# Patient Record
Sex: Female | Born: 1940
Health system: Southern US, Community
[De-identification: ages and names within clinical notes are randomized; demographics above are authoritative.]

## PROBLEM LIST (undated history)

## (undated) DIAGNOSIS — H401133 Primary open-angle glaucoma, bilateral, severe stage: Secondary | ICD-10-CM

## (undated) DIAGNOSIS — G5603 Carpal tunnel syndrome, bilateral upper limbs: Secondary | ICD-10-CM

## (undated) DIAGNOSIS — Q600 Renal agenesis, unilateral: Secondary | ICD-10-CM

## (undated) DIAGNOSIS — M353 Polymyalgia rheumatica: Secondary | ICD-10-CM

## (undated) DIAGNOSIS — K219 Gastro-esophageal reflux disease without esophagitis: Secondary | ICD-10-CM

## (undated) DIAGNOSIS — Z923 Personal history of irradiation: Secondary | ICD-10-CM

## (undated) DIAGNOSIS — N189 Chronic kidney disease, unspecified: Secondary | ICD-10-CM

## (undated) DIAGNOSIS — M1990 Unspecified osteoarthritis, unspecified site: Secondary | ICD-10-CM

## (undated) DIAGNOSIS — C801 Malignant (primary) neoplasm, unspecified: Secondary | ICD-10-CM

## (undated) DIAGNOSIS — L409 Psoriasis, unspecified: Secondary | ICD-10-CM

## (undated) DIAGNOSIS — R519 Headache, unspecified: Secondary | ICD-10-CM

## (undated) DIAGNOSIS — G4730 Sleep apnea, unspecified: Secondary | ICD-10-CM

## (undated) DIAGNOSIS — I739 Peripheral vascular disease, unspecified: Secondary | ICD-10-CM

## (undated) DIAGNOSIS — R112 Nausea with vomiting, unspecified: Secondary | ICD-10-CM

## (undated) DIAGNOSIS — E7800 Pure hypercholesterolemia, unspecified: Secondary | ICD-10-CM

## (undated) DIAGNOSIS — I10 Essential (primary) hypertension: Secondary | ICD-10-CM

## (undated) DIAGNOSIS — F419 Anxiety disorder, unspecified: Secondary | ICD-10-CM

## (undated) DIAGNOSIS — R011 Cardiac murmur, unspecified: Secondary | ICD-10-CM

## (undated) DIAGNOSIS — S81832A Puncture wound without foreign body, left lower leg, initial encounter: Secondary | ICD-10-CM

## (undated) DIAGNOSIS — R45850 Homicidal ideations: Secondary | ICD-10-CM

## (undated) DIAGNOSIS — F32A Depression, unspecified: Secondary | ICD-10-CM

## (undated) DIAGNOSIS — F329 Major depressive disorder, single episode, unspecified: Secondary | ICD-10-CM

## (undated) DIAGNOSIS — R51 Headache: Secondary | ICD-10-CM

## (undated) HISTORY — DX: Polymyalgia rheumatica: M35.3

## (undated) HISTORY — PX: APPENDECTOMY: SHX54

## (undated) HISTORY — PX: OTHER SURGICAL HISTORY: SHX169

## (undated) HISTORY — PX: ILIAC ARTERY STENT: SHX1786

## (undated) HISTORY — DX: Puncture wound without foreign body, left lower leg, initial encounter: S81.832A

## (undated) HISTORY — PX: TONSILLECTOMY: SUR1361

## (undated) HISTORY — PX: EYE SURGERY: SHX253

## (undated) HISTORY — DX: Homicidal ideations: R45.850

---

## 1961-07-10 HISTORY — PX: ABDOMINAL HYSTERECTOMY: SHX81

## 1988-07-10 HISTORY — PX: CARPAL TUNNEL RELEASE: SHX101

## 1999-05-16 ENCOUNTER — Encounter: Payer: Self-pay | Admitting: Emergency Medicine

## 1999-05-16 ENCOUNTER — Emergency Department (HOSPITAL_COMMUNITY): Admission: EM | Admit: 1999-05-16 | Discharge: 1999-05-16 | Payer: Self-pay | Admitting: Emergency Medicine

## 1999-10-04 ENCOUNTER — Emergency Department (HOSPITAL_COMMUNITY): Admission: EM | Admit: 1999-10-04 | Discharge: 1999-10-04 | Payer: Self-pay | Admitting: Emergency Medicine

## 1999-10-05 ENCOUNTER — Inpatient Hospital Stay (HOSPITAL_COMMUNITY): Admission: EM | Admit: 1999-10-05 | Discharge: 1999-10-08 | Payer: Self-pay | Admitting: Orthopedic Surgery

## 1999-10-05 ENCOUNTER — Encounter: Payer: Self-pay | Admitting: Orthopedic Surgery

## 1999-10-06 ENCOUNTER — Encounter: Payer: Self-pay | Admitting: Orthopedic Surgery

## 2001-05-30 ENCOUNTER — Encounter: Payer: Self-pay | Admitting: Emergency Medicine

## 2001-05-30 ENCOUNTER — Inpatient Hospital Stay (HOSPITAL_COMMUNITY): Admission: EM | Admit: 2001-05-30 | Discharge: 2001-06-02 | Payer: Self-pay | Admitting: Emergency Medicine

## 2004-02-09 ENCOUNTER — Emergency Department (HOSPITAL_COMMUNITY): Admission: EM | Admit: 2004-02-09 | Discharge: 2004-02-09 | Payer: Self-pay | Admitting: Emergency Medicine

## 2004-08-19 ENCOUNTER — Encounter: Admission: RE | Admit: 2004-08-19 | Discharge: 2004-08-19 | Payer: Self-pay | Admitting: Internal Medicine

## 2004-12-27 ENCOUNTER — Encounter: Admission: RE | Admit: 2004-12-27 | Discharge: 2004-12-27 | Payer: Self-pay | Admitting: Internal Medicine

## 2011-09-21 LAB — PULMONARY FUNCTION TEST

## 2012-04-09 ENCOUNTER — Other Ambulatory Visit: Payer: Self-pay | Admitting: Neurosurgery

## 2012-04-11 ENCOUNTER — Encounter (HOSPITAL_COMMUNITY)
Admission: RE | Admit: 2012-04-11 | Discharge: 2012-04-11 | Disposition: A | Discharge status: Home / Self Care | Payer: Medicare Other | Source: Ambulatory Visit | Attending: Neurosurgery | Admitting: Neurosurgery

## 2012-04-11 ENCOUNTER — Encounter (HOSPITAL_COMMUNITY): Payer: Self-pay | Admitting: Pharmacy Technician

## 2012-04-11 ENCOUNTER — Encounter (HOSPITAL_COMMUNITY): Payer: Self-pay

## 2012-04-11 HISTORY — DX: Anxiety disorder, unspecified: F41.9

## 2012-04-11 HISTORY — DX: Carpal tunnel syndrome, bilateral upper limbs: G56.03

## 2012-04-11 HISTORY — DX: Major depressive disorder, single episode, unspecified: F32.9

## 2012-04-11 HISTORY — DX: Essential (primary) hypertension: I10

## 2012-04-11 HISTORY — DX: Cardiac murmur, unspecified: R01.1

## 2012-04-11 HISTORY — DX: Depression, unspecified: F32.A

## 2012-04-11 HISTORY — DX: Chronic kidney disease, unspecified: N18.9

## 2012-04-11 HISTORY — DX: Pure hypercholesterolemia, unspecified: E78.00

## 2012-04-11 HISTORY — DX: Psoriasis, unspecified: L40.9

## 2012-04-11 HISTORY — DX: Peripheral vascular disease, unspecified: I73.9

## 2012-04-11 HISTORY — DX: Renal agenesis, unilateral: Q60.0

## 2012-04-11 HISTORY — DX: Unspecified osteoarthritis, unspecified site: M19.90

## 2012-04-11 LAB — BASIC METABOLIC PANEL
BUN: 12 mg/dL (ref 6–23)
CO2: 26 mEq/L (ref 19–32)
Calcium: 9.6 mg/dL (ref 8.4–10.5)
Chloride: 106 mEq/L (ref 96–112)
Creatinine, Ser: 1.12 mg/dL — ABNORMAL HIGH (ref 0.50–1.10)
GFR calc Af Amer: 55 mL/min — ABNORMAL LOW (ref >90)
GFR calc non Af Amer: 48 mL/min — ABNORMAL LOW (ref >90)
Glucose, Bld: 81 mg/dL (ref 70–99)
Potassium: 3.7 mEq/L (ref 3.5–5.1)
Sodium: 140 mEq/L (ref 135–145)

## 2012-04-11 LAB — CBC
HCT: 34.8 % — ABNORMAL LOW (ref 36.0–46.0)
Hemoglobin: 12.3 g/dL (ref 12.0–15.0)
MCH: 34.6 pg — ABNORMAL HIGH (ref 26.0–34.0)
MCHC: 35.3 g/dL (ref 30.0–36.0)
MCV: 97.8 fL (ref 78.0–100.0)
Platelets: 192 10*3/uL (ref 150–400)
RBC: 3.56 MIL/uL — ABNORMAL LOW (ref 3.87–5.11)
RDW: 12.5 % (ref 11.5–15.5)
WBC: 8.2 10*3/uL (ref 4.0–10.5)

## 2012-04-11 LAB — SURGICAL PCR SCREEN
MRSA, PCR: NEGATIVE
Staphylococcus aureus: NEGATIVE

## 2012-04-11 NOTE — Progress Notes (Signed)
Nurse accompanied patient to EKG room while in private and questioned patient about recent behavior health visit. Patient informed Nurse that she recently went to behavioral health for 5 days and patient denied any current sucidial ideation or abuse in the home.

## 2012-04-11 NOTE — Progress Notes (Signed)
Patient arrived however no medications are listed. Nurse called Pharmacy Tech for assist.

## 2012-04-11 NOTE — Progress Notes (Signed)
Patient called Nurse and stated she takes Metoprolol ER 25 mg at bedtime and Amlodipine 5 mg in the morning. Nurse called pharmacy tech and notified her of this and she stated she would update list.

## 2012-04-11 NOTE — Progress Notes (Signed)
Patient informed Nurse that she had a stress test a few years ago at [facility] in [facility] with Dr. Ivin Booty. Will request records. Patient denied having a cardiac cath or sleep study. PCP is Dr. Renold Don and his Physician Assistance is Stephannie Peters in [place]. LOV was last week, will request records. Pharmacy Tech updated medication list however patient informed Nurse that she takes two blood pressure medications that were not listed. Nurse called Dr Marcha Solders office however they were closed for lunch. Nurse provided patient with preadmission number and instructed her to call after arriving home so she could be instructed on what medications to take the morning of surgery. Patient verbalized understanding. Home aid was also at bedside.

## 2012-04-11 NOTE — Pre-Procedure Instructions (Signed)
[address] [name] [name]  04/11/2012   Your procedure is scheduled on:  Friday April 19, 2012.  Report to [facility] at 1000 AM.  Call this number if you have problems the morning of surgery: [phone]   Remember:   Do not eat food or drink:After Midnight.    Take these medicines the morning of surgery with A SIP OF WATER: Tylenol if needed for pain, Albuterol inhaler if needed for wheezing, Alprazolam (Xanax) if needed for anxiety, Ariprprazole (Abilify) Clonazepam (Klonopin) if needed for anxiety, Hydrocodone (Vicodin) if needed for pain, Sertraline (Zoloft), Topriamate (Topamax), and Tramadol (Ultram) if needed for pain  Discontinue aspirin, Plavix, and herbal medications 7 days prior to surgery 04/12/12   Do not wear jewelry, make-up or nail polish.  Do not wear lotions, powders, or perfumes.   Do not shave 48 hours prior to surgery.  Do not bring valuables to the hospital.  Contacts, dentures or bridgework may not be worn into surgery.  Leave suitcase in the car. After surgery it may be brought to your room.  For patients admitted to the hospital, checkout time is 11:00 AM the day of discharge.   Patients discharged the day of surgery will not be allowed to drive home.  Name and phone number of your driver:   Special Instructions: Shower using CHG 2 nights before surgery and the night before surgery.  If you shower the day of surgery use CHG.  Use special wash - you have one bottle of CHG for all showers.  You should use approximately 1/3 of the bottle for each shower.   Please read over the following fact sheets that you were given: Pain Booklet, Coughing and Deep Breathing, MRSA Information and Surgical Site Infection Prevention

## 2012-04-18 MED ORDER — CEFAZOLIN SODIUM-DEXTROSE 2-3 GM-% IV SOLR
2.0000 g | INTRAVENOUS | Status: AC
  Administered 2012-04-19: 2 g via INTRAVENOUS
  Filled 2012-04-18: qty 50

## 2012-04-19 ENCOUNTER — Encounter (HOSPITAL_COMMUNITY)
Admission: RE | Disposition: A | Discharge status: Home / Self Care | Payer: Self-pay | Source: Ambulatory Visit | Attending: Neurosurgery

## 2012-04-19 ENCOUNTER — Encounter (HOSPITAL_COMMUNITY): Payer: Self-pay | Admitting: Vascular Surgery

## 2012-04-19 ENCOUNTER — Ambulatory Visit (HOSPITAL_COMMUNITY): Payer: Medicare Other | Admitting: *Deleted

## 2012-04-19 ENCOUNTER — Ambulatory Visit (HOSPITAL_COMMUNITY): Payer: Medicare Other

## 2012-04-19 ENCOUNTER — Encounter (HOSPITAL_COMMUNITY): Payer: Self-pay | Admitting: *Deleted

## 2012-04-19 ENCOUNTER — Inpatient Hospital Stay (HOSPITAL_COMMUNITY)
Admission: RE | Admit: 2012-04-19 | Discharge: 2012-04-20 | DRG: 472 | Disposition: A | Discharge status: Home / Self Care | Payer: Medicare Other | Source: Ambulatory Visit | Attending: Neurosurgery | Admitting: Neurosurgery

## 2012-04-19 DIAGNOSIS — F329 Major depressive disorder, single episode, unspecified: Secondary | ICD-10-CM | POA: Diagnosis present

## 2012-04-19 DIAGNOSIS — Z87891 Personal history of nicotine dependence: Secondary | ICD-10-CM

## 2012-04-19 DIAGNOSIS — F3289 Other specified depressive episodes: Secondary | ICD-10-CM | POA: Diagnosis present

## 2012-04-19 DIAGNOSIS — K219 Gastro-esophageal reflux disease without esophagitis: Secondary | ICD-10-CM | POA: Diagnosis present

## 2012-04-19 DIAGNOSIS — M4712 Other spondylosis with myelopathy, cervical region: Secondary | ICD-10-CM | POA: Diagnosis present

## 2012-04-19 DIAGNOSIS — Z01812 Encounter for preprocedural laboratory examination: Secondary | ICD-10-CM

## 2012-04-19 DIAGNOSIS — I10 Essential (primary) hypertension: Secondary | ICD-10-CM | POA: Diagnosis present

## 2012-04-19 DIAGNOSIS — M4722 Other spondylosis with radiculopathy, cervical region: Secondary | ICD-10-CM | POA: Diagnosis present

## 2012-04-19 DIAGNOSIS — F411 Generalized anxiety disorder: Secondary | ICD-10-CM | POA: Diagnosis present

## 2012-04-19 DIAGNOSIS — M5000 Cervical disc disorder with myelopathy, unspecified cervical region: Principal | ICD-10-CM | POA: Diagnosis present

## 2012-04-19 HISTORY — PX: CERVICAL FUSION: SHX112

## 2012-04-19 HISTORY — PX: ANTERIOR CERVICAL DECOMP/DISCECTOMY FUSION: SHX1161

## 2012-04-19 SURGERY — ANTERIOR CERVICAL DECOMPRESSION/DISCECTOMY FUSION 2 LEVELS
Anesthesia: General | Site: Spine Cervical | Wound class: Clean

## 2012-04-19 MED ORDER — SERTRALINE HCL 100 MG PO TABS
100.0000 mg | ORAL_TABLET | Freq: Every day | ORAL | Status: DC
  Filled 2012-04-19 (×2): qty 1

## 2012-04-19 MED ORDER — GLYCOPYRROLATE 0.2 MG/ML IJ SOLN
INTRAMUSCULAR | Status: DC | PRN
  Administered 2012-04-19: .8 mg via INTRAVENOUS

## 2012-04-19 MED ORDER — ACETAMINOPHEN 650 MG RE SUPP: 650.0000 mg | RECTAL | Status: DC | PRN

## 2012-04-19 MED ORDER — HYDROMORPHONE HCL PF 1 MG/ML IJ SOLN
0.2500 mg | INTRAMUSCULAR | Status: DC | PRN
  Administered 2012-04-19 (×2): 0.5 mg via INTRAVENOUS

## 2012-04-19 MED ORDER — MEPERIDINE HCL 25 MG/ML IJ SOLN: 6.2500 mg | INTRAMUSCULAR | Status: DC | PRN

## 2012-04-19 MED ORDER — AMLODIPINE BESYLATE 5 MG PO TABS
5.0000 mg | ORAL_TABLET | Freq: Two times a day (BID) | ORAL | Status: DC
  Filled 2012-04-19 (×3): qty 1

## 2012-04-19 MED ORDER — POLYVINYL ALCOHOL 1.4 % OP SOLN
1.0000 [drp] | Freq: Three times a day (TID) | OPHTHALMIC | Status: DC | PRN
  Administered 2012-04-19: 1 [drp] via OPHTHALMIC
  Filled 2012-04-19: qty 15

## 2012-04-19 MED ORDER — ARIPIPRAZOLE 2 MG PO TABS
2.0000 mg | ORAL_TABLET | Freq: Every day | ORAL | Status: DC
  Administered 2012-04-19: 2 mg via ORAL
  Filled 2012-04-19 (×3): qty 1

## 2012-04-19 MED ORDER — THROMBIN 5000 UNITS EX SOLR: CUTANEOUS | Status: DC | PRN

## 2012-04-19 MED ORDER — SODIUM CHLORIDE 0.9 % IJ SOLN: 3.0000 mL | Freq: Two times a day (BID) | INTRAMUSCULAR | Status: DC

## 2012-04-19 MED ORDER — PHENOL 1.4 % MT LIQD: 1.0000 | OROMUCOSAL | Status: DC | PRN

## 2012-04-19 MED ORDER — MENTHOL 3 MG MT LOZG: 1.0000 | LOZENGE | OROMUCOSAL | Status: DC | PRN

## 2012-04-19 MED ORDER — MIDAZOLAM HCL 2 MG/2ML IJ SOLN: 0.5000 mg | Freq: Once | INTRAMUSCULAR | Status: DC | PRN

## 2012-04-19 MED ORDER — LACTATED RINGERS IV SOLN
INTRAVENOUS | Status: DC | PRN
  Administered 2012-04-19: 12:00:00 via INTRAVENOUS

## 2012-04-19 MED ORDER — ACETAMINOPHEN 325 MG PO TABS: 650.0000 mg | ORAL_TABLET | ORAL | Status: DC | PRN

## 2012-04-19 MED ORDER — MORPHINE SULFATE 2 MG/ML IJ SOLN: 1.0000 mg | INTRAMUSCULAR | Status: DC | PRN

## 2012-04-19 MED ORDER — ALPRAZOLAM 0.25 MG PO TABS
0.2500 mg | ORAL_TABLET | Freq: Three times a day (TID) | ORAL | Status: DC | PRN
  Administered 2012-04-19: 0.25 mg via ORAL
  Filled 2012-04-19: qty 1

## 2012-04-19 MED ORDER — HYPROMELLOSE (GONIOSCOPIC) 2.5 % OP SOLN
1.0000 [drp] | Freq: Three times a day (TID) | OPHTHALMIC | Status: DC | PRN
  Filled 2012-04-19: qty 15

## 2012-04-19 MED ORDER — SODIUM CHLORIDE 0.9 % IJ SOLN: 3.0000 mL | INTRAMUSCULAR | Status: DC | PRN

## 2012-04-19 MED ORDER — NEOSTIGMINE METHYLSULFATE 1 MG/ML IJ SOLN
INTRAMUSCULAR | Status: DC | PRN
  Administered 2012-04-19: 4 mg via INTRAVENOUS

## 2012-04-19 MED ORDER — MIDAZOLAM HCL 5 MG/5ML IJ SOLN
INTRAMUSCULAR | Status: DC | PRN
  Administered 2012-04-19 (×2): 1 mg via INTRAVENOUS

## 2012-04-19 MED ORDER — THROMBIN 5000 UNITS EX KIT
PACK | CUTANEOUS | Status: DC | PRN
  Administered 2012-04-19 (×2): 5000 [IU] via TOPICAL

## 2012-04-19 MED ORDER — METOPROLOL SUCCINATE ER 25 MG PO TB24
25.0000 mg | ORAL_TABLET | Freq: Every day | ORAL | Status: DC
  Filled 2012-04-19 (×2): qty 1

## 2012-04-19 MED ORDER — FENTANYL CITRATE 0.05 MG/ML IJ SOLN
INTRAMUSCULAR | Status: DC | PRN
  Administered 2012-04-19: 150 ug via INTRAVENOUS
  Administered 2012-04-19 (×3): 50 ug via INTRAVENOUS

## 2012-04-19 MED ORDER — OXYCODONE HCL 5 MG/5ML PO SOLN: 5.0000 mg | Freq: Once | ORAL | Status: AC | PRN

## 2012-04-19 MED ORDER — ADULT MULTIVITAMIN W/MINERALS CH
1.0000 | ORAL_TABLET | Freq: Every day | ORAL | Status: DC
  Filled 2012-04-19 (×2): qty 1

## 2012-04-19 MED ORDER — ZOLPIDEM TARTRATE 5 MG PO TABS: 5.0000 mg | ORAL_TABLET | Freq: Every evening | ORAL | Status: DC | PRN

## 2012-04-19 MED ORDER — POLYETHYLENE GLYCOL 3350 17 G PO PACK
17.0000 g | PACK | Freq: Every day | ORAL | Status: DC
  Administered 2012-04-19: 17 g via ORAL
  Filled 2012-04-19 (×2): qty 1

## 2012-04-19 MED ORDER — CLONAZEPAM 0.5 MG PO TABS: 0.5000 mg | ORAL_TABLET | Freq: Two times a day (BID) | ORAL | Status: DC | PRN

## 2012-04-19 MED ORDER — HEMOSTATIC AGENTS (NO CHARGE) OPTIME
TOPICAL | Status: DC | PRN
  Administered 2012-04-19: 1 via TOPICAL

## 2012-04-19 MED ORDER — PROPOFOL 10 MG/ML IV BOLUS
INTRAVENOUS | Status: DC | PRN
  Administered 2012-04-19: 10 mg via INTRAVENOUS
  Administered 2012-04-19: 90 mg via INTRAVENOUS

## 2012-04-19 MED ORDER — ACETAMINOPHEN 500 MG PO TABS
1000.0000 mg | ORAL_TABLET | Freq: Once | ORAL | Status: AC
  Administered 2012-04-19: 1000 mg via ORAL
  Filled 2012-04-19: qty 2

## 2012-04-19 MED ORDER — 0.9 % SODIUM CHLORIDE (POUR BTL) OPTIME
TOPICAL | Status: DC | PRN
  Administered 2012-04-19: 1000 mL

## 2012-04-19 MED ORDER — LIDOCAINE-EPINEPHRINE 0.5 %-1:200000 IJ SOLN
INTRAMUSCULAR | Status: DC | PRN
  Administered 2012-04-19: 2 mL

## 2012-04-19 MED ORDER — HYDROMORPHONE HCL PF 1 MG/ML IJ SOLN
INTRAMUSCULAR | Status: AC
  Filled 2012-04-19: qty 1

## 2012-04-19 MED ORDER — ROCURONIUM BROMIDE 100 MG/10ML IV SOLN
INTRAVENOUS | Status: DC | PRN
  Administered 2012-04-19: 50 mg via INTRAVENOUS

## 2012-04-19 MED ORDER — ACETAMINOPHEN 10 MG/ML IV SOLN
1000.0000 mg | Freq: Four times a day (QID) | INTRAVENOUS | Status: DC
  Administered 2012-04-19 – 2012-04-20 (×3): 1000 mg via INTRAVENOUS
  Filled 2012-04-19 (×4): qty 100

## 2012-04-19 MED ORDER — TRAMADOL HCL 50 MG PO TABS
50.0000 mg | ORAL_TABLET | Freq: Four times a day (QID) | ORAL | Status: DC | PRN
  Administered 2012-04-19: 50 mg via ORAL
  Filled 2012-04-19: qty 1

## 2012-04-19 MED ORDER — PROMETHAZINE HCL 25 MG/ML IJ SOLN: 6.2500 mg | INTRAMUSCULAR | Status: DC | PRN

## 2012-04-19 MED ORDER — POTASSIUM CHLORIDE IN NACL 20-0.9 MEQ/L-% IV SOLN
INTRAVENOUS | Status: DC
  Filled 2012-04-19 (×3): qty 1000

## 2012-04-19 MED ORDER — LIDOCAINE HCL (CARDIAC) 20 MG/ML IV SOLN
INTRAVENOUS | Status: DC | PRN
  Administered 2012-04-19: 30 mg via INTRAVENOUS

## 2012-04-19 MED ORDER — ONDANSETRON HCL 4 MG/2ML IJ SOLN
INTRAMUSCULAR | Status: DC | PRN
  Administered 2012-04-19: 4 mg via INTRAVENOUS

## 2012-04-19 MED ORDER — ONDANSETRON HCL 4 MG/2ML IJ SOLN: 4.0000 mg | INTRAMUSCULAR | Status: DC | PRN

## 2012-04-19 MED ORDER — DEPLIN 15 MG PO TABS: 15.0000 mg | ORAL_TABLET | Freq: Every day | ORAL | Status: DC

## 2012-04-19 MED ORDER — ALBUTEROL SULFATE HFA 108 (90 BASE) MCG/ACT IN AERS
2.0000 | INHALATION_SPRAY | RESPIRATORY_TRACT | Status: DC | PRN
  Filled 2012-04-19: qty 6.7

## 2012-04-19 MED ORDER — OXYCODONE HCL 5 MG PO TABS
ORAL_TABLET | ORAL | Status: AC
  Filled 2012-04-19: qty 1

## 2012-04-19 MED ORDER — HYDROCODONE-ACETAMINOPHEN 5-325 MG PO TABS: 1.0000 | ORAL_TABLET | ORAL | Status: DC | PRN

## 2012-04-19 MED ORDER — OXYCODONE HCL 5 MG PO TABS
5.0000 mg | ORAL_TABLET | Freq: Once | ORAL | Status: AC | PRN
  Administered 2012-04-19: 5 mg via ORAL

## 2012-04-19 MED ORDER — TOPIRAMATE 25 MG PO TABS
50.0000 mg | ORAL_TABLET | Freq: Two times a day (BID) | ORAL | Status: DC
  Administered 2012-04-19: 50 mg via ORAL
  Filled 2012-04-19 (×3): qty 2

## 2012-04-19 MED ORDER — CYCLOBENZAPRINE HCL 10 MG PO TABS: 10.0000 mg | ORAL_TABLET | Freq: Three times a day (TID) | ORAL | Status: DC | PRN

## 2012-04-19 SURGICAL SUPPLY — 78 items
BANDAGE GAUZE ELAST BULKY 4 IN (GAUZE/BANDAGES/DRESSINGS) IMPLANT
BIT DRILL NEURO 2X3.1 SFT TUCH (MISCELLANEOUS) ×1 IMPLANT
BUR DRUM 4.0 (BURR) ×2 IMPLANT
CANISTER SUCTION 2500CC (MISCELLANEOUS) ×2 IMPLANT
CLOTH BEACON ORANGE TIMEOUT ST (SAFETY) ×2 IMPLANT
CONT SPEC 4OZ CLIKSEAL STRL BL (MISCELLANEOUS) ×2 IMPLANT
DECANTER SPIKE VIAL GLASS SM (MISCELLANEOUS) ×2 IMPLANT
DERMABOND ADHESIVE PROPEN (GAUZE/BANDAGES/DRESSINGS) ×1
DERMABOND ADVANCED (GAUZE/BANDAGES/DRESSINGS)
DERMABOND ADVANCED .7 DNX12 (GAUZE/BANDAGES/DRESSINGS) IMPLANT
DERMABOND ADVANCED .7 DNX6 (GAUZE/BANDAGES/DRESSINGS) ×1 IMPLANT
DRAPE LAPAROTOMY 100X72 PEDS (DRAPES) ×2 IMPLANT
DRAPE MICROSCOPE LEICA (MISCELLANEOUS) ×2 IMPLANT
DRAPE POUCH INSTRU U-SHP 10X18 (DRAPES) ×2 IMPLANT
DRAPE PROXIMA HALF (DRAPES) IMPLANT
DRILL NEURO 2X3.1 SOFT TOUCH (MISCELLANEOUS) ×2
DURAPREP 6ML APPLICATOR 50/CS (WOUND CARE) ×2 IMPLANT
ELECT COATED BLADE 2.86 ST (ELECTRODE) ×2 IMPLANT
ELECT REM PT RETURN 9FT ADLT (ELECTROSURGICAL) ×2
ELECTRODE REM PT RTRN 9FT ADLT (ELECTROSURGICAL) ×1 IMPLANT
GAUZE SPONGE 4X4 16PLY XRAY LF (GAUZE/BANDAGES/DRESSINGS) IMPLANT
GLOVE BIO SURGEON STRL SZ 6.5 (GLOVE) ×4 IMPLANT
GLOVE BIO SURGEON STRL SZ7 (GLOVE) IMPLANT
GLOVE BIO SURGEON STRL SZ7.5 (GLOVE) IMPLANT
GLOVE BIO SURGEON STRL SZ8 (GLOVE) ×2 IMPLANT
GLOVE BIO SURGEON STRL SZ8.5 (GLOVE) IMPLANT
GLOVE BIOGEL M 8.0 STRL (GLOVE) IMPLANT
GLOVE BIOGEL PI IND STRL 6.5 (GLOVE) ×1 IMPLANT
GLOVE BIOGEL PI IND STRL 7.5 (GLOVE) ×1 IMPLANT
GLOVE BIOGEL PI IND STRL 8.5 (GLOVE) ×1 IMPLANT
GLOVE BIOGEL PI INDICATOR 6.5 (GLOVE) ×1
GLOVE BIOGEL PI INDICATOR 7.5 (GLOVE) ×1
GLOVE BIOGEL PI INDICATOR 8.5 (GLOVE) ×1
GLOVE ECLIPSE 6.5 STRL STRAW (GLOVE) ×4 IMPLANT
GLOVE ECLIPSE 7.0 STRL STRAW (GLOVE) IMPLANT
GLOVE ECLIPSE 7.5 STRL STRAW (GLOVE) IMPLANT
GLOVE ECLIPSE 8.0 STRL XLNG CF (GLOVE) IMPLANT
GLOVE ECLIPSE 8.5 STRL (GLOVE) IMPLANT
GLOVE EXAM NITRILE LRG STRL (GLOVE) IMPLANT
GLOVE EXAM NITRILE MD LF STRL (GLOVE) IMPLANT
GLOVE EXAM NITRILE XL STR (GLOVE) IMPLANT
GLOVE EXAM NITRILE XS STR PU (GLOVE) IMPLANT
GLOVE INDICATOR 6.5 STRL GRN (GLOVE) IMPLANT
GLOVE INDICATOR 7.0 STRL GRN (GLOVE) IMPLANT
GLOVE INDICATOR 7.5 STRL GRN (GLOVE) IMPLANT
GLOVE INDICATOR 8.0 STRL GRN (GLOVE) IMPLANT
GLOVE INDICATOR 8.5 STRL (GLOVE) IMPLANT
GLOVE OPTIFIT SS 8.0 STRL (GLOVE) IMPLANT
GLOVE SURG SS PI 6.5 STRL IVOR (GLOVE) ×4 IMPLANT
GOWN BRE IMP SLV AUR LG STRL (GOWN DISPOSABLE) ×6 IMPLANT
GOWN BRE IMP SLV AUR XL STRL (GOWN DISPOSABLE) ×2 IMPLANT
GOWN STRL REIN 2XL LVL4 (GOWN DISPOSABLE) IMPLANT
KIT BASIN OR (CUSTOM PROCEDURE TRAY) ×2 IMPLANT
KIT ROOM TURNOVER OR (KITS) ×2 IMPLANT
NEEDLE HYPO 25X1 1.5 SAFETY (NEEDLE) ×2 IMPLANT
NEEDLE SPNL 22GX3.5 QUINCKE BK (NEEDLE) ×4 IMPLANT
NS IRRIG 1000ML POUR BTL (IV SOLUTION) ×2 IMPLANT
PACK LAMINECTOMY NEURO (CUSTOM PROCEDURE TRAY) ×2 IMPLANT
PAD ARMBOARD 7.5X6 YLW CONV (MISCELLANEOUS) ×6 IMPLANT
PIN DISTRACTION 14MM (PIN) ×4 IMPLANT
PLATE 2 35XLCK NS SPNE CVD (Plate) ×1 IMPLANT
PLATE 2 ATLANTIS TRANS (Plate) ×1 IMPLANT
RUBBERBAND STERILE (MISCELLANEOUS) ×4 IMPLANT
SCREW ST 13X4.5XST VAR NS (Screw) ×1 IMPLANT
SCREW ST 13X4XST VA NS SPNE (Screw) ×3 IMPLANT
SCREW ST VAR 4 ATL (Screw) ×3 IMPLANT
SCREW ST VAR 4.5 ATL (Screw) ×1 IMPLANT
SPACER ACF PARALLEL 7MM (Bone Implant) ×2 IMPLANT
SPACER PARALLEL 6MM CC ACF (Bone Implant) ×2 IMPLANT
SPONGE INTESTINAL PEANUT (DISPOSABLE) ×2 IMPLANT
SPONGE SURGIFOAM ABS GEL SZ50 (HEMOSTASIS) ×2 IMPLANT
SUT VIC AB 0 CT1 27 (SUTURE)
SUT VIC AB 0 CT1 27XBRD ANTBC (SUTURE) IMPLANT
SUT VIC AB 3-0 SH 8-18 (SUTURE) ×4 IMPLANT
SYR 20ML ECCENTRIC (SYRINGE) ×2 IMPLANT
TOWEL OR 17X24 6PK STRL BLUE (TOWEL DISPOSABLE) ×2 IMPLANT
TOWEL OR 17X26 10 PK STRL BLUE (TOWEL DISPOSABLE) ×2 IMPLANT
WATER STERILE IRR 1000ML POUR (IV SOLUTION) ×2 IMPLANT

## 2012-04-19 NOTE — Op Note (Signed)
04/19/2012  6:30 PM  PATIENT:  [name] [name]  71 y.o. female  PRE-OPERATIVE DIAGNOSIS:  cervical spondylosis neck pain  POST-OPERATIVE DIAGNOSIS:  cervical spondylosis neck pain  PROCEDURE:  Procedure(s): ANTERIOR CERVICAL DECOMPRESSION/DISCECTOMY FUSION 2 LEVELS Arthrodesis C4-5, C5-6 Structural allograft 6 mm at C4-5, structural allograft C6-7 7 mm Anterior instrumentation Atlantis translational plate  SURGEON:  Surgeon(s): Carmela Hurt, MD Maeola Harman, MD  ASSISTANTS:Stern, joseph  ANESTHESIA:   general  EBL:  Total I/O In: 1600 [I.V.:1600] Out: 150 [Blood:150]  BLOOD ADMINISTERED:none  CELL SAVER GIVEN:none   COUNT:per nursing  DRAINS: none   SPECIMEN:  No Specimen  DICTATION: [name] [name] was taken to the operating room, intubated and placed under a general anesthetic without difficulty. She was positioned on a horseshoe headrest with her head in slight extension. 2 cc half percent lidocaine into the cervical region. I started from the midline extended Her neck was prepped and she was draped in a sterile fashion. I infiltrated 2 cc 1/2% lidocaine into the cervical region. I started from the midline and takes 10 blade the incision to the medial border of the left sternocleidomastoid muscle. I opened the subcutaneous tissue with Metzenbaum scissors. I dissected and the plane superior to the platysma muscle rostrally and caudally with Metzenbaum scissors.  I opened the platysma in a horizontal fashion using the scissors. I then dissected inferior to the platysma both rostrally and caudally with the Metzenbaums. I created an avascular corridor to the cervical spine retracting the strap muscles medially and the carotid artery laterally. I identified the cervical spine and used a peanut to remove soft tissue. I placed a spinal needle into the disc space that I had exposed. X-ray showed that I was at the C5-6 space. I reflected the longus coli muscles bilaterally. I placed a  self-retaining retractor.  I opened the disc spaces using a 15 blade and started my discectomy.  I decompressed the spinal canal at C5-6 by using curettes pituitary rongeurs Kerrison punches and a high-speed drill to remove the disc endplates and osteophytes. I did this until I reached the posterior longitudinal ligament. I remove the posterior longitudinal ligament with Kerrison punches. I then decompressed the spinal canal fossil and caudally with the punches. I was able to fully decompressed both C6 nerve roots. I want him to remove most of the uncovertebral joints bilaterally. Satisfied with the decompression. I then sized the space. I use a drill to even the surfaces of both C5 and C6. I felt a 7 mm structural allograft would work best. I placed that allograft without difficulty. I then turned my attention to the C4-5 level.  I decompressed the spinal canal at C4-5 in the same fashion as of 5/6 level. I used curettes pituitary rongeurs Kerrison punches and drill to remove osteophyte disc material she will joints bilaterally. I was very aggressive bilaterally over the C5 roots. I decompressed both of those and the spinal canal quite thoroughly.  I sized the space and felt a 6 mm graft would work best. I placed a 6 mm structural allograft into the disc space. I used the drill to prepare the surfaces of C5 and C4 to accept the graft I removed osteophytes and levelled  both surfaces. The graft fit quite well. With Dr. stones assistants I placed the anterior instrumentation. I used a [employer] Atlantis plate. I placed 2 screws into C4 and 2 screws in to see 6. There was very little at the C5 I did not  want to listen placing the screw into the amount of bone that was remaining.  I irrigated the wound. X-ray showed the plate plugging screws to be in good position. I reapproximated the platysma with Vicryl sutures. I approximated the subcuticular layer with Vicryl sutures. I used Dermabond for a sterile  dressing. She was subsequently extubated.  PLAN OF CARE: Admit to inpatient   PATIENT DISPOSITION:  PACU - hemodynamically stable.   Delay start of Pharmacological VTE agent (>24hrs) due to surgical blood loss or risk of bleeding:  yes

## 2012-04-19 NOTE — Preoperative (Signed)
Beta Blockers   Reason not to administer Beta Blockers:Pt took Toprol XL this am 800

## 2012-04-19 NOTE — Anesthesia Postprocedure Evaluation (Signed)
  Anesthesia Post-op Note  Patient: [name] [name]  Procedure(s) Performed: Procedure(s) (LRB) with comments: ANTERIOR CERVICAL DECOMPRESSION/DISCECTOMY FUSION 2 LEVELS (N/A) - Cervical four-five,Cervical five-six anterior cervical decompression with fusion plating and bonegraft possible posterior cervical decompression  Patient Location: PACU  Anesthesia Type: General  Level of Consciousness: awake  Airway and Oxygen Therapy: Patient Spontanous Breathing  Post-op Pain: mild  Post-op Assessment: Post-op Vital signs reviewed, Patient's Cardiovascular Status Stable, Respiratory Function Stable, Patent Airway, No signs of Nausea or vomiting and Pain level controlled  Post-op Vital Signs: stable  Complications: No apparent anesthesia complications

## 2012-04-19 NOTE — Anesthesia Preprocedure Evaluation (Signed)
Anesthesia Evaluation   Patient awake    Reviewed: Allergy & Precautions, H&P , NPO status , Patient's Chart, lab work & pertinent test results  History of Anesthesia Complications Negative for: history of anesthetic complications  Airway Mallampati: I TM Distance: >3 FB Neck ROM: Full    Dental  (+) Edentulous Upper and Edentulous Lower   Pulmonary neg pneumonia -, former smoker (quit 1 year ago),  breath sounds clear to auscultation  Pulmonary exam normal       Cardiovascular hypertension, Pt. on medications and Pt. on home beta blockers + Peripheral Vascular Disease (iliac stent, off plavix for a week) - Valvular Problems/MurmursRhythm:Regular Rate:Normal     Neuro/Psych  Headaches (neck pain, headaches), PSYCHIATRIC DISORDERS Anxiety Depression    GI/Hepatic negative GI ROS, Neg liver ROS, GERD-  Controlled,  Endo/Other  negative endocrine ROS  Renal/GU Renal disease     Musculoskeletal   Abdominal   Peds  Hematology   Anesthesia Other Findings   Reproductive/Obstetrics                           Anesthesia Physical Anesthesia Plan  ASA: III  Anesthesia Plan: General   Post-op Pain Management:    Induction: Intravenous  Airway Management Planned: Oral ETT  Additional Equipment:   Intra-op Plan:   Post-operative Plan: Extubation in OR  Informed Consent: I have reviewed the patients History and Physical, chart, labs and discussed the procedure including the risks, benefits and alternatives for the proposed anesthesia with the patient or authorized representative who has indicated his/her understanding and acceptance.     Plan Discussed with: CRNA and Surgeon  Anesthesia Plan Comments: (Plan routine monitors, GETA)        Anesthesia Quick Evaluation

## 2012-04-19 NOTE — Transfer of Care (Signed)
Immediate Anesthesia Transfer of Care Note  Patient: [name] [name]  Procedure(s) Performed: Procedure(s) (LRB) with comments: ANTERIOR CERVICAL DECOMPRESSION/DISCECTOMY FUSION 2 LEVELS (N/A) - Cervical four-five,Cervical five-six anterior cervical decompression with fusion plating and bonegraft possible posterior cervical decompression  Patient Location: PACU  Anesthesia Type: General  Level of Consciousness: awake, alert , oriented and patient cooperative  Airway & Oxygen Therapy: Patient Spontanous Breathing and Patient connected to nasal cannula oxygen  Post-op Assessment: Report given to PACU RN, Post -op Vital signs reviewed and stable and Patient moving all extremities X 4  Post vital signs: Reviewed and stable  Complications: No apparent anesthesia complications

## 2012-04-19 NOTE — Progress Notes (Addendum)
Re requested Stress test, OV note and EKG from PCP Dr Synetta Fail [phone]   Note by J. Kayah Hecker rn .[facility] Derrell [facility] called to say they have the information on Ms. Broussard however they cannot fax it to [facility] ... It will need to be requested from the [facility].      Called [facility] to request records... Pt has not been to the office for follow up since 2012

## 2012-04-19 NOTE — H&P (Signed)
BP 127/63  Pulse 70  Temp 98.1 F (36.7 C) (Oral)  Resp 18  SpO2 100%  that she has in her neck and she describes it as severe.  She has had this pain now for approximately 2 years.  She has found it very hard to sleep.  She says there is some question of whether or not she has nerve damage in the left upper extremity.  She reports that turning her head to the left is very painful.  She has been treated with anti-inflammatories, a Diclofenac patch without any real relief.  She has had Lyrica in the past, but had a reaction, which was described as allergic to it in the past medical records.  She has an MRI which by report shows significant spondylitic change.  Unfortunately, that MRI is not available today and I will thus not be able to provide her with final recommendations.    She has had no bowel or bladder dysfunction as a result of this.  She is 71 years of age and right-handed.  She says that she has actually had pain in the neck for 5 to 6 years, again, severe for the last 2.  Weakness in her arm, numbness and tingling in the left upper extremity.    PAST MEDICAL HISTORY:  Significant for hypertension.    FAMILY HISTORY:    Mother died, I believe, at age 6, fair health, had heart disease, heart failure.  Father died at age 70.  He had a myocardial infarction.    PAST SURGICAL HISTORY:  She has undergone a vascular procedure and, I believe, a cataract removal.    DRUG ALLERGIES:   She reports having reactions to Gabapentin, Hydrocodone, Lyrica, Niaspan, NSAIDs, Statins, WelChol, Zetia.  She says the NSAIDs she is not supposed to take because of renal insufficiency, so that clearly is not an allergy.  She didn't list these on her sheet.  This came with, again, the old medical information.    SOCIAL HISTORY:    She doesn't smoke, doesn't use alcohol, doesn't use illicit drugs.  She reports being 5' tall and weighing 120 lbs.    REVIEW OF SYSTEMS:   Positive for night sweats, hearing loss,  ear pain, tinnitus, hypertension, heart murmur, hypercholesterolemia, swelling in the feet, leg pain with walking, broken bones, arm weakness, leg weakness, back pain, arm pain, joint pain, arthritis,    neck pain, anxiety and depression.  Denies allergic, hematologic, endocrine, neurological problems.  She does have a skin disease, but I cannot read her writing.  She denies genitourinary, gastrointestinal, respiratory or eye problems besides the cataracts I would assume.  She has osteoporosis and arthritis in all of her joints.    MEDICATIONS:    Niaspan, Clonazepam, Alprazolam, Amlodipine, Calcium, Clopidogrel, Fenofibrate, Magnesium, Metoprolol, Pantoprazole, ProAir, Sertraline, Voltaren Cream, Tramadol.     EXAMINATION:    She is alert, oriented x 4 and answering all questions appropriately.  She has decreased pinprick in the left V2 and V3 distributions.  Normal in the left V1.  Normal on the right side.  Pupils are equal, round and reactive to light.  Full extraocular movements.  Full visual fields.  Hearing intact to finger rub bilaterally.  Uvula elevates in the midline.  Shoulder shrug is normal.  Tongue protrudes in the midline.  5/5 strength upper and lower extremities.  Normal muscle tone, bulk and coordination.  No clonus.  No Hoffmann's sign.  Toes are downgoing to plantar stimulation.  Strength -  weak in the shoulder.  Lower extremities - she essentially normal strength in the lower extremities.  Proprioception is intact in the upper and lower extremities.    Looking at the medical records, there are some things I left out, though she did not place these things on the sheet.  She has had carpal tunnel syndrome, chronic kidney disease and absence of the left kidney, diastolic dysfunction, diverticulosis, esophageal reflux, benign essential tremor, hyperlipidemia, macrocytosis, migraine, osteoarthritis, osteoporosis, psoriasis, peripheral vascular disease.    Ms. Gaster returns today with the  MRI and herself.  She has degenerative changes at 3-4, 4-5, 5-6 and 6-7.  Because she has so much arm pain, in one sense I would like to do this anteriorly as to know that I can decompress the nerve roots.  She talks about pain mainly in her shoulders and some in the arms.  4-5 and 5-6 are certainly worse than 3-4 and 6-7, but she does have spondylitic and degenerative changes in the cervical spine at all four of those levels.  It may also be that a posterior approach, which has a much better chance of healing if I were to do four levels, might be the way to go in this situation.  I am going to consider this option.  I think at this point I am leaning more towards the anterior approach, but as I told Ms. Fanara, I will certainly let her know before the operation.  We spent more than 20 minutes face-to-face going over this operation and reviewing her studies.  She understanding the risks of fusion failure, hardware failure, no pain relief, damage to the spinal cord, damage to the nerve roots, weakness, bowel or bladder dysfunction were discussed and she wishes to proceed.

## 2012-04-19 NOTE — Plan of Care (Signed)
Problem: Consults Goal: Diagnosis - Spinal Surgery Outcome: Completed/Met Date Met:  04/19/12 Cervical Spine Fusion     

## 2012-04-20 MED ORDER — HYDROCODONE-ACETAMINOPHEN 5-325 MG PO TABS: 1.0000 | ORAL_TABLET | ORAL | Status: DC | PRN

## 2012-04-20 NOTE — Progress Notes (Signed)
Utilization review completed.  

## 2012-04-20 NOTE — Progress Notes (Signed)
Physician Discharge Summary  Patient ID: [name] [name] MRN: [id number] DOB/AGE: [birth year] 71 y.o.  Admit date: 04/19/2012 Discharge date: 04/20/2012  Admission Diagnoses:Herniated cervical disc with myelopathy   Discharge Diagnoses: Same Principal Problem:  *Cervical spondylosis with radiculopathy   Discharged Condition: good  Hospital Course: Uncomplicated ACDF C45 and C56  Consults: None  Significant Diagnostic Studies: None  Treatments: surgery: Uncomplicated ACDF C45 and C56  Discharge Exam: Blood pressure 143/75, pulse 81, temperature 98.1 F (36.7 C), temperature source Oral, resp. rate 18, SpO2 85.00%. Neurologic: Alert and oriented X 3, normal strength and tone. Normal symmetric reflexes. Normal coordination and gait Wound:CDI  Disposition: Home     Medication List     As of 04/20/2012  8:56 AM    ASK your doctor about these medications         acetaminophen 325 MG tablet   Commonly known as: TYLENOL   Take 650 mg by mouth every 6 (six) hours as needed. For pain      albuterol 108 (90 BASE) MCG/ACT inhaler   Commonly known as: PROVENTIL HFA;VENTOLIN HFA   Inhale 2 puffs into the lungs every 4 (four) hours as needed. For wheezing      ALPRAZolam 0.25 MG tablet   Commonly known as: XANAX   Take 0.25 mg by mouth 3 (three) times daily as needed. For anxiety      amLODipine 5 MG tablet   Commonly known as: NORVASC   Take 5 mg by mouth 2 (two) times daily.      ARIPiprazole 2 MG tablet   Commonly known as: ABILIFY   Take 2 mg by mouth daily.      clonazePAM 0.5 MG tablet   Commonly known as: KLONOPIN   Take 0.5 mg by mouth 2 (two) times daily as needed. For nerves      clopidogrel 75 MG tablet   Commonly known as: PLAVIX   Take 75 mg by mouth daily.      DEPLIN 15 MG Tabs   Take 15 mg by mouth daily.      HYDROcodone-acetaminophen 5-325 MG per tablet   Commonly known as: NORCO/VICODIN   Take 1 tablet by mouth every 6 (six) hours as needed. For  pain      hydroxypropyl methylcellulose 2.5 % ophthalmic solution   Commonly known as: ISOPTO TEARS   Place 1 drop into both eyes 3 (three) times daily as needed. Dry eyes      metoprolol succinate 25 MG 24 hr tablet   Commonly known as: TOPROL-XL   Take 25 mg by mouth at bedtime.      multivitamin with minerals Tabs   Take 1 tablet by mouth daily.      polyethylene glycol packet   Commonly known as: MIRALAX / GLYCOLAX   Take 17 g by mouth daily.      sertraline 100 MG tablet   Commonly known as: ZOLOFT   Take 100 mg by mouth daily.      topiramate 50 MG tablet   Commonly known as: TOPAMAX   Take 50 mg by mouth 2 (two) times daily.      traMADol 50 MG tablet   Commonly known as: ULTRAM   Take 50 mg by mouth every 6 (six) hours as needed. For pain         Signed: Dorian Heckle, MD 04/20/2012, 8:56 AM

## 2012-04-20 NOTE — Progress Notes (Signed)
Subjective: Patient reports doing great!  Hip also hurts less  Objective: Vital signs in last 24 hours: Temp:  [97.3 F (36.3 C)-99.3 F (37.4 C)] 98.1 F (36.7 C) (10/12 0803) Pulse Rate:  [68-94] 81  (10/12 0424) Resp:  [12-34] 18  (10/12 0803) BP: (95-146)/(58-97) 143/75 mmHg (10/12 0803) SpO2:  [85 %-100 %] 85 % (10/12 0803)  Intake/Output from previous day: 10/11 0701 - 10/12 0700 In: 1840 [P.O.:240; I.V.:1600] Out: 150 [Blood:150] Intake/Output this shift: Total I/O In: 360 [P.O.:360] Out: -   Physical Exam: Full strength bilateral upper extremities D/B/T/HI walking well.  Dressing CDI  Lab Results: No results found for this basename: WBC:2,HGB:2,HCT:2,PLT:2 in the last 72 hours BMET No results found for this basename: NA:2,K:2,CL:2,CO2:2,GLUCOSE:2,BUN:2,CREATININE:2,CALCIUM:2 in the last 72 hours  Studies/Results: Dg Cervical Spine 2-3 Views  04/19/2012  *RADIOLOGY REPORT*  Clinical Data: Cervical spine fusion.  CERVICAL SPINE - 2-3 VIEW  Comparison: No priors.  Findings: Two intraoperative cross-table lateral views of the cervical spine are submitted for evaluation.  Film #1 demonstrates an intubated patient with a surgical probe in place anteriorly at the C5-C6 interspace.  Image #2 demonstrates an anterior soft tissue retractor and interval placement of a plate and screw fixation device which extends from C4-C6 with interbody grafts at the C4-C5 and C5-C6 interspace.  IMPRESSION: 1.  Intraoperative localization and documentation of ACDF from C4- C6, as above.   Original Report Authenticated By: Florencia Reasons, M.D.     Assessment/Plan: Doing well.  D/C home.  F/u 3 weeks with Dr. Franky Macho.    LOS: 1 day    Dorian Heckle, MD 04/20/2012, 8:59 AM

## 2012-04-22 ENCOUNTER — Encounter (HOSPITAL_COMMUNITY): Payer: Self-pay | Admitting: Neurosurgery

## 2012-04-27 ENCOUNTER — Observation Stay (HOSPITAL_COMMUNITY)
Admission: EM | Admit: 2012-04-27 | Discharge: 2012-04-29 | Disposition: A | Discharge status: Home / Self Care | Payer: Medicare Other | Attending: Infectious Diseases | Admitting: Infectious Diseases

## 2012-04-27 ENCOUNTER — Emergency Department (HOSPITAL_COMMUNITY): Payer: Medicare Other

## 2012-04-27 ENCOUNTER — Encounter (HOSPITAL_COMMUNITY): Payer: Self-pay | Admitting: *Deleted

## 2012-04-27 ENCOUNTER — Encounter: Payer: Self-pay | Admitting: Internal Medicine

## 2012-04-27 DIAGNOSIS — R0609 Other forms of dyspnea: Secondary | ICD-10-CM | POA: Insufficient documentation

## 2012-04-27 DIAGNOSIS — I739 Peripheral vascular disease, unspecified: Secondary | ICD-10-CM | POA: Insufficient documentation

## 2012-04-27 DIAGNOSIS — R0989 Other specified symptoms and signs involving the circulatory and respiratory systems: Secondary | ICD-10-CM | POA: Insufficient documentation

## 2012-04-27 DIAGNOSIS — R0600 Dyspnea, unspecified: Secondary | ICD-10-CM

## 2012-04-27 DIAGNOSIS — F411 Generalized anxiety disorder: Secondary | ICD-10-CM | POA: Insufficient documentation

## 2012-04-27 DIAGNOSIS — F419 Anxiety disorder, unspecified: Secondary | ICD-10-CM | POA: Diagnosis present

## 2012-04-27 DIAGNOSIS — E785 Hyperlipidemia, unspecified: Secondary | ICD-10-CM | POA: Insufficient documentation

## 2012-04-27 DIAGNOSIS — Q602 Renal agenesis, unspecified: Secondary | ICD-10-CM | POA: Insufficient documentation

## 2012-04-27 DIAGNOSIS — I10 Essential (primary) hypertension: Secondary | ICD-10-CM | POA: Insufficient documentation

## 2012-04-27 DIAGNOSIS — Z981 Arthrodesis status: Secondary | ICD-10-CM | POA: Insufficient documentation

## 2012-04-27 DIAGNOSIS — G5600 Carpal tunnel syndrome, unspecified upper limb: Secondary | ICD-10-CM | POA: Insufficient documentation

## 2012-04-27 DIAGNOSIS — Z79899 Other long term (current) drug therapy: Secondary | ICD-10-CM | POA: Insufficient documentation

## 2012-04-27 DIAGNOSIS — R911 Solitary pulmonary nodule: Secondary | ICD-10-CM | POA: Insufficient documentation

## 2012-04-27 DIAGNOSIS — E7800 Pure hypercholesterolemia, unspecified: Secondary | ICD-10-CM | POA: Insufficient documentation

## 2012-04-27 DIAGNOSIS — R609 Edema, unspecified: Secondary | ICD-10-CM

## 2012-04-27 DIAGNOSIS — R079 Chest pain, unspecified: Secondary | ICD-10-CM | POA: Diagnosis present

## 2012-04-27 DIAGNOSIS — L408 Other psoriasis: Secondary | ICD-10-CM | POA: Insufficient documentation

## 2012-04-27 DIAGNOSIS — F3289 Other specified depressive episodes: Secondary | ICD-10-CM | POA: Insufficient documentation

## 2012-04-27 DIAGNOSIS — F329 Major depressive disorder, single episode, unspecified: Secondary | ICD-10-CM | POA: Insufficient documentation

## 2012-04-27 DIAGNOSIS — I129 Hypertensive chronic kidney disease with stage 1 through stage 4 chronic kidney disease, or unspecified chronic kidney disease: Secondary | ICD-10-CM | POA: Insufficient documentation

## 2012-04-27 DIAGNOSIS — J189 Pneumonia, unspecified organism: Secondary | ICD-10-CM | POA: Insufficient documentation

## 2012-04-27 DIAGNOSIS — R0789 Other chest pain: Principal | ICD-10-CM | POA: Insufficient documentation

## 2012-04-27 DIAGNOSIS — M129 Arthropathy, unspecified: Secondary | ICD-10-CM | POA: Insufficient documentation

## 2012-04-27 DIAGNOSIS — R0602 Shortness of breath: Secondary | ICD-10-CM | POA: Insufficient documentation

## 2012-04-27 DIAGNOSIS — E782 Mixed hyperlipidemia: Secondary | ICD-10-CM | POA: Diagnosis present

## 2012-04-27 DIAGNOSIS — N183 Chronic kidney disease, stage 3 unspecified: Secondary | ICD-10-CM | POA: Insufficient documentation

## 2012-04-27 DIAGNOSIS — F17200 Nicotine dependence, unspecified, uncomplicated: Secondary | ICD-10-CM | POA: Insufficient documentation

## 2012-04-27 LAB — URINALYSIS, ROUTINE W REFLEX MICROSCOPIC
Bilirubin Urine: NEGATIVE
Glucose, UA: NEGATIVE mg/dL
Hgb urine dipstick: NEGATIVE
Ketones, ur: NEGATIVE mg/dL
Leukocytes, UA: NEGATIVE
Nitrite: NEGATIVE
Protein, ur: NEGATIVE mg/dL
Specific Gravity, Urine: 1.008 (ref 1.005–1.030)
Urobilinogen, UA: 0.2 mg/dL (ref 0.0–1.0)
pH: 7 (ref 5.0–8.0)

## 2012-04-27 LAB — CBC
HCT: 31.2 % — ABNORMAL LOW (ref 36.0–46.0)
Hemoglobin: 10.8 g/dL — ABNORMAL LOW (ref 12.0–15.0)
MCH: 33.6 pg (ref 26.0–34.0)
MCHC: 34.6 g/dL (ref 30.0–36.0)
MCV: 97.2 fL (ref 78.0–100.0)
Platelets: 203 10*3/uL (ref 150–400)
RBC: 3.21 MIL/uL — ABNORMAL LOW (ref 3.87–5.11)
RDW: 12.5 % (ref 11.5–15.5)
WBC: 5.8 10*3/uL (ref 4.0–10.5)

## 2012-04-27 LAB — COMPREHENSIVE METABOLIC PANEL
ALT: 9 U/L (ref 0–35)
AST: 19 U/L (ref 0–37)
Albumin: 3.8 g/dL (ref 3.5–5.2)
Alkaline Phosphatase: 74 U/L (ref 39–117)
BUN: 20 mg/dL (ref 6–23)
CO2: 24 mEq/L (ref 19–32)
Calcium: 9.8 mg/dL (ref 8.4–10.5)
Chloride: 104 mEq/L (ref 96–112)
Creatinine, Ser: 1.02 mg/dL (ref 0.50–1.10)
GFR calc Af Amer: 62 mL/min — ABNORMAL LOW (ref >90)
GFR calc non Af Amer: 54 mL/min — ABNORMAL LOW (ref >90)
Glucose, Bld: 87 mg/dL (ref 70–99)
Potassium: 4.2 mEq/L (ref 3.5–5.1)
Sodium: 138 mEq/L (ref 135–145)
Total Bilirubin: 0.3 mg/dL (ref 0.3–1.2)
Total Protein: 7.1 g/dL (ref 6.0–8.3)

## 2012-04-27 LAB — LIPID PANEL
Cholesterol: 181 mg/dL (ref 0–200)
HDL: 59 mg/dL (ref >39)
LDL Cholesterol: 77 mg/dL (ref 0–99)
Total CHOL/HDL Ratio: 3.1 RATIO
Triglycerides: 223 mg/dL — ABNORMAL HIGH (ref <150)
VLDL: 45 mg/dL — ABNORMAL HIGH (ref 0–40)

## 2012-04-27 LAB — TROPONIN I
Troponin I: <0.3 ng/mL (ref <0.30)
Troponin I: <0.3 ng/mL (ref <0.30)

## 2012-04-27 MED ORDER — TOPIRAMATE 25 MG PO TABS
50.0000 mg | ORAL_TABLET | Freq: Two times a day (BID) | ORAL | Status: DC
  Administered 2012-04-28 – 2012-04-29 (×3): 50 mg via ORAL
  Filled 2012-04-27 (×5): qty 2

## 2012-04-27 MED ORDER — HYPROMELLOSE (GONIOSCOPIC) 2.5 % OP SOLN
1.0000 [drp] | Freq: Three times a day (TID) | OPHTHALMIC | Status: DC | PRN
  Filled 2012-04-27: qty 15

## 2012-04-27 MED ORDER — FENOFIBRATE 160 MG PO TABS
160.0000 mg | ORAL_TABLET | Freq: Every day | ORAL | Status: DC
  Administered 2012-04-28 – 2012-04-29 (×2): 160 mg via ORAL
  Filled 2012-04-27 (×3): qty 1

## 2012-04-27 MED ORDER — DEPLIN 15 MG PO TABS: 15.0000 mg | ORAL_TABLET | Freq: Every day | ORAL | Status: DC

## 2012-04-27 MED ORDER — ALPRAZOLAM 0.25 MG PO TABS
0.2500 mg | ORAL_TABLET | Freq: Three times a day (TID) | ORAL | Status: DC | PRN
  Administered 2012-04-27 – 2012-04-28 (×2): 0.25 mg via ORAL
  Filled 2012-04-27 (×2): qty 1

## 2012-04-27 MED ORDER — IOHEXOL 350 MG/ML SOLN
70.0000 mL | Freq: Once | INTRAVENOUS | Status: AC | PRN
  Administered 2012-04-27: 70 mL via INTRAVENOUS

## 2012-04-27 MED ORDER — METOPROLOL SUCCINATE ER 25 MG PO TB24
25.0000 mg | ORAL_TABLET | Freq: Every day | ORAL | Status: DC
  Administered 2012-04-28: 25 mg via ORAL
  Filled 2012-04-27 (×3): qty 1

## 2012-04-27 MED ORDER — POLYVINYL ALCOHOL 1.4 % OP SOLN
1.0000 [drp] | Freq: Three times a day (TID) | OPHTHALMIC | Status: DC | PRN
  Filled 2012-04-27: qty 15

## 2012-04-27 MED ORDER — ASPIRIN 81 MG PO CHEW
324.0000 mg | CHEWABLE_TABLET | Freq: Once | ORAL | Status: AC
  Administered 2012-04-27: 324 mg via ORAL
  Filled 2012-04-27: qty 4

## 2012-04-27 MED ORDER — NITROGLYCERIN 0.4 MG SL SUBL: 0.4000 mg | SUBLINGUAL_TABLET | SUBLINGUAL | Status: DC | PRN

## 2012-04-27 MED ORDER — ACETAMINOPHEN 325 MG PO TABS
650.0000 mg | ORAL_TABLET | Freq: Four times a day (QID) | ORAL | Status: DC | PRN
  Administered 2012-04-29 (×2): 650 mg via ORAL
  Filled 2012-04-27: qty 2

## 2012-04-27 MED ORDER — HYDROCODONE-ACETAMINOPHEN 5-325 MG PO TABS
1.0000 | ORAL_TABLET | Freq: Four times a day (QID) | ORAL | Status: DC | PRN
  Administered 2012-04-27 – 2012-04-29 (×6): 1 via ORAL
  Filled 2012-04-27 (×6): qty 1

## 2012-04-27 MED ORDER — ARIPIPRAZOLE 2 MG PO TABS
2.0000 mg | ORAL_TABLET | Freq: Every day | ORAL | Status: DC
  Administered 2012-04-28 – 2012-04-29 (×2): 2 mg via ORAL
  Filled 2012-04-27 (×3): qty 1

## 2012-04-27 MED ORDER — PANTOPRAZOLE SODIUM 40 MG PO TBEC
40.0000 mg | DELAYED_RELEASE_TABLET | Freq: Every day | ORAL | Status: DC
Start: 2012-04-27 — End: 2012-04-29
  Administered 2012-04-28 – 2012-04-29 (×2): 40 mg via ORAL
  Filled 2012-04-27 (×2): qty 1

## 2012-04-27 MED ORDER — SERTRALINE HCL 100 MG PO TABS
100.0000 mg | ORAL_TABLET | Freq: Every day | ORAL | Status: DC
  Administered 2012-04-28 – 2012-04-29 (×2): 100 mg via ORAL
  Filled 2012-04-27 (×3): qty 1

## 2012-04-27 MED ORDER — ADULT MULTIVITAMIN W/MINERALS CH
1.0000 | ORAL_TABLET | Freq: Every day | ORAL | Status: DC
  Administered 2012-04-28 – 2012-04-29 (×2): 1 via ORAL
  Filled 2012-04-27 (×3): qty 1

## 2012-04-27 MED ORDER — ENOXAPARIN SODIUM 40 MG/0.4ML ~~LOC~~ SOLN
40.0000 mg | SUBCUTANEOUS | Status: DC
  Administered 2012-04-28: 40 mg via SUBCUTANEOUS
  Filled 2012-04-27 (×3): qty 0.4

## 2012-04-27 MED ORDER — AMLODIPINE BESYLATE 5 MG PO TABS
5.0000 mg | ORAL_TABLET | Freq: Two times a day (BID) | ORAL | Status: DC
  Administered 2012-04-28 – 2012-04-29 (×3): 5 mg via ORAL
  Filled 2012-04-27 (×5): qty 1

## 2012-04-27 MED ORDER — CLOPIDOGREL BISULFATE 75 MG PO TABS
75.0000 mg | ORAL_TABLET | Freq: Every day | ORAL | Status: DC
  Administered 2012-04-28 – 2012-04-29 (×2): 75 mg via ORAL
  Filled 2012-04-27 (×3): qty 1

## 2012-04-27 MED ORDER — SODIUM CHLORIDE 0.9 % IV SOLN
INTRAVENOUS | Status: DC
  Administered 2012-04-27: 20 mL/h via INTRAVENOUS

## 2012-04-27 MED ORDER — TRAMADOL HCL 50 MG PO TABS
50.0000 mg | ORAL_TABLET | Freq: Four times a day (QID) | ORAL | Status: DC | PRN
  Administered 2012-04-27 – 2012-04-28 (×2): 50 mg via ORAL
  Filled 2012-04-27 (×2): qty 1

## 2012-04-27 MED ORDER — ONDANSETRON HCL 4 MG/2ML IJ SOLN: 4.0000 mg | Freq: Four times a day (QID) | INTRAMUSCULAR | Status: DC | PRN

## 2012-04-27 MED ORDER — POLYETHYLENE GLYCOL 3350 17 G PO PACK
17.0000 g | PACK | Freq: Every day | ORAL | Status: DC
  Administered 2012-04-28: 17 g via ORAL
  Filled 2012-04-27 (×3): qty 1

## 2012-04-27 MED ORDER — ASPIRIN 81 MG PO CHEW
81.0000 mg | CHEWABLE_TABLET | Freq: Every day | ORAL | Status: DC
  Administered 2012-04-28 – 2012-04-29 (×2): 81 mg via ORAL
  Filled 2012-04-27 (×2): qty 1

## 2012-04-27 MED ORDER — ALBUTEROL SULFATE HFA 108 (90 BASE) MCG/ACT IN AERS
2.0000 | INHALATION_SPRAY | RESPIRATORY_TRACT | Status: DC | PRN
Start: 2012-04-27 — End: 2012-04-29
  Filled 2012-04-27: qty 6.7

## 2012-04-27 MED ORDER — ONDANSETRON HCL 4 MG PO TABS: 4.0000 mg | ORAL_TABLET | Freq: Four times a day (QID) | ORAL | Status: DC | PRN

## 2012-04-27 NOTE — Progress Notes (Signed)
VASCULAR LAB PRELIMINARY  PRELIMINARY  PRELIMINARY  PRELIMINARY  Bilateral lower extremity venous Dopplers completed.    Preliminary report:  There is no DVT or SVT noted in the bilateral lower extremities.  [name] [name], 04/27/2012, 5:59 PM

## 2012-04-27 NOTE — ED Provider Notes (Signed)
History     CSN: [id number]  Arrival date & time 04/27/12  1240   First MD Initiated Contact with Patient 04/27/12 1241      Chief Complaint  Patient presents with  . Shortness of Breath    (Consider location/radiation/quality/duration/timing/severity/associated sxs/prior treatment) Patient is a 71 y.o. female presenting with shortness of breath. The history is provided by the patient and the EMS personnel.  Shortness of Breath  Associated symptoms include chest pain and shortness of breath. Pertinent negatives include no fever.  pt with 2 episodes cp/tightness today. First early morning at rest, lasted 5-10 minutes, felt heaviness in chest w sob. No diaphoresis. No nv. Episode recurred this afternoon at rest. Had cervical disc surgery 8 days ago, states went well, no complications. No neck swelling or trouble swallowing. Has had occasional non productive cough since surgery, no change today. No fever or chills. Denies hx cad, +hx peripheral vascular disease. No leg pain or swelling. No constant or pleuritic cp, but rather episodic chest heaviness and sob. No orthopnea or pnd. Denies change in meds, states is compliant w meds.   Past Medical History  Diagnosis Date  . Hypertension   . Heart murmur   . Pneumonia   . Forgetfulness   . Anxiety   . Mental disorder   . Depression   . Psoriasis   . Arthritis   . Hypercholesteremia   . Congenital absence of one kidney     Pt has right kidney only  . Neuromuscular disorder   . Carpal tunnel syndrome, bilateral   . Chronic kidney disease     CKD stage III, absence of left kidney  . Peripheral vascular disease     Dr. Carollee Massed; right external iliac stent '06 with angioplasty '13    Past Surgical History  Procedure Date  . Abdominal hysterectomy     Partial   . Appendectomy   . Tonsillectomy   . Eye surgery   . Iliac artery stent   . Anterior cervical decomp/discectomy fusion 04/19/2012    Procedure: ANTERIOR CERVICAL  DECOMPRESSION/DISCECTOMY FUSION 2 LEVELS;  Surgeon: Carmela Hurt, MD;  Location: [facility];  Service: Neurosurgery;  Laterality: N/A;  Cervical four-five,Cervical five-six anterior cervical decompression with fusion plating and bonegraft possible posterior cervical decompression    No family history on file.  History  Substance Use Topics  . Smoking status: Former Games developer  . Smokeless tobacco: Not on file  . Alcohol Use: No    OB History    Grav Para Term Preterm Abortions TAB SAB Ect Mult Living                  Review of Systems  Constitutional: Negative for fever and chills.  HENT: Negative for neck pain.   Eyes: Negative for redness.  Respiratory: Positive for shortness of breath.   Cardiovascular: Positive for chest pain. Negative for palpitations and leg swelling.  Gastrointestinal: Negative for abdominal pain.  Genitourinary: Negative for dysuria and flank pain.  Musculoskeletal: Negative for back pain.  Skin: Negative for rash.  Neurological: Negative for headaches.  Hematological: Does not bruise/bleed easily.  Psychiatric/Behavioral: Negative for confusion.    Allergies  Gabapentin and Other  Home Medications   Current Outpatient Rx  Name Route Sig Dispense Refill  . ACETAMINOPHEN 325 MG PO TABS Oral Take 650 mg by mouth every 6 (six) hours as needed. For pain    . ALBUTEROL SULFATE HFA 108 (90 BASE) MCG/ACT IN AERS Inhalation Inhale  2 puffs into the lungs every 4 (four) hours as needed. For wheezing    . ALPRAZOLAM 0.25 MG PO TABS Oral Take 0.25 mg by mouth 3 (three) times daily as needed. For anxiety    . AMLODIPINE BESYLATE 5 MG PO TABS Oral Take 5 mg by mouth 2 (two) times daily.    . ARIPIPRAZOLE 2 MG PO TABS Oral Take 2 mg by mouth daily.    [name] CLONAZEPAM 0.5 MG PO TABS Oral Take 0.5 mg by mouth 2 (two) times daily as needed. For nerves    . CLOPIDOGREL BISULFATE 75 MG PO TABS Oral Take 75 mg by mouth daily.    [name] HYDROCODONE-ACETAMINOPHEN 5-325 MG PO  TABS Oral Take 1 tablet by mouth every 6 (six) hours as needed. For pain    . HYDROCODONE-ACETAMINOPHEN 5-325 MG PO TABS Oral Take 1-2 tablets by mouth every 4 (four) hours as needed for pain. 60 tablet 0  . HYPROMELLOSE 2.5 % OP SOLN Both Eyes Place 1 drop into both eyes 3 (three) times daily as needed. Dry eyes    . DEPLIN 15 MG PO TABS Oral Take 15 mg by mouth daily.    [name] METOPROLOL SUCCINATE ER 25 MG PO TB24 Oral Take 25 mg by mouth at bedtime.    . ADULT MULTIVITAMIN W/MINERALS CH Oral Take 1 tablet by mouth daily.    [name] POLYETHYLENE GLYCOL 3350 PO PACK Oral Take 17 g by mouth daily.    . SERTRALINE HCL 100 MG PO TABS Oral Take 100 mg by mouth daily.    . TOPIRAMATE 50 MG PO TABS Oral Take 50 mg by mouth 2 (two) times daily.    . TRAMADOL HCL 50 MG PO TABS Oral Take 50 mg by mouth every 6 (six) hours as needed. For pain      BP 149/73  Pulse 72  Temp 98 F (36.7 C) (Oral)  Resp 18  SpO2 100%  Physical Exam  Nursing note and vitals reviewed. Constitutional: She appears well-developed and well-nourished. No distress.  HENT:  Nose: Nose normal.  Mouth/Throat: Oropharynx is clear and moist.  Eyes: Conjunctivae normal are normal. No scleral icterus.  Neck: Neck supple. No JVD present. No tracheal deviation present.       Healing surgical wound lower neck, left anterior, no sign of infection, no apparent hematoma or sts.   Cardiovascular: Normal rate, regular rhythm, normal heart sounds and intact distal pulses.  Exam reveals no gallop and no friction rub.   No murmur heard. Pulmonary/Chest: Effort normal and breath sounds normal. No respiratory distress. She exhibits no tenderness.  Abdominal: Soft. Normal appearance and bowel sounds are normal. She exhibits no distension. There is no tenderness.  Musculoskeletal: She exhibits no edema and no tenderness.  Neurological: She is alert.  Skin: Skin is warm and dry. No rash noted.  Psychiatric: She has a normal mood and affect.    ED  Course  Procedures (including critical care time)   Labs Reviewed  CBC  COMPREHENSIVE METABOLIC PANEL  TROPONIN I   Results for orders placed during the hospital encounter of 04/27/12  CBC      Component Value Range   WBC 5.8  4.0 - 10.5 K/uL   RBC 3.21 (*) 3.87 - 5.11 MIL/uL   Hemoglobin 10.8 (*) 12.0 - 15.0 g/dL   HCT 08.6 (*) 57.8 - 46.9 %   MCV 97.2  78.0 - 100.0 fL   MCH 33.6  26.0 - 34.0 pg  MCHC 34.6  30.0 - 36.0 g/dL   RDW 16.1  09.6 - 04.5 %   Platelets 203  150 - 400 K/uL  COMPREHENSIVE METABOLIC PANEL      Component Value Range   Sodium 138  135 - 145 mEq/L   Potassium 4.2  3.5 - 5.1 mEq/L   Chloride 104  96 - 112 mEq/L   CO2 24  19 - 32 mEq/L   Glucose, Bld 87  70 - 99 mg/dL   BUN 20  6 - 23 mg/dL   Creatinine, Ser 4.09  0.50 - 1.10 mg/dL   Calcium 9.8  8.4 - 81.1 mg/dL   Total Protein 7.1  6.0 - 8.3 g/dL   Albumin 3.8  3.5 - 5.2 g/dL   AST 19  0 - 37 U/L   ALT 9  0 - 35 U/L   Alkaline Phosphatase 74  39 - 117 U/L   Total Bilirubin 0.3  0.3 - 1.2 mg/dL   GFR calc non Af Amer 54 (*) >90 mL/min   GFR calc Af Amer 62 (*) >90 mL/min  TROPONIN I      Component Value Range   Troponin I <0.30  <0.30 ng/mL   Dg Chest 2 View  04/27/2012  *RADIOLOGY REPORT*  Clinical Data: Shortness of breath  CHEST - 2 VIEW  Comparison: 04/11/2012  Findings: The heart size is normal.  No pleural effusion or edema. No airspace consolidation identified.  Coarsened interstitial markings are noted bilaterally.  There is a scoliosis deformity with spondylosis involving the thoracic spine.  IMPRESSION:  1.  Chronic interstitial coarsening. 2.  No pneumonia.   Original Report Authenticated By: Rosealee Albee, M.D.    Dg Chest 2 View  04/11/2012  *RADIOLOGY REPORT*  Clinical Data: 71 year old female preoperative study for spine surgery.  Hypertension and heart murmur.  CHEST - 2 VIEW  Comparison: 02/09/2004.  Findings: Mild scoliosis re-identified.  Lung volumes at the upper limits of  normal to mildly hyperinflated.  Cardiac size and mediastinal contours are within normal limits.  Mild increased interstitial markings are stable.  No pneumothorax, pulmonary edema, pleural effusion or confluent pulmonary opacity.  IMPRESSION: No acute cardiopulmonary abnormality.   Original Report Authenticated By: Harley Hallmark, M.D.    Dg Cervical Spine 2-3 Views  04/19/2012  *RADIOLOGY REPORT*  Clinical Data: Cervical spine fusion.  CERVICAL SPINE - 2-3 VIEW  Comparison: No priors.  Findings: Two intraoperative cross-table lateral views of the cervical spine are submitted for evaluation.  Film #1 demonstrates an intubated patient with a surgical probe in place anteriorly at the C5-C6 interspace.  Image #2 demonstrates an anterior soft tissue retractor and interval placement of a plate and screw fixation device which extends from C4-C6 with interbody grafts at the C4-C5 and C5-C6 interspace.  IMPRESSION: 1.  Intraoperative localization and documentation of ACDF from C4- C6, as above.   Original Report Authenticated By: Florencia Reasons, M.D.       MDM  Iv ns. Labs.  Cxr.  Reviewed nursing notes and prior charts for additional history.     Date: 04/27/2012  Rate: 67  Rhythm: normal sinus rhythm  QRS Axis: normal  Intervals: normal  ST/T Wave abnormalities: normal  Conduction Disutrbances:none  Narrative Interpretation:   Old EKG Reviewed: unchanged   Given episodes cp/discomfort, sob, will admit for chest pain r/o workup.   Unassigned med service called to see/admit, tsb will admit.   Recheck pt, no cp.  Suzi Roots, MD 04/27/12 [phone]

## 2012-04-27 NOTE — H&P (Signed)
Hospital Admission Note Date: 04/27/2012  Patient name: [name] [name] Medical record number: [id number] Date of birth: 10/31/1940 Age: 71 y.o. Gender: female PCP: Malka So., MD  Medical Service: Internal Medicine Teaching Service  Attending physician:  Dr. Ninetta Lights    1st Contact: Dr. Sherrine Maples   Pager: [phone] 2nd Contact: Dr. Anselm Jungling    Pager: [phone] After 5 pm or weekends: 1st Contact:      Pager: [phone] 2nd Contact:      Pager: [phone]  Chief Complaint: Chest pain/SOB  History of Present Illness: [name] is a 71 yo woman w/ PMH cervical spondylosis, s/p cervical decompression/discketcotmy with fusion 04/19/12, depression/anxiety, HTN, bilateral femoral stents in 2006 by Dr. Sondra Come in [facility] who presents with sudden onset of chest pain and SOB that started at 4AM on day of admission that woke her up from sleep. She describes pain as squeezing starting from epigastric region and then radiates down her right arm lasting for about 5-10 minutes, intermittent.  She states that her chest pain felt like heart burn.  SOB is exacerbated by movement, she feels like she can't get air in.  Her daughter reports that when she was having her chest pain, her face was cold.  She denies any nausea or vomiting or PND. She sleeps on 4 pillows at night after neck surgery but normally she sleeps on about 2-3 pillows at night.   + Premature Family History of CAD in father at age of 21 and 59. She smokes about 3 cigarettes since the age of 33.  She denies any recent long car/airplane ride, immobility, personal history of cancer, or family history of blood clots.    Meds: Current Outpatient Rx  Name Route Sig Dispense Refill  . ACETAMINOPHEN 325 MG PO TABS Oral Take 650 mg by mouth every 6 (six) hours as needed. For pain    . ALBUTEROL SULFATE HFA 108 (90 BASE) MCG/ACT IN AERS Inhalation Inhale 2 puffs into the lungs every 4 (four) hours as needed. For wheezing    . ALPRAZOLAM 0.25 MG PO TABS Oral Take  0.25 mg by mouth 3 (three) times daily as needed. For anxiety    . AMLODIPINE BESYLATE 5 MG PO TABS Oral Take 5 mg by mouth 2 (two) times daily.    . ARIPIPRAZOLE 2 MG PO TABS Oral Take 2 mg by mouth daily.    [name] CLONAZEPAM 0.5 MG PO TABS Oral Take 0.5 mg by mouth 2 (two) times daily as needed. For nerves    . CLOPIDOGREL BISULFATE 75 MG PO TABS Oral Take 75 mg by mouth daily.    . FENOFIBRATE 160 MG PO TABS Oral Take 1 tablet by mouth daily.    [name] HYDROCODONE-ACETAMINOPHEN 5-325 MG PO TABS Oral Take 1 tablet by mouth every 6 (six) hours as needed. For pain    . HYPROMELLOSE 2.5 % OP SOLN Both Eyes Place 1 drop into both eyes 3 (three) times daily as needed. Dry eyes    . DEPLIN 15 MG PO TABS Oral Take 15 mg by mouth daily.    [name] METOPROLOL SUCCINATE ER 25 MG PO TB24 Oral Take 25 mg by mouth at bedtime.    . ADULT MULTIVITAMIN W/MINERALS CH Oral Take 1 tablet by mouth daily.    [name] PANTOPRAZOLE SODIUM 40 MG PO TBEC Oral Take 40 mg by mouth daily.    [name] POLYETHYLENE GLYCOL 3350 PO PACK Oral Take 17 g by mouth daily.    . SERTRALINE  HCL 100 MG PO TABS Oral Take 100 mg by mouth daily.    . TOPIRAMATE 50 MG PO TABS Oral Take 50 mg by mouth 2 (two) times daily.    . TRAMADOL HCL 50 MG PO TABS Oral Take 50 mg by mouth every 6 (six) hours as needed. For pain      Allergies: Allergies as of 04/27/2012 - Review Complete 04/27/2012  Allergen Reaction Noted  . Gabapentin Itching 04/11/2012  . Other Other (See Comments) 04/11/2012   Past Medical History  Diagnosis Date  . Hypertension   . Heart murmur   . Pneumonia   . Forgetfulness   . Anxiety   . Mental disorder   . Depression   . Psoriasis   . Arthritis   . Hypercholesteremia   . Congenital absence of one kidney     Pt has right kidney only  . Neuromuscular disorder   . Carpal tunnel syndrome, bilateral   . Chronic kidney disease     CKD stage III, absence of left kidney  . Peripheral vascular disease     Dr. Carollee Massed; right external iliac  stent '06 with angioplasty '13   Past Surgical History  Procedure Date  . Abdominal hysterectomy     Partial   . Appendectomy   . Tonsillectomy   . Eye surgery   . Iliac artery stent   . Anterior cervical decomp/discectomy fusion 04/19/2012    Procedure: ANTERIOR CERVICAL DECOMPRESSION/DISCECTOMY FUSION 2 LEVELS;  Surgeon: Carmela Hurt, MD;  Location: [facility];  Service: Neurosurgery;  Laterality: N/A;  Cervical four-five,Cervical five-six anterior cervical decompression with fusion plating and bonegraft possible posterior cervical decompression   No family history on file. History   Social History  . Marital Status: Widowed    Spouse Name: N/A    Number of Children: N/A  . Years of Education: N/A   Occupational History  . Not on file.   Social History Main Topics  . Smoking status: Former Games developer  . Smokeless tobacco: Not on file  . Alcohol Use: No  . Drug Use: No  . Sexually Active:    Other Topics Concern  . Not on file   Social History Narrative  . No narrative on file    Review of Systems: Constitutional: Denies fever, chills, diaphoresis, appetite change and fatigue.  HEENT: Denies photophobia, eye pain, redness, hearing loss, ear pain, congestion, sore throat, rhinorrhea, sneezing, mouth sores, trouble swallowing, +neck pain, +neck stiffness from neck surgery Respiratory: Endorses sudden SOB that woke her from her sleep. Denies DOE, cough, chest tightness, and wheezing.  Cardiovascular: + squeezing cramping right sided chest/breast pain, and BLE edema.  Gastrointestinal: + mid epigastric squeezing, cramping pain. Denies nausea, vomiting, diarrhea, constipation, blood in stool and abdominal distention.  Genitourinary: Denies dysuria, urgency, frequency, hematuria, flank pain and difficulty urinating.  Musculoskeletal: Denies myalgias, back pain, joint swelling, +arthralgias and gait problem.  Skin: + ecchymoses. Denies pallor, no rashes Neurological: Denies  dizziness, seizures, syncope, weakness, light-headedness, numbness and headaches.  Hematological: Denies adenopathy. +Easy bruising, personal or family bleeding history  Psychiatric/Behavioral: Denies suicidal ideation, mood changes, confusion, +nervousness, sleep disturbance and agitation  Physical Exam: Blood pressure 149/73, pulse 72, temperature 98 F (36.7 C), temperature source Oral, resp. rate 18, SpO2 100.00%. General: Well-developed, and cooperative on examination, NAD  Head: Normocephalic and atraumatic.  Eyes:PERRL, EOMI, and anicteric.  Mouth: Pharynx pink and moist, no erythema, and no exudates, dentures in place.  Neck: Anterior incisional wound  c/d/i. No thyromegaly, no JVD. R carotid bruit.   Lungs: CTAB, normal respiratory effort, normal breath sounds, no crackles, and no wheezes. Heart: RRR, no murmur, rub, or gallop.  Abdomen: Soft, mild TTP at bladder, normal bowel sounds, no distention, no guarding, no rebound tenderness Msk: No joint swelling, no joint warmth, and no redness over joints. TTP to deep palpation in bilateral popliteal fossas. Pulses: 2+ radial and DP pulses bilaterally Extremities: No cyanosis, clubbing, +2 nonpitting edema in BLE Neurologic: Alert & oriented X3, cranial nerves II-XII intact, strength normal in all extremities, sensation intact to light touch, and gait normal.  Skin: turgor normal and multiple ecchymosis on upper and lower extremities with some scaling of the skin. Psych: Memory intact for recent and remote, normally interactive, good eye contact. Somewhat anxious.  Lab results: Basic Metabolic Panel:  Basename 04/27/12 1257  NA 138  K 4.2  CL 104  CO2 24  GLUCOSE 87  BUN 20  CREATININE 1.02  CALCIUM 9.8  MG --  PHOS --   Liver Function Tests:  Basename 04/27/12 1257  AST 19  ALT 9  ALKPHOS 74  BILITOT 0.3  PROT 7.1  ALBUMIN 3.8   CBC:  Basename 04/27/12 1257  WBC 5.8  NEUTROABS --  HGB 10.8*  HCT 31.2*  MCV  97.2  PLT 203   Cardiac Enzymes:  Basename 04/27/12 1310  CKTOTAL --  CKMB --  CKMBINDEX --  TROPONINI <0.30    Imaging results:  Dg Chest 2 View  04/27/2012  *RADIOLOGY REPORT*  Clinical Data: Shortness of breath  CHEST - 2 VIEW  Comparison: 04/11/2012  Findings: The heart size is normal.  No pleural effusion or edema. No airspace consolidation identified.  Coarsened interstitial markings are noted bilaterally.  There is a scoliosis deformity with spondylosis involving the thoracic spine.  IMPRESSION:  1.  Chronic interstitial coarsening. 2.  No pneumonia.   Original Report Authenticated By: Rosealee Albee, M.D.     Other results: EKG: no ST or T waves changes compared to EKG on 10/13  Assessment & Plan by Problem: 71 yo F w/ PMH cervical spondylosis, s/p cervical decompression/discketcotmy with fusion 04/19/12, depression/anxiety,  who presents with sudden onset of chest pain and SOB that woke her up at 4am and occurred again around 12pm and resolved after 5-10 min each time.  1. Chest pain/SOB: Pt describes the pain as a squeezing/cramping sensation, starting in the mid epigastrium and radiating into her right breast and into her right forearm. She was initially awoken with SOB and dyspnea and then the pain began. This occurred again around 12pm, and each time the episodes lasted for 5-69min, and were not exacerbated or relieved by anything. She has never experienced symptoms like this in the past. She says the epigastric pain felt like reflux, and endorsed a sore muscle-type pain in her right breast and forearm after the pain subsided. She denies having a history of reflux but she is on Protonix at home. Her sx could be contributed to anxiety. She does endorse anxiety and depression for which she takes Zoloft, Abilify, and Xanax and Klonopin PRN.  Given the sudden onset of SOB and chest pain and recent neck surgery, PE is on our differential.  Her Wells score was 7.5 which gives her a  high prob with 40% of having a PE.  Her revised Geneva score is 7 (age>65, surgery within 1 month, and pain on deep palpation of lower limb) which is intermediate risk.  She also has pain in her popliteal region bilaterally which could be DVT.  In addition, she also has risk factors for ACS including HTN, HLP, and remote stents of her iliac vasculature (2006, by Dr. Sondra Come in [facility]); therefore, we need to rule out ACS.  Troponins x1 have been negative and EKG did not show any acute ST or T wave changes.   - Admit to telemetry - Cycle cardiac enzymes q8hrs  - Repeat EKG in AM - Aspirin 81mg  qd - Ffenofibrate because she is allergic to statins (joint swelling and muscle ache) - Sublingual NTG for PRN chest pain - Stat CT Angio to rule out PE - LE Doppler to rule out DVT - Protonix for GERD  2. HLP: Pt endorses HLD, but we do not have baseline lipid profile.  She is allergic to statins as mention above. She takes Fenofibrate at home. - Will check lipid panel - Continue Fenofibrate - Continue Plavix for bilateral iliac stents  3. HTN: Well controlled. Will continue home medications of Norvasc 5mg  bid, Metoprolol XL 25 mg qd  4. Anxiety/Depression: She does appear anxious during assessment.  She is on Xanax 0.25mg  TID PRN, Klonopin 0.5mg  BID PRN, Abilify 2mg  daily, and Zoloft 100mg  daily. -Will continue Abilify, Zoloft -Will continue Xanax 0.25mg  PRN  5. DVT ppx: Lovenox 40 mg sq    Signed: Genelle Gather 04/27/2012, 4:56 PM   Carrolyn Meiers, PGY-3 04/27/12 @ 5:40PM

## 2012-04-27 NOTE — ED Notes (Addendum)
Sob, and rt. Sided chest wall this morning, which has resolved; last Friday anterior, cervical procedure.

## 2012-04-28 ENCOUNTER — Encounter (HOSPITAL_COMMUNITY): Payer: Self-pay | Admitting: *Deleted

## 2012-04-28 DIAGNOSIS — F329 Major depressive disorder, single episode, unspecified: Secondary | ICD-10-CM

## 2012-04-28 DIAGNOSIS — F3289 Other specified depressive episodes: Secondary | ICD-10-CM

## 2012-04-28 DIAGNOSIS — R079 Chest pain, unspecified: Secondary | ICD-10-CM

## 2012-04-28 DIAGNOSIS — R0602 Shortness of breath: Secondary | ICD-10-CM

## 2012-04-28 DIAGNOSIS — I10 Essential (primary) hypertension: Secondary | ICD-10-CM

## 2012-04-28 DIAGNOSIS — F411 Generalized anxiety disorder: Secondary | ICD-10-CM

## 2012-04-28 LAB — TROPONIN I: Troponin I: <0.3 ng/mL (ref <0.30)

## 2012-04-28 MED ORDER — DEPLIN 15 15-90.314 MG PO CAPS
15.0000 mg | ORAL_CAPSULE | Freq: Every day | ORAL | Status: DC
  Administered 2012-04-28 – 2012-04-29 (×2): 15 mg via ORAL
  Filled 2012-04-28: qty 1

## 2012-04-28 MED ORDER — REGADENOSON 0.4 MG/5ML IV SOLN
0.4000 mg | Freq: Once | INTRAVENOUS | Status: AC
  Administered 2012-04-29: 0.4 mg via INTRAVENOUS
  Filled 2012-04-28: qty 5

## 2012-04-28 NOTE — H&P (Signed)
Internal Medicine Attending Admission Note Date: 04/28/2012  Patient name: [name] [name] Medical record number: [id number] Date of birth: [birth year] Age: 71 y.o. Gender: female  I saw and evaluated the patient. I reviewed the resident's note and I agree with the resident's findings and plan as documented in the resident's note.  Chief Complaint(s): Chest pain and shortness of breath  History - key components related to admission: Patient is a 71 year old female with past medical history most significant for hypertension, bilateral femoral stents for peripheral vascular disease, hypercholesterolemia, chronic kidney disease stage III and chronic smoker who comes in with complaints of chest pain which started on the day of admission. Pain was described as 10 out of 10, squeezing in character, present in the epigastric region, radiating towards the right arm, intermittent, lasted for 5-10 minutes at a time, no exacerbating factors, no relieving factors, associated with shortness of breath.   Patient was fairly active and used to walk every day up until her surgery for cervical decompression when she was told to rest before getting back to her usual routine. Patient denies any chest pain or shortness of breath while these evening walks. Patient did have a stress test about 8 years ago which was negative for reversible ischemia. Her family history is notable for premature coronary artery disease in her father.  12 point review of system was negative otherwise.     Physical Exam - key components related to admission:  Filed Vitals:   04/27/12 1914 04/27/12 2158 04/27/12 2230 04/28/12 0427  BP: 127/86  152/85 119/72  Pulse:   58 66  Temp:   97.9 F (36.6 C) 98.1 F (36.7 C)  TempSrc:   Oral Oral  Resp: 12  18 20   Height:   5' (1.524 m)   Weight:   122 lb 12.8 oz (55.702 kg)   SpO2: 100% 100% 98% 96%   Patient was alert oriented and very pleasant throughout our conversation and examination No  JVD noted S1-S2 normal, 2/6 diastolic murmur best heard at left upper sternal border, difficult to characterize, no rubs or gallops Clear to auscultation bilaterally without wheezing, rhonchi or crackles Abdomen was soft nontender and nondistended Patient had 2+ distal pulses in bilateral lower extremities  Lab results:   Basic Metabolic Panel:  Basename 04/27/12 1257  NA 138  K 4.2  CL 104  CO2 24  GLUCOSE 87  BUN 20  CREATININE 1.02  CALCIUM 9.8  MG --  PHOS --   Liver Function Tests:  Basename 04/27/12 1257  AST 19  ALT 9  ALKPHOS 74  BILITOT 0.3  PROT 7.1  ALBUMIN 3.8   CBC:  Basename 04/27/12 1257  WBC 5.8  NEUTROABS --  HGB 10.8*  HCT 31.2*  MCV 97.2  PLT 203   Cardiac Enzymes:  Basename 04/28/12 0550 04/27/12 2155 04/27/12 1310  CKTOTAL -- -- --  CKMB -- -- --  CKMBINDEX -- -- --  TROPONINI <0.30 <0.30 <0.30   Fasting Lipid Panel:  Basename 04/27/12 2155  CHOL 181  HDL 59  LDLCALC 77  TRIG 223*  CHOLHDL 3.1  LDLDIRECT --    Imaging results:  Dg Chest 2 View  04/27/2012  IMPRESSION:  1.  Chronic interstitial coarsening. 2.  No pneumonia.   Original Report Authenticated By: Rosealee Albee, M.D.    Ct Angio Chest Pe W/cm &/or Wo Cm  04/27/2012 IMPRESSION: No evidence of pulmonary embolism.  Very mild patchy/ground-glass opacities in the right lower lobe,  possibly infectious.  Scattered bilateral pulmonary nodules measuring up to 5 x 3 mm. Given high risk for primary bronchogenic carcinoma, a single follow- up CT is suggested in 12 months per Fleischner Society guidelines.  Mild paraseptal emphysematous changes.  This recommendation follows the consensus statement: Guidelines for Management of Small Pulmonary Nodules Detected on CT Scans: A Statement from the Fleischner Society as published in Radiology 2005; 237:395-400.   Original Report Authenticated By: Charline Bills, M.D.     Other results: EKG: Regular rate and rhythm, normal PR  and QRS intervals, no ST and T-wave changes noted as compared to EKG on October 13th.  Assessment & Plan by Problem:  Principal Problem:  *Chest pain Active Problems:  Hypertension  Anxiety and depression  Hyperlipemia  Patient's chest pain is atypical in character and started when she was resting. CT angiogram did not reveal pulmonary embolism at this time which was a concern given that patient was recently hospitalized/immobilized. Patient's cardiac enzymes and repeat EKG have been unrevealing. CT scan suggestive of very mild patchy groundglass opacity in the right lower lobe possibly infectious as per the cardiologist does not fit with the clinical presentation(absence of cough, fever, chills), lab findings(absence of white count). Given that patient is a vasculopath with previous history of peripheral vascular disease, current smoker, hypertension, family history of premature coronary artery disease, I believe that patient should have stress test inpatient to rule out the high likelihood of this pain being secondary to coronary artery disease. We will continue aspirin Plavix and fenofibrate along with home doses of beta blockers at this time.   Patient also was noted to have pulmonary nodules on CT scan which are currently small in size(less than 5 mm) and warrants further evaluation by a repeat CT scan in 12 months.   Patient was counseled extensively on the benefits of smoking cessation at this time.  Rest of the medical management as per resident's note.  I discussed the assessment and plan with the patient who is in agreement. She also requested me to set her up with [facility] if possible as she wants to switch her primary care physician.  Lars Mage MD Pager [phone]

## 2012-04-28 NOTE — Progress Notes (Signed)
Subjective: Pt denies any further episode of chest pain. She is experiencing intermittent pain in her right arm and forearm that is relieved when she elevates her shoulder- she says it is in her muscles, which are tender.  Objective: Vital signs in last 24 hours: Filed Vitals:   04/27/12 1914 04/27/12 2158 04/27/12 2230 04/28/12 0427  BP: 127/86  152/85 119/72  Pulse:   58 66  Temp:   97.9 F (36.6 C) 98.1 F (36.7 C)  TempSrc:   Oral Oral  Resp: 12  18 20   Height:   5' (1.524 m)   Weight:   122 lb 12.8 oz (55.702 kg)   SpO2: 100% 100% 98% 96%   Weight change:  No intake or output data in the 24 hours ending 04/28/12 0918 Vitals reviewed. General:Sittingup in bed, NAD HEENT: PERRL, EOMI, no scleral icterus Cardiac: RRR Pulm: Clear to auscultation bilaterally, no wheezes, rales, or rhonchi. TTP of right chest wall into axilla, no palpable nodes appreciated. Abd: Soft, nontender, nondistended Ext: RUE TTP in forearm. 2+ non pitting edema in BLE. 2+ pulses. Warm and well perfused. Neuro: Alert and oriented X3, cranial nerves II-XII grossly intact, strength and sensation to light touch equal in bilateral upper and lower extremities.  Lab Results: Basic Metabolic Panel:  Lab 04/27/12 1191  NA 138  K 4.2  CL 104  CO2 24  GLUCOSE 87  BUN 20  CREATININE 1.02  CALCIUM 9.8  MG --  PHOS --   Liver Function Tests:  Lab 04/27/12 1257  AST 19  ALT 9  ALKPHOS 74  BILITOT 0.3  PROT 7.1  ALBUMIN 3.8   CBC:  Lab 04/27/12 1257  WBC 5.8  NEUTROABS --  HGB 10.8*  HCT 31.2*  MCV 97.2  PLT 203   Cardiac Enzymes:  Lab 04/28/12 0550 04/27/12 2155 04/27/12 1310  CKTOTAL -- -- --  CKMB -- -- --  CKMBINDEX -- -- --  TROPONINI <0.30 <0.30 <0.30   Fasting Lipid Panel:  Lab 04/27/12 2155  CHOL 181  HDL 59  LDLCALC 77  TRIG 223*  CHOLHDL 3.1  LDLDIRECT --   Urinalysis:  Lab 04/27/12 1930  COLORURINE YELLOW  LABSPEC 1.008  PHURINE 7.0  GLUCOSEU NEGATIVE  HGBUR  NEGATIVE  BILIRUBINUR NEGATIVE  KETONESUR NEGATIVE  PROTEINUR NEGATIVE  UROBILINOGEN 0.2  NITRITE NEGATIVE  LEUKOCYTESUR NEGATIVE    Micro Results: No results found for this or any previous visit (from the past 240 hour(s)). Studies/Results: Dg Chest 2 View  04/27/2012  *RADIOLOGY REPORT*  Clinical Data: Shortness of breath  CHEST - 2 VIEW  Comparison: 04/11/2012  Findings: The heart size is normal.  No pleural effusion or edema. No airspace consolidation identified.  Coarsened interstitial markings are noted bilaterally.  There is a scoliosis deformity with spondylosis involving the thoracic spine.  IMPRESSION:  1.  Chronic interstitial coarsening. 2.  No pneumonia.   Original Report Authenticated By: Rosealee Albee, M.D.    Ct Angio Chest Pe W/cm &/or Wo Cm  04/27/2012  *RADIOLOGY REPORT*  Clinical Data: Sudden onset shortness of breath/chest pain, recent neck surgery  CT ANGIOGRAPHY CHEST  Technique:  Multidetector CT imaging of the chest using the standard protocol during bolus administration of intravenous contrast. Multiplanar reconstructed images including MIPs were obtained and reviewed to evaluate the vascular anatomy.  Contrast: 70mL OMNIPAQUE IOHEXOL 350 MG/ML SOLN  Comparison: Chest radiographs dated 04/27/2012  Findings: No evidence of pulmonary embolism.  Multiple small bilateral pulmonary nodules  throughout all lobes, many of which are subpleural, measuring up to 5 x 3 mm in the posterior right upper lobe (series 5/image 26).  Very mild patchy/ground-glass opacities in the right lower lobe (series 5/image 58), possibly infectious.  Mild subpleural reticulation/dependent atelectasis in the bilateral lower lobes.  Mild paraseptal emphysematous changes. No pleural effusion or pneumothorax.  The visualized thyroid is unremarkable.  The heart is normal in size.  No pericardial effusion.  Coronary atherosclerosis.  Atherosclerotic calcifications of the aortic arch.  Visualized upper  abdomen is notable for nodular soft tissue in the left upper abdomen (series 4/image 75), possibly reflecting known congenital renal abnormality.  Degenerative changes of the visualized thoracolumbar spine.  S- shaped thoracic scoliosis.  IMPRESSION: No evidence of pulmonary embolism.  Very mild patchy/ground-glass opacities in the right lower lobe, possibly infectious.  Scattered bilateral pulmonary nodules measuring up to 5 x 3 mm. Given high risk for primary bronchogenic carcinoma, a single follow- up CT is suggested in 12 months per Fleischner Society guidelines.  Mild paraseptal emphysematous changes.  This recommendation follows the consensus statement: Guidelines for Management of Small Pulmonary Nodules Detected on CT Scans: A Statement from the Fleischner Society as published in Radiology 2005; 237:395-400.   Original Report Authenticated By: Charline Bills, M.D.    Medications: I have reviewed the patient's current medications. Scheduled Meds:   . amLODipine  5 mg Oral BID  . ARIPiprazole  2 mg Oral Daily  . aspirin  324 mg Oral Once  . aspirin  81 mg Oral Daily  . clopidogrel  75 mg Oral Daily  . DEPLIN  15 mg Oral Daily  . enoxaparin (LOVENOX) injection  40 mg Subcutaneous Q24H  . fenofibrate  160 mg Oral Daily  . metoprolol succinate  25 mg Oral QHS  . multivitamin with minerals  1 tablet Oral Daily  . pantoprazole  40 mg Oral Daily  . polyethylene glycol  17 g Oral Daily  . sertraline  100 mg Oral Daily  . topiramate  50 mg Oral BID   Continuous Infusions:   . sodium chloride 20 mL/hr (04/27/12 1517)   PRN Meds:.acetaminophen, albuterol, ALPRAZolam, HYDROcodone-acetaminophen, iohexol, nitroGLYCERIN, ondansetron (ZOFRAN) IV, ondansetron, polyvinyl alcohol, traMADol, DISCONTD: hydroxypropyl methylcellulose Assessment/Plan: 71 yo F w/ PMH cervical spondylosis, s/p cervical decompression/discketcotmy with fusion 04/19/12, depression/anxiety, who presents with sudden onset of  chest pain and SOB that woke her up at 4am and occurred again around 12pm and resolved after 5-10 min each time.   1. Chest pain/SOB: Atypical chest pain has not returned. EKG without changes, sinus rhythm and troponins all <0.30. CT Angio and BLE duplex negative for PE and DVT. However, she is having episodes of pain in her right arm and forearm, which she describes as musculoskeletal pain. Given her recent sx, and significant risk for CAD, with significant PVD and family h/o of premature CAD in her father, consulted Cardiology to evaluate the pt and perform a stress test to help r/o ACS. The pt states that her last stress test was about 8 ys ago.  - Cardiology consult - Myoview Monday - NPO @ [facility] for myoview - Continue Aspirin 81mg  qd  - Continue fenofibrate (she is allergic to statins- joint swelling and muscle ache)  - Sublingual NTG for PRN chest pain  - Continue Protonix for GERD   2. Pulmonary nodules: 5x3 mm pulmonary nodules seen in the posterior RUL on CT angio. Given smoking hx, at high risk for bronchogenic carcinoma.  -  Recommend reimaging in 12 months.  3. RLL opacity: Seen on CT Angio. Mild opacities seen. Pt afebrile w/o leukocytosis. Will hold off on abx for now.  4. HLP: Pt endorses HLD. She is allergic to statins as mention above and takes Fenofibrate at home.  Lipid panel with Tot cholesterol of 181, LDL 77, and HDL 59. TG elevated at 223. - Continue Fenofibrate  - Continue Plavix for bilateral iliac stents   5. HTN: Well controlled. Continuing home medications of Norvasc 5mg  bid, Metoprolol XL 25 mg qd   6. Anxiety/Depression: She is on Xanax 0.25mg  TID PRN, Klonopin 0.5mg  BID PRN, Abilify 2mg  daily, and Zoloft 100mg  daily at home. - Continue Abilify, Zoloft  - Continue Xanax 0.25mg  PRN   7. DVT ppx: Lovenox 40 mg sq      LOS: 1 day   Genelle Gather 04/28/2012, 9:18 AM

## 2012-04-29 ENCOUNTER — Inpatient Hospital Stay (HOSPITAL_COMMUNITY): Payer: Medicare Other

## 2012-04-29 DIAGNOSIS — R079 Chest pain, unspecified: Secondary | ICD-10-CM

## 2012-04-29 MED ORDER — ASPIRIN 81 MG PO CHEW: 81.0000 mg | CHEWABLE_TABLET | Freq: Every day | ORAL | Status: DC

## 2012-04-29 MED ORDER — TECHNETIUM TC 99M SESTAMIBI GENERIC - CARDIOLITE
30.0000 | Freq: Once | INTRAVENOUS | Status: AC | PRN
  Administered 2012-04-29: 30 via INTRAVENOUS

## 2012-04-29 MED ORDER — AZITHROMYCIN 500 MG PO TABS
500.0000 mg | ORAL_TABLET | Freq: Every day | ORAL | Status: AC
  Administered 2012-04-29: 500 mg via ORAL
  Filled 2012-04-29: qty 1

## 2012-04-29 MED ORDER — AZITHROMYCIN 250 MG PO TABS: 250.0000 mg | ORAL_TABLET | Freq: Every day | ORAL | Status: DC

## 2012-04-29 MED ORDER — TECHNETIUM TC 99M SESTAMIBI GENERIC - CARDIOLITE
10.0000 | Freq: Once | INTRAVENOUS | Status: AC | PRN
  Administered 2012-04-29: 10 via INTRAVENOUS

## 2012-04-29 MED ORDER — ACETAMINOPHEN 325 MG PO TABS
ORAL_TABLET | ORAL | Status: AC
  Filled 2012-04-29: qty 2

## 2012-04-29 MED ORDER — AZITHROMYCIN 250 MG PO TABS: ORAL_TABLET | ORAL | Status: DC

## 2012-04-29 NOTE — Progress Notes (Signed)
Internal Medicine Teaching Service Attending Note Date: 04/29/2012  Patient name: [name] [name]  Medical record number: [id number]  Date of birth: 27-Mar-1941   I have seen and evaluated [name] [name] and discussed their care with the Residency Team.   Denies further CP or SOB    . acetaminophen      . amLODipine  5 mg Oral BID  . ARIPiprazole  2 mg Oral Daily  . aspirin  81 mg Oral Daily  . azithromycin  500 mg Oral Daily   Followed by  . azithromycin  250 mg Oral Daily  . clopidogrel  75 mg Oral Daily  . DEPLIN 15  15 mg Oral Daily  . enoxaparin (LOVENOX) injection  40 mg Subcutaneous Q24H  . fenofibrate  160 mg Oral Daily  . metoprolol succinate  25 mg Oral QHS  . multivitamin with minerals  1 tablet Oral Daily  . pantoprazole  40 mg Oral Daily  . polyethylene glycol  17 g Oral Daily  . regadenoson  0.4 mg Intravenous Once  . sertraline  100 mg Oral Daily  . topiramate  50 mg Oral BID    Physical Exam: Blood pressure 117/63, pulse 80, temperature 98.3 F (36.8 C), temperature source Oral, resp. rate 18, height 5' (1.524 m), weight 53.524 kg (118 lb), SpO2 99.00%. General appearance: alert, cooperative and no distress Eyes: EOMI Throat: normal findings: oropharynx pink & moist without lesions or evidence of thrush Neck: no adenopathy and L neck wound is clean. no fluctuance or erythema. non-tender.  Resp: clear to auscultation bilaterally Cardio: regular rate and rhythm GI: normal findings: bowel sounds normal and soft, non-tender  Lab results: No results found for this or any previous visit (from the past 24 hour(s)).  Imaging results:  Ct Angio Chest Pe W/cm &/or Wo Cm  04/27/2012  *RADIOLOGY REPORT*  Clinical Data: Sudden onset shortness of breath/chest pain, recent neck surgery  CT ANGIOGRAPHY CHEST  Technique:  Multidetector CT imaging of the chest using the standard protocol during bolus administration of intravenous contrast. Multiplanar reconstructed images  including MIPs were obtained and reviewed to evaluate the vascular anatomy.  Contrast: 70mL OMNIPAQUE IOHEXOL 350 MG/ML SOLN  Comparison: Chest radiographs dated 04/27/2012  Findings: No evidence of pulmonary embolism.  Multiple small bilateral pulmonary nodules throughout all lobes, many of which are subpleural, measuring up to 5 x 3 mm in the posterior right upper lobe (series 5/image 26).  Very mild patchy/ground-glass opacities in the right lower lobe (series 5/image 58), possibly infectious.  Mild subpleural reticulation/dependent atelectasis in the bilateral lower lobes.  Mild paraseptal emphysematous changes. No pleural effusion or pneumothorax.  The visualized thyroid is unremarkable.  The heart is normal in size.  No pericardial effusion.  Coronary atherosclerosis.  Atherosclerotic calcifications of the aortic arch.  Visualized upper abdomen is notable for nodular soft tissue in the left upper abdomen (series 4/image 75), possibly reflecting known congenital renal abnormality.  Degenerative changes of the visualized thoracolumbar spine.  S- shaped thoracic scoliosis.  IMPRESSION: No evidence of pulmonary embolism.  Very mild patchy/ground-glass opacities in the right lower lobe, possibly infectious.  Scattered bilateral pulmonary nodules measuring up to 5 x 3 mm. Given high risk for primary bronchogenic carcinoma, a single follow- up CT is suggested in 12 months per Fleischner Society guidelines.  Mild paraseptal emphysematous changes.  This recommendation follows the consensus statement: Guidelines for Management of Small Pulmonary Nodules Detected on CT Scans: A Statement from the Fleischner Society  as published in Radiology 2005; [id number].   Original Report Authenticated By: Charline Bills, M.D.     Assessment and Plan: I agree with the formulated Assessment and Plan with the following changes:  Chest Pain  Enzymes (-).  Await read of stress test.  Lung nodules  Will f/u as outpt,  radiology reccomends to repeat scan in 12months  Hyperlipidemia  Continue fibrate  Recent cervical disc surgery (04-19-12) B femoral stents 2006  She would like to f/u in IM clinic

## 2012-04-29 NOTE — Progress Notes (Addendum)
Subjective: Denies any chest pain or SOB this AM, doing well. Objective: Vital signs in last 24 hours: Filed Vitals:   04/28/12 1312 04/28/12 2039 04/29/12 0501 04/29/12 0605  BP: 155/90 125/83 124/71   Pulse: 88 81 72   Temp: 97.7 F (36.5 C) 98.3 F (36.8 C) 98.4 F (36.9 C)   TempSrc: Oral Oral Oral   Resp: 20 18 18    Height:      Weight:    118 lb (53.524 kg)  SpO2: 99% 99% 99%    Weight change: -4 lb 12.8 oz (-2.177 kg)  Intake/Output Summary (Last 24 hours) at 04/29/12 0744 Last data filed at 04/29/12 0500  Gross per 24 hour  Intake    720 ml  Output   2000 ml  Net  -1280 ml  Physical Exam:  General: Well-developed, and cooperative on examination, NAD .   Neck: Anterior incisional wound c/d/i. No thyromegaly, no JVD. R carotid bruit.  Lungs: CTAB, normal respiratory effort, normal breath sounds, no crackles, and no wheezes. Heart: RRR, no murmur, rub, or gallop.  Abdomen: Soft, nontender, normal bowel sounds, no distention, no guarding, no rebound tenderness  Msk: No joint swelling, no joint warmth, and no redness over joints. Pulses: 2+ radial and DP pulses bilaterally Extremities: No cyanosis, clubbing, +1 nonpitting edema in BLE Neurologic:nonfocal Skin: turgor normal and multiple ecchymosis on upper and lower extremities with some scaling of the skin. Psych:+ anxious.  Cardiac Enzymes:  Lab 04/28/12 0550 04/27/12 2155 04/27/12 1310  CKTOTAL -- -- --  CKMB -- -- --  CKMBINDEX -- -- --  TROPONINI <0.30 <0.30 <0.30    Medications: reviewed Scheduled Meds:   . amLODipine  5 mg Oral BID  . ARIPiprazole  2 mg Oral Daily  . aspirin  81 mg Oral Daily  . clopidogrel  75 mg Oral Daily  . DEPLIN 15  15 mg Oral Daily  . enoxaparin (LOVENOX) injection  40 mg Subcutaneous Q24H  . fenofibrate  160 mg Oral Daily  . metoprolol succinate  25 mg Oral QHS  . multivitamin with minerals  1 tablet Oral Daily  . pantoprazole  40 mg Oral Daily  . polyethylene glycol  17 g  Oral Daily  . regadenoson  0.4 mg Intravenous Once  . sertraline  100 mg Oral Daily  . topiramate  50 mg Oral BID  . DISCONTD: DEPLIN  15 mg Oral Daily   Continuous Infusions:   . sodium chloride 20 mL/hr (04/27/12 1517)   PRN Meds:.acetaminophen, albuterol, ALPRAZolam, HYDROcodone-acetaminophen, nitroGLYCERIN, ondansetron (ZOFRAN) IV, ondansetron, polyvinyl alcohol, traMADol Assessment/Plan:  1. Chest pain/SOB: Atypical chest pain has not returned. EKG without changes, sinus rhythm and troponins all <0.30. CT Angio and BLE duplex negative for PE and DVT. However, she is having episodes of pain in her right arm and forearm, which she describes as musculoskeletal pain. Given her recent sx, and significant risk for CAD, with significant PVD and family h/o of premature CAD in her father, consulted Cardiology to evaluate the pt and perform a stress test to help r/o ACS. The pt states that her last stress test was about 8 ys ago.  - Myoview pending  - Continue Aspirin 81mg  qd  - Continue fenofibrate (she is allergic to statins- joint swelling and muscle ache)  - Sublingual NTG for PRN chest pain  - Continue Protonix for GERD   2. Pulmonary nodules: 5x3 mm pulmonary nodules seen in the posterior RUL on CT angio. Given smoking hx,  at high risk for bronchogenic carcinoma.  - Recommend reimaging in 12 months.   3. RLL opacity: Seen on CT Angio. Mild opacities seen. Pt afebrile w/o leukocytosis. Will send home with 7 day course of azithromycin.  4. HLP: Pt endorses HLD. She is allergic to statins as mention above and takes Fenofibrate at home.  Lipid panel with Tot cholesterol of 181, LDL 77, and HDL 59. TG elevated at 223.  - Continue Fenofibrate  - Continue Plavix for bilateral iliac stents   5. HTN: Well controlled. Continuing home medications of Norvasc 5mg  bid, Metoprolol XL 25 mg qd   6. Anxiety/Depression: She is on Xanax 0.25mg  TID PRN, Klonopin 0.5mg  BID PRN, Abilify 2mg  daily, and  Zoloft 100mg  daily at home.  - Continue Abilify, Zoloft  - Continue Xanax 0.25mg  PRN   7. DVT ppx: Lovenox 40 mg sq   Dispo: today if negative stress test   LOS: 2 days   [name] [name] 04/29/2012, 7:44 AM  Addendum: Myoview results:"Cannot exclude a small focus of reversibility involving the distal inferolateral wall. Otherwise normal examination without focal wall motion abnormality. The calculated left ventricular ejection fraction is 72%." I discussed the results with Dr. Johney Frame and he thinks it is a low risk study and recommends no further testing.  He will set up follow with [facility] cardiology as outpatient and we greatly appreciate Dr. Jenel Lucks guidance.

## 2012-04-29 NOTE — Discharge Summary (Signed)
Internal Medicine Teaching [facility] Discharge Note  Name: [name] [name] MRN: [id number] DOB: 1940/11/17 71 y.o.  Date of Admission: 04/27/2012 12:40 PM Date of Discharge: 04/29/2012 Attending Physician: Ginnie Smart, MD  Discharge Diagnosis: Atypical Chest pain Pulmonary Nodules RLL PNA Hypertension Anxiety and depression Hyperlipemia    Discharge Medications:   Medication List     As of 04/29/2012  5:20 PM    TAKE these medications         acetaminophen 325 MG tablet   Commonly known as: TYLENOL   Take 650 mg by mouth every 6 (six) hours as needed. For pain      albuterol 108 (90 BASE) MCG/ACT inhaler   Commonly known as: PROVENTIL HFA;VENTOLIN HFA   Inhale 2 puffs into the lungs every 4 (four) hours as needed. For wheezing      ALPRAZolam 0.25 MG tablet   Commonly known as: XANAX   Take 0.25 mg by mouth 3 (three) times daily as needed. For anxiety      amLODipine 5 MG tablet   Commonly known as: NORVASC   Take 5 mg by mouth 2 (two) times daily.      ARIPiprazole 2 MG tablet   Commonly known as: ABILIFY   Take 2 mg by mouth daily.      aspirin 81 MG chewable tablet   Chew 1 tablet (81 mg total) by mouth daily.      azithromycin 250 MG tablet   Commonly known as: ZITHROMAX   Take one tablet daily x 5 days total      clonazePAM 0.5 MG tablet   Commonly known as: KLONOPIN   Take 0.5 mg by mouth 2 (two) times daily as needed. For nerves      clopidogrel 75 MG tablet   Commonly known as: PLAVIX   Take 75 mg by mouth daily.      DEPLIN 15 MG Tabs   Take 15 mg by mouth daily.      fenofibrate 160 MG tablet   Take 1 tablet by mouth daily.      HYDROcodone-acetaminophen 5-325 MG per tablet   Commonly known as: NORCO/VICODIN   Take 1 tablet by mouth every 6 (six) hours as needed. For pain      hydroxypropyl methylcellulose 2.5 % ophthalmic solution   Commonly known as: ISOPTO TEARS   Place 1 drop into both eyes 3 (three) times daily as  needed. Dry eyes      metoprolol succinate 25 MG 24 hr tablet   Commonly known as: TOPROL-XL   Take 25 mg by mouth at bedtime.      multivitamin with minerals Tabs   Take 1 tablet by mouth daily.      pantoprazole 40 MG tablet   Commonly known as: PROTONIX   Take 40 mg by mouth daily.      polyethylene glycol packet   Commonly known as: MIRALAX / GLYCOLAX   Take 17 g by mouth daily.      sertraline 100 MG tablet   Commonly known as: ZOLOFT   Take 100 mg by mouth daily.      topiramate 50 MG tablet   Commonly known as: TOPAMAX   Take 50 mg by mouth 2 (two) times daily.      traMADol 50 MG tablet   Commonly known as: ULTRAM   Take 50 mg by mouth every 6 (six) hours as needed. For pain         Disposition  and follow-up:   [name] was discharged from [facility] in improved and stable condition.  At the hospital follow up visit please address  1. Chest pain 2. Follow up CT chest at 12 months for scattered bilateral pulmonary nodules given high risk of bronchogenic carcinoma in smoker 3. Follow up on RLL PNA- being treated with Azithromycin x 5 days   Follow-up Appointments:    Discharge Orders    Future Appointments: Provider: Department: Dept Phone: Center:   04/30/2012 11:00 AM Wendall Stade, MD [facility] [phone] [id number]     Future Orders Please Complete By Expires   Diet - low sodium heart healthy      Increase activity slowly         Consultations:  None  Procedures Performed:  Dg Chest 2 View  04/27/2012  *RADIOLOGY REPORT*  Clinical Data: Shortness of breath  CHEST - 2 VIEW  Comparison: 04/11/2012  Findings: The heart size is normal.  No pleural effusion or edema. No airspace consolidation identified.  Coarsened interstitial markings are noted bilaterally.  There is a scoliosis deformity with spondylosis involving the thoracic spine.  IMPRESSION:  1.  Chronic interstitial coarsening. 2.  No pneumonia.    Original Report Authenticated By: Rosealee Albee, M.D.    Dg Chest 2 View  04/11/2012  *RADIOLOGY REPORT*  Clinical Data: 71 year old female preoperative study for spine surgery.  Hypertension and heart murmur.  CHEST - 2 VIEW  Comparison: 02/09/2004.  Findings: Mild scoliosis re-identified.  Lung volumes at the upper limits of normal to mildly hyperinflated.  Cardiac size and mediastinal contours are within normal limits.  Mild increased interstitial markings are stable.  No pneumothorax, pulmonary edema, pleural effusion or confluent pulmonary opacity.  IMPRESSION: No acute cardiopulmonary abnormality.   Original Report Authenticated By: Harley Hallmark, M.D.    Dg Cervical Spine 2-3 Views  04/19/2012  *RADIOLOGY REPORT*  Clinical Data: Cervical spine fusion.  CERVICAL SPINE - 2-3 VIEW  Comparison: No priors.  Findings: Two intraoperative cross-table lateral views of the cervical spine are submitted for evaluation.  Film #1 demonstrates an intubated patient with a surgical probe in place anteriorly at the C5-C6 interspace.  Image #2 demonstrates an anterior soft tissue retractor and interval placement of a plate and screw fixation device which extends from C4-C6 with interbody grafts at the C4-C5 and C5-C6 interspace.  IMPRESSION: 1.  Intraoperative localization and documentation of ACDF from C4- C6, as above.   Original Report Authenticated By: Florencia Reasons, M.D.    Ct Angio Chest Pe W/cm &/or Wo Cm  04/27/2012  *RADIOLOGY REPORT*  Clinical Data: Sudden onset shortness of breath/chest pain, recent neck surgery  CT ANGIOGRAPHY CHEST  Technique:  Multidetector CT imaging of the chest using the standard protocol during bolus administration of intravenous contrast. Multiplanar reconstructed images including MIPs were obtained and reviewed to evaluate the vascular anatomy.  Contrast: 70mL OMNIPAQUE IOHEXOL 350 MG/ML SOLN  Comparison: Chest radiographs dated 04/27/2012  Findings: No evidence of  pulmonary embolism.  Multiple small bilateral pulmonary nodules throughout all lobes, many of which are subpleural, measuring up to 5 x 3 mm in the posterior right upper lobe (series 5/image 26).  Very mild patchy/ground-glass opacities in the right lower lobe (series 5/image 58), possibly infectious.  Mild subpleural reticulation/dependent atelectasis in the bilateral lower lobes.  Mild paraseptal emphysematous changes. No pleural effusion or pneumothorax.  The visualized thyroid is unremarkable.  The heart is normal  in size.  No pericardial effusion.  Coronary atherosclerosis.  Atherosclerotic calcifications of the aortic arch.  Visualized upper abdomen is notable for nodular soft tissue in the left upper abdomen (series 4/image 75), possibly reflecting known congenital renal abnormality.  Degenerative changes of the visualized thoracolumbar spine.  S- shaped thoracic scoliosis.  IMPRESSION: No evidence of pulmonary embolism.  Very mild patchy/ground-glass opacities in the right lower lobe, possibly infectious.  Scattered bilateral pulmonary nodules measuring up to 5 x 3 mm. Given high risk for primary bronchogenic carcinoma, a single follow- up CT is suggested in 12 months per Fleischner Society guidelines.  Mild paraseptal emphysematous changes.  This recommendation follows the consensus statement: Guidelines for Management of Small Pulmonary Nodules Detected on CT Scans: A Statement from the Fleischner Society as published in Radiology 2005; 237:395-400.   Original Report Authenticated By: Charline Bills, M.D.    Nm Myocar Multi W/spect W/wall Motion / Ef  04/29/2012  *RADIOLOGY REPORT*  Clinical Data:  History of chest pain and hypertension.  Former smoker.  MYOCARDIAL IMAGING WITH SPECT (REST AND PHARMACOLOGIC-STRESS) GATED LEFT VENTRICULAR WALL MOTION STUDY LEFT VENTRICULAR EJECTION FRACTION  Technique:  Resting myocardial SPECT imaging was initially performed after intravenous administration of  radiopharmaceutical. Myocardial SPECT was subsequently performed after additional radiopharmaceutical injection during pharmacologic-stress (Lexiscan)supervised by the Cardiology staff.  Quantitative gated imaging was also performed to evaluate left ventricular wall motion, and estimate left ventricular ejection fraction.  Radiopharmaceutical:  10 mCi Tc-39m Myoview at rest and 30 mCi during stress.  Comparison: Chest CTA 04/27/2012.  Findings:  SPECT images demonstrate a possible small focus of reversibility involving the distal inferolateral wall.  This has a summed difference score of only 2.  The left ventricular activity is otherwise normal without other suspicious fixed or reversible perfusion defects.  Gated cine images were reviewed on the workstation and demonstrate normal left ventricular wall motion and systolic thickening. The QGS ejection fraction measured at rest is 72% with an end diastolic volume of 41 ml and an end-systolic volume of 11 ml.  IMPRESSION: Cannot exclude a small focus of reversibility involving the distal inferolateral wall. Otherwise normal examination without focal wall motion abnormality.  The calculated left ventricular ejection fraction is 72%.   Original Report Authenticated By: Gerrianne Scale, M.D.     Admission HPI: [name] is a 71 yo woman w/ PMH cervical spondylosis, s/p cervical decompression/discketcotmy with fusion 04/19/12, depression/anxiety, HTN, bilateral femoral stents in 2006 by Dr. Sondra Come in [facility] who presents with sudden onset of chest pain and SOB that started at 4AM on day of admission that woke her up from sleep. She describes pain as squeezing starting from epigastric region and then radiates down her right arm lasting for about 5-10 minutes, intermittent. She states that her chest pain felt like heart burn. SOB is exacerbated by movement, she feels like she can't get air in. Her daughter reports that when she was having her chest pain, her face  was cold. She denies any nausea or vomiting or PND. She sleeps on 4 pillows at night after neck surgery but normally she sleeps on about 2-3 pillows at night.  + Premature Family History of CAD in father at age of 70 and 67. She smokes about 3 cigarettes since the age of 12. She denies any recent long car/airplane ride, immobility, personal history of cancer, or family history of blood clots.  Hospital Course by problem list:  1. Chest pain/SOB:  pain was described  as a squeezing/cramping sensation, starting in the mid epigastrium and radiating into her right breast and into her right forearm which is atypical presentation which completely resolved once she arrive to the hospital.  Although she denies having a history of reflux but she is on Protonix at home and her presentation is more consistent with GERD. Her sx could be contributed to anxiety because she has severe anxiety and depression for which she takes Zoloft, Abilify, and Xanax and Klonopin PRN.  Her EKG not show any acute ST or T wave changes and troponins x 3 were negative and ACS was ruled out.  Given her recent sx, and significant risk for CAD, with significant PVD (remote bilateral femoral stents and right carotid bruit) and family h/o of premature CAD in her father and that her last stress echo was 8 yrs ago, a myoview was performed and results showed "Cannot exclude a small focus of reversibility involving the distal inferolateral wall. Otherwise normal examination without focal wall motion abnormality. The calculated left ventricular ejection fraction is 72%."    I discussed the results with Dr. Johney Frame and he thinks it is a low risk study and recommends no further testing.  She was previously followed by Dr. Sondra Come in [facility] but would like to see another cardiologist in [facility]; therefore, we set up appt with Dr. Eden Emms with [facility] on 04/30/12.  We started her on ASA 81mg  qd and continued Fenofibrate as she is allergic to  statins (Lipitor, crestor, simvastatin, pravastatin).   She is on BB at home.  Given the sudden onset of SOB and chest pain and recent neck surgery, PE is also on our differential. Her Wells score was 7.5 which gives her a high prob with 40% of having a PE. Her revised Geneva score is 7 (age>65, surgery within 1 month, and pain on deep palpation of lower limb) which is intermediate risk. She also has pain in her popliteal region bilaterally which could be DVT. CT angiography did not show any acute pulmonary embolism and Doppler was negative for DVT/baker cysts.    2. Pulmonary nodules: scattered bilateral pulmonary nodules measuring up to 5x3 mm seen in the posterior RUL on CT angio. Given smoking hx, at high risk for bronchogenic carcinoma.  Recommend reimaging in 12 months per Fleischner Society guidelines.Very mild patchy/ground-glass opacities in the right lower lobe, possibly infectious. Will send this discharge summary to her PCP so he can follow up appropriately.  3. RLL opacity: CT Angio showed very mild patchy/ground-glass opacities in the right lower lobe,  possibly infectious. Will treat with 5 days course of Azithromycin as outpatient.  Will need a repeat CXR or CT (also for her pulm nodules) to ensure complete resolution.  4. HLP: LDL is 77 and triglyceride is 223. She is allergic to statins (joint swelling and muscle ache). We continued her home dose of Fenofibrate. Plavix was continued for her femoral stents.  5. HTN: mostly well-controlled except for a few elevated measurements in the 160's. We continued home medications of Norvasc 5mg  bid, Metoprolol XL 25 mg qd   6. Anxiety/Depression: She does appear anxious during assessment. At home, she is on Xanax 0.25mg  TID PRN, Klonopin 0.5mg  BID PRN, Abilify 2mg  daily, and Zoloft 100mg  daily.  We continued her home regimen except for holding Klonopin since she is already taking Xanax PRN.  7. DVT ppx: Lovenox 40 mg sq   Discharge Vitals:   BP 117/63  Pulse 80  Temp 98.3 F (36.8  C) (Oral)  Resp 18  Ht 5' (1.524 m)  Wt 118 lb (53.524 kg)  BMI 23.05 kg/m2  SpO2 99%  Discharge Labs: No results found for this or any previous visit (from the past 24 hour(s)).  Signed: Jaspreet Hollings 04/29/2012, 5:20 PM   Time Spent on Discharge: >30 mins

## 2012-04-29 NOTE — Progress Notes (Signed)
Pt discharge instructions and patient education complete. IV site d/c. Site WNL. No s/s of distress. D/C home with daughter. [name] [name]

## 2012-04-30 ENCOUNTER — Encounter: Payer: Self-pay | Admitting: Cardiovascular Disease

## 2012-04-30 ENCOUNTER — Ambulatory Visit (INDEPENDENT_AMBULATORY_CARE_PROVIDER_SITE_OTHER): Payer: Medicare Other | Admitting: Cardiovascular Disease

## 2012-04-30 VITALS — BP 100/78 | HR 124 | Ht 60.0 in | Wt 119.0 lb

## 2012-04-30 DIAGNOSIS — R918 Other nonspecific abnormal finding of lung field: Secondary | ICD-10-CM

## 2012-04-30 DIAGNOSIS — J189 Pneumonia, unspecified organism: Secondary | ICD-10-CM

## 2012-04-30 DIAGNOSIS — I739 Peripheral vascular disease, unspecified: Secondary | ICD-10-CM

## 2012-04-30 DIAGNOSIS — E785 Hyperlipidemia, unspecified: Secondary | ICD-10-CM

## 2012-04-30 DIAGNOSIS — I10 Essential (primary) hypertension: Secondary | ICD-10-CM

## 2012-04-30 DIAGNOSIS — R079 Chest pain, unspecified: Secondary | ICD-10-CM

## 2012-04-30 DIAGNOSIS — J984 Other disorders of lung: Secondary | ICD-10-CM

## 2012-04-30 DIAGNOSIS — R0600 Dyspnea, unspecified: Secondary | ICD-10-CM | POA: Insufficient documentation

## 2012-04-30 DIAGNOSIS — R0989 Other specified symptoms and signs involving the circulatory and respiratory systems: Secondary | ICD-10-CM

## 2012-04-30 DIAGNOSIS — R0609 Other forms of dyspnea: Secondary | ICD-10-CM

## 2012-04-30 NOTE — Progress Notes (Signed)
Patient ID: [name] [name], female   DOB: Aug 18, 1940, 71 y.o.   MRN: [id number] 71 yo just D/C from hospital for dyspnea.  Extensive w/u.  Reviewed records.  Had reticulonodular pattern on Xray.  CT negative for PE.  Currently on zythromax for ? Infectious process.  Has vascular disease.  [place]  Previous LE stent and no current claudication.  Has known carotid bruits and gets US neck every 6 months with no tight lesions.  Had stress myovue in hospital which was normal. Had atypical chest pain post cervical laminectomy and is still weak.  No current pain.  Dyspnea improved  Radiology indicates need for f/u CT for lung nodules.  Primary is [place] in [place]  ROS: Denies fever, malais, weight loss, blurry vision, decreased visual acuity, cough, sputum, SOB, hemoptysis, pleuritic pain, palpitaitons, heartburn, abdominal pain, melena, lower extremity edema, claudication, or rash.  All other systems reviewed and negative   General: Affect appropriate Healthy:  appears stated age HEENT: normal Neck supple with no adenopathy JVP normal right  bruits no thyromegaly Lungs clear with no wheezing and good diaphragmatic motion Heart:  S1/S2 no murmur,rub, gallop or click PMI normal Abdomen: benighn, BS positve, no tenderness, no AAA no bruit.  No HSM or HJR Distal pulses intact with  Bilateral femoral bruits No edema Neuro non-focal Skin warm and dry No muscular weakness  Medications Current Outpatient Prescriptions  Medication Sig Dispense Refill  . acetaminophen (TYLENOL) 325 MG tablet Take 650 mg by mouth every 6 (six) hours as needed. For pain      . albuterol (PROVENTIL HFA;VENTOLIN HFA) 108 (90 BASE) MCG/ACT inhaler Inhale 2 puffs into the lungs every 4 (four) hours as needed. For wheezing      . ALPRAZolam (XANAX) 0.25 MG tablet Take 0.25 mg by mouth 3 (three) times daily as needed. For anxiety      . amLODipine (NORVASC) 5 MG tablet Take 5 mg by mouth 2 (two) times daily.       . ARIPiprazole (ABILIFY) 2 MG tablet Take 2 mg by mouth daily.      [name] aspirin 81 MG chewable tablet Chew 1 tablet (81 mg total) by mouth daily.  30 tablet  0  . clonazePAM (KLONOPIN) 0.5 MG tablet Take 0.5 mg by mouth 2 (two) times daily as needed. For nerves      . clopidogrel (PLAVIX) 75 MG tablet Take 75 mg by mouth daily.      . fenofibrate 160 MG tablet Take 1 tablet by mouth daily.      [name] HYDROcodone-acetaminophen (NORCO/VICODIN) 5-325 MG per tablet Take 1 tablet by mouth every 6 (six) hours as needed. For pain      . hydroxypropyl methylcellulose (ISOPTO TEARS) 2.5 % ophthalmic solution Place 1 drop into both eyes 3 (three) times daily as needed. Dry eyes      . L-Methylfolate (DEPLIN) 15 MG TABS Take 15 mg by mouth daily.      . metoprolol succinate (TOPROL-XL) 25 MG 24 hr tablet Take 25 mg by mouth at bedtime.      . Multiple Vitamin (MULTIVITAMIN WITH MINERALS) TABS Take 1 tablet by mouth daily.      . pantoprazole (PROTONIX) 40 MG tablet Take 40 mg by mouth daily.      . polyethylene glycol (MIRALAX / GLYCOLAX) packet Take 17 g by mouth daily.      . promethazine (PHENERGAN) 12.5 MG tablet Take 12.5 mg by mouth  every 6 (six) hours as needed.      . sertraline (ZOLOFT) 100 MG tablet Take 100 mg by mouth daily.      [name] topiramate (TOPAMAX) 50 MG tablet Take 50 mg by mouth 2 (two) times daily.      . traMADol (ULTRAM) 50 MG tablet Take 50 mg by mouth every 6 (six) hours as needed. For pain       No current facility-administered medications for this visit.   Facility-Administered Medications Ordered in Other Visits  Medication Dose Route Frequency Provider Last Rate Last Dose  . azithromycin (ZITHROMAX) tablet 500 mg  500 mg Oral Daily Mathis Dad, MD   500 mg at 04/29/12 1351  . DISCONTD: 0.9 %  sodium chloride infusion   Intravenous Continuous Suzi Roots, MD 20 mL/hr at 04/27/12 1517 20 mL/hr at 04/27/12 1517  . DISCONTD: acetaminophen (TYLENOL) 325 MG tablet           .  DISCONTD: acetaminophen (TYLENOL) tablet 650 mg  650 mg Oral Q6H PRN Mathis Dad, MD   650 mg at 04/29/12 1715  . DISCONTD: albuterol (PROVENTIL HFA;VENTOLIN HFA) 108 (90 BASE) MCG/ACT inhaler 2 puff  2 puff Inhalation Q4H PRN Mathis Dad, MD      . DISCONTD: ALPRAZolam Prudy Feeler) tablet 0.25 mg  0.25 mg Oral TID PRN Mathis Dad, MD   0.25 mg at 04/28/12 2213  . DISCONTD: amLODipine (NORVASC) tablet 5 mg  5 mg Oral BID Mathis Dad, MD   5 mg at 04/29/12 1215  . DISCONTD: ARIPiprazole (ABILIFY) tablet 2 mg  2 mg Oral Daily Mathis Dad, MD   2 mg at 04/29/12 1214  . DISCONTD: aspirin chewable tablet 81 mg  81 mg Oral Daily Mathis Dad, MD   81 mg at 04/29/12 1223  . DISCONTD: azithromycin (ZITHROMAX) tablet 250 mg  250 mg Oral Daily Mathis Dad, MD      . DISCONTD: clopidogrel (PLAVIX) tablet 75 mg  75 mg Oral Daily Mathis Dad, MD   75 mg at 04/29/12 1214  . DISCONTD: DEPLIN 15 15-90.314 MG CAPS 15 mg  15 mg Oral Daily Ginnie Smart, MD   15 mg at 04/29/12 1215  . DISCONTD: enoxaparin (LOVENOX) injection 40 mg  40 mg Subcutaneous Q24H Mathis Dad, MD   40 mg at 04/28/12 2214  . DISCONTD: fenofibrate tablet 160 mg  160 mg Oral Daily Mathis Dad, MD   160 mg at 04/29/12 1214  . DISCONTD: HYDROcodone-acetaminophen (NORCO/VICODIN) 5-325 MG per tablet 1 tablet  1 tablet Oral Q6H PRN Mathis Dad, MD   1 tablet at 04/29/12 1216  . DISCONTD: metoprolol succinate (TOPROL-XL) 24 hr tablet 25 mg  25 mg Oral QHS Mathis Dad, MD   25 mg at 04/28/12 2213  . DISCONTD: multivitamin with minerals tablet 1 tablet  1 tablet Oral Daily Mathis Dad, MD   1 tablet at 04/29/12 1214  . DISCONTD: nitroGLYCERIN (NITROSTAT) SL tablet 0.4 mg  0.4 mg Sublingual Q5 Min x 3 PRN Mathis Dad, MD      . DISCONTD: ondansetron [facility]) injection 4 mg  4 mg Intravenous Q6H PRN Mathis Dad, MD      . DISCONTD: ondansetron (ZOFRAN) tablet 4 mg  4 mg Oral Q6H PRN Mathis Dad, MD      . DISCONTD: pantoprazole (PROTONIX) EC  tablet 40 mg  40 mg Oral Daily Elon Jester  Benita Gutter, MD   40 mg at 04/29/12 1215  . DISCONTD: polyethylene glycol (MIRALAX / GLYCOLAX) packet 17 g  17 g Oral Daily Mathis Dad, MD   17 g at 04/28/12 1143  . DISCONTD: polyvinyl alcohol (LIQUIFILM TEARS) 1.4 % ophthalmic solution 1 drop  1 drop Both Eyes TID PRN Ginnie Smart, MD      . DISCONTD: sertraline (ZOLOFT) tablet 100 mg  100 mg Oral Daily Mathis Dad, MD   100 mg at 04/29/12 1214  . DISCONTD: topiramate (TOPAMAX) tablet 50 mg  50 mg Oral BID Mathis Dad, MD   50 mg at 04/29/12 1214  . DISCONTD: traMADol (ULTRAM) tablet 50 mg  50 mg Oral Q6H PRN Belia Heman, MD   50 mg at 04/28/12 1955    Allergies Gabapentin and Other  Family History: No family history on file.  Social History: History   Social History  . Marital Status: Widowed    Spouse Name: N/A    Number of Children: N/A  . Years of Education: N/A   Occupational History  . Not on file.   Social History Main Topics  . Smoking status: Former Games developer  . Smokeless tobacco: Not on file  . Alcohol Use: No  . Drug Use: No  . Sexually Active:    Other Topics Concern  . Not on file   Social History Narrative  . No narrative on file    Electrocardiogram:  10/19  NSR normal ECG  Assessment and Plan

## 2012-04-30 NOTE — Assessment & Plan Note (Signed)
Continue antibiotics  F/U with pulmonary given abnormalities on CT

## 2012-04-30 NOTE — Assessment & Plan Note (Signed)
Well controlled.  Continue current medications and low sodium Dash type diet.    

## 2012-04-30 NOTE — Assessment & Plan Note (Signed)
Cholesterol is at goal.  Continue current dose of statin and diet Rx.  No myalgias or side effects.  F/U  LFT's in 6 months. Lab Results  Component Value Date   LDLCALC 77 04/27/2012

## 2012-04-30 NOTE — Assessment & Plan Note (Signed)
No claudication.  F/U at [facility].  Femoral bruits and previous stents.  On Plavix presumed for PVD

## 2012-04-30 NOTE — Patient Instructions (Addendum)
Your physician recommends that you schedule a follow-up appointment in: AS NEEDED  Your physician recommends that you continue on your current medications as directed. Please refer to the Current Medication list given to you today.   You have been referred to PULMONARY  DX LUNG INFECTION AND  PULMONARY NODULES

## 2012-04-30 NOTE — Assessment & Plan Note (Signed)
F/U vascular for duplex q 6 months ASA

## 2012-04-30 NOTE — Assessment & Plan Note (Signed)
Resoved normal myovue no need for further w/u

## 2012-05-24 ENCOUNTER — Ambulatory Visit (INDEPENDENT_AMBULATORY_CARE_PROVIDER_SITE_OTHER): Payer: Medicare Other | Admitting: Pulmonary Disease

## 2012-05-24 ENCOUNTER — Encounter: Payer: Self-pay | Admitting: Pulmonary Disease

## 2012-05-24 VITALS — BP 118/62 | HR 69 | Temp 98.1°F | Ht 60.0 in | Wt 120.6 lb

## 2012-05-24 DIAGNOSIS — R0609 Other forms of dyspnea: Secondary | ICD-10-CM

## 2012-05-24 DIAGNOSIS — R918 Other nonspecific abnormal finding of lung field: Secondary | ICD-10-CM

## 2012-05-24 DIAGNOSIS — R0600 Dyspnea, unspecified: Secondary | ICD-10-CM

## 2012-05-24 NOTE — Patient Instructions (Addendum)
The spots on your lung are very tiny, and more than likely are inflammatory.  You need to have a followup scan in one year. Will get the results of your breathing studies from [facility] to see if you have copd or not.  Will call you to discuss. Stay as active as possible.

## 2012-05-24 NOTE — Progress Notes (Signed)
  Subjective:    Patient ID: [name] [name], female    DOB: Apr 20, 1941, 71 y.o.   MRN: [id number]  HPI The patient is a 71 year old female who been asked to see for pulmonary nodules and also dyspnea on exertion.  She has had a recent CT of her chest that showed very tiny nodules scattered in both lung fields, and also what appears to be possible bronchiectasis in the right lower lobe.  The patient has an extensive smoking history, but quit approximately one year ago.  The patient also has had breathing test recently, and it is unknown if she had airflow obstruction or not.  We will need to get the studies for evaluation.  The patient states that she has chronic dyspnea on exertion, and will get winded bringing groceries in from the car or walking up one flight of stairs.  She describes a 1-2 block dyspnea on exertion at a moderate pace on flat ground.  She thinks albuterol does help her when she has difficulties.  She denies any significant cough, congestion, or mucus production.  The patient tells me that her health maintenance is completely up to date, and she has never lived in areas endemic for fungi.  She has no history of TB exposure, but tells me she has never had a PPD placed.   Review of Systems  Constitutional: Negative for fever and unexpected weight change.  HENT: Positive for congestion and rhinorrhea. Negative for ear pain, nosebleeds, sore throat, sneezing, trouble swallowing, dental problem, postnasal drip and sinus pressure.   Eyes: Negative for redness and itching.  Respiratory: Positive for shortness of breath and wheezing. Negative for cough and chest tightness.   Cardiovascular: Positive for palpitations and leg swelling.  Gastrointestinal: Negative for nausea and vomiting.  Genitourinary: Negative for dysuria.  Musculoskeletal: Negative for joint swelling.  Skin: Negative for rash.  Neurological: Negative for headaches.  Hematological: Bruises/bleeds easily.    Psychiatric/Behavioral: Positive for dysphoric mood. The patient is nervous/anxious.        Objective:   Physical Exam Constitutional:  Well developed, no acute distress  HENT:  Nares patent without discharge  Oropharynx without exudate, palate and uvula are normal  Eyes:  Perrla, eomi, no scleral icterus  Neck:  No JVD, no TMG  Cardiovascular:  Normal rate, regular rhythm, no rubs or gallops.  No murmurs        Intact distal pulses  Pulmonary :  Mildly decreased breath sounds, no stridor or respiratory distress   No rales, rhonchi, or wheezing  Abdominal:  Soft, nondistended, bowel sounds present.  No tenderness noted.   Musculoskeletal:  No lower extremity edema noted.  Lymph Nodes:  No cervical lymphadenopathy noted  Skin:  No cyanosis noted  Neurologic:  Alert, appropriate, moves all 4 extremities without obvious deficit.         Assessment & Plan:

## 2012-05-24 NOTE — Assessment & Plan Note (Signed)
The patient has chronic dyspnea with more moderate to heavy exertion.  She has a long history of tobacco abuse, but quit smoking one year ago.  She has had recent PFTs, but the results are not available to me.  We'll need to get the results so that we can evaluate further.

## 2012-05-24 NOTE — Assessment & Plan Note (Signed)
The patient has multiple very small bilateral nodules on chest CT, and these appear more inflammatory than anything else.  She is at risk for bronchogenic cancer with her history of smoking, and by Fleischner criteria needs a followup in one year.

## 2012-06-07 ENCOUNTER — Encounter: Payer: Self-pay | Admitting: Pulmonary Disease

## 2012-06-07 ENCOUNTER — Telehealth: Payer: Self-pay | Admitting: Pulmonary Disease

## 2012-06-07 NOTE — Telephone Encounter (Signed)
Pt notified of results per Dr. Shelle Iron and verbalized understanding.

## 2012-06-07 NOTE — Telephone Encounter (Signed)
Please let pt know that I have gotten her breathing studies, and they do not show any COPD.  Good news.  I do not see anything currently from a lung standpoint to definitely explain her shortness of breath.  Would work on conditioning.  Make sure you call in a year so we can get your followup ct chest to make sure your spots are stable.

## 2012-09-16 ENCOUNTER — Other Ambulatory Visit (HOSPITAL_COMMUNITY): Payer: Self-pay | Admitting: Neurosurgery

## 2012-09-16 DIAGNOSIS — R1310 Dysphagia, unspecified: Secondary | ICD-10-CM

## 2012-09-16 DIAGNOSIS — M47812 Spondylosis without myelopathy or radiculopathy, cervical region: Secondary | ICD-10-CM

## 2012-09-19 ENCOUNTER — Ambulatory Visit (HOSPITAL_COMMUNITY): Payer: Medicare Other | Attending: Neurosurgery

## 2012-09-19 ENCOUNTER — Other Ambulatory Visit (HOSPITAL_COMMUNITY): Payer: Medicare Other

## 2012-09-24 ENCOUNTER — Ambulatory Visit (HOSPITAL_COMMUNITY): Payer: Medicare Other

## 2012-09-24 ENCOUNTER — Other Ambulatory Visit (HOSPITAL_COMMUNITY): Payer: Medicare Other

## 2012-09-25 ENCOUNTER — Other Ambulatory Visit (HOSPITAL_COMMUNITY): Payer: Self-pay | Admitting: Neurosurgery

## 2012-09-25 DIAGNOSIS — M47812 Spondylosis without myelopathy or radiculopathy, cervical region: Secondary | ICD-10-CM

## 2012-09-27 ENCOUNTER — Ambulatory Visit (HOSPITAL_COMMUNITY)
Admission: RE | Admit: 2012-09-27 | Discharge: 2012-09-27 | Disposition: A | Discharge status: Home / Self Care | Payer: Medicare Other | Source: Ambulatory Visit | Attending: Neurosurgery | Admitting: Neurosurgery

## 2012-09-27 DIAGNOSIS — I739 Peripheral vascular disease, unspecified: Secondary | ICD-10-CM | POA: Insufficient documentation

## 2012-09-27 DIAGNOSIS — E7800 Pure hypercholesterolemia, unspecified: Secondary | ICD-10-CM | POA: Insufficient documentation

## 2012-09-27 DIAGNOSIS — N183 Chronic kidney disease, stage 3 unspecified: Secondary | ICD-10-CM | POA: Insufficient documentation

## 2012-09-27 DIAGNOSIS — F329 Major depressive disorder, single episode, unspecified: Secondary | ICD-10-CM | POA: Insufficient documentation

## 2012-09-27 DIAGNOSIS — I129 Hypertensive chronic kidney disease with stage 1 through stage 4 chronic kidney disease, or unspecified chronic kidney disease: Secondary | ICD-10-CM | POA: Insufficient documentation

## 2012-09-27 DIAGNOSIS — F3289 Other specified depressive episodes: Secondary | ICD-10-CM | POA: Insufficient documentation

## 2012-09-27 DIAGNOSIS — F411 Generalized anxiety disorder: Secondary | ICD-10-CM | POA: Insufficient documentation

## 2012-09-27 DIAGNOSIS — M129 Arthropathy, unspecified: Secondary | ICD-10-CM | POA: Insufficient documentation

## 2012-09-27 DIAGNOSIS — R1319 Other dysphagia: Secondary | ICD-10-CM | POA: Insufficient documentation

## 2012-09-27 DIAGNOSIS — M47812 Spondylosis without myelopathy or radiculopathy, cervical region: Secondary | ICD-10-CM

## 2012-09-27 NOTE — Procedures (Signed)
Objective Swallowing Evaluation: Modified Barium Swallowing Study  Patient Details  Name: [name] [name] MRN: [id number] Date of Birth: Apr 12, 1941  Today's Date: 09/27/2012 Time: 1110-1140 SLP Time Calculation (min): 30 min  Past Medical History:  Past Medical History  Diagnosis Date  . Hypertension   . Heart murmur   . Pneumonia   . Forgetfulness   . Anxiety   . Mental disorder   . Depression   . Psoriasis   . Arthritis   . Hypercholesteremia   . Congenital absence of one kidney     Pt has right kidney only  . Neuromuscular disorder   . Carpal tunnel syndrome, bilateral   . Chronic kidney disease     CKD stage III, absence of left kidney  . Peripheral vascular disease     Dr. Carollee Massed; right external iliac stent '06 with angioplasty '13   Past Surgical History:  Past Surgical History  Procedure Laterality Date  . Abdominal hysterectomy      Partial   . Appendectomy    . Tonsillectomy    . Eye surgery    . Iliac artery stent    . Anterior cervical decomp/discectomy fusion  04/19/2012    Procedure: ANTERIOR CERVICAL DECOMPRESSION/DISCECTOMY FUSION 2 LEVELS;  Surgeon: Carmela Hurt, MD;  Location: [facility];  Service: Neurosurgery;  Laterality: N/A;  Cervical four-five,Cervical five-six anterior cervical decompression with fusion plating and bonegraft possible posterior cervical decompression   HPI:  Pt is a 72 year old female arriving for an outpatient MBS. She had an ACDF in 10/13. She reports frequent coughing with liquids as well as a sensation of food/pills getting stuck. She reports that she often expectorates mucous with belching. She reports that the globus was present prior to her surgery but that it has gotten worse since then.      Assessment / Plan / Recommendation Clinical Impression  Dysphagia Diagnosis: Mild cervical esophageal phase dysphagia;Suspected primary esophageal dysphagia Clinical impression: Pt presents with a mild cervical esophageal phase  dysphagia due to presence of ACDF hardware impinging opening of UES. The passage of all boluses is slightly impeded by pressure from the hardware on the UES. As bolus hesistates above the UES, it threatens to penetrate the airway. Fortunately the pt compensates by taking very small sips and initiating laryngeal elevation and airway closure prior to transiting the bolus. There is slight penetration into the vestibule that is immeidately expelled. Pts sensation and strength are WNL. Unfortunately, appearance of a primary esophageal dysphagia is causing the pt significant discomfort and episodes of regurgitation to the pharynx. Recommend the pt f/u with MD to consider esophageal assessment or referral to GI. At this time recommend the pt continue current diet and follow basic esophageal precautions provided by SLP.     Treatment Recommendation       Diet Recommendation Regular;Thin liquid   Liquid Administration via: Cup;Straw Medication Administration: Whole meds with liquid Supervision: Patient able to self feed Compensations: Slow rate;Small sips/bites Postural Changes and/or Swallow Maneuvers: Seated upright 90 degrees;Upright 30-60 min after meal    Other  Recommendations Recommended Consults: Consider GI evaluation;Consider esophageal assessment   Follow Up Recommendations  None    Frequency and Duration        Pertinent Vitals/Pain NA    SLP Swallow Goals     General HPI: Pt is a 72 year old female arriving for an outpatient MBS. She had an ACDF in 10/13. She reports frequent coughing with liquids as well as  a sensation of food/pills getting stuck. She reprots that she often expectorates mucous with belching. She reports that the globus was present prior to her surgery but that it has gotten worse since then.  Type of Study: Modified Barium Swallowing Study Reason for Referral: Objectively evaluate swallowing function Diet Prior to this Study: Regular;Thin liquids Temperature  Spikes Noted: No Respiratory Status: Room air History of Recent Intubation: No Behavior/Cognition: Alert;Cooperative;Pleasant mood Oral Cavity - Dentition: Adequate natural dentition Oral Motor / Sensory Function: Within functional limits Self-Feeding Abilities: Able to feed self Patient Positioning: Upright in chair Baseline Vocal Quality: Clear Volitional Cough: Strong Volitional Swallow: Able to elicit Anatomy: Other (Comment) (cervical fusion)    Reason for Referral Objectively evaluate swallowing function   Oral Phase Oral Preparation/Oral Phase Oral Phase: WFL   Pharyngeal Phase Pharyngeal Phase Pharyngeal Phase: Impaired Pharyngeal - Thin Pharyngeal - Thin Cup: Penetration/Aspiration during swallow Penetration/Aspiration details (thin cup): Material enters airway, remains ABOVE vocal cords then ejected out Pharyngeal - Thin Straw: Penetration/Aspiration during swallow Penetration/Aspiration details (thin straw): Material enters airway, remains ABOVE vocal cords then ejected out Pharyngeal - Solids Pharyngeal - Puree: Within functional limits Pharyngeal - Regular: Within functional limits Pharyngeal - Pill: Within functional limits  Cervical Esophageal Phase    GO    Cervical Esophageal Phase Cervical Esophageal Phase: Impaired Cervical Esophageal Phase - Comment Cervical Esophageal Comment: see impression statement    Functional Assessment Tool Used: clinical judgement Functional Limitations: Swallowing Swallow Current Status (U9811): At least 1 percent but less than 20 percent impaired, limited or restricted Swallow Goal Status [id number]): At least 1 percent but less than 20 percent impaired, limited or restricted Swallow Discharge Status [id number]): At least 1 percent but less than 20 percent impaired, limited or restricted   [facility], [place] CCC-SLP [phone]  [name] [name] 09/27/2012, 1:52 PM

## 2012-10-18 ENCOUNTER — Other Ambulatory Visit: Payer: Self-pay | Admitting: *Deleted

## 2012-10-18 MED ORDER — HYDROCODONE-ACETAMINOPHEN 5-325 MG PO TABS: ORAL_TABLET | ORAL | Status: DC

## 2012-10-26 ENCOUNTER — Non-Acute Institutional Stay (SKILLED_NURSING_FACILITY): Payer: Medicare Other | Admitting: Internal Medicine

## 2012-10-26 DIAGNOSIS — M353 Polymyalgia rheumatica: Secondary | ICD-10-CM

## 2012-10-26 DIAGNOSIS — R55 Syncope and collapse: Secondary | ICD-10-CM

## 2012-10-26 DIAGNOSIS — R0789 Other chest pain: Secondary | ICD-10-CM

## 2012-10-26 NOTE — Progress Notes (Signed)
Patient ID: [name] [name], female   DOB: Oct 24, 1940, 72 y.o.   MRN: [id number] Chief complaint; admission to SNF he'll stay at [facility] 10/15/2012 through 10/18/2012.  History; this is a 72 year old woman who lives at home with her daughter. She came to the emergency room complaining of severe left-sided chest pain. The patient ruled out with serial cardiac enzymes. She underwent a stress test, which was negative for reversibility. During the hospitalization. She was noted to have a questionable syncopal spell in which she fell backwards during therapy. She underwent monitoring and had no arrhythmia. She underwent carotid ultrasound, which was negative. CT scan of the head was negative. EEG showed no eleptiform activity.   The patient was recently diagnosed with polymyalgia rheumatica. I believe by her rheumatologist Dr. Sharmon Revere. This was based on a 4 to six-week history of progressive pain in her neck, shoulders, hips, and proximal thighs , together with stiffness and "tightness ". In fact, she was given an injection of steroids. The day before this hospitalization and has since been on prednisone 20 mg. She feels a lot better. In terms of her musculoskeletal complaints.   Past medical history;  #1 atypical chest pain negative cardiac stress test. #2 syncopal or presyncopal spell while in hospital, see discussion above. #3 history of pulmonary nodules felt to be stable. Likely a post inflammatory response. Reviewed by Dr. Shelle Iron #4 history of cervical spondylosis, status post anterior cervical decompression in October 2013. Patient states she has had a good response. Her neck pain and mobility has improved. #5 anxiety and depression. #5 hypertension. #6 list history of COPD, however, apparently PFTs have not shown airway obstruction, holding a telephone note from Dr. Shelle Iron. #7 recent diagnosis of polymyalgia rheumatica with a good response to steroids.  Past surgical;  appendectomy, hysterectomy, cataract surgery, tonsillectomy, anterior, cervical decompression.  Medications; trazodone 150 mg each bedtime when necessary, tramadol, 100 mg every 6 when necessary, Norco 5/325 one by mouth every 6 hours when necessary, Septra DS one by mouth twice a day for 5 days, Actonel, 150, one tablet monthly, Protonix 40 mg by mouth, fetal rate 160 mg daily, metoprolol, ER 25 mg daily, Norvasc 5 mg daily, Lasix, 75 mg daily, prednisone, 20 mg daily, Zoloft, 200 mg by mouth daily, Topamax, 25 mg by mouth daily , and 50 at bedtime, Wellbutrin 150 mg by mouth daily, ASA 81 by mouth daily,  Socially, the patient is widowed. Lives with her daughter, who is ill with apparent chronic hepatitis C. Ex smoker, quitting a year ago. No alcohol use. Has a walker at home. States she has been independent with ADLs and IADL.  Family history heart disease, cancer, diabetes, osteoporosis, rheumatoid arthritis, benign tremor.  Review of systems; Respiratory some exertional shortness of breath or this does not appear to be different from baseline. Cardiac no chest pain currently, and no history of exertional chest GI no nausea or vomiting. Musculoskeletal states her pain and stiffness is much better since initiating the steroids  Physical exam;  Gen. patient looks well. No overt distress. No overt depression. Respiratory clear entry bilaterally no crackles or wheezes were reading is Cardiac regular rate and rhythm. No murmurs. Abdomen no liver no spleen no tenderness. Musculoskeletal no active arthritis. Neurologic-gait. She is able to get up from a chair, her gait is nondescript. Romberg's negative balance testing normal. She has a walker doesn't appear to need it. Mental status-I see no overt depression or delirium. Cognition seems intact.  Impression/plan #1 atypical chest pain. There is no evidence of this currently. Cardiac stress test was negative. #2 presyncopal spell. This was  worked up in the hospital . Clementeen Hoof history of present illness]. #3 recent diagnosis of polymyalgia rheumatica in fact, she was started I believe, with a steroid injection just before this hospitalization. She is now on prednisone 20 mg and reporting a great improvement in her symptoms. I am not going to alter her prednisone as I think she should be going home next week and follows with her own rheumatologist.  #4 history of cervical decompression. No long tract issues here. #5 obviously, a complex psychiatric history, I have no plans to alter these medications as I have no information and her stay will be short.  I suspect the patient can be discharged home this week.

## 2012-10-28 ENCOUNTER — Non-Acute Institutional Stay (SKILLED_NURSING_FACILITY): Payer: Medicare Other | Admitting: Adult Health

## 2012-10-28 ENCOUNTER — Encounter: Payer: Self-pay | Admitting: Adult Health

## 2012-10-28 DIAGNOSIS — R911 Solitary pulmonary nodule: Secondary | ICD-10-CM

## 2012-10-28 DIAGNOSIS — M353 Polymyalgia rheumatica: Secondary | ICD-10-CM

## 2012-10-28 DIAGNOSIS — R079 Chest pain, unspecified: Secondary | ICD-10-CM

## 2012-10-28 NOTE — Progress Notes (Signed)
Patient ID: [name] [name], female   DOB: [birth year], 72 y.o.   MRN: [id number]  Chief Complaint  Patient presents with  . Discharge Note   HPI:   She is being discharged to home. She has recently been diagnosed with polymyalgia rheumatica and is on long term prednisone. She had been hospitalized for chest pain and debility. She has done well with her short term therapy and will is ready for discharge  Home with home health for pt/ot/nursing. Will not need dme; will need prescriptions written.  No problem-specific assessment & plan notes found for this encounter.   Past Medical History  Diagnosis Date  . Hypertension   . Heart murmur   . Pneumonia   . Forgetfulness   . Anxiety   . Mental disorder   . Depression   . Psoriasis   . Arthritis   . Hypercholesteremia   . Congenital absence of one kidney     Pt has right kidney only  . Neuromuscular disorder   . Carpal tunnel syndrome, bilateral   . Chronic kidney disease     CKD stage III, absence of left kidney  . Peripheral vascular disease     Dr. Carollee Massed; right external iliac stent '06 with angioplasty '13    Past Surgical History  Procedure Laterality Date  . Abdominal hysterectomy      Partial   . Appendectomy    . Tonsillectomy    . Eye surgery    . Iliac artery stent    . Anterior cervical decomp/discectomy fusion  04/19/2012    Procedure: ANTERIOR CERVICAL DECOMPRESSION/DISCECTOMY FUSION 2 LEVELS;  Surgeon: Carmela Hurt, MD;  Location: [facility];  Service: Neurosurgery;  Laterality: N/A;  Cervical four-five,Cervical five-six anterior cervical decompression with fusion plating and bonegraft possible posterior cervical decompression    VITAL SIGNS BP 118/71  Pulse 88  Ht 5' (1.524 m)  Wt 125 lb 12.8 oz (57.063 kg)  BMI 24.57 kg/m2   Patient's Medications  New Prescriptions   No medications on file  Previous Medications   AMLODIPINE (NORVASC) 5 MG TABLET    Take 5 mg by mouth daily.    ASPIRIN 81 MG CHEWABLE  TABLET    Chew 1 tablet (81 mg total) by mouth daily.   BUPROPION (WELLBUTRIN XL) 150 MG 24 HR TABLET    Take 150 mg by mouth daily.   CLOPIDOGREL (PLAVIX) 75 MG TABLET    Take 75 mg by mouth daily.   FENOFIBRATE 160 MG TABLET    Take 1 tablet by mouth daily.   HYDROCODONE-ACETAMINOPHEN (NORCO/VICODIN) 5-325 MG PER TABLET    Take one  tablet every 6 hours as needed Take 2 tablets every 6 hours as needed   METOPROLOL SUCCINATE (TOPROL-XL) 25 MG 24 HR TABLET    Take 25 mg by mouth at bedtime.   MULTIPLE VITAMIN (MULTIVITAMIN WITH MINERALS) TABS    Take 1 tablet by mouth daily.   PANTOPRAZOLE (PROTONIX) 40 MG TABLET    Take 40 mg by mouth daily.   PREDNISONE (DELTASONE) 20 MG TABLET    Take 20 mg by mouth daily.   RISEDRONATE (ACTONEL) 150 MG TABLET    Take 150 mg by mouth every 30 (thirty) days. with water on empty stomach, nothing by mouth or lie down for next 30 minutes.   SERTRALINE (ZOLOFT) 100 MG TABLET    Take 200 mg by mouth daily.    TOPIRAMATE (TOPAMAX) 50 MG TABLET    Take 25-50  mg by mouth 2 (two) times daily. Take 25 mg in the am and 50 mg in the pm   TRAMADOL (ULTRAM) 50 MG TABLET    Take 100 mg by mouth every 6 (six) hours as needed. For pain   TRAZODONE (DESYREL) 50 MG TABLET    Take 50 mg by mouth at bedtime. As needed  Modified Medications   No medications on file  Discontinued Medications   ACETAMINOPHEN (TYLENOL) 325 MG TABLET    Take 650 mg by mouth every 6 (six) hours as needed. For pain   ALBUTEROL (PROVENTIL HFA;VENTOLIN HFA) 108 (90 BASE) MCG/ACT INHALER    Inhale 2 puffs into the lungs every 4 (four) hours as needed. For wheezing   ARIPIPRAZOLE (ABILIFY) 2 MG TABLET    Take 2 mg by mouth daily.   HYDROXYPROPYL METHYLCELLULOSE (ISOPTO TEARS) 2.5 % OPHTHALMIC SOLUTION    Place 1 drop into both eyes 3 (three) times daily as needed. Dry eyes   L-METHYLFOLATE (DEPLIN) 15 MG TABS    Take 15 mg by mouth daily.   POLYETHYLENE GLYCOL (MIRALAX / GLYCOLAX) PACKET    Take 17 g by  mouth daily.   PROMETHAZINE (PHENERGAN) 12.5 MG TABLET    Take 12.5 mg by mouth every 6 (six) hours as needed.    SIGNIFICANT DIAGNOSTIC EXAMS     Component Value Date/Time   ALBUMIN 3.8 04/27/2012 1257   AST 19 04/27/2012 1257   ALT 9 04/27/2012 1257   ALKPHOS 74 04/27/2012 1257   BILITOT 0.3 04/27/2012 1257       Component Value Date/Time   BUN 20 04/27/2012 1257   GLUCOSE 87 04/27/2012 1257   CREATININE 1.02 04/27/2012 1257   K 4.2 04/27/2012 1257   NA 138 04/27/2012 1257       Component Value Date/Time   WBC 5.8 04/27/2012 1257   RBC 3.21* 04/27/2012 1257   HGB 10.8* 04/27/2012 1257   HCT 31.2* 04/27/2012 1257   PLT 203 04/27/2012 1257   MCV 97.2 04/27/2012 1257     Review of Systems  Constitutional: Negative for fever and weight loss.  HENT: Negative for neck pain.   Respiratory: Negative for cough and shortness of breath.   Cardiovascular: Negative for chest pain and leg swelling.  Gastrointestinal: Negative for heartburn, abdominal pain and constipation.  Musculoskeletal: Negative for myalgias and joint pain.  Skin: Negative for rash.  Neurological: Negative for dizziness and headaches.  Psychiatric/Behavioral: Negative for depression. The patient does not have insomnia.      Physical Exam  Constitutional: She is oriented to person, place, and time. She appears well-nourished.  Neck: Neck supple.  Cardiovascular: Normal rate, regular rhythm and intact distal pulses.   Respiratory: Effort normal and breath sounds normal.  GI: Soft. Bowel sounds are normal.  Musculoskeletal: Normal range of motion.  Neurological: She is alert and oriented to person, place, and time.  Skin: Skin is warm.  Psychiatric: She has a normal mood and affect.       ASSESSMENT/ PLAN:    Will discharge her to home with home health for pt/ot/nursing; will not need dme; will need prescriptions written.   Time spent with patient 40 minutes

## 2012-11-04 DIAGNOSIS — J441 Chronic obstructive pulmonary disease with (acute) exacerbation: Secondary | ICD-10-CM

## 2012-11-04 DIAGNOSIS — M353 Polymyalgia rheumatica: Secondary | ICD-10-CM

## 2012-11-04 DIAGNOSIS — F329 Major depressive disorder, single episode, unspecified: Secondary | ICD-10-CM

## 2012-11-04 DIAGNOSIS — I10 Essential (primary) hypertension: Secondary | ICD-10-CM

## 2012-11-05 ENCOUNTER — Telehealth: Payer: Self-pay | Admitting: *Deleted

## 2012-11-05 NOTE — Telephone Encounter (Signed)
Gani called from [facility] and stated that  They will not be picking up patient for occupational services. Patient daughter is there to help with transfer assistant

## 2012-12-11 ENCOUNTER — Other Ambulatory Visit: Payer: Self-pay | Admitting: Adult Health

## 2013-05-14 DIAGNOSIS — F332 Major depressive disorder, recurrent severe without psychotic features: Secondary | ICD-10-CM | POA: Diagnosis present

## 2013-06-17 ENCOUNTER — Encounter: Payer: Self-pay | Admitting: Family Medicine

## 2013-06-17 ENCOUNTER — Ambulatory Visit (INDEPENDENT_AMBULATORY_CARE_PROVIDER_SITE_OTHER): Payer: Medicare Other | Admitting: Family Medicine

## 2013-06-17 VITALS — BP 157/99 | HR 86 | Temp 97.6°F | Ht 60.5 in | Wt 143.0 lb

## 2013-06-17 DIAGNOSIS — I10 Essential (primary) hypertension: Secondary | ICD-10-CM

## 2013-06-17 DIAGNOSIS — I739 Peripheral vascular disease, unspecified: Secondary | ICD-10-CM

## 2013-06-17 MED ORDER — HYDROCHLOROTHIAZIDE 12.5 MG PO TABS: 12.5000 mg | ORAL_TABLET | Freq: Every day | ORAL | Status: DC

## 2013-06-17 NOTE — Assessment & Plan Note (Addendum)
Uncontrolled currently, likely due to recently discontinuing beta blocker. Started HCTZ at 12-1/2 mg today, asked patient to followup for 6 weeks for recheck her blood pressure and to review her labs. Slight edema may be due to amlodipine, after reading control will consider changing medication due to the edema. Requested that she keep track of her blood pressures at home and returns to the clinic with them on followup. Requested fasting labs today: Lipid panel, CBC, CMP, TSH

## 2013-06-17 NOTE — Progress Notes (Signed)
Patient ID: [name] [name], female   DOB: [birth year], 72 y.o.   MRN: [id number]  Kevin Fenton, MD Phone: [phone]  Subjective:  Chief complaint-noted  72 year old medically complicated female here to establish care. She states that she is transitioning care from [facility].  She states that her recent struggle with a rash that is now improving.  We reviewed her past medical history, past surgical history, and social history in detail.  She states her blood pressures been elevated recently mostly about the 140s over 90s. She has had a rash she's been struggling with for over a year now. She states that it's improved since they stopped metoprolol, but now her blood pressure is higher and she is having intermittent palpitations. She describes the palpitations as a sensation of a few extra beats. She denies any dyspnea, chest pain, weakness, or dizziness during these times.   She has tried atorvastatin before and had a reaction involving rash and swelling her tongue.  She's been diagnosed with polymyalgia rheumatica which has improved since starting chronic prednisone. She takes approximately 2 Vicodin daily for low back pain. - ROS- No fever, chills, sweats No dyspnea No chest pain,  Positive swelling, positive palpitations No nausea, vomiting, diarrhea Positive for extremity edema No weakness, or numbness. Positive intermittent claudication, however this is improved from before her peripheral intervention.  Past Medical History Patient Active Problem List   Diagnosis Date Noted  . Pulmonary nodules 05/24/2012  . DOE (dyspnea on exertion) 05/24/2012  . PVD (peripheral vascular disease) 04/30/2012  . Carotid bruit 04/30/2012  . Dyspnea 04/30/2012  . Hypertension 04/27/2012  . Chest pain 04/27/2012  . Anxiety and depression 04/27/2012  . Hyperlipemia 04/27/2012  . Cervical spondylosis with radiculopathy 04/19/2012    Medications- reviewed and  updated Current Outpatient Prescriptions  Medication Sig Dispense Refill  . amLODipine (NORVASC) 5 MG tablet Take 5 mg by mouth daily.       [name] aspirin 81 MG chewable tablet Chew 1 tablet (81 mg total) by mouth daily.  30 tablet  0  . clopidogrel (PLAVIX) 75 MG tablet Take 75 mg by mouth daily.      . cyclobenzaprine (FLEXERIL) 10 MG tablet Take 10 mg by mouth 3 (three) times daily as needed for muscle spasms.      . fenofibrate 160 MG tablet Take 1 tablet by mouth daily.      [name] HYDROcodone-acetaminophen (NORCO/VICODIN) 5-325 MG per tablet Take one  tablet every 6 hours as needed Take 2 tablets every 6 hours as needed  240 tablet  5  . Multiple Vitamin (MULTIVITAMIN WITH MINERALS) TABS Take 1 tablet by mouth daily.      . pantoprazole (PROTONIX) 40 MG tablet Take 40 mg by mouth daily.      . predniSONE (DELTASONE) 20 MG tablet Take 20 mg by mouth daily.      . risedronate (ACTONEL) 150 MG tablet Take 150 mg by mouth every 30 (thirty) days. with water on empty stomach, nothing by mouth or lie down for next 30 minutes.      . sertraline (ZOLOFT) 100 MG tablet Take 200 mg by mouth daily.       . traMADol (ULTRAM) 50 MG tablet Take 100 mg by mouth every 6 (six) hours as needed. For pain      . traZODone (DESYREL) 50 MG tablet Take 50 mg by mouth at bedtime. As needed      . hydrochlorothiazide (HYDRODIURIL) 12.5 MG tablet Take 1  tablet (12.5 mg total) by mouth daily.  90 tablet  3   No current facility-administered medications for this visit.    Objective: BP 157/99  Pulse 86  Temp(Src) 97.6 F (36.4 C) (Oral)  Ht 5' 0.5" (1.537 m)  Wt 143 lb (64.864 kg)  BMI 27.46 kg/m2 Gen: NAD, alert, cooperative with exam HEENT: NCAT, MMM CV: RRR, good S1/S2, 1-2/6 systolic murmur, BL carotid bruit R >L Resp: CTABL, no wheezes, non-labored Abd: SNTND, BS present, no guarding or organomegaly Ext: 1 + BL LE edema Neuro: Alert and oriented, No gross deficits  Labs the patient brought in to the office  from 10/14/2012 show CRP of 1.7, sedimentation rate 47, BUN 15, creatinine 1.1, Labs from the same-day that are within normal limits CBC, hepatitis B surface antigen, hepatitis C IgG, uric acid, anti-CCP, ANA, and rheumatoid factor.  Assessment/Plan:  Hypertension Uncontrolled currently, likely due to recently discontinuing beta blocker. Started HCTZ at 12-1/2 mg today, asked patient to followup for 74 weeks for recheck her blood pressure and to review her labs. Slight edema may be due to amlodipine, after reading control will consider changing medication due to the edema. Requested that she keep track of her blood pressures at home and returns to the clinic with them on followup.    PVD (peripheral vascular disease) Some intermittent pain now but seems to be more consistent with her low back pain than claudication Continue plavix,  S/p stent placement in R leg Has vascular surgeon at [facility].      Orders Placed This Encounter  Procedures  . Comprehensive metabolic panel    Standing Status: Future     Number of Occurrences:      Standing Expiration Date: 06/17/2014  . Lipid Panel    Standing Status: Future     Number of Occurrences:      Standing Expiration Date: 06/17/2014  . CBC with Differential    Standing Status: Future     Number of Occurrences:      Standing Expiration Date: 06/17/2014    Meds ordered this encounter  Medications  . cyclobenzaprine (FLEXERIL) 10 MG tablet    Sig: Take 10 mg by mouth 3 (three) times daily as needed for muscle spasms.  . hydrochlorothiazide (HYDRODIURIL) 12.5 MG tablet    Sig: Take 1 tablet (12.5 mg total) by mouth daily.    Dispense:  90 tablet    Refill:  3

## 2013-06-17 NOTE — Addendum Note (Signed)
Addended by: Elenora Gamma on: 06/17/2013 05:49 PM   Modules accepted: Orders

## 2013-06-17 NOTE — Assessment & Plan Note (Signed)
Some intermittent pain now but seems to be more consistent with her low back pain than claudication Continue plavix,  S/p stent placement in R leg Has vascular surgeon at [facility].

## 2013-06-17 NOTE — Patient Instructions (Signed)
It was great to meet you  Please come back in 3-4 weeks.   Keep track of our blood pressure  Hypertension As your heart beats, it forces blood through your arteries. This force is your blood pressure. If the pressure is too high, it is called hypertension (HTN) or high blood pressure. HTN is dangerous because you may have it and not know it. High blood pressure may mean that your heart has to work harder to pump blood. Your arteries may be narrow or stiff. The extra work puts you at risk for heart disease, stroke, and other problems.  Blood pressure consists of two numbers, a higher number over a lower, 110/72, for example. It is stated as "110 over 72." The ideal is below 120 for the top number (systolic) and under 80 for the bottom (diastolic). Write down your blood pressure today. You should pay close attention to your blood pressure if you have certain conditions such as:  Heart failure.  Prior heart attack.  Diabetes  Chronic kidney disease.  Prior stroke.  Multiple risk factors for heart disease. To see if you have HTN, your blood pressure should be measured while you are seated with your arm held at the level of the heart. It should be measured at least twice. A one-time elevated blood pressure reading (especially in the Emergency Department) does not mean that you need treatment. There may be conditions in which the blood pressure is different between your right and left arms. It is important to see your caregiver soon for a recheck. Most people have essential hypertension which means that there is not a specific cause. This type of high blood pressure may be lowered by changing lifestyle factors such as:  Stress.  Smoking.  Lack of exercise.  Excessive weight.  Drug/tobacco/alcohol use.  Eating less salt. Most people do not have symptoms from high blood pressure until it has caused damage to the body. Effective treatment can often prevent, delay or reduce that  damage. TREATMENT  When a cause has been identified, treatment for high blood pressure is directed at the cause. There are a large number of medications to treat HTN. These fall into several categories, and your caregiver will help you select the medicines that are best for you. Medications may have side effects. You should review side effects with your caregiver. If your blood pressure stays high after you have made lifestyle changes or started on medicines,   Your medication(s) may need to be changed.  Other problems may need to be addressed.  Be certain you understand your prescriptions, and know how and when to take your medicine.  Be sure to follow up with your caregiver within the time frame advised (usually within two weeks) to have your blood pressure rechecked and to review your medications.  If you are taking more than one medicine to lower your blood pressure, make sure you know how and at what times they should be taken. Taking two medicines at the same time can result in blood pressure that is too low. SEEK IMMEDIATE MEDICAL CARE IF:  You develop a severe headache, blurred or changing vision, or confusion.  You have unusual weakness or numbness, or a faint feeling.  You have severe chest or abdominal pain, vomiting, or breathing problems. MAKE SURE YOU:   Understand these instructions.  Will watch your condition.  Will get help right away if you are not doing well or get worse. Document Released: 06/26/2005 Document Revised: 09/18/2011 Document Reviewed: 02/14/2008  [employer] Patient Information 2014 [employer].  

## 2013-06-27 ENCOUNTER — Other Ambulatory Visit (INDEPENDENT_AMBULATORY_CARE_PROVIDER_SITE_OTHER): Payer: Medicare HMO

## 2013-06-27 DIAGNOSIS — I739 Peripheral vascular disease, unspecified: Secondary | ICD-10-CM

## 2013-06-27 DIAGNOSIS — I10 Essential (primary) hypertension: Secondary | ICD-10-CM

## 2013-06-27 LAB — CBC WITH DIFFERENTIAL/PLATELET
Basophils Absolute: 0 10*3/uL (ref 0.0–0.1)
Basophils Relative: 0 % (ref 0–1)
Eosinophils Absolute: 0.1 10*3/uL (ref 0.0–0.7)
Eosinophils Relative: 2 % (ref 0–5)
HCT: 37.8 % (ref 36.0–46.0)
Hemoglobin: 13.3 g/dL (ref 12.0–15.0)
Lymphocytes Relative: 34 % (ref 12–46)
Lymphs Abs: 2.8 10*3/uL (ref 0.7–4.0)
MCH: 33.8 pg (ref 26.0–34.0)
MCHC: 35.2 g/dL (ref 30.0–36.0)
MCV: 96.2 fL (ref 78.0–100.0)
Monocytes Absolute: 0.8 10*3/uL (ref 0.1–1.0)
Monocytes Relative: 10 % (ref 3–12)
Neutro Abs: 4.5 10*3/uL (ref 1.7–7.7)
Neutrophils Relative %: 54 % (ref 43–77)
Platelets: 215 10*3/uL (ref 150–400)
RBC: 3.93 MIL/uL (ref 3.87–5.11)
RDW: 12.9 % (ref 11.5–15.5)
WBC: 8.2 10*3/uL (ref 4.0–10.5)

## 2013-06-27 LAB — COMPREHENSIVE METABOLIC PANEL
ALT: 12 U/L (ref 0–35)
AST: 19 U/L (ref 0–37)
Albumin: 4.2 g/dL (ref 3.5–5.2)
Alkaline Phosphatase: 52 U/L (ref 39–117)
BUN: 23 mg/dL (ref 6–23)
CO2: 25 mEq/L (ref 19–32)
Calcium: 9.6 mg/dL (ref 8.4–10.5)
Chloride: 104 mEq/L (ref 96–112)
Creat: 1.28 mg/dL — ABNORMAL HIGH (ref 0.50–1.10)
Glucose, Bld: 82 mg/dL (ref 70–99)
Potassium: 3.8 mEq/L (ref 3.5–5.3)
Sodium: 142 mEq/L (ref 135–145)
Total Bilirubin: 0.4 mg/dL (ref 0.3–1.2)
Total Protein: 6.3 g/dL (ref 6.0–8.3)

## 2013-06-27 LAB — LIPID PANEL
Cholesterol: 236 mg/dL — ABNORMAL HIGH (ref 0–200)
HDL: 76 mg/dL (ref >39)
LDL Cholesterol: 138 mg/dL — ABNORMAL HIGH (ref 0–99)
Total CHOL/HDL Ratio: 3.1 Ratio
Triglycerides: 112 mg/dL (ref <150)
VLDL: 22 mg/dL (ref 0–40)

## 2013-06-27 LAB — TSH: TSH: 1.418 u[IU]/mL (ref 0.350–4.500)

## 2013-06-27 NOTE — Progress Notes (Signed)
CMP,FLP,CBC WITH DIFF AND TSH DONE TODAY [name] [name]

## 2013-07-02 ENCOUNTER — Encounter: Payer: Self-pay | Admitting: Family Medicine

## 2013-07-28 ENCOUNTER — Encounter: Payer: Self-pay | Admitting: Family Medicine

## 2013-07-28 ENCOUNTER — Ambulatory Visit (INDEPENDENT_AMBULATORY_CARE_PROVIDER_SITE_OTHER): Payer: Medicare HMO | Admitting: Family Medicine

## 2013-07-28 ENCOUNTER — Other Ambulatory Visit: Payer: Self-pay

## 2013-07-28 VITALS — BP 132/83 | HR 94 | Temp 98.5°F | Ht 60.0 in | Wt 145.0 lb

## 2013-07-28 DIAGNOSIS — M4722 Other spondylosis with radiculopathy, cervical region: Secondary | ICD-10-CM

## 2013-07-28 DIAGNOSIS — Z1231 Encounter for screening mammogram for malignant neoplasm of breast: Secondary | ICD-10-CM

## 2013-07-28 DIAGNOSIS — R21 Rash and other nonspecific skin eruption: Secondary | ICD-10-CM

## 2013-07-28 DIAGNOSIS — I10 Essential (primary) hypertension: Secondary | ICD-10-CM

## 2013-07-28 DIAGNOSIS — R918 Other nonspecific abnormal finding of lung field: Secondary | ICD-10-CM

## 2013-07-28 DIAGNOSIS — M4712 Other spondylosis with myelopathy, cervical region: Secondary | ICD-10-CM

## 2013-07-28 MED ORDER — CHLORTHALIDONE 25 MG PO TABS: 25.0000 mg | ORAL_TABLET | Freq: Every day | ORAL | Status: DC

## 2013-07-28 MED ORDER — TRIAMCINOLONE ACETONIDE 0.1 % EX OINT: 1.0000 "application " | TOPICAL_OINTMENT | Freq: Two times a day (BID) | CUTANEOUS | Status: DC

## 2013-07-28 MED ORDER — FENOFIBRATE 160 MG PO TABS: 160.0000 mg | ORAL_TABLET | Freq: Every day | ORAL | Status: DC

## 2013-07-28 MED ORDER — TRAMADOL HCL 50 MG PO TABS: 50.0000 mg | ORAL_TABLET | Freq: Four times a day (QID) | ORAL | Status: DC | PRN

## 2013-07-28 NOTE — Patient Instructions (Signed)
Great to see you again!  I discontinued hydrochlorothiazide, started chlorthalidone  I started lyrica for pain

## 2013-07-28 NOTE — Assessment & Plan Note (Signed)
Control significantly improved with the addition of HCTZ Patient would like to stop HCTZ given her new rash Starting chlorthalidone 25 mg daily Continue amlodipine 10 mg  Her feel that her rash may very well be a drug reaction, however it's very difficult to tell which drug is causing her rash. It seems unlikely that it's due to gabapentin, HCTZ, and beta blockers.

## 2013-07-28 NOTE — Assessment & Plan Note (Signed)
Drug reaction versus asteatotic eczema per biopsy Given that symptoms have been associated with this rash so far we'll also treat it as eczema Advised Vaseline, and reduction of the number of showers/baths she's taking. Encouraged short cold showers Vaseline to areas of concern, 1% hydrocortisone for face, axilla, and Groin if necessary but advised using sparingly Triamcinolone 0.1% ointment for non-intertriginous areas of concern

## 2013-07-28 NOTE — Progress Notes (Signed)
Patient ID: [name] [name], female   DOB: 1941-06-09, 73 y.o.   MRN: [id number]  Kenn File, MD Phone: [phone]  Subjective:  Chief complaint-noted  # follow up rash and HTN  HTN Recorded blood pressures have much improved now ranging 105-133/70-84 Notes mild dyspnea  Denies chest pain and lower extremity edema He notes very good compliance with her medications  Rash States they came back shortly after starting hydrochlorothiazide. She now like to stop that medication. She previously felt that the rash was due to beta blockers. She states that the rash is very itchy and worse in her bilateral axilla, groin and bothersome on her face. It causes her to need a bath to manage her symptoms 4-5 times daily.  Chronic pain Patient states that she's had low back pain which is stable for several years. She's been out of Vicodin for one week and states that it's much worse now. She's previously seen pain management and had injections which he felt exacerbated her pain. She's tried gabapentin previously and stated that time her rash initially started and that was discontinued and felt at that time to be causing the rash.  ROS- No fever, chills,  Mild dyspnea No chest pain No abdominal pain  Past Medical History Patient Active Problem List   Diagnosis Date Noted  . Rash and nonspecific skin eruption 07/28/2013  . Pulmonary nodules 05/24/2012  . DOE (dyspnea on exertion) 05/24/2012  . PVD (peripheral vascular disease) 04/30/2012  . Carotid bruit 04/30/2012  . Dyspnea 04/30/2012  . Hypertension 04/27/2012  . Chest pain 04/27/2012  . Anxiety and depression 04/27/2012  . Hyperlipemia 04/27/2012  . Cervical spondylosis with radiculopathy 04/19/2012    Medications- reviewed and updated Current Outpatient Prescriptions  Medication Sig Dispense Refill  . amLODipine (NORVASC) 5 MG tablet Take 10 mg by mouth daily.       [name] aspirin 81 MG chewable tablet Chew 1 tablet (81 mg total) by  mouth daily.  30 tablet  0  . clopidogrel (PLAVIX) 75 MG tablet Take 75 mg by mouth daily.      . chlorthalidone (HYGROTON) 25 MG tablet Take 1 tablet (25 mg total) by mouth daily.  30 tablet  3  . cyclobenzaprine (FLEXERIL) 10 MG tablet Take 10 mg by mouth 3 (three) times daily as needed for muscle spasms.      . fenofibrate 160 MG tablet Take 1 tablet (160 mg total) by mouth daily.  90 tablet  3  . Multiple Vitamin (MULTIVITAMIN WITH MINERALS) TABS Take 1 tablet by mouth daily.      . pantoprazole (PROTONIX) 40 MG tablet Take 40 mg by mouth daily.      . predniSONE (DELTASONE) 20 MG tablet Take 20 mg by mouth daily.      . risedronate (ACTONEL) 150 MG tablet Take 150 mg by mouth every 30 (thirty) days. with water on empty stomach, nothing by mouth or lie down for next 30 minutes.      . sertraline (ZOLOFT) 100 MG tablet Take 200 mg by mouth daily.       . traMADol (ULTRAM) 50 MG tablet Take 1 tablet (50 mg total) by mouth every 6 (six) hours as needed.  120 tablet  3  . traZODone (DESYREL) 50 MG tablet Take 50 mg by mouth at bedtime. As needed      . triamcinolone ointment (KENALOG) 0.1 % Apply 1 application topically 2 (two) times daily.  30 g  0   No current  facility-administered medications for this visit.    Objective: BP 132/83  Pulse 94  Temp(Src) 98.5 F (36.9 C) (Oral)  Ht 5' (1.524 m)  Wt 145 lb (65.772 kg)  BMI 28.32 kg/m2 Gen: NAD, alert, cooperative with exam HEENT: NCAT, EOMI, PERRL CV: RRR, good S1/S2, no murmur Resp: CTABL, no wheezes, non-labored Abd: SNTND, BS present, no guarding or organomegaly Ext: No edema, warm Neuro: Alert and oriented, No gross deficits Skin: 5200 your tenderness small(2- to 6 mm) lesions some appear to be telling ectasias some appear ulcerated scattered across trunk and face.   Assessment/Plan:  Cervical spondylosis with radiculopathy Along with chronic back pain, Previously treated with chronic Vicodin I discussed the dangers of  chronic opioid use and agreed to try tramadol for now and she's not getting much relief with other medications. Also will start Lyrica as she previously had a presumed drug reaction to gabapentin Given tramadol 50 mg #120  Hypertension Control significantly improved with the addition of HCTZ Patient would like to stop HCTZ given her new rash Starting chlorthalidone 25 mg daily Continue amlodipine 10 mg  Her feel that her rash may very well be a drug reaction, however it's very difficult to tell which drug is causing her rash. It seems unlikely that it's due to gabapentin, HCTZ, and beta blockers.   Pulmonary nodules Multiple small bilateral nodules on chest CT previously She is now due for a repeat CT scan, which I ordered.  Rash and nonspecific skin eruption Drug reaction versus asteatotic eczema per biopsy Given that symptoms have been associated with this rash so far we'll also treat it as eczema Advised Vaseline, and reduction of the number of showers/baths she's taking. Encouraged short cold showers Vaseline to areas of concern, 1% hydrocortisone for face, axilla, and Groin if necessary but advised using sparingly Triamcinolone 0.1% ointment for non-intertriginous areas of concern    Orders Placed This Encounter  Procedures  . CT Chest W Contrast    Standing Status: Future     Number of Occurrences:      Standing Expiration Date: 09/26/2014    Order Specific Question:  Reason for Exam (SYMPTOM  OR DIAGNOSIS REQUIRED)    Answer:  Previous pulmonary nodules    Order Specific Question:  Preferred imaging location?    Answer:  [id number]    Meds ordered this encounter  Medications  . fenofibrate 160 MG tablet    Sig: Take 1 tablet (160 mg total) by mouth daily.    Dispense:  90 tablet    Refill:  3  . traMADol (ULTRAM) 50 MG tablet    Sig: Take 1 tablet (50 mg total) by mouth every 6 (six) hours as needed.    Dispense:  120 tablet    Refill:  3  .  chlorthalidone (HYGROTON) 25 MG tablet    Sig: Take 1 tablet (25 mg total) by mouth daily.    Dispense:  30 tablet    Refill:  3  . triamcinolone ointment (KENALOG) 0.1 %    Sig: Apply 1 application topically 2 (two) times daily.    Dispense:  30 g    Refill:  0

## 2013-07-28 NOTE — Assessment & Plan Note (Addendum)
Along with chronic back pain, Previously treated with chronic Vicodin I discussed the dangers of chronic opioid use and agreed to try tramadol for now and she's not getting much relief with other medications. Also will start Lyrica as she previously had a presumed drug reaction to gabapentin Given tramadol 50 mg #120

## 2013-07-28 NOTE — Assessment & Plan Note (Signed)
Multiple small bilateral nodules on chest CT previously She is now due for a repeat CT scan, which I ordered.

## 2013-08-01 ENCOUNTER — Ambulatory Visit (HOSPITAL_COMMUNITY)
Admission: RE | Admit: 2013-08-01 | Discharge: 2013-08-01 | Disposition: A | Discharge status: Home / Self Care | Payer: Medicare HMO | Source: Ambulatory Visit | Attending: Family Medicine | Admitting: Family Medicine

## 2013-08-01 ENCOUNTER — Encounter (HOSPITAL_COMMUNITY): Payer: Self-pay

## 2013-08-01 DIAGNOSIS — R918 Other nonspecific abnormal finding of lung field: Secondary | ICD-10-CM | POA: Insufficient documentation

## 2013-08-01 MED ORDER — IOHEXOL 300 MG/ML  SOLN
100.0000 mL | Freq: Once | INTRAMUSCULAR | Status: AC | PRN
  Administered 2013-08-01: 100 mL via INTRAVENOUS

## 2013-08-12 ENCOUNTER — Telehealth: Payer: Self-pay | Admitting: Family Medicine

## 2013-08-12 NOTE — Telephone Encounter (Signed)
Called patient to followup for her chest CT  Her pulmonary nodules have not changed in size, radiology recommends followup in October 2015.  Have been treating surgeon for cervical radiculopathy pain with tramadol and Lyrica. She states that the tramadol currently is doing nothing for her. I recommended trying 100 mg dose he'll followup with her in clinic as previously scheduled.  Laroy Apple, MD [place] Resident, PGY-2 08/12/2013, 5:31 PM

## 2013-09-01 ENCOUNTER — Ambulatory Visit
Admission: RE | Admit: 2013-09-01 | Discharge: 2013-09-01 | Disposition: A | Discharge status: Home / Self Care | Payer: Commercial Managed Care - HMO | Source: Ambulatory Visit

## 2013-09-01 DIAGNOSIS — Z1231 Encounter for screening mammogram for malignant neoplasm of breast: Secondary | ICD-10-CM

## 2013-09-04 ENCOUNTER — Ambulatory Visit: Payer: Medicare HMO | Admitting: Family Medicine

## 2013-09-11 ENCOUNTER — Encounter: Payer: Self-pay | Admitting: Family Medicine

## 2013-09-11 ENCOUNTER — Ambulatory Visit (INDEPENDENT_AMBULATORY_CARE_PROVIDER_SITE_OTHER): Payer: Commercial Managed Care - HMO | Admitting: Family Medicine

## 2013-09-11 ENCOUNTER — Ambulatory Visit
Admission: RE | Admit: 2013-09-11 | Discharge: 2013-09-11 | Disposition: A | Discharge status: Home / Self Care | Payer: Commercial Managed Care - HMO | Source: Ambulatory Visit | Attending: Family Medicine | Admitting: Family Medicine

## 2013-09-11 VITALS — BP 130/83 | HR 86 | Ht 60.0 in | Wt 146.0 lb

## 2013-09-11 DIAGNOSIS — I10 Essential (primary) hypertension: Secondary | ICD-10-CM

## 2013-09-11 DIAGNOSIS — E785 Hyperlipidemia, unspecified: Secondary | ICD-10-CM

## 2013-09-11 DIAGNOSIS — L98499 Non-pressure chronic ulcer of skin of other sites with unspecified severity: Secondary | ICD-10-CM

## 2013-09-11 DIAGNOSIS — M545 Low back pain, unspecified: Secondary | ICD-10-CM

## 2013-09-11 DIAGNOSIS — M5416 Radiculopathy, lumbar region: Secondary | ICD-10-CM | POA: Insufficient documentation

## 2013-09-11 DIAGNOSIS — M353 Polymyalgia rheumatica: Secondary | ICD-10-CM

## 2013-09-11 DIAGNOSIS — R300 Dysuria: Secondary | ICD-10-CM

## 2013-09-11 DIAGNOSIS — Z23 Encounter for immunization: Secondary | ICD-10-CM

## 2013-09-11 DIAGNOSIS — N9089 Other specified noninflammatory disorders of vulva and perineum: Secondary | ICD-10-CM

## 2013-09-11 DIAGNOSIS — R319 Hematuria, unspecified: Secondary | ICD-10-CM

## 2013-09-11 DIAGNOSIS — K219 Gastro-esophageal reflux disease without esophagitis: Secondary | ICD-10-CM | POA: Insufficient documentation

## 2013-09-11 LAB — POCT URINALYSIS DIPSTICK
Bilirubin, UA: NEGATIVE
Blood, UA: NEGATIVE
Glucose, UA: NEGATIVE
Ketones, UA: NEGATIVE
Leukocytes, UA: NEGATIVE
Nitrite, UA: NEGATIVE
Protein, UA: NEGATIVE
Spec Grav, UA: 1.01
Urobilinogen, UA: 0.2
pH, UA: 5.5

## 2013-09-11 MED ORDER — AMLODIPINE BESYLATE 5 MG PO TABS: 10.0000 mg | ORAL_TABLET | Freq: Every day | ORAL | Status: DC

## 2013-09-11 MED ORDER — PREDNISONE 5 MG PO TABS: 5.0000 mg | ORAL_TABLET | Freq: Every day | ORAL | Status: DC

## 2013-09-11 MED ORDER — CHLORTHALIDONE 25 MG PO TABS: 25.0000 mg | ORAL_TABLET | Freq: Every day | ORAL | Status: DC

## 2013-09-11 MED ORDER — PANTOPRAZOLE SODIUM 40 MG PO TBEC: 40.0000 mg | DELAYED_RELEASE_TABLET | Freq: Every day | ORAL | Status: DC

## 2013-09-11 MED ORDER — FLUCONAZOLE 150 MG PO TABS: 150.0000 mg | ORAL_TABLET | Freq: Once | ORAL | Status: DC

## 2013-09-11 NOTE — Assessment & Plan Note (Signed)
Chronic, not really helped by tramadol--> DC tramadol Discussed non use of opiates Some bony tenderness so will xray Pink flag- subjective weakness but walking normally, Not appreciated on exam and Nl Reflexes No red flags F/u 1 month, encourage heat/ice

## 2013-09-11 NOTE — Assessment & Plan Note (Signed)
No UTI, Some rash and vulvcar itching MAy be c/w her drug reaction/nummular eczema/steatotic dermatitis seen elsewhere Declines exam Will treat with diflucan X 2  As is possibly yeast

## 2013-09-11 NOTE — Assessment & Plan Note (Signed)
Pt feels its worsened by tramadol Stop tramadol Continue protonix

## 2013-09-11 NOTE — Patient Instructions (Signed)
Great to see you today!  Come back in 1 month for follow up about your back pain.   I have ordered an xray   Try to track down Dr. Jon Billings records for me.

## 2013-09-11 NOTE — Assessment & Plan Note (Signed)
Doesn't tolerate statin On fibrate

## 2013-09-11 NOTE — Assessment & Plan Note (Signed)
On her scalp with repeated bleeding for 1 year Advised seeing her dermatologist as this ma ybe squamous cell skin cancer She agrees

## 2013-09-11 NOTE — Assessment & Plan Note (Signed)
Refilled chronic steroid previously goven by [facility] rheumatologist Asked for records Patient states they help her muscle pains, Some confusion on her part that this may be fibromyalgia, if that's true then then steroids are not indicated clearly

## 2013-09-11 NOTE — Progress Notes (Signed)
Patient ID: [name] [name], female   DOB: 07/25/1940, 73 y.o.   MRN: [id number]  Kenn File, MD Phone: [phone]  Subjective:  Chief complaint-noted  # Patient here for followup of hypertension, rash, and low back  Low back pain Has been chronic previously treated with Vicodin, I prescribed tramadol which has not helped much. She also feels that the tramadol may have worsened her GERD symptoms. She notes that the back pain has gotten worse lately describes it as more sharp than usual and located in her mid low back radiating down to her coccyx area and then around her bilateral flanks. She denies bowel or bladder dysfunction, saddle anesthesia. She does have some lower extremity weakness she says was walking normally and denies any falls  Hypertension No chest pain, dyspnea, palpitations, or lower extremity edema To 3 headaches lasting one to 2 days each since last visit associated with nausea and light sensitivity, she has a history of migraines  Rash Not improved with triamcinolone, she notes burning with it, she started using Vaseline about one week ago and like to do this now.  Dysuria One week of dysuria, also notes some bloody perineal discharge but denies that it's from her vagina. She notes that she is status post hysterectomy. No fever, chills, sweats  Sore on head Notes that she has a birthmark on the top of her head that continually bleeds from being hit while she was brushing her hair or scratching at all and it. Notes recent bleeding and irritation. She would like followup with her dermatologist for   - ROS- Per HPI  Past Medical History Patient Active Problem List   Diagnosis Date Noted  . Low back pain 09/11/2013  . Polymyalgia rheumatica 09/11/2013  . GERD (gastroesophageal reflux disease) 09/11/2013  . Vulvar irritation 09/11/2013  . Non-healing ulcer 09/11/2013  . Rash and nonspecific skin eruption 07/28/2013  . Pulmonary nodules 05/24/2012  . DOE (dyspnea  on exertion) 05/24/2012  . PVD (peripheral vascular disease) 04/30/2012  . Carotid bruit 04/30/2012  . Dyspnea 04/30/2012  . Hypertension 04/27/2012  . Chest pain 04/27/2012  . Anxiety and depression 04/27/2012  . Hyperlipemia 04/27/2012  . Cervical spondylosis with radiculopathy 04/19/2012    Medications- reviewed and updated Current Outpatient Prescriptions  Medication Sig Dispense Refill  . amLODipine (NORVASC) 5 MG tablet Take 2 tablets (10 mg total) by mouth daily.  30 tablet  11  . aspirin 81 MG chewable tablet Chew 1 tablet (81 mg total) by mouth daily.  30 tablet  0  . chlorthalidone (HYGROTON) 25 MG tablet Take 1 tablet (25 mg total) by mouth daily.  30 tablet  3  . clopidogrel (PLAVIX) 75 MG tablet Take 75 mg by mouth daily.      . cyclobenzaprine (FLEXERIL) 10 MG tablet Take 10 mg by mouth 3 (three) times daily as needed for muscle spasms.      . fenofibrate 160 MG tablet Take 1 tablet (160 mg total) by mouth daily.  90 tablet  3  . fluconazole (DIFLUCAN) 150 MG tablet Take 1 tablet (150 mg total) by mouth once. Repeat dose 3 days after first dose.  2 tablet  0  . Multiple Vitamin (MULTIVITAMIN WITH MINERALS) TABS Take 1 tablet by mouth daily.      . pantoprazole (PROTONIX) 40 MG tablet Take 1 tablet (40 mg total) by mouth daily.  30 tablet  11  . predniSONE (DELTASONE) 5 MG tablet Take 1 tablet (5 mg total) by mouth  daily with breakfast.  30 tablet  2  . risedronate (ACTONEL) 150 MG tablet Take 150 mg by mouth every 30 (thirty) days. with water on empty stomach, nothing by mouth or lie down for next 30 minutes.      . sertraline (ZOLOFT) 100 MG tablet Take 200 mg by mouth daily.       . traZODone (DESYREL) 50 MG tablet Take 50 mg by mouth at bedtime. As needed      . triamcinolone ointment (KENALOG) 0.1 % Apply 1 application topically 2 (two) times daily.  30 g  0   No current facility-administered medications for this visit.    Objective: BP 130/83  Pulse 86  Ht 5'  (1.524 m)  Wt 146 lb (66.225 kg)  BMI 28.51 kg/m2 Gen: NAD, alert, cooperative with exam HEENT: NCAT, EOMI, PERRL CV: RRR, good S1/S2, no murmur Resp: CTABL, no wheezes, non-labored Abd: SNTND, BS present, no guarding or organomegaly Ext: No edema, warm Neuro: Alert and oriented, No gross deficits   Assessment/Plan:  GERD (gastroesophageal reflux disease) Pt feels its worsened by tramadol Stop tramadol Continue protonix  Hyperlipemia Doesn't tolerate statin On fibrate  Hypertension Well controlled Continue amlodipine and clorthalidone No red flags  Low back pain Chronic, not really helped by tramadol--> DC tramadol Discussed non use of opiates Some bony tenderness so will xray Pink flag- subjective weakness but walking normally, Not appreciated on exam and Nl Reflexes No red flags F/u 1 month, encourage heat/ice  Polymyalgia rheumatica Refilled chronic steroid previously goven by [facility]ologist Asked for records Patient states they help her muscle pains, Some confusion on her part that this may be fibromyalgia, if that's true then then steroids are not indicated clearly  Vulvar irritation No UTI, Some rash and vulvcar itching MAy be c/w her drug reaction/nummular eczema/steatotic dermatitis seen elsewhere Declines exam Will treat with diflucan X 2  As is possibly yeast  Non-healing ulcer On her scalp with repeated bleeding for 1 year Advised seeing her dermatologist as this ma ybe squamous cell skin cancer She agrees    Orders Placed This Encounter  Procedures  . DG Lumbar Spine Complete    Standing Status: Future     Number of Occurrences: 1     Standing Expiration Date: 11/12/2014    Order Specific Question:  Reason for Exam (SYMPTOM  OR DIAGNOSIS REQUIRED)    Answer:  Low back pain with bony tenderness    Order Specific Question:  Preferred imaging location?    Answer:  [facility]  . Pneumococcal polysaccharide vaccine 23-valent  greater than or equal to 2yo subcutaneous/IM  . POCT urinalysis dipstick    Meds ordered this encounter  Medications  . amLODipine (NORVASC) 5 MG tablet    Sig: Take 2 tablets (10 mg total) by mouth daily.    Dispense:  30 tablet    Refill:  11  . chlorthalidone (HYGROTON) 25 MG tablet    Sig: Take 1 tablet (25 mg total) by mouth daily.    Dispense:  30 tablet    Refill:  3  . predniSONE (DELTASONE) 5 MG tablet    Sig: Take 1 tablet (5 mg total) by mouth daily with breakfast.    Dispense:  30 tablet    Refill:  2  . pantoprazole (PROTONIX) 40 MG tablet    Sig: Take 1 tablet (40 mg total) by mouth daily.    Dispense:  30 tablet    Refill:  11  .  fluconazole (DIFLUCAN) 150 MG tablet    Sig: Take 1 tablet (150 mg total) by mouth once. Repeat dose 3 days after first dose.    Dispense:  2 tablet    Refill:  0

## 2013-09-11 NOTE — Assessment & Plan Note (Signed)
Well controlled Continue amlodipine and clorthalidone No red flags

## 2013-09-13 ENCOUNTER — Telehealth: Payer: Self-pay | Admitting: Family Medicine

## 2013-09-13 NOTE — Telephone Encounter (Signed)
Pt with only degenerative changes on recent lumbar back film. Will ask staff to call and encourage, ice/heat and tylenol.   Laroy Apple, MD [facility] Resident, PGY-2 09/13/2013, 1:43 AM

## 2013-09-15 NOTE — Telephone Encounter (Signed)
Called pt and informed. .[name] [name]  

## 2013-10-04 ENCOUNTER — Encounter (HOSPITAL_COMMUNITY): Payer: Self-pay | Admitting: Emergency Medicine

## 2013-10-04 ENCOUNTER — Emergency Department (HOSPITAL_COMMUNITY): Payer: Medicare HMO

## 2013-10-04 ENCOUNTER — Inpatient Hospital Stay (HOSPITAL_COMMUNITY)
Admission: EM | Admit: 2013-10-04 | Discharge: 2013-10-07 | DRG: 057 | Disposition: A | Discharge status: Home Health | Payer: Medicare HMO | Attending: Family Medicine | Admitting: Family Medicine

## 2013-10-04 DIAGNOSIS — Z7982 Long term (current) use of aspirin: Secondary | ICD-10-CM

## 2013-10-04 DIAGNOSIS — R079 Chest pain, unspecified: Secondary | ICD-10-CM

## 2013-10-04 DIAGNOSIS — I129 Hypertensive chronic kidney disease with stage 1 through stage 4 chronic kidney disease, or unspecified chronic kidney disease: Secondary | ICD-10-CM | POA: Diagnosis present

## 2013-10-04 DIAGNOSIS — R0600 Dyspnea, unspecified: Secondary | ICD-10-CM

## 2013-10-04 DIAGNOSIS — F329 Major depressive disorder, single episode, unspecified: Secondary | ICD-10-CM | POA: Diagnosis present

## 2013-10-04 DIAGNOSIS — G20 Parkinson's disease: Principal | ICD-10-CM | POA: Diagnosis present

## 2013-10-04 DIAGNOSIS — K219 Gastro-esophageal reflux disease without esophagitis: Secondary | ICD-10-CM | POA: Diagnosis present

## 2013-10-04 DIAGNOSIS — F411 Generalized anxiety disorder: Secondary | ICD-10-CM | POA: Diagnosis present

## 2013-10-04 DIAGNOSIS — I10 Essential (primary) hypertension: Secondary | ICD-10-CM

## 2013-10-04 DIAGNOSIS — Q605 Renal hypoplasia, unspecified: Secondary | ICD-10-CM

## 2013-10-04 DIAGNOSIS — F419 Anxiety disorder, unspecified: Secondary | ICD-10-CM

## 2013-10-04 DIAGNOSIS — R0989 Other specified symptoms and signs involving the circulatory and respiratory systems: Secondary | ICD-10-CM

## 2013-10-04 DIAGNOSIS — F3289 Other specified depressive episodes: Secondary | ICD-10-CM | POA: Diagnosis present

## 2013-10-04 DIAGNOSIS — M353 Polymyalgia rheumatica: Secondary | ICD-10-CM

## 2013-10-04 DIAGNOSIS — Q602 Renal agenesis, unspecified: Secondary | ICD-10-CM

## 2013-10-04 DIAGNOSIS — R279 Unspecified lack of coordination: Secondary | ICD-10-CM

## 2013-10-04 DIAGNOSIS — N183 Chronic kidney disease, stage 3 unspecified: Secondary | ICD-10-CM | POA: Diagnosis present

## 2013-10-04 DIAGNOSIS — R278 Other lack of coordination: Secondary | ICD-10-CM | POA: Diagnosis present

## 2013-10-04 DIAGNOSIS — E785 Hyperlipidemia, unspecified: Secondary | ICD-10-CM | POA: Diagnosis present

## 2013-10-04 DIAGNOSIS — Z981 Arthrodesis status: Secondary | ICD-10-CM

## 2013-10-04 DIAGNOSIS — H8309 Labyrinthitis, unspecified ear: Secondary | ICD-10-CM | POA: Diagnosis present

## 2013-10-04 DIAGNOSIS — I739 Peripheral vascular disease, unspecified: Secondary | ICD-10-CM | POA: Diagnosis present

## 2013-10-04 DIAGNOSIS — I639 Cerebral infarction, unspecified: Secondary | ICD-10-CM

## 2013-10-04 DIAGNOSIS — IMO0001 Reserved for inherently not codable concepts without codable children: Secondary | ICD-10-CM | POA: Diagnosis present

## 2013-10-04 DIAGNOSIS — Z7902 Long term (current) use of antithrombotics/antiplatelets: Secondary | ICD-10-CM

## 2013-10-04 DIAGNOSIS — R531 Weakness: Secondary | ICD-10-CM

## 2013-10-04 DIAGNOSIS — Z87891 Personal history of nicotine dependence: Secondary | ICD-10-CM

## 2013-10-04 DIAGNOSIS — R0609 Other forms of dyspnea: Secondary | ICD-10-CM

## 2013-10-04 DIAGNOSIS — R42 Dizziness and giddiness: Secondary | ICD-10-CM

## 2013-10-04 DIAGNOSIS — Z79899 Other long term (current) drug therapy: Secondary | ICD-10-CM

## 2013-10-04 DIAGNOSIS — F32A Depression, unspecified: Secondary | ICD-10-CM

## 2013-10-04 DIAGNOSIS — G20A1 Parkinson's disease without dyskinesia, without mention of fluctuations: Principal | ICD-10-CM | POA: Diagnosis present

## 2013-10-04 DIAGNOSIS — IMO0002 Reserved for concepts with insufficient information to code with codable children: Secondary | ICD-10-CM

## 2013-10-04 LAB — URINALYSIS, ROUTINE W REFLEX MICROSCOPIC
Bilirubin Urine: NEGATIVE
Glucose, UA: NEGATIVE mg/dL
Hgb urine dipstick: NEGATIVE
Ketones, ur: NEGATIVE mg/dL
Nitrite: NEGATIVE
Protein, ur: NEGATIVE mg/dL
Specific Gravity, Urine: 1.003 — ABNORMAL LOW (ref 1.005–1.030)
Urobilinogen, UA: 0.2 mg/dL (ref 0.0–1.0)
pH: 6 (ref 5.0–8.0)

## 2013-10-04 LAB — CBC
HCT: 36.9 % (ref 36.0–46.0)
Hemoglobin: 13.2 g/dL (ref 12.0–15.0)
MCH: 34.2 pg — ABNORMAL HIGH (ref 26.0–34.0)
MCHC: 35.8 g/dL (ref 30.0–36.0)
MCV: 95.6 fL (ref 78.0–100.0)
Platelets: 205 10*3/uL (ref 150–400)
RBC: 3.86 MIL/uL — ABNORMAL LOW (ref 3.87–5.11)
RDW: 13 % (ref 11.5–15.5)
WBC: 9 10*3/uL (ref 4.0–10.5)

## 2013-10-04 LAB — COMPREHENSIVE METABOLIC PANEL
ALT: 12 U/L (ref 0–35)
AST: 21 U/L (ref 0–37)
Albumin: 4 g/dL (ref 3.5–5.2)
Alkaline Phosphatase: 53 U/L (ref 39–117)
BUN: 24 mg/dL — ABNORMAL HIGH (ref 6–23)
CO2: 28 mEq/L (ref 19–32)
Calcium: 10 mg/dL (ref 8.4–10.5)
Chloride: 97 mEq/L (ref 96–112)
Creatinine, Ser: 1.57 mg/dL — ABNORMAL HIGH (ref 0.50–1.10)
GFR calc Af Amer: 36 mL/min — ABNORMAL LOW (ref >90)
GFR calc non Af Amer: 31 mL/min — ABNORMAL LOW (ref >90)
Glucose, Bld: 84 mg/dL (ref 70–99)
Potassium: 3.7 mEq/L (ref 3.7–5.3)
Sodium: 139 mEq/L (ref 137–147)
Total Bilirubin: <0.2 mg/dL — ABNORMAL LOW (ref 0.3–1.2)
Total Protein: 7 g/dL (ref 6.0–8.3)

## 2013-10-04 LAB — I-STAT TROPONIN, ED: Troponin i, poc: 0.01 ng/mL (ref 0.00–0.08)

## 2013-10-04 LAB — URINE MICROSCOPIC-ADD ON

## 2013-10-04 LAB — DIFFERENTIAL
Basophils Absolute: 0 10*3/uL (ref 0.0–0.1)
Basophils Relative: 0 % (ref 0–1)
Eosinophils Absolute: 0 10*3/uL (ref 0.0–0.7)
Eosinophils Relative: 0 % (ref 0–5)
Lymphocytes Relative: 21 % (ref 12–46)
Lymphs Abs: 1.9 10*3/uL (ref 0.7–4.0)
Monocytes Absolute: 0.6 10*3/uL (ref 0.1–1.0)
Monocytes Relative: 6 % (ref 3–12)
Neutro Abs: 6.5 10*3/uL (ref 1.7–7.7)
Neutrophils Relative %: 72 % (ref 43–77)

## 2013-10-04 LAB — PROTIME-INR
INR: 1 (ref 0.00–1.49)
Prothrombin Time: 13 seconds (ref 11.6–15.2)

## 2013-10-04 LAB — APTT: aPTT: 32 seconds (ref 24–37)

## 2013-10-04 NOTE — ED Notes (Signed)
Report called to [facility], [facility] requested for transport, pts family notified of transfer.  Per Dispatch truck should be here within the hour.

## 2013-10-04 NOTE — H&P (Signed)
[facility] Admission History and Physical Service Pager: [phone]  Patient name: [name] [name] record number: [id number] Date of birth: [birth year] Age: 73 y.o. Gender: female  Primary Care Provider: Kenn File, MD Consultants: none Code Status: Full  Chief Complaint: fatigue  Assessment and Plan: [name] [name] is a 73 y.o. female presenting with fatigue and dizziness x 1 week. PMH is significant for HTN, PVD (s/p right leg stent), HLD, PMR, GERD, anxiety/depression  # Concern for cerebellar stroke: patient with complaints for past week, would be outside tpa window. Patient currently taking ASA and plavix daily. Labs unremarkable, CT head with no acute abnormalities, chronic microvascular ischemic changes. EKG is NSR. Findings on exam with dysmetria, decreased right sensation of face, and rhomberg positive. Complains of dizziness on sitting up but no definite nystagmus noted. Exam somewhat concerning for stroke, but findings are also somewhat nonspecific and not very well localized. May also be related to severe physical deconditioning, early parkinsonism, medication induced, (pt w/ forgetfulness, neuromuscular disorder, and mental disorder on her problem list) or heart failure given weakness. - admit to tele, attending Dr. Andria Frames - ASA 53m, plavix 753m- allow permissive HTN, hold home BP meds - MRI/MRA head - carotid dopplers, 2d echo - Risk strat: A1c, lipid in 06/2013 with (Chol 236, TG 112, HDL 76, LDL 138) - PT/OT and vestibular evals - RN bedside swallow, if fails again SLP eval - Hold flexeril and trazodone for now  # AoCKD: PT w/ congenital abscence of L kidney, and w/ h/o CKD. Cr elevated to 1.57 on admission. Difficult to tell baseline but trending up over past 2 years. BP at goal. No h/o DM.  - Avoid renally toxic medications - IVF as below.   # Polymyalgia Rheumatica: suspect that fatigue may be from PMR flare. Takes 3m11mrednisone  daily. - check ESR with AM labs - continue 3mg2mednisone, consider increasing for potential flare  # GERD: not currently on PPI, symptomatic recently - will start protonix if passes swallow eval  # HLD: cannot tolerate statins, on fenofibrate - continue once passed swallow eval  # HTN: Normotensive to low BP overall. on amlodipine 10mg3mlorhtalidone 23mg 52mlding  # Anxiety/depression: - continue zoloft  FEN/GI: 1/2NS at 75cc/hr, NPO pending swallow eval Prophylaxis: heparin  Disposition: admit to tele  History of Present Illness: [name] LMARIADEL [id number] y.o34female presenting with worsening fatigue and dizziness x 1 week. She has felt weak/tired for several months but worsened in past week; no specific part of body weaker than other. Daughter has noticed that she has been more unsteady the past week, falling a few times but did not injure herself or hit her head. She has not had any difficulty with speech. Daughter and patient noticed her right eyelid lower than normal causing her to not see well out of the right eye (can't say when this was first noticed). She says that she gets dizzy, and when she sits up her vision looks "wavey" and a little more blurry. She denies CP, SOB, fevers/chills, has been nauseas with some vomiting (NBNB), diarrhea worsened over past week. Endorses heartburn, difficulty swallowing (sensation of food getting stuck), left hearing loss x 5 years though completely gone sometime over past year. She also describes left sided headache pain that radiates from behind eye to back of head; feels like a migraine that she has had history of, does not currently have this pain.  Review Of Systems:  Per HPI with the following additions: none Otherwise 12 point review of systems was performed and was unremarkable.  Patient Active Problem List   Diagnosis Date Noted  . Dysmetria 10/04/2013  . Low back pain 09/11/2013  . Polymyalgia rheumatica 09/11/2013  . GERD  (gastroesophageal reflux disease) 09/11/2013  . Vulvar irritation 09/11/2013  . Non-healing ulcer 09/11/2013  . Rash and nonspecific skin eruption 07/28/2013  . Pulmonary nodules 05/24/2012  . DOE (dyspnea on exertion) 05/24/2012  . PVD (peripheral vascular disease) 04/30/2012  . Carotid bruit 04/30/2012  . Dyspnea 04/30/2012  . Hypertension 04/27/2012  . Chest pain 04/27/2012  . Anxiety and depression 04/27/2012  . Hyperlipemia 04/27/2012  . Cervical spondylosis with radiculopathy 04/19/2012   Past Medical History: Past Medical History  Diagnosis Date  . Hypertension   . Heart murmur   . Pneumonia   . Forgetfulness   . Anxiety   . Mental disorder   . Depression   . Psoriasis   . Arthritis   . Hypercholesteremia   . Congenital absence of one kidney     Pt has right kidney only  . Neuromuscular disorder   . Carpal tunnel syndrome, bilateral   . Chronic kidney disease     CKD stage III, absence of left kidney  . Peripheral vascular disease     Dr. Benjamine Sprague; right external iliac stent '06 with angioplasty '13  . Polymyalgia rheumatica    Past Surgical History: Past Surgical History  Procedure Laterality Date  . Abdominal hysterectomy      Partial   . Appendectomy    . Tonsillectomy    . Eye surgery    . Iliac artery stent    . Anterior cervical decomp/discectomy fusion  04/19/2012    Procedure: ANTERIOR CERVICAL DECOMPRESSION/DISCECTOMY FUSION 2 LEVELS;  Surgeon: Winfield Cunas, MD;  Location: [facility];  Service: Neurosurgery;  Laterality: N/A;  Cervical four-five,Cervical five-six anterior cervical decompression with fusion plating and bonegraft possible posterior cervical decompression   Social History: History  Substance Use Topics  . Smoking status: Former Smoker -- 1.00 packs/day for 40 years    Types: Cigarettes    Quit date: 07/24/2010  . Smokeless tobacco: Never Used     Comment: STARTED BACK JULY 2013 AND RECENTLY QUIT 03-10-2012  . Alcohol Use: No      Comment: QUIT IN 1990    Additional social history: lives with daughter at home, does not cook anymore  Please also refer to relevant sections of EMR.  Family History: No family history on file. Allergies and Medications: Allergies  Allergen Reactions  . Gabapentin Itching  . Other Other (See Comments)    Cholesterol medications cause muscle pain  . Statins     Swelling involving tongue, rash   No current facility-administered medications on file prior to encounter.   Current Outpatient Prescriptions on File Prior to Encounter  Medication Sig Dispense Refill  . amLODipine (NORVASC) 5 MG tablet Take 2 tablets (10 mg total) by mouth daily.  30 tablet  11  . chlorthalidone (HYGROTON) 25 MG tablet Take 1 tablet (25 mg total) by mouth daily.  30 tablet  3  . clopidogrel (PLAVIX) 75 MG tablet Take 75 mg by mouth every morning.       . cyclobenzaprine (FLEXERIL) 10 MG tablet Take 10 mg by mouth 3 (three) times daily as needed for muscle spasms.      . fenofibrate 160 MG tablet Take 1 tablet (160 mg total) by mouth  daily.  90 tablet  3  . predniSONE (DELTASONE) 5 MG tablet Take 1 tablet (5 mg total) by mouth daily with breakfast.  30 tablet  2  . risedronate (ACTONEL) 150 MG tablet Take 150 mg by mouth every 30 (thirty) days. with water on empty stomach, nothing by mouth or lie down for next 30 minutes.      . sertraline (ZOLOFT) 100 MG tablet Take 150 mg by mouth every morning.       . traZODone (DESYREL) 50 MG tablet Take 50 mg by mouth at bedtime.         Objective: BP 136/68  Pulse 81  Temp(Src) 98.5 F (36.9 C) (Oral)  Resp 14  Ht 5' (1.524 m)  Wt 145 lb (65.772 kg)  BMI 28.32 kg/m2  SpO2 97% Exam: General: NAD HEENT: Pupils dilated but equal, reactive to light. EOMI, some dysconjugate movements noted. MMM. Cardiovascular: RRR, normal s1/s2, no murmur appreciated. 2/6 carotid bruit on right. Respiratory: CTAB, normal effort Abdomen: soft, mildly tender on left side, no  rebound or guarding. Bowel sounds present Extremities: no edema/cyanosis Skin: diffuse age-related atrophy, no erythematous rashes Neuro: alert and oriented. CN below. No pronator drift, babinski equivocal on left. Reflexes 1+ patella bilaterally, biceps 3+ on left, 2+ on right. Strength symmetric: 4/5 arm flexion/extension, 5/5 grip, 4+/5 ankle flexion. Dysmetria on finger to nose test bilaterally. Slow alternating hand movements bilaterally. Heel-to-shin appears normal. Rhomberg Positive and gait very unstable Cranial Nerves:  II: Pupils equal, round, reactive to light  III,IV, VI: mild right ptosis, extra-ocular motions intact bilaterally. Some dysconjugate movements noted  V,VII: smile symmetric, facial light touch sensation decreased on right forehead, almost absent over right cheek and jaw  VIII: hearing normal on right, absent on left (chronic) IX,X: symmetric palate elevation XI: bilateral shoulder shrug  XII: midline tongue extension without atrophy or fasciculations    Labs and Imaging: CBC BMET   Recent Labs Lab 10/04/13 1935  WBC 9.0  HGB 13.2  HCT 36.9  PLT 205    Recent Labs Lab 10/04/13 1935  NA 139  K 3.7  CL 97  CO2 28  BUN 24*  CREATININE 1.57*  GLUCOSE 84  CALCIUM 10.0     Dg Lumbar Spine Complete  09/11/2013   CLINICAL DATA:  Low back and bilateral leg pain, no injury  EXAM: LUMBAR SPINE - COMPLETE 4+ VIEW  COMPARISON:  None.  FINDINGS: There is a mild curvature of the lumbar spine. There are 6 non-rib-bearing lumbar vertebrae present. There is degenerative disc disease primarily at the L5-L6 level and at L2-3. No compression deformity is noted. There is degenerative change diffusely throughout the facet joints particularly of L2-3, L4-5, and L5-L6.   IMPRESSION: Degenerative change primarily at L2-3 and L5-L6. No compression deformity.   Electronically Signed   By: Ivar Drape M.D.   On: 09/11/2013 16:23   Ct Head Wo Contrast  10/04/2013   CLINICAL  DATA:  Dizziness and fatigue  EXAM: CT HEAD WITHOUT CONTRAST  TECHNIQUE: Contiguous axial images were obtained from the base of the skull through the vertex without intravenous contrast.  COMPARISON:  10/16/2012  FINDINGS: Ventricles are normal in size, for this patient's age, and normal in configuration. There are no parenchymal masses or mass effect. There is minor periventricular white matter hypoattenuation most consistent with chronic microvascular ischemic change there is no evidence of an infarct.  There are no extra-axial masses or abnormal fluid collections.  There is no  intracranial hemorrhage.  Visualized sinuses and mastoid air cells are clear. No skull lesion.   IMPRESSION: 1. No acute intracranial abnormalities. 2. Age related volume loss. Minor chronic microvascular ischemic change.   Electronically Signed   By: Lajean Manes M.D.   On: 10/04/2013 19:53    Tawanna Sat, MD 10/04/2013, 11:20 PM PGY-1, [facility] Intern pager: [phone], text pages welcome   ----------------------------------------------------------------------------------------------------  Upper Level Addendum  Pt seen and examined and agree w/ Dr. Marissa Calamity H&P w/ changes noted in [place], MD Family Medicine PGY-3 10/05/2013, 6:44 AM

## 2013-10-04 NOTE — ED Notes (Signed)
Pt presents with c/o dizziness and fatigue. Pt says that she is very unsteady on her feet and dizzy and those symptoms started today. Pt also said that she has been feeling very tired for the past few weeks and all she wants to do is sleep. Pt reports that she has not been eating very well.

## 2013-10-04 NOTE — ED Notes (Signed)
Report called to [facility], ETA 10 minute

## 2013-10-04 NOTE — ED Provider Notes (Signed)
CSN: [id number]     Arrival date & time 10/04/13  1809 History   First MD Initiated Contact with Patient 10/04/13 1844     Chief Complaint  Patient presents with  . Dizziness  . Fatigue    HPI  Pt has been having trouble with feeling fatigued for at least the last few weeks but it has been getting worse.  She has been seeing her doctor, Dr Wendi Snipes.   Today she has felt very unsteady.  When she tries to walk she feels like she has been spinning.  Today the symptoms are worse than she has had before. No falls today.  No trouble with speech.  She does not notice a difference in her strength between her left and right.  No vomiting.  Occsnl some loose stools.  No chest pain or abdominal pain.   Past Medical History  Diagnosis Date  . Hypertension   . Heart murmur   . Pneumonia   . Forgetfulness   . Anxiety   . Mental disorder   . Depression   . Psoriasis   . Arthritis   . Hypercholesteremia   . Congenital absence of one kidney     Pt has right kidney only  . Neuromuscular disorder   . Carpal tunnel syndrome, bilateral   . Chronic kidney disease     CKD stage III, absence of left kidney  . Peripheral vascular disease     Dr. Benjamine Sprague; right external iliac stent '06 with angioplasty '13  . Polymyalgia rheumatica    Past Surgical History  Procedure Laterality Date  . Abdominal hysterectomy      Partial   . Appendectomy    . Tonsillectomy    . Eye surgery    . Iliac artery stent    . Anterior cervical decomp/discectomy fusion  04/19/2012    Procedure: ANTERIOR CERVICAL DECOMPRESSION/DISCECTOMY FUSION 2 LEVELS;  Surgeon: Winfield Cunas, MD;  Location: [facility];  Service: Neurosurgery;  Laterality: N/A;  Cervical four-five,Cervical five-six anterior cervical decompression with fusion plating and bonegraft possible posterior cervical decompression   No family history on file. History  Substance Use Topics  . Smoking status: Former Smoker -- 1.00 packs/day for 40 years    Types:  Cigarettes    Quit date: 07/24/2010  . Smokeless tobacco: Never Used     Comment: STARTED BACK JULY 2013 AND RECENTLY QUIT 03-10-2012  . Alcohol Use: No     Comment: QUIT IN 1990    OB History   Grav Para Term Preterm Abortions TAB SAB Ect Mult Living                 Review of Systems  All other systems reviewed and are negative.      Allergies  Gabapentin; Other; and Statins  Home Medications   Current Outpatient Rx  Name  Route  Sig  Dispense  Refill  . amLODipine (NORVASC) 5 MG tablet   Oral   Take 2 tablets (10 mg total) by mouth daily.   30 tablet   11   . aspirin 81 MG chewable tablet   Oral   Chew 1 tablet (81 mg total) by mouth daily.   30 tablet   0   . chlorthalidone (HYGROTON) 25 MG tablet   Oral   Take 1 tablet (25 mg total) by mouth daily.   30 tablet   3   . clopidogrel (PLAVIX) 75 MG tablet   Oral   Take 75 mg  by mouth daily.         . cyclobenzaprine (FLEXERIL) 10 MG tablet   Oral   Take 10 mg by mouth 3 (three) times daily as needed for muscle spasms.         . fenofibrate 160 MG tablet   Oral   Take 1 tablet (160 mg total) by mouth daily.   90 tablet   3   . fluconazole (DIFLUCAN) 150 MG tablet   Oral   Take 1 tablet (150 mg total) by mouth once. Repeat dose 3 days after first dose.   2 tablet   0   . Multiple Vitamin (MULTIVITAMIN WITH MINERALS) TABS   Oral   Take 1 tablet by mouth daily.         . pantoprazole (PROTONIX) 40 MG tablet   Oral   Take 1 tablet (40 mg total) by mouth daily.   30 tablet   11   . predniSONE (DELTASONE) 5 MG tablet   Oral   Take 1 tablet (5 mg total) by mouth daily with breakfast.   30 tablet   2   . risedronate (ACTONEL) 150 MG tablet   Oral   Take 150 mg by mouth every 30 (thirty) days. with water on empty stomach, nothing by mouth or lie down for next 30 minutes.         . sertraline (ZOLOFT) 100 MG tablet   Oral   Take 200 mg by mouth daily.          . traZODone (DESYREL)  50 MG tablet   Oral   Take 50 mg by mouth at bedtime. As needed         . triamcinolone ointment (KENALOG) 0.1 %   Topical   Apply 1 application topically 2 (two) times daily.   30 g   0    BP 131/76  Pulse 103  Temp(Src) 98.3 F (36.8 C) (Oral)  Resp 16  Ht 5' (1.524 m)  Wt 145 lb (65.772 kg)  BMI 28.32 kg/m2  SpO2 97% Physical Exam  Nursing note and vitals reviewed. Constitutional: She is oriented to person, place, and time. No distress.  HENT:  Head: Normocephalic and atraumatic.  Right Ear: External ear normal.  Left Ear: External ear normal.  Mouth/Throat: Oropharynx is clear and moist.  Eyes: Conjunctivae are normal. Right eye exhibits no discharge. Left eye exhibits no discharge. No scleral icterus.  Neck: Neck supple. No tracheal deviation present.  Cardiovascular: Normal rate, regular rhythm and intact distal pulses.   Pulmonary/Chest: Effort normal and breath sounds normal. No stridor. No respiratory distress. She has no wheezes. She has no rales.  Abdominal: Soft. Bowel sounds are normal. She exhibits no distension. There is no tenderness. There is no rebound and no guarding.  Musculoskeletal: She exhibits no edema and no tenderness.  Neurological: She is alert and oriented to person, place, and time. She has normal strength. She is not disoriented. She displays tremor. No cranial nerve deficit (No facial droop, extraocular movements intact, tongue midline ) or sensory deficit. She exhibits normal muscle tone. She displays no seizure activity. Coordination abnormal.  No pronator drift bilateral upper extrem, equal grip strength, difficulty with lifting legs off the bed but patients states this is related to chronic back pain, equal plantar flexion strength,  sensation intact in all extremities, no visual field cuts, no left or right sided neglect, pass pointing with  finger-nose exam , intention tremor, no nystagmus noted  Skin: Skin is warm and dry. No rash noted.   Psychiatric: She has a normal mood and affect.    ED Course  Procedures (including critical care time) Labs Review Labs Reviewed  CBC - Abnormal; Notable for the following:    RBC 3.86 (*)    MCH 34.2 (*)    All other components within normal limits  COMPREHENSIVE METABOLIC PANEL - Abnormal; Notable for the following:    BUN 24 (*)    Creatinine, Ser 1.57 (*)    Total Bilirubin <0.2 (*)    GFR calc non Af Amer 31 (*)    GFR calc Af Amer 36 (*)    All other components within normal limits  URINALYSIS, ROUTINE W REFLEX MICROSCOPIC - Abnormal; Notable for the following:    Specific Gravity, Urine 1.003 (*)    Leukocytes, UA SMALL (*)    All other components within normal limits  URINE MICROSCOPIC-ADD ON - Abnormal; Notable for the following:    Bacteria, UA FEW (*)    All other components within normal limits  PROTIME-INR  APTT  DIFFERENTIAL  I-STAT TROPOININ, ED   Imaging Review Ct Head Wo Contrast  10/04/2013   CLINICAL DATA:  Dizziness and fatigue  EXAM: CT HEAD WITHOUT CONTRAST  TECHNIQUE: Contiguous axial images were obtained from the base of the skull through the vertex without intravenous contrast.  COMPARISON:  10/16/2012  FINDINGS: Ventricles are normal in size, for this patient's age, and normal in configuration. There are no parenchymal masses or mass effect. There is minor periventricular white matter hypoattenuation most consistent with chronic microvascular ischemic change there is no evidence of an infarct.  There are no extra-axial masses or abnormal fluid collections.  There is no intracranial hemorrhage.  Visualized sinuses and mastoid air cells are clear. No skull lesion.  IMPRESSION: 1. No acute intracranial abnormalities. 2. Age related volume loss. Minor chronic microvascular ischemic change.   Electronically Signed   By: Lajean Manes M.D.   On: 10/04/2013 19:53   EKG interpretation in [facility]  MDM   Final diagnoses:  Weakness  Dysmetria    Pt has  dysmetria on exam complaining of weakness and dizziness.  Features concerning for the possibility of cerebellar/posterior circulation ischemia.  Will consult with FP service for admission, further evaluation.    Kathalene Frames, MD 10/04/13 [phone]

## 2013-10-05 ENCOUNTER — Encounter (HOSPITAL_COMMUNITY): Payer: Self-pay | Admitting: Family Medicine

## 2013-10-05 DIAGNOSIS — F341 Dysthymic disorder: Secondary | ICD-10-CM

## 2013-10-05 DIAGNOSIS — I519 Heart disease, unspecified: Secondary | ICD-10-CM

## 2013-10-05 DIAGNOSIS — I10 Essential (primary) hypertension: Secondary | ICD-10-CM

## 2013-10-05 DIAGNOSIS — R42 Dizziness and giddiness: Secondary | ICD-10-CM | POA: Diagnosis present

## 2013-10-05 LAB — SEDIMENTATION RATE: Sed Rate: 9 mm/hr (ref 0–22)

## 2013-10-05 LAB — HEMOGLOBIN A1C
Hgb A1c MFr Bld: 5 % (ref <5.7)
Mean Plasma Glucose: 97 mg/dL (ref <117)

## 2013-10-05 LAB — TSH: TSH: 2.542 u[IU]/mL (ref 0.350–4.500)

## 2013-10-05 MED ORDER — SODIUM CHLORIDE 0.9 % IJ SOLN: 3.0000 mL | INTRAMUSCULAR | Status: DC | PRN

## 2013-10-05 MED ORDER — TRAMADOL HCL 50 MG PO TABS
50.0000 mg | ORAL_TABLET | Freq: Four times a day (QID) | ORAL | Status: DC | PRN
  Administered 2013-10-05 – 2013-10-06 (×3): 50 mg via ORAL
  Filled 2013-10-05 (×3): qty 1

## 2013-10-05 MED ORDER — SODIUM CHLORIDE 0.9 % IV SOLN: 250.0000 mL | INTRAVENOUS | Status: DC | PRN

## 2013-10-05 MED ORDER — ACETAMINOPHEN 650 MG RE SUPP: 650.0000 mg | RECTAL | Status: DC | PRN

## 2013-10-05 MED ORDER — HEPARIN SODIUM (PORCINE) 5000 UNIT/ML IJ SOLN
5000.0000 [IU] | Freq: Three times a day (TID) | INTRAMUSCULAR | Status: DC
  Administered 2013-10-05 – 2013-10-06 (×6): 5000 [IU] via SUBCUTANEOUS
  Filled 2013-10-05 (×10): qty 1

## 2013-10-05 MED ORDER — SODIUM CHLORIDE 0.45 % IV SOLN
INTRAVENOUS | Status: DC
  Administered 2013-10-05 (×2): 75 mL/h via INTRAVENOUS
  Administered 2013-10-05 – 2013-10-07 (×3): via INTRAVENOUS

## 2013-10-05 MED ORDER — SERTRALINE HCL 50 MG PO TABS
150.0000 mg | ORAL_TABLET | Freq: Every morning | ORAL | Status: DC
  Administered 2013-10-05 – 2013-10-07 (×3): 150 mg via ORAL
  Filled 2013-10-05 (×3): qty 1

## 2013-10-05 MED ORDER — PREDNISONE 5 MG PO TABS
5.0000 mg | ORAL_TABLET | Freq: Once | ORAL | Status: AC
  Administered 2013-10-05: 5 mg via ORAL
  Filled 2013-10-05: qty 1

## 2013-10-05 MED ORDER — CYCLOBENZAPRINE HCL 10 MG PO TABS
10.0000 mg | ORAL_TABLET | Freq: Three times a day (TID) | ORAL | Status: DC | PRN
Start: 2013-10-05 — End: 2013-10-05
  Filled 2013-10-05: qty 1

## 2013-10-05 MED ORDER — ASPIRIN EC 81 MG PO TBEC
81.0000 mg | DELAYED_RELEASE_TABLET | Freq: Every morning | ORAL | Status: DC
  Administered 2013-10-05 – 2013-10-07 (×3): 81 mg via ORAL
  Filled 2013-10-05 (×3): qty 1

## 2013-10-05 MED ORDER — ACETAMINOPHEN 325 MG PO TABS
650.0000 mg | ORAL_TABLET | ORAL | Status: DC | PRN
  Administered 2013-10-05: 650 mg via ORAL
  Filled 2013-10-05: qty 2

## 2013-10-05 MED ORDER — CLOPIDOGREL BISULFATE 75 MG PO TABS
75.0000 mg | ORAL_TABLET | Freq: Every day | ORAL | Status: DC
  Administered 2013-10-05 – 2013-10-07 (×3): 75 mg via ORAL
  Filled 2013-10-05 (×3): qty 1

## 2013-10-05 MED ORDER — FENOFIBRATE 160 MG PO TABS
160.0000 mg | ORAL_TABLET | Freq: Every day | ORAL | Status: DC
  Administered 2013-10-05 – 2013-10-07 (×3): 160 mg via ORAL
  Filled 2013-10-05 (×3): qty 1

## 2013-10-05 MED ORDER — PREDNISONE 10 MG PO TABS
10.0000 mg | ORAL_TABLET | Freq: Every day | ORAL | Status: DC
  Administered 2013-10-06 – 2013-10-07 (×2): 10 mg via ORAL
  Filled 2013-10-05 (×3): qty 1

## 2013-10-05 MED ORDER — TRAZODONE HCL 50 MG PO TABS: 50.0000 mg | ORAL_TABLET | Freq: Every day | ORAL | Status: DC

## 2013-10-05 MED ORDER — SENNOSIDES-DOCUSATE SODIUM 8.6-50 MG PO TABS
1.0000 | ORAL_TABLET | Freq: Every evening | ORAL | Status: DC | PRN
  Filled 2013-10-05: qty 1

## 2013-10-05 MED ORDER — SODIUM CHLORIDE 0.9 % IJ SOLN
3.0000 mL | Freq: Two times a day (BID) | INTRAMUSCULAR | Status: DC
  Administered 2013-10-06: 3 mL via INTRAVENOUS

## 2013-10-05 MED ORDER — PREDNISONE 5 MG PO TABS
5.0000 mg | ORAL_TABLET | Freq: Every day | ORAL | Status: DC
  Administered 2013-10-05: 5 mg via ORAL
  Filled 2013-10-05 (×2): qty 1

## 2013-10-05 NOTE — H&P (Signed)
Seen and examined.  Discussed with Dr. Marily Memos.  Agree with his documentation and management.  Briefly, 73 yo female with confusing story.  As best I can tell, she has a host of chronic medical problems.  She became mildly worse ~ 1 week ago with worsening unsteadiness which PT eval says is consistent with vertigo. Yesterday, she was MUCH worse, some unsteadiness and some weakness.  It seems as if she has at least subacute vertigo (BPPV, labyrinthitis, brainstem/cerebellar problem).  The question in my mind is does she have something else in addition that caused her acute decline yesterday.  We will work up with stroke protocol.  Will get neuro to see.  Thanks for great PT help - she is obviously unsafe and will need rehab CRIF recommended. Will follow.

## 2013-10-05 NOTE — Progress Notes (Signed)
Seen and examined.  Agree with Dr. Marily Memos.  See my note co sign of the H&PE for my note of today.

## 2013-10-05 NOTE — Progress Notes (Signed)
Echocardiogram 2D Echocardiogram has been performed.  Joelene Millin 10/05/2013, 11:26 AM

## 2013-10-05 NOTE — Consult Note (Addendum)
NEURO HOSPITALIST CONSULT NOTE    Reason for Consult: concern for cerebellar stroke versus early PD.  HPI:                                                                                                                                          [name] [name] is an 73 y.o. female with a past medical history significant for HTN, hyperlipidemia, PVD s/p right leg stent, anxiety, congenital abscence of L kidney, CKD, depression, PMR, and GERD, admitted to [facility] due to worsening fatigue and dizziness for the last week. She has been noted to have some abnormalities on neuro-exam and thus a neurological consultation was requested. [name] [name] relates that she has been feeling fatigue for few weeks and dizzy for quite some time, but is now concerned because for the last week " everything had gotten worse". She feels tired all the time, no much energy to do things and describes the dizziness as " lightheadedness, a sensation of flipping with any type of movements, mainly getting up or turning in bed". At times can get clammy and sweaty and the lightheadedness is daily, off and on. At the same time, she complains of decreased sense of smell and taste. Reports " agitated dreams, bad nightmares, I many times wake up with bruises in my body,  and my daughter had heard me yelling and screaming during my sleep". Complains of increasing forgetfulness. Said that her walking is limited by pain that makes her body feel rigid. No constipation. Depressed mood. Denies " spinning sensation", but some HA. No double vision, difficulty swallowing, focal weakness, slurred speech, language or vision impairment.   CT head showed no acute abnormality.  Serologies are not revealing.  Past Medical History  Diagnosis Date  . Hypertension   . Heart murmur   . Pneumonia   . Anxiety   . Depression   . Psoriasis   . Arthritis   . Hypercholesteremia   . Congenital absence of one kidney     Pt has right kidney  only  . Fibromyalgia   . Carpal tunnel syndrome, bilateral   . Chronic kidney disease     CKD stage III, absence of left kidney  . Peripheral vascular disease     Dr. Benjamine Sprague; right external iliac stent '06 with angioplasty '13  . Polymyalgia rheumatica     Past Surgical History  Procedure Laterality Date  . Abdominal hysterectomy      Partial   . Appendectomy    . Tonsillectomy    . Eye surgery    . Iliac artery stent    . Anterior cervical decomp/discectomy fusion  04/19/2012    Procedure: ANTERIOR CERVICAL DECOMPRESSION/DISCECTOMY FUSION 2 LEVELS;  Surgeon: Winfield Cunas, MD;  Location: [facility];  Service: Neurosurgery;  Laterality: N/A;  Cervical four-five,Cervical five-six anterior cervical decompression with fusion plating and bonegraft possible posterior cervical decompression    No family history on file.  Social History:  reports that she quit smoking about 3 years ago. Her smoking use included Cigarettes. She has a 40 pack-year smoking history. She has never used smokeless tobacco. She reports that she does not drink alcohol or use illicit drugs.  Allergies  Allergen Reactions  . Gabapentin Itching  . Other Other (See Comments)    Cholesterol medications cause muscle pain  . Statins     Swelling involving tongue, rash    MEDICATIONS:                                                                                                                     I have reviewed the patient's current medications.   ROS:                                                                                                                                       History obtained from the patient and chart review  General ROS: negative for - chills, fever, night sweats, weight gain or weight loss Psychological ROS: negative for - behavioral disorder, hallucinations, or suicidal ideation Ophthalmic ROS: negative for - blurry vision, double vision, eye pain or loss of vision ENT ROS:  negative for - epistaxis, nasal discharge, oral lesions, sore throat, tinnitus or vertigo Allergy and Immunology ROS: negative for - hives or itchy/watery eyes Hematological and Lymphatic ROS: negative for - bleeding problems, bruising or swollen lymph nodes Endocrine ROS: negative for - galactorrhea, hair pattern changes, polydipsia/polyuria or temperature intolerance Respiratory ROS: negative for - cough, hemoptysis, shortness of breath or wheezing Cardiovascular ROS: negative for - chest pain, dyspnea on exertion, edema or irregular heartbeat Gastrointestinal ROS: negative for - abdominal pain, diarrhea, hematemesis, nausea/vomiting or stool incontinence Genito-Urinary ROS: negative for - dysuria, hematuria, incontinence or urinary frequency/urgency Musculoskeletal ROS: negative for - joint swelling or muscular weakness Neurological ROS: as noted in HPI Dermatological ROS: negative for rash and skin lesion changes   Physical exam: pleasant female in no apparent distress.Blood pressure 117/68, pulse 81, temperature 97.4 F (36.3 C), temperature source Axillary, resp. rate 16, height 5' (1.524 m), weight 66.271 kg (146 lb 1.6 oz), SpO2 96.00%. Head: normocephalic. Neck: supple, no bruits, no JVD. Cardiac: no murmurs. Lungs: clear. Abdomen:  soft, no tender, no mass. Extremities: no edema. CV: pulses palpable throughout  Neurologic Examination:                                                                                                      Mental Status: Alert, oriented, thought content appropriate.  Speech fluent without evidence of aphasia.  Able to follow 3 step commands without difficulty. Cranial Nerves: II: Discs flat bilaterally; Visual fields grossly normal, pupils equal, round, reactive to light and accommodation III,IV, VI: ptosis not present, extra-ocular motions intact bilaterally V,VII: smile symmetric, facial light touch sensation normal bilaterally VIII: hearing normal  bilaterally IX,X: gag reflex present XI: bilateral shoulder shrug XII: midline tongue extension without atrophy or fasciculations Motor: Moves all limbs symmetrically Tone and bulk:normal tone throughout; no atrophy noted Sensory: Pinprick and light touch intact throughout, bilaterally Deep Tendon Reflexes:  1+ all over Plantars: Right: downgoing   Left: downgoing Cerebellar: Bilateral postural tremors with perhaps a cerebellar component. Gait:  Patient became very lightheaded when she tried to get up from bed and walk and thus I deferred gait testing.    Lab Results  Component Value Date/Time   CHOL 236* 06/27/2013  9:09 AM    Results for orders placed during the hospital encounter of 10/04/13 (from the past 48 hour(s))  PROTIME-INR     Status: None   Collection Time    10/04/13  7:35 PM      Result Value Ref Range   Prothrombin Time 13.0  11.6 - 15.2 seconds   INR 1.00  0.00 - 1.49  APTT     Status: None   Collection Time    10/04/13  7:35 PM      Result Value Ref Range   aPTT 32  24 - 37 seconds  CBC     Status: Abnormal   Collection Time    10/04/13  7:35 PM      Result Value Ref Range   WBC 9.0  4.0 - 10.5 K/uL   RBC 3.86 (*) 3.87 - 5.11 MIL/uL   Hemoglobin 13.2  12.0 - 15.0 g/dL   HCT 36.9  36.0 - 46.0 %   MCV 95.6  78.0 - 100.0 fL   MCH 34.2 (*) 26.0 - 34.0 pg   MCHC 35.8  30.0 - 36.0 g/dL   RDW 13.0  11.5 - 15.5 %   Platelets 205  150 - 400 K/uL  DIFFERENTIAL     Status: None   Collection Time    10/04/13  7:35 PM      Result Value Ref Range   Neutrophils Relative % 72  43 - 77 %   Neutro Abs 6.5  1.7 - 7.7 K/uL   Lymphocytes Relative 21  12 - 46 %   Lymphs Abs 1.9  0.7 - 4.0 K/uL   Monocytes Relative 6  3 - 12 %   Monocytes Absolute 0.6  0.1 - 1.0 K/uL   Eosinophils Relative 0  0 - 5 %   Eosinophils Absolute 0.0  0.0 - 0.7 K/uL  Basophils Relative 0  0 - 1 %   Basophils Absolute 0.0  0.0 - 0.1 K/uL  COMPREHENSIVE METABOLIC PANEL     Status:  Abnormal   Collection Time    10/04/13  7:35 PM      Result Value Ref Range   Sodium 139  137 - 147 mEq/L   Potassium 3.7  3.7 - 5.3 mEq/L   Chloride 97  96 - 112 mEq/L   CO2 28  19 - 32 mEq/L   Glucose, Bld 84  70 - 99 mg/dL   BUN 24 (*) 6 - 23 mg/dL   Creatinine, Ser 1.57 (*) 0.50 - 1.10 mg/dL   Calcium 10.0  8.4 - 10.5 mg/dL   Total Protein 7.0  6.0 - 8.3 g/dL   Albumin 4.0  3.5 - 5.2 g/dL   AST 21  0 - 37 U/L   ALT 12  0 - 35 U/L   Alkaline Phosphatase 53  39 - 117 U/L   Total Bilirubin <0.2 (*) 0.3 - 1.2 mg/dL   GFR calc non Af Amer 31 (*) >90 mL/min   GFR calc Af Amer 36 (*) >90 mL/min   Comment: (NOTE)     The eGFR has been calculated using the CKD EPI equation.     This calculation has not been validated in all clinical situations.     eGFR's persistently <90 mL/min signify possible Chronic Kidney     Disease.  Randolm Idol, ED     Status: None   Collection Time    10/04/13  7:49 PM      Result Value Ref Range   Troponin i, poc 0.01  0.00 - 0.08 ng/mL   Comment 3            Comment: Due to the release kinetics of cTnI,     a negative result within the first hours     of the onset of symptoms does not rule out     myocardial infarction with certainty.     If myocardial infarction is still suspected,     repeat the test at appropriate intervals.  URINALYSIS, ROUTINE W REFLEX MICROSCOPIC     Status: Abnormal   Collection Time    10/04/13  8:02 PM      Result Value Ref Range   Color, Urine YELLOW  YELLOW   APPearance CLEAR  CLEAR   Specific Gravity, Urine 1.003 (*) 1.005 - 1.030   pH 6.0  5.0 - 8.0   Glucose, UA NEGATIVE  NEGATIVE mg/dL   Hgb urine dipstick NEGATIVE  NEGATIVE   Bilirubin Urine NEGATIVE  NEGATIVE   Ketones, ur NEGATIVE  NEGATIVE mg/dL   Protein, ur NEGATIVE  NEGATIVE mg/dL   Urobilinogen, UA 0.2  0.0 - 1.0 mg/dL   Nitrite NEGATIVE  NEGATIVE   Leukocytes, UA SMALL (*) NEGATIVE  URINE MICROSCOPIC-ADD ON     Status: Abnormal   Collection Time     10/04/13  8:02 PM      Result Value Ref Range   Squamous Epithelial / LPF RARE  RARE   WBC, UA 3-6  <3 WBC/hpf   RBC / HPF 0-2  <3 RBC/hpf   Bacteria, UA FEW (*) RARE   Urine-Other RARE YEAST    HEMOGLOBIN A1C     Status: None   Collection Time    10/05/13  4:50 AM      Result Value Ref Range   Hemoglobin A1C 5.0  <5.7 %  Comment: (NOTE)                                                                               According to the ADA Clinical Practice Recommendations for 2011, when     HbA1c is used as a screening test:      >=6.5%   Diagnostic of Diabetes Mellitus               (if abnormal result is confirmed)     5.7-6.4%   Increased risk of developing Diabetes Mellitus     References:Diagnosis and Classification of Diabetes Mellitus,Diabetes     ZJQB,3419,37(TKWIO 1):S62-S69 and Standards of Medical Care in             Diabetes - 2011,Diabetes XBDZ,3299,24 (Suppl 1):S11-S61.   Mean Plasma Glucose 97  <117 mg/dL   Comment: Performed at [place]     Status: None   Collection Time    10/05/13  4:50 AM      Result Value Ref Range   Sed Rate 9  0 - 22 mm/hr    Ct Head Wo Contrast  10/04/2013   CLINICAL DATA:  Dizziness and fatigue  EXAM: CT HEAD WITHOUT CONTRAST  TECHNIQUE: Contiguous axial images were obtained from the base of the skull through the vertex without intravenous contrast.  COMPARISON:  10/16/2012  FINDINGS: Ventricles are normal in size, for this patient's age, and normal in configuration. There are no parenchymal masses or mass effect. There is minor periventricular white matter hypoattenuation most consistent with chronic microvascular ischemic change there is no evidence of an infarct.  There are no extra-axial masses or abnormal fluid collections.  There is no intracranial hemorrhage.  Visualized sinuses and mastoid air cells are clear. No skull lesion.  IMPRESSION: 1. No acute intracranial abnormalities. 2. Age related volume  loss. Minor chronic microvascular ischemic change.   Electronically Signed   By: Lajean Manes M.D.   On: 10/04/2013 19:53   Assessment/Plan: 73 y/o with worsening fatigue, dizziness, forgetfulness, impaired sense of smell, probable Rem sleep behavior disorder. Neuro-exam shows not frank evidence of a rigid-akinetic or a cerebellar syndrome (the bilateral postural tremor could be medication related). However, she has a constellation of symptoms that could be related to early non motor manifestations of PD or even a tautopathy. Deconditioning?  Agree with MRI brain. Will follow up.  Dorian Pod, MD 10/05/2013, 2:53 PM [facility]

## 2013-10-05 NOTE — Progress Notes (Signed)
Family Medicine Teaching Service Daily Progress Note Intern Pager: [phone]  Patient name: [name] [name] record number: [id number] Date of birth: 24-Feb-1941 Age: 73 y.o. Gender: female  Primary Care Provider: Kenn File, MD Consultants: Neurology Code Status: Full  Pt Overview and Major Events to Date:  10/04/13: Admission  Assessment and Plan: [name] [name] is a 73 y.o. female presenting with fatigue and dizziness x 1 week. PMH is significant for HTN, PVD (s/p right leg stent), HLD, PMR, GERD, anxiety/depression, and fibromyalgia   # Weakness and ?CVA?: Etiology is unclear. Pt w/ possible cerebellar stroke vs medication induced vs early parkinsonism vs deconditioning vs BPPV, vs PMR/adrenal insufficiency from steroid use vs CHF.  Patient currently taking ASA and plavix daily. Labs unremarkable, CT head with no acute abnormalities, chronic microvascular ischemic changes. EKG is NSR. Findings on exam with dysmetria (vs cogwheel), decreased right sensation of face, and rhomberg positive. Complains of dizziness on sitting up but no definite nystagmus noted. - Neuro consult today and appreciate their insight and recommendations - continue ASA 39m, plavix 762m - allow permissive HTN, hold home BP meds  - MRI/MRA head  - carotid dopplers, 2d echo  - Risk strat: A1c, lipid in 06/2013 with (Chol 236, TG 112, HDL 76, LDL 138)  - PT/OT and vestibular evals  - RN bedside swallow, if fails again SLP eval  - Hold flexeril and trazodone for now   # AoCKD: PT w/ congenital abscence of L kidney, and w/ h/o CKD. Cr elevated to 1.57 on admission. Difficult to tell baseline but trending up over past 2 years. BP at goal. No h/o DM.  - Avoid renally toxic medications  - IVF as below.   # Polymyalgia Rheumatica: suspect that fatigue may be from PMR flare. Takes 31m36mrednisone daily.  - check ESR with AM labs  - Increase from home 31mg13mednisone to 10mg52m GERD: not currently on PPI,  symptomatic recently  - will start protonix if passes swallow eval   # HLD: cannot tolerate statins, on fenofibrate  - continue once passed swallow eval   # HTN: Normotensive to low BP overall. on amlodipine 10mg,73morhtalidone 231mg  40mlding   # Anxiety/depression:  - continue zoloft   FEN/GI: 1/2NS at 75cc/hr, NPO pending swallow eval   Prophylaxis: heparin  Disposition: pending clinical improvement  Subjective:  No change in overall condition this am.   Objective: Temp:  [96.9 F (36.1 C)-98.6 F (37 C)] 98.6 F (37 C) (03/29 0600) Pulse Rate:  [73-103] 81 (03/29 0744) Resp:  [14-20] 17 (03/29 0744) BP: (103-136)/(52-76) 110/52 mmHg (03/29 0744) SpO2:  [92 %-98 %] 92 % (03/29 0744) Weight:  [145 lb (65.772 kg)-146 lb 1.6 oz (66.271 kg)] 146 lb 1.6 oz (66.271 kg) (03/28 2343) Physical Exam: General: NAD  HEENT: Pupils dilated but equal, reactive to light. EOMI,  MMM.  Cardiovascular: RRR, normal s1/s2, no murmur appreciated.  Respiratory: CTAB, normal effort  Abdomen: soft, mildly tender on left side, no rebound or guarding. Bowel sounds present  Extremities: no edema/cyanosis  Skin: diffuse age-related atrophy, no erythematous rashes  Neuro: alert and oriented. CN grossly intact. Rhomberg +, finger to nose w/ some difficulty and jerking movements bilat. Strength 4/5 arm flexion/extension, 4/5 grip and hip flexion/extension 4/5 bilat. Unstable small stepping gait  Laboratory:  Recent Labs Lab 10/04/13 1935  WBC 9.0  HGB 13.2  HCT 36.9  PLT 205    Recent Labs Lab 10/04/13  1935  NA 139  K 3.7  CL 97  CO2 28  BUN 24*  CREATININE 1.57*  CALCIUM 10.0  PROT 7.0  BILITOT <0.2*  ALKPHOS 53  ALT 12  AST 21  GLUCOSE 84   Ct Head Wo Contrast  10/04/2013   CLINICAL DATA:  Dizziness and fatigue  EXAM: CT HEAD WITHOUT CONTRAST  TECHNIQUE: Contiguous axial images were obtained from the base of the skull through the vertex without intravenous contrast.   COMPARISON:  10/16/2012  FINDINGS: Ventricles are normal in size, for this patient's age, and normal in configuration. There are no parenchymal masses or mass effect. There is minor periventricular white matter hypoattenuation most consistent with chronic microvascular ischemic change there is no evidence of an infarct.  There are no extra-axial masses or abnormal fluid collections.  There is no intracranial hemorrhage.  Visualized sinuses and mastoid air cells are clear. No skull lesion.  IMPRESSION: 1. No acute intracranial abnormalities. 2. Age related volume loss. Minor chronic microvascular ischemic change.   Electronically Signed   By: Lajean Manes M.D.   On: 10/04/2013 19:53       Waldemar Dickens, MD 10/05/2013, 8:29 AM PGY-3, [place] Intern pager: [phone], text pages welcome

## 2013-10-05 NOTE — Evaluation (Signed)
Physical Therapy Evaluation Patient Details Name: [name] [name] MRN: [id number] DOB: 1941/06/30 Today's Date: 10/05/2013   History of Present Illness   [name] [name] is a 73 y.o. female presenting with fatigue and dizziness x 1 week. PMH is significant for HTN, PVD (s/p right leg stent), HLD, PMR, GERD, anxiety/depression, and fibromyalgia    Clinical Impression  Pt presents with multiple medical issues limiting independence with functional mobility.  Assessed for vestibular dysfunction, history of hearing loss following sinus infection 3 years ago suggestive of labyrinthitis-type etiology.  (+) vertigo with modified side-lying test but no nystagmus, consistent with peripheral hypofunction.  Saccades and smooth pursuits normal.     Pt will benefit from PT postacute to address deficits, and recommending CIR consult to assess for potential admission give multiple co-morbidities.  Pt has assist at d/c however daughter has terminal illness and received medical care at [facility] (per patient).  If CIR not an option, recommend additional days in acute prior to d/c home and next venue depending on progress.    Follow Up Recommendations CIR (pending admissions review, may consider alternative plan)    Equipment Recommendations  None recommended by PT    Recommendations for Other Services Rehab consult     Precautions / Restrictions Precautions Precautions: Fall Precaution Comments: Assist when up, use RW.  multiple risk factors include dizziness, visual deficits, muscle weakness and pain.      Mobility  Bed Mobility Overal bed mobility: Needs Assistance Bed Mobility: Sidelying to Sit;Sit to Sidelying   Sidelying to sit: Min assist     Sit to sidelying: Min assist General bed mobility comments: assist legs to bed surface; limited due to pain and dizzness with position chages  Transfers Overall transfer level: Needs assistance Equipment used: Rolling walker (2 wheeled) Transfers: Sit to/from  [facility] Sit to Stand: Min assist Stand pivot transfers: Mod assist       General transfer comment: incr assist needed without device; human assist at all times due to instabilty and dizziness along with pain for fall risk/fall prevention  Ambulation/Gait Ambulation/Gait assistance: Min assist Ambulation Distance (Feet): 60 Feet Assistive device: Rolling walker (2 wheeled) Gait Pattern/deviations: Step-through pattern;Decreased stride length;Scissoring;Antalgic;Trunk flexed;Narrow base of support Gait velocity: 0.72 ft/sec top speed Gait velocity interpretation: <1.8 ft/sec, indicative of risk for recurrent falls General Gait Details: very slow gait initially with frequent instructional cues for finding visual target to compensate for gaze instabilty and constant mild-mod dizziness during ambulation; incr unsteady with 180 degree turn requiring hands on assist to help regain balance.  limted by pain form back as well  Stairs            Wheelchair Mobility    Modified Rankin (Stroke Patients Only) Modified Rankin (Stroke Patients Only) Pre-Morbid Rankin Score: Moderate disability Modified Rankin: Severe disability     Balance Overall balance assessment: Needs assistance Sitting-balance support: No upper extremity supported;Feet unsupported Sitting balance-Leahy Scale: Poor Sitting balance - Comments: especially after moving into sitting, pt requires hands on assist to maintain sitting balance; once vertigo clears pt is able to sit with supervision with at least 1 UE for safety Postural control: Posterior lean (intermittent and related to vestibular instability) Standing balance support: No upper extremity supported Standing balance-Leahy Scale: Fair Standing balance comment: with cues to find focal target, pt able to maintain quiet standing with feet naturally postioned.  See Rhomberg for other deficit incl incr  sway with incr challenge Single Leg Stance -  Right Leg:  0 Single Leg Stance - Left Leg: 0 Tandem Stance - Right Leg: 0 Tandem Stance - Left Leg: 0 Rhomberg - Eyes Opened: 20 Rhomberg - Eyes Closed: 10         Pertinent Vitals/Pain Chronic back pain, neck pain, pt asking for pain meds    Home Living Family/patient expects to be discharged to:: Private residence Living Arrangements: Children Available Help at Discharge: Family Type of Home: House Home Access: Stairs to enter   Technical brewer of Steps: 4 Home Layout: One level Home Equipment: Environmental consultant - 2 wheels Additional Comments: daughter helps with household care, but also has 'terminal illness' per patient    Prior Function Level of Independence: Independent with assistive device(s)         Comments: decr over past 2-3 months     Hand Dominance        Extremity/Trunk Assessment   Upper Extremity Assessment: Defer to OT evaluation;Overall WFL for tasks assessed   Finger to nose accurate bilaterally with symmetrical tremor/ataxia of movement         Lower Extremity Assessment: Generalized weakness         Communication   Communication: HOH (left ear)  Cognition Arousal/Alertness: Lethargic;Awake/alert (difficulty keeping eyes open) Behavior During Therapy: WFL for tasks assessed/performed;Flat affect Overall Cognitive Status: Within Functional Limits for tasks assessed                      General Comments General comments (skin integrity, edema, etc.): Pt reports robust history consistent with peripheral vestibular dysfunction including 3 yrs ago had a sinus infection, then experience tinnitus and hearing loss along with episode of vertigo.      Exercises  performed 2 sets of 1 repetition of Brandt-Daroff, provoking vertigo at >7/10 severity, lasting < 1 min; worse sit>side and subsequent duration reduced.        Assessment/Plan    PT Assessment Patient needs continued PT services  PT Diagnosis Difficulty walking (peripheral  vestibular hypofunction)   PT Problem List Pain;Decreased coordination;Decreased mobility;Decreased balance;Decreased activity tolerance;Decreased range of motion;Decreased strength  PT Treatment Interventions Manual techniques;Patient/family education;Balance training;Therapeutic exercise;Therapeutic activities;Functional mobility training;Stair training;Gait training;DME instruction;Other (comment) (habituation, gaze stability/adaptation)   PT Goals (Current goals can be found in the Care Plan section) Acute Rehab PT Goals Patient Stated Goal: no more dizziness, feel better, go to gym, drive PT Goal Formulation: With patient Time For Goal Achievement: 10/19/13 Potential to Achieve Goals: Good    Frequency Min 3X/week   Barriers to discharge Decreased caregiver support (daughter has medical problems)      End of Session   Activity Tolerance: Patient tolerated treatment well (modified to minimize vestibular PROVOCATION ) Patient left: in bed;with call bell/phone within reach         Time: 0940-1033 PT Time Calculation (min): 53 min   Charges:   PT Evaluation $Initial PT Evaluation Tier I: 1 Procedure PT Treatments $Gait Training: 8-22 mins $Therapeutic Exercise: 8-22 mins $Therapeutic Activity: 8-22 mins   PT G Codes:          Herbie Drape 10/05/2013, 10:41 AM

## 2013-10-06 ENCOUNTER — Inpatient Hospital Stay (HOSPITAL_COMMUNITY): Payer: Medicare HMO

## 2013-10-06 DIAGNOSIS — E785 Hyperlipidemia, unspecified: Secondary | ICD-10-CM

## 2013-10-06 DIAGNOSIS — R5383 Other fatigue: Secondary | ICD-10-CM

## 2013-10-06 DIAGNOSIS — R5381 Other malaise: Secondary | ICD-10-CM

## 2013-10-06 LAB — GLUCOSE, CAPILLARY
Glucose-Capillary: 119 mg/dL — ABNORMAL HIGH (ref 70–99)
Glucose-Capillary: 144 mg/dL — ABNORMAL HIGH (ref 70–99)
Glucose-Capillary: 119 mg/dL — ABNORMAL HIGH (ref 70–99)
Glucose-Capillary: 124 mg/dL — ABNORMAL HIGH (ref 70–99)

## 2013-10-06 MED ORDER — CYCLOBENZAPRINE HCL 10 MG PO TABS
10.0000 mg | ORAL_TABLET | Freq: Three times a day (TID) | ORAL | Status: DC | PRN
  Filled 2013-10-06: qty 1

## 2013-10-06 MED ORDER — PANTOPRAZOLE SODIUM 40 MG PO TBEC
40.0000 mg | DELAYED_RELEASE_TABLET | Freq: Every day | ORAL | Status: DC
  Administered 2013-10-06 – 2013-10-07 (×2): 40 mg via ORAL
  Filled 2013-10-06: qty 1

## 2013-10-06 MED ORDER — LORAZEPAM 1 MG PO TABS
1.0000 mg | ORAL_TABLET | ORAL | Status: AC
  Administered 2013-10-06: 1 mg via ORAL
  Filled 2013-10-06: qty 1

## 2013-10-06 NOTE — Progress Notes (Signed)
Occupational Therapy Evaluation Patient Details Name: [name] [name] MRN: [id number] DOB: [birth year] Today's Date: 10/06/2013    History of Present Illness 73 y.o. female presenting with fatigue and dizziness x 1 week. PMH is significant for HTN, PVD (s/p right leg stent), HLD, PMR, GERD, anxiety/depression   Clinical Impression   Pt admitted with above. She demonstrates the below listed deficits and will benefit from continued OT to maximize safety and independence with BADLs.  Pt requires min A for LB ADLs due to dizziness and impaired balance.  She demonstrates tremors bil. UEs.  Pt with complaint of Lt eye pain with sustained Rt gaze and describes the sensation of moving with saccades.   She would benefit from CIR stay to allow her to return home safely with reduced risk of falls.  At this time she is deconditioned.       Follow Up Recommendations  CIR;Supervision/Assistance - 24 hour    Equipment Recommendations  None recommended by OT    Recommendations for Other Services       Precautions / Restrictions Precautions Precautions: Fall Precaution Comments: Assist when up, use RW.  multiple risk factors include dizziness, visual deficits, muscle weakness and pain. Restrictions Weight Bearing Restrictions: No      Mobility Bed Mobility Overal bed mobility: Needs Assistance Bed Mobility: Supine to Sit;Sit to Supine     Supine to sit: Supervision Sit to supine: Supervision Sit to sidelying: Supervision General bed mobility comments: cues for focusing on object to reduce dizziness  Transfers Overall transfer level: Needs assistance Equipment used: Rolling walker (2 wheeled) Transfers: Sit to/from [facility] Sit to Stand: Min assist Stand pivot transfers: Min assist       General transfer comment: Pt with dizziness upon standing and LOB posteriorly.  Attempted to reach out and grab curtain in room.  Cautioned pt against that, and instructed her to keep  hands on walker at all times.     Balance Overall balance assessment: Needs assistance Sitting-balance support: Feet unsupported;No upper extremity supported Sitting balance-Leahy Scale: Good Sitting balance - Comments: donned/doffed socks with no LOB    Standing balance support: Bilateral upper extremity supported Standing balance-Leahy Scale: Fair                      ADL Eating/Feeding: Set up;Bed level Grooming: Brushing hair;Wash/dry face;Oral care;Minimal assistance;Standing   Upper Body Dressing : Set up;Supervision/safety;Sitting Lower Body Bathing: Minimal assistance;Sit to/from stand Lower Body Dressing: Minimal assistance;Sit to/from stand Toilet Transfer: Minimal assistance;Ambulation;Comfort height toilet Toileting- Clothing Manipulation and Hygiene: Minimal assistance;Sit to/from stand   Functional mobility during ADLs: Minimal assistance;Rolling walker General ADL Comments: Pt requires min A for balance.  She has periods when she states she feels she if moving or spinning.  She does incorporate gaze stabilization techniques.       Vision Eye Alignment: Within Functional Limits Alignment/Gaze Preference: Within Defined Limits Ocular Range of Motion: Within Functional Limits Tracking/Visual Pursuits: Other (comment) Saccades: Other (comment)       Additional Comments: Pt reports Lt eye pain with sustained Rt gaze and reports the sensation of moving with saccades    Perception     Praxis Praxis Praxis tested?: Within functional limits    Pertinent Vitals/Pain      Hand Dominance     Extremity/Trunk Assessment Upper Extremity Assessment Upper Extremity Assessment: RUE deficits/detail;LUE deficits/detail RUE Deficits / Details: tremor noted bil. UEs.  No obvious dysmetria noted.  LUE Deficits / Details:  tremor noted bil. UEs.  No obvious dysmetria noted.    Lower Extremity Assessment Lower Extremity Assessment: Generalized weakness        Communication Communication Communication: HOH (left ear)   Cognition Arousal/Alertness: Awake/alert Behavior During Therapy: WFL for tasks assessed/performed;Flat affect Overall Cognitive Status: Within Functional Limits for tasks assessed                     General Comments       Exercises      Home Living Family/patient expects to be discharged to:: Private residence Living Arrangements: Children Available Help at Discharge: Family Type of Home: House Home Access: Stairs to enter Technical brewer of Steps: 4   Home Layout: One level     Bathroom Shower/Tub: Tub/shower unit Shower/tub characteristics: Curtain Biochemist, clinical: Standard     Home Equipment: Environmental consultant - 2 wheels   Additional Comments: daughter helps with household care, but also has 'terminal illness' per patient      Prior Functioning/Environment Level of Independence: Independent with assistive device(s)        Comments: Pt does not drive.  She sponge bathes due to fear of getting into tub and denies h/o falls     OT Diagnosis:     OT Problem List: Decreased strength;Decreased activity tolerance;Impaired balance (sitting and/or standing);Impaired vision/perception;Decreased coordination;Decreased knowledge of use of DME or AE;Impaired UE functional use   OT Treatment/Interventions: Self-care/ADL training;Neuromuscular education;DME and/or AE instruction;Therapeutic activities;Visual/perceptual remediation/compensation;Patient/family education;Balance training    OT Goals(Current goals can be found in the care plan section) Acute Rehab OT Goals Patient Stated Goal: to feel better OT Goal Formulation: With patient/family Time For Goal Achievement: 10/20/13 Potential to Achieve Goals: Good ADL Goals Pt Will Perform Grooming: with supervision;standing Pt Will Perform Upper Body Bathing: with set-up;sitting Pt Will Perform Lower Body Bathing: with supervision;sit to/from stand Pt Will  Perform Upper Body Dressing: with set-up;sitting Pt Will Perform Lower Body Dressing: with supervision;sit to/from stand Pt Will Transfer to Toilet: with supervision;ambulating;regular height toilet;grab bars Pt Will Perform Toileting - Clothing Manipulation and hygiene: with supervision;sit to/from stand  OT Frequency: Min 2X/week   Barriers to D/C:            End of Session: Equipment Utilized During Treatment: Rolling walker  Activity Tolerance: Patient tolerated treatment well Patient left: in bed;with call bell/phone within reach;with bed alarm set;with family/visitor present   Time: 9562-1308 OT Time Calculation (min): 25 min Charges:  OT General Charges $OT Visit: 1 Procedure OT Evaluation $Initial OT Evaluation Tier I: 1 Procedure OT Treatments $Therapeutic Activity: 8-22 mins G-Codes:    Jhordyn Hoopingarner M 10-27-13, 12:57 PM

## 2013-10-06 NOTE — Progress Notes (Signed)
Attending Addendum  I examined the patient and discussed the discharge plan with Dr. Lamar Benes. I have reviewed the note and agree.  Patient is improving slowly. Slightly dizzy today. MRI/MRA head pending to evaluate for acute CVA. Dispo planning, she may qualify for CIR. CIR consult placed.      Boykin Nearing, [place]

## 2013-10-06 NOTE — Discharge Summary (Signed)
[facility] Discharge Summary  Patient name: [name] [name] record number: [id number] Date of birth: [birth year] Age: 73 y.o. Gender: female Date of Admission: 10/04/2013  Date of Discharge: 10/07/2013 Admitting Physician: Zigmund Gottron, MD  Primary Care Provider: Kenn File, MD Consultants: Neurology  Indication for Hospitalization: Weakness, fatigue, dysmetria  Discharge Diagnoses/Problem List:  Suspected early Parkinson's Disease vs Tautopathy Peripheral vascular disease s/p right leg stent Hypertension Congenital absence of left kidney Hyperlipidemia Polymyalgia Rheumatica GERD Anxiety/depression Fibromyalgia  Disposition: home, home health services  Discharge Condition: stable, improved  Brief Hospital Course:  [name] [name] is a 73 y.o. female was admitted for worsened fatigue and dysmetria, concerning for acute cerebellar stroke. PMH significant for PVD, HTN, HLD, PMR, GERD, fibromyalgia, anxiety/depression. Initial workup with normal labs except for mild creatinine bump and negative CT head. Patient was on ASA and plavix as outpatient. She was admitted for stroke workup which included normal ESR at 9, TSH 2.542, A1c 5.0, lipid panel not repeated (done in Dec 2014, on fenofibrate), carotid doppler declined by patient, echo with EF 55-60%, MRI and MRA normal. Neurology was consulted and believed she may have early manifestations of non-motor Parkinson's Disease vs tautopathy; recommended continued outpatient workup. Evaluated by PT and believed her dizziness has a component of labyrinthitis.   Issues for Follow Up:  1. Neurologic workup/PD 2. Vestibular rehab  Significant Procedures: none  Significant Labs and Imaging:   Recent Labs Lab 10/04/13 1935  WBC 9.0  HGB 13.2  HCT 36.9  PLT 205    Recent Labs Lab 10/04/13 1935  NA 139  K 3.7  CL 97  CO2 28  GLUCOSE 84  BUN 24*  CREATININE 1.57*  CALCIUM 10.0  ALKPHOS  53  AST 21  ALT 12  ALBUMIN 4.0   TSH 2.542 Hemoglobin A1c 5.0 ESR 9 UA: small leukocytes  Ct Head Wo Contrast  10/04/2013   CLINICAL DATA:  Dizziness and fatigue  EXAM: CT HEAD WITHOUT CONTRAST  TECHNIQUE: Contiguous axial images were obtained from the base of the skull through the vertex without intravenous contrast.  COMPARISON:  10/16/2012  FINDINGS: Ventricles are normal in size, for this patient's age, and normal in configuration. There are no parenchymal masses or mass effect. There is minor periventricular white matter hypoattenuation most consistent with chronic microvascular ischemic change there is no evidence of an infarct.  There are no extra-axial masses or abnormal fluid collections.  There is no intracranial hemorrhage.  Visualized sinuses and mastoid air cells are clear. No skull lesion.   IMPRESSION: 1. No acute intracranial abnormalities. 2. Age related volume loss. Minor chronic microvascular ischemic change.   Electronically Signed   By: Lajean Manes M.D.   On: 10/04/2013 19:53   Mr Brain Wo Contrast & Mr Jodene Nam Head/brain Wo Cm  10/07/2013   CLINICAL DATA:  Stroke, worsening fatigue and dizziness for 1 week.  EXAM: MRI HEAD WITHOUT CONTRAST  MRA HEAD WITHOUT CONTRAST  TECHNIQUE: Multiplanar, multiecho pulse sequences of the brain and surrounding structures were obtained without intravenous contrast. Angiographic images of the head were obtained using MRA technique without contrast.  COMPARISON:  CT HEAD W/O CM dated 10/04/2013; MRA Head w/o contrast dated 09/14/2010; MRI Brain w/wo contrast dated 09/14/2010  FINDINGS: MRI HEAD FINDINGS  No reduced diffusion to suggest acute ischemia. No susceptibility artifact to suggest hemorrhage.  The ventricles and sulci are normal for patient's age. A few sub cm scattered supratentorial white matter FLAIR  T2 hyperintensities are present, minimally increased from prior examination, less than expected for age. No midline shift or mass effect.  No  abnormal extra-axial fluid collections. Status post bilateral ocular lens implants. No paranasal sinus air-fluid levels. Mastoid air cells are well aerated. No abnormal sellar expansion. No cerebellar tonsillar ectopia. Included view of the cervical spine demonstrates C4-5 ACDF. Patient is edentulous.  MRA HEAD FINDINGS  Anterior circulation: Normal flow related enhancement of the included cervical, petrous, cavernous and supra clinoid internal carotid arteries. Patent anterior communicating artery. Normal flow related enhancement of the anterior and middle cerebral arteries, including more distal segments.  Posterior circulation: Codominant vertebral arteries. Basilar artery is patent, with normal flow related enhancement of the main branch vessels. Normal flow related enhancement of the posterior cerebral arteries.  No large vessel occlusion, hemodynamically significant stenosis, aneurysm within the anterior nor posterior circulation. There are mild luminal irregularity suggests intracranial atherosclerosis.   IMPRESSION: MRI of the head no acute intracranial process ; normal noncontrast MRI of the brain for age.  MRA head: No hemodynamically significant stenosis; very mild luminal irregularity likely represents atherosclerosis.   Electronically Signed   By: Elon Alas   On: 10/07/2013 03:16   Results/Tests Pending at Time of Discharge: none  Discharge Medications:    Medication List         amLODipine 5 MG tablet  Commonly known as:  NORVASC  Take 2 tablets (10 mg total) by mouth daily.     aspirin EC 81 MG tablet  Take 81 mg by mouth every morning.     chlorthalidone 25 MG tablet  Commonly known as:  HYGROTON  Take 1 tablet (25 mg total) by mouth daily.     clopidogrel 75 MG tablet  Commonly known as:  PLAVIX  Take 75 mg by mouth every morning.     cyclobenzaprine 10 MG tablet  Commonly known as:  FLEXERIL  Take 10 mg by mouth 3 (three) times daily as needed for muscle spasms.      fenofibrate 160 MG tablet  Take 1 tablet (160 mg total) by mouth daily.     pantoprazole 40 MG tablet  Commonly known as:  PROTONIX  Take 1 tablet (40 mg total) by mouth daily.     predniSONE 5 MG tablet  Commonly known as:  DELTASONE  Take 1 tablet (5 mg total) by mouth daily with breakfast.     risedronate 150 MG tablet  Commonly known as:  ACTONEL  Take 150 mg by mouth every 30 (thirty) days. with water on empty stomach, nothing by mouth or lie down for next 30 minutes.     sertraline 100 MG tablet  Commonly known as:  ZOLOFT  Take 150 mg by mouth every morning.     traMADol 50 MG tablet  Commonly known as:  ULTRAM  Take 1 tablet (50 mg total) by mouth every 6 (six) hours as needed for moderate pain.     traZODone 50 MG tablet  Commonly known as:  DESYREL  Take 50 mg by mouth at bedtime.        Discharge Instructions: Please refer to Patient Instructions section of EMR for full details.  Patient was counseled important signs and symptoms that should prompt return to medical care, changes in medications, dietary instructions, activity restrictions, and follow up appointments.   Follow-Up Appointments: Follow-up Information   Follow up with CAMILO, Donzetta Kohut, MD. Schedule an appointment as soon as possible for a visit in 2  weeks. (For hospital follow up)    Specialty:  Neurology   Contact information:   [place] [address] [phone]       Follow up with Kenn File, MD. Schedule an appointment as soon as possible for a visit in 1 week. (For hospital follow up)    Specialty:  Family Medicine   Contact information:   [place] [address] [phone]       Follow up with [facility]. (Outpatient Physical and Occupational for vestibular Rehab. They will call pt with appointment time. )    Specialty:  Neuro-Rehabilitation   Contact information:   [address]  [place] [address] [phone]      Tawanna Sat, MD 10/07/2013, 10:29 PM PGY-1, [place]

## 2013-10-06 NOTE — Progress Notes (Signed)
Rehab Admissions Coordinator Note:  Patient was screened by Retta Diones for appropriateness for an Inpatient Acute Rehab Consult.  Noted PT recommending CIR.  At this time, we are recommending Inpatient Rehab consult.  Retta Diones 10/06/2013, 8:47 AM  I can be reached at [phone].

## 2013-10-06 NOTE — Progress Notes (Signed)
VASCULAR LAB PRELIMINARY  PRELIMINARY  PRELIMINARY  PRELIMINARY  Patient states she had a normal carotid Doppler at [facility] within the last 2 weeks and would like to have records transferred.  She states she is having all her records transferred to [facility] with Dr. Wendi Snipes. Please advise if you would like study repeated.       Savio Albrecht, RVT 10/06/2013, 7:34 AM

## 2013-10-06 NOTE — Consult Note (Signed)
Physical Medicine and Rehabilitation Consult  Reason for Consult: Gait disorder Referring Physician: Dr. Andria Frames    HPI: [name] [name] is a 73 y.o. female with history of HTN, hyperlipidemia, PVD s/p right leg stent, anxiety, congenital abscence of L kidney, CKD, depression, PMR, and GERD who was admitted to [facility] 10/04/13 due to worsening fatigue and dizziness x one week. CT head without acute changes. Patient with fatigue, dizziness, forgetfulness, impaired sense of smell, probable REM sleep behavior disorder. Neurology consulted and questioned early non-motor manifestations of PD as well as MRI brain for workup. Patient with gaze instability, dizziness as well as tendency to lose balance backwards. MD and rehab team recommending CIR.   Review of Systems  Constitutional: Negative for fever and weight loss.  HENT: Negative for ear pain and hearing loss.   Eyes: Positive for blurred vision and double vision.  Gastrointestinal: Negative for heartburn and nausea.  Genitourinary: Negative for dysuria and urgency.  Neurological: Positive for dizziness, tingling, tremors, sensory change, speech change and focal weakness.    Past Medical History  Diagnosis Date  . Hypertension   . Heart murmur   . Pneumonia   . Anxiety   . Depression   . Psoriasis   . Arthritis   . Hypercholesteremia   . Congenital absence of one kidney     Pt has right kidney only  . Fibromyalgia   . Carpal tunnel syndrome, bilateral   . Chronic kidney disease     CKD stage III, absence of left kidney  . Peripheral vascular disease     Dr. Benjamine Sprague; right external iliac stent '06 with angioplasty '13  . Polymyalgia rheumatica    Past Surgical History  Procedure Laterality Date  . Abdominal hysterectomy      Partial   . Appendectomy    . Tonsillectomy    . Eye surgery    . Iliac artery stent    . Anterior cervical decomp/discectomy fusion  04/19/2012    Procedure: ANTERIOR CERVICAL DECOMPRESSION/DISCECTOMY  FUSION 2 LEVELS;  Surgeon: Winfield Cunas, MD;  Location: [facility];  Service: Neurosurgery;  Laterality: N/A;  Cervical four-five,Cervical five-six anterior cervical decompression with fusion plating and bonegraft possible posterior cervical decompression   No family history on file.  Social History:  Lives with daughter. Per  reports that she quit smoking about 3 years ago. Her smoking use included Cigarettes. She has a 40 pack-year smoking history. She has never used smokeless tobacco. She reports that she does not drink alcohol or use illicit drugs.   Allergies  Allergen Reactions  . Gabapentin Itching  . Other Other (See Comments)    Cholesterol medications cause muscle pain  . Statins     Swelling involving tongue, rash   Medications Prior to Admission  Medication Sig Dispense Refill  . amLODipine (NORVASC) 5 MG tablet Take 2 tablets (10 mg total) by mouth daily.  30 tablet  11  . aspirin EC 81 MG tablet Take 81 mg by mouth every morning.      . chlorthalidone (HYGROTON) 25 MG tablet Take 1 tablet (25 mg total) by mouth daily.  30 tablet  3  . chlorthalidone (HYGROTON) 25 MG tablet Take 25 mg by mouth at bedtime.      . clopidogrel (PLAVIX) 75 MG tablet Take 75 mg by mouth every morning.       . cyclobenzaprine (FLEXERIL) 10 MG tablet Take 10 mg by mouth 3 (three) times daily as  needed for muscle spasms.      . fenofibrate 160 MG tablet Take 1 tablet (160 mg total) by mouth daily.  90 tablet  3  . predniSONE (DELTASONE) 5 MG tablet Take 1 tablet (5 mg total) by mouth daily with breakfast.  30 tablet  2  . risedronate (ACTONEL) 150 MG tablet Take 150 mg by mouth every 30 (thirty) days. with water on empty stomach, nothing by mouth or lie down for next 30 minutes.      . sertraline (ZOLOFT) 100 MG tablet Take 150 mg by mouth every morning.       . traZODone (DESYREL) 50 MG tablet Take 50 mg by mouth at bedtime.         Home: Home Living Family/patient expects to be discharged  to:: Private residence Living Arrangements: Children Available Help at Discharge: Family Type of Home: House Home Access: Stairs to enter Technical brewer of Steps: [place]: One level Home Equipment: [name] - 2 wheels Additional Comments: daughter helps with household care, but also has 'terminal illness' per patient  Functional History: Prior Function Level of Independence: Independent with assistive device(s) Comments: Pt does not drive.  She sponge bathes due to fear of getting into tub and denies h/o falls  Functional Status:  Mobility: Bed Mobility Overal bed mobility: Needs Assistance Bed Mobility: Supine to Sit;Sit to Supine Sidelying to sit: Min assist Supine to sit: Supervision Sit to supine: Supervision Sit to sidelying: Supervision General bed mobility comments: cues for focusing on object to reduce dizziness Transfers Overall transfer level: Needs assistance Equipment used: Rolling walker (2 wheeled) Transfers: Sit to/from [facility] Sit to Stand: Min assist Stand pivot transfers: Min assist General transfer comment: Pt with dizziness upon standing and LOB posteriorly.  Attempted to reach out and grab curtain in room.  Cautioned pt against that, and instructed her to keep hands on walker at all times.  Ambulation/Gait Ambulation/Gait assistance: Min assist Ambulation Distance (Feet): 150 Feet Assistive device: Rolling walker (2 wheeled) Gait Pattern/deviations: Step-through pattern;Decreased stride length;Scissoring;Antalgic;Trunk flexed;Narrow base of support Gait velocity: 0.72 ft/sec top speed Gait velocity interpretation: Below normal speed for age/gender General Gait Details: pt requires 2 x standing rest break due to c/o dizziness after she "lost focus" on her target.  Pt able to turn head to L x 2 but then reports "I feel nauseaus".  min guard for balance due to continuous c/o dizziness    ADL: ADL Overall ADL's : Needs  assistance/impaired Eating/Feeding: Set up;Bed level Grooming: Brushing hair;Wash/dry face;Oral care;Minimal assistance;Standing Upper Body Bathing: Set up;Supervision/ safety;Sitting Lower Body Bathing: Minimal assistance;Sit to/from stand Upper Body Dressing : Set up;Supervision/safety;Sitting Lower Body Dressing: Minimal assistance;Sit to/from stand Toilet Transfer: Minimal assistance;Ambulation;Comfort height toilet Toileting- Clothing Manipulation and Hygiene: Minimal assistance;Sit to/from stand Functional mobility during ADLs: Minimal assistance;Rolling walker General ADL Comments: Pt requires min A for balance.  She has periods when she states she feels she if moving or spinning.  She does incorporate gaze stabilization techniques.    Cognition: Cognition Overall Cognitive Status: Within Functional Limits for tasks assessed Orientation Level: Oriented X4 Cognition Arousal/Alertness: Awake/alert Behavior During Therapy: WFL for tasks assessed/performed;Flat affect Overall Cognitive Status: Within Functional Limits for tasks assessed  Blood pressure 123/72, pulse 73, temperature 98.2 F (36.8 C), temperature source Oral, resp. rate 16, height 5' (1.524 m), weight 66.271 kg (146 lb 1.6 oz), SpO2 95.00%. Physical Exam  Constitutional: She appears well-developed and well-nourished.  HENT:  Head: Normocephalic and  atraumatic.  Eyes: Conjunctivae are normal. Pupils are equal, round, and reactive to light.  Neck: No JVD present. No tracheal deviation present. No thyromegaly present.  Cardiovascular: Normal rate.   Respiratory: Effort normal.  GI: She exhibits no distension.  Lymphadenopathy:    She has no cervical adenopathy.  Neurological:  Difficulty with visual tracking. Saw 4 beats of horizontal nystagmus with gaze to the right---the rest of her oculovestibular exam she guarded by closing eyes. Speech was generally clear. No gross sensory abnl. No hearing loss. Substantial low  frequency intentional tremors UE>LE. No focal sensory loss seen. Strength was essentially 4/5. No frank limb ataxia seen.  Psychiatric: She has a normal mood and affect. Her behavior is normal. Thought content normal.    Results for orders placed during the hospital encounter of 10/04/13 (from the past 24 hour(s))  GLUCOSE, CAPILLARY     Status: Abnormal   Collection Time    10/06/13  8:16 AM      Result Value Ref Range   Glucose-Capillary 119 (*) 70 - 99 mg/dL   Ct Head Wo Contrast  10/04/2013   CLINICAL DATA:  Dizziness and fatigue  EXAM: CT HEAD WITHOUT CONTRAST  TECHNIQUE: Contiguous axial images were obtained from the base of the skull through the vertex without intravenous contrast.  COMPARISON:  10/16/2012  FINDINGS: Ventricles are normal in size, for this patient's age, and normal in configuration. There are no parenchymal masses or mass effect. There is minor periventricular white matter hypoattenuation most consistent with chronic microvascular ischemic change there is no evidence of an infarct.  There are no extra-axial masses or abnormal fluid collections.  There is no intracranial hemorrhage.  Visualized sinuses and mastoid air cells are clear. No skull lesion.  IMPRESSION: 1. No acute intracranial abnormalities. 2. Age related volume loss. Minor chronic microvascular ischemic change.   Electronically Signed   By: Lajean Manes M.D.   On: 10/04/2013 19:53    Assessment/Plan: Diagnosis: gait disorder. ?cerebellar infarct 1. Does the need for close, 24 hr/day medical supervision in concert with the patient's rehab needs make it unreasonable for this patient to be served in a less intensive setting? Potentially 2. Co-Morbidities requiring supervision/potential complications: anxiety with depression, PVD, htn 3. Due to bladder management, bowel management, safety, skin/wound care, disease management, medication administration, pain management and patient education, does the patient  require 24 hr/day rehab nursing? Yes 4. Does the patient require coordinated care of a physician, rehab nurse, PT (1-2 hrs/day, 5 days/week) and OT (1-2 hrs/day, 5 days/week) to address physical and functional deficits in the context of the above medical diagnosis(es)? Yes and Potentially Addressing deficits in the following areas: balance, endurance, locomotion, strength, transferring, bowel/bladder control, bathing, dressing, feeding, grooming and psychosocial support 5. Can the patient actively participate in an intensive therapy program of at least 3 hrs of therapy per day at least 5 days per week? Potentially 6. The potential for patient to make measurable gains while on inpatient rehab is good and fair 7. Anticipated functional outcomes upon discharge from inpatient rehab are modified independent  with PT, modified independent with OT, n/a with SLP. 8. Estimated rehab length of stay to reach the above functional goals is: potentially 7+ days pending work up/diagnosis 9. Does the patient have adequate social supports to accommodate these discharge functional goals? Potentially 10. Anticipated D/C setting: Home 11. Anticipated post D/C treatments: [facility] 12. Overall Rehab/Functional Prognosis: good  RECOMMENDATIONS: This patient's condition is appropriate for continued rehabilitative  care in the following setting: to be determined Patient has agreed to participate in recommended program. Potentially Note that insurance prior authorization may be required for reimbursement for recommended care.  Comment: Awaiting MRI. Pt with nystagmus, particularly with right lateral gaze on exam, but some inconsistencies seen. Will follow along for now.   Meredith Staggers, MD, [facility]     10/06/2013

## 2013-10-06 NOTE — Progress Notes (Signed)
Physical Therapy Treatment Patient Details Name: [name] [name] MRN: [id number] DOB: 1941-05-14 Today's Date: 10-15-2013    History of Present Illness 73 y.o. female presenting with fatigue and dizziness x 1 week. PMH is significant for HTN, PVD (s/p right leg stent), HLD, PMR, GERD, anxiety/depression    PT Comments    Pt able to focus on target to reduce dizziness, requires min A for balance when she loses focus on target.  Will need 24 hour supervision at home if not CIR  Follow Up Recommendations  CIR;Supervision/Assistance - 24 hour     Equipment Recommendations  None recommended by PT    Recommendations for Other Services Rehab consult     Precautions / Restrictions Precautions Precautions: Fall Precaution Comments: Assist when up, use RW.  multiple risk factors include dizziness, visual deficits, muscle weakness and pain. Restrictions Weight Bearing Restrictions: No    Mobility  Bed Mobility   Bed Mobility: Supine to Sit;Sit to Supine       Sit to supine: Supervision Sit to sidelying: Supervision General bed mobility comments: cues for focusing on object to reduce dizziness  Transfers   Equipment used: Rolling walker (2 wheeled)   Sit to Stand: Min guard         General transfer comment: cues for gaze stabilization, steadying balance assist  Ambulation/Gait Ambulation/Gait assistance: Min assist Ambulation Distance (Feet): 150 Feet Assistive device: Rolling walker (2 wheeled)     Gait velocity interpretation: Below normal speed for age/gender General Gait Details: pt requires 2 x standing rest break due to c/o dizziness after she "lost focus" on her target.  Pt able to turn head to L x 2 but then reports "I feel nauseaus".  min guard for balance due to continuous c/o dizziness   Stairs            Wheelchair Mobility    Modified Rankin (Stroke Patients Only)       Balance                                    Cognition  Arousal/Alertness: Awake/alert Behavior During Therapy: WFL for tasks assessed/performed Overall Cognitive Status: Within Functional Limits for tasks assessed                      Exercises Other Exercises Other Exercises: gaze stabilization with head turns slowly all directions    General Comments        Pertinent Vitals/Pain No c/o pain, limited by dizziness    Home Living                      Prior Function            PT Goals (current goals can now be found in the care plan section) Progress towards PT goals: Progressing toward goals    Frequency  Min 3X/week    PT Plan Current plan remains appropriate    End of Session Equipment Utilized During Treatment: Gait belt Activity Tolerance: Patient tolerated treatment well (limited by dizziness) Patient left: in bed;with call bell/phone within reach     Time: 0915-0939 PT Time Calculation (min): 24 min  Charges:  $Gait Training: 8-22 mins $Therapeutic Activity: 8-22 mins                    G Codes:      DONAWERTH,KAREN Oct 15, 2013, 10:34 AM

## 2013-10-06 NOTE — Progress Notes (Signed)
Family Medicine Teaching Service Daily Progress Note Intern Pager: [phone]  Patient name: [name] [name] record number: [id number] Date of birth: [birth year] Age: 73 y.o. Gender: female  Primary Care Provider: Kenn File, MD Consultants: Neurology Code Status: Full  Pt Overview and Major Events to Date:  10/04/13: Admission  Assessment and Plan: [name] [name] is a 73 y.o. female presenting with fatigue and dizziness x 1 week. PMH is significant for HTN, PVD (s/p right leg stent), HLD, PMR, GERD, anxiety/depression, and fibromyalgia   # Weakness and ?CVA?: Etiology is unclear. Pt w/ possible cerebellar stroke vs medication induced vs early parkinsonism vs deconditioning vs BPPV, vs PMR/adrenal insufficiency from steroid use vs CHF.  Patient currently taking ASA and plavix daily. Labs unremarkable, CT head with no acute abnormalities, chronic microvascular ischemic changes. EKG is NSR. Findings on exam with dysmetria (vs cogwheel), decreased right sensation of face, and rhomberg positive. Complains of dizziness on sitting up but no definite nystagmus noted. - continue ASA 63m, plavix 733m - MRI/MRA head still pending - carotid dopplers (patient declined, had it done 2 weeks ago), 2d echo (EF 55-60%) - Risk strat: A1c 5.0, lipid in 06/2013 with (Chol 236, TG 112, HDL 76, LDL 138), TSH 2.542 - PT/OT and vestibular: eval suggesting labyrinthitis-type etiology - Hold trazodone for now, restarting flexeril for leg spasms - Neuro consult appreciate their insight and recommendations  # AoCKD: PT w/ congenital abscence of L kidney, and w/ h/o CKD. Cr elevated to 1.57 on admission. Difficult to tell baseline but trending up over past 2 years. BP at goal. No h/o DM.  - Avoid renally toxic medications  - IVF as below.   # Polymyalgia Rheumatica: Takes 36m20mrednisone daily.  - ESR wnl at 9 - Increase from home 36mg87mednisone to 10mg62m GERD: not currently on PPI, symptomatic recently  -  start protonix daily  # HLD: cannot tolerate statins, on fenofibrate  - continue fenofibrate  # HTN: Normotensive to low BP overall. on amlodipine 10mg,51morthalidone 236mg  47mlding   # Anxiety/depression:  - continue zoloft   FEN/GI: 1/2NS at 75cc/hr, NPO pending swallow eval   Prophylaxis: heparin  Disposition: pending clinical improvement  Subjective:  Still feels "miserable", same as when she was admitted. Has generalized fatigue/weakness. Also complains of muscle spasm/cramp in back of her left leg that is palpable.  Objective: Temp:  [97.4 F (36.3 C)-98.4 F (36.9 C)] 98.3 F (36.8 C) (03/30 0400) Pulse Rate:  [66-81] 66 (03/30 0400) Resp:  [16-17] 16 (03/30 0400) BP: (110-136)/(52-68) 136/66 mmHg (03/30 0400) SpO2:  [92 %-97 %] 96 % (03/30 0400) Physical Exam: General: NAD  HEENT: Pupils dilated but equal, reactive to light. EOMI,  MMM.  Cardiovascular: RRR, normal s1/s2, no murmur appreciated.  Respiratory: CTAB, normal effort  Abdomen: soft, mildly tender on left side, no rebound or guarding. Bowel sounds present  Extremities: no edema/cyanosis  Skin: diffuse age-related atrophy, no erythematous rashes  Neuro: alert and oriented. CN grossly intact. Finger to nose with intention tremor. No resting tremor noted. No pronator drift. Grip strength 5/5 bilaterally, ankle extension 4/5 bilaterally.  Laboratory:  Recent Labs Lab 10/04/13 1935  WBC 9.0  HGB 13.2  HCT 36.9  PLT 205    Recent Labs Lab 10/04/13 1935  NA 139  K 3.7  CL 97  CO2 28  BUN 24*  CREATININE 1.57*  CALCIUM 10.0  PROT 7.0  BILITOT <0.2*  ALKPHOS 53  ALT 12  AST 21  GLUCOSE 84   Ct Head Wo Contrast  10/04/2013   CLINICAL DATA:  Dizziness and fatigue  EXAM: CT HEAD WITHOUT CONTRAST  TECHNIQUE: Contiguous axial images were obtained from the base of the skull through the vertex without intravenous contrast.  COMPARISON:  10/16/2012  FINDINGS: Ventricles are normal in size, for  this patient's age, and normal in configuration. There are no parenchymal masses or mass effect. There is minor periventricular white matter hypoattenuation most consistent with chronic microvascular ischemic change there is no evidence of an infarct.  There are no extra-axial masses or abnormal fluid collections.  There is no intracranial hemorrhage.  Visualized sinuses and mastoid air cells are clear. No skull lesion.  IMPRESSION: 1. No acute intracranial abnormalities. 2. Age related volume loss. Minor chronic microvascular ischemic change.   Electronically Signed   By: Lajean Manes M.D.   On: 10/04/2013 19:53     Tawanna Sat, MD 10/06/2013, 7:17 AM PGY-1, [place] Intern pager: [phone], text pages welcome

## 2013-10-07 LAB — GLUCOSE, CAPILLARY
Glucose-Capillary: 82 mg/dL (ref 70–99)
Glucose-Capillary: 92 mg/dL (ref 70–99)

## 2013-10-07 MED ORDER — TRAMADOL HCL 50 MG PO TABS: 50.0000 mg | ORAL_TABLET | Freq: Four times a day (QID) | ORAL | Status: DC | PRN

## 2013-10-07 MED ORDER — PANTOPRAZOLE SODIUM 40 MG PO TBEC: 40.0000 mg | DELAYED_RELEASE_TABLET | Freq: Every day | ORAL | Status: DC

## 2013-10-07 NOTE — Progress Notes (Signed)
Occupational Therapy Treatment Patient Details Name: [name] [name] MRN: [id number] DOB: 07-16-1940 Today's Date: 10/07/2013    History of present illness 73 y.o. female presenting with fatigue and dizziness x 1 week. PMH is significant for HTN, PVD (s/p right leg stent), HLD, PMR, GERD, anxiety/depression   OT comments  Pt continues with dizziness which impacts her ability to perform BADLs safely.  She demonstrated Rt. Beating horizontal nystagmus with head turns today.  She is utilizing gaze fixation to compensate for dizziness.  Feel she would benefit from CIR to reduce risk of falls and allow her to return home at supervision - modified independent level.     Follow Up Recommendations  CIR;Supervision/Assistance - 24 hour    Equipment Recommendations  None recommended by OT    Recommendations for Other Services      Precautions / Restrictions Precautions Precautions: Fall Precaution Comments: Assist when up, use RW.  multiple risk factors include dizziness, visual deficits, muscle weakness and pain.       Mobility Bed Mobility Overal bed mobility: Needs Assistance Bed Mobility: Supine to Sit;Sit to Supine   Sidelying to sit: Supervision          Transfers Overall transfer level: Needs assistance Equipment used: Rolling walker (2 wheeled) Transfers: Sit to/from [facility] Sit to Stand: Min assist Stand pivot transfers: Min assist            Balance Overall balance assessment: Needs assistance Sitting-balance support: Feet supported Sitting balance-Leahy Scale: Good     Standing balance support: Single extremity supported Standing balance-Leahy Scale: Fair                     ADL   Grooming: Dance movement psychotherapist;Wash/dry hands;Brushing hair;Standing (min guard assist)         Toilet Transfer: Minimal assistance;Ambulation;Comfort height toilet Toileting- Clothing Manipulation and Hygiene: Minimal assistance;Sit to/from stand    Functional mobility during ADLs: Minimal assistance;Rolling walker General ADL Comments: Pt with rt beating horizontal nystamgmus on Rt with head turns today.   She continues to utilize gaze fixation strategies to compensate for dizziness.  Pt demonstrated significant LOB posteriorly while standing requiring assist to recover.       Vision                     Perception     Praxis      Cognition   Behavior During Therapy: [facility] for tasks assessed/performed;Flat affect Overall Cognitive Status: Within Functional Limits for tasks assessed                       Extremity/Trunk Assessment               Exercises       General Comments      Pertinent Vitals/ Pain         Home Living                                          Prior Functioning/Environment              Frequency Min 2X/week     Progress Toward Goals  OT Goals(current goals can now be found in the care plan section)  Progress towards OT goals: Progressing toward goals  Acute Rehab OT Goals Patient Stated Goal: to feel better OT Goal Formulation:  With patient/family Time For Goal Achievement: 10/20/13 Potential to Achieve Goals: Good ADL Goals Pt Will Perform Grooming: with supervision;standing Pt Will Perform Upper Body Bathing: with set-up;sitting Pt Will Perform Lower Body Bathing: with supervision;sit to/from stand Pt Will Perform Upper Body Dressing: with set-up;sitting Pt Will Perform Lower Body Dressing: with supervision;sit to/from stand Pt Will Transfer to Toilet: with supervision;ambulating;regular height toilet;grab bars Pt Will Perform Toileting - Clothing Manipulation and hygiene: with supervision;sit to/from stand  Plan Discharge plan remains appropriate    End of Session Equipment Utilized During Treatment: Rolling walker  Activity Tolerance Patient tolerated treatment well   Patient Left in chair;with call bell/phone within reach   Nurse  Communication Mobility status        Time: 4098-1191 OT Time Calculation (min): 24 min  Charges: OT General Charges $OT Visit: 1 Procedure OT Treatments $Self Care/Home Management : 8-22 mins $Therapeutic Activity: 8-22 mins  [name] [name] M 10/07/2013, 12:58 PM

## 2013-10-07 NOTE — Progress Notes (Signed)
Attending Addendum  I examined the patient and discussed the assessment and plan with Dr. Lamar Benes. I have reviewed the note and agree.  Patient still with mild dizziness. MRI/MRA negative for CVA. Agree with symptoms are suggestive of BPPV vs labyrinthitis. Patient working with PT.  Patient stable for d.c home today. With home health [place], MD [place]

## 2013-10-07 NOTE — Progress Notes (Signed)
Inpatient Rehabilitation  Note that pt. Has DC to home orders and that CM is setting him up for OP Neuro Rehab services.  Note that MRI/MRA with no acute abnormalities.  No need for IP rehab at this time.  Will sign off.  Please call if questions  [facility] Admissions Coordinator Cell [phone] Office [phone]

## 2013-10-07 NOTE — Discharge Instructions (Addendum)
You were admitted for fatigue and dizziness that was concerning for stroke. You had imaging of your head by CT and MRI, both of which showed you did not have any stroke. You were evaluated by a Physical therapist and they found that you may have an inner ear component causing some of your dizziness; we recommend that you be seen by a vestibular rehab PT either as Home Health or an Outpatient basis.  We did not change any of your medications. STROKE/TIA DISCHARGE INSTRUCTIONS SMOKING Cigarette smoking nearly doubles your risk of having a stroke & is the single most alterable risk factor  If you smoke or have smoked in the last 12 months, you are advised to quit smoking for your health.  Most of the excess cardiovascular risk related to smoking disappears within a year of stopping.  Ask you doctor about anti-smoking medications  [place] Quit Line: 1-800-QUIT NOW  Free Smoking Cessation Classes [phone]  CHOLESTEROL Know your levels; limit fat & cholesterol in your diet  Lipid Panel     Component Value Date/Time   CHOL 236* 06/27/2013 0909   TRIG 112 06/27/2013 0909   HDL 76 06/27/2013 0909   CHOLHDL 3.1 06/27/2013 0909   VLDL 22 06/27/2013 0909   LDLCALC 138* 06/27/2013 0909      Many patients benefit from treatment even if their cholesterol is at goal.  Goal: Total Cholesterol (CHOL) less than 160  Goal:  Triglycerides (TRIG) less than 150  Goal:  HDL greater than 40  Goal:  LDL (LDLCALC) less than 100   BLOOD PRESSURE American Stroke Association blood pressure target is less that 120/80 mm/Hg  Your discharge blood pressure is:  BP: 129/68 mmHg  Monitor your blood pressure  Limit your salt and alcohol intake  Many individuals will require more than one medication for high blood pressure  DIABETES (A1c is a blood sugar average for last 3 months) Goal HGBA1c is under 7% (HBGA1c is blood sugar average for last 3 months)  Diabetes: No known diagnosis of diabetes    Lab  Results  Component Value Date   HGBA1C 5.0 10/05/2013     Your HGBA1c can be lowered with medications, healthy diet, and exercise.  Check your blood sugar as directed by your physician  Call your physician if you experience unexplained or low blood sugars.  PHYSICAL ACTIVITY/REHABILITATION Goal is 30 minutes at least 4 days per week  Activity: Increase activity slowly, Therapies: Physical Therapy: Outpatient Return to work: N/A  Activity decreases your risk of heart attack and stroke and makes your heart stronger.  It helps control your weight and blood pressure; helps you relax and can improve your mood.  Participate in a regular exercise program.  Talk with your doctor about the best form of exercise for you (dancing, walking, swimming, cycling).  DIET/WEIGHT Goal is to maintain a healthy weight  Your discharge diet is: Cardiac thin liquids Your height is:  Height: 5' (152.4 cm) Your current weight is: Weight: 66.271 kg (146 lb 1.6 oz) Your Body Mass Index (BMI) is:  BMI (Calculated): 28.6  Following the type of diet specifically designed for you will help prevent another stroke.  Your goal weight range is:  90-110  Your goal Body Mass Index (BMI) is 19-24.  Healthy food habits can help reduce 3 risk factors for stroke:  High cholesterol, hypertension, and excess weight.  RESOURCES Stroke/Support Group:  Call [phone]   STROKE EDUCATION PROVIDED/REVIEWED AND GIVEN TO PATIENT Stroke warning  signs and symptoms How to activate emergency medical system (call 911). Medications prescribed at discharge. Need for follow-up after discharge. Personal risk factors for stroke. Pneumonia vaccine given: No Flu vaccine given: No  My questions have been answered, the writing is legible, and I understand these instructions.  I will adhere to these goals & educational materials that have been provided to me after my discharge from the hospital.

## 2013-10-07 NOTE — Progress Notes (Signed)
NEURO HOSPITALIST PROGRESS NOTE   SUBJECTIVE:                                                                                                                        Offers no new neurological complains. MRI brain showed no acute abnormality. Brain MRA unremarkable.  OBJECTIVE:                                                                                                                           Vital signs in last 24 hours: Temp:  [98 F (36.7 C)-98.4 F (36.9 C)] 98.3 F (36.8 C) (03/31 0815) Pulse Rate:  [72-78] 78 (03/31 0815) Resp:  [16-18] 18 (03/31 0815) BP: (123-151)/(60-73) 151/73 mmHg (03/31 0815) SpO2:  [95 %-97 %] 95 % (03/31 0815)  Intake/Output from previous day: 03/30 0701 - 03/31 0700 In: 600 [P.O.:600] Out: 1050 [Urine:1050] Intake/Output this shift:   Nutritional status: Cardiac  Past Medical History  Diagnosis Date  . Hypertension   . Heart murmur   . Pneumonia   . Anxiety   . Depression   . Psoriasis   . Arthritis   . Hypercholesteremia   . Congenital absence of one kidney     Pt has right kidney only  . Fibromyalgia   . Carpal tunnel syndrome, bilateral   . Chronic kidney disease     CKD stage III, absence of left kidney  . Peripheral vascular disease     Dr. Benjamine Sprague; right external iliac stent '06 with angioplasty '13  . Polymyalgia rheumatica     Neurologic Exam:  Mental Status:  Alert, oriented x 4, thought content appropriate. Speech fluent without evidence of aphasia. Able to follow 3 step commands without difficulty.  Cranial Nerves:  II: Discs flat bilaterally; Visual fields grossly normal, pupils equal, round, reactive to light and accommodation  III,IV, VI: ptosis not present, extra-ocular motions intact bilaterally  V,VII: smile symmetric, facial light touch sensation normal bilaterally  VIII: hearing normal bilaterally  IX,X: gag reflex present  XI: bilateral shoulder shrug  XII: midline  tongue extension without atrophy or fasciculations  Motor:  Moves all limbs symmetrically  Tone and bulk:normal tone throughout; no atrophy noted  Sensory: Pinprick and light touch intact throughout,  bilaterally  Deep Tendon Reflexes:  1+ all over  Plantars:  Right: downgoing Left: downgoing  Cerebellar:  Bilateral postural tremors with perhaps a cerebellar component.  Gait: No tested   Lab Results: Lab Results  Component Value Date/Time   CHOL 236* 06/27/2013  9:09 AM   Lipid Panel No results found for this basename: CHOL, TRIG, HDL, CHOLHDL, VLDL, LDLCALC,  in the last 72 hours  Studies/Results: Mr Brain Wo Contrast  10/07/2013   CLINICAL DATA:  Stroke, worsening fatigue and dizziness for 1 week.  EXAM: MRI HEAD WITHOUT CONTRAST  MRA HEAD WITHOUT CONTRAST  TECHNIQUE: Multiplanar, multiecho pulse sequences of the brain and surrounding structures were obtained without intravenous contrast. Angiographic images of the head were obtained using MRA technique without contrast.  COMPARISON:  CT HEAD W/O CM dated 10/04/2013; MRA Head w/o contrast dated 09/14/2010; MRI Brain w/wo contrast dated 09/14/2010  FINDINGS: MRI HEAD FINDINGS  No reduced diffusion to suggest acute ischemia. No susceptibility artifact to suggest hemorrhage.  The ventricles and sulci are normal for patient's age. A few sub cm scattered supratentorial white matter FLAIR T2 hyperintensities are present, minimally increased from prior examination, less than expected for age. No midline shift or mass effect.  No abnormal extra-axial fluid collections. Status post bilateral ocular lens implants. No paranasal sinus air-fluid levels. Mastoid air cells are well aerated. No abnormal sellar expansion. No cerebellar tonsillar ectopia. Included view of the cervical spine demonstrates C4-5 ACDF. Patient is edentulous.  MRA HEAD FINDINGS  Anterior circulation: Normal flow related enhancement of the included cervical, petrous, cavernous and supra  clinoid internal carotid arteries. Patent anterior communicating artery. Normal flow related enhancement of the anterior and middle cerebral arteries, including more distal segments.  Posterior circulation: Codominant vertebral arteries. Basilar artery is patent, with normal flow related enhancement of the main branch vessels. Normal flow related enhancement of the posterior cerebral arteries.  No large vessel occlusion, hemodynamically significant stenosis, aneurysm within the anterior nor posterior circulation. There are mild luminal irregularity suggests intracranial atherosclerosis.  IMPRESSION: MRI of the head no acute intracranial process ; normal noncontrast MRI of the brain for age.  MRA head: No hemodynamically significant stenosis; very mild luminal irregularity likely represents atherosclerosis.   Electronically Signed   By: Elon Alas   On: 10/07/2013 03:16   Mr Jodene Nam Head/brain Wo Cm  10/07/2013   CLINICAL DATA:  Stroke, worsening fatigue and dizziness for 1 week.  EXAM: MRI HEAD WITHOUT CONTRAST  MRA HEAD WITHOUT CONTRAST  TECHNIQUE: Multiplanar, multiecho pulse sequences of the brain and surrounding structures were obtained without intravenous contrast. Angiographic images of the head were obtained using MRA technique without contrast.  COMPARISON:  CT HEAD W/O CM dated 10/04/2013; MRA Head w/o contrast dated 09/14/2010; MRI Brain w/wo contrast dated 09/14/2010  FINDINGS: MRI HEAD FINDINGS  No reduced diffusion to suggest acute ischemia. No susceptibility artifact to suggest hemorrhage.  The ventricles and sulci are normal for patient's age. A few sub cm scattered supratentorial white matter FLAIR T2 hyperintensities are present, minimally increased from prior examination, less than expected for age. No midline shift or mass effect.  No abnormal extra-axial fluid collections. Status post bilateral ocular lens implants. No paranasal sinus air-fluid levels. Mastoid air cells are well aerated. No  abnormal sellar expansion. No cerebellar tonsillar ectopia. Included view of the cervical spine demonstrates C4-5 ACDF. Patient is edentulous.  MRA HEAD FINDINGS  Anterior circulation: Normal flow related enhancement of the included cervical, petrous, cavernous and  supra clinoid internal carotid arteries. Patent anterior communicating artery. Normal flow related enhancement of the anterior and middle cerebral arteries, including more distal segments.  Posterior circulation: Codominant vertebral arteries. Basilar artery is patent, with normal flow related enhancement of the main branch vessels. Normal flow related enhancement of the posterior cerebral arteries.  No large vessel occlusion, hemodynamically significant stenosis, aneurysm within the anterior nor posterior circulation. There are mild luminal irregularity suggests intracranial atherosclerosis.  IMPRESSION: MRI of the head no acute intracranial process ; normal noncontrast MRI of the brain for age.  MRA head: No hemodynamically significant stenosis; very mild luminal irregularity likely represents atherosclerosis.   Electronically Signed   By: Elon Alas   On: 10/07/2013 03:16    MEDICATIONS                                                                                                                       I have reviewed the patient's current medications.  ASSESSMENT/PLAN:                                                                                                            73 y/o with worsening fatigue, dizziness, forgetfulness, impaired sense of smell, probable Rem sleep behavior disorder.  Neuro-exam shows not frank evidence of a rigid-akinetic or a cerebellar syndrome (the bilateral postural tremor could be medication related). MRI/MRA brain unremarkable. Early non motor manifestations of PD or even a tautopathy? . Deconditioning?. Further neuro-work up can be done as outpatient. Will sign off.  Dorian Pod, MD [facility]  Neurohospitalist [phone]  10/07/2013, 9:56 AM

## 2013-10-07 NOTE — Progress Notes (Signed)
Family Medicine Teaching Service Daily Progress Note Intern Pager: [phone]  Patient name: [name] [name] record number: [id number] Date of birth: 1941/06/27 Age: 73 y.o. Gender: female  Primary Care Provider: Kenn File, MD Consultants: Neurology Code Status: Full  Pt Overview and Major Events to Date:  10/04/13: Admission  Assessment and Plan: [name] [name] is a 73 y.o. female presenting with fatigue and dizziness x 1 week. PMH is significant for HTN, PVD (s/p right leg stent), HLD, PMR, GERD, anxiety/depression, and fibromyalgia   # Weakness and ?CVA?: Etiology is unclear. Pt w/ possible cerebellar stroke vs medication induced vs early parkinsonism vs deconditioning vs BPPV, vs PMR/adrenal insufficiency from steroid use vs CHF.  Patient currently taking ASA and plavix daily. Labs unremarkable, CT head with no acute abnormalities, chronic microvascular ischemic changes. EKG is NSR. Findings on exam with dysmetria (vs cogwheel), decreased right sensation of face, and rhomberg positive. Complains of dizziness on sitting up but no definite nystagmus noted. - continue ASA 24m, plavix 771m - MRI/MRA head are negative for stroke, normal for age - carotid dopplers (patient declined, states she had it done 2 weeks ago), 2d echo (EF 55-60%) - Risk strat: A1c 5.0, lipid in 06/2013 with (Chol 236, TG 112, HDL 76, LDL 138), TSH 2.542 - PT/OT and vestibular: eval suggesting labyrinthitis-type etiology - Hold trazodone for now, restarting flexeril for leg spasms - Neuro consult appreciate their insight and recommendations - CIR consulted for possible inpt rehab: patient is agreeable, if she does not qualify she does not want to go to SNF  # AoCKD: PT w/ congenital abscence of L kidney, and w/ h/o CKD. Cr elevated to 1.57 on admission. Difficult to tell baseline but trending up over past 2 years. BP at goal. No h/o DM.  - Avoid renally toxic medications  - IVF as below.   # Polymyalgia  Rheumatica: Takes 75m19mrednisone daily.  - ESR wnl at 9 - Increase from home 75mg77mednisone to 10mg80m GERD: not currently on PPI, symptomatic recently  - start protonix daily  # HLD: cannot tolerate statins, on fenofibrate  - continue fenofibrate  # HTN: Normotensive to low BP overall. on amlodipine 10mg,40morthalidone 275mg  67mlding   # Anxiety/depression:  - continue zoloft   FEN/GI: 1/2NS at 75cc/hr Prophylaxis: heparin  Disposition: pending CIR evaluation  Subjective:  Says she feels the same today, chronic pain in low back and legs. She is agreeable to inpatient rehab, however if she doesn't qualify she does not want to go to SNF (previously stayed in one, and says she felt sad about how she was not able to help the other patients).   Objective: Temp:  [98 F (36.7 C)-98.4 F (36.9 C)] 98 F (36.7 C) (03/31 0352) Pulse Rate:  [72-78] 78 (03/31 0352) Resp:  [16-18] 16 (03/31 0352) BP: (123-138)/(60-72) 130/64 mmHg (03/31 0352) SpO2:  [95 %-97 %] 95 % (03/31 0352) Physical Exam: General: NAD  HEENT: Pupils dilated but equal, reactive to light. EOMI,  MMM.  Cardiovascular: RRR, normal s1/s2, no murmur appreciated.  Respiratory: CTAB, normal effort  Abdomen: soft, nontender, no rebound or guarding. Bowel sounds present  Extremities: no edema/cyanosis  Skin: diffuse age-related atrophy, no erythematous rashes  Neuro: alert and oriented. CN grossly intact. Finger to nose with intention tremor; also does appear to have some postural tremor. No resting tremor noted. No pronator drift. Grip strength 5/5 bilaterally.  Laboratory:  Recent Labs Lab 10/04/13  1935  WBC 9.0  HGB 13.2  HCT 36.9  PLT 205    Recent Labs Lab 10/04/13 1935  NA 139  K 3.7  CL 97  CO2 28  BUN 24*  CREATININE 1.57*  CALCIUM 10.0  PROT 7.0  BILITOT <0.2*  ALKPHOS 53  ALT 12  AST 21  GLUCOSE 84   Mr Brain Wo Contrast  10/07/2013   CLINICAL DATA:  Stroke, worsening fatigue and  dizziness for 1 week.  EXAM: MRI HEAD WITHOUT CONTRAST  MRA HEAD WITHOUT CONTRAST  TECHNIQUE: Multiplanar, multiecho pulse sequences of the brain and surrounding structures were obtained without intravenous contrast. Angiographic images of the head were obtained using MRA technique without contrast.  COMPARISON:  CT HEAD W/O CM dated 10/04/2013; MRA Head w/o contrast dated 09/14/2010; MRI Brain w/wo contrast dated 09/14/2010  FINDINGS: MRI HEAD FINDINGS  No reduced diffusion to suggest acute ischemia. No susceptibility artifact to suggest hemorrhage.  The ventricles and sulci are normal for patient's age. A few sub cm scattered supratentorial white matter FLAIR T2 hyperintensities are present, minimally increased from prior examination, less than expected for age. No midline shift or mass effect.  No abnormal extra-axial fluid collections. Status post bilateral ocular lens implants. No paranasal sinus air-fluid levels. Mastoid air cells are well aerated. No abnormal sellar expansion. No cerebellar tonsillar ectopia. Included view of the cervical spine demonstrates C4-5 ACDF. Patient is edentulous.  MRA HEAD FINDINGS  Anterior circulation: Normal flow related enhancement of the included cervical, petrous, cavernous and supra clinoid internal carotid arteries. Patent anterior communicating artery. Normal flow related enhancement of the anterior and middle cerebral arteries, including more distal segments.  Posterior circulation: Codominant vertebral arteries. Basilar artery is patent, with normal flow related enhancement of the main branch vessels. Normal flow related enhancement of the posterior cerebral arteries.  No large vessel occlusion, hemodynamically significant stenosis, aneurysm within the anterior nor posterior circulation. There are mild luminal irregularity suggests intracranial atherosclerosis.  IMPRESSION: MRI of the head no acute intracranial process ; normal noncontrast MRI of the brain for age.  MRA head:  No hemodynamically significant stenosis; very mild luminal irregularity likely represents atherosclerosis.   Electronically Signed   By: Elon Alas   On: 10/07/2013 03:16   Mr Jodene Nam Head/brain Wo Cm  10/07/2013   CLINICAL DATA:  Stroke, worsening fatigue and dizziness for 1 week.  EXAM: MRI HEAD WITHOUT CONTRAST  MRA HEAD WITHOUT CONTRAST  TECHNIQUE: Multiplanar, multiecho pulse sequences of the brain and surrounding structures were obtained without intravenous contrast. Angiographic images of the head were obtained using MRA technique without contrast.  COMPARISON:  CT HEAD W/O CM dated 10/04/2013; MRA Head w/o contrast dated 09/14/2010; MRI Brain w/wo contrast dated 09/14/2010  FINDINGS: MRI HEAD FINDINGS  No reduced diffusion to suggest acute ischemia. No susceptibility artifact to suggest hemorrhage.  The ventricles and sulci are normal for patient's age. A few sub cm scattered supratentorial white matter FLAIR T2 hyperintensities are present, minimally increased from prior examination, less than expected for age. No midline shift or mass effect.  No abnormal extra-axial fluid collections. Status post bilateral ocular lens implants. No paranasal sinus air-fluid levels. Mastoid air cells are well aerated. No abnormal sellar expansion. No cerebellar tonsillar ectopia. Included view of the cervical spine demonstrates C4-5 ACDF. Patient is edentulous.  MRA HEAD FINDINGS  Anterior circulation: Normal flow related enhancement of the included cervical, petrous, cavernous and supra clinoid internal carotid arteries. Patent anterior communicating artery. Normal flow  related enhancement of the anterior and middle cerebral arteries, including more distal segments.  Posterior circulation: Codominant vertebral arteries. Basilar artery is patent, with normal flow related enhancement of the main branch vessels. Normal flow related enhancement of the posterior cerebral arteries.  No large vessel occlusion, hemodynamically  significant stenosis, aneurysm within the anterior nor posterior circulation. There are mild luminal irregularity suggests intracranial atherosclerosis.  IMPRESSION: MRI of the head no acute intracranial process ; normal noncontrast MRI of the brain for age.  MRA head: No hemodynamically significant stenosis; very mild luminal irregularity likely represents atherosclerosis.   Electronically Signed   By: Elon Alas   On: 10/07/2013 03:16     Tawanna Sat, MD 10/07/2013, 7:48 AM PGY-1, [place] Intern pager: [phone], text pages welcome

## 2013-10-07 NOTE — Progress Notes (Signed)
Physical Therapy Treatment Patient Details Name: [name] [name] MRN: [id number] DOB: [birth year] Today's Date: 10/07/2013    History of Present Illness 73 y.o. female presenting with fatigue and dizziness x 1 week. PMH is significant for HTN, PVD (s/p right leg stent), HLD, PMR, GERD, anxiety/depression.  CT and MRI of head (-) for acute events.  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     PT Comments    Pt continues to have symptoms of L peripheral hypofunctioning vestibular system.  She was given x1 gaze stability exercises and she would benefit from OP PT follow up for vestibular PT and balance training/compensation strategy reinforcement.  PT will continue to follow acutely for the deficits listed below.   Follow Up Recommendations  Outpatient PT (for vestibular therapy)     Equipment Recommendations  None recommended by PT    Recommendations for Other Services   NA      Precautions / Restrictions Precautions Precautions: Fall Precaution Comments: Assist when up, use RW.  multiple risk factors include dizziness, visual deficits, muscle weakness and pain.    Mobility  Bed Mobility Overal bed mobility: Needs Assistance Bed Mobility: Rolling;Supine to Sit;Sit to Supine Rolling: Supervision Sidelying to sit: Supervision Supine to sit: Supervision Sit to supine: Supervision Sit to sidelying: Supervision General bed mobility comments: supervision for safety due to bed mobility often elicits spinning.    Transfers Overall transfer level: Needs assistance Equipment used: Rolling walker (2 wheeled) Transfers: Sit to/from Stand Sit to Stand: Min assist Stand pivot transfers: Min assist       General transfer comment: Min assist to steady pt for balance.  If she doesn't concentrate and use her targets/moves slowly she very easily loses her balance and tips over.                   Balance Overall balance  assessment: Needs assistance Sitting-balance support: Feet supported Sitting balance-Leahy Scale: Good     Standing balance support: Bilateral upper extremity supported Standing balance-Leahy Scale: Poor                      Cognition Arousal/Alertness: Awake/alert Behavior During Therapy: WFL for tasks assessed/performed;Flat affect Overall Cognitive Status: Impaired/Different from baseline Area of Impairment: Memory     Memory: Decreased short-term memory         General Comments: May have some STM deficits as she keeps reporting to staff that her daughter is terminally ill and her son reports that his sister is having her hip replaced and as far as he knows is not terminally ill.      Exercises Other Exercises Other Exercises: x1 vertical and horizontal exercises.  Pt can handle ~10 seconds of each in each direction before becoming nauseated.      General Comments General comments (skin integrity, edema, etc.): Pt re tested for vestibluar pathology and findings continue to be consistant with a L sided peripherial hypofunction consistant with a labarynthitis.  x1 exercises both vertical and horizontal initiated, handout given and reviewed.  Compensation strategies also reviewed. Reinforeced compensation strategies during mobility as well (target finding, sequential turning).        Pertinent Vitals/Pain See vitals flow sheet.            PT Goals (current goals can now be found in the care plan section) Acute Rehab PT Goals Patient Stated Goal: to feel better Progress towards PT goals: Progressing toward goals  Frequency  Min 3X/week    PT Plan Discharge plan needs to be updated    End of Session Equipment Utilized During Treatment: Gait belt Activity Tolerance: Other (comment) (limited by nausea, headache and vertigo) Patient left: in chair;with call bell/phone within reach     Time: 1155-1226 PT Time Calculation (min): 31 min  Charges:   $Therapeutic Exercise: 8-22 mins $Neuromuscular Re-education: 8-22 mins                      Ferron Ishmael B. Ronda, [place], DPT [phone]   10/07/2013, 2:54 PM

## 2013-10-07 NOTE — Care Management Note (Signed)
    Page 1 of 1   10/07/2013     2:02:49 PM   CARE MANAGEMENT NOTE 10/07/2013  Patient:  [name] [name], [name] [name]   Account Number:  [id number]  Date Initiated:  10/07/2013  Documentation initiated by:  GRAVES-BIGELOW,Wilena Tyndall  Subjective/Objective Assessment:   Pt admitted for  fatigue and dizziness x 1 week. MRI revealed no acute abnormality.     Action/Plan:   Plan is for d/c home today with Outpatient Neuro Rehab Services. CM will fax information to Outpatient Neuro and they will call pt for f/u appointment.   Anticipated DC Date:  10/07/2013   Anticipated DC Plan:  [place]  CM consult      Choice offered to / List presented to:             Status of service:  Completed, signed off Medicare Important Message given?   (If response is "NO", the following Medicare IM given date fields will be blank) Date Medicare IM given:   Date Additional Medicare IM given:    Discharge Disposition:  HOME/SELF CARE  Per UR Regulation:  Reviewed for med. necessity/level of care/duration of stay  If discussed at [place] of Stay Meetings, dates discussed:    Comments:

## 2013-10-08 NOTE — Discharge Summary (Signed)
Attending Addendum  I examined the patient and discussed the discharge plan with Dr. Wight. I have reviewed the note and agree.    Annelise Mccoy, MD FAMILY MEDICINE TEACHING SERVICE   

## 2013-10-13 ENCOUNTER — Ambulatory Visit (INDEPENDENT_AMBULATORY_CARE_PROVIDER_SITE_OTHER): Payer: Commercial Managed Care - HMO | Admitting: Family Medicine

## 2013-10-13 ENCOUNTER — Encounter: Payer: Self-pay | Admitting: Family Medicine

## 2013-10-13 VITALS — BP 117/66 | HR 100 | Ht 60.0 in | Wt 146.7 lb

## 2013-10-13 DIAGNOSIS — I10 Essential (primary) hypertension: Secondary | ICD-10-CM

## 2013-10-13 DIAGNOSIS — R259 Unspecified abnormal involuntary movements: Secondary | ICD-10-CM | POA: Diagnosis not present

## 2013-10-13 DIAGNOSIS — G252 Other specified forms of tremor: Secondary | ICD-10-CM

## 2013-10-13 DIAGNOSIS — H9190 Unspecified hearing loss, unspecified ear: Secondary | ICD-10-CM | POA: Diagnosis not present

## 2013-10-13 DIAGNOSIS — R42 Dizziness and giddiness: Secondary | ICD-10-CM | POA: Diagnosis not present

## 2013-10-13 MED ORDER — CHLORTHALIDONE 25 MG PO TABS: 12.5000 mg | ORAL_TABLET | Freq: Every day | ORAL | Status: DC

## 2013-10-13 NOTE — Patient Instructions (Addendum)
Blood Pressure - decrease Chlorthalidone to 12.5 mg daily  Dizziness/Vertigo - ?orthostatic hypotension vs BPPV, go to vestibular rehab as scheduled on 10/17/13  Ringing in ears/hearing loss - you have been referred to an audiologist (you will be contacted in the next few days)  Keep appointment with Dr. Wendi Snipes

## 2013-10-14 ENCOUNTER — Telehealth: Payer: Self-pay | Admitting: Family Medicine

## 2013-10-14 DIAGNOSIS — R259 Unspecified abnormal involuntary movements: Secondary | ICD-10-CM

## 2013-10-14 DIAGNOSIS — G252 Other specified forms of tremor: Secondary | ICD-10-CM | POA: Insufficient documentation

## 2013-10-14 NOTE — Telephone Encounter (Signed)
Will fwd to MD for advice since patient was seen recently. Our normal office policy is pt needs to schedule appt with PCP to discuss.   [name] [name], [name], [place]

## 2013-10-14 NOTE — Assessment & Plan Note (Signed)
Blood pressure well controlled. -decreased Chlorthalidone to 12.5 mg daily as patient has symptoms that may be consistent with orthostatic hypotension

## 2013-10-14 NOTE — Assessment & Plan Note (Signed)
Hospital Follow up. Patient has resting tremor/bradykinesia/and dysmetria. Concern for Parkinson's disease. MRI/MRA negative for cerebellar stroke.  -patient to scheduled outpatient follow up with Neurology

## 2013-10-14 NOTE — Assessment & Plan Note (Signed)
Hospital Follow up from 10/07/13. Workup negative for CVA/mass. Differential includes BPPV (Dix Hallpike nonconclusive as dizzy with maneuver without nystagmus) vs Orthostatic Hypotension (symptoms consistent with OH however orthostatic BP normal) vs Labyrinthitis.  -patient to follow up with vestibular rehab -decreased dose of Chlorthalidone as blood pressure low today

## 2013-10-14 NOTE — Assessment & Plan Note (Signed)
Hearing loss bilaterally with tinnitus.  -Patient referred to audiology for further workup.

## 2013-10-14 NOTE — Progress Notes (Signed)
   Subjective:    Patient ID: [name] [name], female    DOB: 15-Apr-1941, 73 y.o.   MRN: [id number]  HPI 73 y/o female presents for hospital follow up. She was discharged from [facility] on 10/07/13. She was admitted for weakness/fatigue/possible TIA vs CVA. She had an MRI/MRA which was negative for acute stroke. Risk stratification labs were completed and as are outlined in the objective section. Neurology was consulted and thought that her symptoms may be due to early Parkinson's vs Tautopathy. Her daughter plans to call later today to scheduled follow up with Neurology.  Dizziness - patient reports continued dizziness, worst when standing from a seated position and with ambulation, she is asymptomatic at rest, lasts for a few seconds than resolves, she also reports symptoms when turning her head to the left while laying in bed, she was seen by PT in the hospital and is scheduled to follow up with vestibular rehab as outpatient, she also reports ringing in bilateral ears with associated hearing loss, does not wear hearing aides  Tremor/?Parkinsons - patient continues to have resting tremor in bilateral hands, she has difficulty with ambulation and requires the use of a walker  Fatigue - she continues to have generalized fatigue that is mildly improved since her discharge  FH - mother had Parkinson's disease  Review of Systems  Constitutional: Positive for fatigue. Negative for fever and chills.  Respiratory: Negative for cough and shortness of breath.   Cardiovascular: Negative for chest pain and leg swelling.  Neurological: Positive for dizziness, tremors, weakness and light-headedness. Negative for seizures, speech difficulty and numbness.       Objective:   Physical Exam Vitals: reviewed, orthostatics negative Gen: pleasant caucasian female, accompanied by her daughter, NAD HEENT: normocephalic, Bilateral TM pearly grey, PERRL, EOMI, no nystagmus however she become vertiginous when  looking to the left, no scleral icterus, nasal septum midline, MMM, uvula midline, neck supple, no lymphadenopathy Cardiac: RRR, S1 and S2 present, no murmurs, no heaves/thrills Resp: CTAB, normal effort Abd: soft, no tenderness, normal bowel sounds MSK: uses walker, slow movements, shuffling gait Neuro: CN 2-12 intact, resting pill rolling tremor in bilateral hands, strength 5/5 in all extremities, sensation to light touch grossly intact in all extremities, 2+ biceps/brachialradialis/patellar reflexes, no clonus, pronator drift negative, she fees off balance with rhomberg, Marye Round - patient dizzy with maneuver bilaterally however no nystagmus appreciated, patient's movements are very slow/bradykinetic  TSH 2.542 A1C 5.0 ESR 9 Lipid Panel not repeated (completed 06/2013) MRI/MRA brain unremarkable     Assessment & Plan:  Please see problem specific assessment and plan.

## 2013-10-14 NOTE — Telephone Encounter (Signed)
Patient requesting referral to neurologist

## 2013-10-15 ENCOUNTER — Telehealth: Payer: Self-pay | Admitting: Family Medicine

## 2013-10-15 DIAGNOSIS — R21 Rash and other nonspecific skin eruption: Secondary | ICD-10-CM

## 2013-10-15 NOTE — Telephone Encounter (Signed)
It is not a dermatology referral!   [place]

## 2013-10-15 NOTE — Telephone Encounter (Signed)
Spoke with patient's daughter.  Pt has always had a "strawberry" like birthmark on her scalp.  Pt is breaking out around that area and Dr.Bradshaw recommended she see her dermatologist for this.  [facility] in [place], [place], Dr.Carolyn Hollar is pt's dermatologist.  Pt's insurance requires a new referral.. Will fwd to PCP to place referral.  Lazaro Arms, CMA

## 2013-10-15 NOTE — Telephone Encounter (Signed)
Pt called because she was told by Dr. Wendi Snipes to see her dermatologist, but they said she has to have a referral to be seen. Can we put in the referral. jw

## 2013-10-16 NOTE — Addendum Note (Signed)
Addended by: Timmothy Euler on: 10/16/2013 06:51 AM   Modules accepted: Orders

## 2013-10-16 NOTE — Telephone Encounter (Addendum)
She dos have an established dermatologist, so I agree with her request if she needs a new referal.   Written Referral, Will ask staff to call.   Laroy Apple, MD [place] Resident, PGY-2 10/16/2013, 6:49 AM

## 2013-10-17 ENCOUNTER — Ambulatory Visit: Payer: Medicare HMO | Attending: Family Medicine | Admitting: Physical Therapy

## 2013-10-17 DIAGNOSIS — IMO0001 Reserved for inherently not codable concepts without codable children: Secondary | ICD-10-CM | POA: Insufficient documentation

## 2013-10-17 DIAGNOSIS — R42 Dizziness and giddiness: Secondary | ICD-10-CM | POA: Insufficient documentation

## 2013-10-17 DIAGNOSIS — R269 Unspecified abnormalities of gait and mobility: Secondary | ICD-10-CM | POA: Diagnosis not present

## 2013-10-22 ENCOUNTER — Telehealth: Payer: Self-pay | Admitting: Family Medicine

## 2013-10-22 ENCOUNTER — Encounter: Payer: Self-pay | Admitting: Family Medicine

## 2013-10-22 ENCOUNTER — Ambulatory Visit (INDEPENDENT_AMBULATORY_CARE_PROVIDER_SITE_OTHER): Payer: Medicare HMO | Admitting: Family Medicine

## 2013-10-22 VITALS — BP 116/76 | HR 89 | Temp 97.9°F | Ht 60.0 in | Wt 145.0 lb

## 2013-10-22 DIAGNOSIS — H524 Presbyopia: Secondary | ICD-10-CM

## 2013-10-22 DIAGNOSIS — F32A Depression, unspecified: Secondary | ICD-10-CM | POA: Insufficient documentation

## 2013-10-22 DIAGNOSIS — I10 Essential (primary) hypertension: Secondary | ICD-10-CM

## 2013-10-22 DIAGNOSIS — R42 Dizziness and giddiness: Secondary | ICD-10-CM

## 2013-10-22 DIAGNOSIS — F329 Major depressive disorder, single episode, unspecified: Secondary | ICD-10-CM | POA: Insufficient documentation

## 2013-10-22 DIAGNOSIS — F3289 Other specified depressive episodes: Secondary | ICD-10-CM

## 2013-10-22 MED ORDER — CLOPIDOGREL BISULFATE 75 MG PO TABS: 75.0000 mg | ORAL_TABLET | Freq: Every morning | ORAL | Status: DC

## 2013-10-22 NOTE — Patient Instructions (Signed)
It was great to see you today!  I'm glad your dizziness is getting better, keep doing the exercises.   Try your compression stockings for swelling, we can stop norvasc if need to but Id like to see if we can control it other ways to keep your BP good.   Come back in 1-2 months.

## 2013-10-22 NOTE — Telephone Encounter (Signed)
Will forward to PCP 

## 2013-10-22 NOTE — Assessment & Plan Note (Signed)
Controlled on zoloft, patient requesting referral to psychiatry who she is est with Agree with psych referral, referred No SI, Would gladly refill zoloft if needed.

## 2013-10-22 NOTE — Telephone Encounter (Signed)
Pt called to give Dr. Wendi Snipes the name of the eye doctor. Dr. Orlinda Blalock and the fax is [phone]. [name] [name]

## 2013-10-22 NOTE — Assessment & Plan Note (Signed)
Still doing well despite decrease in chlorthalidone Edema likely due to norvasc, will cont for now as she will try compression stockings and she has had drug reactions to several drugs per her report

## 2013-10-22 NOTE — Assessment & Plan Note (Signed)
>>  ASSESSMENT AND PLAN FOR DEPRESSION WRITTEN ON 10/22/2013  5:30 PM BY Leoma Raja, MD  Controlled on zoloft , patient requesting referral to psychiatry who she is est with Agree with psych referral, referred No SI, Would gladly refill zoloft  if needed.

## 2013-10-22 NOTE — Addendum Note (Signed)
Addended by: Timmothy Euler on: 10/22/2013 05:51 PM   Modules accepted: Orders

## 2013-10-22 NOTE — Assessment & Plan Note (Signed)
Improving with decreased BP med and vestibular rehab Will continue to follow Neuro referral would be reasonable considering suspicion of parkinsonism or tauopathy while IP.

## 2013-10-22 NOTE — Progress Notes (Addendum)
Patient ID: [name] [name], female   DOB: 01-06-1941, 73 y.o.   MRN: [id number]  Kenn File, MD Phone: [phone]  Subjective:  Chief complaint-noted  # Pt here for follow up dizziness and   BP  110-140s/60-80, She is takibg meds regular,  No headaches, chest pain, no palpitations, + exertional dyspnea,  New swelling in [place] for several months  Needs referrals to derm, psych, and ophtho but doesn't know what eye doctor yet so will call back.   LE edema is painful, she just got compression stockings but hasnt yused them yet.   She has been seeing psychiatry for a while for depression and really likes Dr. Eulas Post, she is takking zoloft, Denies SI  Her dizziness is improving with vestibular rehab and decreasing her BP meds.  Sent ophtho referral as well. - ROS- Per hPI  Past Medical History Patient Active Problem List   Diagnosis Date Noted  . Depression 10/22/2013  . Resting tremor 10/14/2013  . Hearing loss 10/13/2013  . Dizziness 10/05/2013  . Dysmetria 10/04/2013  . Low back pain 09/11/2013  . Polymyalgia rheumatica 09/11/2013  . GERD (gastroesophageal reflux disease) 09/11/2013  . Vulvar irritation 09/11/2013  . Non-healing ulcer 09/11/2013  . Rash and nonspecific skin eruption 07/28/2013  . Pulmonary nodules 05/24/2012  . DOE (dyspnea on exertion) 05/24/2012  . PVD (peripheral vascular disease) 04/30/2012  . Carotid bruit 04/30/2012  . Dyspnea 04/30/2012  . Hypertension 04/27/2012  . Chest pain 04/27/2012  . Anxiety and depression 04/27/2012  . Hyperlipemia 04/27/2012  . Cervical spondylosis with radiculopathy 04/19/2012    Medications- reviewed and updated Current Outpatient Prescriptions  Medication Sig Dispense Refill  . amLODipine (NORVASC) 5 MG tablet Take 2 tablets (10 mg total) by mouth daily.  30 tablet  11  . aspirin EC 81 MG tablet Take 81 mg by mouth every morning.      . chlorthalidone (HYGROTON) 25 MG tablet Take 0.5 tablets (12.5  mg total) by mouth daily.  30 tablet  3  . clopidogrel (PLAVIX) 75 MG tablet Take 1 tablet (75 mg total) by mouth every morning.  30 tablet  11  . cyclobenzaprine (FLEXERIL) 10 MG tablet Take 10 mg by mouth 3 (three) times daily as needed for muscle spasms.      . fenofibrate 160 MG tablet Take 1 tablet (160 mg total) by mouth daily.  90 tablet  3  . pantoprazole (PROTONIX) 40 MG tablet Take 1 tablet (40 mg total) by mouth daily.  30 tablet  5  . predniSONE (DELTASONE) 5 MG tablet Take 1 tablet (5 mg total) by mouth daily with breakfast.  30 tablet  2  . risedronate (ACTONEL) 150 MG tablet Take 150 mg by mouth every 30 (thirty) days. with water on empty stomach, nothing by mouth or lie down for next 30 minutes.      . sertraline (ZOLOFT) 100 MG tablet Take 150 mg by mouth every morning.       . traMADol (ULTRAM) 50 MG tablet Take 1 tablet (50 mg total) by mouth every 6 (six) hours as needed for moderate pain.  30 tablet  1  . traZODone (DESYREL) 50 MG tablet Take 50 mg by mouth at bedtime.        No current facility-administered medications for this visit.    Objective: BP 116/76  Pulse 89  Temp(Src) 97.9 F (36.6 C) (Oral)  Ht 5' (1.524 m)  Wt 145 lb (65.772 kg)  BMI 28.32 kg/m2  Gen: NAD, alert, cooperative with exam HEENT: NCAT CV: RRR, good S1/S2, no murmur Resp: CTABL, no wheezes, non-labored Ext: non pitting edema - trace Neuro: Alert and oriented, No gross deficits Psych- sdenies SI, appropriate affect   Assessment/Plan:  Depression Controlled on zoloft, patient requesting referral to psychiatry who she is est with Agree with psych referral, referred No SI, Would gladly refill zoloft if needed.   Dizziness Improving with decreased BP med and vestibular rehab Will continue to follow Neuro referral would be reasonable considering suspicion of parkinsonism or tauopathy while IP.   Hypertension Still doing well despite decrease in chlorthalidone Edema likely due to  norvasc, will cont for now as she will try compression stockings and she has had drug reactions to several drugs per her report     Orders Placed This Encounter  Procedures  . Ambulatory referral to Psychiatry    Referral Priority:  Routine    Referral Type:  Psychiatric    Referral Reason:  Specialty Services Required    Requested Specialty:  Psychiatry    Number of Visits Requested:  1    Meds ordered this encounter  Medications  . clopidogrel (PLAVIX) 75 MG tablet    Sig: Take 1 tablet (75 mg total) by mouth every morning.    Dispense:  30 tablet    Refill:  11

## 2013-10-24 ENCOUNTER — Ambulatory Visit: Payer: Medicare HMO | Admitting: Physical Therapy

## 2013-10-24 ENCOUNTER — Telehealth: Payer: Self-pay | Admitting: Family Medicine

## 2013-10-24 DIAGNOSIS — IMO0001 Reserved for inherently not codable concepts without codable children: Secondary | ICD-10-CM | POA: Diagnosis not present

## 2013-10-24 NOTE — Telephone Encounter (Signed)
LVM to pt   Pt needs to call to [facility] [phone]  to set up an appt.   [employer] authorized 4 visit, authorization was faxed to [facility].  Pt also have a psychiatry referral we do not process psychiatry referrals nurse should provide resource list.   [place]

## 2013-10-29 ENCOUNTER — Ambulatory Visit: Payer: Medicare HMO | Admitting: Physical Therapy

## 2013-10-29 DIAGNOSIS — IMO0001 Reserved for inherently not codable concepts without codable children: Secondary | ICD-10-CM | POA: Diagnosis not present

## 2013-10-29 NOTE — Telephone Encounter (Signed)
LVM to pt   Pt needs to call to [facility] [phone]  to set up an appt.   [employer] authorized 4 visit, authorization was faxed to [facility].   [place]

## 2013-10-31 ENCOUNTER — Ambulatory Visit: Payer: Medicare HMO | Admitting: Physical Therapy

## 2013-10-31 DIAGNOSIS — IMO0001 Reserved for inherently not codable concepts without codable children: Secondary | ICD-10-CM | POA: Diagnosis not present

## 2013-11-04 ENCOUNTER — Ambulatory Visit: Payer: Medicare HMO | Admitting: Physical Therapy

## 2013-11-04 DIAGNOSIS — IMO0001 Reserved for inherently not codable concepts without codable children: Secondary | ICD-10-CM | POA: Diagnosis not present

## 2013-11-12 ENCOUNTER — Telehealth: Payer: Self-pay | Admitting: Family Medicine

## 2013-11-12 DIAGNOSIS — C4441 Basal cell carcinoma of skin of scalp and neck: Secondary | ICD-10-CM | POA: Insufficient documentation

## 2013-11-12 DIAGNOSIS — C4491 Basal cell carcinoma of skin, unspecified: Secondary | ICD-10-CM

## 2013-11-12 NOTE — Telephone Encounter (Signed)
Return to the patient's daughter's call. She's on the phone with her dermatologist right now who says that her mother has basal cell carcinoma and she'll need excision. She states that she doesn't have any questions at this time, I asked that she call me and let me know she does.  She is currently scheduling excision  Laroy Apple, MD [place] Resident, PGY-2 11/12/2013, 5:15 PM

## 2013-11-12 NOTE — Telephone Encounter (Signed)
Patient's daughter calls, patient told her that the MD from [facility] called her and told her that the biopsy she had done came back as cancer. She is wanting to verify these findings with Dr. Wendi Snipes, as patient sometimes exaggerates things.  Please call [name] at [phone]

## 2013-11-12 NOTE — Telephone Encounter (Signed)
Will fwd to PCP for review. Thanks. [name] [name]

## 2013-11-18 ENCOUNTER — Ambulatory Visit: Payer: Medicare HMO | Attending: Family Medicine | Admitting: Audiology

## 2013-11-18 DIAGNOSIS — IMO0001 Reserved for inherently not codable concepts without codable children: Secondary | ICD-10-CM | POA: Insufficient documentation

## 2013-11-18 DIAGNOSIS — R42 Dizziness and giddiness: Secondary | ICD-10-CM | POA: Insufficient documentation

## 2013-11-18 DIAGNOSIS — R269 Unspecified abnormalities of gait and mobility: Secondary | ICD-10-CM | POA: Insufficient documentation

## 2013-11-25 ENCOUNTER — Telehealth: Payer: Self-pay | Admitting: Family Medicine

## 2013-11-25 DIAGNOSIS — C4490 Unspecified malignant neoplasm of skin, unspecified: Secondary | ICD-10-CM

## 2013-11-25 NOTE — Telephone Encounter (Signed)
Needs a referral sent to [facility] on [address]. She is having surgery on June 11.

## 2013-11-25 NOTE — Telephone Encounter (Signed)
Referral made for skin surgery center as she has Bx proven skin cancer and needs it removed.   Laroy Apple, MD [place] Resident, PGY-2 11/25/2013, 5:06 PM

## 2013-11-26 ENCOUNTER — Encounter: Payer: Self-pay | Admitting: Family Medicine

## 2013-11-29 ENCOUNTER — Other Ambulatory Visit: Payer: Self-pay | Admitting: Family Medicine

## 2013-12-09 ENCOUNTER — Encounter: Payer: Self-pay | Admitting: Family Medicine

## 2013-12-16 ENCOUNTER — Telehealth: Payer: Self-pay | Admitting: *Deleted

## 2013-12-16 ENCOUNTER — Ambulatory Visit: Payer: Medicare HMO | Attending: Audiology | Admitting: Audiology

## 2013-12-16 DIAGNOSIS — R42 Dizziness and giddiness: Secondary | ICD-10-CM | POA: Insufficient documentation

## 2013-12-16 DIAGNOSIS — R269 Unspecified abnormalities of gait and mobility: Secondary | ICD-10-CM | POA: Insufficient documentation

## 2013-12-16 DIAGNOSIS — IMO0001 Reserved for inherently not codable concepts without codable children: Secondary | ICD-10-CM | POA: Diagnosis not present

## 2013-12-16 DIAGNOSIS — H93299 Other abnormal auditory perceptions, unspecified ear: Secondary | ICD-10-CM

## 2013-12-16 DIAGNOSIS — H9202 Otalgia, left ear: Secondary | ICD-10-CM

## 2013-12-16 DIAGNOSIS — H903 Sensorineural hearing loss, bilateral: Secondary | ICD-10-CM

## 2013-12-16 NOTE — Patient Instructions (Signed)
[name] [name] has a moderate to severe sensorineural hearing loss bilaterally that is worse on the left side.  Her word recognition is good on the right side and poor on the left side.  She has significant recruitment bilaterally.    Of concern is that today [name] [name] has swelling on the left side of her head that includes left mandibular area, she feels that her left eye has pressure and "is too big".  Otoscopic inspection shows a swelling on the floor to anterior portion of the mid ear canal.  Recommendations: 1) Follow-up with Dr. Wendi Snipes on Thursday 8:40am. 2) Further evaluation by an ENT for asymetrical hearing loss and word recognition. 3)  Use walking stability measures such as a cane - [name] [name] may be at high risk for fall since she states vertigo "comes and goes".  4)  Closely monitor hearing to determine whether left ear hearing is fluctuating or progressive.  [name] [name], Au.D., CCC-A Doctor of Audiology 12/16/2013  .

## 2013-12-16 NOTE — Procedures (Signed)
[facility] [address] [place], [place] [address] [place] EVALUATION  Name: [name] [name] DOB:  [birth year] MRN:  [id number]                                 Diagnosis: hearing loss, tinnitus Date: 12/16/2013    Referent: Kenn File, MD  HISTORY: [name] [name], age 73 y.o. years, was seen for an audiological evaluation.  She states that she "had her hearing testing in 2007" but that since then she has noticed "a gradual hearing loss on the left side". [name] [name] reports constant "ringing" and  states that about "twice a month" she has "fullness in her ear -especially the left one" with "left ear pain in the canal, on her left face and eye socket".  She reports having difficulty hearing people in background noise.   Significant is that "about two months ago" she was "admitted for vertigo and found to have BPPV".  She was treated and "has been better until recently when the left ear pain started".  She reports vertigo last evening but "can stop the sensation by closing her eyes".  EVALUATION: Pure tone air and bone conduction was completed using conventional audiometry with earphones -inserts were not used because of the left ear canal discomfort. Hearing thresholds were 35-40 dBHL from 250Hz  - 500Hz ; the poorer left ear high frequency hearing thresholds are from 45 dBHL at 1000Hz  to 70 dBHL at 4000Hz  - 8000Hz .  The right ear high frequency hearing thresholds are 30-35 dBHL at 1000Hz -2000Hz  sloping to 60-75 dBHL at 4000Hz  - 8000Hz .  The hearing loss is sensorineural bilaterally with recruitment. Speech detection is 35 dBHL in the right ear and 50 dBHL in the left ear using recorded spondee words.  The reliability is good. Word recognition is 86% at 75dBHL in the right and 42% at 90dBHL (70 dBHL contralateral masking) in the left using recorded NU-6 word lists in quiet.  Otoscopic inspection reveals clear ear canals with visible tympanic  membranes. However, there is a visible swelling on the anterior and inferior mid portion of the canal that is slightly pink. [name] [name] also has several swollen lymph nodes on the left mandibular area and left upper neck without redness or warmth to the touch.  When uncomfortable loudness levels were measures, she reported "shooting pain to the left side of the face - including the eye socket area".    Tympanometry showed in the right ear was within normal limits for pressure and volume (Type A). The left ear was not tested because of the swelling and discomfort.  CONCLUSION:  [name] [name] has a moderate to severe sensorineural hearing loss bilaterally that is worse on the left side.  Her word recognition is good on the right side and poor on the left side, in quiet.  She has significant recruitment bilaterally.  Further testing was not completed because it was uncomfortable for her even using earphones rather than ear inserts.  Of concern is that today [name] [name] has swelling in her left ear canal and on the left side of her face that includes left mandibular and facial area.  Dr. Alen Bleacher office was contacted because [name] [name] says that "they have been trying to catch the swelling when it happens for some time".  Recommendations: 1) Follow-up with Dr. Wendi Snipes on Thursday 8:40am. 2) Further evaluation by an ENT for asymetrical hearing loss and word  recognition and left sided ear/facial pain with a hearing aid evaluation once [name] [name] has medical clearance. 3)  Use walking stability measures such as a cane - [name] [name] may be at high risk for fall since she states vertigo "comes and goes" and she had it last evening.  4)  Closely monitor hearing to determine whether left ear hearing is fluctuating or progressive with a repeat audiological evaluation in 2-3 months. This evaluation may be completed here or at the ENT office. 5) Reevaluate for BPPV since [name] [name] has reported some symptoms  returning.  Deborah L. Heide Spark, Au.D., CCC-A Doctor of Audiology 12/16/2013

## 2013-12-16 NOTE — Telephone Encounter (Signed)
Received voice message from Dr. Heide Spark stating pt left ear and lymph nodes (throat) area is swollen.  Dr. Heide Spark was going to do a hearing test today.  Please give her a call at [phone].  Pt needs an appt with provider.  Left voice message for pt to call for an appt.  Derl Barrow, RN

## 2013-12-18 ENCOUNTER — Ambulatory Visit (INDEPENDENT_AMBULATORY_CARE_PROVIDER_SITE_OTHER): Payer: Commercial Managed Care - HMO | Admitting: Family Medicine

## 2013-12-18 ENCOUNTER — Telehealth: Payer: Self-pay | Admitting: Family Medicine

## 2013-12-18 ENCOUNTER — Encounter: Payer: Self-pay | Admitting: Family Medicine

## 2013-12-18 VITALS — BP 140/81 | HR 88 | Temp 97.7°F | Ht 60.0 in | Wt 143.0 lb

## 2013-12-18 DIAGNOSIS — G500 Trigeminal neuralgia: Secondary | ICD-10-CM

## 2013-12-18 LAB — POCT SEDIMENTATION RATE: POCT SED RATE: 39 mm/hr — AB (ref 0–22)

## 2013-12-18 MED ORDER — PREGABALIN 25 MG PO CAPS: 25.0000 mg | ORAL_CAPSULE | Freq: Two times a day (BID) | ORAL | Status: DC

## 2013-12-18 NOTE — Progress Notes (Signed)
Patient ID: [name] [name], female   DOB: 08-01-1940, 73 y.o.   MRN: [id number]    Subjective: HPI: Patient is a 73 y.o. female presenting to clinic today for left ear pain x4 days.  Ear Pain Patient with complex medical history complains of ear pain and possible ear infection. Symptoms include left ear pain, plugged sensation in the left ear and swollen glands. She is very tender on the entire left side of face. Onset of symptoms was 4 days ago, and have been unchanged since that time. Associated symptoms include: swelling of the face/ear and decreased hearing. Patient denies: chills and fever . She is drinking plenty of fluids. Went to audiology 2 days ago, hearing decreased to 40% in the left ear and reported swelling of canal. Patient has had similar symptoms in the past which spontaneously resolves. She states it feels like her jaw is trying to slide when she opens mouth; no popping or clicking. She has some intermittent dizziness as well.  History Reviewed: Non smoker  ROS: Please see HPI above.  Objective: Office vital signs reviewed. BP 140/81  Pulse 88  Temp(Src) 97.7 F (36.5 C) (Oral)  Ht 5' (1.524 m)  Wt 143 lb (64.864 kg)  BMI 27.93 kg/m2  Physical Examination:  General: Awake, alert. NAD HEENT: Atraumatic, normocephalic. Unable to appreciate swelling or redness. No trismus, no pops of jaw. No temporal artery tenderness or prominence.TM wnl bilaterally. ?swelling of floor of left canal although not much. Extremely tender to light touch of ear, jaw, neck and cheek.  Neck: No masses palpated. If any LAD, very minimal. Pulm: CTAB, no wheezes Cardio: RRR, no murmurs appreciated. Right carotid bruit Extremities: No edema Neuro: CN intact no nystagmus. Gait unsteady and walks with cane.  Assessment: 73 y.o. female same day appointment  Plan: See Problem List and After Visit Summary

## 2013-12-18 NOTE — Telephone Encounter (Signed)
Pt called because she is already taking Zoloft and now she has been prescribed Lyrica and she was reading the side effects of the Lyrica and is not sure she should be taking this while on Zoloft. Please call her because she is not going to take the Lyrica until someone calls her. jw

## 2013-12-18 NOTE — Assessment & Plan Note (Signed)
A: Given the paraesthesia and distribution, most likely nerve related. TM wnl, no signs of infection. No swelling appreciated.  DDx includes temporal arteritis, tumor, shingles, TMJ, but most likely due to trigeminal neuralgia.  P: - Sed rate was 39. Elevated, but not super high. Temporal arteritis less likely due to exam but in differential. Consider repeating sed rate if not improved. - Con't Tramadol - Start Lyrica low dose (25mg ) qhs - F/u with PCP next week.  - Discussed with patient about ENT referral. At this time, she would like to try current plan but if not improved, will go to ENT.

## 2013-12-18 NOTE — Patient Instructions (Signed)
Take the Lyrica at night time. I will let you know if your sed rate is high.  Follow up with Dr. Wendi Snipes as scheduled. I hope you feel better  Thanks! Deadrian Toya M. Elpidio Thielen, M.D.   Trigeminal Neuralgia Trigeminal neuralgia is a nerve disorder that causes sudden attacks of severe facial pain. It is caused by damage to the trigeminal nerve, a major nerve in the face. It is more common in women and in the elderly, although it can also happen in younger patients. Attacks last from a few seconds to several minutes and can occur from a couple of times per year to several times per day. Trigeminal neuralgia can be a very distressing and disabling condition. Surgery may be needed in very severe cases if medical treatment does not give relief. HOME CARE INSTRUCTIONS   If your caregiver prescribed medication to help prevent attacks, take as directed.  To help prevent attacks:  Chew on the unaffected side of the mouth.  Avoid touching your face.  Avoid blasts of hot or cold air.  Men may wish to grow a beard to avoid having to shave. SEEK IMMEDIATE MEDICAL CARE IF:  Pain is unbearable and your medicine does not help.  You develop new, unexplained symptoms (problems).  You have problems that may be related to a medication you are taking. Document Released: 06/23/2000 Document Revised: 09/18/2011 Document Reviewed: 04/23/2009 [facility] Patient Information 2014 [place], [place].

## 2013-12-19 NOTE — Telephone Encounter (Signed)
Spoke with patient and gave her message from MD, she expresses understanding and states she will only take the medication at bedtime.

## 2013-12-19 NOTE — Telephone Encounter (Signed)
Will forward to MD who prescribed for guidance on what to tell patient.

## 2013-12-19 NOTE — Telephone Encounter (Signed)
The only interaction based on literature review is it can make psychomotor side effects more prominent (such as slow thinking, slow moving, more tired.) That is why I have encouraged her to take it at nighttime.  As always, it is her choice to take the medication. If she is not comfortable taking it, that is ok with me. I prescribed it to help with her face pain but it will likely resolve without the Lyrica.  Thanks, [employer]. Blessed Cotham, M.D.

## 2013-12-23 ENCOUNTER — Encounter: Payer: Self-pay | Admitting: Family Medicine

## 2013-12-23 ENCOUNTER — Ambulatory Visit (INDEPENDENT_AMBULATORY_CARE_PROVIDER_SITE_OTHER): Payer: Commercial Managed Care - HMO | Admitting: Family Medicine

## 2013-12-23 VITALS — BP 145/69 | HR 105 | Ht 60.0 in | Wt 145.3 lb

## 2013-12-23 DIAGNOSIS — R0982 Postnasal drip: Secondary | ICD-10-CM | POA: Insufficient documentation

## 2013-12-23 DIAGNOSIS — C4491 Basal cell carcinoma of skin, unspecified: Secondary | ICD-10-CM

## 2013-12-23 DIAGNOSIS — I10 Essential (primary) hypertension: Secondary | ICD-10-CM

## 2013-12-23 DIAGNOSIS — M353 Polymyalgia rheumatica: Secondary | ICD-10-CM

## 2013-12-23 DIAGNOSIS — G500 Trigeminal neuralgia: Secondary | ICD-10-CM

## 2013-12-23 MED ORDER — AMLODIPINE BESYLATE 5 MG PO TABS: 10.0000 mg | ORAL_TABLET | Freq: Every day | ORAL | Status: DC

## 2013-12-23 MED ORDER — PREDNISONE 1 MG PO TABS: 2.0000 mg | ORAL_TABLET | Freq: Every day | ORAL | Status: DC

## 2013-12-23 MED ORDER — CHLORTHALIDONE 25 MG PO TABS: 12.5000 mg | ORAL_TABLET | Freq: Every day | ORAL | Status: DC

## 2013-12-23 MED ORDER — TRAMADOL HCL 50 MG PO TABS: 50.0000 mg | ORAL_TABLET | Freq: Four times a day (QID) | ORAL | Status: DC | PRN

## 2013-12-23 MED ORDER — SERTRALINE HCL 100 MG PO TABS: 150.0000 mg | ORAL_TABLET | Freq: Every morning | ORAL | Status: DC

## 2013-12-23 MED ORDER — CLOPIDOGREL BISULFATE 75 MG PO TABS: 75.0000 mg | ORAL_TABLET | Freq: Every morning | ORAL | Status: DC

## 2013-12-23 MED ORDER — FLUTICASONE PROPIONATE 50 MCG/ACT NA SUSP: 2.0000 | Freq: Every day | NASAL | Status: DC

## 2013-12-23 MED ORDER — GABAPENTIN 100 MG PO CAPS: 100.0000 mg | ORAL_CAPSULE | Freq: Three times a day (TID) | ORAL | Status: DC

## 2013-12-23 MED ORDER — PANTOPRAZOLE SODIUM 40 MG PO TBEC: 40.0000 mg | DELAYED_RELEASE_TABLET | Freq: Every day | ORAL | Status: DC

## 2013-12-23 MED ORDER — CYCLOBENZAPRINE HCL 10 MG PO TABS: 10.0000 mg | ORAL_TABLET | Freq: Three times a day (TID) | ORAL | Status: DC | PRN

## 2013-12-23 NOTE — Assessment & Plan Note (Signed)
Scheduled for Mohs surgery, followup with dermatology to

## 2013-12-23 NOTE — Assessment & Plan Note (Signed)
I agree with Dr. Darnelle Going assessment, this is most likely trigeminal neuralgia Patient did not tolerate Lyrica, also did not like the cost Start 100 mg gabapentin each bedtime, discussed methods for increasing dosed to a maximum of 2700 mg daily. No neurologic deficits or other symptoms to suggest CVA.

## 2013-12-23 NOTE — Patient Instructions (Addendum)
Great to see you!  Start taking the gabapentin 1 pill at night, you can take up to 9 pills 3 times  Start the flonase  Come back in 2 months

## 2013-12-23 NOTE — Assessment & Plan Note (Signed)
On chronic steroids, would like to decrease dose Decrease to 4 mg for a month Then decrease to 2 mg for a month then 1 mg for one month then stop. Followup in 2 months, discussed the patient reason to continue steroids and to return.

## 2013-12-23 NOTE — Assessment & Plan Note (Signed)
Only slightly elevated today Patient only taking amlodipine at home, encouraged her to start taking 12.5 mg chlorthalidone again No red flags Followup in 3-6 months.

## 2013-12-23 NOTE — Progress Notes (Signed)
Patient ID: [name] [name], female   DOB: [birth year], 73 y.o.   MRN: [id number]  Kenn File, MD Phone: [phone]  Subjective:  Chief complaint-noted  Pt Here for hypertension, trigeminal neuralgia, and refills  Hypertension Checks blood pressure every other day states was running in the 140s and like to increase her blood pressure medication. Taking her amlodipine Has not been taking chlorthalidone Denies chest pain and worsening dyspnea, has stable exertional dyspnea  Trigeminal neuralgia Pain has continued on her left face, tried Lyrica but felt very drowsy, she also states that she would not like to take this due to cost. Previously she was "allergic" to gabapentin due to a suspicion of a drug reaction, this was never confirmed, she would like to try again. She states the pain as an electric-like/shock like momentary pain radiating from her left ear across her left face. It does not bother her everyday. She denies dysarthria, problems walking, problems with balance, or problems moving her face  She like her medicines refilled for 3 months supplies. Polymyalgia rheumatica Previously started on prednisone for this by rheumatologist, would like to get off of prednisone. Has been stable on 5 mg for some time. Was initially on 6 mg and tolerated decrease to 5 mg without problems. Has 4 mg pills at home Would like to reduce to nothing if she can tolerate it.  Nasal cell carcinoma of the scalp Seen a dermatologist for this and hasn't scheduled for Mohs surgery.  ROS-  Per history of present illness  Past Medical History Patient Active Problem List   Diagnosis Date Noted  . PND (post-nasal drip) 12/23/2013  . Trigeminal neuralgia 12/18/2013  . Ear pain, left 12/16/2013  . Basal cell carcinoma 11/12/2013  . Depression 10/22/2013  . Presbyopia 10/22/2013  . Resting tremor 10/14/2013  . Hearing loss 10/13/2013  . Dizziness 10/05/2013  . Dysmetria 10/04/2013  . Low back pain  09/11/2013  . Polymyalgia rheumatica 09/11/2013  . GERD (gastroesophageal reflux disease) 09/11/2013  . Vulvar irritation 09/11/2013  . Non-healing ulcer 09/11/2013  . Rash and nonspecific skin eruption 07/28/2013  . Pulmonary nodules 05/24/2012  . DOE (dyspnea on exertion) 05/24/2012  . PVD (peripheral vascular disease) 04/30/2012  . Carotid bruit 04/30/2012  . Dyspnea 04/30/2012  . Hypertension 04/27/2012  . Chest pain 04/27/2012  . Anxiety and depression 04/27/2012  . Hyperlipemia 04/27/2012  . Cervical spondylosis with radiculopathy 04/19/2012    Medications- reviewed and updated Current Outpatient Prescriptions  Medication Sig Dispense Refill  . amLODipine (NORVASC) 5 MG tablet Take 2 tablets (10 mg total) by mouth daily.  90 tablet  3  . aspirin EC 81 MG tablet Take 81 mg by mouth every morning.      . clopidogrel (PLAVIX) 75 MG tablet Take 1 tablet (75 mg total) by mouth every morning.  90 tablet  3  . cyclobenzaprine (FLEXERIL) 10 MG tablet Take 1 tablet (10 mg total) by mouth 3 (three) times daily as needed for muscle spasms.  90 tablet  3  . fenofibrate 160 MG tablet Take 1 tablet (160 mg total) by mouth daily.  90 tablet  3  . pantoprazole (PROTONIX) 40 MG tablet Take 1 tablet (40 mg total) by mouth daily.  90 tablet  3  . risedronate (ACTONEL) 150 MG tablet Take 150 mg by mouth every 30 (thirty) days. with water on empty stomach, nothing by mouth or lie down for next 30 minutes.      . sertraline (ZOLOFT) 100 MG  tablet Take 1.5 tablets (150 mg total) by mouth every morning.  135 tablet  3  . traMADol (ULTRAM) 50 MG tablet Take 1 tablet (50 mg total) by mouth every 6 (six) hours as needed for moderate pain.  270 tablet  3  . chlorthalidone (HYGROTON) 25 MG tablet Take 0.5 tablets (12.5 mg total) by mouth daily.  90 tablet  3  . fluticasone (FLONASE) 50 MCG/ACT nasal spray Place 2 sprays into both nostrils daily.  16 g  11  . gabapentin (NEURONTIN) 100 MG capsule Take 1  capsule (100 mg total) by mouth 3 (three) times daily.  90 capsule  3  . predniSONE (DELTASONE) 1 MG tablet Take 2 tablets (2 mg total) by mouth daily with breakfast. For 1 month then 1 pill daily for one month then stop  90 tablet  0   No current facility-administered medications for this visit.    Objective: BP 145/69  Pulse 105  Ht 5' (1.524 m)  Wt 145 lb 4.8 oz (65.908 kg)  BMI 28.38 kg/m2 Gen: NAD, alert, cooperative with exam HEENT: NCAT, symmetric smile CV: RRR, good S1/S2, no murmur Resp: CTABL, no wheezes, non-labored Ext: No edema, warm Neuro: Alert and oriented, No gross deficits , altered sensation on the left face in V1, V2, V3 distribution, also altered sensation in buccal mucosa on the left. Normal/symmetric smile, normal speech, normal gait Skin: Actinic keratosis x2 on left arm, sebaceous hyperplasia of right nasolabial fold, diffuse changes consistent with sun damaged skin on bilateral arms, chest, and face.  Assessment/Plan:  Hypertension Only slightly elevated today Patient only taking amlodipine at home, encouraged her to start taking 12.5 mg chlorthalidone again No red flags Followup in 3-6 months.  Trigeminal neuralgia I agree with Dr. Darnelle Going assessment, this is most likely trigeminal neuralgia Patient did not tolerate Lyrica, also did not like the cost Start 100 mg gabapentin each bedtime, discussed methods for increasing dosed to a maximum of 2700 mg daily. No neurologic deficits or other symptoms to suggest CVA.  Basal cell carcinoma Scheduled for Mohs surgery, followup with dermatology to  Polymyalgia rheumatica On chronic steroids, would like to decrease dose Decrease to 4 mg for a month Then decrease to 2 mg for a month then 1 mg for one month then stop. Followup in 2 months, discussed the patient reason to continue steroids and to return.    Meds ordered this encounter  Medications  . gabapentin (NEURONTIN) 100 MG capsule    Sig: Take  1 capsule (100 mg total) by mouth 3 (three) times daily.    Dispense:  90 capsule    Refill:  3  . fluticasone (FLONASE) 50 MCG/ACT nasal spray    Sig: Place 2 sprays into both nostrils daily.    Dispense:  16 g    Refill:  11  . amLODipine (NORVASC) 5 MG tablet    Sig: Take 2 tablets (10 mg total) by mouth daily.    Dispense:  90 tablet    Refill:  3  . chlorthalidone (HYGROTON) 25 MG tablet    Sig: Take 0.5 tablets (12.5 mg total) by mouth daily.    Dispense:  90 tablet    Refill:  3  . clopidogrel (PLAVIX) 75 MG tablet    Sig: Take 1 tablet (75 mg total) by mouth every morning.    Dispense:  90 tablet    Refill:  3  . cyclobenzaprine (FLEXERIL) 10 MG tablet    Sig: Take 1  tablet (10 mg total) by mouth 3 (three) times daily as needed for muscle spasms.    Dispense:  90 tablet    Refill:  3  . pantoprazole (PROTONIX) 40 MG tablet    Sig: Take 1 tablet (40 mg total) by mouth daily.    Dispense:  90 tablet    Refill:  3  . predniSONE (DELTASONE) 1 MG tablet    Sig: Take 2 tablets (2 mg total) by mouth daily with breakfast. For 1 month then 1 pill daily for one month then stop    Dispense:  90 tablet    Refill:  0  . sertraline (ZOLOFT) 100 MG tablet    Sig: Take 1.5 tablets (150 mg total) by mouth every morning.    Dispense:  135 tablet    Refill:  3  . traMADol (ULTRAM) 50 MG tablet    Sig: Take 1 tablet (50 mg total) by mouth every 6 (six) hours as needed for moderate pain.    Dispense:  270 tablet    Refill:  3

## 2014-01-05 ENCOUNTER — Telehealth: Payer: Self-pay | Admitting: Family Medicine

## 2014-01-05 DIAGNOSIS — H409 Unspecified glaucoma: Secondary | ICD-10-CM

## 2014-01-05 NOTE — Telephone Encounter (Addendum)
Daughter inquired about forms that were sent to provider's office for completion in order for patient to see opthalmologist with Dr. Nehemiah Massed at [facility].  Pt need to be seen for glaucoma.  Pt scheduled on Friday so referral need to be put in today.

## 2014-01-08 ENCOUNTER — Telehealth: Payer: Self-pay | Admitting: *Deleted

## 2014-01-08 DIAGNOSIS — H409 Unspecified glaucoma: Secondary | ICD-10-CM | POA: Insufficient documentation

## 2014-01-08 DIAGNOSIS — H401133 Primary open-angle glaucoma, bilateral, severe stage: Secondary | ICD-10-CM | POA: Insufficient documentation

## 2014-01-08 NOTE — Telephone Encounter (Signed)
Forward to PCP for referral.[name], Kevin Fenton

## 2014-01-08 NOTE — Telephone Encounter (Signed)
Patient is agreeable. Please schedule appt at [facility].

## 2014-01-08 NOTE — Telephone Encounter (Signed)
Spoke with [employer], the [facility] is not in network therefore her insurance would not cover her to go there.   I have cancelled the appt At the [facility].       [place] asking pt to call back.  Would she be interested in scheduling @ [facility] (they are the preferred location with [facility]). Fleeger, Salome Spotted

## 2014-01-08 NOTE — Telephone Encounter (Signed)
Faxed referral to Dr Alveria Apley office, patient already has an appointment for tomorrow.[name], [name] [name]

## 2014-01-08 NOTE — Telephone Encounter (Signed)
ophtho referral written. Will ask team to notify.   Laroy Apple, MD [place] Resident, PGY-3 01/08/2014, 1:53 PM

## 2014-01-12 NOTE — Telephone Encounter (Signed)
refaxed to [facility] requesting Dr. [facility]. [name] [name], [name] [name]

## 2014-01-16 ENCOUNTER — Encounter: Payer: Self-pay | Admitting: Family Medicine

## 2014-01-16 ENCOUNTER — Ambulatory Visit (INDEPENDENT_AMBULATORY_CARE_PROVIDER_SITE_OTHER): Payer: Commercial Managed Care - HMO | Admitting: Family Medicine

## 2014-01-16 VITALS — BP 116/76 | HR 92 | Temp 97.6°F | Ht 60.0 in | Wt 146.2 lb

## 2014-01-16 DIAGNOSIS — G500 Trigeminal neuralgia: Secondary | ICD-10-CM

## 2014-01-16 NOTE — Patient Instructions (Signed)
I am sorry you are still hurting. You can increase gabapentin to 200mg  at nighttime, and slowly increase by one more pill at another time of day if symptoms are severe. Follow up with Dr Wendi Snipes if symptoms are not improving, in 3-5 days. If you have ear swelling again, follow up immediately so we can examine this, as it does not quite fit the picture with Trigeminal Neuralgia. If you have fevers, chills, shortness of breath, vision loss, or other concerns, seek immediate care. Continue to do your BPPV exercises.  Best,  Hilton Sinclair, MD

## 2014-01-18 NOTE — Assessment & Plan Note (Signed)
The most likely diagnosis is painful trigeminal neuralgia, due to distribution of pain and improvement with gabapentin. Currently in a pain flare for the past 2 days. I have considered and concluded the patient has a very low likelihood of having: Thyroid tumor, Acoustic neuroma, Myocardial infarction Temporal arteritis, Cholesteotoma, Tumors (ear, nasopharyngeal), Thoracic aneurysm. She has no chest pain, dyspnea, eye redness, fevers, chills, enlarged temporal artery (and ESR was only 39 on recent check), or vision changes.  - Vision test today to get baseline - Pt has visit with ophthalmologist in near future. - Continue gabapentin and increase nighttime dose to 271m. May increase another time of day to 2057mand discuss with PCP for further recommendations on increasing in pt with Cr 1.57. - With weight loss, would also send to ENT for further eval if persists at f/u. - Return immediately next appearance of ear swelling, as this is not characteristic of TGN but not present today so I could not examine further. - Continue BPPV exercises. - Return precautions reviewed.

## 2014-01-18 NOTE — Progress Notes (Signed)
Patient ID: [name] [name], female   DOB: 11-17-1940, 73 y.o.   MRN: [id number] Subjective:   CC: Ear swelling and discomfort  HPI:   EAR PAIN  Location: Left face from ear down to jaw and up through cheek to "behind my eye." Ear pain started: 2 days ago; has prior h/o this though for which she has been seeing PCP. Pain is: constant and throbbing Severity: Moderate (can go from 0 to 8) Medications tried: gabapentin 132m TID, which helps.  Recent ear trauma: yes Prior ear surgeries: No Antibiotics in the last 30 days: No History of diabetes: No  Symptoms Ear discharge: No Fever: No Pain with chewing: Yes Ringing in ears: Yes Dizziness: Yes Hearing loss: Yes Rashes or blisters around ear: No Weight loss: Yes  Review of Symptoms - see HPI. Additionally, no neck stiffness, tooth pain, or neurologic changes. Is not exertional but rather random. No shortness of breath. Endorses mild decreased hearing left ear to 40% compared to 80% in right since June when pain present. Denies difficulty moving eyes.  PMH - Smoking status noted.    Objective:  Physical Exam BP 116/76  Pulse 92  Temp(Src) 97.6 F (36.4 C) (Oral)  Ht 5' (1.524 m)  Wt 146 lb 3.2 oz (66.316 kg)  BMI 28.55 kg/m2 GEN: NAD, pleasant HEENT: AT/[place], sclera clear, EOMI, no ophthalmoplegia or proptosis, ears symmetric with no obvious swelling, TMs clear bilaterally. Pain opening jaw wide. Tender left jaw and entire left face. Right pupil slightly smaller than left with h/o cataract surgery; No facial or ear skin changes PULM: Normal effort    Assessment:     [name] CAVITTis a 73y.o. female with h/o left face pain here for f/u of pain.    Plan:     Ear pain The most likely diagnosis is painful trigeminal neuralgia, due to distribution of pain and improvement with gabapentin. Currently in a pain flare for the past 2 days. I have considered and concluded the patient has a very low likelihood of having: Thyroid tumor,  Acoustic neuroma, Myocardial infarction Temporal arteritis, Cholesteotoma, Tumors (ear, nasopharyngeal), Thoracic aneurysm. She has no chest pain, dyspnea, eye redness, fevers, chills, enlarged temporal artery (and ESR was only 39 on recent check), or vision changes.  - Vision test today to get baseline - Pt has visit with ophthalmologist in near future. - Continue gabapentin and increase nighttime dose to 2076m May increase another time of day to 20014mnd discuss with PCP for further recommendations on increasing in pt with Cr 1.57. - With weight loss, would also send to ENT for further eval if persists at f/u. - Return immediately next appearance of ear swelling, as this is not characteristic of TGN but not present today so I could not examine further. - Continue BPPV exercises. - Return precautions reviewed.  Follow-up: Follow up in 3-5 days with PCP if not improving. - F/u weight loss with PCP  MarHilton SinclairD [place]

## 2014-01-29 ENCOUNTER — Ambulatory Visit: Payer: Commercial Managed Care - HMO | Admitting: Family Medicine

## 2014-02-04 ENCOUNTER — Telehealth: Payer: Self-pay | Admitting: Family Medicine

## 2014-02-04 NOTE — Telephone Encounter (Signed)
Called and spoke with Toy Cookey, she is unable to see patient because she does not accept her insurance. Patient is aware and would like to know who will be able to she her. She became very upset and is concerned that there is no one that her insurance will cover. Patient states that "she is worried she will end up like she used to be if she doesn't get help soon"  Will forward message to Dr Gwenlyn Saran for any suggestions for another therapist.[name], Kevin Fenton

## 2014-02-04 NOTE — Telephone Encounter (Signed)
Patient called Dr. Gwenlyn Saran and was given # to Therapist named Toy Cookey, [phone]. She will need a referral to see her. Please call patient with questions.

## 2014-02-05 ENCOUNTER — Encounter (HOSPITAL_COMMUNITY): Payer: Self-pay | Admitting: Emergency Medicine

## 2014-02-05 ENCOUNTER — Emergency Department (INDEPENDENT_AMBULATORY_CARE_PROVIDER_SITE_OTHER)
Admission: EM | Admit: 2014-02-05 | Discharge: 2014-02-05 | Disposition: A | Discharge status: Home / Self Care | Payer: Commercial Managed Care - HMO | Source: Home / Self Care | Attending: Family Medicine | Admitting: Family Medicine

## 2014-02-05 DIAGNOSIS — M26629 Arthralgia of temporomandibular joint, unspecified side: Secondary | ICD-10-CM

## 2014-02-05 DIAGNOSIS — M2669 Other specified disorders of temporomandibular joint: Secondary | ICD-10-CM

## 2014-02-05 MED ORDER — DICLOFENAC 35 MG PO CAPS: 35.0000 mg | ORAL_CAPSULE | Freq: Two times a day (BID) | ORAL | Status: DC

## 2014-02-05 NOTE — Telephone Encounter (Signed)
Called and spoke with patient's daughter, she said her mother saw a therapist this morning and has a follow up appointment coming up with him. I told her that she can also call [facility]. [name], Kevin Fenton

## 2014-02-05 NOTE — ED Provider Notes (Signed)
CSN: [id number]     Arrival date & time 02/05/14  1817 History   First MD Initiated Contact with Patient 02/05/14 [place]     Chief Complaint  Patient presents with  . Otalgia   (Consider location/radiation/quality/duration/timing/severity/associated sxs/prior Treatment) Patient is a 73 y.o. female presenting with ear pain. The history is provided by the patient and a relative.  Otalgia Location:  Left Behind ear:  No abnormality Quality:  Sharp and shooting Severity:  Moderate Onset quality:  Gradual Duration:  2 days Progression:  Waxing and waning Chronicity:  Recurrent Context comment:  Pain with jaw rom. Associated symptoms: no ear discharge, no fever, no hearing loss, no rhinorrhea, no sore throat and no tinnitus     Past Medical History  Diagnosis Date  . Hypertension   . Heart murmur   . Pneumonia   . Anxiety   . Depression   . Psoriasis   . Arthritis   . Hypercholesteremia   . Congenital absence of one kidney     Pt has right kidney only  . Fibromyalgia   . Carpal tunnel syndrome, bilateral   . Chronic kidney disease     CKD stage III, absence of left kidney  . Peripheral vascular disease     Dr. Benjamine Sprague; right external iliac stent '06 with angioplasty '13  . Polymyalgia rheumatica    Past Surgical History  Procedure Laterality Date  . Abdominal hysterectomy      Partial   . Appendectomy    . Tonsillectomy    . Eye surgery    . Iliac artery stent    . Anterior cervical decomp/discectomy fusion  04/19/2012    Procedure: ANTERIOR CERVICAL DECOMPRESSION/DISCECTOMY FUSION 2 LEVELS;  Surgeon: Winfield Cunas, MD;  Location: [facility];  Service: Neurosurgery;  Laterality: N/A;  Cervical four-five,Cervical five-six anterior cervical decompression with fusion plating and bonegraft possible posterior cervical decompression  . Carpal tunnel release  1990   History reviewed. No pertinent family history. History  Substance Use Topics  . Smoking status: Former Smoker  -- 1.00 packs/day for 40 years    Types: Cigarettes    Quit date: 07/24/2010  . Smokeless tobacco: Never Used     Comment: STARTED BACK JULY 2013 AND RECENTLY QUIT 03-10-2012  . Alcohol Use: No     Comment: QUIT IN 1990    OB History   Grav Para Term Preterm Abortions TAB SAB Ect Mult Living                 Review of Systems  Constitutional: Negative.  Negative for fever.  HENT: Positive for dental problem and ear pain. Negative for ear discharge, facial swelling, hearing loss, rhinorrhea, sore throat and tinnitus.     Allergies  Gabapentin; Other; and Statins  Home Medications   Prior to Admission medications   Medication Sig Start Date End Date Taking? Authorizing Provider  amLODipine (NORVASC) 5 MG tablet Take 2 tablets (10 mg total) by mouth daily. 12/23/13  Yes Timmothy Euler, MD  aspirin EC 81 MG tablet Take 81 mg by mouth every morning.   Yes Historical Provider, MD  chlorthalidone (HYGROTON) 25 MG tablet Take 0.5 tablets (12.5 mg total) by mouth daily. 12/23/13  Yes Timmothy Euler, MD  clopidogrel (PLAVIX) 75 MG tablet Take 1 tablet (75 mg total) by mouth every morning. 12/23/13  Yes Timmothy Euler, MD  cyclobenzaprine (FLEXERIL) 10 MG tablet Take 1 tablet (10 mg total) by mouth 3 (three) times  daily as needed for muscle spasms. 12/23/13  Yes Timmothy Euler, MD  fenofibrate 160 MG tablet Take 1 tablet (160 mg total) by mouth daily. 07/28/13  Yes Timmothy Euler, MD  fluticasone (FLONASE) 50 MCG/ACT nasal spray Place 2 sprays into both nostrils daily. 12/23/13  Yes Timmothy Euler, MD  gabapentin (NEURONTIN) 100 MG capsule Take 1 capsule (100 mg total) by mouth 3 (three) times daily. 12/23/13  Yes Timmothy Euler, MD  loxapine (LOXITANE) 10 MG capsule Take 10 mg by mouth 3 (three) times daily.   Yes Historical Provider, MD  pantoprazole (PROTONIX) 40 MG tablet Take 1 tablet (40 mg total) by mouth daily. 12/23/13  Yes Timmothy Euler, MD  risedronate (ACTONEL) 150  MG tablet Take 150 mg by mouth every 30 (thirty) days. with water on empty stomach, nothing by mouth or lie down for next 30 minutes.   Yes Historical Provider, MD  sertraline (ZOLOFT) 100 MG tablet Take 1.5 tablets (150 mg total) by mouth every morning. 12/23/13  Yes Timmothy Euler, MD  traMADol (ULTRAM) 50 MG tablet Take 1 tablet (50 mg total) by mouth every 6 (six) hours as needed for moderate pain. 12/23/13  Yes Timmothy Euler, MD  Diclofenac (ZORVOLEX) 35 MG CAPS Take 35 mg by mouth 2 (two) times daily after a meal. 02/05/14   Billy Fischer, MD  predniSONE (DELTASONE) 1 MG tablet Take 2 tablets (2 mg total) by mouth daily with breakfast. For 1 month then 1 pill daily for one month then stop 12/23/13   Timmothy Euler, MD   BP 142/105  Pulse 98  Temp(Src) 98.9 F (37.2 C) (Oral)  Resp 20  SpO2 99% Physical Exam  Nursing note and vitals reviewed. Constitutional: She is oriented to person, place, and time. She appears well-developed and well-nourished. She appears distressed.  HENT:  Tender left tmj to palp reproducing sx and with jaw opening and closing.  Eyes: Pupils are equal, round, and reactive to light.  Neck: Normal range of motion. Neck supple.  Neurological: She is alert and oriented to person, place, and time.  Skin: Skin is warm and dry.  Psychiatric:  Very anxious woman.    ED Course  Procedures (including critical care time) Labs Review Labs Reviewed - No data to display  Imaging Review No results found.   MDM   1. TMJ pain dysfunction syndrome        Billy Fischer, MD 02/05/14 [id number]

## 2014-02-05 NOTE — ED Notes (Signed)
C/o L earache woke her up Tues night. Had He L ear canal swell shut 3 weeks ago.  States she felt a pain in her L ear that shot to her L eye and went blank for a few seconds.  Near syncope in waiting room.  C/o roaring and ringing in her L ear with vertigo x 3 days.  Dizziness worse today.  Hands shaky and c/o panic attack.  States her jaw feels heavy like she can't hold it up.

## 2014-02-05 NOTE — Discharge Instructions (Signed)
Ice to sore area, soft or liquid diet, use medicine as needed, see dr Hoyt Koch for further eval and care .

## 2014-03-04 ENCOUNTER — Encounter: Payer: Self-pay | Admitting: Family Medicine

## 2014-03-04 ENCOUNTER — Ambulatory Visit (INDEPENDENT_AMBULATORY_CARE_PROVIDER_SITE_OTHER): Payer: Commercial Managed Care - HMO | Admitting: Family Medicine

## 2014-03-04 VITALS — BP 103/65 | HR 101 | Temp 98.0°F | Wt 150.0 lb

## 2014-03-04 DIAGNOSIS — R3589 Other polyuria: Secondary | ICD-10-CM

## 2014-03-04 DIAGNOSIS — K219 Gastro-esophageal reflux disease without esophagitis: Secondary | ICD-10-CM

## 2014-03-04 DIAGNOSIS — R0609 Other forms of dyspnea: Secondary | ICD-10-CM

## 2014-03-04 DIAGNOSIS — R358 Other polyuria: Secondary | ICD-10-CM

## 2014-03-04 DIAGNOSIS — R0683 Snoring: Secondary | ICD-10-CM | POA: Insufficient documentation

## 2014-03-04 DIAGNOSIS — R0989 Other specified symptoms and signs involving the circulatory and respiratory systems: Secondary | ICD-10-CM

## 2014-03-04 DIAGNOSIS — I10 Essential (primary) hypertension: Secondary | ICD-10-CM

## 2014-03-04 DIAGNOSIS — M353 Polymyalgia rheumatica: Secondary | ICD-10-CM

## 2014-03-04 LAB — POCT GLYCOSYLATED HEMOGLOBIN (HGB A1C): Hemoglobin A1C: 4.9

## 2014-03-04 MED ORDER — PANTOPRAZOLE SODIUM 20 MG PO TBEC: 20.0000 mg | DELAYED_RELEASE_TABLET | Freq: Every day | ORAL | Status: DC

## 2014-03-04 NOTE — Assessment & Plan Note (Signed)
Well-controlled per her report Decrease Protonix to 20 mg daily

## 2014-03-04 NOTE — Assessment & Plan Note (Signed)
Controlled, 103/65 today With LE swelling will dc amlodipine Continue chlorthalidone, increase to 25 if needed Follow up 1 -2 months

## 2014-03-04 NOTE — Progress Notes (Signed)
Patient ID: [name] [name], female   DOB: Jun 20, 1941, 73 y.o.   MRN: [id number]  Kenn File, MD Phone: [phone]  Subjective:  Chief complaint-noted  Pt Here for followup hypertension  Hypertension Taking all her meds Stable dyspnea, seems to be better when she's exercising. Still with 3 pillow orthopnea, she also states that she's concerned that her issue of not able to lay flat as more about snoring and feeling like she is "getting choked on her tongue" No chest pain or palpitations Positive swelling for the last 6 weeks or so. Exercising more, feels that she is able to tolerate more than she was previously.  Polyuria-feels that she's been bleeding a lot worried than usual for the last 5-6 weeks. Bruising - feels that she is bruising very easily, explains that she's had several incidents that seems to explain them.  Left facial pain States that she's been treated for this by an oral surgeon and states that it's due to trigeminal nerve irritation at the TMJ joint. She's pursuing surgical correction for this  Polymyalgia rheumatica Has weaned herself off of prednisone denies any muscle pains at this time. States that she began the prednisone dose for this about 18 months ago. She's very happy to be off of chronic steroids  Complains of frequent snoring as above   Smoking status noted  ROS-  Per history of present illness  Past Medical History Patient Active Problem List   Diagnosis Date Noted  . Polyuria 03/04/2014  . Snoring 03/04/2014  . Glaucoma 01/08/2014  . PND (post-nasal drip) 12/23/2013  . Trigeminal neuralgia 12/18/2013  . Ear pain, left 12/16/2013  . Basal cell carcinoma 11/12/2013  . Depression 10/22/2013  . Presbyopia 10/22/2013  . Resting tremor 10/14/2013  . Hearing loss 10/13/2013  . Dizziness 10/05/2013  . Dysmetria 10/04/2013  . Low back pain 09/11/2013  . Polymyalgia rheumatica 09/11/2013  . GERD (gastroesophageal reflux disease) 09/11/2013    . Vulvar irritation 09/11/2013  . Non-healing ulcer 09/11/2013  . Rash and nonspecific skin eruption 07/28/2013  . Pulmonary nodules 05/24/2012  . DOE (dyspnea on exertion) 05/24/2012  . PVD (peripheral vascular disease) 04/30/2012  . Carotid bruit 04/30/2012  . Dyspnea 04/30/2012  . Hypertension 04/27/2012  . Chest pain 04/27/2012  . Anxiety and depression 04/27/2012  . Hyperlipemia 04/27/2012  . Cervical spondylosis with radiculopathy 04/19/2012    Medications- reviewed and updated Current Outpatient Prescriptions  Medication Sig Dispense Refill  . aspirin EC 81 MG tablet Take 81 mg by mouth every morning.      . chlorthalidone (HYGROTON) 25 MG tablet Take 0.5 tablets (12.5 mg total) by mouth daily.  90 tablet  3  . clopidogrel (PLAVIX) 75 MG tablet Take 1 tablet (75 mg total) by mouth every morning.  90 tablet  3  . cyclobenzaprine (FLEXERIL) 10 MG tablet Take 1 tablet (10 mg total) by mouth 3 (three) times daily as needed for muscle spasms.  90 tablet  3  . fenofibrate 160 MG tablet Take 1 tablet (160 mg total) by mouth daily.  90 tablet  3  . fluticasone (FLONASE) 50 MCG/ACT nasal spray Place 2 sprays into both nostrils daily.  16 g  11  . gabapentin (NEURONTIN) 100 MG capsule Take 1 capsule (100 mg total) by mouth 3 (three) times daily.  90 capsule  3  . loxapine (LOXITANE) 10 MG capsule Take 10 mg by mouth 3 (three) times daily.      . pantoprazole (PROTONIX) 20 MG tablet  Take 1 tablet (20 mg total) by mouth daily.  90 tablet  3  . risedronate (ACTONEL) 150 MG tablet Take 150 mg by mouth every 30 (thirty) days. with water on empty stomach, nothing by mouth or lie down for next 30 minutes.      . sertraline (ZOLOFT) 100 MG tablet Take 1.5 tablets (150 mg total) by mouth every morning.  135 tablet  3  . traMADol (ULTRAM) 50 MG tablet Take 1 tablet (50 mg total) by mouth every 6 (six) hours as needed for moderate pain.  270 tablet  3  . Diclofenac (ZORVOLEX) 35 MG CAPS Take 35 mg  by mouth 2 (two) times daily after a meal.  60 capsule  1  . predniSONE (DELTASONE) 1 MG tablet Take 2 tablets (2 mg total) by mouth daily with breakfast. For 1 month then 1 pill daily for one month then stop  90 tablet  0   No current facility-administered medications for this visit.    Objective: BP 103/65  Pulse 101  Temp(Src) 98 F (36.7 C) (Oral)  Wt 150 lb (68.04 kg) Gen: NAD, alert, cooperative with exam HEENT: NCAT CV: RRR, good S1/S2, no murmur Resp: CTABL, no wheezes, non-labored Ext: 1+ pitting edema on bilateral lower extremities Neuro: Alert and oriented, No gross deficits   Assessment/Plan:  Hypertension Controlled, 103/65 today With LE swelling will dc amlodipine Continue chlorthalidone, increase to 25 if needed Follow up 1 -2 months    Snoring Some concern for OSA Given her overall clinical picture and only grade 1 diastolic dysfunction in the past I think that this is more likely the explanation for her orthopnea than CHF. Ordered split night study.   Polyuria Polyuria for the last 4-6 weeks, check for DM 2 with A1c.  Polymyalgia rheumatica Now off of prednisone No pains at this time Also off of Plaquenil  GERD (gastroesophageal reflux disease) Well-controlled per her report Decrease Protonix to 20 mg daily    Orders Placed This Encounter  Procedures  . HgB A1c  . Split night study    Standing Status: Future     Number of Occurrences:      Standing Expiration Date: 03/05/2015    Order Specific Question:  Where should this test be performed:    Answer:  [facility] ordered this encounter  Medications  . pantoprazole (PROTONIX) 20 MG tablet    Sig: Take 1 tablet (20 mg total) by mouth daily.    Dispense:  90 tablet    Refill:  3

## 2014-03-04 NOTE — Assessment & Plan Note (Addendum)
Some concern for OSA Given her overall clinical picture and only grade 1 diastolic dysfunction in the past I think that this is more likely the explanation for her orthopnea than CHF. Ordered split night study.

## 2014-03-04 NOTE — Assessment & Plan Note (Signed)
Now off of prednisone No pains at this time Also off of Plaquenil

## 2014-03-04 NOTE — Assessment & Plan Note (Signed)
Polyuria for the last 4-6 weeks, check for DM 2 with A1c.

## 2014-03-04 NOTE — Patient Instructions (Signed)
Good to see you!  Stop the amlodipine, If your blood pressures are persistently higher than 130/90 then call me and we will increase your chlorthalidone to 1 whole pill daily.   If you feel that you shortness of breath is worsening or your edema gets worse then please come back right away.   Please come back in 4-6 weeks for follow up

## 2014-03-05 ENCOUNTER — Telehealth: Payer: Self-pay | Admitting: Family Medicine

## 2014-03-05 DIAGNOSIS — R0600 Dyspnea, unspecified: Secondary | ICD-10-CM

## 2014-03-05 DIAGNOSIS — C4491 Basal cell carcinoma of skin, unspecified: Secondary | ICD-10-CM

## 2014-03-05 DIAGNOSIS — R0609 Other forms of dyspnea: Principal | ICD-10-CM

## 2014-03-05 NOTE — Telephone Encounter (Signed)
Received fax from Dr. Linus Galas that she needs a referral to the [facility] from [place] to get it approved. She has basal cell of the scalp that needs Mohs.   Referral written will ask staff to arrange.   Laroy Apple, MD [facility] Resident, PGY-3 03/05/2014, 9:08 AM

## 2014-03-05 NOTE — Telephone Encounter (Signed)
Called to follow up A1C which is normal.   Recommended she get a proBNP to help evaluate for possible volume overload. She had edema and orthopnea in clinic with a Hx of grade 1 diastolic dysf.   She'll call in for lab appt.   Laroy Apple, MD [place] [facility] Resident, PGY-3 03/05/2014, 11:25 AM

## 2014-03-09 ENCOUNTER — Other Ambulatory Visit: Payer: Commercial Managed Care - HMO

## 2014-03-09 DIAGNOSIS — R0600 Dyspnea, unspecified: Secondary | ICD-10-CM

## 2014-03-09 DIAGNOSIS — R0609 Other forms of dyspnea: Principal | ICD-10-CM

## 2014-03-09 NOTE — Progress Notes (Signed)
[place]

## 2014-03-09 NOTE — Telephone Encounter (Signed)
[facility] not in patient network, insurance will not cover. Called Dr. Lucky Rathke office and they recommend referring to [facility].

## 2014-03-10 LAB — PRO B NATRIURETIC PEPTIDE: Pro B Natriuretic peptide (BNP): 333.4 pg/mL — ABNORMAL HIGH (ref <126)

## 2014-03-11 NOTE — Telephone Encounter (Signed)
Called [facility] Derm[facility] program, they need a copy of derm pathology report before they will schedule patient. Called Dr. Lucky Rathke office and requested they fax a copy of report. Awaiting fax

## 2014-03-11 NOTE — Telephone Encounter (Signed)
Patient demographics and derm pathology results faxed to [facility] derm surgery clinic for review. They will call me once appointment has been made.

## 2014-03-21 ENCOUNTER — Observation Stay (HOSPITAL_COMMUNITY)
Admission: EM | Admit: 2014-03-21 | Discharge: 2014-03-24 | Disposition: A | Discharge status: Skilled Nursing Facility | Payer: Medicare HMO | Attending: Family Medicine | Admitting: Family Medicine

## 2014-03-21 ENCOUNTER — Emergency Department (HOSPITAL_COMMUNITY): Payer: Medicare HMO

## 2014-03-21 ENCOUNTER — Encounter (HOSPITAL_COMMUNITY): Payer: Self-pay | Admitting: Emergency Medicine

## 2014-03-21 DIAGNOSIS — H409 Unspecified glaucoma: Secondary | ICD-10-CM | POA: Insufficient documentation

## 2014-03-21 DIAGNOSIS — Q602 Renal agenesis, unspecified: Secondary | ICD-10-CM | POA: Diagnosis not present

## 2014-03-21 DIAGNOSIS — I129 Hypertensive chronic kidney disease with stage 1 through stage 4 chronic kidney disease, or unspecified chronic kidney disease: Secondary | ICD-10-CM | POA: Insufficient documentation

## 2014-03-21 DIAGNOSIS — E785 Hyperlipidemia, unspecified: Secondary | ICD-10-CM | POA: Diagnosis not present

## 2014-03-21 DIAGNOSIS — Z9861 Coronary angioplasty status: Secondary | ICD-10-CM | POA: Insufficient documentation

## 2014-03-21 DIAGNOSIS — Z87891 Personal history of nicotine dependence: Secondary | ICD-10-CM | POA: Insufficient documentation

## 2014-03-21 DIAGNOSIS — F411 Generalized anxiety disorder: Secondary | ICD-10-CM | POA: Diagnosis not present

## 2014-03-21 DIAGNOSIS — IMO0001 Reserved for inherently not codable concepts without codable children: Secondary | ICD-10-CM | POA: Insufficient documentation

## 2014-03-21 DIAGNOSIS — R4182 Altered mental status, unspecified: Secondary | ICD-10-CM | POA: Diagnosis present

## 2014-03-21 DIAGNOSIS — Z7982 Long term (current) use of aspirin: Secondary | ICD-10-CM | POA: Diagnosis not present

## 2014-03-21 DIAGNOSIS — N179 Acute kidney failure, unspecified: Secondary | ICD-10-CM | POA: Diagnosis not present

## 2014-03-21 DIAGNOSIS — E7800 Pure hypercholesterolemia, unspecified: Secondary | ICD-10-CM | POA: Diagnosis not present

## 2014-03-21 DIAGNOSIS — N390 Urinary tract infection, site not specified: Secondary | ICD-10-CM | POA: Diagnosis not present

## 2014-03-21 DIAGNOSIS — H9190 Unspecified hearing loss, unspecified ear: Secondary | ICD-10-CM | POA: Diagnosis not present

## 2014-03-21 DIAGNOSIS — Q605 Renal hypoplasia, unspecified: Secondary | ICD-10-CM | POA: Diagnosis not present

## 2014-03-21 DIAGNOSIS — Z90710 Acquired absence of both cervix and uterus: Secondary | ICD-10-CM | POA: Diagnosis not present

## 2014-03-21 DIAGNOSIS — D649 Anemia, unspecified: Secondary | ICD-10-CM | POA: Diagnosis not present

## 2014-03-21 DIAGNOSIS — IMO0002 Reserved for concepts with insufficient information to code with codable children: Secondary | ICD-10-CM | POA: Insufficient documentation

## 2014-03-21 DIAGNOSIS — H524 Presbyopia: Secondary | ICD-10-CM | POA: Diagnosis not present

## 2014-03-21 DIAGNOSIS — G9340 Encephalopathy, unspecified: Secondary | ICD-10-CM | POA: Diagnosis not present

## 2014-03-21 DIAGNOSIS — F329 Major depressive disorder, single episode, unspecified: Secondary | ICD-10-CM | POA: Insufficient documentation

## 2014-03-21 DIAGNOSIS — G500 Trigeminal neuralgia: Secondary | ICD-10-CM | POA: Diagnosis not present

## 2014-03-21 DIAGNOSIS — K5900 Constipation, unspecified: Secondary | ICD-10-CM | POA: Insufficient documentation

## 2014-03-21 DIAGNOSIS — Z7902 Long term (current) use of antithrombotics/antiplatelets: Secondary | ICD-10-CM | POA: Insufficient documentation

## 2014-03-21 DIAGNOSIS — K219 Gastro-esophageal reflux disease without esophagitis: Secondary | ICD-10-CM | POA: Insufficient documentation

## 2014-03-21 DIAGNOSIS — I739 Peripheral vascular disease, unspecified: Secondary | ICD-10-CM | POA: Diagnosis not present

## 2014-03-21 DIAGNOSIS — M129 Arthropathy, unspecified: Secondary | ICD-10-CM | POA: Insufficient documentation

## 2014-03-21 DIAGNOSIS — Z9089 Acquired absence of other organs: Secondary | ICD-10-CM | POA: Diagnosis not present

## 2014-03-21 DIAGNOSIS — I10 Essential (primary) hypertension: Secondary | ICD-10-CM | POA: Diagnosis present

## 2014-03-21 DIAGNOSIS — N183 Chronic kidney disease, stage 3 unspecified: Secondary | ICD-10-CM | POA: Insufficient documentation

## 2014-03-21 DIAGNOSIS — Z23 Encounter for immunization: Secondary | ICD-10-CM | POA: Insufficient documentation

## 2014-03-21 DIAGNOSIS — F3289 Other specified depressive episodes: Secondary | ICD-10-CM | POA: Insufficient documentation

## 2014-03-21 DIAGNOSIS — Z85828 Personal history of other malignant neoplasm of skin: Secondary | ICD-10-CM | POA: Diagnosis not present

## 2014-03-21 DIAGNOSIS — M899 Disorder of bone, unspecified: Secondary | ICD-10-CM | POA: Diagnosis not present

## 2014-03-21 DIAGNOSIS — M949 Disorder of cartilage, unspecified: Secondary | ICD-10-CM

## 2014-03-21 DIAGNOSIS — N3000 Acute cystitis without hematuria: Secondary | ICD-10-CM | POA: Diagnosis not present

## 2014-03-21 DIAGNOSIS — M353 Polymyalgia rheumatica: Secondary | ICD-10-CM | POA: Diagnosis present

## 2014-03-21 DIAGNOSIS — R600 Localized edema: Secondary | ICD-10-CM

## 2014-03-21 LAB — COMPREHENSIVE METABOLIC PANEL
ALT: 12 U/L (ref 0–35)
AST: 28 U/L (ref 0–37)
Albumin: 3.6 g/dL (ref 3.5–5.2)
Alkaline Phosphatase: 68 U/L (ref 39–117)
Anion gap: 10 (ref 5–15)
BUN: 19 mg/dL (ref 6–23)
CO2: 34 mEq/L — ABNORMAL HIGH (ref 19–32)
Calcium: 9.4 mg/dL (ref 8.4–10.5)
Chloride: 92 mEq/L — ABNORMAL LOW (ref 96–112)
Creatinine, Ser: 1.31 mg/dL — ABNORMAL HIGH (ref 0.50–1.10)
GFR calc Af Amer: 45 mL/min — ABNORMAL LOW (ref >90)
GFR calc non Af Amer: 39 mL/min — ABNORMAL LOW (ref >90)
Glucose, Bld: 76 mg/dL (ref 70–99)
Potassium: 3.6 mEq/L — ABNORMAL LOW (ref 3.7–5.3)
Sodium: 136 mEq/L — ABNORMAL LOW (ref 137–147)
Total Bilirubin: 0.5 mg/dL (ref 0.3–1.2)
Total Protein: 6.5 g/dL (ref 6.0–8.3)

## 2014-03-21 LAB — URINE MICROSCOPIC-ADD ON

## 2014-03-21 LAB — URINALYSIS, ROUTINE W REFLEX MICROSCOPIC
Bilirubin Urine: NEGATIVE
Glucose, UA: NEGATIVE mg/dL
Hgb urine dipstick: NEGATIVE
Ketones, ur: NEGATIVE mg/dL
Nitrite: POSITIVE — AB
Protein, ur: NEGATIVE mg/dL
Specific Gravity, Urine: 1.009 (ref 1.005–1.030)
Urobilinogen, UA: 0.2 mg/dL (ref 0.0–1.0)
pH: 6 (ref 5.0–8.0)

## 2014-03-21 LAB — CBC WITH DIFFERENTIAL/PLATELET
Basophils Absolute: 0 10*3/uL (ref 0.0–0.1)
Basophils Relative: 0 % (ref 0–1)
Eosinophils Absolute: 0.1 10*3/uL (ref 0.0–0.7)
Eosinophils Relative: 2 % (ref 0–5)
HCT: 32.3 % — ABNORMAL LOW (ref 36.0–46.0)
Hemoglobin: 11.1 g/dL — ABNORMAL LOW (ref 12.0–15.0)
Lymphocytes Relative: 20 % (ref 12–46)
Lymphs Abs: 1.3 10*3/uL (ref 0.7–4.0)
MCH: 32.3 pg (ref 26.0–34.0)
MCHC: 34.4 g/dL (ref 30.0–36.0)
MCV: 93.9 fL (ref 78.0–100.0)
Monocytes Absolute: 0.6 10*3/uL (ref 0.1–1.0)
Monocytes Relative: 9 % (ref 3–12)
Neutro Abs: 4.5 10*3/uL (ref 1.7–7.7)
Neutrophils Relative %: 69 % (ref 43–77)
Platelets: 236 10*3/uL (ref 150–400)
RBC: 3.44 MIL/uL — ABNORMAL LOW (ref 3.87–5.11)
RDW: 14.4 % (ref 11.5–15.5)
WBC: 6.6 10*3/uL (ref 4.0–10.5)

## 2014-03-21 LAB — MAGNESIUM: Magnesium: 1.2 mg/dL — ABNORMAL LOW (ref 1.5–2.5)

## 2014-03-21 LAB — TROPONIN I: Troponin I: <0.3 ng/mL (ref <0.30)

## 2014-03-21 MED ORDER — POTASSIUM CHLORIDE CRYS ER 20 MEQ PO TBCR
40.0000 meq | EXTENDED_RELEASE_TABLET | Freq: Once | ORAL | Status: AC
  Administered 2014-03-21: 40 meq via ORAL
  Filled 2014-03-21: qty 2

## 2014-03-21 MED ORDER — FLUTICASONE PROPIONATE 50 MCG/ACT NA SUSP
2.0000 | Freq: Every day | NASAL | Status: DC
  Administered 2014-03-21 – 2014-03-24 (×4): 2 via NASAL
  Filled 2014-03-21: qty 16

## 2014-03-21 MED ORDER — SERTRALINE HCL 50 MG PO TABS
150.0000 mg | ORAL_TABLET | Freq: Every morning | ORAL | Status: DC
  Administered 2014-03-22 – 2014-03-24 (×3): 150 mg via ORAL
  Filled 2014-03-21 (×3): qty 1

## 2014-03-21 MED ORDER — CHLORTHALIDONE 25 MG PO TABS: 12.5000 mg | ORAL_TABLET | Freq: Every day | ORAL | Status: DC

## 2014-03-21 MED ORDER — DEXTROSE 5 % IV SOLN
1.0000 g | Freq: Once | INTRAVENOUS | Status: AC
  Administered 2014-03-21: 1 g via INTRAVENOUS
  Filled 2014-03-21: qty 10

## 2014-03-21 MED ORDER — LOXAPINE SUCCINATE 10 MG PO CAPS
10.0000 mg | ORAL_CAPSULE | Freq: Two times a day (BID) | ORAL | Status: DC
  Administered 2014-03-22 – 2014-03-24 (×4): 10 mg via ORAL
  Filled 2014-03-21 (×5): qty 1

## 2014-03-21 MED ORDER — INFLUENZA VAC SPLIT QUAD 0.5 ML IM SUSY
0.5000 mL | PREFILLED_SYRINGE | INTRAMUSCULAR | Status: AC
  Administered 2014-03-22: 0.5 mL via INTRAMUSCULAR
  Filled 2014-03-21: qty 0.5

## 2014-03-21 MED ORDER — HEPARIN SODIUM (PORCINE) 5000 UNIT/ML IJ SOLN
5000.0000 [IU] | Freq: Three times a day (TID) | INTRAMUSCULAR | Status: DC
  Administered 2014-03-21 – 2014-03-24 (×8): 5000 [IU] via SUBCUTANEOUS
  Filled 2014-03-21 (×12): qty 1

## 2014-03-21 MED ORDER — DEXTROSE 5 % IV SOLN
1.0000 g | INTRAVENOUS | Status: DC
  Filled 2014-03-21: qty 10

## 2014-03-21 MED ORDER — CHLORTHALIDONE 25 MG PO TABS
12.5000 mg | ORAL_TABLET | Freq: Every day | ORAL | Status: DC
  Administered 2014-03-22 – 2014-03-24 (×3): 12.5 mg via ORAL
  Filled 2014-03-21 (×4): qty 0.5

## 2014-03-21 MED ORDER — ASPIRIN EC 81 MG PO TBEC
81.0000 mg | DELAYED_RELEASE_TABLET | Freq: Every morning | ORAL | Status: DC
  Administered 2014-03-21 – 2014-03-24 (×4): 81 mg via ORAL
  Filled 2014-03-21 (×4): qty 1

## 2014-03-21 MED ORDER — TRAMADOL HCL 50 MG PO TABS
50.0000 mg | ORAL_TABLET | Freq: Four times a day (QID) | ORAL | Status: DC | PRN
  Administered 2014-03-21 – 2014-03-24 (×5): 50 mg via ORAL
  Filled 2014-03-21 (×6): qty 1

## 2014-03-21 MED ORDER — DEXTROSE 5 % IV SOLN: 1.0000 g | INTRAVENOUS | Status: DC

## 2014-03-21 MED ORDER — PANTOPRAZOLE SODIUM 20 MG PO TBEC
20.0000 mg | DELAYED_RELEASE_TABLET | Freq: Every day | ORAL | Status: DC
  Administered 2014-03-22 – 2014-03-24 (×3): 20 mg via ORAL
  Filled 2014-03-21 (×3): qty 1

## 2014-03-21 MED ORDER — CYCLOBENZAPRINE HCL 10 MG PO TABS
10.0000 mg | ORAL_TABLET | Freq: Three times a day (TID) | ORAL | Status: DC | PRN
  Administered 2014-03-23: 10 mg via ORAL
  Filled 2014-03-21: qty 1

## 2014-03-21 MED ORDER — FENOFIBRATE 160 MG PO TABS
160.0000 mg | ORAL_TABLET | Freq: Every day | ORAL | Status: DC
  Administered 2014-03-22 – 2014-03-24 (×3): 160 mg via ORAL
  Filled 2014-03-21 (×3): qty 1

## 2014-03-21 MED ORDER — CLOPIDOGREL BISULFATE 75 MG PO TABS
75.0000 mg | ORAL_TABLET | Freq: Every morning | ORAL | Status: DC
  Administered 2014-03-22 – 2014-03-24 (×3): 75 mg via ORAL
  Filled 2014-03-21 (×3): qty 1

## 2014-03-21 NOTE — Progress Notes (Addendum)
Attending Note   I agree with the History/assesment & plan per Resident MD as scribed elsewhere within the note and have independently examined as well as discussed the Plan of care with the patient, and ammendments were made to above note reflecting my thoughts after careful review of Database, Progress notes, Imaging and Consultant notes   73 y/o ?, h/o suspect or concerns, PVD, HTN, HLD, PMR, bipolar, history of cervical spondylosis status post cervical decompression and discectomy fusion 2 levels 04/19/12, pulmonary nodules on CT 04/29/12 discharge, recent concern for cerebellar stroke 10/07/13--ultimately thought to have labyrinthitis vs. early Parkinson's  Patient comes in today from home with her daughter as she was" feeling leg swelling, passing less urine felt poor balance. She states this happened long-term goal but it's been years ago. She stays with her daughter home and has some confusion as she thinks that her primary care physician works at [employer] and I try to correct her on this. She uses a walker at home but can only walk a couple of steps. She categorically states that she has had no fever no chills no falls recently but is unsteady on her feet. When I ask her what that means she says it is feet feel weak she does not feel like she is dizzy but again the history is not very clear. She tells me she has a little bit of burning in her urine but has no flank pain She states that she woke up will be scored felt cold she'll which she ascribed to being a seizure. She does have baseline tremor and states that her mother was diagnosed with Parkinson's at age of 58.  Exam she is anxious, smile symmetric extraocular movements intact Part 5/5 however she has little bit of hesitance with finger-nose-finger-I would say she has maybe unsteadiness not sure she has actual tremor Reflexes are 2/3 plantars are downgoing Sensory is intact she has a little bit of trace edema S1-S2 no murmur rub or  gallop Chest clinically clear Abdomen soft nontender benign  Her labs overall her not terribly concerning other than for proBNP of 333 first one occurred troponin 0.30. Hemoglobin is slightly low at 11.1 and is normocytic no white count. Her sedimentation rate in the past was 9 Her urine shows many back. Positive nitrites moderate leukocytes but this was a clean-catch  I suspect this lady has either some element of peripheral vertical and or beginning to work and since. Although she had multiple positive findings on the urinalysis, and not sure this needs to be treated as pyelonephritis vs. just a simple cystitis  Will get physical therapy to see her tomorrow Will defer until the morning need for neurology in as it seems like it is challenging for the patient gets him for appointments and if we do think that she has the beginnings of Parkinson's which is a clinical diagnosis, I prefer neurologist make that assessment and plan and titrate her medications while in hospital and then she can be followed up in the outpatient setting Resident tells me that her daughter states patient is now doing her vertigo exercises and the last PT 2 please reevaluate and teacher these once again  We will the plan for discharge in the morning dependent on physical therapy assessment   Nita Sells'

## 2014-03-21 NOTE — ED Provider Notes (Signed)
CSN: [id number]     Arrival date & time 03/21/14  1159 History   First MD Initiated Contact with Patient 03/21/14 1207     Chief Complaint  Patient presents with  . Abdominal Pain     (Consider location/radiation/quality/duration/timing/severity/associated sxs/prior Treatment) HPI Comments: Pt states that she is here because she has leg pain. Pt states that she hasn't felt good for over a week. She states that she think she had a seizure at some point. Daughter states that she has been having episodes over the last 10 days where she will just stare off and she will tell you that she is not sure what she is doing. Pt states that she told daughter to call ems because she just didn't feel right but she wasn't sure how. States in the last week she has had some abdominal pain and some tongue numbness. Pt states that she has also had intermittent headache  The history is provided by the patient. No language interpreter was used.    Past Medical History  Diagnosis Date  . Hypertension   . Heart murmur   . Pneumonia   . Anxiety   . Depression   . Psoriasis   . Arthritis   . Hypercholesteremia   . Congenital absence of one kidney     Pt has right kidney only  . Fibromyalgia   . Carpal tunnel syndrome, bilateral   . Chronic kidney disease     CKD stage III, absence of left kidney  . Peripheral vascular disease     Dr. Benjamine Sprague; right external iliac stent '06 with angioplasty '13  . Polymyalgia rheumatica    Past Surgical History  Procedure Laterality Date  . Abdominal hysterectomy      Partial   . Appendectomy    . Tonsillectomy    . Eye surgery    . Iliac artery stent    . Anterior cervical decomp/discectomy fusion  04/19/2012    Procedure: ANTERIOR CERVICAL DECOMPRESSION/DISCECTOMY FUSION 2 LEVELS;  Surgeon: Winfield Cunas, MD;  Location: [facility];  Service: Neurosurgery;  Laterality: N/A;  Cervical four-five,Cervical five-six anterior cervical decompression with fusion plating  and bonegraft possible posterior cervical decompression  . Carpal tunnel release  1990   History reviewed. No pertinent family history. History  Substance Use Topics  . Smoking status: Former Smoker -- 1.00 packs/day for 40 years    Types: Cigarettes    Quit date: 07/24/2010  . Smokeless tobacco: Never Used     Comment: STARTED BACK JULY 2013 AND RECENTLY QUIT 03-10-2012  . Alcohol Use: No     Comment: QUIT IN 1990    OB History   Grav Para Term Preterm Abortions TAB SAB Ect Mult Living                 Review of Systems  Constitutional: Negative.   Respiratory: Negative.   Cardiovascular: Negative.       Allergies  Gabapentin; Other; and Statins  Home Medications   Prior to Admission medications   Medication Sig Start Date End Date Taking? Authorizing Provider  aspirin EC 81 MG tablet Take 81 mg by mouth every morning.    Historical Provider, MD  chlorthalidone (HYGROTON) 25 MG tablet Take 0.5 tablets (12.5 mg total) by mouth daily. 12/23/13   Timmothy Euler, MD  clopidogrel (PLAVIX) 75 MG tablet Take 1 tablet (75 mg total) by mouth every morning. 12/23/13   Timmothy Euler, MD  cyclobenzaprine (FLEXERIL) 10 MG tablet  Take 1 tablet (10 mg total) by mouth 3 (three) times daily as needed for muscle spasms. 12/23/13   Timmothy Euler, MD  Diclofenac (ZORVOLEX) 35 MG CAPS Take 35 mg by mouth 2 (two) times daily after a meal. 02/05/14   Billy Fischer, MD  fenofibrate 160 MG tablet Take 1 tablet (160 mg total) by mouth daily. 07/28/13   Timmothy Euler, MD  fluticasone (FLONASE) 50 MCG/ACT nasal spray Place 2 sprays into both nostrils daily. 12/23/13   Timmothy Euler, MD  gabapentin (NEURONTIN) 100 MG capsule Take 1 capsule (100 mg total) by mouth 3 (three) times daily. 12/23/13   Timmothy Euler, MD  loxapine (LOXITANE) 10 MG capsule Take 10 mg by mouth 3 (three) times daily.    Historical Provider, MD  pantoprazole (PROTONIX) 20 MG tablet Take 1 tablet (20 mg total) by  mouth daily. 03/04/14   Timmothy Euler, MD  predniSONE (DELTASONE) 1 MG tablet Take 2 tablets (2 mg total) by mouth daily with breakfast. For 1 month then 1 pill daily for one month then stop 12/23/13   Timmothy Euler, MD  risedronate (ACTONEL) 150 MG tablet Take 150 mg by mouth every 30 (thirty) days. with water on empty stomach, nothing by mouth or lie down for next 30 minutes.    Historical Provider, MD  sertraline (ZOLOFT) 100 MG tablet Take 1.5 tablets (150 mg total) by mouth every morning. 12/23/13   Timmothy Euler, MD  traMADol (ULTRAM) 50 MG tablet Take 1 tablet (50 mg total) by mouth every 6 (six) hours as needed for moderate pain. 12/23/13   Timmothy Euler, MD   BP 125/69  Pulse 76  Temp(Src) 98 F (36.7 C) (Oral)  Resp 14  SpO2 93% Physical Exam  Nursing note and vitals reviewed. Constitutional: She appears well-developed and well-nourished.  HENT:  Head: Atraumatic.  Right Ear: External ear normal.  Eyes: Conjunctivae and EOM are normal. Pupils are equal, round, and reactive to light.  Neck: Normal range of motion. Neck supple.  Cardiovascular: Normal rate and regular rhythm.   Pulmonary/Chest: Effort normal and breath sounds normal.  Abdominal: Soft. Bowel sounds are normal. There is no tenderness.  Musculoskeletal: Normal range of motion.  Edema noted bilaterally  Neurological: She is alert. She exhibits normal muscle tone. Coordination normal.  Skin: Skin is warm and dry.  Psychiatric: She has a normal mood and affect.    ED Course  Procedures (including critical care time) Labs Review Labs Reviewed  URINALYSIS, ROUTINE W REFLEX MICROSCOPIC - Abnormal; Notable for the following:    APPearance CLOUDY (*)    Nitrite POSITIVE (*)    Leukocytes, UA MODERATE (*)    All other components within normal limits  CBC WITH DIFFERENTIAL - Abnormal; Notable for the following:    RBC 3.44 (*)    Hemoglobin 11.1 (*)    HCT 32.3 (*)    All other components within  normal limits  COMPREHENSIVE METABOLIC PANEL - Abnormal; Notable for the following:    Sodium 136 (*)    Potassium 3.6 (*)    Chloride 92 (*)    CO2 34 (*)    Creatinine, Ser 1.31 (*)    GFR calc non Af Amer 39 (*)    GFR calc Af Amer 45 (*)    All other components within normal limits  URINE MICROSCOPIC-ADD ON - Abnormal; Notable for the following:    Bacteria, UA MANY (*)  All other components within normal limits  URINE CULTURE  TROPONIN I    Imaging Review Dg Chest 2 View  03/21/2014   CLINICAL DATA:  Cough for 1 week.  Right lower abdominal pain.  EXAM: CHEST  2 VIEW  COMPARISON:  08/01/2013  FINDINGS: The heart size and mediastinal contours are within normal limits. The lung volumes appear decreased and there is atelectasis within the lung bases. The bones appear diffusely osteopenic. There is degenerative disc disease noted within the thoracic spine.  IMPRESSION: Bibasilar atelectasis.  Osteopenia and thoracic spondylosis.   Electronically Signed   By: Kerby Moors M.D.   On: 03/21/2014 14:04   Ct Head Wo Contrast  03/21/2014   CLINICAL DATA:  Altered mental status  EXAM: CT HEAD WITHOUT CONTRAST  TECHNIQUE: Contiguous axial images were obtained from the base of the skull through the vertex without intravenous contrast.  COMPARISON:  10/06/2013  FINDINGS: No acute cortical infarct, hemorrhage, or mass lesion ispresent. Ventricles are of normal size. No significant extra-axial fluid collection is present. The paranasal sinuses andmastoid air cells are clear. The osseous skull is intact.  IMPRESSION: 1. Normal brain.  No acute intracranial abnormalities noted.   Electronically Signed   By: Kerby Moors M.D.   On: 03/21/2014 14:21     EKG Interpretation   Date/Time:  Saturday March 21 2014 12:08:56 EDT Ventricular Rate:  79 PR Interval:  147 QRS Duration: 104 QT Interval:  394 QTC Calculation: 452 R Axis:   65 Text Interpretation:  Sinus rhythm Baseline wander When  compared with ECG  of 10/04/2013 No significant change was found Confirmed by [facility]  MD,  Nunzio Cory [phone]) on 03/21/2014 12:25:38 PM      MDM   Final diagnoses:  UTI (lower urinary tract infection)  Altered mental status, unspecified altered mental status type    Pt is neurologically intact. Pt treated for uti. No focal episode noted. Pt is to be admitted to fp.possible seizure activity. Ct negative    Glendell Docker, NP 03/21/14 1549

## 2014-03-21 NOTE — ED Notes (Signed)
Per EMS called to residence for SOB related to right flank pain. Reported right LQ pain also; SOB and pain resolved in route. Then reported headache with tongue numbness; reports this is just "a little bit" now. Reports weakness. Difficult to obtain chief complaint. EMS reports very vague in complaint. Family has not arrived yet. Oriented to self and place. Disoriented to time- unknown if this is baseline.

## 2014-03-21 NOTE — ED Notes (Signed)
Patient returned from X-ray 

## 2014-03-21 NOTE — H&P (Signed)
[facility] Admission History and Physical Service Pager: [phone]  Patient name: [name] [name] record number: [id number] Date of birth: 1940/09/20 Age: 73 y.o. Gender: female  Primary Care Provider: Kenn File, MD Consultants: None Code Status: Full  Chief Complaint: per daughter: staring in to space  Assessment and Plan: [name] [name] is a 73 y.o. female with PVD, HTN, and PMR admitted for UTI and ?acute encephalopathy.   Acute encephalopathy likely 2/2 UTI--daughter reports for past week patient has occasionally apparently been staring off into space, including this morning, but she remains responsive and talking during "episodes".  Currently, she is lethargic but responding appropriately and following commands.  Mental status could also be secondary to polypharmacy--home medications include tramadol, zoloft, loxapine, gabapentin, and flexeril. ?absence seizures given daughters description. CVA seems unlikely given no clear deficits on neuro exam and CT head negative for acute infarct.   -admit to med/surg -neuro checks q4h -continue treatment for UTI (expect mental status to improve with treatment of infection) -if any signs of ?seizure activity, would consider EEG vs. Continuous EEG -PT evaluation, currently lives at home with daughter -?hx of parkinson's--may need neurology evaluation for further evaluation  UTI--endorses frequency but denies dysuria. U/A on admission with moderate leukocytes, positive nitrite, and 21-50 wbc.  -continue rocephin for now and transition to po with further clinical improvement -follow urine cx  HTN--normotensive on admission. On chlorthalidone at home. Slightly hypokalemia likely 2/2 diuretic.  -continue chlorthalidone -replace K -check mag  Fibromyalgia and PMR--on gabapentin, flexeril, and tramadol at home -continue flexeril prn and tramadol. Holding gabapentin   PVD--stent in RLE. On ASA and  Plavix -continue home asa and plavix  CKD 3--Cr stable at 1.3 on admission that appears to be baseline. 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  -replace K, check mag -AM BMET -will not order diuresis at this time given CKD and likely dependent edema  Lower extremity edema--?venous insufficiency and dependent edema.  Echo 56-97%, normal systolic fucntion -elevate legs, consider compression stockings   Vertigo--hx of BPPV per daughter but she has not been doing her exercise -PT consult to help restart exercises  FEN/GI: HH/Protonix Prophylaxis: Heparin  Disposition: admit as observation, possible d/c home tomorrow pending clinical improvement  History of Present Illness: [name] [name] is a 73 y.o. female with PMH of fibromyalgia, HTN, depression and anxiety presenting to the ED with her daughter for complaints of not feeling well and staring into space (per daughter).  [name] reports "feeling lousy" over the past few days and daughter says she apparently had a cold and diarrhea last week, felt a little better a few days after that but then started feeling poorly again, so she brought her to the ED.  [name] denies dysuria but does endorse frequency and reports feeling more tired than usual.  Regular BM this morning.  No fever or chills that she knows of and slight abdominal discomfort at this time. Per daughter, she has baseline DOE and patient reports having chronic neck and back pain.   Review of Systems:  Constitutional:  Denies fever, chills. Fatigue  HEENT:  Denies congestion. Sore throat  Respiratory:  DOE  Cardiovascular:  Denies chest pain. +b/l lower extremity edema   Gastrointestinal:  Denies nausea, vomiting, diarrhea, constipation  Genitourinary:  Denies dysuria. Frequency  Musculoskeletal:  Chronic back and neck pain  Skin:  Rash and bruising  Neurological:  Baseline vertigo with hx of BPPV  Patient Active Problem List   Diagnosis Date Noted  . UTI (lower  urinary tract infection) 03/21/2014  . Bilateral edema of lower extremity 03/21/2014  . Altered mental status 03/21/2014  . Polyuria 03/04/2014  . Snoring 03/04/2014  . Glaucoma 01/08/2014  . PND (post-nasal drip) 12/23/2013  . Trigeminal neuralgia 12/18/2013  . Ear pain, left 12/16/2013  . Basal cell carcinoma 11/12/2013  . Depression 10/22/2013  . Presbyopia 10/22/2013  . Resting tremor 10/14/2013  . Hearing loss 10/13/2013  . Dizziness 10/05/2013  . Dysmetria 10/04/2013  . Low back pain 09/11/2013  . Polymyalgia rheumatica 09/11/2013  . GERD (gastroesophageal reflux disease) 09/11/2013  . Vulvar irritation 09/11/2013  . Non-healing ulcer 09/11/2013  . Rash and nonspecific skin eruption 07/28/2013  . Pulmonary nodules 05/24/2012  . DOE (dyspnea on exertion) 05/24/2012  . PVD (peripheral vascular disease) 04/30/2012  . Carotid bruit 04/30/2012  . Dyspnea 04/30/2012  . Hypertension 04/27/2012  . Chest pain 04/27/2012  . Anxiety and depression 04/27/2012  . Hyperlipemia 04/27/2012  . Cervical spondylosis with radiculopathy 04/19/2012   Past Medical History: Past Medical History  Diagnosis Date  . Hypertension   . Heart murmur   . Pneumonia   . Anxiety   . Depression   . Psoriasis   . Arthritis   . Hypercholesteremia   . Congenital absence of one kidney     Pt has right kidney only  . Fibromyalgia   . Carpal tunnel syndrome, bilateral   . Chronic kidney disease     CKD stage III, absence of left kidney  . Peripheral vascular disease     Dr. Benjamine Sprague; right external iliac stent '06 with angioplasty '13  . Polymyalgia rheumatica    Past Surgical History: Past Surgical History  Procedure Laterality Date  . Abdominal hysterectomy      Partial   . Appendectomy    . Tonsillectomy    . Eye surgery    . Iliac artery stent    . Anterior cervical decomp/discectomy fusion  04/19/2012    Procedure: ANTERIOR CERVICAL DECOMPRESSION/DISCECTOMY FUSION 2 LEVELS;  Surgeon:  Winfield Cunas, MD;  Location: [facility];  Service: Neurosurgery;  Laterality: N/A;  Cervical four-five,Cervical five-six anterior cervical decompression with fusion plating and bonegraft possible posterior cervical decompression  . Carpal tunnel release  1990   Social History: History  Substance Use Topics  . Smoking status: Former Smoker -- 1.00 packs/day for 40 years    Types: Cigarettes    Quit date: 07/24/2010  . Smokeless tobacco: Never Used     Comment: STARTED BACK JULY 2013 AND RECENTLY QUIT 03-10-2012  . Alcohol Use: No     Comment: QUIT IN 1990    Additional social history: lives at home with daughter Please also refer to relevant sections of EMR.  Family History: History reviewed. No pertinent family history. Allergies and Medications: Allergies  Allergen Reactions  . Gabapentin Itching  . Other Other (See Comments)    Cholesterol medications cause muscle pain  . Statins     Swelling involving tongue, rash   No current facility-administered medications on file prior to encounter.   Current Outpatient Prescriptions on File Prior to Encounter  Medication Sig Dispense Refill  . aspirin EC 81 MG tablet Take 81 mg by mouth every morning.      . clopidogrel (PLAVIX) 75 MG tablet Take 1 tablet (75 mg total) by mouth every morning.  90 tablet  3  . cyclobenzaprine (FLEXERIL) 10 MG tablet  Take 1 tablet (10 mg total) by mouth 3 (three) times daily as needed for muscle spasms.  90 tablet  3  . fenofibrate 160 MG tablet Take 1 tablet (160 mg total) by mouth daily.  90 tablet  3  . fluticasone (FLONASE) 50 MCG/ACT nasal spray Place 2 sprays into both nostrils daily.  16 g  11  . gabapentin (NEURONTIN) 100 MG capsule Take 1 capsule (100 mg total) by mouth 3 (three) times daily.  90 capsule  3  . loxapine (LOXITANE) 10 MG capsule Take 10 mg by mouth 2 (two) times daily.       . pantoprazole (PROTONIX) 20 MG tablet Take 1 tablet (20 mg total) by mouth daily.  90 tablet  3  .  risedronate (ACTONEL) 150 MG tablet Take 150 mg by mouth every 30 (thirty) days. with water on empty stomach, nothing by mouth or lie down for next 30 minutes.      . sertraline (ZOLOFT) 100 MG tablet Take 1.5 tablets (150 mg total) by mouth every morning.  135 tablet  3  . traMADol (ULTRAM) 50 MG tablet Take 1 tablet (50 mg total) by mouth every 6 (six) hours as needed for moderate pain.  270 tablet  3   Objective: BP 119/72  Pulse 88  Temp(Src) 98.4 F (36.9 C) (Oral)  Resp 16  Ht 5' (1.524 m)  Wt 154 lb 12.2 oz (70.2 kg)  BMI 30.23 kg/m2  SpO2 93% Exam: General: lying in bed, slow to talk, feeling tired, NAD HEENT: PERLA, EOMI (had to be directed a few times to look down) Cardiovascular: RRR ?2/6 SEM loudest LUSB, ?gallop Respiratory: CTA b/l Abdomen: soft, non-tender, +bs, +cva tenderness on right Extremities: +2 pitting edema b/l lower extremities, dependent edema on lateral surface of thighs as well, tenderness to palpation of extremities, +2dp b/l, lower back pain tenderness to palpation, stiff on ROM of neck (chronic) Skin: scattered ecchymosis on b/l extremities, scaly erythematous patches on extremities Neuro: AAOX3, lethargic but easily arousable, intention tremor, noted more on pointing, but finger to nose testing in tact, sensation grossly intact, strength 3/5 b/l extremities, slight discrepancy on sensation on face on right and left, did not have her stand due to weakness.   Labs and Imaging: CBC BMET   Recent Labs Lab 03/21/14 1249  WBC 6.6  HGB 11.1*  HCT 32.3*  PLT 236    Recent Labs Lab 03/21/14 1249  NA 136*  K 3.6*  CL 92*  CO2 34*  BUN 19  CREATININE 1.31*  GLUCOSE 76  CALCIUM 9.4    U/A: positive nitrite, moderate leukocytes, 21-50 wbc Trop x1 neg  [name], MD 03/21/2014, 6:25 PM [place] Intern pager: [phone], text pages welcome

## 2014-03-21 NOTE — H&P (Signed)
See note elsewhere in chart please

## 2014-03-21 NOTE — ED Notes (Signed)
Patient transported to X-ray 

## 2014-03-22 DIAGNOSIS — N390 Urinary tract infection, site not specified: Secondary | ICD-10-CM | POA: Diagnosis not present

## 2014-03-22 LAB — BASIC METABOLIC PANEL
Anion gap: 15 (ref 5–15)
BUN: 17 mg/dL (ref 6–23)
CO2: 27 mEq/L (ref 19–32)
Calcium: 9.5 mg/dL (ref 8.4–10.5)
Chloride: 93 mEq/L — ABNORMAL LOW (ref 96–112)
Creatinine, Ser: 1.02 mg/dL (ref 0.50–1.10)
GFR calc Af Amer: 61 mL/min — ABNORMAL LOW (ref >90)
GFR calc non Af Amer: 53 mL/min — ABNORMAL LOW (ref >90)
Glucose, Bld: 150 mg/dL — ABNORMAL HIGH (ref 70–99)
Potassium: 4.1 mEq/L (ref 3.7–5.3)
Sodium: 135 mEq/L — ABNORMAL LOW (ref 137–147)

## 2014-03-22 LAB — CK: Total CK: 43 U/L (ref 7–177)

## 2014-03-22 LAB — GLUCOSE, CAPILLARY
Glucose-Capillary: 110 mg/dL — ABNORMAL HIGH (ref 70–99)
Glucose-Capillary: 68 mg/dL — ABNORMAL LOW (ref 70–99)

## 2014-03-22 LAB — MAGNESIUM: Magnesium: 1.4 mg/dL — ABNORMAL LOW (ref 1.5–2.5)

## 2014-03-22 MED ORDER — POLYETHYLENE GLYCOL 3350 17 G PO PACK: 17.0000 g | PACK | Freq: Every day | ORAL | Status: DC

## 2014-03-22 MED ORDER — POLYETHYLENE GLYCOL 3350 17 G PO PACK
17.0000 g | PACK | Freq: Every day | ORAL | Status: DC
Start: 2014-03-22 — End: 2014-03-24
  Administered 2014-03-22 – 2014-03-23 (×2): 17 g via ORAL
  Filled 2014-03-22 (×3): qty 1

## 2014-03-22 MED ORDER — CEPHALEXIN 500 MG PO CAPS
500.0000 mg | ORAL_CAPSULE | Freq: Two times a day (BID) | ORAL | Status: DC
  Administered 2014-03-22 – 2014-03-23 (×3): 500 mg via ORAL
  Filled 2014-03-22 (×4): qty 1

## 2014-03-22 MED ORDER — CEPHALEXIN 500 MG PO CAPS: 500.0000 mg | ORAL_CAPSULE | Freq: Two times a day (BID) | ORAL | Status: DC

## 2014-03-22 MED ORDER — MAGNESIUM SULFATE 40 MG/ML IJ SOLN
2.0000 g | Freq: Once | INTRAMUSCULAR | Status: AC
  Administered 2014-03-22: 2 g via INTRAVENOUS
  Filled 2014-03-22 (×2): qty 50

## 2014-03-22 NOTE — Clinical Social Work Psychosocial (Signed)
Clinical Social Work Department BRIEF PSYCHOSOCIAL ASSESSMENT 03/22/2014  Patient:  [name] [name], [name] [name]     Account Number:  [id number]     Admit date:  03/21/2014  Clinical Social Worker:  Hubert Azure  Date/Time:  03/22/2014 06:37 PM  Referred by:  Physician  Date Referred:  03/22/2014 Referred for  SNF Placement   Other Referral:   Interview type:  Patient Other interview type:    PSYCHOSOCIAL DATA Living Status:  WITH ADULT CHILDREN Admitted from facility:   Level of care:   Primary support name:  [name] [phone]) Primary support relationship to patient:  CHILD, ADULT Degree of support available:   Good, patient resides with daughter.    CURRENT CONCERNS Current Concerns  Post-Acute Placement   Other Concerns:    SOCIAL WORK ASSESSMENT / PLAN CSW met with patient who was alert and oriented x4. CSW introduced self and explained role. CSW discussed d/c plan with patient. Per patient, she resides with daughter [name] and uses a walker to assist with ambulations. Patient is agreeable to SNF placement and listed preference for [facility].   Assessment/plan status:  Other - See comment Other assessment/ plan:   CSW to complete FL2 for SNF placement.   Information/referral to community resources:    PATIENT'S/FAMILY'S RESPONSE TO PLAN OF CARE: Patient was cooperative, but clearly in pain. Patient is agreeable to SNF and thanked CSW for time.   [place], [place] Weekend Clinical Social Worker [phone]

## 2014-03-22 NOTE — Progress Notes (Signed)
Patient is agreeable to SNF and Crystal with SW is aware.

## 2014-03-22 NOTE — Progress Notes (Signed)
Family Medicine Teaching Service Daily Progress Note Intern Pager: [phone]  Patient name: [name] [name] record number: [id number] Date of birth: 19-Sep-1940 Age: 73 y.o. Gender: female  Primary Care Provider: Kenn File, MD Consultants: None Code Status: Full  Pt Overview and Major Events to Date:  9/12 - Admitted for AMS and cystitis  9/13 - Mental status at baseline with improving urinary symptoms   Assessment and Plan:  Uncomplicated Cystitis - Improving urinary symptoms with mild suprapubic tenderness and urinary burning/frequency with no fever or leukocytosis. -Transition IV ceftriaxone to PO cephalexin 500 mg BID (1st dose today) and continue for 4 days for total 5 day antibiotic course  -Follow-up urinary cultures and adjust antibiotics as necessary  Constipation - Reports no BM since 1 day ago -Miralax 17g daily  -Avoid narcotic use   Hypomagnesemia - Mg level 1.2 on 03/21/14 -Administer IV 2 g magnesium sulfate -Repeat Mg level   Hypertension - Mildly hypertensive today (142/67) -Continue home chlorthalidone 12.5 -25 mg daily   Acute Encephalopathy - Resolved. Etiology most likely due to uncomplicated cystitis  AKI on CKD Stage 3 - Resolved. Cr 1.02 today at baseline of 1. Most likely pre-renal azotemia in setting of hypovolemia.   Polymyalgia Rheumatica- Pt with chronic LE weakness and difficulty with ambulation. No new focal neurological deficits on exam. Pt was previously on plaquenil and corticosteroids.   -Awaiting PT consult to determine disposition  -Obtain CK level in setting of fenofibrate use  -Continue home tramadol 50 mg Q 6hr PRN pain  and cyclobenzaprine 100 mg TID PRN spasms -Holding home gabapentin 100 mg TID (pt states she does not take it) -Obtain ESR and consider rheumatology referral  as outpatient     BPPV - Currently with no vertigo. Pt not on meclizine at home. -Pt may need outpatient neuro rehabilitation    Osteopenia - Apparent  on recent CXR imaging. -Continue home risedronate 150 mg monthly   PVD - s/p RLE stent -Continue home aspirin 81 mg daily and plavix 75 mg daily   Hyperlipidemia - Last lipid panel on 06/27/13 with hypercholesteremia 236 and elevated 138 -Obtain CK level  -Continue home fenofibrate 160 mg daily, consider discontinuing since TG level normal   Chronic Normocytic Anemia  - Pt with Hg 11.1 on 9/12 with baseline 13 with no active bleeding or hemodynamic instability. Pt is s/p hysterectomy. Unclear if has had colonoscopy in past.  -Monitor for bleeding  Depression & Anxiety - Currently with stable mood -Continue home sertraline 150 mg daily   GERD - Currently with no acid reflux symptoms.  -Continue home protonix 20 mg daily   Diet: Heart  DVT PPx: SQ Heparin TID  Disposition: Awaiting PT evaluation, most likely discharge today   Subjective:   Pt seen and examined in AM. No acute events overnight. She is alert and orientated and denies confusion.  Pt reports she is constipated with mild suprapubic pain.  She has mild urinary frequency and burning. She denies fever, chills, nausea, vomiting, or flank pain. She has chronic LE weakness and edema that is no worse than normal. She denies vertigo or lightheadedness.   Objective: Temp:  [97.9 F (36.6 C)-98.8 F (37.1 C)] 98.8 F (37.1 C) (09/13 0602) Pulse Rate:  [74-92] 87 (09/13 0602) Resp:  [14-18] 16 (09/13 0602) BP: (108-143)/(60-72) 142/67 mmHg (09/13 0602) SpO2:  [93 %-100 %] 95 % (09/13 0602) Weight:  [151 lb 7.3 oz (68.7 kg)-154 lb 12.2 oz (70.2 kg)] 151 lb  7.3 oz (68.7 kg) (09/13 0500)  Physical Exam: General: Sitting up in bed with no acute distress Cardiovascular: Normal rate and rhythm  Respiratory: Decreased breath sounds. No rales, wheezing, or rhonchi  Abdomen: Soft, non-tender, non-distended, normal BS, no guarding, rigidity, or rebound,  Extremities: No LE edema, chronic venous stasis changes of LE Neuro: A & O x 3,  Decreased sensation of left face.  5/5 UE muscle strength,  2/5 LE strength (poor effort). Normal sensation to light touch of extremities.     Laboratory:  Recent Labs Lab 03/21/14 1249  WBC 6.6  HGB 11.1*  HCT 32.3*  PLT 236    Recent Labs Lab 03/21/14 1249  NA 136*  K 3.6*  CL 92*  CO2 34*  BUN 19  CREATININE 1.31*  CALCIUM 9.4  PROT 6.5  BILITOT 0.5  ALKPHOS 68  ALT 12  AST 28  GLUCOSE 76      [name] Szuch, MD 03/22/2014, 9:29 AM PGY-2, IMTS

## 2014-03-22 NOTE — Progress Notes (Signed)
See note as dictated elsewhere in chart

## 2014-03-22 NOTE — Progress Notes (Signed)
Attending Note   I agree with the History/assesment & plan per Resident MD as scribed elsewhere within the note and have independently examined as well as discussed the Plan of care with the patient, and ammendments were made to above note reflecting my thoughts after careful review of Database, Progress notes, Imaging and Consultant notes   73 y/o ?, h/o suspect or concerns, PVD, HTN, HLD, PMR, bipolar, history of cervical spondylosis status post cervical decompression and discectomy fusion 2 levels 04/19/12, pulmonary nodules on CT 04/29/12 discharge, recent concern for cerebellar stroke 10/07/13--ultimately thought to have labyrinthitis vs. early Parkinson's   Exam she is anxious, smile symmetric extraocular movements intact Part 5/5 however she has little bit of hesitance with finger-nose-finger-I would say she has maybe unsteadiness not sure she has actual tremor Reflexes are 2/3 plantars are downgoing Sensory is intact she has a little bit of trace edema S1-S2 no murmur rub or gallop Chest clinically clear Abdomen soft nontender benign   On admission Labs not terribly concerning other than for proBNP of 333 first one occurred troponin 0.30. Hemoglobin is slightly low at 11.1 and is normocytic no white count. Her sedimentation rate in the past was 9 Her urine shows many back. Positive nitrites moderate leukocytes but this was a clean-catch UC is pending  I suspect this lady has either some element of peripheral vertigo-daughter stated she isn't compliant on her vertigo exercises Although she had multiple positive findings on the urinalysis, and not sure this needs to be treated as pyelonephritis vs. just a simple cystitis  Await Physical therapy input  We will the plan for discharge dependent after physical therapy assessment/Vertigo re-teaching   Nita Sells'

## 2014-03-22 NOTE — Progress Notes (Signed)
Utilization review completed.  

## 2014-03-22 NOTE — Evaluation (Signed)
Physical Therapy Evaluation Patient Details Name: [name] [name] MRN: [id number] DOB: 1940/10/31 Today's Date: 03/22/2014   History of Present Illness   73 y.o. female with PVD, HTN, and PMR admitted for UTI and ?acute encephalopathy.    Clinical Impression  Pt presents with severe limitations to functional mobility related to pain in muscles of legs, low back pain radiating to left foot, painful shoulder joints, lethargy and confusion, and overall weakness.  Pt is at HIGH fall risk and high risk for hospital readmission if she is discharged today and I recommend she remain in acute setting until symptoms are resolved and physical function is closer to baseline of independent with assistive device.  Pt may benefit from specialist consultation to explore relationship of muscle pain, edema, weakness and specifically may benefit from imaging of lumbar spine to explore radicular pain report.  PT will establish care plan to begin therapy in acute setting and at this time recommends SNF for post acute needs, though hopeful with further medical care that status will improve and pt could return home instead.    Follow Up Recommendations SNF    Equipment Recommendations  None recommended by PT    Recommendations for Other Services Other (comment) (see clinical impression statement)     Precautions / Restrictions Precautions Precautions: Fall Precaution Comments: lethargy, muscle pain, weakness, dizziness Restrictions Other Position/Activity Restrictions: recommend elevate feet, roll sock tops, encourage ankle pumps for LE edema      Mobility  Bed Mobility Overal bed mobility: Needs Assistance Bed Mobility: Rolling;Sidelying to Sit;Supine to Sit;Sit to Supine Rolling: Min assist Sidelying to sit: Min assist Supine to sit: Min assist Sit to supine: Mod assist   General bed mobility comments: requires physical assist to control speed and minimize pain, assist to raise legs to bed and to use  most efficient techniques  Transfers Overall transfer level: Needs assistance Equipment used: 1 person hand held assist Transfers: Sit to/from Stand Sit to Stand: Min assist         General transfer comment: standby to hands on assist for safety as pt reports legs feel weak and 'gave way when i went to the bathroom but i had the walker'.  side stepping to right with incr effort to shiftt, lift and repostion to advance 3 steps  Ambulation/Gait             General Gait Details: gait deferred due to pain, lethargy and weakness.    Stairs            Wheelchair Mobility    Modified Rankin (Stroke Patients Only)       Balance Overall balance assessment: Needs assistance Sitting-balance support: Feet supported;No upper extremity supported Sitting balance-Leahy Scale: Good Sitting balance - Comments: lethargic and in pain   Standing balance support: During functional activity Standing balance-Leahy Scale: Fair Standing balance comment: leans legs on bed to stand, needs support to wieight shift                             Pertinent Vitals/Pain Pain Assessment: Faces Faces Pain Scale: Hurts whole lot Pain Descriptors / Indicators: Aching;Constant;Cramping;Radiating;Tightness Pain Intervention(s): Limited activity within patient's tolerance;Patient requesting pain meds-RN notified;Utilized relaxation techniques;Repositioned    Home Living Family/patient expects to be discharged to:: Private residence Living Arrangements: Children Available Help at Discharge: Family Type of Home: House Home Access: Stairs to enter Entrance Stairs-Rails: Right Entrance Stairs-Number of Steps: 4 Home Layout: One level  Home Equipment: [place] - 2 wheels Additional Comments: daughter lives with pt 24/7 per patient    Prior Function Level of Independence: Independent with assistive device(s)         Comments: Pt does not drive.  She sponge bathes due to fear of getting  into tub and denies h/o falls      Hand Dominance        Extremity/Trunk Assessment   Upper Extremity Assessment: Defer to OT evaluation           Lower Extremity Assessment: Generalized weakness;LLE deficits/detail   LLE Deficits / Details: pt states radiating pain from low back to foot on left leg, pain in knee with ankle ROM and generally thigh muscle aching     Communication   Communication: HOH  Cognition Arousal/Alertness: Lethargic Behavior During Therapy: Flat affect Overall Cognitive Status: Impaired/Different from baseline Area of Impairment: Attention   Current Attention Level: Sustained                General Comments General comments (skin integrity, edema, etc.): +2 edema bilateral legs, bruising, pain/tightness    Exercises        Assessment/Plan    PT Assessment Patient needs continued PT services  PT Diagnosis Difficulty walking   PT Problem List Decreased strength;Decreased range of motion;Decreased activity tolerance;Decreased mobility;Decreased balance;Decreased cognition;Decreased knowledge of use of DME;Cardiopulmonary status limiting activity;Decreased skin integrity  PT Treatment Interventions DME instruction;Gait training;Stair training;Therapeutic exercise;Therapeutic activities;Functional mobility training;Balance training;Cognitive remediation;Patient/family education   PT Goals (Current goals can be found in the Care Plan section) Acute Rehab PT Goals Patient Stated Goal: feel better, not hurt PT Goal Formulation: With patient/family Time For Goal Achievement: 04/05/14 Potential to Achieve Goals: Fair    Frequency Min 3X/week   Barriers to discharge Inaccessible home environment;Decreased caregiver support 4 steps to enter, and daughter needs patient to be a supervision or better.    Co-evaluation               End of Session   Activity Tolerance: Patient limited by pain;Patient limited by lethargy Patient left: in  bed;with family/visitor present;with call bell/phone within reach;Other (comment) (HOB 30 degrees, feet elevated, heels floated) Nurse Communication: Mobility status;Patient requests pain meds    Functional Assessment Tool Used: clinical judgement Functional Limitation: Mobility: Walking and moving around Mobility: Walking and Moving Around Current Status [phone]): At least 80 percent but less than 100 percent impaired, limited or restricted Mobility: Walking and Moving Around Goal Status [phone]): At least 20 percent but less than 40 percent impaired, limited or restricted    Time: 1032-1103 PT Time Calculation (min): 31 min   Charges:   PT Evaluation $Initial PT Evaluation Tier I: 1 Procedure PT Treatments $Therapeutic Activity: 23-37 mins   PT G Codes:   Functional Assessment Tool Used: clinical judgement Functional Limitation: Mobility: Walking and moving around    Herbie Drape 03/22/2014, 11:14 AM

## 2014-03-23 ENCOUNTER — Observation Stay (HOSPITAL_COMMUNITY): Payer: Medicare HMO

## 2014-03-23 DIAGNOSIS — G9340 Encephalopathy, unspecified: Secondary | ICD-10-CM | POA: Diagnosis not present

## 2014-03-23 DIAGNOSIS — N3000 Acute cystitis without hematuria: Secondary | ICD-10-CM | POA: Diagnosis not present

## 2014-03-23 DIAGNOSIS — R4182 Altered mental status, unspecified: Secondary | ICD-10-CM | POA: Diagnosis not present

## 2014-03-23 DIAGNOSIS — I739 Peripheral vascular disease, unspecified: Secondary | ICD-10-CM | POA: Diagnosis not present

## 2014-03-23 DIAGNOSIS — N390 Urinary tract infection, site not specified: Secondary | ICD-10-CM

## 2014-03-23 LAB — CBC WITH DIFFERENTIAL/PLATELET
Basophils Absolute: 0 10*3/uL (ref 0.0–0.1)
Basophils Relative: 0 % (ref 0–1)
Eosinophils Absolute: 0 10*3/uL (ref 0.0–0.7)
Eosinophils Relative: 0 % (ref 0–5)
HCT: 34.8 % — ABNORMAL LOW (ref 36.0–46.0)
Hemoglobin: 12 g/dL (ref 12.0–15.0)
Lymphocytes Relative: 10 % — ABNORMAL LOW (ref 12–46)
Lymphs Abs: 0.8 10*3/uL (ref 0.7–4.0)
MCH: 33 pg (ref 26.0–34.0)
MCHC: 34.5 g/dL (ref 30.0–36.0)
MCV: 95.6 fL (ref 78.0–100.0)
Monocytes Absolute: 0.8 10*3/uL (ref 0.1–1.0)
Monocytes Relative: 10 % (ref 3–12)
Neutro Abs: 6.9 10*3/uL (ref 1.7–7.7)
Neutrophils Relative %: 80 % — ABNORMAL HIGH (ref 43–77)
Platelets: 248 10*3/uL (ref 150–400)
RBC: 3.64 MIL/uL — ABNORMAL LOW (ref 3.87–5.11)
RDW: 14.4 % (ref 11.5–15.5)
WBC: 8.6 10*3/uL (ref 4.0–10.5)

## 2014-03-23 LAB — BASIC METABOLIC PANEL
Anion gap: 15 (ref 5–15)
BUN: 13 mg/dL (ref 6–23)
CO2: 27 mEq/L (ref 19–32)
Calcium: 9.3 mg/dL (ref 8.4–10.5)
Chloride: 94 mEq/L — ABNORMAL LOW (ref 96–112)
Creatinine, Ser: 1.03 mg/dL (ref 0.50–1.10)
GFR calc Af Amer: 61 mL/min — ABNORMAL LOW (ref >90)
GFR calc non Af Amer: 52 mL/min — ABNORMAL LOW (ref >90)
Glucose, Bld: 132 mg/dL — ABNORMAL HIGH (ref 70–99)
Potassium: 3.5 mEq/L — ABNORMAL LOW (ref 3.7–5.3)
Sodium: 136 mEq/L — ABNORMAL LOW (ref 137–147)

## 2014-03-23 LAB — SEDIMENTATION RATE: Sed Rate: 70 mm/hr — ABNORMAL HIGH (ref 0–22)

## 2014-03-23 LAB — GLUCOSE, CAPILLARY: Glucose-Capillary: 100 mg/dL — ABNORMAL HIGH (ref 70–99)

## 2014-03-23 LAB — TROPONIN I
Troponin I: <0.3 ng/mL (ref <0.30)
Troponin I: <0.3 ng/mL (ref <0.30)

## 2014-03-23 LAB — MAGNESIUM: Magnesium: 1.7 mg/dL (ref 1.5–2.5)

## 2014-03-23 MED ORDER — CEPHALEXIN 500 MG PO CAPS
500.0000 mg | ORAL_CAPSULE | Freq: Four times a day (QID) | ORAL | Status: DC
  Administered 2014-03-23 – 2014-03-24 (×4): 500 mg via ORAL
  Filled 2014-03-23 (×8): qty 1

## 2014-03-23 MED ORDER — NITROGLYCERIN 0.4 MG SL SUBL
SUBLINGUAL_TABLET | SUBLINGUAL | Status: AC
  Filled 2014-03-23: qty 1

## 2014-03-23 MED ORDER — MORPHINE SULFATE 2 MG/ML IJ SOLN: 1.0000 mg | INTRAMUSCULAR | Status: DC | PRN

## 2014-03-23 MED ORDER — NITROGLYCERIN 0.4 MG SL SUBL
0.4000 mg | SUBLINGUAL_TABLET | SUBLINGUAL | Status: DC | PRN
  Administered 2014-03-23: 0.4 mg via SUBLINGUAL

## 2014-03-23 MED ORDER — PREDNISONE 1 MG PO TABS
1.0000 mg | ORAL_TABLET | Freq: Two times a day (BID) | ORAL | Status: DC
  Administered 2014-03-23 – 2014-03-24 (×2): 1 mg via ORAL
  Filled 2014-03-23 (×4): qty 1

## 2014-03-23 MED ORDER — POTASSIUM CHLORIDE CRYS ER 20 MEQ PO TBCR
20.0000 meq | EXTENDED_RELEASE_TABLET | Freq: Once | ORAL | Status: AC
  Administered 2014-03-23: 20 meq via ORAL
  Filled 2014-03-23: qty 1

## 2014-03-23 NOTE — Progress Notes (Signed)
PCP note  [name] is a 73 y/o female admitted for acute encephalopathy due to UTI. On eval by PT she is weak and a high fall risk leading them to recopmmend SNF placement which the patient is amenable to.    She states that she is having leg pain similar to the previous pain she experienced with Polymyalgia rheumatica. She is requesting to go back on prednisone. Consider ESR to help eval, although this is unspecific.   I appreciate and agree with the excellent management by the family practice teaching service.   Laroy Apple, MD [place] Resident, PGY-3 03/23/2014, 10:41 AM

## 2014-03-23 NOTE — Progress Notes (Signed)
Active bed search is in place for SNF bed.  Per MD- patient is medically stable for d/c to SNF. Patient noted to have prior Level 2 PASARR which expired in 2014.  New PASARR request for short term stay requested and awaiting number. Cannot place patient without this auth number.  Also- patient has [employer] which will require prior authorization.  CSW services is currently working on these barriers to SNF placement.  Lorie Phenix. Pauline Good, [place]

## 2014-03-23 NOTE — Progress Notes (Signed)
Family Medicine Teaching Service Daily Progress Note Intern Pager: [phone]  Patient name: [name] [name] record number: [id number] Date of birth: [birth year] Age: 73 y.o. Gender: female  Primary Care Provider: Kenn File, MD Consultants: None Code Status: Full  Pt Overview and Major Events to Date:  9/12 - Admitted for AMS and cystitis  9/13 - Mental status at baseline with improving urinary symptoms   Assessment and Plan:  Uncomplicated Cystitis - Improving urinary symptoms with mild suprapubic tenderness and urinary burning/frequency with no fever or leukocytosis. -Transition IV ceftriaxone to PO cephalexin 500 mg QID (1st dose today) and continue for 4 more days for total 5 day antibiotic course  -E. Coli >100,000 units. Follow up sensitivities.   Constipation - Reports no BM since 1 day ago -Miralax 17g daily  -Avoid narcotic use at this time - stool x 2 yesterday.   Hypomagnesemia - Mg level 1.2 on 03/21/14 -Administer IV 2 g magnesium sulfate -Repeat Mg level 1.7  Hypertension - Mildly hypertensive today (142/67) -Continue home chlorthalidone 12.5 -25 mg daily   Acute Encephalopathy - Resolved. Etiology most likely due to uncomplicated cystitis  AKI on CKD Stage 3 - Resolved. Cr 1.02 today at baseline of 1. Most likely pre-renal azotemia in setting of hypovolemia.   Polymyalgia Rheumatica- Pt with chronic LE weakness and difficulty with ambulation. No new focal neurological deficits on exam. Pt was previously on plaquenil and corticosteroids.   -Awaiting PT consult to determine disposition  - CK 43 level in setting of fenofibrate use  -Continue home tramadol 50 mg Q 6hr PRN pain  and cyclobenzaprine 100 mg TID PRN spasms - Restart home gabapentin 100 mg TID  -ESR 70 and consider rheumatology referral  as outpatient  - Restart home prednisone    BPPV - Currently with no vertigo. Pt not on meclizine at home. -Pt may need outpatient neuro rehabilitation     Osteopenia - Apparent on recent CXR imaging. -Continue home risedronate 150 mg monthly   PVD - s/p RLE stent -Continue home aspirin 81 mg daily and plavix 75 mg daily   Hyperlipidemia - Last lipid panel on 06/27/13 with hypercholesteremia 236 and elevated 138 -Obtain CK level  -Continue home fenofibrate 160 mg daily, consider discontinuing since TG level normal   Chronic Normocytic Anemia  - Pt with Hg 11.1 on 9/12 with baseline 13 with no active bleeding or hemodynamic instability. Pt is s/p hysterectomy. Unclear if has had colonoscopy in past.  -Monitor for bleeding  Depression & Anxiety - Currently with stable mood -Continue home sertraline 150 mg daily   GERD - Currently with no acid reflux symptoms.  -Continue home protonix 20 mg daily   Diet: Heart  DVT PPx: SQ Heparin TID  Disposition: Awaiting PT evaluation, most likely discharge tomorrow to SNF  Subjective:  Pt. With complaint of lower extremity and back pain. Says that she normally takes prednisone daily, and that this helps. She said that otherwise her symptoms of burning and frequency are improving with treatment. She has no other complaints this am.    Objective: Temp:  [98.9 F (37.2 C)-99.5 F (37.5 C)] 99 F (37.2 C) (09/14 0513) Pulse Rate:  [91-94] 91 (09/14 0513) Resp:  [16-17] 17 (09/14 0513) BP: (130-146)/(52-90) 130/52 mmHg (09/14 0513) SpO2:  [92 %-94 %] 92 % (09/14 0513) Weight:  [151 lb 10.8 oz (68.8 kg)] 151 lb 10.8 oz (68.8 kg) (09/14 0657)  Physical Exam: General: Sitting up in bed with no acute  distress Cardiovascular: Normal rate and rhythm  Respiratory: Decreased breath sounds. No rales, wheezing, or rhonchi  Abdomen: Soft, very mild suprapubic tenderness, non-distended, normal BS, no guarding, rigidity, or rebound,  Extremities: No LE edema, chronic venous stasis changes of LE Neuro: A & O x 3, Decreased sensation of left face. Otherwise grossly neurologically intact.     Laboratory:  Recent Labs Lab 03/21/14 1249  WBC 6.6  HGB 11.1*  HCT 32.3*  PLT 236    Recent Labs Lab 03/21/14 1249 03/22/14 0905  NA 136* 135*  K 3.6* 4.1  CL 92* 93*  CO2 34* 27  BUN 19 17  CREATININE 1.31* 1.02  CALCIUM 9.4 9.5  PROT 6.5  --   BILITOT 0.5  --   ALKPHOS 68  --   ALT 12  --   AST 28  --   GLUCOSE 76 150*   CK - 43 ESR - 70    Aquilla Hacker, MD 03/23/2014, 9:56 AM PGY-1 [facility]

## 2014-03-23 NOTE — Progress Notes (Signed)
[facility] ATTENDING NOTE [name] Wegman,MD I  have seen and examined this patient, reviewed their chart. I have discussed this patient with the resident. I agree with the resident's findings, assessment and care plan. Patient doing well, denies any GU symptoms, she is making good urine. She did c/o B/L LL pain and back pain from her fibromyalgia for which she was on Prednisone at home, uncertain exact dose. Plan to switch to oral A/B, consider Keflex for UTI pending culture sensitivity report. Plan to restart on home dose of prednisone for her fibromyalgia.

## 2014-03-23 NOTE — Progress Notes (Signed)
Utilization review completed.  

## 2014-03-23 NOTE — Progress Notes (Signed)
S: Paged by RN about pt. Having Active Chest pain. Went to see the patient. She states that she is having substernal pain radiating to her back. She denies SOB, nausea, arm pain, neck pain, or jaw pain. She says that it is worsened by raising her arms above her head. She says that onset was approximately 30 minutes ago, and that it has been constant.   O:  Filed Vitals:   03/23/14 1340  BP: 128/70  Pulse: 105  Temp: 99.5 F (37.5 C)  Resp: 24  Gen: NAD, AAOx3 CV: Tachycardic, Regulary Rhythm, No murmurs, Gallops, or rubs, specifically no diastolic murmurs noted. TTP diffusely over her chest.  Resp: CTA Bilaterally, No increased WOB, Tachypneic with rate of 22, mild pain with deep inhalation.  Ext: WWP, TTP diffusely in Lower extremities.   A/P: Pt. With new onset CP in the setting of history of peripheral vascular disease. Will plan for rule out ACS at this point. Tachycardic and tachypneic. Will keep PE on the differential.  - Stat EKG - Stat Troponin now and cycle q6 hrs. X 3 - Nitro sublingual  - ASA 81 mg already given today.  - Morphine q1hr. Prn - Stat CXR given concern of CP radiating to back.  - Will continue to follow along.

## 2014-03-23 NOTE — Discharge Summary (Signed)
[facility] Discharge Summary  Patient name: [name] [name] record number: [id number] Date of birth: 28-Mar-1941 Age: 73 y.o. Gender: female Date of Admission: 03/21/2014  Date of Discharge: 03/23/2014 Admitting Physician: Alveda Reasons, MD  Primary Care Provider: Kenn File, MD Consultants: None  Indication for Hospitalization: Acute Cystitis  Discharge Diagnoses/Problem List:  Acute Encephalopathy - Resolved Acute Uncomplicated Cystitis HTN Polymyalgia rheumatica Peripheral Vascular Disease Anxiety and Depression GERD HLD   Disposition: SNF  Discharge Condition: Stable  Discharge Exam:  Filed Vitals:   03/24/14 0626  BP: 116/64  Pulse: 83  Temp: 98.6 F (37 C)  Resp: 18  Physical Exam:  General: Sitting up in bed with no acute distress  Cardiovascular: Normal rate and rhythm  Respiratory: Decreased breath sounds. No rales, wheezing, or rhonchi  Abdomen: Soft, non-tender this am, non-distended, normal BS, no guarding, rigidity, or rebound,  Extremities: No LE edema, chronic venous stasis changes noted of LE  Neuro: A & O x 3, Decreased sensation of left face. Otherwise grossly neurologically intact.   Brief Hospital Course:  #Uncomplicated Cystitis - Initially admitted with altered mental status and urinalysis with evidence of acute cystitis. She demonstrated suprapubic tenderness, burning, and frequency of urination. She was afebrile, and her WBC were not elevated. She was started on IV ceftriaxone with transition to Cephalexin 512m QID for a five day course and stop date of 03/26/14. A culture of her urine grew E.Coli which was found to be pansensitive after starting Cephalexin. Her Altered mental status has resolved, and she has been found to be safe and stable for discharge to SNF.   #Other Problems Addressed During this Hospitalization:   Constipation - Initially pt. Complained of no BM x 2 days. She was given miralax and  had BM x 3 since admission. We have avoided narcotic use in order to avoid inhibition of gastric motility during this hospitalization.   Hypomagnesemia - Initially, her Magnesium level was found to be 1.2. Magnesium was repleted, and was 1.7 prior to discharge.   Hypertension - We continued Chlorthalidone 12.564mBID with mostly good control. May benefit from further outpatient management.   Acute Encephalopathy - Present on admission. Resolved. Etiology most likely due to uncomplicated cystitis   AKI on CKD Stage 3 - Present on admission Cr 1.31. Resolved. Cr 1.02 at baseline of 1 at discharge. Most likely pre-renal azotemia in setting of hypovolemia. Given IVF during this admission, with good results.   Polymyalgia Rheumatica- Pt with chronic LE weakness and difficulty with ambulation. No new focal neurological deficits on exam. Pt was previously on plaquenil and corticosteroids. PT evaluation during this admission recommended SNF for physical therapy and rehab. CK level 43 and ESR 70 during this admission. She was continued on tramadol 5050m6hr prn, and Flexaril 100m95mD, Gabapentin 100mg33m. She was restarted on her home prednisone dose of 1mg Q30mhich according to her helped the most. She may benefit from further outpatient follow up and work up by rheumatology.   Osteopenia - Apparent on recent CXR imaging. Continued on Risedronate 150mg m14mly. No acute changes, fractures, or falls.    Peripheral Vascular Disease - Previous placement of right lower extremity stent. She was continued on her home Aspirin 81mg da80m and her Plavix 75mg dai86m  Hyperlipidemia - Last lipid panel on 06/27/13 with hypercholesteremia 236 and elevated 138. Continued on home fenofibrate 160mg dail74mill need continued outpatient follow up and management.    Chronic  Normocytic Anemia - Pt with Hg 11.1 on 9/12 with baseline 11-13 with no active bleeding or hemodynamic instability. Stable during this admission. May  be related to chronic inflammation given elevated ESR. Pt is s/p hysterectomy. Unclear if has had colonoscopy in past. Outpatient follow up and work up.   Depression & Anxiety - Currently with stable mood. Continued on home sertraline 152m daily   GERD - Currently with no acid reflux symptoms. Continued on protonix 257mdaily.   Issues for Follow Up:  1. Uncomplicated Cystitis - follow up symptom improvement with antibiotics. Continue Keflex QID until 03/26/14.  2. Hypomagnasemia - recheck magnesium  3. Polymyalgia Rheumatica - will need continued rheumatology follow up. ESR elevated here. CK Normal. Continue prednisone per home regimen. 4. Normocytic anemia - Needs outpatient iron studies, and potential colonoscopy, though she is approaching the age cut off. Continue to monitor CBC's.   Significant Procedures: None  Significant Labs and Imaging:   Recent Labs Lab 03/21/14 1249 03/23/14 1000 03/24/14 0509  WBC 6.6 8.6 7.2  HGB 11.1* 12.0 11.5*  HCT 32.3* 34.8* 33.7*  PLT 236 248 219    Recent Labs Lab 03/21/14 1249 03/21/14 1935 03/22/14 0905 03/23/14 1000 03/24/14 0509  NA 136*  --  135* 136* 136*  K 3.6*  --  4.1 3.5* 3.7  CL 92*  --  93* 94* 96  CO2 34*  --  27 27 26   GLUCOSE 76  --  150* 132* 105*  BUN 19  --  17 13 14   CREATININE 1.31*  --  1.02 1.03 1.10  CALCIUM 9.4  --  9.5 9.3 9.0  MG  --  1.2* 1.4* 1.7 1.8  ALKPHOS 68  --   --   --   --   AST 28  --   --   --   --   ALT 12  --   --   --   --   ALBUMIN 3.6  --   --   --   --    Troponins - <0.30 x 3 Sed Rate - 97   CLINICAL DATA: Chest and back pain  EXAM:  PORTABLE CHEST - 1 VIEW  COMPARISON: 03/21/2014  FINDINGS:  Cardiac shadow is stable. The lungs are well aerated with mild  bibasilar atelectatic changes stable from the previous exam. No new  focal abnormality is seen.  IMPRESSION:  No change from the prior study  Electronically Signed  By: MaInez Catalina.D.  On: 03/23/2014 15:06   EKG  9/14 - Sinus Tachycardia, no other acute changes.   Results/Tests Pending at Time of Discharge: None   Discharge Medications:    Medication List    TAKE these medications       aspirin EC 81 MG tablet  Take 81 mg by mouth daily.     cephALEXin 500 MG capsule  Commonly known as:  KEFLEX  Take 1 capsule (500 mg total) by mouth every 12 (twelve) hours.     chlorthalidone 25 MG tablet  Commonly known as:  HYGROTON  Take 25 mg by mouth daily.     clopidogrel 75 MG tablet  Commonly known as:  PLAVIX  Take 1 tablet (75 mg total) by mouth every morning.     fenofibrate 160 MG tablet  Take 1 tablet (160 mg total) by mouth daily.     loxapine 10 MG capsule  Commonly known as:  LOXITANE  Take 10 mg by mouth 2 (two) times  daily.     pantoprazole 20 MG tablet  Commonly known as:  PROTONIX  Take 1 tablet (20 mg total) by mouth daily.     polyethylene glycol packet  Commonly known as:  MIRALAX / GLYCOLAX  Take 17 g by mouth daily.     traMADol 50 MG tablet  Commonly known as:  ULTRAM  Take 1 tablet (50 mg total) by mouth every 6 (six) hours as needed for moderate pain.      ASK your doctor about these medications       BC HEADACHE POWDER PO  Take 1 packet by mouth every 6 (six) hours as needed (pain).     fluticasone 50 MCG/ACT nasal spray  Commonly known as:  FLONASE  Place 1 spray into both nostrils daily as needed for allergies or rhinitis (congestion).  Ask about: Which instructions should I use?     multivitamin with minerals Tabs tablet  Take 1 tablet by mouth daily.     predniSONE 1 MG tablet  Commonly known as:  DELTASONE  Take 1 mg by mouth 2 (two) times daily with a meal.     risedronate 150 MG tablet  Commonly known as:  ACTONEL  Take 150 mg by mouth every 30 (thirty) days. with water on empty stomach, nothing by mouth or lie down for next 30 minutes.  Ask about: Which instructions should I use?     sertraline 100 MG tablet  Commonly known as:  ZOLOFT   Take 100 mg by mouth 2 (two) times daily.  Ask about: Which instructions should I use?     sodium chloride 5 % ophthalmic ointment  Commonly known as:  MURO 628  Place 1 application into both eyes 3 (three) times daily.     SYSTANE OP  Place 1 drop into both eyes 2 (two) times daily.     traZODone 100 MG tablet  Commonly known as:  DESYREL  Take 50 mg by mouth at bedtime.     Vitamin D 2000 UNITS tablet  Take 2,000 Units by mouth daily.        Discharge Instructions: Please refer to Patient Instructions section of EMR for full details.  Patient was counseled important signs and symptoms that should prompt return to medical care, changes in medications, dietary instructions, activity restrictions, and follow up appointments.   Follow-Up Appointments:   Aquilla Hacker, MD 03/24/2014, 1:57 PM PGY-1, [place]

## 2014-03-23 NOTE — Clinical Social Work Placement (Addendum)
    Clinical Social Work Department CLINICAL SOCIAL WORK PLACEMENT NOTE 03/23/2014  Patient:  LYNNIX, SCHONEMAN  Account Number:  [id number] Admit date:  03/21/2014  Clinical Social Worker:  Butch Penny CROWDER, LCSWA  Date/time:  03/23/2014 09:17 PM  Clinical Social Work is seeking post-discharge placement for this patient at the following level of care:   SKILLED NURSING   (*CSW will update this form in Epic as items are completed)   03/22/2014  Patient/family provided with [place] Department of Clinical Social Work's list of facilities offering this level of care within the geographic area requested by the patient (or if unable, by the patient's family).  03/22/2014  Patient/family informed of their freedom to choose among providers that offer the needed level of care, that participate in Medicare, Medicaid or managed care program needed by the patient, have an available bed and are willing to accept the patient.    Patient/family informed of [facility]' ownership interest in [facility], as well as of the fact that they are under no obligation to receive care at this facility.  PASARR submitted to EDS on 03/23/2014 PASARR number received on 03/24/2014  FL2 transmitted to all facilities in geographic area requested by pt/family on  03/23/2014 FL2 transmitted to all facilities within larger geographic area on   Patient informed that his/her managed care company has contracts with or will negotiate with  certain facilities, including the following:   [facility]     Patient/family informed of bed offers received:  03/24/2014 Patient chooses bed at [facility] Physician recommends and patient chooses bed at    Patient to be transferred to09/15/2015  On Dustin Flock Patient to be transferred to facility by ambulance  Corey Harold) Patient and family notified of transfer on 03/24/2014 Name of family member notified:  [name]  The following physician request  were entered in Epic: Physician Request  Please sign FL2.  Please prepare priority discharge summary and prescriptions.    Additional Comments:

## 2014-03-23 NOTE — Progress Notes (Signed)
Called MD after patient complained of chest pain and pain with breathing. Tachycardic and tachypneic. EKG done, positive for sinus tachycardia. Gave one dose of nitro. Patient states pain has almost totally relieved. Will continue to monitor. [name] [name]

## 2014-03-24 LAB — TROPONIN I
Troponin I: <0.3 ng/mL (ref <0.30)
Troponin I: <0.3 ng/mL (ref <0.30)
Troponin I: <0.3 ng/mL (ref <0.30)
Troponin I: <0.3 ng/mL (ref <0.30)

## 2014-03-24 LAB — URINE CULTURE: Colony Count: >=100000

## 2014-03-24 LAB — BASIC METABOLIC PANEL
Anion gap: 14 (ref 5–15)
BUN: 14 mg/dL (ref 6–23)
CO2: 26 mEq/L (ref 19–32)
Calcium: 9 mg/dL (ref 8.4–10.5)
Chloride: 96 mEq/L (ref 96–112)
Creatinine, Ser: 1.1 mg/dL (ref 0.50–1.10)
GFR calc Af Amer: 56 mL/min — ABNORMAL LOW (ref >90)
GFR calc non Af Amer: 48 mL/min — ABNORMAL LOW (ref >90)
Glucose, Bld: 105 mg/dL — ABNORMAL HIGH (ref 70–99)
Potassium: 3.7 mEq/L (ref 3.7–5.3)
Sodium: 136 mEq/L — ABNORMAL LOW (ref 137–147)

## 2014-03-24 LAB — CBC WITH DIFFERENTIAL/PLATELET
Basophils Absolute: 0 10*3/uL (ref 0.0–0.1)
Basophils Relative: 0 % (ref 0–1)
Eosinophils Absolute: 0.1 10*3/uL (ref 0.0–0.7)
Eosinophils Relative: 1 % (ref 0–5)
HCT: 33.7 % — ABNORMAL LOW (ref 36.0–46.0)
Hemoglobin: 11.5 g/dL — ABNORMAL LOW (ref 12.0–15.0)
Lymphocytes Relative: 18 % (ref 12–46)
Lymphs Abs: 1.3 10*3/uL (ref 0.7–4.0)
MCH: 32.2 pg (ref 26.0–34.0)
MCHC: 34.1 g/dL (ref 30.0–36.0)
MCV: 94.4 fL (ref 78.0–100.0)
Monocytes Absolute: 1.1 10*3/uL — ABNORMAL HIGH (ref 0.1–1.0)
Monocytes Relative: 15 % — ABNORMAL HIGH (ref 3–12)
Neutro Abs: 4.8 10*3/uL (ref 1.7–7.7)
Neutrophils Relative %: 66 % (ref 43–77)
Platelets: 219 10*3/uL (ref 150–400)
RBC: 3.57 MIL/uL — ABNORMAL LOW (ref 3.87–5.11)
RDW: 14.5 % (ref 11.5–15.5)
WBC: 7.2 10*3/uL (ref 4.0–10.5)

## 2014-03-24 LAB — GLUCOSE, CAPILLARY: Glucose-Capillary: 120 mg/dL — ABNORMAL HIGH (ref 70–99)

## 2014-03-24 LAB — MAGNESIUM: Magnesium: 1.8 mg/dL (ref 1.5–2.5)

## 2014-03-24 LAB — SEDIMENTATION RATE: Sed Rate: 97 mm/hr — ABNORMAL HIGH (ref 0–22)

## 2014-03-24 MED ORDER — CEPHALEXIN 500 MG PO CAPS: 500.0000 mg | ORAL_CAPSULE | Freq: Four times a day (QID) | ORAL | Status: DC

## 2014-03-24 MED ORDER — CEPHALEXIN 500 MG PO CAPS: 500.0000 mg | ORAL_CAPSULE | Freq: Four times a day (QID) | ORAL | Status: AC

## 2014-03-24 NOTE — Discharge Summary (Signed)
FMTS ATTENDING  NOTE Shae Augello,MD I  have seen and examined this patient, reviewed their chart. I have discussed this patient with the resident. I agree with the resident's findings, assessment and care plan. 

## 2014-03-24 NOTE — Progress Notes (Signed)
Physical Therapy Treatment Patient Details Name: [name] [name] MRN: [id number] DOB: [birth year] Today's Date: 03/24/2014    History of Present Illness 73 y.o. female with PVD, HTN, and PMR admitted for UTI and ?acute encephalopathy    PT Comments    Pt pleasant but limited by pain. Pt with longstanding (20+ years ) of back pain with LLE sciatica but reports current LLE pain as feeling as though the entire leg will explode from pressure, no significant edema at this time. Pt educated for transfers, mobility, bed mobility to decrease back strain and HEP. Will continue to follow to maximize mobility, strength and function and continue to support SNF.   Follow Up Recommendations  SNF     Equipment Recommendations       Recommendations for Other Services       Precautions / Restrictions Precautions Precautions: Fall Precaution Comments: lethargy, muscle pain, weakness Restrictions Weight Bearing Restrictions: No    Mobility  Bed Mobility Overal bed mobility: Needs Assistance Bed Mobility: Rolling;Sidelying to Sit Rolling: Min assist Sidelying to sit: Min assist       General bed mobility comments: cues for sequence, assist for trunk elevation from surface and increased time. Reciprocal scooting and use of pad with min assist   Transfers Overall transfer level: Needs assistance   Transfers: Sit to/from Stand;Stand Pivot Transfers Sit to Stand: Min assist Stand pivot transfers: Min assist       General transfer comment: cues for hand placement, left knee blocked and assist for anterior translation and powering up with increased time to achieve erect posture. Pivot with cues for sequence and use of RW with bil UE support to unweight LLE and decrease pain  Ambulation/Gait Ambulation/Gait assistance:  (pt deferred attempting due to LLE pain)               Stairs            Wheelchair Mobility    Modified Rankin (Stroke Patients Only)       Balance  Overall balance assessment: Needs assistance   Sitting balance-Leahy Scale: Fair       Standing balance-Leahy Scale: Poor                      Cognition Arousal/Alertness: Awake/alert Behavior During Therapy: WFL for tasks assessed/performed Overall Cognitive Status: Within Functional Limits for tasks assessed                      Exercises General Exercises - Lower Extremity Ankle Circles/Pumps: AROM;Seated;Both;15 reps Short Arc Quad: AROM;Seated;Both;15 reps Hip ABduction/ADduction: AROM;Seated;Both;15 reps Hip Flexion/Marching: AAROM;AROM;Seated;Right;Left;15 reps (AAROM on LLE)    General Comments        Pertinent Vitals/Pain Pain Score: 5  Pain Location: left LE, back and left shoulder Pain Descriptors / Indicators: Aching Pain Intervention(s): Repositioned;Limited activity within patient's tolerance;Patient requesting pain meds-RN notified    Home Living                      Prior Function            PT Goals (current goals can now be found in the care plan section) Progress towards PT goals: Progressing toward goals (slowly)    Frequency       PT Plan Current plan remains appropriate    Co-evaluation             End of Session Equipment Utilized During Treatment: Gait belt Activity Tolerance:  Patient limited by pain Patient left: in chair;with call bell/phone within reach;with chair alarm set     Time: [phone] PT Time Calculation (min): 29 min  Charges:  $Therapeutic Exercise: 8-22 mins $Therapeutic Activity: 8-22 mins                    G Codes:      Melford Aase 04-11-2014, 9:09 AM Elwyn Reach, [place]

## 2014-03-24 NOTE — Progress Notes (Addendum)
CSW Armed forces technical officer) spoke with pt and pt daughter and provided with bed offers. They have accepted bed at [facility]. Facility requesting pt daughter to complete paperwork. Pt daughter aware and agreeable. CSW notified MD and requested dc summary as soon as possible. CSW also notified insurance representative on facility choice.  ADDENDUM: CSW (Clinical Education officer, museum) received [employer] [phone]. CSW prepared pt dc packet and placed with shadow chart. CSW arranged non-emergent ambulance transport for 4:15pm. Pt, pt family, pt nurse, and facility informed. CSW signing off.   [place], [place]

## 2014-03-24 NOTE — Discharge Instructions (Signed)
-  Start taking keflex 500 mg twice a day starting tonight at 10PM for 4 days for your urinary tract infection -Take miralax daily as needed for constipation -Please follow-up with Dr. Wendi Snipes in 1 week   Urinary Tract Infection A urinary tract infection (UTI) can occur any place along the urinary tract. The tract includes the kidneys, ureters, bladder, and urethra. A type of germ called bacteria often causes a UTI. UTIs are often helped with antibiotic medicine.  HOME CARE   If given, take antibiotics as told by your doctor. Finish them even if you start to feel better.  Drink enough fluids to keep your pee (urine) clear or pale yellow.  Avoid tea, drinks with caffeine, and bubbly (carbonated) drinks.  Pee often. Avoid holding your pee in for a long time.  Pee before and after having sex (intercourse).  Wipe from front to back after you poop (bowel movement) if you are a woman. Use each tissue only once. GET HELP RIGHT AWAY IF:   You have back pain.  You have lower belly (abdominal) pain.  You have chills.  You feel sick to your stomach (nauseous).  You throw up (vomit).  Your burning or discomfort with peeing does not go away.  You have a fever.  Your symptoms are not better in 3 days. MAKE SURE YOU:   Understand these instructions.  Will watch your condition.  Will get help right away if you are not doing well or get worse. Document Released: 12/13/2007 Document Revised: 03/20/2012 Document Reviewed: 01/25/2012 [facility] Patient Information 2015 [place], [place]. This information is not intended to replace advice given to you by your health care provider. Make sure you discuss any questions you have with your health care provider.   Ms. Rody was admitted for Uncomplicated Cystitis.   She will need to continue her antibiotic Keflex until 9/17. She will take the antibiotic 500mg  4 times per day.   For further discharge follow up recommendations please see the  discharge summary attached.   Thanks for helping take care of our patient!   Paula Compton, MD Family Medicine PGY 1

## 2014-03-25 NOTE — ED Provider Notes (Signed)
Medical screening examination/treatment/procedure(s) were performed by non-physician practitioner and as supervising physician I was immediately available for consultation/collaboration.   EKG Interpretation   Date/Time:  Saturday March 21 2014 12:08:56 EDT Ventricular Rate:  79 PR Interval:  147 QRS Duration: 104 QT Interval:  394 QTC Calculation: 452 R Axis:   65 Text Interpretation:  Sinus rhythm Baseline wander When compared with ECG  of 10/04/2013 No significant change was found Confirmed by [facility]  MD,  Trula Frede ([id number]) on 03/21/2014 12:25:38 PM        Francine Graven, DO 03/25/14 [id number]

## 2014-03-31 ENCOUNTER — Telehealth: Payer: Self-pay | Admitting: Family Medicine

## 2014-03-31 NOTE — Telephone Encounter (Signed)
Daughter called because her mother [name] [name] was sent to Rehab and needs a refill and letter sent to the rehab center stating what antibiotics she is on for this. Please fax this to [phone] attention [place]. If you have any questions please call the daughter [name] [name]. Blima Rich

## 2014-04-01 NOTE — Telephone Encounter (Signed)
I have printed the DC summary which clearly states he is on Keflex 500 TID until 9/17, I will have  This faxed.   She was on the antibiotics for an acute illness and so does not need any additional antibiotics (refill) at this time.   Any medication management will have to be managed by the physician at the nursing home.   Laroy Apple, MD [place] Resident, PGY-3 04/01/2014, 11:01 AM

## 2014-04-07 ENCOUNTER — Ambulatory Visit: Payer: Commercial Managed Care - HMO | Admitting: Family Medicine

## 2014-04-14 ENCOUNTER — Encounter (HOSPITAL_BASED_OUTPATIENT_CLINIC_OR_DEPARTMENT_OTHER): Payer: Commercial Managed Care - HMO

## 2014-04-16 ENCOUNTER — Telehealth: Payer: Self-pay | Admitting: Family Medicine

## 2014-04-16 NOTE — Telephone Encounter (Signed)
[place]  [phone] Needs protein supplement. Pt was advised to get this from the rehab facility.

## 2014-04-17 MED ORDER — ENSURE COMPLETE PO LIQD: 237.0000 mL | Freq: Two times a day (BID) | ORAL | Status: DC

## 2014-04-17 NOTE — Telephone Encounter (Signed)
Sent Rx for ensure. Insurance may not cover this as there is no clear medical need for it as she has normal albumin on last check and I cannot Dx protein calorie malnutrition.   Sent, will ask staff to inform.   Laroy Apple, MD [place] Resident, PGY-3 04/17/2014, 5:08 PM

## 2014-04-20 ENCOUNTER — Telehealth: Payer: Self-pay | Admitting: *Deleted

## 2014-04-20 NOTE — Telephone Encounter (Signed)
Covering for Dr. Wendi Snipes. Called PT back and provided verbal order. Routed to Dr. Juliette Mangle. --CMS

## 2014-04-20 NOTE — Telephone Encounter (Signed)
[name] [name], Physical Therapist with [name] need a verbal for physical therapy twice week for 4 weeks and once week for 1 week.  Please give him a call at [phone].

## 2014-04-21 ENCOUNTER — Telehealth: Payer: Self-pay | Admitting: Family Medicine

## 2014-04-21 NOTE — Telephone Encounter (Signed)
[name] [name] called with his plan of care for patient. He would 1 time a week for 2 weeks to work on home exercise, training, and energy conservation. He can take verbal orders please call him at [phone]. jw

## 2014-04-22 NOTE — Telephone Encounter (Signed)
Called back to give verbal orders, covering for Dr. Wendi Snipes. --CMS

## 2014-05-08 ENCOUNTER — Encounter: Payer: Self-pay | Admitting: Family Medicine

## 2014-05-08 ENCOUNTER — Ambulatory Visit (INDEPENDENT_AMBULATORY_CARE_PROVIDER_SITE_OTHER): Payer: Commercial Managed Care - HMO | Admitting: Family Medicine

## 2014-05-08 VITALS — BP 139/78 | HR 113 | Temp 98.2°F | Ht 60.0 in | Wt 151.0 lb

## 2014-05-08 DIAGNOSIS — C4491 Basal cell carcinoma of skin, unspecified: Secondary | ICD-10-CM

## 2014-05-08 DIAGNOSIS — M353 Polymyalgia rheumatica: Secondary | ICD-10-CM

## 2014-05-08 DIAGNOSIS — Z23 Encounter for immunization: Secondary | ICD-10-CM

## 2014-05-08 DIAGNOSIS — I10 Essential (primary) hypertension: Secondary | ICD-10-CM

## 2014-05-08 MED ORDER — TRAZODONE HCL 100 MG PO TABS: 50.0000 mg | ORAL_TABLET | Freq: Every day | ORAL | Status: DC

## 2014-05-08 MED ORDER — SERTRALINE HCL 100 MG PO TABS: 100.0000 mg | ORAL_TABLET | Freq: Two times a day (BID) | ORAL | Status: DC

## 2014-05-08 MED ORDER — PREDNISONE 10 MG PO TABS: 10.0000 mg | ORAL_TABLET | Freq: Every day | ORAL | Status: DC

## 2014-05-08 NOTE — Patient Instructions (Signed)
Great to see you!  Lets follow up in about 2-3 months  You should be contacted for a rheumatology appointment

## 2014-05-08 NOTE — Addendum Note (Signed)
Addended by: Levert Feinstein F on: 05/08/2014 04:36 PM   Modules accepted: Orders

## 2014-05-08 NOTE — Assessment & Plan Note (Signed)
No red flags Controlled Chlorthalidone refill Consider thiazide labs

## 2014-05-08 NOTE — Progress Notes (Signed)
Patient ID: [name] [name], female   DOB: Feb 19, 1941, 73 y.o.   MRN: [id number]   HPI  Patient presents today for hospital follow up  Pt was admitted for UTI, acute encephalopathy. She was Dc'd to SNF and has now been at home for a few weeks (thiord week now)  She was found to have elevated ESR in hospital with characteristic pains of PMR, started on gabapemtin   HTN Taking meds, needs  Refills No chest pain, dyspnea, or leg edema.   Basal cell- scheduled for Moh's at [facility] on nov 16th.   Smoking status noted ROS: Per HPI  Objective: BP 139/78  Pulse 113  Temp(Src) 98.2 F (36.8 C) (Oral)  Ht 5' (1.524 m)  Wt 151 lb (68.493 kg)  BMI 29.49 kg/m2 Gen: NAD, alert, cooperative with exam HEENT: NCAT CV: RRR, good S1/S2, no murmur Resp: CTABL, no wheezes, non-labored Abd: SNTND, BS present, no guarding or organomegaly Ext: No edema, warm Neuro: Alert and oriented, No gross deficits  Assessment and plan:  Hypertension No red flags Controlled Chlorthalidone refill Consider thiazide labs   Polymyalgia rheumatica El;evated ESr and previous PMR  Responding well to low dose steroids Refer to rheum for additional advice as prednisone is not helping her osteopenia Will plan to Tx for 6 months and taper off.   Basal cell carcinoma Now has Moh's shceduled.     Orders Placed This Encounter  Procedures  . CBC with Differential  . Comprehensive metabolic panel  . Ambulatory referral to Rheumatology    Referral Priority:  Routine    Referral Type:  Consultation    Referral Reason:  Specialty Services Required    Requested Specialty:  Rheumatology    Number of Visits Requested:  1    Meds ordered this encounter  Medications  . predniSONE (DELTASONE) 10 MG tablet    Sig: Take 1 tablet (10 mg total) by mouth daily with breakfast.    Dispense:  30 tablet    Refill:  3  . traZODone (DESYREL) 100 MG tablet    Sig: Take 0.5 tablets (50 mg total) by mouth at bedtime.      Dispense:  30 tablet    Refill:  11  . sertraline (ZOLOFT) 100 MG tablet    Sig: Take 1 tablet (100 mg total) by mouth 2 (two) times daily.    Dispense:  60 tablet    Refill:  11

## 2014-05-08 NOTE — Assessment & Plan Note (Signed)
Now has Moh's shceduled.

## 2014-05-08 NOTE — Assessment & Plan Note (Signed)
El;evated ESr and previous PMR  Responding well to low dose steroids Refer to rheum for additional advice as prednisone is not helping her osteopenia Will plan to Tx for 6 months and taper off.

## 2014-05-09 LAB — COMPREHENSIVE METABOLIC PANEL
ALT: 17 U/L (ref 0–35)
AST: 27 U/L (ref 0–37)
Albumin: 4.1 g/dL (ref 3.5–5.2)
Alkaline Phosphatase: 68 U/L (ref 39–117)
BUN: 18 mg/dL (ref 6–23)
CO2: 29 mEq/L (ref 19–32)
Calcium: 9.2 mg/dL (ref 8.4–10.5)
Chloride: 95 mEq/L — ABNORMAL LOW (ref 96–112)
Creat: 1.17 mg/dL — ABNORMAL HIGH (ref 0.50–1.10)
Glucose, Bld: 98 mg/dL (ref 70–99)
Potassium: 3.8 mEq/L (ref 3.5–5.3)
Sodium: 134 mEq/L — ABNORMAL LOW (ref 135–145)
Total Bilirubin: 0.4 mg/dL (ref 0.2–1.2)
Total Protein: 6.5 g/dL (ref 6.0–8.3)

## 2014-05-09 LAB — CBC WITH DIFFERENTIAL/PLATELET
Basophils Absolute: 0 10*3/uL (ref 0.0–0.1)
Basophils Relative: 0 % (ref 0–1)
Eosinophils Absolute: 0 10*3/uL (ref 0.0–0.7)
Eosinophils Relative: 0 % (ref 0–5)
HCT: 37.9 % (ref 36.0–46.0)
Hemoglobin: 13.2 g/dL (ref 12.0–15.0)
Lymphocytes Relative: 11 % — ABNORMAL LOW (ref 12–46)
Lymphs Abs: 1.2 10*3/uL (ref 0.7–4.0)
MCH: 31.9 pg (ref 26.0–34.0)
MCHC: 34.8 g/dL (ref 30.0–36.0)
MCV: 91.5 fL (ref 78.0–100.0)
Monocytes Absolute: 0.3 10*3/uL (ref 0.1–1.0)
Monocytes Relative: 3 % (ref 3–12)
Neutro Abs: 9.7 10*3/uL — ABNORMAL HIGH (ref 1.7–7.7)
Neutrophils Relative %: 86 % — ABNORMAL HIGH (ref 43–77)
Platelets: 261 10*3/uL (ref 150–400)
RBC: 4.14 MIL/uL (ref 3.87–5.11)
RDW: 14.7 % (ref 11.5–15.5)
WBC: 11.3 10*3/uL — ABNORMAL HIGH (ref 4.0–10.5)

## 2014-05-11 ENCOUNTER — Encounter: Payer: Self-pay | Admitting: Family Medicine

## 2014-05-18 ENCOUNTER — Telehealth: Payer: Self-pay | Admitting: *Deleted

## 2014-05-18 NOTE — Telephone Encounter (Signed)
Please call Hinton Dyer from Dr. Astrid Drafts office [facility] regarding a referral that needs to placed for the patient.  Please give her a call at [phone] ext [phone].  Derl Barrow, RN

## 2014-05-19 NOTE — Telephone Encounter (Signed)
Left message for [name] [name] to call me back.

## 2014-05-19 NOTE — Telephone Encounter (Signed)
Left another message for Hinton Dyer to return my call.

## 2014-05-19 NOTE — Telephone Encounter (Signed)
Will ask team to call.   Laroy Apple, MD [place] Resident, PGY-3 05/19/2014, 9:30 AM

## 2014-05-26 ENCOUNTER — Other Ambulatory Visit: Payer: Self-pay | Admitting: Rheumatology

## 2014-05-26 DIAGNOSIS — M5442 Lumbago with sciatica, left side: Secondary | ICD-10-CM

## 2014-05-26 DIAGNOSIS — M5410 Radiculopathy, site unspecified: Secondary | ICD-10-CM

## 2014-05-29 ENCOUNTER — Ambulatory Visit
Admission: RE | Admit: 2014-05-29 | Discharge: 2014-05-29 | Disposition: A | Discharge status: Home / Self Care | Payer: Commercial Managed Care - HMO | Source: Ambulatory Visit | Attending: Rheumatology | Admitting: Rheumatology

## 2014-05-29 DIAGNOSIS — M5442 Lumbago with sciatica, left side: Secondary | ICD-10-CM

## 2014-05-29 DIAGNOSIS — M5410 Radiculopathy, site unspecified: Secondary | ICD-10-CM

## 2014-06-15 ENCOUNTER — Other Ambulatory Visit: Payer: Self-pay | Admitting: Rheumatology

## 2014-06-15 DIAGNOSIS — M5416 Radiculopathy, lumbar region: Secondary | ICD-10-CM

## 2014-06-15 DIAGNOSIS — M5126 Other intervertebral disc displacement, lumbar region: Secondary | ICD-10-CM

## 2014-06-25 ENCOUNTER — Ambulatory Visit
Admission: RE | Admit: 2014-06-25 | Discharge: 2014-06-25 | Disposition: A | Discharge status: Home / Self Care | Payer: Commercial Managed Care - HMO | Source: Ambulatory Visit | Attending: Rheumatology | Admitting: Rheumatology

## 2014-06-25 DIAGNOSIS — M5126 Other intervertebral disc displacement, lumbar region: Secondary | ICD-10-CM

## 2014-06-25 DIAGNOSIS — M5416 Radiculopathy, lumbar region: Secondary | ICD-10-CM

## 2014-06-25 MED ORDER — METHYLPREDNISOLONE ACETATE 40 MG/ML INJ SUSP (RADIOLOG
120.0000 mg | Freq: Once | INTRAMUSCULAR | Status: AC
  Administered 2014-06-25: 120 mg via EPIDURAL

## 2014-06-25 MED ORDER — IOHEXOL 180 MG/ML  SOLN
1.0000 mL | Freq: Once | INTRAMUSCULAR | Status: AC | PRN
  Administered 2014-06-25: 1 mL via EPIDURAL

## 2014-06-25 NOTE — Discharge Instructions (Signed)

## 2014-06-29 ENCOUNTER — Encounter: Payer: Self-pay | Admitting: Family Medicine

## 2014-06-29 ENCOUNTER — Ambulatory Visit (HOSPITAL_BASED_OUTPATIENT_CLINIC_OR_DEPARTMENT_OTHER): Payer: Medicare HMO | Attending: Family Medicine | Admitting: Radiology

## 2014-06-29 VITALS — Ht 60.0 in | Wt 145.0 lb

## 2014-06-29 DIAGNOSIS — M810 Age-related osteoporosis without current pathological fracture: Secondary | ICD-10-CM | POA: Insufficient documentation

## 2014-06-29 DIAGNOSIS — G4733 Obstructive sleep apnea (adult) (pediatric): Secondary | ICD-10-CM | POA: Insufficient documentation

## 2014-06-29 DIAGNOSIS — I739 Peripheral vascular disease, unspecified: Secondary | ICD-10-CM | POA: Insufficient documentation

## 2014-06-29 DIAGNOSIS — R0683 Snoring: Secondary | ICD-10-CM | POA: Diagnosis not present

## 2014-06-29 DIAGNOSIS — E041 Nontoxic single thyroid nodule: Secondary | ICD-10-CM | POA: Insufficient documentation

## 2014-06-29 DIAGNOSIS — R921 Mammographic calcification found on diagnostic imaging of breast: Secondary | ICD-10-CM | POA: Insufficient documentation

## 2014-06-29 DIAGNOSIS — G4710 Hypersomnia, unspecified: Secondary | ICD-10-CM | POA: Diagnosis present

## 2014-07-05 DIAGNOSIS — R0683 Snoring: Secondary | ICD-10-CM

## 2014-07-05 DIAGNOSIS — G4733 Obstructive sleep apnea (adult) (pediatric): Secondary | ICD-10-CM

## 2014-07-05 NOTE — Sleep Study (Signed)
   NAME: [name] [name] DATE OF BIRTH:  [birth year] MEDICAL RECORD NUMBER [id number]  LOCATION: [facility]  PHYSICIAN: Lakoda Mcanany D  DATE OF STUDY: 06/29/2014  SLEEP STUDY TYPE: Nocturnal Polysomnogram               REFERRING PHYSICIAN: Timmothy Euler, MD  INDICATION FOR STUDY: Hypersomnia with sleep apnea  EPWORTH SLEEPINESS SCORE:   1/24 HEIGHT: 5' (152.4 cm)  WEIGHT: 145 lb (65.772 kg)    Body mass index is 28.32 kg/(m^2).  NECK SIZE: 14 in.  MEDICATIONS: Charted for review  SLEEP ARCHITECTURE: Split study protocol. During the diagnostic phase, total sleep time 136.5 minutes with sleep efficiency 94.8%. Stage I was 2.6%, stage II 97.4%, stage III and REM were absent. Sleep latency 3 minutes, awake after sleep onset 4 minutes, arousal index 67.7, bedtime medication: None  RESPIRATORY DATA: Apnea hypopnea index (AHI) 78.2 per hour. 178 total events scored including 110 obstructive apneas and 68 hypopneas. All events were while supine. CPAP was titrated to 17 CWP with residual AHI 26.8 per hour. She was then changed to bilevel (BiPAP) and titrated to a final respiratory pressure of 22 and expiratory pressure of 16 CWP. Residual AHI of 51.9 reflected persistent obstructive as well as central events. She wore an extra small fullface mask.  OXYGEN DATA: Moderate snoring before CPAP with oxygen desaturation to a nadir of 82%. With CPAP/BiPAP titration, mean oxygen saturation was 92.7% on room air. Snoring was prevented.  CARDIAC DATA: Sinus rhythm with PACs and PVCs  MOVEMENT/PARASOMNIA: No significant movement disturbance, no bathroom trips  IMPRESSION/ RECOMMENDATION:   1) Severe obstructive sleep apnea/hypopnea syndrome, AHI 78.2 per hour with supine events. Moderate snoring with oxygen desaturation to a nadir of 82% on room air. 2) Incomplete control with CPAP titration to 17 CWP and then bilevel (BiPAP) to final inspiratory pressure of 22 and expiratory  pressure 16 CWP, residual AHI 51.9 per hour. There were persistent obstructive and central events noted at final titration pressures but snoring was prevented. Oxygen saturation was at 92.7% with PAP. She wore a Veterinary surgeon fullface mask, size xtra small. 3) Appearance of some central apneas is not uncommon with titration and is usually of little medical significance. Persistent obstructive events at these final pressures may indicate significant upper airway anatomic obstruction for which ENT evaluation might be a consideration if appropriate.   Deneise Lever Diplomate, American Board of Sleep Medicine  ELECTRONICALLY SIGNED ON:  07/05/2014, 10:15 AM [place] PH: [phone]   FX: [phone] [place]

## 2014-08-03 ENCOUNTER — Other Ambulatory Visit: Payer: Self-pay | Admitting: Family Medicine

## 2014-08-10 ENCOUNTER — Ambulatory Visit (INDEPENDENT_AMBULATORY_CARE_PROVIDER_SITE_OTHER): Payer: PPO | Admitting: Family Medicine

## 2014-08-10 ENCOUNTER — Encounter: Payer: Self-pay | Admitting: Family Medicine

## 2014-08-10 VITALS — BP 130/79 | HR 84 | Temp 98.3°F | Ht 60.0 in | Wt 149.0 lb

## 2014-08-10 DIAGNOSIS — K219 Gastro-esophageal reflux disease without esophagitis: Secondary | ICD-10-CM

## 2014-08-10 DIAGNOSIS — I10 Essential (primary) hypertension: Secondary | ICD-10-CM

## 2014-08-10 DIAGNOSIS — M4722 Other spondylosis with radiculopathy, cervical region: Secondary | ICD-10-CM

## 2014-08-10 DIAGNOSIS — R0683 Snoring: Secondary | ICD-10-CM

## 2014-08-10 DIAGNOSIS — M353 Polymyalgia rheumatica: Secondary | ICD-10-CM

## 2014-08-10 DIAGNOSIS — Z23 Encounter for immunization: Secondary | ICD-10-CM

## 2014-08-10 MED ORDER — GABAPENTIN 100 MG PO CAPS: 200.0000 mg | ORAL_CAPSULE | Freq: Three times a day (TID) | ORAL | Status: DC

## 2014-08-10 MED ORDER — FLUTICASONE PROPIONATE 50 MCG/ACT NA SUSP
1.0000 | Freq: Every day | NASAL | Status: DC | PRN
Start: 2014-08-10 — End: 2016-10-26

## 2014-08-10 MED ORDER — PANTOPRAZOLE SODIUM 20 MG PO TBEC: 20.0000 mg | DELAYED_RELEASE_TABLET | Freq: Every day | ORAL | Status: DC

## 2014-08-10 NOTE — Patient Instructions (Signed)
Great to see you!  Please come back in 3 months

## 2014-08-10 NOTE — Assessment & Plan Note (Signed)
As obstructive sleep apnea per sleep study in December Our patient does not have a Pap machine at home Will follow-up with sleep center and try to acquire machine for patient, she does have a mask

## 2014-08-10 NOTE — Assessment & Plan Note (Signed)
Gabapentin helping some, would like to increase dose Discussed instructions to increase up to 300 mg 3 times a day New prescription written

## 2014-08-10 NOTE — Assessment & Plan Note (Signed)
Unlikely to be PMR this time due to nonresponse to steroids Patient has already stopped steroids, discontinue

## 2014-08-10 NOTE — Progress Notes (Addendum)
Patient ID: [name] [name], female   DOB: Jun 01, 1941, 74 y.o.   MRN: [id number]   HPI  Patient presents today for follow-up hypertension  Hypertension Taking meds as prescribed No chest pain, dyspnea, palpitations  Basal cell carcinoma Recently taken off with Mohs excision, had postop infection and finishing course of antibiotics tomorrow.  Snoring, sleepiness Had recent sleep study that showed OSA. Has not gotten a machine but does have a mask. Trazodone helping  Muscle aches and pains Previously started on prednisone for presumed PMR, tried  2 two week courses of prednisone with no improvement and has stopped over the last month. Muscle aches and pains continue  Smoking status noted ROS: Per HPI  Objective: BP 130/79 mmHg  Pulse 84  Temp(Src) 98.3 F (36.8 C) (Oral)  Ht 5' (1.524 m)  Wt 149 lb (67.586 kg)  BMI 29.10 kg/m2 Gen: NAD, alert, cooperative with exam HEENT: NCAT, approximate 1 cm eschar on crown of head with no surrounding erythema or induration to indicate infection, eschar covered with yellow tissue CV: RRR, good S1/S2, no murmur Resp: CTABL, no wheezes, non-labored Ext: Trace to 1+ bilateral pitting edema Neuro: Alert and oriented, No gross deficits  Assessment and plan:  Hypertension Well controlled on chlorthalidone Continue Follow-up 3 months   Polymyalgia rheumatica Unlikely to be PMR this time due to nonresponse to steroids Patient has already stopped steroids, discontinue   Snoring As obstructive sleep apnea per sleep study in December Our patient does not have a Pap machine at home Will follow-up with sleep center and try to acquire machine for patient, she does have a mask   Cervical spondylosis with radiculopathy Gabapentin helping some, would like to increase dose Discussed instructions to increase up to 300 mg 3 times a day New prescription written    Meds ordered this encounter  Medications  . gabapentin (NEURONTIN) 100 MG  capsule    Sig: Take 2 capsules (200 mg total) by mouth 3 (three) times daily.    Dispense:  180 capsule    Refill:  5  . pantoprazole (PROTONIX) 20 MG tablet    Sig: Take 1 tablet (20 mg total) by mouth daily.    Dispense:  90 tablet    Refill:  3  . fluticasone (FLONASE) 50 MCG/ACT nasal spray    Sig: Place 1 spray into both nostrils daily as needed for allergies or rhinitis (congestion).    Dispense:  16 g    Refill:  11

## 2014-08-10 NOTE — Assessment & Plan Note (Signed)
Well controlled on chlorthalidone Continue Follow-up 3 months

## 2014-08-11 ENCOUNTER — Telehealth: Payer: Self-pay | Admitting: Family Medicine

## 2014-08-11 NOTE — Telephone Encounter (Signed)
Dr.Hecker's office called and they need a silverback before Friday since the patient has an appointment then. Please call them at [phone] if you have any questions. jw

## 2014-08-12 NOTE — Telephone Encounter (Signed)
Auth obtained for [employer] #[id number]

## 2014-08-16 ENCOUNTER — Ambulatory Visit (INDEPENDENT_AMBULATORY_CARE_PROVIDER_SITE_OTHER): Payer: PPO | Admitting: Internal Medicine

## 2014-08-16 ENCOUNTER — Ambulatory Visit (INDEPENDENT_AMBULATORY_CARE_PROVIDER_SITE_OTHER): Payer: PPO

## 2014-08-16 VITALS — BP 116/68 | HR 93 | Temp 98.8°F | Resp 20 | Ht 60.0 in | Wt 156.4 lb

## 2014-08-16 DIAGNOSIS — M25521 Pain in right elbow: Secondary | ICD-10-CM

## 2014-08-16 DIAGNOSIS — G252 Other specified forms of tremor: Secondary | ICD-10-CM

## 2014-08-16 NOTE — Progress Notes (Signed)
Subjective:  This chart was scribed for Tami Lin, MD by Dellis Filbert, ED Scribe at [facility].The patient was seen in exam room 09 and the patient's care was started at 4:09 PM.   Patient ID: [name] [name], female    DOB: [birth year], 74 y.o.   MRN: [id number] Chief Complaint  Patient presents with  . Arm Pain    right arm weakness   HPI  HPI Comments: [name] [name] is a 74 y.o. female who presents to [facility] complaining of right arm weakness.  Unsure of trauma, resting her arm does not cause pain but lifting her arm above her chest causes pain. Her daughter states the arm is mildly swollen. She has intermittent resting tremors and they have gradually worsened. She became tired and her legs became weak. She has not changed her medications, and no new issues with her current medical problems. She does have a history of gout.  Patient Active Problem List   Diagnosis Date Noted  . Osteoporosis 06/29/2014  . Thyroid nodule 06/29/2014  . Breast calcifications 06/29/2014  . UTI (lower urinary tract infection) 03/21/2014  . Bilateral edema of lower extremity 03/21/2014  . Altered mental status 03/21/2014  . Polyuria 03/04/2014  . Snoring 03/04/2014  . Glaucoma 01/08/2014  . PND (post-nasal drip) 12/23/2013  . Trigeminal neuralgia 12/18/2013  . Ear pain, left 12/16/2013  . Basal cell carcinoma 11/12/2013  . Depression 10/22/2013  . Presbyopia 10/22/2013  . Resting tremor 10/14/2013  . Hearing loss 10/13/2013  . Dizziness 10/05/2013  . Dysmetria 10/04/2013  . Low back pain 09/11/2013  . Polymyalgia rheumatica 09/11/2013  . GERD (gastroesophageal reflux disease) 09/11/2013  . Vulvar irritation 09/11/2013  . Non-healing ulcer 09/11/2013  . Rash and nonspecific skin eruption 07/28/2013  . Pulmonary nodules 05/24/2012  . DOE (dyspnea on exertion) 05/24/2012  . PVD (peripheral vascular disease) 04/30/2012  . Carotid bruit 04/30/2012  . Dyspnea 04/30/2012  .  Hypertension 04/27/2012  . Chest pain 04/27/2012  . Anxiety and depression 04/27/2012  . Hyperlipemia 04/27/2012  . Cervical spondylosis with radiculopathy 04/19/2012   Past Medical History  Diagnosis Date  . Hypertension   . Heart murmur   . Pneumonia   . Anxiety   . Depression   . Psoriasis   . Arthritis   . Hypercholesteremia   . Congenital absence of one kidney     Pt has right kidney only  . Fibromyalgia   . Carpal tunnel syndrome, bilateral   . Chronic kidney disease     CKD stage III, absence of left kidney  . Peripheral vascular disease     Dr. Benjamine Sprague; right external iliac stent '06 with angioplasty '13  . Polymyalgia rheumatica    Past Surgical History  Procedure Laterality Date  . Abdominal hysterectomy      Partial   . Appendectomy    . Tonsillectomy    . Eye surgery    . Iliac artery stent    . Anterior cervical decomp/discectomy fusion  04/19/2012    Procedure: ANTERIOR CERVICAL DECOMPRESSION/DISCECTOMY FUSION 2 LEVELS;  Surgeon: Winfield Cunas, MD;  Location: [facility];  Service: Neurosurgery;  Laterality: N/A;  Cervical four-five,Cervical five-six anterior cervical decompression with fusion plating and bonegraft possible posterior cervical decompression  . Carpal tunnel release  1990   Allergies  Allergen Reactions  . Cyclobenzaprine Itching    Possible reaction per pt  . Gabapentin Itching  . Other Other (See Comments)  Cholesterol medications cause muscle pain  . Statins Swelling and Rash    Swelling involving tongue   Prior to Admission medications   Medication Sig Start Date End Date Taking? Authorizing Provider  aspirin EC 81 MG tablet Take 81 mg by mouth daily.    Yes Historical Provider, MD  Aspirin-Salicylamide-Caffeine (BC HEADACHE POWDER PO) Take 1 packet by mouth every 6 (six) hours as needed (pain).   Yes Historical Provider, MD  chlorthalidone (HYGROTON) 25 MG tablet TAKE ONE TABLET BY MOUTH ONCE DAILY 08/04/14  Yes Timmothy Euler,  MD  Cholecalciferol (VITAMIN D) 2000 UNITS tablet Take 2,000 Units by mouth daily.   Yes Historical Provider, MD  clopidogrel (PLAVIX) 75 MG tablet Take 1 tablet (75 mg total) by mouth every morning. 12/23/13  Yes Timmothy Euler, MD  feeding supplement, ENSURE COMPLETE, (ENSURE COMPLETE) LIQD Take 237 mLs by mouth 2 (two) times daily between meals. 04/17/14  Yes Timmothy Euler, MD  fenofibrate 160 MG tablet Take 1 tablet (160 mg total) by mouth daily. 07/28/13  Yes Timmothy Euler, MD  fluticasone (FLONASE) 50 MCG/ACT nasal spray Place 1 spray into both nostrils daily as needed for allergies or rhinitis (congestion). 08/10/14  Yes Timmothy Euler, MD  gabapentin (NEURONTIN) 100 MG capsule Take 2 capsules (200 mg total) by mouth 3 (three) times daily. 08/10/14  Yes Timmothy Euler, MD  loxapine (LOXITANE) 10 MG capsule Take 10 mg by mouth 2 (two) times daily.    Yes Historical Provider, MD  Multiple Vitamin (MULTIVITAMIN WITH MINERALS) TABS tablet Take 1 tablet by mouth daily.   Yes Historical Provider, MD  pantoprazole (PROTONIX) 20 MG tablet Take 1 tablet (20 mg total) by mouth daily. 08/10/14  Yes Timmothy Euler, MD  Polyethyl Glycol-Propyl Glycol (SYSTANE OP) Place 1 drop into both eyes 2 (two) times daily.   Yes Historical Provider, MD  polyethylene glycol (MIRALAX / GLYCOLAX) packet Take 17 g by mouth daily. 03/22/14  Yes Juluis Mire, MD  risedronate (ACTONEL) 150 MG tablet Take 150 mg by mouth every 30 (thirty) days. with water on empty stomach, nothing by mouth or lie down for next 30 minutes.   Yes Historical Provider, MD  sertraline (ZOLOFT) 100 MG tablet Take 1 tablet (100 mg total) by mouth 2 (two) times daily. 05/08/14  Yes Timmothy Euler, MD  sodium chloride (MURO 128) 5 % ophthalmic ointment Place 1 application into both eyes 3 (three) times daily.   Yes Historical Provider, MD  traMADol (ULTRAM) 50 MG tablet Take 1 tablet (50 mg total) by mouth every 6 (six) hours as needed  for moderate pain. 12/23/13  Yes Timmothy Euler, MD  traZODone (DESYREL) 100 MG tablet Take 0.5 tablets (50 mg total) by mouth at bedtime. 05/08/14  Yes Timmothy Euler, MD   Review of Systems  Musculoskeletal: Positive for joint swelling and arthralgias.  Neurological: Positive for weakness.      Objective:   Physical Exam  Constitutional: She is oriented to person, place, and time. She appears well-developed and well-nourished. No distress.  HENT:  Head: Normocephalic and atraumatic.  Eyes: Pupils are equal, round, and reactive to light.  Neck: Normal range of motion.  Cardiovascular: Normal rate and regular rhythm.   Pulmonary/Chest: Effort normal. No respiratory distress.  Musculoskeletal: Normal range of motion.  Swelling around the right elbow without redness and marked tenderness over the lateral epicondial ROM of the elbow is reduced  Pain on supnation, prontaion  and resisted movement The wrist is intact, grip is decrease due to pain in the elbow.  Neurological: She is alert and oriented to person, place, and time.  She has tremors with movement in both upper and lower extremities. There is no hyperreflexia. The tremors are more obvious at rest.  Skin: Skin is warm and dry.  Psychiatric: She has a normal mood and affect. Her behavior is normal.  Nursing note and vitals reviewed. [facility] reading (PRIMARY) by  Dr. Laney Pastor there is an avulsion at the lateral upper condyle that appears to be old and stable and no other acute changes about the elbow      Assessment & Plan:    I have completed the patient encounter in its entirety as documented by the scribe, with editing by me where necessary. Kimetha Trulson P. Laney Pastor, M.D.

## 2014-08-17 ENCOUNTER — Encounter: Payer: Self-pay | Admitting: Family Medicine

## 2014-08-17 ENCOUNTER — Ambulatory Visit (INDEPENDENT_AMBULATORY_CARE_PROVIDER_SITE_OTHER): Payer: PPO | Admitting: Family Medicine

## 2014-08-17 VITALS — BP 134/84 | HR 93 | Temp 98.9°F | Ht 60.0 in | Wt 157.3 lb

## 2014-08-17 DIAGNOSIS — R404 Transient alteration of awareness: Secondary | ICD-10-CM

## 2014-08-17 DIAGNOSIS — M25521 Pain in right elbow: Secondary | ICD-10-CM

## 2014-08-17 NOTE — Patient Instructions (Signed)
I am referring you to sports medicine for your elbow pain. You will get a phone call to schedule this appointment.  Use tylenol as needed for pain Go to ER if worsening symptoms or more confusion  Be well, Dr. Ardelia Mems

## 2014-08-18 ENCOUNTER — Other Ambulatory Visit: Payer: Self-pay | Admitting: Rheumatology

## 2014-08-18 DIAGNOSIS — M5126 Other intervertebral disc displacement, lumbar region: Secondary | ICD-10-CM

## 2014-08-18 NOTE — Progress Notes (Signed)
Patient ID: [name] [name], female   DOB: 1940-09-26, 74 y.o.   MRN: [id number]  HPI:  Pt presents for a same day appointment to discuss right arm pain.  Patient reports that 2 nights ago she bumped her right elbow. She bumped it more in the distal humerus region, but now the lateral aspect of her right elbow is sore and swollen. She is unable to bend or flex it very well. She was seen at [facility] yesterday and had x-rays done which did not show any acute findings. She is now having pain up to her right palm. The elbow is not getting any better.  Her daughter accompanies her today and states that she was worried yesterday about her mother's mental status. She seemed a little bit confused, and was thinking that were quite making sense. She has cleared since then. They also note that she is had shaking recently, and are concerned about Parkinson's disease. She does have a history of an intention tremor which is chronic.  ROS: See HPI  [place]: Cervical spondylosis, anxiety/depression, hyperlipidemia, tremor, glaucoma, trigeminal neuralgia, GERD, basal cell carcinoma, peripheral vascular disease, osteoporosis, polymyalgia rheumatica  PHYSICAL EXAM: BP 134/84 mmHg  Pulse 93  Temp(Src) 98.9 F (37.2 C) (Oral)  Ht 5' (1.524 m)  Wt 157 lb 4.8 oz (71.351 kg)  BMI 30.72 kg/m2 Gen: No acute distress, pleasant, cooperative HEENT: Normocephalic, atraumatic Heart: Regular rate and rhythm, no murmurs Lungs: Clear to auscultation bilaterally, normal respiratory effort Neuro: Cranial nerves II through XII tested and intact. Speech normal. Alert and oriented to person, place, situation, day of week, month, and year. Left-sided dysmetria present, has chronic intention tremor. Sensation intact to all extremities. Extremities: Decreased grip strength on right hand. Right elbow quite tender to palpation. No warmth or erythema, no appreciable effusion. Active and passive range of motion significantly decreased secondary  to pain. 2+ radial pulse on the right. Sensation on hand intact. Brisk capillary refill.  ASSESSMENT/PLAN:  1. Right elbow pain: Etiology not clear to me. No obvious gout as the joint is not warm or erythematous. Had negative x-rays yesterday. She is in significant pain, and it is limiting her use of the arm. I will refer her to sports medicine for further evaluation, may benefit from utilization of a musculoskeletal ultrasound for diagnostic purposes. Recommend Tylenol for pain.  2. Transient altered mental status: Has since cleared. Suspect this may have been related to pain from her arm. Neurological exam is nonfocal today, with the exception of decreased use of the right arm secondary to pain, and left-sided dysmetria, which is chronic. Discussed with daughter that I am not concerned for acute etiology such as stroke or TIA at this time. She will continue to monitor mental status and report to the emergency room if has any new or worsening symptoms.  FOLLOW UP: Referring to sports medicine for evaluation. Instructed patient and daughter to follow-up with PCP Dr. Wendi Snipes to consider evaluation for Parkinson's.  [place]. Ardelia Mems, [place]

## 2014-08-18 NOTE — Progress Notes (Signed)
Reviewed

## 2014-08-19 ENCOUNTER — Ambulatory Visit (INDEPENDENT_AMBULATORY_CARE_PROVIDER_SITE_OTHER): Payer: PPO | Admitting: Sports Medicine

## 2014-08-19 ENCOUNTER — Encounter: Payer: Self-pay | Admitting: Sports Medicine

## 2014-08-19 VITALS — BP 119/75 | HR 93 | Ht 59.0 in | Wt 150.0 lb

## 2014-08-19 DIAGNOSIS — M25521 Pain in right elbow: Secondary | ICD-10-CM

## 2014-08-19 DIAGNOSIS — S5001XA Contusion of right elbow, initial encounter: Secondary | ICD-10-CM

## 2014-08-19 NOTE — Progress Notes (Signed)
  [name] [name] - 74 y.o. female MRN [id number]  Date of birth: [birth year]  SUBJECTIVE: CC: Right elbow pain HPI: 5 day history of right lateral elbow pain that began the day after striking her arm on the jewelry box. Pain over the lateral epicondyles as well as radial head. She was seen by Dr. Melina Copa at urgent care as well as Dr. Ardelia Mems at family practice. She is having difficulty with terminal extension and pain with grip strength. She denies any significant elbow problems prior to this injury that occurred. X-rays at the time are as below and read as negative.  ROS: Denies fevers, chills significant numbness or tingling in right upper extremity. She does have associated strength weakness.  HISTORY:  Past Medical, Surgical, Social, and Family History reviewed & updated per EMR.  Pertinent Historical Findings include:  reports that she quit smoking about 4 years ago. Her smoking use included Cigarettes. She has a 40 pack-year smoking history. She has never used smokeless tobacco. Multiple medical problems. Not on anticoagulation. On Actonel for osteoporosis Tramadol daily  Historical Data Reviewed: X-rays right elbow 08/16/2014: Is a small cortical irregularity over the radial head/neck but there is significant chondrocalcinosis throughout the entire elbow. Spurring at the olecranon as well as minimally at the lateral condyle. Initially read as no fracture and no overt fracture lines are appreciated.   OBJECTIVE:  VS:   HT:4\' 11"  (149.9 cm)   WT:150 lb (68.04 kg)  BMI:30.4          BP:119/75 mmHg  HR:93bpm  TEMP: ( )  RESP:   PHYSICAL EXAM:  GENERAL: Adult Caucasian elderly female in no acute distress she is alert and appropriately interactive. She has moderate insight. Bilateral upper extremities overall well aligned. Radial pulses 2+/4.Upper extremity strength is 5+/5 in all myotomes; sensation is intact to light touch in all dermatomes.  RIGHT ELBOW: TTP over the lateral condyle, marked  TTP over the radial head. Loss of terminal extension by 5-7 compared to the left. Loss of supination by 15. Pain with resisted supination more than resisted wrist extension.  DATA OBTAINED DURING VISIT: LIMITED MSK ULTRASOUND OF RIGHT ELBOW: Findings:  Cortical irregularity and hypoechoic changes over the lateral aspect of the radial head. She does have hypoechoic change at the insertion of the extensor tendons but is less tender there than over the focal bony irregularity of the radial neck. Impression: Likely nondisplaced radial neck fracture  ASSESSMENT: 1. Elbow pain, right   2. Elbow contusion, right, initial encounter    Likely nondisplaced radial head fracture with contusion of the lateral condyle  PLAN: See problem based charting & AVS for additional documentation.  Patient to use sling for comfort only for the next 1 week. After this she should come out and during this initial week she should be working on gentle range of [place] exercises.  Pain control with regularly scheduled tramadol and addition of extra strength acetaminophen scheduled.  Order placed for repeat x-ray prior to follow-up visit in 3 weeks. > Return in about 3 weeks (around 09/09/2014) for Reevaluation, repeat x-rays prior to visit.

## 2014-08-21 ENCOUNTER — Ambulatory Visit: Payer: PPO | Admitting: Family Medicine

## 2014-08-24 ENCOUNTER — Other Ambulatory Visit: Payer: PPO

## 2014-08-25 ENCOUNTER — Encounter: Payer: Self-pay | Admitting: Radiology

## 2014-08-25 ENCOUNTER — Ambulatory Visit
Admission: RE | Admit: 2014-08-25 | Discharge: 2014-08-25 | Disposition: A | Discharge status: Home / Self Care | Payer: PPO | Source: Ambulatory Visit | Attending: Rheumatology | Admitting: Rheumatology

## 2014-08-25 DIAGNOSIS — M5126 Other intervertebral disc displacement, lumbar region: Secondary | ICD-10-CM

## 2014-08-25 MED ORDER — IOHEXOL 180 MG/ML  SOLN
1.0000 mL | Freq: Once | INTRAMUSCULAR | Status: AC | PRN
  Administered 2014-08-25: 1 mL via EPIDURAL

## 2014-08-25 MED ORDER — METHYLPREDNISOLONE ACETATE 40 MG/ML INJ SUSP (RADIOLOG
120.0000 mg | Freq: Once | INTRAMUSCULAR | Status: AC
  Administered 2014-08-25: 120 mg via EPIDURAL

## 2014-08-25 NOTE — Discharge Instructions (Signed)

## 2014-08-31 ENCOUNTER — Other Ambulatory Visit: Payer: Self-pay

## 2014-08-31 DIAGNOSIS — Z1231 Encounter for screening mammogram for malignant neoplasm of breast: Secondary | ICD-10-CM

## 2014-09-04 ENCOUNTER — Ambulatory Visit
Admission: RE | Admit: 2014-09-04 | Discharge: 2014-09-04 | Disposition: A | Discharge status: Home / Self Care | Payer: PPO | Source: Ambulatory Visit

## 2014-09-04 DIAGNOSIS — Z1231 Encounter for screening mammogram for malignant neoplasm of breast: Secondary | ICD-10-CM

## 2014-09-09 ENCOUNTER — Ambulatory Visit: Payer: PPO | Admitting: Sports Medicine

## 2014-09-18 ENCOUNTER — Telehealth: Payer: Self-pay | Admitting: Family Medicine

## 2014-09-18 DIAGNOSIS — G4733 Obstructive sleep apnea (adult) (pediatric): Secondary | ICD-10-CM

## 2014-09-18 NOTE — Telephone Encounter (Signed)
Pt called and wanted to know when the doctor would send in the CPAP machine and the solution. She only has the mask and the sleep study was done in December 2015. jw

## 2014-09-21 ENCOUNTER — Other Ambulatory Visit: Payer: Self-pay | Admitting: *Deleted

## 2014-09-21 DIAGNOSIS — G4733 Obstructive sleep apnea (adult) (pediatric): Secondary | ICD-10-CM | POA: Insufficient documentation

## 2014-09-21 NOTE — Telephone Encounter (Signed)
Message sent to [facility], with [facility] to contact patient.

## 2014-09-22 MED ORDER — TRAMADOL HCL 50 MG PO TABS: 50.0000 mg | ORAL_TABLET | Freq: Four times a day (QID) | ORAL | Status: DC | PRN

## 2014-09-22 NOTE — Telephone Encounter (Signed)
Called in with 0 refills but 270 pills, needs appt.   Laroy Apple, MD [place] Resident, PGY-3 09/22/2014, 12:39 PM

## 2014-09-22 NOTE — Addendum Note (Signed)
Addended by: Timmothy Euler on: 09/22/2014 01:55 PM   Modules accepted: Orders

## 2014-09-22 NOTE — Telephone Encounter (Signed)
Wrote order viewable from [facility].   Laroy Apple, MD [place] Resident, PGY-3 09/22/2014, 1:54 PM

## 2014-10-01 ENCOUNTER — Encounter: Payer: Self-pay | Admitting: Family Medicine

## 2014-10-01 NOTE — Progress Notes (Signed)
Patient ID: [name] [name], female   DOB: 1940-12-05, 74 y.o.   MRN: [id number] Orders were written per the sleep study. If there is need of equipment change, and [facility] knows that, then they should bring appropriate equipment.   I will be glad to sign orders from [facility] concernijng this subject.   I am not a CPAP expert and cannot determine which machine they need.  [name] Apple, MD [place] Resident, PGY-3 10/01/2014, 1:52 PM

## 2014-10-01 NOTE — Progress Notes (Signed)
Pt daughter came in and said that the the "breathing machine" is giving her too much according to the "man that sells it", she needs one prescribed that gives out less air. If there are any questions about changing the prescription for [name] [name] the man's name is [name] [name] and he can be reached at [phone]. On the note he wrote for the daughter to give the PCP it says, "[employer] 5 to 25 PS6" / thanks [employer], ASA

## 2014-10-12 NOTE — Progress Notes (Signed)
Spoke with pt and she was going to get re-fitted today for a new mask. Deseree Kennon Holter, CMA

## 2014-10-14 ENCOUNTER — Encounter: Payer: Self-pay | Admitting: Family Medicine

## 2014-10-14 ENCOUNTER — Ambulatory Visit (INDEPENDENT_AMBULATORY_CARE_PROVIDER_SITE_OTHER): Payer: PPO | Admitting: Family Medicine

## 2014-10-14 VITALS — BP 122/76 | HR 97 | Temp 98.0°F | Ht 59.0 in | Wt 147.3 lb

## 2014-10-14 DIAGNOSIS — R002 Palpitations: Secondary | ICD-10-CM

## 2014-10-14 DIAGNOSIS — G4733 Obstructive sleep apnea (adult) (pediatric): Secondary | ICD-10-CM

## 2014-10-14 DIAGNOSIS — I739 Peripheral vascular disease, unspecified: Secondary | ICD-10-CM

## 2014-10-14 DIAGNOSIS — I10 Essential (primary) hypertension: Secondary | ICD-10-CM | POA: Diagnosis not present

## 2014-10-14 NOTE — Assessment & Plan Note (Signed)
Well controlled on chlorthalidone Continue

## 2014-10-14 NOTE — Progress Notes (Signed)
Patient ID: [name] [name], female   DOB: 1941-01-31, 74 y.o.   MRN: [id number]   HPI  Patient presents today for F/u HTN and OSA  HAs been using CPAP now for several weeks, States it fits well but she is feeling as sleepy as evr, maybe worse Only able to rest for about 4 hours with machine, has lots of anxiety anout not being ble to tolerate it well Trying to wear very regularly but not feeling like it is helping  HTN Checking BP- avg 120s/70s No chest pain, headaches, dyspnea (currently) + palpitations feeling like a skipped beat with dizziness and dyspnea  They are not EXERTIONAL, No loc  Skin cancer - site of basal cell on head bothering her, feels like it may have something sticking out No bleeding  PVD- seen Dr. Maryjean Morn at [facility] recently, states he has new blockage in abd and he wants to watch and wait  Smoking status noted ROS: Per HPI  Objective: BP 122/76 mmHg  Pulse 97  Temp(Src) 98 F (36.7 C) (Oral)  Ht 4\' 11"  (1.499 m)  Wt 147 lb 4.8 oz (66.815 kg)  BMI 29.74 kg/m2 Gen: NAD, alert, cooperative with exam HEENT: NCAT, Scab/heme crusting at sight of previous MOH's excision, no erythema or drainage  CV: RRR, good S1/S2, no murmur Resp: CTABL, no wheezes, non-labored Abd: SNTND, BS present, no guarding or organomegaly Ext: trace BL LE edema, warm Neuro: Alert and oriented, No gross deficits, normal gait and speech  Assessment and plan:  Palpitations Symptomatic palpitations, non exertional CV exam with RRR, no murmur today Refer to cardiology for further eval   OSA (obstructive sleep apnea) Now using CPAP, feels mask fits well Does not feel rested after use, and having worsened difficulty sleeping Some concern for anatomical issues from sleep study in Dec 2015 Given complexity of situation and increased sleepiness with use, and patient's concern, will ask pulmonology for further eccs May also warrant eval by ENT    Hypertension Well controlled on  chlorthalidone Continue    PVD (peripheral vascular disease) States Dr. Maryjean Morn found new blockage, requested records today    Orders Placed This Encounter  Procedures  . Ambulatory referral to Pulmonology    Referral Priority:  Routine    Referral Type:  Consultation    Referral Reason:  Specialty Services Required    Requested Specialty:  Pulmonary Disease    Number of Visits Requested:  1  . Ambulatory referral to Cardiology    Referral Priority:  Routine    Referral Type:  Consultation    Referral Reason:  Specialty Services Required    Requested Specialty:  Cardiology    Number of Visits Requested:  1

## 2014-10-14 NOTE — Assessment & Plan Note (Signed)
States Dr. Maryjean Morn found new blockage, requested records today

## 2014-10-14 NOTE — Assessment & Plan Note (Signed)
Symptomatic palpitations, non exertional CV exam with RRR, no murmur today Refer to cardiology for further eval

## 2014-10-14 NOTE — Assessment & Plan Note (Signed)
Now using CPAP, feels mask fits well Does not feel rested after use, and having worsened difficulty sleeping Some concern for anatomical issues from sleep study in Dec 2015 Given complexity of situation and increased sleepiness with use, and patient's concern, will ask pulmonology for further eccs May also warrant eval by ENT

## 2014-10-14 NOTE — Patient Instructions (Addendum)
Lets see you back in 3 months  You look very good and I think things are going good.   I have written some referrals, one for your palpitations (cardiology) and one for your complicated sleep apnea situation (pulmonology) You should hear fromthem soon.

## 2014-10-16 ENCOUNTER — Other Ambulatory Visit: Payer: Self-pay

## 2014-10-16 ENCOUNTER — Emergency Department (HOSPITAL_COMMUNITY): Payer: PPO

## 2014-10-16 ENCOUNTER — Emergency Department (HOSPITAL_COMMUNITY)
Admission: EM | Admit: 2014-10-16 | Discharge: 2014-10-16 | Disposition: A | Discharge status: Home / Self Care | Payer: PPO | Source: Home / Self Care | Attending: Emergency Medicine | Admitting: Emergency Medicine

## 2014-10-16 DIAGNOSIS — Z7982 Long term (current) use of aspirin: Secondary | ICD-10-CM | POA: Insufficient documentation

## 2014-10-16 DIAGNOSIS — Z8639 Personal history of other endocrine, nutritional and metabolic disease: Secondary | ICD-10-CM

## 2014-10-16 DIAGNOSIS — Z7902 Long term (current) use of antithrombotics/antiplatelets: Secondary | ICD-10-CM | POA: Insufficient documentation

## 2014-10-16 DIAGNOSIS — N183 Chronic kidney disease, stage 3 (moderate): Secondary | ICD-10-CM | POA: Insufficient documentation

## 2014-10-16 DIAGNOSIS — H9190 Unspecified hearing loss, unspecified ear: Secondary | ICD-10-CM | POA: Diagnosis present

## 2014-10-16 DIAGNOSIS — Z7951 Long term (current) use of inhaled steroids: Secondary | ICD-10-CM

## 2014-10-16 DIAGNOSIS — E876 Hypokalemia: Secondary | ICD-10-CM | POA: Diagnosis present

## 2014-10-16 DIAGNOSIS — M810 Age-related osteoporosis without current pathological fracture: Secondary | ICD-10-CM | POA: Diagnosis present

## 2014-10-16 DIAGNOSIS — D696 Thrombocytopenia, unspecified: Secondary | ICD-10-CM | POA: Diagnosis present

## 2014-10-16 DIAGNOSIS — Z888 Allergy status to other drugs, medicaments and biological substances status: Secondary | ICD-10-CM

## 2014-10-16 DIAGNOSIS — N390 Urinary tract infection, site not specified: Secondary | ICD-10-CM | POA: Insufficient documentation

## 2014-10-16 DIAGNOSIS — I739 Peripheral vascular disease, unspecified: Secondary | ICD-10-CM | POA: Diagnosis present

## 2014-10-16 DIAGNOSIS — J209 Acute bronchitis, unspecified: Secondary | ICD-10-CM | POA: Insufficient documentation

## 2014-10-16 DIAGNOSIS — Z872 Personal history of diseases of the skin and subcutaneous tissue: Secondary | ICD-10-CM

## 2014-10-16 DIAGNOSIS — R112 Nausea with vomiting, unspecified: Secondary | ICD-10-CM | POA: Diagnosis not present

## 2014-10-16 DIAGNOSIS — Z8701 Personal history of pneumonia (recurrent): Secondary | ICD-10-CM

## 2014-10-16 DIAGNOSIS — F419 Anxiety disorder, unspecified: Secondary | ICD-10-CM | POA: Diagnosis present

## 2014-10-16 DIAGNOSIS — R109 Unspecified abdominal pain: Secondary | ICD-10-CM

## 2014-10-16 DIAGNOSIS — J40 Bronchitis, not specified as acute or chronic: Secondary | ICD-10-CM

## 2014-10-16 DIAGNOSIS — Z87891 Personal history of nicotine dependence: Secondary | ICD-10-CM

## 2014-10-16 DIAGNOSIS — I129 Hypertensive chronic kidney disease with stage 1 through stage 4 chronic kidney disease, or unspecified chronic kidney disease: Secondary | ICD-10-CM | POA: Diagnosis present

## 2014-10-16 DIAGNOSIS — K219 Gastro-esophageal reflux disease without esophagitis: Secondary | ICD-10-CM | POA: Diagnosis present

## 2014-10-16 DIAGNOSIS — R0602 Shortness of breath: Secondary | ICD-10-CM

## 2014-10-16 DIAGNOSIS — Z95828 Presence of other vascular implants and grafts: Secondary | ICD-10-CM

## 2014-10-16 DIAGNOSIS — M797 Fibromyalgia: Secondary | ICD-10-CM | POA: Insufficient documentation

## 2014-10-16 DIAGNOSIS — A047 Enterocolitis due to Clostridium difficile: Secondary | ICD-10-CM | POA: Diagnosis not present

## 2014-10-16 DIAGNOSIS — M353 Polymyalgia rheumatica: Secondary | ICD-10-CM | POA: Diagnosis present

## 2014-10-16 DIAGNOSIS — Z8669 Personal history of other diseases of the nervous system and sense organs: Secondary | ICD-10-CM | POA: Insufficient documentation

## 2014-10-16 DIAGNOSIS — E860 Dehydration: Secondary | ICD-10-CM | POA: Diagnosis present

## 2014-10-16 DIAGNOSIS — Z79899 Other long term (current) drug therapy: Secondary | ICD-10-CM

## 2014-10-16 DIAGNOSIS — R011 Cardiac murmur, unspecified: Secondary | ICD-10-CM

## 2014-10-16 DIAGNOSIS — Z85828 Personal history of other malignant neoplasm of skin: Secondary | ICD-10-CM

## 2014-10-16 DIAGNOSIS — E875 Hyperkalemia: Secondary | ICD-10-CM | POA: Diagnosis not present

## 2014-10-16 DIAGNOSIS — G629 Polyneuropathy, unspecified: Secondary | ICD-10-CM | POA: Diagnosis present

## 2014-10-16 DIAGNOSIS — Z90710 Acquired absence of both cervix and uterus: Secondary | ICD-10-CM

## 2014-10-16 DIAGNOSIS — Z905 Acquired absence of kidney: Secondary | ICD-10-CM | POA: Diagnosis present

## 2014-10-16 DIAGNOSIS — E785 Hyperlipidemia, unspecified: Secondary | ICD-10-CM | POA: Diagnosis present

## 2014-10-16 DIAGNOSIS — G4733 Obstructive sleep apnea (adult) (pediatric): Secondary | ICD-10-CM | POA: Diagnosis present

## 2014-10-16 DIAGNOSIS — F418 Other specified anxiety disorders: Secondary | ICD-10-CM | POA: Diagnosis present

## 2014-10-16 LAB — URINALYSIS, ROUTINE W REFLEX MICROSCOPIC
Bilirubin Urine: NEGATIVE
Glucose, UA: NEGATIVE mg/dL
Hgb urine dipstick: NEGATIVE
Ketones, ur: NEGATIVE mg/dL
Nitrite: POSITIVE — AB
Protein, ur: NEGATIVE mg/dL
Specific Gravity, Urine: 1.011 (ref 1.005–1.030)
Urobilinogen, UA: 0.2 mg/dL (ref 0.0–1.0)
pH: 7 (ref 5.0–8.0)

## 2014-10-16 LAB — CBC
HCT: 38.5 % (ref 36.0–46.0)
Hemoglobin: 13.6 g/dL (ref 12.0–15.0)
MCH: 33.8 pg (ref 26.0–34.0)
MCHC: 35.3 g/dL (ref 30.0–36.0)
MCV: 95.8 fL (ref 78.0–100.0)
Platelets: 193 10*3/uL (ref 150–400)
RBC: 4.02 MIL/uL (ref 3.87–5.11)
RDW: 13.6 % (ref 11.5–15.5)
WBC: 7.6 10*3/uL (ref 4.0–10.5)

## 2014-10-16 LAB — HEPATIC FUNCTION PANEL
ALT: 15 U/L (ref 0–35)
AST: 27 U/L (ref 0–37)
Albumin: 4.1 g/dL (ref 3.5–5.2)
Alkaline Phosphatase: 73 U/L (ref 39–117)
Bilirubin, Direct: 0.1 mg/dL (ref 0.0–0.5)
Indirect Bilirubin: 0.8 mg/dL (ref 0.3–0.9)
Total Bilirubin: 0.9 mg/dL (ref 0.3–1.2)
Total Protein: 7.4 g/dL (ref 6.0–8.3)

## 2014-10-16 LAB — URINE MICROSCOPIC-ADD ON

## 2014-10-16 LAB — BASIC METABOLIC PANEL
Anion gap: 13 (ref 5–15)
BUN: 14 mg/dL (ref 6–23)
CO2: 25 mmol/L (ref 19–32)
Calcium: 9.7 mg/dL (ref 8.4–10.5)
Chloride: 99 mmol/L (ref 96–112)
Creatinine, Ser: 1.17 mg/dL — ABNORMAL HIGH (ref 0.50–1.10)
GFR calc Af Amer: 51 mL/min — ABNORMAL LOW (ref >90)
GFR calc non Af Amer: 44 mL/min — ABNORMAL LOW (ref >90)
Glucose, Bld: 95 mg/dL (ref 70–99)
Potassium: 3 mmol/L — ABNORMAL LOW (ref 3.5–5.1)
Sodium: 137 mmol/L (ref 135–145)

## 2014-10-16 LAB — I-STAT TROPONIN, ED: Troponin i, poc: 0 ng/mL (ref 0.00–0.08)

## 2014-10-16 LAB — LIPASE, BLOOD: Lipase: 22 U/L (ref 11–59)

## 2014-10-16 LAB — TROPONIN I: Troponin I: <0.03 ng/mL (ref <0.031)

## 2014-10-16 MED ORDER — ONDANSETRON 4 MG PO TBDP: 4.0000 mg | ORAL_TABLET | ORAL | Status: DC | PRN

## 2014-10-16 MED ORDER — SODIUM CHLORIDE 0.9 % IV BOLUS (SEPSIS)
1000.0000 mL | Freq: Once | INTRAVENOUS | Status: AC
  Administered 2014-10-16: 1000 mL via INTRAVENOUS

## 2014-10-16 MED ORDER — CEFTRIAXONE SODIUM 1 G IJ SOLR
1.0000 g | Freq: Once | INTRAMUSCULAR | Status: AC
  Administered 2014-10-16: 1 g via INTRAVENOUS
  Filled 2014-10-16: qty 10

## 2014-10-16 MED ORDER — AZITHROMYCIN 250 MG PO TABS: 250.0000 mg | ORAL_TABLET | Freq: Every day | ORAL | Status: DC

## 2014-10-16 MED ORDER — ONDANSETRON HCL 4 MG/2ML IJ SOLN
4.0000 mg | Freq: Once | INTRAMUSCULAR | Status: AC
Start: 2014-10-16 — End: 2014-10-16
  Administered 2014-10-16: 4 mg via INTRAVENOUS
  Filled 2014-10-16: qty 2

## 2014-10-16 MED ORDER — AMOXICILLIN-POT CLAVULANATE 875-125 MG PO TABS: 1.0000 | ORAL_TABLET | Freq: Two times a day (BID) | ORAL | Status: DC

## 2014-10-16 NOTE — ED Notes (Signed)
Pt attempted to urinate on bedpan, was unable. Pt states she will try again later.

## 2014-10-16 NOTE — ED Notes (Signed)
Bed: WA03 Expected date:  Expected time:  Means of arrival:  Comments: EMS 

## 2014-10-16 NOTE — ED Provider Notes (Signed)
CSN: [id number]     Arrival date & time 10/16/14  1337 History   First MD Initiated Contact with Patient 10/16/14 1347     Chief Complaint  Patient presents with  . Shortness of Breath  . Cough     (Consider location/radiation/quality/duration/timing/severity/associated sxs/prior Treatment) Patient is a 74 y.o. female presenting with shortness of breath.  Shortness of Breath Severity:  Moderate Onset quality:  Gradual Duration:  2 days Timing:  Constant Progression:  Worsening Chronicity:  New Context comment:  Started CPAP a few nights ago.  Relieved by:  Nothing Worsened by:  Coughing Associated symptoms: abdominal pain, cough, sputum production (white) and vomiting   Associated symptoms: no chest pain and no fever     Past Medical History  Diagnosis Date  . Hypertension   . Heart murmur   . Pneumonia   . Anxiety   . Depression   . Psoriasis   . Arthritis   . Hypercholesteremia   . Congenital absence of one kidney     Pt has right kidney only  . Fibromyalgia   . Carpal tunnel syndrome, bilateral   . Chronic kidney disease     CKD stage III, absence of left kidney  . Peripheral vascular disease     Dr. Benjamine Sprague; right external iliac stent '06 with angioplasty '13  . Polymyalgia rheumatica    Past Surgical History  Procedure Laterality Date  . Abdominal hysterectomy      Partial   . Appendectomy    . Tonsillectomy    . Eye surgery    . Iliac artery stent    . Anterior cervical decomp/discectomy fusion  04/19/2012    Procedure: ANTERIOR CERVICAL DECOMPRESSION/DISCECTOMY FUSION 2 LEVELS;  Surgeon: Winfield Cunas, MD;  Location: [facility];  Service: Neurosurgery;  Laterality: N/A;  Cervical four-five,Cervical five-six anterior cervical decompression with fusion plating and bonegraft possible posterior cervical decompression  . Carpal tunnel release  1990   No family history on file. History  Substance Use Topics  . Smoking status: Former Smoker -- 1.00  packs/day for 40 years    Types: Cigarettes    Quit date: 07/24/2010  . Smokeless tobacco: Never Used     Comment: STARTED BACK JULY 2013 AND RECENTLY QUIT 03-10-2012  . Alcohol Use: No     Comment: QUIT IN 1990    OB History    No data available     Review of Systems  Constitutional: Negative for fever.  Respiratory: Positive for cough, sputum production (white) and shortness of breath.   Cardiovascular: Negative for chest pain.  Gastrointestinal: Positive for vomiting and abdominal pain.  All other systems reviewed and are negative.     Allergies  Cyclobenzaprine and Statins  Home Medications   Prior to Admission medications   Medication Sig Start Date End Date Taking? Authorizing Provider  aspirin EC 81 MG tablet Take 81 mg by mouth daily.     Historical Provider, MD  Aspirin-Salicylamide-Caffeine (BC HEADACHE POWDER PO) Take 1 packet by mouth every 6 (six) hours as needed (pain).    Historical Provider, MD  chlorthalidone (HYGROTON) 25 MG tablet TAKE ONE TABLET BY MOUTH ONCE DAILY 08/04/14   Timmothy Euler, MD  Cholecalciferol (VITAMIN D) 2000 UNITS tablet Take 2,000 Units by mouth daily.    Historical Provider, MD  clopidogrel (PLAVIX) 75 MG tablet Take 1 tablet (75 mg total) by mouth every morning. 12/23/13   Timmothy Euler, MD  feeding supplement, ENSURE COMPLETE, (ENSURE  COMPLETE) LIQD Take 237 mLs by mouth 2 (two) times daily between meals. 04/17/14   Timmothy Euler, MD  fenofibrate 160 MG tablet Take 1 tablet (160 mg total) by mouth daily. 07/28/13   Timmothy Euler, MD  fluticasone (FLONASE) 50 MCG/ACT nasal spray Place 1 spray into both nostrils daily as needed for allergies or rhinitis (congestion). 08/10/14   Timmothy Euler, MD  gabapentin (NEURONTIN) 100 MG capsule Take 2 capsules (200 mg total) by mouth 3 (three) times daily. 08/10/14   Timmothy Euler, MD  loxapine (LOXITANE) 10 MG capsule Take 10 mg by mouth 2 (two) times daily.     Historical Provider,  MD  Multiple Vitamin (MULTIVITAMIN WITH MINERALS) TABS tablet Take 1 tablet by mouth daily.    Historical Provider, MD  pantoprazole (PROTONIX) 20 MG tablet Take 1 tablet (20 mg total) by mouth daily. 08/10/14   Timmothy Euler, MD  Polyethyl Glycol-Propyl Glycol (SYSTANE OP) Place 1 drop into both eyes 2 (two) times daily.    Historical Provider, MD  polyethylene glycol (MIRALAX / GLYCOLAX) packet Take 17 g by mouth daily. 03/22/14   Juluis Mire, MD  risedronate (ACTONEL) 150 MG tablet Take 150 mg by mouth every 30 (thirty) days. with water on empty stomach, nothing by mouth or lie down for next 30 minutes.    Historical Provider, MD  sertraline (ZOLOFT) 100 MG tablet Take 1 tablet (100 mg total) by mouth 2 (two) times daily. 05/08/14   Timmothy Euler, MD  sodium chloride (MURO 128) 5 % ophthalmic ointment Place 1 application into both eyes 3 (three) times daily.    Historical Provider, MD  traMADol (ULTRAM) 50 MG tablet Take 1 tablet (50 mg total) by mouth every 6 (six) hours as needed for moderate pain. 09/22/14   Timmothy Euler, MD  traZODone (DESYREL) 100 MG tablet Take 0.5 tablets (50 mg total) by mouth at bedtime. 05/08/14   Timmothy Euler, MD   BP 135/71 mmHg  Pulse 99  Temp(Src) 97.8 F (36.6 C) (Oral)  Resp 20  SpO2 100% Physical Exam  Constitutional: She is oriented to person, place, and time. She appears well-developed and well-nourished. No distress.  HENT:  Head: Normocephalic and atraumatic.  Mouth/Throat: Oropharynx is clear and moist.  Eyes: Conjunctivae are normal. Pupils are equal, round, and reactive to light. No scleral icterus.  Neck: Neck supple.  Cardiovascular: Normal rate, regular rhythm, normal heart sounds and intact distal pulses.   No murmur heard. Pulmonary/Chest: Effort normal and breath sounds normal. No stridor. No respiratory distress. She has no wheezes. She has no rales.  Abdominal: Soft. Bowel sounds are normal. She exhibits no distension.  There is generalized tenderness. There is no rigidity, no rebound and no guarding.  Musculoskeletal: Normal range of motion.  Neurological: She is alert and oriented to person, place, and time.  Skin: Skin is warm and dry. No rash noted.  Psychiatric: She has a normal mood and affect. Her behavior is normal.  Nursing note and vitals reviewed.   ED Course  Procedures (including critical care time) Labs Review Labs Reviewed  BASIC METABOLIC PANEL - Abnormal; Notable for the following:    Potassium 3.0 (*)    Creatinine, Ser 1.17 (*)    GFR calc non Af Amer 44 (*)    GFR calc Af Amer 51 (*)    All other components within normal limits  CBC  LIPASE, BLOOD  TROPONIN I  HEPATIC FUNCTION PANEL  URINALYSIS, ROUTINE W REFLEX MICROSCOPIC  Randolm Idol, ED    Imaging Review Dg Chest Port 1 View  10/16/2014   CLINICAL DATA:  Shortness of breath. Weakness for the past 2 days. Nausea.  EXAM: PORTABLE CHEST - 1 VIEW  COMPARISON:  03/23/2014  FINDINGS: Lower cervical plate and screw fixator. Dextroconvex upper thoracic scoliosis and levoconvex thoracolumbar scoliosis with spondylosis.  Mild scarring along the right hemidiaphragm. Mild chronic interstitial accentuation bilaterally. No pleural effusion identified. Central airway thickening.  IMPRESSION: 1. Airway thickening is present, suggesting bronchitis or reactive airways disease. 2. Chronic interstitial lung disease, fine reticular opacities bilaterally. 3. Chronic scarring at the right lung base. 4. Thoracolumbar scoliosis.  Thoracic spondylosis.   Electronically Signed   By: Van Clines M.D.   On: 10/16/2014 14:35     EKG Interpretation   Date/Time:  Friday October 16 2014 13:44:55 EDT Ventricular Rate:  90 PR Interval:  144 QRS Duration: 89 QT Interval:  369 QTC Calculation: 451 R Axis:   52 Text Interpretation:  Sinus rhythm ECG OTHERWISE WITHIN NORMAL LIMITS  artifact noted No significant change since last tracing Confirmed  by  Christy Gentles  MD, [name] ([id number]) on 10/16/2014 1:56:06 PM      MDM   Final diagnoses:  Shortness of breath  Abdominal pain  Bronchitis    74 yo female with CC of shortness of breath.  Has also had generalized weakness and cough productive of white sputum since yesterday.  Has had nausea and vomiting starting today.  Her exam is notable for clear lungs without wheezing or rales and a soft but diffusely tender abdomen.  Vitals stable, O2 sat 100% on RA, no increased WOB.  CXR demonstrates possible bronchitis.  Due to abdominal tenderness, feel that she needs CT (no contrast due to solitary kidney).  If CT negative, feel she's ok for dc with azithromycin for bronchitis and close PCP follow up.  Dr. Johnney Killian will follow up CT.      Serita Grit, MD 10/16/14 [phone]

## 2014-10-16 NOTE — ED Provider Notes (Signed)
I have assumed care for Dr. Doy Mince with a plan to follow up on CT results and make a definitive plan. CT does not identify any acute intra-abdominal findings. Her urinalysis however is grossly positive for UTI with WBCs too numerous to count and positive nitrite and leuk esterase. After reviewing this with the patient she felt that her symptoms are consistent with UTI. At this time based on the finding of positive urine the patient will be treated with Augmentin and not Zithromax as initially planned for bronchitis as per Dr. Doy Mince. At this time although the chest x-ray shows some findings of bronchitis, the patient denies she's had a significant amount of coughing and felt that her symptoms were more generalized in the UTI was a likely cause of her feeling generally unwell, weak and nauseated with diffuse abdominal discomfort particularly localizing in the suprapubic region.  [name], MD 10/16/14 [phone]

## 2014-10-16 NOTE — ED Notes (Addendum)
Per EMS, pt complains of shortness of breath, weakness for the past 2 days; nausea since yesterday. Pt began using a CPAP machine at night 2 weeks ago, says she does not wear it consistently. Pt had diminished lung sounds upon EMS arrival, pt given 5mg  albuterol. Pt given 4mg  zofran. Pt A&Ox4

## 2014-10-16 NOTE — Discharge Instructions (Signed)
Abdominal Pain °Many things can cause abdominal pain. Usually, abdominal pain is not caused by a disease and will improve without treatment. It can often be observed and treated at home. Your health care provider will do a physical exam and possibly order blood tests and X-rays to help determine the seriousness of your pain. However, in many cases, more time must pass before a clear cause of the pain can be found. Before that point, your health care provider may not know if you need more testing or further treatment. °HOME CARE INSTRUCTIONS  °Monitor your abdominal pain for any changes. The following actions may help to alleviate any discomfort you are experiencing: °· Only take over-the-counter or prescription medicines as directed by your health care provider. °· Do not take laxatives unless directed to do so by your health care provider. °· Try a clear liquid diet (broth, tea, or water) as directed by your health care provider. Slowly move to a bland diet as tolerated. °SEEK MEDICAL CARE IF: °· You have unexplained abdominal pain. °· You have abdominal pain associated with nausea or diarrhea. °· You have pain when you urinate or have a bowel movement. °· You experience abdominal pain that wakes you in the night. °· You have abdominal pain that is worsened or improved by eating food. °· You have abdominal pain that is worsened with eating fatty foods. °· You have a fever. °SEEK IMMEDIATE MEDICAL CARE IF:  °· Your pain does not go away within 2 hours. °· You keep throwing up (vomiting). °· Your pain is felt only in portions of the abdomen, such as the right side or the left lower portion of the abdomen. °· You pass bloody or black tarry stools. °MAKE SURE YOU: °· Understand these instructions.   °· Will watch your condition.   °· Will get help right away if you are not doing well or get worse.   °Document Released: 04/05/2005 Document Revised: 07/01/2013 Document Reviewed: 03/05/2013 °ExitCare® Patient Information  ©2015 [employer]. This information is not intended to replace advice given to you by your health care provider. Make sure you discuss any questions you have with your health care provider. °Urinary Tract Infection °Urinary tract infections (UTIs) can develop anywhere along your urinary tract. Your urinary tract is your body's drainage system for removing wastes and extra water. Your urinary tract includes two kidneys, two ureters, a bladder, and a urethra. Your kidneys are a pair of bean-shaped organs. Each kidney is about the size of your fist. They are located below your ribs, one on each side of your spine. °CAUSES °Infections are caused by microbes, which are microscopic organisms, including fungi, viruses, and bacteria. These organisms are so small that they can only be seen through a microscope. Bacteria are the microbes that most commonly cause UTIs. °SYMPTOMS  °Symptoms of UTIs may vary by age and gender of the patient and by the location of the infection. Symptoms in young women typically include a frequent and intense urge to urinate and a painful, burning feeling in the bladder or urethra during urination. Older women and men are more likely to be tired, shaky, and weak and have muscle aches and abdominal pain. A fever may mean the infection is in your kidneys. Other symptoms of a kidney infection include pain in your back or sides below the ribs, nausea, and vomiting. °DIAGNOSIS °To diagnose a UTI, your caregiver will ask you about your symptoms. Your caregiver also will ask to provide a urine sample. The urine sample   will be tested for bacteria and white blood cells. White blood cells are made by your body to help fight infection. °TREATMENT  °Typically, UTIs can be treated with medication. Because most UTIs are caused by a bacterial infection, they usually can be treated with the use of antibiotics. The choice of antibiotic and length of treatment depend on your symptoms and the type of bacteria  causing your infection. °HOME CARE INSTRUCTIONS °· If you were prescribed antibiotics, take them exactly as your caregiver instructs you. Finish the medication even if you feel better after you have only taken some of the medication. °· Drink enough water and fluids to keep your urine clear or pale yellow. °· Avoid caffeine, tea, and carbonated beverages. They tend to irritate your bladder. °· Empty your bladder often. Avoid holding urine for long periods of time. °· Empty your bladder before and after sexual intercourse. °· After a bowel movement, women should cleanse from front to back. Use each tissue only once. °SEEK MEDICAL CARE IF:  °· You have back pain. °· You develop a fever. °· Your symptoms do not begin to resolve within 3 days. °SEEK IMMEDIATE MEDICAL CARE IF:  °· You have severe back pain or lower abdominal pain. °· You develop chills. °· You have nausea or vomiting. °· You have continued burning or discomfort with urination. °MAKE SURE YOU:  °· Understand these instructions. °· Will watch your condition. °· Will get help right away if you are not doing well or get worse. °Document Released: 04/05/2005 Document Revised: 12/26/2011 Document Reviewed: 08/04/2011 °ExitCare® Patient Information ©2015 [employer]. This information is not intended to replace advice given to you by your health care provider. Make sure you discuss any questions you have with your health care provider. ° °

## 2014-10-19 ENCOUNTER — Inpatient Hospital Stay (HOSPITAL_COMMUNITY)
Admission: EM | Admit: 2014-10-19 | Discharge: 2014-10-27 | DRG: 372 | Disposition: A | Discharge status: Skilled Nursing Facility | Payer: PPO | Attending: Family Medicine | Admitting: Family Medicine

## 2014-10-19 ENCOUNTER — Emergency Department (HOSPITAL_COMMUNITY): Payer: PPO

## 2014-10-19 ENCOUNTER — Encounter (HOSPITAL_COMMUNITY): Payer: Self-pay | Admitting: *Deleted

## 2014-10-19 DIAGNOSIS — Z85828 Personal history of other malignant neoplasm of skin: Secondary | ICD-10-CM | POA: Diagnosis not present

## 2014-10-19 DIAGNOSIS — K219 Gastro-esophageal reflux disease without esophagitis: Secondary | ICD-10-CM | POA: Diagnosis present

## 2014-10-19 DIAGNOSIS — R0600 Dyspnea, unspecified: Secondary | ICD-10-CM | POA: Diagnosis present

## 2014-10-19 DIAGNOSIS — I739 Peripheral vascular disease, unspecified: Secondary | ICD-10-CM | POA: Diagnosis present

## 2014-10-19 DIAGNOSIS — E785 Hyperlipidemia, unspecified: Secondary | ICD-10-CM | POA: Diagnosis present

## 2014-10-19 DIAGNOSIS — Z95828 Presence of other vascular implants and grafts: Secondary | ICD-10-CM | POA: Diagnosis not present

## 2014-10-19 DIAGNOSIS — R109 Unspecified abdominal pain: Secondary | ICD-10-CM | POA: Diagnosis present

## 2014-10-19 DIAGNOSIS — Z7951 Long term (current) use of inhaled steroids: Secondary | ICD-10-CM | POA: Diagnosis not present

## 2014-10-19 DIAGNOSIS — E875 Hyperkalemia: Secondary | ICD-10-CM | POA: Diagnosis not present

## 2014-10-19 DIAGNOSIS — Z8639 Personal history of other endocrine, nutritional and metabolic disease: Secondary | ICD-10-CM | POA: Diagnosis not present

## 2014-10-19 DIAGNOSIS — R197 Diarrhea, unspecified: Secondary | ICD-10-CM | POA: Insufficient documentation

## 2014-10-19 DIAGNOSIS — G4733 Obstructive sleep apnea (adult) (pediatric): Secondary | ICD-10-CM | POA: Diagnosis present

## 2014-10-19 DIAGNOSIS — N390 Urinary tract infection, site not specified: Secondary | ICD-10-CM | POA: Diagnosis present

## 2014-10-19 DIAGNOSIS — R112 Nausea with vomiting, unspecified: Secondary | ICD-10-CM | POA: Diagnosis present

## 2014-10-19 DIAGNOSIS — E876 Hypokalemia: Secondary | ICD-10-CM | POA: Diagnosis present

## 2014-10-19 DIAGNOSIS — Z8701 Personal history of pneumonia (recurrent): Secondary | ICD-10-CM | POA: Diagnosis not present

## 2014-10-19 DIAGNOSIS — F418 Other specified anxiety disorders: Secondary | ICD-10-CM | POA: Diagnosis present

## 2014-10-19 DIAGNOSIS — J209 Acute bronchitis, unspecified: Secondary | ICD-10-CM | POA: Diagnosis present

## 2014-10-19 DIAGNOSIS — E860 Dehydration: Secondary | ICD-10-CM | POA: Diagnosis present

## 2014-10-19 DIAGNOSIS — D696 Thrombocytopenia, unspecified: Secondary | ICD-10-CM | POA: Diagnosis present

## 2014-10-19 DIAGNOSIS — Z87891 Personal history of nicotine dependence: Secondary | ICD-10-CM | POA: Diagnosis not present

## 2014-10-19 DIAGNOSIS — A0472 Enterocolitis due to Clostridium difficile, not specified as recurrent: Secondary | ICD-10-CM | POA: Insufficient documentation

## 2014-10-19 DIAGNOSIS — Z905 Acquired absence of kidney: Secondary | ICD-10-CM | POA: Diagnosis present

## 2014-10-19 DIAGNOSIS — Z7902 Long term (current) use of antithrombotics/antiplatelets: Secondary | ICD-10-CM | POA: Diagnosis not present

## 2014-10-19 DIAGNOSIS — Z888 Allergy status to other drugs, medicaments and biological substances status: Secondary | ICD-10-CM | POA: Diagnosis not present

## 2014-10-19 DIAGNOSIS — R1110 Vomiting, unspecified: Secondary | ICD-10-CM | POA: Diagnosis not present

## 2014-10-19 DIAGNOSIS — H9190 Unspecified hearing loss, unspecified ear: Secondary | ICD-10-CM | POA: Diagnosis present

## 2014-10-19 DIAGNOSIS — Z8669 Personal history of other diseases of the nervous system and sense organs: Secondary | ICD-10-CM | POA: Diagnosis not present

## 2014-10-19 DIAGNOSIS — R1030 Lower abdominal pain, unspecified: Secondary | ICD-10-CM | POA: Insufficient documentation

## 2014-10-19 DIAGNOSIS — R011 Cardiac murmur, unspecified: Secondary | ICD-10-CM | POA: Diagnosis present

## 2014-10-19 DIAGNOSIS — I129 Hypertensive chronic kidney disease with stage 1 through stage 4 chronic kidney disease, or unspecified chronic kidney disease: Secondary | ICD-10-CM | POA: Diagnosis present

## 2014-10-19 DIAGNOSIS — Z90710 Acquired absence of both cervix and uterus: Secondary | ICD-10-CM | POA: Diagnosis not present

## 2014-10-19 DIAGNOSIS — G629 Polyneuropathy, unspecified: Secondary | ICD-10-CM | POA: Diagnosis present

## 2014-10-19 DIAGNOSIS — Z872 Personal history of diseases of the skin and subcutaneous tissue: Secondary | ICD-10-CM | POA: Diagnosis not present

## 2014-10-19 DIAGNOSIS — M810 Age-related osteoporosis without current pathological fracture: Secondary | ICD-10-CM | POA: Diagnosis present

## 2014-10-19 DIAGNOSIS — M353 Polymyalgia rheumatica: Secondary | ICD-10-CM | POA: Diagnosis present

## 2014-10-19 DIAGNOSIS — E782 Mixed hyperlipidemia: Secondary | ICD-10-CM | POA: Diagnosis present

## 2014-10-19 DIAGNOSIS — M797 Fibromyalgia: Secondary | ICD-10-CM | POA: Diagnosis present

## 2014-10-19 DIAGNOSIS — F419 Anxiety disorder, unspecified: Secondary | ICD-10-CM | POA: Diagnosis present

## 2014-10-19 DIAGNOSIS — Z79899 Other long term (current) drug therapy: Secondary | ICD-10-CM | POA: Diagnosis not present

## 2014-10-19 DIAGNOSIS — A047 Enterocolitis due to Clostridium difficile: Secondary | ICD-10-CM | POA: Diagnosis not present

## 2014-10-19 DIAGNOSIS — N183 Chronic kidney disease, stage 3 (moderate): Secondary | ICD-10-CM | POA: Diagnosis present

## 2014-10-19 DIAGNOSIS — I10 Essential (primary) hypertension: Secondary | ICD-10-CM | POA: Diagnosis present

## 2014-10-19 DIAGNOSIS — R1084 Generalized abdominal pain: Secondary | ICD-10-CM | POA: Diagnosis not present

## 2014-10-19 DIAGNOSIS — R0609 Other forms of dyspnea: Secondary | ICD-10-CM | POA: Diagnosis present

## 2014-10-19 DIAGNOSIS — Z7982 Long term (current) use of aspirin: Secondary | ICD-10-CM | POA: Diagnosis not present

## 2014-10-19 DIAGNOSIS — R52 Pain, unspecified: Secondary | ICD-10-CM | POA: Insufficient documentation

## 2014-10-19 LAB — CBC WITH DIFFERENTIAL/PLATELET
Basophils Absolute: 0 10*3/uL (ref 0.0–0.1)
Basophils Relative: 0 % (ref 0–1)
Eosinophils Absolute: 0 10*3/uL (ref 0.0–0.7)
Eosinophils Relative: 1 % (ref 0–5)
HCT: 38.5 % (ref 36.0–46.0)
Hemoglobin: 13.5 g/dL (ref 12.0–15.0)
Lymphocytes Relative: 23 % (ref 12–46)
Lymphs Abs: 1.3 10*3/uL (ref 0.7–4.0)
MCH: 33.8 pg (ref 26.0–34.0)
MCHC: 35.1 g/dL (ref 30.0–36.0)
MCV: 96.5 fL (ref 78.0–100.0)
Monocytes Absolute: 0.6 10*3/uL (ref 0.1–1.0)
Monocytes Relative: 10 % (ref 3–12)
Neutro Abs: 3.9 10*3/uL (ref 1.7–7.7)
Neutrophils Relative %: 66 % (ref 43–77)
Platelets: 184 10*3/uL (ref 150–400)
RBC: 3.99 MIL/uL (ref 3.87–5.11)
RDW: 13.6 % (ref 11.5–15.5)
WBC: 5.8 10*3/uL (ref 4.0–10.5)

## 2014-10-19 LAB — COMPREHENSIVE METABOLIC PANEL
ALT: 12 U/L (ref 0–35)
AST: 31 U/L (ref 0–37)
Albumin: 4.2 g/dL (ref 3.5–5.2)
Alkaline Phosphatase: 69 U/L (ref 39–117)
Anion gap: 11 (ref 5–15)
BUN: 8 mg/dL (ref 6–23)
CO2: 25 mmol/L (ref 19–32)
Calcium: 9 mg/dL (ref 8.4–10.5)
Chloride: 100 mmol/L (ref 96–112)
Creatinine, Ser: 1 mg/dL (ref 0.50–1.10)
GFR calc Af Amer: 62 mL/min — ABNORMAL LOW (ref >90)
GFR calc non Af Amer: 54 mL/min — ABNORMAL LOW (ref >90)
Glucose, Bld: 98 mg/dL (ref 70–99)
Potassium: 3.3 mmol/L — ABNORMAL LOW (ref 3.5–5.1)
Sodium: 136 mmol/L (ref 135–145)
Total Bilirubin: 0.7 mg/dL (ref 0.3–1.2)
Total Protein: 7.2 g/dL (ref 6.0–8.3)

## 2014-10-19 LAB — URINALYSIS, ROUTINE W REFLEX MICROSCOPIC
Bilirubin Urine: NEGATIVE
Glucose, UA: NEGATIVE mg/dL
Hgb urine dipstick: NEGATIVE
Ketones, ur: NEGATIVE mg/dL
Leukocytes, UA: NEGATIVE
Nitrite: NEGATIVE
Protein, ur: NEGATIVE mg/dL
Specific Gravity, Urine: 1.009 (ref 1.005–1.030)
Urobilinogen, UA: 0.2 mg/dL (ref 0.0–1.0)
pH: 7.5 (ref 5.0–8.0)

## 2014-10-19 LAB — I-STAT CG4 LACTIC ACID, ED: Lactic Acid, Venous: 0.87 mmol/L (ref 0.5–2.0)

## 2014-10-19 LAB — CREATININE, SERUM
Creatinine, Ser: 0.98 mg/dL (ref 0.50–1.10)
GFR calc Af Amer: 64 mL/min — ABNORMAL LOW (ref >90)
GFR calc non Af Amer: 55 mL/min — ABNORMAL LOW (ref >90)

## 2014-10-19 LAB — CBC
HCT: 33.4 % — ABNORMAL LOW (ref 36.0–46.0)
Hemoglobin: 11.6 g/dL — ABNORMAL LOW (ref 12.0–15.0)
MCH: 33.6 pg (ref 26.0–34.0)
MCHC: 34.7 g/dL (ref 30.0–36.0)
MCV: 96.8 fL (ref 78.0–100.0)
Platelets: 168 10*3/uL (ref 150–400)
RBC: 3.45 MIL/uL — ABNORMAL LOW (ref 3.87–5.11)
RDW: 13.9 % (ref 11.5–15.5)
WBC: 6.3 10*3/uL (ref 4.0–10.5)

## 2014-10-19 LAB — URINE CULTURE: Colony Count: >=100000

## 2014-10-19 MED ORDER — FENTANYL CITRATE 0.05 MG/ML IJ SOLN
100.0000 ug | Freq: Once | INTRAMUSCULAR | Status: AC
  Administered 2014-10-19: 100 ug via INTRAVENOUS
  Filled 2014-10-19: qty 2

## 2014-10-19 MED ORDER — AMOXICILLIN-POT CLAVULANATE 875-125 MG PO TABS
1.0000 | ORAL_TABLET | Freq: Two times a day (BID) | ORAL | Status: DC
  Administered 2014-10-19: 1 via ORAL
  Filled 2014-10-19: qty 1

## 2014-10-19 MED ORDER — PANTOPRAZOLE SODIUM 20 MG PO TBEC
20.0000 mg | DELAYED_RELEASE_TABLET | Freq: Every day | ORAL | Status: DC
  Administered 2014-10-20 – 2014-10-27 (×8): 20 mg via ORAL
  Filled 2014-10-19 (×8): qty 1

## 2014-10-19 MED ORDER — GABAPENTIN 100 MG PO CAPS
200.0000 mg | ORAL_CAPSULE | Freq: Three times a day (TID) | ORAL | Status: DC
  Administered 2014-10-19 – 2014-10-27 (×23): 200 mg via ORAL
  Filled 2014-10-19 (×25): qty 2

## 2014-10-19 MED ORDER — POTASSIUM CHLORIDE 10 MEQ/100ML IV SOLN
10.0000 meq | Freq: Once | INTRAVENOUS | Status: AC
  Administered 2014-10-19: 10 meq via INTRAVENOUS
  Filled 2014-10-19: qty 100

## 2014-10-19 MED ORDER — FENOFIBRATE 160 MG PO TABS
160.0000 mg | ORAL_TABLET | Freq: Every day | ORAL | Status: DC
Start: 2014-10-20 — End: 2014-10-24
  Administered 2014-10-20 – 2014-10-23 (×4): 160 mg via ORAL
  Filled 2014-10-19 (×5): qty 1

## 2014-10-19 MED ORDER — KCL IN DEXTROSE-NACL 30-5-0.45 MEQ/L-%-% IV SOLN
INTRAVENOUS | Status: DC
  Administered 2014-10-20 – 2014-10-21 (×4): via INTRAVENOUS
  Filled 2014-10-19 (×6): qty 1000

## 2014-10-19 MED ORDER — ENSURE COMPLETE PO LIQD
237.0000 mL | Freq: Two times a day (BID) | ORAL | Status: DC
Start: 2014-10-20 — End: 2014-10-19

## 2014-10-19 MED ORDER — ENSURE ENLIVE PO LIQD
237.0000 mL | Freq: Two times a day (BID) | ORAL | Status: DC
  Administered 2014-10-20 – 2014-10-26 (×6): 237 mL via ORAL

## 2014-10-19 MED ORDER — HEPARIN SODIUM (PORCINE) 5000 UNIT/ML IJ SOLN
5000.0000 [IU] | Freq: Three times a day (TID) | INTRAMUSCULAR | Status: DC
  Administered 2014-10-19 – 2014-10-27 (×22): 5000 [IU] via SUBCUTANEOUS
  Filled 2014-10-19 (×21): qty 1

## 2014-10-19 MED ORDER — DEXTROSE 5 % IV SOLN
1.0000 g | Freq: Once | INTRAVENOUS | Status: AC
  Administered 2014-10-19: 1 g via INTRAVENOUS
  Filled 2014-10-19: qty 10

## 2014-10-19 MED ORDER — TRAZODONE HCL 50 MG PO TABS
50.0000 mg | ORAL_TABLET | Freq: Every day | ORAL | Status: DC
  Administered 2014-10-19 – 2014-10-26 (×8): 50 mg via ORAL
  Filled 2014-10-19 (×8): qty 1

## 2014-10-19 MED ORDER — SODIUM CHLORIDE 0.9 % IV SOLN
INTRAVENOUS | Status: AC
  Administered 2014-10-19: 20:00:00 via INTRAVENOUS

## 2014-10-19 MED ORDER — SODIUM CHLORIDE (HYPERTONIC) 5 % OP OINT
1.0000 "application " | TOPICAL_OINTMENT | Freq: Three times a day (TID) | OPHTHALMIC | Status: DC
  Administered 2014-10-19 – 2014-10-27 (×22): 1 via OPHTHALMIC
  Filled 2014-10-19 (×2): qty 3.5

## 2014-10-19 MED ORDER — SERTRALINE HCL 100 MG PO TABS
100.0000 mg | ORAL_TABLET | Freq: Two times a day (BID) | ORAL | Status: DC
  Administered 2014-10-19 – 2014-10-27 (×16): 100 mg via ORAL
  Filled 2014-10-19 (×16): qty 1

## 2014-10-19 MED ORDER — ONDANSETRON HCL 4 MG PO TABS: 4.0000 mg | ORAL_TABLET | Freq: Four times a day (QID) | ORAL | Status: DC | PRN

## 2014-10-19 MED ORDER — ONDANSETRON HCL 4 MG/2ML IJ SOLN
4.0000 mg | Freq: Once | INTRAMUSCULAR | Status: AC
  Administered 2014-10-19: 4 mg via INTRAVENOUS
  Filled 2014-10-19: qty 2

## 2014-10-19 MED ORDER — ONDANSETRON HCL 4 MG/2ML IJ SOLN
4.0000 mg | Freq: Four times a day (QID) | INTRAMUSCULAR | Status: DC | PRN
  Administered 2014-10-21 – 2014-10-24 (×2): 4 mg via INTRAVENOUS
  Filled 2014-10-19 (×3): qty 2

## 2014-10-19 MED ORDER — CHLORTHALIDONE 25 MG PO TABS
25.0000 mg | ORAL_TABLET | Freq: Every day | ORAL | Status: DC
  Filled 2014-10-19: qty 1

## 2014-10-19 MED ORDER — SODIUM CHLORIDE 0.9 % IV BOLUS (SEPSIS)
2000.0000 mL | Freq: Once | INTRAVENOUS | Status: AC
  Administered 2014-10-19: 2000 mL via INTRAVENOUS

## 2014-10-19 MED ORDER — TRAMADOL HCL 50 MG PO TABS
50.0000 mg | ORAL_TABLET | Freq: Four times a day (QID) | ORAL | Status: DC | PRN
  Administered 2014-10-19 – 2014-10-27 (×16): 50 mg via ORAL
  Filled 2014-10-19 (×17): qty 1

## 2014-10-19 MED ORDER — ALUM & MAG HYDROXIDE-SIMETH 200-200-20 MG/5ML PO SUSP
30.0000 mL | Freq: Four times a day (QID) | ORAL | Status: DC | PRN
  Administered 2014-10-22 (×2): 30 mL via ORAL
  Filled 2014-10-19 (×2): qty 30

## 2014-10-19 MED ORDER — CLOPIDOGREL BISULFATE 75 MG PO TABS
75.0000 mg | ORAL_TABLET | Freq: Every morning | ORAL | Status: DC
  Administered 2014-10-20 – 2014-10-27 (×8): 75 mg via ORAL
  Filled 2014-10-19 (×9): qty 1

## 2014-10-19 NOTE — ED Notes (Signed)
In and out was done by me and Lanelle Bal

## 2014-10-19 NOTE — H&P (Signed)
[facility] Admission History and Physical Service Pager: [phone]  Patient name: [name] [name] record number: [id number] Date of birth: 04/20/1941 Age: 74 y.o. Gender: female  Primary Care Provider: Kenn File, MD Consultants: None Code Status: Full  Chief Complaint: Low abdominal pain, dehydration  Assessment and Plan: [name] [name] is a 74 y.o. female presenting with low abdominal pain and weakness. PMH is significant for OSA on CPAP, HTN, HLD, PVD.   E. coli UTI: Pan-sensitive on 4/8 culture. Seems to be well treated with augmentin (repeat UA today is negative) though this may be causing antibiotic-associated diarrhea. No SIRS, lactate 0.87.  - Give CTX while inpatient, transition to oral keflex as able, given favorable sensitivities - Follow urine culture  Diarrhea/dehydration: Coincident with abx beginning. With expected electrolyte abnormalities.  - CDiff by PCR, enteric precautions - Replete volume and electrolytes as indicated; check Mag and Phos - Hold diuretic  Weakness: Acute component due to UTI and dehydration due to poor po in addition to general deconditioning. She will require physical therapy.  - PT/OT to evaluate and treat; SW not yet consulted pending recommendations. Pt amenable to short term rehabilitation if required.   HTN/PVD/HLD: Continue plavix. Hold chlorthalidone given dehydration and hypokalemia. Continue fenofibrate (statin intolerant).  Neuropathy: Continue neurontin and tramadol Depression/anxiety: Continue zoloft and trazodone  FEN/GI: Heart healthy diet, ADAT; NS @ 150cc/hr (1.5x MIVF) Prophylaxis: Subcutaneous heparin  Disposition: Admit to inpatient as she may require rehabilitation following rehydration.   History of Present Illness: [name] [name] is a 74 y.o. female presenting with lower abdominal pain.   She reports this pain is moderate-to-severe, bilateral, nonradiating, worsening for the past few  days. It was associated with dysuria causing her to present to the ED 4/8 where she was given augmentin for UTI and also told she may have bronchitis. She has taken this though she has had some diarrhea, nausea without vomiting, and severe global weakness. She has 2-3 loose stools per day since 4/9. She denies fevers, chills, vision changes, speech difficulties, CP, SOB, blood in stool, LE swelling, orthopnea.    Review Of Systems: Per HPI with the following additions: Intermittent HA.  Otherwise 12 point review of systems was performed and was unremarkable.  Patient Active Problem List   Diagnosis Date Noted  . Abdominal pain 10/19/2014  . Palpitations 10/14/2014  . OSA (obstructive sleep apnea) 09/21/2014  . Osteoporosis 06/29/2014  . Thyroid nodule 06/29/2014  . Breast calcifications 06/29/2014  . UTI (lower urinary tract infection) 03/21/2014  . Glaucoma 01/08/2014  . Trigeminal neuralgia 12/18/2013  . Basal cell carcinoma 11/12/2013  . Presbyopia 10/22/2013  . Resting tremor 10/14/2013  . Hearing loss 10/13/2013  . Dizziness 10/05/2013  . Dysmetria 10/04/2013  . Polymyalgia rheumatica 09/11/2013  . GERD (gastroesophageal reflux disease) 09/11/2013  . Rash and nonspecific skin eruption 07/28/2013  . Pulmonary nodules 05/24/2012  . DOE (dyspnea on exertion) 05/24/2012  . PVD (peripheral vascular disease) 04/30/2012  . Carotid bruit 04/30/2012  . Hypertension 04/27/2012  . Chest pain 04/27/2012  . Anxiety and depression 04/27/2012  . Hyperlipemia 04/27/2012  . Cervical spondylosis with radiculopathy 04/19/2012   Past Medical History: Past Medical History  Diagnosis Date  . Hypertension   . Heart murmur   . Pneumonia   . Anxiety   . Depression   . Psoriasis   . Arthritis   . Hypercholesteremia   . Congenital absence of one kidney     Pt has  right kidney only  . Fibromyalgia   . Carpal tunnel syndrome, bilateral   . Chronic kidney disease     CKD stage III, absence  of left kidney  . Peripheral vascular disease     Dr. Benjamine Sprague; right external iliac stent '06 with angioplasty '13  . Polymyalgia rheumatica    Past Surgical History: Past Surgical History  Procedure Laterality Date  . Abdominal hysterectomy      Partial   . Appendectomy    . Tonsillectomy    . Eye surgery    . Iliac artery stent    . Anterior cervical decomp/discectomy fusion  04/19/2012    Procedure: ANTERIOR CERVICAL DECOMPRESSION/DISCECTOMY FUSION 2 LEVELS;  Surgeon: Winfield Cunas, MD;  Location: [place];  Service: Neurosurgery;  Laterality: N/A;  Cervical four-five,Cervical five-six anterior cervical decompression with fusion plating and bonegraft possible posterior cervical decompression  . Carpal tunnel release  1990   Social History: History  Substance Use Topics  . Smoking status: Former Smoker -- 1.00 packs/day for 40 years    Types: Cigarettes    Quit date: 07/24/2010  . Smokeless tobacco: Never Used     Comment: STARTED BACK JULY 2013 AND RECENTLY QUIT 03-10-2012  . Alcohol Use: No     Comment: QUIT IN 1990    Please also refer to relevant sections of EMR.  Family History: History reviewed. No pertinent family history. Allergies and Medications: Allergies  Allergen Reactions  . Cyclobenzaprine Itching    Possible reaction per pt  . Statins Swelling, Rash and Other (See Comments)    Swelling involving tongue; also causes muscle pain   No current facility-administered medications on file prior to encounter.   Current Outpatient Prescriptions on File Prior to Encounter  Medication Sig Dispense Refill  . amoxicillin-clavulanate (AUGMENTIN) 875-125 MG per tablet Take 1 tablet by mouth 2 (two) times daily. One po bid x 7 days 14 tablet 0  . Aspirin-Salicylamide-Caffeine (BC HEADACHE POWDER PO) Take 1 packet by mouth every 6 (six) hours as needed (pain).    . chlorthalidone (HYGROTON) 25 MG tablet TAKE ONE TABLET BY MOUTH ONCE DAILY 30 tablet 11  .  Cholecalciferol (VITAMIN D) 2000 UNITS tablet Take 2,000 Units by mouth daily.    . clopidogrel (PLAVIX) 75 MG tablet Take 1 tablet (75 mg total) by mouth every morning. 90 tablet 3  . feeding supplement, ENSURE COMPLETE, (ENSURE COMPLETE) LIQD Take 237 mLs by mouth 2 (two) times daily between meals. 60 Bottle 11  . fenofibrate 160 MG tablet Take 1 tablet (160 mg total) by mouth daily. 90 tablet 3  . gabapentin (NEURONTIN) 100 MG capsule Take 2 capsules (200 mg total) by mouth 3 (three) times daily. 180 capsule 5  . Multiple Vitamin (MULTIVITAMIN WITH MINERALS) TABS tablet Take 1 tablet by mouth daily.    . pantoprazole (PROTONIX) 20 MG tablet Take 1 tablet (20 mg total) by mouth daily. 90 tablet 3  . Polyethyl Glycol-Propyl Glycol (SYSTANE OP) Place 1 drop into both eyes 2 (two) times daily.    . polyethylene glycol (MIRALAX / GLYCOLAX) packet Take 17 g by mouth daily. (Patient taking differently: Take 17 g by mouth daily as needed for mild constipation or moderate constipation. ) 14 each 0  . sertraline (ZOLOFT) 100 MG tablet Take 1 tablet (100 mg total) by mouth 2 (two) times daily. 60 tablet 11  . sodium chloride (MURO 128) 5 % ophthalmic ointment Place 1 application into both eyes 3 (three)  times daily.    . traMADol (ULTRAM) 50 MG tablet Take 1 tablet (50 mg total) by mouth every 6 (six) hours as needed for moderate pain. 270 tablet 0  . traZODone (DESYREL) 100 MG tablet Take 0.5 tablets (50 mg total) by mouth at bedtime. 30 tablet 11  . azithromycin (ZITHROMAX) 250 MG tablet Take 1 tablet (250 mg total) by mouth daily. Take first 2 tablets together, then 1 every day until finished. (Patient not taking: Reported on 10/19/2014) 6 tablet 0  . fluticasone (FLONASE) 50 MCG/ACT nasal spray Place 1 spray into both nostrils daily as needed for allergies or rhinitis (congestion). (Patient not taking: Reported on 10/19/2014) 16 g 11  . ondansetron (ZOFRAN ODT) 4 MG disintegrating tablet Take 1 tablet (4 mg  total) by mouth every 4 (four) hours as needed for nausea or vomiting. (Patient not taking: Reported on 10/19/2014) 20 tablet 0    Objective: BP 127/105 mmHg  Pulse 60  Temp(Src) 98.2 F (36.8 C) (Oral)  Resp 17  SpO2 95% Exam: General: Meek, pleasant, elderly woman in no distress.  HEENT: Tacky mucous membranes Cardiovascular: Regular rate, no JVD or LE edema Respiratory: Normal effort, CTAB Abdomen: Soft, tender to palpation bilateral lower quadrants without rebound or guarding.  Extremities: Warm, cap refill < 3 sec Skin: No wounds or rashes noted Neuro: Drowsy but rousable, oriented and conversant with normal speech. CN's intact.   Labs and Imaging: CBC BMET   Recent Labs Lab 10/19/14 1457  WBC 5.8  HGB 13.5  HCT 38.5  PLT 184    Recent Labs Lab 10/19/14 1457  NA 136  K 3.3*  CL 100  CO2 25  BUN 8  CREATININE 1.00  GLUCOSE 98  CALCIUM 9.0       [name], MD 10/19/2014, 7:12 PM PGY-2, [facility] pager: [phone], text pages welcome

## 2014-10-19 NOTE — ED Provider Notes (Addendum)
CSN: [id number]     Arrival date & time 10/19/14  1432 History   First MD Initiated Contact with Patient 10/19/14 1509     Chief Complaint  Patient presents with  . Weakness  . Abdominal Pain     (Consider location/radiation/quality/duration/timing/severity/associated sxs/prior Treatment) HPI Complains of low abdominal pain onset 10/15/2014 accompanied by dysuria. Patient vomited one time yesterday. She has not eaten nor drunk in 2 days. She was seen here on 10/16/2014 diagnosed with urinary tract infection. Treated with Augmentin, with which she's been compliant. Today she presented she complains of generalized weakness. Feels dehydrated. Associated symptoms include nausea. Nothing makes symptoms better or worse. Past Medical History  Diagnosis Date  . Hypertension   . Heart murmur   . Pneumonia   . Anxiety   . Depression   . Psoriasis   . Arthritis   . Hypercholesteremia   . Congenital absence of one kidney     Pt has right kidney only  . Fibromyalgia   . Carpal tunnel syndrome, bilateral   . Chronic kidney disease     CKD stage III, absence of left kidney  . Peripheral vascular disease     Dr. Benjamine Sprague; right external iliac stent '06 with angioplasty '13  . Polymyalgia rheumatica    Past Surgical History  Procedure Laterality Date  . Abdominal hysterectomy      Partial   . Appendectomy    . Tonsillectomy    . Eye surgery    . Iliac artery stent    . Anterior cervical decomp/discectomy fusion  04/19/2012    Procedure: ANTERIOR CERVICAL DECOMPRESSION/DISCECTOMY FUSION 2 LEVELS;  Surgeon: Winfield Cunas, MD;  Location: [facility];  Service: Neurosurgery;  Laterality: N/A;  Cervical four-five,Cervical five-six anterior cervical decompression with fusion plating and bonegraft possible posterior cervical decompression  . Carpal tunnel release  1990   History reviewed. No pertinent family history. History  Substance Use Topics  . Smoking status: Former Smoker -- 1.00  packs/day for 40 years    Types: Cigarettes    Quit date: 07/24/2010  . Smokeless tobacco: Never Used     Comment: STARTED BACK JULY 2013 AND RECENTLY QUIT 03-10-2012  . Alcohol Use: No     Comment: QUIT IN 1990    OB History    No data available     Review of Systems  Constitutional: Positive for fever.  Gastrointestinal: Positive for nausea, vomiting and abdominal pain.  Neurological: Positive for weakness.  All other systems reviewed and are negative.     Allergies  Cyclobenzaprine and Statins  Home Medications   Prior to Admission medications   Medication Sig Start Date End Date Taking? Authorizing Provider  amoxicillin-clavulanate (AUGMENTIN) 875-125 MG per tablet Take 1 tablet by mouth 2 (two) times daily. One po bid x 7 days 10/16/14  Yes [name], MD  Aspirin-Salicylamide-Caffeine [facility] HEADACHE POWDER PO) Take 1 packet by mouth every 6 (six) hours as needed (pain).   Yes Historical Provider, MD  chlorthalidone (HYGROTON) 25 MG tablet TAKE ONE TABLET BY MOUTH ONCE DAILY 08/04/14  Yes Timmothy Euler, MD  Cholecalciferol (VITAMIN D) 2000 UNITS tablet Take 2,000 Units by mouth daily.   Yes Historical Provider, MD  clopidogrel (PLAVIX) 75 MG tablet Take 1 tablet (75 mg total) by mouth every morning. 12/23/13  Yes Timmothy Euler, MD  feeding supplement, ENSURE COMPLETE, (ENSURE COMPLETE) LIQD Take 237 mLs by mouth 2 (two) times daily between meals. 04/17/14  Yes Mikeal Hawthorne  Carmin Muskrat, MD  fenofibrate 160 MG tablet Take 1 tablet (160 mg total) by mouth daily. 07/28/13  Yes Timmothy Euler, MD  gabapentin (NEURONTIN) 100 MG capsule Take 2 capsules (200 mg total) by mouth 3 (three) times daily. 08/10/14  Yes Timmothy Euler, MD  Multiple Vitamin (MULTIVITAMIN WITH MINERALS) TABS tablet Take 1 tablet by mouth daily.   Yes Historical Provider, MD  pantoprazole (PROTONIX) 20 MG tablet Take 1 tablet (20 mg total) by mouth daily. 08/10/14  Yes Timmothy Euler, MD  Polyethyl  Glycol-Propyl Glycol (SYSTANE OP) Place 1 drop into both eyes 2 (two) times daily.   Yes Historical Provider, MD  polyethylene glycol (MIRALAX / GLYCOLAX) packet Take 17 g by mouth daily. Patient taking differently: Take 17 g by mouth daily as needed for mild constipation or moderate constipation.  03/22/14  Yes Juluis Mire, MD  sertraline (ZOLOFT) 100 MG tablet Take 1 tablet (100 mg total) by mouth 2 (two) times daily. 05/08/14  Yes Timmothy Euler, MD  sodium chloride (MURO 128) 5 % ophthalmic ointment Place 1 application into both eyes 3 (three) times daily.   Yes Historical Provider, MD  traMADol (ULTRAM) 50 MG tablet Take 1 tablet (50 mg total) by mouth every 6 (six) hours as needed for moderate pain. 09/22/14  Yes Timmothy Euler, MD  traZODone (DESYREL) 100 MG tablet Take 0.5 tablets (50 mg total) by mouth at bedtime. 05/08/14  Yes Timmothy Euler, MD  azithromycin (ZITHROMAX) 250 MG tablet Take 1 tablet (250 mg total) by mouth daily. Take first 2 tablets together, then 1 every day until finished. Patient not taking: Reported on 10/19/2014 10/16/14   Serita Grit, MD  fluticasone [facility]) 50 MCG/ACT nasal spray Place 1 spray into both nostrils daily as needed for allergies or rhinitis (congestion). Patient not taking: Reported on 10/19/2014 08/10/14   Timmothy Euler, MD  ondansetron (ZOFRAN ODT) 4 MG disintegrating tablet Take 1 tablet (4 mg total) by mouth every 4 (four) hours as needed for nausea or vomiting. Patient not taking: Reported on 10/19/2014 10/16/14   [name], MD   BP 152/83 mmHg  Pulse 91  Temp(Src) 98 F (36.7 C) (Oral)  Resp 16  SpO2 95% Physical Exam  Constitutional: She is oriented to person, place, and time. She appears distressed.  Ill-appearing, alert Glasgow Coma Score 15  HENT:  Head: Normocephalic and atraumatic.  Mucous membranes dry  Eyes: Conjunctivae are normal. Pupils are equal, round, and reactive to light.  Neck: Neck supple. No tracheal  deviation present. No thyromegaly present.  Cardiovascular: Normal rate and regular rhythm.   No murmur heard. Pulmonary/Chest: Effort normal and breath sounds normal.  Abdominal: Soft. Bowel sounds are normal. She exhibits no distension. There is tenderness.  Tender over the periumbilical area and bilateral lower quadrants  Musculoskeletal: Normal range of motion. She exhibits no edema or tenderness.  Neurological: She is alert and oriented to person, place, and time. Coordination normal.  Skin: Skin is warm and dry. No rash noted.  Psychiatric: She has a normal mood and affect.  Nursing note and vitals reviewed.   ED Course  Procedures (including critical care time) Labs Review Labs Reviewed  URINE CULTURE  CBC WITH DIFFERENTIAL/PLATELET  COMPREHENSIVE METABOLIC PANEL  URINALYSIS, ROUTINE W REFLEX MICROSCOPIC  I-STAT CG4 LACTIC ACID, ED    Imaging Review No results found.   EKG Interpretation   Date/Time:  Monday October 19 2014 15:05:27 EDT Ventricular Rate:  75  PR Interval:  143 QRS Duration: 95 QT Interval:  396 QTC Calculation: 442 R Axis:   73 Text Interpretation:  Sinus rhythm No significant change since last  tracing Confirmed by Shanterica Biehler  MD, Salif Tay [phone]) on 10/19/2014 3:10:25 PM     5:15 PM pain and nausea improved after treatment with intravenous fluids, antiemetics and opioids. Results for orders placed or performed during the hospital encounter of 10/19/14  CBC WITH DIFFERENTIAL  Result Value Ref Range   WBC 5.8 4.0 - 10.5 K/uL   RBC 3.99 3.87 - 5.11 MIL/uL   Hemoglobin 13.5 12.0 - 15.0 g/dL   HCT 38.5 36.0 - 46.0 %   MCV 96.5 78.0 - 100.0 fL   MCH 33.8 26.0 - 34.0 pg   MCHC 35.1 30.0 - 36.0 g/dL   RDW 13.6 11.5 - 15.5 %   Platelets 184 150 - 400 K/uL   Neutrophils Relative % 66 43 - 77 %   Neutro Abs 3.9 1.7 - 7.7 K/uL   Lymphocytes Relative 23 12 - 46 %   Lymphs Abs 1.3 0.7 - 4.0 K/uL   Monocytes Relative 10 3 - 12 %   Monocytes Absolute 0.6  0.1 - 1.0 K/uL   Eosinophils Relative 1 0 - 5 %   Eosinophils Absolute 0.0 0.0 - 0.7 K/uL   Basophils Relative 0 0 - 1 %   Basophils Absolute 0.0 0.0 - 0.1 K/uL  Comprehensive metabolic panel  Result Value Ref Range   Sodium 136 135 - 145 mmol/L   Potassium 3.3 (L) 3.5 - 5.1 mmol/L   Chloride 100 96 - 112 mmol/L   CO2 25 19 - 32 mmol/L   Glucose, Bld 98 70 - 99 mg/dL   BUN 8 6 - 23 mg/dL   Creatinine, Ser 1.00 0.50 - 1.10 mg/dL   Calcium 9.0 8.4 - 10.5 mg/dL   Total Protein 7.2 6.0 - 8.3 g/dL   Albumin 4.2 3.5 - 5.2 g/dL   AST 31 0 - 37 U/L   ALT 12 0 - 35 U/L   Alkaline Phosphatase 69 39 - 117 U/L   Total Bilirubin 0.7 0.3 - 1.2 mg/dL   GFR calc non Af Amer 54 (L) >90 mL/min   GFR calc Af Amer 62 (L) >90 mL/min   Anion gap 11 5 - 15  Urinalysis with microscopic  Result Value Ref Range   Color, Urine YELLOW YELLOW   APPearance CLEAR CLEAR   Specific Gravity, Urine 1.009 1.005 - 1.030   pH 7.5 5.0 - 8.0   Glucose, UA NEGATIVE NEGATIVE mg/dL   Hgb urine dipstick NEGATIVE NEGATIVE   Bilirubin Urine NEGATIVE NEGATIVE   Ketones, ur NEGATIVE NEGATIVE mg/dL   Protein, ur NEGATIVE NEGATIVE mg/dL   Urobilinogen, UA 0.2 0.0 - 1.0 mg/dL   Nitrite NEGATIVE NEGATIVE   Leukocytes, UA NEGATIVE NEGATIVE  I-Stat CG4 Lactic Acid, ED  Result Value Ref Range   Lactic Acid, Venous 0.87 0.5 - 2.0 mmol/L   Ct Abdomen Pelvis Wo Contrast  10/16/2014   CLINICAL DATA:  Shortness of breath, weakness for 4 days, nausea starting yesterday, lower abdominal pain  EXAM: CT ABDOMEN AND PELVIS WITHOUT CONTRAST  TECHNIQUE: Multidetector CT imaging of the abdomen and pelvis was performed following the standard protocol without IV contrast.  COMPARISON:  None.  FINDINGS: Sagittal images of the spine shows osteopenia. Degenerative changes thoracolumbar spine.  Unenhanced liver shows no biliary ductal dilatation. No calcified gallstones are noted within gallbladder. Atherosclerotic calcifications  of splenic  artery. Atherosclerotic calcifications of abdominal aorta and iliac arteries.  No aortic aneurysm. The pancreas spleen and adrenal glands are unremarkable.  There is atrophic left kidney with cortical thinning. There is a nonobstructive calculus in lower pole of the left kidney measures 2.8 mm. Bilateral no hydronephrosis or hydroureter. No calcified ureteral calculi are noted bilaterally. Urinary bladder is unremarkable.  No small bowel obstruction.  No ascites or free air.  No adenopathy.  The patient is status post appendectomy. No pericecal inflammation. The terminal ileum is unremarkable. No distal colonic obstruction. The patient is status post hysterectomy.  No calcified calculi are noted within urinary bladder.  IMPRESSION: 1. There is asymmetric atrophic left kidney with cortical thinning. Nonobstructive calcified calculus in lower pole of the left kidney measures 2.8 mm. 2. No hydronephrosis or hydroureter bilaterally. No calcified ureteral calculi are noted. 3. Status post appendectomy.  No pericecal inflammation. 4. No small bowel obstruction. 5. Status post hysterectomy. No calcified calculi are noted within urinary bladder. 6. Osteopenia and degenerative changes thoracolumbar spine.   Electronically Signed   By: Lahoma Crocker M.D.   On: 10/16/2014 17:28   Dg Chest Port 1 View  10/16/2014   CLINICAL DATA:  Shortness of breath. Weakness for the past 2 days. Nausea.  EXAM: PORTABLE CHEST - 1 VIEW  COMPARISON:  03/23/2014  FINDINGS: Lower cervical plate and screw fixator. Dextroconvex upper thoracic scoliosis and levoconvex thoracolumbar scoliosis with spondylosis.  Mild scarring along the right hemidiaphragm. Mild chronic interstitial accentuation bilaterally. No pleural effusion identified. Central airway thickening.  IMPRESSION: 1. Airway thickening is present, suggesting bronchitis or reactive airways disease. 2. Chronic interstitial lung disease, fine reticular opacities bilaterally. 3. Chronic  scarring at the right lung base. 4. Thoracolumbar scoliosis.  Thoracic spondylosis.   Electronically Signed   By: Van Clines M.D.   On: 10/16/2014 14:35   Dg Abd Acute W/chest  10/19/2014   CLINICAL DATA:  Bronchiolitis in urinary tract infection treated on 04/08/ 16. Increased weakness.  EXAM: DG ABDOMEN ACUTE W/ 1V CHEST  COMPARISON:  CT abdomen 10/16/2014 all  FINDINGS: Normal cardiac silhouette. Lungs are clear. There is a reticular nodular density at the left lung base slightly increased from prior.  No dilated loops of large or small bowel. Gas the rectum. No pathologic calcifications. No organomegaly. Degenerate change the spine.  IMPRESSION: 1. Left lower lobe atelectasis versus infiltrate. 2. No bowel obstruction or intraperitoneal free air.   Electronically Signed   By: Suzy Bouchard M.D.   On: 10/19/2014 16:42    MDM  Pt is clinically dehydrated with decreased oral intake. Spoke with Dr.Rumley. Plan transfer to [facility] hospital 23 hour observation medical surgical floor. X-rays reviewed by me Doubt pneumonia. No cough. Lungs clear to auscultation. No leukocytosis or fever Diagnosis #1 abdominal pain #2 dehydration #3hypokalemia #4diarrhea Final diagnoses:  None        Orlie Dakin, MD 10/19/14 1718  Orlie Dakin, MD 10/19/14 1718

## 2014-10-19 NOTE — ED Notes (Signed)
Pt seen in ED on 4/8 treated for UTI and bronchtitis. Family reports pt is getting worse, increased weakness, having bowel movements on herself, which is not normal. Abdominal pain. Pain 8/10. Family reports pt is struggling to keep her eyes open.

## 2014-10-19 NOTE — ED Notes (Signed)
Patient transported to X-ray 

## 2014-10-20 ENCOUNTER — Encounter (HOSPITAL_COMMUNITY): Payer: Self-pay | Admitting: *Deleted

## 2014-10-20 ENCOUNTER — Telehealth (HOSPITAL_BASED_OUTPATIENT_CLINIC_OR_DEPARTMENT_OTHER): Payer: Self-pay | Admitting: Emergency Medicine

## 2014-10-20 DIAGNOSIS — R1084 Generalized abdominal pain: Secondary | ICD-10-CM

## 2014-10-20 DIAGNOSIS — R112 Nausea with vomiting, unspecified: Secondary | ICD-10-CM | POA: Insufficient documentation

## 2014-10-20 DIAGNOSIS — R197 Diarrhea, unspecified: Secondary | ICD-10-CM | POA: Insufficient documentation

## 2014-10-20 DIAGNOSIS — E860 Dehydration: Secondary | ICD-10-CM

## 2014-10-20 DIAGNOSIS — R1110 Vomiting, unspecified: Secondary | ICD-10-CM

## 2014-10-20 LAB — BASIC METABOLIC PANEL
Anion gap: 6 (ref 5–15)
BUN: <5 mg/dL — ABNORMAL LOW (ref 6–23)
CO2: 27 mmol/L (ref 19–32)
Calcium: 8.2 mg/dL — ABNORMAL LOW (ref 8.4–10.5)
Chloride: 108 mmol/L (ref 96–112)
Creatinine, Ser: 1 mg/dL (ref 0.50–1.10)
GFR calc Af Amer: 62 mL/min — ABNORMAL LOW (ref >90)
GFR calc non Af Amer: 54 mL/min — ABNORMAL LOW (ref >90)
Glucose, Bld: 98 mg/dL (ref 70–99)
Potassium: 3.2 mmol/L — ABNORMAL LOW (ref 3.5–5.1)
Sodium: 141 mmol/L (ref 135–145)

## 2014-10-20 LAB — CBC
HCT: 34.4 % — ABNORMAL LOW (ref 36.0–46.0)
Hemoglobin: 11.7 g/dL — ABNORMAL LOW (ref 12.0–15.0)
MCH: 33 pg (ref 26.0–34.0)
MCHC: 34 g/dL (ref 30.0–36.0)
MCV: 96.9 fL (ref 78.0–100.0)
Platelets: 146 10*3/uL — ABNORMAL LOW (ref 150–400)
RBC: 3.55 MIL/uL — ABNORMAL LOW (ref 3.87–5.11)
RDW: 14.1 % (ref 11.5–15.5)
WBC: 4.7 10*3/uL (ref 4.0–10.5)

## 2014-10-20 LAB — URINE CULTURE
Colony Count: NO GROWTH
Culture: NO GROWTH

## 2014-10-20 LAB — PHOSPHORUS: Phosphorus: 2.2 mg/dL — ABNORMAL LOW (ref 2.3–4.6)

## 2014-10-20 LAB — MAGNESIUM: Magnesium: 1.4 mg/dL — ABNORMAL LOW (ref 1.5–2.5)

## 2014-10-20 LAB — CLOSTRIDIUM DIFFICILE BY PCR: Toxigenic C. Difficile by PCR: POSITIVE — AB

## 2014-10-20 LAB — OCCULT BLOOD X 1 CARD TO LAB, STOOL: Fecal Occult Bld: NEGATIVE

## 2014-10-20 MED ORDER — POTASSIUM CHLORIDE CRYS ER 20 MEQ PO TBCR
30.0000 meq | EXTENDED_RELEASE_TABLET | Freq: Two times a day (BID) | ORAL | Status: AC
  Administered 2014-10-20 (×2): 30 meq via ORAL
  Filled 2014-10-20 (×4): qty 1

## 2014-10-20 MED ORDER — ACETAMINOPHEN 325 MG PO TABS
650.0000 mg | ORAL_TABLET | Freq: Four times a day (QID) | ORAL | Status: DC | PRN
  Administered 2014-10-20 – 2014-10-27 (×10): 650 mg via ORAL
  Filled 2014-10-20 (×10): qty 2

## 2014-10-20 MED ORDER — METRONIDAZOLE 500 MG PO TABS
500.0000 mg | ORAL_TABLET | Freq: Three times a day (TID) | ORAL | Status: DC
  Administered 2014-10-20 – 2014-10-21 (×3): 500 mg via ORAL
  Filled 2014-10-20 (×3): qty 1

## 2014-10-20 MED ORDER — CEFTRIAXONE SODIUM IN DEXTROSE 20 MG/ML IV SOLN
1.0000 g | INTRAVENOUS | Status: AC
  Administered 2014-10-20: 1 g via INTRAVENOUS
  Filled 2014-10-20: qty 50

## 2014-10-20 MED ORDER — BISMUTH SUBSALICYLATE 262 MG PO CHEW
524.0000 mg | CHEWABLE_TABLET | Freq: Three times a day (TID) | ORAL | Status: DC
  Administered 2014-10-20 – 2014-10-27 (×26): 524 mg via ORAL
  Filled 2014-10-20 (×32): qty 2

## 2014-10-20 NOTE — Progress Notes (Signed)
Continues to note significant diarrhea. C.difficile positive, on day #1 of Flagyl. Ambulating well to bed-side toilet. Notes some improvement in weakness since this morning. Will continue to give fluids and monitor status. Vitals stable. Follow up CBC and BMP in am.  Dr. Gerlean Ren

## 2014-10-20 NOTE — Evaluation (Signed)
Physical Therapy Evaluation Patient Details Name: [name] [name] MRN: [id number] DOB: 03/09/1941 Today's Date: 10/20/2014   History of Present Illness  Pt presented on 10/19/2014 with abdominal pain and diarrhea which had been persistent since 4/6 and was positive for C-dff.  PMH:  CKD, PVD, arthritis, HTN, depression, anxiety, fibromyalgia  Clinical Impression  Pt is s/p hospitalization for diarrhea and vomiting, resulting in the deficits listed below (see PT Problem List). Pt continues to need to go to the bathroom ever 5-10 minutes, and demonstrates extreme weakness and fatigue due to acute illness. Prior to admission, patient was living with daughter.  Daughter is able to provide 24/7 supervision, but recently had hip replaced so she will not be able to physically help the patient.  Recommend ST-SNF due to the fact that pt requires assist for all functional mobility and has mild confusion. Pt will benefit from skilled PT to increase their independence and safety with mobility to allow discharge to the venue listed below.      Follow Up Recommendations SNF    Equipment Recommendations  None recommended by PT    Recommendations for Other Services       Precautions / Restrictions Precautions Precautions: Fall Precaution Comments: Pt had an episode of weakness/dizziness with ambulation, had to use chair for quick termination of ambulation  Restrictions Weight Bearing Restrictions: No      Mobility  Bed Mobility Overal bed mobility: Needs Assistance Bed Mobility: Supine to Sit     Supine to sit: Supervision     General bed mobility comments: Able to move well, urgency to use restroom  Transfers Overall transfer level: Needs assistance Equipment used: Rolling walker (2 wheeled) Transfers: Sit to/from [facility] Sit to Stand: Min assist Stand pivot transfers: Min assist       General transfer comment: v/c's for safe hand placement to get from sit to  stand  Ambulation/Gait Ambulation/Gait assistance: Min assist Ambulation Distance (Feet): 10 Feet Assistive device: Rolling walker (2 wheeled) Gait Pattern/deviations: Step-through pattern;Decreased stride length Gait velocity: Slow Gait velocity interpretation: Below normal speed for age/gender General Gait Details: Ambulation limited by feeling faint/weak, had to stop quickly and sit  Stairs            Wheelchair Mobility    Modified Rankin (Stroke Patients Only)       Balance Overall balance assessment: Needs assistance         Standing balance support: Bilateral upper extremity supported;During functional activity Standing balance-Leahy Scale: Fair Standing balance comment: Balance and functional activity limited by weakness and feeling faint                             Pertinent Vitals/Pain Pain Assessment: Faces Faces Pain Scale: Hurts even more Pain Location: Abdomen Pain Descriptors / Indicators: Cramping Pain Intervention(s): Monitored during session    Home Living Family/patient expects to be discharged to:: Private residence Living Arrangements: Children Available Help at Discharge: Family;Available 24 hours/day (However, daughter just had hip replaced) Type of Home: House Home Access: Stairs to enter   [employer] of Steps: 4 Home Layout: One level Home Equipment: Walker - 4 wheels;Cane - single point Agricultural consultant)      Prior Function Level of Independence: Independent with assistive device(s)         Comments: Used Rollator for community ambulation     Hand Dominance        Extremity/Trunk Assessment   Upper  Extremity Assessment: Generalized weakness           Lower Extremity Assessment: Generalized weakness      Cervical / Trunk Assessment: Normal  Communication   Communication: No difficulties  Cognition Arousal/Alertness: Awake/alert Behavior During Therapy: WFL for tasks  assessed/performed Overall Cognitive Status: No family/caregiver present to determine baseline cognitive functioning (pt giving conflicting PLOF and PTA status)                      General Comments General comments (skin integrity, edema, etc.): Pt got dizzy/lightheaded with ambulation    Exercises        Assessment/Plan    PT Assessment Patient needs continued PT services  PT Diagnosis Difficulty walking;Abnormality of gait;Generalized weakness;Acute pain   PT Problem List Decreased strength;Decreased range of motion;Decreased activity tolerance;Decreased balance;Decreased mobility;Decreased coordination;Decreased safety awareness  PT Treatment Interventions Gait training;Stair training;Functional mobility training;Therapeutic activities;Therapeutic exercise;Balance training;Patient/family education   PT Goals (Current goals can be found in the Care Plan section) Acute Rehab PT Goals Patient Stated Goal: Return home PT Goal Formulation: With patient Time For Goal Achievement: 11/03/14 Potential to Achieve Goals: Fair    Frequency Min 3X/week   Barriers to discharge Decreased caregiver support Daughter just had hip replaced recently, so will not physically be able to take care of pt    Co-evaluation               End of Session Equipment Utilized During Treatment: Gait belt Activity Tolerance: Patient limited by fatigue Patient left: in chair;with call bell/phone within reach Nurse Communication: Mobility status;Other (comment) (dizziness with ambulation, PT recommendations)         Time: 5732-2025 PT Time Calculation (min) (ACUTE ONLY): 30 min   Charges:   PT Evaluation $Initial PT Evaluation Tier I: 1 Procedure PT Treatments $Gait Training: 8-22 mins   PT G Codes:        Josiel Gahm 11/10/14, 5:07 PM  Lucas Mallow, SPT (student physical therapist) Office phone: [phone]

## 2014-10-20 NOTE — Progress Notes (Signed)
Notified by micro that c. Diff was positive. MD paged to make aware. Graceann Congress

## 2014-10-20 NOTE — Telephone Encounter (Signed)
Post ED Visit - Positive Culture Follow-up  Culture report reviewed by antimicrobial stewardship pharmacist: []  Wes Dulaney, Pharm.D., BCPS [x]  Heide Guile, Pharm.D., BCPS []  Alycia Rossetti, Pharm.D., BCPS []  [place], Pharm.D., BCPS, AAHIVP []  Legrand Como, Pharm.D., BCPS, AAHIVP []  Isac Sarna, Pharm.D., BCPS  Positive urine culture E. Coli Treated with azithromycin, bactrim, organism sensitive to the same and no further patient follow-up is required at this time.  Hazle Nordmann 10/20/2014, 10:12 AM

## 2014-10-20 NOTE — Progress Notes (Signed)
Family Medicine Teaching Service Daily Progress Note Intern Pager: [phone]  Patient name: [name] [name] record number: [id number] Date of birth: 1940/10/21 Age: 74 y.o. Gender: female  Primary Care Provider: Kenn File, MD Consultants: None Code Status: Partial  Assessment and Plan: 74 y.o. female presenting with low abdominal pain and weakness. PMH is significant for OSA on CPAP, HTN, HLD, PVD.   # E. coli UTI: Pan-sensitive on 4/8 culture. Treated with augmentin (repeat UA today is negative) though this may be causing antibiotic-associated diarrhea. No SIRS, lactate 0.87.  - Give CTX with last dose today - Follow urine culture - PT/OT  # Diarrhea/dehydration: Coincident with abx beginning. With expected electrolyte abnormalities.  - CDiff by PCR positive.  - Enteric precautions - Replete volume and electrolytes as indicated - Hold diuretic - Flagyl 500mg  q8hr  # Hypokalemia: Potassium 3.3 at admission - Potassium 3.2 this am - Replete.  - 66mEq KCl in D5 1/2NS @110   # Weakness: Acute component due to UTI and dehydration due to poor po in addition to general deconditioning. She will require physical therapy.  - PT/OT to evaluate and treat; SW not yet consulted pending recommendations. Pt amenable to short term rehabilitation if required.   # HTN/PVD/HLD: Continue plavix. Hold chlorthalidone given dehydration and hypokalemia. Continue fenofibrate (statin intolerant).  # Neuropathy: Continue neurontin and tramadol # Depression/anxiety: Continue zoloft and trazodone  FEN/GI: Heart healthy diet, ADAT; D5 1/2 NS with 63mEq KCl @ 110 Prophylaxis: Subcutaneous heparin  Disposition: Home with PT vs. Rehab  Subjective:  Feels better than yesterday, but still feels weak.  Notes occasional suprapubic abdominal pain, improved since antibiotics initiated. Denies dysuria. Admits to diarrhea since starting antibiotics. Does not wish to go to rehab at this time, but will  continue to consider it over the next few days.  Objective: Temp:  [98 F (36.7 C)-99 F (37.2 C)] 98.6 F (37 C) (04/12 0451) Pulse Rate:  [60-91] 63 (04/12 0451) Resp:  [15-19] 19 (04/12 0451) BP: (113-162)/(18-105) 113/52 mmHg (04/12 0451) SpO2:  [93 %-100 %] 98 % (04/12 0451) Weight:  [147 lb 4.6 oz (66.81 kg)] 147 lb 4.6 oz (66.81 kg) (04/11 2306) Physical Exam: General: 74yo female resting comfortably, appears weak Cardiovascular: S1 and S2 noted, regular rate and rhythm Respiratory: Clear to auscultation bilaterally. No wheezes. No increased work of breathing Abdomen: Soft and nondistended. Bowel sounds noted. Suprapubic tenderness Extremities: No edema noted  Laboratory:  Recent Labs Lab 10/19/14 1457 10/19/14 2103 10/20/14 0426  WBC 5.8 6.3 4.7  HGB 13.5 11.6* 11.7*  HCT 38.5 33.4* 34.4*  PLT 184 168 146*    Recent Labs Lab 10/16/14 1356 10/16/14 1419 10/19/14 1457 10/19/14 2103 10/20/14 0426  NA 137  --  136  --  141  K 3.0*  --  3.3*  --  3.2*  CL 99  --  100  --  108  CO2 25  --  25  --  27  BUN 14  --  8  --  <5*  CREATININE 1.17*  --  1.00 0.98 1.00  CALCIUM 9.7  --  9.0  --  8.2*  PROT  --  7.4 7.2  --   --   BILITOT  --  0.9 0.7  --   --   ALKPHOS  --  73 69  --   --   ALT  --  15 12  --   --   AST  --  27 31  --   --  GLUCOSE 95  --  98  --  98   Imaging/Diagnostic Tests: Ct Abdomen Pelvis Wo Contrast  10/16/2014   CLINICAL DATA:  Shortness of breath, weakness for 4 days, nausea starting yesterday, lower abdominal pain  EXAM: CT ABDOMEN AND PELVIS WITHOUT CONTRAST  TECHNIQUE: Multidetector CT imaging of the abdomen and pelvis was performed following the standard protocol without IV contrast.  COMPARISON:  None.  FINDINGS: Sagittal images of the spine shows osteopenia. Degenerative changes thoracolumbar spine.  Unenhanced liver shows no biliary ductal dilatation. No calcified gallstones are noted within gallbladder. Atherosclerotic calcifications  of splenic artery. Atherosclerotic calcifications of abdominal aorta and iliac arteries.  No aortic aneurysm. The pancreas spleen and adrenal glands are unremarkable.  There is atrophic left kidney with cortical thinning. There is a nonobstructive calculus in lower pole of the left kidney measures 2.8 mm. Bilateral no hydronephrosis or hydroureter. No calcified ureteral calculi are noted bilaterally. Urinary bladder is unremarkable.  No small bowel obstruction.  No ascites or free air.  No adenopathy.  The patient is status post appendectomy. No pericecal inflammation. The terminal ileum is unremarkable. No distal colonic obstruction. The patient is status post hysterectomy.  No calcified calculi are noted within urinary bladder.  IMPRESSION: 1. There is asymmetric atrophic left kidney with cortical thinning. Nonobstructive calcified calculus in lower pole of the left kidney measures 2.8 mm. 2. No hydronephrosis or hydroureter bilaterally. No calcified ureteral calculi are noted. 3. Status post appendectomy.  No pericecal inflammation. 4. No small bowel obstruction. 5. Status post hysterectomy. No calcified calculi are noted within urinary bladder. 6. Osteopenia and degenerative changes thoracolumbar spine.   Electronically Signed   By: Lahoma Crocker M.D.   On: 10/16/2014 17:28   Dg Chest Port 1 View  10/16/2014   CLINICAL DATA:  Shortness of breath. Weakness for the past 2 days. Nausea.  EXAM: PORTABLE CHEST - 1 VIEW  COMPARISON:  03/23/2014  FINDINGS: Lower cervical plate and screw fixator. Dextroconvex upper thoracic scoliosis and levoconvex thoracolumbar scoliosis with spondylosis.  Mild scarring along the right hemidiaphragm. Mild chronic interstitial accentuation bilaterally. No pleural effusion identified. Central airway thickening.  IMPRESSION: 1. Airway thickening is present, suggesting bronchitis or reactive airways disease. 2. Chronic interstitial lung disease, fine reticular opacities bilaterally. 3.  Chronic scarring at the right lung base. 4. Thoracolumbar scoliosis.  Thoracic spondylosis.   Electronically Signed   By: Van Clines M.D.   On: 10/16/2014 14:35   Dg Abd Acute W/chest  10/19/2014   CLINICAL DATA:  Bronchiolitis in urinary tract infection treated on 04/08/ 16. Increased weakness.  EXAM: DG ABDOMEN ACUTE W/ 1V CHEST  COMPARISON:  CT abdomen 10/16/2014 all  FINDINGS: Normal cardiac silhouette. Lungs are clear. There is a reticular nodular density at the left lung base slightly increased from prior.  No dilated loops of large or small bowel. Gas the rectum. No pathologic calcifications. No organomegaly. Degenerate change the spine.  IMPRESSION: 1. Left lower lobe atelectasis versus infiltrate. 2. No bowel obstruction or intraperitoneal free air.   Electronically Signed   By: Suzy Bouchard M.D.   On: 10/19/2014 16:42   Lorna Few, DO 10/20/2014, 8:46 AM PGY-1, [place] Intern pager: [phone], text pages welcome

## 2014-10-20 NOTE — Progress Notes (Signed)
ANTIBIOTIC CONSULT NOTE - INITIAL  Pharmacy Consult for Ceftriaxone Indication:  UTI  Allergies  Allergen Reactions  . Cyclobenzaprine Itching    Possible reaction per pt  . Statins Swelling, Rash and Other (See Comments)    Swelling involving tongue; also causes muscle pain    Patient Measurements: Height: 4\' 11"  (149.9 cm) Weight: 147 lb 4.6 oz (66.81 kg) IBW/kg (Calculated) : 43.2   Vital Signs: Temp: 98.6 F (37 C) (04/12 0451) Temp Source: Oral (04/12 0451) BP: 113/52 mmHg (04/12 0451) Pulse Rate: 63 (04/12 0451) Intake/Output from previous day: 04/11 0701 - 04/12 0700 In: 1466.5 [P.O.:240; I.V.:1226.5] Out: 4 [Urine:2; Stool:2] Intake/Output from this shift:    Labs:  Recent Labs  10/19/14 1457 10/19/14 2103 10/20/14 0426  WBC 5.8 6.3 4.7  HGB 13.5 11.6* 11.7*  PLT 184 168 146*  CREATININE 1.00 0.98 1.00   Estimated Creatinine Clearance: 40.4 mL/min (by C-G formula based on Cr of 1). No results for input(s): VANCOTROUGH, VANCOPEAK, VANCORANDOM, GENTTROUGH, GENTPEAK, GENTRANDOM, TOBRATROUGH, TOBRAPEAK, TOBRARND, AMIKACINPEAK, AMIKACINTROU, AMIKACIN in the last 72 hours.   Microbiology: Recent Results (from the past 720 hour(s))  Urine culture     Status: None   Collection Time: 10/16/14  4:59 PM  Result Value Ref Range Status   Specimen Description URINE, CLEAN CATCH  Final   Special Requests NONE  Final   Colony Count   Final    >=100,000 COLONIES/ML Performed at [facility]    Culture   Final    ESCHERICHIA COLI Performed at [facility]    Report Status 10/19/2014 FINAL  Final   Organism ID, Bacteria ESCHERICHIA COLI  Final      Susceptibility   Escherichia coli - MIC*    AMPICILLIN <=2 SENSITIVE Sensitive     CEFAZOLIN <=4 SENSITIVE Sensitive     CEFTRIAXONE <=1 SENSITIVE Sensitive     CIPROFLOXACIN <=0.25 SENSITIVE Sensitive     GENTAMICIN <=1 SENSITIVE Sensitive     LEVOFLOXACIN <=0.12 SENSITIVE Sensitive    NITROFURANTOIN <=16 SENSITIVE Sensitive     TOBRAMYCIN <=1 SENSITIVE Sensitive     TRIMETH/SULFA <=20 SENSITIVE Sensitive     PIP/TAZO <=4 SENSITIVE Sensitive     * ESCHERICHIA COLI    Medical History: Past Medical History  Diagnosis Date  . Hypertension   . Heart murmur   . Pneumonia   . Anxiety   . Depression   . Psoriasis   . Arthritis   . Hypercholesteremia   . Congenital absence of one kidney     Pt has right kidney only  . Fibromyalgia   . Carpal tunnel syndrome, bilateral   . Chronic kidney disease     CKD stage III, absence of left kidney  . Peripheral vascular disease     Dr. Benjamine Sprague; right external iliac stent '06 with angioplasty '13  . Polymyalgia rheumatica     Medications:  Scheduled:  . sodium chloride   Intravenous STAT  . clopidogrel  75 mg Oral q morning - 10a  . feeding supplement (ENSURE ENLIVE)  237 mL Oral BID BM  . fenofibrate  160 mg Oral Daily  . gabapentin  200 mg Oral TID  . heparin  5,000 Units Subcutaneous 3 times per day  . pantoprazole  20 mg Oral Daily  . sertraline  100 mg Oral BID  . sodium chloride  1 application Both Eyes TID  . traZODone  50 mg Oral QHS   Assessment: 74 y.o female presented  with low abdominal pain, dehydration. E. coli UTI: Pan-sensitive on 4/8 culture. Treated with augmentin (repeat UA today is negative) though Dr. Bonner Puna notes this may be causing antibiotic-associated diarrhea.  Pharmacy consulted to dose Ceftriaxone for UTI. MD plan ceftriaxone IV while inpatient, then transition to oral keflex as able, given favorable sensitivities.    Plan:  Ceftriaxone 1g IV q24 hours.  This medication does not require adjustment for renal function changes.  Pharmacy will sign off.   Nicole Cella, RPh Clinical Pharmacist Pager: [phone] 10/20/2014,7:15 AM

## 2014-10-21 DIAGNOSIS — N390 Urinary tract infection, site not specified: Secondary | ICD-10-CM

## 2014-10-21 DIAGNOSIS — I10 Essential (primary) hypertension: Secondary | ICD-10-CM

## 2014-10-21 DIAGNOSIS — R1030 Lower abdominal pain, unspecified: Secondary | ICD-10-CM | POA: Insufficient documentation

## 2014-10-21 DIAGNOSIS — E785 Hyperlipidemia, unspecified: Secondary | ICD-10-CM

## 2014-10-21 LAB — BASIC METABOLIC PANEL
Anion gap: 4 — ABNORMAL LOW (ref 5–15)
BUN: <5 mg/dL — ABNORMAL LOW (ref 6–23)
CO2: 27 mmol/L (ref 19–32)
Calcium: 9.4 mg/dL (ref 8.4–10.5)
Chloride: 110 mmol/L (ref 96–112)
Creatinine, Ser: 0.87 mg/dL (ref 0.50–1.10)
GFR calc Af Amer: 74 mL/min — ABNORMAL LOW (ref >90)
GFR calc non Af Amer: 64 mL/min — ABNORMAL LOW (ref >90)
Glucose, Bld: 94 mg/dL (ref 70–99)
Potassium: 5.2 mmol/L — ABNORMAL HIGH (ref 3.5–5.1)
Sodium: 141 mmol/L (ref 135–145)

## 2014-10-21 LAB — CBC
HCT: 35.9 % — ABNORMAL LOW (ref 36.0–46.0)
Hemoglobin: 12.2 g/dL (ref 12.0–15.0)
MCH: 33.3 pg (ref 26.0–34.0)
MCHC: 34 g/dL (ref 30.0–36.0)
MCV: 98.1 fL (ref 78.0–100.0)
Platelets: 166 10*3/uL (ref 150–400)
RBC: 3.66 MIL/uL — ABNORMAL LOW (ref 3.87–5.11)
RDW: 14.1 % (ref 11.5–15.5)
WBC: 7.4 10*3/uL (ref 4.0–10.5)

## 2014-10-21 LAB — FECAL LACTOFERRIN, QUANT: Fecal Lactoferrin: NEGATIVE

## 2014-10-21 MED ORDER — MAGNESIUM SULFATE 2 GM/50ML IV SOLN
2.0000 g | Freq: Once | INTRAVENOUS | Status: AC
  Administered 2014-10-21: 2 g via INTRAVENOUS
  Filled 2014-10-21: qty 50

## 2014-10-21 MED ORDER — SODIUM CHLORIDE 0.45 % IV SOLN
INTRAVENOUS | Status: DC
  Administered 2014-10-21 – 2014-10-26 (×6): via INTRAVENOUS
  Filled 2014-10-21 (×15): qty 1000

## 2014-10-21 MED ORDER — WHITE PETROLATUM GEL
Status: AC
  Filled 2014-10-21: qty 1

## 2014-10-21 MED ORDER — METRONIDAZOLE IN NACL 5-0.79 MG/ML-% IV SOLN
500.0000 mg | Freq: Three times a day (TID) | INTRAVENOUS | Status: DC
  Administered 2014-10-21 – 2014-10-23 (×8): 500 mg via INTRAVENOUS
  Filled 2014-10-21 (×10): qty 100

## 2014-10-21 NOTE — Clinical Social Work Note (Signed)
CSW received consult for patient did not have time to assess will complete in morning.  [name] Balfour, MSW, [place] 10/21/2014 4:38 PM

## 2014-10-21 NOTE — Evaluation (Signed)
Occupational Therapy Evaluation Patient Details Name: [name] [name] MRN: [id number] DOB: 10-15-1940 Today's Date: 10/21/2014    History of Present Illness Pt presented on 10/19/2014 with abdominal pain and diarrhea which had been persistent since 4/6 and was positive for C-dff.  PMH:  CKD, PVD, arthritis, HTN, depression, anxiety, fibromyalgia   Clinical Impression   Pt with decline in function and safety with ADLs and ADL mobility with decreased strength, balance and endurance. Pt with urgency to use the bathroom and abdominal pain, cramping with diarrhea. Pt had episode of vomiting this morning as well; pt's nurse notified. Pt would benefit from acute OT services to address impairments to increase level of function and safety    Follow Up Recommendations  SNF    Equipment Recommendations  None recommended by OT (TBD)    Recommendations for Other Services       Precautions / Restrictions Precautions Precautions: Fall Restrictions Weight Bearing Restrictions: No      Mobility Bed Mobility Overal bed mobility: Needs Assistance Bed Mobility: Supine to Sit;Sit to Supine     Supine to sit: Supervision Sit to supine: Supervision   General bed mobility comments: Able to move well, urgency to use restroom  Transfers Overall transfer level: Needs assistance Equipment used: Rolling walker (2 wheeled);1 person hand held assist Transfers: Sit to/from [facility] Sit to Stand: Min assist Stand pivot transfers: Min assist            Balance Overall balance assessment: Needs assistance Sitting-balance support: No upper extremity supported;Feet supported Sitting balance-Leahy Scale: Good     Standing balance support: Single extremity supported;During functional activity;Bilateral upper extremity supported Standing balance-Leahy Scale: Fair                              ADL Overall ADL's : Needs assistance/impaired     Grooming: Wash/dry  hands;Wash/dry face;Sitting;Set up   Upper Body Bathing: Supervision/ safety;Set up;Sitting   Lower Body Bathing: Moderate assistance   Upper Body Dressing : Set up;Supervision/safety;Sitting   Lower Body Dressing: Moderate assistance   Toilet Transfer: Minimal assistance;BSC   Toileting- Clothing Manipulation and Hygiene: Minimal assistance;Sit to/from stand       Functional mobility during ADLs: Minimal assistance       Vision  reading glasses, no change from baseline              Pertinent Vitals/Pain Pain Assessment: 0-10 Pain Score: 5  Pain Location: abdomen, rectum Pain Descriptors / Indicators: Burning;Cramping Pain Intervention(s): Limited activity within patient's tolerance;Monitored during session;Repositioned     Hand Dominance Right   Extremity/Trunk Assessment Upper Extremity Assessment Upper Extremity Assessment: Generalized weakness   Lower Extremity Assessment Lower Extremity Assessment: Defer to PT evaluation   Cervical / Trunk Assessment Cervical / Trunk Assessment: Normal   Communication Communication Communication: No difficulties   Cognition Arousal/Alertness: Awake/alert Behavior During Therapy: WFL for tasks assessed/performed Overall Cognitive Status: Within Functional Limits for tasks assessed                     General Comments   pt pleasant and cooperative                 Home Living Family/patient expects to be discharged to:: Private residence Living Arrangements: Children Available Help at Discharge: Family;Available 24 hours/day Type of Home: House Home Access: Stairs to enter [employer] of Steps: 4 Entrance Stairs-Rails: Right Home Layout: One  level     Bathroom Shower/Tub: Teacher, early years/pre: Standard     Home Equipment: Environmental consultant - 4 wheels;Cane - single point   Additional Comments: daughter lives with pt 24/7 per patient      Prior Functioning/Environment Level of  Independence: Independent with assistive device(s)        Comments: Used Radiation protection practitioner for community ambulation    OT Diagnosis: Generalized weakness;Acute pain   OT Problem List: Decreased strength;Decreased knowledge of use of DME or AE;Decreased activity tolerance;Impaired balance (sitting and/or standing)   OT Treatment/Interventions: Self-care/ADL training;Patient/family education;Therapeutic activities;DME and/or AE instruction    OT Goals(Current goals can be found in the care plan section) Acute Rehab OT Goals Patient Stated Goal: get better and go home OT Goal Formulation: With patient Time For Goal Achievement: 10/28/14 Potential to Achieve Goals: Good ADL Goals Pt Will Perform Grooming: with min guard assist;with supervision;with set-up;standing Pt Will Perform Lower Body Bathing: with min assist;sit to/from stand Pt Will Perform Lower Body Dressing: with min assist;sit to/from stand Pt Will Transfer to Toilet: with min guard assist;with supervision;regular height toilet;ambulating;bedside commode;grab bars Pt Will Perform Toileting - Clothing Manipulation and hygiene: with min guard assist;with supervision;sit to/from stand  OT Frequency: Min 2X/week   Barriers to D/C: Decreased caregiver support                        End of Session Equipment Utilized During Treatment: Other (comment);Rolling walker (BSC)  Activity Tolerance: Other (comment);Patient limited by pain Patient left: in bed;with call bell/phone within reach;with nursing/sitter in room   Time: 0921-0953 OT Time Calculation (min): 32 min Charges:  OT General Charges $OT Visit: 1 Procedure OT Evaluation $Initial OT Evaluation Tier I: 1 Procedure OT Treatments $Self Care/Home Management : 8-22 mins $Therapeutic Activity: 8-22 mins G-Codes:    Britt Bottom 10/21/2014, 11:54 AM

## 2014-10-21 NOTE — Progress Notes (Signed)
Family Medicine Teaching Service Daily Progress Note Intern Pager: [phone]  Patient name: [name] [name] record number: [id number] Date of birth: 18-Oct-1940 Age: 74 y.o. Gender: female  Primary Care Provider: Kenn File, MD Consultants: None Code Status: Partial  Assessment and Plan: 74 y.o. female presenting with low abdominal pain and weakness. PMH is significant for OSA on CPAP, HTN, HLD, PVD.   # E. coli UTI: Pan-sensitive on 4/8 culture. Treated with augmentin (repeat UA today is negative) though this may be causing antibiotic-associated diarrhea. No SIRS, lactate 0.87.  - s/p tx with Augmentin and CTX, last dose 4/12 - Urine Culture- No Growth - PT/OT- SNF  # Diarrhea/dehydration: Coincident with abx beginning. With expected electrolyte abnormalities.  - C. Diff by PCR positive.  - Enteric precautions - Replete volume and electrolytes as indicated - Hold diuretic - Flagyl 500mg  q8hr--transition from PO to IV  # Hyperkalemia: Potassium 3.3 at admission - Potassium 5.2 this am - Replete.  - 5mEq KCl in D5 1/2NS @110 --Discontinueing  # Weakness: Acute component due to UTI and dehydration due to poor po in addition to general deconditioning. She will require physical therapy.  - PT/OT to evaluate and treat; SW not yet consulted pending recommendations. Pt amenable to short term rehabilitation if required.   # HTN/PVD/HLD: Continue plavix. Hold chlorthalidone given dehydration and hypokalemia. Continue fenofibrate (statin intolerant).  # Neuropathy: Continue neurontin and tramadol # Depression/anxiety: Continue zoloft and trazodone  FEN/GI: Heart healthy diet, ADAT Prophylaxis: Subcutaneous heparin  Disposition: Home with PT vs. Rehab  Subjective:  Notes significant diarrhea, abdominal cramps, and vomiting. Still weak.  Objective: Temp:  [98 F (36.7 C)-98.7 F (37.1 C)] 98 F (36.7 C) (04/13 0604) Pulse Rate:  [64-77] 72 (04/13 0604) Resp:  [16-20]  20 (04/13 0604) BP: (126-147)/(58-78) 139/78 mmHg (04/13 0604) SpO2:  [97 %-100 %] 97 % (04/13 0604) Physical Exam: General: 74yo female resting comfortably, appears weak Cardiovascular: S1 and S2 noted, regular rate and rhythm Respiratory: Clear to auscultation bilaterally. No wheezes. No increased work of breathing Abdomen: Soft and nondistended. Hyperactive bowel sounds noted.  Extremities: No edema noted  Laboratory:  Recent Labs Lab 10/19/14 1457 10/19/14 2103 10/20/14 0426  WBC 5.8 6.3 4.7  HGB 13.5 11.6* 11.7*  HCT 38.5 33.4* 34.4*  PLT 184 168 146*    Recent Labs Lab 10/16/14 1356 10/16/14 1419 10/19/14 1457 10/19/14 2103 10/20/14 0426  NA 137  --  136  --  141  K 3.0*  --  3.3*  --  3.2*  CL 99  --  100  --  108  CO2 25  --  25  --  27  BUN 14  --  8  --  <5*  CREATININE 1.17*  --  1.00 0.98 1.00  CALCIUM 9.7  --  9.0  --  8.2*  PROT  --  7.4 7.2  --   --   BILITOT  --  0.9 0.7  --   --   ALKPHOS  --  73 69  --   --   ALT  --  15 12  --   --   AST  --  27 31  --   --   GLUCOSE 95  --  98  --  98   Imaging/Diagnostic Tests: Ct Abdomen Pelvis Wo Contrast  10/16/2014   CLINICAL DATA:  Shortness of breath, weakness for 4 days, nausea starting yesterday, lower abdominal pain  EXAM: CT ABDOMEN AND PELVIS  WITHOUT CONTRAST  TECHNIQUE: Multidetector CT imaging of the abdomen and pelvis was performed following the standard protocol without IV contrast.  COMPARISON:  None.  FINDINGS: Sagittal images of the spine shows osteopenia. Degenerative changes thoracolumbar spine.  Unenhanced liver shows no biliary ductal dilatation. No calcified gallstones are noted within gallbladder. Atherosclerotic calcifications of splenic artery. Atherosclerotic calcifications of abdominal aorta and iliac arteries.  No aortic aneurysm. The pancreas spleen and adrenal glands are unremarkable.  There is atrophic left kidney with cortical thinning. There is a nonobstructive calculus in lower pole  of the left kidney measures 2.8 mm. Bilateral no hydronephrosis or hydroureter. No calcified ureteral calculi are noted bilaterally. Urinary bladder is unremarkable.  No small bowel obstruction.  No ascites or free air.  No adenopathy.  The patient is status post appendectomy. No pericecal inflammation. The terminal ileum is unremarkable. No distal colonic obstruction. The patient is status post hysterectomy.  No calcified calculi are noted within urinary bladder.  IMPRESSION: 1. There is asymmetric atrophic left kidney with cortical thinning. Nonobstructive calcified calculus in lower pole of the left kidney measures 2.8 mm. 2. No hydronephrosis or hydroureter bilaterally. No calcified ureteral calculi are noted. 3. Status post appendectomy.  No pericecal inflammation. 4. No small bowel obstruction. 5. Status post hysterectomy. No calcified calculi are noted within urinary bladder. 6. Osteopenia and degenerative changes thoracolumbar spine.   Electronically Signed   By: Lahoma Crocker M.D.   On: 10/16/2014 17:28   Dg Chest Port 1 View  10/16/2014   CLINICAL DATA:  Shortness of breath. Weakness for the past 2 days. Nausea.  EXAM: PORTABLE CHEST - 1 VIEW  COMPARISON:  03/23/2014  FINDINGS: Lower cervical plate and screw fixator. Dextroconvex upper thoracic scoliosis and levoconvex thoracolumbar scoliosis with spondylosis.  Mild scarring along the right hemidiaphragm. Mild chronic interstitial accentuation bilaterally. No pleural effusion identified. Central airway thickening.  IMPRESSION: 1. Airway thickening is present, suggesting bronchitis or reactive airways disease. 2. Chronic interstitial lung disease, fine reticular opacities bilaterally. 3. Chronic scarring at the right lung base. 4. Thoracolumbar scoliosis.  Thoracic spondylosis.   Electronically Signed   By: Van Clines M.D.   On: 10/16/2014 14:35   Dg Abd Acute W/chest  10/19/2014   CLINICAL DATA:  Bronchiolitis in urinary tract infection treated  on 04/08/ 16. Increased weakness.  EXAM: DG ABDOMEN ACUTE W/ 1V CHEST  COMPARISON:  CT abdomen 10/16/2014 all  FINDINGS: Normal cardiac silhouette. Lungs are clear. There is a reticular nodular density at the left lung base slightly increased from prior.  No dilated loops of large or small bowel. Gas the rectum. No pathologic calcifications. No organomegaly. Degenerate change the spine.  IMPRESSION: 1. Left lower lobe atelectasis versus infiltrate. 2. No bowel obstruction or intraperitoneal free air.   Electronically Signed   By: Suzy Bouchard M.D.   On: 10/19/2014 16:42   Lorna Few, DO 10/21/2014, 8:52 AM PGY-1, [place] Intern pager: [phone], text pages welcome

## 2014-10-22 DIAGNOSIS — A047 Enterocolitis due to Clostridium difficile: Principal | ICD-10-CM

## 2014-10-22 DIAGNOSIS — A0472 Enterocolitis due to Clostridium difficile, not specified as recurrent: Secondary | ICD-10-CM | POA: Insufficient documentation

## 2014-10-22 DIAGNOSIS — I739 Peripheral vascular disease, unspecified: Secondary | ICD-10-CM

## 2014-10-22 LAB — BASIC METABOLIC PANEL
Anion gap: 8 (ref 5–15)
BUN: <5 mg/dL — ABNORMAL LOW (ref 6–23)
CO2: 24 mmol/L (ref 19–32)
Calcium: 9 mg/dL (ref 8.4–10.5)
Chloride: 108 mmol/L (ref 96–112)
Creatinine, Ser: 0.88 mg/dL (ref 0.50–1.10)
GFR calc Af Amer: 73 mL/min — ABNORMAL LOW (ref >90)
GFR calc non Af Amer: 63 mL/min — ABNORMAL LOW (ref >90)
Glucose, Bld: 87 mg/dL (ref 70–99)
Potassium: 4.5 mmol/L (ref 3.5–5.1)
Sodium: 140 mmol/L (ref 135–145)

## 2014-10-22 LAB — CBC
HCT: 34.6 % — ABNORMAL LOW (ref 36.0–46.0)
Hemoglobin: 11.6 g/dL — ABNORMAL LOW (ref 12.0–15.0)
MCH: 33.5 pg (ref 26.0–34.0)
MCHC: 33.5 g/dL (ref 30.0–36.0)
MCV: 100 fL (ref 78.0–100.0)
Platelets: 147 10*3/uL — ABNORMAL LOW (ref 150–400)
RBC: 3.46 MIL/uL — ABNORMAL LOW (ref 3.87–5.11)
RDW: 14.4 % (ref 11.5–15.5)
WBC: 5.4 10*3/uL (ref 4.0–10.5)

## 2014-10-22 MED ORDER — DICYCLOMINE HCL 10 MG PO CAPS
10.0000 mg | ORAL_CAPSULE | Freq: Three times a day (TID) | ORAL | Status: DC
  Administered 2014-10-22 – 2014-10-27 (×15): 10 mg via ORAL
  Filled 2014-10-22 (×15): qty 1

## 2014-10-22 NOTE — Progress Notes (Signed)
Family Medicine Teaching Service Daily Progress Note Intern Pager: [phone]  Patient name: [name] [name] record number: [id number] Date of birth: 1941/02/17 Age: 74 y.o. Gender: female  Primary Care Provider: Kenn File, MD Consultants: None Code Status: Partial  Assessment and Plan: 74 y.o. female presenting with low abdominal pain and weakness. PMH is significant for OSA on CPAP, HTN, HLD, PVD.   # Diarrhea/dehydration:  - CDiff by PCR positive.  - Enteric precautions - Replete volume and electrolytes as indicated - Hold diuretic - Flagyl 500mg  q8hr IV. Consider addition of Vancomycin if no improvement tomorrow - Bentyl - PT/OT  # Weakness: Acute component due to UTI and dehydration due to poor po in addition to general deconditioning. She will require physical therapy.  - PT/OT to evaluate and treat.   # HTN/PVD/HLD: Continue plavix. Hold chlorthalidone given dehydration and hypokalemia. Continue fenofibrate (statin intolerant).  # Neuropathy: Continue neurontin and tramadol # Depression/anxiety: Continue zoloft and trazodone  FEN/GI: Heart healthy diet, ADAT; 1/2 NS @110  Prophylaxis: Subcutaneous heparin  Disposition: Home with PT vs. Rehab  Subjective:  Improvement in vomiting, with last episode of vomiting last night. Continues to note diarrhea with significant abdominal cramping.   Objective: Temp:  [98 F (36.7 C)-98.8 F (37.1 C)] 98.8 F (37.1 C) (04/14 0510) Pulse Rate:  [72-80] 74 (04/14 0510) Resp:  [16-20] 18 (04/14 0510) BP: (118-140)/(42-79) 118/42 mmHg (04/14 0510) SpO2:  [95 %-98 %] 95 % (04/14 0510) Physical Exam: General: 74yo female resting comfortably, appears weak Cardiovascular: S1 and S2 noted, regular rate and rhythm Respiratory: Clear to auscultation bilaterally. No wheezes. No increased work of breathing Abdomen: Soft and nondistended. Bowel sounds noted.  Extremities: No edema noted  Laboratory:  Recent Labs Lab  10/19/14 2103 10/20/14 0426 10/21/14 1230  WBC 6.3 4.7 7.4  HGB 11.6* 11.7* 12.2  HCT 33.4* 34.4* 35.9*  PLT 168 146* 166    Recent Labs Lab 10/16/14 1419 10/19/14 1457 10/19/14 2103 10/20/14 0426 10/21/14 1128  NA  --  136  --  141 141  K  --  3.3*  --  3.2* 5.2*  CL  --  100  --  108 110  CO2  --  25  --  27 27  BUN  --  8  --  <5* <5*  CREATININE  --  1.00 0.98 1.00 0.87  CALCIUM  --  9.0  --  8.2* 9.4  PROT 7.4 7.2  --   --   --   BILITOT 0.9 0.7  --   --   --   ALKPHOS 73 69  --   --   --   ALT 15 12  --   --   --   AST 27 31  --   --   --   GLUCOSE  --  98  --  98 94   Imaging/Diagnostic Tests: Ct Abdomen Pelvis Wo Contrast  10/16/2014   CLINICAL DATA:  Shortness of breath, weakness for 4 days, nausea starting yesterday, lower abdominal pain  EXAM: CT ABDOMEN AND PELVIS WITHOUT CONTRAST  TECHNIQUE: Multidetector CT imaging of the abdomen and pelvis was performed following the standard protocol without IV contrast.  COMPARISON:  None.  FINDINGS: Sagittal images of the spine shows osteopenia. Degenerative changes thoracolumbar spine.  Unenhanced liver shows no biliary ductal dilatation. No calcified gallstones are noted within gallbladder. Atherosclerotic calcifications of splenic artery. Atherosclerotic calcifications of abdominal aorta and iliac arteries.  No aortic aneurysm. The  pancreas spleen and adrenal glands are unremarkable.  There is atrophic left kidney with cortical thinning. There is a nonobstructive calculus in lower pole of the left kidney measures 2.8 mm. Bilateral no hydronephrosis or hydroureter. No calcified ureteral calculi are noted bilaterally. Urinary bladder is unremarkable.  No small bowel obstruction.  No ascites or free air.  No adenopathy.  The patient is status post appendectomy. No pericecal inflammation. The terminal ileum is unremarkable. No distal colonic obstruction. The patient is status post hysterectomy.  No calcified calculi are noted within  urinary bladder.  IMPRESSION: 1. There is asymmetric atrophic left kidney with cortical thinning. Nonobstructive calcified calculus in lower pole of the left kidney measures 2.8 mm. 2. No hydronephrosis or hydroureter bilaterally. No calcified ureteral calculi are noted. 3. Status post appendectomy.  No pericecal inflammation. 4. No small bowel obstruction. 5. Status post hysterectomy. No calcified calculi are noted within urinary bladder. 6. Osteopenia and degenerative changes thoracolumbar spine.   Electronically Signed   By: Lahoma Crocker M.D.   On: 10/16/2014 17:28   Dg Chest Port 1 View  10/16/2014   CLINICAL DATA:  Shortness of breath. Weakness for the past 2 days. Nausea.  EXAM: PORTABLE CHEST - 1 VIEW  COMPARISON:  03/23/2014  FINDINGS: Lower cervical plate and screw fixator. Dextroconvex upper thoracic scoliosis and levoconvex thoracolumbar scoliosis with spondylosis.  Mild scarring along the right hemidiaphragm. Mild chronic interstitial accentuation bilaterally. No pleural effusion identified. Central airway thickening.  IMPRESSION: 1. Airway thickening is present, suggesting bronchitis or reactive airways disease. 2. Chronic interstitial lung disease, fine reticular opacities bilaterally. 3. Chronic scarring at the right lung base. 4. Thoracolumbar scoliosis.  Thoracic spondylosis.   Electronically Signed   By: Van Clines M.D.   On: 10/16/2014 14:35   Dg Abd Acute W/chest  10/19/2014   CLINICAL DATA:  Bronchiolitis in urinary tract infection treated on 04/08/ 16. Increased weakness.  EXAM: DG ABDOMEN ACUTE W/ 1V CHEST  COMPARISON:  CT abdomen 10/16/2014 all  FINDINGS: Normal cardiac silhouette. Lungs are clear. There is a reticular nodular density at the left lung base slightly increased from prior.  No dilated loops of large or small bowel. Gas the rectum. No pathologic calcifications. No organomegaly. Degenerate change the spine.  IMPRESSION: 1. Left lower lobe atelectasis versus infiltrate.  2. No bowel obstruction or intraperitoneal free air.   Electronically Signed   By: Suzy Bouchard M.D.   On: 10/19/2014 16:42   Lorna Few, DO 10/22/2014, 8:55 AM PGY-1, [place] Intern pager: [phone], text pages welcome

## 2014-10-22 NOTE — Progress Notes (Signed)
Physical Therapy Treatment Patient Details Name: [name] [name] MRN: [id number] DOB: 08/15/1940 Today's Date: 10/22/2014    History of Present Illness Pt presented on 10/19/2014 with abdominal pain and diarrhea which had been persistent since 4/6 and was positive for C-dff.  PMH:  CKD, PVD, arthritis, HTN, depression, anxiety, fibromyalgia    PT Comments    Mobility and ambulation limited today secondary to diarrhea. Pt tolerated SPC to/from [facility] with Min A for balance/support secondary to urgency. Education provided to increase sitting tolerance/exercise while in hospital. Will need to improve ambulation distance next session as tolerated and once frequency of diarrhea decreases. Will continue to follow and progress as tolerated. Continues to be appropriate for ST SNF.  Follow Up Recommendations  SNF     Equipment Recommendations  None recommended by PT    Recommendations for Other Services       Precautions / Restrictions Precautions Precautions: Fall Restrictions Weight Bearing Restrictions: No    Mobility  Bed Mobility Overal bed mobility: Needs Assistance Bed Mobility: Supine to Sit;Sit to Supine     Supine to sit: Modified independent (Device/Increase time) Sit to supine: Modified independent (Device/Increase time)   General bed mobility comments: Able to move well, urgency to use restroom  Transfers Overall transfer level: Needs assistance Equipment used: None Transfers: Sit to/from Stand Sit to Stand: Min assist Stand pivot transfers: Min assist       General transfer comment: Min A for safety as pt urgently trying to get to [facility].   Ambulation/Gait             General Gait Details: Pt declined ambulation today due to frequent and rampant diarrhea.   Stairs            Wheelchair Mobility    Modified Rankin (Stroke Patients Only)       Balance Overall balance assessment: Needs assistance Sitting-balance support: Feet supported;No upper  extremity supported Sitting balance-Leahy Scale: Good     Standing balance support: During functional activity Standing balance-Leahy Scale: Fair Standing balance comment: Tolerated standing with UE support during pericare and applying of barrier cream. No dizziness.               High Level Balance Comments: Encouraged sitting EOB as much as possible and to perform LE exercises throughout the day to improve strength.    Cognition Arousal/Alertness: Awake/alert Behavior During Therapy: WFL for tasks assessed/performed Overall Cognitive Status: Within Functional Limits for tasks assessed                      Exercises      General Comments        Pertinent Vitals/Pain Pain Assessment: Faces Faces Pain Scale: Hurts little more Pain Location: rectum Pain Descriptors / Indicators: Burning;Sore Pain Intervention(s): Monitored during session;Repositioned    Home Living                      Prior Function            PT Goals (current goals can now be found in the care plan section) Progress towards PT goals: Not progressing toward goals - comment (limited due to constant diarrhea)    Frequency  Min 3X/week    PT Plan Current plan remains appropriate    Co-evaluation             End of Session Equipment Utilized During Treatment: Gait belt Activity Tolerance: Patient limited by lethargy;Other (comment) (diarrhea)  Patient left: with call bell/phone within reach;with bed alarm set;in bed     Time: 1586-8257 PT Time Calculation (min) (ACUTE ONLY): 15 min  Charges:  $Therapeutic Activity: 8-22 mins                    G CodesCandy Sledge A 11/18/2014, 3:12 PM Candy Sledge, [place], DPT [phone]

## 2014-10-22 NOTE — Clinical Social Work Note (Signed)
Patient will need Level 2 Passar screen, 30 day note and FL2 on chart awaiting signature by physician.  CSW notified attending physician and informed her forms are both  Physician please sign both forms so Level 2 Passar screen can be completed and faxed to the state of [place].  [name] [place], MSW, [place] 10/22/2014 10:05 PM                                                                                                                          gn

## 2014-10-23 LAB — GI PATHOGEN PANEL BY PCR, STOOL
C difficile toxin A/B: NOT DETECTED
Campylobacter by PCR: NOT DETECTED
Cryptosporidium by PCR: NOT DETECTED
E coli (ETEC) LT/ST: NOT DETECTED
E coli (STEC): NOT DETECTED
E coli 0157 by PCR: NOT DETECTED
G lamblia by PCR: NOT DETECTED
Norovirus GI/GII: NOT DETECTED
Rotavirus A by PCR: NOT DETECTED
Salmonella by PCR: NOT DETECTED
Shigella by PCR: NOT DETECTED

## 2014-10-23 MED ORDER — VANCOMYCIN 50 MG/ML ORAL SOLUTION
125.0000 mg | Freq: Four times a day (QID) | ORAL | Status: DC
  Administered 2014-10-23 – 2014-10-27 (×17): 125 mg via ORAL
  Filled 2014-10-23 (×20): qty 2.5

## 2014-10-23 NOTE — Clinical Social Work Note (Signed)
CSW met with patient to discuss SNF placement.  Patient stated she lives with her daughter and who recently had hip surgery, and patient is agreeable to going to SNF for short term rehab.  CSW informed patient about the process of finding beds and patient stated she has been at [facility] and would like to go back there.  CSW placed FL2 on chart and 30 day passar note awaiting physician signature.  Formal assessment to follow.  [name] [place], MSW, [place] 10/23/2014 12:27 PM

## 2014-10-23 NOTE — Progress Notes (Signed)
Noted patient having orange/red watery stool.

## 2014-10-23 NOTE — Progress Notes (Signed)
Family Medicine Teaching Service Daily Progress Note Intern Pager: [phone]  Patient name: [name] [name] record number: [id number] Date of birth: [birth year] Age: 74 y.o. Gender: female  Primary Care Provider: Kenn File, MD Consultants: None Code Status: Partial  Assessment and Plan: 74 y.o. female presenting with low abdominal pain and weakness. PMH is significant for OSA on CPAP, HTN, HLD, PVD.   # Diarrhea/dehydration:  - CDiff by PCR positive.  - Enteric precautions - Replete volume and electrolytes as indicated - Hold diuretic - Flagyl 500mg  q8hr IV. Will add PO vancomycin today - Bentyl - PT/OT  # Weakness: Acute component due to UTI and dehydration due to poor po in addition to general deconditioning. She will require physical therapy.  - PT/OT to evaluate and treat.   # HTN/PVD/HLD: Continue plavix. Hold chlorthalidone given dehydration and hypokalemia. Continue fenofibrate (statin intolerant).  # Neuropathy: Continue neurontin and tramadol # Depression/anxiety: Continue zoloft and trazodone  FEN/GI: Heart healthy diet, ADAT; 1/2 NS @110  Prophylaxis: Subcutaneous heparin  Disposition:SNF Rehab  Subjective:  Vomiting resolved. Improvement of abdominal pain with Bentyl.   Objective: Temp:  [98.2 F (36.8 C)-99.1 F (37.3 C)] 98.7 F (37.1 C) (04/15 0656) Pulse Rate:  [64-77] 77 (04/15 0656) Resp:  [16-18] 16 (04/15 0656) BP: (118-147)/(47-59) 132/59 mmHg (04/15 0656) SpO2:  [97 %-99 %] 97 % (04/15 0656) Weight:  [147 lb 11.3 oz (67 kg)] 147 lb 11.3 oz (67 kg) (04/15 0656) Physical Exam: General: 74yo female resting comfortably, appears weak Cardiovascular: S1 and S2 noted, regular rate and rhythm Respiratory: Clear to auscultation bilaterally. No wheezes. No increased work of breathing Abdomen: Soft and nondistended. Bowel sounds noted.  Extremities: Mild edema noted  Laboratory:  Recent Labs Lab 10/20/14 0426 10/21/14 1230 10/22/14 1004   WBC 4.7 7.4 5.4  HGB 11.7* 12.2 11.6*  HCT 34.4* 35.9* 34.6*  PLT 146* 166 147*    Recent Labs Lab 10/16/14 1419 10/19/14 1457  10/20/14 0426 10/21/14 1128 10/22/14 1004  NA  --  136  --  141 141 140  K  --  3.3*  --  3.2* 5.2* 4.5  CL  --  100  --  108 110 108  CO2  --  25  --  27 27 24   BUN  --  8  --  <5* <5* <5*  CREATININE  --  1.00  < > 1.00 0.87 0.88  CALCIUM  --  9.0  --  8.2* 9.4 9.0  PROT 7.4 7.2  --   --   --   --   BILITOT 0.9 0.7  --   --   --   --   ALKPHOS 73 69  --   --   --   --   ALT 15 12  --   --   --   --   AST 27 31  --   --   --   --   GLUCOSE  --  98  --  98 94 87  < > = values in this interval not displayed. Imaging/Diagnostic Tests: Ct Abdomen Pelvis Wo Contrast  10/16/2014   CLINICAL DATA:  Shortness of breath, weakness for 4 days, nausea starting yesterday, lower abdominal pain  EXAM: CT ABDOMEN AND PELVIS WITHOUT CONTRAST  TECHNIQUE: Multidetector CT imaging of the abdomen and pelvis was performed following the standard protocol without IV contrast.  COMPARISON:  None.  FINDINGS: Sagittal images of the spine shows osteopenia. Degenerative changes thoracolumbar spine.  Unenhanced liver shows no biliary ductal dilatation. No calcified gallstones are noted within gallbladder. Atherosclerotic calcifications of splenic artery. Atherosclerotic calcifications of abdominal aorta and iliac arteries.  No aortic aneurysm. The pancreas spleen and adrenal glands are unremarkable.  There is atrophic left kidney with cortical thinning. There is a nonobstructive calculus in lower pole of the left kidney measures 2.8 mm. Bilateral no hydronephrosis or hydroureter. No calcified ureteral calculi are noted bilaterally. Urinary bladder is unremarkable.  No small bowel obstruction.  No ascites or free air.  No adenopathy.  The patient is status post appendectomy. No pericecal inflammation. The terminal ileum is unremarkable. No distal colonic obstruction. The patient is status  post hysterectomy.  No calcified calculi are noted within urinary bladder.  IMPRESSION: 1. There is asymmetric atrophic left kidney with cortical thinning. Nonobstructive calcified calculus in lower pole of the left kidney measures 2.8 mm. 2. No hydronephrosis or hydroureter bilaterally. No calcified ureteral calculi are noted. 3. Status post appendectomy.  No pericecal inflammation. 4. No small bowel obstruction. 5. Status post hysterectomy. No calcified calculi are noted within urinary bladder. 6. Osteopenia and degenerative changes thoracolumbar spine.   Electronically Signed   By: Lahoma Crocker M.D.   On: 10/16/2014 17:28   Dg Chest Port 1 View  10/16/2014   CLINICAL DATA:  Shortness of breath. Weakness for the past 2 days. Nausea.  EXAM: PORTABLE CHEST - 1 VIEW  COMPARISON:  03/23/2014  FINDINGS: Lower cervical plate and screw fixator. Dextroconvex upper thoracic scoliosis and levoconvex thoracolumbar scoliosis with spondylosis.  Mild scarring along the right hemidiaphragm. Mild chronic interstitial accentuation bilaterally. No pleural effusion identified. Central airway thickening.  IMPRESSION: 1. Airway thickening is present, suggesting bronchitis or reactive airways disease. 2. Chronic interstitial lung disease, fine reticular opacities bilaterally. 3. Chronic scarring at the right lung base. 4. Thoracolumbar scoliosis.  Thoracic spondylosis.   Electronically Signed   By: Van Clines M.D.   On: 10/16/2014 14:35   Dg Abd Acute W/chest  10/19/2014   CLINICAL DATA:  Bronchiolitis in urinary tract infection treated on 04/08/ 16. Increased weakness.  EXAM: DG ABDOMEN ACUTE W/ 1V CHEST  COMPARISON:  CT abdomen 10/16/2014 all  FINDINGS: Normal cardiac silhouette. Lungs are clear. There is a reticular nodular density at the left lung base slightly increased from prior.  No dilated loops of large or small bowel. Gas the rectum. No pathologic calcifications. No organomegaly. Degenerate change the spine.   IMPRESSION: 1. Left lower lobe atelectasis versus infiltrate. 2. No bowel obstruction or intraperitoneal free air.   Electronically Signed   By: Suzy Bouchard M.D.   On: 10/19/2014 16:42   Lorna Few, DO 10/23/2014, 8:41 AM PGY-1, [place] Intern pager: [phone], text pages welcome

## 2014-10-23 NOTE — Progress Notes (Signed)
Physical Therapy Treatment Patient Details Name: [name] [name] MRN: [id number] DOB: 07-02-1941 Today's Date: 10/23/2014    History of Present Illness Pt presented on 10/19/2014 with abdominal pain and diarrhea which had been persistent since 4/6 and was positive for C-dff.  PMH:  CKD, PVD, arthritis, HTN, depression, anxiety, fibromyalgia    PT Comments    Patient progressing well with mobility. Improved ambulation distance with Min A for balance/safety as pt with LOB. Pt fatigues easily. Continues to have diarrhea limiting ambulation to within room distances. Instructed pt in exercises to perform during the day to improve strength. Will continue to follow per current POC. Appropriate for ST SNF.   Follow Up Recommendations  SNF     Equipment Recommendations  None recommended by PT    Recommendations for Other Services       Precautions / Restrictions Precautions Precautions: Fall Restrictions Weight Bearing Restrictions: No    Mobility  Bed Mobility               General bed mobility comments: Sitting in chair upon PT arrival.   Transfers Overall transfer level: Needs assistance Equipment used: Rolling walker (2 wheeled) Transfers: Sit to/from Stand Sit to Stand: Min assist Stand pivot transfers: Min assist       General transfer comment: Min A to rise secondary to weakness. SPT chair to [facility] Min A for safety.  Ambulation/Gait Ambulation/Gait assistance: Min assist Ambulation Distance (Feet): 40 Feet (x2 bouts) Assistive device: Rolling walker (2 wheeled) Gait Pattern/deviations: Step-through pattern;Decreased stride length;Trunk flexed Gait velocity: Slow   General Gait Details: Pt with unsteady gait. 1 LOB requiring Min A to prevent fall. Fatigues quickly.   Stairs            Wheelchair Mobility    Modified Rankin (Stroke Patients Only)       Balance Overall balance assessment: Needs assistance Sitting-balance support: Feet supported;No  upper extremity supported Sitting balance-Leahy Scale: Good     Standing balance support: During functional activity Standing balance-Leahy Scale: Poor Standing balance comment: Relient on RW for support.                     Cognition Arousal/Alertness: Awake/alert Behavior During Therapy: WFL for tasks assessed/performed Overall Cognitive Status: Within Functional Limits for tasks assessed                      Exercises General Exercises - Lower Extremity Long Arc Quad: Both;10 reps;Seated Hip Flexion/Marching: Both;10 reps;Seated    General Comments        Pertinent Vitals/Pain Pain Assessment: No/denies pain    Home Living                      Prior Function            PT Goals (current goals can now be found in the care plan section) Progress towards PT goals: Progressing toward goals    Frequency  Min 3X/week    PT Plan Current plan remains appropriate    Co-evaluation             End of Session Equipment Utilized During Treatment: Gait belt Activity Tolerance: Patient tolerated treatment well;Patient limited by fatigue Patient left: in chair;with call bell/phone within reach;with family/visitor present     Time: 1152-1210 PT Time Calculation (min) (ACUTE ONLY): 18 min  Charges:  $Gait Training: 8-22 mins  G CodesCandy Sledge A 2014/10/27, 1:00 PM  Candy Sledge, [place], DPT [phone]

## 2014-10-23 NOTE — Progress Notes (Signed)
Occupational Therapy Treatment Patient Details Name: [name] [name] MRN: [id number] DOB: 08/24/1940 Today's Date: 10/23/2014    History of present illness Pt presented on 10/19/2014 with abdominal pain and diarrhea which had been persistent since 4/6 and was positive for C-dff.  PMH:  CKD, PVD, arthritis, HTN, depression, anxiety, fibromyalgia   OT comments  Pt improving with endurance/activity tolerance, but does fatigue quickly placing her at increased risk for falls.  Requires min - mod A for ADLs.  Recommend SNF  Follow Up Recommendations  SNF    Equipment Recommendations  None recommended by OT    Recommendations for Other Services      Precautions / Restrictions Precautions Precautions: Fall Restrictions Weight Bearing Restrictions: No       Mobility Bed Mobility               General bed mobility comments: Sitting in chair upon PT arrival.   Transfers Overall transfer level: Needs assistance Equipment used: Rolling walker (2 wheeled) Transfers: Sit to/from [facility] Sit to Stand: Min assist Stand pivot transfers: Min assist       General transfer comment: Min A for balance and safety     Balance Overall balance assessment: Needs assistance Sitting-balance support: Feet supported Sitting balance-Leahy Scale: Good     Standing balance support: Bilateral upper extremity supported Standing balance-Leahy Scale: Poor Standing balance comment: requires min A                    ADL                           Toilet Transfer: Minimal assistance;Ambulation;Comfort height toilet;BSC;RW   Toileting- Clothing Manipulation and Hygiene: Minimal assistance;Sit to/from stand       Functional mobility during ADLs: Minimal assistance;Rolling walker General ADL Comments: Pt fatigues quickly with activity.   As she fatigues, her legs buckle making her very high risk for falls.  Instructed her to begin to do her own sponge bathing  with CNA supervising and to perform bil. UE shoulder flexion 10x every hour       Vision                     Perception     Praxis      Cognition   Behavior During Therapy: [facility] for tasks assessed/performed Overall Cognitive Status: Within Functional Limits for tasks assessed                       Extremity/Trunk Assessment               Exercises General Exercises - Lower Extremity Long Arc Quad: Both;10 reps;Seated Hip Flexion/Marching: Both;10 reps;Seated Other Exercises Other Exercises: Pt performed 2 sets 10 reps shoulder flexion bil.  seated    Shoulder Instructions       General Comments      Pertinent Vitals/ Pain       Pain Assessment: Faces Faces Pain Scale: Hurts little more Pain Location: buttocks  Pain Descriptors / Indicators: Burning Pain Intervention(s): Repositioned  Home Living                                          Prior Functioning/Environment              Frequency Min 2X/week  Progress Toward Goals  OT Goals(current goals can now be found in the care plan section)  Progress towards OT goals: Progressing toward goals  ADL Goals Pt Will Perform Grooming: with min guard assist;with supervision;with set-up;standing Pt Will Perform Lower Body Bathing: with min assist;sit to/from stand Pt Will Perform Lower Body Dressing: with min assist;sit to/from stand Pt Will Transfer to Toilet: with min guard assist;with supervision;regular height toilet;ambulating;bedside commode;grab bars Pt Will Perform Toileting - Clothing Manipulation and hygiene: with min guard assist;with supervision;sit to/from stand  Plan Discharge plan remains appropriate    Co-evaluation                 End of Session Equipment Utilized During Treatment: Rolling walker   Activity Tolerance Patient limited by fatigue   Patient Left in chair;with call bell/phone within reach   Nurse Communication Mobility status         Time: 4696-2952 OT Time Calculation (min): 21 min  Charges: OT General Charges $OT Visit: 1 Procedure OT Treatments $Therapeutic Activity: 8-22 mins  [name] [name] M 10/23/2014, 3:38 PM

## 2014-10-24 DIAGNOSIS — R112 Nausea with vomiting, unspecified: Secondary | ICD-10-CM

## 2014-10-24 DIAGNOSIS — R52 Pain, unspecified: Secondary | ICD-10-CM | POA: Insufficient documentation

## 2014-10-24 MED ORDER — ZOLPIDEM TARTRATE 5 MG PO TABS
5.0000 mg | ORAL_TABLET | Freq: Every evening | ORAL | Status: DC | PRN
  Administered 2014-10-24: 5 mg via ORAL
  Filled 2014-10-24: qty 1

## 2014-10-24 NOTE — Progress Notes (Signed)
Family Medicine Teaching Service Daily Progress Note Intern Pager: [phone]  Patient name: [place] record number: [id number] Date of birth: 03-17-1941 Age: 74 y.o. Gender: female  Primary Care Provider: Kenn File, MD Consultants: None Code Status: DNI; full code blue otherwise.   Assessment and Plan: 74 y.o. female presenting with low abdominal pain, weakness, and dehydration found to have C. diff colitis. PMH is significant for OSA on CPAP, HTN, HLD, PVD.   C. difficile colitis: No leukocytosis. Related to recent abx for UTI.  - Flagyl 500mg  q8hr IV (4/12 >> 4/15) - Vancomycin 125mg  po QID (4/15 >>  ) - Replete volume and electrolytes as indicated; hold diuretic - Bentyl 10mg  po TID - Enteric precautions  Weakness/deconditioning: Acute component due to UTI and dehydration due to poor po in addition to general deconditioning. She will require physical therapy.  - PT/OT while inpatient: Will need short term SNF placement, prefers [facility]. [facility] and [facility] signed. CSW following.   HTN/PVD/HLD:  - Continue plavix.  - Hold chlorthalidone given dehydration and hypokalemia.  - Will hold fenofibrate as this may worsen diarrhea/nausea.  Neuropathy: Continue neurontin and tramadol Depression/anxiety: Stable. Has capacity. Continue zoloft and trazodone  FEN/GI: Clear liquids advancing trial to heart healthy; 1/2 NS @ 110cc/hr Prophylaxis: Subcutaneous heparin  Disposition: SNF Rehab  Subjective:  Improving nausea with no emesis. Abdominal cramping improved. Still frequent diarrhea though she is able to "hold it" more now. She still eats while sitting on commode. She requests trial of advanced diet. Walked with PT in hall yesterday.   Objective: Temp:  [98.7 F (37.1 C)-98.9 F (37.2 C)] 98.7 F (37.1 C) (04/16 0514) Pulse Rate:  [71-100] 100 (04/16 0514) Resp:  [16-19] 18 (04/16 0514) BP: (126-133)/(60-63) 126/60 mmHg (04/16 0514) SpO2:  [98 %] 98 % (04/16  0514) Physical Exam: General: Blanchie Serve 74yo female sitting on bedside commode eating breakfast in no distress Cardiovascular: Regular rate without murmurs. cap refill < 2 sec Respiratory: Clear to auscultation bilaterally. No wheezes. No increased work of breathing Abdomen: Hyperactive BS. Soft with mild tenderness to palpation, nondistended.  Extremities: Trace pedal edema  Laboratory:  Recent Labs Lab 10/20/14 0426 10/21/14 1230 10/22/14 1004  WBC 4.7 7.4 5.4  HGB 11.7* 12.2 11.6*  HCT 34.4* 35.9* 34.6*  PLT 146* 166 147*    Recent Labs Lab 10/19/14 1457  10/20/14 0426 10/21/14 1128 10/22/14 1004  NA 136  --  141 141 140  K 3.3*  --  3.2* 5.2* 4.5  CL 100  --  108 110 108  CO2 25  --  27 27 24   BUN 8  --  <5* <5* <5*  CREATININE 1.00  < > 1.00 0.87 0.88  CALCIUM 9.0  --  8.2* 9.4 9.0  PROT 7.2  --   --   --   --   BILITOT 0.7  --   --   --   --   ALKPHOS 69  --   --   --   --   ALT 12  --   --   --   --   AST 31  --   --   --   --   GLUCOSE 98  --  98 94 87  < > = values in this interval not displayed. Imaging/Diagnostic Tests: Ct Abdomen Pelvis Wo Contrast  10/16/2014   CLINICAL DATA:  Shortness of breath, weakness for 4 days, nausea starting yesterday, lower abdominal pain  EXAM:  CT ABDOMEN AND PELVIS WITHOUT CONTRAST  TECHNIQUE: Multidetector CT imaging of the abdomen and pelvis was performed following the standard protocol without IV contrast.  COMPARISON:  None.  FINDINGS: Sagittal images of the spine shows osteopenia. Degenerative changes thoracolumbar spine.  Unenhanced liver shows no biliary ductal dilatation. No calcified gallstones are noted within gallbladder. Atherosclerotic calcifications of splenic artery. Atherosclerotic calcifications of abdominal aorta and iliac arteries.  No aortic aneurysm. The pancreas spleen and adrenal glands are unremarkable.  There is atrophic left kidney with cortical thinning. There is a nonobstructive calculus in lower pole of the  left kidney measures 2.8 mm. Bilateral no hydronephrosis or hydroureter. No calcified ureteral calculi are noted bilaterally. Urinary bladder is unremarkable.  No small bowel obstruction.  No ascites or free air.  No adenopathy.  The patient is status post appendectomy. No pericecal inflammation. The terminal ileum is unremarkable. No distal colonic obstruction. The patient is status post hysterectomy.  No calcified calculi are noted within urinary bladder.  IMPRESSION: 1. There is asymmetric atrophic left kidney with cortical thinning. Nonobstructive calcified calculus in lower pole of the left kidney measures 2.8 mm. 2. No hydronephrosis or hydroureter bilaterally. No calcified ureteral calculi are noted. 3. Status post appendectomy.  No pericecal inflammation. 4. No small bowel obstruction. 5. Status post hysterectomy. No calcified calculi are noted within urinary bladder. 6. Osteopenia and degenerative changes thoracolumbar spine.   Electronically Signed   By: Lahoma Crocker M.D.   On: 10/16/2014 17:28   Dg Chest Port 1 View  10/16/2014   CLINICAL DATA:  Shortness of breath. Weakness for the past 2 days. Nausea.  EXAM: PORTABLE CHEST - 1 VIEW  COMPARISON:  03/23/2014  FINDINGS: Lower cervical plate and screw fixator. Dextroconvex upper thoracic scoliosis and levoconvex thoracolumbar scoliosis with spondylosis.  Mild scarring along the right hemidiaphragm. Mild chronic interstitial accentuation bilaterally. No pleural effusion identified. Central airway thickening.  IMPRESSION: 1. Airway thickening is present, suggesting bronchitis or reactive airways disease. 2. Chronic interstitial lung disease, fine reticular opacities bilaterally. 3. Chronic scarring at the right lung base. 4. Thoracolumbar scoliosis.  Thoracic spondylosis.   Electronically Signed   By: Van Clines M.D.   On: 10/16/2014 14:35   Dg Abd Acute W/chest  10/19/2014   CLINICAL DATA:  Bronchiolitis in urinary tract infection treated on  04/08/ 16. Increased weakness.  EXAM: DG ABDOMEN ACUTE W/ 1V CHEST  COMPARISON:  CT abdomen 10/16/2014 all  FINDINGS: Normal cardiac silhouette. Lungs are clear. There is a reticular nodular density at the left lung base slightly increased from prior.  No dilated loops of large or small bowel. Gas the rectum. No pathologic calcifications. No organomegaly. Degenerate change the spine.  IMPRESSION: 1. Left lower lobe atelectasis versus infiltrate. 2. No bowel obstruction or intraperitoneal free air.   Electronically Signed   By: Suzy Bouchard M.D.   On: 10/19/2014 16:42   [name], MD 10/24/2014, 8:21 AM PGY-2, [place] Intern pager: [phone], text pages welcome

## 2014-10-24 NOTE — Discharge Summary (Signed)
[facility] Discharge Summary  Patient name: [name] [name] record number: [id number] Date of birth: 17-Oct-1940 Age: 74 y.o. Gender: female Date of Admission: 10/19/2014  Date of Discharge: 10/27/14 Admitting Physician: Blane Ohara McDiarmid, MD  Primary Care Provider: Kenn File, MD Consultants: None  Indication for Hospitalization: Dehydration Secondary to C. Difficile Colitis  Discharge Diagnoses/Problem List:  C. Difficile Colitis Dehydration Thrombocytopenia HTN PVD HLD Neuropathy Depression/Anxiety  Disposition: SNF   Discharge Condition: Stable  Discharge Exam: Please refer to Progress Note from 10/27/14.  Brief Hospital Course:  [name] [name] presented to the Emergency Department on 4/11 with lower abdominal pain, nausea, diarrhea, and weakness. She was recently diagnosed with a UTI on 4/8 and prescribed Augmentin. Admitted to the [facility] Medicine Teaching Service due to significant weakness and dehydration.  Ceftriaxone was initiated to complete course of treatment for UTI and discontinued on 4/12. UA negative. D5 1/2NS with 43mEq KCl initiated due to dehydration and hypokalemia (potassium 3.3). C. difficile by PCR positive. Oral Flagyl initiated on 4/12. On 4/13, [name] [name] developed vomiting and was transitioned to IV Flagyl. Noted to be hyperkalemic with potassium of 5.2, so fluids transitioned to 1/2 NS. Bentyl initiated on 4/14 due to increased cramping. No improvement in diarrhea noted, so oral Vancomycin initiated on 4/15 and Flagyl discontinued. Noted significant improvement in diarrhea with Vancomycin. IV Fluids discontinued 4/18. PT and OT evaluated and treated throughout hospitalization and recommended SNF with rehab at discharge.  [name] [name] was discharged on 10/27/14 following improvement in diarrhea and improved but not resolved weakness.   Issues for Follow Up:  1. Note continued improvement in diarrhea. Discharged on day #8/14 of  antibiotics. 2. Follow up CBC. Thrombocytopenia noted during hospitalization. Improved with WBC 5.2, Hemoglobin 10.6, and Platelets 148 at discharge. 3. Follow up weakness. Continue PT at rehab.  Significant Procedures: None  Significant Labs and Imaging:   Recent Labs Lab 10/20/14 0426 10/21/14 1230 10/22/14 1004  WBC 4.7 7.4 5.4  HGB 11.7* 12.2 11.6*  HCT 34.4* 35.9* 34.6*  PLT 146* 166 147*    Recent Labs Lab 10/19/14 1457 10/19/14 2103 10/20/14 0426 10/21/14 1128 10/22/14 1004  NA 136  --  141 141 140  K 3.3*  --  3.2* 5.2* 4.5  CL 100  --  108 110 108  CO2 25  --  27 27 24   GLUCOSE 98  --  98 94 87  BUN 8  --  <5* <5* <5*  CREATININE 1.00 0.98 1.00 0.87 0.88  CALCIUM 9.0  --  8.2* 9.4 9.0  MG  --   --  1.4*  --   --   PHOS  --   --  2.2*  --   --   ALKPHOS 69  --   --   --   --   AST 31  --   --   --   --   ALT 12  --   --   --   --   ALBUMIN 4.2  --   --   --   --    Urinalysis    Component Value Date/Time   COLORURINE YELLOW 10/19/2014 1619   APPEARANCEUR CLEAR 10/19/2014 1619   LABSPEC 1.009 10/19/2014 1619   PHURINE 7.5 10/19/2014 1619   GLUCOSEU NEGATIVE 10/19/2014 1619   HGBUR NEGATIVE 10/19/2014 1619   BILIRUBINUR NEGATIVE 10/19/2014 1619   BILIRUBINUR NEG 09/11/2013 1425   KETONESUR NEGATIVE 10/19/2014 1619   PROTEINUR  NEGATIVE 10/19/2014 1619   PROTEINUR NEG 09/11/2013 1425   UROBILINOGEN 0.2 10/19/2014 1619   UROBILINOGEN 0.2 09/11/2013 1425   NITRITE NEGATIVE 10/19/2014 1619   NITRITE NEG 09/11/2013 1425   LEUKOCYTESUR NEGATIVE 10/19/2014 1619  - Lactic Acid 0.87 - C. Difficile positive  Ct Abdomen Pelvis Wo Contrast  10/16/2014   CLINICAL DATA:  Shortness of breath, weakness for 4 days, nausea starting yesterday, lower abdominal pain  EXAM: CT ABDOMEN AND PELVIS WITHOUT CONTRAST  TECHNIQUE: Multidetector CT imaging of the abdomen and pelvis was performed following the standard protocol without IV contrast.  COMPARISON:  None.  FINDINGS:  Sagittal images of the spine shows osteopenia. Degenerative changes thoracolumbar spine.  Unenhanced liver shows no biliary ductal dilatation. No calcified gallstones are noted within gallbladder. Atherosclerotic calcifications of splenic artery. Atherosclerotic calcifications of abdominal aorta and iliac arteries.  No aortic aneurysm. The pancreas spleen and adrenal glands are unremarkable.  There is atrophic left kidney with cortical thinning. There is a nonobstructive calculus in lower pole of the left kidney measures 2.8 mm. Bilateral no hydronephrosis or hydroureter. No calcified ureteral calculi are noted bilaterally. Urinary bladder is unremarkable.  No small bowel obstruction.  No ascites or free air.  No adenopathy.  The patient is status post appendectomy. No pericecal inflammation. The terminal ileum is unremarkable. No distal colonic obstruction. The patient is status post hysterectomy.  No calcified calculi are noted within urinary bladder.  IMPRESSION: 1. There is asymmetric atrophic left kidney with cortical thinning. Nonobstructive calcified calculus in lower pole of the left kidney measures 2.8 mm. 2. No hydronephrosis or hydroureter bilaterally. No calcified ureteral calculi are noted. 3. Status post appendectomy.  No pericecal inflammation. 4. No small bowel obstruction. 5. Status post hysterectomy. No calcified calculi are noted within urinary bladder. 6. Osteopenia and degenerative changes thoracolumbar spine.   Electronically Signed   By: Lahoma Crocker M.D.   On: 10/16/2014 17:28   Dg Chest Port 1 View  10/16/2014   CLINICAL DATA:  Shortness of breath. Weakness for the past 2 days. Nausea.  EXAM: PORTABLE CHEST - 1 VIEW  COMPARISON:  03/23/2014  FINDINGS: Lower cervical plate and screw fixator. Dextroconvex upper thoracic scoliosis and levoconvex thoracolumbar scoliosis with spondylosis.  Mild scarring along the right hemidiaphragm. Mild chronic interstitial accentuation bilaterally. No pleural  effusion identified. Central airway thickening.  IMPRESSION: 1. Airway thickening is present, suggesting bronchitis or reactive airways disease. 2. Chronic interstitial lung disease, fine reticular opacities bilaterally. 3. Chronic scarring at the right lung base. 4. Thoracolumbar scoliosis.  Thoracic spondylosis.   Electronically Signed   By: Van Clines M.D.   On: 10/16/2014 14:35   Dg Abd Acute W/chest  10/19/2014   CLINICAL DATA:  Bronchiolitis in urinary tract infection treated on 04/08/ 16. Increased weakness.  EXAM: DG ABDOMEN ACUTE W/ 1V CHEST  COMPARISON:  CT abdomen 10/16/2014 all  FINDINGS: Normal cardiac silhouette. Lungs are clear. There is a reticular nodular density at the left lung base slightly increased from prior.  No dilated loops of large or small bowel. Gas the rectum. No pathologic calcifications. No organomegaly. Degenerate change the spine.  IMPRESSION: 1. Left lower lobe atelectasis versus infiltrate. 2. No bowel obstruction or intraperitoneal free air.   Electronically Signed   By: Suzy Bouchard M.D.   On: 10/19/2014 16:42  Results/Tests Pending at Time of Discharge: None  Discharge Medications:    Medication List    ASK your doctor about these medications  amoxicillin-clavulanate 875-125 MG per tablet  Commonly known as:  AUGMENTIN  Take 1 tablet by mouth 2 (two) times daily. One po bid x 7 days     azithromycin 250 MG tablet  Commonly known as:  ZITHROMAX  Take 1 tablet (250 mg total) by mouth daily. Take first 2 tablets together, then 1 every day until finished.     BC HEADACHE POWDER PO  Take 1 packet by mouth every 6 (six) hours as needed (pain).     chlorthalidone 25 MG tablet  Commonly known as:  HYGROTON  TAKE ONE TABLET BY MOUTH ONCE DAILY     clopidogrel 75 MG tablet  Commonly known as:  PLAVIX  Take 1 tablet (75 mg total) by mouth every morning.     feeding supplement (ENSURE COMPLETE) Liqd  Take 237 mLs by mouth 2 (two) times  daily between meals.     fenofibrate 160 MG tablet  Take 1 tablet (160 mg total) by mouth daily.     fluticasone 50 MCG/ACT nasal spray  Commonly known as:  FLONASE  Place 1 spray into both nostrils daily as needed for allergies or rhinitis (congestion).     gabapentin 100 MG capsule  Commonly known as:  NEURONTIN  Take 2 capsules (200 mg total) by mouth 3 (three) times daily.     multivitamin with minerals Tabs tablet  Take 1 tablet by mouth daily.     ondansetron 4 MG disintegrating tablet  Commonly known as:  ZOFRAN ODT  Take 1 tablet (4 mg total) by mouth every 4 (four) hours as needed for nausea or vomiting.     pantoprazole 20 MG tablet  Commonly known as:  PROTONIX  Take 1 tablet (20 mg total) by mouth daily.     polyethylene glycol packet  Commonly known as:  MIRALAX / GLYCOLAX  Take 17 g by mouth daily.     sertraline 100 MG tablet  Commonly known as:  ZOLOFT  Take 1 tablet (100 mg total) by mouth 2 (two) times daily.     sodium chloride 5 % ophthalmic ointment  Commonly known as:  MURO 628  Place 1 application into both eyes 3 (three) times daily.     SYSTANE OP  Place 1 drop into both eyes 2 (two) times daily.     traMADol 50 MG tablet  Commonly known as:  ULTRAM  Take 1 tablet (50 mg total) by mouth every 6 (six) hours as needed for moderate pain.     traZODone 100 MG tablet  Commonly known as:  DESYREL  Take 0.5 tablets (50 mg total) by mouth at bedtime.     Vitamin D 2000 UNITS tablet  Take 2,000 Units by mouth daily.        Discharge Instructions: Please refer to Patient Instructions section of EMR for full details.  Patient was counseled important signs and symptoms that should prompt return to medical care, changes in medications, dietary instructions, activity restrictions, and follow up appointments.   Follow-Up Appointments:   Lorna Few, DO 10/24/2014, 11:48 PM PGY-1, [place]

## 2014-10-25 LAB — BASIC METABOLIC PANEL
Anion gap: 9 (ref 5–15)
BUN: <5 mg/dL — ABNORMAL LOW (ref 6–23)
CO2: 24 mmol/L (ref 19–32)
Calcium: 8.6 mg/dL (ref 8.4–10.5)
Chloride: 107 mmol/L (ref 96–112)
Creatinine, Ser: 0.98 mg/dL (ref 0.50–1.10)
GFR calc Af Amer: 64 mL/min — ABNORMAL LOW (ref >90)
GFR calc non Af Amer: 55 mL/min — ABNORMAL LOW (ref >90)
Glucose, Bld: 101 mg/dL — ABNORMAL HIGH (ref 70–99)
Potassium: 4.1 mmol/L (ref 3.5–5.1)
Sodium: 140 mmol/L (ref 135–145)

## 2014-10-25 LAB — CBC
HCT: 31.7 % — ABNORMAL LOW (ref 36.0–46.0)
Hemoglobin: 10.9 g/dL — ABNORMAL LOW (ref 12.0–15.0)
MCH: 34.1 pg — ABNORMAL HIGH (ref 26.0–34.0)
MCHC: 34.4 g/dL (ref 30.0–36.0)
MCV: 99.1 fL (ref 78.0–100.0)
Platelets: 135 10*3/uL — ABNORMAL LOW (ref 150–400)
RBC: 3.2 MIL/uL — ABNORMAL LOW (ref 3.87–5.11)
RDW: 14.5 % (ref 11.5–15.5)
WBC: 5.1 10*3/uL (ref 4.0–10.5)

## 2014-10-25 LAB — MAGNESIUM: Magnesium: 1.3 mg/dL — ABNORMAL LOW (ref 1.5–2.5)

## 2014-10-25 LAB — PHOSPHORUS: Phosphorus: 3 mg/dL (ref 2.3–4.6)

## 2014-10-25 MED ORDER — OXYCODONE HCL 5 MG PO TABS
2.5000 mg | ORAL_TABLET | Freq: Four times a day (QID) | ORAL | Status: DC | PRN
  Administered 2014-10-25 – 2014-10-26 (×4): 2.5 mg via ORAL
  Filled 2014-10-25 (×4): qty 1

## 2014-10-25 MED ORDER — MAGNESIUM SULFATE 2 GM/50ML IV SOLN
2.0000 g | Freq: Once | INTRAVENOUS | Status: AC
  Administered 2014-10-25: 2 g via INTRAVENOUS
  Filled 2014-10-25: qty 50

## 2014-10-25 NOTE — Progress Notes (Signed)
Family Medicine Teaching Service Daily Progress Note Intern Pager: [phone]  Patient name: [place] record number: [id number] Date of birth: 12-02-1940 Age: 74 y.o. Gender: female  Primary Care Provider: Kenn File, MD Consultants: None Code Status: DNI; full code blue otherwise.   Assessment and Plan: 74 y.o. female presenting with low abdominal pain, weakness, and dehydration found to have C. diff colitis. PMH is significant for OSA on CPAP, HTN, HLD, PVD.   C. difficile colitis: Improving. No leukocytosis. Related to recent abx for UTI. Remainder of GI pathogen panel is negative.  - Flagyl 500mg  q8hr IV (4/12 >> 4/15 ?unsure why stopped) - Vancomycin 125mg  po QID (4/15 >>  ) - Replete volume and electrolytes as indicated - supplement magnesium today, likely will be able to D/C IVF soon and restart chlorthalidone.  - Bentyl 10mg  po TID - Enteric precautions - Nutrition consult  Thrombocytopenia: New, mild (135) - Continue monitoring. Continue heparin for now.   Weakness/deconditioning: Acute component due to UTI and dehydration due to poor po in addition to general deconditioning. She will require physical therapy.  - PT/OT while inpatient: Will need short term SNF placement, prefers [facility]. [facility] and [facility] signed. CSW following.   HTN/PVD/HLD:  - Continue plavix.  - Hold chlorthalidone given dehydration. - Will hold fenofibrate as this may worsen diarrhea/nausea.  Neuropathy: Continue neurontin and tramadol Depression/anxiety: Stable. Has capacity. Continue zoloft and trazodone  FEN/GI: Heart healthy; 1/2 NS @ 110cc/hr Prophylaxis: Subcutaneous heparin  Disposition: SNF Rehab: ?unsure criteria for discharge to SNF, if any, in relation to diarrhea frequency  Subjective:  Improving overall: less cramping, stool this AM more well formed. Ate all of breakfast. Still has headache, tramadol isn't helping much.   Objective: Temp:  [98.6 F (37 C)-98.7 F  (37.1 C)] 98.6 F (37 C) (04/17 0532) Pulse Rate:  [78-98] 78 (04/17 0532) Resp:  [16-18] 16 (04/17 0532) BP: (121-144)/(56-75) 130/56 mmHg (04/17 0532) SpO2:  [97 %-99 %] 97 % (04/17 0532) Physical Exam: General: 74yo female sitting on side of bed in no distress Cardiovascular: Regular rate without murmurs. cap refill < 2 sec Respiratory: Clear to auscultation bilaterally. No wheezes. No increased work of breathing Abdomen: Normoactive BS. Soft with mild tenderness to palpation, nondistended.  Extremities: Trace pedal edema  Laboratory:  Recent Labs Lab 10/21/14 1230 10/22/14 1004 10/25/14 0418  WBC 7.4 5.4 5.1  HGB 12.2 11.6* 10.9*  HCT 35.9* 34.6* 31.7*  PLT 166 147* 135*    Recent Labs Lab 10/19/14 1457  10/21/14 1128 10/22/14 1004 10/25/14 0418  NA 136  < > 141 140 140  K 3.3*  < > 5.2* 4.5 4.1  CL 100  < > 110 108 107  CO2 25  < > 27 24 24   BUN 8  < > <5* <5* <5*  CREATININE 1.00  < > 0.87 0.88 0.98  CALCIUM 9.0  < > 9.4 9.0 8.6  PROT 7.2  --   --   --   --   BILITOT 0.7  --   --   --   --   ALKPHOS 69  --   --   --   --   ALT 12  --   --   --   --   AST 31  --   --   --   --   GLUCOSE 98  < > 94 87 101*  < > = values in this interval not displayed.  CDiff  PCR: positive  [name], MD 10/25/2014, 9:00 AM PGY-2, [place] Intern pager: [phone], text pages welcome

## 2014-10-26 LAB — BASIC METABOLIC PANEL
Anion gap: 9 (ref 5–15)
BUN: 5 mg/dL — ABNORMAL LOW (ref 6–23)
CO2: 23 mmol/L (ref 19–32)
Calcium: 8.5 mg/dL (ref 8.4–10.5)
Chloride: 108 mmol/L (ref 96–112)
Creatinine, Ser: 0.98 mg/dL (ref 0.50–1.10)
GFR calc Af Amer: 64 mL/min — ABNORMAL LOW (ref >90)
GFR calc non Af Amer: 55 mL/min — ABNORMAL LOW (ref >90)
Glucose, Bld: 96 mg/dL (ref 70–99)
Potassium: 4.2 mmol/L (ref 3.5–5.1)
Sodium: 140 mmol/L (ref 135–145)

## 2014-10-26 LAB — CBC
HCT: 31.8 % — ABNORMAL LOW (ref 36.0–46.0)
Hemoglobin: 10.6 g/dL — ABNORMAL LOW (ref 12.0–15.0)
MCH: 33.1 pg (ref 26.0–34.0)
MCHC: 33.3 g/dL (ref 30.0–36.0)
MCV: 99.4 fL (ref 78.0–100.0)
Platelets: 148 10*3/uL — ABNORMAL LOW (ref 150–400)
RBC: 3.2 MIL/uL — ABNORMAL LOW (ref 3.87–5.11)
RDW: 14.8 % (ref 11.5–15.5)
WBC: 5.2 10*3/uL (ref 4.0–10.5)

## 2014-10-26 LAB — MAGNESIUM: Magnesium: 1.5 mg/dL (ref 1.5–2.5)

## 2014-10-26 MED ORDER — VANCOMYCIN 50 MG/ML ORAL SOLUTION: 125.0000 mg | Freq: Four times a day (QID) | ORAL | Status: AC

## 2014-10-26 MED ORDER — ENSURE ENLIVE PO LIQD
237.0000 mL | ORAL | Status: DC
  Administered 2014-10-27: 237 mL via ORAL

## 2014-10-26 NOTE — Progress Notes (Signed)
Family Medicine Teaching Service Daily Progress Note Intern Pager: [phone]  Patient name: [name] [name] record number: [id number] Date of birth: [birth year] Age: 74 y.o. Gender: female  Primary Care Provider: Kenn File, MD Consultants: None Code Status: DNI; full code blue otherwise.   Assessment and Plan: 74 y.o. female presenting with low abdominal pain, weakness, and dehydration found to have C. diff colitis. PMH is significant for OSA on CPAP, HTN, HLD, PVD.   C. difficile colitis: Improving. No leukocytosis. Related to recent abx for UTI. Remainder of GI pathogen panel is negative.  - Flagyl 500mg  q8hr IV (4/12 >> 4/15) - Vancomycin 125mg  po QID (4/15 >>  )  - Total Antibiotic Day # 7/14. Stop Date 4/25 - Discontinue IV Fluids - Bentyl 10mg  po TID - Enteric precautions - Nutrition consult  Thrombocytopenia: New, mild  - Platelets 135 >148 - Continue monitoring. Continue heparin for now.   Weakness/deconditioning: Acute component due to UTI and dehydration due to poor po in addition to general deconditioning. She will require physical therapy.  - PT/OT while inpatient: Will need short term SNF placement, prefers [facility]. [facility] signed. CSW following.   HTN/PVD/HLD:  - Continue plavix.  - Hold chlorthalidone given dehydration - Will hold fenofibrate as this may worsen diarrhea/nausea.  Neuropathy: Continue neurontin and tramadol Depression/anxiety: Stable. Has capacity. Continue zoloft and trazodone  FEN/GI: Heart healthy; 1/2 NS @ 110cc/hr Prophylaxis: Subcutaneous heparin  Disposition: SNF Rehab  Subjective:  Feels much better today. Notes one bowel movement today that was soft. States she has control of her bowel movements. Abdominal pain improved. Continues to note occasional headache, improved with oxycodone. Continues to agree to rehab, stating that while she is getting stronger she is still very weak.  Objective: Temp:  [98.6 F (37  C)-99.2 F (37.3 C)] 98.6 F (37 C) (04/18 0513) Pulse Rate:  [80-103] 94 (04/18 0513) Resp:  [16-18] 16 (04/18 0513) BP: (125-129)/(54-66) 129/61 mmHg (04/18 0513) SpO2:  [95 %-98 %] 95 % (04/18 0513) Physical Exam: General: 74yo female sitting in bed eating breakfast in no apparent distress Cardiovascular: Regular rate and rhythm without murmurs. cap refill < 2 sec Respiratory: Clear to auscultation bilaterally. No wheezes. No increased work of breathing Abdomen: Normoactive BS. Soft with mild tenderness to palpation, nondistended.  Extremities: Trace pedal edema  Laboratory:  Recent Labs Lab 10/22/14 1004 10/25/14 0418 10/26/14 0335  WBC 5.4 5.1 5.2  HGB 11.6* 10.9* 10.6*  HCT 34.6* 31.7* 31.8*  PLT 147* 135* 148*    Recent Labs Lab 10/19/14 1457  10/22/14 1004 10/25/14 0418 10/26/14 0335  NA 136  < > 140 140 140  K 3.3*  < > 4.5 4.1 4.2  CL 100  < > 108 107 108  CO2 25  < > 24 24 23   BUN 8  < > <5* <5* 5*  CREATININE 1.00  < > 0.88 0.98 0.98  CALCIUM 9.0  < > 9.0 8.6 8.5  PROT 7.2  --   --   --   --   BILITOT 0.7  --   --   --   --   ALKPHOS 69  --   --   --   --   ALT 12  --   --   --   --   AST 31  --   --   --   --   GLUCOSE 98  < > 87 101* 96  < > =  values in this interval not displayed.  CDiff PCR: positive  Lorna Few, DO 10/26/2014, 6:56 AM PGY-1, [place] Intern pager: [phone], text pages welcome

## 2014-10-26 NOTE — Progress Notes (Signed)
Physical Therapy Treatment Patient Details Name: [name] [name] MRN: [id number] DOB: 1940-11-11 Today's Date: 10/26/2014    History of Present Illness Pt presented on 10/19/2014 with abdominal pain and diarrhea which had been persistent since 4/6 and was positive for C-dff.  PMH:  CKD, PVD, arthritis, HTN, depression, anxiety, fibromyalgia    PT Comments    Patient progressing well with mobility. Improved ambulation distance today however continues to exhibit fatigue, dyspnea and bil knee instability. Highly motivated to return to functional independence. Pt continues to be high fall risk due to balance deficits. Will continue to follow and progress as tolerated. Encourage mobility with RN during the day to improve strength/mobility.  Follow Up Recommendations  SNF     Equipment Recommendations  None recommended by PT    Recommendations for Other Services       Precautions / Restrictions Precautions Precautions: Fall Restrictions Weight Bearing Restrictions: No    Mobility  Bed Mobility               General bed mobility comments: Sitting in chair upon PT arrival.   Transfers Overall transfer level: Needs assistance Equipment used: Rolling walker (2 wheeled) Transfers: Sit to/from Stand Sit to Stand: Min guard         General transfer comment: Min guard for safety due to unsteadiness.  Ambulation/Gait Ambulation/Gait assistance: Min assist Ambulation Distance (Feet): 150 Feet Assistive device: Rolling walker (2 wheeled) Gait Pattern/deviations: Step-through pattern;Decreased stride length;Trunk flexed Gait velocity: Slow   General Gait Details: Pt with slow and unsteady gait. Min A for balance/safety. Cues for RW managment. Dyspnea present. Bil knee instability noted but no LOB.   Stairs            Wheelchair Mobility    Modified Rankin (Stroke Patients Only)       Balance Overall balance assessment: Needs assistance Sitting-balance support:  Feet supported;No upper extremity supported Sitting balance-Leahy Scale: Good     Standing balance support: During functional activity Standing balance-Leahy Scale: Poor                      Cognition Arousal/Alertness: Awake/alert Behavior During Therapy: WFL for tasks assessed/performed Overall Cognitive Status: Within Functional Limits for tasks assessed                      Exercises General Exercises - Lower Extremity Ankle Circles/Pumps: Both;15 reps;Seated Long Arc Quad: Both;20 reps;Seated Hip Flexion/Marching: Both;20 reps;Seated    General Comments        Pertinent Vitals/Pain Pain Assessment: No/denies pain    Home Living                      Prior Function            PT Goals (current goals can now be found in the care plan section) Progress towards PT goals: Progressing toward goals    Frequency  Min 3X/week    PT Plan Current plan remains appropriate    Co-evaluation             End of Session Equipment Utilized During Treatment: Gait belt Activity Tolerance: Patient tolerated treatment well;Patient limited by fatigue Patient left: in chair;with call bell/phone within reach     Time: 0945-1004 PT Time Calculation (min) (ACUTE ONLY): 19 min  Charges:  $Gait Training: 8-22 mins  G CodesCandy [name] A Nov 16, 2014, 11:17 AM  [name] [name], PT, DPT [phone]

## 2014-10-26 NOTE — Discharge Instructions (Signed)
Clostridium Difficile Infection °Clostridium difficile (C. difficile) is a bacteria found in the intestinal tract or colon. Under certain conditions, it causes diarrhea and sometimes severe disease. The severe form of the disease is known as pseudomembranous colitis (often called C. difficile colitis). This disease can damage the lining of the colon or cause the colon to become enlarged (toxic megacolon). °CAUSES °Your colon normally contains many different bacteria, including C. difficile. The balance of bacteria in your colon can change during illness. This is especially true when you take antibiotic medicine. Taking antibiotics may allow the C. difficile to grow, multiply excessively, and make a toxin that then causes illness. The elderly and people with certain medical conditions have a greater risk of getting C. difficile infections. °SYMPTOMS °· Watery diarrhea. °· Fever. °· Fatigue. °· Loss of appetite. °· Nausea. °· Abdominal swelling, pain, or tenderness. °· Dehydration. °DIAGNOSIS °Your symptoms may make your caregiver suspect a C. difficile infection, especially if you have used antibiotics in the preceding weeks. However, there are only 2 ways to know for certain whether you have a C. difficile infection: °· A lab test that finds the toxin in your stool. °· The specific appearance of an abnormality (pseudomembrane) in your colon. This can only be seen by doing a sigmoidoscopy or colonoscopy. These procedures involve passing an instrument through your rectum to look at the inside of your colon. °Your caregiver will help determine if these tests are necessary. °TREATMENT °· Most people are successfully treated with one of two specific antibiotics, usually given by mouth. Other antibiotics you are receiving are stopped if possible. °· Intravenous (IV) fluids and correction of electrolyte imbalance may be necessary. °· Rarely, surgery may be needed to remove the infected part of the intestines. °· Careful  hand washing by you and your caregivers is important to prevent the spread of infection. In the hospital, your caregivers may also put on gowns and gloves to prevent the spread of the C. difficile bacteria. Your room is also cleaned regularly with a solution containing bleach or a product that is known to kill C. difficile. °HOME CARE INSTRUCTIONS °· Drink enough fluids to keep your urine clear or pale yellow. Avoid milk, caffeine, and alcohol. °· Ask your caregiver for specific rehydration instructions. °· Try eating small, frequent meals rather than large meals. °· Take your antibiotics as directed. Finish them even if you start to feel better. °· Do not use medicines to slow diarrhea. This could delay healing or cause complications. °· Wash your hands thoroughly after using the bathroom and before preparing food. °· Make sure people who live with you wash their hands often, too. °· Carefully disinfect all surfaces with a product that contains chlorine bleach. °SEEK MEDICAL CARE IF: °· Diarrhea persists longer than expected or recurs after completing your course of antibiotic treatment for the C. difficile infection. °· You have trouble staying hydrated. °SEEK IMMEDIATE MEDICAL CARE IF: °· You develop a new fever. °· You have increasing abdominal pain or tenderness. °· There is blood in your stools, or your stools are dark black and tarry. °· You cannot hold down food or liquids. °MAKE SURE YOU: °· Understand these instructions. °· Will watch your condition. °· Will get help right away if you are not doing well or get worse. °Document Released: 04/05/2005 Document Revised: 11/10/2013 Document Reviewed: 12/02/2010 °ExitCare® Patient Information ©2015 [employer]. This information is not intended to replace advice given to you by your health care provider. Make sure you   discuss any questions you have with your health care provider. ° °

## 2014-10-26 NOTE — Progress Notes (Signed)
INITIAL NUTRITION ASSESSMENT  DOCUMENTATION CODES Per approved criteria  -Not Applicable   INTERVENTION: -Decrease Ensure Enlive po to daily, each supplement provides 350 kcal and 20 grams of protein  NUTRITION DIAGNOSIS: No nutrition dx at this time   Goal: Pt will meet >90% of estimated nutritional needs  Monitor:  PO/supplement intake, labs, weight changes, I/O's  Reason for Assessment: Consult to assess needs  74 y.o. female  Admitting Dx: Dehydration  [name] [name] is a 74 y.o. female presenting with low abdominal pain and weakness. PMH is significant for OSA on CPAP, HTN, HLD, PVD.   ASSESSMENT: Pt was diagnosed with C-Diff due to recent antibiotics for UTI, per medical resident note.  Spoke with RN, who reports disposition is for SNF. CSW is following. Rn reports pt is taking Ensure well, particularly the strawberry flavor.  Spoke with pt who reports her appetite is improving. She reveals "Today and yesterday I actually was close to eating all my food and wanted to eat". Estimated she consumed about 60-70% of yesterdays' dinner and today's breakfast. She admits to poor intake for the past week due to decreased appetite and abdominal pain. PTA, she consumes 1 strawberry Ensure daily and requests that she continue to receive once daily per home regimen. Meal intake 50-100%. She reveals UBW between 141-148#. She denies any recent wt loss. She reports hx of weight gain over the past several years, due to prednisone use.  Reinforced importance of good meal intake to promote healing process. Pt denied any further nutritional needs, but expressed appreciation for visit.  Nutrition-focused physical exam revealed no muscle or subcutaneous fat loss.  Labs reviewed. BUN <5.   Height: Ht Readings from Last 1 Encounters:  10/19/14 4\' 11"  (1.499 m)    Weight: Wt Readings from Last 1 Encounters:  10/23/14 147 lb 11.3 oz (67 kg)    Ideal Body Weight: 98#  % Ideal Body Weight:  150%  Wt Readings from Last 10 Encounters:  10/23/14 147 lb 11.3 oz (67 kg)  10/14/14 147 lb 4.8 oz (66.815 kg)  08/19/14 150 lb (68.04 kg)  08/17/14 157 lb 4.8 oz (71.351 kg)  08/16/14 156 lb 6 oz (70.931 kg)  08/10/14 149 lb (67.586 kg)  06/29/14 145 lb (65.772 kg)  05/08/14 151 lb (68.493 kg)  03/24/14 151 lb 0.2 oz (68.5 kg)  03/04/14 150 lb (68.04 kg)    Usual Body Weight: 150#  % Usual Body Weight: 98%^  BMI:  Body mass index is 29.82 kg/(m^2).  Estimated Nutritional Needs: Kcal: 1600-1800 Protein: 75-85 grams Fluid: 1.6-1.8 L  Skin: WDL  Diet Order: Diet Heart Room service appropriate?: Yes; Fluid consistency:: Thin  EDUCATION NEEDS: -Education needs addressed   Intake/Output Summary (Last 24 hours) at 10/26/14 1136 Last data filed at 10/26/14 0900  Gross per 24 hour  Intake   3330 ml  Output   3050 ml  Net    280 ml    Last BM: 10/26/14  Labs:   Recent Labs Lab 10/20/14 0426  10/22/14 1004 10/25/14 0418 10/26/14 0335  NA 141  < > 140 140 140  K 3.2*  < > 4.5 4.1 4.2  CL 108  < > 108 107 108  CO2 27  < > 24 24 23   BUN <5*  < > <5* <5* 5*  CREATININE 1.00  < > 0.88 0.98 0.98  CALCIUM 8.2*  < > 9.0 8.6 8.5  MG 1.4*  --   --  1.3* 1.5  PHOS 2.2*  --   --  3.0  --   GLUCOSE 98  < > 87 101* 96  < > = values in this interval not displayed.  CBG (last 3)  No results for input(s): GLUCAP in the last 72 hours.  Scheduled Meds: . bismuth subsalicylate  945 mg Oral TID AC & HS  . clopidogrel  75 mg Oral q morning - 10a  . dicyclomine  10 mg Oral TID AC  . feeding supplement (ENSURE ENLIVE)  237 mL Oral BID BM  . gabapentin  200 mg Oral TID  . heparin  5,000 Units Subcutaneous 3 times per day  . pantoprazole  20 mg Oral Daily  . sertraline  100 mg Oral BID  . sodium chloride  1 application Both Eyes TID  . traZODone  50 mg Oral QHS  . vancomycin  125 mg Oral 4 times per day    Continuous Infusions:   Past Medical History  Diagnosis Date   . Hypertension   . Heart murmur   . Pneumonia   . Anxiety   . Depression   . Psoriasis   . Arthritis   . Hypercholesteremia   . Congenital absence of one kidney     Pt has right kidney only  . Fibromyalgia   . Carpal tunnel syndrome, bilateral   . Chronic kidney disease     CKD stage III, absence of left kidney  . Peripheral vascular disease     Dr. Benjamine Sprague; right external iliac stent '06 with angioplasty '13  . Polymyalgia rheumatica     Past Surgical History  Procedure Laterality Date  . Abdominal hysterectomy      Partial   . Appendectomy    . Tonsillectomy    . Eye surgery    . Iliac artery stent    . Anterior cervical decomp/discectomy fusion  04/19/2012    Procedure: ANTERIOR CERVICAL DECOMPRESSION/DISCECTOMY FUSION 2 LEVELS;  Surgeon: Winfield Cunas, MD;  Location: [facility];  Service: Neurosurgery;  Laterality: N/A;  Cervical four-five,Cervical five-six anterior cervical decompression with fusion plating and bonegraft possible posterior cervical decompression  . Carpal tunnel release  1990    Toran Murch A. Jimmye Norman, RD, LDN, CDE Pager: [phone] After hours Pager: [phone]

## 2014-10-27 MED ORDER — SERTRALINE HCL 100 MG PO TABS: 100.0000 mg | ORAL_TABLET | Freq: Two times a day (BID) | ORAL | Status: DC

## 2014-10-27 MED ORDER — TRAMADOL HCL 50 MG PO TABS: 50.0000 mg | ORAL_TABLET | Freq: Four times a day (QID) | ORAL | Status: DC | PRN

## 2014-10-27 MED ORDER — TRAZODONE HCL 50 MG PO TABS: 50.0000 mg | ORAL_TABLET | Freq: Every day | ORAL | Status: DC

## 2014-10-27 NOTE — Progress Notes (Signed)
Patient dropped medications on the floor so I had to pull a second dose of the Plavix and Neurontin that were dropped from the pyxis.

## 2014-10-27 NOTE — Progress Notes (Signed)
Family Medicine Teaching Service Daily Progress Note Intern Pager: [phone]  Patient name: [name] [name] record number: [id number] Date of birth: 1941/07/02 Age: 74 y.o. Gender: female  Primary Care Provider: Kenn File, MD Consultants: None Code Status: DNI; full code blue otherwise.   Assessment and Plan: 74 y.o. female presenting with low abdominal pain, weakness, and dehydration found to have C. diff colitis. PMH is significant for OSA on CPAP, HTN, HLD, PVD.   # C. difficile colitis: Improving. No leukocytosis. Related to recent abx for UTI. Remainder of GI pathogen panel is negative.  - Flagyl 500mg  q8hr IV (4/12 >> 4/15) - Vancomycin 125mg  po QID (4/15 >>  )  - Total Antibiotic Day # 8/14. Stop Date 4/25 - Bentyl 10mg  po TID - Enteric precautions - Nutrition consult  # Weakness/deconditioning: Acute component due to UTI and dehydration due to poor po in addition to general deconditioning. She will require physical therapy.  - PT/OT while inpatient: Will need short term SNF placement, prefers [facility]. [facility] signed. CSW following.   # HTN/PVD/HLD:  - Continue plavix.  - Hold chlorthalidone given dehydration - Will hold fenofibrate as this may worsen diarrhea/nausea.  Neuropathy: Continue neurontin and tramadol Depression/anxiety: Stable. Has capacity. Continue zoloft and trazodone  FEN/GI: Heart healthy; 1/2 NS @ 110cc/hr Prophylaxis: Subcutaneous heparin  Disposition: SNF Rehab  Subjective:  Feeling well today. Diarrhea improved, with bowel movements now soft and less frequent. Continues to note some weakness. Agrees to Rehab.  Objective: Temp:  [98.3 F (36.8 C)-98.7 F (37.1 C)] 98.7 F (37.1 C) (04/19 0548) Pulse Rate:  [76-78] 76 (04/19 0548) Resp:  [16-17] 16 (04/19 0548) BP: (120-135)/(53-60) 120/53 mmHg (04/19 0548) SpO2:  [98 %-99 %] 99 % (04/19 0548) Physical Exam: General: 74yo female sitting in bed eating breakfast in no  apparent distress Cardiovascular: Regular rate and rhythm without murmurs. cap refill < 2 sec Respiratory: Clear to auscultation bilaterally. No wheezes. No increased work of breathing Abdomen: Normoactive BS. Soft with mild tenderness to palpation, nondistended.  Extremities: Trace pedal edema  Laboratory:  Recent Labs Lab 10/22/14 1004 10/25/14 0418 10/26/14 0335  WBC 5.4 5.1 5.2  HGB 11.6* 10.9* 10.6*  HCT 34.6* 31.7* 31.8*  PLT 147* 135* 148*    Recent Labs Lab 10/22/14 1004 10/25/14 0418 10/26/14 0335  NA 140 140 140  K 4.5 4.1 4.2  CL 108 107 108  CO2 24 24 23   BUN <5* <5* 5*  CREATININE 0.88 0.98 0.98  CALCIUM 9.0 8.6 8.5  GLUCOSE 87 101* 96    CDiff PCR: positive  Lorna Few, DO 10/27/2014, 9:04 AM PGY-1, [place] Intern pager: [phone], text pages welcome

## 2014-10-27 NOTE — Progress Notes (Signed)
Occupational Therapy Treatment Patient Details Name: [name] [name] MRN: [id number] DOB: 1941/06/05 Today's Date: 10/27/2014    History of present illness Pt presented on 10/19/2014 with abdominal pain and diarrhea which had been persistent since 4/6 and was positive for C-dff.  PMH:  CKD, PVD, arthritis, HTN, depression, anxiety, fibromyalgia   OT comments  Pt progressing toward OT goals at this time but remains with balance deficit with adls. Pt turning head to speak to OT at sink with LOB. Pt demonstrates some awareness to this LOB with attempting ankle strategy to correct.  Ot to continue to follow acutely   Follow Up Recommendations  SNF    Equipment Recommendations  None recommended by OT    Recommendations for Other Services      Precautions / Restrictions Precautions Precautions: Fall Restrictions Weight Bearing Restrictions: No       Mobility Bed Mobility               General bed mobility comments: in chair on arrival  Transfers Overall transfer level: Needs assistance Equipment used: Rolling walker (2 wheeled) Transfers: Sit to/from Stand Sit to Stand: Min guard         General transfer comment: Min guard for safety.     Balance Overall balance assessment: Needs assistance Sitting-balance support: Feet supported;No upper extremity supported Sitting balance-Leahy Scale: Good     Standing balance support: Single extremity supported;During functional activity Standing balance-Leahy Scale: Fair Standing balance comment: Reilent on RW for support.              High level balance activites: Sudden stops;Head turns High Level Balance Comments: Tolerated vertical and horizontal head turns with mild drifting and LOB erquiring external support to prevent fall.   ADL       Grooming: Wash/dry Radiographer, therapeutic: Supervision/safety;Ambulation;RW   Toileting- Clothing Manipulation and Hygiene:  Supervision/safety;Sit to/from stand       Functional mobility during ADLs: Min guard;Rolling walker General ADL Comments: pt with LOB and ankle strategies with head turn. Pt has incr risk to fall with head turns.       Vision                     Perception     Praxis      Cognition   Behavior During Therapy: WFL for tasks assessed/performed Overall Cognitive Status: Within Functional Limits for tasks assessed                       Extremity/Trunk Assessment               Exercises     Shoulder Instructions       General Comments      Pertinent Vitals/ Pain       Pain Assessment: No/denies pain Pain Score: 4  Pain Location: headache Pain Descriptors / Indicators: Headache Pain Intervention(s): Monitored during session  Home Living                                          Prior Functioning/Environment              Frequency Min 2X/week     Progress Toward Goals  OT Goals(current goals can now be found in the  care plan section)  Progress towards OT goals: Progressing toward goals  Acute Rehab OT Goals Patient Stated Goal: get better and go home OT Goal Formulation: With patient Time For Goal Achievement: 11/10/14 (goals remain appropriate) Potential to Achieve Goals: Good ADL Goals Pt Will Perform Grooming: standing;with modified independence Pt Will Perform Lower Body Bathing: sit to/from stand;with modified independence Pt Will Perform Lower Body Dressing: sit to/from stand;with modified independence Pt Will Transfer to Toilet: regular height toilet;ambulating;bedside commode;grab bars;with modified independence Pt Will Perform Toileting - Clothing Manipulation and hygiene: sit to/from stand;with modified independence  Plan Discharge plan remains appropriate    Co-evaluation                 End of Session Equipment Utilized During Treatment: Rolling walker   Activity Tolerance Patient tolerated  treatment well   Patient Left in chair;with call bell/phone within reach   Nurse Communication Mobility status;Precautions        Time: 1210-1228 OT Time Calculation (min): 18 min  Charges: OT General Charges $OT Visit: 1 Procedure OT Treatments $Self Care/Home Management : 8-22 mins  Parke Poisson B 10/27/2014, 1:08 PM  Pager: [phone]

## 2014-10-27 NOTE — Progress Notes (Signed)
Physical Therapy Treatment Patient Details Name: [name] [name] MRN: [id number] DOB: [birth year] Today's Date: 10/27/2014    History of Present Illness Pt presented on 10/19/2014 with abdominal pain and diarrhea which had been persistent since 4/6 and was positive for C-dff.  PMH:  CKD, PVD, arthritis, HTN, depression, anxiety, fibromyalgia    PT Comments    Patient progressing well with mobility. Pt continues to require Min A for balance during gait training esp when balance is challenged. Pt with LOB x2 with head movements and turns. Fatigues easily requiring short standing rest breaks. Tolerated stair negotiation with Min A for negotiation secondary to weakness. Continue to recommend St SNF to improve functional mobility.    Follow Up Recommendations  SNF     Equipment Recommendations  None recommended by PT    Recommendations for Other Services       Precautions / Restrictions Precautions Precautions: Fall Restrictions Weight Bearing Restrictions: No    Mobility  Bed Mobility               General bed mobility comments: Sitting in chair upon PT arrival.   Transfers Overall transfer level: Needs assistance Equipment used: Rolling walker (2 wheeled) Transfers: Sit to/from Stand Sit to Stand: Min guard         General transfer comment: Min guard for safety.   Ambulation/Gait Ambulation/Gait assistance: Min assist Ambulation Distance (Feet): 175 Feet Assistive device: Rolling walker (2 wheeled) Gait Pattern/deviations: Step-through pattern;Decreased stride length;Trunk flexed Gait velocity: .9 ft/sec Gait velocity interpretation: <1.8 ft/sec, indicative of risk for recurrent falls General Gait Details: Pt with slow and mildly unsteady. Performed head movements in all directions with LOB x2 requiring Min A to prevent fall. Dyspnea present. + dizziness with vertical head movements.    Stairs Stairs: Yes Stairs assistance: Min assist Stair Management:  Forwards;Step to pattern;One rail Right Number of Stairs: 4 General stair comments: Min A to ascend/descend steps. Heavy use of rail to negotiate steps with BUEs for support. Cues for technique.   Wheelchair Mobility    Modified Rankin (Stroke Patients Only)       Balance Overall balance assessment: Needs assistance Sitting-balance support: Feet supported;No upper extremity supported Sitting balance-Leahy Scale: Good     Standing balance support: During functional activity Standing balance-Leahy Scale: Poor Standing balance comment: Reilent on RW for support.              High level balance activites: Sudden stops;Head turns High Level Balance Comments: Tolerated vertical and horizontal head turns with mild drifting and LOB erquiring external support to prevent fall.    Cognition Arousal/Alertness: Awake/alert Behavior During Therapy: WFL for tasks assessed/performed Overall Cognitive Status: Within Functional Limits for tasks assessed                      Exercises      General Comments        Pertinent Vitals/Pain Pain Assessment: 0-10 Pain Score: 4  Pain Location: headache Pain Descriptors / Indicators: Headache Pain Intervention(s): Monitored during session;Premedicated before session;Repositioned;Limited activity within patient's tolerance    Home Living                      Prior Function            PT Goals (current goals can now be found in the care plan section) Progress towards PT goals: Progressing toward goals    Frequency  Min 3X/week    PT  Plan Current plan remains appropriate    Co-evaluation             End of Session Equipment Utilized During Treatment: Gait belt Activity Tolerance: Patient tolerated treatment well;Patient limited by fatigue Patient left: in chair;with call bell/phone within reach     Time: 0920-0940 PT Time Calculation (min) (ACUTE ONLY): 20 min  Charges:  $Gait Training: 8-22  mins                    G CodesCandy Sledge A 10/30/14, 10:16 AM  Candy Sledge, [place], DPT [phone]

## 2014-10-27 NOTE — Clinical Social Work Note (Signed)
Patient to be d/c'ed today to [facility].  Patient and family agreeable to plans will transport via ems RN to call report.  Patient appreciative of care provided and she states she is ready to leave so she can get better in order to return back home.  Evette Cristal, MSW, [place]

## 2014-10-27 NOTE — Care Management Note (Signed)
  Page 1 of 1   10/27/2014     8:20:18 AM CARE MANAGEMENT NOTE 10/27/2014  Patient:  [name] [name], [name] [name]   Account Number:  [id number]  Date Initiated:  10/27/2014  Documentation initiated by:  Magdalen Spatz  Subjective/Objective Assessment:     Action/Plan:   Anticipated DC Date:  10/27/2014   Anticipated DC Plan:  [place]  In-house referral  Clinical Social Worker         Choice offered to / List presented to:             Status of service:  Completed, signed off Medicare Important Message given?  YES (If response is "NO", the following Medicare IM given date fields will be blank) Date Medicare IM given:  10/27/2014 Medicare IM given by:  Magdalen Spatz Date Additional Medicare IM given:   Additional Medicare IM given by:    Discharge Disposition:  [place]  Per UR Regulation:  Reviewed for med. necessity/level of care/duration of stay  If discussed at [place] of Stay Meetings, dates discussed:   10/27/2014    Comments:

## 2014-10-27 NOTE — Clinical Social Work Placement (Signed)
Clinical Social Work Department CLINICAL SOCIAL WORK PLACEMENT NOTE 10/27/2014  Patient:  [name] [name], [name] [name]  Account Number:  [id number] Admit date:  10/19/2014  Clinical Social Worker:  Remingtyn Depaola, LCSWA  Date/time:  10/23/2014 05:25 PM  Clinical Social Work is seeking post-discharge placement for this patient at the following level of care:   SKILLED NURSING   (*CSW will update this form in Epic as items are completed)   10/23/2014  Patient/family provided with [place] Department of Clinical Social Work's list of facilities offering this level of care within the geographic area requested by the patient (or if unable, by the patient's family).  10/23/2014  Patient/family informed of their freedom to choose among providers that offer the needed level of care, that participate in Medicare, Medicaid or managed care program needed by the patient, have an available bed and are willing to accept the patient.  10/23/2014  Patient/family informed of [facility]' ownership interest in [facility], as well as of the fact that they are under no obligation to receive care at this facility.  PASARR submitted to EDS on 10/23/2014 PASARR number received on 10/23/2014  FL2 transmitted to all facilities in geographic area requested by pt/family on  10/23/2014 FL2 transmitted to all facilities within larger geographic area on 10/23/2014  Patient informed that his/her managed care company has contracts with or will negotiate with  certain facilities, including the following:     Patient/family informed of bed offers received:  10/23/2014 Patient chooses bed at [place] Physician recommends and patient chooses bed at    Patient to be transferred to [facility] on  10/27/2014 Patient to be transferred to facility by [facility] Patient and family notified of transfer on 10/27/2014 Name of family member notified:  [name]  The following  physician request were entered in Epic:   Additional Comments:  [name] [place], MSW, [place] 10/27/2014 5:29 PM

## 2014-10-27 NOTE — Clinical Social Work Psychosocial (Signed)
Clinical Social Work Department BRIEF PSYCHOSOCIAL ASSESSMENT 10/23/2014  Late Entry due to Epic not working when assessment was completed.  Patient:  [name] [name], [name] [name]     Account Number:  [id number]     Admit date:  10/19/2014  Clinical Social Worker:  Dian Queen  Date/Time:  10/23/2014 05:15 PM  Referred by:  Physician  Date Referred:  10/22/2014 Referred for  SNF Placement   Other Referral:   Interview type:  Patient Other interview type:    PSYCHOSOCIAL DATA Living Status:  WITH ADULT CHILDREN Admitted from facility:   Level of care:   Primary support name:  [name] Primary support relationship to patient:  CHILD, ADULT Degree of support available:   Patient lives with her daughter and has good support network, however, the daughter just had hip replacement, so she is not able to help take care of patient currently.    CURRENT CONCERNS Current Concerns  Post-Acute Placement   Other Concerns:    SOCIAL WORK ASSESSMENT / PLAN Patient is a 74 year old female who lives with her daughter.  Patient is alert and oriented x4 and pleasant to talk to.  Patient expressed that she has been to rehab before and would prefer to go to [facility].  Patient expressed concern that she would not be able to have a private room, [place] informed her that CSW can ask facilities if they have private available.  Due to patient's diagnosis she would have to have a private room so CSW informed her about this as well.  Patient stated that her daughter just had surgery in March and is not able to help take care of her which is the reason she is agreeing to go to SNF for short term rehab.  CSW reminded her of the process and she did not have any other questions.  CSW spoke to patient and she was talkative and looking forward to discharging from the hospital when she is medically ready and discharge orders have been received.   Assessment/plan status:  Psychosocial Support/Ongoing Assessment of  Needs Other assessment/ plan:   Information/referral to community resources:    PATIENT'S/FAMILY'S RESPONSE TO PLAN OF CARE: Patient and daughter Ileene Patrick to going to SNF for short term rehab.    [name] [address], MSW, [place] 10/27/2014 5:23 PM

## 2014-11-04 ENCOUNTER — Ambulatory Visit: Payer: PPO | Admitting: Cardiovascular Disease

## 2014-11-24 ENCOUNTER — Encounter: Payer: Self-pay | Admitting: Family Medicine

## 2014-11-24 ENCOUNTER — Ambulatory Visit (INDEPENDENT_AMBULATORY_CARE_PROVIDER_SITE_OTHER): Payer: PPO | Admitting: Family Medicine

## 2014-11-24 VITALS — BP 118/56 | HR 91 | Temp 97.7°F | Ht 59.0 in | Wt 151.3 lb

## 2014-11-24 DIAGNOSIS — L821 Other seborrheic keratosis: Secondary | ICD-10-CM | POA: Diagnosis not present

## 2014-11-24 DIAGNOSIS — A047 Enterocolitis due to Clostridium difficile: Secondary | ICD-10-CM | POA: Diagnosis not present

## 2014-11-24 DIAGNOSIS — A0472 Enterocolitis due to Clostridium difficile, not specified as recurrent: Secondary | ICD-10-CM

## 2014-11-24 DIAGNOSIS — L304 Erythema intertrigo: Secondary | ICD-10-CM | POA: Insufficient documentation

## 2014-11-24 DIAGNOSIS — G4733 Obstructive sleep apnea (adult) (pediatric): Secondary | ICD-10-CM

## 2014-11-24 MED ORDER — NYSTATIN 100000 UNIT/GM EX CREA: 1.0000 "application " | TOPICAL_CREAM | Freq: Two times a day (BID) | CUTANEOUS | Status: DC

## 2014-11-24 NOTE — Assessment & Plan Note (Signed)
She's frustrated with her mask and does not feel more rested after using her CPAP Refer to Sleep specialist (Cardiologiost Dr. Radford Pax) for further recommendations May also need ENT

## 2014-11-24 NOTE — Assessment & Plan Note (Signed)
Seborrheic keratosis of left axilla Non-irritated or inflamed Discussed natural course and red flags

## 2014-11-24 NOTE — Assessment & Plan Note (Signed)
Developed after course of Augmentin for UTI and bronchitis Treated in the hospital and  dentist short time and nursing home Now feeling better, stooling almost back to normal

## 2014-11-24 NOTE — Assessment & Plan Note (Signed)
Under R breast, has ben using zinc oxide cream DC zinc Use nystatin

## 2014-11-24 NOTE — Progress Notes (Signed)
Patient ID: [name] [name], female   DOB: [birth year], 74 y.o.   MRN: [id number]   HPI  Patient presents today for follow-up after hospitalization and short-term nursing home placement  Was hospitalized for weakness and developed Clostridium difficile after being treated for UTI outpatient previously. States that she's now feeling better and her stooling is almost back to normal.  We went through her medications in detail and she's taking them every day.  Rash Started about 5 days ago on her right breast described as itchy and irritated. She's been feeling zinc oxide cream on it she was using for her rectal area after severe diarrhea with C. Difficile No fever, chills, sweats, change in appetite  Nodule Has a dark raised lesion in her left axilla she states has turned darker brown lately. Is not irritated or painful, not itchy, not oozing or bleeding.   Obstructive sleep apnea Sleep study showed severe OSA, however the patient has not done well with her current machine and mask. She states that she feels less rested after a night using it. She's frustrated with it and would like to have help with the problem the cannot get good relief with her current set up.  Smoking status noted - former ROS: Per HPI  Objective: BP 118/56 mmHg  Pulse 91  Temp(Src) 97.7 F (36.5 C) (Oral)  Ht 4\' 11"  (1.499 m)  Wt 151 lb 4.8 oz (68.629 kg)  BMI 30.54 kg/m2 Gen: NAD, alert, cooperative with exam HEENT: NCAT CV: RRR, good S1/S2, no murmur Resp: CTABL, no wheezes, non-labored Ext: minimal puffy non pitting edema Neuro: Alert and oriented  Skin: 1- erythematous rash under her right breast approximately 4 cm long by 2 cm wide 2- approximately 1 cm x 3 mm raised stuck on greasy brown appearing lesion that's nontender to touch with sharp borders under left axilla  Assessment and plan:  Intertrigo Under R breast, has ben using zinc oxide cream DC zinc Use nystatin   Seborrheic  keratosis Seborrheic keratosis of left axilla Non-irritated or inflamed Discussed natural course and red flags   OSA (obstructive sleep apnea) She's frustrated with her mask and does not feel more rested after using her CPAP Refer to Sleep specialist (Cardiologiost Dr. Radford Pax) for further recommendations May also need ENT    Clostridium difficile colitis Developed after course of Augmentin for UTI and bronchitis Treated in the hospital and  dentist short time and nursing home Now feeling better, stooling almost back to normal     Orders Placed This Encounter  Procedures  . Ambulatory referral to Cardiology    Referral Priority:  Routine    Referral Type:  Consultation    Referral Reason:  Specialty Services Required    Requested Specialty:  Cardiology    Number of Visits Requested:  1    Meds ordered this encounter  Medications  . nystatin cream (MYCOSTATIN)    Sig: Apply 1 application topically 2 (two) times daily.    Dispense:  30 g    Refill:  0

## 2014-11-24 NOTE — Patient Instructions (Signed)
Try the nystatin cream for your rash  If your spot under your arm gets bothersome or changes let us know  You should hear from Dr. Theodosia Blender office for an appointment about sleep apnea  Intertrigo Intertrigo is a skin irritation (inflammation) that happens in warm, moist areas of the body. It happens mostly between folds of skin or where skin rubs together. HOME CARE  Keep the affected area cool and dry.  Leave the skin folds open to air.  Put cotton or linen between the folds of skin.  Avoid tight clothing.  Wear open-toed shoes or sandals.  Use powder on the affected area as told by your doctor.  Only use medicated creams or pastes as told by your doctor. GET HELP RIGHT AWAY IF:  The rash does not get better after 1 week of treatment.  The rash gets worse.  You have a fever or chills. MAKE SURE YOU:  Understand these instructions.  Will watch your condition.  Will get help right away if you are not doing well or get worse. Document Released: 07/29/2010 Document Revised: 09/18/2011 Document Reviewed: 07/29/2010 [facility] Patient Information 2015 [place], [place]. This information is not intended to replace advice given to you by your health care provider. Make sure you discuss any questions you have with your health care provider.

## 2014-12-02 ENCOUNTER — Ambulatory Visit (INDEPENDENT_AMBULATORY_CARE_PROVIDER_SITE_OTHER)
Admission: RE | Admit: 2014-12-02 | Discharge: 2014-12-02 | Disposition: A | Discharge status: Home / Self Care | Payer: PPO | Source: Ambulatory Visit | Attending: Pulmonary Disease | Admitting: Pulmonary Disease

## 2014-12-02 ENCOUNTER — Encounter: Payer: Self-pay | Admitting: Pulmonary Disease

## 2014-12-02 ENCOUNTER — Ambulatory Visit (INDEPENDENT_AMBULATORY_CARE_PROVIDER_SITE_OTHER): Payer: PPO | Admitting: Pulmonary Disease

## 2014-12-02 VITALS — BP 122/66 | HR 87 | Temp 98.1°F | Ht 60.0 in | Wt 154.6 lb

## 2014-12-02 DIAGNOSIS — R0609 Other forms of dyspnea: Secondary | ICD-10-CM

## 2014-12-02 DIAGNOSIS — R0600 Dyspnea, unspecified: Secondary | ICD-10-CM

## 2014-12-02 NOTE — Progress Notes (Signed)
   Subjective:    Patient ID: [name] [name], female    DOB: 1941/05/14, 74 y.o.   MRN: [id number]  HPI The patient comes in today at the request of her primary physician for evaluation of dyspnea on exertion. She was last seen in 2013 where she had fairly normal PFTs and nothing on chest x-ray to explain her symptoms. She comes in today where she feels that her dyspnea on exertion has worsened, but she has gained weight and clearly become more disabled from a musculoskeletal standpoint since that time. She denies any chronic cough, and rarely has chest congestion or mucus. She has now quit smoking, and I have congratulated her on this.   Review of Systems  Constitutional: Negative for fever and unexpected weight change.  HENT: Negative for congestion, dental problem, ear pain, nosebleeds, postnasal drip, rhinorrhea, sinus pressure, sneezing, sore throat and trouble swallowing.   Eyes: Negative for redness and itching.  Respiratory: Positive for cough and shortness of breath. Negative for chest tightness and wheezing.   Cardiovascular: Negative for palpitations and leg swelling.  Gastrointestinal: Negative for nausea and vomiting.  Genitourinary: Negative for dysuria.  Musculoskeletal: Negative for joint swelling.  Skin: Negative for rash.  Neurological: Negative for headaches.  Hematological: Does not bruise/bleed easily.  Psychiatric/Behavioral: Negative for dysphoric mood. The patient is not nervous/anxious.        Objective:   Physical Exam Obese female in no acute distress, and very debilitated with walking Nose without purulence or discharge noted Neck without lymphadenopathy or thyromegaly Chest with clear breath sounds and excellent air flow, no wheezing Cardiac exam with regular rate and rhythm Lower extremities with 1+ edema bilaterally, no cyanosis Alert and oriented, moves all 4 extremities.       Assessment & Plan:

## 2014-12-02 NOTE — Patient Instructions (Signed)
Your breathing tests are normal today.  Great news. Will check a chest xray and call you with results. Work on weight loss followup as needed.

## 2014-12-02 NOTE — Assessment & Plan Note (Signed)
The patient has worsening dyspnea on exertion that I suspect is related to her weight and debility. She had spirometry today, and it is totally normal. Will send her down for a chest x-ray, and if it looks okay, she will need no further pulmonary follow-up.

## 2014-12-02 NOTE — Addendum Note (Signed)
Addended by: Inge Rise on: 12/02/2014 02:36 PM   Modules accepted: Orders

## 2014-12-03 ENCOUNTER — Telehealth: Payer: Self-pay | Admitting: Pulmonary Disease

## 2014-12-03 NOTE — Telephone Encounter (Signed)
Please let pt know that her cxr looks good --  I spoke with patient about results and she verbalized understanding and had no questions

## 2014-12-04 ENCOUNTER — Encounter: Payer: Self-pay | Admitting: Family Medicine

## 2014-12-04 ENCOUNTER — Ambulatory Visit (INDEPENDENT_AMBULATORY_CARE_PROVIDER_SITE_OTHER): Payer: PPO | Admitting: Family Medicine

## 2014-12-04 VITALS — BP 113/73 | HR 88 | Temp 98.6°F | Ht 60.0 in | Wt 153.0 lb

## 2014-12-04 DIAGNOSIS — K658 Other peritonitis: Secondary | ICD-10-CM | POA: Diagnosis not present

## 2014-12-04 DIAGNOSIS — K654 Sclerosing mesenteritis: Secondary | ICD-10-CM

## 2014-12-08 DIAGNOSIS — K654 Sclerosing mesenteritis: Secondary | ICD-10-CM | POA: Insufficient documentation

## 2014-12-08 NOTE — Progress Notes (Signed)
   Subjective:    Patient ID: [name] [name], female    DOB: Feb 12, 1941, 74 y.o.   MRN: [id number]  HPI 74 year old female presents for same day appointment with complaints of a "knot" on her left side.  Patient reports that yesterday after she was drying off from a shower she noticed a "knot" on her left flank. She reports that the area was slightly tender. She does not recall any fall, trauma, injury. No surrounding redness. No recent fevers chills. She is otherwise feeling well.  Review of Systems Per HPI    Objective:   Physical Exam Filed Vitals:   12/04/14 1143  BP: 113/73  Pulse: 88  Temp: 98.6 F (37 C)   Vital signs reviewed.  Exam: General: well appearing elderly female in no acute distress. MSK/Abdomen: Left flank/left upper abdomen - small nodule noted in the subcutaneous tissue. Mildly tender to palpation. No surrounding erythema or bruising. No open wound or drainage.     Assessment & Plan:  See Problem List

## 2014-12-08 NOTE — Assessment & Plan Note (Signed)
Patient's physical exam appears to be consistent with fat necrosis. Patient reassured. Patient to follow closely with PCP.

## 2014-12-22 ENCOUNTER — Telehealth: Payer: Self-pay | Admitting: Family Medicine

## 2014-12-22 DIAGNOSIS — M353 Polymyalgia rheumatica: Secondary | ICD-10-CM

## 2014-12-22 NOTE — Telephone Encounter (Signed)
Pt called because she needs a new referral to a rheumatologist. She was suppose to go see Dr. Charlestine Night but missed to many appointments and now can not go there. jw

## 2014-12-24 ENCOUNTER — Ambulatory Visit (INDEPENDENT_AMBULATORY_CARE_PROVIDER_SITE_OTHER): Payer: PPO | Admitting: Family Medicine

## 2014-12-24 ENCOUNTER — Encounter: Payer: Self-pay | Admitting: Family Medicine

## 2014-12-24 VITALS — BP 122/70 | HR 107 | Temp 98.3°F | Ht 60.0 in | Wt 144.0 lb

## 2014-12-24 DIAGNOSIS — R112 Nausea with vomiting, unspecified: Secondary | ICD-10-CM | POA: Diagnosis not present

## 2014-12-24 DIAGNOSIS — R5383 Other fatigue: Secondary | ICD-10-CM | POA: Diagnosis not present

## 2014-12-24 DIAGNOSIS — R300 Dysuria: Secondary | ICD-10-CM | POA: Diagnosis not present

## 2014-12-24 LAB — POCT UA - MICROSCOPIC ONLY: WBC, Ur, HPF, POC: >20

## 2014-12-24 LAB — POCT URINALYSIS DIPSTICK
Bilirubin, UA: NEGATIVE
Blood, UA: NEGATIVE
Glucose, UA: NEGATIVE
Ketones, UA: NEGATIVE
Nitrite, UA: NEGATIVE
Protein, UA: NEGATIVE
Spec Grav, UA: 1.01
Urobilinogen, UA: 0.2
pH, UA: 6.5

## 2014-12-24 NOTE — Progress Notes (Signed)
   Subjective:    Patient ID: [name] [name], female    DOB: 1941-04-23, 75 y.o.   MRN: [id number]  HPI  CC: not eating  # Not eating/feeling well:  Started about 1 week ago with decreased appetite. She feels like she has a Paediatric nurse" in her stomach and that she can't eat.  Last 2 nights has had vomiting, no blood just food contents  She has felt persistently nauseas throughout the entire time  She also feels weak all over  She has a history of heartburn and feels it is worse recently despite taking her protonix and some tums  Current episode feels similar to 2 episodes in the past (had c. Diff in April, and has history of UTI)  Does have some dysuria and polyuria (but also says drinking more often)  Some lower abdominal pain, moreso on the right, feels crampy. Was not complaining to daughter about any pain with movement but when moving to exam table and laying down she said she was having pain  Diet: eats crackers, did have some egg sandwich and chicken past few nights and ate all of it, tries to drink 2 ensures a day  Endorses some trouble swallowing, cannot say over what time frame, that is worse with solids and sometimes feels like food gets stuck ROS: chills ("not bad"), no fevers, no hematemesis, no diarrhea, no hematochezia. No CP, no SOB.   Review of Systems   See HPI for ROS. All other systems reviewed and are negative.  Past medical history, surgical, family, and social history reviewed and updated in the EMR as appropriate. Objective:  BP 122/70 mmHg  Pulse 107  Temp(Src) 98.3 F (36.8 C) (Oral)  Ht 5' (1.524 m)  Wt 144 lb (65.318 kg)  BMI 28.12 kg/m2 Vitals and nursing note reviewed  General: NAD, does have some shaking of upper extremities CV: tachycardic regular rhythm, normal heart sounds, no murmur appreciated. 2+ radial pulses Resp: CTAB, normal effort Abdomen: active bowel sounds, tender RLQ and RUQ with no guarding or rebound.  Neuro: alert and  oriented, able to ambulate with rolling walker. Visible shaking upper extremities, grip strength normal bilaterally  Assessment & Plan:  See Problem List Documentation

## 2014-12-24 NOTE — Assessment & Plan Note (Signed)
Nonspecific/vague symptoms for the past week, primarily complaining of nausea with some vomiting but on further questioning endorses dysuria and history of UTIs with similar symptoms. Of note last time she was treated for UTI she developed c. Diff and was hospitalized a few months ago for this. UA shows mod leuks, many bacteria. Will hold for 1 day to get urine culture before initiating treatment after discussion with patient because of her risk of cdiff, encouraged if we do treat with antibiotics to take a probiotic/yogurt to try and prevent this. Offered zofran but pt reports having a leftover prescription from April at home which she can use. Continue hydration. If worsens RTC/UC/ED. F/u discussed next week but pt/daughter planning on 3-4 day trip to [place], so will f/u PRN.

## 2014-12-24 NOTE — Telephone Encounter (Signed)
Covering for Dr. Wendi Snipes.  Based on chart review, referral to rheumatology for polymyalgia placed today. It may take some time to get her an appointment.Thanks! --CMS

## 2014-12-25 ENCOUNTER — Telehealth: Payer: Self-pay | Admitting: Family Medicine

## 2014-12-25 NOTE — Telephone Encounter (Signed)
Daughter called and would like to know what the lab results were. Plus she said the doctor was going to call in some type of medication for the patient. Not the Plavix she picked that up but something else. jw

## 2014-12-26 MED ORDER — AMOXICILLIN-POT CLAVULANATE 875-125 MG PO TABS: 1.0000 | ORAL_TABLET | Freq: Two times a day (BID) | ORAL | Status: DC

## 2014-12-26 NOTE — Telephone Encounter (Signed)
Called daughter and sent in rx for augmentin. Daughter states picked up probiotic already and will be taking it. Daughter and patient were on the way to [place] this AM but will pick up prescription from [place] before going. No further questions. Will follow up sensitivity result in epic and if needed switch antibiotic. -Dr. Lamar Benes

## 2014-12-27 LAB — URINE CULTURE: Colony Count: >=100000

## 2015-01-06 ENCOUNTER — Encounter: Payer: Self-pay | Admitting: Family Medicine

## 2015-01-06 DIAGNOSIS — M5416 Radiculopathy, lumbar region: Secondary | ICD-10-CM | POA: Insufficient documentation

## 2015-01-07 ENCOUNTER — Encounter: Payer: Self-pay | Admitting: Family Medicine

## 2015-01-07 ENCOUNTER — Telehealth: Payer: Self-pay | Admitting: Family Medicine

## 2015-01-07 MED ORDER — TRAMADOL HCL 50 MG PO TABS: 50.0000 mg | ORAL_TABLET | Freq: Four times a day (QID) | ORAL | Status: DC | PRN

## 2015-01-07 NOTE — Telephone Encounter (Signed)
Called and spoke with patient about need of follow up CT chest for pulmonary nodules. They were stable last CT but 1 more CT was recommended. She will discuss this with her next physician visit. Her Daughter was also informed.   Refilled tramadol.   Laroy Apple, MD [employer] Resident, PGY-3 01/07/2015, 3:40 PM

## 2015-01-12 ENCOUNTER — Encounter: Payer: Self-pay | Admitting: *Deleted

## 2015-01-15 ENCOUNTER — Ambulatory Visit (INDEPENDENT_AMBULATORY_CARE_PROVIDER_SITE_OTHER): Payer: PPO | Admitting: Cardiovascular Disease

## 2015-01-15 ENCOUNTER — Encounter: Payer: Self-pay | Admitting: Cardiovascular Disease

## 2015-01-15 VITALS — BP 120/60 | HR 88 | Ht 60.0 in | Wt 145.0 lb

## 2015-01-15 DIAGNOSIS — I739 Peripheral vascular disease, unspecified: Secondary | ICD-10-CM

## 2015-01-15 NOTE — Patient Instructions (Addendum)
Medication Instructions:  NO CHANGES  Labwork: NONE  Testing/Procedures: Your physician has requested that you have a lower or upper extremity arterial duplex. This test is an ultrasound of the arteries in the legs or arms. It looks at arterial blood flow in the legs and arms. Allow one hour for Lower and Upper Arterial scans. There are no restrictions or special instructions   LOWER  WITH  ABIS   CLAUDICATION  Follow-Up: Your physician wants you to follow-up in: [place] will receive a reminder letter in the mail two months in advance. If you don't receive a letter, please call our office to schedule the follow-up appointment. You have been referred to  DR  JONATHAN  BERRY  PV EVAL  Any Other Special Instructions Will Be Listed Below (If Applicable).

## 2015-01-15 NOTE — Progress Notes (Signed)
Patient ID: [name] [name], female   DOB: [birth year], 74 y.o.   MRN: [id number]     Cardiology Office Note   Date:  01/15/2015   ID:  [name] [name], DOB 1941/05/01, MRN [id number]  PCP:  Georges Lynch, MD  Cardiologist:   Jenkins Rouge, MD   Chief Complaint  Patient presents with  . Follow-up    Palpitations      History of Present Illness: [name] [name] is a 74 y.o. female who presents for evaluation of palpitatons.  Last seen in 2013.  At that time seen for dyspnea  Extensive w/u. Reviewed records. Had reticulonodular pattern on Xray. CT negative for PE. Currently on zythromax for ? Infectious process. Has vascular disease. [place] Previous LE stent and no current claudication. Has known carotid bruits and gets US neck every 6 months with no tight lesions. Had stress myovue in hospital 2013  which was normal. Had atypical chest pain post cervical laminectomy and is still weak. No current pain. Dyspnea improved Radiology indicates need for f/u CT for lung nodules. Primary is [name] [name] in [place]  Carotid 10/06/13  Plaque no stenosis Echo 10/05/13  EF 55-60% no valve disease   She denies SSCP.  Had been seeing vascular doctor at [facility] / [place] but wants to change. Has mild RLE claudication Quit smoking 3 years ago   Past Medical History  Diagnosis Date  . Hypertension   . Heart murmur   . Pneumonia   . Anxiety   . Depression   . Psoriasis   . Arthritis   . Hypercholesteremia   . Congenital absence of one kidney     Pt has right kidney only  . Fibromyalgia   . Carpal tunnel syndrome, bilateral   . Chronic kidney disease     CKD stage III, absence of left kidney  . Peripheral vascular disease     Dr. Benjamine Sprague; right external iliac stent '06 with angioplasty '13  . Polymyalgia rheumatica     Past Surgical History  Procedure Laterality Date  . Abdominal hysterectomy      Partial   . Appendectomy    . Tonsillectomy    . Eye surgery    . Iliac artery  stent    . Anterior cervical decomp/discectomy fusion  04/19/2012    Procedure: ANTERIOR CERVICAL DECOMPRESSION/DISCECTOMY FUSION 2 LEVELS;  Surgeon: [name] Cunas, MD;  Location: [place] [facility];  Service: Neurosurgery;  Laterality: N/A;  Cervical four-five,Cervical five-six anterior cervical decompression with fusion plating and bonegraft possible posterior cervical decompression  . Carpal tunnel release  1990  . Cervical fusion  04/19/2012     Current Outpatient Prescriptions  Medication Sig Dispense Refill  . amoxicillin-clavulanate (AUGMENTIN) 875-125 MG per tablet Take 1 tablet by mouth 2 (two) times daily. 10 tablet 0  . Aspirin-Salicylamide-Caffeine (BC HEADACHE POWDER PO) Take 1 packet by mouth every 6 (six) hours as needed (pain).    . chlorthalidone (HYGROTON) 25 MG tablet TAKE ONE TABLET BY MOUTH ONCE DAILY 30 tablet 11  . Cholecalciferol (VITAMIN D) 2000 UNITS tablet Take 2,000 Units by mouth daily.    . clopidogrel (PLAVIX) 75 MG tablet Take 1 tablet (75 mg total) by mouth every morning. 90 tablet 3  . feeding supplement, ENSURE COMPLETE, (ENSURE COMPLETE) LIQD Take 237 mLs by mouth 2 (two) times daily between meals. 60 Bottle 11  . fenofibrate 160 MG tablet Take 1 tablet (160 mg total) by mouth daily. Edon  tablet 3  . fluticasone (FLONASE) 50 MCG/ACT nasal spray Place 1 spray into both nostrils daily as needed for allergies or rhinitis (congestion). 16 g 11  . gabapentin (NEURONTIN) 100 MG capsule Take 2 capsules (200 mg total) by mouth 3 (three) times daily. 180 capsule 5  . Multiple Vitamin (MULTIVITAMIN WITH MINERALS) TABS tablet Take 1 tablet by mouth daily.    [name] nystatin cream (MYCOSTATIN) Apply 1 application topically 2 (two) times daily. 30 g 0  . ondansetron (ZOFRAN ODT) 4 MG disintegrating tablet Take 1 tablet (4 mg total) by mouth every 4 (four) hours as needed for nausea or vomiting. 20 tablet 0  . pantoprazole (PROTONIX) 20 MG tablet Take 1 tablet (20 mg total) by mouth  daily. 90 tablet 3  . Polyethyl Glycol-Propyl Glycol (SYSTANE OP) Place 1 drop into both eyes 2 (two) times daily.    . sertraline (ZOLOFT) 100 MG tablet Take 1 tablet (100 mg total) by mouth 2 (two) times daily. 60 tablet 11  . sodium chloride (MURO 128) 5 % ophthalmic ointment Place 1 application into both eyes 3 (three) times daily.    . traMADol (ULTRAM) 50 MG tablet Take 1 tablet (50 mg total) by mouth every 6 (six) hours as needed for moderate pain. 60 tablet 2  . traZODone (DESYREL) 50 MG tablet Take 50 mg by mouth at bedtime. Pt takes 1/2 tablet by mouth at bedtime.     No current facility-administered medications for this visit.    Allergies:   Cyclobenzaprine and Statins    Social History:  The patient  reports that she quit smoking about 4 years ago. Her smoking use included Cigarettes. She has a 40 pack-year smoking history. She has never used smokeless tobacco. She reports that she does not drink alcohol or use illicit drugs.   Family History:  The patient's family history includes Angina in her mother; Cancer in her sister; Heart attack in her father; Melanoma in her brother.    ROS:  Please see the history of present illness.   Otherwise, review of systems are positive for none.   All other systems are reviewed and negative.    PHYSICAL EXAM: VS:  BP 120/60 mmHg  Pulse 88  Ht 5' (1.524 m)  Wt 65.772 kg (145 lb)  BMI 28.32 kg/m2  SpO2 97% , BMI Body mass index is 28.32 kg/(m^2). Affect appropriate Chronically ill white female  HEENT: normal Neck supple with no adenopathy JVP normal bilateral  bruits no thyromegaly Lungs clear with no wheezing and good diaphragmatic motion Heart:  S1/S2 AV sclerosis  murmur, no rub, gallop or click PMI normal Abdomen: benighn, BS positve, no tenderness, no AAA Bilateral femoral  bruit.  No HSM or HJR Distal pulses intact with no bruits No edema Neuro non-focal Chronic venous changes in LE;s  No muscular weakness    EKG:      10/20/14  SR rate 76 normal   Recent Labs: 03/09/2014: Pro B Natriuretic peptide (BNP) 333.40* 10/19/2014: ALT 12 10/26/2014: BUN 5*; Creatinine, Ser 0.98; Hemoglobin 10.6*; Magnesium 1.5; Platelets 148*; Potassium 4.2; Sodium 140    Lipid Panel    Component Value Date/Time   CHOL 236* 06/27/2013 0909   TRIG 112 06/27/2013 0909   HDL 76 06/27/2013 0909   CHOLHDL 3.1 06/27/2013 0909   VLDL 22 06/27/2013 0909   LDLCALC 138* 06/27/2013 0909      Wt Readings from Last 3 Encounters:  01/15/15 65.772 kg (145 lb)  12/24/14  65.318 kg (144 lb)  12/04/14 69.4 kg (153 lb)      Other studies Reviewed: Additional studies/ records that were reviewed today include: Records from [facility] notes and echo .    ASSESSMENT AND PLAN:  1.  Bruit:  F/U duplex in a year plaque no stenosis ASA/Plavix 2. PVD mild RLE claudication ? History of iliac stent.  ABI's with LE arterial duplex f/u with Dr Gwenlyn Found for PV 3. HTN:  Well controlled.  Continue current medications and low sodium Dash type diet.   4. Chol:  Intolerant to statins does not want referral for praluant  5. Benign ECG ok EF normal by echo last year with PVD no beta blocker at this time   Current medicines are reviewed at length with the patient today.  The patient does not have concerns regarding medicines.  The following changes have been made:  no change  Labs/ tests ordered today include: ABI LE arterial duplex  No orders of the defined types were placed in this encounter.     Disposition:   FU with Dr Gwenlyn Found for [place] next available and with me in a year      Signed, Jenkins Rouge, MD  01/15/2015 4:16 PM    [facility] [place], [place], [place]  [address] Phone: [phone]; Fax: [phone]

## 2015-01-18 ENCOUNTER — Encounter: Payer: Self-pay | Admitting: Internal Medicine

## 2015-01-18 ENCOUNTER — Ambulatory Visit (INDEPENDENT_AMBULATORY_CARE_PROVIDER_SITE_OTHER): Payer: PPO | Admitting: Internal Medicine

## 2015-01-18 ENCOUNTER — Ambulatory Visit: Payer: PPO | Admitting: Family Medicine

## 2015-01-18 VITALS — BP 112/64 | HR 99 | Temp 98.1°F | Wt 144.0 lb

## 2015-01-18 DIAGNOSIS — I8311 Varicose veins of right lower extremity with inflammation: Secondary | ICD-10-CM | POA: Diagnosis not present

## 2015-01-18 DIAGNOSIS — I872 Venous insufficiency (chronic) (peripheral): Secondary | ICD-10-CM | POA: Insufficient documentation

## 2015-01-18 DIAGNOSIS — I8312 Varicose veins of left lower extremity with inflammation: Secondary | ICD-10-CM | POA: Diagnosis not present

## 2015-01-18 DIAGNOSIS — Z0000 Encounter for general adult medical examination without abnormal findings: Secondary | ICD-10-CM

## 2015-01-18 MED ORDER — HYDROCORTISONE 2.5 % EX OINT: TOPICAL_OINTMENT | Freq: Two times a day (BID) | CUTANEOUS | Status: DC

## 2015-01-18 NOTE — Assessment & Plan Note (Addendum)
Appears that pt has stasis dermatitis in LE bilat due to color changes and breakdown of thin skin. Additionally, most likely had neurodermatitis in UE bilat from scratching of thin skin. Have prescribed hydrocortisone ointment and advised pt to use BID as well as spot treatment throughout the day prn. Counseled pt to use Dove or Basis soap.

## 2015-01-18 NOTE — Patient Instructions (Signed)
It was great seeing you today!   1. I will let you know about the results of your hepatitis test.  2. Please use the ointment I am prescribing for your itchy, dry skin and areas of irritation.    Please bring all your medications to every doctors visit  Sign up for My Chart to have easy access to your labs results, and communication with your Primary care physician.  Next Appointment  Please call to make an appointment with Dr. Alease Frame in 3 months.   I look forward to talking with you again at our next visit. If you have any questions or concerns before then, please call the clinic at [phone].  Take Care,   Dr. Phill Myron

## 2015-01-18 NOTE — Progress Notes (Signed)
Subjective:    Patient ID: [name] [name], female    DOB: 1940/08/10, 74 y.o.   MRN: [id number]  HPI  CC: Pruritis of Skin  Has areas of skin degradation on upper extremities and lower extremities bilaterally. States that has had "poor" skin for some period of time. Skin is itchy and occasionally she has open sores from scratching. She has been applying lotion to the areas multiple times a day with no relief. She has also tried Nystatin cream that she was previously given for intertrigo.   Additionally, she is concerned about the possibility of having hepatitis because her former co-worker recently passed away from complications of untreated hepatitis. She notes that she has never been vaccinated against hepatitis.    ROS: Denies fever, fatigue, sudden weight gain or loss, N/V/D    Review of Systems   See HPI for ROS. All other systems reviewed and are negative.  Past medical history, surgical, family, and social history reviewed and updated in the EMR as appropriate. Objective:  BP 112/64 mmHg  Pulse 99  Temp(Src) 98.1 F (36.7 C) (Oral)  Wt 144 lb (65.318 kg) Vitals and nursing note reviewed  Gen: Obese female, NAD CV: RRR, bilat pedal pulses intact and equal  Lungs: CTAB Ext: chronic breakdown and color changes to all 4 ext bilat, 1+ edema in LE bilat      Assessment & Plan:  See Problem List Documentation  Have ordered Hepatitis B lab. Counseled patient that it is unlikely that she will be positive for Hepatitis due to her recent normal liver panel labs as well as the fact that Hep B is spread via blood and sexual contact with an infected person, and not merely through skin-to-skin contact. Patient to decide if she desires hepatitis vaccine after she receives her results.   Phill Myron, D.O. 01/18/2015, 3:35 PM PGY-1, [place]

## 2015-01-18 NOTE — Assessment & Plan Note (Signed)
>>  ASSESSMENT AND PLAN FOR STASIS DERMATITIS OF BOTH LEGS WRITTEN ON 01/18/2015  3:40 PM BY WALLACE, CATHERINE LAUREN, DO  Appears that pt has stasis dermatitis in LE bilat due to color changes and breakdown of thin skin. Additionally, most likely had neurodermatitis in UE bilat from scratching of thin skin. Have prescribed hydrocortisone  ointment and advised pt to use BID as well as spot treatment throughout the day prn. Counseled pt to use Dove or Basis soap.

## 2015-01-19 LAB — HEPATITIS B SURFACE ANTIGEN: Hepatitis B Surface Ag: NEGATIVE

## 2015-01-20 ENCOUNTER — Telehealth (HOSPITAL_COMMUNITY): Payer: Self-pay | Admitting: *Deleted

## 2015-01-21 ENCOUNTER — Encounter: Payer: Self-pay | Admitting: Internal Medicine

## 2015-01-26 ENCOUNTER — Ambulatory Visit (HOSPITAL_COMMUNITY)
Admission: RE | Admit: 2015-01-26 | Discharge: 2015-01-26 | Disposition: A | Discharge status: Home / Self Care | Payer: PPO | Source: Ambulatory Visit | Attending: Cardiovascular Disease | Admitting: Cardiovascular Disease

## 2015-01-26 DIAGNOSIS — M25552 Pain in left hip: Secondary | ICD-10-CM | POA: Insufficient documentation

## 2015-01-26 DIAGNOSIS — M549 Dorsalgia, unspecified: Secondary | ICD-10-CM | POA: Diagnosis not present

## 2015-01-26 DIAGNOSIS — E785 Hyperlipidemia, unspecified: Secondary | ICD-10-CM | POA: Insufficient documentation

## 2015-01-26 DIAGNOSIS — I10 Essential (primary) hypertension: Secondary | ICD-10-CM | POA: Insufficient documentation

## 2015-01-26 DIAGNOSIS — I739 Peripheral vascular disease, unspecified: Secondary | ICD-10-CM | POA: Insufficient documentation

## 2015-01-26 DIAGNOSIS — G8929 Other chronic pain: Secondary | ICD-10-CM | POA: Diagnosis not present

## 2015-02-04 ENCOUNTER — Encounter: Payer: Self-pay | Admitting: Family Medicine

## 2015-02-04 NOTE — Telephone Encounter (Deleted)
Want a referral dermatologist in [place].  Will call back with the name of the office and ph and fax number.

## 2015-02-05 ENCOUNTER — Other Ambulatory Visit: Payer: Self-pay | Admitting: Family Medicine

## 2015-02-15 NOTE — Telephone Encounter (Signed)
This encounter was created in error - please disregard.

## 2015-02-17 ENCOUNTER — Ambulatory Visit (INDEPENDENT_AMBULATORY_CARE_PROVIDER_SITE_OTHER): Payer: PPO | Admitting: Family Medicine

## 2015-02-17 ENCOUNTER — Encounter: Payer: Self-pay | Admitting: Family Medicine

## 2015-02-17 VITALS — BP 146/72 | HR 96 | Temp 98.7°F | Ht 60.0 in | Wt 143.5 lb

## 2015-02-17 DIAGNOSIS — R399 Unspecified symptoms and signs involving the genitourinary system: Secondary | ICD-10-CM | POA: Diagnosis not present

## 2015-02-17 DIAGNOSIS — B372 Candidiasis of skin and nail: Secondary | ICD-10-CM

## 2015-02-17 LAB — POCT URINALYSIS DIPSTICK
Bilirubin, UA: NEGATIVE
Blood, UA: NEGATIVE
Glucose, UA: NEGATIVE
Ketones, UA: NEGATIVE
Nitrite, UA: NEGATIVE
Protein, UA: NEGATIVE
Spec Grav, UA: 1.015
Urobilinogen, UA: 0.2
pH, UA: 7

## 2015-02-17 LAB — POCT UA - MICROSCOPIC ONLY

## 2015-02-17 MED ORDER — NYSTATIN 100000 UNIT/GM EX POWD: 1.0000 g | Freq: Two times a day (BID) | CUTANEOUS | Status: DC

## 2015-02-17 NOTE — Patient Instructions (Signed)
It was a pleasure seeing you today in our clinic. Today we discussed your rash and history of UTIs. Here is the treatment plan we have discussed and agreed upon together:   - I have ordered a new powder for your skin. Please apply these to the effected areas twice daily. Try your best to keep these areas clean and dry. - If you continue to have these issues I would like to see you back and I my prescribe a new oral medication -- but for now lets try this topical powder - If you would like me to look at some of your skin areas please schedule an appointment for this/

## 2015-02-23 DIAGNOSIS — B372 Candidiasis of skin and nail: Secondary | ICD-10-CM | POA: Insufficient documentation

## 2015-02-23 NOTE — Assessment & Plan Note (Signed)
Patient's physical exam findings and history most consistent w/ Candidal infection. Differential includes tinea corporis and atopic dermatitis.  - Nystatin powder prescription written.  - If rash does not respond well to Nystatin then will change treatment course to treat for Tinea Corporis instead.

## 2015-02-23 NOTE — Progress Notes (Signed)
   HPI  CC: follow-up Patient states she had been experiencing symptoms of a UTI recently. Symptoms included just urinary frequency and odor. No dysuria, fever, or pelvic/flank pain. These symptoms are currently self resolved. Patient also c/o some skin irritation at the inframammary folds. She has experienced this in the past. She typically uses a "goldbond" powder for this but has recently started using a steroid ointment on this area (which was originally prescribed for her stasis dermatitis). Patient states the skin rash is mild, slightly tender, and erythematous. No recent change in laundry detergent, soaps, or body wash.  ROS: denies fever, chills, HA, SOB, n/v/d, CP, ST, abdominal pain, dysuria.  All other systems reviewed and are negative.  Past medical history and social history reviewed and updated in the EMR as appropriate.  Objective: BP 146/72 mmHg  Pulse 96  Temp(Src) 98.7 F (37.1 C) (Oral)  Ht 5' (1.524 m)  Wt 143 lb 8 oz (65.091 kg)  BMI 28.03 kg/m2 Gen: NAD, alert, cooperative HEENT: NCAT, EOMI, PERRL CV: RRR, no murmur Resp: CTAB, no wheezes Abd: SNTND, BS present, no guarding or organomegaly Integument: slightly raised erythematous rash noted at inframammary folds bilaterally. Some skin dryness noted at effected region. No evidence of pustules/vescicles.   Assessment and plan:  Candidal intertrigo Patient's physical exam findings and history most consistent w/ Candidal infection. Differential includes tinea corporis and atopic dermatitis.  - Nystatin powder prescription written.  - If rash does not respond well to Nystatin then will change treatment course to treat for Tinea Corporis instead.    Orders Placed This Encounter  Procedures  . Urinalysis Dipstick  . POCT UA - Microscopic Only    Meds ordered this encounter  Medications  . nystatin (MYCOSTATIN/NYSTOP) 100000 UNIT/GM POWD    Sig: Apply 1 g topically 2 (two) times daily.    Dispense:  15 g   Refill:  2     Elberta Leatherwood, MD,MS,  PGY2 02/23/2015 2:30 PM

## 2015-03-04 ENCOUNTER — Other Ambulatory Visit: Payer: Self-pay | Admitting: *Deleted

## 2015-03-04 ENCOUNTER — Telehealth: Payer: Self-pay | Admitting: *Deleted

## 2015-03-04 NOTE — Telephone Encounter (Signed)
No need for now

## 2015-03-04 NOTE — Telephone Encounter (Signed)
REVIEWED  OLD SCHEDULE AND  APPEARS  YOU WANTED  PT  T O SEE  DR   BERRY  FOR PV  EVAL    AFTER  LOWER ART    WITH ABI'S . TEST  WAS  OKAY  DO  YOU  STILL WANT  PT  TO SEE DR BERRY ?

## 2015-03-08 NOTE — Telephone Encounter (Signed)
NOTED ./CY 

## 2015-03-10 ENCOUNTER — Ambulatory Visit (INDEPENDENT_AMBULATORY_CARE_PROVIDER_SITE_OTHER): Payer: PPO | Admitting: Family Medicine

## 2015-03-10 DIAGNOSIS — L989 Disorder of the skin and subcutaneous tissue, unspecified: Secondary | ICD-10-CM

## 2015-03-10 DIAGNOSIS — M25561 Pain in right knee: Secondary | ICD-10-CM | POA: Diagnosis not present

## 2015-03-10 DIAGNOSIS — Z23 Encounter for immunization: Secondary | ICD-10-CM | POA: Diagnosis not present

## 2015-03-10 MED ORDER — SERTRALINE HCL 100 MG PO TABS: 100.0000 mg | ORAL_TABLET | Freq: Two times a day (BID) | ORAL | Status: DC

## 2015-03-10 MED ORDER — TRAMADOL HCL 50 MG PO TABS: 100.0000 mg | ORAL_TABLET | Freq: Two times a day (BID) | ORAL | Status: DC | PRN

## 2015-03-10 MED ORDER — CLOPIDOGREL BISULFATE 75 MG PO TABS: 75.0000 mg | ORAL_TABLET | Freq: Every morning | ORAL | Status: DC

## 2015-03-10 NOTE — Patient Instructions (Signed)
It was a pleasure seeing you today in our clinic. Today we discussed your knee pain and skin lesions. Here is the treatment plan we have discussed and agreed upon together:   - I have sent a referral out to a Dermatologist specialist. They will contact you to schedule an appointment. In the mean time, continue the skin cream/powder regimen we have in place. - I have refilled your tramadol. You can take 2 pills twice a day. If you pain persists or worsens then I am more than happy to provide a knee injection to that knee.

## 2015-03-13 DIAGNOSIS — L989 Disorder of the skin and subcutaneous tissue, unspecified: Secondary | ICD-10-CM | POA: Insufficient documentation

## 2015-03-13 DIAGNOSIS — M25561 Pain in right knee: Secondary | ICD-10-CM | POA: Insufficient documentation

## 2015-03-13 NOTE — Progress Notes (Signed)
   HPI  CC: Lt Knee pain nd skin lesion Patient here w/ complaints of left knee pain. Pain located medially at the joint line and infrapatellar. No history of trauma. Reports pain is achy in nature. Worse at night and prolonged standing. Some clicking and catching. Denies significant swelling or reduced ROM.  Patient also complaining of a skin lesion under her Rt breast. Lesion has been present for ~13yr. It does not bleed or weep but can occasionally get irritated and become both itchy and painful. Patient reports noting other lesions around her axilla and breast which she describes as "skin tags". Would like to see dermatologist.  Review of Systems   See HPI for ROS. All other systems reviewed and are negative.  Past medical history and social history reviewed and updated in the EMR as appropriate.  Objective: There were no vitals taken for this visit. Gen: NAD, alert, cooperative, and pleasant. HEENT: NCAT, EOMI, PERRL CV: RRR, no murmur Resp: CTAB, no wheezes, non-labored Integument: intact, warm, dry; skin tags noted in axilla bilaterally, as well as at the inframammary folds bilaterally. One large skin lesion noted at the Rt mid-inframammary fold ~2x2cm raised, flesh colored, with keratinized surface.  Ext: No edema, warm. ROM reduced equally in knees bilaterally. All 4 ligs w/ strong endpoints. Patellar crepitus noted. No effusions, no bony deformities.  Neuro: Alert and oriented, Speech clear, No gross deficits  Assessment and plan:  Right knee pain Likely multifactorial. Patient likely has OA w/ some degenerative meniscus tearing. LE strength is weak >> PFPS also likely in play. - encouraged strengthening exercises and staying active. - Tramadol refill - CSI may be beneficial in the future. - Ice, compression and rest when aggravated.  Skin lesion Right Inframammary fold >> unknown etiology at this time. No ulceration or telangectasia appreciated. Likely benign but with  history of BCC I think a referral to Derm is warranted.    Orders Placed This Encounter  Procedures  . Flu Vaccine QUAD 36+ mos IM  . Ambulatory referral to Dermatology    Referral Priority:  Routine    Referral Type:  Consultation    Referral Reason:  Specialty Services Required    Requested Specialty:  Dermatology    Number of Visits Requested:  1    Meds ordered this encounter  Medications  . traMADol (ULTRAM) 50 MG tablet    Sig: Take 2 tablets (100 mg total) by mouth 2 (two) times daily as needed for moderate pain.    Dispense:  60 tablet    Refill:  2  . sertraline (ZOLOFT) 100 MG tablet    Sig: Take 1 tablet (100 mg total) by mouth 2 (two) times daily.    Dispense:  60 tablet    Refill:  11  . clopidogrel (PLAVIX) 75 MG tablet    Sig: Take 1 tablet (75 mg total) by mouth every morning.    Dispense:  90 tablet    Refill:  3     Elberta Leatherwood, MD,MS,  PGY2 03/13/2015 2:28 PM

## 2015-03-13 NOTE — Assessment & Plan Note (Signed)
Likely multifactorial. Patient likely has OA w/ some degenerative meniscus tearing. LE strength is weak >> PFPS also likely in play. - encouraged strengthening exercises and staying active. - Tramadol refill - CSI may be beneficial in the future. - Ice, compression and rest when aggravated.

## 2015-03-13 NOTE — Assessment & Plan Note (Addendum)
Right Inframammary fold >> unknown etiology at this time. No ulceration or telangectasia appreciated. Likely benign but with history of BCC I think a referral to Derm is warranted.

## 2015-03-29 ENCOUNTER — Ambulatory Visit: Payer: PPO | Admitting: Family Medicine

## 2015-04-29 ENCOUNTER — Encounter: Payer: Self-pay | Admitting: Family Medicine

## 2015-04-29 ENCOUNTER — Ambulatory Visit (INDEPENDENT_AMBULATORY_CARE_PROVIDER_SITE_OTHER): Payer: PPO | Admitting: Family Medicine

## 2015-04-29 VITALS — BP 128/80 | HR 74 | Temp 98.2°F | Ht 60.0 in | Wt 145.0 lb

## 2015-04-29 DIAGNOSIS — IMO0002 Reserved for concepts with insufficient information to code with codable children: Secondary | ICD-10-CM

## 2015-04-29 DIAGNOSIS — M25561 Pain in right knee: Secondary | ICD-10-CM

## 2015-04-29 DIAGNOSIS — N8110 Cystocele, unspecified: Secondary | ICD-10-CM

## 2015-04-29 DIAGNOSIS — R300 Dysuria: Secondary | ICD-10-CM | POA: Diagnosis not present

## 2015-04-29 NOTE — Assessment & Plan Note (Addendum)
Patient reports that she is currently on Macrobid for UTI. She states that she has had an increasing frequency of UTIs recently. Patient also reports the sensation of fullness/bulging between her labia that is less prominent after urination. My concern would be that patient has a developing cystocele. - Referral to gynecology placed.

## 2015-04-29 NOTE — Assessment & Plan Note (Signed)
Patient here complaining of persistent/chronic right knee pain. She has received injections in the past with good relief. She has not had an injection into the right knee in > 3 months. - Corticosteroid injection performed today in clinic. - Patient tolerated procedure well. Patient was given postprocedure instructions which included instructions of keeping this site clean as well as instructions regarding symptoms that indicate when to report to medical professional for further assessment. Patient stated her understanding.  Side note: Patient's pain did not respond acutely to today's injection. Anterior lateral approach was used due to suspected ease with this approach when compared to the superior lateral approach (due to her joint space narrowing). If patient reports minimal benefit from this injection overall then strong consideration for a different approach for the next injection.

## 2015-04-29 NOTE — Progress Notes (Signed)
   HPI  CC: Left knee pain Patient is here for a follow-up on her left knee pain. She denies any new issues or injuries to this area. This knee continues to cause this patient significant discomfort throughout the day especially with prolonged standing or walking. She endorses popping and catching with significant pain but she denies any decreased range of motion or feelings of instability. Patient also endorses some occasional swelling/effusion. This is a tendency to wax and wane depending on activity the prior day. Patient is very ingested in receiving a corticosteroid injection today.  ROS: Patient denies any nausea/vomiting/diarrhea, injury, trauma, falls, instability, weakness, numbness, paresthesias  Objective: BP 128/80 mmHg  Pulse 74  Temp(Src) 98.2 F (36.8 C) (Oral)  Ht 5' (1.524 m)  Wt 145 lb (65.772 kg)  BMI 28.32 kg/m2 Gen: NAD, alert, cooperative, and pleasant. Resp: no wheezes, non-labored Ext: No edema, warm, pulses intact bilaterally Right Knee: Normal to inspection with no erythema or effusion. Joint line prominence likely secondary to osteophyte development. Palpation normal with no warmth. Moderate joint line tenderness and patellar tenderness. ROM normal in flexion. Slightly reduced in extension Ligaments with solid consistent endpoints including ACL, PCL, LCL, MCL. +2 Crepitus noted over the patella with passive range of motion Patellar and quadriceps tendons unremarkable. Hamstring and quadriceps strength is normal. Neuro: Alert and oriented, Speech clear, No gross deficits  INJECTION: Right knee Appropriate time out was taken. Area prepped and draped in usual sterile fashion. 1 cc of methylprednisolone 40 mg/ml plus  4 cc of 1% lidocaine without epinephrine was injected into the right knee using a(n) anterior lateral approach. The patient tolerated the procedure well. There were no complications. Post procedure instructions were given.   Assessment and  plan:  Right knee pain Patient here complaining of persistent/chronic right knee pain. She has received injections in the past with good relief. She has not had an injection into the right knee in > 3 months. - Corticosteroid injection performed today in clinic. - Patient tolerated procedure well. Patient was given postprocedure instructions which included instructions of keeping this site clean as well as instructions regarding symptoms that indicate when to report to medical professional for further assessment. Patient stated her understanding.  Side note: Patient's pain did not respond acutely to today's injection. Anterior lateral approach was used due to suspected ease with this approach when compared to the superior lateral approach (due to her joint space narrowing). If patient reports minimal benefit from this injection overall then strong consideration for a different approach for the next injection.   Dysuria Patient reports that she is currently on Macrobid for UTI. She states that she has had an increasing frequency of UTIs recently. Patient also reports the sensation of fullness/bulging between her labia that is less prominent after urination. My concern would be that patient has a developing cystocele. - Referral to gynecology placed.    Orders Placed This Encounter  Procedures  . Ambulatory referral to Gynecology    Referral Priority:  Routine    Referral Type:  Consultation    Referral Reason:  Specialty Services Required    Requested Specialty:  Gynecology    Number of Visits Requested:  1    Elberta Leatherwood, MD,MS,  PGY2 04/29/2015 5:36 PM

## 2015-04-29 NOTE — Patient Instructions (Signed)
It was a pleasure seeing you today in our clinic. Today we discussed your knee pain. Here is the treatment plan we have discussed and agreed upon together:   - Do not submerge the injection site for 24-48 hours. - If your knee gets significantly red swollen painful over the next 24-48 hours report to the nearest emergency department. - Pain relief should be relatively immediate, relief should lessen over the next 24 hours, and an approximately 48 hours steroid should begin taking effect and pain relief should return. - She have any questions or comments do not hesitate to call. - GYN consultation was placed today

## 2015-04-30 ENCOUNTER — Other Ambulatory Visit: Payer: Self-pay | Admitting: Family Medicine

## 2015-05-13 ENCOUNTER — Encounter: Payer: Self-pay | Admitting: Gynecology

## 2015-05-13 ENCOUNTER — Ambulatory Visit (INDEPENDENT_AMBULATORY_CARE_PROVIDER_SITE_OTHER): Payer: PPO | Admitting: Gynecology

## 2015-05-13 ENCOUNTER — Other Ambulatory Visit (HOSPITAL_COMMUNITY)
Admission: RE | Admit: 2015-05-13 | Discharge: 2015-05-13 | Disposition: A | Discharge status: Home / Self Care | Payer: PPO | Source: Ambulatory Visit | Attending: Gynecology | Admitting: Gynecology

## 2015-05-13 VITALS — BP 130/84 | Ht 60.0 in | Wt 150.0 lb

## 2015-05-13 DIAGNOSIS — N952 Postmenopausal atrophic vaginitis: Secondary | ICD-10-CM

## 2015-05-13 DIAGNOSIS — Z1272 Encounter for screening for malignant neoplasm of vagina: Secondary | ICD-10-CM

## 2015-05-13 DIAGNOSIS — N390 Urinary tract infection, site not specified: Secondary | ICD-10-CM | POA: Diagnosis not present

## 2015-05-13 DIAGNOSIS — N9489 Other specified conditions associated with female genital organs and menstrual cycle: Secondary | ICD-10-CM | POA: Diagnosis not present

## 2015-05-13 DIAGNOSIS — Z124 Encounter for screening for malignant neoplasm of cervix: Secondary | ICD-10-CM | POA: Insufficient documentation

## 2015-05-13 DIAGNOSIS — R102 Pelvic and perineal pain: Secondary | ICD-10-CM

## 2015-05-13 LAB — URINALYSIS W MICROSCOPIC + REFLEX CULTURE
Bilirubin Urine: NEGATIVE
Casts: NONE SEEN [LPF]
Crystals: NONE SEEN [HPF]
Glucose, UA: NEGATIVE
Ketones, ur: NEGATIVE
Leukocytes, UA: NEGATIVE
Nitrite: NEGATIVE
Protein, ur: NEGATIVE
Specific Gravity, Urine: 1.01 (ref 1.001–1.035)
Yeast: NONE SEEN [HPF]
pH: 6 (ref 5.0–8.0)

## 2015-05-13 NOTE — Addendum Note (Signed)
Addended by: Nelva Nay on: 05/13/2015 11:20 AM   Modules accepted: Orders

## 2015-05-13 NOTE — Progress Notes (Signed)
[name] [name] Nov 20, 1940 [id number]        73 y.o.  G3P3  New patient presents in consultation from her primary physician [name] [name] due to a 5-6 month history of pelvic pressure and suprapubic discomfort. New-onset recurrent UTIs times several since then. No history of this before.  Does not notice anything protruding from the vagina. Status post TAH postpartum due to hemorrhage in the 1960s. No reported gynecologic issues in the past although has not had a GYN exam in a number of years.  Did have CT scan 10/2014 of the abdomen which showed an atrophic left kidney with renal calculi in the lower pole. No hydronephrosis or hydroureter. No pelvic pathology.   Past medical history,surgical history, problem list, medications, allergies, family history and social history were all reviewed and documented as reviewed in the EPIC chart.  ROS:  Performed with pertinent positives and negatives included in the history, assessment and plan.   Additional significant findings :  none   Exam: [name] [name] Vitals:   05/13/15 1010  BP: 130/84  Height: 5' (1.524 m)  Weight: 150 lb (68.04 kg)   General appearance:  Normal affect, orientation and appearance. Skin: Grossly normal noting numerous seborrheic keratoses covering her entire body. HEENT: Without gross lesions.  No cervical or supraclavicular adenopathy. Thyroid normal.  Lungs:  Clear without wheezing, rales or rhonchi Cardiac: RR, without RMG Abdominal:  Soft, nontender, without masses, guarding, rebound, organomegaly or hernia Breasts:  Examined lying and sitting without masses, retractions, discharge or axillary adenopathy. Pelvic:  Ext/BUS/vagina with atrophic changes. Cuff well supported. No evidence of cystocele or rectocele.  Of cuff done  Adnexa  Without masses or tenderness    Anus and perineum  Normal   Rectovaginal  Normal sphincter tone without palpated masses or tenderness.    Assessment/Plan:  74 y.o. G3P3 female with 5 months  of pelvic/suprapubic pressure and discomfort. Quotes 5 UTIs since then. Exam today is normal status post hysterectomy with atrophic changes. No evidence of cystocele or rectocele. Will check pelvic ultrasound to rule out ovarian disease. She is unsure whether she still has her ovaries or not. Check urinalysis baseline today also. Reviewed with her that assuming ultrasound normal then will refer to urology.  Recent CT April 2016 secondary to lower abdominal discomfort was negative excepting atrophic left kidney which she is known to have and renal calculi in the pole of the left kidney. Pap smear of vaginal cuff done today. No history of abnormal Pap smears previously. If negative then plan no further surveillance. Mammography 08/2014. She'll continue with annual mammography. SBE monthly reviewed. Health maintenance. Patient is up-to-date with both her DEXA and colonoscopies which are monitored through her primary physician's office.  Follow up for her ultrasound results and then ultimate referral to urology.    [name] Auerbach MD, 10:40 AM 05/13/2015

## 2015-05-13 NOTE — Patient Instructions (Signed)
Follow-up for the ultrasound as scheduled. 

## 2015-05-14 LAB — URINE CULTURE
Colony Count: NO GROWTH
Organism ID, Bacteria: NO GROWTH

## 2015-05-17 LAB — CYTOLOGY - PAP

## 2015-05-21 ENCOUNTER — Other Ambulatory Visit: Payer: Self-pay | Admitting: Family Medicine

## 2015-05-21 MED ORDER — SERTRALINE HCL 100 MG PO TABS: 100.0000 mg | ORAL_TABLET | Freq: Two times a day (BID) | ORAL | Status: DC

## 2015-05-21 NOTE — Telephone Encounter (Signed)
Pt needs refill on Zoloft and Tramadol sent to [facility] on [address]. Thank you, Fonda Kinder, ASA

## 2015-05-26 ENCOUNTER — Other Ambulatory Visit: Payer: Self-pay | Admitting: Gynecology

## 2015-05-26 ENCOUNTER — Ambulatory Visit (INDEPENDENT_AMBULATORY_CARE_PROVIDER_SITE_OTHER): Payer: PPO

## 2015-05-26 ENCOUNTER — Ambulatory Visit (INDEPENDENT_AMBULATORY_CARE_PROVIDER_SITE_OTHER): Payer: PPO | Admitting: Gynecology

## 2015-05-26 ENCOUNTER — Encounter: Payer: Self-pay | Admitting: Gynecology

## 2015-05-26 VITALS — BP 124/74

## 2015-05-26 DIAGNOSIS — N83201 Unspecified ovarian cyst, right side: Secondary | ICD-10-CM

## 2015-05-26 DIAGNOSIS — R102 Pelvic and perineal pain: Secondary | ICD-10-CM

## 2015-05-26 DIAGNOSIS — R300 Dysuria: Secondary | ICD-10-CM | POA: Diagnosis not present

## 2015-05-26 DIAGNOSIS — N9489 Other specified conditions associated with female genital organs and menstrual cycle: Secondary | ICD-10-CM

## 2015-05-26 LAB — URINALYSIS W MICROSCOPIC + REFLEX CULTURE
Bilirubin Urine: NEGATIVE
Casts: NONE SEEN [LPF]
Crystals: NONE SEEN [HPF]
Glucose, UA: NEGATIVE
Hgb urine dipstick: NEGATIVE
Ketones, ur: NEGATIVE
Nitrite: NEGATIVE
Protein, ur: NEGATIVE
Specific Gravity, Urine: 1.015 (ref 1.001–1.035)
pH: 7 (ref 5.0–8.0)

## 2015-05-26 MED ORDER — FLUCONAZOLE 150 MG PO TABS: 150.0000 mg | ORAL_TABLET | Freq: Once | ORAL | Status: DC

## 2015-05-26 NOTE — Patient Instructions (Signed)
Office will help arrange appointment with the urologist. Take the one Diflucan pill

## 2015-05-26 NOTE — Progress Notes (Signed)
[name] [name] 12/13/1940 [id number]        73 y.o.  [name] [name] presents for ultrasound in follow up of her complaints of suprapubic/pressure in her pelvis. Status post hysterectomy in the past. Also complaining of dysuria. Has been treated I her history 4 times for UTI just stopping at robotic several weeks ago.  Past medical history,surgical history, problem list, medications, allergies, family history and social history were all reviewed and documented in the EPIC chart.  Directed ROS with pertinent positives and negatives documented in the history of present illness/assessment and plan.  Exam: Filed Vitals:   05/26/15 1151  BP: 124/74   General appearance:  Normal  Ultrasound shows right and left ovaries with small 17 x 6 x 12 mm echo-free negative flow cyst on the right ovary. Cul-de-sac negative.  Assessment/Plan:  74 y.o. G3P3 with negative ultrasound. Urinalysis does show few bacteria with 6-10 WBC few yeast 6-10 squamous cells. Will await culture and sensitivities to direct treatment if needed. Recommend follow up with urologist in reference to her recurrent UTIs and pelvic pressure symptoms. We will help her make this arrangement and she agrees to follow up with them.    Anastasio Auerbach MD, 12:09 PM 05/26/2015

## 2015-05-27 ENCOUNTER — Telehealth: Payer: Self-pay | Admitting: *Deleted

## 2015-05-27 LAB — URINE CULTURE: Colony Count: 6000

## 2015-05-27 NOTE — Telephone Encounter (Signed)
-----   Message from Anastasio Auerbach, MD sent at 05/26/2015 12:11 PM EST ----- Schedule an appointment with urology reference recent onset of recurrent UTIs this year

## 2015-05-27 NOTE — Telephone Encounter (Signed)
Referral faxed they will fax me back with time and date to relay to patient.  

## 2015-05-28 ENCOUNTER — Other Ambulatory Visit: Payer: Self-pay | Admitting: Family Medicine

## 2015-05-28 MED ORDER — GABAPENTIN 100 MG PO CAPS: 200.0000 mg | ORAL_CAPSULE | Freq: Three times a day (TID) | ORAL | Status: DC

## 2015-05-28 NOTE — Telephone Encounter (Signed)
LM for patient to call back.  Please help her schedule an appt for her pain medication. [name] [name],CMA

## 2015-05-28 NOTE — Telephone Encounter (Signed)
Pt is checking status of refills for gabapentin and tramadol, pt goes to [facility]/ [address]

## 2015-06-07 ENCOUNTER — Telehealth: Payer: Self-pay | Admitting: Family Medicine

## 2015-06-07 NOTE — Telephone Encounter (Signed)
Paged to [facility] Emergency Line approx [phone] by family member of patient, [name] [name]. She reports that the patient woke up today around 0400 with a headache. She took some Tylenol and then developed a nose bleed. No significant prior bleeding history. Has had occasional nose bleeds before, unable to recall last one. Takes daily Plavix 75mg  daily. No other blood thinners, but does take some asprin included headache BC powder medicine. Currently with persistent dripping of red blood with nose bleed, she is holding her head over a trash can. Denies any other symptoms, currently HA has resolved, no chest pain or SOB. No other bleeding.  I advised that nose bleeding is likely anterior aspect of nose and usually stops eventually with direct pressure but may need some additional assistance. Her history on Plavix can increase her risk of bleeding, but would not be as concerning as if she was on full anticoagulation. Recommend to again try to tilt head back and use direct firm pressure on anterior nose / bridge for at least 15 minutes straight. If increase frequency of bleeding, new symptoms, or not resolving with pressure, recommended that may need to come in to [facility] ED for further evaluation, may need topical treatment or direct pressure inside nose. But needs further visualization.  Nobie Putnam, [place], PGY-3

## 2015-06-07 NOTE — Telephone Encounter (Signed)
APPOINTMENT ON 07/16/15 @ 3:00PM WITH DR.HERRICK

## 2015-06-08 ENCOUNTER — Encounter (HOSPITAL_COMMUNITY): Payer: Self-pay | Admitting: Emergency Medicine

## 2015-06-08 ENCOUNTER — Emergency Department (HOSPITAL_COMMUNITY)
Admission: EM | Admit: 2015-06-08 | Discharge: 2015-06-08 | Disposition: A | Discharge status: Home / Self Care | Payer: PPO | Attending: Emergency Medicine | Admitting: Emergency Medicine

## 2015-06-08 ENCOUNTER — Ambulatory Visit (INDEPENDENT_AMBULATORY_CARE_PROVIDER_SITE_OTHER): Payer: PPO | Admitting: Family Medicine

## 2015-06-08 ENCOUNTER — Encounter: Payer: Self-pay | Admitting: Family Medicine

## 2015-06-08 ENCOUNTER — Other Ambulatory Visit: Payer: Self-pay

## 2015-06-08 ENCOUNTER — Emergency Department (HOSPITAL_COMMUNITY): Payer: PPO

## 2015-06-08 VITALS — BP 151/103 | HR 90 | Temp 98.3°F | Wt 149.6 lb

## 2015-06-08 DIAGNOSIS — Z8701 Personal history of pneumonia (recurrent): Secondary | ICD-10-CM | POA: Insufficient documentation

## 2015-06-08 DIAGNOSIS — Z8669 Personal history of other diseases of the nervous system and sense organs: Secondary | ICD-10-CM | POA: Diagnosis not present

## 2015-06-08 DIAGNOSIS — Z87891 Personal history of nicotine dependence: Secondary | ICD-10-CM | POA: Insufficient documentation

## 2015-06-08 DIAGNOSIS — Z7902 Long term (current) use of antithrombotics/antiplatelets: Secondary | ICD-10-CM | POA: Insufficient documentation

## 2015-06-08 DIAGNOSIS — R011 Cardiac murmur, unspecified: Secondary | ICD-10-CM | POA: Insufficient documentation

## 2015-06-08 DIAGNOSIS — Z9861 Coronary angioplasty status: Secondary | ICD-10-CM | POA: Insufficient documentation

## 2015-06-08 DIAGNOSIS — F419 Anxiety disorder, unspecified: Secondary | ICD-10-CM | POA: Insufficient documentation

## 2015-06-08 DIAGNOSIS — R079 Chest pain, unspecified: Secondary | ICD-10-CM | POA: Diagnosis not present

## 2015-06-08 DIAGNOSIS — Q600 Renal agenesis, unilateral: Secondary | ICD-10-CM | POA: Insufficient documentation

## 2015-06-08 DIAGNOSIS — R399 Unspecified symptoms and signs involving the genitourinary system: Secondary | ICD-10-CM

## 2015-06-08 DIAGNOSIS — R51 Headache: Secondary | ICD-10-CM | POA: Diagnosis not present

## 2015-06-08 DIAGNOSIS — Z872 Personal history of diseases of the skin and subcutaneous tissue: Secondary | ICD-10-CM | POA: Insufficient documentation

## 2015-06-08 DIAGNOSIS — I129 Hypertensive chronic kidney disease with stage 1 through stage 4 chronic kidney disease, or unspecified chronic kidney disease: Secondary | ICD-10-CM | POA: Insufficient documentation

## 2015-06-08 DIAGNOSIS — Z79899 Other long term (current) drug therapy: Secondary | ICD-10-CM | POA: Insufficient documentation

## 2015-06-08 DIAGNOSIS — N183 Chronic kidney disease, stage 3 (moderate): Secondary | ICD-10-CM | POA: Diagnosis not present

## 2015-06-08 DIAGNOSIS — E7800 Pure hypercholesterolemia, unspecified: Secondary | ICD-10-CM | POA: Insufficient documentation

## 2015-06-08 DIAGNOSIS — M5412 Radiculopathy, cervical region: Secondary | ICD-10-CM

## 2015-06-08 DIAGNOSIS — F329 Major depressive disorder, single episode, unspecified: Secondary | ICD-10-CM | POA: Diagnosis not present

## 2015-06-08 DIAGNOSIS — M1990 Unspecified osteoarthritis, unspecified site: Secondary | ICD-10-CM | POA: Insufficient documentation

## 2015-06-08 LAB — BASIC METABOLIC PANEL
Anion gap: 11 (ref 5–15)
BUN: 14 mg/dL (ref 6–20)
CO2: 25 mmol/L (ref 22–32)
Calcium: 9.4 mg/dL (ref 8.9–10.3)
Chloride: 98 mmol/L — ABNORMAL LOW (ref 101–111)
Creatinine, Ser: 1.03 mg/dL — ABNORMAL HIGH (ref 0.44–1.00)
GFR calc Af Amer: >60 mL/min (ref >60)
GFR calc non Af Amer: 52 mL/min — ABNORMAL LOW (ref >60)
Glucose, Bld: 87 mg/dL (ref 65–99)
Potassium: 3.4 mmol/L — ABNORMAL LOW (ref 3.5–5.1)
Sodium: 134 mmol/L — ABNORMAL LOW (ref 135–145)

## 2015-06-08 LAB — POCT URINALYSIS DIPSTICK
Bilirubin, UA: NEGATIVE
Blood, UA: NEGATIVE
Glucose, UA: NEGATIVE
Ketones, UA: NEGATIVE
Leukocytes, UA: NEGATIVE
Nitrite, UA: NEGATIVE
Protein, UA: NEGATIVE
Spec Grav, UA: 1.015
Urobilinogen, UA: 0.2
pH, UA: 6

## 2015-06-08 LAB — CBC
HCT: 35.2 % — ABNORMAL LOW (ref 36.0–46.0)
Hemoglobin: 12.4 g/dL (ref 12.0–15.0)
MCH: 33.1 pg (ref 26.0–34.0)
MCHC: 35.2 g/dL (ref 30.0–36.0)
MCV: 93.9 fL (ref 78.0–100.0)
Platelets: 153 10*3/uL (ref 150–400)
RBC: 3.75 MIL/uL — ABNORMAL LOW (ref 3.87–5.11)
RDW: 13.7 % (ref 11.5–15.5)
WBC: 7.2 10*3/uL (ref 4.0–10.5)

## 2015-06-08 LAB — I-STAT TROPONIN, ED: Troponin i, poc: 0.01 ng/mL (ref 0.00–0.08)

## 2015-06-08 MED ORDER — OXYCODONE-ACETAMINOPHEN 5-325 MG PO TABS
1.0000 | ORAL_TABLET | Freq: Once | ORAL | Status: AC
  Administered 2015-06-08: 1 via ORAL
  Filled 2015-06-08: qty 1

## 2015-06-08 NOTE — ED Notes (Signed)
Patient ambulated in hallway with steady gait and some assistance.

## 2015-06-08 NOTE — Discharge Instructions (Signed)
Your caregiver has diagnosed you as having chest pain that is not specific for one problem, but does not require admission.  Chest pain comes from many different causes.  °SEEK IMMEDIATE MEDICAL ATTENTION IF: °You have severe chest pain, especially if the pain is crushing or pressure-like and spreads to the arms, back, neck, or jaw, or if you have sweating, nausea (feeling sick to your stomach), or shortness of breath. THIS IS AN EMERGENCY. Don't wait to see if the pain will go away. Get medical help at once. Call 911 or 0 (operator). DO NOT drive yourself to the hospital.  °Your chest pain gets worse and does not go away with rest.  °You have an attack of chest pain lasting longer than usual, despite rest and treatment with the medications your caregiver has prescribed.  °You wake from sleep with chest pain or shortness of breath.  °You feel dizzy or faint.  °You have chest pain not typical of your usual pain for which you originally saw your caregiver. ° °You have neck pain, possibly from a cervical strain and/or pinched nerve.  ° °SEEK IMMEDIATE MEDICAL ATTENTION IF: °You develop difficulties swallowing or breathing.  °You have new or worse numbness, weakness, tingling, or movement problems in your arms or legs.  °You develop increasing pain which is uncontrolled with medications.  °You have change in bowel or bladder function, or other concerns. ° °

## 2015-06-08 NOTE — ED Notes (Signed)
Per [facility] patient complains of left sided chest pain and left arm pain since last night.  Patient has no cardiac history.  Patient received 324 of asa en route.  Patient in no apparent distress at this time.

## 2015-06-08 NOTE — Progress Notes (Signed)
Date of Visit: 06/08/2015   HPI:  Pt presents for a same day appointment to discuss possible UTI.  Patient reported feeling sore in her suprapubic area. Generally does not feel well.  She then endorsed yesterday having stabbing pain to the left side of her chest that radiated to her left arm. Chest pain has not persisted but arm discomfort and tingling has continued. Not short of breath above her baseline. No jaw or unusual neck pain. Has felt sweaty since last night. No nausea but has maybe had some heartburn.   Patient with known history of vasculopathy, has R external iliac artery stent in place. Has not ever had cardiac cath in the past. Had nuclear stress test in October 2013 which read as: "Cannot exclude a small focus of reversibility involving the distal inferolateral wall. Otherwise normal examination without focal wall motion abnormality." According to records, has 40 pack year history of smoking but quit in 2012.   ROS: See HPI  [place]: history of anxiety and depression, basal cell carcinoma, cervical spondylosis, GERD, hyperlipidemia, hypertension, OSA, osteoporosis, polymyalgia rheumatica, PVD, pulmonary nodules  PHYSICAL EXAM: BP 151/103 mmHg  Pulse 90  Temp(Src) 98.3 F (36.8 C) (Oral)  Wt 149 lb 9.6 oz (67.858 kg) Gen: NAD, pleasant, cooperative. Intermittently appears quite uncomfortable with pain going down L arm.  HEENT: NCAT Heart: regular rate and rhythm no murmur Lungs: clear to auscultation bilaterally, NWOB Neuro: alert, grossly nonfocal, speech normal  ASSESSMENT/PLAN:  1. Chest pain & left arm pain - concern for possible ACS given her significant cardiac risk factors and known vasculopathy. EKG performed today and is normal. EMS was called and patient taken to ED for evaluation. Anticipate will need ACS rule out with possible cardiology consultation.  2. Urinary issues - urinalysis collected by clinic staff today prior to me seeing patient and was normal.  Suggests against acute UTI. Defer further eval to the ED at this time giving the pressing nature of her chest pain.  FOLLOW UP: Going immediately to ED via EMS for evaluation.  [place]. Ardelia Mems, [place]

## 2015-06-08 NOTE — ED Provider Notes (Signed)
CSN: [id number]     Arrival date & time 06/08/15  1701 History   First MD Initiated Contact with Patient 06/08/15 1804     Chief Complaint  Patient presents with  . Chest Pain     Patient is a 74 y.o. female presenting with chest pain. The history is provided by the patient.  Chest Pain Pain location:  L chest Pain quality: sharp   Radiates to: back. Pain severity:  Mild Onset quality:  Sudden Duration: 30 seconds. Timing:  Intermittent Progression:  Resolved Chronicity:  New Relieved by:  None tried Worsened by:  Nothing tried Associated symptoms: headache   Associated symptoms: no fever, no shortness of breath, not vomiting and no weakness   Pt is here for two complaints:  She reports brief episode of left sided CP last night while in bed - she reports it lasted about 30 seconds.  It resolved.  She had a brief episode later in the day that was similar that quickly resolved.  She reports pain in left arm over past day - she felt at first it was from her CP, but now thinks it is radiating from neck.  everytime she moves her neck or left arm it hurts.  No new weakness in her arm.    Currently, no active CP/SOB No pain in neck or arm without movement  No h/o CAD/PE   Past Medical History  Diagnosis Date  . Hypertension   . Heart murmur   . Pneumonia   . Anxiety   . Depression   . Psoriasis   . Arthritis   . Hypercholesteremia   . Congenital absence of one kidney     Pt has right kidney only  . Carpal tunnel syndrome, bilateral   . Chronic kidney disease     CKD stage III, absence of left kidney  . Peripheral vascular disease ([place])     Dr. Benjamine Sprague; right external iliac stent '06 with angioplasty '13  . Polymyalgia rheumatica ([facility])    Past Surgical History  Procedure Laterality Date  . Appendectomy    . Tonsillectomy    . Eye surgery    . Iliac artery stent    . Anterior cervical decomp/discectomy fusion  04/19/2012    Procedure: ANTERIOR CERVICAL  DECOMPRESSION/DISCECTOMY FUSION 2 LEVELS;  Surgeon: Winfield Cunas, MD;  Location: [facility];  Service: Neurosurgery;  Laterality: N/A;  Cervical four-five,Cervical five-six anterior cervical decompression with fusion plating and bonegraft possible posterior cervical decompression  . Carpal tunnel release  1990  . Cervical fusion  04/19/2012  . Abdominal hysterectomy  1963    Partial,  Due to bleeding after delivery   Family History  Problem Relation Age of Onset  . Angina Mother   . Heart attack Father   . Melanoma Brother   . Cancer Sister     Lymphoma   Social History  Substance Use Topics  . Smoking status: Former Smoker -- 1.00 packs/day for 40 years    Types: Cigarettes    Quit date: 07/24/2010  . Smokeless tobacco: Never Used     Comment: STARTED BACK JULY 2013 AND RECENTLY QUIT 03-10-2012  . Alcohol Use: No     Comment: QUIT IN 1990    OB History    Gravida Para Term Preterm AB TAB SAB Ectopic Multiple Living   3 3        2      Review of Systems  Constitutional: Negative for fever.  Respiratory: Negative for  shortness of breath.   Cardiovascular: Positive for chest pain.  Gastrointestinal: Negative for vomiting.  Musculoskeletal: Positive for neck pain.  Neurological: Positive for headaches. Negative for weakness.       Mild daily headache   All other systems reviewed and are negative.     Allergies  Cyclobenzaprine and Statins  Home Medications   Prior to Admission medications   Medication Sig Start Date End Date Taking? Authorizing Provider  Aspirin-Salicylamide-Caffeine (BC HEADACHE POWDER PO) Take 1 packet by mouth every 6 (six) hours as needed (pain).    Historical Provider, MD  chlorthalidone (HYGROTON) 25 MG tablet TAKE ONE TABLET BY MOUTH ONCE DAILY 08/04/14   Timmothy Euler, MD  Cholecalciferol (VITAMIN D) 2000 UNITS tablet Take 2,000 Units by mouth daily.    Historical Provider, MD  clopidogrel (PLAVIX) 75 MG tablet Take 1 tablet (75 mg total)  by mouth every morning. 03/10/15   Elberta Leatherwood, MD  feeding supplement, ENSURE COMPLETE, (ENSURE COMPLETE) LIQD Take 237 mLs by mouth 2 (two) times daily between meals. 04/17/14   Timmothy Euler, MD  fenofibrate 160 MG tablet Take 1 tablet (160 mg total) by mouth daily. 07/28/13   Timmothy Euler, MD  fluconazole (DIFLUCAN) 150 MG tablet Take 1 tablet (150 mg total) by mouth once. 05/26/15   Anastasio Auerbach, MD  fluticasone (FLONASE) 50 MCG/ACT nasal spray Place 1 spray into both nostrils daily as needed for allergies or rhinitis (congestion). 08/10/14   Timmothy Euler, MD  gabapentin (NEURONTIN) 100 MG capsule Take 2 capsules (200 mg total) by mouth 3 (three) times daily. 05/28/15   Elberta Leatherwood, MD  Multiple Vitamin (MULTIVITAMIN WITH MINERALS) TABS tablet Take 1 tablet by mouth daily.    Historical Provider, MD  nystatin (MYCOSTATIN/NYSTOP) 100000 UNIT/GM POWD Apply 1 g topically 2 (two) times daily. 02/17/15   Elberta Leatherwood, MD  ondansetron (ZOFRAN ODT) 4 MG disintegrating tablet Take 1 tablet (4 mg total) by mouth every 4 (four) hours as needed for nausea or vomiting. 10/16/14   [name], MD  pantoprazole (PROTONIX) 20 MG tablet Take 1 tablet (20 mg total) by mouth daily. 08/10/14   Timmothy Euler, MD  Polyethyl Glycol-Propyl Glycol (SYSTANE OP) Place 1 drop into both eyes 2 (two) times daily.    Historical Provider, MD  sertraline (ZOLOFT) 100 MG tablet Take 1 tablet (100 mg total) by mouth 2 (two) times daily. 05/21/15   Elberta Leatherwood, MD  sodium chloride (MURO 128) 5 % ophthalmic ointment Place 1 application into both eyes 3 (three) times daily.    Historical Provider, MD  traMADol (ULTRAM) 50 MG tablet Take 2 tablets (100 mg total) by mouth 2 (two) times daily as needed for moderate pain. 03/10/15   Elberta Leatherwood, MD   BP 132/79 mmHg  Pulse 76  Temp(Src) 99.3 F (37.4 C) (Oral)  Resp 13  SpO2 99% Physical Exam CONSTITUTIONAL: Well developed/well nourished HEAD:  Normocephalic/atraumatic EYES: EOMI/PERRL ENMT: Mucous membranes moist NECK: supple no meningeal signs SPINE/BACK:entire spine nontender, cervical paraspinal tenderness CV: S1/S2 noted, no murmurs/rubs/gallops noted LUNGS: Lungs are clear to auscultation bilaterally, no apparent distress ABDOMEN: soft, nontender, no rebound or guarding, bowel sounds noted throughout abdomen GU:no cva tenderness NEURO: Pt is awake/alert/appropriate, moves all extremitiesx4.  No facial droop.   Equal power (5/5) with hand grip, wrist flex/extension, elbow flex/extension, and equal power with shoulder abduction/adduction.  No focal sensory deficit to light touch is noted in  either UE.   EXTREMITIES: pulses normal/equal, full ROM, pulses in equal in both wrists, no discoloration to left hand.   SKIN: warm, color normal PSYCH: no abnormalities of mood noted, alert and oriented to situation  ED Course  Procedures   For chest pain - very brief episodes, denies SOB on my evaluation She is well appearing I doubt ACS/PE/Dissection at this time given history/exam as pain less than 1 minute.    For neck pain/arm pain - suspect radiculopathy, as h/o neck pain and also had previous ACDF - at this time no focal neuro deficits.  Advised to call her neurosurgeon We discussed strict return precautions She ambulated without difficulty   Labs Review Labs Reviewed  BASIC METABOLIC PANEL - Abnormal; Notable for the following:    Sodium 134 (*)    Potassium 3.4 (*)    Chloride 98 (*)    Creatinine, Ser 1.03 (*)    GFR calc non Af Amer 52 (*)    All other components within normal limits  CBC - Abnormal; Notable for the following:    RBC 3.75 (*)    HCT 35.2 (*)    All other components within normal limits  Randolm Idol, ED    Imaging Review Dg Chest 2 View  06/08/2015  CLINICAL DATA:  Chest pain. EXAM: CHEST  2 VIEW COMPARISON:  12/02/2014 FINDINGS: Chronic hyperinflation and bronchitic markings. There is  no edema, consolidation, effusion, or pneumothorax. Normal heart size and stable aortic tortuosity. Lower cervical ACDF with plate.  No acute osseous finding. IMPRESSION: No evidence of acute cardiopulmonary disease. Suspect COPD. Electronically Signed   By: Monte Fantasia M.D.   On: 06/08/2015 18:37   I have personally reviewed and evaluated these images and lab results as part of my medical decision-making.   EKG Interpretation   Date/Time:  Tuesday June 08 2015 17:07:20 EST Ventricular Rate:  83 PR Interval:  147 QRS Duration: 95 QT Interval:  393 QTC Calculation: 462 R Axis:   61 Text Interpretation:  Sinus rhythm Normal ECG No significant change since  last tracing Confirmed by Christy Gentles  MD, Elenore Rota ([id number]) on 06/08/2015  5:11:38 PM      MDM   Final diagnoses:  Chest pain, unspecified chest pain type  Cervical radiculopathy    Nursing notes including past medical history and social history reviewed and considered in documentation xrays/imaging reviewed by myself and considered during evaluation Labs/vital reviewed myself and considered during evaluation Previous records reviewed and considered - PCP notes reviewed     Ripley Fraise, MD 06/08/15 2028

## 2015-06-11 ENCOUNTER — Other Ambulatory Visit: Payer: Self-pay | Admitting: Family Medicine

## 2015-06-11 NOTE — Telephone Encounter (Signed)
Daughter is calling for her mother to get a refill on her Tramadol. Please let her know when they can pick this up. jw

## 2015-06-14 NOTE — Telephone Encounter (Signed)
Daughter is calling back to check the status of her Mother's Tramadol request. [name] [name]

## 2015-06-15 NOTE — Telephone Encounter (Signed)
Daughter called back on status of tramadol.  She can picck it up today

## 2015-06-17 MED ORDER — TRAMADOL HCL 50 MG PO TABS: 100.0000 mg | ORAL_TABLET | Freq: Two times a day (BID) | ORAL | Status: DC | PRN

## 2015-06-17 NOTE — Telephone Encounter (Signed)
Refill left up front.

## 2015-06-18 NOTE — Telephone Encounter (Signed)
Patient daughter informed.

## 2015-06-30 IMAGING — CR DG CHEST 2V
2 series · 2 of 2 positions shown · non-contrast
Comparison: October 19, 2014

CLINICAL DATA: Shortness of breath with exertion.  Hypertension.

EXAM:
CHEST  2 VIEW

[view not recorded (1 of 2)]
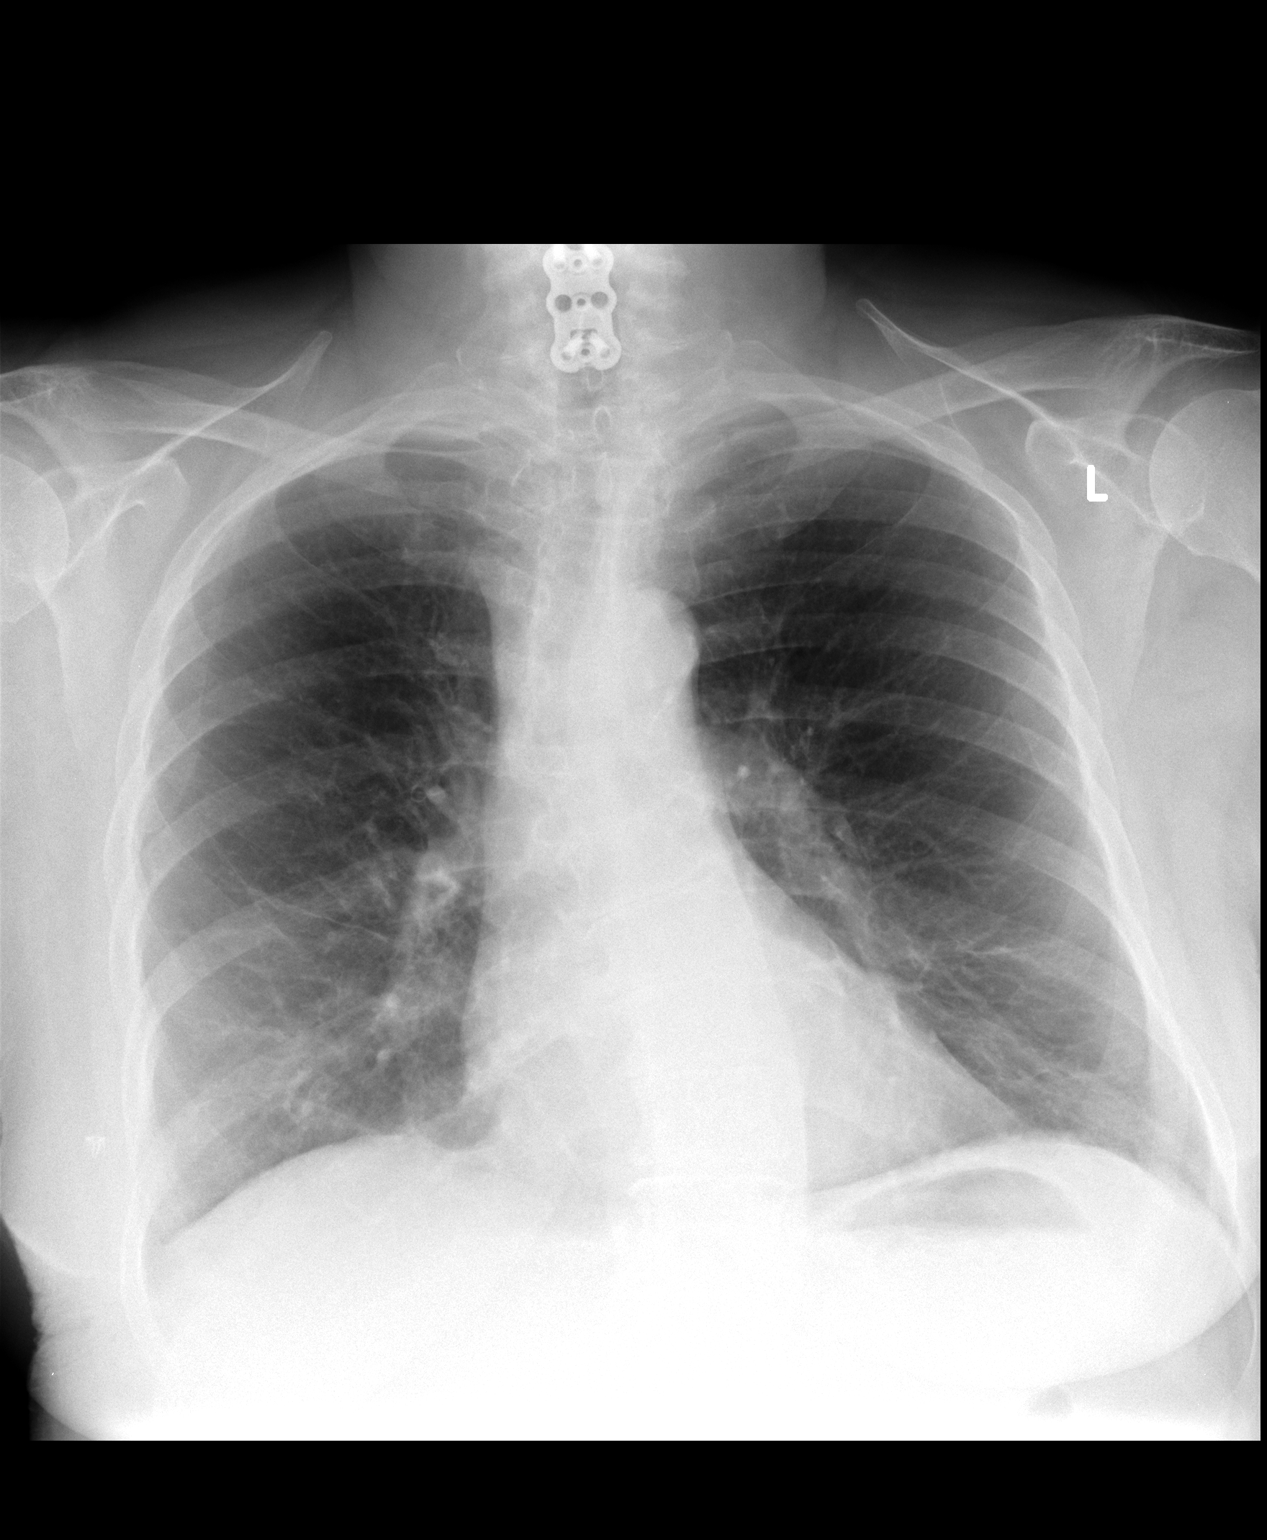

[view not recorded (2 of 2)]
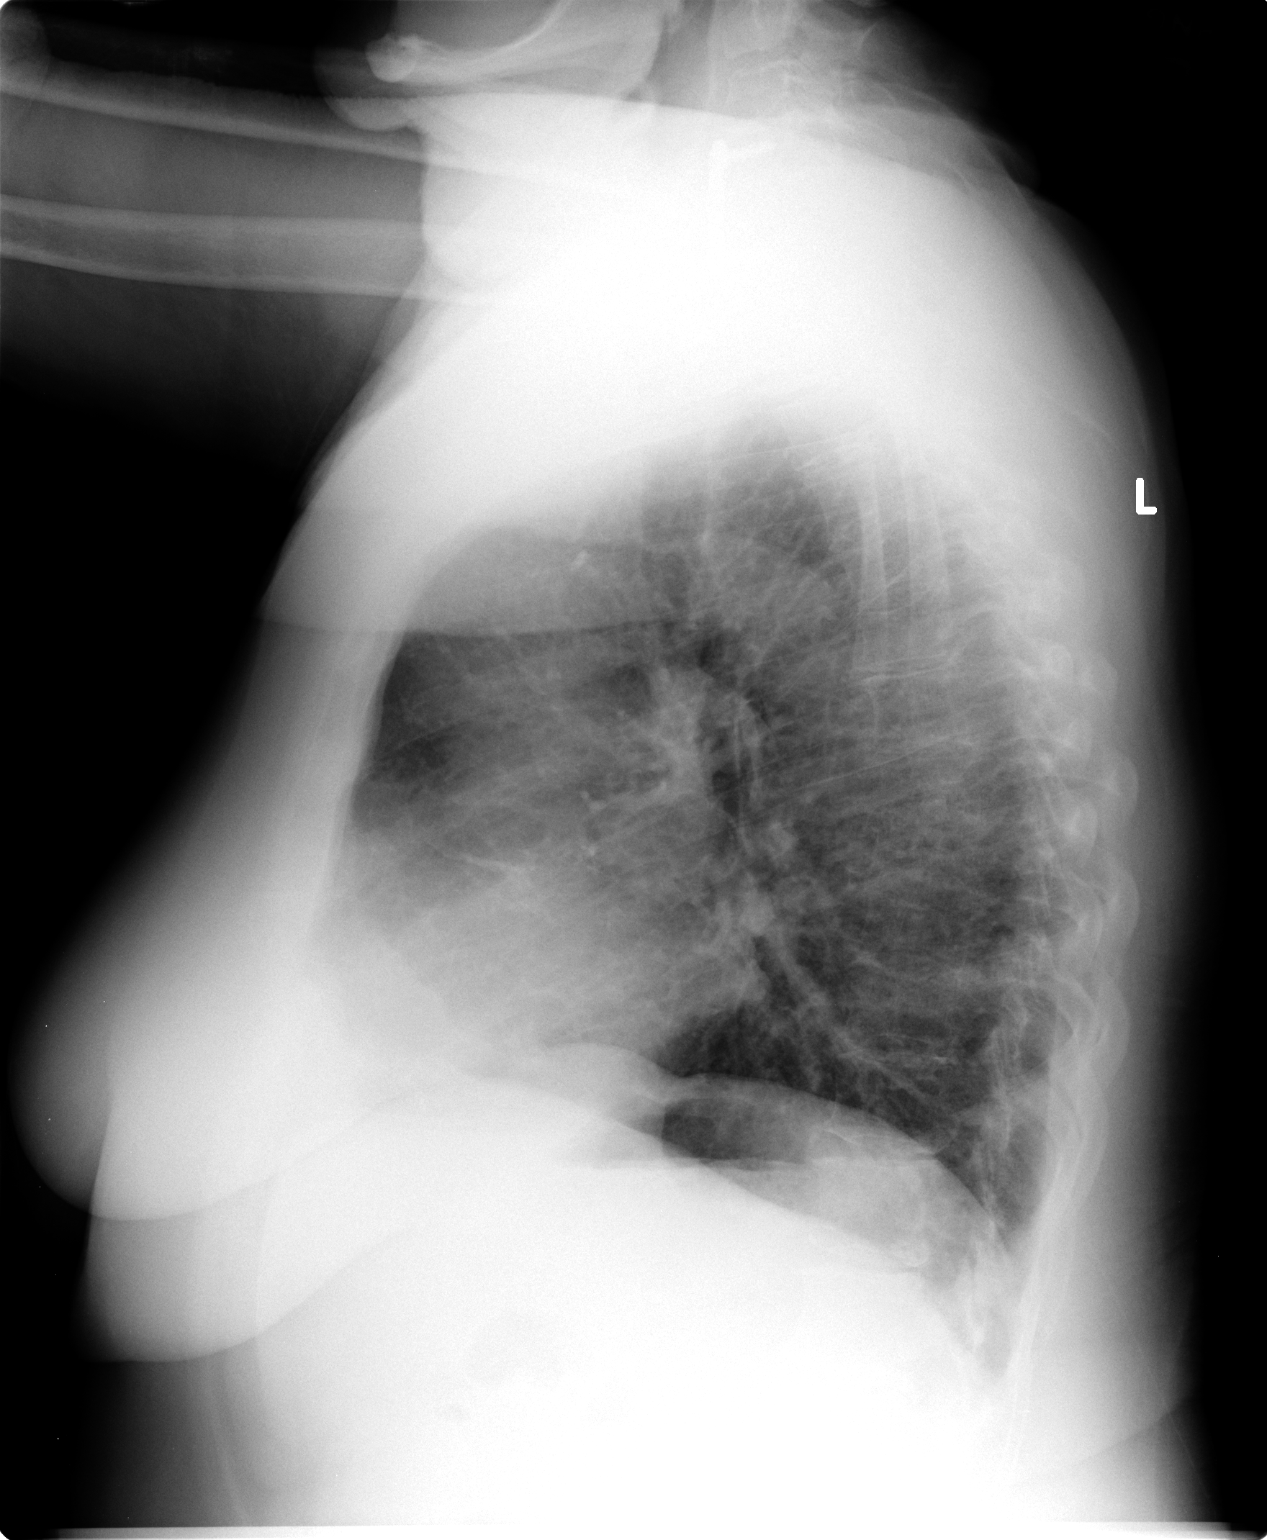

[2 of 2 positions shown; findings below may reference images not displayed]

FINDINGS: There is minimal subsegmental atelectasis in the lower lobes
bilaterally. Lungs elsewhere clear. Heart size and pulmonary
vascularity are normal. No adenopathy. There is mid and lower
thoracic levoscoliosis. There is postoperative change in the lower
cervical spine.
IMPRESSION: Slight subsegmental atelectasis in the bases. No edema or
consolidation.

## 2015-07-16 DIAGNOSIS — Z87440 Personal history of urinary (tract) infections: Secondary | ICD-10-CM | POA: Diagnosis not present

## 2015-07-16 DIAGNOSIS — R102 Pelvic and perineal pain: Secondary | ICD-10-CM | POA: Diagnosis not present

## 2015-07-16 DIAGNOSIS — Z0000 Encounter for general adult medical examination without abnormal findings: Secondary | ICD-10-CM | POA: Diagnosis not present

## 2015-07-22 DIAGNOSIS — H401213 Low-tension glaucoma, right eye, severe stage: Secondary | ICD-10-CM | POA: Diagnosis not present

## 2015-07-22 DIAGNOSIS — H401223 Low-tension glaucoma, left eye, severe stage: Secondary | ICD-10-CM | POA: Diagnosis not present

## 2015-07-22 DIAGNOSIS — H401333 Pigmentary glaucoma, bilateral, severe stage: Secondary | ICD-10-CM | POA: Diagnosis not present

## 2015-07-22 DIAGNOSIS — H40052 Ocular hypertension, left eye: Secondary | ICD-10-CM | POA: Diagnosis not present

## 2015-07-29 ENCOUNTER — Other Ambulatory Visit: Payer: Self-pay

## 2015-07-29 DIAGNOSIS — Z1231 Encounter for screening mammogram for malignant neoplasm of breast: Secondary | ICD-10-CM

## 2015-07-30 ENCOUNTER — Ambulatory Visit: Payer: PPO | Admitting: Family Medicine

## 2015-08-03 ENCOUNTER — Encounter: Payer: Self-pay | Admitting: Family Medicine

## 2015-08-03 ENCOUNTER — Ambulatory Visit (INDEPENDENT_AMBULATORY_CARE_PROVIDER_SITE_OTHER): Payer: PPO | Admitting: Family Medicine

## 2015-08-03 VITALS — BP 132/94 | HR 94 | Temp 98.5°F | Wt 158.0 lb

## 2015-08-03 DIAGNOSIS — E785 Hyperlipidemia, unspecified: Secondary | ICD-10-CM

## 2015-08-03 DIAGNOSIS — L918 Other hypertrophic disorders of the skin: Secondary | ICD-10-CM | POA: Diagnosis not present

## 2015-08-03 DIAGNOSIS — I739 Peripheral vascular disease, unspecified: Secondary | ICD-10-CM

## 2015-08-03 LAB — LIPID PANEL
Cholesterol: 231 mg/dL — ABNORMAL HIGH (ref 125–200)
HDL: 46 mg/dL (ref >=46)
LDL Cholesterol: 152 mg/dL — ABNORMAL HIGH (ref <130)
Total CHOL/HDL Ratio: 5 Ratio (ref <=5.0)
Triglycerides: 166 mg/dL — ABNORMAL HIGH (ref <150)
VLDL: 33 mg/dL — ABNORMAL HIGH (ref <30)

## 2015-08-03 NOTE — Patient Instructions (Signed)
It was a pleasure seeing you today in our clinic. Today we discussed your medications and skin tags. Here is the treatment plan we have discussed and agreed upon together:   - Today I would like to get a lipid panel. This is to check for your cholesterol is you've not have this checked in over 2 years. - Continue taking all of your medications as prescribed. - If you like to have these skin tags removed in our office please schedule an appointment for this to be performed. - if you ever change her mind about the possibility of restarting your CPap at night then please make another appointment with me to discuss this and we will get this set up.

## 2015-08-03 NOTE — Assessment & Plan Note (Signed)
Patient states that she has not been taking her fenofibrate medication, b/c she was told to "take a break" from its use for a period of time. She is now asking if she can begin taking it once more. - I've asked patient to begin taking this medication once more. I cannot find in her chart where/why she was told to hold this medication. - I have also ordered a lipid panel to be performed today. - I will continue to monitor.

## 2015-08-03 NOTE — Assessment & Plan Note (Signed)
Patient has a history of skin tags noted under bilateral axilla. No evidence of inflamed process at this time. No significant pain at this region. Patient is asking at this time about removal of these lesions, but does not feel as though this is anything urgent. - I discussed with her that a separate appointment to remove these lesions could be arranged. Patient stated her understanding.

## 2015-08-03 NOTE — Progress Notes (Signed)
   HPI  CC: medication follow-up Patient is here for medication follow-up. She states that she has not been taking her fenofibrate however she  Was wondering if she should restart. She denies any signs or symptoms suggestive of side effects on this medication but she was told to "take a break" from this medication and feels as though she should go back to taking it .  Skin tags: Patient also elected to discuss her axillary skin tags. She states that there bothersome at times and was hoping I would be willing to remove them. She denies any redness or pain in the area of the skin tags, however they are just very bothersome for her.eventually, patient states that she recently lost 2 non-blood related family members to melanoma and she wanted to make sure that these skin lesions were not concerning.  ROS: patient denies any fevers, chills, headache, confusion, blurred vision, chest pain, shortness of breath, muscle aches, abdominal pain, dysuria, weakness, or numbness.  Past medical history and social history reviewed and updated in the EMR as appropriate.  Objective: BP 132/94 mmHg  Pulse 94  Temp(Src) 98.5 F (36.9 C) (Oral)  Wt 158 lb (71.668 kg) Gen: NAD, alert, cooperative, and pleasant. CV: RRR, no murmur Resp: CTAB, no wheezes, non-labored Ext: No edema, warm, multiple small flesh-colored skin lesions present at bilateral axilla. No surrounding erythema or edema. No other remarkable lesions appreciated on this exam.   Assessment and plan:  Hyperlipemia Patient states that she has not been taking her fenofibrate medication, b/c she was told to "take a break" from its use for a period of time. She is now asking if she can begin taking it once more. - I've asked patient to begin taking this medication once more. I cannot find in her chart where/why she was told to hold this medication. - I have also ordered a lipid panel to be performed today. - I will continue to  monitor.  Acrochordon Patient has a history of skin tags noted under bilateral axilla. No evidence of inflamed process at this time. No significant pain at this region. Patient is asking at this time about removal of these lesions, but does not feel as though this is anything urgent. - I discussed with her that a separate appointment to remove these lesions could be arranged. Patient stated her understanding.    Orders Placed This Encounter  Procedures  . Lipid panel     Elberta Leatherwood, MD,MS,  PGY2 08/03/2015 4:24 PM

## 2015-08-04 ENCOUNTER — Encounter: Payer: Self-pay | Admitting: Family Medicine

## 2015-08-15 ENCOUNTER — Other Ambulatory Visit: Payer: Self-pay | Admitting: Family Medicine

## 2015-08-16 ENCOUNTER — Other Ambulatory Visit: Payer: Self-pay | Admitting: Family Medicine

## 2015-08-16 DIAGNOSIS — K219 Gastro-esophageal reflux disease without esophagitis: Secondary | ICD-10-CM

## 2015-08-16 MED ORDER — CHLORTHALIDONE 25 MG PO TABS: 25.0000 mg | ORAL_TABLET | Freq: Every day | ORAL | Status: DC

## 2015-08-16 MED ORDER — PANTOPRAZOLE SODIUM 20 MG PO TBEC: 20.0000 mg | DELAYED_RELEASE_TABLET | Freq: Every day | ORAL | Status: DC

## 2015-08-16 NOTE — Telephone Encounter (Signed)
Pt called and and needs refills on her Protonix, and [place]. The pharmacy called this in yesterday but they sent it to the wrong doctor. jw

## 2015-08-17 DIAGNOSIS — M542 Cervicalgia: Secondary | ICD-10-CM | POA: Diagnosis not present

## 2015-08-17 DIAGNOSIS — M5412 Radiculopathy, cervical region: Secondary | ICD-10-CM | POA: Diagnosis not present

## 2015-08-29 DIAGNOSIS — N390 Urinary tract infection, site not specified: Secondary | ICD-10-CM | POA: Diagnosis not present

## 2015-09-02 ENCOUNTER — Telehealth: Payer: Self-pay | Admitting: Family Medicine

## 2015-09-02 ENCOUNTER — Other Ambulatory Visit: Payer: Self-pay | Admitting: Family Medicine

## 2015-09-02 DIAGNOSIS — R002 Palpitations: Secondary | ICD-10-CM

## 2015-09-02 DIAGNOSIS — R197 Diarrhea, unspecified: Secondary | ICD-10-CM | POA: Diagnosis not present

## 2015-09-02 NOTE — Telephone Encounter (Signed)
Done

## 2015-09-02 NOTE — Telephone Encounter (Signed)
Needs referral to cardiologist.  [name] [name] on [facility]. Dr Linward Natal ?[name] Pt was takent to Urgent Care because pt felt her heart was fluttering. Urgent Care recommended pt seeing cardiologist

## 2015-09-06 ENCOUNTER — Ambulatory Visit
Admission: RE | Admit: 2015-09-06 | Discharge: 2015-09-06 | Disposition: A | Discharge status: Home / Self Care | Payer: PPO | Source: Ambulatory Visit

## 2015-09-06 DIAGNOSIS — Z1231 Encounter for screening mammogram for malignant neoplasm of breast: Secondary | ICD-10-CM | POA: Diagnosis not present

## 2015-09-07 DIAGNOSIS — L570 Actinic keratosis: Secondary | ICD-10-CM | POA: Diagnosis not present

## 2015-09-07 DIAGNOSIS — L820 Inflamed seborrheic keratosis: Secondary | ICD-10-CM | POA: Diagnosis not present

## 2015-09-07 DIAGNOSIS — N390 Urinary tract infection, site not specified: Secondary | ICD-10-CM | POA: Diagnosis not present

## 2015-09-07 DIAGNOSIS — Z0000 Encounter for general adult medical examination without abnormal findings: Secondary | ICD-10-CM | POA: Diagnosis not present

## 2015-09-07 DIAGNOSIS — Z87440 Personal history of urinary (tract) infections: Secondary | ICD-10-CM | POA: Diagnosis not present

## 2015-09-07 DIAGNOSIS — L218 Other seborrheic dermatitis: Secondary | ICD-10-CM | POA: Diagnosis not present

## 2015-09-07 NOTE — Progress Notes (Signed)
Cardiology Office Note:    Date:  09/07/2015   ID:  [name] [name], DOB Mar 19, 1941, MRN [id number]  PCP:  Georges Lynch, MD  Cardiologist:  Dr. Jenkins Rouge   Electrophysiologist:  n/a  Chief Complaint  Patient presents with  . Palpitations    History of Present Illness:     [name] [name] is a 75 y.o. female with a hx of     Past Medical History  Diagnosis Date  . Hypertension   . Heart murmur   . Pneumonia   . Anxiety   . Depression   . Psoriasis   . Arthritis   . Hypercholesteremia   . Congenital absence of one kidney     Pt has right kidney only  . Carpal tunnel syndrome, bilateral   . Chronic kidney disease     CKD stage III, absence of left kidney  . Peripheral vascular disease ([place])     Dr. Benjamine Sprague; right external iliac stent '06 with angioplasty '13  . Polymyalgia rheumatica (HCC)     Past Surgical History  Procedure Laterality Date  . Appendectomy    . Tonsillectomy    . Eye surgery    . Iliac artery stent    . Anterior cervical decomp/discectomy fusion  04/19/2012    Procedure: ANTERIOR CERVICAL DECOMPRESSION/DISCECTOMY FUSION 2 LEVELS;  Surgeon: Winfield Cunas, MD;  Location: [facility];  Service: Neurosurgery;  Laterality: N/A;  Cervical four-five,Cervical five-six anterior cervical decompression with fusion plating and bonegraft possible posterior cervical decompression  . Carpal tunnel release  1990  . Cervical fusion  04/19/2012  . Abdominal hysterectomy  1963    Partial,  Due to bleeding after delivery    Current Medications: Outpatient Prescriptions Prior to Visit  Medication Sig Dispense Refill  . Aspirin-Salicylamide-Caffeine (BC HEADACHE POWDER PO) Take 1 packet by mouth every 6 (six) hours as needed (pain).    . chlorthalidone (HYGROTON) 25 MG tablet Take 1 tablet (25 mg total) by mouth daily. 30 tablet 11  . Cholecalciferol (VITAMIN D) 2000 UNITS tablet Take 2,000 Units by mouth daily.    . clopidogrel (PLAVIX) 75 MG tablet Take 1 tablet (75  mg total) by mouth every morning. 90 tablet 3  . fenofibrate 160 MG tablet Take 1 tablet (160 mg total) by mouth daily. 90 tablet 3  . fluticasone (FLONASE) 50 MCG/ACT nasal spray Place 1 spray into both nostrils daily as needed for allergies or rhinitis (congestion). 16 g 11  . gabapentin (NEURONTIN) 100 MG capsule Take 2 capsules (200 mg total) by mouth 3 (three) times daily. 180 capsule 5  . Multiple Vitamin (MULTIVITAMIN WITH MINERALS) TABS tablet Take 1 tablet by mouth daily.    . pantoprazole (PROTONIX) 20 MG tablet Take 1 tablet (20 mg total) by mouth daily. 90 tablet 3  . Polyethyl Glycol-Propyl Glycol (SYSTANE OP) Place 1 drop into both eyes 2 (two) times daily.    . sertraline (ZOLOFT) 100 MG tablet Take 1 tablet (100 mg total) by mouth 2 (two) times daily. 60 tablet 11  . sodium chloride (MURO 128) 5 % ophthalmic ointment Place 1 application into both eyes 3 (three) times daily.    . traMADol (ULTRAM) 50 MG tablet Take 2 tablets (100 mg total) by mouth 2 (two) times daily as needed for moderate pain. 60 tablet 2   No facility-administered medications prior to visit.     Allergies:   Cyclobenzaprine and Statins   Social History   Social History  .  Marital Status: Widowed    Spouse Name: N/A  . Number of Children: N/A  . Years of Education: N/A   Social History Main Topics  . Smoking status: Former Smoker -- 1.00 packs/day for 40 years    Types: Cigarettes    Quit date: 07/24/2010  . Smokeless tobacco: Never Used     Comment: STARTED BACK JULY 2013 AND RECENTLY QUIT 03-10-2012  . Alcohol Use: No     Comment: QUIT IN 1990   . Drug Use: No  . Sexual Activity: No     Comment: 1st intercourse 75 yo-Fewer than 5 partners   Other Topics Concern  . Not on file   Social History Narrative     Family History:  The patient's family history includes Angina in her mother; Cancer in her sister; Heart attack in her father; Melanoma in her brother.   ROS:   Please see the history  of present illness.    ROS All other systems reviewed and are negative.   Physical Exam:    VS:  There were no vitals taken for this visit.   GEN: Well nourished, well developed, in no acute distress HEENT: normal Neck: no JVD, no masses Cardiac: Normal S1/S2, RRR; no murmurs, rubs, or gallops, no edema;   carotid bruits,   Respiratory:  clear to auscultation bilaterally; no wheezing, rhonchi or rales GI: soft, nontender, nondistended, + BS MS: no deformity or atrophy Skin: warm and dry, no rash Neuro:  Bilateral strength equal, no focal deficits  Psych: Alert and oriented x 3, normal affect  Wt Readings from Last 3 Encounters:  08/03/15 158 lb (71.668 kg)  06/08/15 149 lb 9.6 oz (67.858 kg)  05/13/15 150 lb (68.04 kg)      Studies/Labs Reviewed:     EKG:  EKG is  ordered today.  The ekg ordered today demonstrates   Recent Labs: 10/19/2014: ALT 12 10/26/2014: Magnesium 1.5 06/08/2015: BUN 14; Creatinine, Ser 1.03*; Hemoglobin 12.4; Platelets 153; Potassium 3.4*; Sodium 134*   Recent Lipid Panel    Component Value Date/Time   CHOL 231* 08/03/2015 1148   TRIG 166* 08/03/2015 1148   HDL 46 08/03/2015 1148   CHOLHDL 5.0 08/03/2015 1148   VLDL 33* 08/03/2015 1148   LDLCALC 152* 08/03/2015 1148    Additional studies/ records that were reviewed today include:    LE Arterial Duplex 7/16 No evidence of segmental lower extremity arterial disease at rest, bilaterally. ABI's are within normal range, bilaterally, s/p right stent. Right great toe-brachial index in normal. Left great toe-brachial index is abnormal  Carotid US 3/15 No significant ICA stenosis (1-39%) noted  Echo 3/15 - Left ventricle: The cavity size was normal. Wall thickness was normal. Systolic function was normal. The estimated ejection fraction was in the range of 55% to 60%. Wall motion was normal; there were no regional wall motion abnormalities. Doppler parameters are consistent  with abnormal left ventricular relaxation (grade 1 diastolic dysfunction). The E/e' ratio is >10, suggesting elevated LV filling pressure. - Left atrium: The atrium was normal in size. - Inferior vena cava: The vessel was normal in size; the respirophasic diameter changes were in the normal range (= 50%); findings are consistent with normal central venous pressure. - Pericardium, extracardiac: There was no pericardial effusion.  Myoview 10/13 IMPRESSION: Cannot exclude a small focus of reversibility involving the distal inferolateral wall. Otherwise normal examination without focal wall motion abnormality. The calculated left ventricular ejection fraction is 72%.  ASSESSMENT:  No diagnosis found.  PLAN:     In order of problems listed above:  1.    Medication Adjustments/Labs and Tests Ordered: Current medicines are reviewed at length with the patient today.  Concerns regarding medicines are outlined above.  Medication changes, Labs and Tests ordered today are outlined in the Patient Instructions noted below. There are no Patient Instructions on file for this visit. Signed, Richardson Dopp, PA-C  09/07/2015 9:09 PM    [facility] [place], [place], [place]  [address] Phone: [phone]; Fax: [phone]     This encounter was created in error - please disregard.

## 2015-09-08 ENCOUNTER — Encounter: Payer: PPO | Admitting: Physician Assistant

## 2015-09-15 ENCOUNTER — Encounter: Payer: Self-pay | Admitting: Physician Assistant

## 2015-09-22 ENCOUNTER — Other Ambulatory Visit: Payer: Self-pay | Admitting: Family Medicine

## 2015-09-22 NOTE — Telephone Encounter (Signed)
Pt needs refills on tramadol and fenofibrate.  Pharmacy is [facility] on [address]. Please call pt when tramadol is ready for pickup

## 2015-09-24 DIAGNOSIS — Z0000 Encounter for general adult medical examination without abnormal findings: Secondary | ICD-10-CM | POA: Diagnosis not present

## 2015-09-24 DIAGNOSIS — R102 Pelvic and perineal pain: Secondary | ICD-10-CM | POA: Diagnosis not present

## 2015-09-27 NOTE — Telephone Encounter (Signed)
Scripts sent in via phone.

## 2015-09-29 ENCOUNTER — Encounter: Payer: Self-pay | Admitting: Physician Assistant

## 2015-10-18 ENCOUNTER — Ambulatory Visit (INDEPENDENT_AMBULATORY_CARE_PROVIDER_SITE_OTHER): Payer: PPO | Admitting: Family Medicine

## 2015-10-18 ENCOUNTER — Encounter: Payer: Self-pay | Admitting: Family Medicine

## 2015-10-18 VITALS — BP 142/71 | HR 97 | Temp 97.8°F | Ht 60.0 in | Wt 146.2 lb

## 2015-10-18 DIAGNOSIS — M25561 Pain in right knee: Secondary | ICD-10-CM | POA: Diagnosis not present

## 2015-10-18 DIAGNOSIS — R5383 Other fatigue: Secondary | ICD-10-CM | POA: Diagnosis not present

## 2015-10-18 DIAGNOSIS — R531 Weakness: Secondary | ICD-10-CM | POA: Insufficient documentation

## 2015-10-18 DIAGNOSIS — R627 Adult failure to thrive: Secondary | ICD-10-CM

## 2015-10-18 LAB — CBC
HCT: 38.6 % (ref 35.0–45.0)
Hemoglobin: 13.3 g/dL (ref 11.7–15.5)
MCH: 33.4 pg — ABNORMAL HIGH (ref 27.0–33.0)
MCHC: 34.5 g/dL (ref 32.0–36.0)
MCV: 97 fL (ref 80.0–100.0)
MPV: 10 fL (ref 7.5–12.5)
Platelets: 239 10*3/uL (ref 140–400)
RBC: 3.98 MIL/uL (ref 3.80–5.10)
RDW: 13.7 % (ref 11.0–15.0)
WBC: 9.1 10*3/uL (ref 3.8–10.8)

## 2015-10-18 LAB — COMPLETE METABOLIC PANEL WITH GFR
ALT: 10 U/L (ref 6–29)
AST: 22 U/L (ref 10–35)
Albumin: 4.3 g/dL (ref 3.6–5.1)
Alkaline Phosphatase: 61 U/L (ref 33–130)
BUN: 16 mg/dL (ref 7–25)
CO2: 27 mmol/L (ref 20–31)
Calcium: 9.4 mg/dL (ref 8.6–10.4)
Chloride: 97 mmol/L — ABNORMAL LOW (ref 98–110)
Creat: 1.24 mg/dL — ABNORMAL HIGH (ref 0.60–0.93)
GFR, Est African American: 49 mL/min — ABNORMAL LOW (ref >=60)
GFR, Est Non African American: 42 mL/min — ABNORMAL LOW (ref >=60)
Glucose, Bld: 88 mg/dL (ref 65–99)
Potassium: 3.4 mmol/L — ABNORMAL LOW (ref 3.5–5.3)
Sodium: 138 mmol/L (ref 135–146)
Total Bilirubin: 0.4 mg/dL (ref 0.2–1.2)
Total Protein: 6.9 g/dL (ref 6.1–8.1)

## 2015-10-18 LAB — TSH: TSH: 0.88 mIU/L

## 2015-10-18 LAB — POCT SEDIMENTATION RATE: POCT SED RATE: 20 mm/hr (ref 0–22)

## 2015-10-18 MED ORDER — METHYLPREDNISOLONE ACETATE 40 MG/ML IJ SUSP
40.0000 mg | Freq: Once | INTRAMUSCULAR | Status: AC
  Administered 2015-10-18: 40 mg via INTRA_ARTICULAR

## 2015-10-18 NOTE — Patient Instructions (Signed)
It was a pleasure seeing you today in our clinic. Today we discussed your fatigue. Here is the treatment plan we have discussed and agreed upon together:   - I've ordered some labs to be drawn. - I would like to see you back in one week or less. At that visit please bring all of your medication bottles as I would like to go over them with you.

## 2015-10-18 NOTE — Assessment & Plan Note (Signed)
>>  ASSESSMENT AND PLAN FOR GENERALIZED WEAKNESS WRITTEN ON 10/18/2015  7:08 PM BY MCKEAG, IAN D, MD  Patient complaining of significant fatigue over the past 3 weeks. Etiology currently unknown. Differential includes psychosomatic/depression, metabolic/electrolyte dysfunction, endocrine dysfunction, anemia, medication induced, or infectious. Patient endorses taking some over-the-counter medications recently which include Benadryl . Also, patient does take Zoloft  however she is no longer taking gabapentin . However, a depressive cause is currently the most likely as patient informed me at the end of our visit that her uncle and brother have both passed away recently. Her uncle being the most recent passed away approximately one month ago which is along the same timeline as her symptom onset. - Labs ordered today: CBC/CMP/TSH/ESR - PHQ9 obtained today: Score of 13 - Follow-up in one week to go over labs and discuss symptoms. - I have asked patient to hold all over-the-counter medications.

## 2015-10-18 NOTE — Assessment & Plan Note (Signed)
Patient complaining of significant fatigue over the past 3 weeks. Etiology currently unknown. Differential includes psychosomatic/depression, metabolic/electrolyte dysfunction, endocrine dysfunction, anemia, medication induced, or infectious. Patient endorses taking some over-the-counter medications recently which include Benadryl. Also, patient does take Zoloft however she is no longer taking gabapentin. However, a depressive cause is currently the most likely as patient informed me at the end of our visit that her uncle and brother have both passed away recently. Her uncle being the most recent passed away approximately one month ago which is along the same timeline as her symptom onset. - Labs ordered today: CBC/CMP/TSH/ESR - PHQ9 obtained today: Score of 13 - Follow-up in one week to go over labs and discuss symptoms. - I have asked patient to hold all over-the-counter medications.

## 2015-10-18 NOTE — Assessment & Plan Note (Signed)
Osteoarthritis of the right knee. - Corticosteroid injection performed today clinic.

## 2015-10-18 NOTE — Progress Notes (Signed)
FATIGUE Patient describes overwhelming fatigue over the past 3 weeks. She has been sleeping all day with only seen awake for approximately 4 hours each day. Appetite is significantly diminished.  Patient complains of fatigue for the last 20-25 days. The Tiredness is all day. It is best described as constant.  Symptoms Fever: No Sweating at night: No Weight Loss: No Shortness of Breath: Occasionally Coughing up Blood: No Muscle Pain or Weakness: No Black or bloody Stools: No Severe Snoring or Daytime Sleepiness: No Feeling Down: Yes as patient's brother and uncle have both passed away recently Not enjoying things: Occasionally Rash: Yes, as described from previous visit Leg or Joint Swelling: No Chest Pain or irregular heart beat:  No  ROS - Please see HPI Smoking Status Noted  CC, SH/smoking status, and VS noted  Objective: BP 142/71 mmHg  Pulse 97  Temp(Src) 97.8 F (36.6 C) (Oral)  Ht 5' (1.524 m)  Wt 146 lb 3.2 oz (66.316 kg)  BMI 28.55 kg/m2 Gen: NAD, alert, cooperative, and pleasant. HEENT: NCAT, EOMI, PERRL, conjunctiva normal, no JVD, no lymphadenopathy CV: RRR Resp: CTAB Ext: No edema, warm, crepitus noted with range of motion of the left knee. Significant medial and lateral joint line tenderness present. No effusion or erythema present. All 4 ligaments intact. Neuro: Alert and oriented, Speech clear, No gross deficits, memory and concentration significantly diminished.  (Performed by Dr. Harrie Jeans) INJECTION: Left knee Patient was given informed consent, signed copy in the chart. Appropriate time out was taken. Area prepped and draped in my usual sterile fashion. 1 cc of methylprednisolone 40 mg/ml plus  3 cc of 1% lidocaine without epinephrine was injected into the left knee using a(n) anteriolateral and inferior approach. The patient tolerated the procedure well. There were no complications. Post procedure instructions were given.   Assessment and plan:  Other  fatigue Patient complaining of significant fatigue over the past 3 weeks. Etiology currently unknown. Differential includes psychosomatic/depression, metabolic/electrolyte dysfunction, endocrine dysfunction, anemia, medication induced, or infectious. Patient endorses taking some over-the-counter medications recently which include Benadryl. Also, patient does take Zoloft however she is no longer taking gabapentin. However, a depressive cause is currently the most likely as patient informed me at the end of our visit that her uncle and brother have both passed away recently. Her uncle being the most recent passed away approximately one month ago which is along the same timeline as her symptom onset. - Labs ordered today: CBC/CMP/TSH/ESR - PHQ9 obtained today: Score of 13 - Follow-up in one week to go over labs and discuss symptoms. - I have asked patient to hold all over-the-counter medications.  Right knee pain Osteoarthritis of the right knee. - Corticosteroid injection performed today clinic.    Orders Placed This Encounter  Procedures  . CBC  . COMPLETE METABOLIC PANEL WITH GFR  . TSH  . POCT SEDIMENTATION RATE    Meds ordered this encounter  Medications  . methylPREDNISolone acetate (DEPO-MEDROL) 40 MG/ML injection    Sig: once.  . methylPREDNISolone acetate (DEPO-MEDROL) injection 40 mg    Sig:      Elberta Leatherwood, MD,MS,  PGY2 10/18/2015 7:09 PM

## 2015-10-19 DIAGNOSIS — L570 Actinic keratosis: Secondary | ICD-10-CM | POA: Diagnosis not present

## 2015-10-19 DIAGNOSIS — L300 Nummular dermatitis: Secondary | ICD-10-CM | POA: Diagnosis not present

## 2015-10-26 ENCOUNTER — Ambulatory Visit (INDEPENDENT_AMBULATORY_CARE_PROVIDER_SITE_OTHER): Payer: PPO | Admitting: Family Medicine

## 2015-10-26 ENCOUNTER — Encounter: Payer: Self-pay | Admitting: Family Medicine

## 2015-10-26 VITALS — BP 148/84 | HR 108 | Temp 97.8°F | Wt 146.0 lb

## 2015-10-26 DIAGNOSIS — I10 Essential (primary) hypertension: Secondary | ICD-10-CM | POA: Diagnosis not present

## 2015-10-26 DIAGNOSIS — M79604 Pain in right leg: Secondary | ICD-10-CM

## 2015-10-26 DIAGNOSIS — R5383 Other fatigue: Secondary | ICD-10-CM | POA: Diagnosis not present

## 2015-10-26 MED ORDER — METOPROLOL TARTRATE 25 MG PO TABS: 12.5000 mg | ORAL_TABLET | Freq: Two times a day (BID) | ORAL | Status: DC
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
## 2015-10-26 NOTE — Patient Instructions (Signed)
It was a pleasure seeing you today in our clinic. Today we discussed your fatigue and weakness. Here is the treatment plan we have discussed and agreed upon together:   - As discussed in the clinic I have placed a referral for physical therapy. You will be contacted to make an appointment later this week. - During our clinic hours I provided you with the business card of our clinic psychologist, Dr. Gwenlyn Saran. I would like for you to call her office to schedule an appointment with her. You will likely need to leave her a voicemail with your contact information. - I have placed an order for a new medication.Take metoprolol, one half tablet twice a day.
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
## 2015-10-26 NOTE — Assessment & Plan Note (Signed)
Worsening: Patient hypertensive to 148/84 with a heart rate of 108.We discussed the benefit of avoiding her headache powders as well as caffeine. Patient also has a resting tremor - Will initiate beta blocker today as this may help with both her blood pressure and tremor.
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
## 2015-10-29 ENCOUNTER — Other Ambulatory Visit: Payer: Self-pay | Admitting: Family Medicine

## 2015-10-29 NOTE — Telephone Encounter (Signed)
Pt is calling for a refill on her Tramadol to be called in. jw

## 2015-10-31 MED ORDER — TRAMADOL HCL 50 MG PO TABS: 100.0000 mg | ORAL_TABLET | Freq: Two times a day (BID) | ORAL | Status: DC | PRN

## 2015-11-01 ENCOUNTER — Telehealth: Payer: Self-pay | Admitting: Family Medicine

## 2015-11-01 ENCOUNTER — Telehealth: Payer: Self-pay | Admitting: Psychology

## 2015-11-01 NOTE — Telephone Encounter (Signed)
Patient left a VM requesting an appointment.  See Dr. Eyvonne Mechanic last note.  Used to see a Teacher, music in [employer] at [place] but her psychiatrist moved to [place].  On Zoloft 200 mg a day and does not think it is working well.  Also wants someone to talk to someone.  Has had a therapist previously.  Has had several deaths recently and a bout with c. Diff that left her deconditioned.    She prefers afternoons or late mornings.  Friday the 28th was a good match for her.  Scheduled her with Elton Sin, Behavioral Health Consultant.

## 2015-11-01 NOTE — Telephone Encounter (Signed)
Pt called to check the status of her request for Tramadol. It shows that the doctor filled this on 10/31/15 and hit print. It has not been put up front or called in. Can we call this in to [facility] for her. jw

## 2015-11-01 NOTE — Telephone Encounter (Signed)
Left message on pharmacy line at [facility] refill for Tramadol [id number] 2 tablets (100 mg total) by mouth 2 (two) times daily as needed for moderate pain; #60, 2 refills.  Derl Barrow, RN

## 2015-11-02 ENCOUNTER — Telehealth: Payer: Self-pay | Admitting: *Deleted

## 2015-11-02 ENCOUNTER — Telehealth: Payer: Self-pay | Admitting: Family Medicine

## 2015-11-02 NOTE — Telephone Encounter (Signed)
[facility] request to go to [facility] instead of [address].

## 2015-11-02 NOTE — Telephone Encounter (Signed)
Corrected

## 2015-11-02 NOTE — Telephone Encounter (Signed)
Great! Thank you Dr. Gwenlyn Saran.

## 2015-11-02 NOTE — Telephone Encounter (Signed)
[facility], they received the refill request however the provider did not leave name.  Provider name given and patient should be able to pick up Rx today.  Derl Barrow, RN

## 2015-11-02 NOTE — Telephone Encounter (Signed)
Pt is calling because the pharmacy stated that the doctor didn't sign the prescription for her Tramadol. Can we send another prescription over or call and correct this. jw

## 2015-11-04 ENCOUNTER — Ambulatory Visit (INDEPENDENT_AMBULATORY_CARE_PROVIDER_SITE_OTHER): Payer: PPO | Admitting: Licensed Clinical Social Worker

## 2015-11-04 DIAGNOSIS — F4321 Adjustment disorder with depressed mood: Secondary | ICD-10-CM

## 2015-11-04 NOTE — Progress Notes (Signed)
Patient ID: [name] [name], female   DOB: [birth year], 75 y.o.   MRN: [id number]   Dr. Gwenlyn Saran requested [facility] consult due to patient requesting someone to talk to after several deaths in her family.  Patient is pleasant and engaged in conversation. 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  States symptoms that bought her in started several weeks ago, she feels angry, tearful and has changes in her sleeping patterns.  "I want to get back on track".  Patient describes getting back on track being active and feeling happy. Patient was active prior to some health concerns shortly after two family members passed away.  She spent approximately two weeks in bed which caused some deconditioning.  Patient has a history of inpatient and outpatient mental health treatment over the past 40 years. Patient denies SI or HI.  Last outpatient therapist was in 2013, taking Zoloft since 2012 "Things have been going well for me until the past several weeks.    Depression screen PHQ 2/9 11/04/2015  Decreased Interest 1  Down, Depressed, Hopeless 2  PHQ - 2 Score 3  Altered sleeping 3  Tired, decreased energy 3  Change in appetite 3  Feeling bad or failure about yourself  1  Trouble concentrating 1  Moving slowly or fidgety/restless 1  Suicidal thoughts 0  PHQ-9 Score 15  Difficult doing work/chores Somewhat difficult   Patient is experiencing grief as a result of losing three family members within the past 6 months.  She is unsure of how to manage her feelings, and thoughts. Therapy will aid her by targeting dysfunctional thoughts.    Focus of session was to establish a therapeutic relationship, provide psychoeducation on grief and depression, identify presenting problem, and set goals.  Provided Behavioral Activation planning sheet and relaxed breathing provided for homework.    Goals: Patient wants to get back to moving and feeling  good, and work on controlling her emotions. Patient has agreed to do the following . start Physical Therapy next week . reduce her isolation by asking her [place] members to come visit her 3 times a week . Continue journaling  . Read her bible 3 times daily   Patient will call to schedule follow up with [place]. [place] [employer],  [phone] 5:04 PM

## 2015-11-05 ENCOUNTER — Ambulatory Visit: Payer: PPO

## 2015-11-09 ENCOUNTER — Encounter: Payer: Self-pay | Admitting: Physical Therapy

## 2015-11-09 ENCOUNTER — Ambulatory Visit: Payer: PPO | Attending: Family Medicine | Admitting: Physical Therapy

## 2015-11-09 DIAGNOSIS — M79604 Pain in right leg: Secondary | ICD-10-CM | POA: Diagnosis not present

## 2015-11-09 DIAGNOSIS — M6281 Muscle weakness (generalized): Secondary | ICD-10-CM

## 2015-11-09 NOTE — Therapy (Signed)
[place], [place], [address] Phone: [phone]   Fax:  [phone]  Physical Therapy Evaluation  Patient Details  Name: [name] [name] MRN: [id number] Date of Birth: 1941-02-26 Referring Provider: Andria Frames  Encounter Date: 11/09/2015      PT End of Session - 11/09/15 1553    Visit Number 1   Date for PT Re-Evaluation 01/09/16   PT Start Time 1526   PT Stop Time 1616   PT Time Calculation (min) 50 min   Activity Tolerance Patient tolerated treatment well   Behavior During Therapy [facility] for tasks assessed/performed      Past Medical History  Diagnosis Date  . Hypertension   . Heart murmur   . Pneumonia   . Anxiety   . Depression   . Psoriasis   . Arthritis   . Hypercholesteremia   . Congenital absence of one kidney     Pt has right kidney only  . Carpal tunnel syndrome, bilateral   . Chronic kidney disease     CKD stage III, absence of left kidney  . Peripheral vascular disease ([place])     Dr. Benjamine Sprague; right external iliac stent '06 with angioplasty '13  . Polymyalgia rheumatica (HCC)     Past Surgical History  Procedure Laterality Date  . Appendectomy    . Tonsillectomy    . Eye surgery    . Iliac artery stent    . Anterior cervical decomp/discectomy fusion  04/19/2012    Procedure: ANTERIOR CERVICAL DECOMPRESSION/DISCECTOMY FUSION 2 LEVELS;  Surgeon: Winfield Cunas, MD;  Location: [place] [facility];  Service: Neurosurgery;  Laterality: N/A;  Cervical four-five,Cervical five-six anterior cervical decompression with fusion plating and bonegraft possible posterior cervical decompression  . Carpal tunnel release  1990  . Cervical fusion  04/19/2012  . Abdominal hysterectomy  1963    Partial,  Due to bleeding after delivery    There were no vitals filed for this visit.       Subjective Assessment - 11/09/15 1530    Subjective Patient reports that she had C-diff infection about 9 months  ago and reports that she just really feels worn down and can't get over it.  She also reports pain moslty in the right leg, she is unsure of a cause.  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   Limitations Sitting;Lifting;Standing;Walking;House hold activities   Patient Stated Goals walk better and have less pain   Currently in Pain? Yes   Pain Score 7    Pain Location Back   Pain Orientation Right;Lower   Pain Descriptors / Indicators Aching;Sore;Shooting   Pain Type Chronic pain   Pain Radiating Towards some pain in the right hip and legs   Pain Onset More than a month ago   Pain Frequency Constant   Aggravating Factors  doing housework will increase pain to 9/10, standing will also increase the pain   Pain Relieving Factors rest   Effect of Pain on Daily Activities difficulty with any ADL's            [facility] PT Assessment - 11/09/15 0001    Assessment   Medical Diagnosis weakness   Referring Provider [place]   Onset Date/Surgical Date 10/10/15   Precautions   Precautions None   Balance Screen   Has the patient fallen in the past 6 months Yes  How many times? 2   Has the patient had a decrease in activity level because of a fear of falling?  No   Is the patient reluctant to leave their home because of a fear of falling?  No   Home Environment   Additional Comments does some housework, 5 steps into house   Prior Function   Level of Independence Independent   Vocation Retired   Leisure no exercise   Posture/Postural Control   Posture Comments fwd head, rounded shoulders   ROM / Strength   AROM / PROM / Strength AROM;Strength   AROM   Overall AROM Comments Lumbar ROM decreased 50%, hip and knee ROM WFL's with pain in the back and in the legs   Strength   Overall Strength Comments 3+/5 with weakness and pain in the back   Palpation   Palpation comment she is very tender in the low back into the  buttocks   Ambulation/Gait   Gait Comments she had to stop and rest twice in 150 feet of ambulation, she is unsteady with walking, has antalgic gait on the right with very small steps, she does not use an assistive device today but reports that she uses a walker sometimes at home   Standardized Balance Assessment   Standardized Balance Assessment Timed Up and Go Test   Timed Up and Go Test   Normal TUG (seconds) 37   TUG Comments very small steps, almost stumbling                   [facility] Adult PT Treatment/Exercise - 11/09/15 0001    Modalities   Modalities Cryotherapy;Electrical Stimulation   Cryotherapy   Number Minutes Cryotherapy 15 Minutes   Cryotherapy Location Lumbar Spine   Type of Cryotherapy Ice pack   Electrical Stimulation   Electrical Stimulation Location lumbar area   Electrical Stimulation Action IFC   Electrical Stimulation Parameters sitting   Electrical Stimulation Goals Pain                  PT Short Term Goals - 11/09/15 1557    PT SHORT TERM GOAL #1   Title independent iwth initial HEP   Time 2   Period Weeks   Status New           PT Long Term Goals - 11/09/15 1558    PT LONG TERM GOAL #1   Title decrease TUG time to 24 seconds   Time 8   Period Weeks   Status New   PT LONG TERM GOAL #2   Title decrease pain 25%   Time 8   Period Weeks   Status New   PT LONG TERM GOAL #3   Title walk 180 feet without rest   Time 8   Period Weeks   Status New   PT LONG TERM GOAL #4   Title increase LE strength to 4-/5   Time 8   Period Weeks   Status New   PT LONG TERM GOAL #5   Title no falls over past 4 weeks   Time 8   Period Weeks   Status New               Plan - 11/09/15 1555    Clinical Impression Statement Patient reports that she has had increased difficulty with walking and ADL's after a C-diff infection last July.  She reports that she had to rest 2 times when walking in to our building today, her  TUG time  was 37 seconds, she walks with very small steps and is unsteady on her feet.  She has had 2 falls in the past 6 months   Rehab Potential Good   PT Frequency 2x / week   PT Duration 8 weeks   PT Treatment/Interventions ADLs/Self Care Home Management;Electrical Stimulation;Cryotherapy;Moist Heat;Therapeutic exercise;Therapeutic activities;Functional mobility training;Gait training;Ultrasound;Balance training;Neuromuscular re-education;Patient/family education;Manual techniques   PT Next Visit Plan issue HEP, do Berg balance test, start exercise for fitness   Consulted and Agree with Plan of Care Patient      Patient will benefit from skilled therapeutic intervention in order to improve the following deficits and impairments:  Abnormal gait, Decreased activity tolerance, Decreased mobility, Decreased endurance, Decreased range of motion, Difficulty walking, Decreased strength, Impaired flexibility, Improper body mechanics, Postural dysfunction, Pain  Visit Diagnosis: Muscle weakness (generalized) - Plan: PT plan of care cert/re-cert  Pain In Right Leg - Plan: PT plan of care cert/re-cert      G-Codes - [id number]    Functional Assessment Tool Used foto 77% limitation   Functional Limitation Mobility: Walking and moving around   Mobility: Walking and Moving Around Current Status [id number]) At least 60 percent but less than 80 percent impaired, limited or restricted   Mobility: Walking and Moving Around Goal Status [id number]) At least 60 percent but less than 80 percent impaired, limited or restricted       Problem List Patient Active Problem List   Diagnosis Date Noted  . Other fatigue 10/18/2015  . Right knee pain 10/18/2015  . Acrochordon 08/03/2015  . Stasis dermatitis of both legs 01/18/2015  . Radiculopathy of lumbar region 01/06/2015  . Seborrheic keratosis 11/24/2014  . OSA (obstructive sleep apnea) 09/21/2014  . Osteoporosis 06/29/2014  . Thyroid nodule 06/29/2014  . Breast  calcifications 06/29/2014  . Glaucoma 01/08/2014  . Trigeminal neuralgia 12/18/2013  . Basal cell carcinoma 11/12/2013  . Presbyopia 10/22/2013  . Resting tremor 10/14/2013  . Hearing loss 10/13/2013  . Polymyalgia rheumatica ([place]) 09/11/2013  . GERD (gastroesophageal reflux disease) 09/11/2013  . Pulmonary nodules 05/24/2012  . PVD (peripheral vascular disease) ([place]) 04/30/2012  . Carotid bruit 04/30/2012  . Hypertension 04/27/2012  . Anxiety and depression 04/27/2012  . Hyperlipemia 04/27/2012  . Cervical spondylosis with radiculopathy 04/19/2012    Sumner Boast., PT 11/09/2015, 4:05 PM  [place], [place], [address] Phone: [phone]   Fax:  [phone]  Name: [name] [name] MRN: [id number] Date of Birth: [birth year]

## 2015-11-11 ENCOUNTER — Ambulatory Visit: Payer: PPO

## 2015-11-15 ENCOUNTER — Ambulatory Visit: Payer: PPO | Admitting: Physical Therapy

## 2015-11-18 ENCOUNTER — Ambulatory Visit: Payer: PPO | Admitting: Physical Therapy

## 2015-11-23 ENCOUNTER — Ambulatory Visit: Payer: PPO | Admitting: Physical Therapy

## 2015-11-23 ENCOUNTER — Telehealth: Payer: Self-pay | Admitting: Psychology

## 2015-11-23 ENCOUNTER — Telehealth: Payer: Self-pay | Admitting: Licensed Clinical Social Worker

## 2015-11-23 ENCOUNTER — Encounter: Payer: Self-pay | Admitting: Physical Therapy

## 2015-11-23 DIAGNOSIS — M6281 Muscle weakness (generalized): Secondary | ICD-10-CM

## 2015-11-23 NOTE — Telephone Encounter (Signed)
Patient called to cancel appointment for [facility].  Apparently she couldn't get a hold of Casimer Lanius whom she was scheduled to see.  Would like a call back from [name] to reschedule.  She will be home in about an hour.

## 2015-11-23 NOTE — Therapy (Signed)
[place], [place], [address] Phone: [phone]   Fax:  [phone]  Physical Therapy Treatment  Patient Details  Name: [name] [name] MRN: [id number] Date of Birth: 1940/08/04 Referring Provider: Andria Frames  Encounter Date: 11/23/2015      PT End of Session - 11/23/15 1433    Visit Number 2   Date for PT Re-Evaluation 01/09/16   PT Start Time 1400   PT Stop Time 1450   PT Time Calculation (min) 50 min      Past Medical History  Diagnosis Date  . Hypertension   . Heart murmur   . Pneumonia   . Anxiety   . Depression   . Psoriasis   . Arthritis   . Hypercholesteremia   . Congenital absence of one kidney     Pt has right kidney only  . Carpal tunnel syndrome, bilateral   . Chronic kidney disease     CKD stage III, absence of left kidney  . Peripheral vascular disease ([place])     Dr. Benjamine Sprague; right external iliac stent '06 with angioplasty '13  . Polymyalgia rheumatica (HCC)     Past Surgical History  Procedure Laterality Date  . Appendectomy    . Tonsillectomy    . Eye surgery    . Iliac artery stent    . Anterior cervical decomp/discectomy fusion  04/19/2012    Procedure: ANTERIOR CERVICAL DECOMPRESSION/DISCECTOMY FUSION 2 LEVELS;  Surgeon: Winfield Cunas, MD;  Location: [place] [facility];  Service: Neurosurgery;  Laterality: N/A;  Cervical four-five,Cervical five-six anterior cervical decompression with fusion plating and bonegraft possible posterior cervical decompression  . Carpal tunnel release  1990  . Cervical fusion  04/19/2012  . Abdominal hysterectomy  1963    Partial,  Due to bleeding after delivery    There were no vitals filed for this visit.      Subjective Assessment - 11/23/15 1359    Subjective I am ready to get better-get my strength back. back and legs always hurt, relief with estim. I have not been here for awhiel d/t BP all messed up   Currently in Pain? Yes   Pain  Score 7    Pain Location Back                         [facility] Adult PT Treatment/Exercise - 11/23/15 0001    Exercises   Exercises Lumbar;Knee/Hip   Lumbar Exercises: Aerobic   Stationary Bike 5 min   Elliptical Nustep L 2 5 min   Lumbar Exercises: Seated   Other Seated Lumbar Exercises red tband scap 3 way 10 times   Other Seated Lumbar Exercises red tbad bicep curl 10 times   Knee/Hip Exercises: Seated   Long Arc Quad Strengthening;Both;2 sets;10 reps  2#   Other Seated Knee/Hip Exercises 2# marching and abd 2 sets 10   Cryotherapy   Number Minutes Cryotherapy 15 Minutes   Cryotherapy Location Lumbar Spine   Type of Cryotherapy Ice pack   Electrical Stimulation   Electrical Stimulation Location lumbar area   Electrical Stimulation Action IFC   Electrical Stimulation Goals Pain                  PT Short Term Goals - 11/23/15 1434    PT SHORT TERM GOAL #1   Title independent iwth initial HEP   Baseline doing exercises from HHPT   Status Achieved  PT Long Term Goals - 11/09/15 1558    PT LONG TERM GOAL #1   Title decrease TUG time to 24 seconds   Time 8   Period Weeks   Status New   PT LONG TERM GOAL #2   Title decrease pain 25%   Time 8   Period Weeks   Status New   PT LONG TERM GOAL #3   Title walk 180 feet without rest   Time 8   Period Weeks   Status New   PT LONG TERM GOAL #4   Title increase LE strength to 4-/5   Time 8   Period Weeks   Status New   PT LONG TERM GOAL #5   Title no falls over past 4 weeks   Time 8   Period Weeks   Status New               Plan - 11/23/15 1433    Clinical Impression Statement pt tolerated ther ex well but fatigued and pt was surprised how little activity made her so tired,relief from modalities   PT Next Visit Plan assess how felt after session and as tolerated. est HEP ( currently pt is doing some ther ex from [place])      Patient will benefit from skilled therapeutic  intervention in order to improve the following deficits and impairments:  Abnormal gait, Decreased activity tolerance, Decreased mobility, Decreased endurance, Decreased range of motion, Difficulty walking, Decreased strength, Impaired flexibility, Improper body mechanics, Postural dysfunction, Pain  Visit Diagnosis: Muscle weakness (generalized)     Problem List Patient Active Problem List   Diagnosis Date Noted  . Other fatigue 10/18/2015  . Right knee pain 10/18/2015  . Acrochordon 08/03/2015  . Stasis dermatitis of both legs 01/18/2015  . Radiculopathy of lumbar region 01/06/2015  . Seborrheic keratosis 11/24/2014  . OSA (obstructive sleep apnea) 09/21/2014  . Osteoporosis 06/29/2014  . Thyroid nodule 06/29/2014  . Breast calcifications 06/29/2014  . Glaucoma 01/08/2014  . Trigeminal neuralgia 12/18/2013  . Basal cell carcinoma 11/12/2013  . Presbyopia 10/22/2013  . Resting tremor 10/14/2013  . Hearing loss 10/13/2013  . Polymyalgia rheumatica ([place]) 09/11/2013  . GERD (gastroesophageal reflux disease) 09/11/2013  . Pulmonary nodules 05/24/2012  . PVD (peripheral vascular disease) ([place]) 04/30/2012  . Carotid bruit 04/30/2012  . Hypertension 04/27/2012  . Anxiety and depression 04/27/2012  . Hyperlipemia 04/27/2012  . Cervical spondylosis with radiculopathy 04/19/2012    PAYSEUR,[name] PTA 11/23/2015, 2:35 PM  [place], [place], [address] Phone: [phone]   Fax:  [phone]  Name: OSHAY DOCKEN MRN: [id number] Date of Birth: 15-Mar-1941

## 2015-11-23 NOTE — Telephone Encounter (Signed)
Call to patient to schedule INT follow up appointment for May 23 at 11:00.  [name] [name]. [place] [employer],  [phone] 4:42 PM

## 2015-11-25 ENCOUNTER — Encounter: Payer: Self-pay | Admitting: Physical Therapy

## 2015-11-25 ENCOUNTER — Ambulatory Visit: Payer: PPO | Admitting: Physical Therapy

## 2015-11-25 DIAGNOSIS — M6281 Muscle weakness (generalized): Secondary | ICD-10-CM | POA: Diagnosis not present

## 2015-11-25 NOTE — Therapy (Signed)
[place], [place], [address] Phone: [phone]   Fax:  [phone]  Physical Therapy Treatment  Patient Details  Name: [name] [name] MRN: [id number] Date of Birth: 1941/04/03 Referring Provider: Andria Frames  Encounter Date: 11/25/2015      PT End of Session - 11/25/15 1445    Visit Number 3   Date for PT Re-Evaluation 01/09/16   PT Start Time 1400   PT Stop Time 1500   PT Time Calculation (min) 60 min      Past Medical History  Diagnosis Date  . Hypertension   . Heart murmur   . Pneumonia   . Anxiety   . Depression   . Psoriasis   . Arthritis   . Hypercholesteremia   . Congenital absence of one kidney     Pt has right kidney only  . Carpal tunnel syndrome, bilateral   . Chronic kidney disease     CKD stage III, absence of left kidney  . Peripheral vascular disease ([place])     Dr. Benjamine Sprague; right external iliac stent '06 with angioplasty '13  . Polymyalgia rheumatica (HCC)     Past Surgical History  Procedure Laterality Date  . Appendectomy    . Tonsillectomy    . Eye surgery    . Iliac artery stent    . Anterior cervical decomp/discectomy fusion  04/19/2012    Procedure: ANTERIOR CERVICAL DECOMPRESSION/DISCECTOMY FUSION 2 LEVELS;  Surgeon: Winfield Cunas, MD;  Location: [place] [facility];  Service: Neurosurgery;  Laterality: N/A;  Cervical four-five,Cervical five-six anterior cervical decompression with fusion plating and bonegraft possible posterior cervical decompression  . Carpal tunnel release  1990  . Cervical fusion  04/19/2012  . Abdominal hysterectomy  1963    Partial,  Due to bleeding after delivery    There were no vitals filed for this visit.      Subjective Assessment - 11/25/15 1402    Subjective sore after last session but worked thru it. CT in RT arm really bothering me ( pt arrived wearing brace)   Currently in Pain? Yes   Pain Score 6    Pain Location Back                          [facility] Adult PT Treatment/Exercise - 11/25/15 0001    High Level Balance   High Level Balance Activities Side stepping;Backward walking;Marching forwards  HHA red tband   Lumbar Exercises: Aerobic   Stationary Bike 6 min   Elliptical L 4 75min   Lumbar Exercises: Seated   Sit to Stand 10 reps   Knee/Hip Exercises: Machines for Strengthening   Cybex Knee Extension 5# 2 sets 10   Cybex Knee Flexion 15# 2 sets 10   Other Machine ball squeeze 3 sec hold 2 sets 15  green tband seated hip abd 2 sets 15   Knee/Hip Exercises: Seated   Other Seated Knee/Hip Exercises marching green tband 2 sets 10                  PT Short Term Goals - 11/23/15 1434    PT SHORT TERM GOAL #1   Title independent iwth initial HEP   Baseline doing exercises from HHPT   Status Achieved           PT Long Term Goals - 11/09/15 1558    PT LONG TERM GOAL #1   Title  decrease TUG time to 24 seconds   Time 8   Period Weeks   Status New   PT LONG TERM GOAL #2   Title decrease pain 25%   Time 8   Period Weeks   Status New   PT LONG TERM GOAL #3   Title walk 180 feet without rest   Time 8   Period Weeks   Status New   PT LONG TERM GOAL #4   Title increase LE strength to 4-/5   Time 8   Period Weeks   Status New   PT LONG TERM GOAL #5   Title no falls over past 4 weeks   Time 8   Period Weeks   Status New               Plan - 11/25/15 1445    Clinical Impression Statement weak abd and quad noted with ther ex, LOB backward and difficulty side stepping. Cooperative and motivated with ther ex   PT Next Visit Plan Issue HEP      Patient will benefit from skilled therapeutic intervention in order to improve the following deficits and impairments:     Visit Diagnosis: Muscle weakness (generalized)     Problem List Patient Active Problem List   Diagnosis Date Noted  . Other fatigue 10/18/2015  . Right knee pain 10/18/2015  .  Acrochordon 08/03/2015  . Stasis dermatitis of both legs 01/18/2015  . Radiculopathy of lumbar region 01/06/2015  . Seborrheic keratosis 11/24/2014  . OSA (obstructive sleep apnea) 09/21/2014  . Osteoporosis 06/29/2014  . Thyroid nodule 06/29/2014  . Breast calcifications 06/29/2014  . Glaucoma 01/08/2014  . Trigeminal neuralgia 12/18/2013  . Basal cell carcinoma 11/12/2013  . Presbyopia 10/22/2013  . Resting tremor 10/14/2013  . Hearing loss 10/13/2013  . Polymyalgia rheumatica ([place]) 09/11/2013  . GERD (gastroesophageal reflux disease) 09/11/2013  . Pulmonary nodules 05/24/2012  . PVD (peripheral vascular disease) ([place]) 04/30/2012  . Carotid bruit 04/30/2012  . Hypertension 04/27/2012  . Anxiety and depression 04/27/2012  . Hyperlipemia 04/27/2012  . Cervical spondylosis with radiculopathy 04/19/2012    PAYSEUR,[name] PTA 11/25/2015, 2:47 PM  [place], [place], [address] Phone: [phone]   Fax:  [phone]  Name: [name] [name] MRN: [id number] Date of Birth: 1941-06-28

## 2015-11-30 ENCOUNTER — Ambulatory Visit: Payer: PPO | Admitting: Physical Therapy

## 2015-11-30 ENCOUNTER — Encounter: Payer: Self-pay | Admitting: Physical Therapy

## 2015-11-30 DIAGNOSIS — L821 Other seborrheic keratosis: Secondary | ICD-10-CM | POA: Diagnosis not present

## 2015-11-30 DIAGNOSIS — L812 Freckles: Secondary | ICD-10-CM | POA: Diagnosis not present

## 2015-11-30 DIAGNOSIS — M79604 Pain in right leg: Secondary | ICD-10-CM

## 2015-11-30 DIAGNOSIS — D225 Melanocytic nevi of trunk: Secondary | ICD-10-CM | POA: Diagnosis not present

## 2015-11-30 DIAGNOSIS — M6281 Muscle weakness (generalized): Secondary | ICD-10-CM

## 2015-11-30 DIAGNOSIS — L570 Actinic keratosis: Secondary | ICD-10-CM | POA: Diagnosis not present

## 2015-11-30 NOTE — Therapy (Signed)
[place] [address] [place], [place], [address] Phone: [phone]   Fax:  [phone]  Physical Therapy Treatment  Patient Details  Name: [name] [name] [name] MRN: [id number] Date of Birth: 1941-01-14 Referring Provider: Andria [name] [name]  Encounter Date: 11/30/2015      PT End of Session - 11/30/15 1050    Visit Number 4   Date for PT Re-Evaluation 01/09/16   PT Start Time 5284   PT Stop Time 1110   PT Time Calculation (min) 55 min      Past Medical History  Diagnosis Date  . Hypertension   . Heart murmur   . Pneumonia   . Anxiety   . Depression   . Psoriasis   . Arthritis   . Hypercholesteremia   . Congenital absence of one kidney     Pt has right kidney only  . Carpal tunnel syndrome, bilateral   . Chronic kidney disease     CKD stage III, absence of left kidney  . Peripheral vascular disease ([place])     Dr. Benjamine [name] [name]; right external iliac stent '06 with angioplasty '13  . Polymyalgia rheumatica (HCC)     Past Surgical History  Procedure Laterality Date  . Appendectomy    . Tonsillectomy    . Eye surgery    . Iliac artery stent    . Anterior cervical decomp/discectomy fusion  04/19/2012    Procedure: ANTERIOR CERVICAL DECOMPRESSION/DISCECTOMY FUSION 2 LEVELS;  Surgeon: [name] Cunas, MD;  Location: [facility];  Service: Neurosurgery;  Laterality: N/A;  Cervical four-five,Cervical five-six anterior cervical decompression with fusion plating and bonegraft possible posterior cervical decompression  . Carpal tunnel release  1990  . Cervical fusion  04/19/2012  . Abdominal hysterectomy  1963    Partial,  Due to bleeding after delivery    There were no vitals filed for this visit.      Subjective Assessment - 11/30/15 1018    Subjective doing okay except RT shld is killing me   Currently in Pain? Yes   Pain Score 8    Pain Location Shoulder   Pain Orientation Right                          [facility] Adult PT Treatment/Exercise - 11/30/15 0001    Lumbar Exercises: Aerobic   Stationary Bike 5 min   Elliptical L 4 18mn   Knee/Hip Exercises: Machines for Strengthening   Cybex Knee Extension 5# 3 sets 10   Cybex Knee Flexion 15# 3 sets 10   Cybex Leg Press 20# 3 sets 10   Knee/Hip Exercises: Standing   Lateral Step Up Both;1 set;10 reps;Hand Hold: 2;Step [name] [name]: 6"   Forward Step Up Both;1 set;10 reps;Hand Hold: 2;Step [name] [name]: 6"   Other Standing Knee Exercises red tband hip 3 way 15 reps each    Cryotherapy   Number Minutes Cryotherapy 15 Minutes   Cryotherapy Location Lumbar Spine   Type of Cryotherapy Ice pack   Electrical Stimulation   Electrical Stimulation Location lumbar area   Electrical Stimulation Action IFC   Electrical Stimulation Goals Pain                  PT Short Term Goals - 11/23/15 1434    PT SHORT TERM GOAL #1   Title independent iwth initial HEP   Baseline doing exercises from HHPT   Status Achieved  PT Long Term Goals - 11/30/15 1048    PT LONG TERM GOAL #1   Title decrease TUG time to 24 seconds   Baseline 14 sec    Status Achieved   PT LONG TERM GOAL #2   Title decrease pain 25%   Status On-going   PT LONG TERM GOAL #3   Status On-going   PT LONG TERM GOAL #4   Title increase LE strength to 4-/5   Status On-going   PT LONG TERM GOAL #5   Title no falls over past 4 weeks   Status On-going               Plan - 11/30/15 1050    Clinical Impression Statement pt tolerating increased ther ex but did fatigue with hip ther ex ,visib;y wobbly legs and requiring rest. Pt noting increased walking distance at home. TUG goal met   PT Next Visit Plan added red tband for home standing HEP from HHPT ( standing 3 way kick,marching and squats)      Patient will benefit from skilled therapeutic intervention in order to improve the following deficits and impairments:  Abnormal gait, Decreased activity tolerance, Decreased  mobility, Decreased endurance, Decreased range of motion, Difficulty walking, Decreased strength, Impaired flexibility, Improper body mechanics, Postural dysfunction, Pain  Visit Diagnosis: Muscle weakness (generalized)  Pain In Right Leg     Problem List Patient Active Problem List   Diagnosis Date Noted  . Other fatigue 10/18/2015  . Right knee pain 10/18/2015  . Acrochordon 08/03/2015  . Stasis dermatitis of both legs 01/18/2015  . Radiculopathy of lumbar region 01/06/2015  . Seborrheic keratosis 11/24/2014  . OSA (obstructive sleep apnea) 09/21/2014  . Osteoporosis 06/29/2014  . Thyroid nodule 06/29/2014  . Breast calcifications 06/29/2014  . Glaucoma 01/08/2014  . Trigeminal neuralgia 12/18/2013  . Basal cell carcinoma 11/12/2013  . Presbyopia 10/22/2013  . Resting tremor 10/14/2013  . Hearing loss 10/13/2013  . Polymyalgia rheumatica ([place]) 09/11/2013  . GERD (gastroesophageal reflux disease) 09/11/2013  . Pulmonary nodules 05/24/2012  . PVD (peripheral vascular disease) ([place]) 04/30/2012  . Carotid bruit 04/30/2012  . Hypertension 04/27/2012  . Anxiety and depression 04/27/2012  . Hyperlipemia 04/27/2012  . Cervical spondylosis with radiculopathy 04/19/2012    [name] [name] [name],[name] [name] [name] 11/30/2015, 10:54 AM  [address] [place], [place], [address] Phone: [phone]   Fax:  [phone]  Name: [name] [name] [name] MRN: [id number] Date of Birth: [birth year]

## 2015-12-01 ENCOUNTER — Ambulatory Visit: Payer: PPO

## 2015-12-02 ENCOUNTER — Encounter: Payer: Self-pay | Admitting: Physical Therapy

## 2015-12-02 ENCOUNTER — Ambulatory Visit: Payer: PPO | Admitting: Physical Therapy

## 2015-12-02 DIAGNOSIS — M6281 Muscle weakness (generalized): Secondary | ICD-10-CM | POA: Diagnosis not present

## 2015-12-02 DIAGNOSIS — M79604 Pain in right leg: Secondary | ICD-10-CM

## 2015-12-02 NOTE — Therapy (Signed)
[place] [address] [place], [place], [address] Phone: [phone]   Fax:  [phone]  Physical Therapy Treatment  Patient Details  Name: [name] [name] MRN: [id number] Date of Birth: [birth year] Referring Provider: Andria Frames  Encounter Date: 12/02/2015      PT End of Session - 12/02/15 1552    Visit Number 5   Date for PT Re-Evaluation 01/09/16   PT Start Time 1525   PT Stop Time 1620   PT Time Calculation (min) 55 min      Past Medical History  Diagnosis Date  . Hypertension   . Heart murmur   . Pneumonia   . Anxiety   . Depression   . Psoriasis   . Arthritis   . Hypercholesteremia   . Congenital absence of one kidney     Pt has right kidney only  . Carpal tunnel syndrome, bilateral   . Chronic kidney disease     CKD stage III, absence of left kidney  . Peripheral vascular disease ([place])     Dr. Benjamine Sprague; right external iliac stent '06 with angioplasty '13  . Polymyalgia rheumatica (HCC)     Past Surgical History  Procedure Laterality Date  . Appendectomy    . Tonsillectomy    . Eye surgery    . Iliac artery stent    . Anterior cervical decomp/discectomy fusion  04/19/2012    Procedure: ANTERIOR CERVICAL DECOMPRESSION/DISCECTOMY FUSION 2 LEVELS;  Surgeon: Winfield Cunas, MD;  Location: [facility];  Service: Neurosurgery;  Laterality: N/A;  Cervical four-five,Cervical five-six anterior cervical decompression with fusion plating and bonegraft possible posterior cervical decompression  . Carpal tunnel release  1990  . Cervical fusion  04/19/2012  . Abdominal hysterectomy  1963    Partial,  Due to bleeding after delivery    There were no vitals filed for this visit.      Subjective Assessment - 12/02/15 1531    Subjective RT arm and hand are hurtimg,legs getting alot stringer and I am walking better and further   Currently in Pain? Yes   Pain Score 7    Pain Location Shoulder   Pain Orientation  Right                         [facility] Adult PT Treatment/Exercise - 12/02/15 0001    High Level Balance   High Level Balance Activities Side stepping;Backward walking;Marching forwards;Negotitating around obstacles;Negotiating over obstacles;Tandem walking   Lumbar Exercises: Aerobic   Stationary Bike 5 min   Elliptical Nustep L 5 7 min   Knee/Hip Exercises: Machines for Strengthening   Cybex Knee Extension 5# 3 sets 10   Cybex Knee Flexion 15# 3 sets 10   Cybex Leg Press 20# 3 sets 10   Cryotherapy   Number Minutes Cryotherapy 15 Minutes   Cryotherapy Location Lumbar Spine   Type of Cryotherapy Ice pack   Electrical Stimulation   Electrical Stimulation Location lumbar area   Electrical Stimulation Action IFC   Electrical Stimulation Goals Pain                  PT Short Term Goals - 11/23/15 1434    PT SHORT TERM GOAL #1   Title independent iwth initial HEP   Baseline doing exercises from HHPT   Status Achieved           PT Long Term Goals - 11/30/15 1048  PT LONG TERM GOAL #1   Title decrease TUG time to 24 seconds   Baseline 14 sec    Status Achieved   PT LONG TERM GOAL #2   Title decrease pain 25%   Status On-going   PT LONG TERM GOAL #3   Status On-going   PT LONG TERM GOAL #4   Title increase LE strength to 4-/5   Status On-going   PT LONG TERM GOAL #5   Title no falls over past 4 weeks   Status On-going               Plan - 12/02/15 1553    Clinical Impression Statement difficulty with advanced balance activity. Improved balacne with gait and reports increased distance before legs fatigue.   PT Next Visit Plan try walking around building      Patient will benefit from skilled therapeutic intervention in order to improve the following deficits and impairments:  Abnormal gait, Decreased activity tolerance, Decreased mobility, Decreased endurance, Decreased range of motion, Difficulty walking, Decreased strength,  Impaired flexibility, Improper body mechanics, Postural dysfunction, Pain  Visit Diagnosis: Muscle weakness (generalized)  Pain In Right Leg     Problem List Patient Active Problem List   Diagnosis Date Noted  . Other fatigue 10/18/2015  . Right knee pain 10/18/2015  . Acrochordon 08/03/2015  . Stasis dermatitis of both legs 01/18/2015  . Radiculopathy of lumbar region 01/06/2015  . Seborrheic keratosis 11/24/2014  . OSA (obstructive sleep apnea) 09/21/2014  . Osteoporosis 06/29/2014  . Thyroid nodule 06/29/2014  . Breast calcifications 06/29/2014  . Glaucoma 01/08/2014  . Trigeminal neuralgia 12/18/2013  . Basal cell carcinoma 11/12/2013  . Presbyopia 10/22/2013  . Resting tremor 10/14/2013  . Hearing loss 10/13/2013  . Polymyalgia rheumatica ([place]) 09/11/2013  . GERD (gastroesophageal reflux disease) 09/11/2013  . Pulmonary nodules 05/24/2012  . PVD (peripheral vascular disease) ([place]) 04/30/2012  . Carotid bruit 04/30/2012  . Hypertension 04/27/2012  . Anxiety and depression 04/27/2012  . Hyperlipemia 04/27/2012  . Cervical spondylosis with radiculopathy 04/19/2012    [name],[name] PTA 12/02/2015, 3:56 PM  [address] [place], [place], [address] Phone: [phone]   Fax:  [phone]  Name: [name] [name] MRN: [id number] Date of Birth: [birth year]

## 2015-12-07 ENCOUNTER — Ambulatory Visit: Payer: PPO | Admitting: Physical Therapy

## 2015-12-07 ENCOUNTER — Encounter: Payer: Self-pay | Admitting: Physical Therapy

## 2015-12-07 DIAGNOSIS — M6281 Muscle weakness (generalized): Secondary | ICD-10-CM | POA: Diagnosis not present

## 2015-12-07 DIAGNOSIS — M79604 Pain in right leg: Secondary | ICD-10-CM

## 2015-12-07 NOTE — Therapy (Signed)
[facility] [address] [place], [place], [address] Phone: [phone]   Fax:  [phone]  Physical Therapy Treatment  Patient Details  Name: [name] [name] MRN: [id number] Date of Birth: 11-04-1940 Referring Provider: Andria Frames  Encounter Date: 12/07/2015      PT End of Session - 12/07/15 1429    Visit Number 6   Date for PT Re-Evaluation 01/09/16   PT Start Time 1345   PT Stop Time 1443   PT Time Calculation (min) 58 min   Activity Tolerance Patient tolerated treatment well   Behavior During Therapy [facility] for tasks assessed/performed      Past Medical History  Diagnosis Date  . Hypertension   . Heart murmur   . Pneumonia   . Anxiety   . Depression   . Psoriasis   . Arthritis   . Hypercholesteremia   . Congenital absence of one kidney     Pt has right kidney only  . Carpal tunnel syndrome, bilateral   . Chronic kidney disease     CKD stage III, absence of left kidney  . Peripheral vascular disease ([place])     Dr. Benjamine Sprague; right external iliac stent '06 with angioplasty '13  . Polymyalgia rheumatica (HCC)     Past Surgical History  Procedure Laterality Date  . Appendectomy    . Tonsillectomy    . Eye surgery    . Iliac artery stent    . Anterior cervical decomp/discectomy fusion  04/19/2012    Procedure: ANTERIOR CERVICAL DECOMPRESSION/DISCECTOMY FUSION 2 LEVELS;  Surgeon: Winfield Cunas, MD;  Location: [facility];  Service: Neurosurgery;  Laterality: N/A;  Cervical four-five,Cervical five-six anterior cervical decompression with fusion plating and bonegraft possible posterior cervical decompression  . Carpal tunnel release  1990  . Cervical fusion  04/19/2012  . Abdominal hysterectomy  1963    Partial,  Due to bleeding after delivery    There were no vitals filed for this visit.      Subjective Assessment - 12/07/15 1347    Subjective "Pretty good until I fell Friday" Pt reports that she has bruises  and bangs everywhere   Currently in Pain? Yes   Pain Score 6    Pain Location Back   Pain Orientation Left                         [facility] Adult PT Treatment/Exercise - 12/07/15 0001    High Level Balance   High Level Balance Activities Side stepping;Backward walking;Marching forwards;Negotitating around obstacles;Negotiating over obstacles;Tandem walking   Lumbar Exercises: Aerobic   Elliptical Nustep L 5 7 min   Lumbar Exercises: Seated   Sit to Stand 10 reps  x2; red weighted ball    Knee/Hip Exercises: Machines for Strengthening   Cybex Knee Extension 5# 3 sets 10   Cybex Knee Flexion 20# 2 sets 10   Cybex Leg Press 30# 2 sets 15   Modalities   Modalities Moist Heat;Electrical Stimulation   Moist Heat Therapy   Number Minutes Moist Heat 15 Minutes   Moist Heat Location Lumbar Spine   Electrical Stimulation   Electrical Stimulation Location lumbar area   Electrical Stimulation Action IFC   Electrical Stimulation Goals Pain                  PT Short Term Goals - 11/23/15 1434    PT SHORT TERM GOAL #1   Title  independent iwth initial HEP   Baseline doing exercises from HHPT   Status Achieved           PT Long Term Goals - 11/30/15 1048    PT LONG TERM GOAL #1   Title decrease TUG time to 24 seconds   Baseline 14 sec    Status Achieved   PT LONG TERM GOAL #2   Title decrease pain 25%   Status On-going   PT LONG TERM GOAL #3   Status On-going   PT LONG TERM GOAL #4   Title increase LE strength to 4-/5   Status On-going   PT LONG TERM GOAL #5   Title no falls over past 4 weeks   Status On-going               Plan - 12/07/15 1429    Clinical Impression Statement Pt with minor difficulty negotiating over objects, LOB x2 but able to recover. Pt does fatigue with exercises but no c/o increase pain during today's treatment.   Rehab Potential Good   PT Frequency 2x / week   PT Duration 8 weeks   PT Treatment/Interventions  ADLs/Self Care Home Management;Electrical Stimulation;Cryotherapy;Moist Heat;Therapeutic exercise;Therapeutic activities;Functional mobility training;Gait training;Ultrasound;Balance training;Neuromuscular re-education;Patient/family education;Manual techniques   PT Next Visit Plan try walking around building (Not tried due to weather)      Patient will benefit from skilled therapeutic intervention in order to improve the following deficits and impairments:  Abnormal gait, Decreased activity tolerance, Decreased mobility, Decreased endurance, Decreased range of motion, Difficulty walking, Decreased strength, Impaired flexibility, Improper body mechanics, Postural dysfunction, Pain  Visit Diagnosis: Muscle weakness (generalized)  Pain In Right Leg     Problem List Patient Active Problem List   Diagnosis Date Noted  . Other fatigue 10/18/2015  . Right knee pain 10/18/2015  . Acrochordon 08/03/2015  . Stasis dermatitis of both legs 01/18/2015  . Radiculopathy of lumbar region 01/06/2015  . Seborrheic keratosis 11/24/2014  . OSA (obstructive sleep apnea) 09/21/2014  . Osteoporosis 06/29/2014  . Thyroid nodule 06/29/2014  . Breast calcifications 06/29/2014  . Glaucoma 01/08/2014  . Trigeminal neuralgia 12/18/2013  . Basal cell carcinoma 11/12/2013  . Presbyopia 10/22/2013  . Resting tremor 10/14/2013  . Hearing loss 10/13/2013  . Polymyalgia rheumatica ([place]) 09/11/2013  . GERD (gastroesophageal reflux disease) 09/11/2013  . Pulmonary nodules 05/24/2012  . PVD (peripheral vascular disease) ([place]) 04/30/2012  . Carotid bruit 04/30/2012  . Hypertension 04/27/2012  . Anxiety and depression 04/27/2012  . Hyperlipemia 04/27/2012  . Cervical spondylosis with radiculopathy 04/19/2012    Scot Jun, PTA  12/07/2015, 2:31 PM  [place] [address] [place], [place], [address] Phone: [phone]   Fax:   [phone]  Name: [name] [name] MRN: [id number] Date of Birth: Apr 17, 1941

## 2015-12-09 ENCOUNTER — Ambulatory Visit: Payer: PPO | Attending: Family Medicine | Admitting: Physical Therapy

## 2015-12-09 ENCOUNTER — Encounter: Payer: Self-pay | Admitting: Physical Therapy

## 2015-12-09 DIAGNOSIS — M79604 Pain in right leg: Secondary | ICD-10-CM

## 2015-12-09 DIAGNOSIS — M6281 Muscle weakness (generalized): Secondary | ICD-10-CM

## 2015-12-09 NOTE — Therapy (Signed)
[place] [address] [place], [place], [address] Phone: [phone]   Fax:  [phone]  Physical Therapy Treatment  Patient Details  Name: [name] [name] MRN: [id number] Date of Birth: 1940/09/15 Referring Provider: Andria Frames  Encounter Date: 12/09/2015      PT End of Session - 12/09/15 1445    Visit Number 7   Date for PT Re-Evaluation 01/09/16   PT Start Time [id number]   PT Stop Time 1500   PT Time Calculation (min) 62 min      Past Medical History  Diagnosis Date  . Hypertension   . Heart murmur   . Pneumonia   . Anxiety   . Depression   . Psoriasis   . Arthritis   . Hypercholesteremia   . Congenital absence of one kidney     Pt has right kidney only  . Carpal tunnel syndrome, bilateral   . Chronic kidney disease     CKD stage III, absence of left kidney  . Peripheral vascular disease ([place])     Dr. Benjamine Sprague; right external iliac stent '06 with angioplasty '13  . Polymyalgia rheumatica (HCC)     Past Surgical History  Procedure Laterality Date  . Appendectomy    . Tonsillectomy    . Eye surgery    . Iliac artery stent    . Anterior cervical decomp/discectomy fusion  04/19/2012    Procedure: ANTERIOR CERVICAL DECOMPRESSION/DISCECTOMY FUSION 2 LEVELS;  Surgeon: Winfield Cunas, MD;  Location: [facility];  Service: Neurosurgery;  Laterality: N/A;  Cervical four-five,Cervical five-six anterior cervical decompression with fusion plating and bonegraft possible posterior cervical decompression  . Carpal tunnel release  1990  . Cervical fusion  04/19/2012  . Abdominal hysterectomy  1963    Partial,  Due to bleeding after delivery    There were no vitals filed for this visit.      Subjective Assessment - 12/09/15 1412    Subjective getting stronger and pain overall is less   Currently in Pain? Yes   Pain Score 5    Pain Location Back                         [facility] Adult PT  Treatment/Exercise - 12/09/15 0001    Ambulation/Gait   Gait Comments amb around bldg with 1 stadning rest break and 1 seated rest break, side to side sway and narrow BOS. Poor quad strength for going up and down curbs.   Lumbar Exercises: Aerobic   Elliptical Nustep L 5 7 min   Lumbar Exercises: Machines for Strengthening   Other Lumbar Machine Exercise seated row 20 # 2 sets 10   Knee/Hip Exercises: Machines for Strengthening   Cybex Knee Extension 5# 3 sets 10   Cybex Knee Flexion 20# 2 sets 10   Cybex Leg Press 30# 2 sets 15   Knee/Hip Exercises: Standing   Walking with Sports Cord 20# 5 times fwd/back,2 times each side  min A very wobbliy esp in left LE   Moist Heat Therapy   Number Minutes Moist Heat 15 Minutes   Moist Heat Location Lumbar Spine   Electrical Stimulation   Electrical Stimulation Location lumbar area   Electrical Stimulation Action IFC   Electrical Stimulation Goals Pain                  PT Short Term Goals - 11/23/15 1434    PT SHORT  TERM GOAL #1   Title independent iwth initial HEP   Baseline doing exercises from HHPT   Status Achieved           PT Long Term Goals - 11/30/15 1048    PT LONG TERM GOAL #1   Title decrease TUG time to 24 seconds   Baseline 14 sec    Status Achieved   PT LONG TERM GOAL #2   Title decrease pain 25%   Status On-going   PT LONG TERM GOAL #3   Status On-going   PT LONG TERM GOAL #4   Title increase LE strength to 4-/5   Status On-going   PT LONG TERM GOAL #5   Title no falls over past 4 weeks   Status On-going               Plan - 12/09/15 1445    Clinical Impression Statement pt very wobbiliy with gait around bldg and uneven terrain, also fatigued quickly with resistive gait.  Cuing for full ROM with weight machine ther ex   PT Next Visit Plan endurance/balance and strength      Patient will benefit from skilled therapeutic intervention in order to improve the following deficits and  impairments:  Abnormal gait, Decreased activity tolerance, Decreased mobility, Decreased endurance, Decreased range of motion, Difficulty walking, Decreased strength, Impaired flexibility, Improper body mechanics, Postural dysfunction, Pain  Visit Diagnosis: Muscle weakness (generalized)  Pain In Right Leg     Problem List Patient Active Problem List   Diagnosis Date Noted  . Other fatigue 10/18/2015  . Right knee pain 10/18/2015  . Acrochordon 08/03/2015  . Stasis dermatitis of both legs 01/18/2015  . Radiculopathy of lumbar region 01/06/2015  . Seborrheic keratosis 11/24/2014  . OSA (obstructive sleep apnea) 09/21/2014  . Osteoporosis 06/29/2014  . Thyroid nodule 06/29/2014  . Breast calcifications 06/29/2014  . Glaucoma 01/08/2014  . Trigeminal neuralgia 12/18/2013  . Basal cell carcinoma 11/12/2013  . Presbyopia 10/22/2013  . Resting tremor 10/14/2013  . Hearing loss 10/13/2013  . Polymyalgia rheumatica ([place]) 09/11/2013  . GERD (gastroesophageal reflux disease) 09/11/2013  . Pulmonary nodules 05/24/2012  . PVD (peripheral vascular disease) ([place]) 04/30/2012  . Carotid bruit 04/30/2012  . Hypertension 04/27/2012  . Anxiety and depression 04/27/2012  . Hyperlipemia 04/27/2012  . Cervical spondylosis with radiculopathy 04/19/2012    Braulio Kiedrowski,[name] PTA 12/09/2015, 2:47 PM  [place] [address] [place], [place], [address] Phone: [phone]   Fax:  [phone]  Name: [name] [name] MRN: [id number] Date of Birth: [birth year]

## 2015-12-10 ENCOUNTER — Ambulatory Visit (INDEPENDENT_AMBULATORY_CARE_PROVIDER_SITE_OTHER): Payer: PPO | Admitting: Cardiovascular Disease

## 2015-12-10 ENCOUNTER — Encounter: Payer: Self-pay | Admitting: Cardiovascular Disease

## 2015-12-10 VITALS — BP 130/72 | HR 87 | Ht 61.0 in | Wt 148.8 lb

## 2015-12-10 DIAGNOSIS — H401213 Low-tension glaucoma, right eye, severe stage: Secondary | ICD-10-CM | POA: Diagnosis not present

## 2015-12-10 DIAGNOSIS — H40051 Ocular hypertension, right eye: Secondary | ICD-10-CM | POA: Diagnosis not present

## 2015-12-10 DIAGNOSIS — I739 Peripheral vascular disease, unspecified: Secondary | ICD-10-CM | POA: Diagnosis not present

## 2015-12-10 DIAGNOSIS — H40052 Ocular hypertension, left eye: Secondary | ICD-10-CM | POA: Diagnosis not present

## 2015-12-10 DIAGNOSIS — H401223 Low-tension glaucoma, left eye, severe stage: Secondary | ICD-10-CM | POA: Diagnosis not present

## 2015-12-10 NOTE — Patient Instructions (Signed)
Medication Instructions:  Your physician recommends that you continue on your current medications as directed. Please refer to the Current Medication list given to you today.  Labwork: NONE  Testing/Procedures: Your physician has requested that you have a carotid duplex. This test is an ultrasound of the carotid arteries in your neck. It looks at blood flow through these arteries that supply the brain with blood. Allow one hour for this exam. There are no restrictions or special instructions.  Your physician has requested that you have a lower extremity arterial duplex with ABI. This test is an ultrasound of the arteries in the legs  It looks at arterial blood flow in the legs . Allow one hour for Lower Arterial scans. There are no restrictions or special instructions  Follow-Up: Your physician wants you to follow-up in: 6 months with Dr. Johnsie Cancel. You will receive a reminder letter in the mail two months in advance. If you don't receive a letter, please call our office to schedule the follow-up appointment.  Your physician recommends that you schedule a follow-up appointment with Dr. Gwenlyn Found or Dr. Fletcher Anon.   If you need a refill on your cardiac medications before your next appointment, please call your pharmacy.

## 2015-12-10 NOTE — Progress Notes (Signed)
Patient ID: [name] [name], female   DOB: 1941-07-05, 75 y.o.   MRN: [id number]     Cardiology Office Note   Date:  12/10/2015   ID:  [name] [name], DOB Sep 25, 1940, MRN [id number]  PCP:  Georges Lynch, MD  Cardiologist:   Jenkins Rouge, MD   Chief Complaint  Patient presents with  . Claudication    poss elevated BP      History of Present Illness: [name] [name] is a 75 y.o. female  F/u dyspnea and palpitations    Extensive w/u. Reviewed records. Had reticulonodular pattern on Xray. CT negative for PE.  Has vascular disease. [place] Previous LE stent and no current claudication. Has known carotid bruits and gets US neck every 6 months with no tight lesions. Had stress myovue in hospital 2013  which was normal. Had atypical chest pain post cervical laminectomy  No current pain. Dyspnea improved Radiology indicates need for f/u CT for lung nodules. Primary is [facility] in [place]  Carotid 10/06/13  Plaque no stenosis Echo 10/05/13  EF 55-60% no valve disease   She denies SSCP.  Had been seeing vascular doctor at [facility] / [place] but wants to change. Has mild RLE claudication Quit smoking 3 years ago   01/26/15 ABI"s normal  ? Previous right iliac stent   Recent UTI with course of antibiotics   Past Medical History  Diagnosis Date  . Hypertension   . Heart murmur   . Pneumonia   . Anxiety   . Depression   . Psoriasis   . Arthritis   . Hypercholesteremia   . Congenital absence of one kidney     Pt has right kidney only  . Carpal tunnel syndrome, bilateral   . Chronic kidney disease     CKD stage III, absence of left kidney  . Peripheral vascular disease ([place])     Dr. Benjamine Sprague; right external iliac stent '06 with angioplasty '13  . Polymyalgia rheumatica (HCC)     Past Surgical History  Procedure Laterality Date  . Appendectomy    . Tonsillectomy    . Eye surgery    . Iliac artery stent    . Anterior cervical decomp/discectomy fusion  04/19/2012   Procedure: ANTERIOR CERVICAL DECOMPRESSION/DISCECTOMY FUSION 2 LEVELS;  Surgeon: Winfield Cunas, MD;  Location: [facility];  Service: Neurosurgery;  Laterality: N/A;  Cervical four-five,Cervical five-six anterior cervical decompression with fusion plating and bonegraft possible posterior cervical decompression  . Carpal tunnel release  1990  . Cervical fusion  04/19/2012  . Abdominal hysterectomy  1963    Partial,  Due to bleeding after delivery     Current Outpatient Prescriptions  Medication Sig Dispense Refill  . Aspirin-Salicylamide-Caffeine (BC HEADACHE POWDER PO) Take 1 packet by mouth every 6 (six) hours as needed (pain).    . chlorthalidone (HYGROTON) 25 MG tablet Take 1 tablet (25 mg total) by mouth daily. 30 tablet 11  . Cholecalciferol (VITAMIN D) 2000 UNITS tablet Take 2,000 Units by mouth daily.    . clopidogrel (PLAVIX) 75 MG tablet Take 1 tablet (75 mg total) by mouth every morning. 90 tablet 3  . dorzolamide (TRUSOPT) 2 % ophthalmic solution Place 2 drops into both eyes daily.    . fluticasone (FLONASE) 50 MCG/ACT nasal spray Place 1 spray into both nostrils daily as needed for allergies or rhinitis (congestion). 16 g 11  . latanoprost (XALATAN) 0.005 % ophthalmic solution Place 2 drops into both  eyes daily.    . metoprolol tartrate (LOPRESSOR) 25 MG tablet Take 0.5 tablets (12.5 mg total) by mouth 2 (two) times daily. 30 tablet 1  . Multiple Vitamin (MULTIVITAMIN WITH MINERALS) TABS tablet Take 1 tablet by mouth daily.    . pantoprazole (PROTONIX) 20 MG tablet Take 1 tablet (20 mg total) by mouth daily. 90 tablet 3  . Polyethyl Glycol-Propyl Glycol (SYSTANE OP) Place 1 drop into both eyes 2 (two) times daily.    . sertraline (ZOLOFT) 100 MG tablet Take 1 tablet (100 mg total) by mouth 2 (two) times daily. 60 tablet 11  . sodium chloride (MURO 128) 5 % ophthalmic ointment Place 1 application into both eyes 3 (three) times daily.    . traMADol (ULTRAM) 50 MG tablet Take 2  tablets (100 mg total) by mouth 2 (two) times daily as needed for moderate pain. 60 tablet 2   No current facility-administered medications for this visit.    Allergies:   Cyclobenzaprine and Statins    Social History:  The patient  reports that she quit smoking about 5 years ago. Her smoking use included Cigarettes. She has a 40 pack-year smoking history. She has never used smokeless tobacco. She reports that she does not drink alcohol or use illicit drugs.   Family History:  The patient's family history includes Angina in her mother; Cancer in her sister; Heart attack in her father; Melanoma in her brother.    ROS:  Please see the history of present illness.   Otherwise, review of systems are positive for none.   All other systems are reviewed and negative.    PHYSICAL EXAM: VS:  BP 130/72 mmHg  Pulse 87  Ht 5\' 1"  (1.549 m)  Wt 67.495 kg (148 lb 12.8 oz)  BMI 28.13 kg/m2  SpO2 96% , BMI Body mass index is 28.13 kg/(m^2). Affect appropriate Chronically ill white female  HEENT: normal Neck supple with no adenopathy JVP normal bilateral  bruits no thyromegaly Lungs clear with no wheezing and good diaphragmatic motion Heart:  S1/S2 AV sclerosis  murmur, no rub, gallop or click PMI normal Abdomen: benighn, BS positve, no tenderness, no AAA right femoral bruit  Bilateral femoral  bruit.  No HSM or HJR Distal pulses intact with no bruits No edema Neuro non-focal Chronic venous changes in LE;s  No muscular weakness    EKG:    10/20/14  SR rate 76 normal   Recent Labs: 10/18/2015: ALT 10; BUN 16; Creat 1.24*; Hemoglobin 13.3; Platelets 239; Potassium 3.4*; Sodium 138; TSH 0.88    Lipid Panel    Component Value Date/Time   CHOL 231* 08/03/2015 1148   TRIG 166* 08/03/2015 1148   HDL 46 08/03/2015 1148   CHOLHDL 5.0 08/03/2015 1148   VLDL 33* 08/03/2015 1148   LDLCALC 152* 08/03/2015 1148      Wt Readings from Last 3 Encounters:  12/10/15 67.495 kg (148 lb 12.8 oz)    10/26/15 66.225 kg (146 lb)  10/18/15 66.316 kg (146 lb 3.2 oz)      Other studies Reviewed: Additional studies/ records that were reviewed today include: Records from [facility] notes and echo .    ASSESSMENT AND PLAN:  1.  Bruit:  F/U duplex last one 2015 with plaque no stenosis ASA/Plavix 2. PVD mild RLE claudication ? History of iliac stent.  ABI's normal 2016  Will order ABI/LE Art duplex And have here see Dr Fletcher Anon or Gwenlyn Found 3. HTN:  Well controlled.  Continue current medications and low sodium Dash type diet.   4. Chol:  Intolerant to statins does not want referral for praluant  5. Benign ECG ok EF normal by echo last year with PVD no beta blocker at this time 6. UTI:  Symptoms resolved post antibiotics f/u UA primary   Current medicines are reviewed at length with the patient today.  The patient does not have concerns regarding medicines.  The following changes have been made:  no change  Labs/ tests ordered today include: ABI LE arterial duplex and Carotid  No orders of the defined types were placed in this encounter.     Disposition:   FU with me in 6 months refer PV Arida/Berry     Signed, Jenkins Rouge, MD  12/10/2015 2:37 PM    [facility] [place], [place], [place]  [address] Phone: [phone]; Fax: [phone]

## 2015-12-14 ENCOUNTER — Encounter: Payer: Self-pay | Admitting: Physical Therapy

## 2015-12-14 ENCOUNTER — Ambulatory Visit: Payer: PPO | Admitting: Physical Therapy

## 2015-12-14 DIAGNOSIS — M6281 Muscle weakness (generalized): Secondary | ICD-10-CM

## 2015-12-14 DIAGNOSIS — M79604 Pain in right leg: Secondary | ICD-10-CM

## 2015-12-14 NOTE — Therapy (Signed)
[place], [place], [address] Phone: [phone]   Fax:  [phone]  Physical Therapy Treatment  Patient Details  Name: [name] [name] MRN: [id number] Date of Birth: [birth year] Referring Provider: Andria Frames  Encounter Date: 12/14/2015      PT End of Session - 12/14/15 1429    Visit Number 8   Date for PT Re-Evaluation 01/09/16   PT Start Time 1347   PT Stop Time 1443   PT Time Calculation (min) 56 min   Activity Tolerance Patient tolerated treatment well   Behavior During Therapy [facility] for tasks assessed/performed      Past Medical History  Diagnosis Date  . Hypertension   . Heart murmur   . Pneumonia   . Anxiety   . Depression   . Psoriasis   . Arthritis   . Hypercholesteremia   . Congenital absence of one kidney     Pt has right kidney only  . Carpal tunnel syndrome, bilateral   . Chronic kidney disease     CKD stage III, absence of left kidney  . Peripheral vascular disease ([place])     Dr. Benjamine Sprague; right external iliac stent '06 with angioplasty '13  . Polymyalgia rheumatica (HCC)     Past Surgical History  Procedure Laterality Date  . Appendectomy    . Tonsillectomy    . Eye surgery    . Iliac artery stent    . Anterior cervical decomp/discectomy fusion  04/19/2012    Procedure: ANTERIOR CERVICAL DECOMPRESSION/DISCECTOMY FUSION 2 LEVELS;  Surgeon: Winfield Cunas, MD;  Location: [facility];  Service: Neurosurgery;  Laterality: N/A;  Cervical four-five,Cervical five-six anterior cervical decompression with fusion plating and bonegraft possible posterior cervical decompression  . Carpal tunnel release  1990  . Cervical fusion  04/19/2012  . Abdominal hysterectomy  1963    Partial,  Due to bleeding after delivery    There were no vitals filed for this visit.      Subjective Assessment - 12/14/15 1355    Subjective "My legs feel a whole lot stronger"   Currently in Pain? Yes    Pain Score 4    Pain Location Back                         [facility] Adult PT Treatment/Exercise - 12/14/15 0001    Ambulation/Gait   Gait Comments amb around bldg with 1 stadning rest break and 1 seated rest break, side to side sway and narrow BOS. Poor quad strength for going up and down curbs.   Lumbar Exercises: Aerobic   Elliptical Nustep L 5 7 min   Lumbar Exercises: Machines for Strengthening   Other Lumbar Machine Exercise seated row 20 # 2 sets 10   Knee/Hip Exercises: Machines for Strengthening   Cybex Leg Press 30# 2 sets 15   Knee/Hip Exercises: Standing   Forward Step Up Both;1 set;10 reps;Step Height: 6"   Walking with Sports Cord 20lb 4 way  reps each   Moist Heat Therapy   Number Minutes Moist Heat 15 Minutes   Moist Heat Location Lumbar Spine   Electrical Stimulation   Electrical Stimulation Location lumbar area   Electrical Stimulation Action IFC   Electrical Stimulation Goals Pain                  PT Short Term Goals - 11/23/15 1434    PT SHORT TERM  GOAL #1   Title independent iwth initial HEP   Baseline doing exercises from HHPT   Status Achieved           PT Long Term Goals - 12/14/15 1408    PT LONG TERM GOAL #2   Title decrease pain 25%   Status On-going   PT LONG TERM GOAL #3   Title walk 180 feet without rest   Status Achieved   PT LONG TERM GOAL #5   Title no falls over past 4 weeks   Status On-going               Plan - 12/14/15 1434    Clinical Impression Statement Pt continues's to be wobbly with outdoor ambulation with decrease quad strength with curbs. Despite having difficulty with outdoor curbs shows good control with step ups on 4 inch box   Rehab Potential Good   PT Frequency 2x / week   PT Duration 8 weeks   PT Treatment/Interventions ADLs/Self Care Home Management;Electrical Stimulation;Cryotherapy;Moist Heat;Therapeutic exercise;Therapeutic activities;Functional mobility training;Gait  training;Ultrasound;Balance training;Neuromuscular re-education;Patient/family education;Manual techniques   PT Next Visit Plan endurance/balance and strength      Patient will benefit from skilled therapeutic intervention in order to improve the following deficits and impairments:  Abnormal gait, Decreased activity tolerance, Decreased mobility, Decreased endurance, Decreased range of motion, Difficulty walking, Decreased strength, Impaired flexibility, Improper body mechanics, Postural dysfunction, Pain  Visit Diagnosis: Pain In Right Leg  Muscle weakness (generalized)     Problem List Patient Active Problem List   Diagnosis Date Noted  . Other fatigue 10/18/2015  . Right knee pain 10/18/2015  . Acrochordon 08/03/2015  . Stasis dermatitis of both legs 01/18/2015  . Radiculopathy of lumbar region 01/06/2015  . Seborrheic keratosis 11/24/2014  . OSA (obstructive sleep apnea) 09/21/2014  . Osteoporosis 06/29/2014  . Thyroid nodule 06/29/2014  . Breast calcifications 06/29/2014  . Glaucoma 01/08/2014  . Trigeminal neuralgia 12/18/2013  . Basal cell carcinoma 11/12/2013  . Presbyopia 10/22/2013  . Resting tremor 10/14/2013  . Hearing loss 10/13/2013  . Polymyalgia rheumatica ([place]) 09/11/2013  . GERD (gastroesophageal reflux disease) 09/11/2013  . Pulmonary nodules 05/24/2012  . PVD (peripheral vascular disease) ([place]) 04/30/2012  . Carotid bruit 04/30/2012  . Hypertension 04/27/2012  . Anxiety and depression 04/27/2012  . Hyperlipemia 04/27/2012  . Cervical spondylosis with radiculopathy 04/19/2012    Scot Jun, PTA  12/14/2015, 2:37 PM  [place], [place], [address] Phone: [phone]   Fax:  [phone]  Name: [name] [name] MRN: [id number] Date of Birth: 18-Jul-1940

## 2015-12-16 ENCOUNTER — Encounter: Payer: Self-pay | Admitting: Physical Therapy

## 2015-12-16 ENCOUNTER — Ambulatory Visit: Payer: PPO | Admitting: Physical Therapy

## 2015-12-16 DIAGNOSIS — M6281 Muscle weakness (generalized): Secondary | ICD-10-CM | POA: Diagnosis not present

## 2015-12-16 DIAGNOSIS — M79604 Pain in right leg: Secondary | ICD-10-CM

## 2015-12-16 NOTE — Therapy (Addendum)
[place], [place], [address] Phone: [phone]   Fax:  [phone]  Physical Therapy Treatment  Patient Details  Name: [name] [name] MRN: [id number] Date of Birth: 04-15-1941 Referring Provider: Andria Frames  Encounter Date: 12/16/2015      PT End of Session - 12/16/15 1433    Visit Number 9   Date for PT Re-Evaluation 01/09/16   PT Start Time 3785   PT Stop Time 1445   PT Time Calculation (min) 57 min   Activity Tolerance Patient tolerated treatment well      Past Medical History  Diagnosis Date  . Hypertension   . Heart murmur   . Pneumonia   . Anxiety   . Depression   . Psoriasis   . Arthritis   . Hypercholesteremia   . Congenital absence of one kidney     Pt has right kidney only  . Carpal tunnel syndrome, bilateral   . Chronic kidney disease     CKD stage III, absence of left kidney  . Peripheral vascular disease ([place])     Dr. Benjamine Sprague; right external iliac stent '06 with angioplasty '13  . Polymyalgia rheumatica (HCC)     Past Surgical History  Procedure Laterality Date  . Appendectomy    . Tonsillectomy    . Eye surgery    . Iliac artery stent    . Anterior cervical decomp/discectomy fusion  04/19/2012    Procedure: ANTERIOR CERVICAL DECOMPRESSION/DISCECTOMY FUSION 2 LEVELS;  Surgeon: Winfield Cunas, MD;  Location: [facility];  Service: Neurosurgery;  Laterality: N/A;  Cervical four-five,Cervical five-six anterior cervical decompression with fusion plating and bonegraft possible posterior cervical decompression  . Carpal tunnel release  1990  . Cervical fusion  04/19/2012  . Abdominal hysterectomy  1963    Partial,  Due to bleeding after delivery    There were no vitals filed for this visit.      Subjective Assessment - 12/16/15 1356    Subjective Pt reports that she walked around the park this morning and her L knee started swelling in the area she got her injection. "I  put my knee brace on"   Currently in Pain? Yes   Pain Score 6    Pain Location Knee   Pain Orientation Left                         [facility] Adult PT Treatment/Exercise - 12/16/15 0001    High Level Balance   High Level Balance Activities Side stepping;Negotiating over obstacles;Backward walking   Lumbar Exercises: Aerobic   Elliptical Nustep L 5 7 min   Lumbar Exercises: Machines for Strengthening   Other Lumbar Machine Exercise seated row 20 # 2 sets 15   Knee/Hip Exercises: Machines for Strengthening   Cybex Knee Flexion 25# 2 sets 15   Cybex Leg Press 30# 2 sets 15   Knee/Hip Exercises: Standing   Walking with Sports Cord 20lb 4 way 2 reps each   Modalities   Modalities Cryotherapy;Electrical Stimulation   Cryotherapy   Number Minutes Cryotherapy 15 Minutes   Cryotherapy Location Knee   Type of Cryotherapy Ice pack   Electrical Stimulation   Electrical Stimulation Location L knee   Electrical Stimulation Action IFC   Electrical Stimulation Goals Pain                  PT Short Term Goals - 11/23/15  [place] #1   Title independent iwth initial HEP   Baseline doing exercises from HHPT   Status Achieved           PT Long Term Goals - 12/16/15 1415    PT LONG TERM GOAL #5   Title no falls over past 4 weeks   Status On-going               Plan - 12/16/15 1434    Clinical Impression Statement Pt ambulated in clinic with knee brace on her LLE. Pt stated that he knee started to swell after her walk this morning. no issues with standing balance activities but does have L knee pain with leg press. Tolerated additional repetitions with UE strengthening interventions.   Rehab Potential Good   PT Frequency 2x / week   PT Treatment/Interventions ADLs/Self Care Home Management;Electrical Stimulation;Cryotherapy;Moist Heat;Therapeutic exercise;Therapeutic activities;Functional mobility training;Gait training;Ultrasound;Balance  training;Neuromuscular re-education;Patient/family education;Manual techniques   PT Next Visit Plan endurance/balance and strength      Patient will benefit from skilled therapeutic intervention in order to improve the following deficits and impairments:  Abnormal gait, Decreased activity tolerance, Decreased mobility, Decreased endurance, Decreased range of motion, Difficulty walking, Decreased strength, Impaired flexibility, Improper body mechanics, Postural dysfunction, Pain  Visit Diagnosis: Pain In Right Leg  Muscle weakness (generalized)     Problem List Patient Active Problem List   Diagnosis Date Noted  . Other fatigue 10/18/2015  . Right knee pain 10/18/2015  . Acrochordon 08/03/2015  . Stasis dermatitis of both legs 01/18/2015  . Radiculopathy of lumbar region 01/06/2015  . Seborrheic keratosis 11/24/2014  . OSA (obstructive sleep apnea) 09/21/2014  . Osteoporosis 06/29/2014  . Thyroid nodule 06/29/2014  . Breast calcifications 06/29/2014  . Glaucoma 01/08/2014  . Trigeminal neuralgia 12/18/2013  . Basal cell carcinoma 11/12/2013  . Presbyopia 10/22/2013  . Resting tremor 10/14/2013  . Hearing loss 10/13/2013  . Polymyalgia rheumatica ([place]) 09/11/2013  . GERD (gastroesophageal reflux disease) 09/11/2013  . Pulmonary nodules 05/24/2012  . PVD (peripheral vascular disease) ([place]) 04/30/2012  . Carotid bruit 04/30/2012  . Hypertension 04/27/2012  . Anxiety and depression 04/27/2012  . Hyperlipemia 04/27/2012  . Cervical spondylosis with radiculopathy 04/19/2012    PHYSICAL THERAPY DISCHARGE SUMMARY  Visits from Start of Care: 9   Plan: Patient agrees to discharge.  Patient goals were partially met. Patient is being discharged due to a change in medical status.  ?????        Scot Jun, PTA  12/16/2015, 2:36 PM  [place] [address] [place], [place], [address] Phone: [phone]    Fax:  [phone]  Name: GLINDA NATZKE MRN: [id number] Date of Birth: 1941-02-23

## 2015-12-17 ENCOUNTER — Ambulatory Visit: Payer: PPO | Admitting: Family Medicine

## 2015-12-21 ENCOUNTER — Ambulatory Visit: Payer: PPO | Admitting: Physical Therapy

## 2015-12-23 ENCOUNTER — Telehealth: Payer: Self-pay

## 2015-12-23 DIAGNOSIS — H401213 Low-tension glaucoma, right eye, severe stage: Secondary | ICD-10-CM | POA: Diagnosis not present

## 2015-12-24 ENCOUNTER — Ambulatory Visit: Payer: PPO | Admitting: Cardiovascular Disease

## 2015-12-28 ENCOUNTER — Other Ambulatory Visit: Payer: Self-pay | Admitting: Cardiovascular Disease

## 2015-12-28 DIAGNOSIS — I700 Atherosclerosis of aorta: Secondary | ICD-10-CM

## 2016-01-03 ENCOUNTER — Inpatient Hospital Stay (HOSPITAL_COMMUNITY): Admission: RE | Admit: 2016-01-03 | Payer: PPO | Source: Ambulatory Visit

## 2016-01-03 ENCOUNTER — Encounter (HOSPITAL_COMMUNITY): Payer: PPO

## 2016-01-07 ENCOUNTER — Ambulatory Visit (HOSPITAL_COMMUNITY)
Admission: RE | Admit: 2016-01-07 | Discharge: 2016-01-07 | Disposition: A | Discharge status: Home / Self Care | Payer: PPO | Source: Ambulatory Visit | Attending: Cardiovascular Disease | Admitting: Cardiovascular Disease

## 2016-01-07 DIAGNOSIS — E785 Hyperlipidemia, unspecified: Secondary | ICD-10-CM | POA: Insufficient documentation

## 2016-01-07 DIAGNOSIS — I6523 Occlusion and stenosis of bilateral carotid arteries: Secondary | ICD-10-CM | POA: Diagnosis not present

## 2016-01-07 DIAGNOSIS — I10 Essential (primary) hypertension: Secondary | ICD-10-CM | POA: Diagnosis not present

## 2016-01-07 DIAGNOSIS — I700 Atherosclerosis of aorta: Secondary | ICD-10-CM | POA: Insufficient documentation

## 2016-01-07 DIAGNOSIS — I739 Peripheral vascular disease, unspecified: Secondary | ICD-10-CM | POA: Diagnosis not present

## 2016-01-07 DIAGNOSIS — I70203 Unspecified atherosclerosis of native arteries of extremities, bilateral legs: Secondary | ICD-10-CM | POA: Insufficient documentation

## 2016-01-07 DIAGNOSIS — Z87891 Personal history of nicotine dependence: Secondary | ICD-10-CM | POA: Diagnosis not present

## 2016-01-07 DIAGNOSIS — Z95828 Presence of other vascular implants and grafts: Secondary | ICD-10-CM | POA: Diagnosis not present

## 2016-01-10 ENCOUNTER — Encounter: Payer: Self-pay | Admitting: Family Medicine

## 2016-01-10 ENCOUNTER — Ambulatory Visit (INDEPENDENT_AMBULATORY_CARE_PROVIDER_SITE_OTHER): Payer: PPO | Admitting: Family Medicine

## 2016-01-10 VITALS — BP 154/78 | HR 92 | Temp 97.9°F | Ht 61.0 in | Wt 150.0 lb

## 2016-01-10 DIAGNOSIS — I10 Essential (primary) hypertension: Secondary | ICD-10-CM

## 2016-01-10 DIAGNOSIS — M4722 Other spondylosis with radiculopathy, cervical region: Secondary | ICD-10-CM | POA: Diagnosis not present

## 2016-01-10 DIAGNOSIS — Z79899 Other long term (current) drug therapy: Secondary | ICD-10-CM

## 2016-01-10 MED ORDER — LISINOPRIL 10 MG PO TABS: 10.0000 mg | ORAL_TABLET | Freq: Every day | ORAL | Status: DC

## 2016-01-10 MED ORDER — METOPROLOL TARTRATE 25 MG PO TABS: 12.5000 mg | ORAL_TABLET | Freq: Two times a day (BID) | ORAL | Status: DC

## 2016-01-10 MED ORDER — TRAMADOL HCL 50 MG PO TABS: 100.0000 mg | ORAL_TABLET | Freq: Two times a day (BID) | ORAL | Status: DC | PRN

## 2016-01-10 NOTE — Assessment & Plan Note (Signed)
Adequately treated with Ultram. - Medication refill.

## 2016-01-10 NOTE — Patient Instructions (Signed)
It was a pleasure seeing you today in our clinic. Today we discussed your medications. Here is the treatment plan we have discussed and agreed upon together:   - I've refilled your necessary medications at this time. - I would like for you to consider the possibility of using I see Pap for sleep apnea. I understand that you have had issues with claustrophobia in the past so I will not order a sleep study until you feel as though this would be tolerable. - I've started you on any medication. Lisinopril, and ACE inhibitor. Take this 1 time daily. - I need for you to come back in for a lab in 1 week. This is to make sure that this new medication is not having a negative impact on your kidney function, which is obviously much more worrisome with you on a having one kidney.

## 2016-01-10 NOTE — Progress Notes (Signed)
   HPI  CC: Medication refill Patient is here for medication refill on her tramadol. She states that she has had good symptomatic relief with this medication and does not desire any changes at this time. Symptoms become relatively difficult to tolerate if medication is not taken.  Hypertension: Patient's blood pressure is been steadily increasing over the past few visits. She states that she has been very compliant with her medications. She endorses some symptoms of headache and palpitations. These symptoms are relieved after taking her Lopressor. She denies any vision changes shortness of breath, chest pain, nausea, vomiting, left arm pain or weakness, or diaphoresis.  Review of Systems   See HPI for ROS. All other systems reviewed and are negative.  CC, SH/smoking status, and VS noted  Objective: BP 154/78 mmHg  Pulse 92  Temp(Src) 97.9 F (36.6 C) (Oral)  Ht 5\' 1"  (1.549 m)  Wt 150 lb (68.04 kg)  BMI 28.36 kg/m2 Gen: NAD, alert, cooperative, and pleasant. HEENT: NCAT, EOMI, PERRL CV: RRR, no murmur Resp: CTAB, no wheezes, non-labored  Assessment and plan:  Hypertension Worsening: Started lisinopril 10 mg daily today. - I believe some of this may have to do with her untreated OSA. We had further discussion of whether or not patient believes she could tolerate before meals pap at night. She states that she does not feel she could tolerate this due to anxiety and claustrophobia. Ultimately I believe this would be the most appropriate next step in her management but I do not believe patient would be compliant with this medication and according to patient, neither does she. - Nursing BP check in one week - BMP one week to assess potassium and creatinine responses patient only has one kidney and is at high risk for developing ESRD.  Cervical spondylosis with radiculopathy Adequately treated with Ultram. - Medication refill.    Orders Placed This Encounter  Procedures  . BASIC  METABOLIC PANEL WITH GFR    Standing Status: Future     Number of Occurrences:      Standing Expiration Date: 01/09/2017    Meds ordered this encounter  Medications  . traMADol (ULTRAM) 50 MG tablet    Sig: Take 2 tablets (100 mg total) by mouth 2 (two) times daily as needed for moderate pain.    Dispense:  60 tablet    Refill:  2  . metoprolol tartrate (LOPRESSOR) 25 MG tablet    Sig: Take 0.5 tablets (12.5 mg total) by mouth 2 (two) times daily.    Dispense:  30 tablet    Refill:  11  . lisinopril (PRINIVIL,ZESTRIL) 10 MG tablet    Sig: Take 1 tablet (10 mg total) by mouth daily.    Dispense:  90 tablet    Refill:  3     Elberta Leatherwood, MD,MS,  PGY3 01/10/2016 7:12 PM

## 2016-01-10 NOTE — Assessment & Plan Note (Addendum)
Worsening: Started lisinopril 10 mg daily today. - I believe some of this may have to do with her untreated OSA. We had further discussion of whether or not patient believes she could tolerate before meals pap at night. She states that she does not feel she could tolerate this due to anxiety and claustrophobia. Ultimately I believe this would be the most appropriate next step in her management but I do not believe patient would be compliant with this medication and according to patient, neither does she. - Nursing BP check in one week - BMP one week to assess potassium and creatinine responses patient only has one kidney and is at high risk for developing ESRD.

## 2016-01-13 DIAGNOSIS — H401223 Low-tension glaucoma, left eye, severe stage: Secondary | ICD-10-CM | POA: Diagnosis not present

## 2016-01-17 ENCOUNTER — Ambulatory Visit (INDEPENDENT_AMBULATORY_CARE_PROVIDER_SITE_OTHER): Payer: PPO | Admitting: *Deleted

## 2016-01-17 ENCOUNTER — Other Ambulatory Visit: Payer: PPO

## 2016-01-17 VITALS — BP 130/78 | HR 97

## 2016-01-17 DIAGNOSIS — Z136 Encounter for screening for cardiovascular disorders: Secondary | ICD-10-CM

## 2016-01-17 DIAGNOSIS — Z79899 Other long term (current) drug therapy: Secondary | ICD-10-CM | POA: Diagnosis not present

## 2016-01-17 DIAGNOSIS — Z0130 Encounter for examination of blood pressure without abnormal findings: Secondary | ICD-10-CM

## 2016-01-17 LAB — BASIC METABOLIC PANEL WITH GFR
BUN: 27 mg/dL — ABNORMAL HIGH (ref 7–25)
CO2: 24 mmol/L (ref 20–31)
Calcium: 9 mg/dL (ref 8.6–10.4)
Chloride: 106 mmol/L (ref 98–110)
Creat: 1.36 mg/dL — ABNORMAL HIGH (ref 0.60–0.93)
GFR, Est African American: 44 mL/min — ABNORMAL LOW (ref >=60)
GFR, Est Non African American: 38 mL/min — ABNORMAL LOW (ref >=60)
Glucose, Bld: 84 mg/dL (ref 65–99)
Potassium: 4 mmol/L (ref 3.5–5.3)
Sodium: 140 mmol/L (ref 135–146)

## 2016-01-17 NOTE — Progress Notes (Signed)
   Patient in nurse clinic for blood pressure check and lab work today.  Patient denies any chest pain, SOB, dizziness, headache.  Patient reported taking her blood pressure medications as prescribed.  Will forward to PCP.  Derl Barrow, RN   Today's Vitals   01/17/16 1003  BP: 130/78  Pulse: 97  SpO2: 97%  PainSc: 0-No pain

## 2016-01-25 ENCOUNTER — Other Ambulatory Visit: Payer: Self-pay | Admitting: *Deleted

## 2016-01-25 ENCOUNTER — Encounter: Payer: Self-pay | Admitting: Cardiovascular Disease

## 2016-01-25 ENCOUNTER — Ambulatory Visit (INDEPENDENT_AMBULATORY_CARE_PROVIDER_SITE_OTHER): Payer: PPO | Admitting: Cardiovascular Disease

## 2016-01-25 VITALS — BP 141/80 | HR 75 | Ht 61.0 in | Wt 148.6 lb

## 2016-01-25 DIAGNOSIS — D689 Coagulation defect, unspecified: Secondary | ICD-10-CM | POA: Diagnosis not present

## 2016-01-25 DIAGNOSIS — Z01818 Encounter for other preprocedural examination: Secondary | ICD-10-CM

## 2016-01-25 DIAGNOSIS — I739 Peripheral vascular disease, unspecified: Secondary | ICD-10-CM | POA: Diagnosis not present

## 2016-01-25 NOTE — Assessment & Plan Note (Signed)
[name] [name] has a history of right lower extreme family stenting by Dr. Benjamine Sprague September 2008. She has been seen by Dr. Maryjean Morn in [facility] 07/17/13. She complains of progressive left lower extremity claudication. Recent Dopplers performed 01/07/16 show high-grade left common and external iliac artery disease. Based on this, we decided to proceed with angiography and potential intervention.

## 2016-01-25 NOTE — Progress Notes (Signed)
01/25/2016 [name] [name]   07-Jul-1941  [id number]  Primary Physician [name] Lynch, MD Primary Cardiologist: [name] Harp MD [name] [name]  HPI:  [name] [name] is a very pleasant 75 year old mild to moderately overweight Caucasian female mother of 2 children referred by [name] [name] for peripheral vascular evaluation. She has a history of treated hypertension and hyperlipidemia. Risk factors also include 25-pack-years of tobacco abuse having quit 08/23/10. She has never had a heart attack or stroke. She has had a right iliac stent placed in 2008 by [name] [name] and has been further evaluated by [name] [name] in [facility] in 2015. She has complained of progressive left lower extreme claudication. Recent Dopplers in the office performed 01/07/16 revealed fairly normal ABIs although she had high-frequency signals and left common and external iliac arteries.   Current Outpatient Prescriptions  Medication Sig Dispense Refill  . Aspirin-Salicylamide-Caffeine (BC HEADACHE POWDER PO) Take 1 packet by mouth every 6 (six) hours as needed (pain).    . chlorthalidone (HYGROTON) 25 MG tablet Take 1 tablet (25 mg total) by mouth daily. 30 tablet 11  . Cholecalciferol (VITAMIN D) 2000 UNITS tablet Take 2,000 Units by mouth daily.    . clopidogrel (PLAVIX) 75 MG tablet Take 1 tablet (75 mg total) by mouth every morning. 90 tablet 3  . dorzolamide (TRUSOPT) 2 % ophthalmic solution Place 2 drops into both eyes daily.    . fluticasone (FLONASE) 50 MCG/ACT nasal spray Place 1 spray into both nostrils daily as needed for allergies or rhinitis (congestion). 16 g 11  . latanoprost (XALATAN) 0.005 % ophthalmic solution Place 2 drops into both eyes daily.    [name] lisinopril (PRINIVIL,ZESTRIL) 10 MG tablet Take 1 tablet (10 mg total) by mouth daily. 90 tablet 3  . metoprolol tartrate (LOPRESSOR) 25 MG tablet Take 0.5 tablets (12.5 mg total) by mouth 2 (two) times daily. 30 tablet 11  . Multiple Vitamin (MULTIVITAMIN WITH  MINERALS) TABS tablet Take 1 tablet by mouth daily.    . pantoprazole (PROTONIX) 20 MG tablet Take 1 tablet (20 mg total) by mouth daily. 90 tablet 3  . Polyethyl Glycol-Propyl Glycol (SYSTANE OP) Place 1 drop into both eyes 2 (two) times daily.    . sertraline (ZOLOFT) 100 MG tablet Take 1 tablet (100 mg total) by mouth 2 (two) times daily. 60 tablet 11  . sodium chloride (MURO 128) 5 % ophthalmic ointment Place 1 application into both eyes 3 (three) times daily.    . traMADol (ULTRAM) 50 MG tablet Take 2 tablets (100 mg total) by mouth 2 (two) times daily as needed for moderate pain. 60 tablet 2   No current facility-administered medications for this visit.    Allergies  Allergen Reactions  . Cyclobenzaprine Itching    Possible reaction per pt  . Statins Swelling, Rash and Other (See Comments)    Swelling involving tongue; also causes muscle pain    Social History   Social History  . Marital Status: Widowed    Spouse Name: N/A  . Number of Children: N/A  . Years of Education: N/A   Occupational History  . Not on file.   Social History Main Topics  . Smoking status: Former Smoker -- 1.00 packs/day for 40 years    Types: Cigarettes    Quit date: 07/24/2010  . Smokeless tobacco: Never Used     Comment: STARTED BACK JULY 2013 AND RECENTLY QUIT 03-10-2012  . Alcohol Use: No  Comment: QUIT IN 64   . Drug Use: No  . Sexual Activity: No     Comment: 1st intercourse 75 yo-Fewer than 5 partners   Other Topics Concern  . Not on file   Social History Narrative     Review of Systems: General: negative for chills, fever, night sweats or weight changes.  Cardiovascular: negative for chest pain, dyspnea on exertion, edema, orthopnea, palpitations, paroxysmal nocturnal dyspnea or shortness of breath Dermatological: negative for rash Respiratory: negative for cough or wheezing Urologic: negative for hematuria Abdominal: negative for nausea, vomiting, diarrhea, bright red blood  per rectum, melena, or hematemesis Neurologic: negative for visual changes, syncope, or dizziness All other systems reviewed and are otherwise negative except as noted above.    Blood pressure 141/80, pulse 75, height 5\' 1"  (1.549 m), weight 148 lb 9.6 oz (67.405 kg).  General appearance: alert and no distress Neck: no adenopathy, no JVD, supple, symmetrical, trachea midline, thyroid not enlarged, symmetric, no tenderness/mass/nodules and Soft right carotid bruit Lungs: clear to auscultation bilaterally Heart: regular rate and rhythm, S1, S2 normal, no murmur, click, rub or gallop Extremities: extremities normal, atraumatic, no cyanosis or edema  EKG normal sinus rhythm at 75 without ST or T-wave changes. I personally reviewed this EKG  ASSESSMENT AND PLAN:   PVD (peripheral vascular disease) [name] [name] has a history of right lower extreme family stenting by [name] [name] September 2008. She has been seen by [name] [name] in [facility] 07/17/13. She complains of progressive left lower extremity claudication. Recent Dopplers performed 01/07/16 show high-grade left common and external iliac artery disease. Based on this, we decided to proceed with angiography and potential intervention.      [name] Harp MD FACP,FACC,FAHA, [facility] 01/25/2016 12:08 PM

## 2016-01-25 NOTE — Patient Instructions (Signed)
Medication Instructions:  Your physician recommends that you continue on your current medications as directed. Please refer to the Current Medication list given to you today.  LAB: Your physician recommends that you return for lab work in: [place].   Testing/Procedures: Dr. Gwenlyn Found has ordered a peripheral angiogram to be done at [facility].  This procedure is going to look at the bloodflow in your lower extremities.  If Dr. Gwenlyn Found is able to open up the arteries, you will have to spend one night in the hospital.  If he is not able to open the arteries, you will be able to go home that same day.    After the procedure, you will not be allowed to drive for 3 days or push, pull, or lift anything greater than 10 lbs for one week.    You will be required to have the following tests prior to the procedure:  1. Blood work-the blood work can be done no more than 14 days prior to the procedure.  It can be done at any [facility] lab.  There is one downstairs on the first floor of this building and one in the [facility] building [phone] [address])   *REPS NONE per Dr Gwenlyn Found  Puncture site left groin    Follow-Up: You will be scheduled for a follow up appointment after your procedure.  Any Other Special Instructions Will Be Listed Below (If Applicable).     If you need a refill on your cardiac medications before your next appointment, please call your pharmacy.

## 2016-01-26 LAB — CBC WITH DIFFERENTIAL/PLATELET
Basophils Absolute: 0 cells/uL (ref 0–200)
Basophils Relative: 0 %
Eosinophils Absolute: 171 cells/uL (ref 15–500)
Eosinophils Relative: 3 %
HCT: 39 % (ref 35.0–45.0)
Hemoglobin: 13 g/dL (ref 11.7–15.5)
Lymphocytes Relative: 31 %
Lymphs Abs: 1767 cells/uL (ref 850–3900)
MCH: 32.7 pg (ref 27.0–33.0)
MCHC: 33.3 g/dL (ref 32.0–36.0)
MCV: 98 fL (ref 80.0–100.0)
MPV: 10.2 fL (ref 7.5–12.5)
Monocytes Absolute: 570 cells/uL (ref 200–950)
Monocytes Relative: 10 %
Neutro Abs: 3192 cells/uL (ref 1500–7800)
Neutrophils Relative %: 56 %
Platelets: 184 10*3/uL (ref 140–400)
RBC: 3.98 MIL/uL (ref 3.80–5.10)
RDW: 14.2 % (ref 11.0–15.0)
WBC: 5.7 10*3/uL (ref 3.8–10.8)

## 2016-01-26 LAB — BASIC METABOLIC PANEL
BUN: 23 mg/dL (ref 7–25)
CO2: 25 mmol/L (ref 20–31)
Calcium: 9.4 mg/dL (ref 8.6–10.4)
Chloride: 106 mmol/L (ref 98–110)
Creat: 1.32 mg/dL — ABNORMAL HIGH (ref 0.60–0.93)
Glucose, Bld: 82 mg/dL (ref 65–99)
Potassium: 4.7 mmol/L (ref 3.5–5.3)
Sodium: 140 mmol/L (ref 135–146)

## 2016-01-27 LAB — APTT: aPTT: 28 s (ref 22–34)

## 2016-01-27 LAB — PROTIME-INR
INR: 1
Prothrombin Time: 10.9 s (ref 9.0–11.5)

## 2016-01-31 ENCOUNTER — Encounter (HOSPITAL_COMMUNITY): Payer: Self-pay | Admitting: Cardiovascular Disease

## 2016-01-31 ENCOUNTER — Encounter (HOSPITAL_COMMUNITY)
Admission: RE | Disposition: A | Discharge status: Home / Self Care | Payer: Self-pay | Source: Ambulatory Visit | Attending: Cardiovascular Disease

## 2016-01-31 ENCOUNTER — Ambulatory Visit (HOSPITAL_COMMUNITY)
Admission: RE | Admit: 2016-01-31 | Discharge: 2016-02-01 | Disposition: A | Discharge status: Home / Self Care | Payer: PPO | Source: Ambulatory Visit | Attending: Cardiovascular Disease | Admitting: Cardiovascular Disease

## 2016-01-31 DIAGNOSIS — Z7951 Long term (current) use of inhaled steroids: Secondary | ICD-10-CM | POA: Diagnosis not present

## 2016-01-31 DIAGNOSIS — G4733 Obstructive sleep apnea (adult) (pediatric): Secondary | ICD-10-CM | POA: Diagnosis present

## 2016-01-31 DIAGNOSIS — I10 Essential (primary) hypertension: Secondary | ICD-10-CM | POA: Diagnosis not present

## 2016-01-31 DIAGNOSIS — E785 Hyperlipidemia, unspecified: Secondary | ICD-10-CM | POA: Insufficient documentation

## 2016-01-31 DIAGNOSIS — Z7982 Long term (current) use of aspirin: Secondary | ICD-10-CM | POA: Insufficient documentation

## 2016-01-31 DIAGNOSIS — I739 Peripheral vascular disease, unspecified: Secondary | ICD-10-CM | POA: Diagnosis present

## 2016-01-31 DIAGNOSIS — K219 Gastro-esophageal reflux disease without esophagitis: Secondary | ICD-10-CM | POA: Diagnosis present

## 2016-01-31 DIAGNOSIS — I70212 Atherosclerosis of native arteries of extremities with intermittent claudication, left leg: Secondary | ICD-10-CM | POA: Diagnosis not present

## 2016-01-31 DIAGNOSIS — Z7902 Long term (current) use of antithrombotics/antiplatelets: Secondary | ICD-10-CM | POA: Diagnosis not present

## 2016-01-31 DIAGNOSIS — Z87891 Personal history of nicotine dependence: Secondary | ICD-10-CM | POA: Diagnosis not present

## 2016-01-31 DIAGNOSIS — E782 Mixed hyperlipidemia: Secondary | ICD-10-CM | POA: Diagnosis present

## 2016-01-31 HISTORY — DX: Headache: R51

## 2016-01-31 HISTORY — DX: Gastro-esophageal reflux disease without esophagitis: K21.9

## 2016-01-31 HISTORY — PX: PERIPHERAL VASCULAR CATHETERIZATION: SHX172C

## 2016-01-31 HISTORY — DX: Headache, unspecified: R51.9

## 2016-01-31 LAB — POCT ACTIVATED CLOTTING TIME
Activated Clotting Time: 252 seconds
Activated Clotting Time: 164 seconds

## 2016-01-31 SURGERY — ABDOMINAL AORTOGRAM

## 2016-01-31 MED ORDER — HEPARIN (PORCINE) IN NACL 2-0.9 UNIT/ML-% IJ SOLN
INTRAMUSCULAR | Status: AC
  Filled 2016-01-31: qty 1000

## 2016-01-31 MED ORDER — PANTOPRAZOLE SODIUM 20 MG PO TBEC
20.0000 mg | DELAYED_RELEASE_TABLET | Freq: Every day | ORAL | Status: DC
  Administered 2016-02-01: 11:00:00 20 mg via ORAL
  Filled 2016-01-31: qty 1

## 2016-01-31 MED ORDER — MIDAZOLAM HCL 2 MG/2ML IJ SOLN
INTRAMUSCULAR | Status: AC
  Filled 2016-01-31: qty 2

## 2016-01-31 MED ORDER — HYDRALAZINE HCL 20 MG/ML IJ SOLN: 10.0000 mg | INTRAMUSCULAR | Status: DC | PRN

## 2016-01-31 MED ORDER — VITAMIN D 1000 UNITS PO TABS
2000.0000 [IU] | ORAL_TABLET | Freq: Every day | ORAL | Status: DC
  Administered 2016-02-01: 11:00:00 2000 [IU] via ORAL
  Filled 2016-01-31: qty 2

## 2016-01-31 MED ORDER — SODIUM CHLORIDE 0.9% FLUSH: 3.0000 mL | INTRAVENOUS | Status: DC | PRN

## 2016-01-31 MED ORDER — ACETAMINOPHEN 325 MG PO TABS
650.0000 mg | ORAL_TABLET | ORAL | Status: DC | PRN
  Administered 2016-01-31 (×2): 650 mg via ORAL
  Filled 2016-01-31 (×2): qty 2

## 2016-01-31 MED ORDER — NITROGLYCERIN 1 MG/10 ML FOR IR/CATH LAB
INTRA_ARTERIAL | Status: DC | PRN
  Administered 2016-01-31: 200 ug

## 2016-01-31 MED ORDER — MORPHINE SULFATE (PF) 2 MG/ML IV SOLN
2.0000 mg | INTRAVENOUS | Status: DC | PRN
Start: 2016-01-31 — End: 2016-02-01

## 2016-01-31 MED ORDER — LIDOCAINE HCL (PF) 1 % IJ SOLN
INTRAMUSCULAR | Status: DC | PRN
  Administered 2016-01-31: 25 mL

## 2016-01-31 MED ORDER — DORZOLAMIDE HCL 2 % OP SOLN
2.0000 [drp] | Freq: Every day | OPHTHALMIC | Status: DC
  Administered 2016-02-01: 11:00:00 2 [drp] via OPHTHALMIC
  Filled 2016-01-31: qty 10

## 2016-01-31 MED ORDER — FLUTICASONE PROPIONATE 50 MCG/ACT NA SUSP
1.0000 | Freq: Every day | NASAL | Status: DC | PRN
  Filled 2016-01-31: qty 16

## 2016-01-31 MED ORDER — CLOPIDOGREL BISULFATE 75 MG PO TABS
75.0000 mg | ORAL_TABLET | Freq: Every day | ORAL | Status: DC
  Administered 2016-02-01: 11:00:00 75 mg via ORAL
  Filled 2016-01-31: qty 1

## 2016-01-31 MED ORDER — SODIUM CHLORIDE 0.9 % WEIGHT BASED INFUSION
3.0000 mL/kg/h | INTRAVENOUS | Status: DC
  Administered 2016-01-31: 3 mL/kg/h via INTRAVENOUS

## 2016-01-31 MED ORDER — LATANOPROST 0.005 % OP SOLN
2.0000 [drp] | Freq: Every day | OPHTHALMIC | Status: DC
  Administered 2016-02-01: 2 [drp] via OPHTHALMIC
  Filled 2016-01-31: qty 2.5

## 2016-01-31 MED ORDER — MIDAZOLAM HCL 2 MG/2ML IJ SOLN
INTRAMUSCULAR | Status: DC | PRN
  Administered 2016-01-31: 1 mg via INTRAVENOUS

## 2016-01-31 MED ORDER — SODIUM CHLORIDE 0.9 % WEIGHT BASED INFUSION: 1.0000 mL/kg/h | INTRAVENOUS | Status: DC

## 2016-01-31 MED ORDER — ASPIRIN EC 81 MG PO TBEC
81.0000 mg | DELAYED_RELEASE_TABLET | Freq: Every day | ORAL | Status: DC
  Administered 2016-02-01: 11:00:00 81 mg via ORAL
  Filled 2016-01-31 (×2): qty 1

## 2016-01-31 MED ORDER — SERTRALINE HCL 50 MG PO TABS
100.0000 mg | ORAL_TABLET | Freq: Two times a day (BID) | ORAL | Status: DC
  Administered 2016-01-31 – 2016-02-01 (×2): 100 mg via ORAL
  Filled 2016-01-31 (×2): qty 2

## 2016-01-31 MED ORDER — LIDOCAINE HCL (PF) 1 % IJ SOLN
INTRAMUSCULAR | Status: AC
  Filled 2016-01-31: qty 30

## 2016-01-31 MED ORDER — SODIUM CHLORIDE 0.9 % IV SOLN
INTRAVENOUS | Status: AC
  Administered 2016-01-31: 12:00:00 via INTRAVENOUS

## 2016-01-31 MED ORDER — HEPARIN SODIUM (PORCINE) 1000 UNIT/ML IJ SOLN
INTRAMUSCULAR | Status: AC
  Filled 2016-01-31: qty 1

## 2016-01-31 MED ORDER — LISINOPRIL 10 MG PO TABS
10.0000 mg | ORAL_TABLET | Freq: Every day | ORAL | Status: DC
  Administered 2016-02-01: 11:00:00 10 mg via ORAL
  Filled 2016-01-31: qty 1

## 2016-01-31 MED ORDER — CHLORTHALIDONE 25 MG PO TABS
25.0000 mg | ORAL_TABLET | Freq: Every day | ORAL | Status: DC
  Administered 2016-02-01: 25 mg via ORAL
  Filled 2016-01-31: qty 1

## 2016-01-31 MED ORDER — SODIUM CHLORIDE (HYPERTONIC) 5 % OP SOLN
1.0000 [drp] | Freq: Three times a day (TID) | OPHTHALMIC | Status: DC
  Administered 2016-01-31 – 2016-02-01 (×3): 1 [drp] via OPHTHALMIC
  Filled 2016-01-31: qty 15

## 2016-01-31 MED ORDER — HEPARIN (PORCINE) IN NACL 2-0.9 UNIT/ML-% IJ SOLN
INTRAMUSCULAR | Status: DC | PRN
  Administered 2016-01-31: 1000 mL

## 2016-01-31 MED ORDER — ADULT MULTIVITAMIN W/MINERALS CH
1.0000 | ORAL_TABLET | Freq: Every day | ORAL | Status: DC
  Administered 2016-01-31 – 2016-02-01 (×2): 1 via ORAL
  Filled 2016-01-31 (×2): qty 1

## 2016-01-31 MED ORDER — IODIXANOL 320 MG/ML IV SOLN
INTRAVENOUS | Status: DC | PRN
  Administered 2016-01-31: 150 mL via INTRAVENOUS

## 2016-01-31 MED ORDER — METOPROLOL TARTRATE 12.5 MG HALF TABLET
12.5000 mg | ORAL_TABLET | Freq: Two times a day (BID) | ORAL | Status: DC
Start: 2016-01-31 — End: 2016-02-01
  Administered 2016-01-31 – 2016-02-01 (×2): 12.5 mg via ORAL
  Filled 2016-01-31 (×2): qty 1

## 2016-01-31 MED ORDER — CLOPIDOGREL BISULFATE 75 MG PO TABS: 75.0000 mg | ORAL_TABLET | Freq: Every day | ORAL | Status: DC

## 2016-01-31 MED ORDER — TRAMADOL HCL 50 MG PO TABS
100.0000 mg | ORAL_TABLET | Freq: Two times a day (BID) | ORAL | Status: DC | PRN
  Administered 2016-01-31: 100 mg via ORAL
  Filled 2016-01-31 (×2): qty 2

## 2016-01-31 MED ORDER — ASPIRIN 81 MG PO CHEW
81.0000 mg | CHEWABLE_TABLET | ORAL | Status: AC
  Administered 2016-01-31: 81 mg via ORAL

## 2016-01-31 MED ORDER — ANGIOPLASTY BOOK
Freq: Once | Status: AC
  Administered 2016-01-31: 20:00:00
  Filled 2016-01-31: qty 1

## 2016-01-31 MED ORDER — FENTANYL CITRATE (PF) 100 MCG/2ML IJ SOLN
INTRAMUSCULAR | Status: AC
  Filled 2016-01-31: qty 2

## 2016-01-31 MED ORDER — FENTANYL CITRATE (PF) 100 MCG/2ML IJ SOLN
INTRAMUSCULAR | Status: DC | PRN
  Administered 2016-01-31: 25 ug via INTRAVENOUS

## 2016-01-31 MED ORDER — HEPARIN SODIUM (PORCINE) 1000 UNIT/ML IJ SOLN
INTRAMUSCULAR | Status: DC | PRN
  Administered 2016-01-31: 5000 [IU] via INTRAVENOUS

## 2016-01-31 MED ORDER — NITROGLYCERIN 1 MG/10 ML FOR IR/CATH LAB
INTRA_ARTERIAL | Status: AC
  Filled 2016-01-31: qty 10

## 2016-01-31 MED ORDER — ASPIRIN 81 MG PO CHEW
CHEWABLE_TABLET | ORAL | Status: AC
  Administered 2016-01-31: 81 mg via ORAL
  Filled 2016-01-31: qty 1

## 2016-01-31 MED ORDER — ONDANSETRON HCL 4 MG/2ML IJ SOLN: 4.0000 mg | Freq: Four times a day (QID) | INTRAMUSCULAR | Status: DC | PRN

## 2016-01-31 SURGICAL SUPPLY — 21 items
BALLN MUSTANG 5.0X20 75 (BALLOONS) ×4
BALLN MUSTANG 5.0X20 75CM (BALLOONS) ×1
BALLN MUSTANG 7.0X20 75 (BALLOONS) ×4
BALLN MUSTANG 7.0X20 75CM (BALLOONS) ×1
BALLOON MUSTANG 5.0X20 75 (BALLOONS) ×3 IMPLANT
BALLOON MUSTANG 7.0X20 75 (BALLOONS) ×3 IMPLANT
CATH ANGIO 5F PIGTAIL 65CM (CATHETERS) ×5 IMPLANT
CATH STRAIGHT 5FR 65CM (CATHETERS) ×5 IMPLANT
DEVICE CONTINUOUS FLUSH (MISCELLANEOUS) ×5 IMPLANT
KIT ENCORE 26 ADVANTAGE (KITS) ×5 IMPLANT
KIT PV (KITS) ×5 IMPLANT
SHEATH BRITE TIP 6FR 35CM (SHEATH) ×5 IMPLANT
SHEATH PINNACLE 5F 10CM (SHEATH) ×5 IMPLANT
SHEATH PINNACLE 6F 10CM (SHEATH) ×5 IMPLANT
STENT ABSOLUTE 9X40X135 (Permanent Stent) ×5 IMPLANT
SYR MEDRAD MARK V 150ML (SYRINGE) ×5 IMPLANT
TAPE RADIOPAQUE TURBO (MISCELLANEOUS) ×5 IMPLANT
TRANSDUCER W/STOPCOCK (MISCELLANEOUS) ×5 IMPLANT
TRAY PV CATH (CUSTOM PROCEDURE TRAY) ×5 IMPLANT
WIRE HITORQ VERSACORE ST 145CM (WIRE) ×5 IMPLANT
WIRE VERSACORE LOC 115CM (WIRE) ×5 IMPLANT

## 2016-01-31 NOTE — Care Management Note (Signed)
Case Management Note  Patient Details  Name: [name] [name] MRN: [id number] Date of Birth: Jan 31, 1941  Subjective/Objective:     Patient is s/p cath, NCM will cont to follow for dc needs.                Action/Plan:   Expected Discharge Date:                  Expected Discharge Plan:  Home/Self Care  In-House Referral:     Discharge planning Services  CM Consult  Post Acute Care Choice:    Choice offered to:     DME Arranged:    DME Agency:     HH Arranged:    HH Agency:     Status of Service:  In process, will continue to follow  If discussed at Long Length of Stay Meetings, dates discussed:    Additional Comments:  Zenon Mayo, RN 01/31/2016, 1:20 PM

## 2016-01-31 NOTE — Interval H&P Note (Signed)
History and Physical Interval Note:  01/31/2016 10:00 AM  [name] [name]  has presented today for surgery, with the diagnosis of pvd  The various methods of treatment have been discussed with the patient and family. After consideration of risks, benefits and other options for treatment, the patient has consented to  Procedure(s): Abdominal Aortogram w/Lower Extremity (N/A) as a surgical intervention .  The patient's history has been reviewed, patient examined, no change in status, stable for surgery.  I have reviewed the patient's chart and labs.  Questions were answered to the patient's satisfaction.     Quay Burow

## 2016-01-31 NOTE — H&P (View-Only) (Signed)
01/25/2016 [name] [name]   06/06/1941  [id number]  Primary Physician [name] Lynch, MD Primary Cardiologist: [name] Harp MD [name] [name]  HPI:  [name] [name] is a very pleasant 75 year old mild to moderately overweight Caucasian female mother of 2 children referred by [name] [name] for peripheral vascular evaluation. She has a history of treated hypertension and hyperlipidemia. Risk factors also include 25-pack-years of tobacco abuse having quit 08/23/10. She has never had a heart attack or stroke. She has had a right iliac stent placed in 2008 by [name] [name] and has been further evaluated by [name] [name] in [facility] in 2015. She has complained of progressive left lower extreme claudication. Recent Dopplers in the office performed 01/07/16 revealed fairly normal ABIs although she had high-frequency signals and left common and external iliac arteries.   Current Outpatient Prescriptions  Medication Sig Dispense Refill  . Aspirin-Salicylamide-Caffeine (BC HEADACHE POWDER PO) Take 1 packet by mouth every 6 (six) hours as needed (pain).    . chlorthalidone (HYGROTON) 25 MG tablet Take 1 tablet (25 mg total) by mouth daily. 30 tablet 11  . Cholecalciferol (VITAMIN D) 2000 UNITS tablet Take 2,000 Units by mouth daily.    . clopidogrel (PLAVIX) 75 MG tablet Take 1 tablet (75 mg total) by mouth every morning. 90 tablet 3  . dorzolamide (TRUSOPT) 2 % ophthalmic solution Place 2 drops into both eyes daily.    . fluticasone (FLONASE) 50 MCG/ACT nasal spray Place 1 spray into both nostrils daily as needed for allergies or rhinitis (congestion). 16 g 11  . latanoprost (XALATAN) 0.005 % ophthalmic solution Place 2 drops into both eyes daily.    [name] lisinopril (PRINIVIL,ZESTRIL) 10 MG tablet Take 1 tablet (10 mg total) by mouth daily. 90 tablet 3  . metoprolol tartrate (LOPRESSOR) 25 MG tablet Take 0.5 tablets (12.5 mg total) by mouth 2 (two) times daily. 30 tablet 11  . Multiple Vitamin (MULTIVITAMIN WITH  MINERALS) TABS tablet Take 1 tablet by mouth daily.    . pantoprazole (PROTONIX) 20 MG tablet Take 1 tablet (20 mg total) by mouth daily. 90 tablet 3  . Polyethyl Glycol-Propyl Glycol (SYSTANE OP) Place 1 drop into both eyes 2 (two) times daily.    . sertraline (ZOLOFT) 100 MG tablet Take 1 tablet (100 mg total) by mouth 2 (two) times daily. 60 tablet 11  . sodium chloride (MURO 128) 5 % ophthalmic ointment Place 1 application into both eyes 3 (three) times daily.    . traMADol (ULTRAM) 50 MG tablet Take 2 tablets (100 mg total) by mouth 2 (two) times daily as needed for moderate pain. 60 tablet 2   No current facility-administered medications for this visit.    Allergies  Allergen Reactions  . Cyclobenzaprine Itching    Possible reaction per pt  . Statins Swelling, Rash and Other (See Comments)    Swelling involving tongue; also causes muscle pain    Social History   Social History  . Marital Status: Widowed    Spouse Name: N/A  . Number of Children: N/A  . Years of Education: N/A   Occupational History  . Not on file.   Social History Main Topics  . Smoking status: Former Smoker -- 1.00 packs/day for 40 years    Types: Cigarettes    Quit date: 07/24/2010  . Smokeless tobacco: Never Used     Comment: STARTED BACK JULY 2013 AND RECENTLY QUIT 03-10-2012  . Alcohol Use: No  Comment: QUIT IN 68   . Drug Use: No  . Sexual Activity: No     Comment: 1st intercourse 75 yo-Fewer than 5 partners   Other Topics Concern  . Not on file   Social History Narrative     Review of Systems: General: negative for chills, fever, night sweats or weight changes.  Cardiovascular: negative for chest pain, dyspnea on exertion, edema, orthopnea, palpitations, paroxysmal nocturnal dyspnea or shortness of breath Dermatological: negative for rash Respiratory: negative for cough or wheezing Urologic: negative for hematuria Abdominal: negative for nausea, vomiting, diarrhea, bright red blood  per rectum, melena, or hematemesis Neurologic: negative for visual changes, syncope, or dizziness All other systems reviewed and are otherwise negative except as noted above.    Blood pressure 141/80, pulse 75, height 5\' 1"  (1.549 m), weight 148 lb 9.6 oz (67.405 kg).  General appearance: alert and no distress Neck: no adenopathy, no JVD, supple, symmetrical, trachea midline, thyroid not enlarged, symmetric, no tenderness/mass/nodules and Soft right carotid bruit Lungs: clear to auscultation bilaterally Heart: regular rate and rhythm, S1, S2 normal, no murmur, click, rub or gallop Extremities: extremities normal, atraumatic, no cyanosis or edema  EKG normal sinus rhythm at 75 without ST or T-wave changes. I personally reviewed this EKG  ASSESSMENT AND PLAN:   PVD (peripheral vascular disease) [name] [name] has a history of right lower extreme family stenting by [name] [name] September 2008. She has been seen by [name] [name] in [facility] 07/17/13. She complains of progressive left lower extremity claudication. Recent Dopplers performed 01/07/16 show high-grade left common and external iliac artery disease. Based on this, we decided to proceed with angiography and potential intervention.      [name] Harp MD FACP,FACC,FAHA, [facility] 01/25/2016 12:08 PM

## 2016-01-31 NOTE — Progress Notes (Signed)
Site area: left groin  Site Prior to Removal:  Level 1  Pressure Applied For 20 MINUTES    Minutes Beginning at 1315  Manual:   Yes.    Patient Status During Pull:  stable  Post Pull Groin Site:  Level 1  Post Pull Instructions Given:  Yes.    Post Pull Pulses Present:  Yes.    Dressing Applied:  Yes.    Comments:

## 2016-02-01 ENCOUNTER — Other Ambulatory Visit: Payer: Self-pay | Admitting: Physician Assistant

## 2016-02-01 ENCOUNTER — Encounter (HOSPITAL_COMMUNITY): Payer: Self-pay | Admitting: Physician Assistant

## 2016-02-01 DIAGNOSIS — I739 Peripheral vascular disease, unspecified: Secondary | ICD-10-CM

## 2016-02-01 DIAGNOSIS — I70212 Atherosclerosis of native arteries of extremities with intermittent claudication, left leg: Secondary | ICD-10-CM | POA: Diagnosis not present

## 2016-02-01 LAB — BASIC METABOLIC PANEL
Anion gap: 7 (ref 5–15)
BUN: 20 mg/dL (ref 6–20)
CO2: 24 mmol/L (ref 22–32)
Calcium: 8.6 mg/dL — ABNORMAL LOW (ref 8.9–10.3)
Chloride: 106 mmol/L (ref 101–111)
Creatinine, Ser: 1.3 mg/dL — ABNORMAL HIGH (ref 0.44–1.00)
GFR calc Af Amer: 45 mL/min — ABNORMAL LOW (ref >60)
GFR calc non Af Amer: 39 mL/min — ABNORMAL LOW (ref >60)
Glucose, Bld: 93 mg/dL (ref 65–99)
Potassium: 4.1 mmol/L (ref 3.5–5.1)
Sodium: 137 mmol/L (ref 135–145)

## 2016-02-01 LAB — CBC
HCT: 31.4 % — ABNORMAL LOW (ref 36.0–46.0)
Hemoglobin: 10.2 g/dL — ABNORMAL LOW (ref 12.0–15.0)
MCH: 32.1 pg (ref 26.0–34.0)
MCHC: 32.5 g/dL (ref 30.0–36.0)
MCV: 98.7 fL (ref 78.0–100.0)
Platelets: 133 10*3/uL — ABNORMAL LOW (ref 150–400)
RBC: 3.18 MIL/uL — ABNORMAL LOW (ref 3.87–5.11)
RDW: 14.1 % (ref 11.5–15.5)
WBC: 5.5 10*3/uL (ref 4.0–10.5)

## 2016-02-01 MED ORDER — ASPIRIN 81 MG PO TBEC: 81.0000 mg | DELAYED_RELEASE_TABLET | Freq: Every day | ORAL | Status: DC

## 2016-02-01 NOTE — Discharge Summary (Signed)
Discharge Summary    Patient ID: [name] [name],  MRN: [id number], DOB/AGE: 02-27-1941 75 y.o.  Admit date: 01/31/2016 Discharge date: 02/01/2016  Primary Care Provider: Georges Lynch Primary Cardiologist: Dr. Johnsie Cancel Dr. Gwenlyn Found (EP)   Discharge Diagnoses    Principal Problem:   Claudication [facility]) Active Problems:   Hypertension   Hyperlipemia   GERD (gastroesophageal reflux disease)   OSA (obstructive sleep apnea)   Peripheral vascular disease (HCC)   Allergies Allergies  Allergen Reactions  . Cyclobenzaprine Itching    Possible reaction per pt  . Statins Swelling, Rash and Other (See Comments)    Swelling involving tongue; also causes muscle pain     History of Present Illness    [name] [name] is a very pleasant 75 year old obese female with a history of HTN, HLD, previous tobacco abuse, PAD s/p right iliac stenting (2008) and no prior cardiac history referred to Dr. Gwenlyn Found by Dr. Johnsie Cancel for peripheral vascular evaluation.   She has a history of treated hypertension and hyperlipidemia. Risk factors also include 25-pack-years of tobacco abuse having quit 08/23/10. She has never had a heart attack or stroke. She has had a right iliac stent placed in 2008 by Dr. Benjamine Sprague and has been further evaluated by Dr. Maryjean Morn in [facility] in 2015. She has complained of progressive left lower extreme claudication. Recent Dopplers in the office performed 01/07/16 revealed fairly normal ABIs although she had high-frequency signals and left common and external iliac arteries.She saw Dr. Gwenlyn Found in the office and was set up for outpatient for angiography and potential endovascular therapy for lifestyle limiting claudication.   Hospital Course     Consultants: none  PAD with lifestyle limiting claudication: she underwent successful PTA and self-expanding nitinol stenting of a physiologically significant distal left common iliac artery stenosis for lifestyle limiting claudication. I have called the office  to set up Dopplers in our [facility] line office next week and an appt with Dr. Gwenlyn Found in 2-3 weeks. She will be discharged home on ASA and plavix.  The patient has had an uncomplicated hospital course and is recovering well. The femoral catheter site is stable.She has been seen by Dr. Tamala Julian today and deemed ready for discharge home. All follow-up appointments have been scheduled. Discharge medications are listed below.  _____________  Discharge Vitals Blood pressure (!) 118/50, pulse 78, temperature 97 F (36.1 C), temperature source Oral, resp. rate (!) 21, height 4\' 11"  (1.499 m), weight 138 lb 14.2 oz (63 kg), SpO2 98 %.  Filed Weights   01/31/16 0903 02/01/16 0346  Weight: 140 lb (63.5 kg) 138 lb 14.2 oz (63 kg)    Labs & Radiologic Studies     CBC  Recent Labs  02/01/16 0314  WBC 5.5  HGB 10.2*  HCT 31.4*  MCV 98.7  PLT [id number]*   Basic Metabolic Panel  Recent Labs  02/01/16 0314  NA 137  K 4.1  CL 106  CO2 24  GLUCOSE 93  BUN 20  CREATININE 1.30*  CALCIUM 8.6*     Diagnostic Studies/Procedures    PV Angiogram/Intervention7/24/17  History obtained fromchart review.Mrs. [name] [name] is a very pleasant 75 year old mild to moderately overweight Caucasian female mother of 2 children referred by Dr. Johnsie Cancel for peripheral vascular evaluation. She has a history of treated hypertension and hyperlipidemia. Risk factors also include 25-pack-years of tobacco abuse having quit 08/23/10. She has never had a heart attack or stroke. She has had a right iliac stent placed in 2008  by Dr. Benjamine Sprague and has been further evaluated [facility]. She has complained of progressive left lower extreme claudication. Recent Dopplers in the office performed 01/07/16 revealed fairly normal ABIs although she had high-frequency signals and left common and external iliac arteries.She presents today as an outpatient for angiography and potential endovascular therapy for lifestyle limiting  claudication.   Procedures Performed: 1. Abdominal aortogram/bilateral iliac angiogram/bifemoral runoff 2. PTA and stent left common iliac artery.  Final Impression:Successful PTA and self-expanding nitinol stenting of a physiologically significant distal left common iliac artery stenosis for lifestyle limiting claudication. The sheath will be pulled and pressure held once the ACT is demonstrated to be less than 170. Patient will be hydrated, will remain in the hospital overnight and will be discharged home in the morning. We will get Dopplers in our [facility] line office next week and I will see her back in 2-3 weeks thereafter. _____________  _____________    Disposition   Pt is being discharged home today in good condition.  Follow-up Plans & Appointments    Follow-up Information    Quay Burow, MD .   Specialties:  Cardiology, Radiology Why:  The office will call you with an appointment for lower extremity dopplers in 1 week and then an appointment with Dr. Gwenlyn Found in 2-3 weeks Contact information: [address] [place] [address] [phone]            Discharge Medications     Medication List    STOP taking these medications   BC HEADACHE POWDER PO     TAKE these medications   aspirin 81 MG EC tablet Take 1 tablet (81 mg total) by mouth daily.   chlorthalidone 25 MG tablet Commonly known as:  HYGROTON Take 1 tablet (25 mg total) by mouth daily.   clopidogrel 75 MG tablet Commonly known as:  PLAVIX Take 1 tablet (75 mg total) by mouth every morning.   dorzolamide 2 % ophthalmic solution Commonly known as:  TRUSOPT Place 2 drops into both eyes daily.   fluticasone 50 MCG/ACT nasal spray Commonly known as:  FLONASE Place 1 spray into both nostrils daily as needed for allergies or rhinitis (congestion).   latanoprost 0.005 % ophthalmic solution Commonly known as:  XALATAN Place 2 drops  into both eyes daily.   lisinopril 10 MG tablet Commonly known as:  PRINIVIL,ZESTRIL Take 1 tablet (10 mg total) by mouth daily.   metoprolol tartrate 25 MG tablet Commonly known as:  LOPRESSOR Take 0.5 tablets (12.5 mg total) by mouth 2 (two) times daily.   multivitamin with minerals Tabs tablet Take 1 tablet by mouth daily.   pantoprazole 20 MG tablet Commonly known as:  PROTONIX Take 1 tablet (20 mg total) by mouth daily.   sertraline 100 MG tablet Commonly known as:  ZOLOFT Take 1 tablet (100 mg total) by mouth 2 (two) times daily.   sodium chloride 5 % ophthalmic ointment Commonly known as:  MURO 0000000 Place 1 application into both eyes 3 (three) times daily.   SYSTANE OP Place 1 drop into both eyes 2 (two) times daily.   traMADol 50 MG tablet Commonly known as:  ULTRAM Take 2 tablets (100 mg total) by mouth 2 (two) times daily as needed for moderate pain.   Vitamin D 2000 units tablet Take 2,000 Units by mouth daily.          Outstanding Labs/Studies   LE dopplers at [facility] in 1 week.  Duration of Discharge Encounter   Greater than 30 minutes including physician time.  Signed, Angelena Form PA-C 02/01/2016, 11:27 AM The patient has been seen in conjunction with Nell Range, PAC. All aspects of care have been considered and discussed. The patient has been personally interviewed, examined, and all clinical data has been reviewed.   The patient underwent successful stent implantation for left lower extremity claudication reducing a 50% left iliac to 0%.  No local Site complications/hematoma.  Patient is discharged today and will follow-up at [facility] for vascular studies next week and Dr. Gwenlyn Found in 2-3 weeks.

## 2016-02-01 NOTE — Care Management Note (Signed)
Case Management Note  Patient Details  Name: [name] [name] MRN: [id number] Date of Birth: 24-Sep-1940  Subjective/Objective:     Patient is from home, on plavix,  NCM will cont to follow for dc needs.               Action/Plan:   Expected Discharge Date:                  Expected Discharge Plan:  Home/Self Care  In-House Referral:     Discharge planning Services  CM Consult  Post Acute Care Choice:    Choice offered to:     DME Arranged:    DME Agency:     HH Arranged:    HH Agency:     Status of Service:  Completed, signed off  If discussed at [employer] of Stay Meetings, dates discussed:    Additional Comments:  Zenon Mayo, RN 02/01/2016, 11:19 AM

## 2016-02-01 NOTE — Progress Notes (Signed)
       Patient Name: [name] [name] Date of Encounter: 02/01/2016    SUBJECTIVE: The patient feels well. States her leg feels better than previous. Had left lower extremity intervention yesterday. Developed small hematoma post procedure that was expressed.  TELEMETRY:  Normal sinus rhythm: Vitals:   01/31/16 2000 01/31/16 2111 02/01/16 0346 02/01/16 0755  BP: (!) 93/46 (!) 109/38 (!) 118/48 117/67  Pulse: 76 72 63 85  Resp: (!) 23  15 17   Temp: 98 F (36.7 C)  98.1 F (36.7 C) 97.4 F (36.3 C)  TempSrc: Oral  Oral Oral  SpO2: 99%  98% 100%  Weight:   138 lb 14.2 oz (63 kg)   Height:        Intake/Output Summary (Last 24 hours) at 02/01/16 0839 Last data filed at 02/01/16 0756  Gross per 24 hour  Intake           1172.5 ml  Output             1050 ml  Net            122.5 ml   LABS: Basic Metabolic Panel:  Recent Labs  02/01/16 0314  NA 137  K 4.1  CL 106  CO2 24  GLUCOSE 93  BUN 20  CREATININE 1.30*  CALCIUM 8.6*   CBC:  Recent Labs  02/01/16 0314  WBC 5.5  HGB 10.2*  HCT 31.4*  MCV 98.7  PLT 133*    Radiology/Studies:  No new data  Physical Exam: Blood pressure 117/67, pulse 85, temperature 97.4 F (36.3 C), temperature source Oral, resp. rate 17, height 4\' 11"  (1.499 m), weight 138 lb 14.2 oz (63 kg), SpO2 100 %. Weight change:   Wt Readings from Last 3 Encounters:  02/01/16 138 lb 14.2 oz (63 kg)  01/25/16 148 lb 9.6 oz (67.4 kg)  01/10/16 150 lb (68 kg)    Left groin is soft. No bruits heard. Moderate ecchymosis is present. Pedal pulse on the left is 2+.  ASSESSMENT:  1. Left lower extremity claudication treated treated with a night now self-expanding stent in a focal left internal iliac obstruction with 60% stenosis decreased to 0%. Patient is ambulated without difficulty.  Plan:  Plan discharge today with [facility] at [facility] line next week and follow-up with Dr. Gwenlyn Found in 2-3 weeks.  Signed, Belva Crome III 02/01/2016, 8:39 AM

## 2016-02-03 ENCOUNTER — Telehealth: Payer: Self-pay

## 2016-02-07 ENCOUNTER — Telehealth: Payer: Self-pay | Admitting: Cardiovascular Disease

## 2016-02-07 NOTE — Telephone Encounter (Signed)
Spoke with pt, she will try applying ice to the area for about 20 min 4 to 5 times a day. She will continue to take tylenol for the pain. She also has tramadol that she will try. If she cont to have problems or concerns she will call back.

## 2016-02-07 NOTE — Telephone Encounter (Signed)
Mrs. [name] [name] is calling because she had a Hematoma from the procedure and they got it down , but its still sore and hurting some and wants to know what can she do . Please call  Thanks

## 2016-02-08 ENCOUNTER — Emergency Department (HOSPITAL_BASED_OUTPATIENT_CLINIC_OR_DEPARTMENT_OTHER)
Admission: EM | Admit: 2016-02-08 | Discharge: 2016-02-08 | Disposition: A | Discharge status: Home / Self Care | Payer: PPO | Attending: Emergency Medicine | Admitting: Emergency Medicine

## 2016-02-08 ENCOUNTER — Emergency Department (HOSPITAL_BASED_OUTPATIENT_CLINIC_OR_DEPARTMENT_OTHER): Payer: PPO

## 2016-02-08 ENCOUNTER — Encounter (HOSPITAL_BASED_OUTPATIENT_CLINIC_OR_DEPARTMENT_OTHER): Payer: Self-pay | Admitting: Emergency Medicine

## 2016-02-08 DIAGNOSIS — R251 Tremor, unspecified: Secondary | ICD-10-CM

## 2016-02-08 DIAGNOSIS — R51 Headache: Secondary | ICD-10-CM | POA: Insufficient documentation

## 2016-02-08 DIAGNOSIS — Z79899 Other long term (current) drug therapy: Secondary | ICD-10-CM | POA: Diagnosis not present

## 2016-02-08 DIAGNOSIS — R519 Headache, unspecified: Secondary | ICD-10-CM

## 2016-02-08 DIAGNOSIS — I129 Hypertensive chronic kidney disease with stage 1 through stage 4 chronic kidney disease, or unspecified chronic kidney disease: Secondary | ICD-10-CM | POA: Diagnosis not present

## 2016-02-08 DIAGNOSIS — N183 Chronic kidney disease, stage 3 (moderate): Secondary | ICD-10-CM | POA: Diagnosis not present

## 2016-02-08 DIAGNOSIS — Z7982 Long term (current) use of aspirin: Secondary | ICD-10-CM | POA: Diagnosis not present

## 2016-02-08 DIAGNOSIS — Z87891 Personal history of nicotine dependence: Secondary | ICD-10-CM | POA: Diagnosis not present

## 2016-02-08 DIAGNOSIS — R110 Nausea: Secondary | ICD-10-CM | POA: Insufficient documentation

## 2016-02-08 DIAGNOSIS — R300 Dysuria: Secondary | ICD-10-CM | POA: Insufficient documentation

## 2016-02-08 DIAGNOSIS — R5383 Other fatigue: Secondary | ICD-10-CM | POA: Diagnosis not present

## 2016-02-08 DIAGNOSIS — G4489 Other headache syndrome: Secondary | ICD-10-CM | POA: Diagnosis not present

## 2016-02-08 LAB — TROPONIN I
Troponin I: <0.03 ng/mL (ref <0.03)
Troponin I: <0.03 ng/mL (ref <0.03)

## 2016-02-08 LAB — URINALYSIS, ROUTINE W REFLEX MICROSCOPIC
Bilirubin Urine: NEGATIVE
Glucose, UA: NEGATIVE mg/dL
Hgb urine dipstick: NEGATIVE
Ketones, ur: NEGATIVE mg/dL
Leukocytes, UA: NEGATIVE
Nitrite: NEGATIVE
Protein, ur: NEGATIVE mg/dL
Specific Gravity, Urine: 1.006 (ref 1.005–1.030)
pH: 5.5 (ref 5.0–8.0)

## 2016-02-08 LAB — CBC
HCT: 33.6 % — ABNORMAL LOW (ref 36.0–46.0)
Hemoglobin: 11.5 g/dL — ABNORMAL LOW (ref 12.0–15.0)
MCH: 33.4 pg (ref 26.0–34.0)
MCHC: 34.2 g/dL (ref 30.0–36.0)
MCV: 97.7 fL (ref 78.0–100.0)
Platelets: 190 10*3/uL (ref 150–400)
RBC: 3.44 MIL/uL — ABNORMAL LOW (ref 3.87–5.11)
RDW: 14 % (ref 11.5–15.5)
WBC: 6.5 10*3/uL (ref 4.0–10.5)

## 2016-02-08 LAB — BASIC METABOLIC PANEL
Anion gap: 9 (ref 5–15)
BUN: 16 mg/dL (ref 6–20)
CO2: 24 mmol/L (ref 22–32)
Calcium: 9.2 mg/dL (ref 8.9–10.3)
Chloride: 105 mmol/L (ref 101–111)
Creatinine, Ser: 1.1 mg/dL — ABNORMAL HIGH (ref 0.44–1.00)
GFR calc Af Amer: 55 mL/min — ABNORMAL LOW (ref >60)
GFR calc non Af Amer: 48 mL/min — ABNORMAL LOW (ref >60)
Glucose, Bld: 87 mg/dL (ref 65–99)
Potassium: 4.9 mmol/L (ref 3.5–5.1)
Sodium: 138 mmol/L (ref 135–145)

## 2016-02-08 LAB — PROTIME-INR
INR: 0.99
Prothrombin Time: 13.1 seconds (ref 11.4–15.2)

## 2016-02-08 MED ORDER — ACETAMINOPHEN 325 MG PO TABS
650.0000 mg | ORAL_TABLET | Freq: Once | ORAL | Status: AC
  Administered 2016-02-08: 650 mg via ORAL
  Filled 2016-02-08: qty 2

## 2016-02-08 NOTE — ED Provider Notes (Addendum)
[place] DEPT [facility] Provider Note   CSN: [id number] Arrival date & time: 02/08/16  1049  First Provider Contact:  First MD Initiated Contact with Patient 02/08/16 1119        History   Chief Complaint Chief Complaint  Patient presents with  . Headache    HPI [name] [name] is a 75 y.o. female.  The history is provided by the patient.  Headache   This is a new problem. The problem occurs constantly. The problem has been gradually improving. The headache is associated with an unknown factor. The pain is located in the bilateral and temporal region. The quality of the pain is described as throbbing. The pain is severe. The pain does not radiate. Associated symptoms include malaise/fatigue and nausea. Pertinent negatives include no fever, no chest pressure, no orthopnea, no palpitations, no shortness of breath and no vomiting. Associated symptoms comments: Blurry vision (now resolved). Possible seizure  . She has tried oral narcotic analgesics and acetaminophen for the symptoms. The treatment provided moderate relief.   Patient reported that daughter said that she was generalized shaking and was confused for several minutes following.   Patient does not remember this event, but remembers waking up and being surrounded by EMS.  Family reports EMS noted initial blood pressure 180s/ 100s.   Past Medical History:  Diagnosis Date  . Anxiety   . Arthritis    RA  . Carpal tunnel syndrome, bilateral   . Chronic kidney disease    CKD stage III, absence of left kidney  . Congenital absence of one kidney    Pt has right kidney only  . Depression   . GERD (gastroesophageal reflux disease)   . Headache   . Heart murmur   . Hypercholesteremia   . Hypertension   . Peripheral vascular disease ([place])    a. s/p R external iliac stent '06 with angioplasty '13 (Dr. Benjamine Sprague) b. 01/2016: s/p PTA/stenting in L common iliac artery (Dr. Gwenlyn Found)  . Polymyalgia rheumatica ([place])   . Psoriasis      Patient Active Problem List   Diagnosis Date Noted  . Peripheral vascular disease ([place])   . Claudication ([place]) 01/31/2016  . Other fatigue 10/18/2015  . Right knee pain 10/18/2015  . Acrochordon 08/03/2015  . Stasis dermatitis of both legs 01/18/2015  . Radiculopathy of lumbar region 01/06/2015  . Seborrheic keratosis 11/24/2014  . OSA (obstructive sleep apnea) 09/21/2014  . Osteoporosis 06/29/2014  . Thyroid nodule 06/29/2014  . Breast calcifications 06/29/2014  . Glaucoma 01/08/2014  . Trigeminal neuralgia 12/18/2013  . Basal cell carcinoma 11/12/2013  . Presbyopia 10/22/2013  . Resting tremor 10/14/2013  . Hearing loss 10/13/2013  . Polymyalgia rheumatica ([place]) 09/11/2013  . GERD (gastroesophageal reflux disease) 09/11/2013  . Pulmonary nodules 05/24/2012  . Carotid bruit 04/30/2012  . Hypertension 04/27/2012  . Anxiety and depression 04/27/2012  . Hyperlipemia 04/27/2012  . Cervical spondylosis with radiculopathy 04/19/2012    Past Surgical History:  Procedure Laterality Date  . ABDOMINAL HYSTERECTOMY  1963   Partial,  Due to bleeding after delivery  . ANTERIOR CERVICAL DECOMP/DISCECTOMY FUSION  04/19/2012   Procedure: ANTERIOR CERVICAL DECOMPRESSION/DISCECTOMY FUSION 2 LEVELS;  Surgeon: Winfield Cunas, MD;  Location: [place];  Service: Neurosurgery;  Laterality: N/A;  Cervical four-five,Cervical five-six anterior cervical decompression with fusion plating and bonegraft possible posterior cervical decompression  . APPENDECTOMY    . CARPAL TUNNEL RELEASE  1990  . CERVICAL FUSION  04/19/2012  . EYE SURGERY    .  ILIAC ARTERY STENT    . PERIPHERAL VASCULAR CATHETERIZATION N/A 01/31/2016   Procedure: Abdominal Aortogram;  Surgeon: [name], MD;  Location: [place] [facility];  Service: Cardiovascular;  Laterality: N/A;  . PERIPHERAL VASCULAR CATHETERIZATION Bilateral 01/31/2016   Procedure: Lower Extremity Angiography;  Surgeon: [name], MD;   Location: [place];  Service: Cardiovascular;  Laterality: Bilateral;  . PERIPHERAL VASCULAR CATHETERIZATION Left 01/31/2016   Procedure: Peripheral Vascular Intervention;  Surgeon: [name], MD;  Location: [place];  Service: Cardiovascular;  Laterality: Left;  ILIAC  . TONSILLECTOMY      OB History    Gravida Para Term Preterm AB Living   3 3       2    SAB TAB Ectopic Multiple Live Births                   Home Medications    Prior to Admission medications   Medication Sig Start Date End Date Taking? Authorizing Provider  aspirin EC 81 MG EC tablet Take 1 tablet (81 mg total) by mouth daily. 02/01/16   Eileen Stanford, PA-C  chlorthalidone (HYGROTON) 25 MG tablet Take 1 tablet (25 mg total) by mouth daily. 08/16/15   Elberta Leatherwood, MD  Cholecalciferol (VITAMIN D) 2000 UNITS tablet Take 2,000 Units by mouth daily.    Historical Provider, MD  clopidogrel (PLAVIX) 75 MG tablet Take 1 tablet (75 mg total) by mouth every morning. 03/10/15   Elberta Leatherwood, MD  dorzolamide (TRUSOPT) 2 % ophthalmic solution Place 2 drops into both eyes daily. 07/26/15   Historical Provider, MD  fluticasone (FLONASE) 50 MCG/ACT nasal spray Place 1 spray into both nostrils daily as needed for allergies or rhinitis (congestion). 08/10/14   Timmothy Euler, MD  latanoprost (XALATAN) 0.005 % ophthalmic solution Place 2 drops into both eyes daily. 07/26/15   Historical Provider, MD  lisinopril (PRINIVIL,ZESTRIL) 10 MG tablet Take 1 tablet (10 mg total) by mouth daily. 01/10/16   Elberta Leatherwood, MD  metoprolol tartrate (LOPRESSOR) 25 MG tablet Take 0.5 tablets (12.5 mg total) by mouth 2 (two) times daily. 01/10/16   Elberta Leatherwood, MD  Multiple Vitamin (MULTIVITAMIN WITH MINERALS) TABS tablet Take 1 tablet by mouth daily.    Historical Provider, MD  pantoprazole (PROTONIX) 20 MG tablet Take 1 tablet (20 mg total) by mouth daily. 08/16/15   Elberta Leatherwood, MD  Polyethyl Glycol-Propyl Glycol (SYSTANE OP)  Place 1 drop into both eyes 2 (two) times daily.    Historical Provider, MD  sertraline (ZOLOFT) 100 MG tablet Take 1 tablet (100 mg total) by mouth 2 (two) times daily. 05/21/15   Elberta Leatherwood, MD  sodium chloride (MURO 128) 5 % ophthalmic ointment Place 1 application into both eyes 3 (three) times daily.    Historical Provider, MD  traMADol (ULTRAM) 50 MG tablet Take 2 tablets (100 mg total) by mouth 2 (two) times daily as needed for moderate pain. 01/10/16   Elberta Leatherwood, MD    Family History Family History  Problem Relation Age of Onset  . Angina Mother   . Heart attack Father   . Melanoma Brother   . Cancer Sister     Lymphoma    Social History Social History  Substance Use Topics  . Smoking status: Former Smoker    Packs/day: 1.00    Years: 40.00    Types: Cigarettes    Quit date: 07/24/2010  .  Smokeless tobacco: Never Used     Comment: STARTED BACK JULY 2013 AND RECENTLY QUIT 03-10-2012  . Alcohol use No     Comment: QUIT IN 1990      Allergies   Cyclobenzaprine and Statins   Review of Systems Review of Systems  Constitutional: Positive for malaise/fatigue. Negative for appetite change, chills, fatigue and fever.  HENT: Negative for congestion, ear pain, facial swelling, mouth sores and sore throat.   Eyes: Negative for visual disturbance.  Respiratory: Negative for cough, chest tightness and shortness of breath.   Cardiovascular: Negative for chest pain, palpitations and orthopnea.  Gastrointestinal: Positive for nausea. Negative for abdominal pain, blood in stool, diarrhea and vomiting.  Endocrine: Negative for cold intolerance and heat intolerance.  Genitourinary: Positive for dysuria (this am). Negative for decreased urine volume, difficulty urinating and frequency.  Musculoskeletal: Negative for back pain and neck stiffness.  Skin: Negative for rash.  Neurological: Positive for headaches. Negative for dizziness, weakness and light-headedness.     Physical  Exam Updated Vital Signs BP 136/65 (BP Location: Right Arm)   Pulse 67   Temp 97.6 F (36.4 C) (Oral)   Resp 18   Ht 4\' 11"  (1.499 m)   Wt 140 lb (63.5 kg)   SpO2 99%   BMI 28.28 kg/m   Physical Exam  Constitutional: She is oriented to person, place, and time. She appears well-developed and well-nourished. No distress.  HENT:  Head: Normocephalic and atraumatic.  Right Ear: External ear normal.  Left Ear: External ear normal.  Nose: Nose normal.  Eyes: Conjunctivae and EOM are normal. Pupils are equal, round, and reactive to light. Right eye exhibits no discharge. Left eye exhibits no discharge. No scleral icterus.  Neck: Normal range of motion. Neck supple.  Cardiovascular: Normal rate, regular rhythm and normal heart sounds.  Exam reveals no gallop and no friction rub.   No murmur heard. Pulmonary/Chest: Effort normal and breath sounds normal. No stridor. No respiratory distress. She has no wheezes.  Abdominal: Soft. She exhibits no distension. There is no tenderness.    Musculoskeletal: She exhibits no edema or tenderness.  Neurological: She is alert and oriented to person, place, and time.  Mental Status: Alert and oriented to person, place, and time. Attention and concentration normal. Speech clear. Recent memory is intac  Cranial Nerves  II Visual Fields: Intact to confrontation. Visual fields intact. III, IV, VI: Pupils equal and reactive to light and near. Full eye movement without nystagmus  V Facial Sensation: Normal. No weakness of masticatory muscles  VII: No facial weakness or asymmetry  VIII Auditory Acuity: Grossly normal  IX/X: The uvula is midline; the palate elevates symmetrically  XI: Normal sternocleidomastoid and trapezius strength  XII: The tongue is midline. No atrophy or fasciculations.   Motor System: Muscle Strength: 5/5 and symmetric in the upper and lower extremities. No pronation or drift.  Muscle Tone: Tone and muscle bulk are normal in the  upper and lower extremities.   Reflexes: DTRs: 2+ and symmetrical in all four extremities. Plantar responses are flexor bilaterally.  Coordination: Intact finger-to-nose, heel-to-shin. No tremor.  Sensation: Intact to light touch, and pinprick. Negative Romberg test.  Gait: Routine at baseline per pt and family   Skin: Skin is warm and dry. No rash noted. She is not diaphoretic. No erythema.  Psychiatric: She has a normal mood and affect.     ED Treatments / Results  Labs (all labs ordered are listed, but only abnormal results  are displayed) Labs Reviewed  CBC - Abnormal; Notable for the following:       Result Value   RBC 3.44 (*)    Hemoglobin 11.5 (*)    HCT 33.6 (*)    All other components within normal limits  BASIC METABOLIC PANEL - Abnormal; Notable for the following:    Creatinine, Ser 1.10 (*)    GFR calc non Af Amer 48 (*)    GFR calc Af Amer 55 (*)    All other components within normal limits  URINALYSIS, ROUTINE W REFLEX MICROSCOPIC (NOT AT [facility])  TROPONIN I  PROTIME-INR  TROPONIN I    EKG  EKG Interpretation  Date/Time:  Tuesday February 08 2016 12:19:49 EDT Ventricular Rate:  64 PR Interval:    QRS Duration: 97 QT Interval:  412 QTC Calculation: 426 R Axis:   61 Text Interpretation:  Sinus rhythm Low voltage, precordial leads new TWI in III. no other TW changes in concurrent leads.  Confirmed by [facility] MD, [place] [phone]) on 02/08/2016 12:27:06 PM       Radiology Ct Head Wo Contrast  Result Date: 02/08/2016 CLINICAL DATA:  Headache this morning. Fainted. Elevated blood pressure. Recent vascular stent placement. EXAM: CT HEAD WITHOUT CONTRAST TECHNIQUE: Contiguous axial images were obtained from the base of the skull through the vertex without intravenous contrast. COMPARISON:  03/21/2014 FINDINGS: There is no evidence of acute cortical infarct, intracranial hemorrhage, mass, midline shift, or extra-axial fluid collection. Mild cerebral atrophy is within  normal limits for age. Periventricular white matter hypodensities are nonspecific but may reflect minimal chronic small vessel ischemic disease, not greater than expected for patient's age. Prior bilateral cataract extraction is noted. The visualized paranasal sinuses and mastoid air cells are clear. No acute osseous abnormality is seen. IMPRESSION: No evidence of acute intracranial abnormality. Unremarkable CT appearance of the brain for age. Electronically Signed   By: Logan Bores M.D.   On: 02/08/2016 12:40    Procedures Procedures (including critical care time)  Medications Ordered in ED Medications  acetaminophen (TYLENOL) tablet 650 mg (650 mg Oral Given 02/08/16 1411)     Initial Impression / Assessment and Plan / ED Course  I have reviewed the triage vital signs and the nursing notes.  Pertinent labs & imaging results that were available during my care of the patient were reviewed by me and considered in my medical decision making (see chart for details).  Clinical Course    Possible 1st time seizure vs complex migraine. Given age, likely from microvascular disease. Will r/o ICH, SAH, or mass given h/o headache with new seizure. AFVSS with improved BP. No evidence to suggest meningitis.   EKG with new T-wave inversion in lead 3. However patient not complaining of any chest pain or current nausea or shortness of breath. Initial troponin is negative. Will obtain additional troponin to rule out possible ACS however there is no suspicion given the lack of cardiac symptoms.  No electrolyte derangements. UA negative for infection.  CT head without evidence of ICH, SAH or mass. I spoke with neurology given the patient's age, who recommended outpatient workup for 1st time seizure. Patient is currently being followed by [facility].   Given the patient's co-morbidities, I discussed admission for observation; however she decided to be discharged home and states that she will follow-up  with her neurologist for further workup of this possible first-time seizure.  The patient was monitored for several hours without signs of decompensation. Delta troponin negative.  Headache completely resolved following Tylenol.  I personally reviewed and interpreted the diagnostic studies for this ED encounter, which were used in my clinical decision making for the management of this patient.  Patient is adequate for discharge with strict return precaution given to her and daughter.  Clinical Impression: 1. Acute nonintractable headache, unspecified headache type   2. Episode of shaking     Disposition: Discharge  Condition: Good  I have discussed the results, Dx and Tx plan with the pt(& family if present). He/she/they expressed understanding and agree(s) with the plan. Discharge instructions discussed at great length. Strict return precautions discussed and pt &/or family have verbalized understanding of the instructions. No further questions at time of discharge.    Current Discharge Medication List      Follow Up: [facility] [address] [place] [phone] Schedule an appointment as soon as possible for a visit in 3 days for further work up of possible seizure      Fatima Blank, MD 02/08/16 1905

## 2016-02-08 NOTE — ED Notes (Signed)
MD in room with pt now.

## 2016-02-08 NOTE — ED Notes (Signed)
Up to BR with assistance   Tolerated well

## 2016-02-08 NOTE — ED Triage Notes (Signed)
Pt having headache today, pt has elevated BP.  Pt has history of same.

## 2016-02-08 NOTE — ED Notes (Signed)
Alert moves all extremeties.  States she got up this am and went to kitchen.  Then began to feel strange.  Tingling around mouth  And left hand.next thing she knew EMS was there

## 2016-02-09 ENCOUNTER — Other Ambulatory Visit: Payer: Self-pay | Admitting: Cardiovascular Disease

## 2016-02-09 ENCOUNTER — Other Ambulatory Visit: Payer: Self-pay | Admitting: Physician Assistant

## 2016-02-09 DIAGNOSIS — I739 Peripheral vascular disease, unspecified: Secondary | ICD-10-CM

## 2016-02-17 ENCOUNTER — Ambulatory Visit (HOSPITAL_COMMUNITY)
Admission: RE | Admit: 2016-02-17 | Discharge: 2016-02-17 | Disposition: A | Discharge status: Home / Self Care | Payer: PPO | Source: Ambulatory Visit | Attending: Physician Assistant | Admitting: Physician Assistant

## 2016-02-17 ENCOUNTER — Encounter (INDEPENDENT_AMBULATORY_CARE_PROVIDER_SITE_OTHER): Payer: Self-pay

## 2016-02-17 DIAGNOSIS — I129 Hypertensive chronic kidney disease with stage 1 through stage 4 chronic kidney disease, or unspecified chronic kidney disease: Secondary | ICD-10-CM | POA: Diagnosis not present

## 2016-02-17 DIAGNOSIS — I739 Peripheral vascular disease, unspecified: Secondary | ICD-10-CM

## 2016-02-17 DIAGNOSIS — F329 Major depressive disorder, single episode, unspecified: Secondary | ICD-10-CM | POA: Insufficient documentation

## 2016-02-17 DIAGNOSIS — I708 Atherosclerosis of other arteries: Secondary | ICD-10-CM | POA: Insufficient documentation

## 2016-02-17 DIAGNOSIS — F419 Anxiety disorder, unspecified: Secondary | ICD-10-CM | POA: Insufficient documentation

## 2016-02-17 DIAGNOSIS — I700 Atherosclerosis of aorta: Secondary | ICD-10-CM | POA: Diagnosis not present

## 2016-02-17 DIAGNOSIS — E7800 Pure hypercholesterolemia, unspecified: Secondary | ICD-10-CM | POA: Diagnosis not present

## 2016-02-17 DIAGNOSIS — K219 Gastro-esophageal reflux disease without esophagitis: Secondary | ICD-10-CM | POA: Insufficient documentation

## 2016-02-17 DIAGNOSIS — N189 Chronic kidney disease, unspecified: Secondary | ICD-10-CM | POA: Insufficient documentation

## 2016-02-24 ENCOUNTER — Telehealth: Payer: Self-pay | Admitting: *Deleted

## 2016-02-24 DIAGNOSIS — I739 Peripheral vascular disease, unspecified: Secondary | ICD-10-CM

## 2016-02-24 DIAGNOSIS — I10 Essential (primary) hypertension: Secondary | ICD-10-CM

## 2016-02-24 NOTE — Telephone Encounter (Signed)
-----   Message from [name], MD sent at 02/23/2016 12:36 PM EDT ----- Improved left common iliac artery velocity status post stenting. Repeat 6 months ----- Message ----- From: Vanessa Ralphs, RN Sent: 02/22/2016   4:47 PM To: [name], MD  Please clarify results. Thanks

## 2016-03-08 ENCOUNTER — Encounter: Payer: Self-pay | Admitting: Cardiovascular Disease

## 2016-03-08 ENCOUNTER — Telehealth: Payer: Self-pay | Admitting: Cardiovascular Disease

## 2016-03-08 NOTE — Telephone Encounter (Signed)
Closed encounter °

## 2016-03-09 ENCOUNTER — Ambulatory Visit: Payer: PPO | Admitting: Family Medicine

## 2016-03-17 ENCOUNTER — Ambulatory Visit: Payer: PPO | Admitting: Family Medicine

## 2016-03-22 ENCOUNTER — Ambulatory Visit: Payer: PPO | Admitting: Cardiovascular Disease

## 2016-03-23 ENCOUNTER — Other Ambulatory Visit: Payer: Self-pay | Admitting: Family Medicine

## 2016-03-23 DIAGNOSIS — H40052 Ocular hypertension, left eye: Secondary | ICD-10-CM | POA: Diagnosis not present

## 2016-03-23 DIAGNOSIS — H401223 Low-tension glaucoma, left eye, severe stage: Secondary | ICD-10-CM | POA: Diagnosis not present

## 2016-03-23 DIAGNOSIS — H40051 Ocular hypertension, right eye: Secondary | ICD-10-CM | POA: Diagnosis not present

## 2016-03-23 DIAGNOSIS — H401213 Low-tension glaucoma, right eye, severe stage: Secondary | ICD-10-CM | POA: Diagnosis not present

## 2016-03-28 ENCOUNTER — Ambulatory Visit: Payer: PPO | Admitting: Cardiovascular Disease

## 2016-03-29 ENCOUNTER — Emergency Department (HOSPITAL_COMMUNITY)
Admission: EM | Admit: 2016-03-29 | Discharge: 2016-03-29 | Disposition: A | Discharge status: Home / Self Care | Payer: PPO | Attending: Emergency Medicine | Admitting: Emergency Medicine

## 2016-03-29 ENCOUNTER — Ambulatory Visit: Payer: PPO | Admitting: Family Medicine

## 2016-03-29 ENCOUNTER — Telehealth: Payer: Self-pay | Admitting: Family Medicine

## 2016-03-29 ENCOUNTER — Emergency Department (HOSPITAL_COMMUNITY): Payer: PPO

## 2016-03-29 ENCOUNTER — Encounter (HOSPITAL_COMMUNITY): Payer: Self-pay

## 2016-03-29 DIAGNOSIS — R5383 Other fatigue: Secondary | ICD-10-CM | POA: Diagnosis not present

## 2016-03-29 DIAGNOSIS — Z7982 Long term (current) use of aspirin: Secondary | ICD-10-CM | POA: Diagnosis not present

## 2016-03-29 DIAGNOSIS — N183 Chronic kidney disease, stage 3 (moderate): Secondary | ICD-10-CM | POA: Diagnosis not present

## 2016-03-29 DIAGNOSIS — I129 Hypertensive chronic kidney disease with stage 1 through stage 4 chronic kidney disease, or unspecified chronic kidney disease: Secondary | ICD-10-CM | POA: Insufficient documentation

## 2016-03-29 DIAGNOSIS — R21 Rash and other nonspecific skin eruption: Secondary | ICD-10-CM | POA: Insufficient documentation

## 2016-03-29 DIAGNOSIS — R079 Chest pain, unspecified: Secondary | ICD-10-CM | POA: Diagnosis not present

## 2016-03-29 DIAGNOSIS — Z87891 Personal history of nicotine dependence: Secondary | ICD-10-CM | POA: Insufficient documentation

## 2016-03-29 DIAGNOSIS — R0602 Shortness of breath: Secondary | ICD-10-CM | POA: Diagnosis not present

## 2016-03-29 DIAGNOSIS — R1013 Epigastric pain: Secondary | ICD-10-CM | POA: Insufficient documentation

## 2016-03-29 DIAGNOSIS — T7840XA Allergy, unspecified, initial encounter: Secondary | ICD-10-CM | POA: Diagnosis not present

## 2016-03-29 DIAGNOSIS — R531 Weakness: Secondary | ICD-10-CM | POA: Diagnosis not present

## 2016-03-29 DIAGNOSIS — Z85828 Personal history of other malignant neoplasm of skin: Secondary | ICD-10-CM | POA: Diagnosis not present

## 2016-03-29 LAB — COMPREHENSIVE METABOLIC PANEL
ALT: 11 U/L — ABNORMAL LOW (ref 14–54)
AST: 21 U/L (ref 15–41)
Albumin: 4 g/dL (ref 3.5–5.0)
Alkaline Phosphatase: 58 U/L (ref 38–126)
Anion gap: 9 (ref 5–15)
BUN: 23 mg/dL — ABNORMAL HIGH (ref 6–20)
CO2: 27 mmol/L (ref 22–32)
Calcium: 10.3 mg/dL (ref 8.9–10.3)
Chloride: 105 mmol/L (ref 101–111)
Creatinine, Ser: 1.47 mg/dL — ABNORMAL HIGH (ref 0.44–1.00)
GFR calc Af Amer: 39 mL/min — ABNORMAL LOW (ref >60)
GFR calc non Af Amer: 33 mL/min — ABNORMAL LOW (ref >60)
Glucose, Bld: 82 mg/dL (ref 65–99)
Potassium: 4.2 mmol/L (ref 3.5–5.1)
Sodium: 141 mmol/L (ref 135–145)
Total Bilirubin: 0.5 mg/dL (ref 0.3–1.2)
Total Protein: 6.5 g/dL (ref 6.5–8.1)

## 2016-03-29 LAB — URINALYSIS, ROUTINE W REFLEX MICROSCOPIC
Bilirubin Urine: NEGATIVE
Glucose, UA: NEGATIVE mg/dL
Hgb urine dipstick: NEGATIVE
Ketones, ur: NEGATIVE mg/dL
Leukocytes, UA: NEGATIVE
Nitrite: NEGATIVE
Protein, ur: NEGATIVE mg/dL
Specific Gravity, Urine: 1.011 (ref 1.005–1.030)
pH: 5.5 (ref 5.0–8.0)

## 2016-03-29 LAB — CBC WITH DIFFERENTIAL/PLATELET
Basophils Absolute: 0 10*3/uL (ref 0.0–0.1)
Basophils Relative: 1 %
Eosinophils Absolute: 0.1 10*3/uL (ref 0.0–0.7)
Eosinophils Relative: 2 %
HCT: 37.1 % (ref 36.0–46.0)
Hemoglobin: 12.4 g/dL (ref 12.0–15.0)
Lymphocytes Relative: 25 %
Lymphs Abs: 1.1 10*3/uL (ref 0.7–4.0)
MCH: 33.3 pg (ref 26.0–34.0)
MCHC: 33.4 g/dL (ref 30.0–36.0)
MCV: 99.7 fL (ref 78.0–100.0)
Monocytes Absolute: 0.5 10*3/uL (ref 0.1–1.0)
Monocytes Relative: 11 %
Neutro Abs: 2.8 10*3/uL (ref 1.7–7.7)
Neutrophils Relative %: 61 %
Platelets: 151 10*3/uL (ref 150–400)
RBC: 3.72 MIL/uL — ABNORMAL LOW (ref 3.87–5.11)
RDW: 13.4 % (ref 11.5–15.5)
WBC: 4.5 10*3/uL (ref 4.0–10.5)

## 2016-03-29 LAB — I-STAT TROPONIN, ED
Troponin i, poc: 0 ng/mL (ref 0.00–0.08)
Troponin i, poc: 0 ng/mL (ref 0.00–0.08)

## 2016-03-29 LAB — LIPASE, BLOOD: Lipase: 25 U/L (ref 11–51)

## 2016-03-29 LAB — BRAIN NATRIURETIC PEPTIDE: B Natriuretic Peptide: 12.1 pg/mL (ref 0.0–100.0)

## 2016-03-29 MED ORDER — ACETAMINOPHEN 325 MG PO TABS
650.0000 mg | ORAL_TABLET | Freq: Once | ORAL | Status: AC
  Administered 2016-03-29: 650 mg via ORAL

## 2016-03-29 MED ORDER — ACETAMINOPHEN 325 MG PO TABS
325.0000 mg | ORAL_TABLET | Freq: Once | ORAL | Status: DC
  Filled 2016-03-29: qty 1

## 2016-03-29 MED ORDER — GI COCKTAIL ~~LOC~~
30.0000 mL | Freq: Once | ORAL | Status: AC
  Administered 2016-03-29: 30 mL via ORAL
  Filled 2016-03-29: qty 30

## 2016-03-29 MED ORDER — SODIUM CHLORIDE 0.9 % IV BOLUS (SEPSIS)
1000.0000 mL | Freq: Once | INTRAVENOUS | Status: AC
  Administered 2016-03-29: 1000 mL via INTRAVENOUS

## 2016-03-29 NOTE — Telephone Encounter (Signed)
Needs refill on tramadol. Has an appt 03-30-16 at 9:30 with [place]icalupo and wants to get the prescription then

## 2016-03-29 NOTE — ED Notes (Signed)
Patient has returned from being out of the department; patient placed back on monitor, continuous pulse oximetry and blood pressure cuff; visitor at bedside 

## 2016-03-29 NOTE — ED Notes (Signed)
Pt in and out cathed for u/a-- peri care done with [facility], EMT prior to procedure.

## 2016-03-29 NOTE — ED Notes (Signed)
Attempted IV start x 2 in left arm-- unsuccessful-- will place IV team consult.

## 2016-03-29 NOTE — ED Triage Notes (Signed)
Per EMS - pt from home. Pt has had skin cancer. Pt has had intermittent rash x 1 year, seen dermatologist and unknown etiology of rash. C/o weakness this morning. Reports falling 2-3 times in last 2-3 days, denies hitting head w/ falls. A&O x 4. Hx stent 2 months ago. Takes plavix. Hx chronic UTIs

## 2016-03-29 NOTE — ED Notes (Signed)
Pt d/c home with family via w/c 

## 2016-03-29 NOTE — ED Notes (Signed)
Assisted Santiago Glad, RN with in and out cath and also collected an urine sample; patient readjusted on stretcher and resting at this time; visitor at bedside

## 2016-03-29 NOTE — ED Notes (Signed)
Pt ambulated independently without difficulty.

## 2016-03-29 NOTE — ED Provider Notes (Signed)
[name] [name] DEPT Provider Note   CSN: [id number] Arrival date & time: 03/29/16  0910     History   Chief Complaint Chief Complaint  Patient presents with  . Rash  . Weakness    HPI [name] [name] is a 75 y.o. female.  The history is provided by the patient.  Rash   This is a new problem. The current episode started yesterday. The problem is associated with nothing. There has been no fever. The rash is present on the right arm.   CC: fatigue  Onset/Duration: 5 hours Timing: constant Quality: Generalized fatigue Severity: Severe to the point patient has difficulty ambulating Modifying Factors:  Improved by: Nothing  Worsened by: Nothing Associated Signs/Symptoms:  Pertinent (+): Patient also endorses epigastric abdominal pain; nausea with 1 episode of emesis yesterday. One episode of loose bowel movement yesterday. She also endorses possible dysuria.  Pertinent (-): Fevers, chills, chest pain, shortness of breath, fall/trauma  Past Medical History:  Diagnosis Date  . Anxiety   . Arthritis    RA  . Carpal tunnel syndrome, bilateral   . Chronic kidney disease    CKD stage III, absence of left kidney  . Congenital absence of one kidney    Pt has right kidney only  . Depression   . GERD (gastroesophageal reflux disease)   . Headache   . Heart murmur   . Hypercholesteremia   . Hypertension   . Peripheral vascular disease ([place])    a. s/p R external iliac stent '06 with angioplasty '13 (Dr. Benjamine Sprague) b. 01/2016: s/p PTA/stenting in L common iliac artery (Dr. Gwenlyn Found)  . Polymyalgia rheumatica ([place])   . Psoriasis     Patient Active Problem List   Diagnosis Date Noted  . Peripheral vascular disease ([place])   . Claudication ([place]) 01/31/2016  . Other fatigue 10/18/2015  . Right knee pain 10/18/2015  . Acrochordon 08/03/2015  . Stasis dermatitis of both legs 01/18/2015  . Radiculopathy of lumbar region 01/06/2015  . Seborrheic keratosis 11/24/2014  . OSA (obstructive  sleep apnea) 09/21/2014  . Osteoporosis 06/29/2014  . Thyroid nodule 06/29/2014  . Breast calcifications 06/29/2014  . Glaucoma 01/08/2014  . Trigeminal neuralgia 12/18/2013  . Basal cell carcinoma 11/12/2013  . Presbyopia 10/22/2013  . Resting tremor 10/14/2013  . Hearing loss 10/13/2013  . Polymyalgia rheumatica ([place]) 09/11/2013  . GERD (gastroesophageal reflux disease) 09/11/2013  . Pulmonary nodules 05/24/2012  . Carotid bruit 04/30/2012  . Hypertension 04/27/2012  . Anxiety and depression 04/27/2012  . Hyperlipemia 04/27/2012  . Cervical spondylosis with radiculopathy 04/19/2012    Past Surgical History:  Procedure Laterality Date  . ABDOMINAL HYSTERECTOMY  1963   Partial,  Due to bleeding after delivery  . ANTERIOR CERVICAL DECOMP/DISCECTOMY FUSION  04/19/2012   Procedure: ANTERIOR CERVICAL DECOMPRESSION/DISCECTOMY FUSION 2 LEVELS;  Surgeon: Winfield Cunas, MD;  Location: [facility];  Service: Neurosurgery;  Laterality: N/A;  Cervical four-five,Cervical five-six anterior cervical decompression with fusion plating and bonegraft possible posterior cervical decompression  . APPENDECTOMY    . CARPAL TUNNEL RELEASE  1990  . CERVICAL FUSION  04/19/2012  . EYE SURGERY    . ILIAC ARTERY STENT    . PERIPHERAL VASCULAR CATHETERIZATION N/A 01/31/2016   Procedure: Abdominal Aortogram;  Surgeon: [name], MD;  Location: [facility];  Service: Cardiovascular;  Laterality: N/A;  . PERIPHERAL VASCULAR CATHETERIZATION Bilateral 01/31/2016   Procedure: Lower Extremity Angiography;  Surgeon: [name], MD;  Location: [facility]  [facility];  Service: Cardiovascular;  Laterality: Bilateral;  . PERIPHERAL VASCULAR CATHETERIZATION Left 01/31/2016   Procedure: Peripheral Vascular Intervention;  Surgeon: [name], MD;  Location: [facility];  Service: Cardiovascular;  Laterality: Left;  ILIAC  . TONSILLECTOMY      OB History    Gravida Para Term Preterm AB  Living   3 3       2    SAB TAB Ectopic Multiple Live Births                   Home Medications    Prior to Admission medications   Medication Sig Start Date End Date Taking? Authorizing Provider  aspirin EC 81 MG EC tablet Take 1 tablet (81 mg total) by mouth daily. 02/01/16  Yes Eileen Stanford, PA-C  chlorthalidone (HYGROTON) 25 MG tablet Take 1 tablet (25 mg total) by mouth daily. 08/16/15   Elberta Leatherwood, MD  Cholecalciferol (VITAMIN D) 2000 UNITS tablet Take 2,000 Units by mouth daily.    Historical Provider, MD  clopidogrel (PLAVIX) 75 MG tablet TAKE ONE TABLET BY MOUTH IN THE MORNING 03/23/16   Elberta Leatherwood, MD  dorzolamide (TRUSOPT) 2 % ophthalmic solution Place 2 drops into both eyes daily. 07/26/15   Historical Provider, MD  fluticasone (FLONASE) 50 MCG/ACT nasal spray Place 1 spray into both nostrils daily as needed for allergies or rhinitis (congestion). 08/10/14   Timmothy Euler, MD  latanoprost (XALATAN) 0.005 % ophthalmic solution Place 2 drops into both eyes daily. 07/26/15   Historical Provider, MD  lisinopril (PRINIVIL,ZESTRIL) 10 MG tablet Take 1 tablet (10 mg total) by mouth daily. 01/10/16   Elberta Leatherwood, MD  metoprolol tartrate (LOPRESSOR) 25 MG tablet Take 0.5 tablets (12.5 mg total) by mouth 2 (two) times daily. 01/10/16   Elberta Leatherwood, MD  Multiple Vitamin (MULTIVITAMIN WITH MINERALS) TABS tablet Take 1 tablet by mouth daily.    Historical Provider, MD  pantoprazole (PROTONIX) 20 MG tablet Take 1 tablet (20 mg total) by mouth daily. 08/16/15   Elberta Leatherwood, MD  Polyethyl Glycol-Propyl Glycol (SYSTANE OP) Place 1 drop into both eyes 2 (two) times daily.    Historical Provider, MD  sertraline (ZOLOFT) 100 MG tablet Take 1 tablet (100 mg total) by mouth 2 (two) times daily. 05/21/15   Elberta Leatherwood, MD  sodium chloride (MURO 128) 5 % ophthalmic ointment Place 1 application into both eyes 3 (three) times daily.    Historical Provider, MD  traMADol (ULTRAM) 50 MG tablet Take 2  tablets (100 mg total) by mouth 2 (two) times daily as needed for moderate pain. 01/10/16   Elberta Leatherwood, MD    Family History Family History  Problem Relation Age of Onset  . Angina Mother   . Heart attack Father   . Melanoma Brother   . Cancer Sister     Lymphoma    Social History Social History  Substance Use Topics  . Smoking status: Former Smoker    Packs/day: 1.00    Years: 40.00    Types: Cigarettes    Quit date: 07/24/2010  . Smokeless tobacco: Never Used     Comment: STARTED BACK JULY 2013 AND RECENTLY QUIT 03-10-2012  . Alcohol use No     Comment: QUIT IN 1990      Allergies   Cyclobenzaprine and Statins   Review of Systems Review of Systems  Constitutional: Positive for fatigue. Negative for chills and  fever.  HENT: Negative for congestion and sore throat.   Eyes: Negative for visual disturbance.  Respiratory: Negative for cough, chest tightness and shortness of breath.   Cardiovascular: Negative for chest pain and palpitations.  Gastrointestinal: Positive for abdominal pain, diarrhea, nausea and vomiting. Negative for blood in stool.  Genitourinary: Positive for dysuria. Negative for decreased urine volume and difficulty urinating.  Musculoskeletal: Negative for back pain and neck stiffness.  Skin: Positive for rash.  Neurological: Negative for light-headedness and headaches.  Psychiatric/Behavioral: Negative for confusion.  All other systems reviewed and are negative.    Physical Exam Updated Vital Signs BP (!) 110/53 (BP Location: Right Arm)   Pulse 76   Temp 98.2 F (36.8 C) (Oral)   Resp 15   Ht 4\' 11"  (1.499 m)   Wt 140 lb (63.5 kg)   SpO2 97%   BMI 28.28 kg/m   Physical Exam  Constitutional: She is oriented to person, place, and time. She appears well-developed and well-nourished. No distress.  HENT:  Head: Normocephalic and atraumatic.  Nose: Nose normal.  Eyes: Conjunctivae and EOM are normal. Pupils are equal, round, and reactive to  light. Right eye exhibits no discharge. Left eye exhibits no discharge. No scleral icterus.  Neck: Normal range of motion. Neck supple.  Cardiovascular: Normal rate and regular rhythm.  Exam reveals no gallop and no friction rub.   No murmur heard. Pulmonary/Chest: Effort normal and breath sounds normal. No stridor. No respiratory distress. She has no rales.  Abdominal: Soft. She exhibits no distension. There is no tenderness.  Musculoskeletal: She exhibits no edema or tenderness.  Neurological: She is alert and oriented to person, place, and time.  Skin: Skin is warm and dry. Rash noted. Rash is papular (small dispersed papules on the right forearm. Blanching, nontender, no Induration.). She is not diaphoretic. No erythema.  Psychiatric: She has a normal mood and affect.  Vitals reviewed.    ED Treatments / Results  Labs (all labs ordered are listed, but only abnormal results are displayed) Labs Reviewed  COMPREHENSIVE METABOLIC PANEL - Abnormal; Notable for the following:       Result Value   BUN 23 (*)    Creatinine, Ser 1.47 (*)    ALT 11 (*)    GFR calc non Af Amer 33 (*)    GFR calc Af Amer 39 (*)    All other components within normal limits  CBC WITH DIFFERENTIAL/PLATELET - Abnormal; Notable for the following:    RBC 3.72 (*)    All other components within normal limits  LIPASE, BLOOD  URINALYSIS, ROUTINE W REFLEX MICROSCOPIC (NOT AT [facility])  BRAIN NATRIURETIC PEPTIDE  I-STAT TROPOININ, ED  Randolm Idol, ED    EKG  EKG Interpretation  Date/Time:  Wednesday March 29 2016 09:20:03 EDT Ventricular Rate:  76 PR Interval:    QRS Duration: 96 QT Interval:  364 QTC Calculation: 410 R Axis:   72 Text Interpretation:  Sinus rhythm Confirmed by [facility] MD, Naveyah Iacovelli ([id number]) on 03/29/2016 10:32:44 AM       Radiology Dg Chest 2 View  Result Date: 03/29/2016 CLINICAL DATA:  Weakness and recent falls EXAM: CHEST  2 VIEW COMPARISON:  06/08/2015 FINDINGS: Chronic  interstitial coarsening, stable. There is no edema, consolidation, effusion, or pneumothorax. Normal heart size and mediastinal contours. Mild S shaped scoliosis. IMPRESSION: Stable.  No evidence of active disease. Electronically Signed   By: Monte Fantasia M.D.   On: 03/29/2016 09:47    Procedures  Procedures (including critical care time)  Medications Ordered in ED Medications  gi cocktail (Maalox,Lidocaine,Donnatal) (30 mLs Oral Given 03/29/16 1043)  sodium chloride 0.9 % bolus 1,000 mL (0 mLs Intravenous Stopped 03/29/16 1433)  acetaminophen (TYLENOL) tablet 650 mg (650 mg Oral Given 03/29/16 1218)     Initial Impression / Assessment and Plan / ED Course  I have reviewed the triage vital signs and the nursing notes.  Pertinent labs & imaging results that were available during my care of the patient were reviewed by me and considered in my medical decision making (see chart for details).  Clinical Course    1. Rash Etiology unknown at this time. No evidence of superimposed infection or underlying abscess. She is instructed to monitor and follow up with primary care provider as needed.  2. Fatigue. Workup without anemia. Renal function mildly decreased which is likely secondary to decreased by mouth intake per the patient. Patient provided with IV fluids. Significant improvement in patient's fatigue. Patient able to ambulate without competition.  3. Epigastric pain. Significant improvement following the GI cocktail. EKG as above without acute ischemic changes or pericarditis. Chest x-ray without evidence suggestive of pneumonia, pneumothorax, pneumomediastinum.  No abnormal contour of the mediastinum to suggest dissection. No evidence of acute injuries. Troponins negative 2. Low suspicion for ACS. Low probability for PE. Doubt aortic dissection or esophageal perforation. Likely secondary to patient's underlying GERD.  Patient is safe for discharge with strict return  precautions.  Final Clinical Impressions(s) / ED Diagnoses   Final diagnoses:  Chest pain  Other fatigue  Epigastric pain   Disposition: Discharge  Condition: Good  I have discussed the results, Dx and Tx plan with the patient and daughter who expressed understanding and agree(s) with the plan. Discharge instructions discussed at great length. The patient and daughter was given strict return precautions who verbalized understanding of the instructions. No further questions at time of discharge.    Discharge Medication List as of 03/29/2016  3:40 PM      Follow Up: Primary Care Provider  Call  As needed      Fatima Blank, MD 03/29/16 [phone]

## 2016-03-30 ENCOUNTER — Ambulatory Visit (INDEPENDENT_AMBULATORY_CARE_PROVIDER_SITE_OTHER): Payer: PPO | Admitting: Family Medicine

## 2016-03-30 ENCOUNTER — Encounter: Payer: Self-pay | Admitting: Family Medicine

## 2016-03-30 VITALS — BP 133/59 | HR 86 | Temp 98.1°F | Wt 149.0 lb

## 2016-03-30 DIAGNOSIS — R21 Rash and other nonspecific skin eruption: Secondary | ICD-10-CM | POA: Diagnosis not present

## 2016-03-30 DIAGNOSIS — M353 Polymyalgia rheumatica: Secondary | ICD-10-CM | POA: Diagnosis not present

## 2016-03-30 DIAGNOSIS — Z23 Encounter for immunization: Secondary | ICD-10-CM

## 2016-03-30 MED ORDER — PREDNISONE 20 MG PO TABS: 40.0000 mg | ORAL_TABLET | Freq: Every day | ORAL | 0 refills | Status: DC

## 2016-03-30 MED ORDER — TRAMADOL HCL 50 MG PO TABS: 100.0000 mg | ORAL_TABLET | Freq: Two times a day (BID) | ORAL | 0 refills | Status: DC | PRN

## 2016-03-30 NOTE — Progress Notes (Signed)
   Subjective:   [name] [name] is a 75 y.o. female with a history of HTN, HLD, PVD, OSA here for body aches  Body aches - reports long history of myalgias - cramping is worse over last 1-2 wks - no cold or flu symptoms, no fevers, +chronic intermittent headaches, + chronic joint pain - was previously seen by Dr Aundria Rud, Rheum for possible PMR - diagnosis was unclear and never confirmed - there are some notes that she did not respond well to steroids  Itching - patient reports itching over arms and legs - she points to several rough areas over her arms  (appear to be SKs) - present intermittently for years - states it feels like there are grains of rice under her skin - no changes in detergents, soaps, or other exposures  Review of Systems:  Per HPI.   Social History: Former smoker  Objective:  BP (!) 133/59   Pulse 86   Temp 98.1 F (36.7 C) (Oral)   Wt 149 lb (67.6 kg)   BMI 30.09 kg/m   Gen:  75 y.o. female in NAD, sitting comfortably HEENT: NCAT, MMM, EOMI, PERRL, anicteric sclerae CV: RRR, no MRG Resp: Non-labored, CTAB, no wheezes noted Ext: WWP MSK: TTP over large muscle groups, gait intact though slow Skin: multiple SKs, no erythematous areas, no fluctuance or induration, multiple skin tags in R axilla Neuro: Alert and oriented, speech normal     Assessment & Plan:     [name] [name] is a 75 y.o. female here for   Rash and nonspecific skin eruption No rash noted, only SKs Advised continuing to be followed by Derm Make appt for removal of skin tags if desired  Polymyalgia rheumatica Possible h/o PMR This could indicate flare of disease Not on immunomodulator Prednisone burst 40mg  daily x5d F/u in 1 wk Make f/u appt with rheum as well Recent BMET with normal electrolytes      [name] Crews, MD MPH PGY-3,  [facility] 03/30/2016  11:19 AM

## 2016-03-30 NOTE — Assessment & Plan Note (Signed)
Possible h/o PMR This could indicate flare of disease Not on immunomodulator Prednisone burst 40mg  daily x5d F/u in 1 wk Make f/u appt with rheum as well Recent BMET with normal electrolytes

## 2016-03-30 NOTE — Patient Instructions (Signed)
Nice to meet you today. Take prednisone every morning for the next 5 days. This may help if you are having a flareup of your polymyalgia rheumatica. Please call Dr. Elmon Else office, the rheumatologist, to make a follow-up appointment with him as well. His phone number is [phone].  Come back to see primary care doctor in one week to follow-up on this.  Take care, Dr. Jacinto Reap

## 2016-03-30 NOTE — Assessment & Plan Note (Signed)
No rash noted, only SKs Advised continuing to be followed by Derm Make appt for removal of skin tags if desired

## 2016-04-04 ENCOUNTER — Ambulatory Visit (INDEPENDENT_AMBULATORY_CARE_PROVIDER_SITE_OTHER): Payer: PPO | Admitting: Cardiovascular Disease

## 2016-04-04 ENCOUNTER — Encounter: Payer: Self-pay | Admitting: Cardiovascular Disease

## 2016-04-04 VITALS — BP 110/60 | HR 100 | Ht 59.0 in | Wt 145.0 lb

## 2016-04-04 DIAGNOSIS — I739 Peripheral vascular disease, unspecified: Secondary | ICD-10-CM

## 2016-04-04 NOTE — Assessment & Plan Note (Addendum)
[name] [name]  returns today for follow-up of PAD. I performed PTA and stenting of her left external iliac artery 01/31/16. She did have a 60% mid left SFA stenosis. And she feels clinically improved. Her Dopplers performed subsequent to her procedure did show improvement in her iliac velocities. Since her intervention, her left lower extremity claudication has resolved.

## 2016-04-04 NOTE — Patient Instructions (Signed)
Medication Instructions:  NO CHANGES.   Testing/Procedures: Your physician has requested that you have an ankle brachial index (ABI). During this test an ultrasound and blood pressure cuff are used to evaluate the arteries that supply the arms and legs with blood. Allow thirty minutes for this exam. There are no restrictions or special instructions.  IN 6 MONTHS FROM LAST DOPPLERS.  Your physician has requested that you have aN AORTA/ILIAC doppler- During this test, ultrasound is used to evaluate arterial blood flow in your aorta and iliac arteries.  IN 6 MONTHS FROM LAST DOPPLERS.   Follow-Up: You have been referred to [facility] for the LIPID(CHOLESTEROL) CLINIC FOR EVALUATION.  Your physician recommends that you schedule a follow-up appointment ON AN AS NEEDED BASIS WITH DR BERRY.   If you need a refill on your cardiac medications before your next appointment, please call your pharmacy.

## 2016-04-04 NOTE — Progress Notes (Signed)
04/04/2016 [name] [name]   1940/12/29  [id number]  Primary Physician Georges Lynch, MD Primary Cardiologist: [name] MD Renae Gloss  HPI:  [name] [name] is a very pleasant 75 year old mild to moderately overweight Caucasian female mother of 2 children referred by Dr. Johnsie Cancel for peripheral vascular evaluation. I last saw her in the office 01/25/16. She has a history of treated hypertension and hyperlipidemia. Risk factors also include 25-pack-years of tobacco abuse having quit 08/23/10. She has never had a heart attack or stroke. She has had a right iliac stent placed in 2008 by Dr. Benjamine Sprague and has been further evaluated by Dr. Maryjean Morn in [facility] in 2015. She has complained of progressive left lower extreme claudication. Recent Dopplers in the office performed 01/07/16 revealed fairly normal ABIs although she had high-frequency signals and left common and external iliac arteries. I performed angiography on her 01/31/16 revealing a physiologically significant distal left common iliac artery stenosis which I stented. She had a 60-70% focal mid left SFA stenosis with three-vessel runoff. Her postprocedure.Doppler showed improvement in her velocities in that area. Her claudication on the left has since resolved.   Current Outpatient Prescriptions  Medication Sig Dispense Refill  . aspirin EC 81 MG EC tablet Take 1 tablet (81 mg total) by mouth daily.    . chlorthalidone (HYGROTON) 25 MG tablet Take 1 tablet (25 mg total) by mouth daily. 30 tablet 11  . Cholecalciferol (VITAMIN D) 2000 UNITS tablet Take 2,000 Units by mouth daily.    . clopidogrel (PLAVIX) 75 MG tablet TAKE ONE TABLET BY MOUTH IN THE MORNING 90 tablet 3  . dorzolamide (TRUSOPT) 2 % ophthalmic solution Place 2 drops into both eyes daily.    . fluticasone (FLONASE) 50 MCG/ACT nasal spray Place 1 spray into both nostrils daily as needed for allergies or rhinitis (congestion). 16 g 11  . latanoprost (XALATAN) 0.005 % ophthalmic  solution Place 2 drops into both eyes daily.    [name] lisinopril (PRINIVIL,ZESTRIL) 10 MG tablet Take 1 tablet (10 mg total) by mouth daily. 90 tablet 3  . Multiple Vitamin (MULTIVITAMIN WITH MINERALS) TABS tablet Take 1 tablet by mouth daily.    . pantoprazole (PROTONIX) 20 MG tablet Take 1 tablet (20 mg total) by mouth daily. 90 tablet 3  . Polyethyl Glycol-Propyl Glycol (SYSTANE OP) Place 1 drop into both eyes 2 (two) times daily.    . predniSONE (DELTASONE) 20 MG tablet Take 2 tablets (40 mg total) by mouth daily with breakfast. 10 tablet 0  . sertraline (ZOLOFT) 100 MG tablet Take 1 tablet (100 mg total) by mouth 2 (two) times daily. 60 tablet 11  . sodium chloride (MURO 128) 5 % ophthalmic ointment Place 1 application into both eyes 3 (three) times daily.    . traMADol (ULTRAM) 50 MG tablet Take 2 tablets (100 mg total) by mouth 2 (two) times daily as needed for moderate pain. 60 tablet 0  . metoprolol tartrate (LOPRESSOR) 25 MG tablet Take 0.5 tablets (12.5 mg total) by mouth 2 (two) times daily. (Patient not taking: Reported on 04/04/2016) 30 tablet 11   No current facility-administered medications for this visit.     Allergies  Allergen Reactions  . Cyclobenzaprine Itching    Possible reaction per pt  . Statins Swelling, Rash and Other (See Comments)    Swelling involving tongue; also causes muscle pain    Social History   Social History  . Marital status: Widowed  Spouse name: N/A  . Number of children: N/A  . Years of education: N/A   Occupational History  . Not on file.   Social History Main Topics  . Smoking status: Former Smoker    Packs/day: 1.00    Years: 40.00    Types: Cigarettes    Quit date: 07/24/2010  . Smokeless tobacco: Never Used     Comment: STARTED BACK JULY 2013 AND RECENTLY QUIT 03-10-2012  . Alcohol use No     Comment: QUIT IN 1990   . Drug use: No  . Sexual activity: No     Comment: 1st intercourse 75 yo-Fewer than 5 partners   Other Topics  Concern  . Not on file   Social History Narrative  . No narrative on file     Review of Systems: General: negative for chills, fever, night sweats or weight changes.  Cardiovascular: negative for chest pain, dyspnea on exertion, edema, orthopnea, palpitations, paroxysmal nocturnal dyspnea or shortness of breath Dermatological: negative for rash Respiratory: negative for cough or wheezing Urologic: negative for hematuria Abdominal: negative for nausea, vomiting, diarrhea, bright red blood per rectum, melena, or hematemesis Neurologic: negative for visual changes, syncope, or dizziness All other systems reviewed and are otherwise negative except as noted above.    Blood pressure 110/60, pulse 100, height 4\' 11"  (1.499 m), weight 145 lb (65.8 kg).  General appearance: alert and no distress Neck: no adenopathy, no carotid bruit, no JVD, supple, symmetrical, trachea midline and thyroid not enlarged, symmetric, no tenderness/mass/nodules Lungs: clear to auscultation bilaterally Heart: regular rate and rhythm, S1, S2 normal, no murmur, click, rub or gallop Extremities: extremities normal, atraumatic, no cyanosis or edema  EKG not performed today  ASSESSMENT AND PLAN:   Peripheral vascular disease ([place]) [name] [name]  returns today for follow-up of PAD. I performed PTA and stenting of her left external iliac artery 01/31/16. She did have a 60% mid left SFA stenosis. And she feels clinically improved. Her Dopplers performed subsequent to her procedure did show improvement in her iliac velocities. Since her intervention, her left lower extremity claudication has resolved.      [name] MD FACP,FACC,FAHA, [facility] 04/04/2016 9:40 AM

## 2016-04-07 ENCOUNTER — Encounter: Payer: Self-pay | Admitting: Internal Medicine

## 2016-04-07 ENCOUNTER — Ambulatory Visit (INDEPENDENT_AMBULATORY_CARE_PROVIDER_SITE_OTHER): Payer: PPO | Admitting: Internal Medicine

## 2016-04-07 DIAGNOSIS — R21 Rash and other nonspecific skin eruption: Secondary | ICD-10-CM

## 2016-04-07 NOTE — Progress Notes (Signed)
   Subjective:    Patient ID: [name] [name], female    DOB: August 06, 1940, 75 y.o.   MRN: [id number]  HPI  Patient presents for follow up of rash.   Rash Patient seen on 9/21 for reported rash. She is followed by dermatology already, and was advised to continue following with them, as no rash was noted on exam that day. She was started on a prednisone burst for flare of polymyalgia rheumatica with worsening body aches. Today she reports that she has completed her prednisone, and her rash has almost completely resolved. She endorses a few lesions on her L arm, R leg, and forehead, but says all other areas have resolved. Generally feels much better.   Of note, patient is questioning why she is on three blood pressure medications. She would also like to begin a new cholesterol medication that her vascular specialist told her about. She reports that this medication is a twice a year injection, and she has scheduled an appointment with a pharmacist to discuss this. She would also like to schedule an appointment with her PCP Dr. Alease Frame to discuss these potential medication changes.    Review of Systems See HPI.     Objective:   Physical Exam  Constitutional: She is oriented to person, place, and time. She appears well-developed and well-nourished.  Elderly female sitting in chair in NAD  HENT:  Head: Normocephalic and atraumatic.  Pulmonary/Chest: Effort normal. No respiratory distress.  Neurological: She is alert and oriented to person, place, and time.  Skin:  Seborrheic keratoses present on L forearm. Two small erythematous lesions with minimal amounts of crusted discharge on L medial eyebrow. No other rashes or lesions noted.   Psychiatric: She has a normal mood and affect. Her behavior is normal.      Assessment & Plan:  Rash and nonspecific skin eruption Almost completely resolved after prednisone burst. No need for continued follow-up.   Adin Hector, MD, MPH PGY-2 [facility] Pager [phone]

## 2016-04-07 NOTE — Assessment & Plan Note (Signed)
Almost completely resolved after prednisone burst. No need for continued follow-up.

## 2016-04-07 NOTE — Patient Instructions (Signed)
It was nice meeting you today Ms. Dino!  Please continue to take your medications as you have been, and schedule an appointment with Dr. Alease Frame to discuss your blood pressure and cholesterol.   If you have any questions or concerns, please feel free to call the clinic.   Be well,  Dr. Avon Gully

## 2016-04-12 ENCOUNTER — Telehealth: Payer: Self-pay | Admitting: Family Medicine

## 2016-04-12 NOTE — Telephone Encounter (Signed)
Pt would like to speak with PCP about upcoming appointment at the lipid clinic. Pt wants to make sure going to the clinic is the right thing to do. Please advise. Thanks! ep

## 2016-04-13 NOTE — Telephone Encounter (Signed)
Called patient. She wanted my opinion on her cardiologist's recommendation to go to a "Lipid Clinic" which would treat her hyperlipidemia via non-statin medication. I informed her that this was ultimately her choice but due to her persistent hyperlipidemia and allergy to statins it would be my recommendation to move forward with this plan. Patient was very grateful for this and had no further questions.   Elberta Leatherwood, MD,MS,  PGY3 04/13/2016 5:01 PM

## 2016-04-26 NOTE — Progress Notes (Signed)
Patient ID: [name] [name], female   DOB: 1940-09-01, 75 y.o.   MRN: [id number]     Cardiology Office Note   Date:  04/28/2016   ID:  [name] [name], DOB 13-Nov-1940, MRN [id number]  PCP:  Georges Lynch, MD  Cardiologist:   Jenkins Rouge, MD   Chief Complaint  Patient presents with  . Atherosclerosis of aorta ([place])  . PVD (peripheral vascular disease) ([place])      History of Present Illness: [name] [name] is a 75 y.o. female who was first seen 12/10/15  for evaluation of palpitatons.  2013   Had dyspnea   Extensive w/u. Reviewed records. Had reticulonodular pattern on Xray. CT negative for PE. [name] Has vascular disease. [place] Previous LE stent and no current claudication. Has known carotid bruits and gets US neck every 6 months with no tight lesions. Had stress myovue in hospital 2013  which was normal. Had atypical chest pain post cervical laminectomy and is still weak. No current pain. Dyspnea improved Radiology indicates need for f/u CT for lung nodules. Primary is [facility] [place]  Referred to Dr Gwenlyn Found for PVD.   Angiography on her 01/31/16 revealing a physiologically significant distal left common iliac artery stenosis which was stented. She had a 60-70% focal mid left SFA stenosis with three-vessel runoff. Her postprocedure.Doppler showed improvement in her velocities in that area. Her claudication on the left has since resolved  Quit smoking 3 years ago  Seen by lipid clinic and started on zetia.    Past Medical History:  Diagnosis Date  . Anxiety   . Arthritis    RA  . Carpal tunnel syndrome, bilateral   . Chronic kidney disease    CKD stage III, absence of left kidney  . Congenital absence of one kidney    Pt has right kidney only  . Depression   . GERD (gastroesophageal reflux disease)   . Headache   . Heart murmur   . Hypercholesteremia   . Hypertension   . Peripheral vascular disease ([place])    a. s/p R external iliac stent '06 with angioplasty '13  (Dr. Benjamine Sprague) b. 01/2016: s/p PTA/stenting in L common iliac artery (Dr. Gwenlyn Found)  . Polymyalgia rheumatica ([place])   . Psoriasis     Past Surgical History:  Procedure Laterality Date  . ABDOMINAL HYSTERECTOMY  1963   Partial,  Due to bleeding after delivery  . ANTERIOR CERVICAL DECOMP/DISCECTOMY FUSION  04/19/2012   Procedure: ANTERIOR CERVICAL DECOMPRESSION/DISCECTOMY FUSION 2 LEVELS;  Surgeon: Winfield Cunas, MD;  Location: [facility];  Service: Neurosurgery;  Laterality: N/A;  Cervical four-five,Cervical five-six anterior cervical decompression with fusion plating and bonegraft possible posterior cervical decompression  . APPENDECTOMY    . CARPAL TUNNEL RELEASE  1990  . CERVICAL FUSION  04/19/2012  . EYE SURGERY    . ILIAC ARTERY STENT    . PERIPHERAL VASCULAR CATHETERIZATION N/A 01/31/2016   Procedure: Abdominal Aortogram;  Surgeon: [name], MD;  Location: [facility];  Service: Cardiovascular;  Laterality: N/A;  . PERIPHERAL VASCULAR CATHETERIZATION Bilateral 01/31/2016   Procedure: Lower Extremity Angiography;  Surgeon: [name], MD;  Location: [facility];  Service: Cardiovascular;  Laterality: Bilateral;  . PERIPHERAL VASCULAR CATHETERIZATION Left 01/31/2016   Procedure: Peripheral Vascular Intervention;  Surgeon: [name], MD;  Location: [facility];  Service: Cardiovascular;  Laterality: Left;  ILIAC  . TONSILLECTOMY  Current Outpatient Prescriptions  Medication Sig Dispense Refill  . aspirin EC 81 MG EC tablet Take 1 tablet (81 mg total) by mouth daily.    . chlorthalidone (HYGROTON) 25 MG tablet Take 1 tablet (25 mg total) by mouth daily. 30 tablet 11  . Cholecalciferol (VITAMIN D) 2000 UNITS tablet Take 2,000 Units by mouth daily.    . clopidogrel (PLAVIX) 75 MG tablet TAKE ONE TABLET BY MOUTH IN THE MORNING 90 tablet 3  . dorzolamide (TRUSOPT) 2 % ophthalmic solution Place 2 drops into both eyes daily.    [name] ezetimibe (ZETIA)  10 MG tablet Take 1 tablet (10 mg total) by mouth daily. 30 tablet 1  . fluticasone (FLONASE) 50 MCG/ACT nasal spray Place 1 spray into both nostrils daily as needed for allergies or rhinitis (congestion). 16 g 11  . latanoprost (XALATAN) 0.005 % ophthalmic solution Place 2 drops into both eyes daily.    [name] lisinopril (PRINIVIL,ZESTRIL) 10 MG tablet Take 1 tablet (10 mg total) by mouth daily. 90 tablet 3  . metoprolol tartrate (LOPRESSOR) 25 MG tablet Take 0.5 tablets (12.5 mg total) by mouth 2 (two) times daily. 30 tablet 11  . Multiple Vitamin (MULTIVITAMIN WITH MINERALS) TABS tablet Take 1 tablet by mouth daily.    . pantoprazole (PROTONIX) 20 MG tablet Take 1 tablet (20 mg total) by mouth daily. 90 tablet 3  . Polyethyl Glycol-Propyl Glycol (SYSTANE OP) Place 1 drop into both eyes 2 (two) times daily.    . sertraline (ZOLOFT) 100 MG tablet Take 1 tablet (100 mg total) by mouth 2 (two) times daily. 60 tablet 11  . sodium chloride (MURO 128) 5 % ophthalmic ointment Place 1 application into both eyes 3 (three) times daily.    . traMADol (ULTRAM) 50 MG tablet Take 2 tablets (100 mg total) by mouth 2 (two) times daily as needed for moderate pain. 60 tablet 0   No current facility-administered medications for this visit.     Allergies:   Cyclobenzaprine and Statins    Social History:  The patient  reports that she quit smoking about 5 years ago. Her smoking use included Cigarettes. She has a 40.00 pack-year smoking history. She has never used smokeless tobacco. She reports that she does not drink alcohol or use drugs.   Family History:  The patient's family history includes Angina in her mother; Cancer in her sister; Heart attack in her father; Melanoma in her brother.    ROS:  Please see the history of present illness.   Otherwise, review of systems are positive for none.   All other systems are reviewed and negative.    PHYSICAL EXAM: VS:  BP 130/64   Pulse 83   Ht 4\' 11"  (1.499 m)   Wt  67.4 kg (148 lb 9.6 oz)   SpO2 97%   BMI 30.01 kg/m  , BMI Body mass index is 30.01 kg/m. Affect appropriate Chronically ill white female  HEENT: normal Neck supple with no adenopathy JVP normal bilateral  bruits no thyromegaly Lungs clear with no wheezing and good diaphragmatic motion Heart:  S1/S2 AV sclerosis  murmur, no rub, gallop or click PMI normal Abdomen: benighn, BS positve, no tenderness, no AAA Bilateral femoral  bruit.  No HSM or HJR Distal pulses intact with no bruits No edema Neuro non-focal Chronic venous changes in LE;s  No muscular weakness    EKG:    10/20/14  SR rate 76 normal   Recent Labs: 10/18/2015: TSH 0.88 03/29/2016:  ALT 11; B Natriuretic Peptide 12.1; BUN 23; Creatinine, Ser 1.47; Hemoglobin 12.4; Platelets 151; Potassium 4.2; Sodium 141    Lipid Panel    Component Value Date/Time   CHOL 231 (H) 08/03/2015 1148   TRIG 166 (H) 08/03/2015 1148   HDL 46 08/03/2015 1148   CHOLHDL 5.0 08/03/2015 1148   VLDL 33 (H) 08/03/2015 1148   LDLCALC 152 (H) 08/03/2015 1148      Wt Readings from Last 3 Encounters:  04/28/16 67.4 kg (148 lb 9.6 oz)  04/07/16 67.1 kg (148 lb)  04/04/16 65.8 kg (145 lb)      Other studies Reviewed: Additional studies/ records that were reviewed today include: [facility] from [facility] and echo .    ASSESSMENT AND PLAN:  1.  Bruit:  F/U duplex in a year plaque no stenosis ASA/Plavix 2. PVD F/U Gwenlyn Found post stenting of left external iliac with residual 60% mid Left SFA improved claudication  3. HTN:  Well controlled.  Continue current medications and low sodium Dash type diet.   4. Chol:  Intolerant to statins started on zetia willing to do praluent if fails but may not be affordable for her  5. Benign ECG ok EF normal by echo last year with PVD no beta blocker at this time   [employer]

## 2016-04-27 ENCOUNTER — Encounter: Payer: Self-pay | Admitting: Pharmacist

## 2016-04-27 ENCOUNTER — Ambulatory Visit (INDEPENDENT_AMBULATORY_CARE_PROVIDER_SITE_OTHER): Payer: PPO | Admitting: Pharmacist

## 2016-04-27 DIAGNOSIS — I10 Essential (primary) hypertension: Secondary | ICD-10-CM

## 2016-04-27 MED ORDER — EZETIMIBE 10 MG PO TABS: 10.0000 mg | ORAL_TABLET | Freq: Every day | ORAL | 1 refills | Status: DC

## 2016-04-27 NOTE — Progress Notes (Signed)
Patient ID: ARY HELMAN                 DOB: [birth year]                    MRN: [id number]     HPI: [name] [name] is a 75 y.o. female patient of Dr. Gwenlyn Found with [name] below that presents today for lipid evaluation.  She has been seen by Dr. Gwenlyn Found for PAD. She states her symptoms have improved since her most recent stent placement, but that she still struggles to walk long distances due to the pain.   She states she is scared to retry statin due to the severity of the reaction she had to Lipitor.   CV Hx: PAD with stenting of iliac placed in 2008 and 2017, claudication  Risk Factors: HTN, age LDL Goal: <70  Current Medications: None for cholesterol Intolerances:  Lipitor - had tingling in tongue which progressed to swollen tongue and throat over time. She also had pains in her joints and bones which caused her to be limited with activity - all of which resolved after discontinuation of lipitor  Fenofibrate - dry mouth, muscle aching and tongue tingling - which resolved after discontinuation.   Diet: She states she really does not have an appetite most of the time. She occasionally eats eggs scrambled but states that she really does not like meats. She endorses eating gravy and bread and knows she should cut back on this. She does eat a lot of vegetables and fruit.   Exercise: She is active walking about 1/4 mile 3 times per day, though she says she is limited due to the pain and frequently needs to stop and sit for a few minutes during the walk.   Family History: Her mother had HLD, angina, heart murmur or irregular heart beat. Her father died at age 54 and this was his third MI. She has a sister with an irregular heart beat. All of her 7 siblings have HTN. She has had one brother pass from MI at 81 yo and another from melanoma at age 18.   Social History: She quit smoking 6 years ago on 2/14. She had a 25 year history of smoking. She denies smokeless tobacco and alcohol.   Labs: TC 231  TG 166   HDL 46  LDL 152   Past Medical History:  Diagnosis Date  . Anxiety   . Arthritis    RA  . Carpal tunnel syndrome, bilateral   . Chronic kidney disease    CKD stage III, absence of left kidney  . Congenital absence of one kidney    Pt has right kidney only  . Depression   . GERD (gastroesophageal reflux disease)   . Headache   . Heart murmur   . Hypercholesteremia   . Hypertension   . Peripheral vascular disease ([place])    a. s/p R external iliac stent '06 with angioplasty '13 (Dr. Benjamine Sprague) b. 01/2016: s/p PTA/stenting in L common iliac artery (Dr. Gwenlyn Found)  . Polymyalgia rheumatica ([place])   . Psoriasis     Current Outpatient Prescriptions on File Prior to Visit  Medication Sig Dispense Refill  . aspirin EC 81 MG EC tablet Take 1 tablet (81 mg total) by mouth daily.    . chlorthalidone (HYGROTON) 25 MG tablet Take 1 tablet (25 mg total) by mouth daily. 30 tablet 11  . Cholecalciferol (VITAMIN D) 2000 UNITS tablet Take 2,000 Units by mouth daily.    [name]  clopidogrel (PLAVIX) 75 MG tablet TAKE ONE TABLET BY MOUTH IN THE MORNING 90 tablet 3  . dorzolamide (TRUSOPT) 2 % ophthalmic solution Place 2 drops into both eyes daily.    . fluticasone (FLONASE) 50 MCG/ACT nasal spray Place 1 spray into both nostrils daily as needed for allergies or rhinitis (congestion). 16 g 11  . latanoprost (XALATAN) 0.005 % ophthalmic solution Place 2 drops into both eyes daily.    [name] lisinopril (PRINIVIL,ZESTRIL) 10 MG tablet Take 1 tablet (10 mg total) by mouth daily. 90 tablet 3  . metoprolol tartrate (LOPRESSOR) 25 MG tablet Take 0.5 tablets (12.5 mg total) by mouth 2 (two) times daily. (Patient not taking: Reported on 04/04/2016) 30 tablet 11  . Multiple Vitamin (MULTIVITAMIN WITH MINERALS) TABS tablet Take 1 tablet by mouth daily.    . pantoprazole (PROTONIX) 20 MG tablet Take 1 tablet (20 mg total) by mouth daily. 90 tablet 3  . Polyethyl Glycol-Propyl Glycol (SYSTANE OP) Place 1 drop into both eyes 2 (two)  times daily.    . predniSONE (DELTASONE) 20 MG tablet Take 2 tablets (40 mg total) by mouth daily with breakfast. 10 tablet 0  . sertraline (ZOLOFT) 100 MG tablet Take 1 tablet (100 mg total) by mouth 2 (two) times daily. 60 tablet 11  . sodium chloride (MURO 128) 5 % ophthalmic ointment Place 1 application into both eyes 3 (three) times daily.    . traMADol (ULTRAM) 50 MG tablet Take 2 tablets (100 mg total) by mouth 2 (two) times daily as needed for moderate pain. 60 tablet 0   No current facility-administered medications on file prior to visit.     Allergies  Allergen Reactions  . Cyclobenzaprine Itching    Possible reaction per pt  . Statins Swelling, Rash and Other (See Comments)    Swelling involving tongue; also causes muscle pain    Assessment/Plan: Hyperlipidemia: LDL not at goal <70. Would not recommend rechallenge with statin due to throat swelling. Discussed options she is hesitant about injectable therapy but agreeable if absolutely necessary. She is also concerned about the price of these medications. She is agreeable to trial on Zetia. Will have labs drawn 3 months after start Zetia. If unable to tolerate or we are unable to get to goal will pursue PCSK9i therapy at first of next year.  Thank you,  Lelan Pons. Patterson Hammersmith, [facility]  04/27/2016 10:49 AM

## 2016-04-27 NOTE — Patient Instructions (Addendum)
Start Zetia (ezetimibe 10mg ) daily Call the clinic with any issues or if you are unable to tolerate the Zetia.   Cholesterol Cholesterol is a white, waxy, fat-like substance needed by your body in small amounts. The liver makes all the cholesterol you need. Cholesterol is carried from the liver by the blood through the blood vessels. Deposits of cholesterol (plaque) may build up on blood vessel walls. These make the arteries narrower and stiffer. Cholesterol plaques increase the risk for heart attack and stroke.  You cannot feel your cholesterol level even if it is very high. The only way to know it is high is with a blood test. Once you know your cholesterol levels, you should keep a record of the test results. Work with your health care provider to keep your levels in the desired range.  WHAT DO THE RESULTS MEAN?  Total cholesterol is a rough measure of all the cholesterol in your blood.   LDL is the so-called bad cholesterol. This is the type that deposits cholesterol in the walls of the arteries. You want this level to be low.   HDL is the good cholesterol because it cleans the arteries and carries the LDL away. You want this level to be high.  Triglycerides are fat that the body can either burn for energy or store. High levels are closely linked to heart disease.  WHAT ARE THE DESIRED LEVELS OF CHOLESTEROL?  Total cholesterol below 200.   LDL below 100 for people at risk, below 70 for those at very high risk.   HDL above 50 is good, above 60 is best.   Triglycerides below 150.  HOW CAN I LOWER MY CHOLESTEROL?  Diet. Follow your diet programs as directed by your health care provider.   Choose fish or white meat chicken and [place], roasted or baked. Limit fatty cuts of red meat, fried foods, and processed meats, such as sausage and lunch meats.   Eat lots of fresh fruits and vegetables.  Choose whole grains, beans, pasta, potatoes, and cereals.   Use only small amounts  of olive, corn, or canola oils.   Avoid butter, mayonnaise, shortening, or palm kernel oils.  Avoid foods with trans fats.   Drink skim or nonfat milk and eat low-fat or nonfat yogurt and cheeses. Avoid whole milk, cream, ice cream, egg yolks, and full-fat cheeses.   Healthy desserts include angel food cake, ginger snaps, animal crackers, hard candy, popsicles, and low-fat or nonfat frozen yogurt. Avoid pastries, cakes, pies, and cookies.   Exercise. Follow your exercise programs as directed by your health care provider.   A regular program helps decrease LDL and raise HDL.   A regular program helps with weight control.   Do things that increase your activity level like gardening, walking, or taking the stairs. Ask your health care provider about how you can be more active in your daily life.   Medicine. Take medicine only as directed by your health care provider.   Medicine may be prescribed by your health care provider to help lower cholesterol and decrease the risk for heart disease.   If you have several risk factors, you may need medicine even if your levels are normal.   This information is not intended to replace advice given to you by your health care provider. Make sure you discuss any questions you have with your health care provider.   Document Released: 03/21/2001 Document Revised: 07/17/2014 Document Reviewed: 04/09/2013 Elsevier Interactive Patient Education [employer].

## 2016-04-28 ENCOUNTER — Encounter: Payer: Self-pay | Admitting: Cardiovascular Disease

## 2016-04-28 ENCOUNTER — Ambulatory Visit (INDEPENDENT_AMBULATORY_CARE_PROVIDER_SITE_OTHER): Payer: PPO | Admitting: Cardiovascular Disease

## 2016-04-28 VITALS — BP 130/64 | HR 83 | Ht 59.0 in | Wt 148.6 lb

## 2016-04-28 DIAGNOSIS — I739 Peripheral vascular disease, unspecified: Secondary | ICD-10-CM | POA: Diagnosis not present

## 2016-04-28 DIAGNOSIS — I700 Atherosclerosis of aorta: Secondary | ICD-10-CM

## 2016-04-28 NOTE — Patient Instructions (Addendum)

## 2016-05-01 ENCOUNTER — Telehealth: Payer: Self-pay | Admitting: Pharmacist

## 2016-05-01 NOTE — Telephone Encounter (Signed)
LM - returned call about tolerating Zetia. Per message patient is unable to tolerate.   Per previous discussion it appears she may be interested in PCSK9i therapy if unable to tolerate oral medications. Will discuss with her when call back.

## 2016-05-02 NOTE — Telephone Encounter (Signed)
Cannot tolerate ezetimibe 10 mg - is causing muscle aches, to the point where she isn't sleeping well and has trouble getting out of chair.  Also notes diarrhea, nausea and headache.    Will schedule her for labs in late December/early January with a January f/u appointment for Repatha initiation and assistance paperwork.

## 2016-05-04 ENCOUNTER — Ambulatory Visit (INDEPENDENT_AMBULATORY_CARE_PROVIDER_SITE_OTHER): Payer: PPO | Admitting: Family Medicine

## 2016-05-04 ENCOUNTER — Encounter: Payer: Self-pay | Admitting: Family Medicine

## 2016-05-04 DIAGNOSIS — E785 Hyperlipidemia, unspecified: Secondary | ICD-10-CM | POA: Diagnosis not present

## 2016-05-04 DIAGNOSIS — M353 Polymyalgia rheumatica: Secondary | ICD-10-CM | POA: Diagnosis not present

## 2016-05-04 MED ORDER — TRAMADOL HCL 50 MG PO TABS: 100.0000 mg | ORAL_TABLET | Freq: Two times a day (BID) | ORAL | 3 refills | Status: DC | PRN

## 2016-05-04 MED ORDER — SERTRALINE HCL 100 MG PO TABS: 50.0000 mg | ORAL_TABLET | Freq: Two times a day (BID) | ORAL | 11 refills | Status: DC

## 2016-05-04 NOTE — Patient Instructions (Signed)
It was a pleasure seeing you today in our clinic. Today we discussed your medications. Here is the treatment plan we have discussed and agreed upon together:   - Continue taking your medications as prescribed.

## 2016-05-04 NOTE — Progress Notes (Signed)
   HPI  CC: Patient is here to discuss her medications Patient is here to discuss her medications. She states that she has been doing well on her current medication regimen. No setbacks or side effects described at this time. Patient does say that the cardiologist had tried to start her on Zetia but she had significant nausea, diarrhea, and muscle pains with this. She is asking my opinion on an injectable type of lipid control.  ROS: Patient denies any headache, fever, chills, confusion, dizziness, chest pain, numbness, weakness, or paresthesias.  CC, SH/smoking status, and VS noted  Objective: BP (!) 112/58   Pulse 98   Temp 97.5 F (36.4 C) (Oral)   Ht 4\' 11"  (1.499 m)   Wt 148 lb 12.8 oz (67.5 kg)   BMI 30.05 kg/m  Gen: NAD, alert, cooperative, and pleasant. CV: RRR, no murmur Resp: CTAB, no wheezes, non-labored Ext: No edema, warm   Assessment and plan:  Hyperlipemia I've discontinued sciatica from patient's medication list. I've encouraged patient to look into injectable lipid control.  Polymyalgia rheumatica No new symptoms at this time. Medication refill provided.   Meds ordered this encounter  Medications  . sertraline (ZOLOFT) 100 MG tablet    Sig: Take 0.5-1 tablets (50-100 mg total) by mouth 2 (two) times daily. 1 tab in the morning and 1/2 tab in the evening.    Dispense:  60 tablet    Refill:  11  . traMADol (ULTRAM) 50 MG tablet    Sig: Take 2 tablets (100 mg total) by mouth 2 (two) times daily as needed for moderate pain.    Dispense:  60 tablet    Refill:  3     Elberta Leatherwood, MD,MS,  PGY3 05/04/2016 6:54 PM

## 2016-05-04 NOTE — Assessment & Plan Note (Signed)
No new symptoms at this time. Medication refill provided.

## 2016-05-04 NOTE — Assessment & Plan Note (Signed)
I've discontinued sciatica from patient's medication list. I've encouraged patient to look into injectable lipid control.

## 2016-06-12 ENCOUNTER — Ambulatory Visit (INDEPENDENT_AMBULATORY_CARE_PROVIDER_SITE_OTHER): Payer: PPO | Admitting: Family Medicine

## 2016-06-12 ENCOUNTER — Encounter: Payer: Self-pay | Admitting: Family Medicine

## 2016-06-12 VITALS — BP 125/70 | HR 72 | Temp 98.4°F | Ht 59.0 in | Wt 144.4 lb

## 2016-06-12 DIAGNOSIS — J040 Acute laryngitis: Secondary | ICD-10-CM | POA: Diagnosis not present

## 2016-06-12 NOTE — Progress Notes (Signed)
    SUBJECTIVE:  [name] [name] is a 75 y.o. female who complains of URI symptoms. Her symptoms initially started 3 weeks ago with runny nose, cough, sore throat, and recent voice hoarseness. Has been drinking ginger ale and drinking soups, has been doing "BRAT" diet as she previously had some loose stools. Her symptoms have been getting better but her cough persists. On Saturday she was arguing with her son and yelling and then her voice became more hoarse as well as her cough. Admits to some shortness of breath. States she has been wheezing "like crazy". Denies any fevers. States that her daughter has also been sick with similar symptoms. States her urine is light in color. She states she only has 1 kidney.   She denies a history of chest pain, dizziness, fevers, myalgias and weakness and denies a history of asthma. Patient does not smoke cigarettes.    OBJECTIVE: Blood pressure 125/70, pulse 72, temperature 98.4 F (36.9 C), temperature source Oral, height 4\' 11"  (1.499 m), weight 144 lb 6.4 oz (65.5 kg).  Physical Exam  Constitutional: She is oriented to person, place, and time. She appears well-developed and well-nourished.  HENT:  Head: Normocephalic and atraumatic.  Right Ear: Tympanic membrane and external ear normal.  Left Ear: Tympanic membrane and external ear normal.  Nose: Mucosal edema present.  Mouth/Throat: Oropharynx is clear and moist.  Mildly hoarse voice  Eyes: Conjunctivae and EOM are normal. Pupils are equal, round, and reactive to light.  Neck: Normal range of motion. Neck supple.  Cardiovascular: Normal rate and regular rhythm.   Pulmonary/Chest: Effort normal and breath sounds normal. No respiratory distress. She has no wheezes. She has no rales.  Abdominal: Soft. Bowel sounds are normal.  Musculoskeletal: She exhibits no edema.  Neurological: She is alert and oriented to person, place, and time.  Skin: Skin is warm and dry. Capillary refill takes less than 2 seconds.  No rash noted.  Psychiatric: She has a normal mood and affect. Her behavior is normal.    ASSESSMENT:  Laryngitis: Physical exam largely unremarkable with the exception of a nasal mucosal edema and a mildly hoarse voice. Normal pulmonary exam. Patient able to maintain her hydration and diet. Patient states she was "wheezing like crazy" earlier but lungs are clear. Gave patient option of obtaining chest XRAY but she would prefer to not to at this time.   PLAN: Symptomatic therapy suggested: push fluids, rest, gargle warm salt water, drink warm tea and honey, and encouraged Flonase use. She will call or return to clinic prn if these symptoms worsen or fail to improve as anticipated.  Smitty Cords, MD [place], PGY-2

## 2016-06-12 NOTE — Patient Instructions (Signed)
Laryngitis Introduction Laryngitis is inflammation of your vocal cords. This causes hoarseness, coughing, loss of voice, sore throat, or a dry throat. Your vocal cords are two bands of muscles that are found in your throat. When you speak, these cords come together and vibrate. These vibrations come out through your mouth as sound. When your vocal cords are inflamed, your voice sounds different. Laryngitis can be temporary (acute) or long-term (chronic). Most cases of acute laryngitis improve with time. Chronic laryngitis is laryngitis that lasts for more than three weeks. What are the causes? Acute laryngitis may be caused by:  A viral infection.  Lots of talking, yelling, or singing. This is also called vocal strain.  Bacterial infections. Chronic laryngitis may be caused by:  Vocal strain.  Injury to your vocal cords.  Acid reflux (gastroesophageal reflux disease or GERD).  Allergies.  Sinus infection.  Smoking.  Alcohol abuse.  Breathing in chemicals or dust.  Growths on the vocal cords. What increases the risk? Risk factors for laryngitis include:  Smoking.  Alcohol abuse.  Having allergies. What are the signs or symptoms? Symptoms of laryngitis may include:  Low, hoarse voice.  Loss of voice.  Dry cough.  Sore throat.  Stuffy nose. How is this diagnosed? Laryngitis may be diagnosed by:  Physical exam.  Throat culture.  Blood test.  Laryngoscopy. This procedure allows your health care provider to look at your vocal cords with a mirror or viewing tube. How is this treated? Treatment for laryngitis depends on what is causing it. Usually, treatment involves resting your voice and using medicines to soothe your throat. However, if your laryngitis is caused by a bacterial infection, you may need to take antibiotic medicine. If your laryngitis is caused by a growth, you may need to have a procedure to remove it. Follow these instructions at home:  Drink  enough fluid to keep your urine clear or pale yellow.  Breathe in moist air. Use a humidifier if you live in a dry climate.  Take medicines only as directed by your health care provider.  If you were prescribed an antibiotic medicine, finish it all even if you start to feel better.  Do not smoke cigarettes or electronic cigarettes. If you need help quitting, ask your health care provider.  Talk as little as possible. Also avoid whispering, which can cause vocal strain.  Write instead of talking. Do this until your voice is back to normal. Contact a health care provider if:  You have a fever.  You have increasing pain.  You have difficulty swallowing. Get help right away if:  You cough up blood.  You have trouble breathing. This information is not intended to replace advice given to you by your health care provider. Make sure you discuss any questions you have with your health care provider. Document Released: 06/26/2005 Document Revised: 12/02/2015 Document Reviewed: 12/09/2013  2017 Elsevier

## 2016-07-14 ENCOUNTER — Telehealth: Payer: Self-pay | Admitting: Pharmacist Clinician (PhC)/ Clinical Pharmacy Specialist

## 2016-07-14 DIAGNOSIS — E785 Hyperlipidemia, unspecified: Secondary | ICD-10-CM

## 2016-07-14 NOTE — Telephone Encounter (Signed)
Set appt for lipid clinic, order placed and labs mailed to patient, she will get drawn 1 week prior to appt.

## 2016-07-20 DIAGNOSIS — R300 Dysuria: Secondary | ICD-10-CM | POA: Diagnosis not present

## 2016-07-27 ENCOUNTER — Other Ambulatory Visit: Payer: Self-pay | Admitting: Family Medicine

## 2016-07-27 DIAGNOSIS — K219 Gastro-esophageal reflux disease without esophagitis: Secondary | ICD-10-CM

## 2016-07-31 ENCOUNTER — Other Ambulatory Visit: Payer: Self-pay | Admitting: *Deleted

## 2016-07-31 ENCOUNTER — Telehealth: Payer: Self-pay | Admitting: Cardiovascular Disease

## 2016-07-31 DIAGNOSIS — E785 Hyperlipidemia, unspecified: Secondary | ICD-10-CM | POA: Diagnosis not present

## 2016-07-31 LAB — HEPATIC FUNCTION PANEL
ALT: 7 U/L (ref 6–29)
AST: 16 U/L (ref 10–35)
Albumin: 4.4 g/dL (ref 3.6–5.1)
Alkaline Phosphatase: 60 U/L (ref 33–130)
Bilirubin, Direct: 0 mg/dL (ref <=0.2)
Indirect Bilirubin: 0.3 mg/dL (ref 0.2–1.2)
Total Bilirubin: 0.3 mg/dL (ref 0.2–1.2)
Total Protein: 6.7 g/dL (ref 6.1–8.1)

## 2016-07-31 LAB — LIPID PANEL
Cholesterol: 284 mg/dL — ABNORMAL HIGH (ref <200)
HDL: 62 mg/dL (ref >50)
LDL Cholesterol: 190 mg/dL — ABNORMAL HIGH (ref <100)
Total CHOL/HDL Ratio: 4.6 Ratio (ref <5.0)
Triglycerides: 161 mg/dL — ABNORMAL HIGH (ref <150)
VLDL: 32 mg/dL — ABNORMAL HIGH (ref <30)

## 2016-07-31 NOTE — Telephone Encounter (Signed)
Please call asap,pt is there and she needs an order.

## 2016-07-31 NOTE — Telephone Encounter (Signed)
[facility] lab line busy when dialed initially. I have released hepatic fxn and lipid panel orders. Called [facility] again and was able to leave msg advising on this and gave them direct triage line to call if concerns.

## 2016-08-01 ENCOUNTER — Telehealth: Payer: Self-pay | Admitting: *Deleted

## 2016-08-01 ENCOUNTER — Ambulatory Visit (INDEPENDENT_AMBULATORY_CARE_PROVIDER_SITE_OTHER): Payer: PPO | Admitting: Family Medicine

## 2016-08-01 ENCOUNTER — Ambulatory Visit: Payer: PPO

## 2016-08-01 VITALS — BP 136/78 | HR 72 | Temp 98.3°F | Ht 59.0 in | Wt 143.4 lb

## 2016-08-01 DIAGNOSIS — R531 Weakness: Secondary | ICD-10-CM

## 2016-08-01 DIAGNOSIS — Z87891 Personal history of nicotine dependence: Secondary | ICD-10-CM

## 2016-08-01 DIAGNOSIS — F32A Depression, unspecified: Secondary | ICD-10-CM

## 2016-08-01 DIAGNOSIS — F418 Other specified anxiety disorders: Secondary | ICD-10-CM

## 2016-08-01 DIAGNOSIS — R5383 Other fatigue: Secondary | ICD-10-CM | POA: Diagnosis not present

## 2016-08-01 DIAGNOSIS — F329 Major depressive disorder, single episode, unspecified: Secondary | ICD-10-CM

## 2016-08-01 DIAGNOSIS — F419 Anxiety disorder, unspecified: Secondary | ICD-10-CM

## 2016-08-01 LAB — CBC WITH DIFFERENTIAL/PLATELET
Basophils Absolute: 0 cells/uL (ref 0–200)
Basophils Relative: 0 %
Eosinophils Absolute: 144 cells/uL (ref 15–500)
Eosinophils Relative: 2 %
HCT: 37.8 % (ref 35.0–45.0)
Hemoglobin: 12.8 g/dL (ref 11.7–15.5)
Lymphocytes Relative: 28 %
Lymphs Abs: 2016 cells/uL (ref 850–3900)
MCH: 33.6 pg — ABNORMAL HIGH (ref 27.0–33.0)
MCHC: 33.9 g/dL (ref 32.0–36.0)
MCV: 99.2 fL (ref 80.0–100.0)
MPV: 10 fL (ref 7.5–12.5)
Monocytes Absolute: 720 cells/uL (ref 200–950)
Monocytes Relative: 10 %
Neutro Abs: 4320 cells/uL (ref 1500–7800)
Neutrophils Relative %: 60 %
Platelets: 192 10*3/uL (ref 140–400)
RBC: 3.81 MIL/uL (ref 3.80–5.10)
RDW: 13.7 % (ref 11.0–15.0)
WBC: 7.2 10*3/uL (ref 3.8–10.8)

## 2016-08-01 LAB — COMPLETE METABOLIC PANEL WITH GFR
ALT: 7 U/L (ref 6–29)
AST: 17 U/L (ref 10–35)
Albumin: 4.2 g/dL (ref 3.6–5.1)
Alkaline Phosphatase: 58 U/L (ref 33–130)
BUN: 19 mg/dL (ref 7–25)
CO2: 25 mmol/L (ref 20–31)
Calcium: 9.4 mg/dL (ref 8.6–10.4)
Chloride: 103 mmol/L (ref 98–110)
Creat: 1.07 mg/dL — ABNORMAL HIGH (ref 0.60–0.93)
GFR, Est African American: 58 mL/min — ABNORMAL LOW (ref >=60)
GFR, Est Non African American: 50 mL/min — ABNORMAL LOW (ref >=60)
Glucose, Bld: 75 mg/dL (ref 65–99)
Potassium: 3.9 mmol/L (ref 3.5–5.3)
Sodium: 138 mmol/L (ref 135–146)
Total Bilirubin: 0.4 mg/dL (ref 0.2–1.2)
Total Protein: 6.7 g/dL (ref 6.1–8.1)

## 2016-08-01 LAB — T4, FREE: Free T4: 0.8 ng/dL (ref 0.8–1.8)

## 2016-08-01 LAB — TSH: TSH: 1.02 mIU/L

## 2016-08-01 NOTE — Telephone Encounter (Signed)
Patient called this morning stating she is having a problem with one of her medications.  Patient thinks she is having a reaction to the Protonix.  She is reporting weakness, fatigue, joint problems, urine problems, loss of appetite and diarrhea.  At times she has a low grade temp and heart fluttering.  Patient denies any problems breathing.  She has been seen in clinic several times and an urgent care near her home.  Advised patient that she should be seen today by a provider.  Appointment today at 1:45 PM with Dr. Juanito Doom.  Will forward chart to [facility] and PCP.  Derl Barrow, RN

## 2016-08-01 NOTE — Assessment & Plan Note (Signed)
PHQ-9 score of 20 and GAD 7 of 20. No SI/HI. See fatigue A/P.

## 2016-08-01 NOTE — Assessment & Plan Note (Signed)
>>  ASSESSMENT AND PLAN FOR GENERALIZED WEAKNESS WRITTEN ON 08/01/2016  3:44 PM BY Florinda Hush, MD  This is her most prominent symptom among multiple other complaints today which I think are likely all related to depression and anxiety. PHQ9 of 20, denies any suicidal ideations but does have a passive death wish. GAD7 of 20. No focal abnormalities on physical exam other than patient being tearful at times throughout visit. -Will need to rule out thyroid  abnormalities (she has thyroid  nodule on her past medical history) we'll collect TSH and free T4 today -Would also like to rule out underlying malignant process as she is a previous smoker, will get low-dose CT chest  -Will get CBC and BMP today  -Advised patient to follow-up with PCP as soon as possible to discuss depression and anxiety. Would recommend incorporating behavioral health consultant at future visit and possibly increasing Zoloft  and/or changing medications

## 2016-08-01 NOTE — Assessment & Plan Note (Addendum)
This is her most prominent symptom among multiple other complaints today which I think are likely all related to depression and anxiety. PHQ9 of 20, denies any suicidal ideations but does have a passive death wish. GAD7 of 20. No focal abnormalities on physical exam other than patient being tearful at times throughout visit. -Will need to rule out thyroid abnormalities (she has thyroid nodule on her past medical history) we'll collect TSH and free T4 today -Would also like to rule out underlying malignant process as she is a previous smoker, will get low-dose CT chest  -Will get CBC and BMP today  -Advised patient to follow-up with PCP as soon as possible to discuss depression and anxiety. Would recommend incorporating behavioral health consultant at future visit and possibly increasing Zoloft and/or changing medications

## 2016-08-01 NOTE — Patient Instructions (Signed)
Thank you for coming in today, it was so nice to see you! Today we talked about your symptoms. I am not sure what is causing this:   Please follow up ASAP with Dr. Alease Frame to discuss your depression. You can schedule this appointment at the front desk before you leave or call the clinic.  Bring in all your medications or supplements to each appointment for review.   If we ordered any tests today, you will be notified via telephone of any abnormalities. If everything is normal you will get a letter in the mail.   If you have any questions or concerns, please do not hesitate to call the office at [phone]. You can also message me directly via MyChart.   Sincerely,  Smitty Cords, MD

## 2016-08-01 NOTE — Progress Notes (Signed)
Subjective:    Patient ID: [name] [name] , female   DOB: 09-03-1940 , 76 y.o..   MRN: [id number]  HPI  [name] [name] is here for  Chief Complaint  Patient presents with  . Medication Reaction   Patient is here today with multiple complaints which she feels are related to possibly taking her PPI medicine because that is what a pharmacist told her. Her most prominent symptom is fatigue weak spells, but she also endorses heartburn, diarrhea, vomiting, no appetite, dysuria with odor, joint pains, headache for the last 3 months. She notes that the symptoms have been mostly constant but last weekend they went away. She notes that she has been drinking boost but is still getting diarrhea. Denies any hemoptysis or hematochezia or dark stools. Her weight has been stable. She notes that some days she feels like she can't even get out of bed. She denies any current smoking. Admits to cancer history including basal cell cancer which she is seeing a dermatologist for. She is not currently smoking but she quit smoking 6 years ago. She notes that she did have a spot on her right lung but does not remember what workup she had for that. She admits that she has obstructive sleep apnea but doesn't wear her CPAP. She also admits that at one point she had a thyroid nodule and did have some workup for that.  Patient endorses depression and anxiety. She notes that sometimes she just doesn't want to live anymore. Denies any current suicidal ideations or homicidal ideations.  Review of Systems: Per HPI. All other systems reviewed and are negative.  There are no preventive care reminders to display for this patient.  Past Medical History: Patient Active Problem List   Diagnosis Date Noted  . Peripheral vascular disease ([place])   . Claudication ([place]) 01/31/2016  . Fatigue 10/18/2015  . Right knee pain 10/18/2015  . Acrochordon 08/03/2015  . Stasis dermatitis of both legs 01/18/2015  . Radiculopathy of lumbar region  01/06/2015  . Seborrheic keratosis 11/24/2014  . OSA (obstructive sleep apnea) 09/21/2014  . Osteoporosis 06/29/2014  . Thyroid nodule 06/29/2014  . Breast calcifications 06/29/2014  . Glaucoma 01/08/2014  . Trigeminal neuralgia 12/18/2013  . Basal cell carcinoma 11/12/2013  . Presbyopia 10/22/2013  . Resting tremor 10/14/2013  . Hearing loss 10/13/2013  . Polymyalgia rheumatica ([place]) 09/11/2013  . GERD (gastroesophageal reflux disease) 09/11/2013  . Rash and nonspecific skin eruption 07/28/2013  . Pulmonary nodules 05/24/2012  . Carotid bruit 04/30/2012  . Hypertension 04/27/2012  . Anxiety and depression 04/27/2012  . Hyperlipemia 04/27/2012  . Cervical spondylosis with radiculopathy 04/19/2012    Medications: reviewed and updated Current Outpatient Prescriptions  Medication Sig Dispense Refill  . aspirin EC 81 MG EC tablet Take 1 tablet (81 mg total) by mouth daily.    . chlorthalidone (HYGROTON) 25 MG tablet Take 1 tablet (25 mg total) by mouth daily. 30 tablet 11  . Cholecalciferol (VITAMIN D) 2000 UNITS tablet Take 2,000 Units by mouth daily.    . clopidogrel (PLAVIX) 75 MG tablet TAKE ONE TABLET BY MOUTH IN THE MORNING 90 tablet 3  . dorzolamide (TRUSOPT) 2 % ophthalmic solution Place 2 drops into both eyes daily.    [name] latanoprost (XALATAN) 0.005 % ophthalmic solution Place 2 drops into both eyes daily.    [name] lisinopril (PRINIVIL,ZESTRIL) 10 MG tablet Take 1 tablet (10 mg total) by mouth daily. 90 tablet 3  . metoprolol tartrate (LOPRESSOR) 25 MG  tablet Take 0.5 tablets (12.5 mg total) by mouth 2 (two) times daily. 30 tablet 11  . pantoprazole (PROTONIX) 20 MG tablet TAKE ONE TABLET BY MOUTH ONCE DAILY 90 tablet 1  . sertraline (ZOLOFT) 100 MG tablet Take 0.5-1 tablets (50-100 mg total) by mouth 2 (two) times daily. 1 tab in the morning and 1/2 tab in the evening. 60 tablet 11  . traMADol (ULTRAM) 50 MG tablet Take 2 tablets (100 mg total) by mouth 2 (two) times daily as  needed for moderate pain. 60 tablet 3  . fluticasone (FLONASE) 50 MCG/ACT nasal spray Place 1 spray into both nostrils daily as needed for allergies or rhinitis (congestion). (Patient not taking: Reported on 08/01/2016) 16 g 11  . Multiple Vitamin (MULTIVITAMIN WITH MINERALS) TABS tablet Take 1 tablet by mouth daily.    [name] (SYSTANE OP) Place 1 drop into both eyes 2 (two) times daily.    . sodium chloride (MURO 128) 5 % ophthalmic ointment Place 1 application into both eyes 3 (three) times daily.     No current facility-administered medications for this visit.     Social Hx:  reports that she quit smoking about 6 years ago. Her smoking use included Cigarettes. She has a 40.00 pack-year smoking history. She has never used smokeless tobacco.   Objective:   BP 136/78   Pulse 72   Temp 98.3 F (36.8 C) (Oral)   Ht 4\' 11"  (1.499 m)   Wt 143 lb 6.4 oz (65 kg)   SpO2 97%   BMI 28.96 kg/m  Physical Exam  Gen: NAD, alert, cooperative with exam, well-groomed HEENT: NCAT, PERRL, clear conjunctiva, oropharynx clear, supple neck Cardiac: Regular rate and rhythm, normal S1/S2, no murmur, no edema, capillary refill brisk  Respiratory: Clear to auscultation bilaterally, no wheezes, non-labored breathing Gastrointestinal: soft, non tender, non distended, bowel sounds present Skin: no rashes, normal turgor  Neurological: no gross deficits.  Psych: depressed mood, tearful affect, normal speech, denies SI/HI  Depression screen [facility] 2/9 08/01/2016  Decreased Interest 2  Down, Depressed, Hopeless 3  PHQ - 2 Score 5  Altered sleeping 3  Tired, decreased energy 2  Change in appetite 3  Feeling bad or failure about yourself  3  Trouble concentrating 2  Moving slowly or fidgety/restless 1  Suicidal thoughts 1  PHQ-9 Score 20  Difficult doing work/chores Somewhat difficult    Assessment & Plan:  Fatigue This is her most prominent symptom among multiple other  complaints today which I think are likely all related to depression and anxiety. PHQ9 of 20, denies any suicidal ideations but does have a passive death wish. GAD7 of 20. No focal abnormalities on physical exam other than patient being tearful at times throughout visit. -Will need to rule out thyroid abnormalities (she has thyroid nodule on her past medical history) we'll collect TSH and free T4 today -Would also like to rule out underlying malignant process as she is a previous smoker, will get low-dose CT chest  -Will get CBC and BMP today  -Advised patient to follow-up with PCP as soon as possible to discuss depression and anxiety. Would recommend incorporating behavioral health consultant at future visit and possibly increasing Zoloft and/or changing medications  Anxiety and depression PHQ-9 score of 20 and GAD 7 of 20. No SI/HI. See fatigue A/P.    Smitty Cords, MD [place], PGY-2

## 2016-08-02 ENCOUNTER — Telehealth: Payer: Self-pay | Admitting: Family Medicine

## 2016-08-02 NOTE — Telephone Encounter (Signed)
Order changed.  Thank you.

## 2016-08-02 NOTE — Telephone Encounter (Signed)
[facility] called because we need to change the diagnose code to former smoker and not weakness. They only need for [place] to change code so that they see the patient and the insurance cover this visit, jw

## 2016-08-04 ENCOUNTER — Telehealth: Payer: Self-pay | Admitting: Cardiovascular Disease

## 2016-08-04 NOTE — Telephone Encounter (Signed)
Lab order slips were returned via mail service. According to note from 1/22, it looks like labs were already done on that day. Pt no longer needs lab slips.  Routing to [place] as FYI for appt coming up on 1/30 with lipid clinic.

## 2016-08-08 ENCOUNTER — Ambulatory Visit: Payer: PPO

## 2016-08-09 ENCOUNTER — Other Ambulatory Visit: Payer: Self-pay | Admitting: Family Medicine

## 2016-08-09 ENCOUNTER — Telehealth: Payer: Self-pay | Admitting: Family Medicine

## 2016-08-09 ENCOUNTER — Ambulatory Visit
Admission: RE | Admit: 2016-08-09 | Discharge: 2016-08-09 | Disposition: A | Discharge status: Home / Self Care | Payer: PPO | Source: Ambulatory Visit | Attending: Family Medicine | Admitting: Family Medicine

## 2016-08-09 DIAGNOSIS — Z87891 Personal history of nicotine dependence: Secondary | ICD-10-CM

## 2016-08-09 NOTE — Telephone Encounter (Signed)
[facility] called about pt ct scan was not covered under insurance. They need a new order sent with ct-chest w/out. [name] [name]

## 2016-08-10 ENCOUNTER — Other Ambulatory Visit: Payer: Self-pay | Admitting: Family Medicine

## 2016-08-10 DIAGNOSIS — Z1231 Encounter for screening mammogram for malignant neoplasm of breast: Secondary | ICD-10-CM

## 2016-08-16 ENCOUNTER — Encounter: Payer: Self-pay | Admitting: Family Medicine

## 2016-08-16 ENCOUNTER — Ambulatory Visit (INDEPENDENT_AMBULATORY_CARE_PROVIDER_SITE_OTHER): Payer: PPO | Admitting: Family Medicine

## 2016-08-16 VITALS — BP 128/62 | HR 81 | Temp 98.5°F | Ht 59.0 in | Wt 145.6 lb

## 2016-08-16 DIAGNOSIS — R5383 Other fatigue: Secondary | ICD-10-CM

## 2016-08-16 DIAGNOSIS — Z87891 Personal history of nicotine dependence: Secondary | ICD-10-CM | POA: Diagnosis not present

## 2016-08-16 NOTE — Patient Instructions (Addendum)
It was a pleasure seeing you today in our clinic. Today we discussed your fatigue. Here is the treatment plan we have discussed and agreed upon together:   - I have placed an order for a chest CT. My nurse will have this scheduled before you leave here today. - I have placed a referral for you to obtain a sleep study. - I have placed an order to get a echocardiogram of your heart. - I would like for you to begin weaning yourself off of your Zoloft. Beginning today take one half tablet twice a day. In one week, 08/23/16, reduce this dose to one half tablet once a day. Continue this for 3 days, then discontinue this medication use altogether.

## 2016-08-16 NOTE — Progress Notes (Signed)
fu

## 2016-08-16 NOTE — Progress Notes (Signed)
HPI  CC: Fatigue Patient is here to follow-up on her fatigue. She states that she is still very worn out and weak throughout her days. She feels like she is not sleeping very well. She has very little energy and states that she feels as though her depression is worsening. Denies SI/HI. Denies any new cough, shortness of breath, chest pain, nausea, vomiting, or diarrhea. She denies any melena or hematochezia. She endorses good compliance with all of her medications.  Of note: Patient is a prior smoker. She has an estimated 45+-pack-year history of smoking. She quit smoking in 2012.   Review of Systems    See HPI for ROS. All other systems reviewed and are negative.  CC, SH/smoking status, and VS noted  Objective: BP 128/62   Pulse 81   Temp 98.5 F (36.9 C) (Oral)   Ht 4\' 11"  (1.499 m)   Wt 145 lb 9.6 oz (66 kg)   SpO2 99%   BMI 29.41 kg/m  Gen: NAD, alert, cooperative, and pleasant. HEENT: NCAT, EOMI, PERRL CV: RRR, no murmur Resp: Poor overall air movement, CTAB, no wheezes, non-labored Abd: SNTND, BS present, no guarding or organomegaly Ext: slight edema noted bilaterally, warm, pulses intact.  Neuro: Alert and oriented, Speech clear, No gross deficits  Assessment and plan:  Fatigue Patient is here with continued/persistent fatigue. She has had no improvement over the past few months. I reviewed recent labs and there is no significant abnormal lab finding that I'm seeing I could help determine this etiology. Differential includes depression vs. New heart failure, vs OSA, vs. Idiopathic. - I placed an order for an echocardiogram. - Low-dose chest CT due to smoking history - Referral to sleep medicine. - Discontinuing Zoloft. It is to wean off. 1 tablet daily beginning now for 1 week. One half tablet daily for an additional 3-4 days. Then discontinue altogether.  Next: Patient was asked to follow-up in 2 weeks, at that time I will restart a new antidepressant.   Orders  Placed This Encounter  Procedures  . CT CHEST LUNG CA SCREEN LOW DOSE W/O CM    Patient had a weight loss but it was only 3lbs in 3 months    Standing Status:   Future    Standing Expiration Date:   10/14/2017    Order Specific Question:   Reason for Exam (SYMPTOM  OR DIAGNOSIS REQUIRED)    Answer:   >45 pack year smoking history; new fatigue  . Ambulatory referral to Sleep Studies    Referral Priority:   Routine    Referral Type:   Consultation    Referral Reason:   Specialty Services Required    Number of Visits Requested:   1  . ECHOCARDIOGRAM COMPLETE    Worsening Fatigue.    Standing Status:   Future    Standing Expiration Date:   11/13/2017    Order Specific Question:   Where should this test be performed    Answer:   [facility])    Order Specific Question:   Does the patient weigh less than or greater than 250 lbs?    Answer:   Patient weighs less than 250 lbs    Order Specific Question:   Complete or Limited study?    Answer:   Complete    Order Specific Question:   Does the patient have a known history of hypersensitivity to Perflutren (aka Definity-Image Enhancing Agent for echocardiograms - CHECK ALLERGIES)    Answer:  No    Order Specific Question:   ADMINISTER PERFLUTERN    Answer:   ADMINISTER PERFLUTREN    Order Specific Question:   Expected Date:    Answer:   1 week    Order Specific Question:   Reason for exam-Echo    Answer:   Other - See Comments Section     Elberta Leatherwood, MD,MS,  PGY3 08/16/2016 6:01 PM

## 2016-08-16 NOTE — Assessment & Plan Note (Signed)
Patient is here with continued/persistent fatigue. She has had no improvement over the past few months. I reviewed recent labs and there is no significant abnormal lab finding that I'm seeing I could help determine this etiology. Differential includes depression vs. New heart failure, vs OSA, vs. Idiopathic. - I placed an order for an echocardiogram. - Low-dose chest CT due to smoking history - Referral to sleep medicine. - Discontinuing Zoloft. It is to wean off. 1 tablet daily beginning now for 1 week. One half tablet daily for an additional 3-4 days. Then discontinue altogether.  Next: Patient was asked to follow-up in 2 weeks, at that time I will restart a new antidepressant.

## 2016-08-16 NOTE — Assessment & Plan Note (Signed)
>>  ASSESSMENT AND PLAN FOR GENERALIZED WEAKNESS WRITTEN ON 08/16/2016  6:01 PM BY MCKEAG, IAN D, MD  Patient is here with continued/persistent fatigue. She has had no improvement over the past few months. I reviewed recent labs and there is no significant abnormal lab finding that I'm seeing I could help determine this etiology. Differential includes depression vs. New heart failure, vs OSA, vs. Idiopathic. - I placed an order for an echocardiogram. - Low-dose chest CT due to smoking history - Referral to sleep medicine. - Discontinuing Zoloft . It is to wean off. 1 tablet daily beginning now for 1 week. One half tablet daily for an additional 3-4 days. Then discontinue altogether.  Next: Patient was asked to follow-up in 2 weeks, at that time I will restart a new antidepressant.

## 2016-08-18 ENCOUNTER — Telehealth: Payer: Self-pay | Admitting: Family Medicine

## 2016-08-18 ENCOUNTER — Encounter (HOSPITAL_COMMUNITY): Payer: Self-pay

## 2016-08-18 ENCOUNTER — Ambulatory Visit (HOSPITAL_COMMUNITY)
Admission: RE | Admit: 2016-08-18 | Discharge: 2016-08-18 | Disposition: A | Discharge status: Home / Self Care | Payer: PPO | Source: Ambulatory Visit | Attending: Family Medicine | Admitting: Family Medicine

## 2016-08-18 DIAGNOSIS — R918 Other nonspecific abnormal finding of lung field: Secondary | ICD-10-CM | POA: Insufficient documentation

## 2016-08-18 DIAGNOSIS — Z87891 Personal history of nicotine dependence: Secondary | ICD-10-CM | POA: Insufficient documentation

## 2016-08-18 DIAGNOSIS — R0602 Shortness of breath: Secondary | ICD-10-CM | POA: Diagnosis not present

## 2016-08-18 MED ORDER — IOPAMIDOL (ISOVUE-300) INJECTION 61%
INTRAVENOUS | Status: AC
  Administered 2016-08-18: 75 mL
  Filled 2016-08-18: qty 75

## 2016-08-18 NOTE — Telephone Encounter (Signed)
Received call from [name] [name]. Patient unable to get screening CT as she is currently having symptoms. Changed to CT Chest with contrast.

## 2016-08-22 DIAGNOSIS — L820 Inflamed seborrheic keratosis: Secondary | ICD-10-CM | POA: Diagnosis not present

## 2016-08-22 DIAGNOSIS — L821 Other seborrheic keratosis: Secondary | ICD-10-CM | POA: Diagnosis not present

## 2016-08-22 DIAGNOSIS — D225 Melanocytic nevi of trunk: Secondary | ICD-10-CM | POA: Diagnosis not present

## 2016-08-22 DIAGNOSIS — L570 Actinic keratosis: Secondary | ICD-10-CM | POA: Diagnosis not present

## 2016-08-25 ENCOUNTER — Ambulatory Visit (HOSPITAL_COMMUNITY): Payer: PPO | Attending: Cardiology

## 2016-08-25 ENCOUNTER — Other Ambulatory Visit: Payer: Self-pay

## 2016-08-25 DIAGNOSIS — R5383 Other fatigue: Secondary | ICD-10-CM | POA: Insufficient documentation

## 2016-08-25 DIAGNOSIS — I517 Cardiomegaly: Secondary | ICD-10-CM | POA: Diagnosis not present

## 2016-08-25 DIAGNOSIS — I5189 Other ill-defined heart diseases: Secondary | ICD-10-CM | POA: Diagnosis not present

## 2016-08-25 LAB — ECHOCARDIOGRAM COMPLETE
Ao-asc: 33 cm
E decel time: 282 msec
E/e' ratio: 14.83
FS: 35 % (ref 28–44)
IVS/LV PW RATIO, ED: 1.24
LA ID, A-P, ES: 29 mm
LA diam end sys: 29 mm
LA diam index: 1.8 cm/m2
LA vol A4C: 25 ml
LA vol index: 14.9 mL/m2
LA vol: 24 mL
LV E/e' medial: 14.83
LV E/e'average: 14.83
LV PW d: 8.74 mm — AB (ref 0.6–1.1)
LV e' LATERAL: 5.26 cm/s
LVOT SV: 77 mL
LVOT VTI: 18.5 cm
LVOT area: 4.15 cm2
LVOT diameter: 23 mm
LVOT peak vel: 90.2 cm/s
MV Dec: 282
MV Peak grad: 2 mmHg
MV pk A vel: 115 m/s
MV pk E vel: 78 m/s
TDI e' lateral: 5.26
TDI e' medial: 7.02

## 2016-08-26 ENCOUNTER — Other Ambulatory Visit: Payer: Self-pay

## 2016-08-26 ENCOUNTER — Emergency Department (HOSPITAL_COMMUNITY): Payer: PPO

## 2016-08-26 ENCOUNTER — Encounter (HOSPITAL_COMMUNITY): Payer: Self-pay | Admitting: Emergency Medicine

## 2016-08-26 ENCOUNTER — Emergency Department (HOSPITAL_COMMUNITY)
Admission: EM | Admit: 2016-08-26 | Discharge: 2016-08-26 | Disposition: A | Discharge status: Home / Self Care | Payer: PPO | Attending: Emergency Medicine | Admitting: Emergency Medicine

## 2016-08-26 DIAGNOSIS — R531 Weakness: Secondary | ICD-10-CM | POA: Diagnosis not present

## 2016-08-26 DIAGNOSIS — I129 Hypertensive chronic kidney disease with stage 1 through stage 4 chronic kidney disease, or unspecified chronic kidney disease: Secondary | ICD-10-CM | POA: Diagnosis not present

## 2016-08-26 DIAGNOSIS — Z79899 Other long term (current) drug therapy: Secondary | ICD-10-CM | POA: Diagnosis not present

## 2016-08-26 DIAGNOSIS — N183 Chronic kidney disease, stage 3 (moderate): Secondary | ICD-10-CM | POA: Diagnosis not present

## 2016-08-26 DIAGNOSIS — Z87891 Personal history of nicotine dependence: Secondary | ICD-10-CM | POA: Diagnosis not present

## 2016-08-26 DIAGNOSIS — R0689 Other abnormalities of breathing: Secondary | ICD-10-CM | POA: Diagnosis not present

## 2016-08-26 DIAGNOSIS — Z7982 Long term (current) use of aspirin: Secondary | ICD-10-CM | POA: Diagnosis not present

## 2016-08-26 LAB — URINALYSIS, ROUTINE W REFLEX MICROSCOPIC
Bilirubin Urine: NEGATIVE
Glucose, UA: NEGATIVE mg/dL
Hgb urine dipstick: NEGATIVE
Ketones, ur: NEGATIVE mg/dL
Leukocytes, UA: NEGATIVE
Nitrite: NEGATIVE
Protein, ur: NEGATIVE mg/dL
Specific Gravity, Urine: 1.004 — ABNORMAL LOW (ref 1.005–1.030)
pH: 6 (ref 5.0–8.0)

## 2016-08-26 LAB — CBC WITH DIFFERENTIAL/PLATELET
Basophils Absolute: 0 10*3/uL (ref 0.0–0.1)
Basophils Relative: 0 %
Eosinophils Absolute: 0.1 10*3/uL (ref 0.0–0.7)
Eosinophils Relative: 2 %
HCT: 33.4 % — ABNORMAL LOW (ref 36.0–46.0)
Hemoglobin: 11.6 g/dL — ABNORMAL LOW (ref 12.0–15.0)
Lymphocytes Relative: 26 %
Lymphs Abs: 1.6 10*3/uL (ref 0.7–4.0)
MCH: 33 pg (ref 26.0–34.0)
MCHC: 34.7 g/dL (ref 30.0–36.0)
MCV: 94.9 fL (ref 78.0–100.0)
Monocytes Absolute: 0.4 10*3/uL (ref 0.1–1.0)
Monocytes Relative: 6 %
Neutro Abs: 4.1 10*3/uL (ref 1.7–7.7)
Neutrophils Relative %: 66 %
Platelets: 172 10*3/uL (ref 150–400)
RBC: 3.52 MIL/uL — ABNORMAL LOW (ref 3.87–5.11)
RDW: 13.1 % (ref 11.5–15.5)
WBC: 6.2 10*3/uL (ref 4.0–10.5)

## 2016-08-26 LAB — COMPREHENSIVE METABOLIC PANEL
ALT: 12 U/L — ABNORMAL LOW (ref 14–54)
AST: 22 U/L (ref 15–41)
Albumin: 4.1 g/dL (ref 3.5–5.0)
Alkaline Phosphatase: 64 U/L (ref 38–126)
Anion gap: 6 (ref 5–15)
BUN: 26 mg/dL — ABNORMAL HIGH (ref 6–20)
CO2: 27 mmol/L (ref 22–32)
Calcium: 9.8 mg/dL (ref 8.9–10.3)
Chloride: 104 mmol/L (ref 101–111)
Creatinine, Ser: 1.22 mg/dL — ABNORMAL HIGH (ref 0.44–1.00)
GFR calc Af Amer: 48 mL/min — ABNORMAL LOW (ref >60)
GFR calc non Af Amer: 42 mL/min — ABNORMAL LOW (ref >60)
Glucose, Bld: 93 mg/dL (ref 65–99)
Potassium: 4.3 mmol/L (ref 3.5–5.1)
Sodium: 137 mmol/L (ref 135–145)
Total Bilirubin: 0.6 mg/dL (ref 0.3–1.2)
Total Protein: 7 g/dL (ref 6.5–8.1)

## 2016-08-26 LAB — TROPONIN I: Troponin I: <0.03 ng/mL (ref <0.03)

## 2016-08-26 LAB — ETHANOL: Alcohol, Ethyl (B): <5 mg/dL (ref <5)

## 2016-08-26 LAB — LIPASE, BLOOD: Lipase: 32 U/L (ref 11–51)

## 2016-08-26 LAB — BRAIN NATRIURETIC PEPTIDE: B Natriuretic Peptide: 12.9 pg/mL (ref 0.0–100.0)

## 2016-08-26 MED ORDER — ACETAMINOPHEN 325 MG PO TABS
650.0000 mg | ORAL_TABLET | Freq: Once | ORAL | Status: AC
  Administered 2016-08-26: 650 mg via ORAL
  Filled 2016-08-26: qty 2

## 2016-08-26 NOTE — ED Notes (Signed)
Main lab was called to draw a CBC on the pt. Lab tech Sharyn Lull will be done in a few. Nurse aware.

## 2016-08-26 NOTE — ED Triage Notes (Signed)
Per pt/family-states she has been getting weaker and weaker on and off for months-went and had echo done yesterday-multiple complaints

## 2016-08-26 NOTE — ED Notes (Signed)
Patient presents today from home with her daughter. Her daughter lives with her. She has complained of this worsening fatigue for past several weeks and has had work-ups through her primary care doctor. She has recent echo and ct results in her chart. Recent bloodwork was unremarkable, tsh levels wdl.  She says she has orthopnea, phlegm worse in the morning, yellow in color. She was recently started tapering off her zoloft by her pcp with the intent of transitioning to another SSRI. She has not yet started the new SSRI and feels that during the taper her symptoms have worsened but were present prior to beginning the taper.

## 2016-08-26 NOTE — Discharge Instructions (Signed)
As discussed, your evaluation today has been largely reassuring.  But, it is important that you monitor your condition carefully, and do not hesitate to return to the ED if you develop new, or concerning changes in your condition. ? ?Otherwise, please follow-up with your physician for appropriate ongoing care. ? ?

## 2016-08-26 NOTE — ED Provider Notes (Signed)
[facility] DEPT Provider Note   CSN: [place]:[id number] Arrival date & time: 08/26/16  0945     History   Chief Complaint Chief Complaint  Patient presents with  . Fatigue    HPI NAMI VANDERHAM is a 76 y.o. female.  HPI  Patient presents with concern of weakness. She is here with her daughter assists with the history of present illness. The patient notes that over the past few weeks, possibly months she has had increasing fatigue, without new pain.  Patient does have mild cough, nothing sustained. Otherwise, no overtly infectious symptoms, including no fever, no vomiting, no diarrhea, no rash. Patient has been evaluated by her physician during this illness, and has had CT, echocardiogram. Patient is also transitioning medication, with recent tapering of Zoloft. Patient is scheduled for outpatient follow-up in 2 days.   Past Medical History:  Diagnosis Date  . Anxiety   . Arthritis    RA  . Carpal tunnel syndrome, bilateral   . Chronic kidney disease    CKD stage III, absence of left kidney  . Congenital absence of one kidney    Pt has right kidney only  . Depression   . GERD (gastroesophageal reflux disease)   . Headache   . Heart murmur   . Hypercholesteremia   . Hypertension   . Peripheral vascular disease ([place])    a. s/p R external iliac stent '06 with angioplasty '13 (Dr. Benjamine Sprague) b. 01/2016: s/p PTA/stenting in L common iliac artery (Dr. Gwenlyn Found)  . Polymyalgia rheumatica ([place])   . Psoriasis     Patient Active Problem List   Diagnosis Date Noted  . Peripheral vascular disease ([place])   . Claudication ([place]) 01/31/2016  . Fatigue 10/18/2015  . Right knee pain 10/18/2015  . Acrochordon 08/03/2015  . Stasis dermatitis of both legs 01/18/2015  . Radiculopathy of lumbar region 01/06/2015  . Seborrheic keratosis 11/24/2014  . OSA (obstructive sleep apnea) 09/21/2014  . Osteoporosis 06/29/2014  . Thyroid nodule 06/29/2014  . Breast calcifications 06/29/2014  .  Glaucoma 01/08/2014  . Trigeminal neuralgia 12/18/2013  . Basal cell carcinoma 11/12/2013  . Presbyopia 10/22/2013  . Resting tremor 10/14/2013  . Hearing loss 10/13/2013  . Polymyalgia rheumatica ([place]) 09/11/2013  . GERD (gastroesophageal reflux disease) 09/11/2013  . Rash and nonspecific skin eruption 07/28/2013  . Pulmonary nodules 05/24/2012  . Carotid bruit 04/30/2012  . Hypertension 04/27/2012  . Anxiety and depression 04/27/2012  . Hyperlipemia 04/27/2012  . Cervical spondylosis with radiculopathy 04/19/2012    Past Surgical History:  Procedure Laterality Date  . ABDOMINAL HYSTERECTOMY  1963   Partial,  Due to bleeding after delivery  . ANTERIOR CERVICAL DECOMP/DISCECTOMY FUSION  04/19/2012   Procedure: ANTERIOR CERVICAL DECOMPRESSION/DISCECTOMY FUSION 2 LEVELS;  Surgeon: Winfield Cunas, MD;  Location: [facility];  Service: Neurosurgery;  Laterality: N/A;  Cervical four-five,Cervical five-six anterior cervical decompression with fusion plating and bonegraft possible posterior cervical decompression  . APPENDECTOMY    . CARPAL TUNNEL RELEASE  1990  . CERVICAL FUSION  04/19/2012  . EYE SURGERY    . ILIAC ARTERY STENT    . PERIPHERAL VASCULAR CATHETERIZATION N/A 01/31/2016   Procedure: Abdominal Aortogram;  Surgeon: [name], MD;  Location: [facility];  Service: Cardiovascular;  Laterality: N/A;  . PERIPHERAL VASCULAR CATHETERIZATION Bilateral 01/31/2016   Procedure: Lower Extremity Angiography;  Surgeon: [name], MD;  Location: [facility];  Service: Cardiovascular;  Laterality: Bilateral;  . PERIPHERAL VASCULAR  CATHETERIZATION Left 01/31/2016   Procedure: Peripheral Vascular Intervention;  Surgeon: [name], MD;  Location: [facility];  Service: Cardiovascular;  Laterality: Left;  ILIAC  . TONSILLECTOMY      OB History    Gravida Para Term Preterm AB Living   3 3       2    SAB TAB Ectopic Multiple Live Births                     Home Medications    Prior to Admission medications   Medication Sig Start Date End Date Taking? Authorizing Provider  acetaminophen (TYLENOL) 325 MG tablet Take 650 mg by mouth every 6 (six) hours as needed for mild pain.   Yes Historical Provider, MD  aspirin EC 81 MG EC tablet Take 1 tablet (81 mg total) by mouth daily. 02/01/16  Yes Eileen Stanford, PA-C  chlorthalidone (HYGROTON) 25 MG tablet Take 1 tablet (25 mg total) by mouth daily. 08/16/15  Yes Elberta Leatherwood, MD  Cholecalciferol (VITAMIN D) 2000 UNITS tablet Take 2,000 Units by mouth daily.   Yes Historical Provider, MD  clopidogrel (PLAVIX) 75 MG tablet TAKE ONE TABLET BY MOUTH IN THE MORNING 03/23/16  Yes Elberta Leatherwood, MD  dorzolamide (TRUSOPT) 2 % ophthalmic solution Place 2 drops into both eyes daily. 07/26/15  Yes Historical Provider, MD  fluticasone (FLONASE) 50 MCG/ACT nasal spray Place 1 spray into both nostrils daily as needed for allergies or rhinitis (congestion). 08/10/14  Yes Timmothy Euler, MD  latanoprost (XALATAN) 0.005 % ophthalmic solution Place 2 drops into both eyes daily. 07/26/15  Yes Historical Provider, MD  lisinopril (PRINIVIL,ZESTRIL) 10 MG tablet Take 1 tablet (10 mg total) by mouth daily. 01/10/16  Yes Elberta Leatherwood, MD  metoprolol tartrate (LOPRESSOR) 25 MG tablet Take 0.5 tablets (12.5 mg total) by mouth 2 (two) times daily. 01/10/16  Yes Elberta Leatherwood, MD  Multiple Vitamin (MULTIVITAMIN WITH MINERALS) TABS tablet Take 1 tablet by mouth daily.   Yes Historical Provider, MD  pantoprazole (PROTONIX) 20 MG tablet TAKE ONE TABLET BY MOUTH ONCE DAILY 07/28/16  Yes Elberta Leatherwood, MD  Polyethyl Glycol-Propyl Glycol (SYSTANE OP) Place 1 drop into both eyes 2 (two) times daily.   Yes Historical Provider, MD  sertraline (ZOLOFT) 100 MG tablet Take 100 mg by mouth daily.   Yes Historical Provider, MD  sodium chloride (MURO 128) 5 % ophthalmic ointment Place 1 application into both eyes at bedtime.    Yes Historical  Provider, MD  traMADol (ULTRAM) 50 MG tablet Take 2 tablets (100 mg total) by mouth 2 (two) times daily as needed for moderate pain. 05/04/16  Yes Elberta Leatherwood, MD    Family History Family History  Problem Relation Age of Onset  . Angina Mother   . Heart attack Father   . Melanoma Brother   . Cancer Sister     Lymphoma    Social History Social History  Substance Use Topics  . Smoking status: Former Smoker    Packs/day: 1.00    Years: 40.00    Types: Cigarettes    Quit date: 07/24/2010  . Smokeless tobacco: Never Used     Comment: STARTED BACK JULY 2013 AND RECENTLY QUIT 03-10-2012  . Alcohol use No     Comment: QUIT IN 1990      Allergies   Cyclobenzaprine and Statins   Review of Systems Review of Systems  Constitutional:  Per HPI, otherwise negative  HENT:       Per HPI, otherwise negative  Respiratory:       Per HPI, otherwise negative  Cardiovascular:       Per HPI, otherwise negative  Gastrointestinal: Negative for vomiting.  Endocrine:       Negative aside from HPI  Genitourinary:       Neg aside from HPI   Musculoskeletal:       Per HPI, otherwise negative  Skin: Negative.   Neurological: Positive for weakness. Negative for syncope.     Physical Exam Updated Vital Signs BP 152/85 (BP Location: Left Arm)   Pulse 68 Comment: Simultaneous filing. User may not have seen previous data.  Temp 98.3 F (36.8 C) (Oral)   Resp 19 Comment: Simultaneous filing. User may not have seen previous data.  SpO2 100% Comment: Simultaneous filing. User may not have seen previous data.  Physical Exam  Constitutional: She is oriented to person, place, and time. She appears well-developed and well-nourished. No distress.  HENT:  Head: Normocephalic and atraumatic.  Eyes: Conjunctivae and EOM are normal.  Cardiovascular: Normal rate and regular rhythm.   Pulmonary/Chest: Effort normal and breath sounds normal. No stridor. No respiratory distress.  Abdominal: She  exhibits no distension.  Musculoskeletal: She exhibits no edema.  Neurological: She is alert and oriented to person, place, and time. No cranial nerve deficit. She exhibits normal muscle tone. Coordination normal.  Skin: Skin is warm and dry.  Psychiatric: She has a normal mood and affect.  Nursing note and vitals reviewed.    ED Treatments / Results  Labs (all labs ordered are listed, but only abnormal results are displayed) Labs Reviewed  COMPREHENSIVE METABOLIC PANEL - Abnormal; Notable for the following:       Result Value   BUN 26 (*)    Creatinine, Ser 1.22 (*)    ALT 12 (*)    GFR calc non Af Amer 42 (*)    GFR calc Af Amer 48 (*)    All other components within normal limits  URINALYSIS, ROUTINE W REFLEX MICROSCOPIC - Abnormal; Notable for the following:    Color, Urine STRAW (*)    Specific Gravity, Urine 1.004 (*)    All other components within normal limits  CBC WITH DIFFERENTIAL/PLATELET - Abnormal; Notable for the following:    RBC 3.52 (*)    Hemoglobin 11.6 (*)    HCT 33.4 (*)    All other components within normal limits  ETHANOL  LIPASE, BLOOD  BRAIN NATRIURETIC PEPTIDE  TROPONIN I  CBC WITH DIFFERENTIAL/PLATELET   Radiology Dg Chest 2 View  Result Date: 08/26/2016 CLINICAL DATA:  Weakness EXAM: CHEST  2 VIEW COMPARISON:  03/29/2016 FINDINGS: Normal heart size and stable mild aortic tortuosity. Chronic mild interstitial coarsening in the lateral projection. There is no edema, consolidation, effusion, or pneumothorax. No acute or aggressive finding. IMPRESSION: No evidence of active disease. Electronically Signed   By: Monte Fantasia M.D.   On: 08/26/2016 11:45    Procedures Procedures (including critical care time)  Medications Ordered in ED Medications  acetaminophen (TYLENOL) tablet 650 mg (650 mg Oral Given 08/26/16 1342)     Initial Impression / Assessment and Plan / ED Course  I have reviewed the triage vital signs and the nursing  notes.  Pertinent labs & imaging results that were available during my care of the patient were reviewed by me and considered in my medical decision making (see chart for  details).  Chart review notable for echocardiogram performed yesterday with reassuring results. This was discussed with the patient and her daughter.  3:29 PM  Patient in no distress on repeat exam, all findings discussed length. With reassuring labs, x-ray, no distress, suspicion for changes in the patient's psychiatric medication is contributing to her overall state. No evidence for bacteremia, sepsis, infection.  Patient does have a primary care visit scheduled in 48 hours, and was stable for discharge with close follow-up.  Final Clinical Impressions(s) / ED Diagnoses   Final diagnoses:  Weakness     Carmin Muskrat, MD 08/26/16 [phone]

## 2016-08-28 ENCOUNTER — Ambulatory Visit (INDEPENDENT_AMBULATORY_CARE_PROVIDER_SITE_OTHER): Payer: PPO | Admitting: Family Medicine

## 2016-08-28 DIAGNOSIS — F418 Other specified anxiety disorders: Secondary | ICD-10-CM

## 2016-08-28 DIAGNOSIS — F32A Depression, unspecified: Secondary | ICD-10-CM

## 2016-08-28 DIAGNOSIS — F419 Anxiety disorder, unspecified: Principal | ICD-10-CM

## 2016-08-28 DIAGNOSIS — F329 Major depressive disorder, single episode, unspecified: Secondary | ICD-10-CM

## 2016-08-28 MED ORDER — CITALOPRAM HYDROBROMIDE 20 MG PO TABS: 20.0000 mg | ORAL_TABLET | Freq: Every day | ORAL | 3 refills | Status: DC

## 2016-08-28 NOTE — Patient Instructions (Signed)
It was a pleasure seeing you today in our clinic. Today we discussed your medications. Here is the treatment plan we have discussed and agreed upon together:   - I have placed you on a new medication called Celexa. Take this one time a day. This medication takes a few weeks to build up in her system. Please be patient with this. - As we discussed, you should be (and you have been) completely off of Zoloft. - I would like to see you back in 4-5 weeks to discuss how you've responded to the Celexa. - You may also come back to see me sooner if you have any urgent questions.

## 2016-08-28 NOTE — Assessment & Plan Note (Signed)
Worsening: Patient's depression has been worsening since weaning off of Zoloft. Denies SI/HI. She is a year to start a new medication. - Discontinued Zoloft (removed from medication list) - Initiate Celexa >> if in 2 weeks patient has had NO improvement she is to contact our office and at that time I will likely increase this to the maximum dose, 40 mg. - Follow-up in 4-5 weeks.  Next: Patient has no improvement with Celexa and would strongly consider transition towards SNRI (Cymbalta) due to additional pain relief benefits.

## 2016-08-28 NOTE — Progress Notes (Signed)
   HPI  CC: Depression Patient is here for follow-up on her depression. At the last visit we had decided to transition SSRIs from her previous Zoloft. Patient states that she tapered off this medication as instructed and has been off this medication since last week. Patient endorses worsening depression with some anxiety since she began weaning off this medication. She is eager to get restarted on a new medication. She states that her depression is relatively constant. She denies any SI/HI. Most of her depression comes from the stress and emotions associated with her family. She occasionally has difficulty enjoying things however this is typically not an issue. She has persistent fatigue and getting out of bed can be difficult. Her appetite is also occasionally affected. She denies any headache, shortness of breath, chest pain, dysphagia, nausea, vomiting, diarrhea, dysuria, fevers, or chills.  Review of Systems    See HPI for ROS. All other systems reviewed and are negative.  CC, SH/smoking status, and VS noted  Objective: BP 125/70 (BP Location: Right Arm, Patient Position: Sitting, Cuff Size: Normal)   Pulse 70   Temp 98.2 F (36.8 C) (Oral)   Wt 148 lb (67.1 kg)   BMI 29.89 kg/m  Gen: NAD, alert, cooperative, and pleasant. HEENT: NCAT, EOMI, PERRL CV: RRR, no murmur Resp: CTAB, no wheezes, non-labored   Assessment and plan:  Anxiety and depression Worsening: Patient's depression has been worsening since weaning off of Zoloft. Denies SI/HI. She is a year to start a new medication. - Discontinued Zoloft (removed from medication list) - Initiate Celexa >> if in 2 weeks patient has had NO improvement she is to contact our office and at that time I will likely increase this to the maximum dose, 40 mg. - Follow-up in 4-5 weeks.  Next: Patient has no improvement with Celexa and would strongly consider transition towards SNRI (Cymbalta) due to additional pain relief benefits.  Also:  Patient asking for results from previous sleep study. These are printed out and handed to her.  Meds ordered this encounter  Medications  . citalopram (CELEXA) 20 MG tablet    Sig: Take 1 tablet (20 mg total) by mouth daily.    Dispense:  30 tablet    Refill:  3     Elberta Leatherwood, MD,MS,  PGY3 08/28/2016 2:51 PM

## 2016-09-05 ENCOUNTER — Other Ambulatory Visit: Payer: Self-pay | Admitting: Family Medicine

## 2016-09-06 ENCOUNTER — Ambulatory Visit: Payer: PPO

## 2016-09-11 ENCOUNTER — Other Ambulatory Visit: Payer: Self-pay | Admitting: Cardiovascular Disease

## 2016-09-11 DIAGNOSIS — I739 Peripheral vascular disease, unspecified: Secondary | ICD-10-CM

## 2016-09-18 ENCOUNTER — Telehealth: Payer: Self-pay | Admitting: *Deleted

## 2016-09-18 NOTE — Telephone Encounter (Signed)
Patient called in stating she "can't breathe, like not getting enough air in." Has been unable to sleep due to feeling like she can't breathe. States this has been going on for 1 month. Started new medication 3 weeks ago (celexa). C/o 1 week hx of feeling whole body is "on fire, like I've rolled in fiberglass." Notes itching all over. "Feels like I have hairs in mouth and throat." Denies throat closure. Also c/o "slimy, jelly-like green diarrhea since yesterday. Patient lives near [facility]. Instructed to have someone drive her directly there for evaluation. Patient states she will. Also requesting call from Dr. Alease Frame. Hubbard Hartshorn, RN, BSN

## 2016-09-19 ENCOUNTER — Inpatient Hospital Stay (HOSPITAL_COMMUNITY): Admission: RE | Admit: 2016-09-19 | Payer: PPO | Source: Ambulatory Visit

## 2016-09-19 ENCOUNTER — Encounter (HOSPITAL_COMMUNITY): Payer: PPO

## 2016-09-20 ENCOUNTER — Encounter (HOSPITAL_COMMUNITY): Payer: Self-pay | Admitting: *Deleted

## 2016-09-20 ENCOUNTER — Emergency Department (HOSPITAL_COMMUNITY)
Admission: EM | Admit: 2016-09-20 | Discharge: 2016-09-21 | Disposition: A | Discharge status: Other Acute Inpatient Hospital | Payer: PPO | Attending: Emergency Medicine | Admitting: Emergency Medicine

## 2016-09-20 DIAGNOSIS — F332 Major depressive disorder, recurrent severe without psychotic features: Secondary | ICD-10-CM | POA: Diagnosis not present

## 2016-09-20 DIAGNOSIS — N183 Chronic kidney disease, stage 3 (moderate): Secondary | ICD-10-CM | POA: Insufficient documentation

## 2016-09-20 DIAGNOSIS — Z87891 Personal history of nicotine dependence: Secondary | ICD-10-CM | POA: Insufficient documentation

## 2016-09-20 DIAGNOSIS — F99 Mental disorder, not otherwise specified: Secondary | ICD-10-CM | POA: Diagnosis not present

## 2016-09-20 DIAGNOSIS — Z79899 Other long term (current) drug therapy: Secondary | ICD-10-CM | POA: Insufficient documentation

## 2016-09-20 DIAGNOSIS — I739 Peripheral vascular disease, unspecified: Secondary | ICD-10-CM | POA: Diagnosis not present

## 2016-09-20 DIAGNOSIS — Z888 Allergy status to other drugs, medicaments and biological substances status: Secondary | ICD-10-CM | POA: Diagnosis not present

## 2016-09-20 DIAGNOSIS — R45851 Suicidal ideations: Secondary | ICD-10-CM

## 2016-09-20 DIAGNOSIS — Z7982 Long term (current) use of aspirin: Secondary | ICD-10-CM | POA: Diagnosis not present

## 2016-09-20 DIAGNOSIS — I129 Hypertensive chronic kidney disease with stage 1 through stage 4 chronic kidney disease, or unspecified chronic kidney disease: Secondary | ICD-10-CM | POA: Diagnosis not present

## 2016-09-20 DIAGNOSIS — R51 Headache: Secondary | ICD-10-CM | POA: Diagnosis not present

## 2016-09-20 LAB — COMPREHENSIVE METABOLIC PANEL
ALT: 9 U/L — ABNORMAL LOW (ref 14–54)
AST: 24 U/L (ref 15–41)
Albumin: 4.7 g/dL (ref 3.5–5.0)
Alkaline Phosphatase: 54 U/L (ref 38–126)
Anion gap: 6 (ref 5–15)
BUN: 23 mg/dL — ABNORMAL HIGH (ref 6–20)
CO2: 26 mmol/L (ref 22–32)
Calcium: 9.7 mg/dL (ref 8.9–10.3)
Chloride: 107 mmol/L (ref 101–111)
Creatinine, Ser: 1.2 mg/dL — ABNORMAL HIGH (ref 0.44–1.00)
GFR calc Af Amer: 49 mL/min — ABNORMAL LOW (ref >60)
GFR calc non Af Amer: 42 mL/min — ABNORMAL LOW (ref >60)
Glucose, Bld: 88 mg/dL (ref 65–99)
Potassium: 4.7 mmol/L (ref 3.5–5.1)
Sodium: 139 mmol/L (ref 135–145)
Total Bilirubin: 0.6 mg/dL (ref 0.3–1.2)
Total Protein: 7.4 g/dL (ref 6.5–8.1)

## 2016-09-20 LAB — CBC WITH DIFFERENTIAL/PLATELET
Basophils Absolute: 0 10*3/uL (ref 0.0–0.1)
Basophils Relative: 0 %
Eosinophils Absolute: 0.2 10*3/uL (ref 0.0–0.7)
Eosinophils Relative: 3 %
HCT: 34.5 % — ABNORMAL LOW (ref 36.0–46.0)
Hemoglobin: 11.7 g/dL — ABNORMAL LOW (ref 12.0–15.0)
Lymphocytes Relative: 30 %
Lymphs Abs: 1.7 10*3/uL (ref 0.7–4.0)
MCH: 32.5 pg (ref 26.0–34.0)
MCHC: 33.9 g/dL (ref 30.0–36.0)
MCV: 95.8 fL (ref 78.0–100.0)
Monocytes Absolute: 0.5 10*3/uL (ref 0.1–1.0)
Monocytes Relative: 9 %
Neutro Abs: 3.3 10*3/uL (ref 1.7–7.7)
Neutrophils Relative %: 58 %
Platelets: 185 10*3/uL (ref 150–400)
RBC: 3.6 MIL/uL — ABNORMAL LOW (ref 3.87–5.11)
RDW: 13.4 % (ref 11.5–15.5)
WBC: 5.6 10*3/uL (ref 4.0–10.5)

## 2016-09-20 LAB — RAPID URINE DRUG SCREEN, HOSP PERFORMED
Amphetamines: NOT DETECTED
Barbiturates: NOT DETECTED
Benzodiazepines: NOT DETECTED
Cocaine: NOT DETECTED
Opiates: NOT DETECTED
Tetrahydrocannabinol: NOT DETECTED

## 2016-09-20 LAB — URINALYSIS, ROUTINE W REFLEX MICROSCOPIC
Bilirubin Urine: NEGATIVE
Glucose, UA: NEGATIVE mg/dL
Hgb urine dipstick: NEGATIVE
Ketones, ur: NEGATIVE mg/dL
Leukocytes, UA: NEGATIVE
Nitrite: NEGATIVE
Protein, ur: NEGATIVE mg/dL
Specific Gravity, Urine: 1.006 (ref 1.005–1.030)
pH: 5 (ref 5.0–8.0)

## 2016-09-20 LAB — SALICYLATE LEVEL: Salicylate Lvl: 13.7 mg/dL (ref 2.8–30.0)

## 2016-09-20 LAB — ACETAMINOPHEN LEVEL: Acetaminophen (Tylenol), Serum: <10 ug/mL — ABNORMAL LOW (ref 10–30)

## 2016-09-20 LAB — ETHANOL: Alcohol, Ethyl (B): <5 mg/dL (ref <5)

## 2016-09-20 MED ORDER — ASPIRIN EC 81 MG PO TBEC
81.0000 mg | DELAYED_RELEASE_TABLET | Freq: Every day | ORAL | Status: DC
  Administered 2016-09-20 – 2016-09-21 (×2): 81 mg via ORAL
  Filled 2016-09-20 (×2): qty 1

## 2016-09-20 MED ORDER — ACETAMINOPHEN 325 MG PO TABS
650.0000 mg | ORAL_TABLET | Freq: Four times a day (QID) | ORAL | Status: DC | PRN
  Administered 2016-09-20: 650 mg via ORAL
  Filled 2016-09-20: qty 2

## 2016-09-20 MED ORDER — PANTOPRAZOLE SODIUM 20 MG PO TBEC
20.0000 mg | DELAYED_RELEASE_TABLET | Freq: Every day | ORAL | Status: DC
  Administered 2016-09-21: 20 mg via ORAL
  Filled 2016-09-20: qty 1

## 2016-09-20 MED ORDER — DIPHENHYDRAMINE HCL 25 MG PO CAPS
25.0000 mg | ORAL_CAPSULE | Freq: Four times a day (QID) | ORAL | Status: DC | PRN
  Administered 2016-09-20: 25 mg via ORAL
  Filled 2016-09-20: qty 1

## 2016-09-20 MED ORDER — CITALOPRAM HYDROBROMIDE 10 MG PO TABS
20.0000 mg | ORAL_TABLET | Freq: Every day | ORAL | Status: DC
  Administered 2016-09-20: 20 mg via ORAL
  Filled 2016-09-20 (×2): qty 2

## 2016-09-20 MED ORDER — LATANOPROST 0.005 % OP SOLN
2.0000 [drp] | Freq: Every day | OPHTHALMIC | Status: DC
  Administered 2016-09-20: 2 [drp] via OPHTHALMIC
  Filled 2016-09-20: qty 2.5

## 2016-09-20 MED ORDER — CLOPIDOGREL BISULFATE 75 MG PO TABS
75.0000 mg | ORAL_TABLET | Freq: Every morning | ORAL | Status: DC
  Administered 2016-09-21: 75 mg via ORAL
  Filled 2016-09-20: qty 1

## 2016-09-20 MED ORDER — LISINOPRIL 10 MG PO TABS
10.0000 mg | ORAL_TABLET | Freq: Every day | ORAL | Status: DC
  Administered 2016-09-21: 10 mg via ORAL
  Filled 2016-09-20: qty 1

## 2016-09-20 MED ORDER — ADULT MULTIVITAMIN W/MINERALS CH
1.0000 | ORAL_TABLET | Freq: Every day | ORAL | Status: DC
  Administered 2016-09-20 – 2016-09-21 (×2): 1 via ORAL
  Filled 2016-09-20 (×2): qty 1

## 2016-09-20 MED ORDER — DORZOLAMIDE HCL 2 % OP SOLN
2.0000 [drp] | Freq: Every day | OPHTHALMIC | Status: DC
  Administered 2016-09-21: 2 [drp] via OPHTHALMIC
  Filled 2016-09-20: qty 10

## 2016-09-20 MED ORDER — METOPROLOL TARTRATE 25 MG PO TABS
12.5000 mg | ORAL_TABLET | Freq: Two times a day (BID) | ORAL | Status: DC
Start: 2016-09-20 — End: 2016-09-21
  Administered 2016-09-20 – 2016-09-21 (×2): 12.5 mg via ORAL
  Filled 2016-09-20 (×3): qty 1

## 2016-09-20 MED ORDER — FLUTICASONE PROPIONATE 50 MCG/ACT NA SUSP
1.0000 | Freq: Every day | NASAL | Status: DC | PRN
  Filled 2016-09-20: qty 16
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
## 2016-09-20 NOTE — ED Notes (Signed)
Delay on EKG TTS and Phlebotomy both in the room

## 2016-09-20 NOTE — ED Provider Notes (Signed)
[place] DEPT Provider Note   CSN: [id number] Arrival date & time: 09/20/16  1238     History   Chief Complaint Chief Complaint  Patient presents with  . Suicidal  . Headache    HPI [name] [name] is a 76 y.o. female.  HPI Patient reports that she cannot be in her home anymore where she lives with her daughter. She feels like there is something in the air that is getting into her nose mouth and lungs and makes it feel like she has bits of fiberglass all over her. She reports she feels like she has little hairs in her nose and mouth that she has to try to clear or spit out. She reports it makes her feel like she is going crazy. The patient's daughter reports that no one else living in the house suffers from similar symptoms. The patient reports that the symptoms go away when she gets out of the house. At times she also feels that she's hearing or ringing or music in her head and has heard voices. She cannot quantify what they were saying. She states she does not trust her emotions and feelings anymore. She reports she has been thinking about hurting or killing herself. The patient became very tearful and reports that she drove her car to the train tracks and sat there thinking about sitting on the train tracks to make in all end. Her daughter reports that many of these somatic symptoms have been ongoing now for months to years. She reports she has had headaches that have been evaluated and she has had scans on her chest that showed one stable nodule but no other acute problems.  Past Medical History:  Diagnosis Date  . Anxiety   . Arthritis    RA  . Carpal tunnel syndrome, bilateral   . Chronic kidney disease    CKD stage III, absence of left kidney  . Congenital absence of one kidney    Pt has right kidney only  . Depression   . GERD (gastroesophageal reflux disease)   . Headache   . Heart murmur   . Hypercholesteremia   . Hypertension   . Peripheral vascular disease ([place])      a. s/p R external iliac stent '06 with angioplasty '13 (Dr. Benjamine Sprague) b. 01/2016: s/p PTA/stenting in L common iliac artery (Dr. Gwenlyn Found)  . Polymyalgia rheumatica ([place])   . Psoriasis     Patient Active Problem List   Diagnosis Date Noted  . Peripheral vascular disease ([place])   . Claudication ([place]) 01/31/2016  . Fatigue 10/18/2015  . Right knee pain 10/18/2015  . Acrochordon 08/03/2015  . Stasis dermatitis of both legs 01/18/2015  . Radiculopathy of lumbar region 01/06/2015  . Seborrheic keratosis 11/24/2014  . OSA (obstructive sleep apnea) 09/21/2014  . Osteoporosis 06/29/2014  . Thyroid nodule 06/29/2014  . Breast calcifications 06/29/2014  . Glaucoma 01/08/2014  . Trigeminal neuralgia 12/18/2013  . Basal cell carcinoma 11/12/2013  . Presbyopia 10/22/2013  . Resting tremor 10/14/2013  . Hearing loss 10/13/2013  . Polymyalgia rheumatica ([place]) 09/11/2013  . GERD (gastroesophageal reflux disease) 09/11/2013  . Rash and nonspecific skin eruption 07/28/2013  . Pulmonary nodules 05/24/2012  . Carotid bruit 04/30/2012  . Hypertension 04/27/2012  . Anxiety and depression 04/27/2012  . Hyperlipemia 04/27/2012  . Cervical spondylosis with radiculopathy 04/19/2012    Past Surgical History:  Procedure Laterality Date  . ABDOMINAL HYSTERECTOMY  1963   Partial,  Due to bleeding after delivery  .  ANTERIOR CERVICAL DECOMP/DISCECTOMY FUSION  04/19/2012   Procedure: ANTERIOR CERVICAL DECOMPRESSION/DISCECTOMY FUSION 2 LEVELS;  Surgeon: Winfield Cunas, MD;  Location: [place] [facility];  Service: Neurosurgery;  Laterality: N/A;  Cervical four-five,Cervical five-six anterior cervical decompression with fusion plating and bonegraft possible posterior cervical decompression  . APPENDECTOMY    . CARPAL TUNNEL RELEASE  1990  . CERVICAL FUSION  04/19/2012  . EYE SURGERY    . ILIAC ARTERY STENT    . PERIPHERAL VASCULAR CATHETERIZATION N/A 01/31/2016   Procedure: Abdominal Aortogram;  Surgeon: [name], MD;  Location: [place] CV LAB;  Service: Cardiovascular;  Laterality: N/A;  . PERIPHERAL VASCULAR CATHETERIZATION Bilateral 01/31/2016   Procedure: Lower Extremity Angiography;  Surgeon: [name], MD;  Location: [place] [facility] LAB;  Service: Cardiovascular;  Laterality: Bilateral;  . PERIPHERAL VASCULAR CATHETERIZATION Left 01/31/2016   Procedure: Peripheral Vascular Intervention;  Surgeon: [name], MD;  Location: [place] CV LAB;  Service: Cardiovascular;  Laterality: Left;  ILIAC  . TONSILLECTOMY      OB History    Gravida Para Term Preterm AB Living   3 3       2    SAB TAB Ectopic Multiple Live Births                   Home Medications    Prior to Admission medications   Medication Sig Start Date End Date Taking? Authorizing Provider  acetaminophen (TYLENOL) 325 MG tablet Take 650 mg by mouth every 6 (six) hours as needed for mild pain.    Historical Provider, MD  aspirin EC 81 MG EC tablet Take 1 tablet (81 mg total) by mouth daily. 02/01/16   Eileen Stanford, PA-C  chlorthalidone (HYGROTON) 25 MG tablet TAKE ONE TABLET BY MOUTH ONCE DAILY 09/05/16   Elberta Leatherwood, MD  Cholecalciferol (VITAMIN D) 2000 UNITS tablet Take 2,000 Units by mouth daily.    Historical Provider, MD  citalopram (CELEXA) 20 MG tablet Take 1 tablet (20 mg total) by mouth daily. 08/28/16   Elberta Leatherwood, MD  clopidogrel (PLAVIX) 75 MG tablet TAKE ONE TABLET BY MOUTH IN THE MORNING 03/23/16   Elberta Leatherwood, MD  dorzolamide (TRUSOPT) 2 % ophthalmic solution Place 2 drops into both eyes daily. 07/26/15   Historical Provider, MD  fluticasone (FLONASE) 50 MCG/ACT nasal spray Place 1 spray into both nostrils daily as needed for allergies or rhinitis (congestion). 08/10/14   Timmothy Euler, MD  latanoprost (XALATAN) 0.005 % ophthalmic solution Place 2 drops into both eyes daily. 07/26/15   Historical Provider, MD  lisinopril (PRINIVIL,ZESTRIL) 10 MG tablet Take 1 tablet (10 mg total) by mouth  daily. 01/10/16   Elberta Leatherwood, MD  metoprolol tartrate (LOPRESSOR) 25 MG tablet Take 0.5 tablets (12.5 mg total) by mouth 2 (two) times daily. 01/10/16   Elberta Leatherwood, MD  Multiple Vitamin (MULTIVITAMIN WITH MINERALS) TABS tablet Take 1 tablet by mouth daily.    Historical Provider, MD  pantoprazole (PROTONIX) 20 MG tablet TAKE ONE TABLET BY MOUTH ONCE DAILY 07/28/16   Elberta Leatherwood, MD  Polyethyl Glycol-Propyl Glycol (SYSTANE OP) Place 1 drop into both eyes 2 (two) times daily.    Historical Provider, MD  sodium chloride (MURO 128) 5 % ophthalmic ointment Place 1 application into both eyes at bedtime.     Historical Provider, MD  traMADol (ULTRAM) 50 MG tablet Take 2 tablets (100 mg total) by mouth 2 (two)  times daily as needed for moderate pain. 05/04/16   Elberta Leatherwood, MD    Family History Family History  Problem Relation Age of Onset  . Angina Mother   . Heart attack Father   . Melanoma Brother   . Cancer Sister     Lymphoma    Social History Social History  Substance Use Topics  . Smoking status: Former Smoker    Packs/day: 1.00    Years: 40.00    Types: Cigarettes    Quit date: 07/24/2010  . Smokeless tobacco: Never Used     Comment: STARTED BACK JULY 2013 AND RECENTLY QUIT 03-10-2012  . Alcohol use No     Comment: QUIT IN 1990      Allergies   Cyclobenzaprine and Statins   Review of Systems Review of Systems 10 Systems reviewed and are negative for acute change except as noted in the HPI.   Physical Exam Updated Vital Signs BP 140/69 (BP Location: Right Arm)   Pulse 77   Temp 98.9 F (37.2 C)   Resp 16   Physical Exam  Constitutional: She is oriented to person, place, and time. She appears well-developed and well-nourished. No distress.  Patient appears anxious as I enter the room. She is sitting on the stretcher with her knees pulled up to her chest and her arms around her knees. She is alert she has no respiratory distress.  HENT:  Head: Normocephalic and  atraumatic.  Nose: Nose normal.  Mouth/Throat: Oropharynx is clear and moist.  Eyes: Conjunctivae and EOM are normal. Pupils are equal, round, and reactive to light.  Neck: Neck supple.  Cardiovascular: Normal rate and regular rhythm.   No murmur heard. Pulmonary/Chest: Effort normal and breath sounds normal. No respiratory distress.  Abdominal: Soft. There is no tenderness. There is no guarding.  Musculoskeletal: Normal range of motion. She exhibits no edema, tenderness or deformity.  Neurological: She is alert and oriented to person, place, and time. No cranial nerve deficit. She exhibits normal muscle tone. Coordination normal.  Skin: Skin is warm and dry.  Psychiatric:  Patient has anxious appearance. She is cooperative and has fair eye contact. At points during the interview she becomes actively tearful. She is not responding to external stimuli.  Nursing note and vitals reviewed.    ED Treatments / Results  Labs (all labs ordered are listed, but only abnormal results are displayed) Labs Reviewed  COMPREHENSIVE METABOLIC PANEL  ETHANOL  CBC WITH DIFFERENTIAL/PLATELET  RAPID URINE DRUG SCREEN, HOSP PERFORMED  ACETAMINOPHEN LEVEL  SALICYLATE LEVEL  URINALYSIS, ROUTINE W REFLEX MICROSCOPIC    EKG  EKG Interpretation None       Radiology No results found.  Procedures Procedures (including critical care time)  Medications Ordered in ED Medications  acetaminophen (TYLENOL) tablet 650 mg (not administered)  aspirin EC tablet 81 mg (not administered)  citalopram (CELEXA) tablet 20 mg (not administered)  clopidogrel (PLAVIX) tablet 75 mg (not administered)  dorzolamide (TRUSOPT) 2 % ophthalmic solution 2 drop (not administered)  fluticasone (FLONASE) 50 MCG/ACT nasal spray 1 spray (not administered)  latanoprost (XALATAN) 0.005 % ophthalmic solution 2 drop (not administered)  lisinopril (PRINIVIL,ZESTRIL) tablet 10 mg (not administered)  metoprolol tartrate  (LOPRESSOR) tablet 12.5 mg (not administered)  multivitamin with minerals tablet 1 tablet (not administered)  pantoprazole (PROTONIX) EC tablet 20 mg (not administered)     Initial Impression / Assessment and Plan / ED Course  I have reviewed the triage vital signs and the nursing  notes.  Pertinent labs & imaging results that were available during my care of the patient were reviewed by me and considered in my medical decision making (see chart for details).      Final Clinical Impressions(s) / ED Diagnoses   Final diagnoses:  Suicidal ideation  Severe episode of recurrent major depressive disorder, without psychotic features ([place])  Patient presents with severe depression and anxiety. This has been ongoing and worsening. The patient became very tearful and confided in me that she actually drove her car to railroad tracks and sat there considering getting on the tracks awaiting for the train to hit her. Patient's daughter advises she has been having multiple somatic complaints for months to years without positive diagnostic findings. At this time, patient is alert and appropriate. Her neurologic examination is normal. I do not find evidence of infectious illness. Patient is medically cleared for psychiatric evaluation and admission.  New Prescriptions New Prescriptions   No medications on file     [name], MD 09/20/16 1556

## 2016-09-20 NOTE — BH Assessment (Signed)
Pt has been referred to the following inpt facilities for possible admission: [place]; [facility]; [place]; [place]; [place]; [place], LCSW-A TTS Specialist

## 2016-09-20 NOTE — ED Notes (Signed)
Pt reports itching all over, especially on upper extremities. Dr. Alberteen Sam notified, benadryl ordered.

## 2016-09-20 NOTE — BHH Counselor (Signed)
TTS consult ordered at 1357. No EDP note entered yet. TTS will do assessment after pt seen by EDP and deemed medically clear.  Arnold Long, [place] Therapeutic Triage Specialist

## 2016-09-20 NOTE — ED Triage Notes (Signed)
Per EMS, pt states she feels like she has fiberglass in her mouth, is itching all over and complains of 10/10 headache. EMS states she was recently placed on citalopram and daughter wants pt to have a psych evaluation. Pt is voluntary.

## 2016-09-20 NOTE — ED Notes (Signed)
Bed: WA29 Expected date:  Expected time:  Means of arrival:  Comments: rm 17

## 2016-09-20 NOTE — ED Notes (Signed)
Bed: WA17 Expected date:  Expected time:  Means of arrival:  Comments: 76 yo f headache, psych?

## 2016-09-21 ENCOUNTER — Encounter (HOSPITAL_COMMUNITY): Payer: PPO

## 2016-09-21 DIAGNOSIS — G8929 Other chronic pain: Secondary | ICD-10-CM | POA: Diagnosis not present

## 2016-09-21 DIAGNOSIS — R339 Retention of urine, unspecified: Secondary | ICD-10-CM | POA: Diagnosis not present

## 2016-09-21 DIAGNOSIS — K219 Gastro-esophageal reflux disease without esophagitis: Secondary | ICD-10-CM | POA: Diagnosis not present

## 2016-09-21 DIAGNOSIS — Z981 Arthrodesis status: Secondary | ICD-10-CM | POA: Diagnosis not present

## 2016-09-21 DIAGNOSIS — Z905 Acquired absence of kidney: Secondary | ICD-10-CM | POA: Diagnosis not present

## 2016-09-21 DIAGNOSIS — R45851 Suicidal ideations: Secondary | ICD-10-CM | POA: Diagnosis not present

## 2016-09-21 DIAGNOSIS — F332 Major depressive disorder, recurrent severe without psychotic features: Secondary | ICD-10-CM | POA: Diagnosis not present

## 2016-09-21 DIAGNOSIS — Z888 Allergy status to other drugs, medicaments and biological substances status: Secondary | ICD-10-CM

## 2016-09-21 DIAGNOSIS — F068 Other specified mental disorders due to known physiological condition: Secondary | ICD-10-CM | POA: Diagnosis not present

## 2016-09-21 DIAGNOSIS — F323 Major depressive disorder, single episode, severe with psychotic features: Secondary | ICD-10-CM | POA: Diagnosis not present

## 2016-09-21 DIAGNOSIS — H409 Unspecified glaucoma: Secondary | ICD-10-CM | POA: Diagnosis not present

## 2016-09-21 DIAGNOSIS — Z79899 Other long term (current) drug therapy: Secondary | ICD-10-CM

## 2016-09-21 DIAGNOSIS — Z7982 Long term (current) use of aspirin: Secondary | ICD-10-CM

## 2016-09-21 DIAGNOSIS — Z7902 Long term (current) use of antithrombotics/antiplatelets: Secondary | ICD-10-CM | POA: Diagnosis not present

## 2016-09-21 DIAGNOSIS — I739 Peripheral vascular disease, unspecified: Secondary | ICD-10-CM

## 2016-09-21 DIAGNOSIS — N390 Urinary tract infection, site not specified: Secondary | ICD-10-CM | POA: Diagnosis not present

## 2016-09-21 DIAGNOSIS — R4182 Altered mental status, unspecified: Secondary | ICD-10-CM | POA: Diagnosis not present

## 2016-09-21 DIAGNOSIS — D631 Anemia in chronic kidney disease: Secondary | ICD-10-CM | POA: Diagnosis not present

## 2016-09-21 DIAGNOSIS — G4733 Obstructive sleep apnea (adult) (pediatric): Secondary | ICD-10-CM | POA: Diagnosis not present

## 2016-09-21 DIAGNOSIS — Z87891 Personal history of nicotine dependence: Secondary | ICD-10-CM | POA: Diagnosis not present

## 2016-09-21 DIAGNOSIS — F29 Unspecified psychosis not due to a substance or known physiological condition: Secondary | ICD-10-CM | POA: Diagnosis not present

## 2016-09-21 DIAGNOSIS — N179 Acute kidney failure, unspecified: Secondary | ICD-10-CM | POA: Diagnosis not present

## 2016-09-21 DIAGNOSIS — N183 Chronic kidney disease, stage 3 (moderate): Secondary | ICD-10-CM | POA: Diagnosis not present

## 2016-09-21 DIAGNOSIS — E782 Mixed hyperlipidemia: Secondary | ICD-10-CM | POA: Diagnosis not present

## 2016-09-21 DIAGNOSIS — R040 Epistaxis: Secondary | ICD-10-CM | POA: Diagnosis not present

## 2016-09-21 DIAGNOSIS — M549 Dorsalgia, unspecified: Secondary | ICD-10-CM | POA: Diagnosis not present

## 2016-09-21 DIAGNOSIS — J309 Allergic rhinitis, unspecified: Secondary | ICD-10-CM | POA: Diagnosis not present

## 2016-09-21 DIAGNOSIS — I129 Hypertensive chronic kidney disease with stage 1 through stage 4 chronic kidney disease, or unspecified chronic kidney disease: Secondary | ICD-10-CM | POA: Diagnosis not present

## 2016-09-21 MED ORDER — HYDROXYZINE HCL 25 MG PO TABS
50.0000 mg | ORAL_TABLET | Freq: Four times a day (QID) | ORAL | Status: DC | PRN
Start: 2016-09-21 — End: 2016-09-21

## 2016-09-21 MED ORDER — HYDROXYZINE HCL 10 MG PO TABS
10.0000 mg | ORAL_TABLET | Freq: Three times a day (TID) | ORAL | Status: DC
  Administered 2016-09-21: 10 mg via ORAL
  Filled 2016-09-21 (×2): qty 1

## 2016-09-21 NOTE — Progress Notes (Signed)
Patient signed voluntary consent forms for transfer to [facility]. CSW faxed information to number provided. Patient requested CSW contact daughter, [name] [phone],  regarding placement information and address of facility. CSW provided information. Per daughter, patient does not know daughters phone number and requested CSW provide patient with phone number. CSW will write down daughter number for patient.   Kingsley Spittle, LCSWA Clinical Social Worker [phone]

## 2016-09-21 NOTE — ED Notes (Signed)
Report called to Lauree Chandler RN at [facility].  [facility] called for transport.

## 2016-09-21 NOTE — ED Notes (Signed)
Patient also c/o dry itchy skin for about a week.  Also c/o feeling like there is hair in her mouth.

## 2016-09-21 NOTE — BH Assessment (Signed)
[facility] Assessment Progress Note   Clinician received a call from [place] at [facility].  She said that patient had been accepted to Dr. Alonna Minium.  Nurse call report to [phone].  She asked about a fax number for voluntary paperwork to be sent to.  Clinician gave her the [phone] number to speak to patient's nurse and get the fax number.

## 2016-09-21 NOTE — ED Notes (Addendum)
Pt stated " I haven's slept for weeks except for little dabs here & there.  I used to go to see Hoyle Sauer for depression but she left.  I've had 5 doctors with [facility] that has left the system.  I've had so many family members pass.  I just lost my 2nd son-in-law about a month ago."

## 2016-09-21 NOTE — ED Notes (Signed)
Received call from Aretha Parrot, requesting fax # to send voluntary commitment form to be signed by pt & be witnessed by 2 RN's.  # provided from fax machine, [phone].

## 2016-09-21 NOTE — ED Notes (Signed)
Dr. Johnney Killian notified of patient's loose, frequent stools.

## 2016-09-21 NOTE — Consult Note (Signed)
[facility] Face-to-Face Psychiatry Consult   Reason for Consult:  Depression with suicidal ideations Referring Physician:  EDP Patient Identification: [name] [name] MRN:  [id number] Principal Diagnosis: Major depressive disorder, recurrent severe without psychotic features [facility]) Diagnosis:   Patient Active Problem List   Diagnosis Date Noted  . Major depressive disorder, recurrent severe without psychotic features ([place]) [F33.2] 09/21/2016    Priority: High  . Peripheral vascular disease ([place]) [I73.9]   . Claudication ([place]) [I73.9] 01/31/2016  . Fatigue [R53.83] 10/18/2015  . Right knee pain [M25.561] 10/18/2015  . Acrochordon [L91.8] 08/03/2015  . Stasis dermatitis of both legs [I87.2] 01/18/2015  . Radiculopathy of lumbar region [M54.16] 01/06/2015  . Seborrheic keratosis [L82.1] 11/24/2014  . OSA (obstructive sleep apnea) [G47.33] 09/21/2014  . Osteoporosis [M81.0] 06/29/2014  . Thyroid nodule [E04.1] 06/29/2014  . Breast calcifications [R92.1] 06/29/2014  . Glaucoma [H40.9] 01/08/2014  . Trigeminal neuralgia [G50.0] 12/18/2013  . Basal cell carcinoma [C44.91] 11/12/2013  . Presbyopia [H52.4] 10/22/2013  . Resting tremor [R25.9] 10/14/2013  . Hearing loss [H91.90] 10/13/2013  . Polymyalgia rheumatica ([place]) [M35.3] 09/11/2013  . GERD (gastroesophageal reflux disease) [K21.9] 09/11/2013  . Rash and nonspecific skin eruption [R21] 07/28/2013  . Pulmonary nodules [R91.8] 05/24/2012  . Carotid bruit [R09.89] 04/30/2012  . Hypertension [I10] 04/27/2012  . Anxiety and depression [F41.8] 04/27/2012  . Hyperlipemia [E78.5] 04/27/2012  . Cervical spondylosis with radiculopathy [M47.22] 04/19/2012    Total Time spent with patient: 45 minutes  Subjective:   [name] [name] is a 76 y.o. female patient admitted with suicide plan and paranoia.  HPI:  On admission:  76 y.o. female. Pt presents voluntarily to ED BIB EMS. Pt endorses SI with plan to stand or drive onto train tracks. Pt reports one  prior suicide attempt by overdose when pt was in her 35s. Pt also reports she feels there in something in the air at her home that is making her feel ill. She says it feels like there is "fiberglass" in her mouth and on her skin. Pt says, "I feel like I'm on fire." She sts she has trouble breathing in the house but breathes better when she is outside. Pt sts she still misses her son who died at age 41 approx 50 yrs ago. Pt reports depressed and anxious mood. Her affect is depressed. Pt sts in her late 2s she spent an entire year at [facility] for depression. She is oriented x 4. Pt denies homicidal thoughts or physical aggression. Pt denies having access to firearms. Pt denies having any legal problems at this time. She endorses poor appetite, insomnia, anhedonia, isolating, fatigue, tearfulness, irritability and worthlessness. 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 Pt says, "I feel like I've misplaced my own being." She says she used to see a psychiatrist she liked but pt's insurance wouldn't pay for psychiatrist any longer. She reports impaired short term memory. Pt reports she used to have a "problem with alcohol". She sts she quit drinking "cold [place]" on 07/10/97. Pt does not appear to be intoxicated or in withdrawal at this time. She endorses [facility]. Pt says that she hears voices or music in her head. She says the voices "tell me to get out". Pt says she interprets this as a suggestion to commit suicide. Pt reports excessive worry about her granddaughter who is a former drug addict. She reports she worries about "the young people" and becomes tearful about state of the country today.  Pt's daughter [name] [name] is bedside. She reports she has lived with pt for 6 yrs. She sts that she has had no physical symptoms from living in their house. She says pt's family avoids pt. Hunt reports pt is irritable and accuses daughter of stealing her money. She  reports she helps pt bathe and she cooks for pt. Hunt says that pt was able to stay on her own a few mos ago while [place] worked part time. She says that pt's depression seems to have worsened.   Today, she continues to endorse depression with suicidal ideations and plan to get run over by a train.  No paranoia noted today.  Agreeable to go to geriatric psychiatry inpatient for help.  Past Psychiatric History: depression  Risk to Self: Suicidal Ideation: Yes-Currently Present Suicidal Intent: No Is patient at risk for suicide?: Yes Suicidal Plan?: Yes-Currently Present Specify Current Suicidal Plan: walk or drive onto local train tracks Access to Means: Yes Specify Access to Suicidal Means: house is near train tracks What has been your use of drugs/alcohol within the last 12 months?: none How many times?: 1 (intentional overdose when pt approx 76 yo) Other Self Harm Risks: none Triggers for Past Attempts: Unpredictable (father died) Intentional Self Injurious Behavior: None Risk to Others: Homicidal Ideation: No Thoughts of Harm to Others: No Current Homicidal Intent: No Current Homicidal Plan: No Access to Homicidal Means: No Identified Victim: n/a History of harm to others?: No Assessment of Violence: None Noted Violent Behavior Description: pt denies hx violence Does patient have access to weapons?: No (daughter reports gun locked and pt has no access) Criminal Charges Pending?: No Does patient have a court date: No Prior Inpatient Therapy: Prior Inpatient Therapy: Yes Prior Therapy Dates: over several years Prior Therapy Facilty/Provider(s): [facility] when pt in her 74s, [place] Reason for Treatment: SI, MDD Prior Outpatient Therapy: Prior Outpatient Therapy: Yes Prior Therapy Dates:  in the past Prior Therapy Facilty/Provider(s): unknown Does patient have an ACCT team?: No Does patient have Intensive In-House Services?  : No Does patient have [facility] services? :  No Does patient have P4CC services?: No  Past Medical History:  Past Medical History:  Diagnosis Date  . Anxiety   . Arthritis    RA  . Carpal tunnel syndrome, bilateral   . Chronic kidney disease    CKD stage III, absence of left kidney  . Congenital absence of one kidney    Pt has right kidney only  . Depression   . GERD (gastroesophageal reflux disease)   . Headache   . Heart murmur   . Hypercholesteremia   . Hypertension   . Peripheral vascular disease ([place])    a. s/p R external iliac stent '06 with angioplasty '13 (Dr. Benjamine Sprague) b. 01/2016: s/p PTA/stenting in L common iliac artery (Dr. Gwenlyn Found)  . Polymyalgia rheumatica ([place])   . Psoriasis     Past Surgical History:  Procedure Laterality Date  . ABDOMINAL HYSTERECTOMY  1963   Partial,  Due to bleeding after delivery  . ANTERIOR CERVICAL DECOMP/DISCECTOMY FUSION  04/19/2012   Procedure: ANTERIOR CERVICAL DECOMPRESSION/DISCECTOMY FUSION 2 LEVELS;  Surgeon: Winfield Cunas, MD;  Location: [place] NEURO ORS;  Service: Neurosurgery;  Laterality: N/A;  Cervical four-five,Cervical five-six anterior cervical decompression with fusion plating and bonegraft possible posterior cervical decompression  . APPENDECTOMY    . CARPAL TUNNEL RELEASE  1990  . CERVICAL FUSION  04/19/2012  . EYE SURGERY    . ILIAC  ARTERY STENT    . PERIPHERAL VASCULAR CATHETERIZATION N/A 01/31/2016   Procedure: Abdominal Aortogram;  Surgeon: [name], MD;  Location: [place] CV LAB;  Service: Cardiovascular;  Laterality: N/A;  . PERIPHERAL VASCULAR CATHETERIZATION Bilateral 01/31/2016   Procedure: Lower Extremity Angiography;  Surgeon: [name], MD;  Location: [facility] CV LAB;  Service: Cardiovascular;  Laterality: Bilateral;  . PERIPHERAL VASCULAR CATHETERIZATION Left 01/31/2016   Procedure: Peripheral Vascular Intervention;  Surgeon: [name], MD;  Location: [place] CV LAB;  Service: Cardiovascular;  Laterality: Left;  ILIAC  .  TONSILLECTOMY     Family History:  Family History  Problem Relation Age of Onset  . Angina Mother   . Heart attack Father   . Melanoma Brother   . Cancer Sister     Lymphoma   Family Psychiatric  History: none Social History:  History  Alcohol Use No    Comment: QUIT IN 1999     History  Drug Use No    Social History   Social History  . Marital status: Widowed    Spouse name: N/A  . Number of children: N/A  . Years of education: N/A   Social History Main Topics  . Smoking status: Former Smoker    Packs/day: 1.00    Years: 40.00    Types: Cigarettes    Quit date: 07/24/2010  . Smokeless tobacco: Never Used     Comment: STARTED BACK JULY 2013 AND RECENTLY QUIT 03-10-2012  . Alcohol use No     Comment: QUIT IN 1999  . Drug use: No  . Sexual activity: No     Comment: 1st intercourse 76 yo-Fewer than 5 partners   Other Topics Concern  . None   Social History Narrative  . None   Additional Social History:    Allergies:   Allergies  Allergen Reactions  . Cyclobenzaprine Itching    Possible reaction per pt  . Statins Swelling, Rash and Other (See Comments)    Swelling involving tongue; also causes muscle pain    Labs:  Results for orders placed or performed during the hospital encounter of 09/20/16 (from the past 48 hour(s))  Comprehensive metabolic panel     Status: Abnormal   Collection Time: 09/20/16  2:24 PM  Result Value Ref Range   Sodium 139 135 - 145 mmol/L   Potassium 4.7 3.5 - 5.1 mmol/L   Chloride 107 101 - 111 mmol/L   CO2 26 22 - 32 mmol/L   Glucose, Bld 88 65 - 99 mg/dL   BUN 23 (H) 6 - 20 mg/dL   Creatinine, Ser 1.20 (H) 0.44 - 1.00 mg/dL   Calcium 9.7 8.9 - 10.3 mg/dL   Total Protein 7.4 6.5 - 8.1 g/dL   Albumin 4.7 3.5 - 5.0 g/dL   AST 24 15 - 41 U/L   ALT 9 (L) 14 - 54 U/L   Alkaline Phosphatase 54 38 - 126 U/L   Total Bilirubin 0.6 0.3 - 1.2 mg/dL   GFR calc non Af Amer 42 (L) >60 mL/min   GFR calc Af Amer 49 (L) >60 mL/min     Comment: (NOTE) The eGFR has been calculated using the CKD EPI equation. This calculation has not been validated in all clinical situations. eGFR's persistently <60 mL/min signify possible Chronic Kidney Disease.    Anion gap 6 5 - 15  CBC with Diff     Status: Abnormal   Collection Time: 09/20/16  2:24  PM  Result Value Ref Range   WBC 5.6 4.0 - 10.5 K/uL   RBC 3.60 (L) 3.87 - 5.11 MIL/uL   Hemoglobin 11.7 (L) 12.0 - 15.0 g/dL   HCT 34.5 (L) 36.0 - 46.0 %   MCV 95.8 78.0 - 100.0 fL   MCH 32.5 26.0 - 34.0 pg   MCHC 33.9 30.0 - 36.0 g/dL   RDW 13.4 11.5 - 15.5 %   Platelets 185 150 - 400 K/uL   Neutrophils Relative % 58 %   Neutro Abs 3.3 1.7 - 7.7 K/uL   Lymphocytes Relative 30 %   Lymphs Abs 1.7 0.7 - 4.0 K/uL   Monocytes Relative 9 %   Monocytes Absolute 0.5 0.1 - 1.0 K/uL   Eosinophils Relative 3 %   Eosinophils Absolute 0.2 0.0 - 0.7 K/uL   Basophils Relative 0 %   Basophils Absolute 0.0 0.0 - 0.1 K/uL  Ethanol     Status: None   Collection Time: 09/20/16  2:25 PM  Result Value Ref Range   Alcohol, Ethyl (B) <5 <5 mg/dL    Comment:        LOWEST DETECTABLE LIMIT FOR SERUM ALCOHOL IS 5 mg/dL FOR MEDICAL PURPOSES ONLY   Urine rapid drug screen (hosp performed)not at [facility]     Status: None   Collection Time: 09/20/16  2:25 PM  Result Value Ref Range   Opiates NONE DETECTED NONE DETECTED   Cocaine NONE DETECTED NONE DETECTED   Benzodiazepines NONE DETECTED NONE DETECTED   Amphetamines NONE DETECTED NONE DETECTED   Tetrahydrocannabinol NONE DETECTED NONE DETECTED   Barbiturates NONE DETECTED NONE DETECTED    Comment:        DRUG SCREEN FOR MEDICAL PURPOSES ONLY.  IF CONFIRMATION IS NEEDED FOR ANY PURPOSE, NOTIFY LAB WITHIN 5 DAYS.        LOWEST DETECTABLE LIMITS FOR URINE DRUG SCREEN Drug Class       Cutoff (ng/mL) Amphetamine      1000 Barbiturate      200 Benzodiazepine   761 Tricyclics       607 Opiates          300 Cocaine          300 THC               50   Acetaminophen level     Status: Abnormal   Collection Time: 09/20/16  2:25 PM  Result Value Ref Range   Acetaminophen (Tylenol), Serum <10 (L) 10 - 30 ug/mL    Comment:        THERAPEUTIC CONCENTRATIONS VARY SIGNIFICANTLY. A RANGE OF 10-30 ug/mL MAY BE AN EFFECTIVE CONCENTRATION FOR MANY PATIENTS. HOWEVER, SOME ARE BEST TREATED AT CONCENTRATIONS OUTSIDE THIS RANGE. ACETAMINOPHEN CONCENTRATIONS >150 ug/mL AT 4 HOURS AFTER INGESTION AND >50 ug/mL AT 12 HOURS AFTER INGESTION ARE OFTEN ASSOCIATED WITH TOXIC REACTIONS.   Salicylate level     Status: None   Collection Time: 09/20/16  2:25 PM  Result Value Ref Range   Salicylate Lvl 37.1 2.8 - 30.0 mg/dL  Urinalysis, Routine w reflex microscopic     Status: Abnormal   Collection Time: 09/20/16  2:25 PM  Result Value Ref Range   Color, Urine STRAW (A) YELLOW   APPearance CLEAR CLEAR   Specific Gravity, Urine 1.006 1.005 - 1.030   pH 5.0 5.0 - 8.0   Glucose, UA NEGATIVE NEGATIVE mg/dL   Hgb urine dipstick NEGATIVE NEGATIVE   Bilirubin Urine NEGATIVE NEGATIVE  Ketones, ur NEGATIVE NEGATIVE mg/dL   Protein, ur NEGATIVE NEGATIVE mg/dL   Nitrite NEGATIVE NEGATIVE   Leukocytes, UA NEGATIVE NEGATIVE    Current Facility-Administered Medications  Medication Dose Route Frequency Provider Last Rate Last Dose  . acetaminophen (TYLENOL) tablet 650 mg  650 mg Oral Q6H PRN [name], MD   650 mg at 09/20/16 1619  . aspirin EC tablet 81 mg  81 mg Oral Daily [name], MD   81 mg at 09/21/16 0958  . clopidogrel (PLAVIX) tablet 75 mg  75 mg Oral q morning - 10a [name], MD   75 mg at 09/21/16 1005  . diphenhydrAMINE (BENADRYL) capsule 25 mg  25 mg Oral Q6H PRN Leo Grosser, MD   25 mg at 09/20/16 2015  . dorzolamide (TRUSOPT) 2 % ophthalmic solution 2 drop  2 drop Both Eyes Daily [name], MD   2 drop at 09/21/16 1007  . fluticasone (FLONASE) 50 MCG/ACT nasal spray 1 spray  1 spray Each Nare Daily PRN [name], MD      . hydrOXYzine (ATARAX/VISTARIL) tablet 10 mg  10 mg Oral TID Lillyanna Glandon, MD      . latanoprost (XALATAN) 0.005 % ophthalmic solution 2 drop  2 drop Both Eyes QHS [name], MD   2 drop at 09/20/16 2134  . lisinopril (PRINIVIL,ZESTRIL) tablet 10 mg  10 mg Oral Daily [name], MD   10 mg at 09/21/16 0952  . metoprolol tartrate (LOPRESSOR) tablet 12.5 mg  12.5 mg Oral BID [name], MD   12.5 mg at 09/21/16 1005  . multivitamin with minerals tablet 1 tablet  1 tablet Oral Daily [name], MD   1 tablet at 09/21/16 [phone]  . pantoprazole (PROTONIX) EC tablet 20 mg  20 mg Oral Daily [name], MD   20 mg at 09/21/16 1005   Current Outpatient Prescriptions  Medication Sig Dispense Refill  . acetaminophen (TYLENOL) 325 MG tablet Take 650 mg by mouth every 6 (six) hours as needed for mild pain.    [name] aspirin EC 81 MG EC tablet Take 1 tablet (81 mg total) by mouth daily.    . Aspirin-Salicylamide-Caffeine (BC HEADACHE POWDER PO) Take 1 packet by mouth every 4 (four) hours as needed (for pain).    . chlorthalidone (HYGROTON) 25 MG tablet TAKE ONE TABLET BY MOUTH ONCE DAILY 30 tablet 11  . Cholecalciferol (VITAMIN D) 2000 UNITS tablet Take 2,000 Units by mouth daily.    . citalopram (CELEXA) 20 MG tablet Take 1 tablet (20 mg total) by mouth daily. 30 tablet 3  . clopidogrel (PLAVIX) 75 MG tablet TAKE ONE TABLET BY MOUTH IN THE MORNING 90 tablet 3  . diphenhydrAMINE (BENADRYL) 25 MG tablet Take 25 mg by mouth every 6 (six) hours as needed for allergies.    [name] dorzolamide (TRUSOPT) 2 % ophthalmic solution Place 2 drops into both eyes daily.    . fluticasone (FLONASE) 50 MCG/ACT nasal spray Place 1 spray into both nostrils daily as needed for allergies or rhinitis (congestion). 16 g 11  . latanoprost (XALATAN) 0.005 % ophthalmic solution Place 2 drops into both eyes at bedtime.     [name] lisinopril (PRINIVIL,ZESTRIL) 10 MG tablet Take 1 tablet (10 mg total) by mouth  daily. 90 tablet 3  . metoprolol tartrate (LOPRESSOR) 25 MG tablet Take 0.5 tablets (12.5 mg total) by mouth 2 (two) times daily. 30 tablet 11  . Multiple Vitamin (MULTIVITAMIN WITH MINERALS) TABS tablet Take 1  tablet by mouth daily.    . pantoprazole (PROTONIX) 20 MG tablet TAKE ONE TABLET BY MOUTH ONCE DAILY 90 tablet 1  . Polyethyl Glycol-Propyl Glycol (SYSTANE OP) Place 1 drop into both eyes 2 (two) times daily.    . polyethylene glycol (MIRALAX / GLYCOLAX) packet Take 17 g by mouth daily after supper.     . sodium chloride (MURO 128) 5 % ophthalmic ointment Place 1 application into both eyes 2 (two) times daily.     . traMADol (ULTRAM) 50 MG tablet Take 2 tablets (100 mg total) by mouth 2 (two) times daily as needed for moderate pain. 60 tablet 3    Musculoskeletal: Strength & Muscle Tone: within normal limits Gait & Station: normal Patient leans: N/A  Psychiatric Specialty Exam: Physical Exam  Constitutional: She is oriented to person, place, and time. She appears well-developed and well-nourished.  HENT:  Head: Normocephalic.  Neck: Normal range of motion.  Respiratory: Effort normal.  Musculoskeletal: Normal range of motion.  Neurological: She is alert and oriented to person, place, and time.  Psychiatric: Her speech is normal and behavior is normal. Judgment normal. Cognition and memory are normal. She exhibits a depressed mood. She expresses suicidal ideation. She expresses suicidal plans.    Review of Systems  Psychiatric/Behavioral: Positive for depression and suicidal ideas.  All other systems reviewed and are negative.   Blood pressure 111/58, pulse 69, temperature 98.7 F (37.1 C), temperature source Oral, resp. rate 18, SpO2 95 %.There is no height or weight on file to calculate BMI.  General Appearance: Casual  Eye Contact:  Good  Speech:  Normal Rate  Volume:  Decreased  Mood:  Depressed  Affect:  Congruent  Thought Process:  Coherent and Descriptions of  Associations: Intact  Orientation:  Full (Time, Place, and Person)  Thought Content:  Rumination  Suicidal Thoughts:  Yes.  with intent/plan  Homicidal Thoughts:  No  Memory:  Immediate;   Fair Recent;   Fair Remote;   Fair  Judgement:  Fair  Insight:  Fair  Psychomotor Activity:  Decreased  Concentration:  Concentration: Fair and Attention Span: Fair  Recall:  [employer] of Knowledge:  Fair  Language:  Good  Akathisia:  No  Handed:  Right  AIMS (if indicated):     Assets:  Housing Leisure Time Resilience Social Support  ADL's:  Intact  Cognition:  WNL  Sleep:        Treatment Plan Summary: Daily contact with patient to assess and evaluate symptoms and progress in treatment, Medication management and Plan major depressive disorder, recuurent, severe, without psychosis:  -Crisis stabilization -Medication management:  Stopped Celexa due to possible reaction, restarted medical medications, started Vistaril 10 mg TID for itching and allergic reaction and Benadryl 25 mg every six hours PRN itching for allergic reaction -Individual counseling   Disposition: Recommend psychiatric Inpatient admission when medically cleared.  Waylan Boga, NP 09/21/2016 10:55 AM  Patient seen face-to-face for psychiatric evaluation, chart reviewed and case discussed with the physician extender and developed treatment plan. Reviewed the information documented and agree with the treatment plan. Corena Pilgrim, MD

## 2016-09-22 DIAGNOSIS — F323 Major depressive disorder, single episode, severe with psychotic features: Secondary | ICD-10-CM | POA: Diagnosis not present

## 2016-09-23 DIAGNOSIS — F323 Major depressive disorder, single episode, severe with psychotic features: Secondary | ICD-10-CM | POA: Diagnosis not present

## 2016-09-24 DIAGNOSIS — D631 Anemia in chronic kidney disease: Secondary | ICD-10-CM | POA: Insufficient documentation

## 2016-09-24 DIAGNOSIS — N189 Chronic kidney disease, unspecified: Secondary | ICD-10-CM | POA: Insufficient documentation

## 2016-09-24 DIAGNOSIS — G8929 Other chronic pain: Secondary | ICD-10-CM | POA: Diagnosis not present

## 2016-09-24 DIAGNOSIS — M549 Dorsalgia, unspecified: Secondary | ICD-10-CM | POA: Diagnosis not present

## 2016-09-24 DIAGNOSIS — J3089 Other allergic rhinitis: Secondary | ICD-10-CM | POA: Diagnosis not present

## 2016-09-24 DIAGNOSIS — E782 Mixed hyperlipidemia: Secondary | ICD-10-CM | POA: Diagnosis not present

## 2016-09-24 DIAGNOSIS — N183 Chronic kidney disease, stage 3 unspecified: Secondary | ICD-10-CM | POA: Insufficient documentation

## 2016-09-24 DIAGNOSIS — I10 Essential (primary) hypertension: Secondary | ICD-10-CM | POA: Diagnosis not present

## 2016-09-24 DIAGNOSIS — H409 Unspecified glaucoma: Secondary | ICD-10-CM | POA: Diagnosis not present

## 2016-09-24 DIAGNOSIS — F323 Major depressive disorder, single episode, severe with psychotic features: Secondary | ICD-10-CM | POA: Diagnosis not present

## 2016-09-24 DIAGNOSIS — K219 Gastro-esophageal reflux disease without esophagitis: Secondary | ICD-10-CM | POA: Diagnosis not present

## 2016-09-24 DIAGNOSIS — N1832 Chronic kidney disease, stage 3b: Secondary | ICD-10-CM | POA: Insufficient documentation

## 2016-09-24 DIAGNOSIS — I739 Peripheral vascular disease, unspecified: Secondary | ICD-10-CM | POA: Diagnosis not present

## 2016-09-25 ENCOUNTER — Telehealth: Payer: Self-pay | Admitting: Family Medicine

## 2016-09-25 DIAGNOSIS — F323 Major depressive disorder, single episode, severe with psychotic features: Secondary | ICD-10-CM | POA: Diagnosis not present

## 2016-09-25 NOTE — Telephone Encounter (Signed)
No answer. LMOVM. Called to confirm appointment for 09/26/2016.

## 2016-09-26 ENCOUNTER — Ambulatory Visit: Payer: PPO | Admitting: Family Medicine

## 2016-09-26 DIAGNOSIS — F323 Major depressive disorder, single episode, severe with psychotic features: Secondary | ICD-10-CM | POA: Diagnosis not present

## 2016-09-26 DIAGNOSIS — N183 Chronic kidney disease, stage 3 (moderate): Secondary | ICD-10-CM | POA: Diagnosis not present

## 2016-09-26 DIAGNOSIS — G8929 Other chronic pain: Secondary | ICD-10-CM | POA: Diagnosis not present

## 2016-09-26 DIAGNOSIS — J3089 Other allergic rhinitis: Secondary | ICD-10-CM | POA: Diagnosis not present

## 2016-09-26 DIAGNOSIS — K219 Gastro-esophageal reflux disease without esophagitis: Secondary | ICD-10-CM | POA: Diagnosis not present

## 2016-09-26 DIAGNOSIS — D631 Anemia in chronic kidney disease: Secondary | ICD-10-CM | POA: Diagnosis not present

## 2016-09-26 DIAGNOSIS — N179 Acute kidney failure, unspecified: Secondary | ICD-10-CM | POA: Diagnosis not present

## 2016-09-26 DIAGNOSIS — M549 Dorsalgia, unspecified: Secondary | ICD-10-CM | POA: Diagnosis not present

## 2016-09-26 DIAGNOSIS — I10 Essential (primary) hypertension: Secondary | ICD-10-CM | POA: Diagnosis not present

## 2016-09-26 DIAGNOSIS — E782 Mixed hyperlipidemia: Secondary | ICD-10-CM | POA: Diagnosis not present

## 2016-09-27 DIAGNOSIS — F323 Major depressive disorder, single episode, severe with psychotic features: Secondary | ICD-10-CM | POA: Diagnosis not present

## 2016-09-27 DIAGNOSIS — M549 Dorsalgia, unspecified: Secondary | ICD-10-CM | POA: Diagnosis not present

## 2016-09-27 DIAGNOSIS — G8929 Other chronic pain: Secondary | ICD-10-CM | POA: Diagnosis not present

## 2016-09-27 DIAGNOSIS — I10 Essential (primary) hypertension: Secondary | ICD-10-CM | POA: Diagnosis not present

## 2016-09-27 DIAGNOSIS — N179 Acute kidney failure, unspecified: Secondary | ICD-10-CM | POA: Diagnosis not present

## 2016-09-27 DIAGNOSIS — N183 Chronic kidney disease, stage 3 (moderate): Secondary | ICD-10-CM | POA: Diagnosis not present

## 2016-09-27 DIAGNOSIS — D631 Anemia in chronic kidney disease: Secondary | ICD-10-CM | POA: Diagnosis not present

## 2016-09-27 DIAGNOSIS — K219 Gastro-esophageal reflux disease without esophagitis: Secondary | ICD-10-CM | POA: Diagnosis not present

## 2016-09-27 DIAGNOSIS — J3089 Other allergic rhinitis: Secondary | ICD-10-CM | POA: Diagnosis not present

## 2016-09-27 DIAGNOSIS — E782 Mixed hyperlipidemia: Secondary | ICD-10-CM | POA: Diagnosis not present

## 2016-09-28 ENCOUNTER — Institutional Professional Consult (permissible substitution): Payer: PPO | Admitting: Pulmonary Disease

## 2016-09-28 DIAGNOSIS — E782 Mixed hyperlipidemia: Secondary | ICD-10-CM | POA: Diagnosis not present

## 2016-09-28 DIAGNOSIS — D631 Anemia in chronic kidney disease: Secondary | ICD-10-CM | POA: Diagnosis not present

## 2016-09-28 DIAGNOSIS — G8929 Other chronic pain: Secondary | ICD-10-CM | POA: Diagnosis not present

## 2016-09-28 DIAGNOSIS — F323 Major depressive disorder, single episode, severe with psychotic features: Secondary | ICD-10-CM | POA: Diagnosis not present

## 2016-09-28 DIAGNOSIS — K219 Gastro-esophageal reflux disease without esophagitis: Secondary | ICD-10-CM | POA: Diagnosis not present

## 2016-09-28 DIAGNOSIS — J3089 Other allergic rhinitis: Secondary | ICD-10-CM | POA: Diagnosis not present

## 2016-09-28 DIAGNOSIS — N179 Acute kidney failure, unspecified: Secondary | ICD-10-CM | POA: Diagnosis not present

## 2016-09-28 DIAGNOSIS — N183 Chronic kidney disease, stage 3 (moderate): Secondary | ICD-10-CM | POA: Diagnosis not present

## 2016-09-28 DIAGNOSIS — M549 Dorsalgia, unspecified: Secondary | ICD-10-CM | POA: Diagnosis not present

## 2016-09-28 DIAGNOSIS — I10 Essential (primary) hypertension: Secondary | ICD-10-CM | POA: Diagnosis not present

## 2016-09-29 DIAGNOSIS — F323 Major depressive disorder, single episode, severe with psychotic features: Secondary | ICD-10-CM | POA: Diagnosis not present

## 2016-09-30 DIAGNOSIS — F323 Major depressive disorder, single episode, severe with psychotic features: Secondary | ICD-10-CM | POA: Diagnosis not present

## 2016-10-01 DIAGNOSIS — F323 Major depressive disorder, single episode, severe with psychotic features: Secondary | ICD-10-CM | POA: Diagnosis not present

## 2016-10-01 DIAGNOSIS — I10 Essential (primary) hypertension: Secondary | ICD-10-CM | POA: Diagnosis not present

## 2016-10-02 DIAGNOSIS — D631 Anemia in chronic kidney disease: Secondary | ICD-10-CM | POA: Diagnosis not present

## 2016-10-02 DIAGNOSIS — N183 Chronic kidney disease, stage 3 (moderate): Secondary | ICD-10-CM | POA: Diagnosis not present

## 2016-10-02 DIAGNOSIS — N179 Acute kidney failure, unspecified: Secondary | ICD-10-CM | POA: Diagnosis not present

## 2016-10-02 DIAGNOSIS — F323 Major depressive disorder, single episode, severe with psychotic features: Secondary | ICD-10-CM | POA: Diagnosis not present

## 2016-10-03 DIAGNOSIS — F323 Major depressive disorder, single episode, severe with psychotic features: Secondary | ICD-10-CM | POA: Diagnosis not present

## 2016-10-03 DIAGNOSIS — R040 Epistaxis: Secondary | ICD-10-CM | POA: Diagnosis not present

## 2016-10-03 DIAGNOSIS — R600 Localized edema: Secondary | ICD-10-CM | POA: Diagnosis not present

## 2016-10-04 DIAGNOSIS — G8929 Other chronic pain: Secondary | ICD-10-CM | POA: Diagnosis not present

## 2016-10-04 DIAGNOSIS — K219 Gastro-esophageal reflux disease without esophagitis: Secondary | ICD-10-CM | POA: Diagnosis not present

## 2016-10-04 DIAGNOSIS — E782 Mixed hyperlipidemia: Secondary | ICD-10-CM | POA: Diagnosis not present

## 2016-10-04 DIAGNOSIS — I739 Peripheral vascular disease, unspecified: Secondary | ICD-10-CM | POA: Diagnosis not present

## 2016-10-04 DIAGNOSIS — F323 Major depressive disorder, single episode, severe with psychotic features: Secondary | ICD-10-CM | POA: Diagnosis not present

## 2016-10-04 DIAGNOSIS — N183 Chronic kidney disease, stage 3 (moderate): Secondary | ICD-10-CM | POA: Diagnosis not present

## 2016-10-04 DIAGNOSIS — M549 Dorsalgia, unspecified: Secondary | ICD-10-CM | POA: Diagnosis not present

## 2016-10-04 DIAGNOSIS — I071 Rheumatic tricuspid insufficiency: Secondary | ICD-10-CM | POA: Diagnosis not present

## 2016-10-04 DIAGNOSIS — H409 Unspecified glaucoma: Secondary | ICD-10-CM | POA: Diagnosis not present

## 2016-10-04 DIAGNOSIS — I10 Essential (primary) hypertension: Secondary | ICD-10-CM | POA: Diagnosis not present

## 2016-10-04 DIAGNOSIS — D631 Anemia in chronic kidney disease: Secondary | ICD-10-CM | POA: Diagnosis not present

## 2016-10-04 DIAGNOSIS — N179 Acute kidney failure, unspecified: Secondary | ICD-10-CM | POA: Diagnosis not present

## 2016-10-04 DIAGNOSIS — J3089 Other allergic rhinitis: Secondary | ICD-10-CM | POA: Diagnosis not present

## 2016-10-05 DIAGNOSIS — K219 Gastro-esophageal reflux disease without esophagitis: Secondary | ICD-10-CM | POA: Diagnosis not present

## 2016-10-05 DIAGNOSIS — R609 Edema, unspecified: Secondary | ICD-10-CM | POA: Diagnosis not present

## 2016-10-05 DIAGNOSIS — F323 Major depressive disorder, single episode, severe with psychotic features: Secondary | ICD-10-CM | POA: Diagnosis not present

## 2016-10-05 DIAGNOSIS — E782 Mixed hyperlipidemia: Secondary | ICD-10-CM | POA: Diagnosis not present

## 2016-10-05 DIAGNOSIS — I739 Peripheral vascular disease, unspecified: Secondary | ICD-10-CM | POA: Diagnosis not present

## 2016-10-05 DIAGNOSIS — M549 Dorsalgia, unspecified: Secondary | ICD-10-CM | POA: Diagnosis not present

## 2016-10-05 DIAGNOSIS — N183 Chronic kidney disease, stage 3 (moderate): Secondary | ICD-10-CM | POA: Diagnosis not present

## 2016-10-05 DIAGNOSIS — J3089 Other allergic rhinitis: Secondary | ICD-10-CM | POA: Diagnosis not present

## 2016-10-05 DIAGNOSIS — I10 Essential (primary) hypertension: Secondary | ICD-10-CM | POA: Diagnosis not present

## 2016-10-05 DIAGNOSIS — J9811 Atelectasis: Secondary | ICD-10-CM | POA: Diagnosis not present

## 2016-10-05 DIAGNOSIS — H409 Unspecified glaucoma: Secondary | ICD-10-CM | POA: Diagnosis not present

## 2016-10-05 DIAGNOSIS — J984 Other disorders of lung: Secondary | ICD-10-CM | POA: Diagnosis not present

## 2016-10-05 DIAGNOSIS — G8929 Other chronic pain: Secondary | ICD-10-CM | POA: Diagnosis not present

## 2016-10-05 DIAGNOSIS — N179 Acute kidney failure, unspecified: Secondary | ICD-10-CM | POA: Diagnosis not present

## 2016-10-05 DIAGNOSIS — D631 Anemia in chronic kidney disease: Secondary | ICD-10-CM | POA: Diagnosis not present

## 2016-10-06 DIAGNOSIS — D631 Anemia in chronic kidney disease: Secondary | ICD-10-CM | POA: Diagnosis not present

## 2016-10-06 DIAGNOSIS — J3089 Other allergic rhinitis: Secondary | ICD-10-CM | POA: Diagnosis not present

## 2016-10-06 DIAGNOSIS — E782 Mixed hyperlipidemia: Secondary | ICD-10-CM | POA: Diagnosis not present

## 2016-10-06 DIAGNOSIS — N183 Chronic kidney disease, stage 3 (moderate): Secondary | ICD-10-CM | POA: Diagnosis not present

## 2016-10-06 DIAGNOSIS — N179 Acute kidney failure, unspecified: Secondary | ICD-10-CM | POA: Diagnosis not present

## 2016-10-06 DIAGNOSIS — I10 Essential (primary) hypertension: Secondary | ICD-10-CM | POA: Diagnosis not present

## 2016-10-06 DIAGNOSIS — H409 Unspecified glaucoma: Secondary | ICD-10-CM | POA: Diagnosis not present

## 2016-10-06 DIAGNOSIS — K219 Gastro-esophageal reflux disease without esophagitis: Secondary | ICD-10-CM | POA: Diagnosis not present

## 2016-10-06 DIAGNOSIS — F323 Major depressive disorder, single episode, severe with psychotic features: Secondary | ICD-10-CM | POA: Diagnosis not present

## 2016-10-06 DIAGNOSIS — M549 Dorsalgia, unspecified: Secondary | ICD-10-CM | POA: Diagnosis not present

## 2016-10-06 DIAGNOSIS — I739 Peripheral vascular disease, unspecified: Secondary | ICD-10-CM | POA: Diagnosis not present

## 2016-10-06 DIAGNOSIS — G8929 Other chronic pain: Secondary | ICD-10-CM | POA: Diagnosis not present

## 2016-10-07 DIAGNOSIS — N179 Acute kidney failure, unspecified: Secondary | ICD-10-CM | POA: Diagnosis not present

## 2016-10-07 DIAGNOSIS — D631 Anemia in chronic kidney disease: Secondary | ICD-10-CM | POA: Diagnosis not present

## 2016-10-07 DIAGNOSIS — F323 Major depressive disorder, single episode, severe with psychotic features: Secondary | ICD-10-CM | POA: Diagnosis not present

## 2016-10-07 DIAGNOSIS — I10 Essential (primary) hypertension: Secondary | ICD-10-CM | POA: Diagnosis not present

## 2016-10-07 DIAGNOSIS — N183 Chronic kidney disease, stage 3 (moderate): Secondary | ICD-10-CM | POA: Diagnosis not present

## 2016-10-07 DIAGNOSIS — K219 Gastro-esophageal reflux disease without esophagitis: Secondary | ICD-10-CM | POA: Diagnosis not present

## 2016-10-08 DIAGNOSIS — N183 Chronic kidney disease, stage 3 (moderate): Secondary | ICD-10-CM | POA: Diagnosis not present

## 2016-10-08 DIAGNOSIS — D631 Anemia in chronic kidney disease: Secondary | ICD-10-CM | POA: Diagnosis not present

## 2016-10-08 DIAGNOSIS — F323 Major depressive disorder, single episode, severe with psychotic features: Secondary | ICD-10-CM | POA: Diagnosis not present

## 2016-10-08 DIAGNOSIS — N179 Acute kidney failure, unspecified: Secondary | ICD-10-CM | POA: Diagnosis not present

## 2016-10-08 DIAGNOSIS — J9811 Atelectasis: Secondary | ICD-10-CM | POA: Diagnosis not present

## 2016-10-08 DIAGNOSIS — K219 Gastro-esophageal reflux disease without esophagitis: Secondary | ICD-10-CM | POA: Diagnosis not present

## 2016-10-08 DIAGNOSIS — J90 Pleural effusion, not elsewhere classified: Secondary | ICD-10-CM | POA: Diagnosis not present

## 2016-10-08 DIAGNOSIS — R0602 Shortness of breath: Secondary | ICD-10-CM | POA: Diagnosis not present

## 2016-10-08 DIAGNOSIS — I10 Essential (primary) hypertension: Secondary | ICD-10-CM | POA: Diagnosis not present

## 2016-10-09 DIAGNOSIS — F323 Major depressive disorder, single episode, severe with psychotic features: Secondary | ICD-10-CM | POA: Diagnosis not present

## 2016-10-10 DIAGNOSIS — F323 Major depressive disorder, single episode, severe with psychotic features: Secondary | ICD-10-CM | POA: Diagnosis not present

## 2016-10-11 DIAGNOSIS — F323 Major depressive disorder, single episode, severe with psychotic features: Secondary | ICD-10-CM | POA: Diagnosis not present

## 2016-10-12 DIAGNOSIS — J3089 Other allergic rhinitis: Secondary | ICD-10-CM | POA: Diagnosis not present

## 2016-10-12 DIAGNOSIS — N179 Acute kidney failure, unspecified: Secondary | ICD-10-CM | POA: Diagnosis not present

## 2016-10-12 DIAGNOSIS — G8929 Other chronic pain: Secondary | ICD-10-CM | POA: Diagnosis not present

## 2016-10-12 DIAGNOSIS — M549 Dorsalgia, unspecified: Secondary | ICD-10-CM | POA: Diagnosis not present

## 2016-10-12 DIAGNOSIS — N183 Chronic kidney disease, stage 3 (moderate): Secondary | ICD-10-CM | POA: Diagnosis not present

## 2016-10-12 DIAGNOSIS — D631 Anemia in chronic kidney disease: Secondary | ICD-10-CM | POA: Diagnosis not present

## 2016-10-12 DIAGNOSIS — I10 Essential (primary) hypertension: Secondary | ICD-10-CM | POA: Diagnosis not present

## 2016-10-12 DIAGNOSIS — E782 Mixed hyperlipidemia: Secondary | ICD-10-CM | POA: Diagnosis not present

## 2016-10-12 DIAGNOSIS — K219 Gastro-esophageal reflux disease without esophagitis: Secondary | ICD-10-CM | POA: Diagnosis not present

## 2016-10-12 DIAGNOSIS — F323 Major depressive disorder, single episode, severe with psychotic features: Secondary | ICD-10-CM | POA: Diagnosis not present

## 2016-10-13 DIAGNOSIS — E782 Mixed hyperlipidemia: Secondary | ICD-10-CM | POA: Diagnosis not present

## 2016-10-13 DIAGNOSIS — J3089 Other allergic rhinitis: Secondary | ICD-10-CM | POA: Diagnosis not present

## 2016-10-13 DIAGNOSIS — R05 Cough: Secondary | ICD-10-CM | POA: Diagnosis not present

## 2016-10-13 DIAGNOSIS — N183 Chronic kidney disease, stage 3 (moderate): Secondary | ICD-10-CM | POA: Diagnosis not present

## 2016-10-13 DIAGNOSIS — N179 Acute kidney failure, unspecified: Secondary | ICD-10-CM | POA: Diagnosis not present

## 2016-10-13 DIAGNOSIS — J9811 Atelectasis: Secondary | ICD-10-CM | POA: Diagnosis not present

## 2016-10-13 DIAGNOSIS — G8929 Other chronic pain: Secondary | ICD-10-CM | POA: Diagnosis not present

## 2016-10-13 DIAGNOSIS — F323 Major depressive disorder, single episode, severe with psychotic features: Secondary | ICD-10-CM | POA: Diagnosis not present

## 2016-10-13 DIAGNOSIS — I10 Essential (primary) hypertension: Secondary | ICD-10-CM | POA: Diagnosis not present

## 2016-10-13 DIAGNOSIS — M549 Dorsalgia, unspecified: Secondary | ICD-10-CM | POA: Diagnosis not present

## 2016-10-13 DIAGNOSIS — D631 Anemia in chronic kidney disease: Secondary | ICD-10-CM | POA: Diagnosis not present

## 2016-10-13 DIAGNOSIS — K219 Gastro-esophageal reflux disease without esophagitis: Secondary | ICD-10-CM | POA: Diagnosis not present

## 2016-10-14 DIAGNOSIS — F323 Major depressive disorder, single episode, severe with psychotic features: Secondary | ICD-10-CM | POA: Diagnosis not present

## 2016-10-16 ENCOUNTER — Telehealth: Payer: Self-pay | Admitting: Family Medicine

## 2016-10-16 NOTE — Telephone Encounter (Signed)
Patient confirmed appointment (10/17/16 10:15 am). Advised to arrive early for check-in and to bring all current medications. No further concerns at this time.

## 2016-10-17 ENCOUNTER — Encounter: Payer: Self-pay | Admitting: Family Medicine

## 2016-10-17 ENCOUNTER — Ambulatory Visit (INDEPENDENT_AMBULATORY_CARE_PROVIDER_SITE_OTHER): Payer: PPO | Admitting: Family Medicine

## 2016-10-17 VITALS — BP 130/66 | HR 94 | Temp 98.3°F | Ht 59.0 in | Wt 148.4 lb

## 2016-10-17 DIAGNOSIS — I872 Venous insufficiency (chronic) (peripheral): Secondary | ICD-10-CM

## 2016-10-17 DIAGNOSIS — F332 Major depressive disorder, recurrent severe without psychotic features: Secondary | ICD-10-CM | POA: Diagnosis not present

## 2016-10-17 DIAGNOSIS — J441 Chronic obstructive pulmonary disease with (acute) exacerbation: Secondary | ICD-10-CM | POA: Diagnosis not present

## 2016-10-17 DIAGNOSIS — Z79899 Other long term (current) drug therapy: Secondary | ICD-10-CM | POA: Diagnosis not present

## 2016-10-17 NOTE — Assessment & Plan Note (Signed)
>>  ASSESSMENT AND PLAN FOR STASIS DERMATITIS OF BOTH LEGS WRITTEN ON 10/17/2016  6:21 PM BY MCKEAG, IAN D, MD  Patient continues to have some swelling in her legs bilaterally. No significant complications at this time. She is wearing some compression hose upon presentation. - Continue compression stockings. - Follow-up in 2-4 weeks

## 2016-10-17 NOTE — Assessment & Plan Note (Signed)
Patient continues to have some swelling in her legs bilaterally. No significant complications at this time. She is wearing some compression hose upon presentation. - Continue compression stockings. - Follow-up in 2-4 weeks

## 2016-10-17 NOTE — Patient Instructions (Signed)
It was a pleasure seeing you today in our clinic. Today we discussed your hospital follow-up. Here is the treatment plan we have discussed and agreed upon together:   - Continue taking your medications as prescribed - I'm obtaining some blood tests today. I will mail you these results next week. - I would like to see you back in 2-4 weeks to discuss your visit with your therapist.

## 2016-10-17 NOTE — Progress Notes (Signed)
HPI  CC: Hospital follow-up Patient is here for hospital follow-up. She was admitted earlier in the month due to suicidal ideations. She states that she feels significantly better since her time in the hospital. She denies any suicidal ideations at this time. She denies any homicidal ideations at this time. She endorses good compliance with all of her medications.  The only issues that she is complaining of today is some peripheral edema. She states that this is gotten slightly worse since discharge and she was wondering if there is anything else that she could do. She denies any significant pain or tenderness in her feet or legs but states that the edema causes her some discomfort. She denies any headache, blurred vision, chest pain, shortness of breath, nausea, vomiting, diarrhea, dysuria, abdominal pain, numbness, weakness, or paresthesias.  Review of Systems See HPI for ROS.   CC, SH/smoking status, and VS noted  Objective: BP 130/66   Pulse 94   Temp 98.3 F (36.8 C) (Oral)   Ht 4\' 11"  (1.499 m)   Wt 148 lb 6.4 oz (67.3 kg)   SpO2 95%   BMI 29.97 kg/m  Gen: NAD, alert, cooperative, and pleasant. HEENT: NCAT, EOMI, PERRL, MMM CV: RRR, no murmur Resp: CTAB, no wheezes, non-labored Ext: +1 pitting edema bilaterally to mid lower leg, warm, pulses intact, some chronic venous stasis changes noted. No evidence of infection. Neuro: Alert and oriented, Speech clear, No gross deficits   Assessment and plan:  Major depressive disorder, recurrent severe without psychotic features [facility]) Patient is here to follow-up on her hospital visit. She states that she is doing well and is pleased with her current medication regimen. She endorses good compliance with her medication regimen at this time. Patient denies any HI/SI - Continue medication regimen - Patient has appointment with therapist tomorrow; encouraged keeping this appointment. - Follow-up in 2-4 weeks to discuss long-term  treatment plan.  Stasis dermatitis of both legs Patient continues to have some swelling in her legs bilaterally. No significant complications at this time. She is wearing some compression hose upon presentation. - Continue compression stockings. - Follow-up in 2-4 weeks   Orders Placed This Encounter  Procedures  . Comprehensive metabolic panel    Order Specific Question:   Has the patient fasted?    Answer:   No  . TSH  . CBC    Meds ordered this encounter  Medications  . citalopram (CELEXA) 20 MG tablet    Sig: Take 20 mg by mouth daily.  [name] albuterol (PROVENTIL) (2.5 MG/3ML) 0.083% nebulizer solution    Sig: Take 2.5 mg by nebulization every 4 (four) hours as needed for shortness of breath.  . furosemide (LASIX) 20 MG tablet    Sig: Take 20 mg by mouth daily.  [name] DISCONTD: gabapentin (NEURONTIN) 100 MG capsule    Sig: Take 100 mg by mouth 3 (three) times daily.  [name] gabapentin (NEURONTIN) 100 MG capsule    Sig: Take 100 mg by mouth 3 (three) times daily.  . iron polysaccharides (NIFEREX) 150 MG capsule    Sig: Take 150 mg by mouth at bedtime.  . lamoTRIgine (LAMICTAL) 25 MG tablet    Sig: Take 50 mg by mouth daily.  . mirtazapine (REMERON) 15 MG tablet    Sig: Take 15 mg by mouth at bedtime.  . phenol (CHLORASEPTIC) 1.4 % LIQD    Sig: Use as directed 2 sprays in the mouth or throat every 4 (four) hours as needed for  sore throat.  . polyvinyl alcohol (LIQUIFILM TEARS) 1.4 % ophthalmic solution    Sig: Place 1 drop into both eyes 2 (two) times daily.  . QUEtiapine (SEROQUEL) 50 MG tablet    Sig: Take 50 mg by mouth at bedtime.  . traZODone (DESYREL) 50 MG tablet    Sig: Take 50 mg by mouth at bedtime as needed for sleep.     Elberta Leatherwood, MD,MS,  PGY3 10/17/2016 6:21 PM

## 2016-10-17 NOTE — Assessment & Plan Note (Signed)
Patient is here to follow-up on her hospital visit. She states that she is doing well and is pleased with her current medication regimen. She endorses good compliance with her medication regimen at this time. Patient denies any HI/SI - Continue medication regimen - Patient has appointment with therapist tomorrow; encouraged keeping this appointment. - Follow-up in 2-4 weeks to discuss long-term treatment plan.

## 2016-10-18 ENCOUNTER — Telehealth: Payer: Self-pay | Admitting: Family Medicine

## 2016-10-18 DIAGNOSIS — F333 Major depressive disorder, recurrent, severe with psychotic symptoms: Secondary | ICD-10-CM | POA: Diagnosis not present

## 2016-10-18 LAB — COMPREHENSIVE METABOLIC PANEL
ALT: 7 IU/L (ref 0–32)
AST: 21 IU/L (ref 0–40)
Albumin/Globulin Ratio: 1.5 (ref 1.2–2.2)
Albumin: 4.1 g/dL (ref 3.5–4.8)
Alkaline Phosphatase: 73 IU/L (ref 39–117)
BUN/Creatinine Ratio: 19 (ref 12–28)
BUN: 24 mg/dL (ref 8–27)
Bilirubin Total: 0.3 mg/dL (ref 0.0–1.2)
CO2: 26 mmol/L (ref 18–29)
Calcium: 9.8 mg/dL (ref 8.7–10.3)
Chloride: 97 mmol/L (ref 96–106)
Creatinine, Ser: 1.24 mg/dL — ABNORMAL HIGH (ref 0.57–1.00)
GFR calc Af Amer: 48 mL/min/{1.73_m2} — ABNORMAL LOW (ref >59)
GFR calc non Af Amer: 42 mL/min/{1.73_m2} — ABNORMAL LOW (ref >59)
Globulin, Total: 2.7 g/dL (ref 1.5–4.5)
Glucose: 92 mg/dL (ref 65–99)
Potassium: 4.2 mmol/L (ref 3.5–5.2)
Sodium: 138 mmol/L (ref 134–144)
Total Protein: 6.8 g/dL (ref 6.0–8.5)

## 2016-10-18 LAB — CBC
Hematocrit: 32.4 % — ABNORMAL LOW (ref 34.0–46.6)
Hemoglobin: 10.7 g/dL — ABNORMAL LOW (ref 11.1–15.9)
MCH: 32.1 pg (ref 26.6–33.0)
MCHC: 33 g/dL (ref 31.5–35.7)
MCV: 97 fL (ref 79–97)
Platelets: 380 10*3/uL — ABNORMAL HIGH (ref 150–379)
RBC: 3.33 x10E6/uL — ABNORMAL LOW (ref 3.77–5.28)
RDW: 14.4 % (ref 12.3–15.4)
WBC: 8.8 10*3/uL (ref 3.4–10.8)

## 2016-10-18 LAB — TSH: TSH: 0.871 u[IU]/mL (ref 0.450–4.500)

## 2016-10-18 NOTE — Telephone Encounter (Signed)
Daughter calling to request refill of:  Name of Medication(s):  prednisone Last date of OV:  10-17-16 [facility]   Will route refill request to Clinic RN.  Discussed with patient policy to call pharmacy for future refills.  Also, discussed refills may take up to 48 hours to approve or deny.  Renella Cunas

## 2016-10-19 NOTE — Telephone Encounter (Signed)
Patient is not on prednisone long-term. No need for refill.

## 2016-10-24 DIAGNOSIS — F3341 Major depressive disorder, recurrent, in partial remission: Secondary | ICD-10-CM | POA: Diagnosis not present

## 2016-10-26 ENCOUNTER — Encounter: Payer: Self-pay | Admitting: Family Medicine

## 2016-10-26 ENCOUNTER — Ambulatory Visit (INDEPENDENT_AMBULATORY_CARE_PROVIDER_SITE_OTHER): Payer: PPO | Admitting: Family Medicine

## 2016-10-26 DIAGNOSIS — I872 Venous insufficiency (chronic) (peripheral): Secondary | ICD-10-CM | POA: Diagnosis not present

## 2016-10-26 DIAGNOSIS — R634 Abnormal weight loss: Secondary | ICD-10-CM | POA: Insufficient documentation

## 2016-10-26 DIAGNOSIS — F332 Major depressive disorder, recurrent severe without psychotic features: Secondary | ICD-10-CM | POA: Diagnosis not present

## 2016-10-26 MED ORDER — ALBUTEROL SULFATE (2.5 MG/3ML) 0.083% IN NEBU: 2.5000 mg | INHALATION_SOLUTION | RESPIRATORY_TRACT | 2 refills | Status: DC | PRN

## 2016-10-26 NOTE — Assessment & Plan Note (Signed)
Significantly improved: Patient states she feels wonderful. Her mood during today's visit was substantially improved from previous. Patient has been compliant with medications to a certain extent. Patient has not been taking Remeron or Seroquel due to their side effects. I believe that her time with behavioral health/therapy has been irreplaceable and has had a significant impact on her status today. - Encourage continued visits at [facility] continued medication compliance with current regimen (I have removed Seroquel and Remeron from her regimen) - Follow-up 4-8 weeks

## 2016-10-26 NOTE — Patient Instructions (Addendum)
It was a pleasure seeing you today in our clinic. Today we discussed your medications and long-term care. Here is the treatment plan we have discussed and agreed upon together:   - I have completely updated your medication list. - Continue taking these medications as prescribed. - I hope the issue with your home and the [facility] gets worked out! - Try you best to continue using your compression stockings. Also, I would hold off on using the hydrogen peroxide on your legs. Instead try regular Vaseline.  - Let me know if you have anything come up that you would like to discuss. - I would like to see you back here in 4-8 weeks. If you continue to lose weight I would like to see you even sooner!

## 2016-10-26 NOTE — Assessment & Plan Note (Signed)
>>  ASSESSMENT AND PLAN FOR STASIS DERMATITIS OF BOTH LEGS WRITTEN ON 10/26/2016  7:16 PM BY MCKEAG, IAN D, MD  Improved: Patient states that the swelling in her legs have improved since our last visit. No evidence of cellulitis or skin wounds noted. - Discussed avoidance of regular hydroperoxide use - Encouraged application of Vaseline - Encouraged regular elevation of legs and use of compression stockings. - Will monitor

## 2016-10-26 NOTE — Assessment & Plan Note (Signed)
Improved: Patient states that the swelling in her legs have improved since our last visit. No evidence of cellulitis or skin wounds noted. - Discussed avoidance of regular hydroperoxide use - Encouraged application of Vaseline - Encouraged regular elevation of legs and use of compression stockings. - Will monitor

## 2016-10-26 NOTE — Progress Notes (Signed)
HPI  CC: MDD, venous insufficiency, and weight loss MDD: Patient states that she feels significantly better. She endorses significantly improved symptoms and states that she hasn't "felt this good in 4-5 years." She denies any persistent symptoms of overwhelming depression or sadness. Denies any SI/HI. She was excited to tell me about her recent visits at the [facility], and her therapy sessions. She endorses good compliance with her medications at this time, however she states that there were some which she has stopped taking due to persistent fatigue/drowsiness.  Venous insufficiency: Patient states that her venous insufficiency seems to have improved since our last visit. She states that she regularly props her legs up but does not always use her compression stockings. Recently, she has applied-peroxide to her legs and she thinks that this has made him feel better. She denies any significant pain, persistent bleeding or drainage, or new swelling.  Weight loss:  Patient states that she feels as though she has lost weight. Upon obtaining vitals she noted that she had indeed lost approximately 15 pounds since our last visit. Patient states that she thinks she is eating well. Her appetite has been present. However, after some time she states that she has had some conflict with her daughter and this may have caused her to skip some meals. She denies any nausea, vomiting, or diarrhea. No night sweats, or fever. No significant fatigue.  Review of Systems See HPI for ROS.   CC, SH/smoking status, and VS noted  Objective: BP 108/60   Pulse 100   Temp 98 F (36.7 C) (Oral)   Ht 4\' 11"  (1.499 m)   Wt 133 lb 6.4 oz (60.5 kg)   SpO2 97%   BMI 26.94 kg/m  Gen: NAD, alert, cooperative, and pleasant. CV: Well-perfused, capillary refill <3sec Resp: non-labored Ext: Some evidence of chronic venous stasis, edema improved(+0-1), no evidence of open wounds or cellulitis. Ambulating at  baseline Neuro: Alert and oriented, Speech clear, No gross deficits   Assessment and plan:  Major depressive disorder, recurrent severe without psychotic features ([place]) Significantly improved: Patient states she feels wonderful. Her mood during today's visit was substantially improved from previous. Patient has been compliant with medications to a certain extent. Patient has not been taking Remeron or Seroquel due to their side effects. I believe that her time with behavioral health/therapy has been irreplaceable and has had a significant impact on her status today. - Encourage continued visits at [facility] continued medication compliance with current regimen (I have removed Seroquel and Remeron from her regimen) - Follow-up 4-8 weeks  Stasis dermatitis of both legs Improved: Patient states that the swelling in her legs have improved since our last visit. No evidence of cellulitis or skin wounds noted. - Discussed avoidance of regular hydroperoxide use - Encouraged application of Vaseline - Encouraged regular elevation of legs and use of compression stockings. - Will monitor  Loss of weight New: Patient noted to have lost approximately 15 lbs. since our last visit. Patient does not know how this occurred and endorses good appetite at this time. No nausea, vomiting, or diarrhea. Etiology unknown at this time.  - I've asked patient to monitor this with a scale at home. - Strict return precautions discussed. - Will follow-up in 4-8 weeks, or sooner if weight loss persists.   Meds ordered this encounter  Medications  . albuterol (PROVENTIL) (2.5 MG/3ML) 0.083% nebulizer solution    Sig: Take 3 mLs (2.5 mg total) by nebulization every 4 (  four) hours as needed for shortness of breath.    Dispense:  75 mL    Refill:  2     Elberta Leatherwood, MD,MS,  PGY3 10/26/2016 7:19 PM

## 2016-10-26 NOTE — Assessment & Plan Note (Signed)
New: Patient noted to have lost approximately 15 lbs. since our last visit. Patient does not know how this occurred and endorses good appetite at this time. No nausea, vomiting, or diarrhea. Etiology unknown at this time.  - I've asked patient to monitor this with a scale at home. - Strict return precautions discussed. - Will follow-up in 4-8 weeks, or sooner if weight loss persists.

## 2016-11-02 DIAGNOSIS — H04123 Dry eye syndrome of bilateral lacrimal glands: Secondary | ICD-10-CM | POA: Diagnosis not present

## 2016-11-02 DIAGNOSIS — H21233 Degeneration of iris (pigmentary), bilateral: Secondary | ICD-10-CM | POA: Diagnosis not present

## 2016-11-02 DIAGNOSIS — H401333 Pigmentary glaucoma, bilateral, severe stage: Secondary | ICD-10-CM | POA: Diagnosis not present

## 2016-11-02 DIAGNOSIS — H401233 Low-tension glaucoma, bilateral, severe stage: Secondary | ICD-10-CM | POA: Diagnosis not present

## 2016-11-03 ENCOUNTER — Other Ambulatory Visit: Payer: Self-pay | Admitting: *Deleted

## 2016-11-03 MED ORDER — LAMOTRIGINE 25 MG PO TABS: 50.0000 mg | ORAL_TABLET | Freq: Every day | ORAL | 0 refills | Status: DC

## 2016-11-03 MED ORDER — POLYSACCHARIDE IRON COMPLEX 150 MG PO CAPS: 150.0000 mg | ORAL_CAPSULE | Freq: Every day | ORAL | 0 refills | Status: DC

## 2016-11-03 MED ORDER — GABAPENTIN 100 MG PO CAPS: 100.0000 mg | ORAL_CAPSULE | Freq: Three times a day (TID) | ORAL | 0 refills | Status: DC

## 2016-11-03 MED ORDER — FUROSEMIDE 20 MG PO TABS: 20.0000 mg | ORAL_TABLET | Freq: Every day | ORAL | 0 refills | Status: DC

## 2016-11-03 MED ORDER — LISINOPRIL 10 MG PO TABS: 10.0000 mg | ORAL_TABLET | Freq: Every day | ORAL | 0 refills | Status: DC

## 2016-11-03 NOTE — Telephone Encounter (Signed)
Per Dr. Eyvonne Mechanic note, he removed Seroquel and Remeron from her regimen at last visit.  Refilled other meds x 1 month supply.  Archie Patten, MD [facility] Family Medicine Resident  11/03/2016, 12:29 PM

## 2016-11-03 NOTE — Telephone Encounter (Signed)
Pt walked in the office stating that she told you that she lost her medications, but she found them in her front seat. She states she needs refills on her medications. Only 2 I didn't see in her chart and they were, Quetiapine Fumarat 50mg  tabs and mirtazapine 15mg  tabs. Deseree Kennon Holter, CMA

## 2016-11-07 ENCOUNTER — Inpatient Hospital Stay: Payer: PPO | Admitting: Family Medicine

## 2016-11-09 ENCOUNTER — Ambulatory Visit
Admission: RE | Admit: 2016-11-09 | Discharge: 2016-11-09 | Disposition: A | Discharge status: Home / Self Care | Payer: PPO | Source: Ambulatory Visit | Attending: Family Medicine | Admitting: Family Medicine

## 2016-11-09 DIAGNOSIS — F3341 Major depressive disorder, recurrent, in partial remission: Secondary | ICD-10-CM | POA: Diagnosis not present

## 2016-11-09 DIAGNOSIS — Z1231 Encounter for screening mammogram for malignant neoplasm of breast: Secondary | ICD-10-CM | POA: Diagnosis not present

## 2016-11-24 DIAGNOSIS — F3341 Major depressive disorder, recurrent, in partial remission: Secondary | ICD-10-CM | POA: Diagnosis not present

## 2016-11-27 ENCOUNTER — Encounter: Payer: Self-pay | Admitting: Internal Medicine

## 2016-11-27 ENCOUNTER — Ambulatory Visit: Payer: Self-pay | Admitting: Family Medicine

## 2016-11-27 ENCOUNTER — Ambulatory Visit (INDEPENDENT_AMBULATORY_CARE_PROVIDER_SITE_OTHER): Payer: PPO | Admitting: Internal Medicine

## 2016-11-27 DIAGNOSIS — R634 Abnormal weight loss: Secondary | ICD-10-CM

## 2016-11-27 NOTE — Progress Notes (Signed)
   [facility] Phone: [phone]   Date of Visit: 11/27/2016   HPI:  Concern for unintentional Weight Loss:  - seen in clinic by PCP on 10/26/2016 and noted to have 15 lb loss in a little over 1 week.  - per PCP note, there was conflict with her daughter which may have caused her to skip some meals  - reports she does not check her weight at home. Reports she has good appetite. Reports she has had non-bloody diarrhea over the past week but this is improving and now she is having stools that are more formed.  - no fevers or chills  - history of basal cell carcinoma but sees dermatology regularly  - hx of former smoker. Hx of pulmonary nodules. CT chest on 2/108 was unremarkable overall - mammogram in 11/2016 which was normal, pap smear 05/2015 with normal cytology.  - colonoscopy: [facility]: last colonoscopy was in 2015 and per patient was normal.   - weight today was 152lb. Was 133lb on 10/2016 and 148lb on 10/2016.  - patient wondering if weight is due to fluid. Reports of lower extremity swelling but improves with elevation and compression stockings. No worsening shortness of breath or orthopnea.   ROS: See HPI.  [place]:  PMH: Congenital Absence of one Kidney Psoriasis  HTN OSA GERD Thyroid Nodule Hx Basal Cell Carcinoma  Osteoporosis Anxiety and Depression    PHYSICAL EXAM: BP 118/70   Pulse 95   Temp 98.3 F (36.8 C) (Oral)   Ht 4\' 11"  (1.499 m)   Wt 152 lb (68.9 kg)   SpO2 98%   BMI 30.70 kg/m  GEN: NAD CV: RRR, no murmurs, rubs, or gallops PULM: CTAB, normal effort ABD: Soft, nontender, nondistended, NABS, no organomegaly SKIN: No rash or cyanosis; warm and well-perfused EXTR: non-pitting edema bilaterally with skin changes consistent with venous stasis. DP pulses and PT pulses intact bilaterally.  PSYCH: Mood and affect euthymic, normal rate and volume of speech NEURO: Awake, alert, no focal deficits grossly, normal  speech   ASSESSMENT/PLAN:  Loss of weight Weight today was 152lb. Was 133lb on 10/2016 and 148lb on 10/2016. I believe that 133lb was likely a measurement error. She does not look fluid overloaded on exam. Follow up with PCP in about 3 weeks for continued monitoring.    Smiley Houseman, MD PGY [place]

## 2016-11-27 NOTE — Assessment & Plan Note (Signed)
Weight today was 152lb. Was 133lb on 10/2016 and 148lb on 10/2016. I believe that 133lb was likely a measurement error. She does not look fluid overloaded on exam. Follow up with PCP in about 3 weeks for continued monitoring.

## 2016-11-27 NOTE — Patient Instructions (Addendum)
Thank you for coming! Your weight looks fiine today  Follow up in 3 weeks

## 2016-11-28 ENCOUNTER — Ambulatory Visit: Payer: Self-pay | Admitting: Family Medicine

## 2016-11-29 DIAGNOSIS — F3341 Major depressive disorder, recurrent, in partial remission: Secondary | ICD-10-CM | POA: Diagnosis not present

## 2016-12-01 ENCOUNTER — Institutional Professional Consult (permissible substitution): Payer: PPO | Admitting: Pulmonary Disease

## 2016-12-06 DIAGNOSIS — F3341 Major depressive disorder, recurrent, in partial remission: Secondary | ICD-10-CM | POA: Diagnosis not present

## 2016-12-08 ENCOUNTER — Other Ambulatory Visit: Payer: Self-pay | Admitting: Family Medicine

## 2016-12-11 ENCOUNTER — Other Ambulatory Visit: Payer: Self-pay | Admitting: Family Medicine

## 2016-12-13 DIAGNOSIS — F3341 Major depressive disorder, recurrent, in partial remission: Secondary | ICD-10-CM | POA: Diagnosis not present

## 2016-12-18 ENCOUNTER — Ambulatory Visit (HOSPITAL_COMMUNITY)
Admission: RE | Admit: 2016-12-18 | Discharge: 2016-12-18 | Disposition: A | Discharge status: Home / Self Care | Payer: PPO | Source: Ambulatory Visit | Attending: Cardiovascular Disease | Admitting: Cardiovascular Disease

## 2016-12-18 ENCOUNTER — Ambulatory Visit (HOSPITAL_COMMUNITY)
Admission: RE | Admit: 2016-12-18 | Discharge: 2016-12-18 | Disposition: A | Discharge status: Home / Self Care | Payer: PPO | Source: Ambulatory Visit | Attending: Cardiology | Admitting: Cardiology

## 2016-12-18 DIAGNOSIS — Z87891 Personal history of nicotine dependence: Secondary | ICD-10-CM | POA: Diagnosis not present

## 2016-12-18 DIAGNOSIS — I10 Essential (primary) hypertension: Secondary | ICD-10-CM | POA: Diagnosis not present

## 2016-12-18 DIAGNOSIS — E785 Hyperlipidemia, unspecified: Secondary | ICD-10-CM | POA: Diagnosis not present

## 2016-12-18 DIAGNOSIS — I708 Atherosclerosis of other arteries: Secondary | ICD-10-CM | POA: Diagnosis not present

## 2016-12-18 DIAGNOSIS — I739 Peripheral vascular disease, unspecified: Secondary | ICD-10-CM

## 2016-12-18 DIAGNOSIS — I700 Atherosclerosis of aorta: Secondary | ICD-10-CM | POA: Diagnosis not present

## 2016-12-19 ENCOUNTER — Encounter: Payer: Self-pay | Admitting: Family Medicine

## 2016-12-19 ENCOUNTER — Ambulatory Visit (INDEPENDENT_AMBULATORY_CARE_PROVIDER_SITE_OTHER): Payer: PPO | Admitting: Family Medicine

## 2016-12-19 DIAGNOSIS — D649 Anemia, unspecified: Secondary | ICD-10-CM | POA: Insufficient documentation

## 2016-12-19 DIAGNOSIS — D539 Nutritional anemia, unspecified: Secondary | ICD-10-CM | POA: Insufficient documentation

## 2016-12-19 DIAGNOSIS — F332 Major depressive disorder, recurrent severe without psychotic features: Secondary | ICD-10-CM

## 2016-12-19 DIAGNOSIS — C44311 Basal cell carcinoma of skin of nose: Secondary | ICD-10-CM | POA: Diagnosis not present

## 2016-12-19 DIAGNOSIS — L570 Actinic keratosis: Secondary | ICD-10-CM | POA: Diagnosis not present

## 2016-12-19 MED ORDER — POLYSACCHARIDE IRON COMPLEX 150 MG PO CAPS: 150.0000 mg | ORAL_CAPSULE | Freq: Every day | ORAL | 1 refills | Status: DC

## 2016-12-19 NOTE — Assessment & Plan Note (Signed)
Patient has been taking oral iron supplementation for anemia over the past few months. She states that she feels well and has no nausea/abdominal discomfort with this medication. She has not had any constipation of late. - Continue iron supplements - Repeat CBC in 4-6 months.

## 2016-12-19 NOTE — Patient Instructions (Signed)
It was a pleasure seeing you today in our clinic. Today we discussed your medications. Here is the treatment plan we have discussed and agreed upon together:   - Continue taking your medications as prescribed. I would like for you to continue taking the iron supplements as well. If you develop any nausea, upset stomach, or constipation while taking this medication it is okay to discontinue its use, but I would like for you to let me know ahead of time. - We should get some blood tests on you in 4-6 months.

## 2016-12-19 NOTE — Assessment & Plan Note (Signed)
Patient states that she feels well overall. No acute changes recently. Her psychiatrist has discontinued some of her medications recently and she continues to feel well. Continues to find enjoyment with coloring. No SI/HI. - Continue current medication regimen.

## 2016-12-19 NOTE — Progress Notes (Signed)
   HPI  CC: Medication follow-up Patient is here for medication follow-up. She states she feels well overall and has not had any setbacks since our last visit. Regarding her depression, she states that she feels well. She continues to see her therapist twice a week. Her psychiatrist has changed some of her medications recently and she is now only taking Celexa and Lamictal. She continues to enjoy her new hobby of coloring/drawing. She denies any SI/HI. She denies any feelings of hopelessness.  Review of Systems See HPI for ROS.   CC, SH/smoking status, and VS noted  Objective: BP 108/60   Pulse 86   Temp 97.8 F (36.6 C) (Oral)   Ht 4\' 11"  (1.499 m)   Wt 148 lb (67.1 kg)   SpO2 92%   BMI 29.89 kg/m  Gen: NAD, alert, cooperative, and pleasant. CV: RRR, no murmur Resp: CTAB, no wheezes, non-labored Ext: No edema, warm Neuro: Alert and oriented, Speech clear, No gross deficits   Assessment and plan:  Major depressive disorder, recurrent severe without psychotic features [facility]) Patient states that she feels well overall. No acute changes recently. Her psychiatrist has discontinued some of her medications recently and she continues to feel well. Continues to find enjoyment with coloring. No SI/HI. - Continue current medication regimen.  Anemia Patient has been taking oral iron supplementation for anemia over the past few months. She states that she feels well and has no nausea/abdominal discomfort with this medication. She has not had any constipation of late. - Continue iron supplements - Repeat CBC in 4-6 months.   Meds ordered this encounter  Medications  . cyclobenzaprine (FLEXERIL) 10 MG tablet    Sig: Take 10 mg by mouth 3 (three) times daily as needed for muscle spasms.  . iron polysaccharides (FERREX 150) 150 MG capsule    Sig: Take 1 capsule (150 mg total) by mouth at bedtime.    Dispense:  90 capsule    Refill:  1    Please consider 90 day supplies to promote  better adherence     Elberta Leatherwood, MD,MS,  PGY3 12/19/2016 5:30 PM

## 2016-12-20 DIAGNOSIS — F3341 Major depressive disorder, recurrent, in partial remission: Secondary | ICD-10-CM | POA: Diagnosis not present

## 2016-12-21 ENCOUNTER — Other Ambulatory Visit: Payer: Self-pay | Admitting: *Deleted

## 2016-12-22 NOTE — Telephone Encounter (Signed)
Pt needs refills on tramadol and iron. Pt uses [facility] on [address]. ep

## 2016-12-26 DIAGNOSIS — F3341 Major depressive disorder, recurrent, in partial remission: Secondary | ICD-10-CM | POA: Diagnosis not present

## 2016-12-27 DIAGNOSIS — F3341 Major depressive disorder, recurrent, in partial remission: Secondary | ICD-10-CM | POA: Diagnosis not present

## 2017-01-02 ENCOUNTER — Other Ambulatory Visit: Payer: Self-pay

## 2017-01-02 DIAGNOSIS — I739 Peripheral vascular disease, unspecified: Secondary | ICD-10-CM

## 2017-01-03 DIAGNOSIS — F3341 Major depressive disorder, recurrent, in partial remission: Secondary | ICD-10-CM | POA: Diagnosis not present

## 2017-01-09 ENCOUNTER — Ambulatory Visit (INDEPENDENT_AMBULATORY_CARE_PROVIDER_SITE_OTHER): Payer: PPO | Admitting: Obstetrics and Gynecology

## 2017-01-09 ENCOUNTER — Encounter: Payer: Self-pay | Admitting: Obstetrics and Gynecology

## 2017-01-09 VITALS — BP 100/58 | HR 82 | Temp 98.3°F | Wt 149.0 lb

## 2017-01-09 DIAGNOSIS — I10 Essential (primary) hypertension: Secondary | ICD-10-CM

## 2017-01-09 DIAGNOSIS — R197 Diarrhea, unspecified: Secondary | ICD-10-CM

## 2017-01-09 MED ORDER — LISINOPRIL 2.5 MG PO TABS: 2.5000 mg | ORAL_TABLET | Freq: Every day | ORAL | 0 refills | Status: DC

## 2017-01-09 MED ORDER — LOPERAMIDE HCL 2 MG PO CAPS: 2.0000 mg | ORAL_CAPSULE | ORAL | 0 refills | Status: DC | PRN

## 2017-01-09 NOTE — Patient Instructions (Addendum)
Changing BP medications around. Please see medication list attached  - Stopping Lasix  - Metoprolol we are going to decrease weekly. Continue BID for this week, Next week take once daily, the week after discontinue. - Continue other medications  Stay hydrated in setting of diarrhea Sent in medication to help with diarrhea   Follow-up with PCP if BPs not improved

## 2017-01-09 NOTE — Progress Notes (Signed)
   Subjective:   Patient ID: [name] [name], female    DOB: 1941/06/12, 76 y.o.   MRN: [id number]  Patient presents for Same Day Appointment  Chief Complaint  Patient presents with  . Diarrhea    HPI: # Diarrhea Patient complains of diarrhea. Onset of diarrhea was 2 weeks ago. Diarrhea is occurring approximately 5 times per day. Patient describes diarrhea as watery. Diarrhea has been associated with unknown. Patient denies blood in stool, fever, illness in household contacts, recent antibiotic use, recent travel, significant abdominal pain, unintentional weight loss. Previous visits for diarrhea: none. Evaluation to date: none. Treatment to date: none.  Also patient concerned about low BPs. She checks her Bps daily with home monitor. BP has been as low as 90s/50s. At last visit with PCP patient states they were considering stopping some of her BP medications but none were stopped. She is taking all 4 of her BP medications.  When her blood pressure gets low she gets weak, tired, and dizzy. Has not had any falls.  Review of Systems   See HPI for ROS.   History  Smoking Status  . Former Smoker  . Packs/day: 1.00  . Years: 40.00  . Types: Cigarettes  . Quit date: 07/24/2010  Smokeless Tobacco  . Never Used    Comment: STARTED BACK JULY 2013 AND RECENTLY QUIT 03-10-2012    Past medical history, surgical, family, and social history reviewed and updated in the EMR as appropriate.  Pertinent Historical Findings include: HTN, PVD Objective:  BP (!) 100/58   Pulse 82   Temp 98.3 F (36.8 C) (Oral)   Wt 149 lb (67.6 kg)   SpO2 98%   BMI 30.09 kg/m  Vitals and nursing note reviewed  Physical Exam  Assessment & Plan:  1. Diarrhea, unspecified type Does not appear to be an infectious source. Do not think it is due to mediations as patient has not been on any new medications or antibiotics. Most likely has a viral illness such as gastroenteritis causing diarrhea. Weight is stable. Will  obtain blood work to check electrolytes and white count. Rx given for imodium to help with symptoms. Patient encouraged to stay hydrated. Follow-up if symptoms not improving in a week or if worsening.  - CBC - Basic Metabolic Panel  2. Essential hypertension Patient with hypotension over the last month. Will decrease her BP regimen. Discontinue Lasix. Will discontinue metoprolol in a stepdown tapered fashion. See AVS for instructions given to patient. Reduce lisinopril dose to 2.5mg  daily. Will keep on for renal protection in patient with solitary kidney. Continue chlorthalidone. Patient encouraged to continue monitoring BP and report any changes to PCP. Follow-up in 2-3 weeks.   PATIENT EDUCATION PROVIDED: See AVS   Luiz Blare, DO 01/09/2017, 2:18 PM

## 2017-01-09 NOTE — Assessment & Plan Note (Signed)
Patient with hypotension over the last month. Will decrease her BP regimen. Discontinue Lasix. Will discontinue metoprolol in a stepdown tapered fashion. See AVS for instructions given to patient. Reduce lisinopril dose to 2.5mg  daily. Will keep on for renal protection in patient with solitary kidney. Continue chlorthalidone. Patient encouraged to continue monitoring BP and report any changes to PCP. Follow-up in 2-3 weeks.

## 2017-01-09 NOTE — Progress Notes (Signed)
Bp getting low 96/52 Check BP at home Can tell when gets low - tired, weak, feels faint

## 2017-01-10 LAB — BASIC METABOLIC PANEL
BUN/Creatinine Ratio: 24 (ref 12–28)
BUN: 58 mg/dL — ABNORMAL HIGH (ref 8–27)
CO2: 16 mmol/L — ABNORMAL LOW (ref 20–29)
Calcium: 9.1 mg/dL (ref 8.7–10.3)
Chloride: 102 mmol/L (ref 96–106)
Creatinine, Ser: 2.46 mg/dL — ABNORMAL HIGH (ref 0.57–1.00)
GFR calc Af Amer: 21 mL/min/{1.73_m2} — ABNORMAL LOW (ref >59)
GFR calc non Af Amer: 18 mL/min/{1.73_m2} — ABNORMAL LOW (ref >59)
Glucose: 80 mg/dL (ref 65–99)
Potassium: 4.9 mmol/L (ref 3.5–5.2)
Sodium: 135 mmol/L (ref 134–144)

## 2017-01-10 LAB — CBC
Hematocrit: 33.8 % — ABNORMAL LOW (ref 34.0–46.6)
Hemoglobin: 11.1 g/dL (ref 11.1–15.9)
MCH: 31.9 pg (ref 26.6–33.0)
MCHC: 32.8 g/dL (ref 31.5–35.7)
MCV: 97 fL (ref 79–97)
Platelets: 192 10*3/uL (ref 150–379)
RBC: 3.48 x10E6/uL — ABNORMAL LOW (ref 3.77–5.28)
RDW: 14.2 % (ref 12.3–15.4)
WBC: 6.4 10*3/uL (ref 3.4–10.8)

## 2017-01-12 ENCOUNTER — Encounter: Payer: Self-pay | Admitting: Family Medicine

## 2017-01-15 ENCOUNTER — Telehealth: Payer: Self-pay | Admitting: Family Medicine

## 2017-01-15 NOTE — Telephone Encounter (Signed)
Pt has still has diarrhea.  She is still taking the medicine. She is getting very weak.  Please advise

## 2017-01-16 NOTE — Telephone Encounter (Signed)
Spoke with patient, she states that diarrhea is starting to get better. Informed her that PCP would still like for her to be seen for this and to discuss skin biopsy results. Appointment scheduled for 7/17 with PCP. Patient advised to call back for an earlier appointment if diarrhea continued, patient expressed understanding.

## 2017-01-17 DIAGNOSIS — F3341 Major depressive disorder, recurrent, in partial remission: Secondary | ICD-10-CM | POA: Diagnosis not present

## 2017-01-23 ENCOUNTER — Encounter: Payer: Self-pay | Admitting: Family Medicine

## 2017-01-23 ENCOUNTER — Ambulatory Visit (INDEPENDENT_AMBULATORY_CARE_PROVIDER_SITE_OTHER): Payer: PPO | Admitting: Family Medicine

## 2017-01-23 VITALS — BP 82/58 | HR 91 | Temp 97.9°F | Ht 59.0 in | Wt 149.6 lb

## 2017-01-23 DIAGNOSIS — I959 Hypotension, unspecified: Secondary | ICD-10-CM | POA: Diagnosis not present

## 2017-01-23 DIAGNOSIS — N179 Acute kidney failure, unspecified: Secondary | ICD-10-CM | POA: Diagnosis not present

## 2017-01-23 DIAGNOSIS — M25561 Pain in right knee: Secondary | ICD-10-CM | POA: Diagnosis not present

## 2017-01-23 DIAGNOSIS — R197 Diarrhea, unspecified: Secondary | ICD-10-CM

## 2017-01-23 DIAGNOSIS — G8929 Other chronic pain: Secondary | ICD-10-CM

## 2017-01-23 MED ORDER — LOPERAMIDE HCL 2 MG PO CAPS: 4.0000 mg | ORAL_CAPSULE | ORAL | 0 refills | Status: DC | PRN

## 2017-01-23 NOTE — Patient Instructions (Addendum)
You are being discharged with changes to your medication. You are to stop taking the chlorthalidone, and lisinopril. Continue taking other medications as prescribed at last clinic appointment on 7/3. You dose of immodium is being increased to 4mg  twice per day Please resume all activity prior to clinic visit You are getting blood work today to test your kidney function, we will call you with those results. You are getting a test for c. Diff, we will update you with these results. You will receive education on how to obtain this sample.  Please follow up in clinic in 1-2 weeks

## 2017-01-25 DIAGNOSIS — N179 Acute kidney failure, unspecified: Secondary | ICD-10-CM | POA: Insufficient documentation

## 2017-01-25 DIAGNOSIS — I959 Hypotension, unspecified: Secondary | ICD-10-CM | POA: Insufficient documentation

## 2017-01-25 LAB — BASIC METABOLIC PANEL
BUN/Creatinine Ratio: 16 (ref 12–28)
BUN: 40 mg/dL — ABNORMAL HIGH (ref 8–27)
CO2: 21 mmol/L (ref 20–29)
Calcium: 9.1 mg/dL (ref 8.7–10.3)
Chloride: 104 mmol/L (ref 96–106)
Creatinine, Ser: 2.46 mg/dL — ABNORMAL HIGH (ref 0.57–1.00)
GFR calc Af Amer: 21 mL/min/{1.73_m2} — ABNORMAL LOW (ref >59)
GFR calc non Af Amer: 18 mL/min/{1.73_m2} — ABNORMAL LOW (ref >59)
Glucose: 96 mg/dL (ref 65–99)
Potassium: 4.5 mmol/L (ref 3.5–5.2)
Sodium: 143 mmol/L (ref 134–144)

## 2017-01-25 NOTE — Assessment & Plan Note (Signed)
Patient with multi-month history of progressive diarrhea. PMH significant for C. Diff, and increasing vagal response. Concerned that patient is getting dehydrated, which is decreasing her blood pressure, and causing creatinine increase. Was started on ferrex in 10/2016, and started on SSRI in 08/2016. Both of these medications have risk of chronic diarrhea. Given her great response to SSRI treatment I am inclined to continue this medication unless absolutely necessary. Will stop ferrex as hgb >11 on last check in early July. Started on immondium 200mg  TID at last clinic visit with small amount of relief. Will plan to increase this dose to 400mg  TID. Will get C. Diff culture given present history of symptoms and PMH of C.Diff infection requiring fecal transplant. - Stop Ferrex - Increase Immodium to 400mg  TID - Will consider stopping citalopram in future - Will need to see and re-evaluate in 1-2 weeks - C. Diff PCR

## 2017-01-25 NOTE — Assessment & Plan Note (Addendum)
Patient with baseline creatinine of ~1.2 per chart review. Found to have creatinine of ~2.5 on bmp on 01/09/2017. Elevation in creatinine thought to be due to combination of anti-hypertensives and dehydration from her chronic diarrhea. Will make changes to hypertensive medications as listed above. Will draw BMP today to evaluated creatinine. Will need close follow up. - Stopping lisinopril - Stopping chlorthalidone - BMP at today's visit - Clinic follow up in 1-2 weeks

## 2017-01-25 NOTE — Assessment & Plan Note (Signed)
Patient with Right leg pain, slightly inferior to knee that started around 4 weeks ago. She feels like it is slowly getting worse. Small non-fluctuant fluid palpated in medial portion of proximal right leg. Will watch for now as it does not seem to be impairing ADLs in any way. Will plan to re-evaluate in 1-2 weeks when patient returns to clinic.

## 2017-01-25 NOTE — Progress Notes (Signed)
HPI 76 y/o with PMH of HTN, PVD, GERD, basal Cell Carcinoma, HLD, stasis dermatitis, and anxiety/depression who presents with 4 month history of watery, foul-smelling diarrhea. She states that initially she just noticed that she would occasionally have a foul smelling bout of diarrhea infrequently, but that it has slowly progressed in frequency over the last four months. She states that whenever she has one of these bowel movements she will develop cramping abdominal pain, become sweaty/clammy and have to literally run to the restroom. She states that sometimes she does not even make it to the restroom. She states that in January of this year she had "a break" and had to stay in a mental hospital for around 30 days. Shortly after getting out of the hospital she developed a UTI, this was treated with antibiotics although she is not sure which ones.She was also started on citalopram at around this time. 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  She was last  Seen on 01/09/2017 in our clinic and thought to have a viral gastroenteritis. At that time she was not completely truthful about how long this had been going on. Patient was started on immodium and labs were drawn at that visit. She was found to be hypotensive into low 80s at that time. Her lasix were discontinued, metoprolol decreased, and lisinopril was decreased to 2.5mg  daily. She was then told to follow up. On presentation to clinic she was hypotensive to the high 51O systolic, and her diarrhea has not improved. The immodium initially helped control the diarrhea somewhat, but patient still endorses same symptoms. A bmp was drawn on 7/3 which demonstrated creatinine bump from 1.24 (4/10) to 2.46 (7/3). Hgb has increased in that time span from 10.7 to 11.1.   Patient also endorsing knot in her right leg that started about 4 weeks ago. It is located on her medial aspect of her proximal shin. No associated  erythema, slight pain when ambulating that she was concerned about.    CC: "Diarrhea and Leg Knot"   ROS: gen: (-) f/c/s, (-) weight loss Pulm: (-) sob/chest pain/ increased work of breathing Cardio: (-) chest pain, syncope Gi: (+) diarreha, (-) constipation, abdominal pain Msk: (+) joint pain (R knee), (-) weakness Neuro: (-) seizure, neuro deficit  Review of Systems See HPI for ROS.   CC, SH/smoking status, and VS noted  Objective: BP (!) 82/58   Pulse 91   Temp 97.9 F (36.6 C) (Oral)   Ht 4\' 11"  (1.499 m)   Wt 149 lb 9.6 oz (67.9 kg)   SpO2 98%   BMI 30.22 kg/m  Gen: NAD, alert, cooperative, and pleasant. AOx4 HEENT: NCAT, EOMI, PERRL CV: RRR, no murmur, gallops, or rubs appreciated Resp: CTAB, no wheezes, non-labored Abd: SNTND, BS present, no guarding or organomegaly Ext: No edema, warm. Able to move all four extremities. 5/5 strength BLE/BUE in all muscle groups. Small effusion palpated on medial portion of proximal right leg. Neuro: Alert and oriented, Speech clear, No gross deficits  Assessment and plan:  Diarrhea Patient with multi-month history of progressive diarrhea. PMH significant for C. Diff, and increasing vagal response. Concerned that patient is getting dehydrated, which is decreasing her blood pressure, and causing creatinine increase. Was started on ferrex in 10/2016, and started on SSRI in 08/2016. Both of these medications have risk of chronic diarrhea. Given her great response to SSRI treatment I am inclined to continue this medication unless absolutely  necessary. Will stop ferrex as hgb >11 on last check in early July. Started on immondium 200mg  TID at last clinic visit with small amount of relief. Will plan to increase this dose to 400mg  TID. Will get C. Diff culture given present history of symptoms and PMH of C.Diff infection requiring fecal transplant. - Stop Ferrex - Increase Immodium to 400mg  TID - Will consider stopping citalopram in future -  Will need to see and re-evaluate in 1-2 weeks - C. Diff PCR  Hypotension Patient with hypotension at last two clinic visits. In low 32G systolic at 7/3 clinic visin and in high 40N systolic today. I believe this is due to a combination of her medical therapy for hypertension, and dehydration from her chronic diarrhea. Per the note from last clinic visit her lasix were stopped., metoprolol dose dropped, and lisinopril decreased. Patient with lasix in pill box today. Will plan to encourage PO hydration. Will stop lisinopril and chlothalidone today. Encouraged patient to dispose of lasix. Will need to continue active checking of bp at home. - Stopping lisinopril - Stopping chlorthalidone - Continue metoprolo 12.5mg  bid - Patient encouraged to keep close eye on bp at home  Acute renal failure [facility]) Patient with baseline creatinine of ~1.2 per chart review. Found to have creatinine of ~2.5 on bmp on 01/09/2017. Elevation in creatinine thought to be due to combination of anti-hypertensives and dehydration from her chronic diarrhea. Will make changes to hypertensive medications as listed above. Will draw BMP today to evaluated creatinine. Will need close follow up. - Stopping lisinopril - Stopping chlorthalidone - BMP at today's visit - Clinic follow up in 1-2 weeks   Right knee pain Patient with Right leg pain, slightly inferior to knee that started around 4 weeks ago. She feels like it is slowly getting worse. Small non-fluctuant fluid palpated in medial portion of proximal right leg. Will watch for now as it does not seem to be impairing ADLs in any way. Will plan to re-evaluate in 1-2 weeks when patient returns to clinic.   Orders Placed This Encounter  Procedures  . Clostridium Difficile by PCR    Order Specific Question:   Is your patient experiencing loose or watery stools (3 or more in 24 hours)?    Answer:   Yes    Order Specific Question:   Has the patient received laxatives in the last  24 hours?    Answer:   No    Order Specific Question:   Has a negative Cdiff test resulted in the last 7 days?    Answer:   No  . Basic Metabolic Panel    Meds ordered this encounter  Medications  . DISCONTD: loperamide (IMODIUM) 2 MG capsule    Sig: Take 2 capsules (4 mg total) by mouth as needed for diarrhea or loose stools.    Dispense:  30 capsule    Refill:  0  . loperamide (IMODIUM) 2 MG capsule    Sig: Take 2 capsules (4 mg total) by mouth as needed for diarrhea or loose stools.    Dispense:  30 capsule    Refill:  0    [name] MD PGY-1 Family Medicine Resident 01/25/2017 10:57 PM

## 2017-01-25 NOTE — Assessment & Plan Note (Signed)
Patient with hypotension at last two clinic visits. In low 16X systolic at 7/3 clinic visin and in high 09U systolic today. I believe this is due to a combination of her medical therapy for hypertension, and dehydration from her chronic diarrhea. Per the note from last clinic visit her lasix were stopped., metoprolol dose dropped, and lisinopril decreased. Patient with lasix in pill box today. Will plan to encourage PO hydration. Will stop lisinopril and chlothalidone today. Encouraged patient to dispose of lasix. Will need to continue active checking of bp at home. - Stopping lisinopril - Stopping chlorthalidone - Continue metoprolo 12.5mg  bid - Patient encouraged to keep close eye on bp at home

## 2017-01-26 LAB — SPECIMEN STATUS REPORT

## 2017-01-26 LAB — CLOSTRIDIUM DIFFICILE BY PCR

## 2017-01-30 DIAGNOSIS — L570 Actinic keratosis: Secondary | ICD-10-CM | POA: Diagnosis not present

## 2017-01-30 DIAGNOSIS — C44329 Squamous cell carcinoma of skin of other parts of face: Secondary | ICD-10-CM | POA: Diagnosis not present

## 2017-01-31 ENCOUNTER — Other Ambulatory Visit: Payer: Self-pay | Admitting: Family Medicine

## 2017-02-07 DIAGNOSIS — F3341 Major depressive disorder, recurrent, in partial remission: Secondary | ICD-10-CM | POA: Diagnosis not present

## 2017-02-09 ENCOUNTER — Encounter: Payer: Self-pay | Admitting: Family Medicine

## 2017-02-09 ENCOUNTER — Ambulatory Visit (INDEPENDENT_AMBULATORY_CARE_PROVIDER_SITE_OTHER): Payer: PPO | Admitting: Family Medicine

## 2017-02-09 VITALS — BP 128/80 | HR 88 | Temp 97.6°F | Wt 150.0 lb

## 2017-02-09 DIAGNOSIS — I959 Hypotension, unspecified: Secondary | ICD-10-CM | POA: Diagnosis not present

## 2017-02-09 DIAGNOSIS — M25561 Pain in right knee: Secondary | ICD-10-CM | POA: Diagnosis not present

## 2017-02-09 DIAGNOSIS — R197 Diarrhea, unspecified: Secondary | ICD-10-CM

## 2017-02-09 DIAGNOSIS — N179 Acute kidney failure, unspecified: Secondary | ICD-10-CM | POA: Diagnosis not present

## 2017-02-09 NOTE — Patient Instructions (Signed)
Today we had a follow up visit discussing your diarrhea, blood pressure, right leg pain, and kidney injury from the last visit 2 weeks ago  We are making no changes to your medication regimen at this time Please follow up with [place] 2-3 week after your surgery in late august, and after your visit at the lipid clinic We are drawing a kidney panel today, we will call you with these results when we get them

## 2017-02-10 LAB — BASIC METABOLIC PANEL
BUN/Creatinine Ratio: 17 (ref 12–28)
BUN: 24 mg/dL (ref 8–27)
CO2: 22 mmol/L (ref 20–29)
Calcium: 9.7 mg/dL (ref 8.7–10.3)
Chloride: 101 mmol/L (ref 96–106)
Creatinine, Ser: 1.44 mg/dL — ABNORMAL HIGH (ref 0.57–1.00)
GFR calc Af Amer: 40 mL/min/{1.73_m2} — ABNORMAL LOW (ref >59)
GFR calc non Af Amer: 35 mL/min/{1.73_m2} — ABNORMAL LOW (ref >59)
Glucose: 76 mg/dL (ref 65–99)
Potassium: 4.4 mmol/L (ref 3.5–5.2)
Sodium: 142 mmol/L (ref 134–144)

## 2017-02-14 ENCOUNTER — Telehealth: Payer: Self-pay | Admitting: Family Medicine

## 2017-02-14 DIAGNOSIS — F3341 Major depressive disorder, recurrent, in partial remission: Secondary | ICD-10-CM | POA: Diagnosis not present

## 2017-02-14 NOTE — Telephone Encounter (Signed)
Family Medicine Telephone Call  Called patient and attempted inform her of most recent bmp which was done at last clinic visit. Left message telling patient that her numbers looked much better and to please call with questions.  -Deirdre Peer

## 2017-02-19 NOTE — Assessment & Plan Note (Signed)
Patient's pressures are much better than at last clinic visit. She has not noticed any hypotension on self recording at home. Will plan to continue current regimen - metoprolol 12.5 bid

## 2017-02-19 NOTE — Assessment & Plan Note (Signed)
Expect for acute kidney injury to have resolved given resolution of diarrhea, improvement of blood pressure, and discontinuation of nephrotoxic medications. Will draw bmp at this clinic visit to check. If elevated creatinine persists will consider having patient follow up again in 2 weeks. - Draw BMP - pending creatinine will have patient follow up in 2 weeks

## 2017-02-19 NOTE — Assessment & Plan Note (Signed)
Patient's diarrhea has seemingly resolved. Her pressures are much better today and she has not been complaining of diarrhea for the last couple of weeks. Will plan to continue immodium and have instructed her to call if she becomes constipated. -Continue immodium TID 400mg 

## 2017-02-19 NOTE — Assessment & Plan Note (Signed)
Patient's right leg pain has resolved. She says that she still feels as though their might be fluid in her leg but feels like it is not bothering her anymore. - No treatment indicated at this time - Patient to follow up in case of return of pain

## 2017-02-19 NOTE — Progress Notes (Signed)
   HPI Patient last seen at our clinic on 7/17 for the follow issues, diarrhea, hypotension, acute renal failure, right knee pain. She had been having very profound diarrhea for the past 2 months prior to that visit. A combination of the dehydration from her diarrhea and her blood pressure medications had presumedly caused her to have an acute kidney injury. At that visit her ferrex was stopped, her immodium was increased to 400mg  TID, and a C Diff was drawn. She was also stopped of lisinopril and chlorthalidone.  She presents today feeling much better than at her previous clinic visit. She says that her diarrhea has completely stopped and that she has been having solid bowel movements for the last couple of weeks. She states that she has also noticed that her blood pressure has been consistently in 563-149 systolic. Her leg knot has seemingly disappeared and it has not been bothering since last clinic visit. She continues to follow up with [facility] [place] for management of squamous cell, and basal cell skin masses.  CC: Follow up previous clinic visit   ROS: Review of Systems  Constitutional: Negative for chills and fever.  Eyes: Negative for pain, discharge and redness.  Respiratory: Negative for cough.   Cardiovascular: Negative for chest pain, palpitations and leg swelling.  Gastrointestinal: Negative for constipation, diarrhea, nausea and vomiting.  Genitourinary: Negative for dysuria and urgency.  Musculoskeletal: Positive for back pain and neck pain. Negative for myalgias.  Neurological: Negative for dizziness and headaches.    Review of Systems See HPI for ROS.   CC, SH/smoking status, and VS noted  Objective: BP 128/80   Pulse 88   Temp 97.6 F (36.4 C) (Oral)   Wt 150 lb (68 kg)   BMI 30.30 kg/m  Gen: NAD, alert, cooperative, and pleasant. HEENT: NCAT, EOMI, PERRL CV: RRR, no murmur, stasis dermatitis in legs bilaterally Resp: CTAB, no wheezes,  non-labored Abd: SNTND, BS present, no guarding or organomegaly Ext: No edema, warm Neuro: Alert and oriented, Speech clear, No gross deficits  Assessment and plan:  Diarrhea Patient's diarrhea has seemingly resolved. Her pressures are much better today and she has not been complaining of diarrhea for the last couple of weeks. Will plan to continue immodium and have instructed her to call if she becomes constipated. -Continue immodium TID 400mg   Hypotension Patient's pressures are much better than at last clinic visit. She has not noticed any hypotension on self recording at home. Will plan to continue current regimen - metoprolol 12.5 bid  Acute renal failure ([place]) Expect for acute kidney injury to have resolved given resolution of diarrhea, improvement of blood pressure, and discontinuation of nephrotoxic medications. Will draw bmp at this clinic visit to check. If elevated creatinine persists will consider having patient follow up again in 2 weeks. - Draw BMP - pending creatinine will have patient follow up in 2 weeks  Right knee pain Patient's right leg pain has resolved. She says that she still feels as though their might be fluid in her leg but feels like it is not bothering her anymore. - No treatment indicated at this time - Patient to follow up in case of return of pain   Orders Placed This Encounter  Procedures  . Basic Metabolic Panel    No orders of the defined types were placed in this encounter.    [name] MD PGY-1 Family Medicine Resident 02/19/2017 8:45 AM

## 2017-02-21 DIAGNOSIS — M25532 Pain in left wrist: Secondary | ICD-10-CM | POA: Diagnosis not present

## 2017-02-21 DIAGNOSIS — M79632 Pain in left forearm: Secondary | ICD-10-CM | POA: Diagnosis not present

## 2017-02-21 DIAGNOSIS — M79602 Pain in left arm: Secondary | ICD-10-CM | POA: Diagnosis not present

## 2017-02-21 DIAGNOSIS — L03114 Cellulitis of left upper limb: Secondary | ICD-10-CM | POA: Diagnosis not present

## 2017-02-21 DIAGNOSIS — M25522 Pain in left elbow: Secondary | ICD-10-CM | POA: Diagnosis not present

## 2017-02-27 ENCOUNTER — Other Ambulatory Visit: Payer: Self-pay | Admitting: Family Medicine

## 2017-02-27 DIAGNOSIS — K219 Gastro-esophageal reflux disease without esophagitis: Secondary | ICD-10-CM

## 2017-02-28 DIAGNOSIS — C44311 Basal cell carcinoma of skin of nose: Secondary | ICD-10-CM | POA: Diagnosis not present

## 2017-03-05 DIAGNOSIS — C44320 Squamous cell carcinoma of skin of unspecified parts of face: Secondary | ICD-10-CM | POA: Diagnosis not present

## 2017-03-09 ENCOUNTER — Ambulatory Visit (INDEPENDENT_AMBULATORY_CARE_PROVIDER_SITE_OTHER): Payer: PPO | Admitting: Family Medicine

## 2017-03-09 ENCOUNTER — Encounter: Payer: Self-pay | Admitting: Family Medicine

## 2017-03-09 DIAGNOSIS — M25561 Pain in right knee: Secondary | ICD-10-CM | POA: Diagnosis not present

## 2017-03-09 DIAGNOSIS — R197 Diarrhea, unspecified: Secondary | ICD-10-CM

## 2017-03-09 DIAGNOSIS — Z23 Encounter for immunization: Secondary | ICD-10-CM

## 2017-03-09 DIAGNOSIS — I10 Essential (primary) hypertension: Secondary | ICD-10-CM | POA: Diagnosis not present

## 2017-03-09 DIAGNOSIS — M4722 Other spondylosis with radiculopathy, cervical region: Secondary | ICD-10-CM | POA: Diagnosis not present

## 2017-03-09 NOTE — Patient Instructions (Addendum)
Today we talked about your history of diarrhea, low blood pressure. We also discussed your leg swelling on your right leg and your referral for back injections.  We are making no changes to your medications today. Please continue your imodium and metoprolol for diarrhea and high blood pressure We talked about you making the appointment for your back injections. If you need an appointment please call the clinic and I can make that appointment for you. If you leg swelling starts to bother you please let us know and we can make an appointment for you in procedures clinic or you can make an appointment with me to have the fluid aspirated. You also received your flu shot today

## 2017-03-13 ENCOUNTER — Other Ambulatory Visit: Payer: Self-pay | Admitting: *Deleted

## 2017-03-13 NOTE — Telephone Encounter (Signed)
Patient left message on nurse line requesting refill on tramadol

## 2017-03-14 DIAGNOSIS — F3341 Major depressive disorder, recurrent, in partial remission: Secondary | ICD-10-CM | POA: Diagnosis not present

## 2017-03-14 MED ORDER — TRAMADOL HCL 50 MG PO TABS: 100.0000 mg | ORAL_TABLET | Freq: Two times a day (BID) | ORAL | 3 refills | Status: DC | PRN

## 2017-03-14 NOTE — Telephone Encounter (Signed)
2nd request for refill.  Jon Lall,CMA ? ?

## 2017-03-15 DIAGNOSIS — Z23 Encounter for immunization: Secondary | ICD-10-CM | POA: Insufficient documentation

## 2017-03-15 NOTE — Assessment & Plan Note (Signed)
Received flu vaccine today

## 2017-03-15 NOTE — Assessment & Plan Note (Signed)
Has resolved for around a month and a half. She has had no diarrhea since increasing her imodium from what she can remember. Have instructed her to call if she feels constipated. - Continue imodium TID 400mg 

## 2017-03-15 NOTE — Assessment & Plan Note (Signed)
Patient with pressure of 128/80 at clinic today. Stopped lisinopril and chlorthalidone 2 months ago 2/2 aki and hypotension. Believe that she has target pressure at this time and do not want to make any changes today. Will follow up at next clinic visit and consider restarting one of these meds if pressures are high at next clinic visit - Continue metoprolol 12.5mg  bid

## 2017-03-15 NOTE — Progress Notes (Signed)
HPI 76 year old who presents as a follow up from last clinic visit. In brief the patient had multi-month history of watery, profuse diarrhea when I first saw her in July. She was found to have acute kidney injury and hypotension at that time. She had her lisinopril and chlorthalidone stopped at that visit. She is currently only on metoprolol 12.5mg  bid. At her last visit on 8/3 her creatinine had normalized to 1.44, down from 2.46. Her hypotension had resolved and her diarrhea had completely resolved and she was having much better bowel movements. I asked her to follow up in 1 month for surveillance of these issues.  She presents today without complaints. She says that pressures have been around 696-295 systolic at every check. She has had no problems with diarrhea and has not been having any dizziness. She does feel like the swelling in her leg has increased from the last visit.   CC: Follow up for issues   ROS: Review of Systems  Constitutional: Negative for chills, fever and weight loss.  HENT: Negative for congestion and nosebleeds.   Eyes: Negative for pain, discharge and redness.  Respiratory: Negative for cough and hemoptysis.   Cardiovascular: Negative for chest pain and palpitations.  Gastrointestinal: Negative for abdominal pain, constipation, diarrhea and vomiting.  Genitourinary: Negative for flank pain, frequency and hematuria.  Musculoskeletal: Positive for back pain and joint pain. Negative for myalgias.  Skin: Negative for itching and rash.  Neurological: Negative for dizziness, weakness and headaches.  Psychiatric/Behavioral: Negative for depression.    Review of Systems See HPI for ROS.   CC, SH/smoking status, and VS noted  Objective: Temp 98.6 F (37 C) (Oral)   Ht 4\' 11"  (1.499 m)   Wt 67.1 kg (148 lb)   SpO2 99%   BMI 29.89 kg/m  Gen: NAD, alert, cooperative, and pleasant. HEENT: NCAT, EOMI, PERRL CV: RRR, no murmur, palpable radial pulses bilaterally.  Weakly palpable dp/pt bilaterally. Resp: CTAB, no wheezes, non-labored Abd: SNTND, BS present, no guarding or organomegaly Ext: No edema, warm. Venous stasis bilateral lower extremities. Neuro: Alert and oriented, Speech clear, No gross deficits   Assessment and plan:  Hypertension Patient with pressure of 128/80 at clinic today. Stopped lisinopril and chlorthalidone 2 months ago 2/2 aki and hypotension. Believe that she has target pressure at this time and do not want to make any changes today. Will follow up at next clinic visit and consider restarting one of these meds if pressures are high at next clinic visit - Continue metoprolol 12.5mg  bid  Diarrhea Has resolved for around a month and a half. She has had no diarrhea since increasing her imodium from what she can remember. Have instructed her to call if she feels constipated. - Continue imodium TID 400mg    Cervical spondylosis with radiculopathy Patient asking for referral back to specialist who originally did her epidural back injections. Cannot find this provider in epic and she is unsure of the name of this physician. She does have his number though. Have instructed her to try and schedule her own appointment and if she needs another referral then to let us know.  Need for immunization against influenza Received flu vaccine today  Right knee pain Patient with some increased fluid on her lower medial knee. Says that this is relieved some by propping feet up and ice/heat. It is not significantly affecting her life. I am inclined to continue to watch as opposed to performing procedure with risks to diagnose. Asked  her to keep an eye on it and let me know. Can possibly drain at a later date. - continue to monitor - Will consider drainage if presents more issues   Orders Placed This Encounter  Procedures  . Flu Vaccine QUAD 36+ mos IM    No orders of the defined types were placed in this encounter.    [name]  MD PGY-1 Family Medicine Resident 03/15/2017 6:13 PM

## 2017-03-15 NOTE — Assessment & Plan Note (Signed)
Patient asking for referral back to specialist who originally did her epidural back injections. Cannot find this provider in epic and she is unsure of the name of this physician. She does have his number though. Have instructed her to try and schedule her own appointment and if she needs another referral then to let us know.

## 2017-03-15 NOTE — Assessment & Plan Note (Addendum)
Patient with some increased fluid on her lower medial knee. Says that this is relieved some by propping feet up and ice/heat. It is not significantly affecting her life. I am inclined to continue to watch as opposed to performing procedure with risks to diagnose. Asked her to keep an eye on it and let me know. Can possibly drain at a later date. - continue to monitor - Will consider drainage if presents more issues

## 2017-03-16 ENCOUNTER — Other Ambulatory Visit: Payer: Self-pay | Admitting: *Deleted

## 2017-03-16 ENCOUNTER — Other Ambulatory Visit: Payer: Self-pay | Admitting: Family Medicine

## 2017-03-16 DIAGNOSIS — K219 Gastro-esophageal reflux disease without esophagitis: Secondary | ICD-10-CM

## 2017-03-16 MED ORDER — TRAMADOL HCL 50 MG PO TABS: 100.0000 mg | ORAL_TABLET | Freq: Two times a day (BID) | ORAL | 3 refills | Status: DC | PRN

## 2017-03-16 MED ORDER — PANTOPRAZOLE SODIUM 20 MG PO TBEC: 20.0000 mg | DELAYED_RELEASE_TABLET | Freq: Every day | ORAL | 1 refills | Status: DC

## 2017-03-16 MED ORDER — METOPROLOL TARTRATE 25 MG PO TABS: 12.5000 mg | ORAL_TABLET | Freq: Two times a day (BID) | ORAL | 11 refills | Status: DC

## 2017-03-16 NOTE — Telephone Encounter (Signed)
Pls phone in patients Tramadol Thanks LC

## 2017-03-16 NOTE — Telephone Encounter (Signed)
Rx called into pharmacy, patient informed.

## 2017-03-16 NOTE — Telephone Encounter (Signed)
Third request for tramadol. Hubbard Hartshorn, RN, BSN

## 2017-03-16 NOTE — Addendum Note (Signed)
Addended by: Talbert Cage L on: 03/16/2017 02:09 PM   Modules accepted: Orders

## 2017-03-16 NOTE — Telephone Encounter (Signed)
Family Medicine Prescription note  Unable to fill request for patient's metoprolol, tramadol, and pantroprazole. I have already approved these medications but as my (along with the rest of the interns') medicaid numbers are not working/existant at the current time. I cannot fill these medications. I was instructed to forward these messages to Dr. Erin Hearing for assistance.  Deirdre Peer

## 2017-03-17 ENCOUNTER — Telehealth: Payer: Self-pay | Admitting: *Deleted

## 2017-03-17 NOTE — Telephone Encounter (Signed)
Also needed pain diagnosis of last visit. I gave her Myalgia and Knee pain.CM will sign off for now but will be available should additional discharge needs arise or disposition change.

## 2017-03-21 DIAGNOSIS — F3341 Major depressive disorder, recurrent, in partial remission: Secondary | ICD-10-CM | POA: Diagnosis not present

## 2017-03-28 DIAGNOSIS — F3341 Major depressive disorder, recurrent, in partial remission: Secondary | ICD-10-CM | POA: Diagnosis not present

## 2017-04-04 DIAGNOSIS — F3341 Major depressive disorder, recurrent, in partial remission: Secondary | ICD-10-CM | POA: Diagnosis not present

## 2017-04-11 DIAGNOSIS — F3341 Major depressive disorder, recurrent, in partial remission: Secondary | ICD-10-CM | POA: Diagnosis not present

## 2017-04-12 ENCOUNTER — Other Ambulatory Visit: Payer: Self-pay | Admitting: Family Medicine

## 2017-04-18 DIAGNOSIS — F3341 Major depressive disorder, recurrent, in partial remission: Secondary | ICD-10-CM | POA: Diagnosis not present

## 2017-04-28 ENCOUNTER — Other Ambulatory Visit: Payer: Self-pay | Admitting: Family Medicine

## 2017-04-30 ENCOUNTER — Other Ambulatory Visit: Payer: Self-pay | Admitting: Family Medicine

## 2017-05-01 ENCOUNTER — Encounter: Payer: Self-pay | Admitting: Family Medicine

## 2017-05-02 ENCOUNTER — Other Ambulatory Visit: Payer: Self-pay | Admitting: Family Medicine

## 2017-05-02 DIAGNOSIS — H401333 Pigmentary glaucoma, bilateral, severe stage: Secondary | ICD-10-CM | POA: Diagnosis not present

## 2017-05-02 DIAGNOSIS — F3341 Major depressive disorder, recurrent, in partial remission: Secondary | ICD-10-CM | POA: Diagnosis not present

## 2017-05-02 DIAGNOSIS — H47023 Hemorrhage in optic nerve sheath, bilateral: Secondary | ICD-10-CM | POA: Diagnosis not present

## 2017-05-02 DIAGNOSIS — H21233 Degeneration of iris (pigmentary), bilateral: Secondary | ICD-10-CM | POA: Diagnosis not present

## 2017-05-02 DIAGNOSIS — H401233 Low-tension glaucoma, bilateral, severe stage: Secondary | ICD-10-CM | POA: Diagnosis not present

## 2017-05-07 DIAGNOSIS — H47021 Hemorrhage in optic nerve sheath, right eye: Secondary | ICD-10-CM | POA: Diagnosis not present

## 2017-05-07 DIAGNOSIS — H401213 Low-tension glaucoma, right eye, severe stage: Secondary | ICD-10-CM | POA: Diagnosis not present

## 2017-05-08 DIAGNOSIS — Z85828 Personal history of other malignant neoplasm of skin: Secondary | ICD-10-CM | POA: Diagnosis not present

## 2017-05-08 DIAGNOSIS — L820 Inflamed seborrheic keratosis: Secondary | ICD-10-CM | POA: Diagnosis not present

## 2017-05-08 DIAGNOSIS — L570 Actinic keratosis: Secondary | ICD-10-CM | POA: Diagnosis not present

## 2017-05-08 DIAGNOSIS — D225 Melanocytic nevi of trunk: Secondary | ICD-10-CM | POA: Diagnosis not present

## 2017-05-15 NOTE — Progress Notes (Signed)
Cardiology Office Note   Date:  05/18/2017   ID:  [name] [name], DOB 26-Oct-1940, MRN [id number]  PCP:  [name], MD  Cardiologist:   Jenkins Rouge, MD / Quay Burow  No chief complaint on file.     History of Present Illness: [name] [name] is a 76 y.o. female who presents for f/u of her HTN and PVD. Primarily seen by Dr Gwenlyn Found.  Previous left iliac artery stenting in July 2017 with 60% residual left mid SFA disease ABI"s done 12/18/16 where Normal at 1.1 bilaterally. No history of CAD EF 55-60% by echo 08/25/16 no significant valve disease Carotids With 75-91% LICA stenosis duplex 01/07/16 Has not had recent stress testing Quit smoking in 2012 She is Intolerant to statins and is on zetia   Younger sister being Rx for brain tumor at [facility] for neck issues  Pain in legs from arthritis    Past Medical History:  Diagnosis Date  . Anxiety   . Arthritis    RA  . Carpal tunnel syndrome, bilateral   . Chronic kidney disease    CKD stage III, absence of left kidney  . Congenital absence of one kidney    Pt has right kidney only  . Depression   . GERD (gastroesophageal reflux disease)   . Headache   . Heart murmur   . Hypercholesteremia   . Hypertension   . Peripheral vascular disease ([place])    a. s/p R external iliac stent '06 with angioplasty '13 (Dr. Benjamine Sprague) b. 01/2016: s/p PTA/stenting in L common iliac artery (Dr. Gwenlyn Found)  . Polymyalgia rheumatica ([place])   . Psoriasis     Past Surgical History:  Procedure Laterality Date  . ABDOMINAL HYSTERECTOMY  1963   Partial,  Due to bleeding after delivery  . APPENDECTOMY    . CARPAL TUNNEL RELEASE  1990  . CERVICAL FUSION  04/19/2012  . EYE SURGERY    . ILIAC ARTERY STENT    . TONSILLECTOMY       Current Outpatient Medications  Medication Sig Dispense Refill  . acetaminophen (TYLENOL) 325 MG tablet Take 650 mg by mouth every 6 (six) hours as needed for mild pain.    [name] albuterol (PROVENTIL) (2.5 MG/3ML) 0.083%  nebulizer solution Take 3 mLs (2.5 mg total) by nebulization every 4 (four) hours as needed for shortness of breath. 75 mL 2  . aspirin EC 81 MG EC tablet Take 1 tablet (81 mg total) by mouth daily.    . Cholecalciferol (VITAMIN D) 2000 UNITS tablet Take 2,000 Units by mouth daily.    . citalopram (CELEXA) 20 MG tablet Take 1 tablet (20 mg total) by mouth daily. 30 tablet 3  . clopidogrel (PLAVIX) 75 MG tablet TAKE ONE TABLET BY MOUTH IN THE MORNING 90 tablet 3  . cyclobenzaprine (FLEXERIL) 10 MG tablet Take 10 mg by mouth 3 (three) times daily as needed for muscle spasms.    . dorzolamide (TRUSOPT) 2 % ophthalmic solution Place 2 drops into both eyes daily.    . furosemide (LASIX) 20 MG tablet Take 20 mg daily by mouth.    . lamoTRIgine (LAMICTAL) 25 MG tablet Take 2 tablets (50 mg total) by mouth daily. 60 tablet 0  . latanoprost (XALATAN) 0.005 % ophthalmic solution Place 2 drops into both eyes at bedtime.     [name] loperamide (IMODIUM) 2 MG capsule Take 2 capsules (4 mg total) by mouth as needed for diarrhea or loose stools. [place]  capsule 0  . metoprolol tartrate (LOPRESSOR) 25 MG tablet Take 0.5 tablets (12.5 mg total) by mouth 2 (two) times daily. 30 tablet 11  . mirtazapine (REMERON) 15 MG tablet Take 1 tablet daily by mouth.  0  . Multiple Vitamin (MULTIVITAMIN WITH MINERALS) TABS tablet Take 1 tablet by mouth daily.    . pantoprazole (PROTONIX) 20 MG tablet Take 1 tablet (20 mg total) by mouth daily. 90 tablet 1  . polyethylene glycol (MIRALAX / GLYCOLAX) packet Take 17 g by mouth daily after supper.     . sodium chloride (MURO 128) 5 % ophthalmic ointment Place 1 application into both eyes 2 (two) times daily.     . traMADol (ULTRAM) 50 MG tablet Take 2 tablets (100 mg total) by mouth 2 (two) times daily as needed for moderate pain. 60 tablet 3  . traZODone (DESYREL) 100 MG tablet Take 1 tablet daily by mouth.     No current facility-administered medications for this visit.     Allergies:    Gabapentin and Statins    Social History:  The patient  reports that she quit smoking about 6 years ago. Her smoking use included cigarettes. She has a 40.00 pack-year smoking history. she has never used smokeless tobacco. She reports that she does not drink alcohol or use drugs.   Family History:  The patient's family history includes Angina in her mother; Cancer in her sister; Heart attack in her father; Melanoma in her brother.    ROS:  Please see the history of present illness.   Otherwise, review of systems are positive for none.   All other systems are reviewed and negative.    PHYSICAL EXAM: VS:  BP 122/86   Pulse 76   Ht 4' 11.5" (1.511 m)   Wt 148 lb 8 oz (67.4 kg)   SpO2 98%   BMI 29.49 kg/m  , BMI Body mass index is 29.49 kg/m. Affect appropriate Healthy:  appears stated age 29: normal Neck supple with no adenopathy JVP normal no bruits no thyromegaly Lungs clear with no wheezing and good diaphragmatic motion Heart:  S1/S2 no murmur, no rub, gallop or click PMI normal Abdomen: benighn, BS positve, no tenderness, no AAA no bruit.  No HSM or HJR Distal pulses intact with no bruits No edema Neuro non-focal Skin chronic stasis and dry  No muscular weakness    EKG:  NSR PAC otherwise normal 09/20/16    Recent Labs: 08/26/2016: B Natriuretic Peptide 12.9 10/17/2016: ALT 7; TSH 0.871 01/09/2017: Hemoglobin 11.1; Platelets 192 02/09/2017: BUN 24; Creatinine, Ser 1.44; Potassium 4.4; Sodium 142    Lipid Panel    Component Value Date/Time   CHOL 284 (H) 07/31/2016 1046   TRIG 161 (H) 07/31/2016 1046   HDL 62 07/31/2016 1046   CHOLHDL 4.6 07/31/2016 1046   VLDL 32 (H) 07/31/2016 1046   LDLCALC 190 (H) 07/31/2016 1046      Wt Readings from Last 3 Encounters:  05/18/17 148 lb 8 oz (67.4 kg)  03/09/17 148 lb (67.1 kg)  02/09/17 150 lb (68 kg)      Other studies Reviewed: Additional studies/ records that were reviewed today include: Notes PV Dr Gwenlyn Found ABI's,  carotid duplex LE angiogram  And stent intervention .    ASSESSMENT AND PLAN:  1.  PVD ABI's normal stable pain in legs from arthritis f/u Dr Gwenlyn Found 2. Bruit bilateral bruits 84-16% LICA f/u duplex with Dr Gwenlyn Found in 6 months  3. HTN  Well  controlled.  Continue current medications and low sodium Dash type diet.   4. Cholesterol f/u lipid clinic continue zetia intolerant to statins     Current medicines are reviewed at length with the patient today.  The patient does not have concerns regarding medicines.  The following changes have been made:  no change  Labs/ tests ordered today include: none  No orders of the defined types were placed in this encounter.    Disposition:   FU with Dr Gwenlyn Found as her primary issues are vascular      Signed, Jenkins Rouge, MD  05/18/2017 10:30 AM    [address], [place], [place]  [address] Phone: [phone]; Fax: [phone]

## 2017-05-16 DIAGNOSIS — F3341 Major depressive disorder, recurrent, in partial remission: Secondary | ICD-10-CM | POA: Diagnosis not present

## 2017-05-17 DIAGNOSIS — F3341 Major depressive disorder, recurrent, in partial remission: Secondary | ICD-10-CM | POA: Diagnosis not present

## 2017-05-18 ENCOUNTER — Encounter: Payer: Self-pay | Admitting: Cardiovascular Disease

## 2017-05-18 ENCOUNTER — Ambulatory Visit: Payer: PPO | Admitting: Cardiovascular Disease

## 2017-05-18 VITALS — BP 122/86 | HR 76 | Ht 59.5 in | Wt 148.5 lb

## 2017-05-18 DIAGNOSIS — I10 Essential (primary) hypertension: Secondary | ICD-10-CM | POA: Diagnosis not present

## 2017-05-18 DIAGNOSIS — I739 Peripheral vascular disease, unspecified: Secondary | ICD-10-CM

## 2017-05-18 DIAGNOSIS — E785 Hyperlipidemia, unspecified: Secondary | ICD-10-CM | POA: Diagnosis not present

## 2017-05-18 NOTE — Patient Instructions (Addendum)
Medication Instructions:  Your physician recommends that you continue on your current medications as directed. Please refer to the Current Medication list given to you today.  Labwork: NONE  Testing/Procedures: NONE  Follow-Up: Your physician wants you to follow-up in: 6 months with Dr. Gwenlyn Found. You will receive a reminder letter in the mail two months in advance. If you don't receive a letter, please call our office to schedule the follow-up appointment.   If you need a refill on your cardiac medications before your next appointment, please call your pharmacy.

## 2017-05-28 DIAGNOSIS — H47022 Hemorrhage in optic nerve sheath, left eye: Secondary | ICD-10-CM | POA: Diagnosis not present

## 2017-05-28 DIAGNOSIS — H401223 Low-tension glaucoma, left eye, severe stage: Secondary | ICD-10-CM | POA: Diagnosis not present

## 2017-06-01 ENCOUNTER — Other Ambulatory Visit: Payer: Self-pay | Admitting: Family Medicine

## 2017-06-12 ENCOUNTER — Encounter: Payer: Self-pay | Admitting: Family Medicine

## 2017-06-12 ENCOUNTER — Ambulatory Visit: Payer: PPO | Admitting: Family Medicine

## 2017-06-12 ENCOUNTER — Other Ambulatory Visit: Payer: Self-pay

## 2017-06-12 VITALS — BP 140/64 | HR 72 | Temp 97.8°F | Ht 59.5 in | Wt 150.6 lb

## 2017-06-12 DIAGNOSIS — M5416 Radiculopathy, lumbar region: Secondary | ICD-10-CM

## 2017-06-12 MED ORDER — DICLOFENAC SODIUM 1 % TD GEL: 2.0000 g | Freq: Four times a day (QID) | TRANSDERMAL | Status: DC

## 2017-06-12 NOTE — Patient Instructions (Signed)
Today we talked about your back pain, leg pain, blood pressure, and switch of your pharmacies. I have placed an order for an MRI which I think is the next step to diagnose your back pain. I am concerned that it is possibly due to a bony fracture or a herniated disk. I will also place a referral to ortho spine for possible back injections/future management once the MRI is done. I have prescribed you voltaren gel, please apply this to affected areas. Continue to try and take tramadol as needed and limit your ibuprofen use. Regarding your leg pain it is likely a soft tissue bruise after hitting your leg with a car door. We will check your blood pressure at your next visit.

## 2017-06-14 MED ORDER — DICLOFENAC SODIUM 1 % TD GEL: 4.0000 g | Freq: Four times a day (QID) | TRANSDERMAL | 0 refills | Status: DC

## 2017-06-19 ENCOUNTER — Other Ambulatory Visit: Payer: Self-pay | Admitting: Family Medicine

## 2017-06-23 ENCOUNTER — Ambulatory Visit
Admission: RE | Admit: 2017-06-23 | Discharge: 2017-06-23 | Disposition: A | Discharge status: Home / Self Care | Payer: PPO | Source: Ambulatory Visit | Attending: Family Medicine | Admitting: Family Medicine

## 2017-06-23 DIAGNOSIS — M48061 Spinal stenosis, lumbar region without neurogenic claudication: Secondary | ICD-10-CM | POA: Diagnosis not present

## 2017-06-23 DIAGNOSIS — M5416 Radiculopathy, lumbar region: Secondary | ICD-10-CM

## 2017-06-25 ENCOUNTER — Encounter: Payer: Self-pay | Admitting: Family Medicine

## 2017-06-25 NOTE — Assessment & Plan Note (Signed)
While patient does have a long history of cervical stenosis requiring injections, her relatively minor trauma producing such severe pain is concerning for either bony injury, or disk herniation. Disk herniation is especially concerning given her symptoms or burning on her posterior Bilateral legs. Will get an MRI to rule out these causes. If they are negative will refer back to spine for injection for cervical stenosis. Will prescribe volaren gel to apply over tender point.

## 2017-06-25 NOTE — Progress Notes (Signed)
HPI 76 year old who presents with back pain. She states the pain originally started around a week ago when she was bending over to try and pick up a jug of milk. She states that she had sudden onset lower back pain at that time. She thought that perhaps she had pulled a muscle and didn't think a lot of it. It has failed to improve and she has since grown concerned that something more serious has happened. She has tried nothing to relieve the pain. It has limited her ability to ambulate. She complains of point tenderness over that part of her back. She does have a shooting sensation in her BLE. She states it is a burning.  She has an additional complaint of leg pain. She states this has also been going on for a couple of weeks. She states that it is related to hitting her leg on a car door and that it is slowly resolving.  CC: back pain   ROS:  Review of Systems  Constitutional: Negative for chills and fever.  HENT: Negative for congestion and hearing loss.   Respiratory: Negative for cough, hemoptysis and sputum production.   Gastrointestinal: Negative for constipation, diarrhea, nausea and vomiting.  Genitourinary: Negative for dysuria and urgency.  Musculoskeletal: Positive for back pain.  Skin: Negative for itching and rash.    Review of Systems See HPI for ROS.   CC, SH/smoking status, and VS noted  Objective: BP 140/64   Pulse 72   Temp 97.8 F (36.6 C) (Oral)   Ht 4' 11.5" (1.511 m)   Wt 150 lb 9.6 oz (68.3 kg)   SpO2 96%   BMI 29.91 kg/m  Gen: NAD, alert, cooperative, and pleasant. HEENT: NCAT, EOMI, PERRL CV: RRR, no murmur Resp: CTAB, no wheezes, non-labored Abd: SNTND, BS present, no guarding or organomegaly Ext: No edema, warm Neuro: Alert and oriented, Speech clear, No gross deficits RLE: some point tenderness over tibia on RLE. Back: point tenderness of lumbar vertebrae, limited extension and flexion   Assessment and plan:  No problem-specific  Assessment & Plan notes found for this encounter.   Orders Placed This Encounter  Procedures  . MR LUMBAR SPINE WO CONTRAST    Wt 150/walker/claus/no metal in eyes or body/no hx of lumbar sx/no brain,heart,eye or ear sx/stent in right leg before 2015 had mr since/kidney stent 2016/pt has stent cards/HTA/PLM AND PT WITH FAXED ORDER  Stents ok per Cecille Rubin 06-12-17-plm     Standing Status:   Future    Number of Occurrences:   1    Standing Expiration Date:   08/13/2018    Order Specific Question:   What is the patient's sedation requirement?    Answer:   No Sedation    Order Specific Question:   Does the patient have a pacemaker or implanted devices?    Answer:   No    Order Specific Question:   Preferred imaging location?    Answer:   [facility] (table limit-550lbs)    Order Specific Question:   Radiology Contrast Protocol - do NOT remove file path    Answer:   [email or web address]    Meds ordered this encounter  Medications  . DISCONTD: diclofenac sodium (VOLTAREN) 1 % transdermal gel 2 g  . diclofenac sodium (VOLTAREN) 1 % GEL    Sig: Apply 4 g topically 4 (four) times daily.    Dispense:  100 g    Refill:  0     [name]  MD PGY-1 Family Medicine Resident 06/25/2017 6:40 AM

## 2017-06-26 ENCOUNTER — Telehealth: Payer: Self-pay | Admitting: *Deleted

## 2017-06-26 DIAGNOSIS — M48061 Spinal stenosis, lumbar region without neurogenic claudication: Secondary | ICD-10-CM

## 2017-06-26 DIAGNOSIS — M9983 Other biomechanical lesions of lumbar region: Principal | ICD-10-CM

## 2017-06-26 NOTE — Telephone Encounter (Signed)
Called to discuss MRI results. It appears that there is a new area of slight herniation of her disk at the L4-L5. Will refer patient to spine for further evaluation as I believe this is the cause of her pain.  [name] MD PGY-1 Family Medicine Resident

## 2017-06-26 NOTE — Telephone Encounter (Signed)
Patient left message on nurse line requesting return call from PCP to discuss MRI results. Hubbard Hartshorn, RN, BSN

## 2017-06-27 DIAGNOSIS — F3341 Major depressive disorder, recurrent, in partial remission: Secondary | ICD-10-CM | POA: Diagnosis not present

## 2017-07-09 DIAGNOSIS — H401333 Pigmentary glaucoma, bilateral, severe stage: Secondary | ICD-10-CM | POA: Diagnosis not present

## 2017-07-09 DIAGNOSIS — H16223 Keratoconjunctivitis sicca, not specified as Sjogren's, bilateral: Secondary | ICD-10-CM | POA: Diagnosis not present

## 2017-07-09 DIAGNOSIS — H401233 Low-tension glaucoma, bilateral, severe stage: Secondary | ICD-10-CM | POA: Diagnosis not present

## 2017-07-09 DIAGNOSIS — H04123 Dry eye syndrome of bilateral lacrimal glands: Secondary | ICD-10-CM | POA: Diagnosis not present

## 2017-07-11 DIAGNOSIS — F3341 Major depressive disorder, recurrent, in partial remission: Secondary | ICD-10-CM | POA: Diagnosis not present

## 2017-07-18 DIAGNOSIS — F3341 Major depressive disorder, recurrent, in partial remission: Secondary | ICD-10-CM | POA: Diagnosis not present

## 2017-07-19 DIAGNOSIS — M5126 Other intervertebral disc displacement, lumbar region: Secondary | ICD-10-CM | POA: Diagnosis not present

## 2017-07-20 ENCOUNTER — Other Ambulatory Visit: Payer: Self-pay | Admitting: Neurosurgery

## 2017-07-24 DIAGNOSIS — F3341 Major depressive disorder, recurrent, in partial remission: Secondary | ICD-10-CM | POA: Diagnosis not present

## 2017-07-25 ENCOUNTER — Telehealth: Payer: Self-pay

## 2017-07-25 DIAGNOSIS — F3341 Major depressive disorder, recurrent, in partial remission: Secondary | ICD-10-CM | POA: Diagnosis not present

## 2017-07-25 NOTE — Telephone Encounter (Signed)
   [facility] Pre-operative Risk Assessment    Request for surgical clearance:  1. What type of surgery is being performed? Microdiscectomy   2. When is this surgery scheduled? NOT LISTED   3. Are there any medications that need to be held prior to surgery and how long?PLAVIX   4. Practice name and name of physician performing surgery? [facility]   DR Marylyn Ishihara [place] ATTN;NIKKI  5. What is your office phone and fax number? [phone] [place] [phone]   6. Anesthesia type (None, local, MAC, general) ? NOT LISTED   Waylan Rocher 07/25/2017, 9:40 AM  _________________________________________________________________   (provider comments below)

## 2017-07-25 NOTE — Telephone Encounter (Signed)
   Primary Cardiologist: No primary care provider on file.  Chart reviewed as part of pre-operative protocol coverage. Patient was contacted 07/25/2017 in reference to pre-operative risk assessment for pending surgery as outlined below.   I left a voicemail for the patient to return phone call to [facility]. Pt was seen by Dr. Johnsie Cancel on 05/18/17. Preop clearance was not provided in that note. Pt was instructed to follow up with Dr. Gwenlyn Found.  I will forward message to Dr. Gwenlyn Found regarding plavix.   Please forward response RE plavix to the CV DIV PREOP pool.    [place], PA 07/25/2017, 2:08 PM

## 2017-07-25 NOTE — Telephone Encounter (Signed)
OK to interrupt plavix for surg

## 2017-07-26 ENCOUNTER — Telehealth: Payer: Self-pay | Admitting: Cardiology

## 2017-07-26 NOTE — Telephone Encounter (Signed)
error 

## 2017-07-26 NOTE — Telephone Encounter (Signed)
I called the pt. She is not having chest pain. No further cardiac work up required pre op. OK to hold Plavix 5 days pre op and ASA the day of, resumes ASAP post op.

## 2017-08-01 ENCOUNTER — Encounter (HOSPITAL_COMMUNITY): Payer: Self-pay | Admitting: Emergency Medicine

## 2017-08-01 NOTE — Telephone Encounter (Signed)
Refaxed clearance to [place] per request

## 2017-08-02 ENCOUNTER — Other Ambulatory Visit: Payer: Self-pay | Admitting: Family Medicine

## 2017-08-02 MED ORDER — TRAMADOL HCL 50 MG PO TABS: 100.0000 mg | ORAL_TABLET | Freq: Two times a day (BID) | ORAL | 3 refills | Status: DC | PRN

## 2017-08-02 NOTE — Telephone Encounter (Signed)
Pt needs refill on tramadol. She has an appt feb 12 but will run out before then, Pharmacy is [facility] [address]

## 2017-08-06 ENCOUNTER — Inpatient Hospital Stay (HOSPITAL_COMMUNITY): Admission: RE | Admit: 2017-08-06 | Payer: Self-pay | Source: Ambulatory Visit

## 2017-08-09 ENCOUNTER — Encounter (HOSPITAL_COMMUNITY): Admission: RE | Payer: Self-pay | Source: Ambulatory Visit

## 2017-08-09 ENCOUNTER — Ambulatory Visit (HOSPITAL_COMMUNITY): Admission: RE | Admit: 2017-08-09 | Payer: PPO | Source: Ambulatory Visit | Admitting: Neurosurgery

## 2017-08-09 SURGERY — LUMBAR LAMINECTOMY/DECOMPRESSION MICRODISCECTOMY 1 LEVEL
Anesthesia: General | Laterality: Left

## 2017-08-10 ENCOUNTER — Ambulatory Visit: Payer: PPO | Admitting: Family Medicine

## 2017-08-10 ENCOUNTER — Other Ambulatory Visit: Payer: Self-pay

## 2017-08-10 ENCOUNTER — Encounter: Payer: Self-pay | Admitting: Family Medicine

## 2017-08-10 VITALS — BP 132/68 | HR 84 | Temp 98.1°F | Ht 59.5 in | Wt 144.4 lb

## 2017-08-10 DIAGNOSIS — R300 Dysuria: Secondary | ICD-10-CM | POA: Diagnosis not present

## 2017-08-10 LAB — POCT URINALYSIS DIP (MANUAL ENTRY)
Bilirubin, UA: NEGATIVE
Blood, UA: NEGATIVE
Glucose, UA: NEGATIVE mg/dL
Ketones, POC UA: NEGATIVE mg/dL
Nitrite, UA: NEGATIVE
Protein Ur, POC: NEGATIVE mg/dL
Spec Grav, UA: 1.02 (ref 1.010–1.025)
Urobilinogen, UA: 0.2 E.U./dL
pH, UA: 5.5 (ref 5.0–8.0)

## 2017-08-10 LAB — POCT UA - MICROSCOPIC ONLY: WBC, Ur, HPF, POC: >20

## 2017-08-10 MED ORDER — TRAMADOL HCL 50 MG PO TABS: 100.0000 mg | ORAL_TABLET | Freq: Two times a day (BID) | ORAL | 3 refills | Status: DC | PRN

## 2017-08-10 NOTE — Patient Instructions (Signed)
[name] [name], you came in today for burning with urination.  Your urine looks okay, but we will be culturing it to make sure that there is no bacteria.  I would like you to follow-up with Dr. Kris Mouton next week to make sure that your symptoms have improved also discuss your back pain.  If the urine culture is negative it would be reasonable to make sure that you are not having any vaginal bleeding.   If you develop any left or right sided acute back pain, fevers, chills please come back sooner.  Was very nice to see you today, [name] [name], [place] Resident PGY-2 08/10/2017 11:49 AM

## 2017-08-10 NOTE — Progress Notes (Signed)
    Subjective:  [name] [name] is a 77 y.o. female who presents to the [facility] today with a chief complaint of dysuria.   HPI:  DYSURIA  Pain urinating started 2 days ago. Pain is: burning Medications tried: GC powder Any antibiotics in the last 30 days: none More than 3 UTIs in the last 12 months: one STD exposure: none Possibly pregnant: no  Symptoms Urgency: none Frequency: none Blood in urine: none Pain in back: chronic back pain Fever: possibly Vaginal discharge: none  Review of Symptoms - see HPI PMH - Smoking status noted.     Tobacco use: quit 2012, quit drinking 1991 Medication: reviewed and updated ROS: see HPI   Objective:  Physical Exam: BP 132/68   Pulse 84   Temp 98.1 F (36.7 C) (Oral)   Ht 4' 11.5" (1.511 m)   Wt 144 lb 6.4 oz (65.5 kg)   SpO2 98%   BMI 28.68 kg/m   Gen: 76yo F in NAD, resting comfortably CV: RRR with no murmurs appreciated Pulm: NWOB, CTAB with no crackles, wheezes, or rhonchi GI: Normal bowel sounds present. Soft, Nontender, Nondistended, +suprapubic tenderness  No results found for this or any previous visit (from the past 72 hour(s)).   Assessment/Plan:  Urinary tract infection Patient initially presented with dysuria and suprapubic tenderness without red flag signs or symptoms and no CVA tenderness, but had normal UA.  She was discharged home with return precautions and urine culture grew E. coli with greater than 100,000 CFU's.  Culture was pansensitive.  Called patient and unfortunately had to leave a message informing her that she had urinary infection and prescribed Keflex 500 mg twice daily times 5 days and discussed return precautions.   Roczen Waymire L. Rosalyn Gess, [place] Resident PGY-2 08/14/2017 1:25 PM

## 2017-08-12 LAB — URINE CULTURE

## 2017-08-14 ENCOUNTER — Encounter: Payer: Self-pay | Admitting: Family Medicine

## 2017-08-14 ENCOUNTER — Other Ambulatory Visit: Payer: Self-pay | Admitting: Family Medicine

## 2017-08-14 ENCOUNTER — Telehealth: Payer: Self-pay | Admitting: *Deleted

## 2017-08-14 MED ORDER — CEPHALEXIN 500 MG PO CAPS: 500.0000 mg | ORAL_CAPSULE | Freq: Two times a day (BID) | ORAL | 0 refills | Status: AC

## 2017-08-14 NOTE — Telephone Encounter (Signed)
Family Medicine telephone note  It appears that the patient scheduled a same day appointment with dr. Rosalyn Gess. Looks like a treated UTI, will consider this problem taken care of.  [name] MD PGY-1 Family Medicine Resident

## 2017-08-14 NOTE — Telephone Encounter (Signed)
Patient left another message on nurse line asking why she was prescribed an antibiotic. Called patient back and left message on identified voicemail that urine culture was positive for infection and antibiotic sent to treat. Danley Danker, RN [facility] RN)

## 2017-08-14 NOTE — Progress Notes (Signed)
Called Ms. Stegenga to let her know that she does have a urinary infection that is pansensitive and I prescribed Keflex 500 mg twice daily for 5 days.  At her last visit I recommended that she follow-up with Dr. Kris Mouton for her back pain and he can reevaluate her urinary symptoms at that time.  Discussed return precautions.  Unfortunately had to leave a message for Ms. Cryder.   Arianah Torgeson L. Rosalyn Gess, [place] [facility] Resident PGY-2 08/14/2017 8:37 AM

## 2017-08-14 NOTE — Telephone Encounter (Signed)
Pt lm on nurse line.  She is returning a call.  No message in chart, will forward to PCP. Pieper Kasik, Salome Spotted, CMA

## 2017-08-16 DIAGNOSIS — R262 Difficulty in walking, not elsewhere classified: Secondary | ICD-10-CM | POA: Diagnosis not present

## 2017-08-16 DIAGNOSIS — I158 Other secondary hypertension: Secondary | ICD-10-CM | POA: Diagnosis not present

## 2017-08-16 DIAGNOSIS — M5442 Lumbago with sciatica, left side: Secondary | ICD-10-CM | POA: Diagnosis not present

## 2017-08-16 DIAGNOSIS — M6281 Muscle weakness (generalized): Secondary | ICD-10-CM | POA: Diagnosis not present

## 2017-08-16 DIAGNOSIS — M545 Low back pain: Secondary | ICD-10-CM | POA: Diagnosis not present

## 2017-08-16 DIAGNOSIS — M5187 Other intervertebral disc disorders, lumbosacral region: Secondary | ICD-10-CM | POA: Diagnosis not present

## 2017-08-16 DIAGNOSIS — M4727 Other spondylosis with radiculopathy, lumbosacral region: Secondary | ICD-10-CM | POA: Diagnosis not present

## 2017-08-16 DIAGNOSIS — F418 Other specified anxiety disorders: Secondary | ICD-10-CM | POA: Diagnosis not present

## 2017-08-20 DIAGNOSIS — F418 Other specified anxiety disorders: Secondary | ICD-10-CM | POA: Diagnosis not present

## 2017-08-20 DIAGNOSIS — M6281 Muscle weakness (generalized): Secondary | ICD-10-CM | POA: Diagnosis not present

## 2017-08-20 DIAGNOSIS — R262 Difficulty in walking, not elsewhere classified: Secondary | ICD-10-CM | POA: Diagnosis not present

## 2017-08-20 DIAGNOSIS — M5187 Other intervertebral disc disorders, lumbosacral region: Secondary | ICD-10-CM | POA: Diagnosis not present

## 2017-08-20 DIAGNOSIS — I158 Other secondary hypertension: Secondary | ICD-10-CM | POA: Diagnosis not present

## 2017-08-20 DIAGNOSIS — M4727 Other spondylosis with radiculopathy, lumbosacral region: Secondary | ICD-10-CM | POA: Diagnosis not present

## 2017-08-20 DIAGNOSIS — M5442 Lumbago with sciatica, left side: Secondary | ICD-10-CM | POA: Diagnosis not present

## 2017-08-20 DIAGNOSIS — M545 Low back pain: Secondary | ICD-10-CM | POA: Diagnosis not present

## 2017-08-21 ENCOUNTER — Ambulatory Visit (INDEPENDENT_AMBULATORY_CARE_PROVIDER_SITE_OTHER): Payer: PPO | Admitting: Family Medicine

## 2017-08-21 VITALS — BP 140/70 | HR 74 | Temp 98.1°F | Wt 145.0 lb

## 2017-08-21 DIAGNOSIS — H6690 Otitis media, unspecified, unspecified ear: Secondary | ICD-10-CM | POA: Diagnosis not present

## 2017-08-21 DIAGNOSIS — S81801A Unspecified open wound, right lower leg, initial encounter: Secondary | ICD-10-CM | POA: Diagnosis not present

## 2017-08-21 DIAGNOSIS — M48062 Spinal stenosis, lumbar region with neurogenic claudication: Secondary | ICD-10-CM | POA: Diagnosis not present

## 2017-08-21 DIAGNOSIS — M4722 Other spondylosis with radiculopathy, cervical region: Secondary | ICD-10-CM

## 2017-08-21 DIAGNOSIS — M5126 Other intervertebral disc displacement, lumbar region: Secondary | ICD-10-CM | POA: Diagnosis not present

## 2017-08-21 DIAGNOSIS — M5416 Radiculopathy, lumbar region: Secondary | ICD-10-CM | POA: Diagnosis not present

## 2017-08-21 DIAGNOSIS — N3000 Acute cystitis without hematuria: Secondary | ICD-10-CM | POA: Diagnosis not present

## 2017-08-21 MED ORDER — AMOXICILLIN-POT CLAVULANATE 875-125 MG PO TABS: 1.0000 | ORAL_TABLET | Freq: Two times a day (BID) | ORAL | 0 refills | Status: DC

## 2017-08-21 NOTE — Patient Instructions (Addendum)
It was great seeing you today! I am sorry that you have had such a tough time with your back and am thrilled that you will now be having injection performed for relief. Regarding your UTI, I am glad that things have been doing well from that standpoint. For your ear pain, I believe you have an infection of your inner ear. I will give you a 7 day course of antibiotics. I gave you this script prior to leaving. Regarding your leg wound, I believe it looks pretty good considering. Continue to use neosporin and bandages to protect the wound. The antibiotic will also likely treat any causal bacteria although I do not believe it to be infected. If your leg begins to look a lot worse (redness, swelling, etc.) please come back and see [place]. If you ear does not improve, please come back and see [place] in 1 week.

## 2017-08-21 NOTE — Progress Notes (Signed)
HPI 77 year old female who presented for follow up as well as evaluation of a right leg wound. Patient recently seen on 2/1 by Dr. Rosalyn Gess for a UTI, prescribed keflex. Has had no issues and all symptoms resolved.  On 2/7 patient was getting into her car when she fell into a bush. Apparently part of a stick became lodged into her leg. She has been caring for the wound at home with neosporin and dressing changes. Initially had a good amount of bleeding 2/2 to her dual antiplatelet therapy. Bleeding has stopped and is healing well. No swelling in this area and it has not felt warm per her account.  Patient evaluated by Neurosurgery for her back pain. They are going to opt for intraspinal injections for pain management presumably given her age and morbidities.  Patient complaints of left ear "fullness" and pain. States that this started on 2/9. It is most painful when she pushed on the concha of the ear. States that it is fairly painful and that she is having some difficulty with hearing on that side.    CC: Leg wound, follow up   ROS: Review of Systems  Constitutional: Negative for chills and fever.  HENT: Positive for congestion and ear pain. Negative for ear discharge, hearing loss, sinus pain, sore throat and tinnitus.   Eyes: Negative for pain, discharge and redness.  Respiratory: Negative for cough and hemoptysis.   Cardiovascular: Negative for chest pain and palpitations.  Gastrointestinal: Negative for abdominal pain, diarrhea, nausea and vomiting.  Genitourinary: Negative for dysuria and urgency.  Musculoskeletal: Positive for back pain and joint pain. Negative for myalgias and neck pain.       Right leg wound  Neurological: Negative for dizziness and weakness.  Psychiatric/Behavioral: Negative for depression and suicidal ideas.    Review of Systems See HPI for ROS.   CC, SH/smoking status, and VS noted  Objective: BP 140/70 (BP Location: Left Arm, Patient Position:  Sitting, Cuff Size: Normal)   Pulse 74   Temp 98.1 F (36.7 C) (Oral)   Wt 65.8 kg (145 lb)   SpO2 98%   BMI 28.80 kg/m  Gen: elderly, pleasant, alert, cooperative HEENT: NCAT, EOMI, PERRL, bulging left tympanic membrane, mild erythema, right ear normal CV: RRR, no murmur Resp: CTAB, no wheezes, non-labored Abd: SNTND, BS present, no guarding or organomegaly Ext: No edema, warm Neuro: Alert and oriented, Speech clear, No gross deficits Right Leg: well healing circumferential wound with minimal oozing present, bruising present around wound   Assessment and plan:  Cervical spondylosis with radiculopathy Has now been seen by Neurosurgery. Initially had planned to perform an operation to fix disk herniation but have opted for conservative therapy. Patient to continue seeing neurosurgery for these injections.  Otitis media Patient with otitis media. Very tender to touch in back of the year and has bulging L TM. Will treat with augmentin for 7 day course. - augmentin for 7 days  UTI (urinary tract infection) Doing very well from this standpoint. Finished course of keflex. Problem resolved.  Leg wound, right Patient with superficial traumatic leg wound to right leg. Healing well with neosporin and daily bandage changes. Any causal organisms would likely be covered by augmentin prescribed for ear infection, but no s/s of infection.   No orders of the defined types were placed in this encounter.   Meds ordered this encounter  Medications  . amoxicillin-clavulanate (AUGMENTIN) 875-125 MG tablet    Sig: Take 1 tablet by mouth  2 (two) times daily.    Dispense:  14 tablet    Refill:  0    [name] MD PGY-1 Family Medicine Resident 08/26/2017 1:44 AM

## 2017-08-22 DIAGNOSIS — M545 Low back pain: Secondary | ICD-10-CM | POA: Diagnosis not present

## 2017-08-22 DIAGNOSIS — M6281 Muscle weakness (generalized): Secondary | ICD-10-CM | POA: Diagnosis not present

## 2017-08-22 DIAGNOSIS — F418 Other specified anxiety disorders: Secondary | ICD-10-CM | POA: Diagnosis not present

## 2017-08-22 DIAGNOSIS — M4727 Other spondylosis with radiculopathy, lumbosacral region: Secondary | ICD-10-CM | POA: Diagnosis not present

## 2017-08-22 DIAGNOSIS — R262 Difficulty in walking, not elsewhere classified: Secondary | ICD-10-CM | POA: Diagnosis not present

## 2017-08-22 DIAGNOSIS — M5442 Lumbago with sciatica, left side: Secondary | ICD-10-CM | POA: Diagnosis not present

## 2017-08-22 DIAGNOSIS — I158 Other secondary hypertension: Secondary | ICD-10-CM | POA: Diagnosis not present

## 2017-08-22 DIAGNOSIS — M5187 Other intervertebral disc disorders, lumbosacral region: Secondary | ICD-10-CM | POA: Diagnosis not present

## 2017-08-23 ENCOUNTER — Telehealth: Payer: Self-pay

## 2017-08-23 NOTE — Telephone Encounter (Signed)
   Primary Cardiologist: Quay Burow, MD  Chart reviewed as part of pre-operative protocol coverage. The patient was cleared per telephone note 07/26/17 by Kerin Ransom:  "I called the pt. She is not having chest pain. No further cardiac work up required pre op. OK to hold Plavix 5 days pre op and ASA the day of, resumes ASAP post op".   Will route to Dr. Gwenlyn Found as they are asking to hold "PLAVIX/ASA 7 DAYS". Please forward your response to P CV DIV PREOP. Thank you  Leanor Kail, PA 08/23/2017, 2:45 PM

## 2017-08-23 NOTE — Telephone Encounter (Signed)
   [facility] Pre-operative Risk Assessment    Request for surgical clearance:  1. What type of surgery is being performed? ESI LUMBAR   2. When is this surgery scheduled? APPT TO DISCUSS/SCHEDULE 08-31-17   3. What type of clearance is required (medical clearance vs. Pharmacy clearance to hold med vs. Both)? MEDICATION  4. Are there any medications that need to be held prior to surgery and how long?PLAVIX/ASA 7 DAYS   5. Practice name and name of physician performing surgery? [facility]   6. What is your office phone and fax number? [phone] FX [phone]   7. Anesthesia type (None, local, MAC, general) ? NOT LISTED   Waylan Rocher 08/23/2017, 9:41 AM  _________________________________________________________________   (provider comments below)

## 2017-08-24 DIAGNOSIS — M545 Low back pain: Secondary | ICD-10-CM | POA: Diagnosis not present

## 2017-08-24 DIAGNOSIS — M5442 Lumbago with sciatica, left side: Secondary | ICD-10-CM | POA: Diagnosis not present

## 2017-08-24 DIAGNOSIS — M6281 Muscle weakness (generalized): Secondary | ICD-10-CM | POA: Diagnosis not present

## 2017-08-24 DIAGNOSIS — I158 Other secondary hypertension: Secondary | ICD-10-CM | POA: Diagnosis not present

## 2017-08-24 DIAGNOSIS — M4727 Other spondylosis with radiculopathy, lumbosacral region: Secondary | ICD-10-CM | POA: Diagnosis not present

## 2017-08-24 DIAGNOSIS — F418 Other specified anxiety disorders: Secondary | ICD-10-CM | POA: Diagnosis not present

## 2017-08-24 DIAGNOSIS — R262 Difficulty in walking, not elsewhere classified: Secondary | ICD-10-CM | POA: Diagnosis not present

## 2017-08-24 DIAGNOSIS — M5187 Other intervertebral disc disorders, lumbosacral region: Secondary | ICD-10-CM | POA: Diagnosis not present

## 2017-08-24 NOTE — Telephone Encounter (Signed)
Faxed to [facility] via [facility].

## 2017-08-24 NOTE — Telephone Encounter (Signed)
OK to interrupt anti platelet Rx 

## 2017-08-24 NOTE — Telephone Encounter (Signed)
   Primary Cardiologist: Quay Burow, MD  Chart reviewed as part of pre-operative protocol coverage. Given past medical history and time since last visit, based on ACC/AHA guidelines, [name] [name] would be at acceptable risk for the planned procedure without further cardiovascular testing.   She is okay to hold the Plavix for 7 days prior to the procedure.  She is okay to hold the aspirin the day of the procedure but then resume the aspirin as soon as possible postop.  I will route this recommendation to the requesting party via Epic fax function and remove from pre-op pool.  Please call with questions.  Rosaria Ferries, PA-C 08/24/2017, 3:32 PM

## 2017-08-26 ENCOUNTER — Encounter: Payer: Self-pay | Admitting: Family Medicine

## 2017-08-26 DIAGNOSIS — H6690 Otitis media, unspecified, unspecified ear: Secondary | ICD-10-CM | POA: Insufficient documentation

## 2017-08-26 DIAGNOSIS — S81801A Unspecified open wound, right lower leg, initial encounter: Secondary | ICD-10-CM | POA: Insufficient documentation

## 2017-08-26 NOTE — Assessment & Plan Note (Signed)
Patient with superficial traumatic leg wound to right leg. Healing well with neosporin and daily bandage changes. Any causal organisms would likely be covered by augmentin prescribed for ear infection, but no s/s of infection.

## 2017-08-26 NOTE — Assessment & Plan Note (Signed)
Has now been seen by Neurosurgery. Initially had planned to perform an operation to fix disk herniation but have opted for conservative therapy. Patient to continue seeing neurosurgery for these injections.

## 2017-08-26 NOTE — Assessment & Plan Note (Signed)
Patient with otitis media. Very tender to touch in back of the year and has bulging L TM. Will treat with augmentin for 7 day course. - augmentin for 7 days

## 2017-08-26 NOTE — Assessment & Plan Note (Signed)
Doing very well from this standpoint. Finished course of keflex. Problem resolved.

## 2017-08-27 DIAGNOSIS — M5187 Other intervertebral disc disorders, lumbosacral region: Secondary | ICD-10-CM | POA: Diagnosis not present

## 2017-08-27 DIAGNOSIS — F418 Other specified anxiety disorders: Secondary | ICD-10-CM | POA: Diagnosis not present

## 2017-08-27 DIAGNOSIS — I158 Other secondary hypertension: Secondary | ICD-10-CM | POA: Diagnosis not present

## 2017-08-27 DIAGNOSIS — R262 Difficulty in walking, not elsewhere classified: Secondary | ICD-10-CM | POA: Diagnosis not present

## 2017-08-27 DIAGNOSIS — M5442 Lumbago with sciatica, left side: Secondary | ICD-10-CM | POA: Diagnosis not present

## 2017-08-27 DIAGNOSIS — M4727 Other spondylosis with radiculopathy, lumbosacral region: Secondary | ICD-10-CM | POA: Diagnosis not present

## 2017-08-27 DIAGNOSIS — M545 Low back pain: Secondary | ICD-10-CM | POA: Diagnosis not present

## 2017-08-27 DIAGNOSIS — M6281 Muscle weakness (generalized): Secondary | ICD-10-CM | POA: Diagnosis not present

## 2017-08-28 ENCOUNTER — Telehealth: Payer: Self-pay | Admitting: *Deleted

## 2017-08-28 NOTE — Telephone Encounter (Signed)
Received call from billing.  Patient reports she is concerned about her "stent".  Reports she had a UTI last week and has been vomiting and diarrhea since.   Reports a "little" fever.  Reports pain is in the left side at her rib cage.   Sore to touch, increases with palpitations.  Denies SOB/dizziness/lightheadedness.  Also reports she has noticed some red in her stool but reports she has been drinking a lot of cranberry juice.   Advised patient if pain is induced by palpitations, this is not cardiac related and advised to contact PCP to follow up on other symptoms.   Patient agreed and verbalized understanding.

## 2017-08-29 ENCOUNTER — Other Ambulatory Visit: Payer: Self-pay | Admitting: Family Medicine

## 2017-08-29 DIAGNOSIS — K219 Gastro-esophageal reflux disease without esophagitis: Secondary | ICD-10-CM

## 2017-08-31 DIAGNOSIS — M5187 Other intervertebral disc disorders, lumbosacral region: Secondary | ICD-10-CM | POA: Diagnosis not present

## 2017-08-31 DIAGNOSIS — M48062 Spinal stenosis, lumbar region with neurogenic claudication: Secondary | ICD-10-CM | POA: Diagnosis not present

## 2017-08-31 DIAGNOSIS — M6281 Muscle weakness (generalized): Secondary | ICD-10-CM | POA: Diagnosis not present

## 2017-08-31 DIAGNOSIS — M5442 Lumbago with sciatica, left side: Secondary | ICD-10-CM | POA: Diagnosis not present

## 2017-08-31 DIAGNOSIS — F418 Other specified anxiety disorders: Secondary | ICD-10-CM | POA: Diagnosis not present

## 2017-08-31 DIAGNOSIS — R262 Difficulty in walking, not elsewhere classified: Secondary | ICD-10-CM | POA: Diagnosis not present

## 2017-08-31 DIAGNOSIS — M545 Low back pain: Secondary | ICD-10-CM | POA: Diagnosis not present

## 2017-08-31 DIAGNOSIS — M4727 Other spondylosis with radiculopathy, lumbosacral region: Secondary | ICD-10-CM | POA: Diagnosis not present

## 2017-08-31 DIAGNOSIS — I158 Other secondary hypertension: Secondary | ICD-10-CM | POA: Diagnosis not present

## 2017-08-31 DIAGNOSIS — M5126 Other intervertebral disc displacement, lumbar region: Secondary | ICD-10-CM | POA: Diagnosis not present

## 2017-09-03 ENCOUNTER — Other Ambulatory Visit: Payer: Self-pay

## 2017-09-03 ENCOUNTER — Encounter (HOSPITAL_COMMUNITY): Payer: Self-pay | Admitting: Emergency Medicine

## 2017-09-03 ENCOUNTER — Emergency Department (HOSPITAL_COMMUNITY): Payer: PPO

## 2017-09-03 ENCOUNTER — Emergency Department (HOSPITAL_COMMUNITY)
Admission: EM | Admit: 2017-09-03 | Discharge: 2017-09-03 | Disposition: A | Discharge status: Other Acute Inpatient Hospital | Payer: PPO | Attending: Emergency Medicine | Admitting: Emergency Medicine

## 2017-09-03 DIAGNOSIS — I701 Atherosclerosis of renal artery: Secondary | ICD-10-CM | POA: Diagnosis not present

## 2017-09-03 DIAGNOSIS — M353 Polymyalgia rheumatica: Secondary | ICD-10-CM | POA: Diagnosis not present

## 2017-09-03 DIAGNOSIS — H409 Unspecified glaucoma: Secondary | ICD-10-CM | POA: Diagnosis not present

## 2017-09-03 DIAGNOSIS — K5909 Other constipation: Secondary | ICD-10-CM | POA: Diagnosis not present

## 2017-09-03 DIAGNOSIS — K219 Gastro-esophageal reflux disease without esophagitis: Secondary | ICD-10-CM | POA: Diagnosis not present

## 2017-09-03 DIAGNOSIS — J9811 Atelectasis: Secondary | ICD-10-CM | POA: Diagnosis not present

## 2017-09-03 DIAGNOSIS — F332 Major depressive disorder, recurrent severe without psychotic features: Secondary | ICD-10-CM | POA: Insufficient documentation

## 2017-09-03 DIAGNOSIS — E782 Mixed hyperlipidemia: Secondary | ICD-10-CM | POA: Diagnosis not present

## 2017-09-03 DIAGNOSIS — D631 Anemia in chronic kidney disease: Secondary | ICD-10-CM | POA: Diagnosis not present

## 2017-09-03 DIAGNOSIS — Z87891 Personal history of nicotine dependence: Secondary | ICD-10-CM | POA: Insufficient documentation

## 2017-09-03 DIAGNOSIS — R45851 Suicidal ideations: Secondary | ICD-10-CM | POA: Insufficient documentation

## 2017-09-03 DIAGNOSIS — N183 Chronic kidney disease, stage 3 (moderate): Secondary | ICD-10-CM | POA: Diagnosis not present

## 2017-09-03 DIAGNOSIS — Z7982 Long term (current) use of aspirin: Secondary | ICD-10-CM | POA: Insufficient documentation

## 2017-09-03 DIAGNOSIS — Z20828 Contact with and (suspected) exposure to other viral communicable diseases: Secondary | ICD-10-CM | POA: Diagnosis not present

## 2017-09-03 DIAGNOSIS — I739 Peripheral vascular disease, unspecified: Secondary | ICD-10-CM | POA: Diagnosis not present

## 2017-09-03 DIAGNOSIS — R10819 Abdominal tenderness, unspecified site: Secondary | ICD-10-CM | POA: Diagnosis present

## 2017-09-03 DIAGNOSIS — I10 Essential (primary) hypertension: Secondary | ICD-10-CM | POA: Diagnosis not present

## 2017-09-03 DIAGNOSIS — Z79899 Other long term (current) drug therapy: Secondary | ICD-10-CM | POA: Insufficient documentation

## 2017-09-03 DIAGNOSIS — Z008 Encounter for other general examination: Secondary | ICD-10-CM

## 2017-09-03 DIAGNOSIS — R109 Unspecified abdominal pain: Secondary | ICD-10-CM | POA: Diagnosis not present

## 2017-09-03 DIAGNOSIS — R1030 Lower abdominal pain, unspecified: Secondary | ICD-10-CM | POA: Diagnosis not present

## 2017-09-03 DIAGNOSIS — F329 Major depressive disorder, single episode, unspecified: Secondary | ICD-10-CM | POA: Diagnosis not present

## 2017-09-03 DIAGNOSIS — Z888 Allergy status to other drugs, medicaments and biological substances status: Secondary | ICD-10-CM | POA: Diagnosis not present

## 2017-09-03 DIAGNOSIS — Z905 Acquired absence of kidney: Secondary | ICD-10-CM | POA: Diagnosis not present

## 2017-09-03 DIAGNOSIS — G4700 Insomnia, unspecified: Secondary | ICD-10-CM | POA: Diagnosis not present

## 2017-09-03 DIAGNOSIS — S00412A Abrasion of left ear, initial encounter: Secondary | ICD-10-CM | POA: Diagnosis not present

## 2017-09-03 DIAGNOSIS — I129 Hypertensive chronic kidney disease with stage 1 through stage 4 chronic kidney disease, or unspecified chronic kidney disease: Secondary | ICD-10-CM | POA: Diagnosis not present

## 2017-09-03 DIAGNOSIS — F323 Major depressive disorder, single episode, severe with psychotic features: Secondary | ICD-10-CM | POA: Diagnosis not present

## 2017-09-03 DIAGNOSIS — G4733 Obstructive sleep apnea (adult) (pediatric): Secondary | ICD-10-CM | POA: Diagnosis not present

## 2017-09-03 DIAGNOSIS — R0989 Other specified symptoms and signs involving the circulatory and respiratory systems: Secondary | ICD-10-CM | POA: Diagnosis not present

## 2017-09-03 DIAGNOSIS — M1990 Unspecified osteoarthritis, unspecified site: Secondary | ICD-10-CM | POA: Diagnosis not present

## 2017-09-03 DIAGNOSIS — Q600 Renal agenesis, unilateral: Secondary | ICD-10-CM | POA: Diagnosis not present

## 2017-09-03 DIAGNOSIS — T148XXA Other injury of unspecified body region, initial encounter: Secondary | ICD-10-CM | POA: Diagnosis not present

## 2017-09-03 DIAGNOSIS — Z7902 Long term (current) use of antithrombotics/antiplatelets: Secondary | ICD-10-CM | POA: Diagnosis not present

## 2017-09-03 DIAGNOSIS — F419 Anxiety disorder, unspecified: Secondary | ICD-10-CM | POA: Diagnosis not present

## 2017-09-03 LAB — COMPREHENSIVE METABOLIC PANEL
ALT: 11 U/L — ABNORMAL LOW (ref 14–54)
AST: 18 U/L (ref 15–41)
Albumin: 3.3 g/dL — ABNORMAL LOW (ref 3.5–5.0)
Alkaline Phosphatase: 56 U/L (ref 38–126)
Anion gap: 11 (ref 5–15)
BUN: 9 mg/dL (ref 6–20)
CO2: 23 mmol/L (ref 22–32)
Calcium: 8.5 mg/dL — ABNORMAL LOW (ref 8.9–10.3)
Chloride: 110 mmol/L (ref 101–111)
Creatinine, Ser: 0.88 mg/dL (ref 0.44–1.00)
GFR calc Af Amer: >60 mL/min (ref >60)
GFR calc non Af Amer: >60 mL/min (ref >60)
Glucose, Bld: 91 mg/dL (ref 65–99)
Potassium: 3.3 mmol/L — ABNORMAL LOW (ref 3.5–5.1)
Sodium: 144 mmol/L (ref 135–145)
Total Bilirubin: 0.9 mg/dL (ref 0.3–1.2)
Total Protein: 6.1 g/dL — ABNORMAL LOW (ref 6.5–8.1)

## 2017-09-03 LAB — CBC WITH DIFFERENTIAL/PLATELET
Basophils Absolute: 0 10*3/uL (ref 0.0–0.1)
Basophils Relative: 0 %
Eosinophils Absolute: 0.1 10*3/uL (ref 0.0–0.7)
Eosinophils Relative: 2 %
HCT: 32.7 % — ABNORMAL LOW (ref 36.0–46.0)
Hemoglobin: 11.1 g/dL — ABNORMAL LOW (ref 12.0–15.0)
Lymphocytes Relative: 26 %
Lymphs Abs: 1.2 10*3/uL (ref 0.7–4.0)
MCH: 33.3 pg (ref 26.0–34.0)
MCHC: 33.9 g/dL (ref 30.0–36.0)
MCV: 98.2 fL (ref 78.0–100.0)
Monocytes Absolute: 0.4 10*3/uL (ref 0.1–1.0)
Monocytes Relative: 8 %
Neutro Abs: 2.9 10*3/uL (ref 1.7–7.7)
Neutrophils Relative %: 64 %
Platelets: 164 10*3/uL (ref 150–400)
RBC: 3.33 MIL/uL — ABNORMAL LOW (ref 3.87–5.11)
RDW: 14.7 % (ref 11.5–15.5)
WBC: 4.7 10*3/uL (ref 4.0–10.5)

## 2017-09-03 LAB — URINALYSIS, ROUTINE W REFLEX MICROSCOPIC
Bilirubin Urine: NEGATIVE
Glucose, UA: NEGATIVE mg/dL
Hgb urine dipstick: NEGATIVE
Ketones, ur: 5 mg/dL — AB
Leukocytes, UA: NEGATIVE
Nitrite: NEGATIVE
Protein, ur: NEGATIVE mg/dL
Specific Gravity, Urine: 1.013 (ref 1.005–1.030)
pH: 6 (ref 5.0–8.0)

## 2017-09-03 LAB — RAPID URINE DRUG SCREEN, HOSP PERFORMED
Amphetamines: NOT DETECTED
Barbiturates: NOT DETECTED
Benzodiazepines: NOT DETECTED
Cocaine: NOT DETECTED
Opiates: NOT DETECTED
Tetrahydrocannabinol: NOT DETECTED

## 2017-09-03 LAB — ACETAMINOPHEN LEVEL: Acetaminophen (Tylenol), Serum: <10 ug/mL — ABNORMAL LOW (ref 10–30)

## 2017-09-03 LAB — ETHANOL: Alcohol, Ethyl (B): <10 mg/dL (ref <10)

## 2017-09-03 LAB — TSH: TSH: 0.841 u[IU]/mL (ref 0.350–4.500)

## 2017-09-03 LAB — SEDIMENTATION RATE: Sed Rate: 30 mm/hr — ABNORMAL HIGH (ref 0–22)

## 2017-09-03 MED ORDER — LATANOPROST 0.005 % OP SOLN
1.0000 [drp] | Freq: Every day | OPHTHALMIC | Status: DC
  Filled 2017-09-03: qty 2.5

## 2017-09-03 MED ORDER — CITALOPRAM HYDROBROMIDE 10 MG PO TABS
20.0000 mg | ORAL_TABLET | Freq: Every day | ORAL | Status: DC
  Administered 2017-09-03: 20 mg via ORAL
  Filled 2017-09-03: qty 2

## 2017-09-03 MED ORDER — VITAMIN D3 25 MCG (1000 UNIT) PO TABS
1000.0000 [IU] | ORAL_TABLET | Freq: Every day | ORAL | Status: DC
  Administered 2017-09-03: 1000 [IU] via ORAL
  Filled 2017-09-03: qty 1

## 2017-09-03 MED ORDER — DORZOLAMIDE HCL 2 % OP SOLN
1.0000 [drp] | Freq: Two times a day (BID) | OPHTHALMIC | Status: DC
  Administered 2017-09-03: 1 [drp] via OPHTHALMIC
  Filled 2017-09-03: qty 10

## 2017-09-03 MED ORDER — ACETAMINOPHEN 325 MG PO TABS: 650.0000 mg | ORAL_TABLET | ORAL | Status: DC | PRN

## 2017-09-03 MED ORDER — TRAZODONE HCL 100 MG PO TABS: 100.0000 mg | ORAL_TABLET | Freq: Every day | ORAL | Status: DC

## 2017-09-03 MED ORDER — ASPIRIN EC 81 MG PO TBEC
81.0000 mg | DELAYED_RELEASE_TABLET | Freq: Every day | ORAL | Status: DC
  Administered 2017-09-03: 81 mg via ORAL
  Filled 2017-09-03: qty 1

## 2017-09-03 MED ORDER — CYCLOSPORINE 0.05 % OP EMUL
1.0000 [drp] | Freq: Three times a day (TID) | OPHTHALMIC | Status: DC
  Administered 2017-09-03 (×2): 1 [drp] via OPHTHALMIC
  Filled 2017-09-03 (×2): qty 1

## 2017-09-03 MED ORDER — METOPROLOL TARTRATE 25 MG PO TABS
12.5000 mg | ORAL_TABLET | Freq: Two times a day (BID) | ORAL | Status: DC
  Administered 2017-09-03: 12.5 mg via ORAL
  Filled 2017-09-03: qty 1

## 2017-09-03 MED ORDER — ADULT MULTIVITAMIN W/MINERALS CH
1.0000 | ORAL_TABLET | Freq: Every day | ORAL | Status: DC
  Administered 2017-09-03: 1 via ORAL
  Filled 2017-09-03: qty 1

## 2017-09-03 MED ORDER — CLOPIDOGREL BISULFATE 75 MG PO TABS
75.0000 mg | ORAL_TABLET | Freq: Every morning | ORAL | Status: DC
  Administered 2017-09-03: 75 mg via ORAL
  Filled 2017-09-03: qty 1

## 2017-09-03 MED ORDER — MIRTAZAPINE 30 MG PO TABS: 15.0000 mg | ORAL_TABLET | Freq: Every day | ORAL | Status: DC

## 2017-09-03 MED ORDER — TRAMADOL HCL 50 MG PO TABS: 100.0000 mg | ORAL_TABLET | Freq: Two times a day (BID) | ORAL | Status: DC | PRN

## 2017-09-03 MED ORDER — HYDROXYZINE HCL 25 MG PO TABS
25.0000 mg | ORAL_TABLET | Freq: Once | ORAL | Status: AC
  Administered 2017-09-03: 25 mg via ORAL
  Filled 2017-09-03: qty 1

## 2017-09-03 MED ORDER — POTASSIUM CHLORIDE CRYS ER 20 MEQ PO TBCR
40.0000 meq | EXTENDED_RELEASE_TABLET | Freq: Once | ORAL | Status: AC
  Administered 2017-09-03: 40 meq via ORAL
  Filled 2017-09-03: qty 2

## 2017-09-03 MED ORDER — PANTOPRAZOLE SODIUM 20 MG PO TBEC
20.0000 mg | DELAYED_RELEASE_TABLET | Freq: Every day | ORAL | Status: DC
  Administered 2017-09-03: 20 mg via ORAL
  Filled 2017-09-03: qty 1

## 2017-09-03 MED ORDER — LAMOTRIGINE 25 MG PO TABS
50.0000 mg | ORAL_TABLET | Freq: Two times a day (BID) | ORAL | Status: DC
  Administered 2017-09-03: 50 mg via ORAL
  Filled 2017-09-03: qty 2

## 2017-09-03 MED ORDER — LOPERAMIDE HCL 2 MG PO CAPS: 4.0000 mg | ORAL_CAPSULE | ORAL | Status: DC | PRN

## 2017-09-03 NOTE — ED Notes (Signed)
[facility] here to transport patient. Patient belongings given to transporters

## 2017-09-03 NOTE — BH Assessment (Signed)
[facility] Assessment Progress Note  Case was staffed with Romilda Garret FNP who recommended a inpatient (Geropsychiatry) admission as appropriate bed placement is investigated.

## 2017-09-03 NOTE — ED Triage Notes (Signed)
Pt BIB EMS from home with complaints of abdominal pain and itching all over on the inside. Patient seen multiple times for same. Sent by daughter for possible psych evaluation. Patient is A&O.

## 2017-09-03 NOTE — ED Notes (Signed)
Bladder scan showed 217 mL

## 2017-09-03 NOTE — BH Assessment (Signed)
[facility] Assessment Progress Note  Per Ethelene Hal, FNP, this pt requires psychiatric hospitalization at this time.  The following facilities have been contacted to seek placement for this pt, with results as noted:  Beds available, information sent, decision pending:  [facility]:  [facility] (due to need for assistance with ADL's)   At capacity:  Sheryle Hail [place], [place] Coordinator [phone]

## 2017-09-03 NOTE — BH Assessment (Signed)
[facility] Assessment Progress Note  Per Ethelene Hal, FNP, this pt requires psychiatric hospitalization at this time.  At 16:25 Shirlee Limerick calls from [facility].  Pt has been accepted to their facility by Dr Geanie Kenning.  Margarita Grizzle, concurs with this disposition, as does the pt who is currently under voluntary status.  Pt has signed the [facility] consent for admission form, which has been faxed back to [facility].  Pt's nurse, Lala Lund, has been notified, and agrees to call report to [phone].  Pt is to be transported via [facility], along with original signed form.  Pt has also signed Consent to Release Information to her daughter, [name] [phone]), and asks this writer to call her regarding disposition.  I placed the call at 16:35, speaking directly to Ms Geoffry Paradise, providing her with the phone number for the TCU nursing station, and for the behavioral health unit at [facility].  Jalene Mullet, [place] Coordinator [phone]

## 2017-09-03 NOTE — ED Notes (Signed)
Patient wanded by security and changed out

## 2017-09-03 NOTE — ED Provider Notes (Addendum)
[facility] DEPT Provider Note   CSN: [id number] Arrival date & time: 09/03/17  7371     History   Chief Complaint Chief Complaint  Patient presents with  . Abdominal Pain  . Pruritis    HPI [name] [name] is a 77 y.o. female.  HPI Patient reports that she got up to go to the bathroom this morning.  She reports she has some lower abdominal discomfort and for no reason that she can identify she immediately started to get very frightened.  She reports that she had a panic attack.  Patient advises that she has been having a lot of familial problems at home with her daughter who lives with her.  This has been going on for a number of years but, despite treatment, the patient reports that things are getting worse again and she has often been wishing that she "was not around anymore" (meaning that she, the patient would just die).  She reports she recognizes how wrong it is to kill herself, but she frequently wishes that she did not have to go on living.  She reports 2 years ago she spent a couple of months at [facility] and improved.  She reports once back home with her family, things were manageable but, despite going to group sessions weekly at [facility], problems with anxiety attacks and her relationship with her daughter and granddaughter are worsening significantly.  Patient has lived in her trailer home since [birth year].  Her daughter moved in with her about 8 years ago, "supposedly to help take care of me".  The patient reports that several years ago she started to develop problems with itching which she perceived to be dryness in her throat and abdominal irritation and dry gritty feeling of her skin.  She believes it is some type of allergen that is in the trailer.  She recognizes that that she lived in it for many years without having this problem but now it only seems to happen when she is at home.  Her daughter has tried repainting and redoing the floors and ceiling but  it has not helped.  This is been a real point of contention because, the patient reports that now her daughter tells the patient it is all in her head and the patient reports her daughter has co-opted her granddaughter and her other daughter into telling her it's in her head.  She reports her relationships have gotten very strained and now her daughter and granddaughter ignore her in the home.  She reports she has been "shut out".  Patient reports that most of the problems are relating to the situation.  She reports she does have physical symptoms that are troublesome for her but none are acute to this visit.  She reports she deals with chronic general weakness, skin dryness and itching and recurrent lower abdominal discomfort since her diagnosis of UTI. Past Medical History:  Diagnosis Date  . Anxiety   . Arthritis    RA  . Carpal tunnel syndrome, bilateral   . Chronic kidney disease    CKD stage III, absence of left kidney  . Congenital absence of one kidney    Pt has right kidney only  . Depression   . GERD (gastroesophageal reflux disease)   . Headache   . Heart murmur   . Hypercholesteremia   . Hypertension   . Peripheral vascular disease ([place])    a. s/p R external iliac stent '06 with angioplasty '13 (Dr. Benjamine Sprague) b. 01/2016:  s/p PTA/stenting in L common iliac artery (Dr. Gwenlyn Found)  . Polymyalgia rheumatica ([place])   . Psoriasis     Patient Active Problem List   Diagnosis Date Noted  . Otitis media 08/26/2017  . Leg wound, right 08/26/2017  . Need for immunization against influenza 03/15/2017  . Acute renal failure ([place]) 01/25/2017  . Major depressive disorder, recurrent severe without psychotic features ([place]) 09/21/2016  . Peripheral vascular disease ([place])   . Claudication ([place]) 01/31/2016  . Right knee pain 10/18/2015  . Acrochordon 08/03/2015  . Stasis dermatitis of both legs 01/18/2015  . Radiculopathy of lumbar region 01/06/2015  . Seborrheic keratosis 11/24/2014  . Diarrhea    . OSA (obstructive sleep apnea) 09/21/2014  . Osteoporosis 06/29/2014  . Thyroid nodule 06/29/2014  . Breast calcifications 06/29/2014  . Glaucoma 01/08/2014  . Trigeminal neuralgia 12/18/2013  . Basal cell carcinoma 11/12/2013  . Presbyopia 10/22/2013  . Resting tremor 10/14/2013  . Lumbar back pain with radiculopathy affecting right lower extremity 09/11/2013  . Polymyalgia rheumatica ([place]) 09/11/2013  . GERD (gastroesophageal reflux disease) 09/11/2013  . Pulmonary nodules 05/24/2012  . Carotid bruit 04/30/2012  . Hypertension 04/27/2012  . Anxiety and depression 04/27/2012  . Hyperlipemia 04/27/2012  . Cervical spondylosis with radiculopathy 04/19/2012    Past Surgical History:  Procedure Laterality Date  . ABDOMINAL HYSTERECTOMY  1963   Partial,  Due to bleeding after delivery  . ANTERIOR CERVICAL DECOMP/DISCECTOMY FUSION  04/19/2012   Procedure: ANTERIOR CERVICAL DECOMPRESSION/DISCECTOMY FUSION 2 LEVELS;  Surgeon: Winfield Cunas, MD;  Location: [facility];  Service: Neurosurgery;  Laterality: N/A;  Cervical four-five,Cervical five-six anterior cervical decompression with fusion plating and bonegraft possible posterior cervical decompression  . APPENDECTOMY    . CARPAL TUNNEL RELEASE  1990  . CERVICAL FUSION  04/19/2012  . EYE SURGERY    . ILIAC ARTERY STENT    . PERIPHERAL VASCULAR CATHETERIZATION N/A 01/31/2016   Procedure: Abdominal Aortogram;  Surgeon: [name], MD;  Location: [facility] CV LAB;  Service: Cardiovascular;  Laterality: N/A;  . PERIPHERAL VASCULAR CATHETERIZATION Bilateral 01/31/2016   Procedure: Lower Extremity Angiography;  Surgeon: [name], MD;  Location: [facility];  Service: Cardiovascular;  Laterality: Bilateral;  . PERIPHERAL VASCULAR CATHETERIZATION Left 01/31/2016   Procedure: Peripheral Vascular Intervention;  Surgeon: [name], MD;  Location: [facility] CV LAB;  Service: Cardiovascular;  Laterality: Left;  ILIAC   . TONSILLECTOMY      OB History    Gravida Para Term Preterm AB Living   3 3       2    SAB TAB Ectopic Multiple Live Births                   Home Medications    Prior to Admission medications   Medication Sig Start Date End Date Taking? Authorizing Provider  acetaminophen (TYLENOL) 500 MG tablet Take 1,000 mg by mouth every 6 (six) hours as needed for moderate pain or headache.   Yes [provider]  aspirin EC 81 MG EC tablet Take 1 tablet (81 mg total) by mouth daily. 02/01/16  Yes Eileen Stanford, PA-C  cholecalciferol (VITAMIN D) 1000 units tablet Take 1,000 Units by mouth daily.   Yes [provider]  citalopram (CELEXA) 20 MG tablet Take 1 tablet (20 mg total) by mouth daily. 08/28/16  Yes McKeag, Marylynn Pearson, MD  clopidogrel (PLAVIX) 75 MG tablet TAKE ONE TABLET BY MOUTH IN THE MORNING  05/02/17  Yes [name], MD  cycloSPORINE (RESTASIS) 0.05 % ophthalmic emulsion Place 1 drop into both eyes 3 (three) times daily.   Yes [provider]  dorzolamide (TRUSOPT) 2 % ophthalmic solution Place 1 drop into both eyes 2 (two) times daily.  07/26/15  Yes [provider]  lamoTRIgine (LAMICTAL) 25 MG tablet Take 2 tablets (50 mg total) by mouth daily. Patient taking differently: Take 50 mg by mouth 2 (two) times daily.  11/03/16 11/03/17 Yes Dorsey, Donette Larry, MD  latanoprost (XALATAN) 0.005 % ophthalmic solution Place 1 drop into both eyes at bedtime. 08/29/17  Yes [provider]  loperamide (IMODIUM) 2 MG capsule Take 4 mg by mouth as needed for diarrhea or loose stools.   Yes [provider]  metoprolol tartrate (LOPRESSOR) 25 MG tablet Take 0.5 tablets (12.5 mg total) by mouth 2 (two) times daily. 03/16/17  Yes Lind Covert, MD  mirtazapine (REMERON) 15 MG tablet Take 15 mg by mouth at bedtime.  05/13/17  Yes [provider]  Multiple Vitamin (MULTIVITAMIN WITH MINERALS) TABS tablet Take 1 tablet by mouth daily.   Yes  [provider]  pantoprazole (PROTONIX) 20 MG tablet TAKE ONE TABLET BY MOUTH DAILY 08/29/17  Yes [name], MD  traMADol (ULTRAM) 50 MG tablet Take 2 tablets (100 mg total) by mouth 2 (two) times daily as needed for moderate pain. 08/10/17  Yes Eloise Levels, MD  traZODone (DESYREL) 100 MG tablet Take 100 mg by mouth at bedtime.  04/18/17  Yes [provider]  amoxicillin-clavulanate (AUGMENTIN) 875-125 MG tablet Take 1 tablet by mouth 2 (two) times daily. Patient not taking: Reported on 09/03/2017 08/21/17   [name], MD  diclofenac sodium (VOLTAREN) 1 % GEL Apply 4 g topically 4 (four) times daily. Patient not taking: Reported on 07/31/2017 06/14/17   [name], MD    Family History Family History  Problem Relation Age of Onset  . Angina Mother   . Heart attack Father   . Melanoma Brother   . Cancer Sister        Lymphoma    Social History Social History   Tobacco Use  . Smoking status: Former Smoker    Packs/day: 1.00    Years: 40.00    Pack years: 40.00    Types: Cigarettes    Last attempt to quit: 07/24/2010    Years since quitting: 7.1  . Smokeless tobacco: Never Used  . Tobacco comment: STARTED BACK JULY 2013 AND RECENTLY QUIT 03-10-2012  Substance Use Topics  . Alcohol use: No    Alcohol/week: 0.0 oz    Comment: QUIT IN 1999  . Drug use: No     Allergies   Gabapentin and Statins   Review of Systems Review of Systems 10 Systems reviewed and are negative for acute change except as noted in the HPI.   Physical Exam Updated Vital Signs BP (!) 170/76   Pulse 65   Temp 98.8 F (37.1 C) (Oral)   Resp 18   SpO2 97%   Physical Exam  Constitutional: She is oriented to person, place, and time. She appears well-developed and well-nourished. No distress.  Patient is alert and nontoxic.  No respiratory distress.  Clinically well in appearance.  HENT:  Head: Normocephalic and atraumatic.  Mouth/Throat: Oropharynx is clear and  moist.  Slight dryness and cracking of the lips.  Oral mucous membranes are moist.  Eyes: Conjunctivae and EOM are normal. Pupils are equal, round,  and reactive to light.  Neck: Neck supple.  Cardiovascular: Normal rate, regular rhythm, normal heart sounds and intact distal pulses.  No murmur heard. Pulmonary/Chest: Effort normal and breath sounds normal. No respiratory distress.  Abdominal: Soft. She exhibits no distension. There is tenderness.  Mild diffuse lower abdominal discomfort to palpation.  No guarding.  Musculoskeletal: Normal range of motion. She exhibits edema.  Patient has hyperpigmentation bilateral lower extremities.  She has a Band-Aid over a wound to the lateral right lower leg.  This is healing well.  There is no surrounding cellulitis.  She has significant skin thinning of the lower extremities.  No significant edema.  Neurological: She is alert and oriented to person, place, and time. No cranial nerve deficit. She exhibits normal muscle tone. Coordination normal.  Skin: Skin is warm and dry.  Skin has general dryness and actinic keratoses.  No erythematous, vesicular or pustular rashes.  Psychiatric:  Patient is alert and appropriately interactive.  She becomes tearful at times discussing emotional subjects.  Cognitive function is normal.  Nursing note and vitals reviewed.    ED Treatments / Results  Labs (all labs ordered are listed, but only abnormal results are displayed) Labs Reviewed  COMPREHENSIVE METABOLIC PANEL - Abnormal; Notable for the following components:      Result Value   Potassium 3.3 (*)    Calcium 8.5 (*)    Total Protein 6.1 (*)    Albumin 3.3 (*)    ALT 11 (*)    All other components within normal limits  CBC WITH DIFFERENTIAL/PLATELET - Abnormal; Notable for the following components:   RBC 3.33 (*)    Hemoglobin 11.1 (*)    HCT 32.7 (*)    All other components within normal limits  ACETAMINOPHEN LEVEL - Abnormal; Notable for the  following components:   Acetaminophen (Tylenol), Serum <10 (*)    All other components within normal limits  URINALYSIS, ROUTINE W REFLEX MICROSCOPIC - Abnormal; Notable for the following components:   Ketones, ur 5 (*)    All other components within normal limits  SEDIMENTATION RATE - Abnormal; Notable for the following components:   Sed Rate 30 (*)    All other components within normal limits  ETHANOL  RAPID URINE DRUG SCREEN, HOSP PERFORMED  TSH    EKG  EKG Interpretation  Date/Time:  Monday September 03 2017 14:07:19 EST Ventricular Rate:  67 PR Interval:  148 QRS Duration: 74 QT Interval:  426 QTC Calculation: 450 R Axis:   65 Text Interpretation:  Normal sinus rhythm Normal ECG normal. no change from previous Confirmed by [name] [id number]) on 09/03/2017 3:52:03 PM       Radiology Dg Chest 1 View  Result Date: 09/03/2017 CLINICAL DATA:  Psychiatric evaluation, history hypertension, former smoker, chronic kidney disease stage III with congenital absence of LEFT kidney, GERD EXAM: CHEST 1 VIEW COMPARISON:  Portable exam 1249 hours compared to 08/26/2016 FINDINGS: Normal heart size, mediastinal contours, and pulmonary vascularity. Atherosclerotic calcification aorta. Minimal bibasilar atelectasis. Lungs otherwise clear. Minimal chronic peribronchial thickening noted. No pleural effusion or pneumothorax. Bones demineralized with evidence of prior cervical spine fusion. IMPRESSION: Minimal chronic bronchitic changes and bibasilar atelectasis. Electronically Signed   By: Lavonia Dana M.D.   On: 09/03/2017 13:10    Procedures Procedures (including critical care time)  Medications Ordered in ED Medications  acetaminophen (TYLENOL) tablet 650 mg (not administered)  aspirin EC tablet 81 mg (81 mg Oral Given 09/03/17 1037)  cholecalciferol (VITAMIN D)  tablet 1,000 Units (1,000 Units Oral Given 09/03/17 1039)  citalopram (CELEXA) tablet 20 mg (20 mg Oral Given 09/03/17 1038)    clopidogrel (PLAVIX) tablet 75 mg (75 mg Oral Given 09/03/17 1039)  cycloSPORINE (RESTASIS) 0.05 % ophthalmic emulsion 1 drop (1 drop Both Eyes Given 09/03/17 1039)  dorzolamide (TRUSOPT) 2 % ophthalmic solution 1 drop (1 drop Both Eyes Given 09/03/17 1039)  lamoTRIgine (LAMICTAL) tablet 50 mg (50 mg Oral Given 09/03/17 1038)  latanoprost (XALATAN) 0.005 % ophthalmic solution 1 drop (not administered)  loperamide (IMODIUM) capsule 4 mg (not administered)  metoprolol tartrate (LOPRESSOR) tablet 12.5 mg (12.5 mg Oral Given 09/03/17 1038)  mirtazapine (REMERON) tablet 15 mg (not administered)  multivitamin with minerals tablet 1 tablet (1 tablet Oral Given 09/03/17 1038)  pantoprazole (PROTONIX) EC tablet 20 mg (20 mg Oral Given 09/03/17 1039)  traMADol (ULTRAM) tablet 100 mg (not administered)  traZODone (DESYREL) tablet 100 mg (not administered)  potassium chloride SA (K-DUR,KLOR-CON) CR tablet 40 mEq (40 mEq Oral Given 09/03/17 0918)  hydrOXYzine (ATARAX/VISTARIL) tablet 25 mg (25 mg Oral Given 09/03/17 1038)     Initial Impression / Assessment and Plan / ED Course  I have reviewed the triage vital signs and the nursing notes.  Pertinent labs & imaging results that were available during my care of the patient were reviewed by me and considered in my medical decision making (see chart for details).     Final Clinical Impressions(s) / ED Diagnoses   Final diagnoses:  Severe episode of recurrent major depressive disorder, without psychotic features ([place])  Passive suicidal ideations  As per above patient has been having severe problems at home with major depression, anxiety and panic attack and the passive suicidal ideation.  Clinically, patient is well in appearance.  Medical problems have been chronic and at this time are not active.  It is unclear if patient has some subacute allergic reaction that occurs in her trailer home.  This is very frustrating for the patient because she reports she  cannot tell anymore if her symptoms are real or because of her anxiety and depression.  Patient is medically cleared for psychiatric treatment.  ED Discharge Orders    None       [name], MD 09/03/17 [id number]    [name], MD 09/03/17 [phone]

## 2017-09-03 NOTE — ED Notes (Signed)
Bed: WA28 Expected date:  Expected time:  Means of arrival:  Comments: 

## 2017-09-03 NOTE — BH Assessment (Addendum)
Assessment Note  [name] [name] is an 77 y.o. female that presents this date with thoughts of self harm. Patient denies any plan/intent but states if she "had to go back to her home she would hurt herself." Patient is vague in reference to plan or intent although reports one prior attempt at self harm in March 2018 when she drove her car on the railroad tracks although reported "the train never came." Patient is observed to be very tearful and speaks in a slow/soft voice. Patient reports ongoing depression with symptoms to include: excessive sadness, feeling worthless and guilt. Patient reports decreased sleep over the last week stating she has been "doubling her medication" to sleep stating she sleeps less than 4 hours a night for the last week. Patient cannot recall the names of those medication/s but reports she has been receiving services form [facility] who assists with medication management. Patient reports she last saw that provider three weeks ago for medication adjustments. Patient's daughter is present during assessment (with patient's permission) [name] [name] [phone] who renders collateral information. Patient's daughter currently resides with patient who assist with care. Per notes: "Patient reports that she got up to go to the bathroom this morning. She reports she has some lower abdominal discomfort and for no reason that she can identify she immediately started to get very frightened. She reports that she had a panic attack. Patient advises that she has been having a lot of familial problems at home with her daughter who lives with her. This has been going on for a number of years but, despite treatment, the patient reports that things are getting worse again and she has often been wishing that she "was not around anymore" (meaning that she, the patient would just die). She reports she recognizes how wrong it is to kill herself, but she frequently wishes that she did not have to go on living. She  reports 2 years ago she spent a couple of months at [facility] and improved. She reports once back home with her family, things were manageable but, despite going to group sessions weekly at [facility], problems with anxiety attacks and her relationship with her daughter and granddaughter are worsening significantly. Patient has lived in her trailer home since 41. Her daughter moved in with her about 8 years ago, "supposedly to help take care of me". The patient reports that several years ago she started to develop problems with itching which she perceived to be dryness in her throat and abdominal irritation and dry gritty feeling of her skin. She reports her relationships have gotten very strained and now her daughter and granddaughter ignore her in the home. She reports she has been "shut out."Patient reports that most of the problems are relating to the situation. She reports she does have physical symptoms that are troublesome for her but none are acute to this visit." Patient has a history of alcohol use but denies current use. Patient is oriented to place only and is observed to be tearful and distraught during assessment. Case was staffed with Romilda Garret FNP who recommended a inpatient (Geropsychiatry) admission as appropriate bed placement is investigated.         Diagnosis: F33.2 MDD recurrent severe without psychotic features.   Past Medical History:  Past Medical History:  Diagnosis Date  . Anxiety   . Arthritis    RA  . Carpal tunnel syndrome, bilateral   . Chronic kidney disease    CKD stage III, absence of left kidney  . Congenital  absence of one kidney    Pt has right kidney only  . Depression   . GERD (gastroesophageal reflux disease)   . Headache   . Heart murmur   . Hypercholesteremia   . Hypertension   . Peripheral vascular disease ([place])    a. s/p R external iliac stent '06 with angioplasty '13 (Dr. Benjamine Sprague) b. 01/2016: s/p PTA/stenting in L common iliac artery (Dr. Gwenlyn Found)  .  Polymyalgia rheumatica ([place])   . Psoriasis     Past Surgical History:  Procedure Laterality Date  . ABDOMINAL HYSTERECTOMY  1963   Partial,  Due to bleeding after delivery  . ANTERIOR CERVICAL DECOMP/DISCECTOMY FUSION  04/19/2012   Procedure: ANTERIOR CERVICAL DECOMPRESSION/DISCECTOMY FUSION 2 LEVELS;  Surgeon: Winfield Cunas, MD;  Location: [place] [facility];  Service: Neurosurgery;  Laterality: N/A;  Cervical four-five,Cervical five-six anterior cervical decompression with fusion plating and bonegraft possible posterior cervical decompression  . APPENDECTOMY    . CARPAL TUNNEL RELEASE  1990  . CERVICAL FUSION  04/19/2012  . EYE SURGERY    . ILIAC ARTERY STENT    . PERIPHERAL VASCULAR CATHETERIZATION N/A 01/31/2016   Procedure: Abdominal Aortogram;  Surgeon: [name], MD;  Location: [place] CV LAB;  Service: Cardiovascular;  Laterality: N/A;  . PERIPHERAL VASCULAR CATHETERIZATION Bilateral 01/31/2016   Procedure: Lower Extremity Angiography;  Surgeon: [name], MD;  Location: [place] CV LAB;  Service: Cardiovascular;  Laterality: Bilateral;  . PERIPHERAL VASCULAR CATHETERIZATION Left 01/31/2016   Procedure: Peripheral Vascular Intervention;  Surgeon: [name], MD;  Location: [place] CV LAB;  Service: Cardiovascular;  Laterality: Left;  ILIAC  . TONSILLECTOMY      Family History:  Family History  Problem Relation Age of Onset  . Angina Mother   . Heart attack Father   . Melanoma Brother   . Cancer Sister        Lymphoma    Social History:  reports that she quit smoking about 7 years ago. Her smoking use included cigarettes. She has a 40.00 pack-year smoking history. she has never used smokeless tobacco. She reports that she does not drink alcohol or use drugs.  Additional Social History:  Alcohol / Drug Use Pain Medications: pt denies abuse - see pta meds list Prescriptions: pt denies abuse - see pta meds list Over the Counter: pt denies abuse - see  pta meds list History of alcohol / drug use?: Yes Longest period of sobriety (when/how long): Currently on going  Negative Consequences of Use: (Denies) Withdrawal Symptoms: (Denies) Substance #1 Name of Substance 1: Alcohol 1 - Age of First Use: Unknown 1 - Amount (size/oz): Pt states she is currently maintaining her sobriety 1 - Frequency: Pt states she is currently maintaining her sobriety  1 - Duration: Unknown  1 - Last Use / Amount: Pt states "years ago"  CIWA: CIWA-Ar BP: (!) 170/76 Pulse Rate: 65 COWS:    Allergies:  Allergies  Allergen Reactions  . Gabapentin Itching    Was deleted since patient was taking, but now experiencing itching again.   . Statins Swelling, Rash and Other (See Comments)    Swelling involving tongue; also causes muscle pain    Home Medications:  (Not in a hospital admission)  OB/GYN Status:  No LMP recorded. Patient has had a hysterectomy.  General Assessment Data Location of Assessment: [facility] TTS Assessment: In system Is this a Tele or Face-to-Face Assessment?: Face-to-Face Is this an Initial Assessment or a  Re-assessment for this encounter?: Initial Assessment Marital status: Single Maiden name: NA Is patient pregnant?: No Pregnancy Status: No Living Arrangements: Other relatives Can pt return to current living arrangement?: Yes Admission Status: Voluntary Is patient capable of signing voluntary admission?: Yes Referral Source: Self/Family/Friend Insurance type: Medicare  Medical Screening Exam ([place]) Medical Exam completed: Yes  Crisis Care Plan Living Arrangements: Other relatives Legal Guardian: (NA) Name of Psychiatrist: [place] Name of Therapist: [facility]  Education Status Is patient currently in school?: No Current Grade: (NA) Highest grade of school patient has completed: (NA) Name of school: (NA) Contact person: (NA)  Risk to self with the past 6 months Suicidal Ideation: Yes-Currently Present Has  patient been a risk to self within the past 6 months prior to admission? : No Suicidal Intent: No Has patient had any suicidal intent within the past 6 months prior to admission? : No Is patient at risk for suicide?: Yes Suicidal Plan?: No Has patient had any suicidal plan within the past 6 months prior to admission? : No Access to Means: No What has been your use of drugs/alcohol within the last 12 months?: Denies current use Previous Attempts/Gestures: No How many times?: 1 Other Self Harm Risks: NA Triggers for Past Attempts: Other (Comment)(Family stress) Intentional Self Injurious Behavior: None Family Suicide History: No Recent stressful life event(s): Other (Comment)(Family stress) Persecutory voices/beliefs?: No Depression: Yes Depression Symptoms: Feeling angry/irritable Substance abuse history and/or treatment for substance abuse?: No Suicide prevention information given to non-admitted patients: Not applicable  Risk to Others within the past 6 months Homicidal Ideation: No Does patient have any lifetime risk of violence toward others beyond the six months prior to admission? : No Thoughts of Harm to Others: No Current Homicidal Intent: No Current Homicidal Plan: No Access to Homicidal Means: No Identified Victim: NA History of harm to others?: No Assessment of Violence: None Noted Violent Behavior Description: NA Does patient have access to weapons?: No Criminal Charges Pending?: No Does patient have a court date: No Is patient on probation?: No  Psychosis Hallucinations: Auditory Delusions: None noted  Mental Status Report Appearance/Hygiene: In scrubs Eye Contact: Good Motor Activity: Freedom of movement Speech: Soft, Slow Level of Consciousness: Crying Mood: Depressed, Anxious Affect: Appropriate to circumstance Anxiety Level: Moderate Thought Processes: Thought Blocking Judgement: Unimpaired Orientation: Place Obsessive Compulsive  Thoughts/Behaviors: None  Cognitive Functioning Concentration: Decreased Memory: Recent Intact IQ: Average Insight: Fair Impulse Control: Fair Appetite: Poor Weight Loss: 0 Weight Gain: 0 Sleep: Decreased Total Hours of Sleep: 2 Vegetative Symptoms: None  ADLScreening [facility] Assessment Services) Patient's cognitive ability adequate to safely complete daily activities?: Yes Patient able to express need for assistance with ADLs?: Yes Independently performs ADLs?: No  Prior Inpatient Therapy Prior Inpatient Therapy: Yes Prior Therapy Dates: 2018 Prior Therapy Facilty/Provider(s): [facility] Reason for Treatment: MH issues  Prior Outpatient Therapy Prior Outpatient Therapy: Yes Prior Therapy Dates: 2018 Prior Therapy Facilty/Provider(s): [facility] Reason for Treatment: Med mang Does patient have an ACCT team?: No Does patient have Intensive In-House Services?  : No Does patient have [facility] services? : Yes Does patient have P4CC services?: No  ADL Screening (condition at time of admission) Patient's cognitive ability adequate to safely complete daily activities?: Yes Is the patient deaf or have difficulty hearing?: No Does the patient have difficulty seeing, even when wearing glasses/contacts?: No Does the patient have difficulty concentrating, remembering, or making decisions?: No Patient able to express need for assistance with ADLs?: Yes Does the  patient have difficulty dressing or bathing?: No Independently performs ADLs?: No Communication: Independent with device (comment) Dressing (OT): Needs assistance Is this a change from baseline?: Pre-admission baseline Grooming: Independent with device (comment) Feeding: Independent with device (comment) Bathing: Needs assistance Is this a change from baseline?: Pre-admission baseline Toileting: Independent with device (comment) In/Out Bed: Independent with device (comment) Walks in Home: Independent with device (comment) Does  the patient have difficulty walking or climbing stairs?: Yes Weakness of Legs: Both Weakness of Arms/Hands: Both  Home Assistive Devices/Equipment Home Assistive Devices/Equipment: None  Therapy Consults (therapy consults require a physician order) PT Evaluation Needed: No OT Evalulation Needed: No SLP Evaluation Needed: No Abuse/Neglect Assessment (Assessment to be complete while patient is alone) Physical Abuse: Denies Verbal Abuse: Denies Sexual Abuse: Denies Exploitation of patient/patient's resources: Denies Self-Neglect: Denies Values / Beliefs Cultural Requests During Hospitalization: None Spiritual Requests During Hospitalization: None Consults Spiritual Care Consult Needed: No Social Work Consult Needed: No Regulatory affairs officer (For Healthcare) Does Patient Have a Medical Advance Directive?: No Would patient like information on creating a medical advance directive?: No - Patient declined    Additional Information 1:1 In Past 12 Months?: No CIRT Risk: No Elopement Risk: No Does patient have medical clearance?: Yes     Disposition: Case was staffed with Romilda Garret FNP who recommended a inpatient (Geropsychiatry) admission as appropriate bed placement is investigated.       Disposition Initial Assessment Completed for this Encounter: Yes Disposition of Patient: Inpatient treatment program Type of inpatient treatment program: Adult  On Site Evaluation by:   Reviewed with Physician:    Mamie Nick 09/03/2017 1:04 PM

## 2017-09-03 NOTE — ED Notes (Signed)
Report given to [facility]

## 2017-09-03 NOTE — ED Notes (Signed)
Bed: DQ22 Expected date:  Expected time:  Means of arrival:  Comments: 76 f abdominal pain/itching

## 2017-09-05 DIAGNOSIS — Q600 Renal agenesis, unilateral: Secondary | ICD-10-CM | POA: Insufficient documentation

## 2017-09-12 DIAGNOSIS — M545 Low back pain: Secondary | ICD-10-CM | POA: Diagnosis not present

## 2017-09-12 DIAGNOSIS — R262 Difficulty in walking, not elsewhere classified: Secondary | ICD-10-CM | POA: Diagnosis not present

## 2017-09-12 DIAGNOSIS — M6281 Muscle weakness (generalized): Secondary | ICD-10-CM | POA: Diagnosis not present

## 2017-09-12 DIAGNOSIS — M5442 Lumbago with sciatica, left side: Secondary | ICD-10-CM | POA: Diagnosis not present

## 2017-09-12 DIAGNOSIS — M5187 Other intervertebral disc disorders, lumbosacral region: Secondary | ICD-10-CM | POA: Diagnosis not present

## 2017-09-12 DIAGNOSIS — M4727 Other spondylosis with radiculopathy, lumbosacral region: Secondary | ICD-10-CM | POA: Diagnosis not present

## 2017-09-12 DIAGNOSIS — I158 Other secondary hypertension: Secondary | ICD-10-CM | POA: Diagnosis not present

## 2017-09-12 DIAGNOSIS — F418 Other specified anxiety disorders: Secondary | ICD-10-CM | POA: Diagnosis not present

## 2017-09-12 MED ORDER — GENERIC EXTERNAL MEDICATION
Status: DC
Start: 2017-09-10 — End: 2017-09-12

## 2017-09-12 MED ORDER — TRAMADOL HCL 50 MG PO TABS
100.00 | ORAL_TABLET | ORAL | Status: DC
Start: ? — End: 2017-09-12

## 2017-09-12 MED ORDER — FLUTICASONE PROPIONATE 50 MCG/ACT NA SUSP
NASAL | Status: DC
Start: 2017-09-11 — End: 2017-09-12

## 2017-09-12 MED ORDER — ACETAMINOPHEN 325 MG PO TABS
650.00 | ORAL_TABLET | ORAL | Status: DC
Start: ? — End: 2017-09-12

## 2017-09-12 MED ORDER — OSELTAMIVIR PHOSPHATE 30 MG PO CAPS
30.00 | ORAL_CAPSULE | ORAL | Status: DC
Start: 2017-09-11 — End: 2017-09-12

## 2017-09-12 MED ORDER — MIRTAZAPINE 15 MG PO TABS
15.00 | ORAL_TABLET | ORAL | Status: DC
Start: 2017-09-10 — End: 2017-09-12

## 2017-09-12 MED ORDER — ANTACID & ANTIGAS 200-200-20 MG/5ML PO SUSP
30.00 | ORAL | Status: DC
Start: ? — End: 2017-09-12

## 2017-09-12 MED ORDER — CLOPIDOGREL BISULFATE 75 MG PO TABS
75.00 | ORAL_TABLET | ORAL | Status: DC
Start: 2017-09-11 — End: 2017-09-12

## 2017-09-12 MED ORDER — LATANOPROST 0.005 % OP SOLN
OPHTHALMIC | Status: DC
Start: 2017-09-11 — End: 2017-09-12

## 2017-09-12 MED ORDER — TRAMADOL HCL 50 MG PO TABS
50.00 | ORAL_TABLET | ORAL | Status: DC
Start: 2017-09-10 — End: 2017-09-12

## 2017-09-12 MED ORDER — VITAMIN D3 25 MCG (1000 UT) PO TABS
ORAL_TABLET | ORAL | Status: DC
Start: 2017-09-11 — End: 2017-09-12

## 2017-09-12 MED ORDER — ALBUTEROL SULFATE (2.5 MG/3ML) 0.083% IN NEBU
2.50 | INHALATION_SOLUTION | RESPIRATORY_TRACT | Status: DC
Start: ? — End: 2017-09-12

## 2017-09-12 MED ORDER — POLYSACCHARIDE IRON COMPLEX 150 MG PO CAPS
150.00 | ORAL_CAPSULE | ORAL | Status: DC
Start: 2017-09-10 — End: 2017-09-12

## 2017-09-12 MED ORDER — PANTOPRAZOLE SODIUM 20 MG PO TBEC
20.00 | DELAYED_RELEASE_TABLET | ORAL | Status: DC
Start: 2017-09-11 — End: 2017-09-12

## 2017-09-12 MED ORDER — ACETAMINOPHEN 325 MG PO TABS
650.00 | ORAL_TABLET | ORAL | Status: DC
Start: 2017-09-10 — End: 2017-09-12

## 2017-09-12 MED ORDER — METOPROLOL TARTRATE 25 MG PO TABS
12.50 | ORAL_TABLET | ORAL | Status: DC
Start: 2017-09-10 — End: 2017-09-12

## 2017-09-12 MED ORDER — CITALOPRAM HYDROBROMIDE 20 MG PO TABS
10.00 | ORAL_TABLET | ORAL | Status: DC
Start: 2017-09-11 — End: 2017-09-12

## 2017-09-12 MED ORDER — DOCUSATE SODIUM 100 MG PO CAPS
100.00 | ORAL_CAPSULE | ORAL | Status: DC
Start: 2017-09-10 — End: 2017-09-12

## 2017-09-12 MED ORDER — ASPIRIN EC 81 MG PO TBEC
81.00 | DELAYED_RELEASE_TABLET | ORAL | Status: DC
Start: 2017-09-11 — End: 2017-09-12

## 2017-09-12 MED ORDER — PAROXETINE HCL 20 MG PO TABS
20.00 | ORAL_TABLET | ORAL | Status: DC
Start: 2017-09-11 — End: 2017-09-12

## 2017-09-12 MED ORDER — CYCLOSPORINE 0.05 % OP EMUL
OPHTHALMIC | Status: DC
Start: 2017-09-10 — End: 2017-09-12

## 2017-09-12 MED ORDER — CHLORTHALIDONE 25 MG PO TABS
25.00 | ORAL_TABLET | ORAL | Status: DC
Start: 2017-09-11 — End: 2017-09-12

## 2017-09-12 MED ORDER — TRAZODONE HCL 100 MG PO TABS
100.00 | ORAL_TABLET | ORAL | Status: DC
Start: 2017-09-10 — End: 2017-09-12

## 2017-09-12 MED ORDER — DORZOLAMIDE HCL 2 % OP SOLN
OPHTHALMIC | Status: DC
Start: 2017-09-11 — End: 2017-09-12

## 2017-09-12 MED ORDER — PRENATAL VITAMINS 28-0.8 MG PO TABS
ORAL_TABLET | ORAL | Status: DC
Start: 2017-09-11 — End: 2017-09-12

## 2017-09-17 ENCOUNTER — Other Ambulatory Visit: Payer: Self-pay | Admitting: *Deleted

## 2017-09-17 NOTE — Patient Outreach (Signed)
[facility]) Care Management  09/17/2017  [name] [name] 03-09-1941 [id number]   Referral received : 3/8 Referral source : Insurance plan , HTA Referral reason : DC from [facility].     No outreach warranted at this time. TOC will be completed by primary care provider office who will refer to [facility] care mgmt if needed. Patient PCP is listed as [place].   Plan RNCM will send message to CMA of case status.  Patient enrolled in another program [facility] program by PCP office). Case is being closed at this time.   Joylene Draft, RN, [place] Management Coordinator  [phone]- Mobile [phone]- Toll Free Main Office

## 2017-09-18 DIAGNOSIS — F3341 Major depressive disorder, recurrent, in partial remission: Secondary | ICD-10-CM | POA: Diagnosis not present

## 2017-09-25 ENCOUNTER — Other Ambulatory Visit: Payer: Self-pay

## 2017-09-25 ENCOUNTER — Encounter: Payer: Self-pay | Admitting: Family Medicine

## 2017-09-25 ENCOUNTER — Ambulatory Visit: Payer: PPO | Admitting: Family Medicine

## 2017-09-25 ENCOUNTER — Encounter: Payer: Self-pay | Admitting: Licensed Clinical Social Worker

## 2017-09-25 ENCOUNTER — Inpatient Hospital Stay: Payer: PPO | Admitting: Family Medicine

## 2017-09-25 VITALS — BP 130/64 | HR 75 | Temp 97.9°F | Wt 138.0 lb

## 2017-09-25 DIAGNOSIS — F32A Depression, unspecified: Secondary | ICD-10-CM

## 2017-09-25 DIAGNOSIS — I872 Venous insufficiency (chronic) (peripheral): Secondary | ICD-10-CM

## 2017-09-25 DIAGNOSIS — F419 Anxiety disorder, unspecified: Secondary | ICD-10-CM

## 2017-09-25 DIAGNOSIS — M5416 Radiculopathy, lumbar region: Secondary | ICD-10-CM

## 2017-09-25 DIAGNOSIS — F329 Major depressive disorder, single episode, unspecified: Secondary | ICD-10-CM | POA: Diagnosis not present

## 2017-09-25 MED ORDER — CITALOPRAM HYDROBROMIDE 20 MG PO TABS: 20.0000 mg | ORAL_TABLET | Freq: Every day | ORAL | 3 refills | Status: DC

## 2017-09-25 MED ORDER — BUSPIRONE HCL 5 MG PO TABS: 5.0000 mg | ORAL_TABLET | Freq: Three times a day (TID) | ORAL | 3 refills | Status: DC

## 2017-09-25 NOTE — Progress Notes (Addendum)
   HPI 77 year old female who presents for hospital follow up. She was recently admitted to [facility] in [place] (inpatient psych) for voiced SI and depression. Some medication changes were made. Her lamictal and celexa were stopped and she was started on paxil. She reports that things are going pretty well. Her PHQ-9 is an 18 at this visit and she remarks it is very difficulty to get along with other people. This all seems to be stemming again from a fractured relationship with her daughter. She reports that the paxil has been giving her very vivid nightmares, to the point where she is afraid to sleep at night. She remarks that she would like to be put back on the celaxa. Otherwise she states that she has been having increased itchiness at her home. This proved to be a constant stressor for her. She said she was constantly scratching her legs and back but as soon as she left her house the itchiness resolved. She is currently looking for another place to live.  CC: hospital follow up   ROS:   Review of Systems See HPI for ROS.   CC, SH/smoking status, and VS noted  Objective: BP 130/64   Pulse 75   Temp 97.9 F (36.6 C) (Oral)   Wt 138 lb (62.6 kg)   SpO2 98%   BMI 27.41 kg/m  Gen: NAD, alert, cooperative, and pleasant. Elderly caucasian female. HEENT: NCAT, EOMI, PERRL CV: RRR, no murmur Resp: CTAB, no wheezes, non-labored Abd: SNTND, BS present, no guarding or organomegaly Ext: No edema, warm. Dermatitis from venous stasis noted in BLE. Neuro: Alert and oriented, Speech clear, No gross deficits   Assessment and plan:  Anxiety and depression Patient seems to be having negative side effects to paxil. Wishes to be switched back to celexa. I think this is reasonable. Will also add buspirone due to synergystic effects with SSRI. PHQ-9 18 at this visit which is concerning. No SI or thought of harming herself. Encouraged her to keep her routine follow up at behavioral health at  [facility]  Stasis dermatitis of both legs Patient complaining of intense itching of BLE. Stasis dermatitis on exam. Appears to be linked with some agent in her house. Has stopped once leaving. Currently looking for a place to live, referred her to social work for assistance with housing.  Lumbar back pain with radiculopathy affecting right lower extremity Well controlled at this time. Receiving back injections scheduled appears to be getting good relief. Encouraged her to keep these appointments.   No orders of the defined types were placed in this encounter.   Meds ordered this encounter  Medications  . citalopram (CELEXA) 20 MG tablet    Sig: Take 1 tablet (20 mg total) by mouth daily.    Dispense:  30 tablet    Refill:  3  . busPIRone (BUSPAR) 5 MG tablet    Sig: Take 1 tablet (5 mg total) by mouth 3 (three) times daily.    Dispense:  30 tablet    Refill:  3    [name] MD PGY-1 Family Medicine Resident 10/17/2017 8:42 AM

## 2017-09-25 NOTE — Patient Instructions (Addendum)
It was great seeing you today! I am glad that you have been doing well since you left [place]. I am sorry that you have been having so much trouble with the paxil. I will switch you back to the celexa. I think you will get a lot of benefit out of adding on a medication called buspirone. It works with great synergy with the celexa. I will see you back in 2-3 weeks to discuss how these medications are working for you.  Regarding your itching, I am glad that you are able to get out of the house that is itching so much. Continue to use your creme as needed. In regards to getting a new house, I will ask social work to assist you.

## 2017-09-25 NOTE — Progress Notes (Signed)
Type of Service: Clinical Social Work  Social work consult from Dr. Kris Mouton reference  Housing resources for patient.   LCSW met with patient to assess needs. Patient is currently living with granddaughter and other family members. States unable to live in her home due to something she is allergic to. Patient is unsure of what it is.   Patient most recently lived in [place] prior to going to granddaughters home. Patient also shares she has lost weight and not been able to eat.  Patient has put in housing applications and is on a wait list.  LCSW reviewed other community housing options also spoke with PCP about possible nutritions supplement samples.  PCP approved of samples.   Intervention: emotional support, Reflective listening, Data processing manager with [employer] and [employer] samples provided.  Plan: Patient will F/ U with [employer] for additional housing resources and healthy home program.   Casimer Lanius, LCSW Licensed Clinical Social Worker [place]   [phone] 2:56 PM

## 2017-09-26 ENCOUNTER — Encounter: Payer: Self-pay | Admitting: Family Medicine

## 2017-09-26 NOTE — Assessment & Plan Note (Signed)
Well controlled at this time. Receiving back injections scheduled appears to be getting good relief. Encouraged her to keep these appointments.

## 2017-09-26 NOTE — Assessment & Plan Note (Signed)
Patient seems to be having negative side effects to paxil. Wishes to be switched back to celexa. I think this is reasonable. Will also add buspirone due to synergystic effects with SSRI. PHQ-9 18 at this visit which is concerning. No SI or thought of harming herself. Encouraged her to keep her routine follow up at behavioral health at [facility].

## 2017-09-26 NOTE — Assessment & Plan Note (Signed)
>>  ASSESSMENT AND PLAN FOR STASIS DERMATITIS OF BOTH LEGS WRITTEN ON 09/26/2017 11:30 AM BY FLETCHER, JACOB, MD  Patient complaining of intense itching of BLE. Stasis dermatitis on exam. Appears to be linked with some agent in her house. Has stopped once leaving. Currently looking for a place to live, referred her to social work for assistance with housing.

## 2017-09-26 NOTE — Assessment & Plan Note (Signed)
Patient complaining of intense itching of BLE. Stasis dermatitis on exam. Appears to be linked with some agent in her house. Has stopped once leaving. Currently looking for a place to live, referred her to social work for assistance with housing.

## 2017-10-01 ENCOUNTER — Other Ambulatory Visit: Payer: Self-pay | Admitting: Family Medicine

## 2017-10-01 DIAGNOSIS — Z1231 Encounter for screening mammogram for malignant neoplasm of breast: Secondary | ICD-10-CM

## 2017-10-02 ENCOUNTER — Other Ambulatory Visit: Payer: Self-pay | Admitting: Cardiovascular Disease

## 2017-10-02 DIAGNOSIS — I739 Peripheral vascular disease, unspecified: Secondary | ICD-10-CM

## 2017-10-10 ENCOUNTER — Ambulatory Visit (HOSPITAL_BASED_OUTPATIENT_CLINIC_OR_DEPARTMENT_OTHER)
Admission: RE | Admit: 2017-10-10 | Discharge: 2017-10-10 | Disposition: A | Discharge status: Home / Self Care | Payer: PPO | Source: Ambulatory Visit | Attending: Cardiovascular Disease | Admitting: Cardiovascular Disease

## 2017-10-10 ENCOUNTER — Ambulatory Visit (HOSPITAL_COMMUNITY)
Admission: RE | Admit: 2017-10-10 | Discharge: 2017-10-10 | Disposition: A | Discharge status: Home / Self Care | Payer: PPO | Source: Ambulatory Visit | Attending: Cardiovascular Disease | Admitting: Cardiovascular Disease

## 2017-10-10 DIAGNOSIS — E785 Hyperlipidemia, unspecified: Secondary | ICD-10-CM | POA: Diagnosis not present

## 2017-10-10 DIAGNOSIS — I708 Atherosclerosis of other arteries: Secondary | ICD-10-CM | POA: Diagnosis not present

## 2017-10-10 DIAGNOSIS — Z95820 Peripheral vascular angioplasty status with implants and grafts: Secondary | ICD-10-CM | POA: Insufficient documentation

## 2017-10-10 DIAGNOSIS — Z87891 Personal history of nicotine dependence: Secondary | ICD-10-CM | POA: Diagnosis not present

## 2017-10-10 DIAGNOSIS — I739 Peripheral vascular disease, unspecified: Secondary | ICD-10-CM | POA: Diagnosis not present

## 2017-10-10 DIAGNOSIS — I10 Essential (primary) hypertension: Secondary | ICD-10-CM | POA: Insufficient documentation

## 2017-10-12 ENCOUNTER — Other Ambulatory Visit: Payer: Self-pay

## 2017-10-12 DIAGNOSIS — I739 Peripheral vascular disease, unspecified: Secondary | ICD-10-CM

## 2017-10-12 NOTE — Progress Notes (Signed)
vas 

## 2017-10-15 DIAGNOSIS — F3341 Major depressive disorder, recurrent, in partial remission: Secondary | ICD-10-CM | POA: Diagnosis not present

## 2017-10-17 ENCOUNTER — Ambulatory Visit: Payer: PPO | Admitting: Family Medicine

## 2017-11-01 DIAGNOSIS — M545 Low back pain: Secondary | ICD-10-CM | POA: Diagnosis not present

## 2017-11-08 ENCOUNTER — Ambulatory Visit (INDEPENDENT_AMBULATORY_CARE_PROVIDER_SITE_OTHER): Payer: PPO | Admitting: Family Medicine

## 2017-11-08 ENCOUNTER — Encounter: Payer: Self-pay | Admitting: Family Medicine

## 2017-11-08 ENCOUNTER — Other Ambulatory Visit: Payer: Self-pay

## 2017-11-08 VITALS — BP 180/90 | HR 78 | Temp 98.0°F | Ht 60.0 in | Wt 140.0 lb

## 2017-11-08 DIAGNOSIS — F32A Depression, unspecified: Secondary | ICD-10-CM

## 2017-11-08 DIAGNOSIS — F419 Anxiety disorder, unspecified: Secondary | ICD-10-CM | POA: Diagnosis not present

## 2017-11-08 DIAGNOSIS — F329 Major depressive disorder, single episode, unspecified: Secondary | ICD-10-CM

## 2017-11-08 DIAGNOSIS — Z79899 Other long term (current) drug therapy: Secondary | ICD-10-CM | POA: Diagnosis not present

## 2017-11-08 DIAGNOSIS — I10 Essential (primary) hypertension: Secondary | ICD-10-CM

## 2017-11-08 DIAGNOSIS — W19XXXA Unspecified fall, initial encounter: Secondary | ICD-10-CM | POA: Diagnosis not present

## 2017-11-08 DIAGNOSIS — Z0000 Encounter for general adult medical examination without abnormal findings: Secondary | ICD-10-CM

## 2017-11-08 DIAGNOSIS — J189 Pneumonia, unspecified organism: Secondary | ICD-10-CM

## 2017-11-08 MED ORDER — TRAMADOL HCL 50 MG PO TABS: 100.0000 mg | ORAL_TABLET | Freq: Two times a day (BID) | ORAL | 0 refills | Status: DC | PRN

## 2017-11-08 MED ORDER — AZITHROMYCIN 250 MG PO TABS: ORAL_TABLET | ORAL | 0 refills | Status: AC

## 2017-11-08 NOTE — Patient Instructions (Addendum)
It was great seeing you again! I am sorry that you have been having such a rough time as of late. I am glad that you are close to getting new housing and that you have been doing much better from an anxiety and depression standpoint. Today we will be stopping both your trazodone and imodium. We can also do a trial off of the lamictal. Do not stop taking this medication abruptly. Please taper off of it by halving the dose every 3 days until you stop.  Your leg wound looks ok. There are no signs of infection. I will give you an antibiotic for a possible "walking pnuemonia". Your blood pressure was pretty elevated at today's visit. I will also increase your dose of metoprolol. Please take 25mg  two times daily. I will see you back in 2-3 weeks for a check up to see how things are going. I refilled your tramadol today.

## 2017-11-09 LAB — LIPID PANEL
Chol/HDL Ratio: 3.2 ratio (ref 0.0–4.4)
Cholesterol, Total: 214 mg/dL — ABNORMAL HIGH (ref 100–199)
HDL: 67 mg/dL (ref >39)
LDL Calculated: 128 mg/dL — ABNORMAL HIGH (ref 0–99)
Triglycerides: 93 mg/dL (ref 0–149)
VLDL Cholesterol Cal: 19 mg/dL (ref 5–40)

## 2017-11-09 LAB — COMPREHENSIVE METABOLIC PANEL WITH GFR
ALT: 5 IU/L (ref 0–32)
AST: 15 IU/L (ref 0–40)
Albumin/Globulin Ratio: 1.7 (ref 1.2–2.2)
Albumin: 4.2 g/dL (ref 3.5–4.8)
Alkaline Phosphatase: 82 IU/L (ref 39–117)
BUN/Creatinine Ratio: 16 (ref 12–28)
BUN: 17 mg/dL (ref 8–27)
Bilirubin Total: <0.2 mg/dL (ref 0.0–1.2)
CO2: 23 mmol/L (ref 20–29)
Calcium: 9.2 mg/dL (ref 8.7–10.3)
Chloride: 109 mmol/L — ABNORMAL HIGH (ref 96–106)
Creatinine, Ser: 1.07 mg/dL — ABNORMAL HIGH (ref 0.57–1.00)
GFR calc Af Amer: 57 mL/min/1.73 — ABNORMAL LOW (ref >59)
GFR calc non Af Amer: 50 mL/min/1.73 — ABNORMAL LOW (ref >59)
Globulin, Total: 2.5 g/dL (ref 1.5–4.5)
Glucose: 83 mg/dL (ref 65–99)
Potassium: 4.7 mmol/L (ref 3.5–5.2)
Sodium: 145 mmol/L — ABNORMAL HIGH (ref 134–144)
Total Protein: 6.7 g/dL (ref 6.0–8.5)

## 2017-11-09 LAB — CBC WITH DIFFERENTIAL/PLATELET
Basophils Absolute: 0 10*3/uL (ref 0.0–0.2)
Basos: 0 %
EOS (ABSOLUTE): 0.1 10*3/uL (ref 0.0–0.4)
Eos: 1 %
Hematocrit: 33.4 % — ABNORMAL LOW (ref 34.0–46.6)
Hemoglobin: 11 g/dL — ABNORMAL LOW (ref 11.1–15.9)
Immature Grans (Abs): 0 10*3/uL (ref 0.0–0.1)
Immature Granulocytes: 0 %
Lymphocytes Absolute: 1.6 10*3/uL (ref 0.7–3.1)
Lymphs: 20 %
MCH: 32.5 pg (ref 26.6–33.0)
MCHC: 32.9 g/dL (ref 31.5–35.7)
MCV: 99 fL — ABNORMAL HIGH (ref 79–97)
Monocytes Absolute: 0.4 10*3/uL (ref 0.1–0.9)
Monocytes: 6 %
Neutrophils Absolute: 5.8 10*3/uL (ref 1.4–7.0)
Neutrophils: 73 %
Platelets: 209 10*3/uL (ref 150–379)
RBC: 3.38 x10E6/uL — ABNORMAL LOW (ref 3.77–5.28)
RDW: 14.5 % (ref 12.3–15.4)
WBC: 7.9 10*3/uL (ref 3.4–10.8)

## 2017-11-11 ENCOUNTER — Encounter: Payer: Self-pay | Admitting: Family Medicine

## 2017-11-11 DIAGNOSIS — J189 Pneumonia, unspecified organism: Secondary | ICD-10-CM

## 2017-11-11 DIAGNOSIS — Z0000 Encounter for general adult medical examination without abnormal findings: Secondary | ICD-10-CM | POA: Insufficient documentation

## 2017-11-11 DIAGNOSIS — Z79899 Other long term (current) drug therapy: Secondary | ICD-10-CM | POA: Insufficient documentation

## 2017-11-11 DIAGNOSIS — W19XXXA Unspecified fall, initial encounter: Secondary | ICD-10-CM | POA: Insufficient documentation

## 2017-11-11 HISTORY — DX: Pneumonia, unspecified organism: J18.9

## 2017-11-11 NOTE — Assessment & Plan Note (Signed)
Leg wound looks to be healing well. Has some bruising, but this is to be expected given her Dual antiplatelet therapy. Gave return precautions.

## 2017-11-11 NOTE — Assessment & Plan Note (Signed)
Drew lipid panel, cmp, cbc at today's visit.

## 2017-11-11 NOTE — Assessment & Plan Note (Signed)
Has been doing well on her celexa and buspirone dosing. PHQ-9 has improved slightly from 17 to 14. Will follow up at subsequent visits. Patient is being weaned off of her lamictal as she was supposed to have been off of this medication since her hospitalization in February.

## 2017-11-11 NOTE — Progress Notes (Addendum)
HPI 77 year old female who presents with numerous complaints. Patient was last seen by me on 3/19 for anxiety and depression. At that visit we stopped her paxil and restarted celexa and buspirone. She has been doing "really well" from that standpoint and is happy with her current regimen.  She also comes for concerns of polypharmacy. She is interested in stopping some of her medications. On review the patient has no indication for imodium or trazodone. Patient also had lamotrigine. On record review from inpatient stay in [place] this medication was DC'd. Instructed patient to taper stopping this medication.  Patient with blood pressure of 180/110 in office and very similar on recheck at presentation. Rechecked 20 minutes later and had lowered to 160/100. Patient with no alarm symptoms.   Patient also has complaints of having small laceration on right leg from a fall. She states that a stick actually penetrated the skin. She has been changing bandages and keeping wound clean for last 1 week. It is still hurting but has been getting better.  Patient also has complaints of month long symptoms of congestion, and cough. She states that it feels like she has to cough up phlegm but she just can't get it up. Has occasionally coughed up yellow phlegm. Has been feeling weak.  CC: numerous complaints   ROS: depression f/u  Review of Systems See HPI for ROS.   CC, SH/smoking status, and VS noted  Objective: BP (!) 180/90 (BP Location: Left Arm, Patient Position: Sitting, Cuff Size: Normal)   Pulse 78   Temp 98 F (36.7 C) (Oral)   Ht 5' (1.524 m)   Wt 63.5 kg (140 lb)   SpO2 98%   BMI 27.34 kg/m  Gen: NAD, alert, cooperative, and pleasant. Elderly caucasian female. HEENT: NCAT, EOMI, PERRL CV: RRR, no murmur Resp: CTAB, no wheezes, non-labored. Lungs clear to ausculation. Patient cough profusely on deep breath. Abd: SNTND, BS present, no guarding or organomegaly Ext: No edema, warm.  Dermatitis from venous stasis noted in BLE. Neuro: Alert and oriented, Speech clear, No gross deficits RLE: 1" x 1" wound that is healing well. There is no surrounding erythema and no symptoms consistent with cellulitis or infection.  Assessment and plan:  Hypertension Did improve somewhat in the office but still in 366Y systolic. Will increase dosage of metoprolol to 25mg  bid. Will follow up in 2-3 weeks for BP check.  Polypharmacy Patient at high risk for polypharmacy. Reviewed all medication. Stopped imodium, trazodone. Patient also not supposed to be on lamictal anymore. Gave instructions to wean dosage over next week.  Healthcare maintenance Drew lipid panel, cmp, cbc at today's visit.  Fall Leg wound looks to be healing well. Has some bruising, but this is to be expected given her Dual antiplatelet therapy. Gave return precautions.  Anxiety and depression Has been doing well on her celexa and buspirone dosing. PHQ-9 has improved slightly from 17 to 14. Will follow up at subsequent visits. Patient is being weaned off of her lamictal as she was supposed to have been off of this medication since her hospitalization in February.  Atypical pneumonia Given duration of symptoms, lethargy, and non-productive cough patient has likely symptoms of an atypical pneumonia. Patient does have other comorbidities that can cause these symptoms but will go ahead and treat. Giving Z-Pak, return precautions given.   Orders Placed This Encounter  Procedures  . Lipid panel  . Comprehensive metabolic panel  . CBC  . Lipid Panel  . Comprehensive  metabolic panel  . CBC with Differential    Meds ordered this encounter  Medications  . traMADol (ULTRAM) 50 MG tablet    Sig: Take 2 tablets (100 mg total) by mouth 2 (two) times daily as needed for moderate pain.    Dispense:  60 tablet    Refill:  0  . azithromycin (ZITHROMAX Z-PAK) 250 MG tablet    Sig: Take 2 tablets (500 mg) on  Day 1,  followed  by 1 tablet (250 mg) once daily on Days 2 through 5.    Dispense:  6 each    Refill:  0     [name] MD PGY-1 Family Medicine Resident  11/11/2017 7:11 PM

## 2017-11-11 NOTE — Assessment & Plan Note (Signed)
Did improve somewhat in the office but still in 786L systolic. Will increase dosage of metoprolol to 25mg  bid. Will follow up in 2-3 weeks for BP check.

## 2017-11-11 NOTE — Assessment & Plan Note (Signed)
Patient at high risk for polypharmacy. Reviewed all medication. Stopped imodium, trazodone. Patient also not supposed to be on lamictal anymore. Gave instructions to wean dosage over next week.

## 2017-11-11 NOTE — Assessment & Plan Note (Signed)
Given duration of symptoms, lethargy, and non-productive cough patient has likely symptoms of an atypical pneumonia. Patient does have other comorbidities that can cause these symptoms but will go ahead and treat. Giving Z-Pak, return precautions given.

## 2017-11-12 ENCOUNTER — Ambulatory Visit
Admission: RE | Admit: 2017-11-12 | Discharge: 2017-11-12 | Disposition: A | Discharge status: Home / Self Care | Payer: PPO | Source: Ambulatory Visit | Attending: Family Medicine | Admitting: Family Medicine

## 2017-11-12 DIAGNOSIS — Z1231 Encounter for screening mammogram for malignant neoplasm of breast: Secondary | ICD-10-CM | POA: Diagnosis not present

## 2017-11-13 ENCOUNTER — Telehealth: Payer: Self-pay | Admitting: Family Medicine

## 2017-11-13 NOTE — Telephone Encounter (Signed)
Called patient and unfortunately only got her answering machine. Relayed most recent lab results. Informed her that mammogram came back normal and will need to get one next year. Also told her that cbc looked fine. Informed her that lipid panel showed a slightly high cholesterol of 214. Cmp showed that she might be a little dehydrated with a small bump in creatinine, sodium of 145, and GFR of 50.   [name] MD PGY-1 Family Medicine Resident

## 2017-11-21 ENCOUNTER — Telehealth: Payer: Self-pay | Admitting: Family Medicine

## 2017-11-21 DIAGNOSIS — L570 Actinic keratosis: Secondary | ICD-10-CM | POA: Diagnosis not present

## 2017-11-21 DIAGNOSIS — Z85828 Personal history of other malignant neoplasm of skin: Secondary | ICD-10-CM | POA: Diagnosis not present

## 2017-11-21 DIAGNOSIS — L905 Scar conditions and fibrosis of skin: Secondary | ICD-10-CM | POA: Diagnosis not present

## 2017-11-21 NOTE — Telephone Encounter (Signed)
[facility]--sent a 2 page request for DME [facility] (?)  It was faxed 5-13 and today. He is needing it reviewed and signed > please advsie

## 2017-11-21 NOTE — Telephone Encounter (Signed)
Form in MDs box. [name] [name], CMA

## 2017-11-22 NOTE — Telephone Encounter (Signed)
Completed this form and placed in pile to be faxed yesterday.  [name] MD PGY-1 Family Medicine Resident

## 2017-11-27 DIAGNOSIS — I872 Venous insufficiency (chronic) (peripheral): Secondary | ICD-10-CM | POA: Diagnosis not present

## 2017-11-28 ENCOUNTER — Ambulatory Visit (INDEPENDENT_AMBULATORY_CARE_PROVIDER_SITE_OTHER): Payer: PPO | Admitting: Family Medicine

## 2017-11-28 VITALS — BP 150/90 | HR 75 | Temp 98.4°F | Ht 60.0 in | Wt 138.4 lb

## 2017-11-28 DIAGNOSIS — R5383 Other fatigue: Secondary | ICD-10-CM | POA: Diagnosis not present

## 2017-11-28 DIAGNOSIS — F411 Generalized anxiety disorder: Secondary | ICD-10-CM

## 2017-11-28 DIAGNOSIS — R040 Epistaxis: Secondary | ICD-10-CM | POA: Diagnosis not present

## 2017-11-28 NOTE — Patient Instructions (Addendum)
It was great seeing you today! I am sorry that you have had a tough time with nose bleed. It looks like you have an area if irritation in the left nostril which I think is likely due to dry air caused by air conditioning. Using saline spray in each nostril will be a good treatment and prevent recurrence.  In regards to you dream, I think this is likely due to the buspar that you were started on a couple of clinic visits ago. This is a known side effect and nothing to worry about if they are no bothering you.  Regarding the fatigue I think it could be due to the blood lose you have suffered from the nose bleeds. I would like to test your blood counts today. I would also like to test your thyroid hormone. I will call you with these results.

## 2017-11-29 LAB — CBC WITH DIFFERENTIAL/PLATELET
Basophils Absolute: 0 10*3/uL (ref 0.0–0.2)
Basos: 0 %
EOS (ABSOLUTE): 0.1 10*3/uL (ref 0.0–0.4)
Eos: 2 %
Hematocrit: 35.1 % (ref 34.0–46.6)
Hemoglobin: 11.8 g/dL (ref 11.1–15.9)
Immature Grans (Abs): 0 10*3/uL (ref 0.0–0.1)
Immature Granulocytes: 0 %
Lymphocytes Absolute: 2.5 10*3/uL (ref 0.7–3.1)
Lymphs: 39 %
MCH: 32.4 pg (ref 26.6–33.0)
MCHC: 33.6 g/dL (ref 31.5–35.7)
MCV: 96 fL (ref 79–97)
Monocytes Absolute: 0.7 10*3/uL (ref 0.1–0.9)
Monocytes: 10 %
Neutrophils Absolute: 3.2 10*3/uL (ref 1.4–7.0)
Neutrophils: 49 %
Platelets: 194 10*3/uL (ref 150–450)
RBC: 3.64 x10E6/uL — ABNORMAL LOW (ref 3.77–5.28)
RDW: 14 % (ref 12.3–15.4)
WBC: 6.6 10*3/uL (ref 3.4–10.8)

## 2017-11-29 LAB — TSH: TSH: 1.96 u[IU]/mL (ref 0.450–4.500)

## 2017-12-04 ENCOUNTER — Encounter: Payer: Self-pay | Admitting: Family Medicine

## 2017-12-04 DIAGNOSIS — R040 Epistaxis: Secondary | ICD-10-CM | POA: Insufficient documentation

## 2017-12-04 NOTE — Progress Notes (Signed)
   HPI 77 year old who presents with complaints of nose bleeds and fatigue. Patient has recently been living with her daughter who keeps her house very dry and cool with air conditioner. This is apparently a big change from her previous house. She states that she has had multiple episodes of nose bleeds which have taken several minutes to stop.  Patient also states that she has been having fatigue. She states that she is getting pretty good sleep but just feels tired during the day. This seems to have started after the nosebleeds.  Patient states that she has been doing very well from an anxiety and depression standpoint on buspirone and celexa.  Has been doing very well from a respiratory standpoint. All symptoms resolved after azithromycin.   CC: nose bleeding, anxiety   ROS:  Review of Systems See HPI for ROS.   CC, SH/smoking status, and VS noted  Objective: BP (!) 150/90 (BP Location: Left Arm, Patient Position: Sitting, Cuff Size: Normal)   Pulse 75   Temp 98.4 F (36.9 C) (Oral)   Ht 5' (1.524 m)   Wt 138 lb 6.4 oz (62.8 kg)   SpO2 98%   BMI 27.03 kg/m  Gen:NAD, alert, cooperative, and pleasant. Elderly caucasian female. HEENT:NCAT, EOMI, PERRL. Small abrasion in left nostril. CV:RRR, no murmur Resp:CTAB, no wheezes, non-labored. Lungs clear to ausculation. NUU:VOZDG, BS present, no guarding or organomegaly Ext:No edema, warm. Dermatitis from venous stasis noted in BLE. Neuro:Alert and oriented, Speech clear, No gross deficits RLE: Improved RLE, now closed. Healing well. There is no surrounding erythema and no symptoms consistent with cellulitis or infection.  Assessment and plan:  Fatigue Likely due to chronic illnesses and recently recovering pneumonia. Cannot rule out anemia and will recheck TSH level.  Bleeding from the nose Patient with small abrasion. Likely due to change in humidity in house. She will have a very hard time clotting even a small nose  bleed given that she is on dual anti-platelet therapy. Prescribed saline spray to keep nose moist, prevent abrasion.  Anxiety state Doing very well on current regimen of celexa and buspirone. Continue current meds.   Orders Placed This Encounter  Procedures  . TSH  . CBC with Differential    No orders of the defined types were placed in this encounter.    [name] MD PGY-1 Family Medicine Resident  12/04/2017 4:50 PM

## 2017-12-04 NOTE — Assessment & Plan Note (Signed)
Doing very well on current regimen of celexa and buspirone. Continue current meds.

## 2017-12-04 NOTE — Assessment & Plan Note (Signed)
Likely due to chronic illnesses and recently recovering pneumonia. Cannot rule out anemia and will recheck TSH level.

## 2017-12-04 NOTE — Assessment & Plan Note (Signed)
Patient with small abrasion. Likely due to change in humidity in house. She will have a very hard time clotting even a small nose bleed given that she is on dual anti-platelet therapy. Prescribed saline spray to keep nose moist, prevent abrasion.

## 2017-12-04 NOTE — Assessment & Plan Note (Signed)
>>  ASSESSMENT AND PLAN FOR GENERALIZED WEAKNESS WRITTEN ON 12/04/2017  4:48 PM BY FLETCHER, JACOB, MD  Likely due to chronic illnesses and recently recovering pneumonia. Cannot rule out anemia and will recheck TSH level.

## 2017-12-06 ENCOUNTER — Other Ambulatory Visit: Payer: Self-pay | Admitting: *Deleted

## 2017-12-06 MED ORDER — BUSPIRONE HCL 5 MG PO TABS
5.0000 mg | ORAL_TABLET | Freq: Three times a day (TID) | ORAL | 3 refills | Status: DC
Start: 2017-12-06 — End: 2017-12-18

## 2017-12-07 ENCOUNTER — Encounter: Payer: Self-pay | Admitting: Internal Medicine

## 2017-12-07 ENCOUNTER — Other Ambulatory Visit: Payer: Self-pay | Admitting: Internal Medicine

## 2017-12-07 ENCOUNTER — Other Ambulatory Visit: Payer: Self-pay

## 2017-12-07 ENCOUNTER — Ambulatory Visit (INDEPENDENT_AMBULATORY_CARE_PROVIDER_SITE_OTHER): Payer: PPO | Admitting: Internal Medicine

## 2017-12-07 VITALS — BP 144/82 | HR 81 | Temp 98.4°F | Ht 60.0 in | Wt 139.0 lb

## 2017-12-07 DIAGNOSIS — I10 Essential (primary) hypertension: Secondary | ICD-10-CM

## 2017-12-07 MED ORDER — AMLODIPINE BESYLATE 5 MG PO TABS: 5.0000 mg | ORAL_TABLET | Freq: Every day | ORAL | 0 refills | Status: DC

## 2017-12-07 NOTE — Patient Instructions (Signed)
I am going to start your on blood pressure medication. It is a low dose. I want you to follow up 1 month. If your blood pressure is above 150/80 please return sooner for an appointment

## 2017-12-07 NOTE — Assessment & Plan Note (Signed)
Will start patient on  Norvasc to help control blood pressure  Obtain BMET  Follow up with PCP in 1 month

## 2017-12-07 NOTE — Progress Notes (Signed)
   [facility] Kerrin Mo, MD Phone: [phone]  Reason For Visit: SDA for Blood Pressure   # CHRONIC HTN: Reports blood pressure has not been well controlled - patient notes that blood pressure  Reports good compliance, took meds today. Tolerating well, w/o complaints. Lifestyle - Denies eating a lot of salty, Denies CP, dyspnea, dizziness / lightheadedness Chronic edema in her legs   Past Medical History Reviewed problem list.  Medications- reviewed and updated No additions to family history Social history- patient is a non smoker  Objective: BP (!) 144/82   Pulse 81   Temp 98.4 F (36.9 C) (Oral)   Ht 5' (1.524 m)   Wt 139 lb (63 kg)   SpO2 99%   BMI 27.15 kg/m  Gen: NAD, alert, cooperative with exam Cardio: regular rate and rhythm, S1S2 heard, no murmurs appreciated Pulm: clear to auscultation bilaterally, no wheezes, rhonchi or rales Extremities: Stasis dermatitis noted on bilateral legs with brawny darkening, no pitting edema noted   Assessment/Plan: See problem based a/p  Hypertension Will start patient on  Norvasc to help control blood pressure  Obtain BMET  Follow up with PCP in 1 month

## 2017-12-08 LAB — BASIC METABOLIC PANEL
BUN/Creatinine Ratio: 9 — ABNORMAL LOW (ref 12–28)
BUN: 10 mg/dL (ref 8–27)
CO2: 23 mmol/L (ref 20–29)
Calcium: 9.1 mg/dL (ref 8.7–10.3)
Chloride: 106 mmol/L (ref 96–106)
Creatinine, Ser: 1.08 mg/dL — ABNORMAL HIGH (ref 0.57–1.00)
GFR calc Af Amer: 57 mL/min/{1.73_m2} — ABNORMAL LOW (ref >59)
GFR calc non Af Amer: 49 mL/min/{1.73_m2} — ABNORMAL LOW (ref >59)
Glucose: 78 mg/dL (ref 65–99)
Potassium: 4.3 mmol/L (ref 3.5–5.2)
Sodium: 144 mmol/L (ref 134–144)

## 2017-12-10 ENCOUNTER — Encounter: Payer: Self-pay | Admitting: Internal Medicine

## 2017-12-14 DIAGNOSIS — H04123 Dry eye syndrome of bilateral lacrimal glands: Secondary | ICD-10-CM | POA: Diagnosis not present

## 2017-12-14 DIAGNOSIS — H401212 Low-tension glaucoma, right eye, moderate stage: Secondary | ICD-10-CM | POA: Diagnosis not present

## 2017-12-14 DIAGNOSIS — H40053 Ocular hypertension, bilateral: Secondary | ICD-10-CM | POA: Diagnosis not present

## 2017-12-14 DIAGNOSIS — H401223 Low-tension glaucoma, left eye, severe stage: Secondary | ICD-10-CM | POA: Diagnosis not present

## 2017-12-18 ENCOUNTER — Other Ambulatory Visit: Payer: Self-pay | Admitting: Family Medicine

## 2017-12-18 ENCOUNTER — Other Ambulatory Visit: Payer: Self-pay

## 2017-12-18 DIAGNOSIS — K219 Gastro-esophageal reflux disease without esophagitis: Secondary | ICD-10-CM

## 2017-12-18 MED ORDER — PANTOPRAZOLE SODIUM 20 MG PO TBEC: 20.0000 mg | DELAYED_RELEASE_TABLET | Freq: Every day | ORAL | 0 refills | Status: DC

## 2017-12-18 MED ORDER — CITALOPRAM HYDROBROMIDE 20 MG PO TABS: 20.0000 mg | ORAL_TABLET | Freq: Every day | ORAL | 0 refills | Status: DC

## 2017-12-18 NOTE — Telephone Encounter (Signed)
Have refilled the prescription for pantoprazole. Unfortunately I never received request from her pharmacy. Please let her know this has been sent in and apologize for the delay for me.  [name] MD PGY-1 Family Medicine Resident

## 2017-12-18 NOTE — Telephone Encounter (Signed)
Pt informed. Deseree Blount, CMA  

## 2017-12-18 NOTE — Telephone Encounter (Signed)
Pt called and said her Pharmacy has been calling regarding her Pantoprazole refill. They are saying they never received it. Pt would like to be contacted when she is able to have this refilled.

## 2017-12-19 MED ORDER — BUSPIRONE HCL 5 MG PO TABS: 5.0000 mg | ORAL_TABLET | Freq: Three times a day (TID) | ORAL | 3 refills | Status: DC

## 2017-12-21 ENCOUNTER — Telehealth: Payer: Self-pay

## 2017-12-21 NOTE — Telephone Encounter (Signed)
Received call from pt on nurse line requesting a refill on buspar. Per chart review, pcp sent over a refill to [facility]. I contacted [facility] and they are in process of filling this rx for her. LVM on pts phone letting her know.

## 2017-12-24 DIAGNOSIS — H21562 Pupillary abnormality, left eye: Secondary | ICD-10-CM | POA: Diagnosis not present

## 2017-12-24 DIAGNOSIS — H5359 Other color vision deficiencies: Secondary | ICD-10-CM | POA: Diagnosis not present

## 2017-12-24 DIAGNOSIS — H401133 Primary open-angle glaucoma, bilateral, severe stage: Secondary | ICD-10-CM | POA: Diagnosis not present

## 2017-12-24 DIAGNOSIS — H53452 Other localized visual field defect, left eye: Secondary | ICD-10-CM | POA: Diagnosis not present

## 2017-12-25 DIAGNOSIS — H401133 Primary open-angle glaucoma, bilateral, severe stage: Secondary | ICD-10-CM | POA: Insufficient documentation

## 2017-12-31 DIAGNOSIS — F3341 Major depressive disorder, recurrent, in partial remission: Secondary | ICD-10-CM | POA: Diagnosis not present

## 2018-01-01 ENCOUNTER — Encounter: Payer: Self-pay | Admitting: Cardiovascular Disease

## 2018-01-01 ENCOUNTER — Ambulatory Visit (INDEPENDENT_AMBULATORY_CARE_PROVIDER_SITE_OTHER): Payer: PPO | Admitting: Cardiovascular Disease

## 2018-01-01 VITALS — BP 116/52 | HR 71 | Ht 60.0 in | Wt 144.0 lb

## 2018-01-01 DIAGNOSIS — I10 Essential (primary) hypertension: Secondary | ICD-10-CM

## 2018-01-01 DIAGNOSIS — I779 Disorder of arteries and arterioles, unspecified: Secondary | ICD-10-CM

## 2018-01-01 DIAGNOSIS — E7800 Pure hypercholesterolemia, unspecified: Secondary | ICD-10-CM

## 2018-01-01 DIAGNOSIS — I739 Peripheral vascular disease, unspecified: Secondary | ICD-10-CM

## 2018-01-01 NOTE — Progress Notes (Signed)
01/01/2018 [name]   Nov 30, 1940  [id number]  Primary Physician [name] Dawn, MD Primary Cardiologist: [name] MD [name] [name], [place]  HPI:  [name] [name] is a 77 y.o. female mild to moderately overweight Caucasian female mother of 2 children referred by [name] [name] for peripheral vascular evaluation. I last saw her in the office 04/04/2016. She has a history of treated hypertension and hyperlipidemia. Risk factors also include 25-pack-years of tobacco abuse having quit 08/23/10. She has never had a heart attack or stroke. She has had a right iliac stent placed in 2008 by [name] [name] and has been further evaluated by [name] [name] in [facility] in 2015. She has complained of progressive left lower extreme claudication. Recent Dopplers in the office performed 01/07/16 revealed fairly normal ABIs although she had high-frequency signals and left common and external iliac arteries. I performed angiography on her 01/31/16 revealing a physiologically significant distal left common iliac artery stenosis which I stented. She had a 60-70% focal mid left SFA stenosis with three-vessel runoff. Her postprocedure. Doppler showed improvement in her velocities in that area. Her claudication on the left has since resolved. Since I saw her 2 years ago she is remained stable.  She no longer has claudications.  Her Dopplers continue to show a widely patent stent.     Current Meds  Medication Sig  . acetaminophen (TYLENOL) 500 MG tablet Take 1,000 mg by mouth every 6 (six) hours as needed for moderate pain or headache.  [name] amLODipine (NORVASC) 5 MG tablet TAKE 1 TABLET(5 MG) BY MOUTH DAILY  . aspirin EC 81 MG EC tablet Take 1 tablet (81 mg total) by mouth daily.  . brimonidine (ALPHAGAN) 0.15 % ophthalmic solution Place 1 drop into both eyes 2 (two) times daily.  . brimonidine-timolol (COMBIGAN) 0.2-0.5 % ophthalmic solution Place 1 drop into both eyes every 12 (twelve) hours.  . busPIRone (BUSPAR) 5 MG  tablet Take 1 tablet (5 mg total) by mouth 3 (three) times daily.  . cholecalciferol (VITAMIN D) 1000 units tablet Take 1,000 Units by mouth daily.  . Cholecalciferol (VITAMIN D3) 1000 units CAPS 1,000 mg daily.  . citalopram (CELEXA) 20 MG tablet Take 1 tablet (20 mg total) by mouth daily.  . clopidogrel (PLAVIX) 75 MG tablet TAKE ONE TABLET BY MOUTH IN THE MORNING  . diclofenac sodium (VOLTAREN) 1 % GEL Apply 4 g topically 4 (four) times daily.  . Latanoprostene Bunod (VYZULTA) 0.024 % SOLN Place 1 drop into both eyes at bedtime.  . metoprolol tartrate (LOPRESSOR) 25 MG tablet Take 0.5 tablets (12.5 mg total) by mouth 2 (two) times daily.  . mirtazapine (REMERON) 15 MG tablet Take 15 mg by mouth at bedtime.  . Multiple Vitamin (MULTIVITAMIN WITH MINERALS) TABS tablet Take 1 tablet by mouth daily.  . pantoprazole (PROTONIX) 20 MG tablet Take 1 tablet (20 mg total) by mouth daily.  . traMADol (ULTRAM) 50 MG tablet Take 2 tablets (100 mg total) by mouth 2 (two) times daily as needed for moderate pain.  . [DISCONTINUED] cycloSPORINE (RESTASIS) 0.05 % ophthalmic emulsion Place 1 drop into both eyes 3 (three) times daily.  . [DISCONTINUED] dorzolamide (TRUSOPT) 2 % ophthalmic solution Place 1 drop into both eyes 2 (two) times daily.   . [DISCONTINUED] latanoprost (XALATAN) 0.005 % ophthalmic solution Place 1 drop into both eyes at bedtime.     Allergies  Allergen Reactions  . Gabapentin Itching    Was deleted since patient  was taking, but now experiencing itching again.   . Statins Swelling, Rash and Other (See Comments)    Swelling involving tongue; also causes muscle pain    Social History   Socioeconomic History  . Marital status: Widowed    Spouse name: Not on file  . Number of children: Not on file  . Years of education: Not on file  . Highest education level: Not on file  Occupational History  . Not on file  Social Needs  . Financial resource strain: Not on file  . Food  insecurity:    Worry: Not on file    Inability: Not on file  . Transportation needs:    Medical: Not on file    Non-medical: Not on file  Tobacco Use  . Smoking status: Former Smoker    Packs/day: 1.00    Years: 40.00    Pack years: 40.00    Types: Cigarettes    Last attempt to quit: 07/24/2010    Years since quitting: 7.4  . Smokeless tobacco: Never Used  . Tobacco comment: STARTED BACK JULY 2013 AND RECENTLY QUIT 03-10-2012  Substance and Sexual Activity  . Alcohol use: No    Alcohol/week: 0.0 oz    Comment: QUIT IN 1999  . Drug use: No  . Sexual activity: Never    Comment: 1st intercourse 77 yo-Fewer than 5 partners  Lifestyle  . Physical activity:    Days per week: Not on file    Minutes per session: Not on file  . Stress: Not on file  Relationships  . Social connections:    Talks on phone: Not on file    Gets together: Not on file    Attends religious service: Not on file    Active member of club or organization: Not on file    Attends meetings of clubs or organizations: Not on file    Relationship status: Not on file  . Intimate partner violence:    Fear of current or ex partner: Not on file    Emotionally abused: Not on file    Physically abused: Not on file    Forced sexual activity: Not on file  Other Topics Concern  . Not on file  Social History Narrative  . Not on file     Review of Systems: General: negative for chills, fever, night sweats or weight changes.  Cardiovascular: negative for chest pain, dyspnea on exertion, edema, orthopnea, palpitations, paroxysmal nocturnal dyspnea or shortness of breath Dermatological: negative for rash Respiratory: negative for cough or wheezing Urologic: negative for hematuria Abdominal: negative for nausea, vomiting, diarrhea, bright red blood per rectum, melena, or hematemesis Neurologic: negative for visual changes, syncope, or dizziness All other systems reviewed and are otherwise negative except as noted  above.    Blood pressure (!) 116/52, pulse 71, height 5' (1.524 m), weight 144 lb (65.3 kg).  General appearance: alert and no distress Neck: no adenopathy, no carotid bruit, no JVD, supple, symmetrical, trachea midline and thyroid not enlarged, symmetric, no tenderness/mass/nodules Lungs: clear to auscultation bilaterally Heart: regular rate and rhythm, S1, S2 normal, no murmur, click, rub or gallop Extremities: extremities normal, atraumatic, no cyanosis or edema Pulses: 2+ and symmetric Skin: Skin color, texture, turgor normal. No rashes or lesions Neurologic: Alert and oriented X 3, normal strength and tone. Normal symmetric reflexes. Normal coordination and gait  EKG not performed today  ASSESSMENT AND PLAN:   Hypertension History of essential hypertension her blood pressure measured today 116/52.  She is on Norvasc and Lopressor.  Continue current meds at current dosing.  Hyperlipemia History of hyperlipidemia on statin therapy with lipid profile performed 11/08/2017 revealing an LDL of 128 and HDL of 67.  She apparently is statin intolerant.  I am going to explore beginning a PCSK9 inhibitor.  Carotid bruit History of moderate left ICA stenosis.  We will repeat carotid Dopplers on her.  Peripheral vascular disease ([place]) History of peripheral arterial disease status post stenting of her left common and external iliac artery by myself 01/31/2016.  She did have a 67% mid left SFA stenosis with three-vessel runoff bilaterally.  She is currently denies claudication.  Recent lower extremity arterial Doppler studies performed in our office 10/10/2017 revealed normal ABIs with a widely patent stent.  We will continue to follow this noninvasively on an annual basis.      [name] MD FACP,FACC,FAHA, [facility] 01/01/2018 2:28 PM

## 2018-01-01 NOTE — Assessment & Plan Note (Signed)
History of essential hypertension her blood pressure measured today 116/52.  She is on Norvasc and Lopressor.  Continue current meds at current dosing.

## 2018-01-01 NOTE — Patient Instructions (Addendum)
Your physician has requested that you have a carotid duplex. This test is an ultrasound of the carotid arteries in your neck. It looks at blood flow through these arteries that supply the brain with blood. Allow one hour for this exam. There are no restrictions or special instructions. SCHEDULE 10/2018 WITH OTHER DOPPLERS   Your physician recommends that you schedule a follow-up appointment in: AS NEEDED WITH DR Gwenlyn Found  Your physician recommends that you schedule a follow-up appointment in: November 2019 WITH DR Johnsie Cancel

## 2018-01-01 NOTE — Assessment & Plan Note (Signed)
History of hyperlipidemia on statin therapy with lipid profile performed 11/08/2017 revealing an LDL of 128 and HDL of 67.  She apparently is statin intolerant.  I am going to explore beginning a PCSK9 inhibitor.

## 2018-01-01 NOTE — Assessment & Plan Note (Signed)
History of peripheral arterial disease status post stenting of her left common and external iliac artery by myself 01/31/2016.  She did have a 67% mid left SFA stenosis with three-vessel runoff bilaterally.  She is currently denies claudication.  Recent lower extremity arterial Doppler studies performed in our office 10/10/2017 revealed normal ABIs with a widely patent stent.  We will continue to follow this noninvasively on an annual basis.

## 2018-01-01 NOTE — Assessment & Plan Note (Signed)
History of moderate left ICA stenosis.  We will repeat carotid Dopplers on her.

## 2018-01-02 ENCOUNTER — Ambulatory Visit (INDEPENDENT_AMBULATORY_CARE_PROVIDER_SITE_OTHER): Payer: PPO | Admitting: Family Medicine

## 2018-01-02 ENCOUNTER — Encounter: Payer: Self-pay | Admitting: Family Medicine

## 2018-01-02 ENCOUNTER — Other Ambulatory Visit: Payer: Self-pay

## 2018-01-02 VITALS — BP 134/78 | HR 59 | Temp 98.2°F | Ht 60.0 in | Wt 141.2 lb

## 2018-01-02 DIAGNOSIS — G8929 Other chronic pain: Secondary | ICD-10-CM | POA: Diagnosis not present

## 2018-01-02 DIAGNOSIS — H409 Unspecified glaucoma: Secondary | ICD-10-CM | POA: Diagnosis not present

## 2018-01-02 DIAGNOSIS — I10 Essential (primary) hypertension: Secondary | ICD-10-CM | POA: Diagnosis not present

## 2018-01-02 DIAGNOSIS — R040 Epistaxis: Secondary | ICD-10-CM | POA: Diagnosis not present

## 2018-01-02 DIAGNOSIS — M25562 Pain in left knee: Secondary | ICD-10-CM | POA: Diagnosis not present

## 2018-01-02 MED ORDER — CITALOPRAM HYDROBROMIDE 20 MG PO TABS: 20.0000 mg | ORAL_TABLET | Freq: Every day | ORAL | 0 refills | Status: DC

## 2018-01-02 MED ORDER — AMLODIPINE BESYLATE 5 MG PO TABS: ORAL_TABLET | ORAL | 0 refills | Status: DC

## 2018-01-02 MED ORDER — DICLOFENAC SODIUM 1 % TD GEL: 4.0000 g | Freq: Four times a day (QID) | TRANSDERMAL | 0 refills | Status: DC

## 2018-01-02 MED ORDER — TRAMADOL HCL 50 MG PO TABS: 100.0000 mg | ORAL_TABLET | Freq: Two times a day (BID) | ORAL | 0 refills | Status: DC | PRN

## 2018-01-02 NOTE — Patient Instructions (Signed)
It was great seeing you today! I am sorry that your knee has been hurting. I think it is due to your chronic arthritis flaring up. Likely this is due to increased activity and the weather. I recommend continuing to take your scheduled tramadol and tylenol. I will also refill your voltaren gel prescription.  I am glad that things went well with dr. Gwenlyn Found. He is trying to see if you will get insurance approval for a new cholesterol medication.  I am sorry that you have been having trouble seeing. I am glad that you have seen an eye specialist. I wish that your MRI was scheduled a little sooner, but am glad that you do have one scheduled.  I have added your eye drops to your medication list. I also gave you a refill for the citalopram, tramadol, and amlodipine.

## 2018-01-07 DIAGNOSIS — H409 Unspecified glaucoma: Secondary | ICD-10-CM | POA: Diagnosis not present

## 2018-01-07 DIAGNOSIS — H5340 Unspecified visual field defects: Secondary | ICD-10-CM | POA: Diagnosis not present

## 2018-01-09 ENCOUNTER — Encounter: Payer: Self-pay | Admitting: Family Medicine

## 2018-01-09 ENCOUNTER — Other Ambulatory Visit: Payer: Self-pay | Admitting: *Deleted

## 2018-01-09 MED ORDER — BUSPIRONE HCL 5 MG PO TABS: 5.0000 mg | ORAL_TABLET | Freq: Three times a day (TID) | ORAL | 3 refills | Status: DC

## 2018-01-09 NOTE — Assessment & Plan Note (Signed)
Has resolved. Patient's opthomologist thinks it could all be related to her vision changes. Will follow up MRI. Glad that she already has specialist involvement.

## 2018-01-09 NOTE — Assessment & Plan Note (Signed)
>>  ASSESSMENT AND PLAN FOR GLAUCOMA WRITTEN ON 01/09/2018  8:21 AM BY FLETCHER, JACOB, MD  Appears to possibly have primary open angle glaucoma. Has seen [facility] for this. MRI ordered. Has eyes drops in place. Will continue to follow.

## 2018-01-09 NOTE — Progress Notes (Signed)
   HPI 77 year old who presents for bilateral chronic knee pain. She has known OA and attributes her knee pain to the weather changing. Tylenol has helped some for it in the past. Says that the voltaren gel has greatly helped her knee pain as well.  Patient has been having recent hemianopsia towards the lateral field of vision of her left eye. She has seen an opthomologist at [facility] who recommended getting an mri of her brain. This is scheduled in august.  She recently saw her cardiologist who is going to try and get her on another cholesterol medication but otherwise the visit "went fine".  CC: knee pain   ROS:   Review of Systems See HPI for ROS.   CC, SH/smoking status, and VS noted  Objective: BP 134/78   Pulse (!) 59   Temp 98.2 F (36.8 C) (Oral)   Ht 5' (1.524 m)   Wt 141 lb 3.2 oz (64 kg)   SpO2 96%   BMI 27.58 kg/m  Gen:NAD, alert, cooperative, and pleasant. Elderly caucasian female. HEENT:NCAT, EOMI, PERRL. Small abrasion in left nostril. CV:RRR, no murmur Resp:CTAB, no wheezes, non-labored. Lungs clear to ausculation. MOQ:HUTML, BS present, no guarding or organomegaly Ext:No edema, warm. Dermatitis from venous stasis noted in BLE. Neuro:Alert and oriented, Speech clear, No gross deficits RLE: Improved RLE, now closed. Healing well. There is no surrounding erythema and no symptoms consistent with cellulitis or infection.   Assessment and plan:  Hypertension Will continue norvasc 5mg . Refill given.  Knee pain, left Refilled script for voltaren gel. Encouraged tylenol as well. Likely all due to OA.  Bleeding from the nose Has resolved. Patient's opthomologist thinks it could all be related to her vision changes. Will follow up MRI. Glad that she already has specialist involvement.  Glaucoma Appears to possibly have primary open angle glaucoma. Has seen [facility] for this. MRI ordered. Has eyes drops in place. Will continue to follow.   No  orders of the defined types were placed in this encounter.   Meds ordered this encounter  Medications  . DISCONTD: amLODipine (NORVASC) 5 MG tablet    Sig: TAKE 1 TABLET(5 MG) BY MOUTH DAILY    Dispense:  90 tablet    Refill:  0    **Patient requests 90 days supply**  . citalopram (CELEXA) 20 MG tablet    Sig: Take 1 tablet (20 mg total) by mouth daily.    Dispense:  90 tablet    Refill:  0  . traMADol (ULTRAM) 50 MG tablet    Sig: Take 2 tablets (100 mg total) by mouth 2 (two) times daily as needed for moderate pain.    Dispense:  60 tablet    Refill:  0  . diclofenac sodium (VOLTAREN) 1 % GEL    Sig: Apply 4 g topically 4 (four) times daily.    Dispense:  100 g    Refill:  0  . amLODipine (NORVASC) 5 MG tablet    Sig: TAKE 1 TABLET(5 MG) BY MOUTH DAILY    Dispense:  90 tablet    Refill:  0    **Patient requests 90 days supply**     [name] MD PGY-1 Family Medicine Resident  01/09/2018 8:21 AM

## 2018-01-09 NOTE — Assessment & Plan Note (Signed)
Refilled script for voltaren gel. Encouraged tylenol as well. Likely all due to OA.

## 2018-01-09 NOTE — Assessment & Plan Note (Signed)
Appears to possibly have primary open angle glaucoma. Has seen [facility] for this. MRI ordered. Has eyes drops in place. Will continue to follow.

## 2018-01-09 NOTE — Assessment & Plan Note (Signed)
Will continue norvasc 5mg . Refill given.

## 2018-01-11 ENCOUNTER — Other Ambulatory Visit: Payer: Self-pay | Admitting: Pharmacist Clinician (PhC)/ Clinical Pharmacy Specialist

## 2018-01-11 MED ORDER — EVOLOCUMAB 140 MG/ML ~~LOC~~ SOAJ: 140.0000 mg | SUBCUTANEOUS | 12 refills | Status: DC

## 2018-02-15 ENCOUNTER — Other Ambulatory Visit: Payer: Self-pay

## 2018-02-15 NOTE — Telephone Encounter (Signed)
Patient called and has moved to [place]. They are working on transferring her prescriptions but cannot transfer her Tramadol and Plavix. Please send to [facility] in [place].  Danley Danker, RN [facility] RN)

## 2018-02-18 DIAGNOSIS — H401133 Primary open-angle glaucoma, bilateral, severe stage: Secondary | ICD-10-CM | POA: Diagnosis not present

## 2018-02-18 DIAGNOSIS — H5359 Other color vision deficiencies: Secondary | ICD-10-CM | POA: Diagnosis not present

## 2018-02-18 DIAGNOSIS — H53452 Other localized visual field defect, left eye: Secondary | ICD-10-CM | POA: Diagnosis not present

## 2018-02-18 MED ORDER — CLOPIDOGREL BISULFATE 75 MG PO TABS: 75.0000 mg | ORAL_TABLET | Freq: Every morning | ORAL | 3 refills | Status: DC

## 2018-02-18 MED ORDER — TRAMADOL HCL 50 MG PO TABS: 100.0000 mg | ORAL_TABLET | Freq: Two times a day (BID) | ORAL | 0 refills | Status: DC | PRN

## 2018-02-21 DIAGNOSIS — L304 Erythema intertrigo: Secondary | ICD-10-CM | POA: Diagnosis not present

## 2018-02-21 DIAGNOSIS — Z85828 Personal history of other malignant neoplasm of skin: Secondary | ICD-10-CM | POA: Diagnosis not present

## 2018-03-05 ENCOUNTER — Telehealth (HOSPITAL_COMMUNITY): Payer: Self-pay | Admitting: Cardiovascular Disease

## 2018-03-05 NOTE — Telephone Encounter (Signed)
Will forward to Pharm D for review  

## 2018-03-05 NOTE — Telephone Encounter (Signed)
Returned call to patient, she states she got a Quarry manager from insurance stating they will cover [place].   She has not started yet but was wondering when she needs to start and get repeat lab work.  Advised would send message to lipid clinic to advise.

## 2018-03-05 NOTE — Telephone Encounter (Signed)
We will repeat blood work around the 5th dose of Repatha.   We are working on getting the medication to her. No need for new order until she start therapy.

## 2018-03-05 NOTE — Telephone Encounter (Signed)
New Message        Patient is calling too see when she can come in again for her lipids, she states her insurance is going to help her pay for her self-injections. Pls advise

## 2018-03-08 NOTE — Telephone Encounter (Signed)
Attempt to return call x 3-# now states "# has been changed, disconnected, or no longer in service".

## 2018-03-15 DIAGNOSIS — B372 Candidiasis of skin and nail: Secondary | ICD-10-CM | POA: Diagnosis not present

## 2018-03-15 DIAGNOSIS — R410 Disorientation, unspecified: Secondary | ICD-10-CM | POA: Diagnosis not present

## 2018-03-15 DIAGNOSIS — I10 Essential (primary) hypertension: Secondary | ICD-10-CM | POA: Diagnosis not present

## 2018-03-15 DIAGNOSIS — R079 Chest pain, unspecified: Secondary | ICD-10-CM | POA: Diagnosis not present

## 2018-03-15 DIAGNOSIS — R21 Rash and other nonspecific skin eruption: Secondary | ICD-10-CM | POA: Diagnosis not present

## 2018-03-15 DIAGNOSIS — R531 Weakness: Secondary | ICD-10-CM | POA: Diagnosis not present

## 2018-03-15 DIAGNOSIS — L03114 Cellulitis of left upper limb: Secondary | ICD-10-CM | POA: Diagnosis not present

## 2018-03-15 DIAGNOSIS — R42 Dizziness and giddiness: Secondary | ICD-10-CM | POA: Diagnosis not present

## 2018-03-15 DIAGNOSIS — R0602 Shortness of breath: Secondary | ICD-10-CM | POA: Diagnosis not present

## 2018-03-22 ENCOUNTER — Encounter: Payer: Self-pay | Admitting: Licensed Clinical Social Worker

## 2018-03-22 ENCOUNTER — Other Ambulatory Visit: Payer: Self-pay

## 2018-03-22 ENCOUNTER — Ambulatory Visit (INDEPENDENT_AMBULATORY_CARE_PROVIDER_SITE_OTHER): Payer: PPO | Admitting: Family Medicine

## 2018-03-22 ENCOUNTER — Encounter: Payer: Self-pay | Admitting: Family Medicine

## 2018-03-22 ENCOUNTER — Ambulatory Visit (INDEPENDENT_AMBULATORY_CARE_PROVIDER_SITE_OTHER): Payer: PPO | Admitting: Licensed Clinical Social Worker

## 2018-03-22 VITALS — BP 138/72 | HR 79 | Temp 98.3°F | Ht 60.0 in | Wt 141.6 lb

## 2018-03-22 DIAGNOSIS — F332 Major depressive disorder, recurrent severe without psychotic features: Secondary | ICD-10-CM

## 2018-03-22 DIAGNOSIS — Z658 Other specified problems related to psychosocial circumstances: Secondary | ICD-10-CM

## 2018-03-22 DIAGNOSIS — Z23 Encounter for immunization: Secondary | ICD-10-CM

## 2018-03-22 DIAGNOSIS — Z79899 Other long term (current) drug therapy: Secondary | ICD-10-CM | POA: Diagnosis not present

## 2018-03-22 NOTE — Patient Instructions (Signed)
Was great seeing you again Ms. Molony!  Sorry you had so much trouble recently.  I understand you are over at [facility], but unfortunately I do not have these records so I cannot say whether you are on the proper treatment or not.  I will call him to get those records faxed over today.  We went through all your medications today, and I believe you are on the right medication regiment for most of her problems.  There is a mystery medication with no label, so I will call your pharmacy and we will about which medication this is.  I think a lot of your problems stand from your living situation, I think getting a home health aide/nurse to come out a couple times per week to administer medications will be very helpful.  I will also get you in touch with [name] our behavioral health health specialist to help with coping mechanisms for this difficult living situation and see if she has any recommendations as far as getting out of situation.
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
## 2018-03-23 ENCOUNTER — Other Ambulatory Visit: Payer: Self-pay | Admitting: Family Medicine

## 2018-03-23 DIAGNOSIS — K219 Gastro-esophageal reflux disease without esophagitis: Secondary | ICD-10-CM

## 2018-03-25 ENCOUNTER — Other Ambulatory Visit: Payer: Self-pay | Admitting: Family Medicine

## 2018-03-25 ENCOUNTER — Other Ambulatory Visit: Payer: Self-pay

## 2018-03-25 DIAGNOSIS — K219 Gastro-esophageal reflux disease without esophagitis: Secondary | ICD-10-CM

## 2018-03-25 MED ORDER — TRAMADOL HCL 50 MG PO TABS: ORAL_TABLET | ORAL | 0 refills | Status: DC

## 2018-03-25 MED ORDER — DICLOFENAC SODIUM 1 % TD GEL: 4.0000 g | Freq: Four times a day (QID) | TRANSDERMAL | 0 refills | Status: DC

## 2018-03-26 ENCOUNTER — Other Ambulatory Visit: Payer: Self-pay

## 2018-03-26 ENCOUNTER — Encounter: Payer: Self-pay | Admitting: Family Medicine

## 2018-03-26 NOTE — Assessment & Plan Note (Addendum)
Patient has not been taking her prescribed Celexa or buspirone due to inability to have anyone pick this up for her.  Patient was initially very tearful during visit.  Able to identify root of most of anxiety and living situation.  In order to help patient get her medications, [facility] referral appropriate.  Refer patient to behavioral health, please see note
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
## 2018-03-26 NOTE — Patient Outreach (Signed)
[facility]) Care Management  03/26/2018  [name] [name] [birth year] [id number]   Referral Date: 03/26/18 Referral Source: MD referral Referral Reason: medication management and help in the home.   Outreach Attempt: No answer.  Unable to leave a message.      Plan: RN CM will send letter and attempt patient again within the next 4 business days.   Jone Baseman, RN, MSN [facility] Care Management Care Management Coordinator Direct Line [phone] Toll Free: [phone]  Fax: [phone]

## 2018-03-26 NOTE — Assessment & Plan Note (Signed)
Reviewed all medications with patient.  Some medically necessary reason to be on each of them.  Notable exception his lamotrigine, which was discontinued several visits ago.  Patient also had unlabeled medication, she is unsure which medications was.  This was also discarded.

## 2018-03-27 ENCOUNTER — Telehealth: Payer: Self-pay | Admitting: *Deleted

## 2018-03-27 ENCOUNTER — Other Ambulatory Visit: Payer: Self-pay

## 2018-03-27 NOTE — Patient Outreach (Signed)
[facility] [facility]) Care Management  03/27/2018  [name] [name] Dec 15, 1940 [id number]   Referral Date:03/27/18 Referral Source: MD referral Referral Reason: medication management and personal care   Outreach Attempt: spoke with patient.  She is able to verify HIPAA.  Discussed referral with patient and [facility] services.   She is welcoming of help.  Patient shares that she lost her house and is tearful about it.  So she is now living in an apartment and her daughter lives their with her boyfriend but patient is not happy about that.  Patient reports that she is needing assistance with bathing and dressing now but states she has a policy that covers personal care but has to get papers together for it.  She states she has been in contact with her agent about it.  Patient also states she wants to change her advanced directive and other papers and is in need of assistance with that.  Patient is also interested in income based apartments in her area.   Patient admits to HTN, GERD, glaucoma, macular degeneration, depression and panic attacks.  Patient denies any problems with managing her BP.  However, patient states that her panic attacks bother her along with her depression related to her current situation.  Patient to see Psychiatrist on next week for routine appointment.    Patient does not take her medications as prescribed as she has problems remembering her medications and if she took and is requesting some help with medication management.    Plan: RN CM will refer patient to social work for assist with resources for care, housing needs, depression, advanced directive changes.   RN CM will refer to pharmacy for medication management.  Jone Baseman, RN, MSN [facility] Care Management Care Management Coordinator Direct Line [phone] Toll Free: [phone]  Fax: [phone]

## 2018-03-27 NOTE — Telephone Encounter (Signed)
Pt wants to know if MD had a chance to review the images on the disk she brought to her appt. Cambry Spampinato, Salome Spotted, CMA

## 2018-03-28 ENCOUNTER — Other Ambulatory Visit: Payer: Self-pay | Admitting: Family Medicine

## 2018-03-28 ENCOUNTER — Other Ambulatory Visit: Payer: Self-pay | Admitting: Pharmacist

## 2018-03-28 DIAGNOSIS — K219 Gastro-esophageal reflux disease without esophagitis: Secondary | ICD-10-CM

## 2018-03-29 NOTE — Patient Outreach (Signed)
[name]) Care Management  [name]   03/29/2018  [name] [name] 09-Feb-1941 [id number]  Subjective: Patient was called regarding medication assistance. HIPAA identifiers were obtained. Patient is a 77 year old female with multiple medical conditions including but not limited to:  GERD, glaucoma, hyperlipidemia, cervical spondylosis with radiculopathy, hypertension, lumbar back pain, OSA, osteoporosis, PVD, polymyalgia rheumatica, and anxiety.  Patient reported issues affording her eye drops.   Objective:   Encounter Medications: Outpatient Encounter Medications as of 03/28/2018  Medication Sig  . acetaminophen (TYLENOL) 500 MG tablet Take 1,000 mg by mouth every 6 (six) hours as needed for moderate pain or headache.  [name] amLODipine (NORVASC) 5 MG tablet TAKE 1 TABLET(5 MG) BY MOUTH DAILY  . aspirin EC 81 MG EC tablet Take 1 tablet (81 mg total) by mouth daily.  . Aspirin-Salicylamide-Caffeine (BC HEADACHE POWDER PO) Take 1 packet by mouth 2 (two) times daily.  . brimonidine (ALPHAGAN) 0.15 % ophthalmic solution Place 1 drop into both eyes 2 (two) times daily.  . brimonidine-timolol (COMBIGAN) 0.2-0.5 % ophthalmic solution Place 1 drop into both eyes every 12 (twelve) hours.  . cholecalciferol (VITAMIN D) 1000 units tablet Take 1,000 Units by mouth daily.  . citalopram (CELEXA) 20 MG tablet Take 1 tablet (20 mg total) by mouth daily.  . clopidogrel (PLAVIX) 75 MG tablet Take 1 tablet (75 mg total) by mouth every morning.  . Cyanocobalamin (VITAMIN B 12 PO) Take 1 tablet by mouth daily.  . diclofenac sodium (VOLTAREN) 1 % GEL Apply 4 g topically 4 (four) times daily.  [name] docusate sodium (COLACE) 100 MG capsule Take 100 mg by mouth 2 (two) times daily as needed for mild constipation.  . Dorzolamide HCl-Timolol Mal (COSOPT OP) Apply 1 drop to eye 2 (two) times daily.  . Evolocumab (REPATHA SURECLICK) 371 MG/ML SOAJ Inject 140 mg into the skin every 14 (fourteen) days.   [name] lamoTRIgine (LAMICTAL) 25 MG tablet Take 2 tablets by mouth daily.  . Latanoprostene Bunod (VYZULTA) 0.024 % SOLN Place 1 drop into both eyes at bedtime.  . metoprolol tartrate (LOPRESSOR) 25 MG tablet Take 0.5 tablets (12.5 mg total) by mouth 2 (two) times daily.  . mirtazapine (REMERON) 15 MG tablet Take 15 mg by mouth at bedtime.  . Multiple Vitamin (MULTIVITAMIN WITH MINERALS) TABS tablet Take 1 tablet by mouth daily.  [name] nystatin cream (MYCOSTATIN) Apply to affected area twice dailiy  . pantoprazole (PROTONIX) 20 MG tablet TAKE 1 TABLET(20 MG) BY MOUTH DAILY  . PARoxetine (PAXIL) 20 MG tablet Take 1 tablet by mouth daily.  [name] (SYSTANE) 0.4-0.3 % GEL ophthalmic gel Apply 1 drop to eye 2 (two) times daily.  . traMADol (ULTRAM) 50 MG tablet TAKE 2 TABLETS(100 MG) BY MOUTH TWICE DAILY AS NEEDED FOR MODERATE PAIN  . busPIRone (BUSPAR) 5 MG tablet Take 1 tablet (5 mg total) by mouth 3 (three) times daily. (Patient not taking: Reported on 03/28/2018)  . [DISCONTINUED] Cholecalciferol (VITAMIN D3) 1000 units CAPS 1,000 mg daily.  . [DISCONTINUED] loxapine (LOXITANE) 10 MG capsule Take 1 capsule by mouth every morning.  . [DISCONTINUED] Multiple Vitamins-Minerals (THERA-M) TABS Take 1 tablet by mouth daily.  . [DISCONTINUED] traZODone (DESYREL) 100 MG tablet TAKE 1/2-1 PO QHS AS NEEDED FOR INSOMNIA   No facility-administered encounter medications on file as of 03/28/2018.     Functional Status: In your present state of health, do you have any difficulty performing the following activities: 09/03/2017  Hearing? N  Vision? N  Difficulty concentrating or making decisions? N  Walking or climbing stairs? Y  Dressing or bathing? N  Some recent data might be hidden    Fall/Depression Screening: Fall Risk  01/02/2018 12/07/2017 11/28/2017  Falls in the past year? No No No  Comment - - -  Number falls in past yr: - - -  Injury with Fall? - - -  Follow up - - -   PHQ  2/9 Scores 03/27/2018 03/22/2018 01/02/2018 12/07/2017 11/08/2017 09/25/2017 08/10/2017  PHQ - 2 Score 6 6 0 0 5 5 1   PHQ- 9 Score 20 21 - - - 17 -      Assessment: An attempt was made to reconcile the patient's medications but the patient was unable to review her medications.   [facility] was called and Surescripts fill history data was reviewed.    The patient could not confirm several medications.  In addition, patient requested patient assistance for her eye drops but said she did not think she could get copies of her financial paperwork.   Plan: Home visit with the patient to review her medications and scan her paperwork for patient assistance.   Elayne Guerin, PharmD, [place] Clinical Pharmacist [phone]

## 2018-04-01 ENCOUNTER — Other Ambulatory Visit: Payer: Self-pay | Admitting: *Deleted

## 2018-04-01 IMAGING — DX DG CHEST 1V
1 series · 1 of 1 positions shown · non-contrast
Comparison: Portable exam 7348 hours compared to 08/26/2016

CLINICAL DATA: Psychiatric evaluation, history hypertension, former
smoker, chronic kidney disease stage III with congenital absence of
LEFT kidney, GERD

EXAM:
CHEST 1 VIEW

[chest ap]
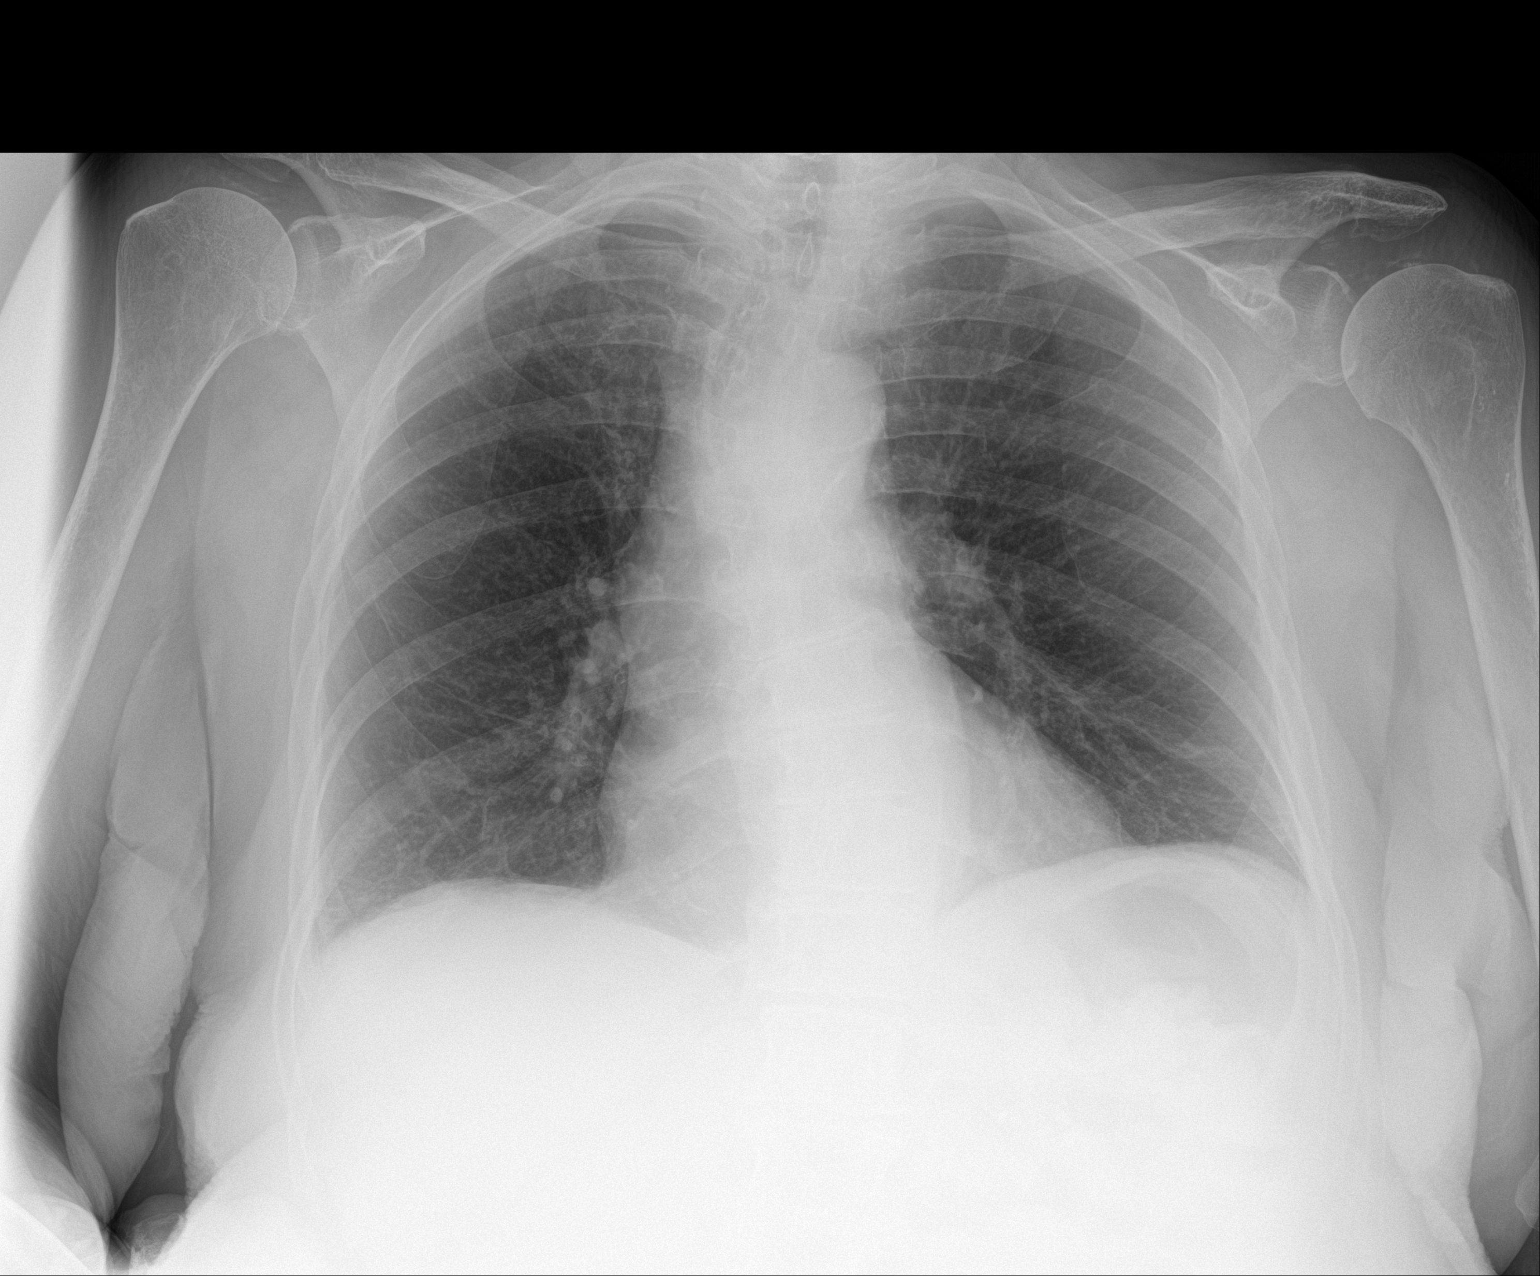

[1 of 1 positions shown; findings below may reference images not displayed]

FINDINGS: Normal heart size, mediastinal contours, and pulmonary vascularity.

Atherosclerotic calcification aorta.

Minimal bibasilar atelectasis.

Lungs otherwise clear.

Minimal chronic peribronchial thickening noted.

No pleural effusion or pneumothorax.

Bones demineralized with evidence of prior cervical spine fusion.
IMPRESSION: Minimal chronic bronchitic changes and bibasilar atelectasis.

## 2018-04-01 NOTE — Patient Outreach (Signed)
[facility]) Care Management  04/01/2018  [name] [name] [birth year] [id number]   CSW contacted pt by phone today. Pt's identity was confirmed and CSW introduced self and role with [facility] and reason for call. Pt reports she is supposed to be using a "leg machine" but does not know how to use it- she is wanting someone to teach her daughter who she is living with [name] [name]) how to use it. She also thinks she has an Set designer that covers in home help; "that's all it covers" and is not sure what it is. CSW also spoke with daughter, [name] [name], who states she is not sure either but agrees to look for it and call me back with info.  Pt admits to having depression and "bipolar" and states she has run out of her RX's.  She plans to see her Counselor, "[name] [name]" this week.  CSW noted [facility] RPH is following as well and will make aware of this concern. Pt denies any SI or HI at this time. Pt reports her daughter, [name] [name], is her HCPOA and denied any interest in changing the documents.   CSW discussed possible community resources to assist with personal care in the home and will consider a referral if her insurance policy is not figured out in the next week. CSW provided name and # for callback if needs arise as well as for the insurance policy info once they locate it.   CSW will plan a f/u call next week for further assessment and assistance.   [name] [name], MSW, [name] Worker  [place] [phone]

## 2018-04-02 ENCOUNTER — Other Ambulatory Visit: Payer: Self-pay

## 2018-04-02 ENCOUNTER — Other Ambulatory Visit: Payer: Self-pay | Admitting: Pharmacist

## 2018-04-02 NOTE — Telephone Encounter (Signed)
Nurse Linus Galas called from [facility] she is doing a home visit and noticed pt is completely out of Buspar. Please send Rx to [facility] in [place] on [address] can be reached at [phone]. Ottis Stain, CMA

## 2018-04-02 NOTE — Patient Outreach (Addendum)
[facility]) Care Management  [place]   04/02/2018  [name] [name] 02-23-1941 [id number]  Subjective: Home visit completed at the patient's home. HIPAA identifiers were obtained.   Patient is a 77 year old female with multiple medical conditions including but not limited to:  GERD, glaucoma, hyperlipidemia, cervical spondylosis with radiculopathy, hypertension, lumbar back pain, OSA, osteoporosis, PVD, polymyalgia rheumatica, and anxiety.   Objective:   Encounter Medications: Outpatient Encounter Medications as of 04/02/2018  Medication Sig  . acetaminophen (TYLENOL) 500 MG tablet Take 1,000 mg by mouth every 6 (six) hours as needed for moderate pain or headache.  [name] amLODipine (NORVASC) 5 MG tablet TAKE 1 TABLET(5 MG) BY MOUTH DAILY  . aspirin EC 81 MG EC tablet Take 1 tablet (81 mg total) by mouth daily.  . Aspirin-Salicylamide-Caffeine (BC HEADACHE POWDER PO) Take 1 packet by mouth 2 (two) times daily.  . brimonidine (ALPHAGAN) 0.15 % ophthalmic solution Place 1 drop into both eyes 2 (two) times daily.  . busPIRone (BUSPAR) 15 MG tablet Take 15 mg by mouth 3 (three) times daily.  . carboxymethylcellulose (REFRESH PLUS) 0.5 % SOLN 1 drop daily as needed.  . cholecalciferol (VITAMIN D) 1000 units tablet Take 1,000 Units by mouth daily.  . citalopram (CELEXA) 20 MG tablet Take 1 tablet (20 mg total) by mouth daily.  . clopidogrel (PLAVIX) 75 MG tablet Take 1 tablet (75 mg total) by mouth every morning.  . Cyanocobalamin (VITAMIN B 12 PO) Take 1 tablet by mouth daily.  . diclofenac sodium (VOLTAREN) 1 % GEL Apply 4 g topically 4 (four) times daily.  [name] docusate sodium (COLACE) 100 MG capsule Take 100 mg by mouth 2 (two) times daily as needed for mild constipation.  . Dorzolamide HCl-Timolol Mal (COSOPT OP) Apply 1 drop to eye 2 (two) times daily.  . Evolocumab (REPATHA SURECLICK) 295 MG/ML SOAJ Inject 140 mg into the skin every 14 (fourteen) days.  . Latanoprostene  Bunod (VYZULTA) 0.024 % SOLN Place 1 drop into both eyes at bedtime.  . metoprolol tartrate (LOPRESSOR) 25 MG tablet Take 0.5 tablets (12.5 mg total) by mouth 2 (two) times daily.  . mirtazapine (REMERON) 15 MG tablet Take 15 mg by mouth at bedtime.  . Multiple Vitamin (MULTIVITAMIN WITH MINERALS) TABS tablet Take 1 tablet by mouth daily.  [name] nystatin cream (MYCOSTATIN) Apply to affected area twice dailiy  . pantoprazole (PROTONIX) 20 MG tablet TAKE 1 TABLET(20 MG) BY MOUTH DAILY  . polyethylene glycol (MIRALAX / GLYCOLAX) packet Take 17 g by mouth daily.  . traMADol (ULTRAM) 50 MG tablet TAKE 2 TABLETS(100 MG) BY MOUTH TWICE DAILY AS NEEDED FOR MODERATE PAIN  . traZODone (DESYREL) 100 MG tablet Take 1 tablet by mouth at bedtime as needed for sleep.  . [DISCONTINUED] brimonidine-timolol (COMBIGAN) 0.2-0.5 % ophthalmic solution Place 1 drop into both eyes every 12 (twelve) hours.  . [DISCONTINUED] busPIRone (BUSPAR) 5 MG tablet Take 1 tablet (5 mg total) by mouth 3 (three) times daily. (Patient not taking: Reported on 03/28/2018)  . [DISCONTINUED] lamoTRIgine (LAMICTAL) 25 MG tablet Take 2 tablets by mouth daily.  . [DISCONTINUED] Multiple Vitamin (MULTI-VITAMINS) TABS Take 1 tablet by mouth daily.  . [DISCONTINUED] PARoxetine (PAXIL) 20 MG tablet Take 1 tablet by mouth daily.  . [DISCONTINUED] Polyethyl Glycol-Propyl Glycol (SYSTANE) 0.4-0.3 % GEL ophthalmic gel Apply 1 drop to eye 2 (two) times daily.   No facility-administered encounter medications on file as of 04/02/2018.     Functional  Status: In your present state of health, do you have any difficulty performing the following activities: 09/03/2017  Hearing? N  Vision? N  Difficulty concentrating or making decisions? N  Walking or climbing stairs? Y  Dressing or bathing? N  Some recent data might be hidden    Fall/Depression Screening: Fall Risk  01/02/2018 12/07/2017 11/28/2017  Falls in the past year? No No No  Comment - - -  Number  falls in past yr: - - -  Injury with Fall? - - -  Follow up - - -   PHQ 2/9 Scores 03/27/2018 03/22/2018 01/02/2018 12/07/2017 11/08/2017 09/25/2017 08/10/2017  PHQ - 2 Score 6 6 0 0 5 5 1   PHQ- 9 Score 20 21 - - - 17 -      Assessment: Patient's medications were reviewed in her home.   Drugs sorted by system:  Neurologic/Psychologic: Buspirone, Citalopram, Mirtazapine  Cardiovascular: Amlodipine, EC ASA, Clopidogrel, Metoprolol, Repatha,   Gastrointestinal: Pantoprazole, Polyethylene glycol, Docusate,   Topical: Nystatin cream, Diclofenac gel 1%  Pain: Acetaminophen, Tramadol, BC Headache Powder  Vitamins/Minerals: Cyanocobalamin, Cholecalciferol, Multiple Vitamin  Miscellaneous: Brimonidine, Carboxymethylcellulose, Dorzolamide/Timolol, Vyzulta,    Medication Review Findings;  Adherence- Patient said she is out of Buspirone. A phone call was placed to Dr. Dorise Bullion office to request a refill be called into the patient's pharmacy.  Pharmacy was called on 09/225/19--[facility] said they attempted to get a transfer from [facility] but was unable to do so because it had not been called in.  [facility] was called back today.   Trazodone-patient had this medication in her possession but said she is not taking it.   Medication Assistance Findings: -patient is over income for the Extra Help Program  -Applications were completed for Vyzulta ([place]) and Allergan (Alphagan P- (brimonidine 0.1%)--patient's provider will have a note sent to him about changing from the Brimonidine 0.15% to 0.1% as well as to request signature's on the applications. (Dr. Ander Slade)  Patient was set up with [facility] as her new pharmacy as they deliver and she has transportation issues.  Once delivery is set up, I will work with the patient to go to blister packing.   Plan: Follow up on this ASAP. Route note to PCP. Call patient back within 1-2 business days.   Elayne Guerin, PharmD, [place] Clinical Pharmacist [phone]

## 2018-04-03 ENCOUNTER — Encounter (HOSPITAL_COMMUNITY): Payer: Self-pay | Admitting: Pharmacy Technician

## 2018-04-03 ENCOUNTER — Other Ambulatory Visit: Payer: Self-pay

## 2018-04-03 ENCOUNTER — Observation Stay (HOSPITAL_COMMUNITY)
Admission: EM | Admit: 2018-04-03 | Discharge: 2018-04-09 | Disposition: A | Discharge status: Skilled Nursing Facility | Payer: PPO | Attending: Family Medicine | Admitting: Family Medicine

## 2018-04-03 ENCOUNTER — Emergency Department (HOSPITAL_COMMUNITY): Payer: PPO

## 2018-04-03 ENCOUNTER — Ambulatory Visit (INDEPENDENT_AMBULATORY_CARE_PROVIDER_SITE_OTHER): Payer: PPO | Admitting: Licensed Clinical Social Worker

## 2018-04-03 DIAGNOSIS — M353 Polymyalgia rheumatica: Secondary | ICD-10-CM | POA: Insufficient documentation

## 2018-04-03 DIAGNOSIS — I129 Hypertensive chronic kidney disease with stage 1 through stage 4 chronic kidney disease, or unspecified chronic kidney disease: Secondary | ICD-10-CM | POA: Diagnosis not present

## 2018-04-03 DIAGNOSIS — M810 Age-related osteoporosis without current pathological fracture: Secondary | ICD-10-CM | POA: Insufficient documentation

## 2018-04-03 DIAGNOSIS — D649 Anemia, unspecified: Secondary | ICD-10-CM | POA: Diagnosis not present

## 2018-04-03 DIAGNOSIS — I959 Hypotension, unspecified: Secondary | ICD-10-CM | POA: Insufficient documentation

## 2018-04-03 DIAGNOSIS — R197 Diarrhea, unspecified: Secondary | ICD-10-CM | POA: Insufficient documentation

## 2018-04-03 DIAGNOSIS — K219 Gastro-esophageal reflux disease without esophagitis: Secondary | ICD-10-CM | POA: Insufficient documentation

## 2018-04-03 DIAGNOSIS — R7309 Other abnormal glucose: Secondary | ICD-10-CM | POA: Diagnosis not present

## 2018-04-03 DIAGNOSIS — R45850 Homicidal ideations: Secondary | ICD-10-CM | POA: Diagnosis not present

## 2018-04-03 DIAGNOSIS — E7800 Pure hypercholesterolemia, unspecified: Secondary | ICD-10-CM | POA: Insufficient documentation

## 2018-04-03 DIAGNOSIS — N183 Chronic kidney disease, stage 3 (moderate): Secondary | ICD-10-CM | POA: Insufficient documentation

## 2018-04-03 DIAGNOSIS — E785 Hyperlipidemia, unspecified: Secondary | ICD-10-CM | POA: Diagnosis not present

## 2018-04-03 DIAGNOSIS — Q600 Renal agenesis, unilateral: Secondary | ICD-10-CM | POA: Insufficient documentation

## 2018-04-03 DIAGNOSIS — Z79899 Other long term (current) drug therapy: Secondary | ICD-10-CM | POA: Insufficient documentation

## 2018-04-03 DIAGNOSIS — I739 Peripheral vascular disease, unspecified: Secondary | ICD-10-CM | POA: Diagnosis not present

## 2018-04-03 DIAGNOSIS — R410 Disorientation, unspecified: Secondary | ICD-10-CM

## 2018-04-03 DIAGNOSIS — S0990XA Unspecified injury of head, initial encounter: Secondary | ICD-10-CM | POA: Diagnosis not present

## 2018-04-03 DIAGNOSIS — D709 Neutropenia, unspecified: Secondary | ICD-10-CM | POA: Insufficient documentation

## 2018-04-03 DIAGNOSIS — R45851 Suicidal ideations: Secondary | ICD-10-CM | POA: Diagnosis not present

## 2018-04-03 DIAGNOSIS — Z658 Other specified problems related to psychosocial circumstances: Secondary | ICD-10-CM

## 2018-04-03 DIAGNOSIS — M069 Rheumatoid arthritis, unspecified: Secondary | ICD-10-CM | POA: Insufficient documentation

## 2018-04-03 DIAGNOSIS — Z87891 Personal history of nicotine dependence: Secondary | ICD-10-CM | POA: Insufficient documentation

## 2018-04-03 DIAGNOSIS — M25562 Pain in left knee: Secondary | ICD-10-CM | POA: Diagnosis not present

## 2018-04-03 DIAGNOSIS — M25552 Pain in left hip: Secondary | ICD-10-CM | POA: Insufficient documentation

## 2018-04-03 DIAGNOSIS — F329 Major depressive disorder, single episode, unspecified: Secondary | ICD-10-CM | POA: Insufficient documentation

## 2018-04-03 DIAGNOSIS — S8992XA Unspecified injury of left lower leg, initial encounter: Secondary | ICD-10-CM | POA: Diagnosis not present

## 2018-04-03 DIAGNOSIS — H409 Unspecified glaucoma: Secondary | ICD-10-CM | POA: Diagnosis not present

## 2018-04-03 DIAGNOSIS — M549 Dorsalgia, unspecified: Secondary | ICD-10-CM | POA: Diagnosis not present

## 2018-04-03 DIAGNOSIS — Z7902 Long term (current) use of antithrombotics/antiplatelets: Secondary | ICD-10-CM | POA: Insufficient documentation

## 2018-04-03 DIAGNOSIS — Z7982 Long term (current) use of aspirin: Secondary | ICD-10-CM | POA: Insufficient documentation

## 2018-04-03 DIAGNOSIS — F419 Anxiety disorder, unspecified: Secondary | ICD-10-CM | POA: Diagnosis not present

## 2018-04-03 DIAGNOSIS — G4733 Obstructive sleep apnea (adult) (pediatric): Secondary | ICD-10-CM | POA: Diagnosis not present

## 2018-04-03 DIAGNOSIS — N179 Acute kidney failure, unspecified: Secondary | ICD-10-CM | POA: Diagnosis not present

## 2018-04-03 DIAGNOSIS — G8929 Other chronic pain: Secondary | ICD-10-CM | POA: Diagnosis not present

## 2018-04-03 DIAGNOSIS — G629 Polyneuropathy, unspecified: Secondary | ICD-10-CM | POA: Diagnosis not present

## 2018-04-03 DIAGNOSIS — Z79891 Long term (current) use of opiate analgesic: Secondary | ICD-10-CM | POA: Insufficient documentation

## 2018-04-03 DIAGNOSIS — R627 Adult failure to thrive: Secondary | ICD-10-CM

## 2018-04-03 DIAGNOSIS — Z85828 Personal history of other malignant neoplasm of skin: Secondary | ICD-10-CM | POA: Insufficient documentation

## 2018-04-03 DIAGNOSIS — R531 Weakness: Secondary | ICD-10-CM

## 2018-04-03 DIAGNOSIS — S299XXA Unspecified injury of thorax, initial encounter: Secondary | ICD-10-CM | POA: Diagnosis not present

## 2018-04-03 LAB — URINALYSIS, ROUTINE W REFLEX MICROSCOPIC
Bilirubin Urine: NEGATIVE
Glucose, UA: NEGATIVE mg/dL
Hgb urine dipstick: NEGATIVE
Ketones, ur: 20 mg/dL — AB
Leukocytes, UA: NEGATIVE
Nitrite: NEGATIVE
Protein, ur: NEGATIVE mg/dL
Specific Gravity, Urine: 1.018 (ref 1.005–1.030)
pH: 5 (ref 5.0–8.0)

## 2018-04-03 LAB — CBC WITH DIFFERENTIAL/PLATELET
Abs Immature Granulocytes: 0 10*3/uL (ref 0.0–0.1)
Basophils Absolute: 0 10*3/uL (ref 0.0–0.1)
Basophils Relative: 1 %
Eosinophils Absolute: 0.1 10*3/uL (ref 0.0–0.7)
Eosinophils Relative: 2 %
HCT: 32.2 % — ABNORMAL LOW (ref 36.0–46.0)
Hemoglobin: 10.4 g/dL — ABNORMAL LOW (ref 12.0–15.0)
Immature Granulocytes: 0 %
Lymphocytes Relative: 25 %
Lymphs Abs: 1.3 10*3/uL (ref 0.7–4.0)
MCH: 33.4 pg (ref 26.0–34.0)
MCHC: 32.3 g/dL (ref 30.0–36.0)
MCV: 103.5 fL — ABNORMAL HIGH (ref 78.0–100.0)
Monocytes Absolute: 0.4 10*3/uL (ref 0.1–1.0)
Monocytes Relative: 8 %
Neutro Abs: 3.5 10*3/uL (ref 1.7–7.7)
Neutrophils Relative %: 64 %
Platelets: 183 10*3/uL (ref 150–400)
RBC: 3.11 MIL/uL — ABNORMAL LOW (ref 3.87–5.11)
RDW: 14.7 % (ref 11.5–15.5)
WBC: 5.3 10*3/uL (ref 4.0–10.5)

## 2018-04-03 LAB — COMPREHENSIVE METABOLIC PANEL
ALT: 12 U/L (ref 0–44)
AST: 25 U/L (ref 15–41)
Albumin: 3.9 g/dL (ref 3.5–5.0)
Alkaline Phosphatase: 53 U/L (ref 38–126)
Anion gap: 14 (ref 5–15)
BUN: 33 mg/dL — ABNORMAL HIGH (ref 8–23)
CO2: 22 mmol/L (ref 22–32)
Calcium: 11.7 mg/dL — ABNORMAL HIGH (ref 8.9–10.3)
Chloride: 106 mmol/L (ref 98–111)
Creatinine, Ser: 2.21 mg/dL — ABNORMAL HIGH (ref 0.44–1.00)
GFR calc Af Amer: 23 mL/min — ABNORMAL LOW (ref >60)
GFR calc non Af Amer: 20 mL/min — ABNORMAL LOW (ref >60)
Glucose, Bld: 88 mg/dL (ref 70–99)
Potassium: 4 mmol/L (ref 3.5–5.1)
Sodium: 142 mmol/L (ref 135–145)
Total Bilirubin: 0.8 mg/dL (ref 0.3–1.2)
Total Protein: 6.6 g/dL (ref 6.5–8.1)

## 2018-04-03 LAB — RAPID URINE DRUG SCREEN, HOSP PERFORMED
Amphetamines: NOT DETECTED
Barbiturates: NOT DETECTED
Benzodiazepines: NOT DETECTED
Cocaine: NOT DETECTED
Opiates: NOT DETECTED
Tetrahydrocannabinol: NOT DETECTED

## 2018-04-03 LAB — CBG MONITORING, ED: Glucose-Capillary: 86 mg/dL (ref 70–99)

## 2018-04-03 LAB — ETHANOL: Alcohol, Ethyl (B): <10 mg/dL (ref <10)

## 2018-04-03 LAB — POCT CBG (FASTING - GLUCOSE)-MANUAL ENTRY: Glucose Fasting, POC: 97 mg/dL (ref 70–99)

## 2018-04-03 MED ORDER — SODIUM CHLORIDE 0.9 % IV BOLUS
500.0000 mL | Freq: Once | INTRAVENOUS | Status: AC
  Administered 2018-04-03: 500 mL via INTRAVENOUS

## 2018-04-03 MED ORDER — BUSPIRONE HCL 15 MG PO TABS: 15.0000 mg | ORAL_TABLET | Freq: Three times a day (TID) | ORAL | 0 refills | Status: DC

## 2018-04-03 MED ORDER — ALUM & MAG HYDROXIDE-SIMETH 200-200-20 MG/5ML PO SUSP: 30.0000 mL | Freq: Four times a day (QID) | ORAL | Status: DC | PRN

## 2018-04-03 MED ORDER — BUSPIRONE HCL 10 MG PO TABS
15.0000 mg | ORAL_TABLET | Freq: Three times a day (TID) | ORAL | Status: DC
  Administered 2018-04-03 – 2018-04-09 (×17): 15 mg via ORAL
  Filled 2018-04-03 (×19): qty 1.5

## 2018-04-03 MED ORDER — ONDANSETRON HCL 4 MG PO TABS: 4.0000 mg | ORAL_TABLET | Freq: Three times a day (TID) | ORAL | Status: DC | PRN

## 2018-04-03 MED ORDER — CLOPIDOGREL BISULFATE 75 MG PO TABS
75.0000 mg | ORAL_TABLET | Freq: Every morning | ORAL | Status: DC
  Administered 2018-04-04 – 2018-04-09 (×6): 75 mg via ORAL
  Filled 2018-04-03 (×6): qty 1

## 2018-04-03 MED ORDER — ENOXAPARIN SODIUM 30 MG/0.3ML ~~LOC~~ SOLN
30.0000 mg | SUBCUTANEOUS | Status: DC
  Administered 2018-04-03 – 2018-04-07 (×5): 30 mg via SUBCUTANEOUS
  Filled 2018-04-03 (×5): qty 0.3

## 2018-04-03 MED ORDER — ASPIRIN EC 81 MG PO TBEC
81.0000 mg | DELAYED_RELEASE_TABLET | Freq: Every day | ORAL | Status: DC
  Administered 2018-04-04 – 2018-04-09 (×7): 81 mg via ORAL
  Filled 2018-04-03 (×7): qty 1

## 2018-04-03 MED ORDER — CARBOXYMETHYLCELLULOSE SODIUM 0.5 % OP SOLN: 1.0000 [drp] | Freq: Every day | OPHTHALMIC | Status: DC | PRN

## 2018-04-03 MED ORDER — ACETAMINOPHEN 325 MG PO TABS
650.0000 mg | ORAL_TABLET | Freq: Four times a day (QID) | ORAL | Status: DC | PRN
  Administered 2018-04-04 – 2018-04-09 (×9): 650 mg via ORAL
  Filled 2018-04-03 (×9): qty 2

## 2018-04-03 MED ORDER — MIRTAZAPINE 15 MG PO TABS
15.0000 mg | ORAL_TABLET | Freq: Every day | ORAL | Status: DC
  Administered 2018-04-03 – 2018-04-08 (×6): 15 mg via ORAL
  Filled 2018-04-03 (×6): qty 1

## 2018-04-03 MED ORDER — CITALOPRAM HYDROBROMIDE 10 MG PO TABS
20.0000 mg | ORAL_TABLET | Freq: Every day | ORAL | Status: DC
  Administered 2018-04-03 – 2018-04-09 (×7): 20 mg via ORAL
  Filled 2018-04-03 (×7): qty 2

## 2018-04-03 MED ORDER — BRIMONIDINE TARTRATE 0.15 % OP SOLN
1.0000 [drp] | Freq: Two times a day (BID) | OPHTHALMIC | Status: DC
  Administered 2018-04-05 – 2018-04-09 (×9): 1 [drp] via OPHTHALMIC
  Filled 2018-04-03: qty 5

## 2018-04-03 MED ORDER — ACETAMINOPHEN 650 MG RE SUPP: 650.0000 mg | Freq: Four times a day (QID) | RECTAL | Status: DC | PRN

## 2018-04-03 MED ORDER — SODIUM CHLORIDE 0.9 % IV SOLN
INTRAVENOUS | Status: AC
  Administered 2018-04-03 – 2018-04-04 (×3): via INTRAVENOUS

## 2018-04-03 MED ORDER — HYPROMELLOSE (GONIOSCOPIC) 2.5 % OP SOLN
1.0000 [drp] | Freq: Every day | OPHTHALMIC | Status: DC | PRN
  Filled 2018-04-03: qty 15

## 2018-04-03 MED ORDER — ACETAMINOPHEN 325 MG PO TABS: 650.0000 mg | ORAL_TABLET | ORAL | Status: DC | PRN

## 2018-04-03 MED ORDER — ZOLPIDEM TARTRATE 5 MG PO TABS: 5.0000 mg | ORAL_TABLET | Freq: Every evening | ORAL | Status: DC | PRN

## 2018-04-03 NOTE — ED Notes (Signed)
Pt has purewick in place and is hooked up to suction.
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
## 2018-04-03 NOTE — Assessment & Plan Note (Signed)
Patient has concerns and stressors living with daughter.  open APC case as of 04/03/18.  Also has supportive services in place with [facility]  ( RN, Pharmacy and Social work)  Casimer Lanius, LCSW Licensed Clinical Social Worker [place]   [phone] 2:22 PM

## 2018-04-03 NOTE — ED Notes (Signed)
Attempted report x1. 

## 2018-04-03 NOTE — Consult Note (Signed)
  Tele psych Assessment   [name], 77 y.o., female patient seen via tele psych by TTS and this provider; chart reviewed and consulted with Dr. Dwyane Dee on 04/03/18.  On evaluation [name] reports Came to the hospital because she was feeling dizzy and thought it may be her blood pressure.  Patient denies suicidal/self-harm/homicidal ideation, psychosis, and paranoia.  Patient daughter at bedside and also states that she feels that her mother is safe.  Patient currently has outpatient psychiatric services.  During evaluation [name] is alert/oriented x 4; calm/cooperative; and mood is congruent with affect.  She does not appear to be responding to internal/external stimuli or delusional thoughts; and denies suicidal/self-harm/homicidal ideation, psychosis, and paranoia.  Patient answered question appropriately.  For detailed note see TTS tele assessment note  Recommendations:  Follow up with current outpatient psychiatric provider  Disposition:  Patient is psychiatrically cleared No evidence of imminent risk to self or others at present.   Patient does not meet criteria for psychiatric inpatient admission. Supportive therapy provided about ongoing stressors. Discussed crisis plan, support from social network, calling 911, coming to the Emergency Department, and calling Suicide Hotline.   Spoke with Dr. Eulis Foster; informed of above recommendation and disposition  Earleen Newport, NP
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
## 2018-04-03 NOTE — ED Notes (Signed)
Patient transported to X-ray 
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
## 2018-04-03 NOTE — H&P (Addendum)
[facility] Admission History and Physical Service Pager: [phone]  Patient name: [name] [name] [name] number: [id number] Date of birth: Dec 31, 1940 Age: 77 y.o. Gender: female  Primary Care Provider: [name], MD Consultants: Psych Code Status: Full  Chief Complaint: SI/HI  Assessment and Plan: [name] is a 77 y.o. female presenting with SI/HI after falling in office and found to have AKI. PMH is significant for HTN, HLD, GERD, back pain, OSA, PVD, MDD, psychosocial stressors, polypharmacy.  AKI on CKD stage IIIa: Creatinine 2.2 with baseline around 1.  GFR down to 20 from baseline of 49 from 5/31.  Likely prerenal given decreased intake although patient reports that she drinks at least 5 bottles of water per day.  Patient does also endorse using multiple Goody's powders, but reports that she has decreased her intake after her PCP told her to decrease those.  Unclear how many she takes daily.  Patient does have recent fall where she reports she tripped over a dog but was quickly able to get off the floor.  Unlikely rhabdomyolysis given story of fall. -Admit for observation, attending Dr. Gwendlyn Deutscher -Give 500 cc NS bolus, then MIVF overnight -Monitor vitals per routine -Encourage p.o. -Trend BMP / urinary output -Replace electrolytes as indicated -Avoid nephrotoxic agents, ensure adequate renal perfusion  MDD  psychosocial stressors with SI/HI: Patient reporting suicidal and homicidal thoughts prior to being brought into the ED.  On arrival patient was evaluated by psych who determined that patient was no longer actively suicidal or homicidal.  Patient follows closely with outpatient psychiatry and management outpatient was with psychiatry recommended in ED.  Patient takes BuSpar, Celexa, and Remeron daily.  Patient reports compliance with these medications.  However patient reports that she has very stressful life situation where she lives with her  daughter who does methamphetamine.  Patient reports that today she just wanted to end it all.  She says she no longer feels this way, that this feeling will come and go. -Psych consulted, appreciate recommendations -Continue current medications -Suicide precautions if patient starts voicing more SI/HI -SW consult for complicated situation at home  Recent fall and weakness: Patient reports fall yesterday after she tripped over her daughter's dog.  Earlier today noted to be weak at family medicine clinic.  Likely due to deconditioning. -Orthostatic vitals -PT/OT eval and treat -A.m. EKG  HTN: Patient hypotensive to 91/58 in ED.  Patient on amlodipine 5 mg daily and metoprolol 12.5 twice daily. -Holding antihypertensives in setting of hypotension -Restart as able -Monitor blood pressure closely  HLD: Patient with hyperlipidemia per chart review.  However does not appear to be on statin therapy due to swelling of tongue and muscle pain. -May consider other treatments of hyperlipidemia in setting of PVD  GERD: Patient on Protonix 20 mg daily.   -Will hold in setting of AKI on CKD stage III  back pain: Patient with history of back pain for which she uses Goody's powder and tramadol as needed. -Holding tramadol until patient requests for back pain. -Tylenol as needed for pain -Encouraged patient to stop using Goody's powder  OSA: Unclear if patient wears CPAP nightly. -CPAP nightly  PVD: Patient with previous history of stents with most recent stent in 2017 and iliac artery.  Patient on DAPT since procedure.  Consulted pharm on whether or not to continue DAPT and as there are opposing studies will continue with current DAPT. -Continue Plavix and aspirin daily.  FEN/GI: Heart healthy diet  Prophylaxis: Renally dosed Lovenox  Disposition: Place in observation  History of Present Illness:  [name] [name] is a 77 y.o. female presenting with SI/HI and fall in office.  History difficult to  obtain from patient as she goes off on many tangents.  Patient reports that she is here today because she was having on and off thoughts of harming herself.  She was seen earlier in behavioral health clinic at family medicine center.  While at the clinic patient fell from her chair.  Vitals at that time were stable and they sent her to the ED to be further evaluated.    On arrival to ED patient stating that she was having homicidal ideation for her daughter.  She reports that her daughter recently allowed her to move in and she was not aware that she was a methamphetamine user.  She is often frustrated with her.  Patient reports that she is no longer having SI/HI that this just comes and goes.  Patient reports that her knee hurts as she fell over her daughter's dog yesterday.  Reports that she tripped over the great dane and hit her knee and then was quickly able to get back up.   Patient reports that she has been drinking at least 5 bottles of water per day.  She denies any change in medication or change in diet.  Patient denies any dysuria or increased frequency.  Patient reports that Dr. Kris Mouton told her to take less of her Goody's powder.  She reports that she still takes this daily but has been taking less.  Also reports that Dr. Kris Mouton told her to drink a gallon of water a day she is having trouble doing this.  Review Of Systems: Per HPI with the following additions:   Review of Systems  Constitutional: Positive for chills. Negative for fever.  Eyes: Positive for blurred vision.  Respiratory: Negative for shortness of breath and wheezing.   Cardiovascular: Negative for chest pain and leg swelling.  Gastrointestinal: Positive for abdominal pain, diarrhea and nausea. Negative for constipation and vomiting.  Genitourinary: Negative for dysuria.  Musculoskeletal: Positive for back pain, falls and joint pain.  Neurological: Positive for dizziness and headaches.  Psychiatric/Behavioral: Positive  for depression. Negative for substance abuse and suicidal ideas.    Patient Active Problem List   Diagnosis Date Noted  . Psychosocial stressors 04/03/2018  . AKI (acute kidney injury) ([place]) 04/03/2018  . Knee pain, left 01/02/2018  . Bleeding from the nose 12/04/2017  . Healthcare maintenance 11/11/2017  . Fall 11/11/2017  . Polypharmacy 11/11/2017  . Atypical pneumonia 11/11/2017  . Acute renal failure ([place]) 01/25/2017  . Major depressive disorder, recurrent severe without psychotic features ([place]) 09/21/2016  . Peripheral vascular disease ([place])   . Claudication ([place]) 01/31/2016  . Fatigue 10/18/2015  . Right knee pain 10/18/2015  . Acrochordon 08/03/2015  . Stasis dermatitis of both legs 01/18/2015  . Radiculopathy of lumbar region 01/06/2015  . Seborrheic keratosis 11/24/2014  . OSA (obstructive sleep apnea) 09/21/2014  . Osteoporosis 06/29/2014  . Thyroid nodule 06/29/2014  . Breast calcifications 06/29/2014  . Glaucoma 01/08/2014  . Basal cell carcinoma 11/12/2013  . Presbyopia 10/22/2013  . Resting tremor 10/14/2013  . Lumbar back pain with radiculopathy affecting right lower extremity 09/11/2013  . Polymyalgia rheumatica ([place]) 09/11/2013  . GERD (gastroesophageal reflux disease) 09/11/2013  . Pulmonary nodules 05/24/2012  . Carotid bruit 04/30/2012  . Hypertension 04/27/2012  . Anxiety state 04/27/2012  . Hyperlipemia 04/27/2012  .  Cervical spondylosis with radiculopathy 04/19/2012    Past Medical History: Past Medical History:  Diagnosis Date  . Anxiety   . Arthritis    RA  . Carpal tunnel syndrome, bilateral   . Chronic kidney disease    CKD stage III, absence of left kidney  . Congenital absence of one kidney    Pt has right kidney only  . Depression   . GERD (gastroesophageal reflux disease)   . Headache   . Heart murmur   . Hypercholesteremia   . Hypertension   . Peripheral vascular disease ([place])    a. s/p R external iliac stent '06 with  angioplasty '13 (Dr. Benjamine Sprague) b. 01/2016: s/p PTA/stenting in L common iliac artery (Dr. Gwenlyn Found)  . Polymyalgia rheumatica ([place])   . Psoriasis     Past Surgical History: Past Surgical History:  Procedure Laterality Date  . ABDOMINAL HYSTERECTOMY  1963   Partial,  Due to bleeding after delivery  . ANTERIOR CERVICAL DECOMP/DISCECTOMY FUSION  04/19/2012   Procedure: ANTERIOR CERVICAL DECOMPRESSION/DISCECTOMY FUSION 2 LEVELS;  Surgeon: Winfield Cunas, MD;  Location: [facility];  Service: Neurosurgery;  Laterality: N/A;  Cervical four-five,Cervical five-six anterior cervical decompression with fusion plating and bonegraft possible posterior cervical decompression  . APPENDECTOMY    . CARPAL TUNNEL RELEASE  1990  . CERVICAL FUSION  04/19/2012  . EYE SURGERY    . ILIAC ARTERY STENT    . PERIPHERAL VASCULAR CATHETERIZATION N/A 01/31/2016   Procedure: Abdominal Aortogram;  Surgeon: [name], MD;  Location: [place] [facility] [facility];  Service: Cardiovascular;  Laterality: N/A;  . PERIPHERAL VASCULAR CATHETERIZATION Bilateral 01/31/2016   Procedure: Lower Extremity Angiography;  Surgeon: [name], MD;  Location: [place] [facility];  Service: Cardiovascular;  Laterality: Bilateral;  . PERIPHERAL VASCULAR CATHETERIZATION Left 01/31/2016   Procedure: Peripheral Vascular Intervention;  Surgeon: [name], MD;  Location: [place] [facility];  Service: Cardiovascular;  Laterality: Left;  ILIAC  . TONSILLECTOMY      Social History: Social History   Tobacco Use  . Smoking status: Former Smoker    Packs/day: 1.00    Years: 40.00    Pack years: 40.00    Types: Cigarettes    Last attempt to quit: 07/24/2010    Years since quitting: 7.6  . Smokeless tobacco: Never Used  . Tobacco comment: STARTED BACK JULY 2013 AND RECENTLY QUIT 03-10-2012  Substance Use Topics  . Alcohol use: No    Alcohol/week: 0.0 standard drinks    Comment: QUIT IN 1999  . Drug use: No   Additional social history:  Reports drinking occasionally which is less than 1 time per month.  Former smoker.  Denies any illicit drug use. Please also refer to relevant sections of EMR.  Family History: Family History  Problem Relation Age of Onset  . Angina Mother   . Heart attack Father   . Melanoma Brother   . Cancer Sister        Lymphoma    Allergies and Medications: Allergies  Allergen Reactions  . Gabapentin Itching    Was deleted since patient was taking, but now experiencing itching again.   . Statins Swelling, Rash and Other (See Comments)    Swelling involving tongue; also causes muscle pain   No current facility-administered medications on file prior to encounter.    Current Outpatient Medications on File Prior to Encounter  Medication Sig Dispense Refill  . acetaminophen (TYLENOL) 500 MG tablet Take 1,000  mg by mouth every 6 (six) hours as needed for moderate pain or headache.    [name] amLODipine (NORVASC) 5 MG tablet TAKE 1 TABLET(5 MG) BY MOUTH DAILY (Patient taking differently: Take 5 mg by mouth daily. ) 90 tablet 0  . aspirin EC 81 MG EC tablet Take 1 tablet (81 mg total) by mouth daily.    . Aspirin-Salicylamide-Caffeine (BC HEADACHE POWDER PO) Take 1 packet by mouth as needed (back pain).     . brimonidine (ALPHAGAN) 0.15 % ophthalmic solution Place 1 drop into both eyes 2 (two) times daily.    . busPIRone (BUSPAR) 15 MG tablet Take 1 tablet (15 mg total) by mouth 3 (three) times daily. 90 tablet 0  . carboxymethylcellulose (REFRESH PLUS) 0.5 % SOLN 1 drop daily as needed.    . cholecalciferol (VITAMIN D) 1000 units tablet Take 1,000 Units by mouth daily.    . citalopram (CELEXA) 20 MG tablet Take 1 tablet (20 mg total) by mouth daily. 90 tablet 0  . clopidogrel (PLAVIX) 75 MG tablet Take 1 tablet (75 mg total) by mouth every morning. 90 tablet 3  . Cyanocobalamin (VITAMIN B 12 PO) Take 1 tablet by mouth daily.    . diclofenac sodium (VOLTAREN) 1 % GEL Apply 4 g topically 4 (four) times  daily. (Patient taking differently: Apply 4 g topically as needed. ) 100 g 0  . docusate sodium (COLACE) 100 MG capsule Take 100 mg by mouth 2 (two) times daily.     . Dorzolamide HCl-Timolol Mal (COSOPT OP) Apply 1 drop to eye 2 (two) times daily.    . Evolocumab (REPATHA SURECLICK) 245 MG/ML SOAJ Inject 140 mg into the skin every 14 (fourteen) days. 2 pen 12  . Latanoprostene Bunod (VYZULTA) 0.024 % SOLN Place 1 drop into both eyes at bedtime.    . metoprolol tartrate (LOPRESSOR) 25 MG tablet Take 0.5 tablets (12.5 mg total) by mouth 2 (two) times daily. 30 tablet 11  . mirtazapine (REMERON) 15 MG tablet Take 15 mg by mouth at bedtime.    . Multiple Vitamin (MULTIVITAMIN WITH MINERALS) TABS tablet Take 1 tablet by mouth daily.    [name] nystatin cream (MYCOSTATIN) Apply to affected area twice dailiy  2  . Omega-3 Fatty Acids (FISH OIL PO) Take 1 tablet by mouth at bedtime.    . pantoprazole (PROTONIX) 20 MG tablet TAKE 1 TABLET(20 MG) BY MOUTH DAILY (Patient taking differently: Take 20 mg by mouth daily. ) 90 tablet 0  . polyethylene glycol (MIRALAX / GLYCOLAX) packet Take 17 g by mouth daily as needed.     . traMADol (ULTRAM) 50 MG tablet TAKE 2 TABLETS(100 MG) BY MOUTH TWICE DAILY AS NEEDED FOR MODERATE PAIN (Patient taking differently: Take 100 mg by mouth 2 (two) times daily as needed for moderate pain. ) 60 tablet 0    Objective: BP 119/73 (BP Location: Left Arm)   Pulse 81   Temp 98.1 F (36.7 C) (Oral)   Resp (!) 22   SpO2 95%  Exam: General: NAD, pleasant Eyes: PERRL, EOMI, no conjunctival pallor or injection ENTM: Moist mucous membranes, no pharyngeal erythema or exudate Neck: Supple, no LAD Cardiovascular: RRR, no m/r/g, no LE edema Respiratory: CTA BL, normal work of breathing Gastrointestinal: soft, nontender, nondistended MSK: moves 4 extremities equally, small area of ecchymosis noted over left knee Derm: no rashes appreciated Neuro: CN II-XII grossly intact Psych: AOx3,  appropriate affect, tangential speech   Labs and Imaging: CBC BMET  Recent Labs  Lab 04/03/18 1204  WBC 5.3  HGB 10.4*  HCT 32.2*  PLT 183   Recent Labs  Lab 04/03/18 1204  NA 142  K 4.0  CL 106  CO2 22  BUN 33*  CREATININE 2.21*  GLUCOSE 88  CALCIUM 11.7*      Dg Chest 2 View  Result Date: 04/03/2018 CLINICAL DATA:  Confusion.  Fall. EXAM: CHEST - 2 VIEW COMPARISON:  03/15/2018 FINDINGS: Minimal bibasilar atelectasis. Heart is normal size. No effusions or acute bony abnormality. No pneumothorax. IMPRESSION: Minimal bibasilar atelectasis. Electronically Signed   By: Rolm Baptise M.D.   On: 04/03/2018 18:06   Ct Head Wo Contrast  Result Date: 04/03/2018 CLINICAL DATA:  Fall.  Hit back of head.  On Plavix. EXAM: CT HEAD WITHOUT CONTRAST TECHNIQUE: Contiguous axial images were obtained from the base of the skull through the vertex without intravenous contrast. COMPARISON:  03/15/2018 FINDINGS: Brain: No evidence of acute infarction, hemorrhage, hydrocephalus, extra-axial collection or mass lesion/mass effect. Vascular: No hyperdense vessel or unexpected calcification. Skull: Normal. Negative for fracture or focal lesion. Sinuses/Orbits: No acute finding. Other: None IMPRESSION: 1. No acute intracranial abnormalities.  Normal brain for age. Electronically Signed   By: Kerby Moors M.D.   On: 04/03/2018 13:31   Dg Knee Complete 4 Views Left  Result Date: 04/03/2018 CLINICAL DATA:  Knee pain after fall EXAM: LEFT KNEE - COMPLETE 4+ VIEW COMPARISON:  None. FINDINGS: There is extensive chondrocalcinosis of the left femorotibial joint space. No acute fracture or dislocation. No knee effusion. There is mild joint space narrowing with small osteophytes. The patellofemoral joint space is normal. IMPRESSION: 1. No acute fracture or dislocation of the left knee. 2. Moderate left femoral tibial joint space chondrocalcinosis, as may be seen in the setting of calcium pyrophosphate deposition  disease. Electronically Signed   By: Ulyses Jarred M.D.   On: 04/03/2018 18:14     Zorianna Taliaferro, [place], DO 04/03/2018, 9:21 PM PGY-2, [place] Intern pager: [phone], text pages welcome
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
## 2018-04-03 NOTE — ED Notes (Signed)
Blue top sent to lab. 

## 2018-04-03 NOTE — ED Notes (Signed)
Pt's daughter called to check on pt. Asked to be called when dispo is set.

## 2018-04-03 NOTE — Progress Notes (Signed)
Patient is on [facility] schedule this morning, which she was being taken into the [facility]' office she started feeling weak. She was immediately put on a wheelchair.  Patient stated that she feels confused and weak. No facial asymmetry or limb weakness. She has been tremulous for few weeks now and she fell at home yesterday. No chest pain or SOB.  HR: 95 and O2 Sat on RA 98% BP 138/70. CBG 97  I recommended ED transfer for further evaluation. I doubt she is having a stroke. She has hx of panic attack and she could be having a flare.   Contact [facility] for ED transfer.  Diagnosis: Weakness (Deconditioning).                   Confusion                   Tremors

## 2018-04-03 NOTE — ED Provider Notes (Signed)
Medical screening examination/treatment/procedure(s) were conducted as a shared visit with non-physician practitioner(s) and myself.  I personally evaluated the patient during the encounter.  At [facility] for anxiety/depression, worsening suicidal thoughts. Started to act confused/panicky and was sent here. On my exam, is tearful, flat and depressed but no abnormal thought content aside from worsening suicidal thoughts (no ideation or plan).  Workup with AKI. wll need fluids.  Would plan for continued medical workup and if clear and at baseline psych consult.   EKG Interpretation  Date/Time:  Wednesday April 03 2018 12:16:42 EDT Ventricular Rate:  72 PR Interval:    QRS Duration: 85 QT Interval:  374 QTC Calculation: 410 R Axis:   82 Text Interpretation:  Sinus rhythm Borderline right axis deviation Abnormal R-wave progression, early transition No significant change since last tracing Confirmed by Merrily Pew [phone]) on 04/03/2018 12:31:19 PM     Gaberiel Youngblood, Corene Cornea, MD 04/04/18 1433

## 2018-04-03 NOTE — Telephone Encounter (Signed)
2nd phone call received. Pharmacy is [facility] in [place].  Danley Danker, RN [facility] RN)

## 2018-04-03 NOTE — BH Specialist Note (Signed)
Integrated Behavioral Health Follow Up Visit  MRN: [id number] Name: [name] [name]  Number of [place] Clinician visits: 3/6 Session Start time: 10:30  Session End time: 11:15 Total time: 45 minutes  Type of Service: [place] Interpretor:No. Interpretor Name and Language: NA  Patient weak, shaky, short of breath, holding on to the fall and difficulty walking . LCSW call MD & RN to evaluate patient.  (Disposition to ED per Dr. Gwendlyn Deutscher) Patient request call to daughter to provide update.  Message left for daughter.  SUBJECTIVE: [name] [name] is a 77 y.o. female .Returning for F/U visit for assistance with managing stressors.  . Patient reports the following symptoms/concerns: recent fall (04/03/18), stress from living with daughter. Patient has active APS case in [place].  Home visit completed by APS 04/02/18. Also concerns of patient not getting medication.  She now has [facility] supportive services. OBJECTIVE: Mood: Anxious and Affect: Tearful Risk of harm to self or others: No plan to harm self or others LIFE CONTEXT: Family and Social: lives with daughter [name] in [place]: recent APS case,  GOALS ADDRESSED: Patient will: 1.  Reduce symptoms of: anxiety and stress  2.  Increase knowledge and/or ability of: coping skills and stress reduction  3.  Demonstrate ability to: Increase adequate support systems for patient/family INTERVENTIONS: Interventions utilized:  Supportive Counseling. Consult with MD, Called APS spoke to [name] for update on next steps for patient. Standardized Assessments completed: Not Needed ASSESSMENT: Patient currently experiencing medical concerns, going to ED for evaluation.  Patient drove self from [place] to [facility] appointment today, stated her daughter would not bring her.  Patient states not wanting to return to her daughter's home, tearful during conversation. Assessed  for safety and concerns. Patient was also concerned about getting her medication.  States [facility] will now have her medication  . Patient being followed by [facility]  LCSW and APS social worker.  Patient will benefit from changing her home situation.  She expressed that she is interested in assisted living.  APS social worker will discuss this with her during his home visit. Concerns with medication have been resolved with [place]. [place] called office and left messaged ref. patient getting refill rx for Buspar.  No additional [facility] services needed at this time. Left message with [facility] LCSW for care coordination.  PLAN:  1. APS worker Shanon Brow will F/U with patient.   2. [facility] social worker will F/U Sept. 30th  3. [facility] pharmacy will F/U Sept 26th   Casimer Lanius, LCSW Licensed Clinical Social Worker [place]   [phone] 1:53 PM

## 2018-04-03 NOTE — ED Notes (Signed)
Pt CBG 86 

## 2018-04-04 ENCOUNTER — Other Ambulatory Visit: Payer: Self-pay

## 2018-04-04 ENCOUNTER — Other Ambulatory Visit: Payer: Self-pay | Admitting: Pharmacist

## 2018-04-04 DIAGNOSIS — R45851 Suicidal ideations: Secondary | ICD-10-CM | POA: Diagnosis not present

## 2018-04-04 DIAGNOSIS — F329 Major depressive disorder, single episode, unspecified: Secondary | ICD-10-CM | POA: Diagnosis not present

## 2018-04-04 DIAGNOSIS — K219 Gastro-esophageal reflux disease without esophagitis: Secondary | ICD-10-CM | POA: Diagnosis not present

## 2018-04-04 DIAGNOSIS — N183 Chronic kidney disease, stage 3 (moderate): Secondary | ICD-10-CM | POA: Diagnosis not present

## 2018-04-04 DIAGNOSIS — E785 Hyperlipidemia, unspecified: Secondary | ICD-10-CM | POA: Diagnosis not present

## 2018-04-04 DIAGNOSIS — I129 Hypertensive chronic kidney disease with stage 1 through stage 4 chronic kidney disease, or unspecified chronic kidney disease: Secondary | ICD-10-CM | POA: Diagnosis not present

## 2018-04-04 DIAGNOSIS — R45850 Homicidal ideations: Secondary | ICD-10-CM

## 2018-04-04 DIAGNOSIS — R410 Disorientation, unspecified: Secondary | ICD-10-CM

## 2018-04-04 DIAGNOSIS — R531 Weakness: Secondary | ICD-10-CM | POA: Diagnosis not present

## 2018-04-04 DIAGNOSIS — G8929 Other chronic pain: Secondary | ICD-10-CM | POA: Diagnosis not present

## 2018-04-04 DIAGNOSIS — I739 Peripheral vascular disease, unspecified: Secondary | ICD-10-CM | POA: Diagnosis not present

## 2018-04-04 DIAGNOSIS — G4733 Obstructive sleep apnea (adult) (pediatric): Secondary | ICD-10-CM | POA: Diagnosis not present

## 2018-04-04 DIAGNOSIS — N179 Acute kidney failure, unspecified: Secondary | ICD-10-CM | POA: Diagnosis not present

## 2018-04-04 DIAGNOSIS — I959 Hypotension, unspecified: Secondary | ICD-10-CM | POA: Diagnosis not present

## 2018-04-04 LAB — BASIC METABOLIC PANEL
Anion gap: 10 (ref 5–15)
BUN: 32 mg/dL — ABNORMAL HIGH (ref 8–23)
CO2: 20 mmol/L — ABNORMAL LOW (ref 22–32)
Calcium: 9.2 mg/dL (ref 8.9–10.3)
Chloride: 112 mmol/L — ABNORMAL HIGH (ref 98–111)
Creatinine, Ser: 1.96 mg/dL — ABNORMAL HIGH (ref 0.44–1.00)
GFR calc Af Amer: 27 mL/min — ABNORMAL LOW (ref >60)
GFR calc non Af Amer: 23 mL/min — ABNORMAL LOW (ref >60)
Glucose, Bld: 83 mg/dL (ref 70–99)
Potassium: 3.6 mmol/L (ref 3.5–5.1)
Sodium: 142 mmol/L (ref 135–145)

## 2018-04-04 LAB — CBC
HCT: 28.9 % — ABNORMAL LOW (ref 36.0–46.0)
Hemoglobin: 9.4 g/dL — ABNORMAL LOW (ref 12.0–15.0)
MCH: 33.3 pg (ref 26.0–34.0)
MCHC: 32.5 g/dL (ref 30.0–36.0)
MCV: 102.5 fL — ABNORMAL HIGH (ref 78.0–100.0)
Platelets: 144 10*3/uL — ABNORMAL LOW (ref 150–400)
RBC: 2.82 MIL/uL — ABNORMAL LOW (ref 3.87–5.11)
RDW: 14.8 % (ref 11.5–15.5)
WBC: 3.3 10*3/uL — ABNORMAL LOW (ref 4.0–10.5)

## 2018-04-04 NOTE — Patient Outreach (Signed)
[facility]) Care Management  04/04/2018  [name] [name] 1941/01/06 [id number]   Patient was called as it was unclear if she was actually admitted or just under "observation".  Patient answered the phone. HIPAA identifiers were obtained.  Patient confirmed she was admitted and was unclear when she would be discharged.  Patient was admitted for altered mental status and Acute Kidney Injury.  Patient also reported tripping over her daughter's dog yesterday.  Patient had been out of buspirone for more than 1 month.  Dr. Dorise Bullion office sent the refill to [facility] and they will hold the prescription until th patient gets out of the hospital.  Plan: Call patient once discharged.   Elayne Guerin, PharmD, [place] Clinical Pharmacist [phone]

## 2018-04-04 NOTE — [place] FL2 (Signed)
[place] MEDICAID FL2 LEVEL OF CARE SCREENING TOOL     IDENTIFICATION  Patient Name: [name] [name] Birthdate: 10-Jul-1941 Sex: female Admission Date (Current Location): 04/03/2018  [facility] and [place] Number:  Herbalist and Address:  [facility] [place], [place] [address], [place], [place] [address]      Provider Number: [id number]  Attending Physician Name and Address:  Kinnie Feil, MD  Relative Name and Phone Number:  [name], [phone]    Current Level of Care: Hospital Recommended Level of Care: [place] Prior Approval Number:    Date Approved/Denied:   PASRR Number: pending  Discharge Plan: SNF    Current Diagnoses: Patient Active Problem List   Diagnosis Date Noted  . Confusion   . Homicidal ideation   . Suicidal ideation   . Psychosocial stressors 04/03/2018  . AKI (acute kidney injury) ([place]) 04/03/2018  . Knee pain, left 01/02/2018  . Bleeding from the nose 12/04/2017  . Healthcare maintenance 11/11/2017  . Fall 11/11/2017  . Polypharmacy 11/11/2017  . Atypical pneumonia 11/11/2017  . Acute renal failure ([place]) 01/25/2017  . Major depressive disorder, recurrent severe without psychotic features ([place]) 09/21/2016  . Peripheral vascular disease ([place])   . Claudication ([place]) 01/31/2016  . Weakness 10/18/2015  . Right knee pain 10/18/2015  . Acrochordon 08/03/2015  . Stasis dermatitis of both legs 01/18/2015  . Radiculopathy of lumbar region 01/06/2015  . Seborrheic keratosis 11/24/2014  . OSA (obstructive sleep apnea) 09/21/2014  . Osteoporosis 06/29/2014  . Thyroid nodule 06/29/2014  . Breast calcifications 06/29/2014  . Glaucoma 01/08/2014  . Basal cell carcinoma 11/12/2013  . Presbyopia 10/22/2013  . Resting tremor 10/14/2013  . Lumbar back pain with radiculopathy affecting right lower extremity 09/11/2013  . Polymyalgia rheumatica ([place]) 09/11/2013  . GERD (gastroesophageal reflux disease)  09/11/2013  . Pulmonary nodules 05/24/2012  . Carotid bruit 04/30/2012  . Hypertension 04/27/2012  . Anxiety state 04/27/2012  . Hyperlipemia 04/27/2012  . Cervical spondylosis with radiculopathy 04/19/2012    Orientation RESPIRATION BLADDER Height & Weight     Self, Time, Situation, Place  Normal Continent Weight: 142 lb 3.1 oz (64.5 kg) Height:  5' (152.4 cm)  BEHAVIORAL SYMPTOMS/MOOD NEUROLOGICAL BOWEL NUTRITION STATUS      Continent Diet(see discharge summary)  AMBULATORY STATUS COMMUNICATION OF NEEDS Skin   Limited Assist Verbally Normal                       Personal Care Assistance Level of Assistance  Bathing, Feeding, Dressing Bathing Assistance: Limited assistance Feeding assistance: Independent Dressing Assistance: Limited assistance     Functional Limitations Info  Sight, Hearing, Speech Sight Info: Adequate Hearing Info: Adequate Speech Info: Adequate    SPECIAL CARE FACTORS FREQUENCY  PT (By licensed PT), OT (By licensed OT)     PT Frequency: 5x week OT Frequency: 5x week            Contractures Contractures Info: Not present    Additional Factors Info  Code Status, Allergies, Psychotropic Code Status Info: Full Code Allergies Info: GABAPENTIN, STATINS  Psychotropic Info: citalopram (CELEXA) tablet 20 mg daily PO; mirtazapine (REMERON) tablet 15 mg daily at bedtime PO         Current Medications (04/04/2018):  This is the current hospital active medication list Current Facility-Administered Medications  Medication Dose Route Frequency Provider Last Rate Last Dose  . acetaminophen (TYLENOL) tablet 650 mg  650 mg Oral  Q6H PRN [name], [place], DO   650 mg at 04/04/18 1457   Or  . acetaminophen (TYLENOL) suppository 650 mg  650 mg Rectal Q6H PRN [name], [place], DO      . alum & mag hydroxide-simeth (MAALOX/MYLANTA) 200-200-20 MG/5ML suspension 30 mL  30 mL Oral Q6H PRN [name], [place], DO      . aspirin EC tablet 81 mg  81 mg Oral Daily  [name], [place], DO   81 mg at 04/04/18 1140  . brimonidine (ALPHAGAN) 0.15 % ophthalmic solution 1 drop  1 drop Both Eyes BID [place] Derry, [place], DO      . busPIRone (BUSPAR) tablet 15 mg  15 mg Oral TID [name], [place], DO   15 mg at 04/04/18 1141  . citalopram (CELEXA) tablet 20 mg  20 mg Oral Daily Enid Derry, [place], DO   20 mg at 04/04/18 1142  . clopidogrel (PLAVIX) tablet 75 mg  75 mg Oral q morning - 10a Enid Derry, [place], DO   75 mg at 04/04/18 1140  . enoxaparin (LOVENOX) injection 30 mg  30 mg Subcutaneous Q24H Enid Derry, [place], DO   30 mg at 04/03/18 2356  . hydroxypropyl methylcellulose / hypromellose (ISOPTO TEARS / GONIOVISC) 2.5 % ophthalmic solution 1 drop  1 drop Both Eyes Daily PRN [name], [place], DO      . mirtazapine (REMERON) tablet 15 mg  15 mg Oral QHS [name], [place], DO   15 mg at 04/03/18 2355     Discharge Medications: Please see discharge summary for a list of discharge medications.  Relevant Imaging Results:  Relevant Lab Results:   Additional Information [id number] [place], [place]

## 2018-04-04 NOTE — Progress Notes (Signed)
Patient has order for CPAP QHS. Patient states she has not wore a cpap at home in years. Says she has really bad panic attacks and does not want to wear one while in the hospital. RT will continue to monitor.

## 2018-04-04 NOTE — Social Work (Signed)
Spoke with PT, she is recommending SNF level therapies at discharge. CSW initiated authorization through [employer].  Pt preferences per PT are for [employer] or [facility].  [name], [place] Work [phone]

## 2018-04-04 NOTE — Progress Notes (Signed)
Family Medicine Teaching Service Daily Progress Note Intern Pager: [phone]  Patient name: [name] [name] record number: [id number] Date of birth: [birth year] Age: 77 y.o. Gender: female  Primary Care Provider: [name], MD Consultants: psychiatry Code Status: Full code  Pt Overview and Major Events to Date:  Hospital Day 0 Admitted: 04/03/2018  Assessment and Plan: [name] [name] is a 77 y.o. female presenting with SI/HI after falling in office and found to have AKI. PMH is significant for HTN, HLD, GERD, back pain, OSA, PVD, MDD, psychosocial stressors, polypharmacy.  #AKI on CKD-III - baseline Cr ~ 1.  2.2 on admission (BUN 33). 1.96 this am.  Takes goody's powder at home. 500cc bolus and mIVF NS given.  - encourage PO intake.  -recheck BMP in AM -avoid nephrotoxic agents  #MDD/psychological stressors with SI/HI - psych determined pt no longer suicidial/homicidal. Patient takes buspar, celexa, and remeron.  - psych consulted, appreciate recommendations. - continue current meds -suicide precautions if patient voices more SI/HI - SW consult.   #anemia/pancytopenia - wbc - 3.3, hgb 9.4, 144 platelets.  - anemia panel  # weakness/recent fall - noted to be weak at family medicine clinic. Likely due to deconditioning. Patient has leg tenderness to palpation at several locations bilaterally.  - orthosatic vitals -PT/OT eval and treat  #HTN - 91/58 in ED. BP has since improved. Most recent 120/56.  -holding home amlodipine 5 and metoprolol 12.5 BID -restart home meds when able.  - monitor BP  #HLD - does not take statin therapy due to tongue swelling and muscle pain.   # GERD - on protonix 20mg  daily.   - holding for AKI  #back pain - preexisting.  Takes goody's and tramadol prn - holding tramadol for now - tylenol PRN for pain  #OSA - uncertain pt compliance - CPAP nightly  #PVD - history of stents. Most recent in 2017. Patient on DAPT.  - continue plavix and  aspirin daily  Fluids: . sodium chloride 100 mL/hr at 04/04/18 0449   Electrolytes: replete PRN  Nutrition: heart healthy GI ppx: holding in aki DVT px: lovenox  Disposition: home  Medications: Scheduled Meds: . aspirin EC  81 mg Oral Daily  . brimonidine  1 drop Both Eyes BID  . busPIRone  15 mg Oral TID  . citalopram  20 mg Oral Daily  . clopidogrel  75 mg Oral q morning - 10a  . enoxaparin (LOVENOX) injection  30 mg Subcutaneous Q24H  . mirtazapine  15 mg Oral QHS   Continuous Infusions: . sodium chloride 100 mL/hr at 04/04/18 0449   PRN Meds: acetaminophen **OR** acetaminophen, alum & mag hydroxide-simeth, hydroxypropyl methylcellulose / hypromellose  ================================================= ================================================= Subjective:  Patient states she is not having suicidal or homicidal thoughts today: 'not thinking about it like I was'.  Patient states her skins is burning, but this has been a problem for her for 3 years.  She stated she had recently had incrasing panic attacks back to back.  She stated she had three last night that woke her up.  From February to July she was living with different grandchildren at various times bc she did not have anytwhere else to stay.  Now she stays with her daughter who she states has 'drug problems' which is causing the patient stress.    Patient complained of left hp and knee pain as well as back pain that was not new.    Objective: Temp:  [98 F (36.7 C)-98.6 F (  37 C)] 98 F (36.7 C) (09/26 0510) Pulse Rate:  [66-81] 66 (09/26 0510) Resp:  [14-22] 16 (09/26 0510) BP: (91-120)/(53-96) 120/56 (09/26 0510) SpO2:  [94 %-100 %] 96 % (09/26 0510) Weight:  [64.5 kg] 64.5 kg (09/26 0505) Intake/Output 09/25 0701 - 09/26 0700 In: 1283.6 [P.O.:340; I.V.:943.6] Out: -  Physical Exam:  Gen: NAD, alert, non-toxic,  sitting comfortably  HEENT: Normocephaic, atraumatic.  CV: Regular rate and rhythm.   Normal S1-S2.   Radial pulses 2+ bilaterally. No bilateral lower extremity edema. Resp: Clear to auscultation bilaterally.  No wheezing, rales, abnormal lung sounds.  No increased work of breathing appreciated. Abd: tender to palpation epigastrically.  Positive bowel sounds. Psych: Cooperative with exam. Makes eye contact. Patient appears anxious at times  Extremities: tender to palpation of left trachanteric region, lower calf pain bilaterally.    Laboratory: Recent Labs  Lab 04/03/18 1204  WBC 5.3  HGB 10.4*  HCT 32.2*  PLT 183   Recent Labs  Lab 04/03/18 1204  NA 142  K 4.0  CL 106  CO2 22  BUN 33*  CREATININE 2.21*  CALCIUM 11.7*  PROT 6.6  BILITOT 0.8  ALKPHOS 53  ALT 12  AST 25  GLUCOSE 88   Imaging/Diagnostic Tests: Dg Chest 2 View  Result Date: 04/03/2018 CLINICAL DATA:  Confusion.  Fall. EXAM: CHEST - 2 VIEW COMPARISON:  03/15/2018 FINDINGS: Minimal bibasilar atelectasis. Heart is normal size. No effusions or acute bony abnormality. No pneumothorax. IMPRESSION: Minimal bibasilar atelectasis. Electronically Signed   By: Rolm Baptise M.D.   On: 04/03/2018 18:06   Ct Head Wo Contrast  Result Date: 04/03/2018 CLINICAL DATA:  Fall.  Hit back of head.  On Plavix. EXAM: CT HEAD WITHOUT CONTRAST TECHNIQUE: Contiguous axial images were obtained from the base of the skull through the vertex without intravenous contrast. COMPARISON:  03/15/2018 FINDINGS: Brain: No evidence of acute infarction, hemorrhage, hydrocephalus, extra-axial collection or mass lesion/mass effect. Vascular: No hyperdense vessel or unexpected calcification. Skull: Normal. Negative for fracture or focal lesion. Sinuses/Orbits: No acute finding. Other: None IMPRESSION: 1. No acute intracranial abnormalities.  Normal brain for age. Electronically Signed   By: Kerby Moors M.D.   On: 04/03/2018 13:31   Dg Knee Complete 4 Views Left  Result Date: 04/03/2018 CLINICAL DATA:  Knee pain after fall EXAM: LEFT  KNEE - COMPLETE 4+ VIEW COMPARISON:  None. FINDINGS: There is extensive chondrocalcinosis of the left femorotibial joint space. No acute fracture or dislocation. No knee effusion. There is mild joint space narrowing with small osteophytes. The patellofemoral joint space is normal. IMPRESSION: 1. No acute fracture or dislocation of the left knee. 2. Moderate left femoral tibial joint space chondrocalcinosis, as may be seen in the setting of calcium pyrophosphate deposition disease. Electronically Signed   By: Ulyses Jarred M.D.   On: 04/03/2018 18:14      Benay Pike, MD 04/04/2018, 5:22 AM PGY-1, [place] Intern pager: [phone], text pages welcome

## 2018-04-04 NOTE — Evaluation (Addendum)
Occupational Therapy Evaluation Patient Details Name: [name] [name] MRN: [id number] DOB: 06/14/1941 Today's Date: 04/04/2018    History of Present Illness 77 y.o. female presenting with SI/HI; Pt with reports of recent fall at home; found to be weak at family medicine office on 9/25 and to have AKI. PMHx includes HTN, HLD, GERD, back pain, OSA, PVD, MDD, psychosocial stressors, polypharmacy. CT negative for acute changes. Imaging of L knee shows no acute fracture or dislocation   Clinical Impression   This 77 y/o female presents with the above. Pt reports at baseline she is mod independent with functional mobility using SPC/RW, reports she was recently requiring assist for ADL completion. Pt presenting with generalized weakness, decreased dynamic balance impacting her functional performance. Pt requiring minA for room level functional mobility using RW; she currently requires minA for standing grooming and toileting ADLs, modA for LB ADLs. Pt requiring cues for remaining on task during this session, increased time and occasional repetition for following commands. Pt lives with her daughter but unclear as to how much support/assist is available at home. Pt will benefit from continued OT services, recommend follow up [facility] services after discharge pending family is able to provide additional supervision/assist PRN. If family unable to provide adequate assist may need to consider ST SNF placement. Will continue to follow acutely to progress pt towards established OT goals.     Follow Up Recommendations  Home health OT;Supervision/Assistance - 24 hour(if 24hr not available, may need to consider SNF)    Equipment Recommendations  None recommended by OT(pt's DME needs are met)           Precautions / Restrictions Precautions Precautions: Fall Restrictions Weight Bearing Restrictions: No      Mobility Bed Mobility Overal bed mobility: Needs Assistance Bed Mobility: Sit to Supine       Sit to  supine: Min guard   General bed mobility comments: minguard for safety; increased time/effort   Transfers Overall transfer level: Needs assistance Equipment used: Rolling walker (2 wheeled) Transfers: Sit to/from Stand Sit to Stand: Min assist         General transfer comment: assist to rise and steady from toilet; VCs for safe hand placement prior to return to sitting    Balance Overall balance assessment: Needs assistance Sitting-balance support: Feet supported;No upper extremity supported Sitting balance-Leahy Scale: Good     Standing balance support: During functional activity;Bilateral upper extremity supported Standing balance-Leahy Scale: Poor Standing balance comment: reliant on UE support at this time                           ADL either performed or assessed with clinical judgement   ADL Overall ADL's : Needs assistance/impaired Eating/Feeding: Supervision/ safety;Sitting   Grooming: Wash/dry hands;Minimal assistance;Standing Grooming Details (indicate cue type and reason): minA standing balance Upper Body Bathing: Min guard;Sitting   Lower Body Bathing: Minimal assistance;Sit to/from stand   Upper Body Dressing : Min guard;Sitting   Lower Body Dressing: Minimal assistance;Sit to/from stand Lower Body Dressing Details (indicate cue type and reason): minA standing balance; pt with increased difficulty accessing LLE due to pain Toilet Transfer: Minimal assistance;Ambulation;Comfort height toilet;Grab bars;RW   Toileting- Clothing Manipulation and Hygiene: Minimal assistance;Sit to/from stand Toileting - Clothing Manipulation Details (indicate cue type and reason): for gown management     Functional mobility during ADLs: Minimal assistance;Rolling walker General ADL Comments: pt requiring hands on assist due to unsteadiness; appears to be a  difficult family situation regarding available assist at home     Vision Baseline Vision/History: Wears  glasses;Glaucoma Wears Glasses: At all times Patient Visual Report: No change from baseline Additional Comments: pt with baseline visual deficits; reports blurred vision in L eye due to glaucoma                 Pertinent Vitals/Pain Pain Assessment: Faces Faces Pain Scale: Hurts even more Pain Location: L knee, L hip Pain Descriptors / Indicators: Grimacing;Guarding;Sore Pain Intervention(s): Monitored during session;Limited activity within patient's tolerance;Repositioned     Hand Dominance     Extremity/Trunk Assessment Upper Extremity Assessment Upper Extremity Assessment: Generalized weakness   Lower Extremity Assessment Lower Extremity Assessment: Defer to PT evaluation       Communication Communication Communication: No difficulties   Cognition Arousal/Alertness: Awake/alert Behavior During Therapy: WFL for tasks assessed/performed(emotional at times) Overall Cognitive Status: Impaired/Different from baseline Area of Impairment: Attention;Memory;Safety/judgement;Awareness;Following commands                   Current Attention Level: Selective Memory: Decreased short-term memory Following Commands: Follows one step commands with increased time Safety/Judgement: Decreased awareness of safety Awareness: Emergent   General Comments: at end of session pt verbalizing awareness of need to have assist when OOB due to her unsteadiness, though at start of session pt attempting to get up to bathroom without staffing assist; pt requires increased time to respond to some questions as she is easily distracted   General Comments       Exercises     Shoulder Instructions      Home Living Family/patient expects to be discharged to:: Private residence Living Arrangements: Children Available Help at Discharge: Family Type of Home: Apartment Home Access: Stairs to enter [employer] of Steps: one step onto porch, step from porch into apartment   [place]: One level     Bathroom Shower/Tub: Teacher, early years/pre: [place]: Environmental consultant - 2 wheels;Cane - single point;Bedside commode   Additional Comments: pt reports she has BSC but that it does not fit in current bathroom      Prior Functioning/Environment Level of Independence: Needs assistance  Gait / Transfers Assistance Needed: reports she was using SPC/RW for mobility ADL's / Homemaking Assistance Needed: reports she was recently having increased difficulty performing ADLs and requiring assist, though unclear as to how much assist was actually being provided   Comments: fluctating reports regarding PLOF from pt and pt's daughter; unclear as to how much support is available at home         OT Problem List: Decreased strength;Decreased range of motion;Decreased activity tolerance;Impaired balance (sitting and/or standing);Decreased cognition;Decreased safety awareness;Decreased knowledge of use of DME or AE;Pain      OT Treatment/Interventions: Self-care/ADL training;Therapeutic exercise;DME and/or AE instruction;Therapeutic activities;Patient/family education;Balance training    OT Goals(Current goals can be found in the care plan section) Acute Rehab OT Goals Patient Stated Goal: to feel better OT Goal Formulation: With patient Time For Goal Achievement: 04/18/18 Potential to Achieve Goals: Good ADL Goals Pt Will Perform Grooming: with supervision;standing Pt Will Perform Lower Body Bathing: with supervision;sit to/from stand Pt Will Perform Lower Body Dressing: with supervision;sit to/from stand Pt Will Transfer to Toilet: with supervision;ambulating;regular height toilet Pt Will Perform Toileting - Clothing Manipulation and hygiene: with supervision;sit to/from stand Pt Will Perform Tub/Shower Transfer: Tub transfer;ambulating;with supervision;rolling walker  OT Frequency: Min 2X/week   Barriers to D/C:  Co-evaluation               AM-PAC PT "6 Clicks" Daily Activity     Outcome Measure Help from another person eating meals?: None Help from another person taking care of personal grooming?: A Little Help from another person toileting, which includes using toliet, bedpan, or urinal?: A Little Help from another person bathing (including washing, rinsing, drying)?: A Lot Help from another person to put on and taking off regular upper body clothing?: None Help from another person to put on and taking off regular lower body clothing?: A Lot 6 Click Score: 18   End of Session Equipment Utilized During Treatment: Gait belt;Rolling walker Nurse Communication: Mobility status  Activity Tolerance: Patient tolerated treatment well Patient left: in bed;with call bell/phone within reach;with bed alarm set;with family/visitor present  OT Visit Diagnosis: Unsteadiness on feet (R26.81);Muscle weakness (generalized) (M62.81)                Time: 4383-8184 OT Time Calculation (min): 35 min Charges:  OT General Charges $OT Visit: 1 Visit OT Evaluation $OT Eval Moderate Complexity: 1 Mod OT Treatments $Self Care/Home Management : 8-22 mins  Lou Cal, OT Supplemental Rehabilitation Services Pager [phone] Office [phone]   Raymondo Band 04/04/2018, 1:33 PM

## 2018-04-04 NOTE — Evaluation (Signed)
Physical Therapy Evaluation Patient Details Name: [name] [name] MRN: [id number] DOB: 1941-06-22 Today's Date: 04/04/2018   History of Present Illness  77 y.o. female presenting with SI/HI; Pt with reports of recent fall at home; found to be weak at family medicine office on 9/25 and to have AKI. PMHx includes HTN, HLD, GERD, back pain, OSA, PVD, MDD, psychosocial stressors, polypharmacy. CT negative for acute changes. Imaging of L knee shows no acute fracture or dislocation  Clinical Impression  Patient presents with decreased independence and safety with mobility due to weakness, pain, recent falls at home, decreased safety awareness and will benefit from skilled PT in the acute setting to maximize safety and mobility.  She will need SNF level rehab prior to d/c home.    Follow Up Recommendations Supervision/Assistance - 24 hour;SNF    Equipment Recommendations  None recommended by PT    Recommendations for Other Services       Precautions / Restrictions Precautions Precautions: Fall Restrictions Weight Bearing Restrictions: No      Mobility  Bed Mobility Overal bed mobility: Needs Assistance Bed Mobility: Supine to Sit     Supine to sit: HOB elevated;Supervision Sit to supine: Min guard   General bed mobility comments: assist for safety, slow and antalgic with back and knee pain  Transfers Overall transfer level: Needs assistance Equipment used: Rolling walker (2 wheeled) Transfers: Sit to/from Stand Sit to Stand: Min assist         General transfer comment: assist for balance/safety from EOB  Ambulation/Gait Ambulation/Gait assistance: Min assist;Mod assist Gait Distance (Feet): 35 Feet(x 2) Assistive device: Rolling walker (2 wheeled) Gait Pattern/deviations: Step-to pattern;Decreased stride length;Antalgic;Shuffle     General Gait Details: L lateral hip instability and LOB with knee/hip giving away mod A to recover; stopped to sit in desk chair in hallway  prior to return to room; assist to maneuver walker in room around obstacles safely  Stairs            Wheelchair Mobility    Modified Rankin (Stroke Patients Only)       Balance Overall balance assessment: Needs assistance Sitting-balance support: Feet supported;No upper extremity supported Sitting balance-Leahy Scale: Good     Standing balance support: Bilateral upper extremity supported Standing balance-Leahy Scale: Poor Standing balance comment: UE support needed for static balance, assist for dynamic activities                             Pertinent Vitals/Pain Pain Assessment: Faces Faces Pain Scale: Hurts whole lot Pain Location: L hip and back Pain Descriptors / Indicators: Grimacing;Guarding;Sore Pain Intervention(s): Monitored during session;Repositioned;Limited activity within patient's tolerance    Home Living Family/patient expects to be discharged to:: Private residence Living Arrangements: Children Available Help at Discharge: Family Type of Home: Apartment Home Access: Stairs to enter   [employer] of Steps: one step onto porch, step from porch into apartment Home Layout: One level Home Equipment: Environmental consultant - 2 wheels;Cane - single point;Bedside commode Additional Comments: pt reports she has BSC but that it does not fit in current bathroom    Prior Function Level of Independence: Needs assistance   Gait / Transfers Assistance Needed: reports she was using SPC/RW for mobility multiple recent falls  ADL's / Homemaking Assistance Needed: reports she was recently having increased difficulty performing ADLs and requiring assist, though unclear as to how much assist was actually being provided  Comments: reports she is home  alone much of the time     Hand Dominance   Dominant Hand: Right    Extremity/Trunk Assessment   Upper Extremity Assessment Upper Extremity Assessment: Generalized weakness    Lower Extremity  Assessment Lower Extremity Assessment: RLE deficits/detail;LLE deficits/detail RLE Deficits / Details: AROM WFL, strength grossly 3+/5 knee extension 2/5 hip flexion, significant neuropathy with burning up above knees per pt RLE Sensation: history of peripheral neuropathy LLE Deficits / Details: AROM WFL, strength grossly 3+/5 knee extension 2/5 hip flexion, significant neuropathy with burning up above knees per pt LLE Sensation: history of peripheral neuropathy    Cervical / Trunk Assessment Cervical / Trunk Assessment: Kyphotic  Communication   Communication: No difficulties  Cognition Arousal/Alertness: Awake/alert Behavior During Therapy: Anxious Overall Cognitive Status: Impaired/Different from baseline Area of Impairment: Attention;Memory;Safety/judgement;Awareness;Following commands                   Current Attention Level: Selective Memory: Decreased short-term memory Following Commands: Follows one step commands consistently;Follows one step commands with increased time Safety/Judgement: Decreased awareness of safety Awareness: Emergent   General Comments: overwhelmed with her emotional/psychosocial issues and internally distracted      General Comments General comments (skin integrity, edema, etc.): verbalizing fear of returning home knows she cannot effectively take care of herself and has anxiety/panic attacks, reassured pt and requested medication from RN    Exercises Other Exercises Other Exercises: pt demonstrated LAQ and hip adduction excercises encouraged for strength and ROM   Assessment/Plan    PT Assessment Patient needs continued PT services  PT Problem List Decreased strength;Decreased mobility;Decreased safety awareness;Decreased balance;Decreased knowledge of use of DME;Pain;Decreased activity tolerance;Decreased cognition       PT Treatment Interventions DME instruction;Therapeutic activities;Gait training;Therapeutic exercise;Patient/family  education;Balance training;Functional mobility training    PT Goals (Current goals can be found in the Care Plan section)  Acute Rehab PT Goals Patient Stated Goal: to feel better PT Goal Formulation: With patient Time For Goal Achievement: 04/11/18 Potential to Achieve Goals: Good    Frequency Min 3X/week   Barriers to discharge        Co-evaluation               AM-PAC PT "6 Clicks" Daily Activity  Outcome Measure Difficulty turning over in bed (including adjusting bedclothes, sheets and blankets)?: A Little Difficulty moving from lying on back to sitting on the side of the bed? : Unable Difficulty sitting down on and standing up from a chair with arms (e.g., wheelchair, bedside commode, etc,.)?: Unable Help needed moving to and from a bed to chair (including a wheelchair)?: A Little Help needed walking in hospital room?: A Little Help needed climbing 3-5 steps with a railing? : A Lot 6 Click Score: 13    End of Session Equipment Utilized During Treatment: Gait belt Activity Tolerance: Patient limited by fatigue Patient left: in chair;with call bell/phone within reach Nurse Communication: Other (comment)(need for anxiety medications) PT Visit Diagnosis: Other abnormalities of gait and mobility (R26.89);History of falling (Z91.81);Other symptoms and signs involving the nervous system (R29.898)    Time: 5427-0623 PT Time Calculation (min) (ACUTE ONLY): 29 min   Charges:   PT Evaluation $PT Eval Moderate Complexity: 1 Mod PT Treatments $Gait Training: 8-22 mins        Magda Kiel, [place] [facility] [phone] 04/04/2018   Reginia Naas 04/04/2018, 3:37 PM

## 2018-04-04 NOTE — Discharge Summary (Signed)
[facility] Discharge Summary  Patient name: G[place]den record number: [id number] Date of birth: [birth year] Age: 77 y.o. Gender: female Date of Admission: 04/03/2018  Date of Discharge: 04/09/2018 Admitting Physician: Kinnie Feil, MD  Primary Care Provider: [name], MD Consultants: psychiatry  Indication for Hospitalization: suicidal/homicidal ideations, AKI  Discharge Diagnoses/Problem List:  SI/HI AKI MDD PVD GERD HTN  Disposition: SNF  Discharge Condition: stable  Discharge Exam:  General: 77 y.o. female in NAD Cardio: RRR no m/r/g Lungs: CTAB, no wheezing, no rhonchi, no crackles Abdomen: Soft, non-tender to palpation, positive bowel sounds Skin: warm and dry, BLE Chronic venous stasis changes Extremities: tenderness to palpation of L patella and joint line, small cyst popliteal fossa Psych: No SI/HI, mood and affect appropriate for circumstance   Brief Hospital Course:  [name] a 77 y.o.femalepresenting with SI/HI after falling in office and found to have AKI. PMH is significant forHTN, HLD, GERD, back pain, OSA, PVD, MDD, psychosocial stressors, polypharmacy.  Patient was sent to ED from outpatient clinic for weakness and confusion, at which time she endoresed SI/HI on admission.  Psych spoke with patient, who was no longer having SI/HI, and signed off. During the rest of her stay she did not endorse SI/HI.   Patient found to have an AKI with elevated creatinine on admission that resolved with IVF and PO intake.  Patient was seen by PT who recommended SNF placement.    Issues for Follow Up:  1. SI/HI - assess if patient still having suicidal thoughts and depression status 2. AKI - recheck BMP to see if Cr has returned to baseline.  3. Peripheral neuropathy - patient was started on 100mg  TID gabapentin. Ask pt about side effects (rash) and adjust dosage as needed.  4. Social situation - pt currently living  with daughter and does not feel safe at home.  5. Knee pain - likely Baker's cyst, relieved with Tylenol at present  Significant Procedures: none  Significant Labs and Imaging:  Recent Labs  Lab 04/05/18 1103 04/06/18 0527 04/08/18 0647  WBC 3.6* 3.9* 5.3  HGB 9.8* 10.5* 10.8*  HCT 30.2* 32.1* 33.1*  PLT 158 154 160   Recent Labs  Lab 04/03/18 1204 04/04/18 0727 04/05/18 0608 04/06/18 0527 04/08/18 0647  NA 142 142 145 144 141  K 4.0 3.6 3.5 3.9 4.2  CL 106 112* 115* 115* 113*  CO2 22 20* 21* 20* 23  GLUCOSE 88 83 80 79 88  BUN 33* 32* 25* 18 15  CREATININE 2.21* 1.96* 1.64* 1.14* 0.93  CALCIUM 11.7* 9.2 8.4* 8.5* 8.9  ALKPHOS 53  --   --   --   --   AST 25  --   --   --   --   ALT 12  --   --   --   --   ALBUMIN 3.9  --   --   --   --    Iron/TIBC/Ferritin/ %Sat    Component Value Date/Time   IRON 37 04/05/2018 1103   TIBC 339 04/05/2018 1103   FERRITIN 11 04/05/2018 1103   IRONPCTSAT 11 04/05/2018 1103  Vitamin B12: 228 Folate: 13.0 UDS negative Ethanol negative   Results/Tests Pending at Time of Discharge: None  Discharge Medications:  Allergies as of 04/09/2018      Reactions   Gabapentin Itching   Was deleted since patient was taking, but now experiencing itching again.    Statins Swelling, Rash,  Other (See Comments)   Swelling involving tongue; also causes muscle pain      Medication List    STOP taking these medications   BC HEADACHE POWDER PO   nystatin cream Commonly known as:  MYCOSTATIN   pantoprazole 20 MG tablet Commonly known as:  PROTONIX   traMADol 50 MG tablet Commonly known as:  ULTRAM   VYZULTA 0.024 % Soln Generic drug:  Latanoprostene Bunod     TAKE these medications   acetaminophen 500 MG tablet Commonly known as:  TYLENOL Take 1,000 mg by mouth every 6 (six) hours as needed for moderate pain or headache.   amLODipine 5 MG tablet Commonly known as:  NORVASC TAKE 1 TABLET(5 MG) BY MOUTH DAILY What changed:     how much to take  how to take this  when to take this  additional instructions   aspirin 81 MG EC tablet Take 1 tablet (81 mg total) by mouth daily.   brimonidine 0.15 % ophthalmic solution Commonly known as:  ALPHAGAN Place 1 drop into both eyes 2 (two) times daily.   busPIRone 15 MG tablet Commonly known as:  BUSPAR Take 1 tablet (15 mg total) by mouth 3 (three) times daily.   carboxymethylcellulose 0.5 % Soln Commonly known as:  REFRESH PLUS 1 drop daily as needed.   cholecalciferol 1000 units tablet Commonly known as:  VITAMIN D Take 1,000 Units by mouth daily.   citalopram 20 MG tablet Commonly known as:  CELEXA Take 1 tablet (20 mg total) by mouth daily.   clopidogrel 75 MG tablet Commonly known as:  PLAVIX Take 1 tablet (75 mg total) by mouth every morning.   COSOPT OP Apply 1 drop to eye 2 (two) times daily.   diclofenac sodium 1 % Gel Commonly known as:  VOLTAREN Apply 4 g topically 4 (four) times daily. What changed:    when to take this  reasons to take this   docusate sodium 100 MG capsule Commonly known as:  COLACE Take 100 mg by mouth 2 (two) times daily.   Evolocumab 140 MG/ML Soaj Inject 140 mg into the skin every 14 (fourteen) days.   FISH OIL PO Take 1 tablet by mouth at bedtime.   gabapentin 100 MG capsule Commonly known as:  NEURONTIN Take 1 capsule (100 mg total) by mouth 3 (three) times daily.   metoprolol tartrate 25 MG tablet Commonly known as:  LOPRESSOR Take 0.5 tablets (12.5 mg total) by mouth 2 (two) times daily.   mirtazapine 15 MG tablet Commonly known as:  REMERON Take 15 mg by mouth at bedtime.   multivitamin with minerals Tabs tablet Take 1 tablet by mouth daily.   polyethylene glycol packet Commonly known as:  MIRALAX / GLYCOLAX Take 17 g by mouth daily as needed.   VITAMIN B 12 PO Take 1 tablet by mouth daily.       Discharge Instructions: Please refer to Patient Instructions section of EMR for  full details.  Patient was counseled important signs and symptoms that should prompt return to medical care, changes in medications, dietary instructions, activity restrictions, and follow up appointments.   Follow-Up Appointments: Contact information for after-discharge care    Destination    [facility] Preferred SNF .   Service:  Skilled Nursing Contact information: [address] [place], Richmond Pavlich, DO 04/09/2018, 11:54 AM PGY-1, [place]  [facility]

## 2018-04-05 DIAGNOSIS — R45850 Homicidal ideations: Secondary | ICD-10-CM | POA: Diagnosis not present

## 2018-04-05 DIAGNOSIS — R531 Weakness: Secondary | ICD-10-CM | POA: Diagnosis not present

## 2018-04-05 DIAGNOSIS — R410 Disorientation, unspecified: Secondary | ICD-10-CM

## 2018-04-05 DIAGNOSIS — N179 Acute kidney failure, unspecified: Secondary | ICD-10-CM

## 2018-04-05 DIAGNOSIS — R45851 Suicidal ideations: Secondary | ICD-10-CM | POA: Diagnosis not present

## 2018-04-05 LAB — BASIC METABOLIC PANEL
Anion gap: 9 (ref 5–15)
BUN: 25 mg/dL — ABNORMAL HIGH (ref 8–23)
CO2: 21 mmol/L — ABNORMAL LOW (ref 22–32)
Calcium: 8.4 mg/dL — ABNORMAL LOW (ref 8.9–10.3)
Chloride: 115 mmol/L — ABNORMAL HIGH (ref 98–111)
Creatinine, Ser: 1.64 mg/dL — ABNORMAL HIGH (ref 0.44–1.00)
GFR calc Af Amer: 33 mL/min — ABNORMAL LOW (ref >60)
GFR calc non Af Amer: 29 mL/min — ABNORMAL LOW (ref >60)
Glucose, Bld: 80 mg/dL (ref 70–99)
Potassium: 3.5 mmol/L (ref 3.5–5.1)
Sodium: 145 mmol/L (ref 135–145)

## 2018-04-05 LAB — CBC
HCT: 30.2 % — ABNORMAL LOW (ref 36.0–46.0)
Hemoglobin: 9.8 g/dL — ABNORMAL LOW (ref 12.0–15.0)
MCH: 33.2 pg (ref 26.0–34.0)
MCHC: 32.5 g/dL (ref 30.0–36.0)
MCV: 102.4 fL — ABNORMAL HIGH (ref 78.0–100.0)
Platelets: 158 10*3/uL (ref 150–400)
RBC: 2.95 MIL/uL — ABNORMAL LOW (ref 3.87–5.11)
RDW: 14.8 % (ref 11.5–15.5)
WBC: 3.6 10*3/uL — ABNORMAL LOW (ref 4.0–10.5)

## 2018-04-05 LAB — FERRITIN: Ferritin: 11 ng/mL (ref 11–307)

## 2018-04-05 LAB — IRON AND TIBC
Iron: 37 ug/dL (ref 28–170)
Saturation Ratios: 11 % (ref 10.4–31.8)
TIBC: 339 ug/dL (ref 250–450)
UIBC: 302 ug/dL

## 2018-04-05 LAB — RETICULOCYTES
RBC.: 2.95 MIL/uL — ABNORMAL LOW (ref 3.87–5.11)
Retic Count, Absolute: 103.3 10*3/uL (ref 19.0–186.0)
Retic Ct Pct: 3.5 % — ABNORMAL HIGH (ref 0.4–3.1)

## 2018-04-05 LAB — FOLATE: Folate: 13 ng/mL (ref >5.9)

## 2018-04-05 LAB — VITAMIN B12: Vitamin B-12: 228 pg/mL (ref 180–914)

## 2018-04-05 MED ORDER — AMLODIPINE BESYLATE 5 MG PO TABS
5.0000 mg | ORAL_TABLET | Freq: Every day | ORAL | Status: DC
  Administered 2018-04-05 – 2018-04-09 (×5): 5 mg via ORAL
  Filled 2018-04-05 (×5): qty 1

## 2018-04-05 NOTE — Social Work (Addendum)
[facility] is reviewing pt, this was pt's only SNF offer. Currently also waiting on [employer] authorization.   3:30pm- [facility] liaison states they will not be able to accept pt. Pt now with no SNF offers.   5:26pm- Pt PASRR still pending, pt has not received authorization from [place] and pt still has no SNF offers. Left message for APS worker Harold Hedge through [facility] at [phone].   [name], [place] Work [phone]

## 2018-04-05 NOTE — Progress Notes (Signed)
04/05/18 1100  Clinical Encounter Type  Visited With Health care provider;Patient  Visit Type Initial;Psychological support;Spiritual support;Social support  Referral From Physician  Consult/Referral To Social work  Spiritual Encounters  Spiritual Needs Prayer;Grief support;Emotional  Stress Factors  Patient Stress Factors Loss of control;Family relationships;Loss   Met with pt for over an hour, perhaps 90 mins, in response to two spiritual care consults (one for prayer and a Bible and other due to death of pt's step-son).  Bible had already been supplied from unit stock by RN.  Spoke w/ RN before and after visit w/ pt.  Pt was tearful about complicated life situation, provided compassionate presence and support, also prayed w/ patient.  Pt talked about two churches in the area which she had been part of; chaplain let pt know that chaplain would be happy to let either of those churches know she is here in the hospital so the church(es) could be an additional support.  Pt did not seem interested in that option at this time, but did let her know to contact chaplain if she decides she would like that.  Pt was upset about death in last 24 hours of her 79yo stepson who lived in [place].  Pt said outlived both of her husbands.  Pt said she has two daughters, [name] (the older) and [name] (the younger).  She said Bunny lives in pt's house in [place] w/ Bunny's boyfriend.  Pt's narrative indicated that Bunny took/took over the house from her mother, but not exactly with mother/pt's permission.  Pt spoke about feeling confused and worried and afraid.  When asked about her fear, pt said she felt physically unsafe in her apartment/duplex in [place] where she recently moved w/ her daughter [name].  Pt reported she recently learned that [name] does drugs and that was part of pt's concerns about pt's safety.  Pt also spoke about feeling unsafe because people not known to pt come into the home at various  times of day and night, including when pt was asleep; according to pt those persons are doing drugs w/ pt's daughter.  Pt also reported that the new place is arranged, either by layout or by the daughter, in such a way that pt cannot use the "potty chair" and "shower chair" she needs as safety devices.    Pt also felt unsafe in the new neighborhood, narrating the exchange of drugs she sees.  Pt also spoke about wondering whether her daughter might be giving her something that the pt isn't aware of, which might be causing the pt to feel "off," perhaps in the "Kool-Aid she tries to give me when I am upset.  Even though I like Kool-Aid, I don't drink much of it."  Pt said that both of her daughters are bi-polar and described them as difficult to live with.  Pt was very appreciative of chaplain's visit and said she felt heard and cared about.  Chaplain spoke w/ RN about eye pain pt described and told RN that pt spoke of seeing an "eye specialist" in [place] (presumably an opthamologist?), Dr. Ander Slade.    Supported pt's sources of spiritual strength, which include the Psalms and "thinking about how much more suffering Jesus went through than me" which pt finds "comforting."  Spoke w/ RN about pt's safety concerns and RN called the CSW, Westley Hummer, who was already aware of some of the pt's concerns.  Chaplain will also f/u with email to [facility].  Pls page if pt desires further chaplain  support.  Myra Gianotti resident, [phone]

## 2018-04-05 NOTE — Clinical Social Work Note (Signed)
Clinical Social Work Assessment  Patient Details  Name: [name] [name] MRN: [id number] Date of Birth: 1941-04-13  Date of referral:  04/05/18               Reason for consult:  Discharge Planning                Permission sought to share information with:  Facility Sport and exercise psychologist, Family Supports Permission granted to share information::  Yes, Verbal Permission Granted  Name::     [facility]  Agency::  [facility]; APS  Relationship::  daughter  Contact Information:  [phone]  Housing/Transportation Living arrangements for the past 2 months:  Apartment Source of Information:  Patient Patient Interpreter Needed:  None Criminal Activity/Legal Involvement Pertinent to Current Situation/Hospitalization:  No - Comment as needed Significant Relationships:  Adult Children Lives with:  Adult Children Do you feel safe going back to the place where you live?  No Need for family participation in patient care:  Yes (Comment)  Care giving concerns: Pt describes her living situation as unsafe. Pt has concerns with returning to her home, also has interest in SNF placement as she has been feeling weaker than her baseline.    Social Worker assessment / plan: CSW met with pt at bedside, introduced self and role. Pt states that she lives in a duplex apartment with her daughter (not [place]). Pt describes that her daughter has recently moved down to [facility] and pt tells this Probation officer that the daughter "uses methadone, not meth- I'm old but I'm not stupid. She bought it (methadone) off the street from two people here and then they kept coming over to the house." CSW inquired as to if there were still individuals at the home with her daughter. She says there are not but that she doesn't feel safe going home.  Per verbal hand off from chaplain pt mentioned during their visit that she also feels as though she may be "given something," by her daughters. This information was not stated verbally to  this Probation officer.   CSW spoke with pt about SNF, [facility] has offered bed to pt. Pt amenable, this is pt only offer. Pt states that she would be interested in SNF. Pt also states she has an open case with APS. Upon record review pt met with Casimer Lanius at [facility] prior to admission- there is an APS worker Shanon Brow listed. CSW called [facility], left message with Harold Hedge, [phone] to discuss disposition and pt concerns.  Employment status:  Retired Nurse, adult PT Recommendations:  [place], [place] / Referral to community resources:  [place], [place] (Comment Required: [place], Name & Number of worker spoken with)- [facility] [phone]  Patient/Family's Response to care:  Pt amenable to CSW support and assistance with finding SNF as well as speaking with APS regarding living situation.  Patient/Family's Understanding of and Emotional Response to Diagnosis, Current Treatment, and Prognosis:Pt states understanding of diagnosis, current treatment and prognosis. Pt appears visibly anxious, wringing her hands and playing with gown during assessment. Pt is emotionally afraid of returning home, but states that she "wants to put things right with her." (meaning her daughter). CSW following to support disposition.  Emotional Assessment Appearance:  Appears stated age Attitude/Demeanor/Rapport:  Engaged, Apprehensive Affect (typically observed):  Afraid/Fearful, Anxious, Overwhelmed, Pleasant Orientation:  Oriented to Self, Oriented to Place, Oriented to  Time, Oriented to Situation Alcohol /  Substance use:  Not Applicable Psych involvement (Current and /or in the community):  Outpatient Provider  Discharge Needs  Concerns to be addressed:  Care Coordination, Coping/Stress Concerns, Home Safety Concerns Readmission within the last 30 days:  No Current discharge  risk:  Psychiatric Illness, Dependent with Mobility, Lack of support system Barriers to Discharge:  Unsafe home situation, [place], LCSWA 04/05/2018, 3:29 PM

## 2018-04-05 NOTE — Progress Notes (Signed)
Pt also reported to me, and to care RN Mendel Ryder, that she sees "one and a half vision" in her L eye.  She indicated that this has been the case for a while, including prior to her hospitalization.  Also, with just chaplain present, she spoke about how this makes some activities difficult for her, such as trying to place a skillet on a stove burner eye, thinking she is setting it on burner, but actually placing it off due to her distorted vision.  [name] [name]

## 2018-04-05 NOTE — Progress Notes (Signed)
Family Medicine Teaching Service Daily Progress Note Intern Pager: [phone]  Patient name: [name] [name] record number: [id number] Date of birth: 1940-09-17 Age: 77 y.o. Gender: female  Primary Care Provider: [name], MD Consultants: psychiatry Code Status: Full code  Pt Overview and Major Events to Date:  Hospital Day 0 Admitted: 04/03/2018  Assessment and Plan: [name] [name] a 77 y.o.femalepresenting with SI/HI after falling in office and found to have AKI. PMH is significant forHTN, HLD, GERD, back pain, OSA, PVD, MDD, psychosocial stressors, polypharmacy.  #AKI on CKD-III - baseline Cr ~ 1.  2.2 on admission (BUN 33). 1.64 this am.  Takes goody's powder at home. 500cc bolus and mIVF NS given.  - encourage PO intake.  -recheck bmp on hospital followup -avoid nephrotoxic agents  #MDD/psychological stressors with SI/HI - psych determined pt no longer suicidial/homicidal. Patient takes buspar, celexa, and remeron.  - psych consulted, appreciate recommendations. - continue current meds -suicide precautions if patient voices more SI/HI - chaplain consult  #anemia/pancytopenia - wbc - 3.3, hgb 9.4, 144 platelets.  - anemia panel  # weakness/recent fall - noted to be weak at family medicine clinic. Likely due to deconditioning. Patient has leg tenderness to palpation at several locations bilaterally.  - orthosatic vitals -PT/OT eval and treat - recommend SNF  #HTN - 91/58 in ED. BP has since improved. Most recent 152/66.  -holding home amlodipine 5 and metoprolol 12.5 BID - restart amlodipine 5mg  today -continue to hold metoprolol  - monitor BP  #HLD - does not take statin therapy due to tongue swelling and muscle pain.   # GERD - on protonix 20mg  daily.   - holding for AKI  #back pain - preexisting.  Takes goody's and tramadol prn - holding tramadol for now - tylenol PRN for pain  #OSA - uncertain pt compliance. Patient does not wear cpap due to  anxiety.  #PVD - history of stents. Most recent in 2017. Patient on DAPT.  - continue plavix and aspirin daily  Fluids:none Electrolytes: replete PRN  Nutrition: heart healthy GI ppx: holding in aki DVT px: lovenox  Disposition: SNF  Medications: Scheduled Meds: . aspirin EC  81 mg Oral Daily  . brimonidine  1 drop Both Eyes BID  . busPIRone  15 mg Oral TID  . citalopram  20 mg Oral Daily  . clopidogrel  75 mg Oral q morning - 10a  . enoxaparin (LOVENOX) injection  30 mg Subcutaneous Q24H  . mirtazapine  15 mg Oral QHS   Continuous Infusions: PRN Meds: acetaminophen **OR** acetaminophen, alum & mag hydroxide-simeth, hydroxypropyl methylcellulose / hypromellose  ================================================= ================================================= Subjective:  Patient states she is feeling better today but feels like she is still groggy from waking up.  She has no SI/HI but has thoughts like 'why is god leaving me here?'.  She stated she would like a bible and would like to talk to a chaplain.   Objective: Temp:  [97.5 F (36.4 C)-98.4 F (36.9 C)] 97.5 F (36.4 C) (09/27 0526) Pulse Rate:  [73-81] 74 (09/27 0526) Resp:  [16-19] 19 (09/27 0526) BP: (111-152)/(51-122) 152/66 (09/27 0526) SpO2:  [95 %-98 %] 95 % (09/27 0526) Intake/Output 09/26 0701 - 09/27 0700 In: 600 [P.O.:600] Out: -  Physical Exam:  Gen: NAD, alert, non-toxic, well-nourished, well-appearing, sitting comfortably  HEENT: Normocephaic, atraumatic. Clear conjuctiva, no scleral icterus and injection.  CV: Regular rate and rhythm.  Normal S1-S2.    No bilateral lower extremity edema. Resp: Clear  to auscultation bilaterally.  No wheezing, rales, abnormal lung sounds.  No increased work of breathing appreciated. Psych: Cooperative with exam. Pleasant. Makes eye contact.    Laboratory: Recent Labs  Lab 04/03/18 1204 04/04/18 0727  WBC 5.3 3.3*  HGB 10.4* 9.4*  HCT 32.2* 28.9*  PLT  183 144*   Recent Labs  Lab 04/03/18 1204 04/04/18 0727  NA 142 142  K 4.0 3.6  CL 106 112*  CO2 22 20*  BUN 33* 32*  CREATININE 2.21* 1.96*  CALCIUM 11.7* 9.2  PROT 6.6  --   BILITOT 0.8  --   ALKPHOS 53  --   ALT 12  --   AST 25  --   GLUCOSE 88 83   Imaging/Diagnostic Tests: Dg Chest 2 View  Result Date: 04/03/2018 CLINICAL DATA:  Confusion.  Fall. EXAM: CHEST - 2 VIEW COMPARISON:  03/15/2018 FINDINGS: Minimal bibasilar atelectasis. Heart is normal size. No effusions or acute bony abnormality. No pneumothorax. IMPRESSION: Minimal bibasilar atelectasis. Electronically Signed   By: Rolm Baptise M.D.   On: 04/03/2018 18:06   Ct Head Wo Contrast  Result Date: 04/03/2018 CLINICAL DATA:  Fall.  Hit back of head.  On Plavix. EXAM: CT HEAD WITHOUT CONTRAST TECHNIQUE: Contiguous axial images were obtained from the base of the skull through the vertex without intravenous contrast. COMPARISON:  03/15/2018 FINDINGS: Brain: No evidence of acute infarction, hemorrhage, hydrocephalus, extra-axial collection or mass lesion/mass effect. Vascular: No hyperdense vessel or unexpected calcification. Skull: Normal. Negative for fracture or focal lesion. Sinuses/Orbits: No acute finding. Other: None IMPRESSION: 1. No acute intracranial abnormalities.  Normal brain for age. Electronically Signed   By: Kerby Moors M.D.   On: 04/03/2018 13:31   Dg Knee Complete 4 Views Left  Result Date: 04/03/2018 CLINICAL DATA:  Knee pain after fall EXAM: LEFT KNEE - COMPLETE 4+ VIEW COMPARISON:  None. FINDINGS: There is extensive chondrocalcinosis of the left femorotibial joint space. No acute fracture or dislocation. No knee effusion. There is mild joint space narrowing with small osteophytes. The patellofemoral joint space is normal. IMPRESSION: 1. No acute fracture or dislocation of the left knee. 2. Moderate left femoral tibial joint space chondrocalcinosis, as may be seen in the setting of calcium pyrophosphate  deposition disease. Electronically Signed   By: Ulyses Jarred M.D.   On: 04/03/2018 18:14      Benay Pike, MD 04/05/2018, 5:55 AM PGY-1, [place] Intern pager: [phone], text pages welcome

## 2018-04-06 DIAGNOSIS — R531 Weakness: Secondary | ICD-10-CM | POA: Diagnosis not present

## 2018-04-06 DIAGNOSIS — R410 Disorientation, unspecified: Secondary | ICD-10-CM | POA: Diagnosis not present

## 2018-04-06 DIAGNOSIS — N179 Acute kidney failure, unspecified: Secondary | ICD-10-CM | POA: Diagnosis not present

## 2018-04-06 LAB — BASIC METABOLIC PANEL
Anion gap: 9 (ref 5–15)
BUN: 18 mg/dL (ref 8–23)
CO2: 20 mmol/L — ABNORMAL LOW (ref 22–32)
Calcium: 8.5 mg/dL — ABNORMAL LOW (ref 8.9–10.3)
Chloride: 115 mmol/L — ABNORMAL HIGH (ref 98–111)
Creatinine, Ser: 1.14 mg/dL — ABNORMAL HIGH (ref 0.44–1.00)
GFR calc Af Amer: 52 mL/min — ABNORMAL LOW (ref >60)
GFR calc non Af Amer: 45 mL/min — ABNORMAL LOW (ref >60)
Glucose, Bld: 79 mg/dL (ref 70–99)
Potassium: 3.9 mmol/L (ref 3.5–5.1)
Sodium: 144 mmol/L (ref 135–145)

## 2018-04-06 LAB — CBC
HCT: 32.1 % — ABNORMAL LOW (ref 36.0–46.0)
Hemoglobin: 10.5 g/dL — ABNORMAL LOW (ref 12.0–15.0)
MCH: 33.5 pg (ref 26.0–34.0)
MCHC: 32.7 g/dL (ref 30.0–36.0)
MCV: 102.6 fL — ABNORMAL HIGH (ref 78.0–100.0)
Platelets: 154 10*3/uL (ref 150–400)
RBC: 3.13 MIL/uL — ABNORMAL LOW (ref 3.87–5.11)
RDW: 14.7 % (ref 11.5–15.5)
WBC: 3.9 10*3/uL — ABNORMAL LOW (ref 4.0–10.5)

## 2018-04-06 MED ORDER — GABAPENTIN 100 MG PO CAPS
100.0000 mg | ORAL_CAPSULE | Freq: Three times a day (TID) | ORAL | Status: DC
  Administered 2018-04-06 – 2018-04-09 (×9): 100 mg via ORAL
  Filled 2018-04-06 (×9): qty 1

## 2018-04-06 NOTE — Progress Notes (Signed)
Family Medicine Teaching Service Daily Progress Note Intern Pager: [phone]  Patient name: [name] [name] record number: [id number] Date of birth: 1941-01-25 Age: 77 y.o. Gender: female  Primary Care Provider: [name], MD Consultants: psychiatry Code Status: Full code  Pt Overview and Major Events to Date:  Hospital Day 0 Admitted: 04/03/2018  Assessment and Plan: [name] [name] a 77 y.o.femalepresenting with SI/HI after falling in office and found to have AKI. PMH is significant forHTN, HLD, GERD, back pain, OSA, PVD, MDD, psychosocial stressors, polypharmacy.  #MDD/psychological stressors with SI/HI- patient still stressing about the situation with her daughters. She enjoyed the talk with the chaplain. psych determined pt no longer suicidial/homicidal. Patient takes buspar, celexa, and remeron.  - psych consulted, appreciate recommendations. - continue current meds -suicide precautions if patient voices more SI/HI  #AKI on CKD-III - resolved. baseline Cr ~ 1. 2.2 on admission (BUN 33). 1.14 this am.Informed patient to stop taking goody's/bc powder at home - encourage PO intake.  -recheck bmp on hospital followup -avoid nephrotoxic agents  #peripheral neuropathy - patient states she used to take gabapentin for a short time but stopped because she developed itching, which she now thinks was actually due to psoriasis.   - start gabapentin 100 TID - monitor for rash/other side effects.   #anemia/neutropenia- wbc still low. Hgb improving to 10.5 this AM.  Anemia panel came back normal.    # weakness/recent fall - noted to be weak at family medicine clinic. Likely due to deconditioning. Patient has leg tenderness to palpation at several locations bilaterally. - orthosatic vitals -PT/OT eval and treat - recommend SNF  #HTN - 91/58 in ED. BP has since improved. Most recent 152/66.  -holding metoprolol 12.5 BID - amlodipine 5mg  - monitor BP  #HLD- does not  take statin therapy due to tongue swelling and muscle pain.   # GERD -on protonix 20mg  daily.  - holding for AKI.    #back pain - preexisting. Takes goody's and tramadol prn - holding tramadol for now - tylenol PRN for pain  #OSA - uncertain pt compliance. Patient does not wear cpap due to anxiety.  #PVD - history of stents. Most recent in 2017. Patient on DAPT.  - continue plavix and aspirin daily  Fluids:none Electrolytes:replete PRN Nutrition:heart healthy GI JYN:WGNFAOZ in aki DVT HY:QMVHQIO  Disposition:SNF   Medications: Scheduled Meds: . amLODipine  5 mg Oral Daily  . aspirin EC  81 mg Oral Daily  . brimonidine  1 drop Both Eyes BID  . busPIRone  15 mg Oral TID  . citalopram  20 mg Oral Daily  . clopidogrel  75 mg Oral q morning - 10a  . enoxaparin (LOVENOX) injection  30 mg Subcutaneous Q24H  . mirtazapine  15 mg Oral QHS   Continuous Infusions: PRN Meds: acetaminophen **OR** acetaminophen, alum & mag hydroxide-simeth, hydroxypropyl methylcellulose / hypromellose  ================================================= ================================================= Subjective:  Patient was concerned about her daughters. She talked extensively about their problems with drug use and how she doesn't feel safe at home.  By the end of the conversation though, patient was talking about her great-great grandchildren and how her life has been 'more good than bad'.    Patient states her legs have been feeling like they were giving out on her when she went to the bathroom, like she didn't have any energy.  She did not fall.  She also stated she was able to walk to the other end of the [employer] yesterday  with the physical therapist.   Patient says she used to take gabapentin but stopped because she broke out in an itchy rash . She now thinks this rash was due to psoriasis.  Her feet have been hurting her and she would like to try gabapentin again because  it has helped other people she knows.     Objective: Temp:  [98.2 F (36.8 C)-98.6 F (37 C)] 98.2 F (36.8 C) (09/28 0512) Pulse Rate:  [66-75] 71 (09/28 0512) Resp:  [18-20] 20 (09/28 0512) BP: (119-145)/(62-68) 145/66 (09/28 0512) SpO2:  [97 %-98 %] 98 % (09/28 0512) Intake/Output 09/27 0701 - 09/28 0700 In: 540 [P.O.:540] Out: -  Physical Exam:  Gen: NAD, alert, non-toxic, sitting comfortably  HEENT: Normocephaic, atraumatic. Clear conjuctiva, no scleral icterus and injection.  CV: Regular rate and rhythm.  Normal S1-S2.    No bilateral lower extremity edema. Resp: Clear to auscultation bilaterally.  No wheezing, rales, abnormal lung sounds.  No increased work of breathing appreciated. Abd: tender to deep palpation in RUQ.  Positive bowel sounds. Psych: Cooperative with exam. Tearful at times when talking about her daughters. Was more optimistic and improved affect at end of conversation Extremities: bilateral stasis dermatitis.   Laboratory: Recent Labs  Lab 04/04/18 0727 04/05/18 1103 04/06/18 0527  WBC 3.3* 3.6* 3.9*  HGB 9.4* 9.8* 10.5*  HCT 28.9* 30.2* 32.1*  PLT 144* 158 154   Recent Labs  Lab 04/03/18 1204 04/04/18 0727 04/05/18 0608 04/06/18 0527  NA 142 142 145 144  K 4.0 3.6 3.5 3.9  CL 106 112* 115* 115*  CO2 22 20* 21* 20*  BUN 33* 32* 25* 18  CREATININE 2.21* 1.96* 1.64* 1.14*  CALCIUM 11.7* 9.2 8.4* 8.5*  PROT 6.6  --   --   --   BILITOT 0.8  --   --   --   ALKPHOS 53  --   --   --   ALT 12  --   --   --   AST 25  --   --   --   GLUCOSE 88 83 80 79   Imaging/Diagnostic Tests: No results found.    Benay Pike, MD 04/06/2018, 8:08 AM PGY-1, [place] Intern pager: [phone], text pages welcome

## 2018-04-06 NOTE — Progress Notes (Signed)
CSW contacted health team and received auth confirmation [id number]). Awaiting SNF offers along with PASSR approval. No SNF's bed offers at this time.

## 2018-04-07 DIAGNOSIS — R531 Weakness: Secondary | ICD-10-CM | POA: Diagnosis not present

## 2018-04-07 DIAGNOSIS — R410 Disorientation, unspecified: Secondary | ICD-10-CM | POA: Diagnosis not present

## 2018-04-07 DIAGNOSIS — N179 Acute kidney failure, unspecified: Secondary | ICD-10-CM | POA: Diagnosis not present

## 2018-04-07 NOTE — Progress Notes (Signed)
Refer to previous RT note B, [place], RRT regarding CPAP on this admission. Patient is sleeping comfortably at this time no distress or complications noted.

## 2018-04-07 NOTE — Progress Notes (Signed)
Family Medicine Teaching Service Daily Progress Note Intern Pager: [phone]  Patient name: [name] [name] record number: [id number] Date of birth: 03-Aug-1940 Age: 77 y.o. Gender: female  Primary Care Provider: [name], MD Consultants: Psychiatry  Code Status: Full code   Pt Overview and Major Events to Date:  Admitted to [facility] on 04/03/18  Assessment and Plan: [name] [name] a 77 y.o.femalepresenting with SI/HI after falling in office and found to have AKI. PMH is significant forHTN, HLD, GERD, back pain, OSA, PVD, MDD, psychosocial stressors, polypharmacy.  MDD/psychological stressors with SI/HI Evaluated by psychiatry and determined to be no longer suicidial/homicidal. Patient takes buspar, celexa, and remeron. Denies current SI/HI -psych consulted, appreciate recommendations. -continue current meds -suicide precautions if patient voices more SI/HI  AKI on CKD-III - improving Cr of 1.96>1.64>1.14. Baseline Cr ~ 1.   -encourage PO intake. -avoid nephrotoxic agents -labs every other day   Peripheral neuropathy  Improving. Patient reports decreased pain.   -continue gabapentin 100 TID -monitor for rash/other side effects.   Anemia/neutropenia Hgb 9.4>9.8>10.5.  Anemia panel wnl -continue to monitor -labs every other day   Weakness/recent fall  2/2 deconditioning. Noted to be weak at [facility].   -PT/OT eval and treat- recommend SNF  HTN  Stable. Current BP 134/62.   -holding metoprolol 12.5 BID, can consider adding back if remaining hypertensive -continue amlodipine 5mg   HLD Does not take statin therapy due to tongue swelling and muscle pain.   GERD  Home protonix 20mg  daily.  -holding in setting of AKI, can consider restarting on dc     Back pain  Preexisting. Takes goody's and tramadol prn -holding tramadol for now -tylenol PRN for pain  OSA  Patient does not wear cpap due to anxiety.  PVD  History of stents, most recent in  2017. Patient on DAPT.  -continue plavix and aspirin daily  FEN/GI: heart healthy  PPx: lovenox   Disposition: SNF  Subjective:  Patient today states she overall feels more improved. States some knee pain bilaterally 2/2 fall but is able to move them. Denies any SI/HI. Reports that she has intermittent thoughts of harming herself but none today. Informed patient to inform RN staff or MD if any thoughts of harming self or others, she is agreeable. Reports improvement in her neurpathic pain this morning. States she has urinated "better" and reports no further urinary symptoms or urgency or dysuria.   Objective: Temp:  [98 F (36.7 C)-98.6 F (37 C)] 98.6 F (37 C) (09/29 0538) Pulse Rate:  [73-78] 76 (09/29 0538) Resp:  [16-18] 16 (09/29 0538) BP: (119-134)/(62-91) 126/91 (09/29 0538) SpO2:  [97 %-99 %] 97 % (09/29 0538) Physical Exam: General: awake and alert, laying in bed, NAD  Cardiovascular: RRR, no MRG  Respiratory: CTAB, no wheezes, rales, or rhonchi, able to speak full sentences, no increased WOB  Abdomen: soft, diffuse tenderness (patient states this is chronic), non distended, bowel sounds present Extremities: no edema, full ROM of knees in flexion and extension, tender to palpation of patella around area of bruising  Laboratory: Recent Labs  Lab 04/04/18 0727 04/05/18 1103 04/06/18 0527  WBC 3.3* 3.6* 3.9*  HGB 9.4* 9.8* 10.5*  HCT 28.9* 30.2* 32.1*  PLT 144* 158 154   Recent Labs  Lab 04/03/18 1204 04/04/18 0727 04/05/18 0608 04/06/18 0527  NA 142 142 145 144  K 4.0 3.6 3.5 3.9  CL 106 112* 115* 115*  CO2 22 20* 21* 20*  BUN 33* 32* 25*  18  CREATININE 2.21* 1.96* 1.64* 1.14*  CALCIUM 11.7* 9.2 8.4* 8.5*  PROT 6.6  --   --   --   BILITOT 0.8  --   --   --   ALKPHOS 53  --   --   --   ALT 12  --   --   --   AST 25  --   --   --   GLUCOSE 88 83 80 79    Imaging/Diagnostic Tests: Dg Chest 2 View  Result Date: 04/03/2018 CLINICAL DATA:  Confusion.   Fall. EXAM: CHEST - 2 VIEW COMPARISON:  03/15/2018 FINDINGS: Minimal bibasilar atelectasis. Heart is normal size. No effusions or acute bony abnormality. No pneumothorax. IMPRESSION: Minimal bibasilar atelectasis. Electronically Signed   By: Rolm Baptise M.D.   On: 04/03/2018 18:06   Ct Head Wo Contrast  Result Date: 04/03/2018 CLINICAL DATA:  Fall.  Hit back of head.  On Plavix. EXAM: CT HEAD WITHOUT CONTRAST TECHNIQUE: Contiguous axial images were obtained from the base of the skull through the vertex without intravenous contrast. COMPARISON:  03/15/2018 FINDINGS: Brain: No evidence of acute infarction, hemorrhage, hydrocephalus, extra-axial collection or mass lesion/mass effect. Vascular: No hyperdense vessel or unexpected calcification. Skull: Normal. Negative for fracture or focal lesion. Sinuses/Orbits: No acute finding. Other: None IMPRESSION: 1. No acute intracranial abnormalities.  Normal brain for age. Electronically Signed   By: Kerby Moors M.D.   On: 04/03/2018 13:31   Dg Knee Complete 4 Views Left  Result Date: 04/03/2018 CLINICAL DATA:  Knee pain after fall EXAM: LEFT KNEE - COMPLETE 4+ VIEW COMPARISON:  None. FINDINGS: There is extensive chondrocalcinosis of the left femorotibial joint space. No acute fracture or dislocation. No knee effusion. There is mild joint space narrowing with small osteophytes. The patellofemoral joint space is normal. IMPRESSION: 1. No acute fracture or dislocation of the left knee. 2. Moderate left femoral tibial joint space chondrocalcinosis, as may be seen in the setting of calcium pyrophosphate deposition disease. Electronically Signed   By: Ulyses Jarred M.D.   On: 04/03/2018 18:14    Caroline More, DO 04/07/2018, 7:09 AM PGY-2, [place] Intern pager: [phone], text pages welcome

## 2018-04-08 ENCOUNTER — Other Ambulatory Visit: Payer: Self-pay | Admitting: *Deleted

## 2018-04-08 DIAGNOSIS — R410 Disorientation, unspecified: Secondary | ICD-10-CM | POA: Diagnosis not present

## 2018-04-08 DIAGNOSIS — N179 Acute kidney failure, unspecified: Secondary | ICD-10-CM | POA: Diagnosis not present

## 2018-04-08 DIAGNOSIS — R531 Weakness: Secondary | ICD-10-CM | POA: Diagnosis not present

## 2018-04-08 LAB — CBC
HCT: 33.1 % — ABNORMAL LOW (ref 36.0–46.0)
Hemoglobin: 10.8 g/dL — ABNORMAL LOW (ref 12.0–15.0)
MCH: 33.4 pg (ref 26.0–34.0)
MCHC: 32.6 g/dL (ref 30.0–36.0)
MCV: 102.5 fL — ABNORMAL HIGH (ref 78.0–100.0)
Platelets: 160 10*3/uL (ref 150–400)
RBC: 3.23 MIL/uL — ABNORMAL LOW (ref 3.87–5.11)
RDW: 14.6 % (ref 11.5–15.5)
WBC: 5.3 10*3/uL (ref 4.0–10.5)

## 2018-04-08 LAB — BASIC METABOLIC PANEL
Anion gap: 5 (ref 5–15)
BUN: 15 mg/dL (ref 8–23)
CO2: 23 mmol/L (ref 22–32)
Calcium: 8.9 mg/dL (ref 8.9–10.3)
Chloride: 113 mmol/L — ABNORMAL HIGH (ref 98–111)
Creatinine, Ser: 0.93 mg/dL (ref 0.44–1.00)
GFR calc Af Amer: >60 mL/min (ref >60)
GFR calc non Af Amer: 57 mL/min — ABNORMAL LOW (ref >60)
Glucose, Bld: 88 mg/dL (ref 70–99)
Potassium: 4.2 mmol/L (ref 3.5–5.1)
Sodium: 141 mmol/L (ref 135–145)

## 2018-04-08 MED ORDER — ENOXAPARIN SODIUM 40 MG/0.4ML ~~LOC~~ SOLN
40.0000 mg | SUBCUTANEOUS | Status: DC
  Administered 2018-04-08: 40 mg via SUBCUTANEOUS
  Filled 2018-04-08: qty 0.4

## 2018-04-08 NOTE — Progress Notes (Signed)
Occupational Therapy Treatment Patient Details Name: [name] [name] MRN: [id number] DOB: Dec 23, 1940 Today's Date: 04/08/2018    History of present illness 77 y.o. female presenting with SI/HI; Pt with reports of recent fall at home; found to be weak at family medicine office on 9/25 and to have AKI. PMHx includes HTN, HLD, GERD, back pain, OSA, PVD, MDD, psychosocial stressors, polypharmacy. CT negative for acute changes. Imaging of L knee shows no acute fracture or dislocation   OT comments  Pt moving about the room with RW on arrival . Pt fatigued and requires seated on 3n1 for entire bath at sink level. Pt reports discomfort in back. Pt progressing toward goals.    Follow Up Recommendations  SNF(pt requesting SNF placement)    Equipment Recommendations  None recommended by OT    Recommendations for Other Services      Precautions / Restrictions Precautions Precautions: Fall       Mobility Bed Mobility               General bed mobility comments: up in room on arrival  Transfers Overall transfer level: Needs assistance Equipment used: Rolling walker (2 wheeled)   Sit to Stand: Min guard         General transfer comment: requires RW for balance    Balance Overall balance assessment: Needs assistance Sitting-balance support: Bilateral upper extremity supported;Feet supported Sitting balance-Leahy Scale: Good     Standing balance support: Bilateral upper extremity supported;During functional activity Standing balance-Leahy Scale: Fair Standing balance comment: requires UE support for balance                           ADL either performed or assessed with clinical judgement   ADL Overall ADL's : Needs assistance/impaired     Grooming: Wash/dry hands;Wash/dry face;Min guard;Sitting Grooming Details (indicate cue type and reason): requires seated position due to fatigue Upper Body Bathing: Min guard;Sitting   Lower Body Bathing: Min guard;Sit  to/from stand Lower Body Bathing Details (indicate cue type and reason): requires seated positiong with figure 4 cross Upper Body Dressing : Supervision/safety   Lower Body Dressing: Supervision/safety Lower Body Dressing Details (indicate cue type and reason): figure 4 cross to don socks Toilet Transfer: Min guard;RW;Grab bars   Toileting- Clothing Manipulation and Hygiene: Min guard       Functional mobility during ADLs: Min guard;Rolling walker General ADL Comments: pt fatigues quickly and requires seated position. pt provided color pencils and coloring book      Vision   Additional Comments: baseline glacoma   Perception     Praxis      Cognition Arousal/Alertness: Awake/alert Behavior During Therapy: WFL for tasks assessed/performed Overall Cognitive Status: Within Functional Limits for tasks assessed                                          Exercises     Shoulder Instructions       General Comments      Pertinent Vitals/ Pain       Pain Assessment: Faces Faces Pain Scale: Hurts a little bit Pain Location: back Pain Descriptors / Indicators: Discomfort Pain Intervention(s): Monitored during session  Home Living  Prior Functioning/Environment              Frequency  Min 2X/week        Progress Toward Goals  OT Goals(current goals can now be found in the care plan section)  Progress towards OT goals: Progressing toward goals  Acute Rehab OT Goals Patient Stated Goal: to get rehab before going home OT Goal Formulation: With patient Time For Goal Achievement: 04/18/18 Potential to Achieve Goals: Good ADL Goals Pt Will Perform Grooming: with supervision;standing Pt Will Perform Lower Body Bathing: with supervision;sit to/from stand Pt Will Perform Lower Body Dressing: with supervision;sit to/from stand Pt Will Transfer to Toilet: with supervision;ambulating;regular  height toilet Pt Will Perform Toileting - Clothing Manipulation and hygiene: with supervision;sit to/from stand Pt Will Perform Tub/Shower Transfer: Tub transfer;ambulating;with supervision;rolling walker  Plan Discharge plan remains appropriate    Co-evaluation                 AM-PAC PT "6 Clicks" Daily Activity     Outcome Measure   Help from another person eating meals?: None Help from another person taking care of personal grooming?: A Little Help from another person toileting, which includes using toliet, bedpan, or urinal?: A Little Help from another person bathing (including washing, rinsing, drying)?: A Little Help from another person to put on and taking off regular upper body clothing?: None Help from another person to put on and taking off regular lower body clothing?: A Little 6 Click Score: 20    End of Session Equipment Utilized During Treatment: Rolling walker  OT Visit Diagnosis: Unsteadiness on feet (R26.81);Muscle weakness (generalized) (M62.81)   Activity Tolerance Patient tolerated treatment well   Patient Left in chair;with call bell/phone within reach   Nurse Communication Mobility status;Precautions        Time: 3785-8850 OT Time Calculation (min): 17 min  Charges: OT General Charges $OT Visit: 1 Visit OT Treatments $Self Care/Home Management : 8-22 mins   Jeri Modena, OTR/L  Acute Rehabilitation Services Pager: [phone] Office: [phone] .    Parke Poisson B 04/08/2018, 10:42 AM
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
## 2018-04-08 NOTE — Progress Notes (Signed)
Physical Therapy Treatment Patient Details Name: [name] [name] MRN: [id number] DOB: [birth year] Today's Date: 04/08/2018    History of Present Illness 77 y.o. female presenting with SI/HI; Pt with reports of recent fall at home; found to be weak at family medicine office on 9/25 and to have AKI. PMHx includes HTN, HLD, GERD, back pain, OSA, PVD, MDD, psychosocial stressors, polypharmacy. CT negative for acute changes. Imaging of L knee shows no acute fracture or dislocation    PT Comments    [name] [name] received in chair and motivated to work with PT. Able to progress gait to hallway with patient requiring use of RW and up to Min A for safety and correct use of device/obstacle management. Patient requires 1 seated rest break and also reports lower back pain with radiation into L LE. Patient making good progress towards goals and PT recs remain appropriate.     Follow Up Recommendations  Supervision/Assistance - 24 hour;SNF     Equipment Recommendations  None recommended by PT    Recommendations for Other Services       Precautions / Restrictions Precautions Precautions: Fall Restrictions Weight Bearing Restrictions: No    Mobility  Bed Mobility               General bed mobility comments: up in recliner  Transfers Overall transfer level: Needs assistance Equipment used: Rolling walker (2 wheeled) Transfers: Sit to/from Stand Sit to Stand: Min guard         General transfer comment: min guard for balance and safety - use of RW   Ambulation/Gait Ambulation/Gait assistance: Min guard;Min assist Gait Distance (Feet): (hallway ambulation) Assistive device: Rolling walker (2 wheeled) Gait Pattern/deviations: Step-to pattern;Step-through pattern;Decreased stride length;Shuffle;Narrow base of support Gait velocity: decreased   General Gait Details: poor safety awareness with RW - requires up to Min A to help navigate obstacles/doorways in path; multiple verbal cues  provided to remain close to device. Requires 1 seated rest break   Stairs             Wheelchair Mobility    Modified Rankin (Stroke Patients Only)       Balance Overall balance assessment: Needs assistance Sitting-balance support: No upper extremity supported;Feet supported Sitting balance-Leahy Scale: Good     Standing balance support: Bilateral upper extremity supported;During functional activity Standing balance-Leahy Scale: Fair Standing balance comment: use of RW                            Cognition Arousal/Alertness: Awake/alert Behavior During Therapy: WFL for tasks assessed/performed Overall Cognitive Status: Within Functional Limits for tasks assessed                                        Exercises      General Comments        Pertinent Vitals/Pain Pain Assessment: Faces Faces Pain Scale: Hurts little more Pain Location: back with mobility Pain Descriptors / Indicators: Aching;Discomfort Pain Intervention(s): Limited activity within patient's tolerance;Monitored during session    Home Living                      Prior Function            PT Goals (current goals can now be found in the care plan section) Acute Rehab PT Goals Patient Stated Goal: to get  rehab before going home PT Goal Formulation: With patient Time For Goal Achievement: 04/11/18 Potential to Achieve Goals: Good Progress towards PT goals: Progressing toward goals    Frequency    Min 3X/week      PT Plan Current plan remains appropriate    Co-evaluation              AM-PAC PT "6 Clicks" Daily Activity  Outcome Measure  Difficulty turning over in bed (including adjusting bedclothes, sheets and blankets)?: A Little Difficulty moving from lying on back to sitting on the side of the bed? : A Little Difficulty sitting down on and standing up from a chair with arms (e.g., wheelchair, bedside commode, etc,.)?: Unable Help  needed moving to and from a bed to chair (including a wheelchair)?: A Little Help needed walking in hospital room?: A Little Help needed climbing 3-5 steps with a railing? : A Lot 6 Click Score: 15    End of Session Equipment Utilized During Treatment: Gait belt Activity Tolerance: Patient tolerated treatment well Patient left: in chair;with call bell/phone within reach;with nursing/sitter in room Nurse Communication: Mobility status PT Visit Diagnosis: Other abnormalities of gait and mobility (R26.89);History of falling (Z91.81);Other symptoms and signs involving the nervous system (R29.898)     Time: 1030-1056 PT Time Calculation (min) (ACUTE ONLY): 26 min  Charges:  $Gait Training: 8-22 mins $Therapeutic Activity: 8-22 mins                     Lanney Gins, PT, DPT Supplemental Physical Therapist 04/08/18 11:50 AM Pager: [phone] Office: [phone]

## 2018-04-08 NOTE — Social Work (Signed)
CSW has received PASRR: [id number] [place] still approved. Pt still without any SNF offers, have called SNFs and requested review of clinicals.   [name], [place] Work [phone]

## 2018-04-08 NOTE — Progress Notes (Signed)
RT Note:  Patient refused CPAP at this time.  Patient states she hasn't worn CPAP in 3 years due to she is claustrophobic.  Unable to tolerate the mask. She states she will look into other equipment options after discharge.

## 2018-04-08 NOTE — Progress Notes (Signed)
Family Medicine Teaching Service Daily Progress Note Intern Pager: [phone]  Patient name: [name] [name] record number: [id number] Date of birth: Jun 16, 1941 Age: 77 y.o. Gender: female  Primary Care Provider: [name], MD Consultants: Psychiatry  Code Status: Full code   Pt Overview and Major Events to Date:  Admitted to [facility] on 04/03/18  Assessment and Plan: [name] [name] a 77 y.o.femalepresenting with SI/HI after falling in office and found to have AKI. PMH is significant forHTN, HLD, GERD, back pain, OSA, PVD, MDD, psychosocial stressors, polypharmacy.  MDD/psychological stressors with SI/HI, stable: Evaluated by psychiatry and determined to be no longer suicidial/homicidal. Patient takes buspar, celexa, and remeron. Denies current SI/HI -psych consulted, appreciate recommendations. -continue current meds -suicide precautions if patient voices more SI/HI  AKI on CKD-III, improving: Cr of 1.96>1.64>1.14>0.93 on 9/30. Baseline Cr ~ 1.   -encourage PO intake. -avoid nephrotoxic agents -labs every other day   Peripheral neuropathy, improved: Patient reports decreased pain.  -continue gabapentin 100 TID -monitor for rash/other side effects.  Anemia/neutropeniam, stable: Hgb 9.4>9.8>10.5>10.8. Anemia panel wnl -continue to monitor  -labs every other day   Weakness/recent fall: 2/2 deconditioning. Noted to be weak at [facility].   -PT/OT eval and treat- recommend SNF  HTN, stable: current BP 138/74 on 9/30..   -holding metoprolol 12.5 BID, can consider adding back if remaining hypertensive -continue amlodipine 5mg   HLD: Does not take statin therapy due to tongue swelling and muscle pain. Most recent lipid panel from 11/2017 shows Chol 214, VLDL 32 and LDL 128. - No treatment at this time  GERD: Home protonix 20mg  daily.  -holding in setting of AKI, can consider restarting on dc   Back pain, chronic, stable: Preexisting.Takes goody's and tramadol  prn -holding tramadol for now -tylenol PRN for pain  OSA: Patient does not wear cpap due to anxiety.  PVD: History of stents, most recent in 2017. Patient on DAPT.  -continue plavix and aspirin daily  FEN/GI: heart healthy  PPx: lovenox   Disposition: Patient is medically cleared for SNF.  Subjective:  Patient is seen this morning sitting in her recliner. She states she feels good today and states she continues to be a little wobbly on her feet. She also states she does not have much of an appetite and is having loose stools, but denies diarrhea. She wants to walk more. She denies headaches, chest pain, shortness of breath, abdominal pain, dysuria, frequency, and neuropathic pains since starting gabapentin.  Objective: Temp:  [98.4 F (36.9 C)-99 F (37.2 C)] 98.5 F (36.9 C) (09/30 0447) Pulse Rate:  [67-83] 74 (09/30 0447) Resp:  [18] 18 (09/29 2045) BP: (109-138)/(55-74) 138/74 (09/30 0447) SpO2:  [97 %-100 %] 98 % (09/30 0447)  Physical Exam  Constitutional: She is oriented to person, place, and time. She appears well-developed and well-nourished. No distress.  HENT:  Head: Normocephalic and atraumatic.  Eyes: Conjunctivae and EOM are normal.  Neck: Normal range of motion.  Cardiovascular: Normal rate, regular rhythm, normal heart sounds and intact distal pulses.  Pulmonary/Chest: Effort normal and breath sounds normal. No respiratory distress.  Abdominal: Soft. Bowel sounds are normal. She exhibits no distension.  Musculoskeletal: Normal range of motion. She exhibits tenderness (To palpation on L patella 2/2 previous mechanical fall).  Ecchymoses noted to bilateral lower extremities, Baker's Cyst to L knee.  Lymphadenopathy:    She has no cervical adenopathy.  Neurological: She is alert and oriented to person, place, and time.  Skin: Skin is warm  and dry. Capillary refill takes less than 2 seconds.  Psychiatric: She has a normal mood and affect. Her behavior is  normal.   Laboratory: Recent Labs  Lab 04/05/18 1103 04/06/18 0527 04/08/18 0647  WBC 3.6* 3.9* 5.3  HGB 9.8* 10.5* 10.8*  HCT 30.2* 32.1* 33.1*  PLT 158 154 160   Recent Labs  Lab 04/03/18 1204  04/05/18 0608 04/06/18 0527 04/08/18 0647  NA 142   < > 145 144 141  K 4.0   < > 3.5 3.9 4.2  CL 106   < > 115* 115* 113*  CO2 22   < > 21* 20* 23  BUN 33*   < > 25* 18 15  CREATININE 2.21*   < > 1.64* 1.14* 0.93  CALCIUM 11.7*   < > 8.4* 8.5* 8.9  PROT 6.6  --   --   --   --   BILITOT 0.8  --   --   --   --   ALKPHOS 53  --   --   --   --   ALT 12  --   --   --   --   AST 25  --   --   --   --   GLUCOSE 88   < > 80 79 88   < > = values in this interval not displayed.    Imaging/Diagnostic Tests: Dg Chest 2 View  Result Date: 04/03/2018 CLINICAL DATA:  Confusion.  Fall. EXAM: CHEST - 2 VIEW COMPARISON:  03/15/2018 FINDINGS: Minimal bibasilar atelectasis. Heart is normal size. No effusions or acute bony abnormality. No pneumothorax. IMPRESSION: Minimal bibasilar atelectasis. Electronically Signed   By: Rolm Baptise M.D.   On: 04/03/2018 18:06   Ct Head Wo Contrast  Result Date: 04/03/2018 CLINICAL DATA:  Fall.  Hit back of head.  On Plavix. EXAM: CT HEAD WITHOUT CONTRAST TECHNIQUE: Contiguous axial images were obtained from the base of the skull through the vertex without intravenous contrast. COMPARISON:  03/15/2018 FINDINGS: Brain: No evidence of acute infarction, hemorrhage, hydrocephalus, extra-axial collection or mass lesion/mass effect. Vascular: No hyperdense vessel or unexpected calcification. Skull: Normal. Negative for fracture or focal lesion. Sinuses/Orbits: No acute finding. Other: None IMPRESSION: 1. No acute intracranial abnormalities.  Normal brain for age. Electronically Signed   By: Kerby Moors M.D.   On: 04/03/2018 13:31   Dg Knee Complete 4 Views Left  Result Date: 04/03/2018 CLINICAL DATA:  Knee pain after fall EXAM: LEFT KNEE - COMPLETE 4+ VIEW  COMPARISON:  None. FINDINGS: There is extensive chondrocalcinosis of the left femorotibial joint space. No acute fracture or dislocation. No knee effusion. There is mild joint space narrowing with small osteophytes. The patellofemoral joint space is normal. IMPRESSION: 1. No acute fracture or dislocation of the left knee. 2. Moderate left femoral tibial joint space chondrocalcinosis, as may be seen in the setting of calcium pyrophosphate deposition disease. Electronically Signed   By: Ulyses Jarred M.D.   On: 04/03/2018 18:14   Daisy Floro, DO 04/08/2018, 8:49 AM PGY-1, [place] Intern pager: [phone], text pages welcome

## 2018-04-09 DIAGNOSIS — R040 Epistaxis: Secondary | ICD-10-CM | POA: Diagnosis not present

## 2018-04-09 DIAGNOSIS — M5416 Radiculopathy, lumbar region: Secondary | ICD-10-CM | POA: Diagnosis not present

## 2018-04-09 DIAGNOSIS — R921 Mammographic calcification found on diagnostic imaging of breast: Secondary | ICD-10-CM | POA: Diagnosis not present

## 2018-04-09 DIAGNOSIS — F3189 Other bipolar disorder: Secondary | ICD-10-CM | POA: Diagnosis not present

## 2018-04-09 DIAGNOSIS — R410 Disorientation, unspecified: Secondary | ICD-10-CM | POA: Diagnosis not present

## 2018-04-09 DIAGNOSIS — F251 Schizoaffective disorder, depressive type: Secondary | ICD-10-CM | POA: Diagnosis not present

## 2018-04-09 DIAGNOSIS — G4733 Obstructive sleep apnea (adult) (pediatric): Secondary | ICD-10-CM | POA: Diagnosis not present

## 2018-04-09 DIAGNOSIS — F411 Generalized anxiety disorder: Secondary | ICD-10-CM | POA: Diagnosis not present

## 2018-04-09 DIAGNOSIS — I739 Peripheral vascular disease, unspecified: Secondary | ICD-10-CM | POA: Diagnosis not present

## 2018-04-09 DIAGNOSIS — G3184 Mild cognitive impairment, so stated: Secondary | ICD-10-CM | POA: Diagnosis not present

## 2018-04-09 DIAGNOSIS — N179 Acute kidney failure, unspecified: Secondary | ICD-10-CM | POA: Diagnosis not present

## 2018-04-09 DIAGNOSIS — R45850 Homicidal ideations: Secondary | ICD-10-CM | POA: Diagnosis not present

## 2018-04-09 DIAGNOSIS — Z9181 History of falling: Secondary | ICD-10-CM | POA: Diagnosis not present

## 2018-04-09 DIAGNOSIS — F331 Major depressive disorder, recurrent, moderate: Secondary | ICD-10-CM | POA: Diagnosis not present

## 2018-04-09 DIAGNOSIS — L821 Other seborrheic keratosis: Secondary | ICD-10-CM | POA: Diagnosis not present

## 2018-04-09 DIAGNOSIS — M25562 Pain in left knee: Secondary | ICD-10-CM | POA: Diagnosis not present

## 2018-04-09 DIAGNOSIS — F329 Major depressive disorder, single episode, unspecified: Secondary | ICD-10-CM | POA: Diagnosis not present

## 2018-04-09 DIAGNOSIS — R2689 Other abnormalities of gait and mobility: Secondary | ICD-10-CM | POA: Diagnosis not present

## 2018-04-09 DIAGNOSIS — K219 Gastro-esophageal reflux disease without esophagitis: Secondary | ICD-10-CM | POA: Diagnosis not present

## 2018-04-09 DIAGNOSIS — R609 Edema, unspecified: Secondary | ICD-10-CM | POA: Diagnosis not present

## 2018-04-09 DIAGNOSIS — I10 Essential (primary) hypertension: Secondary | ICD-10-CM | POA: Diagnosis not present

## 2018-04-09 DIAGNOSIS — R41841 Cognitive communication deficit: Secondary | ICD-10-CM | POA: Diagnosis not present

## 2018-04-09 DIAGNOSIS — F332 Major depressive disorder, recurrent severe without psychotic features: Secondary | ICD-10-CM | POA: Diagnosis not present

## 2018-04-09 DIAGNOSIS — R45851 Suicidal ideations: Secondary | ICD-10-CM | POA: Diagnosis not present

## 2018-04-09 DIAGNOSIS — G609 Hereditary and idiopathic neuropathy, unspecified: Secondary | ICD-10-CM | POA: Diagnosis not present

## 2018-04-09 DIAGNOSIS — M810 Age-related osteoporosis without current pathological fracture: Secondary | ICD-10-CM | POA: Diagnosis not present

## 2018-04-09 DIAGNOSIS — E041 Nontoxic single thyroid nodule: Secondary | ICD-10-CM | POA: Diagnosis not present

## 2018-04-09 DIAGNOSIS — R531 Weakness: Secondary | ICD-10-CM | POA: Diagnosis not present

## 2018-04-09 DIAGNOSIS — M6281 Muscle weakness (generalized): Secondary | ICD-10-CM | POA: Diagnosis not present

## 2018-04-09 MED ORDER — GABAPENTIN 100 MG PO CAPS: 100.0000 mg | ORAL_CAPSULE | Freq: Three times a day (TID) | ORAL | 0 refills | Status: DC

## 2018-04-09 NOTE — Plan of Care (Signed)
  Problem: Education: Goal: Knowledge of General Education information will improve Description Including pain rating scale, medication(s)/side effects and non-pharmacologic comfort measures Outcome: Progressing   Problem: Health Behavior/Discharge Planning: Goal: Ability to manage health-related needs will improve Outcome: Progressing   Problem: Clinical Measurements: Goal: Ability to maintain clinical measurements within normal limits will improve Outcome: Progressing Goal: Will remain free from infection Outcome: Progressing   Problem: Activity: Goal: Risk for activity intolerance will decrease Outcome: Progressing   Problem: Nutrition: Goal: Adequate nutrition will be maintained Outcome: Progressing   Problem: Coping: Goal: Level of anxiety will decrease Outcome: Progressing   Problem: Elimination: Goal: Will not experience complications related to bowel motility Outcome: Progressing Goal: Will not experience complications related to urinary retention Outcome: Progressing   Problem: Pain Managment: Goal: General experience of comfort will improve Outcome: Progressing   Problem: Safety: Goal: Ability to remain free from injury will improve Outcome: Progressing   Problem: Skin Integrity: Goal: Risk for impaired skin integrity will decrease Outcome: Progressing  Forestine Chute, RN 04/09/2018 2:11 AM

## 2018-04-09 NOTE — Social Work (Signed)
CSW spoke with Juliann Pulse, RN liaison from [facility], they have come to meet pt and are able to extend bed offer to pt. Called [employer] and updated authorization (still valid). Paged attending MD service for summary and scripts.   [name], [place] Work [phone]

## 2018-04-09 NOTE — Social Work (Signed)
Clinical Social Worker facilitated patient discharge including contacting patient family and facility to confirm patient discharge plans.  Clinical information faxed to facility and family agreeable with plan. Pt daughter [name]) will transport pt to [facility]. RN to call [phone] with report  prior to discharge.  Clinical Social Worker will sign off for now as social work intervention is no longer needed. Please consult [place] again if new need arises.  [name], [place] Social Worker

## 2018-04-09 NOTE — Progress Notes (Signed)
Patient discharging to [facility] health care report given to [facility] Nurse

## 2018-04-09 NOTE — Clinical Social Work Placement (Addendum)
   CLINICAL SOCIAL WORK PLACEMENT  NOTE [facility]  Date:  04/09/2018  Patient Details  Name: [name] [name] MRN: [id number] Date of Birth: October 06, 1940  Clinical Social Work is seeking post-discharge placement for this patient at the [place] level of care (*CSW will initial, date and re-position this form in  chart as items are completed):  No   Patient/family provided with [name] Work Department's list of facilities offering this level of care within the geographic area requested by the patient (or if unable, by the patient's family).  Yes   Patient/family informed of their freedom to choose among providers that offer the needed level of care, that participate in Medicare, Medicaid or managed care program needed by the patient, have an available bed and are willing to accept the patient.  Yes   Patient/family informed of [facility]'s ownership interest in [name] and [name], as well as of the fact that they are under no obligation to receive care at these facilities.  PASRR submitted to E[employer] on 04/05/18     PASRR number received on 04/08/18     Existing PASRR number confirmed on       FL2 transmitted to all facilities in geographic area requested by pt/family on 04/05/18     FL2 transmitted to all facilities within larger geographic area on       Patient informed that his/her managed care company has contracts with or will negotiate with certain facilities, including the following:        Yes   Patient/family informed of bed offers received.  Patient chooses bed at [name]     Physician recommends and patient chooses bed at      Patient to be transferred to [name] on 04/09/18.  Patient to be transferred to facility by pt daughter [name]  Patient family notified on 04/09/18 of transfer.  Name of family member notified:  pt daughter [name]  PHYSICIAN Please prepare priority discharge  summary, including medications, Please prepare prescriptions     Additional Comment:    _______________________________________________ [name], LCSWA 04/09/2018, 11:20 AM

## 2018-04-09 NOTE — Progress Notes (Signed)
Family Medicine Teaching Service Daily Progress Note Intern Pager: [phone]  Patient name: [place] record number: [id number] Date of birth: 1940/11/20 Age: 77 y.o. Gender: female  Primary Care Provider: [name], MD Consultants: Psychiatry  Code Status: Full code   Pt Overview and Major Events to Date:  Admitted to [facility] on 04/03/18  Assessment and Plan: [name] a 77 y.o.femalepresenting with SI/HI after falling in office and found to have AKI. PMH is significant forHTN, HLD, GERD, back pain, OSA, PVD, MDD, psychosocial stressors, polypharmacy.  MDD/psychological stressors with SI/HI, stable: Cleared by psychiatry.  On buspar, celexa, remeron.  Continues to deny SI/HI. -psych consulted, appreciate recommendations. -continue current meds -suicide precautions if patient voices more SI/HI  AKI on CKD-III: improved, at baseline  Cr of 1.96>1.64>1.14>0.93 on 9/30. Baseline Cr ~ 1.   -encourage PO intake. -avoid nephrotoxic agents -labs every other day   Peripheral neuropathy: Improved Patient reports decreased pain.  -continue gabapentin 100 TID -monitor for rash/other side effects.  Anemia/neutropenia: stable  Hgb 9.4>9.8>10.5>10.8. Anemia panel wnl -continue to monitor  -labs every other day   Weakness/recent fall:  2/2 deconditioning.  Reports that she had imaging at [facility] of left knee and hip that were negative for fracture. -PT/OT eval and treat- recommend SNF  Left Knee Pain Since fall a few weeks ago.  Reports she had imaging at [facility] of knee and hip and did not have a fracture.  States that she has noticed some increased swelling behind left knee. Mildly TTP of popliteal fossa, small cyst palpated.  No redness of LLE aside from chronic venous stasis changes, has been on Lovenox PPx. No swelling of LLE aside from knee.  Doubt DVT, but will monitor closely.  Working with PT.  Pain well controlled with tylenol. - suspect Baker's  cyst - will cont to monitor closely - tylenol prn pain - cont PT  Diarrhea Notes 3 episodes of diarrhea this AM, last watery and foul smelling.  No Abx use during this hospitalization. - cont to monitor - doubt C diff, but will order if continues  HTN, stable BP this AM 144/73. -holding metoprolol 12.5 BID, can consider adding back if remaining hypertensive -continue amlodipine 5mg   HLD: Chronic Does not take statin therapy due to tongue swelling and muscle pain. Most recent lipid panel from 11/2017 shows Chol 214, VLDL 32 and LDL 128. - No treatment at this time  GERD: Chronic  Home protonix 20mg  daily.  -holding in setting of AKI, can consider restarting on dc   Back pain, chronic, stable:  Takes goody's and tramadol prn -holding tramadol for now -tylenol PRN for pain  OSA: Patient does not wear cpap due to anxiety.  PVD: History of stents, most recent in 2017. Patient on DAPT.  -cont plavix and aspirin daily  FEN/GI: heart healthy  PPx: lovenox   Disposition: awaiting SNF placement, medically cleared.  Subjective:  Notes that mood is good today.  Has had diarrhea three times this AM, first soft, then loose, then watery.  Notes foul odor.    Objective: Temp:  [98 F (36.7 C)-98.5 F (36.9 C)] 98 F (36.7 C) (10/01 0553) Pulse Rate:  [72-75] 72 (10/01 0553) Resp:  [16-18] 18 (10/01 0553) BP: (113-144)/(66-73) 144/73 (10/01 0553) SpO2:  [96 %-99 %] 98 % (10/01 0553)  Physical Exam: General: 77 y.o. female in NAD Cardio: RRR no m/r/g Lungs: CTAB, no wheezing, no rhonchi, no crackles Abdomen: Soft, non-tender to palpation, positive bowel  sounds Skin: warm and dry, BLE Chronic venous stasis changes Extremities: tenderness to palpation of L patella and joint line, small cyst popliteal fossa Psych: No SI/HI, mood and affect appropriate for circumstance  Laboratory: Recent Labs  Lab 04/05/18 1103 04/06/18 0527 04/08/18 0647  WBC 3.6* 3.9* 5.3   HGB 9.8* 10.5* 10.8*  HCT 30.2* 32.1* 33.1*  PLT 158 154 160   Recent Labs  Lab 04/03/18 1204  04/05/18 0608 04/06/18 0527 04/08/18 0647  NA 142   < > 145 144 141  K 4.0   < > 3.5 3.9 4.2  CL 106   < > 115* 115* 113*  CO2 22   < > 21* 20* 23  BUN 33*   < > 25* 18 15  CREATININE 2.21*   < > 1.64* 1.14* 0.93  CALCIUM 11.7*   < > 8.4* 8.5* 8.9  PROT 6.6  --   --   --   --   BILITOT 0.8  --   --   --   --   ALKPHOS 53  --   --   --   --   ALT 12  --   --   --   --   AST 25  --   --   --   --   GLUCOSE 88   < > 80 79 88   < > = values in this interval not displayed.    Imaging/Diagnostic Tests: No results found.   Edahi Kroening, Bernita Raisin, DO 04/09/2018, 8:13 AM PGY-1, [place] Intern pager: [phone], text pages welcome

## 2018-04-09 NOTE — Progress Notes (Signed)
[name] [name] to be D/C'd  per MD order. Discussed with the patient and all questions fully answered.  VSS, Skin clean, dry and intact without evidence of skin break down, no evidence of skin tears noted.  IV catheter discontinued intact. Site without signs and symptoms of complications. Dressing and pressure applied.  An After Visit Summary was printed and given to the patient. Patient received prescription.  D/c education completed with patient/family including follow up instructions, medication list, d/c activities limitations if indicated, with other d/c instructions as indicated by MD - patient able to verbalize understanding, all questions fully answered.   Patient instructed to return to ED, call 911, or call MD for any changes in condition.   Patient to be escorted via [place], and D/C to [place]  via private auto with daughter.

## 2018-04-09 NOTE — Discharge Instructions (Signed)
You were admitted to the hospital for being weak, dehydrated, and to make sure that your mental health was okay.  You have improved so much since you have been with [place] and we are so glad you are feeling better!   Major Depressive Disorder, Adult Major depressive disorder (MDD) is a mental health condition. It may also be called clinical depression or unipolar depression. MDD usually causes feelings of sadness, hopelessness, or helplessness. MDD can also cause physical symptoms. It can interfere with work, school, relationships, and other everyday activities. MDD may be mild, moderate, or severe. It may occur once (single episode major depressive disorder) or it may occur multiple times (recurrent major depressive disorder). What are the causes? The exact cause of this condition is not known. MDD is most likely caused by a combination of things, which may include:  Genetic factors. These are traits that are passed along from parent to child.  Individual factors. Your personality, your behavior, and the way you handle your thoughts and feelings may contribute to MDD. This includes personality traits and behaviors learned from others.  Physical factors, such as: ? Differences in the part of your brain that controls emotion. This part of your brain may be different than it is in people who do not have MDD. ? Long-term (chronic) medical or psychiatric illnesses.  Social factors. Traumatic experiences or major life changes may play a role in the development of MDD.  What increases the risk? This condition is more likely to develop in women. The following factors may also make you more likely to develop MDD:  A family history of depression.  Troubled family relationships.  Abnormally low levels of certain brain chemicals.  Traumatic events in childhood, especially abuse or the loss of a parent.  Being under a lot of stress, or long-term stress, especially from upsetting life experiences or  losses.  A history of: ? Chronic physical illness. ? Other mental health disorders. ? Substance abuse.  Poor living conditions.  Experiencing social exclusion or discrimination on a regular basis.  What are the signs or symptoms? The main symptoms of MDD typically include:  Constant depressed or irritable mood.  Loss of interest in things and activities.  MDD symptoms may also include:  Sleeping or eating too much or too little.  Unexplained weight change.  Fatigue or low energy.  Feelings of worthlessness or guilt.  Difficulty thinking clearly or making decisions.  Thoughts of suicide or of harming others.  Physical agitation or weakness.  Isolation.  Severe cases of MDD may also occur with other symptoms, such as:  Delusions or hallucinations, in which you imagine things that are not real (psychotic depression).  Low-level depression that lasts at least a year (chronic depression or persistent depressive disorder).  Extreme sadness and hopelessness (melancholic depression).  Trouble speaking and moving (catatonic depression).  How is this diagnosed? This condition may be diagnosed based on:  Your symptoms.  Your medical history, including your mental health history. This may involve tests to evaluate your mental health. You may be asked questions about your lifestyle, including any drug and alcohol use, and how long you have had symptoms of MDD.  A physical exam.  Blood tests to rule out other conditions.  You must have a depressed mood and at least four other MDD symptoms most of the day, nearly every day in the same 2-week timeframe before your health care provider can confirm a diagnosis of MDD. How is this treated? This condition is  usually treated by mental health professionals, such as psychologists, psychiatrists, and clinical social workers. You may need more than one type of treatment. Treatment may include:  Psychotherapy. This is also called  talk therapy or counseling. Types of psychotherapy include: ? Cognitive behavioral therapy (CBT). This type of therapy teaches you to recognize unhealthy feelings, thoughts, and behaviors, and replace them with positive thoughts and actions. ? Interpersonal therapy (IPT). This helps you to improve the way you relate to and communicate with others. ? Family therapy. This treatment includes members of your family.  Medicine to treat anxiety and depression, or to help you control certain emotions and behaviors.  Lifestyle changes, such as: ? Limiting alcohol and drug use. ? Exercising regularly. ? Getting plenty of sleep. ? Making healthy eating choices. ? Spending more time outdoors.  Treatments involving stimulation of the brain can be used in situations with extremely severe symptoms, or when medicine or other therapies do not work over time. These treatments include electroconvulsive therapy, transcranial magnetic stimulation, and vagal nerve stimulation. Follow these instructions at home: Activity  Return to your normal activities as told by your health care provider.  Exercise regularly and spend time outdoors as told by your health care provider. General instructions  Take over-the-counter and prescription medicines only as told by your health care provider.  Do not drink alcohol. If you drink alcohol, limit your alcohol intake to no more than 1 drink a day for nonpregnant women and 2 drinks a day for men. One drink equals 12 oz of beer, 5 oz of wine, or 1 oz of hard liquor. Alcohol can affect any antidepressant medicines you are taking. Talk to your health care provider about your alcohol use.  Eat a healthy diet and get plenty of sleep.  Find activities that you enjoy doing, and make time to do them.  Consider joining a support group. Your health care provider may be able to recommend a support group.  Keep all follow-up visits as told by your health care provider. This is  important. Where to find more information: [employer] on Mental Illness  www.nami.org  U.S. National Institute of Mental Health  https://carter.com/  National Suicide Prevention Lifeline  1-800-273-TALK (640)835-6780). This is free, 24-hour help.  Contact a health care provider if:  Your symptoms get worse.  You develop new symptoms. Get help right away if:  You self-harm.  You have serious thoughts about hurting yourself or others.  You see, hear, taste, smell, or feel things that are not present (hallucinate). This information is not intended to replace advice given to you by your health care provider. Make sure you discuss any questions you have with your health care provider. Document Released: 10/21/2012 Document Revised: 03/02/2016 Document Reviewed: 01/05/2016 Elsevier Interactive Patient Education  [employer].

## 2018-04-09 NOTE — Plan of Care (Signed)

## 2018-04-10 ENCOUNTER — Ambulatory Visit: Payer: Self-pay | Admitting: *Deleted

## 2018-04-10 ENCOUNTER — Other Ambulatory Visit: Payer: Self-pay | Admitting: *Deleted

## 2018-04-10 NOTE — Patient Outreach (Signed)
[facility]) Care Management  04/10/2018  [name] [name] 03/09/1941 [id number]   Pt released from hospital to [facility] SNF. This CSW has alerted  [facility] CSW who follows pt's at [facility] SNF for follow up.   Eduard Clos, MSW, [place] Worker  [place] [phone]

## 2018-04-11 ENCOUNTER — Other Ambulatory Visit: Payer: Self-pay | Admitting: *Deleted

## 2018-04-11 ENCOUNTER — Telehealth: Payer: Self-pay | Admitting: Licensed Clinical Social Worker

## 2018-04-11 NOTE — Patient Outreach (Signed)
[facility]) Care Management  04/11/2018  LILLYANNE BRADBURN [birth year] [id number]   Notified by [facility] LCSW that was following patient in the community that patient was being followed by [facility]. She will send a message to [facility] LCSW with a report.   Plan to refer to [facility] LCSW that covers [facility] to follow while at facility.  Will collaborate with [facility] and [facility] LCSW care teams.   Royetta Crochet. Laymond Purser, RN, BSN, [place] [phone]) Business Cell  [phone]) Toll Free Office

## 2018-04-11 NOTE — Progress Notes (Signed)
Type of Service: Clinical Social Work  LCSW returned phone call to patient's APS worker Harold Hedge [phone].  Wanted to know if patient was at rehab.  Left message to confirm patient was discharged to SNF. Also provided APS worker with phone number for [facility] social worker Marcie Bal so that he could coordinate services and support for patient.  Casimer Lanius, LCSW Licensed Clinical Social Worker [place]   [phone] 12:21 PM

## 2018-04-12 ENCOUNTER — Other Ambulatory Visit: Payer: Self-pay | Admitting: Pharmacist

## 2018-04-12 ENCOUNTER — Ambulatory Visit: Payer: Self-pay | Admitting: Pharmacist

## 2018-04-12 DIAGNOSIS — I739 Peripheral vascular disease, unspecified: Secondary | ICD-10-CM | POA: Diagnosis not present

## 2018-04-12 DIAGNOSIS — K219 Gastro-esophageal reflux disease without esophagitis: Secondary | ICD-10-CM | POA: Diagnosis not present

## 2018-04-12 DIAGNOSIS — I10 Essential (primary) hypertension: Secondary | ICD-10-CM | POA: Diagnosis not present

## 2018-04-12 DIAGNOSIS — F329 Major depressive disorder, single episode, unspecified: Secondary | ICD-10-CM | POA: Diagnosis not present

## 2018-04-12 NOTE — Patient Outreach (Signed)
[name]) Care Management  04/12/2018  SOPHEE MCKIMMY [birth year] [id number]   Unfortunately, patient was discharged to a SNF following her hospitalization and is expected to be there more than 10 days.  She is being followed by [place] Workers.  Plan: Close pharmacy case. Glad to reopen upon patient's discharge.  Elayne Guerin, PharmD, [place] Clinical Pharmacist [phone]

## 2018-04-17 ENCOUNTER — Other Ambulatory Visit: Payer: Self-pay | Admitting: *Deleted

## 2018-04-17 NOTE — Patient Outreach (Signed)
[facility]) Care Management  04/17/2018  KYNZLEY DOWSON 1941-05-19 [id number]   CSW had received referral from [place] to follow patient while at [facility]. Patient was previously followed by [facility] LCSW, [place] Pharmacist, Alwyn Ren prior to hospitalization. CSW met with patient at [facility] (room#: [id number]) to confirm patient's plans to return home with her older daughter, [name] at discharge. Patient reports that she has a home assessment planned for this coming Monday - her daughter plans to pick her up and take her to the apartment so that the OT could see what other things they need to work on before she can discharge. Patient acknowledged APS report was open (worker = Harold Hedge [phone]) CSW had left voicemail for Shanon Brow and will await returned call.    Raynaldo Opitz, LCSW [facility]  Clinical Social Worker cell #: [phone]

## 2018-04-22 DIAGNOSIS — R609 Edema, unspecified: Secondary | ICD-10-CM | POA: Diagnosis not present

## 2018-04-22 DIAGNOSIS — F332 Major depressive disorder, recurrent severe without psychotic features: Secondary | ICD-10-CM | POA: Diagnosis not present

## 2018-04-22 DIAGNOSIS — G609 Hereditary and idiopathic neuropathy, unspecified: Secondary | ICD-10-CM | POA: Diagnosis not present

## 2018-04-22 DIAGNOSIS — I739 Peripheral vascular disease, unspecified: Secondary | ICD-10-CM | POA: Diagnosis not present

## 2018-04-24 ENCOUNTER — Other Ambulatory Visit: Payer: Self-pay | Admitting: *Deleted

## 2018-04-24 NOTE — Patient Outreach (Signed)
[facility]) Care Management  04/24/2018  AYDEE MCNEW [birth year] [id number]   Met with Eugenie Birks, discharge planner at [facility]. She reports that patient wants to go home. APS is involved with patient and have visited her at facility.  She has been assessed by [facility] but the monthly cost was too high for patient. There is an elder care assistant who is working with patient today and has found a semi-private room at [place] that is much less than [facility]. However, patient is wanting to go home.   No discharge date set but Marita [place] feels it maybe as soon as next week. RNCM discussed that patient is newly active to [facility] care management and we will follow up at discharge, requested that she let RNCM know of discharge date.   Plan to update care team. Royetta Crochet. Laymond Purser, RN, BSN, [place] [phone]) Business Cell  [phone]) Toll Free Office

## 2018-04-25 ENCOUNTER — Other Ambulatory Visit: Payer: Self-pay | Admitting: *Deleted

## 2018-04-25 ENCOUNTER — Ambulatory Visit: Payer: Self-pay | Admitting: Pharmacist

## 2018-04-25 ENCOUNTER — Other Ambulatory Visit: Payer: Self-pay | Admitting: Pharmacist

## 2018-04-25 NOTE — Patient Outreach (Signed)
[facility]) Care Management  04/25/2018  SHAKINAH NAVIS [birth year] [id number]   Patient was called per indication from social work. HIPAA identifiers were obtained. Patient reported she is still in a facility and is not sure when she will be able to go home.  Plan: Patient will be called back in 5-7 business days to see if she is discharged.   Elayne Guerin, PharmD, [place] Clinical Pharmacist [phone]

## 2018-04-26 ENCOUNTER — Ambulatory Visit: Payer: Self-pay | Admitting: *Deleted

## 2018-04-26 NOTE — Patient Outreach (Addendum)
[facility]) Care Management  04/26/2018  [name] [name] 1940/08/17 [id number]   CSW met with patient at [facility] to discuss discharge plans. Patient reports that she has been thinking about going to an assisted living, but is not quite sure she's ready to make the commitment. Patient reports that she needs to discuss the financials with her daughter who she state has been doing much better and has been coming to visit her at [facility]. Patient reports that the home assessment is this coming Monday and is looking forward to it. CSW visit was interrupted by PT to take her to therapy. CSW will check back with patient next week.    Raynaldo Opitz, LCSW [facility]  Clinical Social Worker cell #: [phone]

## 2018-04-29 DIAGNOSIS — N179 Acute kidney failure, unspecified: Secondary | ICD-10-CM | POA: Diagnosis not present

## 2018-04-29 DIAGNOSIS — M6281 Muscle weakness (generalized): Secondary | ICD-10-CM | POA: Diagnosis not present

## 2018-04-29 DIAGNOSIS — I739 Peripheral vascular disease, unspecified: Secondary | ICD-10-CM | POA: Diagnosis not present

## 2018-04-29 DIAGNOSIS — I10 Essential (primary) hypertension: Secondary | ICD-10-CM | POA: Diagnosis not present

## 2018-04-30 ENCOUNTER — Other Ambulatory Visit: Payer: Self-pay | Admitting: *Deleted

## 2018-04-30 NOTE — Patient Outreach (Signed)
[facility]) Care Management  04/30/2018  [name] [name] 07-08-1941 [id number]   Notified by Marita [place] discharge planner at SNF that patient discharged home today with her daughter. Plan to notify [facility] CM team of discharge for any needed follow up. Royetta Crochet. Laymond Purser, RN, BSN, [place] [phone]) Business Cell  [phone]) Toll Free Office

## 2018-05-02 ENCOUNTER — Ambulatory Visit: Payer: Self-pay | Admitting: *Deleted

## 2018-05-02 DIAGNOSIS — F339 Major depressive disorder, recurrent, unspecified: Secondary | ICD-10-CM | POA: Diagnosis not present

## 2018-05-02 DIAGNOSIS — Z85828 Personal history of other malignant neoplasm of skin: Secondary | ICD-10-CM | POA: Diagnosis not present

## 2018-05-02 DIAGNOSIS — H409 Unspecified glaucoma: Secondary | ICD-10-CM | POA: Diagnosis not present

## 2018-05-02 DIAGNOSIS — Z87891 Personal history of nicotine dependence: Secondary | ICD-10-CM | POA: Diagnosis not present

## 2018-05-02 DIAGNOSIS — R600 Localized edema: Secondary | ICD-10-CM | POA: Diagnosis not present

## 2018-05-02 DIAGNOSIS — M353 Polymyalgia rheumatica: Secondary | ICD-10-CM | POA: Diagnosis not present

## 2018-05-02 DIAGNOSIS — M5416 Radiculopathy, lumbar region: Secondary | ICD-10-CM | POA: Diagnosis not present

## 2018-05-02 DIAGNOSIS — I10 Essential (primary) hypertension: Secondary | ICD-10-CM | POA: Diagnosis not present

## 2018-05-02 DIAGNOSIS — H524 Presbyopia: Secondary | ICD-10-CM | POA: Diagnosis not present

## 2018-05-02 DIAGNOSIS — E041 Nontoxic single thyroid nodule: Secondary | ICD-10-CM | POA: Diagnosis not present

## 2018-05-02 DIAGNOSIS — M533 Sacrococcygeal disorders, not elsewhere classified: Secondary | ICD-10-CM | POA: Diagnosis not present

## 2018-05-02 DIAGNOSIS — K219 Gastro-esophageal reflux disease without esophagitis: Secondary | ICD-10-CM | POA: Diagnosis not present

## 2018-05-02 DIAGNOSIS — Z9181 History of falling: Secondary | ICD-10-CM | POA: Diagnosis not present

## 2018-05-02 DIAGNOSIS — M810 Age-related osteoporosis without current pathological fracture: Secondary | ICD-10-CM | POA: Diagnosis not present

## 2018-05-02 DIAGNOSIS — Z7902 Long term (current) use of antithrombotics/antiplatelets: Secondary | ICD-10-CM | POA: Diagnosis not present

## 2018-05-02 DIAGNOSIS — Z7982 Long term (current) use of aspirin: Secondary | ICD-10-CM | POA: Diagnosis not present

## 2018-05-02 DIAGNOSIS — M47892 Other spondylosis, cervical region: Secondary | ICD-10-CM | POA: Diagnosis not present

## 2018-05-02 DIAGNOSIS — G4733 Obstructive sleep apnea (adult) (pediatric): Secondary | ICD-10-CM | POA: Diagnosis not present

## 2018-05-02 DIAGNOSIS — G629 Polyneuropathy, unspecified: Secondary | ICD-10-CM | POA: Diagnosis not present

## 2018-05-02 DIAGNOSIS — I739 Peripheral vascular disease, unspecified: Secondary | ICD-10-CM | POA: Diagnosis not present

## 2018-05-02 DIAGNOSIS — E785 Hyperlipidemia, unspecified: Secondary | ICD-10-CM | POA: Diagnosis not present

## 2018-05-05 ENCOUNTER — Telehealth: Payer: Self-pay | Admitting: Pharmacist Clinician (PhC)/ Clinical Pharmacy Specialist

## 2018-05-05 DIAGNOSIS — E7800 Pure hypercholesterolemia, unspecified: Secondary | ICD-10-CM

## 2018-05-05 NOTE — Telephone Encounter (Signed)
Mailed new Safety Net application and lab order 

## 2018-05-06 ENCOUNTER — Other Ambulatory Visit: Payer: Self-pay | Admitting: *Deleted

## 2018-05-06 ENCOUNTER — Telehealth: Payer: Self-pay | Admitting: Family Medicine

## 2018-05-06 ENCOUNTER — Other Ambulatory Visit: Payer: Self-pay | Admitting: Pharmacist

## 2018-05-06 NOTE — Patient Outreach (Signed)
[facility]) Care Management  [place]   05/06/2018  [name] [name] 1941-06-12 [id number]  Subjective: Patient was called to follow up after being discharged from a SNF. HIPAA identifiers were obtained. Patient is a 77 year old female with multiple medical conditions including but not limited to:  GERD, anxiety, hyperlipidemia, hypertension, lumbar back pain, resting tremor, polymyalgia rheumatica, and history of multiple falls. Patient was recently hospitalized for secondary psychiatric complications after suffering a fall.  Patient was discharged to a SNF following her hospital release.   Objective: Lab Results  Component Value Date   CREATININE 0.93 04/08/2018   CREATININE 1.14 (H) 04/06/2018   CREATININE 1.64 (H) 04/05/2018    Lab Results  Component Value Date   HGBA1C 4.9 03/04/2014    Lipid Panel     Component Value Date/Time   CHOL 214 (H) 11/08/2017 1228   TRIG 93 11/08/2017 1228   HDL 67 11/08/2017 1228   CHOLHDL 3.2 11/08/2017 1228   CHOLHDL 4.6 07/31/2016 1046   VLDL 32 (H) 07/31/2016 1046   LDLCALC 128 (H) 11/08/2017 1228    BP Readings from Last 3 Encounters:  04/09/18 (!) 144/73  03/22/18 138/72  01/02/18 134/78    Allergies  Allergen Reactions  . Gabapentin Itching    Was deleted since patient was taking, but now experiencing itching again.   . Statins Swelling, Rash and Other (See Comments)    Swelling involving tongue; also causes muscle pain    Medications Reviewed Today    Reviewed by Elayne Guerin, [facility] (Pharmacist) on 05/06/18 at 1222  Med List Status: <None>  Medication Order Taking? Sig Documenting Provider Last Dose Status Informant  acetaminophen (TYLENOL) 500 MG tablet [id number] Yes Take 1,000 mg by mouth every 6 (six) hours as needed for moderate pain or headache. [provider] Taking Active Self  amLODipine (NORVASC) 5 MG tablet [id number] Yes TAKE 1 TABLET(5 MG) BY MOUTH DAILY  Patient taking  differently:  Take 5 mg by mouth daily.    [name], MD Taking Active Self  aspirin EC 81 MG EC tablet [id number] Yes Take 1 tablet (81 mg total) by mouth daily. Eileen Stanford, PA-C Taking Active Self  brimonidine (ALPHAGAN) 0.15 % ophthalmic solution [id number] Yes Place 1 drop into both eyes 2 (two) times daily. [provider] Taking Active Self  busPIRone (BUSPAR) 15 MG tablet [id number] Yes Take 1 tablet (15 mg total) by mouth 3 (three) times daily. [name], [place], DO Taking Active Self           Med Note Nadara Eaton Apr 03, 2018  1:53 PM) Ran out  carboxymethylcellulose (REFRESH PLUS) 0.5 % SOLN [id number] Yes 1 drop daily as needed. [provider] Taking Active Self  cholecalciferol (VITAMIN D) 1000 units tablet [id number] Yes Take 1,000 Units by mouth daily. [provider] Taking Active Self  citalopram (CELEXA) 20 MG tablet [id number] Yes Take 1 tablet (20 mg total) by mouth daily. [name], MD Taking Active Self  clopidogrel (PLAVIX) 75 MG tablet [id number] Yes Take 1 tablet (75 mg total) by mouth every morning. [name], MD Taking Active Self  Cyanocobalamin (VITAMIN B 12 PO) [id number] Yes Take 1 tablet by mouth daily. [provider] Taking Active Self  diclofenac sodium (VOLTAREN) 1 % GEL [id number] Yes Apply 4 g topically 4 (four) times daily.  Patient taking differently:  Apply 4 g topically as needed.    [name], MD Taking  Active Self  divalproex (DEPAKOTE) 125 MG DR tablet [id number] Yes Take 125 mg by mouth daily. [provider] Taking Active   docusate sodium (COLACE) 100 MG capsule [id number] No Take 100 mg by mouth 2 (two) times daily.  [provider] Not Taking Active Self  dorzolamide (TRUSOPT) 2 % ophthalmic solution [id number] Yes Place 1 drop into both eyes daily. Gwenyth Ober, MD Taking Active         Discontinued 05/06/18 1221 (Change in therapy)   Evolocumab (REPATHA  SURECLICK) 086 MG/ML SOAJ [id number] Yes Inject 140 mg into the skin every 14 (fourteen) days. [name], MD Taking Active Self           Med Note Nadara Eaton Apr 03, 2018  1:49 PM) Due this week  gabapentin (NEURONTIN) 100 MG capsule [id number] Yes Take 1 capsule (100 mg total) by mouth 3 (three) times daily.  Patient taking differently:  Take 200 mg by mouth 3 (three) times daily.    Meccariello, Bernita Raisin, DO Taking Active   Latanoprostene Bunod (VYZULTA OP) [id number] Yes Apply 1 drop to eye daily.  Moya, Carlisle Beers, MD Taking Active   metoprolol tartrate (LOPRESSOR) 25 MG tablet [id number] Yes Take 0.5 tablets (12.5 mg total) by mouth 2 (two) times daily. Lind Covert, MD Taking Active Self  mirtazapine (REMERON) 15 MG tablet [id number] Yes Take 15 mg by mouth at bedtime. [provider] Taking Active Self  Multiple Vitamin (MULTIVITAMIN WITH MINERALS) TABS tablet [id number] Yes Take 1 tablet by mouth daily. [provider] Taking Active Self  Omega-3 Fatty Acids (FISH OIL PO) [id number] Yes Take 1 tablet by mouth at bedtime. [provider] Taking Active   pantoprazole (PROTONIX) 20 MG tablet [id number] Yes Take 20 mg by mouth daily. [provider] Taking Active   polyethylene glycol Merril Abbe / GLYCOLAX) packet [id number] Yes Take 17 g by mouth daily as needed.  [provider] Taking Active Self           Med Note Corinna Lines May 06, 2018 12:15 PM) PRN   Med List Note Elayne Guerin, [facility] 04/03/18 1113): on          r Functional Status: In your present state of health, do you have any difficulty performing the following activities: 04/04/2018 09/03/2017  Hearing? N N  Vision? N N  Difficulty concentrating or making decisions? N N  Walking or climbing stairs? Y Y  Dressing or bathing? Y N  Comment weak unsteady gait -  Doing errands, shopping? Y -  Comment weak and unsteady -  Some recent data might be  hidden    Fall/Depression Screening: Fall Risk  01/02/2018 12/07/2017 11/28/2017  Falls in the past year? No No No  Comment - - -  Number falls in past yr: - - -  Injury with Fall? - - -  Follow up - - -   PHQ 2/9 Scores 03/27/2018 03/22/2018 01/02/2018 12/07/2017 11/08/2017 09/25/2017 08/10/2017  PHQ - 2 Score 6 6 0 0 5 5 1   PHQ- 9 Score 20 21 - - - 17 -      ASSESSMENT: Date Discharged from Hospital: 04/09/2018 Date Medication Reconciliation Performed: 05/06/2018  Medications Discontinued at Discharge: (Delete if not applicable)  BC headache powder  Nysatin Cream  Pantoprazole  Tramadol  Vyzulta  No new medications were prescribed at discharge.  Patient was recently discharged from hospital and all medications have  been reviewed   Drugs sorted by system:  Neurologic/Psychologic: Divalproex,  Citalopram, Gabapentin, Mirtazapine,  Cardiovascular: Amlodipine, Repatha, Fish Oil, Clopidogrel, metoprolol, Aspirin  Gastrointestinal: Pantoprazoele, Polyethylene Glycol, Docusate Sodium,   Topical: Diclofenac Gel 1%,   Pain: Acetaminophen, Tramadol,  Vitamins/Minerals: Multiple Vitamin, Cyanocobalamin,   Miscellaneous: Brimodine, Refresh Plus, Dorzolamide, Vyzulta  Medications to avoid in the elderly:  Gabapentin/tramadol combination-CNS depression, Increased risk of falls.  Dose Discrepancy Gabapentin- Med list and discharge summary states the Gabapentin dose is 100mg  three times daily. Patient reported taking 2 capsules three times daily.  Discontinued medications: Vytulza, Tramadol, and Pantoprazole-patient reported still taking all of these medications.  It is unclear if they were restarted while she was in the SNF.  Compliance Packing Patient said she is interested in pill packing but is not interested at this time because things are being changed so much. She has started getting her medications from [facility] who will deliver. [facility] will  provide pill packing when patient decides to start.   PLAN: Route note to PCP Call patient back in 10-14 days for follow up to see if she is ready to go into pill packs.   Elayne Guerin, PharmD, [place] Clinical Pharmacist [phone]

## 2018-05-06 NOTE — Telephone Encounter (Signed)
Mark Bias from [facility] is calling requesting home health physical therapy for this patient. 2x's week for 6 weeks starting today 05/06/18. Marks call back number is [phone]. It is okay to leave detailed VM.

## 2018-05-06 NOTE — Addendum Note (Signed)
Addended by: Deirdre Peer on: 05/06/2018 04:55 PM   Modules accepted: Orders

## 2018-05-06 NOTE — Patient Outreach (Signed)
[facility]) Care Management  05/06/2018  [name] [name] 1941/03/27 [id number]  CSW outreached pt today by phone. Identity confirmed and CSW introduced self and reason for call. Pt reports she has transferred home from SN[facility] rehab and that "[facility] getting [facility] for me". She reports her daughter, [name] [name], lives with her and that she has been able to get all her RX and needs met. She has transportation to appointments and denies any needs at this time. "I have an extra policy"- which sounds like is a Long term care insurance policy-  CSW explained to pt what this typically covers and encouraged her to call the company to inquire about her particular coverage/benefits.   This CSW was advised by [facility] CSW, Raynaldo Opitz, MSW, that pt had been referred to [facility] [employer] (APS). I have left a message for Harold Hedge, APS, [phone], for updates- will advise. CSW spoke with SNF rep who indicates APS Is aware of her dc to home.  CSW will also place referral for [facility] RNCM to outreach pt for post SNF TOC follow up.  CSW will plan a call to pt next week and update on APS once contact is made.   Eduard Clos, MSW, [place] Worker  [place] [phone]

## 2018-05-07 ENCOUNTER — Other Ambulatory Visit: Payer: Self-pay | Admitting: *Deleted

## 2018-05-07 ENCOUNTER — Telehealth: Payer: Self-pay

## 2018-05-07 NOTE — Telephone Encounter (Signed)
Izora Gala, OT from [facility], called for verbal orders:  [facility] OT 2x/week x 4 weeks for ADL training, strength, balance.  Call back is [phone]. May leave message.  Danley Danker, RN [facility] RN)

## 2018-05-07 NOTE — Patient Outreach (Signed)
[name]) Care Management  [place]  05/07/2018   [name] [name] 1941-02-07 [id number]    Transition of care call #1   Referral received : 10/29 Referral source: [facility] , LCSW Reason : Patient DC from [facility] care on 10/22  PMHx includes but not limited to , 04/03/18-10/1 admission at [facility] health for weakness, recent fall , confusion, homicidal and suicidal ideation, other history includes , GERD, PVD, HTN, ,OSA, polypharmacy, Anxiety , depression,   Successful outreach call to patient , HiPAA verified x 2 identifiers, explained reason for the call.  Patient discussed her recent hospital admission and rehab stay.   Social Patient lives at home with her daughter , [name] stating that she is available at home, but not able to do much.  Patient reports that she [facility] , health bath aide has visited on today and assisted with her bath. Patient reports that she uses her rollator in the home. Patient reports her daughter usually provides transportation for her , and should be able to drive her to medical appointments. Patient reports appetite being okay and having meals available.    Conditions  Patient discussed feeling weak but getting stronger after recent hospital stay. Patient reports fall prior to admission and it is taking her a while to get her strength back.  Patient discussed having swelling in lower legs, and continues to wear support hose . Patient denies suicidal thoughts, she discussed being eager to move around more and be on her own. Patient discussed history of depression hypertension that she reports she is managing .  Patient discussed being able to continue to working on her coloring books that she loves to work on. Patient discussed wanting to get involved in activities such as Bingo as she was doing in [place] prior to moving to [place].   Medications Patient medications have been reviewed [facility] pharmacist on 10/28 by Denyse Amass,  [facility]. Patient reports taking medications as prescribed.   Appointments  Patient discussed having several medical appointments coming up first one is at [facility] practice center on 11/6 that she plans to attend .   Advanced Directives Patient has health care power of attorney in place     Encounter Medications:  Outpatient Encounter Medications as of 05/07/2018  Medication Sig Note  . acetaminophen (TYLENOL) 500 MG tablet Take 1,000 mg by mouth every 6 (six) hours as needed for moderate pain or headache.   [name] amLODipine (NORVASC) 5 MG tablet TAKE 1 TABLET(5 MG) BY MOUTH DAILY (Patient taking differently: Take 5 mg by mouth daily. )   . aspirin EC 81 MG EC tablet Take 1 tablet (81 mg total) by mouth daily.   . brimonidine (ALPHAGAN) 0.15 % ophthalmic solution Place 1 drop into both eyes 2 (two) times daily.   . busPIRone (BUSPAR) 15 MG tablet Take 1 tablet (15 mg total) by mouth 3 (three) times daily. 04/03/2018: Ran out  . carboxymethylcellulose (REFRESH PLUS) 0.5 % SOLN 1 drop daily as needed.   . cholecalciferol (VITAMIN D) 1000 units tablet Take 1,000 Units by mouth daily.   . citalopram (CELEXA) 20 MG tablet Take 1 tablet (20 mg total) by mouth daily.   . clopidogrel (PLAVIX) 75 MG tablet Take 1 tablet (75 mg total) by mouth every morning.   . Cyanocobalamin (VITAMIN B 12 PO) Take 1 tablet by mouth daily.   . diclofenac sodium (VOLTAREN) 1 % GEL Apply 4 g topically 4 (four) times daily. (Patient taking  differently: Apply 4 g topically as needed. )   . divalproex (DEPAKOTE) 125 MG DR tablet Take 125 mg by mouth daily.   [name] docusate sodium (COLACE) 100 MG capsule Take 100 mg by mouth 2 (two) times daily.    . dorzolamide (TRUSOPT) 2 % ophthalmic solution Place 1 drop into both eyes daily.   . Evolocumab (REPATHA SURECLICK) 026 MG/ML SOAJ Inject 140 mg into the skin every 14 (fourteen) days. 04/03/2018: Due this week  . gabapentin (NEURONTIN) 100 MG capsule Take 1 capsule (100 mg total) by mouth  3 (three) times daily. (Patient taking differently: Take 200 mg by mouth 3 (three) times daily. )   . Latanoprostene Bunod (VYZULTA OP) Apply 1 drop to eye daily.    . metoprolol tartrate (LOPRESSOR) 25 MG tablet Take 0.5 tablets (12.5 mg total) by mouth 2 (two) times daily.   . mirtazapine (REMERON) 15 MG tablet Take 15 mg by mouth at bedtime.   . Multiple Vitamin (MULTIVITAMIN WITH MINERALS) TABS tablet Take 1 tablet by mouth daily.   . Omega-3 Fatty Acids (FISH OIL PO) Take 1 tablet by mouth at bedtime.   . pantoprazole (PROTONIX) 20 MG tablet Take 20 mg by mouth daily.   . polyethylene glycol (MIRALAX / GLYCOLAX) packet Take 17 g by mouth daily as needed.  05/06/2018: PRN    No facility-administered encounter medications on file as of 05/07/2018.       Fall/Depression Screening: Fall Risk  05/07/2018 01/02/2018 12/07/2017  Falls in the past year? Yes No No  Comment - - -  Number falls in past yr: 1 - -  Injury with Fall? Yes - -  Risk Factor Category  High Fall Risk - -  Risk for fall due to : History of fall(s) - -  Follow up Falls prevention discussed - -   PHQ 2/9 Scores 05/07/2018 03/27/2018 03/22/2018 01/02/2018 12/07/2017 11/08/2017 09/25/2017  PHQ - 2 Score 1 6 6  0 0 5 5  PHQ- 9 Score - 20 21 - - - 17    Assessment:  Patient agreeable to [facility] care management services. Weekly transition of care outreaches have been explained.    Plan:  Will plan follow up call for transition of care in the next week.  Will send PCP involvement, will send patient welcome letter  Provided patient with [facility] care coordinator contact information.  Encouraged patient to contact us if issue arises regarding  transportation    [facility] CM Care Plan Problem One     Most Recent Value  Care Plan Problem One  High risk for readmission related to recent discharge from SNF after hospitalizaiton for weakness, fall ,depression  Role Documenting the Problem One  Care Management [place] for  Problem One  Active  [facility] Long Term Goal   Patient will not experience a hospital readmission in the next 31 days   THN Long Term Goal Start Date  05/07/18  Interventions for Problem One Long Term Goal  Discussed current condition , advised regarding notifying MD of new concerns , recurrent falls . Encouraged taking medications as prescribed.   THN CM Short Term Goal #1   Patient will attend all medical appointments over the next 30 days   THN CM Short Term Goal #1 Start Date  05/07/18  Interventions for Short Term Goal #1  Discussed upcoming PCP visit in the next week, verified transportation to visit, advised to patient to notiify care manager is new needs arise. Advised  regarding importance of attending all medical appointments.   THN CM Short Term Goal #2   Over the next 30 days patient will report actively participatiing in home health services as ordered   [facility] CM Short Term Goal #2 Start Date  05/07/18  Interventions for Short Term Goal #2  confirmed home health services contact and visits scheduled , encouraged particiipatiion in therapy sessions .       Joylene Draft, RN, [place] Management Coordinator  [phone]- Mobile [phone]- Toll Free Main Office

## 2018-05-08 NOTE — Telephone Encounter (Signed)
Left voice mail with verbal orders for home health PT for 2x per week for 6 weeks.  [name] MD PGY-2 Family Medicine Resident

## 2018-05-08 NOTE — Telephone Encounter (Signed)
[name] [name] forgot to ask for verbal order for MSW consultation. Patient has specific questions regarding long term care among others.  Call back is [phone].  Danley Danker, RN [facility] RN)

## 2018-05-08 NOTE — Telephone Encounter (Signed)
Gave verbal orders for [facility] OT 2x/week for ADL, strength and balance training  [name] MD PGY-2 Family Medicine Resident

## 2018-05-09 ENCOUNTER — Other Ambulatory Visit: Payer: Self-pay | Admitting: *Deleted

## 2018-05-09 NOTE — Telephone Encounter (Signed)
Gave verbal orders for social work consultation.  [name] MD PGY-2 Family Medicine Resident

## 2018-05-09 NOTE — Patient Outreach (Signed)
[facility] [facility]) Care Management  05/09/2018  [name] [name] 03-06-1941 [id number]   CSW spoke with Harold Hedge, Adult Protective Services who reports the patient's case was closed. He also reports they are aware she was released from SNF back to home; he thought the plan was for long term ALF   CSW has also spoken to pt by phone and visit planned with Landis Martins, [facility], for next Monday.       Eduard Clos, MSW, [place] Worker  [place] [phone]

## 2018-05-13 ENCOUNTER — Encounter: Payer: Self-pay | Admitting: *Deleted

## 2018-05-13 ENCOUNTER — Other Ambulatory Visit: Payer: Self-pay | Admitting: *Deleted

## 2018-05-13 NOTE — Patient Outreach (Signed)
[facility] [facility]) Care Management   05/13/2018  [name] [name] Feb 20, 1941 [id number]  [name] [name] is an 77 y.o. female   PMHx includes but not limited to , 04/03/18-10/1 admission at [facility] health for weakness, recent fall , confusion, homicidal and suicidal ideation,  AKI , other history includes , GERD, PVD, HTN, ,OSA, polypharmacy, Anxiety , depression,   Subjective:   Patient discussed feeling tired  still but getting some of her strength back, states she know it will take a little time .    Patient states they finally got my medications straightened out , getting them for [facility] pharmacy. Patient states that she has not decided about pill packaging.   Objective:  BP 120/70 (BP Location: Right Arm, Patient Position: Sitting, Cuff Size: Normal)   Pulse 65   Resp 18   Ht 1.511 m (4' 11.5")   Wt 136 lb (61.7 kg)   SpO2 97%   BMI 27.01 kg/m  Review of Systems  Constitutional: Negative.   HENT: Negative.   Eyes:       Glaucoma  Respiratory: Negative.   Cardiovascular: Positive for leg swelling.  Gastrointestinal: Negative.   Genitourinary: Negative.   Musculoskeletal: Negative.   Skin: Negative.   Neurological: Positive for dizziness.  Endo/Heme/Allergies: Bruises/bleeds easily.  Psychiatric/Behavioral: Positive for depression. Negative for suicidal ideas.    Physical Exam  Constitutional: She is oriented to person, place, and time. She appears well-developed and well-nourished.  Cardiovascular: Normal rate and normal heart sounds.  Respiratory: Effort normal and breath sounds normal.  GI: Soft. Bowel sounds are normal.  Neurological: She is alert and oriented to person, place, and time.  Skin: Skin is warm and dry.  Generalized discoloration on arms , legs   Psychiatric: She has a normal mood and affect. Her behavior is normal. Judgment and thought content normal.    Encounter Medications:   Outpatient Encounter Medications as of 05/13/2018  Medication Sig  Note  . acetaminophen (TYLENOL) 500 MG tablet Take 1,000 mg by mouth every 6 (six) hours as needed for moderate pain or headache.   [name] amLODipine (NORVASC) 5 MG tablet TAKE 1 TABLET(5 MG) BY MOUTH DAILY (Patient taking differently: Take 5 mg by mouth daily. )   . aspirin EC 81 MG EC tablet Take 1 tablet (81 mg total) by mouth daily.   . brimonidine (ALPHAGAN) 0.15 % ophthalmic solution Place 1 drop into both eyes 2 (two) times daily.   . busPIRone (BUSPAR) 15 MG tablet Take 1 tablet (15 mg total) by mouth 3 (three) times daily.   . carboxymethylcellulose (REFRESH PLUS) 0.5 % SOLN 1 drop daily as needed.   . cholecalciferol (VITAMIN D) 1000 units tablet Take 1,000 Units by mouth daily.   . citalopram (CELEXA) 20 MG tablet Take 1 tablet (20 mg total) by mouth daily.   . clopidogrel (PLAVIX) 75 MG tablet Take 1 tablet (75 mg total) by mouth every morning.   . Cyanocobalamin (VITAMIN B 12 PO) Take 1 tablet by mouth daily.   . diclofenac sodium (VOLTAREN) 1 % GEL Apply 4 g topically 4 (four) times daily. (Patient taking differently: Apply 4 g topically as needed. )   . divalproex (DEPAKOTE) 125 MG DR tablet Take 125 mg by mouth daily.   [name] docusate sodium (COLACE) 100 MG capsule Take 100 mg by mouth 2 (two) times daily.    . dorzolamide (TRUSOPT) 2 % ophthalmic solution Place 1 drop into both eyes daily.   [name]  Evolocumab (REPATHA SURECLICK) 916 MG/ML SOAJ Inject 140 mg into the skin every 14 (fourteen) days. 04/03/2018: Due this week  . gabapentin (NEURONTIN) 100 MG capsule Take 1 capsule (100 mg total) by mouth 3 (three) times daily. (Patient taking differently: Take 200 mg by mouth 3 (three) times daily. )   . Latanoprostene Bunod (VYZULTA OP) Apply 1 drop to eye daily.    . metoprolol tartrate (LOPRESSOR) 25 MG tablet Take 0.5 tablets (12.5 mg total) by mouth 2 (two) times daily.   . mirtazapine (REMERON) 15 MG tablet Take 15 mg by mouth at bedtime.   . Multiple Vitamin (MULTIVITAMIN WITH MINERALS) TABS  tablet Take 1 tablet by mouth daily.   . Omega-3 Fatty Acids (FISH OIL PO) Take 1 tablet by mouth at bedtime.   . pantoprazole (PROTONIX) 20 MG tablet Take 20 mg by mouth daily.   . polyethylene glycol (MIRALAX / GLYCOLAX) packet Take 17 g by mouth daily as needed.  05/06/2018: PRN    No facility-administered encounter medications on file as of 05/13/2018.     Functional Status:   In your present state of health, do you have any difficulty performing the following activities: 05/13/2018 04/04/2018  Hearing? N N  Vision? Y N  Difficulty concentrating or making decisions? N N  Walking or climbing stairs? Y Y  Comment using walker,  -  Dressing or bathing? [name] [name]  Comment has home health bath aide  weak unsteady gait  Doing errands, shopping? [name] [name]  Comment daughter asssit  weak and unsteady  Conservation officer, nature and eating ? Y -  Using the Toilet? N -  In the past six months, have you accidently leaked urine? Y -  Comment wears depends -  Do you have problems with loss of bowel control? N -  Managing your Medications? Y -  Managing your Finances? N -  Housekeeping or managing your Housekeeping? Y -  Some recent data might be hidden    Fall/Depression Screening:    Fall Risk  05/07/2018 01/02/2018 12/07/2017  Falls in the past year? Yes No No  Comment - - -  Number falls in past yr: 1 - -  Injury with Fall? Yes - -  Risk Factor Category  High Fall Risk - -  Risk for fall due to : History of fall(s) - -  Follow up Falls prevention discussed - -   PHQ 2/9 Scores 05/07/2018 03/27/2018 03/22/2018 01/02/2018 12/07/2017 11/08/2017 09/25/2017  PHQ - 2 Score 1 6 6  0 0 5 5  PHQ- 9 Score - 20 21 - - - 17    Assessment:  Initial home visit    Safety concerns.   [facility] is [facility] by [facility] , health PT , OT , RN , aide and Education officer, museum. Home safety evaluation completed and scatter rugs that are not nonskid have been removed. Patient using rollator walker during visit . Reports new physical  therapy exercises provided on today.   Blood pressure- Patient monitoring daily, denies dizziness or lightheadedness.Patient tolerating diet well per report. Patient reports that she always tries to  watch the  salt in her diet.  Psychological issues/Social  Denies suicidal thoughts. Patient discussed trying to stay busy to keep her mind occupied reports that helps her. Has counselor in [place] wants to find psychiatrist , counselor locally and participate in group therapy.  Patient wants to be involved in local senior activities during the day. Patient living in apartment with her daughter. Patient  interested in moving into her own place. Patient discussed having long term care insurance that she is checking on benefits of what that covers.   Advanced directives Wants to make changes in current Advanced Directive , HCPOA  Appointments Has upcoming PCP visit 11/6, and [facility] for follow up on glaucoma. Patient reports that her daughter will provide transportation.    Plan:  Reviewed [facility] welcome packet  Provided EMMI on preventing falls in the elderly Will in basket Eduard Clos, LCSW with patient concerns at visit .  Provided Advanced directive packet with review of how to complete and local resources for getting notarized.  Provided EMMI on depression and chronic medical conditions.  Will send PCP this initial home visit note.  Will plan return call in the next week as part of transition of care .    THN CM Care Plan Problem One     Most Recent Value  Care Plan Problem One  High risk for readmission related to recent discharge from SNF after hospitalizaiton for weakness, fall ,depression  Role Documenting the Problem One  Care Management [place] for Problem One  Active  THN Long Term Goal   Patient will not experience a hospital readmission in the next 31 days   THN Long Term Goal Start Date  05/07/18  Interventions for Problem One Long Term Goal  Home visit  completed, advised continuing medications as prescribed, provided fall precaution EMMI handout and encouraged to review again. ,reinforced standing a few seconds before starting to walk , reviewed to notify MD of symptoms of worsening dizziness .   THN CM Short Term Goal #1   Patient will attend all medical appointments over the next 30 days   THN CM Short Term Goal #1 Start Date  05/07/18  Interventions for Short Term Goal #1  Reviewed upcoming visit with PCP, eye doctor.   THN CM Short Term Goal #2   Over the next 30 days patient will report actively participatiing in home health services as ordered   [facility] CM Short Term Goal #2 Start Date  05/07/18  Interventions for Short Term Goal #2  Discussed PT exercises , and encouraged to participate in exercises education on , reviewed the benefits of continuing excercises along with home therapy visits to increase strenght .   THN CM Short Term Goal #3  Patient will continue to montior blood pressure daily over the next 30 days   THN CM Short Term Goal #3 Start Date  05/13/18  Interventions for Short Tern Goal #3  Discussed having appropriate montior,encouraged keeping a record of reading and take to next PCP visit. Provided THN blood pressure book.       Joylene Draft, RN, [place] Management Coordinator  [phone]- Mobile [phone]- Toll Free Main Office

## 2018-05-14 ENCOUNTER — Telehealth: Payer: Self-pay | Admitting: Family Medicine

## 2018-05-14 ENCOUNTER — Ambulatory Visit: Payer: Self-pay | Admitting: *Deleted

## 2018-05-14 DIAGNOSIS — J189 Pneumonia, unspecified organism: Secondary | ICD-10-CM | POA: Diagnosis not present

## 2018-05-14 DIAGNOSIS — R509 Fever, unspecified: Secondary | ICD-10-CM | POA: Diagnosis not present

## 2018-05-14 NOTE — Telephone Encounter (Signed)
Attempted to contact pt to remind them of their appt tomorrow on 05/15/2018. -[place]

## 2018-05-14 NOTE — Patient Outreach (Signed)
[place] [name]) Care Management  05/14/2018  AYSHA LIVECCHI [birth year] [id number]   Late Entry  CSW contacted pt on 05/13/2018 and confirmed plans for home visit with [place]. CSW was unable to attend and plans to call pt to reschedule initial home visit.   Eduard Clos, MSW, [place] Worker  [place] [phone]

## 2018-05-15 ENCOUNTER — Ambulatory Visit: Payer: Self-pay | Admitting: *Deleted

## 2018-05-15 ENCOUNTER — Other Ambulatory Visit: Payer: Self-pay | Admitting: *Deleted

## 2018-05-15 ENCOUNTER — Ambulatory Visit: Payer: Self-pay | Admitting: Family Medicine

## 2018-05-15 NOTE — Patient Outreach (Signed)
[facility]) Care Management  05/15/2018  CALLEE ROHRIG 16-Apr-1941 [id number]   Telephone assessment  Received voicemail message from patient, returned call to patient, HIPAA verified.   Patient discussed being sick on yesterday, feeling shaky had temperature of 102, and she went to urgent care in [place], patient discussed being diagnosed with pneumonia. Patient reports receiving IM injection of antibiotic and another medication.  Patient discussed she has filled prescriptions for another antibiotic and taking as prescription she is unable to state name of medication, resting in bed at present .  Patient discussed feeling weak, she states home health came today and she did not let them in because she had a fever and didn't feel like working with them today.  Patient reports decreased appetite , reports that she is drinking fluids well, no nausea. Patient reports temperature down to 100- 101 on today and taking tylenol every 6 hours, states that she has been instructed to return to ED for continued temperature elevation, unresolved symptoms, continued weakness  , she denies increased shortness of breath, cough  .   Patient states she will not be able to keep her appointment with Dr.Fletcher on today , requested assistance with cancelling and rescheduling for the next week.  Patient discussed plans to transition to doctor in [place] doctor after next appointment in [place].  Placed call to [facility] center, spoke with representative to explain patient , visit to urgent care on yesterday and Dx of Pneumonia and request to reschedule visit for next week does not feel like travel to appointment on today.    Plan  Advised patient regarding seeking medical attention for continued temperature elevation greater than 101, cough , weakness , shortness of breath, Reinforced completing all antibiotics.  Will plan return call in the next 3 business days.  Will send PCP office  this telephone visit note.    Joylene Draft, RN, [place] Management Coordinator  [phone]- Mobile [phone]- Toll Free Main Office

## 2018-05-16 NOTE — Addendum Note (Signed)
Addended by: Deirdre Peer on: 05/16/2018 07:47 AM   Modules accepted: Orders

## 2018-05-17 ENCOUNTER — Other Ambulatory Visit: Payer: Self-pay

## 2018-05-17 ENCOUNTER — Other Ambulatory Visit: Payer: Self-pay | Admitting: *Deleted

## 2018-05-17 NOTE — Patient Outreach (Signed)
[facility]) Care Management  05/17/2018  [name] [name] 1940-12-09 [id number]   Telephone follow up call    Successful outreach call to patient ,she discussed feeling much better, reports temperature has not been over 99.6. Patient states that she continues to take tylenol as needed. She is tolerating diet, and mobility around home , home health PT visit on this am.   Patient discussed that she has confirmed her appointment at [facility] practice for 11/12 at 2:30 pm., patient states that she may need a ride because her daughter is sick now and she doesn't have anyone else ans she is not able to drive.   Plan  Will discuss with [facility] social worker  Will plan transition of care call in next week.    Joylene Draft, RN, [place] Management Coordinator  [phone]- Mobile [phone]- Toll Free Main Office

## 2018-05-17 NOTE — Patient Outreach (Signed)
[place] [facility]) Care Management  05/17/2018  [name] [name] [birth year] [id number]   Request received from [place], Eduard Clos, to arrange transportation to appointment with Dr. Madison Hickman on 05/21/18 @ 2:30 PM.  Transportation arranged via [place], [place] Worker [phone]

## 2018-05-20 ENCOUNTER — Other Ambulatory Visit: Payer: Self-pay | Admitting: *Deleted

## 2018-05-20 ENCOUNTER — Other Ambulatory Visit: Payer: Self-pay

## 2018-05-20 NOTE — Patient Outreach (Signed)
[place] [facility]) Care Management  05/20/2018  [name] [name] Apr 05, 1941 [id number]  Successful outreach to the patient to remind of arranged transportation for appointment on 05/21/18.  Daneen Schick, BSW, CDP [facility] [phone]

## 2018-05-20 NOTE — Patient Outreach (Signed)
[facility]) Care Management  05/20/2018  [name] [name] [birth year] [id number]   Transition of care call    Successful outreach call patient , reports that she is feeling a lot better. Patient denies having a recent elevated temperature for the last 2 days.  Patient reports still having a cough with dark colored sputum, but states at least it is moving out.  Patient reports she has about 1 day left on her antibiotics before completion.   Patient reports being able to participate with home health therapy on today, but her legs hurt worse on days when it is cold but this has been an ongoing problem.   Patient discussed tolerating drinking fluids and appetite if still a little slow. Patient discussed transportation to visit at [facility] on tomorrow reminded her to the transportation services name, encouraged her to keep her phone nearby in case they have difficulty locating her home.    Patient denies any new concerns at this time.   Plan Will continue weekly transition of care outreaches, next call in a week.     [facility] CM Care Plan Problem One     Most Recent Value  Care Plan Problem One  High risk for readmission related to recent discharge from SNF after hospitalizaiton for weakness, fall ,depression  Role Documenting the Problem One  Care Management [place] for Problem One  Active  THN Long Term Goal   Patient will not experience a hospital readmission in the next 31 days   THN Long Term Goal Start Date  05/07/18  Interventions for Problem One Long Term Goal  Review of clinical state, reviewed signs of infection, fever, increased cough and production discolored sputum to notify MD of .   [facility] CM Short Term Goal #1   Patient will attend all medical appointments over the next 30 days   [facility] CM Short Term Goal #1 Start Date  05/07/18  Interventions for Short Term Goal #1  Reviewed PCP visit on tomorrow , discussed transportation , educated on taking  all medications for review at visit.   [facility] CM Short Term Goal #2   Over the next 30 days patient will report actively participatiing in home health services as ordered   [facility] CM Short Term Goal #2 Start Date  05/07/18  Interventions for Short Term Goal #2  Reviewed progresss with therapy and continue to adhere to exercise recomendations and safety precautions reviewed., using rolling walker at all times   [facility]N CM Short Term Goal #3  Patient will continue to montior blood pressure daily over the next 30 days   [facility] CM Short Term Goal #3 Start Date  05/13/18  Interventions for Short Tern Goal #3  Encouraged continued monitoring of blood pressure , review of recent readings, encouraged to take record to PCP visit   [facility]N CM Short Term Goal #4  Patient will be able to identify at least 2 strategies for depression managment over the next 30 days as evidenced by report.   [facility] CM Short Term Goal #4 Start Date  05/13/18  Interventions for Short Term Goal #4  Encouraged continuing to participate in actions to help with coping.       Joylene Draft, RN, [place] Management Coordinator  [phone]- Mobile [phone]- Toll Free Main Office

## 2018-05-21 ENCOUNTER — Encounter: Payer: Self-pay | Admitting: Family Medicine

## 2018-05-21 ENCOUNTER — Ambulatory Visit (INDEPENDENT_AMBULATORY_CARE_PROVIDER_SITE_OTHER): Payer: PPO | Admitting: Family Medicine

## 2018-05-21 ENCOUNTER — Other Ambulatory Visit: Payer: Self-pay

## 2018-05-21 DIAGNOSIS — R531 Weakness: Secondary | ICD-10-CM | POA: Diagnosis not present

## 2018-05-21 DIAGNOSIS — M5416 Radiculopathy, lumbar region: Secondary | ICD-10-CM

## 2018-05-21 DIAGNOSIS — F332 Major depressive disorder, recurrent severe without psychotic features: Secondary | ICD-10-CM

## 2018-05-21 DIAGNOSIS — E7800 Pure hypercholesterolemia, unspecified: Secondary | ICD-10-CM

## 2018-05-21 DIAGNOSIS — I739 Peripheral vascular disease, unspecified: Secondary | ICD-10-CM | POA: Diagnosis not present

## 2018-05-21 MED ORDER — TRAMADOL HCL 50 MG PO TABS: 50.0000 mg | ORAL_TABLET | Freq: Two times a day (BID) | ORAL | 2 refills | Status: DC | PRN

## 2018-05-21 NOTE — Patient Instructions (Signed)
I am very impressed with how far you have come in one month.   I will call with the blood test results and send to cardiologist.   See Dr. Kris Mouton in one month.

## 2018-05-22 ENCOUNTER — Encounter: Payer: Self-pay | Admitting: Family Medicine

## 2018-05-22 DIAGNOSIS — H5359 Other color vision deficiencies: Secondary | ICD-10-CM | POA: Diagnosis not present

## 2018-05-22 DIAGNOSIS — H21562 Pupillary abnormality, left eye: Secondary | ICD-10-CM | POA: Diagnosis not present

## 2018-05-22 DIAGNOSIS — H53452 Other localized visual field defect, left eye: Secondary | ICD-10-CM | POA: Diagnosis not present

## 2018-05-22 DIAGNOSIS — H401133 Primary open-angle glaucoma, bilateral, severe stage: Secondary | ICD-10-CM | POA: Diagnosis not present

## 2018-05-22 LAB — LIPID PANEL
Chol/HDL Ratio: 1.9 ratio (ref 0.0–4.4)
Cholesterol, Total: 134 mg/dL (ref 100–199)
HDL: 69 mg/dL (ref >39)
LDL Calculated: 44 mg/dL (ref 0–99)
Triglycerides: 107 mg/dL (ref 0–149)
VLDL Cholesterol Cal: 21 mg/dL (ref 5–40)

## 2018-05-22 NOTE — Assessment & Plan Note (Signed)
MUCH improved.  Patient on meds and in therapy.  Her home situation has improved nicely.

## 2018-05-22 NOTE — Progress Notes (Signed)
   Subjective:    Patient ID: [name] [name], female    DOB: 04-17-1941, 77 y.o.   MRN: [id number]  HPI FU hospitalization followed by SNF for rehab.   77 yo female hospitalizaed on 9/25 for suicidal ideations and AKI.  As best I can tell from notes and patient history: 1. SI and HI.  Hx of major recurrent depressive disorder.  Severe.  No psychotic features.  Much improved.  Followed by [facility].  Trigger was apparently daughter in the home (abusing drug?).  Now daughter is "getting the hep she needs."  They continue to live together and help one another out.  No current HI or SI 2. AKI had resolved in the hospital.  Admission wt quite low.  I assume dehydration was a major contributing factor.  3. Hyperlipidemia.  "cannot take statins."  Followed in lipid clinic and on SK9 inhibitor.  Needs lipid panel.  She is fasting today. 4. Generalized weakness.  Much improved with SNF and continues to get PT at home.  She has also benefited greatly from low dose gabapentin, which she did not think that she could take.    Review of Systems     Objective:   Physical Exam VS noted.  Given polypharm and risk of overtreatment, I am OK with Systolic<150/ Lungs clear Cardiac RRR without m or g Ext trace edema.        Assessment & Plan:

## 2018-05-22 NOTE — Assessment & Plan Note (Signed)
Makes cholesterol control vital.  Asymptomatic now.  Check lipid panel.

## 2018-05-22 NOTE — Assessment & Plan Note (Signed)
Much improved

## 2018-05-22 NOTE — Assessment & Plan Note (Signed)
>>  ASSESSMENT AND PLAN FOR GENERALIZED WEAKNESS WRITTEN ON 05/22/2018 11:34 AM BY HENSEL, Azucena Bollard, MD  Much improved.

## 2018-05-22 NOTE — Assessment & Plan Note (Signed)
This has been helped greatly by resuming low dose gabapentin.

## 2018-05-23 ENCOUNTER — Other Ambulatory Visit: Payer: Self-pay | Admitting: Pharmacist

## 2018-05-23 NOTE — Patient Outreach (Signed)
[facility]) Care Management  05/23/2018  [name] [name] 11-20-1940 [id number]   Patient was called to follow up on pill packs. HIPAA identifiers were obtained. Just after establishing HIPAA, patient said her therapist was in her home and requested I call her back.  Called patient back this afternoon. Unfortunately, she did not answer her phone and a recording came on (after >5 rings) that said she had not set up her voicemail.  Plan: Call patient back in 5-7 business days.   Elayne Guerin, PharmD, [place] Clinical Pharmacist [phone]

## 2018-05-26 NOTE — Progress Notes (Deleted)
Cardiology Office Note   Date:  05/26/2018   ID:  [name] [name], DOB [birth year], MRN [id number]  PCP:  [name], MD  Cardiologist:   Jenkins Rouge, MD / Quay Burow  No chief complaint on file.     History of Present Illness: [name] [name] is a 77 y.o. female who presents for f/u of her HTN and PVD. Primarily seen by Dr Gwenlyn Found.  Previous left iliac artery stenting in July 2017 with 60% residual left mid SFA disease ABI"s done 12/18/16 where Normal at 1.1 bilaterally. No history of CAD EF 55-60% by echo 08/25/16 no significant valve disease Carotids With 46-50% LICA stenosis duplex 01/07/16 Has not had recent stress testing Quit smoking in 2012 She is Intolerant to statins and was just started on [place] for suicidal ideations and pre renal azotemia followed by [facility] mental health Seen by Dr Gwenlyn Found June 2019 stable no claudication but did not f/u for carotid duplex  ***   Past Medical History:  Diagnosis Date  . Anxiety   . Arthritis    RA  . Carpal tunnel syndrome, bilateral   . Chronic kidney disease    CKD stage III, absence of left kidney  . Congenital absence of one kidney    Pt has right kidney only  . Depression   . GERD (gastroesophageal reflux disease)   . Headache   . Heart murmur   . Hypercholesteremia   . Hypertension   . Peripheral vascular disease ([place])    a. s/p R external iliac stent '06 with angioplasty '13 (Dr. Benjamine Sprague) b. 01/2016: s/p PTA/stenting in L common iliac artery (Dr. Gwenlyn Found)  . Polymyalgia rheumatica ([place])   . Psoriasis     Past Surgical History:  Procedure Laterality Date  . ABDOMINAL HYSTERECTOMY  1963   Partial,  Due to bleeding after delivery  . ANTERIOR CERVICAL DECOMP/DISCECTOMY FUSION  04/19/2012   Procedure: ANTERIOR CERVICAL DECOMPRESSION/DISCECTOMY FUSION 2 LEVELS;  Surgeon: Winfield Cunas, MD;  Location: [place] [facility];  Service: Neurosurgery;  Laterality: N/A;  Cervical four-five,Cervical five-six  anterior cervical decompression with fusion plating and bonegraft possible posterior cervical decompression  . APPENDECTOMY    . CARPAL TUNNEL RELEASE  1990  . CERVICAL FUSION  04/19/2012  . EYE SURGERY    . ILIAC ARTERY STENT    . PERIPHERAL VASCULAR CATHETERIZATION N/A 01/31/2016   Procedure: Abdominal Aortogram;  Surgeon: [name], MD;  Location: [place] [facility];  Service: Cardiovascular;  Laterality: N/A;  . PERIPHERAL VASCULAR CATHETERIZATION Bilateral 01/31/2016   Procedure: Lower Extremity Angiography;  Surgeon: [name], MD;  Location: [place] [facility];  Service: Cardiovascular;  Laterality: Bilateral;  . PERIPHERAL VASCULAR CATHETERIZATION Left 01/31/2016   Procedure: Peripheral Vascular Intervention;  Surgeon: [name], MD;  Location: [place] [facility];  Service: Cardiovascular;  Laterality: Left;  ILIAC  . TONSILLECTOMY       Current Outpatient Medications  Medication Sig Dispense Refill  . acetaminophen (TYLENOL) 500 MG tablet Take 1,000 mg by mouth every 6 (six) hours as needed for moderate pain or headache.    [name] amLODipine (NORVASC) 5 MG tablet TAKE 1 TABLET(5 MG) BY MOUTH DAILY (Patient taking differently: Take 5 mg by mouth daily. ) 90 tablet 0  . aspirin EC 81 MG EC tablet Take 1 tablet (81 mg total) by mouth daily.    . brimonidine (ALPHAGAN) 0.15 % ophthalmic solution Place 1 drop into both  eyes 2 (two) times daily.    . busPIRone (BUSPAR) 15 MG tablet Take 1 tablet (15 mg total) by mouth 3 (three) times daily. 90 tablet 0  . carboxymethylcellulose (REFRESH PLUS) 0.5 % SOLN 1 drop daily as needed.    . cholecalciferol (VITAMIN D) 1000 units tablet Take 1,000 Units by mouth daily.    . citalopram (CELEXA) 20 MG tablet Take 1 tablet (20 mg total) by mouth daily. 90 tablet 0  . clopidogrel (PLAVIX) 75 MG tablet Take 1 tablet (75 mg total) by mouth every morning. 90 tablet 3  . Cyanocobalamin (VITAMIN B 12 PO) Take 1 tablet by mouth daily.    .  diclofenac sodium (VOLTAREN) 1 % GEL Apply 4 g topically 4 (four) times daily. (Patient taking differently: Apply 4 g topically as needed. ) 100 g 0  . divalproex (DEPAKOTE) 125 MG DR tablet Take 125 mg by mouth daily.    [name] docusate sodium (COLACE) 100 MG capsule Take 100 mg by mouth 2 (two) times daily.     . dorzolamide (TRUSOPT) 2 % ophthalmic solution Place 1 drop into both eyes daily.    . Evolocumab (REPATHA SURECLICK) 009 MG/ML SOAJ Inject 140 mg into the skin every 14 (fourteen) days. 2 pen 12  . gabapentin (NEURONTIN) 100 MG capsule Take 1 capsule (100 mg total) by mouth 3 (three) times daily. (Patient taking differently: Take 200 mg by mouth 3 (three) times daily. ) 90 capsule 0  . Latanoprostene Bunod (VYZULTA OP) Apply 1 drop to eye daily.     . metoprolol tartrate (LOPRESSOR) 25 MG tablet Take 0.5 tablets (12.5 mg total) by mouth 2 (two) times daily. 30 tablet 11  . mirtazapine (REMERON) 15 MG tablet Take 15 mg by mouth at bedtime.    . pantoprazole (PROTONIX) 20 MG tablet Take 20 mg by mouth daily.    . traMADol (ULTRAM) 50 MG tablet Take 1 tablet (50 mg total) by mouth every 12 (twelve) hours as needed. 60 tabs must last one month. 60 tablet 2   No current facility-administered medications for this visit.     Allergies:   Gabapentin and Statins    Social History:  The patient  reports that she quit smoking about 7 years ago. Her smoking use included cigarettes. She has a 40.00 pack-year smoking history. She has never used smokeless tobacco. She reports that she does not drink alcohol or use drugs.   Family History:  The patient's family history includes Angina in her mother; Cancer in her sister; Heart attack in her father; Melanoma in her brother.    ROS:  Please see the history of present illness.   Otherwise, review of systems are positive for none.   All other systems are reviewed and negative.    PHYSICAL EXAM: VS:  There were no vitals taken for this visit. , BMI There  is no height or weight on file to calculate BMI. Affect appropriate Chronically ill female  HEENT: normal Neck supple with no adenopathy JVP normal no bruits no thyromegaly Lungs clear with no wheezing and good diaphragmatic motion Heart:  S1/S2 no murmur, no rub, gallop or click PMI normal Abdomen: benighn, BS positve, no tenderness, no AAA Bilateral femoral bruit.  No HSM or HJR Distal pulses intact with no bruits No edema Neuro non-focal Skin chronic stasis and dry  No muscular weakness    EKG:  NSR PAC otherwise normal 09/20/16    Recent Labs: 11/28/2017: TSH 1.960 04/03/2018: ALT  12 04/08/2018: BUN 15; Creatinine, Ser 0.93; Hemoglobin 10.8; Platelets 160; Potassium 4.2; Sodium 141    Lipid Panel    Component Value Date/Time   CHOL 134 05/21/2018 1519   TRIG 107 05/21/2018 1519   HDL 69 05/21/2018 1519   CHOLHDL 1.9 05/21/2018 1519   CHOLHDL 4.6 07/31/2016 1046   VLDL 32 (H) 07/31/2016 1046   LDLCALC 44 05/21/2018 1519      Wt Readings from Last 3 Encounters:  05/21/18 150 lb (68 kg)  05/13/18 136 lb (61.7 kg)  04/04/18 142 lb 3.1 oz (64.5 kg)      Other studies Reviewed: Additional studies/ records that were reviewed today include: Notes PV Dr Gwenlyn Found ABI's, carotid duplex LE angiogram  And stent intervention .    ASSESSMENT AND PLAN:  1.  PVD ABI's normal  10/10/17 stable pain in legs from arthritis f/u Dr Gwenlyn Found Previous stenting of left common and external iliac 2017 2. Bruit bilateral bruits 92-11% LICA 9/41/74  f/u duplex ordered by Dr Gwenlyn Found in June but not done will reorder  3. HTN  Well controlled.  Continue current medications and low sodium Dash type diet.   4. Cholesterol f/u lipid clinic started on repatha LDL 05/21/18 markedly improved from 190->128->44 4. Depression:  On Celexa f/u [facility]     Current medicines are reviewed at length with the patient today.  The patient does not have concerns regarding medicines.  The following  changes have been made:  no change  Labs/ tests ordered today include: none  No orders of the defined types were placed in this encounter.    Disposition:   FU with Dr Gwenlyn Found as her primary issues are vascular      Signed, Jenkins Rouge, MD  05/26/2018 2:58 PM    [address], [place], [place]  [address] Phone: [phone]; Fax: [phone]

## 2018-05-27 ENCOUNTER — Other Ambulatory Visit: Payer: Self-pay | Admitting: *Deleted

## 2018-05-27 ENCOUNTER — Other Ambulatory Visit: Payer: Self-pay

## 2018-05-27 MED ORDER — BUSPIRONE HCL 15 MG PO TABS: 15.0000 mg | ORAL_TABLET | Freq: Three times a day (TID) | ORAL | 0 refills | Status: DC

## 2018-05-27 MED ORDER — DIVALPROEX SODIUM 125 MG PO DR TAB: 125.0000 mg | DELAYED_RELEASE_TABLET | Freq: Every day | ORAL | 0 refills | Status: DC

## 2018-05-27 MED ORDER — MIRTAZAPINE 15 MG PO TABS: 15.0000 mg | ORAL_TABLET | Freq: Every day | ORAL | 0 refills | Status: DC

## 2018-05-27 MED ORDER — GABAPENTIN 100 MG PO CAPS: 100.0000 mg | ORAL_CAPSULE | Freq: Three times a day (TID) | ORAL | 2 refills | Status: DC

## 2018-05-27 NOTE — Telephone Encounter (Signed)
Pt called nurse line requesting refills on her Buspar, Gabapentin, Depakote and Remeron.   Pt states she recently found out she is no longer allergic to Gabapentin as previously thought.   Please advise.

## 2018-05-27 NOTE — Patient Outreach (Addendum)
[facility]) Care Management  05/27/2018  [name] [name] January 22, 1941 [id number]  CSW has been unable to reach pt today after multiple attempts and no voicemail. CSW will try again 05/28/18.   Eduard Clos, MSW, [place] Worker  [place] [phone]     05/28/2018    CSW made contact with pt and home visit confirmed for tomorrow, 05/29/2018.   Eduard Clos, MSW, [place] Worker  [place] [phone]

## 2018-05-27 NOTE — Patient Outreach (Addendum)
[name]) Care Management  05/27/2018  [name] [name] 1941-02-01 [id number]   Transition of care call #1   Referral received : 10/29 Referral source: [name] , LCSW Reason : Patient DC from [name] care on 10/22  PMHx includes but not limited to , 04/03/18-10/1 admission at [facility] health for weakness, recent fall , confusion, homicidal and suicidal ideation, other history includes , GERD, PVD, HTN, ,OSA, polypharmacy, Anxiety , depression,   Unsuccessful telephone outreach call to patient , no answer , voicemail not setup ,unable to leave a message.   Addendum  Return call to patient , successful outreach. Patient reports feeling better, denies fever, good appetite and improved nonproductive cough.   Patient discussed :  Fall risk She has completed home health therapy , she continues to use her cane and walker at home. No new falls. Reinforced fall prevention.   Social  Patient requesting information on income based senior apartment information , discussed still living with her daughter but wants to be on her own.  Patient discussed wanting to be involved with senior activities , stating that will help her stay focused and keep her mind calm. She discussed being told by home health about a program in [place] for seniors 9 am to 4 pm , that provides transportation to and from, meals at the [name] , activities such as coloring, arts, knitting stating that would down her line.   Appointments Has appointment this week 11/21 , with [name] [name] at [facility] practice for behavioral health follow up . Patient states that her brother will provide transportation. .  Patient stated she had thought about changing to MD and counselor to  [name], but decided that she wants to stay with same doctors that she has now.    Plan  Will discuss with [facility] LCSW [name] [name] patient discussion regarding , senior day activities that provide transportation , senior housing . Will plan  next transition of care outreach in the next week.    [facility] CM Care Plan Problem One     Most Recent Value  Care Plan Problem One  High risk for readmission related to recent discharge from SNF after hospitalizaiton for weakness, fall ,depression  Role Documenting the Problem One  Care Management [place] for Problem One  Active  [facility]N Long Term Goal   Patient will not experience a hospital readmission in the next 31 days   THN Long Term Goal Start Date  05/07/18  Interventions for Problem One Long Term Goal  Discussion on current clinical state, signs of infections and when to notify MD sooner for office visit .   [facility] CM Short Term Goal #1   Patient will attend all medical appointments over the next 30 days   [facility] CM Short Term Goal #1 Start Date  05/07/18  Interventions for Short Term Goal #1  Discussed recent PCP visit , and upcoming appointment with LCSW for mental health follow up , confirmed that she has transportation .   [facility] CM Short Term Goal #2   Over the next 30 days patient will report actively participatiing in home health services as ordered   [name] CM Short Term Goal #2 Start Date  05/07/18  [facility] CM Short Term Goal #2 Met Date  05/27/18  [facility] CM Short Term Goal #3  Patient will continue to montior blood pressure daily over the next 30 days   [facility] CM Short Term Goal #3 Start Date  05/13/18  Interventions for  Short Tern Goal #3  Reviewed recent blood pressure readings , normal ranges and continuing to keep a record.   [facility] CM Short Term Goal #4  Patient will be able to identify at least 2 strategies for depression managment over the next 30 days as evidenced by report.   [facility] CM Short Term Goal #4 Start Date  05/13/18  Interventions for Short Term Goal #4  Communication with [name] LCSW patient request for infomation on senior living, day activities, and transportation concerns        [name] Draft, RN, [place] Management Coordinator  [phone]-  Mobile [phone]- [place]

## 2018-05-28 ENCOUNTER — Other Ambulatory Visit: Payer: Self-pay

## 2018-05-28 MED ORDER — METOPROLOL TARTRATE 25 MG PO TABS: 12.5000 mg | ORAL_TABLET | Freq: Two times a day (BID) | ORAL | 11 refills | Status: DC

## 2018-05-29 ENCOUNTER — Other Ambulatory Visit: Payer: Self-pay | Admitting: *Deleted

## 2018-05-29 NOTE — Patient Outreach (Signed)
[facility]) Care Management  [facility] Social Work  05/29/2018  [name] [name] Jan 29, 1941 [id number]  Subjective:  "I want to move"  Objective:  [facility] CM Care Plan Problem One     Most Recent Value  Care Plan Problem One  Patient with low quality of life.  Role Documenting the Problem One  Clinical Social Worker  Care Plan for Problem One  Active  [facility] Long Term Goal   Patient will report a highter quality of lilfe in the next 90 days.   [facility] Long Term Goal Start Date  05/29/18  Interventions for Problem One Long Term Goal  CSW assessed and discussed opportuniities with pt for improving her daily activities, housing, legal matters.  [facility] CM Short Term Goal #1   Patient will report receipt of community resources to aide in her daily activity, housing options and legal support in the next 30 days.   [facility] CM Short Term Goal #1 Start Date  05/29/18  Interventions for Short Term Goal #1  CSW discussed and will provide pt with info related to housing, community/senior center activities, and legal support for HCPOA changes, etc.   [facility] CM Short Term Goal #2   Patient will report contact made and changes underway to her HCPOA and other legal matters to support her wishes in the next 30 days.   [facility] CM Short Term Goal #2 Start Date  05/29/18  Interventions for Short Term Goal #2  CSW discussed process for changing her HCPOA as well as encouarged her to inquire with lawyer about her beneficiary, property, etc.        Encounter Medications:  Outpatient Encounter Medications as of 05/29/2018  Medication Sig Note  . acetaminophen (TYLENOL) 500 MG tablet Take 1,000 mg by mouth every 6 (six) hours as needed for moderate pain or headache.   [name] amLODipine (NORVASC) 5 MG tablet TAKE 1 TABLET(5 MG) BY MOUTH DAILY (Patient taking differently: Take 5 mg by mouth daily. )   . aspirin EC 81 MG EC tablet Take 1 tablet (81 mg total) by mouth daily.   . brimonidine (ALPHAGAN) 0.15 % ophthalmic solution  Place 1 drop into both eyes 2 (two) times daily.   . busPIRone (BUSPAR) 15 MG tablet Take 1 tablet (15 mg total) by mouth 3 (three) times daily.   . carboxymethylcellulose (REFRESH PLUS) 0.5 % SOLN 1 drop daily as needed.   . cholecalciferol (VITAMIN D) 1000 units tablet Take 1,000 Units by mouth daily.   . citalopram (CELEXA) 20 MG tablet Take 1 tablet (20 mg total) by mouth daily.   . clopidogrel (PLAVIX) 75 MG tablet Take 1 tablet (75 mg total) by mouth every morning.   . Cyanocobalamin (VITAMIN B 12 PO) Take 1 tablet by mouth daily.   . diclofenac sodium (VOLTAREN) 1 % GEL Apply 4 g topically 4 (four) times daily. (Patient taking differently: Apply 4 g topically as needed. )   . divalproex (DEPAKOTE) 125 MG DR tablet Take 1 tablet (125 mg total) by mouth daily.   [name] docusate sodium (COLACE) 100 MG capsule Take 100 mg by mouth 2 (two) times daily.    . dorzolamide (TRUSOPT) 2 % ophthalmic solution Place 1 drop into both eyes daily.   . Evolocumab (REPATHA SURECLICK) 562 MG/ML SOAJ Inject 140 mg into the skin every 14 (fourteen) days. 04/03/2018: Due this week  . gabapentin (NEURONTIN) 100 MG capsule Take 1 capsule (100 mg total) by mouth 3 (three) times daily.   [name]  Latanoprostene Bunod (VYZULTA OP) Apply 1 drop to eye daily.    . metoprolol tartrate (LOPRESSOR) 25 MG tablet Take 0.5 tablets (12.5 mg total) by mouth 2 (two) times daily.   . mirtazapine (REMERON) 15 MG tablet Take 1 tablet (15 mg total) by mouth at bedtime.   . pantoprazole (PROTONIX) 20 MG tablet Take 20 mg by mouth daily.   . traMADol (ULTRAM) 50 MG tablet Take 1 tablet (50 mg total) by mouth every 12 (twelve) hours as needed. 60 tabs must last one month.    No facility-administered encounter medications on file as of 05/29/2018.     Functional Status:  In your present state of health, do you have any difficulty performing the following activities: 05/13/2018 04/04/2018  Hearing? N N  Vision? Y N  Difficulty concentrating or  making decisions? N N  Walking or climbing stairs? Y Y  Comment using walker,  -  Dressing or bathing? [name]  Comment has home health bath aide  weak unsteady gait  Doing errands, shopping? [name]  Comment daughter asssit  weak and unsteady  Conservation officer, nature and eating ? Y -  Using the Toilet? N -  In the past six months, have you accidently leaked urine? Y -  Comment wears depends -  Do you have problems with loss of bowel control? N -  Managing your Medications? Y -  Managing your Finances? N -  Housekeeping or managing your Housekeeping? Y -  Some recent data might be hidden    Fall/Depression Screening:  PHQ 2/9 Scores 05/29/2018 05/21/2018 05/07/2018 03/27/2018 03/22/2018 01/02/2018 12/07/2017  PHQ - 2 Score _0 0 0  PHQ- 9 Score - - - 20 21 - -    Assessment:  CSW met with pt in her home today. She was pleasant and voiced motivation to reach some personal goals that include new housing options, legal matters related to HCPOA changes property sale and beneficiary changes; as well as to become more active and involved with senior center programs.  She is seeing her "counselor", [name], tomorrow and states, " I love her".     Plan:  [facility] CM Care Plan Problem One     Most Recent Value  Care Plan Problem One  Patient with low quality of life.  Role Documenting the Problem One  Clinical Social Worker  Care Plan for Problem One  Active  [facility] Long Term Goal   Patient will report a highter quality of lilfe in the next 90 days.   [facility] Long Term Goal Start Date  05/29/18  Interventions for Problem One Long Term Goal  CSW assessed and discussed opportuniities with pt for improving her daily activities, housing, legal matters.  [facility] CM Short Term Goal #1   Patient will report receipt of community resources to aide in her daily activity, housing options and legal support in the next 30 days.   [facility] CM Short Term Goal #1 Start Date  05/29/18  Interventions for Short Term Goal #1  CSW discussed  and will provide pt with info related to housing, community/senior center activities, and legal support for HCPOA changes, etc.   [facility] CM Short Term Goal #2   Patient will report contact made and changes underway to her HCPOA and other legal matters to support her wishes in the next 30 days.   [facility] CM Short Term Goal #2 Start Date  05/29/18  Interventions for Short Term Goal #2  CSW discussed process  for changing her HCPOA as well as encouarged her to inquire with lawyer about her beneficiary, property, etc.       CSW will mail pt resource info and plan a f/u call in 7-10 days.    Eduard Clos, MSW, [place] Worker  [place] [phone]

## 2018-05-30 ENCOUNTER — Encounter: Payer: Self-pay | Admitting: Cardiovascular Disease

## 2018-05-30 ENCOUNTER — Ambulatory Visit: Payer: Self-pay | Admitting: Cardiovascular Disease

## 2018-05-30 ENCOUNTER — Telehealth: Payer: Self-pay | Admitting: *Deleted

## 2018-05-30 ENCOUNTER — Ambulatory Visit: Payer: PPO

## 2018-05-30 NOTE — Patient Outreach (Signed)
[place] [facility]) Care Management  05/30/2018  [name] [name] 1941-04-15 [id number]  BSW mailed patient resources as requested by CSW Eduard Clos.  Daneen Schick, BSW, CDP [facility] [phone]

## 2018-06-04 DIAGNOSIS — R0989 Other specified symptoms and signs involving the circulatory and respiratory systems: Secondary | ICD-10-CM | POA: Diagnosis not present

## 2018-06-04 DIAGNOSIS — I10 Essential (primary) hypertension: Secondary | ICD-10-CM | POA: Diagnosis not present

## 2018-06-04 DIAGNOSIS — Z6832 Body mass index (BMI) 32.0-32.9, adult: Secondary | ICD-10-CM | POA: Diagnosis not present

## 2018-06-04 DIAGNOSIS — R011 Cardiac murmur, unspecified: Secondary | ICD-10-CM | POA: Diagnosis not present

## 2018-06-04 DIAGNOSIS — G629 Polyneuropathy, unspecified: Secondary | ICD-10-CM | POA: Diagnosis not present

## 2018-06-04 DIAGNOSIS — E669 Obesity, unspecified: Secondary | ICD-10-CM | POA: Diagnosis not present

## 2018-06-04 DIAGNOSIS — Z905 Acquired absence of kidney: Secondary | ICD-10-CM | POA: Diagnosis not present

## 2018-06-04 DIAGNOSIS — Z85828 Personal history of other malignant neoplasm of skin: Secondary | ICD-10-CM | POA: Diagnosis not present

## 2018-06-05 ENCOUNTER — Ambulatory Visit: Payer: Self-pay | Admitting: *Deleted

## 2018-06-05 ENCOUNTER — Other Ambulatory Visit: Payer: Self-pay | Admitting: *Deleted

## 2018-06-05 ENCOUNTER — Other Ambulatory Visit: Payer: Self-pay | Admitting: Pharmacist

## 2018-06-05 NOTE — Patient Outreach (Signed)
[facility]) Care Management  06/05/2018  [name] [name] 10/01/1940 [id number]   Patient was called to follow up on pill packs.  HIPAA identifiers were obtained.    Patient is a 77 year old female with multiple medical conditions including but not limited to:  Anxiety, GERD, Hyperlipidemia, hypertension, depression, Osteoporosis, PVD, and resting tremor.   Patient decided she would like to begin to use pill packs. She was previously switched to [employer] because they offer pill packing services.   Patient's medications were reviewed via telephone:  Medications Reviewed Today    Reviewed by Elayne Guerin, [facility] (Pharmacist) on 06/05/18 at 1101  Med List Status: <None>  Medication Order Taking? Sig Documenting Provider Last Dose Status Informant  acetaminophen (TYLENOL) 500 MG tablet [id number] Yes Take 1,000 mg by mouth every 6 (six) hours as needed for moderate pain or headache. [provider] Taking Active Self  amLODipine (NORVASC) 5 MG tablet [id number] Yes TAKE 1 TABLET(5 MG) BY MOUTH DAILY  Patient taking differently:  Take 5 mg by mouth daily. TAKE 1 TABLET(5 MG) BY MOUTH DAILY   [name] Dawn, MD Taking Active Self  aspirin EC 81 MG EC tablet [id number] Yes Take 1 tablet (81 mg total) by mouth daily. Eileen Stanford, PA-C Taking Active Self  benzonatate (TESSALON) 100 MG capsule [id number] Yes TAKE 1 CAPSULE BY MOUTH THREE TIMES DAILY AS NEEDED FOR COUGH [provider] Taking Active   brimonidine (ALPHAGAN) 0.15 % ophthalmic solution [id number] Yes Place 1 drop into both eyes 2 (two) times daily. [provider] Taking Active Self  busPIRone (BUSPAR) 15 MG tablet [id number] Yes Take 1 tablet (15 mg total) by mouth 3 (three) times daily. [name] Dawn, MD Taking Active   carboxymethylcellulose (REFRESH PLUS) 0.5 % SOLN [id number] Yes 1 drop daily as needed. [provider] Taking Active Self  cholecalciferol (VITAMIN D)  1000 units tablet [id number] Yes Take 1,000 Units by mouth daily. [provider] Taking Active Self  citalopram (CELEXA) 20 MG tablet [id number] Yes Take 1 tablet (20 mg total) by mouth daily. [name] Dawn, MD Taking Active Self  clopidogrel (PLAVIX) 75 MG tablet [id number] Yes Take 1 tablet (75 mg total) by mouth every morning. [name] Dawn, MD Taking Active Self  Cyanocobalamin (VITAMIN B 12 PO) [id number] Yes Take 1 tablet by mouth daily. [provider] Taking Active Self  diclofenac sodium (VOLTAREN) 1 % GEL [id number] Yes Apply 4 g topically 4 (four) times daily.  Patient taking differently:  Apply 4 g topically as needed.    [name] Dawn, MD Taking Active Self  divalproex (DEPAKOTE) 125 MG DR tablet [id number] Yes Take 1 tablet (125 mg total) by mouth daily. [name] Dawn, MD Taking Active   docusate sodium (COLACE) 100 MG capsule [id number] Yes Take 100 mg by mouth 2 (two) times daily.  [provider] Taking Active Self  dorzolamide (TRUSOPT) 2 % ophthalmic solution [id number] Yes Place 1 drop into both eyes daily. [name] Ober, MD Taking Active   Evolocumab [facility] SURECLICK) 852 MG/ML Darden Palmer [id number] Yes Inject 140 mg into the skin every 14 (fourteen) days. [name], MD Taking Active Self           Med Note [name] [name] Jun 05, 2018 11:01 AM)    gabapentin (NEURONTIN) 100 MG capsule [id number] Yes Take 1 capsule (100 mg total) by mouth 3 (three) times daily. [name] Dawn, MD Taking Active   Latanoprostene Bunod [facility]  OP) [id number] Yes Apply 1 drop to eye daily.  [name], Carlisle Beers, MD Taking Active   metoprolol tartrate (LOPRESSOR) 25 MG tablet [id number] Yes Take 0.5 tablets (12.5 mg total) by mouth 2 (two) times daily. [name] Dawn, MD Taking Active   mirtazapine (REMERON) 15 MG tablet [id number] Yes Take 1 tablet (15 mg total) by mouth at bedtime. [name] Dawn, MD Taking Active   pantoprazole (PROTONIX) 20 MG tablet  [id number] Yes Take 20 mg by mouth daily. [provider] Taking Active   PROAIR HFA 108 [phone] Base) MCG/ACT inhaler [id number] Yes NHALE 2 PUFFS BY MOUTH FOUR TIMES DAILY AS NEEDED FOR SHORTNESS OF BREATH [provider] Taking Active   traMADol (ULTRAM) 50 MG tablet [id number] Yes Take 1 tablet (50 mg total) by mouth every 12 (twelve) hours as needed. 60 tabs must last one month. [name] Resides, MD Taking Active   Med List Note Elayne Guerin [name] 04/03/18 1113): on          Plan/Interventions  Called [facility] to inquire about their process.  Spoke with Mickel Baas who is over their [place] said they charge $10 for each month of pill packs.  Called patient back, she said she is not willing to pay the extra $10.  Patient will continue to get her medications delivered to her in pill bottles vs pill packs.  Patient's case will be closed since she does not want to pay the fee but would like to leave her prescription business at [facility] since it is convenient for her.   Elayne Guerin, PharmD, [place] Clinical Pharmacist [phone]

## 2018-06-05 NOTE — Patient Outreach (Signed)
[place]) Care Management  06/05/2018  [name] [name] [birth year] [id number]   CSW was unable to reach pt by phone today and no voice mail set up. CSW will try again in the next 3-2 business days.   Eduard Clos, MSW, [place] Worker  [place] [phone]

## 2018-06-07 ENCOUNTER — Other Ambulatory Visit: Payer: Self-pay | Admitting: *Deleted

## 2018-06-07 NOTE — Patient Outreach (Signed)
[facility] ([facility]) Care Management  06/07/2018  [name] [name] 10/24/1940 [id number]   Telephone assessment Final TOC call     Successful outreach call to patient . Patient discussed that she doing pretty good . Tolerating mobility using her walker , no recurrent falls denies dizziness .   Patient discussed that her blood pressure has been stable discussed recent reading of 140/80. Patent discussed continuing to monitor readings in home and limit salt in her diet.   Patient discussed that she had initial visit at [facility] in [place], Dr. Prochnau, she was able to see her NP at first visit.  Patient states this will make it easier to get to appointments since she is living in [place].   Patient denies any new concerns at this time.   Plan  Will plan follow up call in the next month for assessment of needs and consider case closure if no further care coordination needs to be met.   [facility] CM Care Plan Problem One     Most Recent Value  Care Plan Problem One  High risk for readmission related to recent discharge from SNF after hospitalizaiton for weakness, fall ,depression  Role Documenting the Problem One  Care Management Coordinator  Care Plan for Problem One  Active  [facility] Long Term Goal   Patient will not experience a hospital readmission in the next 31 days   [facility] Long Term Goal Start Date  05/07/18  [facility] Long Term Goal Met Date  06/07/18  [facility] CM Short Term Goal #1   Patient will attend all medical appointments over the next 30 days   [facility] CM Short Term Goal #1 Start Date  05/07/18  [facility] CM Short Term Goal #1 Met Date  06/07/18  [facility] CM Short Term Goal #2   Over the next 30 days patient will report actively participatiing in home health services as ordered   [facility] CM Short Term Goal #2 Start Date  05/07/18  [facility] CM Short Term Goal #2 Met Date  05/27/18  [facility] CM Short Term Goal #3  Patient will continue to montior blood pressure daily over the next 30 days   [facility] CM Short  Term Goal #3 Start Date  05/13/18  [facility] CM Short Term Goal #4  Patient will be able to identify at least 2 strategies for depression managment over the next 30 days as evidenced by report.   [facility] CM Short Term Goal #4 Start Date  05/13/18  Interventions for Short Term Goal #4  Communication with [facility] LCSW patient request for infomation on senior living, day activities, and transportation concerns     [facility] CM Care Plan Problem Two     Most Recent Value  Care Plan Problem Two  Hypertenison self care management   Role Documenting the Problem Two  Care Management Coordinator  Care Plan for Problem Two  Active  Interventions for Problem Two Long Term Goal   Reviewed with patient normal readings of blood pressure, reinforced contining to take medications as prescibed, be active as tolerated, limit salt in diet to help with control .   [facility] Long Term Goal  Knowledge related to self care management of Hypertension   [facility] Long Term Goal Start Date  06/07/18  [facility] CM Short Term Goal #1   Patient will be able to report continuing to monitor and record daily blood pressure reading over the next 30 days   [facility] CM Short Term Goal #1 Start Date  06/07/18    Interventions for Short Term Goal #2   Discussed with patient importance of keeping record of home blood pressure reading , taking record to MD office .   [facility] CM Short Term Goal #2   Patient will be able to identify at least 3 foods higher in salt to limit in diet over the next 30 days   [facility] CM Short Term Goal #2 Start Date  06/07/18  Interventions for Short Term Goal #2  Review in blood pressure book, foods high in salt and those lower, discussed daily salt limit in diet , of 2000 mg and reading labels.          , RN, PCCN [facility] Care Management,Care Management Coordinator  [phone]- Mobile [phone]- Toll Free Main Office    

## 2018-06-10 ENCOUNTER — Other Ambulatory Visit: Payer: Self-pay | Admitting: *Deleted

## 2018-06-10 NOTE — Patient Outreach (Addendum)
[name]) Care Management  [facility] Social Work  06/10/2018  [name] [name] 05-18-1941 [id number]  Subjective:  "Thank you for sending me all that good information"  Objective: [facility] CSW to assist patient and family with community based resources to aide in their well-being, quality of life and overall safety and needs.    Encounter Medications:  Outpatient Encounter Medications as of 06/10/2018  Medication Sig  . acetaminophen (TYLENOL) 500 MG tablet Take 1,000 mg by mouth every 6 (six) hours as needed for moderate pain or headache.  [name] amLODipine (NORVASC) 5 MG tablet TAKE 1 TABLET(5 MG) BY MOUTH DAILY (Patient taking differently: Take 5 mg by mouth daily. TAKE 1 TABLET(5 MG) BY MOUTH DAILY)  . aspirin EC 81 MG EC tablet Take 1 tablet (81 mg total) by mouth daily.  . benzonatate (TESSALON) 100 MG capsule TAKE 1 CAPSULE BY MOUTH THREE TIMES DAILY AS NEEDED FOR COUGH  . brimonidine (ALPHAGAN) 0.15 % ophthalmic solution Place 1 drop into both eyes 2 (two) times daily.  . busPIRone (BUSPAR) 15 MG tablet Take 1 tablet (15 mg total) by mouth 3 (three) times daily.  . carboxymethylcellulose (REFRESH PLUS) 0.5 % SOLN 1 drop daily as needed.  . cholecalciferol (VITAMIN D) 1000 units tablet Take 1,000 Units by mouth daily.  . citalopram (CELEXA) 20 MG tablet Take 1 tablet (20 mg total) by mouth daily.  . clopidogrel (PLAVIX) 75 MG tablet Take 1 tablet (75 mg total) by mouth every morning.  . Cyanocobalamin (VITAMIN B 12 PO) Take 1 tablet by mouth daily.  . diclofenac sodium (VOLTAREN) 1 % GEL Apply 4 g topically 4 (four) times daily. (Patient taking differently: Apply 4 g topically as needed. )  . divalproex (DEPAKOTE) 125 MG DR tablet Take 1 tablet (125 mg total) by mouth daily.  [name] docusate sodium (COLACE) 100 MG capsule Take 100 mg by mouth 2 (two) times daily.   . dorzolamide (TRUSOPT) 2 % ophthalmic solution Place 1 drop into both eyes daily.  . Evolocumab (REPATHA SURECLICK) 482  MG/ML SOAJ Inject 140 mg into the skin every 14 (fourteen) days.  [name] gabapentin (NEURONTIN) 100 MG capsule Take 1 capsule (100 mg total) by mouth 3 (three) times daily.  . Latanoprostene Bunod (VYZULTA OP) Apply 1 drop to eye daily.   . metoprolol tartrate (LOPRESSOR) 25 MG tablet Take 0.5 tablets (12.5 mg total) by mouth 2 (two) times daily.  . mirtazapine (REMERON) 15 MG tablet Take 1 tablet (15 mg total) by mouth at bedtime.  . pantoprazole (PROTONIX) 20 MG tablet Take 20 mg by mouth daily.  [name] PROAIR HFA 108 (90 Base) MCG/ACT inhaler NHALE 2 PUFFS BY MOUTH FOUR TIMES DAILY AS NEEDED FOR SHORTNESS OF BREATH  . traMADol (ULTRAM) 50 MG tablet Take 1 tablet (50 mg total) by mouth every 12 (twelve) hours as needed. 60 tabs must last one month.   No facility-administered encounter medications on file as of 06/10/2018.     Functional Status:  In your present state of health, do you have any difficulty performing the following activities: 05/13/2018 04/04/2018  Hearing? N N  Vision? Y N  Difficulty concentrating or making decisions? N N  Walking or climbing stairs? Y Y  Comment using walker,  -  Dressing or bathing? [name]  Comment has home health bath aide  weak unsteady gait  Doing errands, shopping? [name]  Comment daughter asssit  weak and unsteady  Conservation officer, nature and eating ? Y -  Using the Toilet? N -  In the past six months, have you accidently leaked urine? Y -  Comment wears depends -  Do you have problems with loss of bowel control? N -  Managing your Medications? Y -  Managing your Finances? N -  Housekeeping or managing your Housekeeping? Y -  Some recent data might be hidden    Fall/Depression Screening:  PHQ 2/9 Scores 05/29/2018 05/21/2018 05/07/2018 03/27/2018 03/22/2018 01/02/2018 12/07/2017  PHQ - 2 Score 1 1 1 6 6  0 0  PHQ- 9 Score - - - 20 21 - -    Assessment: CSW made contact with pt today by phone. Pt was very appreciative of info mailed to her- states she is already  making arrangements for transportation. She is hopeful to get to the [place] soon and is excited about the programs, activities and free lunch provided.     Plan:  [facility] CM Care Plan Problem One     Most Recent Value  Care Plan Problem One  Patient with low quality of life.  Role Documenting the Problem One  Clinical Social Worker  Care Plan for Problem One  Active  [facility] Long Term Goal   Patient will report a highter quality of lilfe in the next 90 days.   [facility] Long Term Goal Start Date  05/29/18  Interventions for Problem One Long Term Goal  Pt reports her Thanksgiving was "wonderful" . She is planning to look into the community resources provided.  [facility] CM Short Term Goal #1   Patient will report receipt of community resources to aide in her daily activity, housing options and legal support in the next 30 days.   [facility] CM Short Term Goal #1 Start Date  05/29/18  [facility] CM Short Term Goal #1 Met Date  06/10/18  Interventions for Short Term Goal #1  Pt confirms receiving info and is very appreciative. She is coordinating rides to appointments with [facility] and plans to visit/attend the [employer] programs/activities,  [facility] CM Short Term Goal #2   Patient will report contact made and changes underway to her HCPOA and other legal matters to support her wishes in the next 30 days.   [facility] CM Short Term Goal #2 Start Date  05/29/18  Interventions for Short Term Goal #2  No updates as of yet on this plan-       CSW plans a f/u call in 2 weeks for updates and further   Eduard Clos, MSW, [place] Worker  [place] [phone]

## 2018-06-20 ENCOUNTER — Ambulatory Visit: Payer: Self-pay | Admitting: Family Medicine

## 2018-07-08 ENCOUNTER — Other Ambulatory Visit: Payer: Self-pay | Admitting: *Deleted

## 2018-07-08 NOTE — Patient Outreach (Signed)
[facility]) Care Management  07/08/2018  [name] [name] 11/07/1940 [id number]   Telephone follow up call  Unsuccessful outreach call to patient , no answer, message states no voice mail set up, unable to leave a message.    Plan Will await return call, if no response will plan return call in the next 4 business days.    Joylene Draft, RN, [place] Management Coordinator  [phone]- Mobile [phone]- Toll Free Main Office

## 2018-07-11 ENCOUNTER — Other Ambulatory Visit: Payer: Self-pay | Admitting: *Deleted
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
## 2018-07-19 ENCOUNTER — Other Ambulatory Visit: Payer: Self-pay | Admitting: *Deleted

## 2018-07-19 ENCOUNTER — Encounter: Payer: Self-pay | Admitting: *Deleted

## 2018-07-19 NOTE — Patient Outreach (Signed)
[place] [facility]) Care Management  07/19/2018  JOLIN BENAVIDES Sep 25, 1940 [id number]   CSW was unable to reach pt today by phone. CSW will mail pt an unsuccessful outreach letter and attempt contact again next week.    Eduard Clos, MSW, [place] Worker  [place] [phone]

## 2018-07-24 ENCOUNTER — Ambulatory Visit: Payer: Self-pay | Admitting: *Deleted

## 2018-07-24 DIAGNOSIS — Z6832 Body mass index (BMI) 32.0-32.9, adult: Secondary | ICD-10-CM | POA: Diagnosis not present

## 2018-07-24 DIAGNOSIS — E669 Obesity, unspecified: Secondary | ICD-10-CM | POA: Diagnosis not present

## 2018-07-24 DIAGNOSIS — G629 Polyneuropathy, unspecified: Secondary | ICD-10-CM | POA: Diagnosis not present

## 2018-07-24 DIAGNOSIS — R600 Localized edema: Secondary | ICD-10-CM | POA: Diagnosis not present

## 2018-07-25 ENCOUNTER — Ambulatory Visit: Payer: Self-pay | Admitting: *Deleted

## 2018-07-26 ENCOUNTER — Ambulatory Visit: Payer: Self-pay | Admitting: *Deleted

## 2018-07-30 DIAGNOSIS — R5383 Other fatigue: Secondary | ICD-10-CM | POA: Diagnosis not present

## 2018-07-30 DIAGNOSIS — L821 Other seborrheic keratosis: Secondary | ICD-10-CM | POA: Diagnosis not present

## 2018-07-30 DIAGNOSIS — R300 Dysuria: Secondary | ICD-10-CM | POA: Diagnosis not present

## 2018-07-30 DIAGNOSIS — Z79899 Other long term (current) drug therapy: Secondary | ICD-10-CM | POA: Diagnosis not present

## 2018-07-30 DIAGNOSIS — D539 Nutritional anemia, unspecified: Secondary | ICD-10-CM | POA: Diagnosis not present

## 2018-07-30 DIAGNOSIS — M5440 Lumbago with sciatica, unspecified side: Secondary | ICD-10-CM | POA: Diagnosis not present

## 2018-07-30 DIAGNOSIS — L578 Other skin changes due to chronic exposure to nonionizing radiation: Secondary | ICD-10-CM | POA: Diagnosis not present

## 2018-07-30 DIAGNOSIS — E559 Vitamin D deficiency, unspecified: Secondary | ICD-10-CM | POA: Diagnosis not present

## 2018-07-30 DIAGNOSIS — M5137 Other intervertebral disc degeneration, lumbosacral region: Secondary | ICD-10-CM | POA: Diagnosis not present

## 2018-07-30 DIAGNOSIS — G8929 Other chronic pain: Secondary | ICD-10-CM | POA: Diagnosis not present

## 2018-07-30 DIAGNOSIS — M129 Arthropathy, unspecified: Secondary | ICD-10-CM | POA: Diagnosis not present

## 2018-07-30 DIAGNOSIS — M542 Cervicalgia: Secondary | ICD-10-CM | POA: Diagnosis not present

## 2018-07-30 DIAGNOSIS — Z780 Asymptomatic menopausal state: Secondary | ICD-10-CM | POA: Diagnosis not present

## 2018-07-30 DIAGNOSIS — L570 Actinic keratosis: Secondary | ICD-10-CM | POA: Diagnosis not present

## 2018-07-30 DIAGNOSIS — E7800 Pure hypercholesterolemia, unspecified: Secondary | ICD-10-CM | POA: Diagnosis not present

## 2018-08-06 ENCOUNTER — Other Ambulatory Visit: Payer: Self-pay | Admitting: *Deleted

## 2018-08-07 NOTE — Patient Outreach (Signed)
[place] [facility]) Care Management  08/07/2018  [name] [name] 1941-04-05 [id number]   CSW has been unable to reach pt by phone and will plan case closure at this time. CSW will make PCP and [facility] team aware and ask that if they determine pt is interested in CSW support/services, to reconsult CSW and/or have pt contact CSW.    Eduard Clos, MSW, [place] Worker  [place] [phone]

## 2018-08-12 DIAGNOSIS — I10 Essential (primary) hypertension: Secondary | ICD-10-CM | POA: Diagnosis not present

## 2018-08-12 DIAGNOSIS — I340 Nonrheumatic mitral (valve) insufficiency: Secondary | ICD-10-CM | POA: Diagnosis not present

## 2018-08-12 DIAGNOSIS — F319 Bipolar disorder, unspecified: Secondary | ICD-10-CM | POA: Diagnosis not present

## 2018-08-12 DIAGNOSIS — R42 Dizziness and giddiness: Secondary | ICD-10-CM | POA: Diagnosis not present

## 2018-08-12 DIAGNOSIS — Z79899 Other long term (current) drug therapy: Secondary | ICD-10-CM | POA: Diagnosis not present

## 2018-08-12 DIAGNOSIS — R110 Nausea: Secondary | ICD-10-CM | POA: Diagnosis not present

## 2018-08-12 DIAGNOSIS — E785 Hyperlipidemia, unspecified: Secondary | ICD-10-CM | POA: Diagnosis not present

## 2018-08-13 DIAGNOSIS — R42 Dizziness and giddiness: Secondary | ICD-10-CM | POA: Diagnosis not present

## 2018-08-14 DIAGNOSIS — R42 Dizziness and giddiness: Secondary | ICD-10-CM | POA: Diagnosis not present

## 2018-08-15 ENCOUNTER — Other Ambulatory Visit: Payer: Self-pay | Admitting: *Deleted

## 2018-08-15 NOTE — Patient Outreach (Signed)
[place] [facility]) Care Management  08/15/2018  [name] [name] 24-Oct-1940 [id number]   Transition of Care Referral   Referral Date: 08/15/2018 Referral Source: [facility] Urgent Outreach for [facility] Date of Admission:  Diagnosis:  Dizziness and giddiness  Date of Discharge:Facility: [facility] on 08/14/18.  Insurance:  HTA Being followed by [name] RN CM    Outreach attempt # 1 When dialed [phone] (home/mobile) Cm informed by automated voice message that the patient does not have a voice mail box that has not been set up yet This is the only number listed for her  Daughter, [name] [name] reached at [phone] and she report the patient no longer lives with her and at times does not answer her telephone  [facility] RN CM left HIPAA compliant voicemail message along with CM's contact info. Daughter to have patient return a cal to CM  Conditions: 04/03/18-10/1 admission at [facility] health for weakness, recent fall , confusion, homicidal and suicidal ideation,AKI other history includes , GERD, PVD, HTN, ,OSA, polypharmacy, Anxiety , depression/MDD, HDL   Plan: [facility] RN CM sent an unsuccessful outreach letter and scheduled this patient for another call attempt within 4 business days    [name] L. Lavina Hamman, RN, BSN, [place] Management Care Coordinator Direct Number [phone] Mobile number [phone]  Main [facility] number [phone] Fax number [phone]

## 2018-08-15 NOTE — Patient Outreach (Signed)
[place] [facility]) Care Management  08/15/2018  ARTRICE KRAKER [birth year] [id number]  Transition of Care Referral  Referral Date: 08/15/2018 Referral Source: [facility] Urgent Outreach for [facility] Date of Admission: 08/12/2018 ? Diagnosis:  Dizziness and giddiness  Date of Discharge:Facility: [facility] on 08/14/18.  Insurance:  HTA Being followed by [employer] RN CM    Patient returned a call to [place] after [name] called her  Mrs Lamphear reports being frustrated related to [place] her that CM was attempting to call her related to "pain medication" CM clarified with her that CM did not inform her daughter that CM needed to speak with her about pain medication Mrs Meggison requested [name] contact number be added to Epic and for [name] to be called if she does not answer her phone. She reports difficulty with her phone ringer volume.   Patient is able to verify HIPAA Reviewed and addressed Transition of care referral to [facility] with patient She voiced understanding and appreciation for the call  Mrs Tatham confirms her admission to [facility] and reports she was dx with vertigo and is scheduled to start outpatient therapy on Monday 08/19/17. She confirms not falling related to her vertigo (last fall in September-October 2019) but reports staggering a few days, feeling "dazed", being nauseated and not being able to walk. She states she was at the local senior center when her s/s worsened and she was sent to the ED. She reports having emesis at the hospital She voices appreciation for the senior center, meeting new friends and "they care there"  She reports noting some nausea and dizziness this am but it resolved without emesis She confirms she is feeling better related to her vertigo and depression She discussed that she notes she becomes frustrated easily. I have no patience." CM discussed her following up with MD related to hormones or change in medication. After this was discussed she  discussed that she did have a hysterectomy after her last child and had not been evaluated She states when she calls her primary MD for a follow up appointment, she will ask about evaluation and discuss her issues with increase frustration and no patience.  She discussed also that she has made an appointment to see a provider related to her sciatica back pain. Dr Cherylann Ratel    Social: Mrs Cowper now lives with her other daughter, [name]. She reports less depression s/s. She is independent with most care but does need some assistance with care needs She is now using [facility] to assist with transportation to medical appointments She voices appreciation for [facility] resources provided   Conditions: Vertigo 08/12/2018,  04/03/18-10/1 admission at [facility] health for weakness, recent fall , confusion, homicidal and suicidal ideation,AKI other history includes , GERD, PVD, HTN, ,OSA, polypharmacy, Anxiety , depression/MDD, HDL  Medications: She denies concerns with taking medications as prescribed, affording medications, side effects of medications and questions about medications  Appointments: Monday 08/19/2018 Starts Outpatient tx for vertigo To make her own appointment to see primary MD  Advance Directives: Has POA not interested in making changes  Consent: [facility] RN CM reviewed [facility] services with patient. Patient gave verbal consent for services.   Plan: [facility] RN CM will close case at this time as patient has been assessed and no needs identified/needs resolved.  CM discussed with Mrs Whe[place] that she may receive further calls from [facility] RN later in February-March and she voiced appreciation  Pt encouraged to return a call to [facility]  RN CM prn   Joelene Millin L. Lavina Hamman, RN, BSN, [place] Management Care Coordinator Direct Number [phone] Mobile number [phone]  Main [facility] number [phone] Fax number [phone]

## 2018-08-20 ENCOUNTER — Other Ambulatory Visit: Payer: Self-pay | Admitting: *Deleted

## 2018-08-20 NOTE — Patient Outreach (Signed)
[place] [facility]) Care Management  08/20/2018  [name] [name] April 14, 1941 [id number]  Telephone follow up call    Telephonic follow up Referral received : 10/29 Referral source: [facility] , LCSW Reason : Patient DC from [facility] care on 10/22   PMHx includes but not limited to , 04/03/18-10/1 admission at [facility] health for weakness, recent fall , confusion, homicidal and suicidal ideation,AKI other history includes , GERD, PVD, HTN, ,OSA, polypharmacy, Anxiety , depression.  Noted patient admission to [facility] 2/3-2/5 for Dx : Vertigo  Patient has been contacted by telephonic case manager after discharge and no needs identified. Will continue community care coordinator follow up .   Successful outreach call to patient, patient discussed recent hospital admission related to vertigo. . Patient discussed that she is feeling so much better now .   Conditions  Vertigo discussed improvement, continues to use her walker , patient denies having a fall. Patient to have outpatient rehab for vertigo and has made contact with [facility], she had to cancel her initial visit for 2/10 but will reschedule.  History of Hypertension , metoprolol has been discontinued at discharge, patient reports her blood pressure has been running 100-110/65 with heart rate.  90.  Lower leg swelling, patient reports recently being started on lasix after PCP office visit related to concern for swelling reports some improvement. Patient reports trying to keep legs elevated during the day and that helps some. Patient reports checking her weights about once weekly and she is staying at 150 to 153 range. Discussed benefit of weighing more frequently daily to get a better picture of sudden weight gain of of 3 pounds in a day or 5 in a week. Patient reports some shortness of breath with mobility but at her baseline line and does not requires albuterol inhaler.   Social  Patient still lives with her daughter  [name] [name], that is able to assist around home as needed. Patient uses [facility] transportation when she attends Adult day program and for medical appointments .  Medications  Patient reports taking medications as prescribes ,she is able to state metoprolol has been discontinued at discharge and follow up with PCP needed for further recommendations . Patient was recently discharged from hospital and all medications have been reviewed.  Appointments Discussed with patient importance of scheduling post discharge visit with PCP and rescheduling out patient therapy.   Plan  Will plan follow up call in the next week regarding patient PCP office visit.  Reviewed fall precautions .    [facility] CM Care Plan Problem One     Most Recent Value  Care Plan Problem One  Recent hospital admission for vertigo at risk for readmission   Role Documenting the Problem One  Care Management [place] for Problem One  Active  [facility] Long Term Goal   Patient will verbalize no readmission over the next 30 days   THN Long Term Goal Start Date  08/20/18  Interventions for Problem One Long Term Goal  Reviewed recent discharge instructions,encouraged to notify MD sooner if reoccurance of vertigo,  advised regarding taking medications as prescribed.   [facility] CM Short Term Goal #1   Patient will be able to report scheduling and attending PCP post hospital visit over the next 14 days   [facility]N CM Short Term Goal #1 Start Date  08/20/18  Interventions for Short Term Goal #1  Advised regarding the importance of prompt PCP visit after discharge for follow up , review of medicaitons  and medical plan.   [facility]N CM Short Term Goal #2   Patient will be able to verbalize no falls over the next 31 days   [facility]N CM Short Term Goal #2 Start Date  08/20/18  Interventions for Short Term Goal #2  Advised regarding fall prevention measures, using walker , attending outpatient rehab for education on managing mobility safely with vertigo diagnosis.   [facility]N CM  Short Term Goal #3  Patient will continue to montior blood pressure daily over the next 30 days   [facility]N CM Short Term Goal #3 Start Date  08/20/18  Interventions for Short Tern Goal #3  Reviewed with patient to continue monitoring blood pressure keep a record and take to PCP office, reviewed normal blood pressure readings  and low/high reading  .     THN CM Care Plan Problem Two     Most Recent Value  Care Plan Problem Two  Hypertenison self care management   Role Documenting the Problem Two  Care Management Coordinator  Care Plan for Problem Two  Active  [facility]N Long Term Goal  Patient will be able to verbalize at least 3 measures in self care management of Hypertension over the next 60 days    THN Long Term Goal Start Date  06/07/18  [facility] Long Term Goal Met Date  08/20/18  [facility] CM Short Term Goal #1   Patient will be able to report continuing to monitor and record daily blood pressure reading over the next 30 days   THN CM Short Term Goal #1 Start Date  06/07/18  [facility] CM Short Term Goal #1 Met Date   07/11/18  [facility] CM Short Term Goal #2   Patient will be able to identify at least 3 foods higher in salt to limit in diet over the next 30 days   THN CM Short Term Goal #2 Start Date  06/07/18  [facility] CM Short Term Goal #2 Met Date  07/11/18  [facility] CM Short Term Goal #3   Patient will be able to verbalize decrease swelling in lower legs over the next 30 days   THN CM Short Term Goal #3 Start Date  07/11/18  [facility] CM Short Term Goal #3 Met Date  08/20/18      Joylene Draft, RN, [place] Management Coordinator  [phone]- Mobile [phone]- [place]

## 2018-08-21 DIAGNOSIS — R05 Cough: Secondary | ICD-10-CM | POA: Diagnosis not present

## 2018-08-21 DIAGNOSIS — R062 Wheezing: Secondary | ICD-10-CM | POA: Diagnosis not present

## 2018-08-21 DIAGNOSIS — Z09 Encounter for follow-up examination after completed treatment for conditions other than malignant neoplasm: Secondary | ICD-10-CM | POA: Diagnosis not present

## 2018-08-21 DIAGNOSIS — Z6831 Body mass index (BMI) 31.0-31.9, adult: Secondary | ICD-10-CM | POA: Diagnosis not present

## 2018-08-21 DIAGNOSIS — E669 Obesity, unspecified: Secondary | ICD-10-CM | POA: Diagnosis not present

## 2018-08-28 ENCOUNTER — Other Ambulatory Visit: Payer: Self-pay | Admitting: *Deleted

## 2018-08-28 NOTE — Patient Outreach (Signed)
[place] [facility]) Care Management  08/28/2018  [name] [name] September 13, 1940 [id number]   Telephonic follow upReferral received : 10/29 Referral source: [facility] , LCSW Reason : Patient DC from [facility] care on 10/22   PMHxincludes but not limited to , 04/03/18-10/1 admission at [facility] health for weakness, recent fall , confusion, homicidal and suicidal ideation,AKI other history includes , GERD, PVD, HTN, ,OSA, polypharmacy, Anxiety , depression.  Noted patient admission to [facility] 2/3-2/5 for Dx : Vertigo  Unsuccessful outreach call to patient , no answer unable to leave a message due to voice mail not set up.  Plan Will plan return call in the next 4 business days .   Joylene Draft, RN, [place] Management Coordinator  [phone]- Mobile [phone]- Toll Free Main Office

## 2018-09-02 ENCOUNTER — Other Ambulatory Visit: Payer: Self-pay | Admitting: *Deleted

## 2018-09-02 NOTE — Patient Outreach (Signed)
[facility]) Care Management  09/02/2018  LIESE DIZDAREVIC August 12, 1940 [id number]  Telephonic follow up, 2nd  All attempt    Initial Referral received : 10/29 Referral source: [facility] , LCSW Reason : Patient DC from [facility] care on 10/22   PMHxincludes but not limited to , 04/03/18-10/1 admission at [facility] health for weakness, recent fall , confusion, homicidal and suicidal ideation,AKI other history includes , GERD, PVD, HTN, ,OSA, polypharmacy, Anxiety , depression.  Noted patient admission to [facility] 2/3-2/5 for Dx : Vertigo.  Successful follow up call to patient,  She discussed that she is doing just fine and  that she is at the senior center now and request return after 1215.   1430 Returned call to patient , no answer, unable to leave a message, voice mail not set up .   Plan Will send unsuccessful outreach letter and schedule 3rd call attempt in the next 4 business days.    Joylene Draft, RN, [place] Management Coordinator  [phone]- Mobile [phone]- Toll Free Main Office

## 2018-09-05 ENCOUNTER — Other Ambulatory Visit: Payer: Self-pay | Admitting: *Deleted

## 2018-09-05 NOTE — Patient Outreach (Signed)
[place] [facility]) Care Management  09/05/2018  [name] [name] 1940-11-06 [id number]  Telephone follow up call    Telephonic follow upReferral received : 10/29 Referral source: [facility] , LCSW Reason : Patient DC from [facility] care on 10/22   PMHxincludes but not limited to , 04/03/18-10/1 admission at [facility] health for weakness, recent fall , confusion, homicidal and suicidal ideation,AKI other history includes , GERD, PVD, HTN, ,OSA, polypharmacy, Anxiety , depression.  Noted patient admission to [facility] 2/3-2/5 for Dx : Vertigo  Successful outreach call to patient, she discussed feeling pretty good on today.  Patient discussed feeling steadier on her stronger on her feet, she denies feeling dizzy . Patient states that she is not going to outpatient therapy any longer just working on the exercises at home that she has been taught. Discussed falls precautions and continuing to use her rolling walker .  She further discussed Hypertension She discussed she doesn't check her blood pressure as much at home now because  she gets her blood pressure checked daily at the senior center and has a blood pressure book provided by center to keep track of readings. Patient states that her blood pressure is doing good reports recent reading 130/80.  Patient continues to limit salt in her diet, her daughter prepares meals also and does not add salt.  Lower leg swelling  Patient reports decreased swelling in lower legs, she is trying to keep them elevated during the day. Patient denies any sudden increase in weight reports current with 148-150 range.  Patient reports attending post discharge visit with PCP and concern regarding, circulation and she has follow up visit with doctor in [employer] in next week.  Social Patient reports enjoying visiting senior center,she goes 5 days a week, she reports having meal there once daily , able to participate in activities, take day  trips. She discussed looking forward to upcoming day trip and other activities.   Plan Will plan follow up call in the next month and if no new concerns will plan transition to health coach for continued disease management of hypertension .    Joylene Draft, RN, [place] Management Coordinator  [phone]- Mobile [phone]- Toll Free Main Office

## 2018-09-06 DIAGNOSIS — M5137 Other intervertebral disc degeneration, lumbosacral region: Secondary | ICD-10-CM | POA: Diagnosis not present

## 2018-09-06 DIAGNOSIS — M542 Cervicalgia: Secondary | ICD-10-CM | POA: Diagnosis not present

## 2018-09-06 DIAGNOSIS — G8929 Other chronic pain: Secondary | ICD-10-CM | POA: Diagnosis not present

## 2018-09-06 DIAGNOSIS — M5440 Lumbago with sciatica, unspecified side: Secondary | ICD-10-CM | POA: Diagnosis not present

## 2018-09-10 ENCOUNTER — Other Ambulatory Visit: Payer: Self-pay

## 2018-09-10 DIAGNOSIS — E669 Obesity, unspecified: Secondary | ICD-10-CM | POA: Diagnosis not present

## 2018-09-10 DIAGNOSIS — Z6832 Body mass index (BMI) 32.0-32.9, adult: Secondary | ICD-10-CM | POA: Diagnosis not present

## 2018-09-10 DIAGNOSIS — G8929 Other chronic pain: Secondary | ICD-10-CM | POA: Diagnosis not present

## 2018-09-10 DIAGNOSIS — R252 Cramp and spasm: Secondary | ICD-10-CM | POA: Diagnosis not present

## 2018-09-10 DIAGNOSIS — M549 Dorsalgia, unspecified: Secondary | ICD-10-CM | POA: Diagnosis not present

## 2018-09-11 MED ORDER — MIRTAZAPINE 15 MG PO TABS: 15.0000 mg | ORAL_TABLET | Freq: Every day | ORAL | 1 refills | Status: DC

## 2018-09-18 ENCOUNTER — Other Ambulatory Visit: Payer: Self-pay | Admitting: *Deleted

## 2018-09-18 ENCOUNTER — Other Ambulatory Visit: Payer: Self-pay

## 2018-09-18 NOTE — Patient Outreach (Signed)
[place] [facility]) Care Management  [place]  09/18/2018   [name] [name] 21-Oct-1940 [id number]   Patient case closure   PMHxincludes but not limited to , 04/03/18-10/1 admission at [facility] health for weakness, recent fall , confusion, homicidal and suicidal ideation,AKI other history includes , GERD, PVD, HTN, ,OSA, polypharmacy, Anxiety , depression.  Noted patient admission to [facility] 2/3-2/5 for Dx : Vertigo  Subjective:  Successful outreach call to patient, she discussed feeling pretty good on today . Patient discussed attending senior center on a daily basis and enjoying that. She continues to use [employer] transportation and her daughter provides transportation to out of town visits.  Patient discussed:  Hypertension She monitors occasionally at home , but gets it checked about once once every 2 weeks at senior center and they keep a record she is unable to state recent reading but reports it was good. Patient discussed continuing to watch her salt , reports not using salt.  Lower Leg swelling She reports some improvement in swelling when she wears her compression stocking, no worsening of lower leg edema , reports taking medications as prescribed . Patient discussed upcoming visit in next month with Dr.Berry for vascular check up stating her history of having stents in the past.   Encounter Medications:  Outpatient Encounter Medications as of 09/18/2018  Medication Sig  . acetaminophen (TYLENOL) 500 MG tablet Take 1,000 mg by mouth every 6 (six) hours as needed for moderate pain or headache.  [name] amLODipine (NORVASC) 5 MG tablet TAKE 1 TABLET(5 MG) BY MOUTH DAILY (Patient taking differently: Take 5 mg by mouth daily. TAKE 1 TABLET(5 MG) BY MOUTH DAILY)  . aspirin EC 81 MG EC tablet Take 1 tablet (81 mg total) by mouth daily.  . benzonatate (TESSALON) 100 MG capsule TAKE 1 CAPSULE BY MOUTH THREE TIMES DAILY AS NEEDED FOR COUGH  . brimonidine (ALPHAGAN) 0.15 %  ophthalmic solution Place 1 drop into both eyes 2 (two) times daily.  . busPIRone (BUSPAR) 15 MG tablet Take 1 tablet (15 mg total) by mouth 3 (three) times daily.  . carboxymethylcellulose (REFRESH PLUS) 0.5 % SOLN 1 drop daily as needed.  . cholecalciferol (VITAMIN D) 1000 units tablet Take 1,000 Units by mouth daily.  . citalopram (CELEXA) 20 MG tablet Take 1 tablet (20 mg total) by mouth daily.  . clopidogrel (PLAVIX) 75 MG tablet Take 1 tablet (75 mg total) by mouth every morning.  . Cyanocobalamin (VITAMIN B 12 PO) Take 1 tablet by mouth daily.  . diclofenac sodium (VOLTAREN) 1 % GEL Apply 4 g topically 4 (four) times daily. (Patient taking differently: Apply 4 g topically as needed. )  . divalproex (DEPAKOTE) 125 MG DR tablet Take 1 tablet (125 mg total) by mouth daily.  [name] docusate sodium (COLACE) 100 MG capsule Take 100 mg by mouth 2 (two) times daily.   . dorzolamide (TRUSOPT) 2 % ophthalmic solution Place 1 drop into both eyes daily.  . Evolocumab (REPATHA SURECLICK) 268 MG/ML SOAJ Inject 140 mg into the skin every 14 (fourteen) days.  . furosemide (LASIX) 20 MG tablet Take 20 mg by mouth.  . gabapentin (NEURONTIN) 100 MG capsule Take 1 capsule (100 mg total) by mouth 3 (three) times daily. (Patient not taking: Reported on 08/20/2018)  . Latanoprostene Bunod (VYZULTA OP) Apply 1 drop to eye daily.   . metoprolol tartrate (LOPRESSOR) 25 MG tablet Take 0.5 tablets (12.5 mg total) by mouth 2 (two) times daily. (Patient not taking:  Reported on 08/20/2018)  . mirtazapine (REMERON) 15 MG tablet Take 1 tablet (15 mg total) by mouth at bedtime.  . pantoprazole (PROTONIX) 20 MG tablet Take 20 mg by mouth daily.  . pregabalin (LYRICA) 25 MG capsule Take 25 mg by mouth 3 (three) times daily.  [name] PROAIR HFA 108 (90 Base) MCG/ACT inhaler NHALE 2 PUFFS BY MOUTH FOUR TIMES DAILY AS NEEDED FOR SHORTNESS OF BREATH  . traMADol (ULTRAM) 50 MG tablet Take 1 tablet (50 mg total) by mouth every 12 (twelve)  hours as needed. 60 tabs must last one month.   No facility-administered encounter medications on file as of 09/18/2018.     Functional Status:  In your present state of health, do you have any difficulty performing the following activities: 08/20/2018 05/13/2018  Hearing? N N  Vision? Y Y  Difficulty concentrating or making decisions? N N  Walking or climbing stairs? Y Y  Comment using walker  using walker,   Dressing or bathing? N Y  Comment - has home health bath aide   Doing errands, shopping? [name]  Comment daughter assist and [employer] for transportation daughter Engineer, manufacturing systems and eating ? N Y  Using the Toilet? N N  In the past six months, have you accidently leaked urine? Y Y  Comment - wears depends  Do you have problems with loss of bowel control? N N  Managing your Medications? N Y  Managing your Finances? N N  Housekeeping or managing your Housekeeping? [name]  Comment daughter assist  -  Some recent data might be hidden    Fall/Depression Screening: Fall Risk  05/29/2018 05/21/2018 05/07/2018  Falls in the past year? 1 1 Yes  Comment - - -  Number falls in past yr: - 1 1  Injury with Fall? - 1 Yes  Risk Factor Category  - - High Fall Risk  Risk for fall due to : - - History of fall(s)  Follow up - - Falls prevention discussed   PHQ 2/9 Scores 08/15/2018 05/29/2018 05/21/2018 05/07/2018 03/27/2018 03/22/2018 01/02/2018  PHQ - 2 Score 0 1 1 1 6 6  0  PHQ- 9 Score - - - - 20 21 -    Assessment:  Patient will benefit from ongoing education and support for chronic condition of hypertension .  Discussed [facility] health coach program for telephonic disease management patient agreeable   Plan:  Will close case to [facility] complex care management and transition to Disease management.  Will send PCP discipline closure letter.   Joylene Draft, RN, [place] Management Coordinator  [phone]- Mobile [phone]- Toll Free Main Office

## 2018-09-19 NOTE — Patient Outreach (Signed)
[place] [facility]) Care Management  [place]  09/18/2018   [name] CURINGTON 06-Sep-1940 [id number]   Patient telephone outreach for case closure    Referral received : 10/29 Referral source: [facility] , LCSW Reason : Patient DC from [facility] care on 10/22  PMHxincludes but not limited to , 04/03/18-10/1 admission at [facility] health for weakness, recent fall , confusion, homicidal and suicidal ideation,AKI other history includes , GERD, PVD, HTN, ,OSA, polypharmacy, Anxiety , depression, hypertension  Noted patient admission to [facility] 2/3-2/5 for Dx : Vertigo  Subjective:  Successful outreach call to patient, she discussed feeling pretty good on today . Patient discussed attending senior center on a daily basis and enjoying that. She continues to use [employer] transportation and her daughter provides transportation to out of town visits.  Patient discussed:  Hypertension She monitors occasionally at home , but gets it checked about once once every 2 weeks at senior center and they keep a record she is unable to state recent reading but reports it was good. Patient discussed continuing to watch her salt , reports not using salt.  Lower Leg swelling She reports some improvement in swelling when she wears her compression stocking, no worsening of lower leg edema , reports taking medications as prescribed . Patient discussed upcoming visit in next month with Dr.Berry for vascular check up stating her history of having stents in the past.   Encounter Medications:  Outpatient Encounter Medications as of 09/18/2018  Medication Sig  . acetaminophen (TYLENOL) 500 MG tablet Take 1,000 mg by mouth every 6 (six) hours as needed for moderate pain or headache.  [name] amLODipine (NORVASC) 5 MG tablet TAKE 1 TABLET(5 MG) BY MOUTH DAILY (Patient taking differently: Take 5 mg by mouth daily. TAKE 1 TABLET(5 MG) BY MOUTH DAILY)  . aspirin EC 81 MG EC tablet Take 1 tablet (81 mg total) by  mouth daily.  . benzonatate (TESSALON) 100 MG capsule TAKE 1 CAPSULE BY MOUTH THREE TIMES DAILY AS NEEDED FOR COUGH  . brimonidine (ALPHAGAN) 0.15 % ophthalmic solution Place 1 drop into both eyes 2 (two) times daily.  . busPIRone (BUSPAR) 15 MG tablet Take 1 tablet (15 mg total) by mouth 3 (three) times daily.  . carboxymethylcellulose (REFRESH PLUS) 0.5 % SOLN 1 drop daily as needed.  . cholecalciferol (VITAMIN D) 1000 units tablet Take 1,000 Units by mouth daily.  . citalopram (CELEXA) 20 MG tablet Take 1 tablet (20 mg total) by mouth daily.  . clopidogrel (PLAVIX) 75 MG tablet Take 1 tablet (75 mg total) by mouth every morning.  . Cyanocobalamin (VITAMIN B 12 PO) Take 1 tablet by mouth daily.  . diclofenac sodium (VOLTAREN) 1 % GEL Apply 4 g topically 4 (four) times daily. (Patient taking differently: Apply 4 g topically as needed. )  . divalproex (DEPAKOTE) 125 MG DR tablet Take 1 tablet (125 mg total) by mouth daily.  [name] docusate sodium (COLACE) 100 MG capsule Take 100 mg by mouth 2 (two) times daily.   . dorzolamide (TRUSOPT) 2 % ophthalmic solution Place 1 drop into both eyes daily.  . Evolocumab (REPATHA SURECLICK) 474 MG/ML SOAJ Inject 140 mg into the skin every 14 (fourteen) days.  . furosemide (LASIX) 20 MG tablet Take 20 mg by mouth.  . gabapentin (NEURONTIN) 100 MG capsule Take 1 capsule (100 mg total) by mouth 3 (three) times daily. (Patient not taking: Reported on 08/20/2018)  . Latanoprostene Bunod (VYZULTA OP) Apply 1 drop to eye  daily.   . metoprolol tartrate (LOPRESSOR) 25 MG tablet Take 0.5 tablets (12.5 mg total) by mouth 2 (two) times daily. (Patient not taking: Reported on 08/20/2018)  . mirtazapine (REMERON) 15 MG tablet Take 1 tablet (15 mg total) by mouth at bedtime.  . pantoprazole (PROTONIX) 20 MG tablet Take 20 mg by mouth daily.  . pregabalin (LYRICA) 25 MG capsule Take 25 mg by mouth 3 (three) times daily.  [name] PROAIR HFA 108 (90 Base) MCG/ACT inhaler NHALE 2 PUFFS BY  MOUTH FOUR TIMES DAILY AS NEEDED FOR SHORTNESS OF BREATH  . traMADol (ULTRAM) 50 MG tablet Take 1 tablet (50 mg total) by mouth every 12 (twelve) hours as needed. 60 tabs must last one month.   No facility-administered encounter medications on file as of 09/18/2018.     Functional Status:  In your present state of health, do you have any difficulty performing the following activities: 08/20/2018 05/13/2018  Hearing? N N  Vision? Y Y  Difficulty concentrating or making decisions? N N  Walking or climbing stairs? Y Y  Comment using walker  using walker,   Dressing or bathing? N Y  Comment - has home health bath aide   Doing errands, shopping? [name]  Comment daughter assist and [employer] for transportation daughter Engineer, manufacturing systems and eating ? N Y  Using the Toilet? N N  In the past six months, have you accidently leaked urine? Y Y  Comment - wears depends  Do you have problems with loss of bowel control? N N  Managing your Medications? N Y  Managing your Finances? N N  Housekeeping or managing your Housekeeping? [name]  Comment daughter assist  -  Some recent data might be hidden    Fall/Depression Screening: Fall Risk  05/29/2018 05/21/2018 05/07/2018  Falls in the past year? 1 1 Yes  Comment - - -  Number falls in past yr: - 1 1  Injury with Fall? - 1 Yes  Risk Factor Category  - - High Fall Risk  Risk for fall due to : - - History of fall(s)  Follow up - - Falls prevention discussed   PHQ 2/9 Scores 08/15/2018 05/29/2018 05/21/2018 05/07/2018 03/27/2018 03/22/2018 01/02/2018  PHQ - 2 Score 0 1 1 1 6 6  0  PHQ- 9 Score - - - - 20 21 -    Assessment:  Patient denies any new concerns at this time.  Patient will benefit from ongoing education and support for chronic condition of hypertension .  Discussed [facility] health coach program for telephonic disease management patient agreeable , verified patient has [facility] contact number if new concerns arise sooner that contact by health coach.    Plan:  Will close case to [facility] complex care management and transition to Disease management.  Will send PCP discipline closure letter.     Joylene Draft, RN, [place] Management Coordinator  [phone]- Mobile [phone]- Toll Free Main Office

## 2018-09-24 ENCOUNTER — Other Ambulatory Visit: Payer: Self-pay | Admitting: *Deleted

## 2018-10-02 DIAGNOSIS — R300 Dysuria: Secondary | ICD-10-CM | POA: Diagnosis not present

## 2018-10-03 DIAGNOSIS — Z79899 Other long term (current) drug therapy: Secondary | ICD-10-CM | POA: Diagnosis not present

## 2018-10-03 DIAGNOSIS — M5137 Other intervertebral disc degeneration, lumbosacral region: Secondary | ICD-10-CM | POA: Diagnosis not present

## 2018-10-03 DIAGNOSIS — M5440 Lumbago with sciatica, unspecified side: Secondary | ICD-10-CM | POA: Diagnosis not present

## 2018-10-03 DIAGNOSIS — M4800 Spinal stenosis, site unspecified: Secondary | ICD-10-CM | POA: Diagnosis not present

## 2018-10-04 ENCOUNTER — Telehealth: Payer: Self-pay | Admitting: Cardiovascular Disease

## 2018-10-04 NOTE — Telephone Encounter (Signed)
New Message:    Patient call concerning a appt she has coming up. She thought they where cancel. Please call patient back.

## 2018-10-04 NOTE — Telephone Encounter (Signed)
RETURNED CALL TO PT SHE STATES SHE IS NOT SICK SHE WAS JUST WONDERING IF WE WERE GOING TO CANCEL HER APPT. INFORMED PT THAT SOMEONE WILL CALL HER AND DISCUSS HER APPT BEFORE HER APP, MAYBE NEXT WEEK.

## 2018-10-14 ENCOUNTER — Other Ambulatory Visit: Payer: Self-pay | Admitting: *Deleted

## 2018-10-14 DIAGNOSIS — F331 Major depressive disorder, recurrent, moderate: Secondary | ICD-10-CM | POA: Diagnosis not present

## 2018-10-14 NOTE — Patient Outreach (Signed)
[place] [facility]) Care Management  10/14/2018  [name] [name] 1941-02-05 [id number]    RN Health Coach Initial Assessment  Referral Date:  09/19/2018 Referral Source:  Transfer from [place] Reason for Referral:  Continued Disease Management Education Insurance:  [employer]   Outreach Attempt:   Outreach attempt #1 to patient for introduction and initial telephone assessment.  Patient answered and verified HIPAA. RN Health Coach attempted to introduce self and role, but patient repeatedly saying she needs help with her depression.  Stating she is severely depressed and does not understand how to get assistance.  Asked has she contacted her physicians office and she states no.  Also states she does not have a therapist to speak with.  Patient verifies her daughter still stays in the home with her but has not been assisting her.  Offered to assist patient with what was troubling her, she stated she was not sure what the problem was other than her depression.  Stated she did not want to complete initial telephone assessment at this time.  Denies any thought of harming herself.  Agreed to have conference call with primary care office to arrange telehealth appointment.  RN Health Coach contacted Dr. Felipa Emory office [phone]) with patient on the line and spoke with receptionist who arranged 1240 Telehealth appointment for patient today.  Patient acknowledged appointment and verified contact information with providers office.  Plan: RN Health Coach will place [facility] Social Work referral for depression resources. RN Health Coach arranged Telehealth appointment with primary care provider. RN Health Coach will make another outreach attempt to patient to complete initial telephone assessment in the month of April.  [place] [phone] [name]

## 2018-10-15 ENCOUNTER — Other Ambulatory Visit: Payer: Self-pay | Admitting: *Deleted

## 2018-10-15 NOTE — Patient Outreach (Signed)
[place] [facility]) Care Management  10/15/2018  [name] [name] 10/20/1940 [id number]  CSW made initial contact with pt by phone today. Pt's identity was confirmed and CSW introduced self and reason for call. Pt admits to having depression and recent struggles; "I have a lot of anxiety too".  Pt also shared that her PCP has instructed her to start back on the RX she was taking however she is not able to find the name of it to provide. She has not gotten it filled as of yet; states she is waiting for the Pharmacy.  CSW discussed possible ways to relax and slow the anxiety with deep breathing, meditation, etc.  Pt appeared to be somewhat anxious and wanting to go to sleep as she was trying to do so before CSW call. CSW offered to call back another day this week.  Pt denies SI/HI.  CSW will plan a follow up call in 1-2 days.  Eduard Clos, MSW, [place] Worker  [place] [phone]

## 2018-10-15 NOTE — Patient Outreach (Signed)
[place] [facility]) Care Management  10/15/2018  [name] [name] 12/07/1940 [id number]   CSW was advised pt will be followed by [facility] for community case management/support. CSW will communicate along with other [place] [facility] team members to share info and pass patient care to them going forward.    CSW will plan to close CSW referral at this time.   Eduard Clos, MSW, [place] Worker  [place] [phone]

## 2018-10-15 NOTE — Patient Outreach (Signed)
[place] [facility]) Care Management  10/15/2018  [name] [name] 06-08-1941 [id number]   RN Health Coach Case Closure/Transition to [facility]  Referral Date:  09/19/2018 Referral Source:  Transfer from [place] Reason for Referral:  Continued Disease Management Education Insurance:  [employer]   Outreach Attempt:  Successful telephone outreach to patient for case closure and transition to [employer]  HIPAA verified with patient.  Patient reporting she did attend her Telehealth appointment with primary care provider yesterday.  Was restarted on her Divalproex 125 mg twice a day.  Reports she has not been able to attend the [place] due to the [place].  Patient states she has engaged with [facility]  RN Health Coach provided patient with the main number to [facility] [phone]) and encouraged patient to contact them for any needs.  RN Health Coach thanked patient for her participation with [facility].  Plan: RN Health Coach will close [facility] Disease Management case and make patient inactive. RN Health Coach updated [facility] Social Worker on patient's transition to [employer]. RN Health Coach will send primary care provider Case Closure Letter. RN Health Coach will send patient Case Closure Letter. RN Health Coach will send [facility] patient report.  [place] [phone] [email or web address]

## 2018-10-16 ENCOUNTER — Encounter (HOSPITAL_COMMUNITY): Payer: Self-pay

## 2018-10-17 ENCOUNTER — Ambulatory Visit: Payer: Self-pay | Admitting: *Deleted

## 2018-10-18 ENCOUNTER — Encounter (HOSPITAL_COMMUNITY): Payer: PPO

## 2018-10-18 ENCOUNTER — Inpatient Hospital Stay (HOSPITAL_COMMUNITY): Admission: RE | Admit: 2018-10-18 | Payer: PPO | Source: Ambulatory Visit

## 2018-10-22 ENCOUNTER — Ambulatory Visit: Payer: Self-pay | Admitting: *Deleted

## 2018-10-28 ENCOUNTER — Other Ambulatory Visit: Payer: Self-pay | Admitting: Family Medicine

## 2018-10-28 DIAGNOSIS — Z1231 Encounter for screening mammogram for malignant neoplasm of breast: Secondary | ICD-10-CM

## 2018-11-17 DIAGNOSIS — R6520 Severe sepsis without septic shock: Secondary | ICD-10-CM | POA: Diagnosis not present

## 2018-11-17 DIAGNOSIS — J9811 Atelectasis: Secondary | ICD-10-CM | POA: Diagnosis not present

## 2018-11-17 DIAGNOSIS — D696 Thrombocytopenia, unspecified: Secondary | ICD-10-CM | POA: Diagnosis not present

## 2018-11-17 DIAGNOSIS — I129 Hypertensive chronic kidney disease with stage 1 through stage 4 chronic kidney disease, or unspecified chronic kidney disease: Secondary | ICD-10-CM | POA: Diagnosis not present

## 2018-11-17 DIAGNOSIS — R918 Other nonspecific abnormal finding of lung field: Secondary | ICD-10-CM | POA: Diagnosis not present

## 2018-11-17 DIAGNOSIS — J189 Pneumonia, unspecified organism: Secondary | ICD-10-CM | POA: Diagnosis not present

## 2018-11-17 DIAGNOSIS — A419 Sepsis, unspecified organism: Secondary | ICD-10-CM | POA: Diagnosis not present

## 2018-11-17 DIAGNOSIS — R197 Diarrhea, unspecified: Secondary | ICD-10-CM | POA: Diagnosis not present

## 2018-11-17 DIAGNOSIS — R112 Nausea with vomiting, unspecified: Secondary | ICD-10-CM | POA: Diagnosis not present

## 2018-11-17 DIAGNOSIS — J9601 Acute respiratory failure with hypoxia: Secondary | ICD-10-CM | POA: Diagnosis not present

## 2018-11-17 DIAGNOSIS — R109 Unspecified abdominal pain: Secondary | ICD-10-CM | POA: Diagnosis not present

## 2018-11-17 DIAGNOSIS — N179 Acute kidney failure, unspecified: Secondary | ICD-10-CM | POA: Diagnosis not present

## 2018-11-17 DIAGNOSIS — R42 Dizziness and giddiness: Secondary | ICD-10-CM | POA: Diagnosis not present

## 2018-11-17 DIAGNOSIS — Z7982 Long term (current) use of aspirin: Secondary | ICD-10-CM | POA: Diagnosis not present

## 2018-11-17 DIAGNOSIS — E785 Hyperlipidemia, unspecified: Secondary | ICD-10-CM | POA: Diagnosis not present

## 2018-11-17 DIAGNOSIS — Z888 Allergy status to other drugs, medicaments and biological substances status: Secondary | ICD-10-CM | POA: Diagnosis not present

## 2018-11-17 DIAGNOSIS — R0602 Shortness of breath: Secondary | ICD-10-CM | POA: Diagnosis not present

## 2018-11-17 DIAGNOSIS — Z79899 Other long term (current) drug therapy: Secondary | ICD-10-CM | POA: Diagnosis not present

## 2018-11-17 DIAGNOSIS — R000 Tachycardia, unspecified: Secondary | ICD-10-CM | POA: Diagnosis not present

## 2018-11-17 DIAGNOSIS — E872 Acidosis: Secondary | ICD-10-CM | POA: Diagnosis not present

## 2018-11-17 DIAGNOSIS — F319 Bipolar disorder, unspecified: Secondary | ICD-10-CM | POA: Diagnosis not present

## 2018-11-17 DIAGNOSIS — R0902 Hypoxemia: Secondary | ICD-10-CM | POA: Diagnosis not present

## 2018-11-17 DIAGNOSIS — Z940 Kidney transplant status: Secondary | ICD-10-CM | POA: Diagnosis not present

## 2018-11-17 DIAGNOSIS — R404 Transient alteration of awareness: Secondary | ICD-10-CM | POA: Diagnosis not present

## 2018-11-17 DIAGNOSIS — R410 Disorientation, unspecified: Secondary | ICD-10-CM | POA: Diagnosis not present

## 2018-11-17 DIAGNOSIS — E876 Hypokalemia: Secondary | ICD-10-CM | POA: Diagnosis not present

## 2018-11-17 DIAGNOSIS — N183 Chronic kidney disease, stage 3 (moderate): Secondary | ICD-10-CM | POA: Diagnosis not present

## 2018-11-18 DIAGNOSIS — R197 Diarrhea, unspecified: Secondary | ICD-10-CM

## 2018-11-18 DIAGNOSIS — N183 Chronic kidney disease, stage 3 (moderate): Secondary | ICD-10-CM

## 2018-11-18 DIAGNOSIS — R112 Nausea with vomiting, unspecified: Secondary | ICD-10-CM

## 2018-11-22 DIAGNOSIS — Z955 Presence of coronary angioplasty implant and graft: Secondary | ICD-10-CM | POA: Diagnosis not present

## 2018-11-22 DIAGNOSIS — F319 Bipolar disorder, unspecified: Secondary | ICD-10-CM | POA: Diagnosis not present

## 2018-11-22 DIAGNOSIS — D696 Thrombocytopenia, unspecified: Secondary | ICD-10-CM | POA: Diagnosis not present

## 2018-11-22 DIAGNOSIS — Z87440 Personal history of urinary (tract) infections: Secondary | ICD-10-CM | POA: Diagnosis not present

## 2018-11-22 DIAGNOSIS — N183 Chronic kidney disease, stage 3 (moderate): Secondary | ICD-10-CM | POA: Diagnosis not present

## 2018-11-22 DIAGNOSIS — I129 Hypertensive chronic kidney disease with stage 1 through stage 4 chronic kidney disease, or unspecified chronic kidney disease: Secondary | ICD-10-CM | POA: Diagnosis not present

## 2018-11-22 DIAGNOSIS — J189 Pneumonia, unspecified organism: Secondary | ICD-10-CM | POA: Diagnosis not present

## 2018-11-22 DIAGNOSIS — E7800 Pure hypercholesterolemia, unspecified: Secondary | ICD-10-CM | POA: Diagnosis not present

## 2018-11-22 DIAGNOSIS — R197 Diarrhea, unspecified: Secondary | ICD-10-CM | POA: Diagnosis not present

## 2018-11-26 DIAGNOSIS — Z09 Encounter for follow-up examination after completed treatment for conditions other than malignant neoplasm: Secondary | ICD-10-CM | POA: Diagnosis not present

## 2018-11-26 DIAGNOSIS — R062 Wheezing: Secondary | ICD-10-CM | POA: Diagnosis not present

## 2018-11-26 DIAGNOSIS — G629 Polyneuropathy, unspecified: Secondary | ICD-10-CM | POA: Diagnosis not present

## 2018-11-26 DIAGNOSIS — Z6830 Body mass index (BMI) 30.0-30.9, adult: Secondary | ICD-10-CM | POA: Diagnosis not present

## 2018-11-29 ENCOUNTER — Ambulatory Visit (HOSPITAL_COMMUNITY): Payer: PPO

## 2018-11-29 ENCOUNTER — Ambulatory Visit (HOSPITAL_COMMUNITY)
Admission: RE | Admit: 2018-11-29 | Payer: PPO | Source: Ambulatory Visit | Attending: Cardiovascular Disease | Admitting: Cardiovascular Disease

## 2018-12-18 DIAGNOSIS — I129 Hypertensive chronic kidney disease with stage 1 through stage 4 chronic kidney disease, or unspecified chronic kidney disease: Secondary | ICD-10-CM | POA: Diagnosis not present

## 2018-12-18 DIAGNOSIS — N189 Chronic kidney disease, unspecified: Secondary | ICD-10-CM | POA: Diagnosis not present

## 2018-12-18 DIAGNOSIS — D696 Thrombocytopenia, unspecified: Secondary | ICD-10-CM | POA: Diagnosis not present

## 2018-12-22 DIAGNOSIS — Z87440 Personal history of urinary (tract) infections: Secondary | ICD-10-CM | POA: Diagnosis not present

## 2018-12-22 DIAGNOSIS — F319 Bipolar disorder, unspecified: Secondary | ICD-10-CM | POA: Diagnosis not present

## 2018-12-22 DIAGNOSIS — E7800 Pure hypercholesterolemia, unspecified: Secondary | ICD-10-CM | POA: Diagnosis not present

## 2018-12-22 DIAGNOSIS — Z955 Presence of coronary angioplasty implant and graft: Secondary | ICD-10-CM | POA: Diagnosis not present

## 2018-12-22 DIAGNOSIS — D696 Thrombocytopenia, unspecified: Secondary | ICD-10-CM | POA: Diagnosis not present

## 2018-12-22 DIAGNOSIS — R197 Diarrhea, unspecified: Secondary | ICD-10-CM | POA: Diagnosis not present

## 2018-12-22 DIAGNOSIS — N183 Chronic kidney disease, stage 3 (moderate): Secondary | ICD-10-CM | POA: Diagnosis not present

## 2018-12-22 DIAGNOSIS — I129 Hypertensive chronic kidney disease with stage 1 through stage 4 chronic kidney disease, or unspecified chronic kidney disease: Secondary | ICD-10-CM | POA: Diagnosis not present

## 2018-12-22 DIAGNOSIS — J189 Pneumonia, unspecified organism: Secondary | ICD-10-CM | POA: Diagnosis not present

## 2018-12-30 ENCOUNTER — Ambulatory Visit: Payer: Self-pay

## 2019-01-03 ENCOUNTER — Ambulatory Visit (HOSPITAL_COMMUNITY): Payer: PPO

## 2019-01-03 ENCOUNTER — Ambulatory Visit (HOSPITAL_COMMUNITY)
Admission: RE | Admit: 2019-01-03 | Payer: PPO | Source: Ambulatory Visit | Attending: Cardiovascular Disease | Admitting: Cardiovascular Disease

## 2019-01-03 ENCOUNTER — Telehealth: Payer: Self-pay | Admitting: Cardiovascular Disease

## 2019-01-03 ENCOUNTER — Other Ambulatory Visit: Payer: Self-pay | Admitting: Cardiovascular Disease

## 2019-01-03 DIAGNOSIS — I6523 Occlusion and stenosis of bilateral carotid arteries: Secondary | ICD-10-CM

## 2019-01-03 NOTE — Telephone Encounter (Signed)
Patient wanted to call and let MD know that her Vascular test was cancelled by her due to her going to the bathroom all morning long. Advised I would route to MD to make aware.

## 2019-01-03 NOTE — Telephone Encounter (Signed)
OK to resched

## 2019-01-03 NOTE — Telephone Encounter (Signed)
New message:    Patient calling she has been going to the bathroom all day and would like to speak with a nurse.

## 2019-01-06 ENCOUNTER — Telehealth: Payer: Self-pay | Admitting: Cardiovascular Disease

## 2019-01-06 NOTE — Telephone Encounter (Signed)
Spoke with pt who report she was calling to reschedule her Aorta/IVC/ILIACS doppler. Informed pt that Nurse will have a scheduler to reach out to her to arrange testing date. Pt voiced understanding.

## 2019-01-06 NOTE — Telephone Encounter (Signed)
New Message            Patient is calling back to reschedule her test, pls call and advise.

## 2019-01-20 DIAGNOSIS — H26492 Other secondary cataract, left eye: Secondary | ICD-10-CM | POA: Diagnosis not present

## 2019-01-20 DIAGNOSIS — H401133 Primary open-angle glaucoma, bilateral, severe stage: Secondary | ICD-10-CM | POA: Diagnosis not present

## 2019-01-23 DIAGNOSIS — K219 Gastro-esophageal reflux disease without esophagitis: Secondary | ICD-10-CM | POA: Diagnosis not present

## 2019-01-23 DIAGNOSIS — Z6830 Body mass index (BMI) 30.0-30.9, adult: Secondary | ICD-10-CM | POA: Diagnosis not present

## 2019-01-23 DIAGNOSIS — L989 Disorder of the skin and subcutaneous tissue, unspecified: Secondary | ICD-10-CM | POA: Diagnosis not present

## 2019-01-28 DIAGNOSIS — L570 Actinic keratosis: Secondary | ICD-10-CM | POA: Diagnosis not present

## 2019-01-28 DIAGNOSIS — L300 Nummular dermatitis: Secondary | ICD-10-CM | POA: Diagnosis not present

## 2019-01-28 DIAGNOSIS — L820 Inflamed seborrheic keratosis: Secondary | ICD-10-CM | POA: Diagnosis not present

## 2019-01-29 ENCOUNTER — Other Ambulatory Visit (HOSPITAL_COMMUNITY): Payer: Self-pay | Admitting: Cardiovascular Disease

## 2019-01-29 DIAGNOSIS — I739 Peripheral vascular disease, unspecified: Secondary | ICD-10-CM

## 2019-01-29 DIAGNOSIS — I700 Atherosclerosis of aorta: Secondary | ICD-10-CM

## 2019-02-03 ENCOUNTER — Other Ambulatory Visit (HOSPITAL_COMMUNITY): Payer: Self-pay | Admitting: Cardiovascular Disease

## 2019-02-03 ENCOUNTER — Ambulatory Visit (HOSPITAL_BASED_OUTPATIENT_CLINIC_OR_DEPARTMENT_OTHER)
Admission: RE | Admit: 2019-02-03 | Discharge: 2019-02-03 | Disposition: A | Discharge status: Home / Self Care | Payer: PPO | Source: Ambulatory Visit | Attending: Cardiovascular Disease | Admitting: Cardiovascular Disease

## 2019-02-03 ENCOUNTER — Ambulatory Visit (HOSPITAL_COMMUNITY)
Admission: RE | Admit: 2019-02-03 | Discharge: 2019-02-03 | Disposition: A | Discharge status: Home / Self Care | Payer: PPO | Source: Ambulatory Visit | Attending: Cardiology | Admitting: Cardiology

## 2019-02-03 ENCOUNTER — Other Ambulatory Visit: Payer: Self-pay

## 2019-02-03 ENCOUNTER — Ambulatory Visit (HOSPITAL_BASED_OUTPATIENT_CLINIC_OR_DEPARTMENT_OTHER)
Admission: RE | Admit: 2019-02-03 | Discharge: 2019-02-03 | Disposition: A | Discharge status: Home / Self Care | Payer: PPO | Source: Ambulatory Visit | Attending: Cardiology | Admitting: Cardiology

## 2019-02-03 DIAGNOSIS — I700 Atherosclerosis of aorta: Secondary | ICD-10-CM

## 2019-02-03 DIAGNOSIS — I739 Peripheral vascular disease, unspecified: Secondary | ICD-10-CM

## 2019-02-03 DIAGNOSIS — I6523 Occlusion and stenosis of bilateral carotid arteries: Secondary | ICD-10-CM

## 2019-02-03 DIAGNOSIS — Z95828 Presence of other vascular implants and grafts: Secondary | ICD-10-CM

## 2019-02-12 ENCOUNTER — Other Ambulatory Visit: Payer: Self-pay

## 2019-02-12 DIAGNOSIS — I6523 Occlusion and stenosis of bilateral carotid arteries: Secondary | ICD-10-CM

## 2019-03-06 DIAGNOSIS — E7800 Pure hypercholesterolemia, unspecified: Secondary | ICD-10-CM | POA: Diagnosis not present

## 2019-03-06 DIAGNOSIS — M79631 Pain in right forearm: Secondary | ICD-10-CM | POA: Diagnosis not present

## 2019-03-06 DIAGNOSIS — N3090 Cystitis, unspecified without hematuria: Secondary | ICD-10-CM | POA: Diagnosis not present

## 2019-03-06 DIAGNOSIS — G629 Polyneuropathy, unspecified: Secondary | ICD-10-CM | POA: Diagnosis not present

## 2019-03-06 DIAGNOSIS — D7589 Other specified diseases of blood and blood-forming organs: Secondary | ICD-10-CM | POA: Diagnosis not present

## 2019-03-06 DIAGNOSIS — G3184 Mild cognitive impairment, so stated: Secondary | ICD-10-CM | POA: Diagnosis not present

## 2019-03-06 DIAGNOSIS — F323 Major depressive disorder, single episode, severe with psychotic features: Secondary | ICD-10-CM | POA: Diagnosis not present

## 2019-03-06 DIAGNOSIS — I10 Essential (primary) hypertension: Secondary | ICD-10-CM | POA: Diagnosis not present

## 2019-03-06 DIAGNOSIS — Z03818 Encounter for observation for suspected exposure to other biological agents ruled out: Secondary | ICD-10-CM | POA: Diagnosis not present

## 2019-03-06 DIAGNOSIS — R45851 Suicidal ideations: Secondary | ICD-10-CM | POA: Diagnosis not present

## 2019-03-06 DIAGNOSIS — M11061 Hydroxyapatite deposition disease, right knee: Secondary | ICD-10-CM | POA: Diagnosis not present

## 2019-03-06 DIAGNOSIS — I959 Hypotension, unspecified: Secondary | ICD-10-CM | POA: Diagnosis not present

## 2019-03-06 DIAGNOSIS — F332 Major depressive disorder, recurrent severe without psychotic features: Secondary | ICD-10-CM | POA: Diagnosis not present

## 2019-03-06 DIAGNOSIS — Z888 Allergy status to other drugs, medicaments and biological substances status: Secondary | ICD-10-CM | POA: Diagnosis not present

## 2019-03-06 DIAGNOSIS — Z79899 Other long term (current) drug therapy: Secondary | ICD-10-CM | POA: Diagnosis not present

## 2019-03-06 DIAGNOSIS — M25462 Effusion, left knee: Secondary | ICD-10-CM | POA: Diagnosis not present

## 2019-03-06 DIAGNOSIS — D649 Anemia, unspecified: Secondary | ICD-10-CM | POA: Diagnosis not present

## 2019-03-06 DIAGNOSIS — R0902 Hypoxemia: Secondary | ICD-10-CM | POA: Diagnosis not present

## 2019-03-06 DIAGNOSIS — F333 Major depressive disorder, recurrent, severe with psychotic symptoms: Secondary | ICD-10-CM | POA: Diagnosis not present

## 2019-03-06 DIAGNOSIS — M419 Scoliosis, unspecified: Secondary | ICD-10-CM | POA: Diagnosis not present

## 2019-03-06 DIAGNOSIS — S41112A Laceration without foreign body of left upper arm, initial encounter: Secondary | ICD-10-CM | POA: Diagnosis not present

## 2019-03-06 DIAGNOSIS — M25562 Pain in left knee: Secondary | ICD-10-CM | POA: Diagnosis not present

## 2019-03-06 DIAGNOSIS — D539 Nutritional anemia, unspecified: Secondary | ICD-10-CM | POA: Diagnosis not present

## 2019-03-06 DIAGNOSIS — S41111A Laceration without foreign body of right upper arm, initial encounter: Secondary | ICD-10-CM | POA: Diagnosis not present

## 2019-03-06 DIAGNOSIS — S61419A Laceration without foreign body of unspecified hand, initial encounter: Secondary | ICD-10-CM | POA: Diagnosis not present

## 2019-03-06 DIAGNOSIS — M542 Cervicalgia: Secondary | ICD-10-CM | POA: Diagnosis not present

## 2019-03-06 DIAGNOSIS — J449 Chronic obstructive pulmonary disease, unspecified: Secondary | ICD-10-CM | POA: Diagnosis not present

## 2019-03-06 DIAGNOSIS — Z7902 Long term (current) use of antithrombotics/antiplatelets: Secondary | ICD-10-CM | POA: Diagnosis not present

## 2019-03-06 DIAGNOSIS — R251 Tremor, unspecified: Secondary | ICD-10-CM | POA: Diagnosis not present

## 2019-03-06 DIAGNOSIS — F418 Other specified anxiety disorders: Secondary | ICD-10-CM | POA: Diagnosis not present

## 2019-03-06 DIAGNOSIS — M11062 Hydroxyapatite deposition disease, left knee: Secondary | ICD-10-CM | POA: Diagnosis not present

## 2019-03-06 DIAGNOSIS — E876 Hypokalemia: Secondary | ICD-10-CM | POA: Diagnosis not present

## 2019-03-06 DIAGNOSIS — R197 Diarrhea, unspecified: Secondary | ICD-10-CM | POA: Diagnosis not present

## 2019-03-06 DIAGNOSIS — J9691 Respiratory failure, unspecified with hypoxia: Secondary | ICD-10-CM | POA: Diagnosis not present

## 2019-03-06 DIAGNOSIS — I739 Peripheral vascular disease, unspecified: Secondary | ICD-10-CM | POA: Diagnosis not present

## 2019-03-06 DIAGNOSIS — Z87891 Personal history of nicotine dependence: Secondary | ICD-10-CM | POA: Diagnosis not present

## 2019-03-06 DIAGNOSIS — R531 Weakness: Secondary | ICD-10-CM | POA: Diagnosis not present

## 2019-03-06 DIAGNOSIS — R410 Disorientation, unspecified: Secondary | ICD-10-CM | POA: Diagnosis not present

## 2019-03-14 DIAGNOSIS — D649 Anemia, unspecified: Secondary | ICD-10-CM | POA: Diagnosis not present

## 2019-03-14 DIAGNOSIS — M25562 Pain in left knee: Secondary | ICD-10-CM | POA: Diagnosis not present

## 2019-03-14 DIAGNOSIS — M11862 Other specified crystal arthropathies, left knee: Secondary | ICD-10-CM | POA: Diagnosis not present

## 2019-03-14 DIAGNOSIS — I739 Peripheral vascular disease, unspecified: Secondary | ICD-10-CM | POA: Diagnosis not present

## 2019-03-14 DIAGNOSIS — N3090 Cystitis, unspecified without hematuria: Secondary | ICD-10-CM | POA: Diagnosis not present

## 2019-03-14 DIAGNOSIS — R410 Disorientation, unspecified: Secondary | ICD-10-CM | POA: Diagnosis not present

## 2019-03-14 DIAGNOSIS — R45851 Suicidal ideations: Secondary | ICD-10-CM | POA: Diagnosis not present

## 2019-03-14 DIAGNOSIS — R197 Diarrhea, unspecified: Secondary | ICD-10-CM | POA: Diagnosis not present

## 2019-03-14 DIAGNOSIS — Z7902 Long term (current) use of antithrombotics/antiplatelets: Secondary | ICD-10-CM | POA: Diagnosis not present

## 2019-03-14 DIAGNOSIS — M25462 Effusion, left knee: Secondary | ICD-10-CM | POA: Diagnosis not present

## 2019-03-14 DIAGNOSIS — M11062 Hydroxyapatite deposition disease, left knee: Secondary | ICD-10-CM | POA: Diagnosis not present

## 2019-03-14 DIAGNOSIS — E7800 Pure hypercholesterolemia, unspecified: Secondary | ICD-10-CM | POA: Diagnosis not present

## 2019-03-14 DIAGNOSIS — M11061 Hydroxyapatite deposition disease, right knee: Secondary | ICD-10-CM | POA: Diagnosis not present

## 2019-03-14 DIAGNOSIS — F333 Major depressive disorder, recurrent, severe with psychotic symptoms: Secondary | ICD-10-CM | POA: Diagnosis not present

## 2019-03-14 DIAGNOSIS — D539 Nutritional anemia, unspecified: Secondary | ICD-10-CM | POA: Diagnosis not present

## 2019-03-14 DIAGNOSIS — I10 Essential (primary) hypertension: Secondary | ICD-10-CM | POA: Diagnosis not present

## 2019-03-14 DIAGNOSIS — G629 Polyneuropathy, unspecified: Secondary | ICD-10-CM | POA: Diagnosis not present

## 2019-03-14 DIAGNOSIS — S41112A Laceration without foreign body of left upper arm, initial encounter: Secondary | ICD-10-CM | POA: Diagnosis not present

## 2019-03-14 DIAGNOSIS — I509 Heart failure, unspecified: Secondary | ICD-10-CM | POA: Diagnosis not present

## 2019-03-14 DIAGNOSIS — S41111A Laceration without foreign body of right upper arm, initial encounter: Secondary | ICD-10-CM | POA: Diagnosis not present

## 2019-03-14 DIAGNOSIS — D7589 Other specified diseases of blood and blood-forming organs: Secondary | ICD-10-CM | POA: Diagnosis not present

## 2019-03-15 DIAGNOSIS — I509 Heart failure, unspecified: Secondary | ICD-10-CM | POA: Diagnosis not present

## 2019-03-29 DIAGNOSIS — M353 Polymyalgia rheumatica: Secondary | ICD-10-CM | POA: Diagnosis not present

## 2019-03-29 DIAGNOSIS — D649 Anemia, unspecified: Secondary | ICD-10-CM | POA: Diagnosis not present

## 2019-03-29 DIAGNOSIS — I739 Peripheral vascular disease, unspecified: Secondary | ICD-10-CM | POA: Diagnosis not present

## 2019-03-29 DIAGNOSIS — H524 Presbyopia: Secondary | ICD-10-CM | POA: Diagnosis not present

## 2019-03-29 DIAGNOSIS — Z7982 Long term (current) use of aspirin: Secondary | ICD-10-CM | POA: Diagnosis not present

## 2019-03-29 DIAGNOSIS — Z7902 Long term (current) use of antithrombotics/antiplatelets: Secondary | ICD-10-CM | POA: Diagnosis not present

## 2019-03-29 DIAGNOSIS — F332 Major depressive disorder, recurrent severe without psychotic features: Secondary | ICD-10-CM | POA: Diagnosis not present

## 2019-03-29 DIAGNOSIS — I10 Essential (primary) hypertension: Secondary | ICD-10-CM | POA: Diagnosis not present

## 2019-03-29 DIAGNOSIS — F419 Anxiety disorder, unspecified: Secondary | ICD-10-CM | POA: Diagnosis not present

## 2019-03-29 DIAGNOSIS — K449 Diaphragmatic hernia without obstruction or gangrene: Secondary | ICD-10-CM | POA: Diagnosis not present

## 2019-03-29 DIAGNOSIS — N289 Disorder of kidney and ureter, unspecified: Secondary | ICD-10-CM | POA: Diagnosis not present

## 2019-03-29 DIAGNOSIS — M810 Age-related osteoporosis without current pathological fracture: Secondary | ICD-10-CM | POA: Diagnosis not present

## 2019-03-29 DIAGNOSIS — M533 Sacrococcygeal disorders, not elsewhere classified: Secondary | ICD-10-CM | POA: Diagnosis not present

## 2019-03-29 DIAGNOSIS — K5790 Diverticulosis of intestine, part unspecified, without perforation or abscess without bleeding: Secondary | ICD-10-CM | POA: Diagnosis not present

## 2019-03-29 DIAGNOSIS — K219 Gastro-esophageal reflux disease without esophagitis: Secondary | ICD-10-CM | POA: Diagnosis not present

## 2019-03-29 DIAGNOSIS — G629 Polyneuropathy, unspecified: Secondary | ICD-10-CM | POA: Diagnosis not present

## 2019-03-29 DIAGNOSIS — E7800 Pure hypercholesterolemia, unspecified: Secondary | ICD-10-CM | POA: Diagnosis not present

## 2019-03-29 DIAGNOSIS — M5416 Radiculopathy, lumbar region: Secondary | ICD-10-CM | POA: Diagnosis not present

## 2019-03-29 DIAGNOSIS — Z7952 Long term (current) use of systemic steroids: Secondary | ICD-10-CM | POA: Diagnosis not present

## 2019-03-29 DIAGNOSIS — Z87891 Personal history of nicotine dependence: Secondary | ICD-10-CM | POA: Diagnosis not present

## 2019-03-29 DIAGNOSIS — Z85828 Personal history of other malignant neoplasm of skin: Secondary | ICD-10-CM | POA: Diagnosis not present

## 2019-03-29 DIAGNOSIS — Z9181 History of falling: Secondary | ICD-10-CM | POA: Diagnosis not present

## 2019-03-29 DIAGNOSIS — H409 Unspecified glaucoma: Secondary | ICD-10-CM | POA: Diagnosis not present

## 2019-03-29 DIAGNOSIS — M47892 Other spondylosis, cervical region: Secondary | ICD-10-CM | POA: Diagnosis not present

## 2019-04-02 DIAGNOSIS — D649 Anemia, unspecified: Secondary | ICD-10-CM | POA: Diagnosis not present

## 2019-04-02 DIAGNOSIS — M25561 Pain in right knee: Secondary | ICD-10-CM | POA: Diagnosis not present

## 2019-04-02 DIAGNOSIS — Z6828 Body mass index (BMI) 28.0-28.9, adult: Secondary | ICD-10-CM | POA: Diagnosis not present

## 2019-04-02 DIAGNOSIS — Z79899 Other long term (current) drug therapy: Secondary | ICD-10-CM | POA: Diagnosis not present

## 2019-04-02 DIAGNOSIS — M25562 Pain in left knee: Secondary | ICD-10-CM | POA: Diagnosis not present

## 2019-04-02 DIAGNOSIS — I10 Essential (primary) hypertension: Secondary | ICD-10-CM | POA: Diagnosis not present

## 2019-04-11 DIAGNOSIS — Z7952 Long term (current) use of systemic steroids: Secondary | ICD-10-CM | POA: Diagnosis not present

## 2019-04-11 DIAGNOSIS — F332 Major depressive disorder, recurrent severe without psychotic features: Secondary | ICD-10-CM | POA: Diagnosis not present

## 2019-04-11 DIAGNOSIS — H524 Presbyopia: Secondary | ICD-10-CM | POA: Diagnosis not present

## 2019-04-11 DIAGNOSIS — M47892 Other spondylosis, cervical region: Secondary | ICD-10-CM | POA: Diagnosis not present

## 2019-04-11 DIAGNOSIS — N289 Disorder of kidney and ureter, unspecified: Secondary | ICD-10-CM | POA: Diagnosis not present

## 2019-04-11 DIAGNOSIS — M5416 Radiculopathy, lumbar region: Secondary | ICD-10-CM | POA: Diagnosis not present

## 2019-04-11 DIAGNOSIS — I10 Essential (primary) hypertension: Secondary | ICD-10-CM | POA: Diagnosis not present

## 2019-04-11 DIAGNOSIS — Z7982 Long term (current) use of aspirin: Secondary | ICD-10-CM | POA: Diagnosis not present

## 2019-04-11 DIAGNOSIS — H409 Unspecified glaucoma: Secondary | ICD-10-CM | POA: Diagnosis not present

## 2019-04-11 DIAGNOSIS — Z7902 Long term (current) use of antithrombotics/antiplatelets: Secondary | ICD-10-CM | POA: Diagnosis not present

## 2019-04-11 DIAGNOSIS — K449 Diaphragmatic hernia without obstruction or gangrene: Secondary | ICD-10-CM | POA: Diagnosis not present

## 2019-04-11 DIAGNOSIS — E7800 Pure hypercholesterolemia, unspecified: Secondary | ICD-10-CM | POA: Diagnosis not present

## 2019-04-11 DIAGNOSIS — D649 Anemia, unspecified: Secondary | ICD-10-CM | POA: Diagnosis not present

## 2019-04-11 DIAGNOSIS — M353 Polymyalgia rheumatica: Secondary | ICD-10-CM | POA: Diagnosis not present

## 2019-04-11 DIAGNOSIS — Z87891 Personal history of nicotine dependence: Secondary | ICD-10-CM | POA: Diagnosis not present

## 2019-04-11 DIAGNOSIS — K219 Gastro-esophageal reflux disease without esophagitis: Secondary | ICD-10-CM | POA: Diagnosis not present

## 2019-04-11 DIAGNOSIS — F419 Anxiety disorder, unspecified: Secondary | ICD-10-CM | POA: Diagnosis not present

## 2019-04-11 DIAGNOSIS — K5790 Diverticulosis of intestine, part unspecified, without perforation or abscess without bleeding: Secondary | ICD-10-CM | POA: Diagnosis not present

## 2019-04-11 DIAGNOSIS — I739 Peripheral vascular disease, unspecified: Secondary | ICD-10-CM | POA: Diagnosis not present

## 2019-04-11 DIAGNOSIS — Z85828 Personal history of other malignant neoplasm of skin: Secondary | ICD-10-CM | POA: Diagnosis not present

## 2019-04-11 DIAGNOSIS — G629 Polyneuropathy, unspecified: Secondary | ICD-10-CM | POA: Diagnosis not present

## 2019-04-11 DIAGNOSIS — M533 Sacrococcygeal disorders, not elsewhere classified: Secondary | ICD-10-CM | POA: Diagnosis not present

## 2019-04-11 DIAGNOSIS — Z9181 History of falling: Secondary | ICD-10-CM | POA: Diagnosis not present

## 2019-04-11 DIAGNOSIS — M810 Age-related osteoporosis without current pathological fracture: Secondary | ICD-10-CM | POA: Diagnosis not present

## 2019-04-17 ENCOUNTER — Encounter: Payer: Self-pay | Admitting: Gynecology

## 2019-04-17 DIAGNOSIS — R251 Tremor, unspecified: Secondary | ICD-10-CM | POA: Diagnosis not present

## 2019-04-17 DIAGNOSIS — R410 Disorientation, unspecified: Secondary | ICD-10-CM | POA: Diagnosis not present

## 2019-04-28 DIAGNOSIS — M5416 Radiculopathy, lumbar region: Secondary | ICD-10-CM | POA: Diagnosis not present

## 2019-04-28 DIAGNOSIS — Z87891 Personal history of nicotine dependence: Secondary | ICD-10-CM | POA: Diagnosis not present

## 2019-04-28 DIAGNOSIS — Z9181 History of falling: Secondary | ICD-10-CM | POA: Diagnosis not present

## 2019-04-28 DIAGNOSIS — K219 Gastro-esophageal reflux disease without esophagitis: Secondary | ICD-10-CM | POA: Diagnosis not present

## 2019-04-28 DIAGNOSIS — F419 Anxiety disorder, unspecified: Secondary | ICD-10-CM | POA: Diagnosis not present

## 2019-04-28 DIAGNOSIS — G629 Polyneuropathy, unspecified: Secondary | ICD-10-CM | POA: Diagnosis not present

## 2019-04-28 DIAGNOSIS — K449 Diaphragmatic hernia without obstruction or gangrene: Secondary | ICD-10-CM | POA: Diagnosis not present

## 2019-04-28 DIAGNOSIS — M810 Age-related osteoporosis without current pathological fracture: Secondary | ICD-10-CM | POA: Diagnosis not present

## 2019-04-28 DIAGNOSIS — Z7902 Long term (current) use of antithrombotics/antiplatelets: Secondary | ICD-10-CM | POA: Diagnosis not present

## 2019-04-28 DIAGNOSIS — Z85828 Personal history of other malignant neoplasm of skin: Secondary | ICD-10-CM | POA: Diagnosis not present

## 2019-04-28 DIAGNOSIS — M353 Polymyalgia rheumatica: Secondary | ICD-10-CM | POA: Diagnosis not present

## 2019-04-28 DIAGNOSIS — H409 Unspecified glaucoma: Secondary | ICD-10-CM | POA: Diagnosis not present

## 2019-04-28 DIAGNOSIS — E7800 Pure hypercholesterolemia, unspecified: Secondary | ICD-10-CM | POA: Diagnosis not present

## 2019-04-28 DIAGNOSIS — D649 Anemia, unspecified: Secondary | ICD-10-CM | POA: Diagnosis not present

## 2019-04-28 DIAGNOSIS — I739 Peripheral vascular disease, unspecified: Secondary | ICD-10-CM | POA: Diagnosis not present

## 2019-04-28 DIAGNOSIS — K5790 Diverticulosis of intestine, part unspecified, without perforation or abscess without bleeding: Secondary | ICD-10-CM | POA: Diagnosis not present

## 2019-04-28 DIAGNOSIS — Z7952 Long term (current) use of systemic steroids: Secondary | ICD-10-CM | POA: Diagnosis not present

## 2019-04-28 DIAGNOSIS — H524 Presbyopia: Secondary | ICD-10-CM | POA: Diagnosis not present

## 2019-04-28 DIAGNOSIS — M47892 Other spondylosis, cervical region: Secondary | ICD-10-CM | POA: Diagnosis not present

## 2019-04-28 DIAGNOSIS — M533 Sacrococcygeal disorders, not elsewhere classified: Secondary | ICD-10-CM | POA: Diagnosis not present

## 2019-04-28 DIAGNOSIS — N289 Disorder of kidney and ureter, unspecified: Secondary | ICD-10-CM | POA: Diagnosis not present

## 2019-04-28 DIAGNOSIS — Z7982 Long term (current) use of aspirin: Secondary | ICD-10-CM | POA: Diagnosis not present

## 2019-04-28 DIAGNOSIS — F332 Major depressive disorder, recurrent severe without psychotic features: Secondary | ICD-10-CM | POA: Diagnosis not present

## 2019-04-28 DIAGNOSIS — I10 Essential (primary) hypertension: Secondary | ICD-10-CM | POA: Diagnosis not present

## 2019-04-30 DIAGNOSIS — H401133 Primary open-angle glaucoma, bilateral, severe stage: Secondary | ICD-10-CM | POA: Diagnosis not present

## 2019-05-12 ENCOUNTER — Other Ambulatory Visit: Payer: Self-pay

## 2019-05-12 DIAGNOSIS — E7800 Pure hypercholesterolemia, unspecified: Secondary | ICD-10-CM

## 2019-05-15 DIAGNOSIS — M533 Sacrococcygeal disorders, not elsewhere classified: Secondary | ICD-10-CM | POA: Diagnosis not present

## 2019-05-15 DIAGNOSIS — Z7952 Long term (current) use of systemic steroids: Secondary | ICD-10-CM | POA: Diagnosis not present

## 2019-05-15 DIAGNOSIS — Z87891 Personal history of nicotine dependence: Secondary | ICD-10-CM | POA: Diagnosis not present

## 2019-05-15 DIAGNOSIS — G629 Polyneuropathy, unspecified: Secondary | ICD-10-CM | POA: Diagnosis not present

## 2019-05-15 DIAGNOSIS — M810 Age-related osteoporosis without current pathological fracture: Secondary | ICD-10-CM | POA: Diagnosis not present

## 2019-05-15 DIAGNOSIS — K449 Diaphragmatic hernia without obstruction or gangrene: Secondary | ICD-10-CM | POA: Diagnosis not present

## 2019-05-15 DIAGNOSIS — Z7902 Long term (current) use of antithrombotics/antiplatelets: Secondary | ICD-10-CM | POA: Diagnosis not present

## 2019-05-15 DIAGNOSIS — F419 Anxiety disorder, unspecified: Secondary | ICD-10-CM | POA: Diagnosis not present

## 2019-05-15 DIAGNOSIS — H409 Unspecified glaucoma: Secondary | ICD-10-CM | POA: Diagnosis not present

## 2019-05-15 DIAGNOSIS — I10 Essential (primary) hypertension: Secondary | ICD-10-CM | POA: Diagnosis not present

## 2019-05-15 DIAGNOSIS — H524 Presbyopia: Secondary | ICD-10-CM | POA: Diagnosis not present

## 2019-05-15 DIAGNOSIS — M353 Polymyalgia rheumatica: Secondary | ICD-10-CM | POA: Diagnosis not present

## 2019-05-15 DIAGNOSIS — K5790 Diverticulosis of intestine, part unspecified, without perforation or abscess without bleeding: Secondary | ICD-10-CM | POA: Diagnosis not present

## 2019-05-15 DIAGNOSIS — Z85828 Personal history of other malignant neoplasm of skin: Secondary | ICD-10-CM | POA: Diagnosis not present

## 2019-05-15 DIAGNOSIS — E7800 Pure hypercholesterolemia, unspecified: Secondary | ICD-10-CM | POA: Diagnosis not present

## 2019-05-15 DIAGNOSIS — M47892 Other spondylosis, cervical region: Secondary | ICD-10-CM | POA: Diagnosis not present

## 2019-05-15 DIAGNOSIS — Z7982 Long term (current) use of aspirin: Secondary | ICD-10-CM | POA: Diagnosis not present

## 2019-05-15 DIAGNOSIS — Z9181 History of falling: Secondary | ICD-10-CM | POA: Diagnosis not present

## 2019-05-15 DIAGNOSIS — K219 Gastro-esophageal reflux disease without esophagitis: Secondary | ICD-10-CM | POA: Diagnosis not present

## 2019-05-15 DIAGNOSIS — I739 Peripheral vascular disease, unspecified: Secondary | ICD-10-CM | POA: Diagnosis not present

## 2019-05-15 DIAGNOSIS — M5416 Radiculopathy, lumbar region: Secondary | ICD-10-CM | POA: Diagnosis not present

## 2019-05-15 DIAGNOSIS — N289 Disorder of kidney and ureter, unspecified: Secondary | ICD-10-CM | POA: Diagnosis not present

## 2019-05-15 DIAGNOSIS — F332 Major depressive disorder, recurrent severe without psychotic features: Secondary | ICD-10-CM | POA: Diagnosis not present

## 2019-05-15 DIAGNOSIS — D649 Anemia, unspecified: Secondary | ICD-10-CM | POA: Diagnosis not present

## 2019-05-23 ENCOUNTER — Telehealth: Payer: Self-pay

## 2019-05-23 NOTE — Telephone Encounter (Signed)

## 2019-05-26 ENCOUNTER — Encounter: Payer: Self-pay | Admitting: Family Medicine

## 2019-05-26 ENCOUNTER — Ambulatory Visit (INDEPENDENT_AMBULATORY_CARE_PROVIDER_SITE_OTHER): Payer: PPO | Admitting: Family Medicine

## 2019-05-26 DIAGNOSIS — D631 Anemia in chronic kidney disease: Secondary | ICD-10-CM | POA: Diagnosis not present

## 2019-05-26 DIAGNOSIS — M25562 Pain in left knee: Secondary | ICD-10-CM

## 2019-05-26 DIAGNOSIS — N183 Chronic kidney disease, stage 3 unspecified: Secondary | ICD-10-CM

## 2019-05-26 DIAGNOSIS — F329 Major depressive disorder, single episode, unspecified: Secondary | ICD-10-CM

## 2019-05-26 DIAGNOSIS — Q600 Renal agenesis, unilateral: Secondary | ICD-10-CM | POA: Diagnosis not present

## 2019-05-26 DIAGNOSIS — G8929 Other chronic pain: Secondary | ICD-10-CM

## 2019-05-26 DIAGNOSIS — F32A Depression, unspecified: Secondary | ICD-10-CM

## 2019-05-26 DIAGNOSIS — M25462 Effusion, left knee: Secondary | ICD-10-CM | POA: Diagnosis not present

## 2019-05-26 DIAGNOSIS — F419 Anxiety disorder, unspecified: Secondary | ICD-10-CM

## 2019-05-26 NOTE — Progress Notes (Signed)
Virtual Visit via Telephone Note  I connected with [name] [name]  on 05/26/19 at  2:50 PM EST by telephone and verified that I am speaking with the correct person using two identifiers.   I discussed the limitations, risks, security and privacy concerns of performing an evaluation and management service by telephone and the availability of in person appointments. I also discussed with the patient that there may be a patient responsible charge related to this service. The patient expressed understanding and agreed to proceed.  Patient Location: Home Provider Location: Home Office Others participating in call: none   History of Present Illness:        78 yo female new to the practice, who reports that one of her daughters is currently a patient here.  Patient states that she recently moved back to [place] after living in [place] with her other daughter for about 2 years.  Patient reports that recently she has had recurrent falls.  She reports also that for about the past 3 weeks she has had increased pain and swelling in her left knee.  Pain is generally at about a 6 on a 0-to-10 scale but sometimes if she has been sitting still or if she has been sleeping and then tries to get up she has a sharp pain that is about a 10 on a 0-to-10 scale when she initially tries to bear weight on her left knee.  She reports that earlier this year she was in the hospital and had a fall during her hospitalization with swelling of the knee occurring after her fall and she stated that she had to have a procedure to draw fluid off of her knee.  She believes that she has had increased issues with her knee since her fall earlier in the year.  She was also receiving some home physical therapy for a while and she states that this initially helped but now the knee pain has returned along with swelling of her knee.  She feels as if there is a knot on her left knee.  She also feels at times that her knee is warm as if there is  a fever in her knee.  She denies any increased redness.  She reports that now she has to use her walker all the time.  She still has her driver's license but in general does not drive because she has poor vision.         She reports that earlier this year, she was hospitalized due to changes in mental status.  Patient states that her daughter tried to convince the doctors that patient was crazy.  Patient states that she is not crazy and it was eventually discovered that she had a very low blood count/anemia and she received blood transfusion.  She is currently taking an iron supplement.  She states that she felt much better after receiving blood.  She states that she was told during her hospitalization that she also had chronic kidney disease.  She is concerned regarding chronic kidney disease because she also only has 1 kidney.  She states that she was told that after having imaging in the past that only 1 kidney could be visualized.  She reports that she is taking her blood pressure medication daily and checks her blood pressure and she feels that this has been controlled on her current medicines.  She has seen one of her specialist recently and believes that she had blood work done within the past 2 weeks  but she cannot recall exactly which specialist she saw.  She also has issues with her vision and has been seeing her eye doctor on a regular basis.  She does report some issues with anxiety and depression but feels that this is improved now that she has moved away from the daughter that she was staying with.  She states that she found out that her daughter was engaging in drug use and that living with her daughter was not a good environment for the patient.   Past Medical History:  Diagnosis Date  . Anxiety   . Arthritis    RA  . Carpal tunnel syndrome, bilateral   . Chronic kidney disease    CKD stage III, absence of left kidney  . Congenital absence of one kidney    Pt has right kidney only  .  Depression   . GERD (gastroesophageal reflux disease)   . Headache   . Heart murmur   . Hypercholesteremia   . Hypertension   . Peripheral vascular disease ([place])    a. s/p R external iliac stent '06 with angioplasty '13 (Dr. Benjamine Sprague) b. 01/2016: s/p PTA/stenting in L common iliac artery (Dr. Gwenlyn Found)  . Polymyalgia rheumatica ([place])   . Psoriasis     Past Surgical History:  Procedure Laterality Date  . ABDOMINAL HYSTERECTOMY  1963   Partial,  Due to bleeding after delivery  . ANTERIOR CERVICAL DECOMP/DISCECTOMY FUSION  04/19/2012   Procedure: ANTERIOR CERVICAL DECOMPRESSION/DISCECTOMY FUSION 2 LEVELS;  Surgeon: Winfield Cunas, MD;  Location: [facility];  Service: Neurosurgery;  Laterality: N/A;  Cervical four-five,Cervical five-six anterior cervical decompression with fusion plating and bonegraft possible posterior cervical decompression  . APPENDECTOMY    . CARPAL TUNNEL RELEASE  1990  . CERVICAL FUSION  04/19/2012  . EYE SURGERY    . ILIAC ARTERY STENT    . PERIPHERAL VASCULAR CATHETERIZATION N/A 01/31/2016   Procedure: Abdominal Aortogram;  Surgeon: [name], MD;  Location: [facility];  Service: Cardiovascular;  Laterality: N/A;  . PERIPHERAL VASCULAR CATHETERIZATION Bilateral 01/31/2016   Procedure: Lower Extremity Angiography;  Surgeon: [name], MD;  Location: [facility];  Service: Cardiovascular;  Laterality: Bilateral;  . PERIPHERAL VASCULAR CATHETERIZATION Left 01/31/2016   Procedure: Peripheral Vascular Intervention;  Surgeon: [name], MD;  Location: [facility];  Service: Cardiovascular;  Laterality: Left;  ILIAC  . TONSILLECTOMY      Family History  Problem Relation Age of Onset  . Angina Mother   . Heart attack Father   . Melanoma Brother   . Cancer Sister        Lymphoma    Social History   Tobacco Use  . Smoking status: Former Smoker    Packs/day: 1.00    Years: 40.00    Pack years: 40.00    Types: Cigarettes     Quit date: 07/24/2010    Years since quitting: 8.8  . Smokeless tobacco: Never Used  . Tobacco comment: STARTED BACK JULY 2013 AND RECENTLY QUIT 03-10-2012  Substance Use Topics  . Alcohol use: No    Alcohol/week: 0.0 standard drinks    Comment: QUIT IN 1999  . Drug use: No     Allergies  Allergen Reactions  . Gabapentin Itching    Was deleted since patient was taking, but now experiencing itching again.   . Statins Swelling, Rash and Other (See Comments)    Swelling involving tongue; also causes muscle pain  Observations/Objective: No vital signs or physical exam conducted as visit was done via telephone  Assessment and Plan: 1. Chronic pain of left knee 2. Swelling of left knee joint She reports acute on chronic pain and swelling of the left knee as well as prior fall causing knee pain and requiring aspiration of fluid from the knee.  She is encouraged to take over-the-counter Tylenol as needed for knee pain and avoid use of nonsteroidal anti-inflammatories due to chronic kidney disease and having only 1 functioning kidney.  She will be referred to orthopedics for further evaluation and treatment. - AMB referral to orthopedics  3. Stage 3 chronic kidney disease, unspecified whether stage 3a or 3b CKD; 5.  Congenital absence of 1 kidney; 7.  Essential hypertension She reports that she was told that she has stage III chronic kidney disease.  I reviewed her chart, her most recent creatinine was 0.93 on 04/08/2018.  She is aware that she should avoid the use of nonsteroidal anti-inflammatories and remain well-hydrated due to prior issues with elevated creatinine that her most recent creatinine was within normal and she also reports congenital absence of 1 kidney.  On review of chart she is also on diuretic medication, Lasix which can cause increased creatinine through prerenal azotemia and has had recent follow-up with cardiologist regarding congestive heart failure.  She is aware of the  need to make sure that her blood pressure remains well controlled due to her history of prior renal insufficiency and having only 1 functioning kidney.  She continues to take her amlodipine and metoprolol.  4. Anemia in stage 3 chronic kidney disease, unspecified whether stage 3a or 3b CKD On review of chart, patient with hemoglobin of 10.8 and MCV of 102.5 on blood work done 04/08/2018.  She believes that she has had more recent blood work and will try to recall which specialist recently obtained blood work.  If she cannot determine when or where she has had recent blood work, she will need repeat BMP and creatinine at upcoming visit.  6. Anxiety and depression She is currently on Remeron/mirtazapine for treatment of anxiety and depression.  She declined referral for counseling and was not interested at this time and being contacted by Education officer, museum.   Follow Up Instructions:Return in about 6 weeks (around 07/07/2019) for Chronic issues.   I discussed the assessment and treatment plan with the patient. The patient was provided an opportunity to ask questions and all were answered. The patient agreed with the plan and demonstrated an understanding of the instructions.   The patient was advised to call back or seek an in-person evaluation if the symptoms worsen or if the condition fails to improve as anticipated.  I provided 23 minutes of non-face-to-face time during this encounter.   Antony Blackbird, MD

## 2019-05-26 NOTE — Progress Notes (Signed)
Requesting refills on Buspar.

## 2019-06-02 ENCOUNTER — Other Ambulatory Visit: Payer: Self-pay

## 2019-06-02 DIAGNOSIS — I10 Essential (primary) hypertension: Secondary | ICD-10-CM

## 2019-06-02 DIAGNOSIS — E7800 Pure hypercholesterolemia, unspecified: Secondary | ICD-10-CM

## 2019-06-02 MED ORDER — BUSPIRONE HCL 15 MG PO TABS: 15.0000 mg | ORAL_TABLET | Freq: Three times a day (TID) | ORAL | 2 refills | Status: DC

## 2019-06-02 NOTE — Addendum Note (Signed)
Addended by: Carylon Perches on: 06/02/2019 04:16 PM   Modules accepted: Orders

## 2019-06-03 ENCOUNTER — Other Ambulatory Visit: Payer: Self-pay

## 2019-06-03 ENCOUNTER — Other Ambulatory Visit: Payer: PPO

## 2019-06-03 DIAGNOSIS — I10 Essential (primary) hypertension: Secondary | ICD-10-CM

## 2019-06-03 DIAGNOSIS — E7800 Pure hypercholesterolemia, unspecified: Secondary | ICD-10-CM

## 2019-06-03 NOTE — Progress Notes (Signed)
Patient here for fasting labs. 

## 2019-06-04 ENCOUNTER — Encounter: Payer: Self-pay | Admitting: Orthopedic Surgery

## 2019-06-04 ENCOUNTER — Ambulatory Visit (INDEPENDENT_AMBULATORY_CARE_PROVIDER_SITE_OTHER): Payer: PPO | Admitting: Orthopedic Surgery

## 2019-06-04 ENCOUNTER — Other Ambulatory Visit: Payer: Self-pay

## 2019-06-04 ENCOUNTER — Ambulatory Visit: Payer: Self-pay

## 2019-06-04 DIAGNOSIS — M25462 Effusion, left knee: Secondary | ICD-10-CM

## 2019-06-04 DIAGNOSIS — M25562 Pain in left knee: Secondary | ICD-10-CM

## 2019-06-04 LAB — COMPREHENSIVE METABOLIC PANEL WITH GFR
ALT: 13 IU/L (ref 0–32)
AST: 28 IU/L (ref 0–40)
Albumin/Globulin Ratio: 2.2 (ref 1.2–2.2)
Albumin: 4.3 g/dL (ref 3.7–4.7)
Alkaline Phosphatase: 85 IU/L (ref 39–117)
BUN/Creatinine Ratio: 13 (ref 12–28)
BUN: 15 mg/dL (ref 8–27)
Bilirubin Total: 0.5 mg/dL (ref 0.0–1.2)
CO2: 24 mmol/L (ref 20–29)
Calcium: 9.3 mg/dL (ref 8.7–10.3)
Chloride: 102 mmol/L (ref 96–106)
Creatinine, Ser: 1.12 mg/dL — ABNORMAL HIGH (ref 0.57–1.00)
GFR calc Af Amer: 54 mL/min/1.73 — ABNORMAL LOW
GFR calc non Af Amer: 47 mL/min/1.73 — ABNORMAL LOW
Globulin, Total: 2 g/dL (ref 1.5–4.5)
Glucose: 81 mg/dL (ref 65–99)
Potassium: 4.1 mmol/L (ref 3.5–5.2)
Sodium: 141 mmol/L (ref 134–144)
Total Protein: 6.3 g/dL (ref 6.0–8.5)

## 2019-06-04 LAB — CBC WITH DIFFERENTIAL/PLATELET
Basophils Absolute: 0 x10E3/uL (ref 0.0–0.2)
Basos: 0 %
EOS (ABSOLUTE): 0.1 x10E3/uL (ref 0.0–0.4)
Eos: 2 %
Hematocrit: 34.7 % (ref 34.0–46.6)
Hemoglobin: 12 g/dL (ref 11.1–15.9)
Immature Grans (Abs): 0 x10E3/uL (ref 0.0–0.1)
Immature Granulocytes: 0 %
Lymphocytes Absolute: 2.3 x10E3/uL (ref 0.7–3.1)
Lymphs: 39 %
MCH: 34.6 pg — ABNORMAL HIGH (ref 26.6–33.0)
MCHC: 34.6 g/dL (ref 31.5–35.7)
MCV: 100 fL — ABNORMAL HIGH (ref 79–97)
Monocytes Absolute: 0.5 x10E3/uL (ref 0.1–0.9)
Monocytes: 9 %
Neutrophils Absolute: 2.9 x10E3/uL (ref 1.4–7.0)
Neutrophils: 50 %
Platelets: 167 x10E3/uL (ref 150–450)
RBC: 3.47 x10E6/uL — ABNORMAL LOW (ref 3.77–5.28)
RDW: 14.3 % (ref 11.7–15.4)
WBC: 5.9 x10E3/uL (ref 3.4–10.8)

## 2019-06-04 LAB — LIPID PANEL
Chol/HDL Ratio: 3.3 ratio (ref 0.0–4.4)
Cholesterol, Total: 199 mg/dL (ref 100–199)
HDL: 60 mg/dL
LDL Chol Calc (NIH): 115 mg/dL — ABNORMAL HIGH (ref 0–99)
Triglycerides: 135 mg/dL (ref 0–149)
VLDL Cholesterol Cal: 24 mg/dL (ref 5–40)

## 2019-06-04 MED ORDER — METHYLPREDNISOLONE ACETATE 40 MG/ML IJ SUSP
40.0000 mg | INTRAMUSCULAR | Status: AC | PRN
  Administered 2019-06-04: 40 mg via INTRA_ARTICULAR

## 2019-06-04 MED ORDER — LIDOCAINE HCL 1 % IJ SOLN
5.0000 mL | INTRAMUSCULAR | Status: AC | PRN
  Administered 2019-06-04: 5 mL

## 2019-06-04 MED ORDER — BUPIVACAINE HCL 0.25 % IJ SOLN
4.0000 mL | INTRAMUSCULAR | Status: AC | PRN
  Administered 2019-06-04: 4 mL via INTRA_ARTICULAR

## 2019-06-04 NOTE — Progress Notes (Signed)
Office Visit Note   Patient: [name] [name]           Date of Birth: 31-Oct-1940           MRN: [id number] Visit Date: 06/04/2019 Requested by: Antony Blackbird, MD [place],  [name] [address] PCP: Ernestene Kiel, MD  Subjective: Chief Complaint  Patient presents with  . Left Knee - Pain    HPI: [name] [name] is a patient with left knee pain.  She fell earlier this year and landed on her knee.  Reports swelling and tenderness to touch with some pain with weightbearing.  All radiographs do show some medial compartment arthritis.  She had decreased oxygen level and somehow ended up in a mental hospital.  She has had the knee aspirated twice in 1 injection done from a superior medial approach which did not give her much relief.  She describes having neuropathy in both legs as well as arthritis in her back which causes pain.              ROS: All systems reviewed are negative as they relate to the chief complaint within the history of present illness.  Patient denies  fevers or chills.   Assessment & Plan: Visit Diagnoses:  1. Left knee pain, unspecified chronicity   2. Effusion, left knee     Plan: Impression is left knee pain with arthritis and a fall which likely exacerbated the arthritis.  Knee is aspirated and injected today.  See her back in 6 weeks to decide for or against further intervention.  Plain radiographs to be repeated at that time.  Follow-Up Instructions: Return in about 6 weeks (around 07/16/2019).   Orders:  Orders Placed This Encounter  Procedures  . XR Knee 1-2 Views Left   No orders of the defined types were placed in this encounter.     Procedures: Large Joint Inj: L knee on 06/04/2019 3:04 PM Indications: diagnostic evaluation, joint swelling and pain Details: 18 G 1.5 in needle, superolateral approach  Arthrogram: No  Medications: 5 mL lidocaine 1 %; 40 mg methylPREDNISolone acetate 40 MG/ML; 4 mL bupivacaine 0.25 % Outcome: tolerated well, no  immediate complications Procedure, treatment alternatives, risks and benefits explained, specific risks discussed. Consent was given by the patient. Immediately prior to procedure a time out was called to verify the correct patient, procedure, equipment, support staff and site/side marked as required. Patient was prepped and draped in the usual sterile fashion.       Clinical Data: No additional findings.  Objective: Vital Signs: There were no vitals taken for this visit.  Physical Exam:   Constitutional: Patient appears well-developed HEENT:  Head: Normocephalic Eyes:EOM are normal Neck: Normal range of motion Cardiovascular: Normal rate Pulmonary/chest: Effort normal Neurologic: Patient is alert Skin: Skin is warm Psychiatric: Patient has normal mood and affect    Ortho Exam: Bilateral lower extremity exam demonstrates venous stasis changes in the skin but not much in the way of pitting edema.  Both feet are warm and perfused with intact ankle dorsiflexion and plantarflexion.  Left knee has trace effusion right knee no effusion.  Patient has full extension with intact extensor mechanism on the left and stable collateral cruciate ligaments.  Flexion easily past 90 to about 100 degrees on that left knee.  No groin pain with internal/external rotation on the left.  Specialty Comments:  No specialty comments available.  Imaging: No results found.   PMFS History: Patient Active Problem List  Diagnosis Date Noted  . Homicidal ideation   . Psychosocial stressors 04/03/2018  . Primary open angle glaucoma (POAG) of both eyes, severe stage 12/25/2017  . Polypharmacy 11/11/2017  . Congenital absence of one kidney 09/05/2017  . Anemia in chronic kidney disease 09/24/2016  . Stage 3 chronic kidney disease 09/24/2016  . Peripheral vascular disease ([place])   . Acrochordon 08/03/2015  . Stasis dermatitis of both legs 01/18/2015  . Radiculopathy of lumbar region 01/06/2015  .  Seborrheic keratosis 11/24/2014  . OSA (obstructive sleep apnea) 09/21/2014  . Osteoporosis 06/29/2014  . Thyroid nodule 06/29/2014  . Breast calcifications 06/29/2014  . Glaucoma 01/08/2014  . Basal cell carcinoma of scalp 11/12/2013  . Presbyopia 10/22/2013  . Major depressive disorder, single episode, unspecified 10/22/2013  . Resting tremor 10/14/2013  . Lumbar back pain with radiculopathy affecting right lower extremity 09/11/2013  . Polymyalgia rheumatica ([place]) 09/11/2013  . GERD (gastroesophageal reflux disease) 09/11/2013  . Major depressive disorder, recurrent episode, severe ([place]) 05/14/2013  . Pulmonary nodules 05/24/2012  . Carotid bruit 04/30/2012  . Essential hypertension 04/27/2012  . Anxiety state 04/27/2012  . Mixed hyperlipidemia 04/27/2012  . Cervical spondylosis with radiculopathy 04/19/2012   Past Medical History:  Diagnosis Date  . Anxiety   . Arthritis    RA  . Carpal tunnel syndrome, bilateral   . Chronic kidney disease    CKD stage III, absence of left kidney  . Congenital absence of one kidney    Pt has right kidney only  . Depression   . GERD (gastroesophageal reflux disease)   . Headache   . Heart murmur   . Hypercholesteremia   . Hypertension   . Peripheral vascular disease ([place])    a. s/p R external iliac stent '06 with angioplasty '13 (Dr. Benjamine Sprague) b. 01/2016: s/p PTA/stenting in L common iliac artery (Dr. Gwenlyn Found)  . Polymyalgia rheumatica ([place])   . Psoriasis     Family History  Problem Relation Age of Onset  . Angina Mother   . Heart attack Father   . Melanoma Brother   . Cancer Sister        Lymphoma    Past Surgical History:  Procedure Laterality Date  . ABDOMINAL HYSTERECTOMY  1963   Partial,  Due to bleeding after delivery  . ANTERIOR CERVICAL DECOMP/DISCECTOMY FUSION  04/19/2012   Procedure: ANTERIOR CERVICAL DECOMPRESSION/DISCECTOMY FUSION 2 LEVELS;  Surgeon: Winfield Cunas, MD;  Location: [facility];  Service: Neurosurgery;   Laterality: N/A;  Cervical four-five,Cervical five-six anterior cervical decompression with fusion plating and bonegraft possible posterior cervical decompression  . APPENDECTOMY    . CARPAL TUNNEL RELEASE  1990  . CERVICAL FUSION  04/19/2012  . EYE SURGERY    . ILIAC ARTERY STENT    . PERIPHERAL VASCULAR CATHETERIZATION N/A 01/31/2016   Procedure: Abdominal Aortogram;  Surgeon: [name], MD;  Location: [facility];  Service: Cardiovascular;  Laterality: N/A;  . PERIPHERAL VASCULAR CATHETERIZATION Bilateral 01/31/2016   Procedure: Lower Extremity Angiography;  Surgeon: [name], MD;  Location: [facility];  Service: Cardiovascular;  Laterality: Bilateral;  . PERIPHERAL VASCULAR CATHETERIZATION Left 01/31/2016   Procedure: Peripheral Vascular Intervention;  Surgeon: [name], MD;  Location: [facility];  Service: Cardiovascular;  Laterality: Left;  ILIAC  . TONSILLECTOMY     Social History   Occupational History  . Not on file  Tobacco Use  . Smoking status: Former Smoker  Packs/day: 1.00    Years: 40.00    Pack years: 40.00    Types: Cigarettes    Quit date: 07/24/2010    Years since quitting: 8.8  . Smokeless tobacco: Never Used  . Tobacco comment: STARTED BACK JULY 2013 AND RECENTLY QUIT 03-10-2012  Substance and Sexual Activity  . Alcohol use: No    Alcohol/week: 0.0 standard drinks    Comment: QUIT IN 1999  . Drug use: No  . Sexual activity: Never    Comment: 1st intercourse 78 yo-Fewer than 5 partners

## 2019-06-09 ENCOUNTER — Telehealth: Payer: Self-pay | Admitting: Internal Medicine

## 2019-06-09 NOTE — Telephone Encounter (Signed)
1) Medication(s) Requested (by name): traMADol (ULTRAM) 50 MG tablet   2) Pharmacy of Choice: -[facility], [place], [place] [address]  3) Special Requests: Pt states she used to get this medication from her old PCP for knee pain

## 2019-06-10 ENCOUNTER — Other Ambulatory Visit: Payer: Self-pay | Admitting: Family Medicine

## 2019-06-10 DIAGNOSIS — M5416 Radiculopathy, lumbar region: Secondary | ICD-10-CM

## 2019-06-10 DIAGNOSIS — M170 Bilateral primary osteoarthritis of knee: Secondary | ICD-10-CM

## 2019-06-10 MED ORDER — TRAMADOL HCL 50 MG PO TABS: 50.0000 mg | ORAL_TABLET | Freq: Three times a day (TID) | ORAL | 1 refills | Status: AC | PRN

## 2019-06-10 NOTE — Progress Notes (Signed)
Patient notified of results & recommendations. Expressed understanding.

## 2019-06-10 NOTE — Progress Notes (Signed)
Patient ID: [name] [name], female   DOB: 08/21/1940, 78 y.o.   MRN: [id number]   78 year old female with lumbar radiculopathy and primary osteoarthritis of both knees.  Patient with issues with chronic pain and is currently on tramadol.  She will be referred to pain management for further evaluation and treatment and ongoing prescriptions of controlled substances as this practice does not prescribe controlled substances long-term

## 2019-06-10 NOTE — Telephone Encounter (Signed)
If you can let patient know that in general this office does not do long term refills of pain medications but I can send in a refill and schedule an appointment for her to see a pain management doctor and she can ask her Orthopedic doctor if that office would provider ongoing refills of tramadol

## 2019-06-10 NOTE — Telephone Encounter (Signed)
Patient notified of the information below.

## 2019-06-10 NOTE — Progress Notes (Signed)
Patient ID: [name] [name], female   DOB: 02/12/1941, 78 y.o.   MRN: [id number]    Message received the patient is requesting refill of tramadol which was provided by her prior provider.  Patient will be notified by medical assistant this office does not in general provide long-term refills of controlled substances.  Refill will be provided until she can be seen by pain medicine specialist and referral will be placed or patient may request that her orthopedic office provide long-term refill of tramadol.

## 2019-06-19 ENCOUNTER — Telehealth: Payer: Self-pay | Admitting: Internal Medicine

## 2019-06-19 DIAGNOSIS — I10 Essential (primary) hypertension: Secondary | ICD-10-CM

## 2019-06-19 MED ORDER — AMLODIPINE BESYLATE 5 MG PO TABS: ORAL_TABLET | ORAL | 0 refills | Status: DC

## 2019-06-19 NOTE — Telephone Encounter (Signed)
Refill sent.

## 2019-06-19 NOTE — Telephone Encounter (Signed)
1) Medication(s) Requested (by name):furosemide (LASIX) 20 MG tablet [id number] amLODipine (NORVASC) 5 MG tablet [id number]    2) [id number] - [facility], [address] AT [facility] [address]  [place], [place] [address]   3) Special Requests:   Approved medications will be sent to the pharmacy, we will reach out if there is an issue.  Requests made after 3pm may not be addressed until the following business day!  If a patient is unsure of the name of the medication(s) please note and ask patient to call back when they are able to provide all info, do not send to responsible party until all information is available!

## 2019-06-23 DIAGNOSIS — Z79899 Other long term (current) drug therapy: Secondary | ICD-10-CM | POA: Diagnosis not present

## 2019-06-23 DIAGNOSIS — M545 Low back pain: Secondary | ICD-10-CM | POA: Diagnosis not present

## 2019-06-23 DIAGNOSIS — M5137 Other intervertebral disc degeneration, lumbosacral region: Secondary | ICD-10-CM | POA: Diagnosis not present

## 2019-06-23 DIAGNOSIS — M5430 Sciatica, unspecified side: Secondary | ICD-10-CM | POA: Diagnosis not present

## 2019-06-30 DIAGNOSIS — H21562 Pupillary abnormality, left eye: Secondary | ICD-10-CM | POA: Diagnosis not present

## 2019-06-30 DIAGNOSIS — H5359 Other color vision deficiencies: Secondary | ICD-10-CM | POA: Diagnosis not present

## 2019-06-30 DIAGNOSIS — H401133 Primary open-angle glaucoma, bilateral, severe stage: Secondary | ICD-10-CM | POA: Diagnosis not present

## 2019-06-30 DIAGNOSIS — H53452 Other localized visual field defect, left eye: Secondary | ICD-10-CM | POA: Diagnosis not present

## 2019-07-08 DIAGNOSIS — Z20828 Contact with and (suspected) exposure to other viral communicable diseases: Secondary | ICD-10-CM | POA: Diagnosis not present

## 2019-07-16 ENCOUNTER — Ambulatory Visit: Payer: PPO | Admitting: Orthopedic Surgery

## 2019-08-11 ENCOUNTER — Telehealth: Payer: Self-pay | Admitting: Internal Medicine

## 2019-08-11 NOTE — Telephone Encounter (Signed)
Patient has been referred to [facility] for pain management. She was scheduled to see them in December 2020. She needs to contact them for refills.

## 2019-08-11 NOTE — Telephone Encounter (Signed)
1) Medication(s) Requested (by name): traMADol (ULTRAM) 50 MG tablet [id number]   2) Pharmacy of Choice: [facility], [address] AT [place] [address]   Patient requesting 90 day   Approved medications will be sent to pharmacy, we will reach out to you if there is an issue.  Requests made after 3pm may not be addressed until following business day!

## 2019-08-11 NOTE — Telephone Encounter (Signed)
Called and confirmed patients phone visit tomorrow, 08/12/2019 @ 8:50

## 2019-08-12 ENCOUNTER — Encounter: Payer: Self-pay | Admitting: Internal Medicine

## 2019-08-12 ENCOUNTER — Ambulatory Visit (INDEPENDENT_AMBULATORY_CARE_PROVIDER_SITE_OTHER): Payer: Medicare HMO | Admitting: Internal Medicine

## 2019-08-12 DIAGNOSIS — R251 Tremor, unspecified: Secondary | ICD-10-CM

## 2019-08-12 DIAGNOSIS — H8110 Benign paroxysmal vertigo, unspecified ear: Secondary | ICD-10-CM

## 2019-08-12 MED ORDER — MECLIZINE HCL 25 MG PO TABS: 25.0000 mg | ORAL_TABLET | Freq: Two times a day (BID) | ORAL | 0 refills | Status: DC | PRN

## 2019-08-12 NOTE — Progress Notes (Signed)
Virtual Visit via Telephone Note  I connected with [name] [name], on 08/12/2019 at 8:53 AM by telephone due to the COVID-19 pandemic and verified that I am speaking with the correct person using two identifiers.   Consent: I discussed the limitations, risks, security and privacy concerns of performing an evaluation and management service by telephone and the availability of in person appointments. I also discussed with the patient that there may be a patient responsible charge related to this service. The patient expressed understanding and agreed to proceed.   Location of Patient: Home   Location of Provider: Clinic    Persons participating in Telemedicine visit: Becci Hristov [facility] Dr. Juleen China      History of Present Illness: Patient has a visit today concerning her vertigo and also reports her tremors have gotten a lot worse. However, now her tremor is getting better and she can drink water out of a bottle. Can't hold her coffee. Can't feed herself due to fear of stabbing herself with the fork. Doesn't understand why the tremors are so serious but says they are subsidizing. She had vertigo real bad and was on medications; doesn't remember name of the medication. She was doing Epley maneuver at home and had quit because her vertigo was better. She is now doing the exercises again but they don't seem to be helping as much. Having nausea/vomiting associated with vertigo. Afraid about falling. Uses a walker to ambulate.     Past Medical History:  Diagnosis Date  . Anxiety   . Arthritis    RA  . Carpal tunnel syndrome, bilateral   . Chronic kidney disease    CKD stage III, absence of left kidney  . Congenital absence of one kidney    Pt has right kidney only  . Depression   . GERD (gastroesophageal reflux disease)   . Headache   . Heart murmur   . Hypercholesteremia   . Hypertension   . Peripheral vascular disease ([place])    a. s/p R external iliac stent '06 with  angioplasty '13 (Dr. Benjamine Sprague) b. 01/2016: s/p PTA/stenting in L common iliac artery (Dr. Gwenlyn Found)  . Polymyalgia rheumatica ([place])   . Psoriasis    Allergies  Allergen Reactions  . Gabapentin Itching    Was deleted since patient was taking, but now experiencing itching again.   . Statins Swelling, Rash and Other (See Comments)    Swelling involving tongue; also causes muscle pain    Current Outpatient Medications on File Prior to Visit  Medication Sig Dispense Refill  . amLODipine (NORVASC) 5 MG tablet TAKE 1 TABLET(5 MG) BY MOUTH DAILY 90 tablet 0  . aspirin EC 81 MG EC tablet Take 1 tablet (81 mg total) by mouth daily.    . brimonidine (ALPHAGAN) 0.15 % ophthalmic solution Place 1 drop into both eyes 3 (three) times daily.    . busPIRone (BUSPAR) 15 MG tablet Take 1 tablet (15 mg total) by mouth 3 (three) times daily. 90 tablet 2  . cholecalciferol (VITAMIN D) 1000 units tablet Take 1,000 Units by mouth daily.    . citalopram (CELEXA) 20 MG tablet Take 1 tablet (20 mg total) by mouth daily. 90 tablet 0  . clopidogrel (PLAVIX) 75 MG tablet Take 1 tablet (75 mg total) by mouth every morning. 90 tablet 3  . Cyanocobalamin (VITAMIN B 12 PO) Take 1 tablet by mouth daily.    . divalproex (DEPAKOTE) 125 MG DR tablet Take 1 tablet (125 mg total)  by mouth daily. 30 tablet 0  . Dorzolamide HCl-Timolol Mal PF 2-0.5 % SOLN Place 1 drop into both eyes 2 (two) times daily.    . ferrous sulfate 325 (65 FE) MG tablet Take 325 mg by mouth daily.    . furosemide (LASIX) 20 MG tablet Take 20 mg by mouth daily.    . Latanoprostene Bunod 0.024 % SOLN Place 1 drop into both eyes at bedtime.    . metoprolol tartrate (LOPRESSOR) 25 MG tablet Take 0.5 tablets (12.5 mg total) by mouth 2 (two) times daily. 30 tablet 11  . mirtazapine (REMERON) 15 MG tablet Take 1 tablet (15 mg total) by mouth at bedtime. 90 tablet 1  . pantoprazole (PROTONIX) 40 MG tablet Take 40 mg by mouth daily.     No current  facility-administered medications on file prior to visit.    Observations/Objective: NAD. Speaking clearly.  Work of breathing normal.  Alert and oriented. Mood appropriate.   Assessment and Plan: 1. Benign paroxysmal positional vertigo, unspecified laterality Based on her history, sounds like she has had a history of BPPV. Encouraged continued use of Epley maneuver at home. Have prescribed Meclizine for symptoms at low dose due to age. Counseled on anticholinergic side effects.  - meclizine (ANTIVERT) 25 MG tablet; Take 1 tablet (25 mg total) by mouth 2 (two) times daily as needed for dizziness.  Dispense: 30 tablet; Refill: 0  2. Tremor Given tremor has worsened significantly per patient, will refer to neurology as she has never had a formal evaluation.  - Ambulatory referral to Neurology   Follow Up Instructions: Follow up with neuro.    I discussed the assessment and treatment plan with the patient. The patient was provided an opportunity to ask questions and all were answered. The patient agreed with the plan and demonstrated an understanding of the instructions.   The patient was advised to call back or seek an in-person evaluation if the symptoms worsen or if the condition fails to improve as anticipated.     I provided 14 minutes total of non-face-to-face time during this encounter including median intraservice time, reviewing previous notes, investigations, ordering medications, medical decision making, coordinating care and patient verbalized understanding at the end of the visit.    Phill Myron, D.O. Primary Care at [facility]  08/12/2019, 8:53 AM

## 2019-08-15 ENCOUNTER — Other Ambulatory Visit: Payer: Self-pay

## 2019-08-15 MED ORDER — BUSPIRONE HCL 15 MG PO TABS: 15.0000 mg | ORAL_TABLET | Freq: Three times a day (TID) | ORAL | 2 refills | Status: DC

## 2019-08-21 ENCOUNTER — Other Ambulatory Visit: Payer: Self-pay | Admitting: Internal Medicine

## 2019-08-21 ENCOUNTER — Telehealth: Payer: Self-pay | Admitting: Internal Medicine

## 2019-08-21 MED ORDER — GABAPENTIN 100 MG PO CAPS: 100.0000 mg | ORAL_CAPSULE | Freq: Three times a day (TID) | ORAL | 3 refills | Status: DC

## 2019-08-21 NOTE — Telephone Encounter (Signed)
Pt called asking for gabapentin 100 mg  and tramadol 50mg 

## 2019-08-21 NOTE — Telephone Encounter (Signed)
I will refill her Gabapentin but this office does not provide refills for Tramadol. I see previous physician placed referral to pain medicine and she has an appointment there in the next couple days.   Phill Myron, D.O. Primary Care at [facility]  08/21/2019, 10:40 AM

## 2019-08-21 NOTE — Telephone Encounter (Signed)
Called the pt she understands

## 2019-08-25 DIAGNOSIS — M5137 Other intervertebral disc degeneration, lumbosacral region: Secondary | ICD-10-CM | POA: Diagnosis not present

## 2019-08-25 DIAGNOSIS — Z79899 Other long term (current) drug therapy: Secondary | ICD-10-CM | POA: Diagnosis not present

## 2019-08-25 DIAGNOSIS — M545 Low back pain: Secondary | ICD-10-CM | POA: Diagnosis not present

## 2019-08-25 DIAGNOSIS — R519 Headache, unspecified: Secondary | ICD-10-CM | POA: Diagnosis not present

## 2019-09-04 ENCOUNTER — Telehealth: Payer: Self-pay | Admitting: Internal Medicine

## 2019-09-04 DIAGNOSIS — R42 Dizziness and giddiness: Secondary | ICD-10-CM | POA: Diagnosis not present

## 2019-09-04 DIAGNOSIS — R51 Headache with orthostatic component, not elsewhere classified: Secondary | ICD-10-CM | POA: Diagnosis not present

## 2019-09-04 NOTE — Telephone Encounter (Signed)
Pt daughter called asking for her mom to have at home health ordered

## 2019-09-05 ENCOUNTER — Inpatient Hospital Stay (HOSPITAL_COMMUNITY)
Admission: EM | Admit: 2019-09-05 | Discharge: 2019-09-08 | DRG: 189 | Disposition: A | Discharge status: Skilled Nursing Facility | Payer: PPO | Attending: Internal Medicine | Admitting: Internal Medicine

## 2019-09-05 ENCOUNTER — Other Ambulatory Visit: Payer: Self-pay | Admitting: Internal Medicine

## 2019-09-05 ENCOUNTER — Encounter (HOSPITAL_COMMUNITY): Payer: Self-pay

## 2019-09-05 ENCOUNTER — Emergency Department (HOSPITAL_COMMUNITY): Payer: PPO

## 2019-09-05 ENCOUNTER — Emergency Department (HOSPITAL_COMMUNITY): Admit: 2019-09-05 | Payer: Medicare HMO | Source: Home / Self Care

## 2019-09-05 ENCOUNTER — Other Ambulatory Visit: Payer: Self-pay

## 2019-09-05 DIAGNOSIS — Z7902 Long term (current) use of antithrombotics/antiplatelets: Secondary | ICD-10-CM | POA: Diagnosis not present

## 2019-09-05 DIAGNOSIS — Z888 Allergy status to other drugs, medicaments and biological substances status: Secondary | ICD-10-CM | POA: Diagnosis not present

## 2019-09-05 DIAGNOSIS — N179 Acute kidney failure, unspecified: Secondary | ICD-10-CM | POA: Diagnosis not present

## 2019-09-05 DIAGNOSIS — M353 Polymyalgia rheumatica: Secondary | ICD-10-CM | POA: Diagnosis not present

## 2019-09-05 DIAGNOSIS — Z66 Do not resuscitate: Secondary | ICD-10-CM | POA: Diagnosis not present

## 2019-09-05 DIAGNOSIS — S41111A Laceration without foreign body of right upper arm, initial encounter: Secondary | ICD-10-CM | POA: Diagnosis present

## 2019-09-05 DIAGNOSIS — C4491 Basal cell carcinoma of skin, unspecified: Secondary | ICD-10-CM | POA: Diagnosis not present

## 2019-09-05 DIAGNOSIS — Y92012 Bathroom of single-family (private) house as the place of occurrence of the external cause: Secondary | ICD-10-CM | POA: Diagnosis not present

## 2019-09-05 DIAGNOSIS — R2689 Other abnormalities of gait and mobility: Secondary | ICD-10-CM | POA: Diagnosis not present

## 2019-09-05 DIAGNOSIS — I10 Essential (primary) hypertension: Secondary | ICD-10-CM

## 2019-09-05 DIAGNOSIS — Z9181 History of falling: Secondary | ICD-10-CM

## 2019-09-05 DIAGNOSIS — Z87891 Personal history of nicotine dependence: Secondary | ICD-10-CM

## 2019-09-05 DIAGNOSIS — G909 Disorder of the autonomic nervous system, unspecified: Secondary | ICD-10-CM | POA: Diagnosis present

## 2019-09-05 DIAGNOSIS — R296 Repeated falls: Secondary | ICD-10-CM | POA: Diagnosis present

## 2019-09-05 DIAGNOSIS — M545 Low back pain: Secondary | ICD-10-CM | POA: Diagnosis present

## 2019-09-05 DIAGNOSIS — R251 Tremor, unspecified: Secondary | ICD-10-CM | POA: Diagnosis not present

## 2019-09-05 DIAGNOSIS — R42 Dizziness and giddiness: Secondary | ICD-10-CM | POA: Diagnosis not present

## 2019-09-05 DIAGNOSIS — W010XXA Fall on same level from slipping, tripping and stumbling without subsequent striking against object, initial encounter: Secondary | ICD-10-CM | POA: Diagnosis present

## 2019-09-05 DIAGNOSIS — G8929 Other chronic pain: Secondary | ICD-10-CM | POA: Diagnosis not present

## 2019-09-05 DIAGNOSIS — N189 Chronic kidney disease, unspecified: Secondary | ICD-10-CM | POA: Diagnosis not present

## 2019-09-05 DIAGNOSIS — Y92009 Unspecified place in unspecified non-institutional (private) residence as the place of occurrence of the external cause: Secondary | ICD-10-CM | POA: Diagnosis not present

## 2019-09-05 DIAGNOSIS — N183 Chronic kidney disease, stage 3 unspecified: Secondary | ICD-10-CM | POA: Diagnosis not present

## 2019-09-05 DIAGNOSIS — Q600 Renal agenesis, unilateral: Secondary | ICD-10-CM | POA: Diagnosis not present

## 2019-09-05 DIAGNOSIS — I959 Hypotension, unspecified: Secondary | ICD-10-CM | POA: Diagnosis present

## 2019-09-05 DIAGNOSIS — M25562 Pain in left knee: Secondary | ICD-10-CM | POA: Diagnosis not present

## 2019-09-05 DIAGNOSIS — Z79899 Other long term (current) drug therapy: Secondary | ICD-10-CM

## 2019-09-05 DIAGNOSIS — E869 Volume depletion, unspecified: Secondary | ICD-10-CM | POA: Diagnosis not present

## 2019-09-05 DIAGNOSIS — S299XXA Unspecified injury of thorax, initial encounter: Secondary | ICD-10-CM | POA: Diagnosis not present

## 2019-09-05 DIAGNOSIS — J9811 Atelectasis: Secondary | ICD-10-CM | POA: Diagnosis present

## 2019-09-05 DIAGNOSIS — G4733 Obstructive sleep apnea (adult) (pediatric): Secondary | ICD-10-CM | POA: Diagnosis not present

## 2019-09-05 DIAGNOSIS — R824 Acetonuria: Secondary | ICD-10-CM | POA: Diagnosis not present

## 2019-09-05 DIAGNOSIS — I739 Peripheral vascular disease, unspecified: Secondary | ICD-10-CM

## 2019-09-05 DIAGNOSIS — I129 Hypertensive chronic kidney disease with stage 1 through stage 4 chronic kidney disease, or unspecified chronic kidney disease: Secondary | ICD-10-CM | POA: Diagnosis present

## 2019-09-05 DIAGNOSIS — F329 Major depressive disorder, single episode, unspecified: Secondary | ICD-10-CM | POA: Diagnosis present

## 2019-09-05 DIAGNOSIS — R41841 Cognitive communication deficit: Secondary | ICD-10-CM | POA: Diagnosis not present

## 2019-09-05 DIAGNOSIS — J9601 Acute respiratory failure with hypoxia: Principal | ICD-10-CM | POA: Diagnosis present

## 2019-09-05 DIAGNOSIS — R0902 Hypoxemia: Secondary | ICD-10-CM | POA: Diagnosis not present

## 2019-09-05 DIAGNOSIS — M47892 Other spondylosis, cervical region: Secondary | ICD-10-CM | POA: Diagnosis not present

## 2019-09-05 DIAGNOSIS — M810 Age-related osteoporosis without current pathological fracture: Secondary | ICD-10-CM | POA: Diagnosis not present

## 2019-09-05 DIAGNOSIS — Z20822 Contact with and (suspected) exposure to covid-19: Secondary | ICD-10-CM | POA: Diagnosis not present

## 2019-09-05 DIAGNOSIS — F339 Major depressive disorder, recurrent, unspecified: Secondary | ICD-10-CM | POA: Diagnosis not present

## 2019-09-05 DIAGNOSIS — H524 Presbyopia: Secondary | ICD-10-CM | POA: Diagnosis not present

## 2019-09-05 DIAGNOSIS — R262 Difficulty in walking, not elsewhere classified: Secondary | ICD-10-CM | POA: Diagnosis not present

## 2019-09-05 DIAGNOSIS — M160 Bilateral primary osteoarthritis of hip: Secondary | ICD-10-CM | POA: Diagnosis not present

## 2019-09-05 DIAGNOSIS — L821 Other seborrheic keratosis: Secondary | ICD-10-CM | POA: Diagnosis not present

## 2019-09-05 DIAGNOSIS — M6281 Muscle weakness (generalized): Secondary | ICD-10-CM | POA: Diagnosis not present

## 2019-09-05 DIAGNOSIS — T1490XA Injury, unspecified, initial encounter: Secondary | ICD-10-CM

## 2019-09-05 DIAGNOSIS — M5416 Radiculopathy, lumbar region: Secondary | ICD-10-CM

## 2019-09-05 DIAGNOSIS — Z9981 Dependence on supplemental oxygen: Secondary | ICD-10-CM | POA: Diagnosis not present

## 2019-09-05 DIAGNOSIS — S0990XA Unspecified injury of head, initial encounter: Secondary | ICD-10-CM | POA: Diagnosis not present

## 2019-09-05 DIAGNOSIS — G252 Other specified forms of tremor: Secondary | ICD-10-CM | POA: Diagnosis not present

## 2019-09-05 DIAGNOSIS — W19XXXA Unspecified fall, initial encounter: Secondary | ICD-10-CM | POA: Diagnosis not present

## 2019-09-05 DIAGNOSIS — Y9301 Activity, walking, marching and hiking: Secondary | ICD-10-CM | POA: Diagnosis present

## 2019-09-05 DIAGNOSIS — F411 Generalized anxiety disorder: Secondary | ICD-10-CM | POA: Diagnosis not present

## 2019-09-05 DIAGNOSIS — R102 Pelvic and perineal pain: Secondary | ICD-10-CM | POA: Diagnosis not present

## 2019-09-05 DIAGNOSIS — H409 Unspecified glaucoma: Secondary | ICD-10-CM | POA: Diagnosis not present

## 2019-09-05 DIAGNOSIS — R921 Mammographic calcification found on diagnostic imaging of breast: Secondary | ICD-10-CM | POA: Diagnosis not present

## 2019-09-05 DIAGNOSIS — R52 Pain, unspecified: Secondary | ICD-10-CM | POA: Diagnosis not present

## 2019-09-05 DIAGNOSIS — E785 Hyperlipidemia, unspecified: Secondary | ICD-10-CM | POA: Diagnosis not present

## 2019-09-05 DIAGNOSIS — N1831 Chronic kidney disease, stage 3a: Secondary | ICD-10-CM | POA: Diagnosis not present

## 2019-09-05 DIAGNOSIS — R000 Tachycardia, unspecified: Secondary | ICD-10-CM | POA: Diagnosis not present

## 2019-09-05 DIAGNOSIS — S3991XA Unspecified injury of abdomen, initial encounter: Secondary | ICD-10-CM | POA: Diagnosis not present

## 2019-09-05 LAB — POC SARS CORONAVIRUS 2 AG -  ED: SARS Coronavirus 2 Ag: NEGATIVE

## 2019-09-05 MED ORDER — SODIUM CHLORIDE 0.9 % IV BOLUS
125.0000 mL | Freq: Once | INTRAVENOUS | Status: AC
  Administered 2019-09-06: 125 mL via INTRAVENOUS

## 2019-09-05 MED ORDER — FENTANYL CITRATE (PF) 100 MCG/2ML IJ SOLN
50.0000 ug | Freq: Once | INTRAMUSCULAR | Status: AC
  Administered 2019-09-05: 50 ug via INTRAVENOUS
  Filled 2019-09-05: qty 2

## 2019-09-05 NOTE — ED Provider Notes (Signed)
[facility] EMERGENCY DEPARTMENT Provider Note  CSN: [id number] Arrival date & time: 09/05/19 2327  Chief Complaint(s) Fall (Level 2 Trauma)  HPI [name] [name] is a 79 y.o. female who presents to the emergency department as a level 2 trauma.  Patient comes from home by EMS.  They were called out by the patient's daughter who reports she was called by the patient after she had lost her balance and falling backwards from her walker.  Patient did not remember whether she lost consciousness but reports she does remember having to crawl to the living room in order to call her daughter.  She is endorsing hip pain and back pain.  She does report chronic joint pain but feels is worse than usual.  EMS and patient reported blood thinners but not sure which ones.  Patient denied any known head trauma.  She denies any current chest pain.  Does endorse some mild shortness of breath.  She reports productive cough for 1-1/2 weeks.  Reports a remote history of smoking.  States that she stopped 15 years ago.  Denies any oxygen requirement.  HPI  Past Medical History History reviewed. No pertinent past medical history. There are no problems to display for this patient.  Home Medication(s) Prior to Admission medications   Not on File                                                                                                                                    Past Surgical History History reviewed. No pertinent surgical history. Family History History reviewed. No pertinent family history.  Social History Social History   Tobacco Use  . Smoking status: Former Research scientist (life sciences)  . Smokeless tobacco: Never Used  Substance Use Topics  . Alcohol use: Never  . Drug use: Never   Allergies Gabapentin and Statins  Review of Systems Review of Systems All other systems are reviewed and are negative for acute change except as noted in the HPI  Physical Exam Vital Signs  I have reviewed the  triage vital signs BP (!) 102/45 (BP Location: Left Arm)   Pulse 100   Temp 98.4 F (36.9 C) Comment: Tympanic  Resp 18   Ht 5\' 4"  (1.626 m)   Wt 63.5 kg   SpO2 90%   BMI 24.03 kg/m   Physical Exam Constitutional:      General: She is not in acute distress.    Appearance: She is well-developed. She is not diaphoretic.  HENT:     Head: Normocephalic and atraumatic.     Right Ear: External ear normal.     Left Ear: External ear normal.     Nose: Nose normal.  Eyes:     General: No scleral icterus.       Right eye: No discharge.        Left eye: No discharge.     Conjunctiva/sclera: Conjunctivae normal.  Pupils: Pupils are equal, round, and reactive to light.  Cardiovascular:     Rate and Rhythm: Normal rate and regular rhythm.     Pulses:          Radial pulses are 2+ on the right side and 2+ on the left side.       Dorsalis pedis pulses are 2+ on the right side and 2+ on the left side.     Heart sounds: Normal heart sounds. No murmur. No friction rub. No gallop.   Pulmonary:     Effort: Pulmonary effort is normal. No respiratory distress.     Breath sounds: Normal breath sounds. No stridor. No wheezing.  Abdominal:     General: There is no distension.     Palpations: Abdomen is soft.     Tenderness: There is no abdominal tenderness.  Musculoskeletal:     Cervical back: Normal range of motion and neck supple. No bony tenderness.     Thoracic back: Tenderness present. No bony tenderness.     Lumbar back: No bony tenderness.     Right hip: Tenderness present.     Left hip: Tenderness present.     Comments: Clavicles stable. Chest stable to AP/Lat compression. Pelvis stable to Lat compression. No obvious extremity deformity. No chest or abdominal wall contusion.  Skin:    General: Skin is warm and dry.     Findings: No erythema or rash.       Neurological:     Mental Status: She is alert and oriented to person, place, and time.     Comments: Moving all  extremities     ED Results and Treatments Labs (all labs ordered are listed, but only abnormal results are displayed) Labs Reviewed  COMPREHENSIVE METABOLIC PANEL - Abnormal; Notable for the following components:      Result Value   Chloride 97 (*)    Glucose, Bld 100 (*)    BUN 30 (*)    Creatinine, Ser 1.88 (*)    Albumin 3.4 (*)    Total Bilirubin 1.7 (*)    GFR calc non Af Amer 25 (*)    GFR calc Af Amer 29 (*)    Anion gap 17 (*)    All other components within normal limits  CBC - Abnormal; Notable for the following components:   RBC 3.09 (*)    Hemoglobin 10.9 (*)    HCT 31.5 (*)    MCV 101.9 (*)    MCH 35.3 (*)    All other components within normal limits  I-STAT CHEM 8, ED - Abnormal; Notable for the following components:   BUN 31 (*)    Creatinine, Ser 1.70 (*)    Hemoglobin 9.9 (*)    HCT 29.0 (*)    All other components within normal limits  ETHANOL  LACTIC ACID, PLASMA  PROTIME-INR  URINALYSIS, ROUTINE W REFLEX MICROSCOPIC  CDS SEROLOGY  POC SARS CORONAVIRUS 2 AG -  ED  SAMPLE TO BLOOD BANK  EKG  EKG Interpretation  Date/Time:  Saturday September 06 2019 00:05:22 EST Ventricular Rate:  96 PR Interval:    QRS Duration: 88 QT Interval:  364 QTC Calculation: 460 R Axis:   73 Text Interpretation: Sinus rhythm NO STEMI. No old tracing to compare Confirmed by Addison Lank [phone]) on 09/06/2019 2:10:24 AM      Radiology CT HEAD WO CONTRAST  Result Date: 09/06/2019 CLINICAL DATA:  Fall, hypoxia EXAM: CT HEAD WITHOUT CONTRAST TECHNIQUE: Contiguous axial images were obtained from the base of the skull through the vertex without intravenous contrast. COMPARISON:  03/06/2019 FINDINGS: Brain: No acute infarct or hemorrhage. Chronic bilateral basal ganglia lacunar infarcts are noted. Lateral ventricles and remaining midline structures are  unremarkable. No acute extra-axial fluid collections. No mass effect. Vascular: No hyperdense vessel or unexpected calcification. Skull: Normal. Negative for fracture or focal lesion. Sinuses/Orbits: No acute finding. Other: None IMPRESSION: 1. Stable chronic small vessel ischemic changes. No acute intracranial process. Electronically Signed   By: Randa Ngo M.D.   On: 09/06/2019 01:47   CT CHEST W CONTRAST  Result Date: 09/06/2019 CLINICAL DATA:  Fall, hypoxia EXAM: CT CHEST, ABDOMEN, AND PELVIS WITH CONTRAST TECHNIQUE: Multidetector CT imaging of the chest, abdomen and pelvis was performed following the standard protocol during bolus administration of intravenous contrast. CONTRAST:  86mL OMNIPAQUE IOHEXOL 300 MG/ML  SOLN COMPARISON:  11/17/2018 FINDINGS: CT CHEST FINDINGS Cardiovascular: The heart and great vessels are unremarkable without pericardial effusion. Moderate atherosclerosis of the aorta and coronary vessels unchanged. No evidence of mediastinal injury. Mediastinum/Nodes: Stable heterogeneous enlargement of the left lobe thyroid. No pathologic mediastinal or hilar adenopathy. Esophagus is unremarkable. Lungs/Pleura: Scattered areas of atelectasis are seen at the lung bases. No airspace disease, effusion, or pneumothorax. Central airways are widely patent. Musculoskeletal: There are no acute displaced fractures. Reconstructed images demonstrate no additional findings. CT ABDOMEN PELVIS FINDINGS Hepatobiliary: No focal liver abnormality is seen. No gallstones, gallbladder wall thickening, or biliary dilatation. Pancreas: Unremarkable. No pancreatic ductal dilatation or surrounding inflammatory changes. Spleen: Normal in size without focal abnormality. Adrenals/Urinary Tract: Stable severe left renal atrophy. Right kidney is unremarkable. The adrenals are normal. No urinary tract calculi or obstruction. The bladder is unremarkable. Stomach/Bowel: No bowel obstruction or ileus. No inflammatory  changes. Vascular/Lymphatic: Aortic atherosclerosis. No enlarged abdominal or pelvic lymph nodes. Reproductive: Status post hysterectomy. No adnexal masses. Other: No abdominal wall hernia or abnormality. No abdominopelvic ascites. Musculoskeletal: No acute displaced fractures. Reconstructed images demonstrate extensive multilevel thoracolumbar spondylosis. IMPRESSION: 1. No acute abnormalities involving the chest, abdomen or pelvis. 2. Aortic Atherosclerosis (ICD10-I70.0). Electronically Signed   By: Randa Ngo M.D.   On: 09/06/2019 01:53   CT ABDOMEN PELVIS W CONTRAST  Result Date: 09/06/2019 CLINICAL DATA:  Fall, hypoxia EXAM: CT CHEST, ABDOMEN, AND PELVIS WITH CONTRAST TECHNIQUE: Multidetector CT imaging of the chest, abdomen and pelvis was performed following the standard protocol during bolus administration of intravenous contrast. CONTRAST:  69mL OMNIPAQUE IOHEXOL 300 MG/ML  SOLN COMPARISON:  11/17/2018 FINDINGS: CT CHEST FINDINGS Cardiovascular: The heart and great vessels are unremarkable without pericardial effusion. Moderate atherosclerosis of the aorta and coronary vessels unchanged. No evidence of mediastinal injury. Mediastinum/Nodes: Stable heterogeneous enlargement of the left lobe thyroid. No pathologic mediastinal or hilar adenopathy. Esophagus is unremarkable. Lungs/Pleura: Scattered areas of atelectasis are seen at the lung bases. No airspace disease, effusion, or pneumothorax. Central airways are widely patent. Musculoskeletal: There are no acute displaced fractures. Reconstructed images demonstrate no additional findings. CT  ABDOMEN PELVIS FINDINGS Hepatobiliary: No focal liver abnormality is seen. No gallstones, gallbladder wall thickening, or biliary dilatation. Pancreas: Unremarkable. No pancreatic ductal dilatation or surrounding inflammatory changes. Spleen: Normal in size without focal abnormality. Adrenals/Urinary Tract: Stable severe left renal atrophy. Right kidney is  unremarkable. The adrenals are normal. No urinary tract calculi or obstruction. The bladder is unremarkable. Stomach/Bowel: No bowel obstruction or ileus. No inflammatory changes. Vascular/Lymphatic: Aortic atherosclerosis. No enlarged abdominal or pelvic lymph nodes. Reproductive: Status post hysterectomy. No adnexal masses. Other: No abdominal wall hernia or abnormality. No abdominopelvic ascites. Musculoskeletal: No acute displaced fractures. Reconstructed images demonstrate extensive multilevel thoracolumbar spondylosis. IMPRESSION: 1. No acute abnormalities involving the chest, abdomen or pelvis. 2. Aortic Atherosclerosis (ICD10-I70.0). Electronically Signed   By: Randa Ngo M.D.   On: 09/06/2019 01:53   DG Pelvis Portable  Result Date: 09/05/2019 CLINICAL DATA:  Pain. EXAM: PORTABLE PELVIS 1-2 VIEWS COMPARISON:  None. FINDINGS: There is no acute displaced fracture or dislocation. There is moderate bilateral hip osteoarthritis. There is osteopenia. Bilateral iliac vascular stents are noted. IMPRESSION: No acute displaced fracture or dislocation. Moderate bilateral hip osteoarthritis. Electronically Signed   By: Constance Holster M.D.   On: 09/05/2019 23:57   DG Chest Portable 1 View  Result Date: 09/05/2019 CLINICAL DATA:  Pain status post fall EXAM: PORTABLE CHEST 1 VIEW COMPARISON:  A 01/27/2019 FINDINGS: The heart size is stable from prior study. Aortic calcifications are noted. There are linear airspace opacities at the lung bases bilaterally, left worse than right, favored to represent atelectasis or scarring. There is no acute displaced fracture. IMPRESSION: No acute cardiopulmonary process. Electronically Signed   By: Constance Holster M.D.   On: 09/05/2019 23:59    Pertinent labs & imaging results that were available during my care of the patient were reviewed by me and considered in my medical decision making (see chart for details).  Medications Ordered in ED Medications  fentaNYL  (SUBLIMAZE) injection 50 mcg (50 mcg Intravenous Given 09/05/19 2359)  sodium chloride 0.9 % bolus 125 mL (0 mLs Intravenous Stopped 09/06/19 0141)  sodium chloride 0.9 % bolus 1,000 mL (0 mLs Intravenous Stopped 09/06/19 0235)  iohexol (OMNIPAQUE) 300 MG/ML solution 55 mL (55 mLs Intravenous Contrast Given 09/06/19 0123)  acetaminophen (TYLENOL) tablet 1,000 mg (1,000 mg Oral Given 09/06/19 0246)                                                                                                                                    Procedures Procedures  (including critical care time)  Medical Decision Making / ED Course I have reviewed the nursing notes for this encounter and the patient's prior records (if available in EHR or on provided paperwork).   Darielis Hudlow was evaluated in Emergency Department on 09/06/2019 for the symptoms described in the history of present illness. She was evaluated in the context of the global COVID-19 pandemic, which necessitated  consideration that the patient might be at risk for infection with the SARS-CoV-2 virus that causes COVID-19. Institutional protocols and algorithms that pertain to the evaluation of patients at risk for COVID-19 are in a state of rapid change based on information released by regulatory bodies including the CDC and federal and state organizations. These policies and algorithms were followed during the patient's care in the ED.  Presents as a level 2 trauma. Patient with soft blood pressures and noted to be mildly hypoxic on room air with sats of 87 to 89%. Airway intact. Secondary as above. Chest x-ray without evidence of pneumothorax or pneumonia. Pelvic x-ray without evidence of acute fracture or dislocation. CT head, chest, abdomen and pelvis were obtained revealing no acute injuries Cervical collar cleared.  Patient provided with IV fluids.  I spoke to the patient's daughter who confirmed the history from above.  Patient continued to have  soft blood pressures in hypoxia. On record review, I did not see any evidence of previous hypoxia during previous visits.  She is currently maintaining sats greater than 95% on 2 L nasal cannula.  We will call hospitalist for admission and further work-up regarding her hypoxia.      Final Clinical Impression(s) / ED Diagnoses Final diagnoses:  Fall in home, initial encounter  Hypoxia  Hypotension, unspecified hypotension type  Skin tear of upper arm without complication, right, initial encounter      This chart was dictated using voice recognition software.  Despite best efforts to proofread,  errors can occur which can change the documentation meaning.   Fatima Blank, MD 09/06/19 [phone]

## 2019-09-05 NOTE — ED Notes (Signed)
Patient placed on [place] due to decrease in O2 sats.

## 2019-09-05 NOTE — Progress Notes (Signed)
Home health orders placed for patient.   Phill Myron, D.O. Primary Care at [facility]  09/05/2019, 10:24 AM

## 2019-09-06 ENCOUNTER — Emergency Department (HOSPITAL_COMMUNITY): Payer: PPO

## 2019-09-06 ENCOUNTER — Encounter (HOSPITAL_COMMUNITY): Payer: Self-pay | Admitting: Internal Medicine

## 2019-09-06 DIAGNOSIS — J9601 Acute respiratory failure with hypoxia: Secondary | ICD-10-CM | POA: Diagnosis present

## 2019-09-06 DIAGNOSIS — N189 Chronic kidney disease, unspecified: Secondary | ICD-10-CM | POA: Diagnosis present

## 2019-09-06 DIAGNOSIS — N183 Chronic kidney disease, stage 3 unspecified: Secondary | ICD-10-CM

## 2019-09-06 DIAGNOSIS — R251 Tremor, unspecified: Secondary | ICD-10-CM | POA: Diagnosis present

## 2019-09-06 DIAGNOSIS — N179 Acute kidney failure, unspecified: Secondary | ICD-10-CM | POA: Diagnosis present

## 2019-09-06 DIAGNOSIS — W19XXXA Unspecified fall, initial encounter: Secondary | ICD-10-CM

## 2019-09-06 DIAGNOSIS — E785 Hyperlipidemia, unspecified: Secondary | ICD-10-CM

## 2019-09-06 DIAGNOSIS — I10 Essential (primary) hypertension: Secondary | ICD-10-CM

## 2019-09-06 HISTORY — DX: Essential (primary) hypertension: I10

## 2019-09-06 HISTORY — DX: Acute kidney failure, unspecified: N17.9

## 2019-09-06 HISTORY — DX: Tremor, unspecified: R25.1

## 2019-09-06 HISTORY — DX: Chronic kidney disease, stage 3 unspecified: N18.30

## 2019-09-06 HISTORY — DX: Hyperlipidemia, unspecified: E78.5

## 2019-09-06 HISTORY — DX: Acute respiratory failure with hypoxia: J96.01

## 2019-09-06 LAB — I-STAT CHEM 8, ED
BUN: 31 mg/dL — ABNORMAL HIGH (ref 8–23)
Calcium, Ion: 1.15 mmol/L (ref 1.15–1.40)
Chloride: 101 mmol/L (ref 98–111)
Creatinine, Ser: 1.7 mg/dL — ABNORMAL HIGH (ref 0.44–1.00)
Glucose, Bld: 94 mg/dL (ref 70–99)
HCT: 29 % — ABNORMAL LOW (ref 36.0–46.0)
Hemoglobin: 9.9 g/dL — ABNORMAL LOW (ref 12.0–15.0)
Potassium: 4.1 mmol/L (ref 3.5–5.1)
Sodium: 138 mmol/L (ref 135–145)
TCO2: 29 mmol/L (ref 22–32)

## 2019-09-06 LAB — SAMPLE TO BLOOD BANK

## 2019-09-06 LAB — URINALYSIS, ROUTINE W REFLEX MICROSCOPIC
Bilirubin Urine: NEGATIVE
Glucose, UA: NEGATIVE mg/dL
Hgb urine dipstick: NEGATIVE
Ketones, ur: 5 mg/dL — AB
Nitrite: NEGATIVE
Protein, ur: NEGATIVE mg/dL
Specific Gravity, Urine: 1.02 (ref 1.005–1.030)
pH: 5 (ref 5.0–8.0)

## 2019-09-06 LAB — COMPREHENSIVE METABOLIC PANEL
ALT: 10 U/L (ref 0–44)
AST: 23 U/L (ref 15–41)
Albumin: 3.4 g/dL — ABNORMAL LOW (ref 3.5–5.0)
Alkaline Phosphatase: 50 U/L (ref 38–126)
Anion gap: 17 — ABNORMAL HIGH (ref 5–15)
BUN: 30 mg/dL — ABNORMAL HIGH (ref 8–23)
CO2: 24 mmol/L (ref 22–32)
Calcium: 9.4 mg/dL (ref 8.9–10.3)
Chloride: 97 mmol/L — ABNORMAL LOW (ref 98–111)
Creatinine, Ser: 1.88 mg/dL — ABNORMAL HIGH (ref 0.44–1.00)
GFR calc Af Amer: 29 mL/min — ABNORMAL LOW (ref >60)
GFR calc non Af Amer: 25 mL/min — ABNORMAL LOW (ref >60)
Glucose, Bld: 100 mg/dL — ABNORMAL HIGH (ref 70–99)
Potassium: 4 mmol/L (ref 3.5–5.1)
Sodium: 138 mmol/L (ref 135–145)
Total Bilirubin: 1.7 mg/dL — ABNORMAL HIGH (ref 0.3–1.2)
Total Protein: 7 g/dL (ref 6.5–8.1)

## 2019-09-06 LAB — CBC
HCT: 31.5 % — ABNORMAL LOW (ref 36.0–46.0)
Hemoglobin: 10.9 g/dL — ABNORMAL LOW (ref 12.0–15.0)
MCH: 35.3 pg — ABNORMAL HIGH (ref 26.0–34.0)
MCHC: 34.6 g/dL (ref 30.0–36.0)
MCV: 101.9 fL — ABNORMAL HIGH (ref 80.0–100.0)
Platelets: 171 10*3/uL (ref 150–400)
RBC: 3.09 MIL/uL — ABNORMAL LOW (ref 3.87–5.11)
RDW: 13.5 % (ref 11.5–15.5)
WBC: 7.4 10*3/uL (ref 4.0–10.5)
nRBC: 0 % (ref 0.0–0.2)

## 2019-09-06 LAB — MRSA PCR SCREENING: MRSA by PCR: NEGATIVE

## 2019-09-06 LAB — SARS CORONAVIRUS 2 (TAT 6-24 HRS): SARS Coronavirus 2: NEGATIVE

## 2019-09-06 LAB — ETHANOL: Alcohol, Ethyl (B): <10 mg/dL (ref <10)

## 2019-09-06 LAB — PROTIME-INR
INR: 1.2 (ref 0.8–1.2)
Prothrombin Time: 14.7 seconds (ref 11.4–15.2)

## 2019-09-06 LAB — CDS SEROLOGY

## 2019-09-06 LAB — LACTIC ACID, PLASMA: Lactic Acid, Venous: 1.1 mmol/L (ref 0.5–1.9)

## 2019-09-06 MED ORDER — DORZOLAMIDE HCL-TIMOLOL MAL 2-0.5 % OP SOLN
1.0000 [drp] | Freq: Two times a day (BID) | OPHTHALMIC | Status: DC
  Administered 2019-09-06 – 2019-09-08 (×5): 1 [drp] via OPHTHALMIC
  Filled 2019-09-06: qty 10

## 2019-09-06 MED ORDER — DOCUSATE SODIUM 100 MG PO CAPS
100.0000 mg | ORAL_CAPSULE | Freq: Two times a day (BID) | ORAL | Status: DC
  Administered 2019-09-06 – 2019-09-08 (×2): 100 mg via ORAL
  Filled 2019-09-06 (×3): qty 1

## 2019-09-06 MED ORDER — ENOXAPARIN SODIUM 30 MG/0.3ML ~~LOC~~ SOLN
30.0000 mg | SUBCUTANEOUS | Status: DC
  Administered 2019-09-06 – 2019-09-07 (×2): 30 mg via SUBCUTANEOUS
  Filled 2019-09-06 (×2): qty 0.3

## 2019-09-06 MED ORDER — IOHEXOL 300 MG/ML  SOLN
55.0000 mL | Freq: Once | INTRAMUSCULAR | Status: AC | PRN
  Administered 2019-09-06: 01:00:00 55 mL via INTRAVENOUS

## 2019-09-06 MED ORDER — DIVALPROEX SODIUM 125 MG PO DR TAB
125.0000 mg | DELAYED_RELEASE_TABLET | Freq: Two times a day (BID) | ORAL | Status: DC
  Administered 2019-09-06 – 2019-09-08 (×5): 125 mg via ORAL
  Filled 2019-09-06 (×7): qty 1

## 2019-09-06 MED ORDER — SODIUM CHLORIDE 0.9% FLUSH
3.0000 mL | Freq: Two times a day (BID) | INTRAVENOUS | Status: DC
  Administered 2019-09-06 – 2019-09-08 (×3): 3 mL via INTRAVENOUS

## 2019-09-06 MED ORDER — GABAPENTIN 100 MG PO CAPS
100.0000 mg | ORAL_CAPSULE | Freq: Three times a day (TID) | ORAL | Status: DC
  Administered 2019-09-06 – 2019-09-08 (×7): 100 mg via ORAL
  Filled 2019-09-06 (×7): qty 1

## 2019-09-06 MED ORDER — ALBUTEROL SULFATE (2.5 MG/3ML) 0.083% IN NEBU
2.5000 mg | INHALATION_SOLUTION | Freq: Four times a day (QID) | RESPIRATORY_TRACT | Status: DC | PRN
  Administered 2019-09-08: 2.5 mg via RESPIRATORY_TRACT
  Filled 2019-09-06: qty 3

## 2019-09-06 MED ORDER — CLOPIDOGREL BISULFATE 75 MG PO TABS
75.0000 mg | ORAL_TABLET | Freq: Every day | ORAL | Status: DC
  Administered 2019-09-06 – 2019-09-08 (×3): 75 mg via ORAL
  Filled 2019-09-06 (×3): qty 1

## 2019-09-06 MED ORDER — ONDANSETRON HCL 4 MG PO TABS: 4.0000 mg | ORAL_TABLET | Freq: Four times a day (QID) | ORAL | Status: DC | PRN

## 2019-09-06 MED ORDER — BRIMONIDINE TARTRATE 0.15 % OP SOLN
1.0000 [drp] | Freq: Three times a day (TID) | OPHTHALMIC | Status: DC
  Administered 2019-09-06 – 2019-09-08 (×7): 1 [drp] via OPHTHALMIC
  Filled 2019-09-06 (×2): qty 5

## 2019-09-06 MED ORDER — PANTOPRAZOLE SODIUM 40 MG PO TBEC
40.0000 mg | DELAYED_RELEASE_TABLET | Freq: Every day | ORAL | Status: DC
  Administered 2019-09-06 – 2019-09-08 (×3): 40 mg via ORAL
  Filled 2019-09-06 (×3): qty 1

## 2019-09-06 MED ORDER — ACETAMINOPHEN 650 MG RE SUPP: 650.0000 mg | Freq: Four times a day (QID) | RECTAL | Status: DC | PRN

## 2019-09-06 MED ORDER — ACETAMINOPHEN 325 MG PO TABS
650.0000 mg | ORAL_TABLET | Freq: Four times a day (QID) | ORAL | Status: DC | PRN
  Administered 2019-09-06 – 2019-09-07 (×3): 650 mg via ORAL
  Filled 2019-09-06 (×3): qty 2

## 2019-09-06 MED ORDER — BUSPIRONE HCL 15 MG PO TABS
15.0000 mg | ORAL_TABLET | Freq: Three times a day (TID) | ORAL | Status: DC
  Administered 2019-09-06 – 2019-09-08 (×7): 15 mg via ORAL
  Filled 2019-09-06 (×7): qty 1

## 2019-09-06 MED ORDER — DORZOLAMIDE HCL-TIMOLOL MAL PF 22.3-6.8 MG/ML OP SOLN: 1.0000 [drp] | Freq: Two times a day (BID) | OPHTHALMIC | Status: DC

## 2019-09-06 MED ORDER — LACTATED RINGERS IV SOLN: INTRAVENOUS | Status: DC

## 2019-09-06 MED ORDER — LATANOPROST 0.005 % OP SOLN
1.0000 [drp] | Freq: Every day | OPHTHALMIC | Status: DC
  Administered 2019-09-07: 1 [drp] via OPHTHALMIC
  Filled 2019-09-06: qty 2.5

## 2019-09-06 MED ORDER — SODIUM CHLORIDE 0.9 % IV BOLUS
1000.0000 mL | Freq: Once | INTRAVENOUS | Status: AC
  Administered 2019-09-06: 02:00:00 1000 mL via INTRAVENOUS

## 2019-09-06 MED ORDER — TRAMADOL HCL 50 MG PO TABS
50.0000 mg | ORAL_TABLET | Freq: Three times a day (TID) | ORAL | Status: DC | PRN
  Administered 2019-09-07 (×2): 50 mg via ORAL
  Filled 2019-09-06 (×2): qty 1

## 2019-09-06 MED ORDER — ONDANSETRON HCL 4 MG/2ML IJ SOLN: 4.0000 mg | Freq: Four times a day (QID) | INTRAMUSCULAR | Status: DC | PRN

## 2019-09-06 MED ORDER — ACETAMINOPHEN 500 MG PO TABS
1000.0000 mg | ORAL_TABLET | Freq: Once | ORAL | Status: AC
  Administered 2019-09-06: 1000 mg via ORAL
  Filled 2019-09-06: qty 2

## 2019-09-06 NOTE — ED Notes (Signed)
Pt's daughter, [name], called and updated about her mother's status.

## 2019-09-06 NOTE — ED Notes (Signed)
Daughter Mauri Brooklyn would like an update [phone].

## 2019-09-06 NOTE — ED Notes (Signed)
Benita daughter/POA [phone] call with updates

## 2019-09-06 NOTE — Plan of Care (Signed)
Called by Dr. Lorin Mercy to discuss the patient. Frequent falls and hypoxia along with hypotension. Has been evaluated for possible cerebellar stroke versus early Parkinson's since 2015.  Has an outpatient appointment for neurology follow-up. (Refer to the other chart that the patient has which is marked for merging) I would keep the outpatient follow-up-preferably with movement disorder specialist if this has been a problem that is ongoing for months or years.  Please call us if we can be of any assistance.

## 2019-09-06 NOTE — ED Notes (Signed)
Pt sats 88-90% on room air still and c/o SOB. States that she is a former smoker but is not currently on oxygen. Placed back on oxygen @ 2 lpm.

## 2019-09-06 NOTE — ED Notes (Addendum)
Pt. was continuously coughing and complained of SOB. Pt. was assessed and found mucous obstructing airway. Pt. Suctioned to clear airway. Airway was successfully cleared.
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
## 2019-09-06 NOTE — Plan of Care (Signed)

## 2019-09-06 NOTE — ED Notes (Addendum)
Pt. remains clear of coughing.

## 2019-09-06 NOTE — H&P (Signed)
History and Physical    [name] [name] [id number] DOB: 1941-02-22 DOA: 09/05/2019  PCP: Nicolette Bang, DO Consultants:  Gwenlyn Found - cardiology Patient coming from:  Home - lives with her grandson; NOK: Daughter, [name], [phone]  Chief Complaint: Fall  HPI: [name] [name] is a 79 y.o. female with medical history significant of PMR; PVD s/p iliac stent; HTN; HLD; and stage 3 CKD with solitary kidney presenting with a fall. She was last seen on 2/2 for a televisit for worsening tremor and vertigo, with concerns for falls. Patient reports progressive tremors, appears to be primarily resting.  She has had progressive symptoms for years, worse recently.  She was in the bathroom overnight and did not turn on the light; she got twisted up in her walker and fell.  At the time of my evaluation, she had diffuse superficial injuries and was having pain from the fall.    She called her daughter about 10:30pm.  Her daughter thinks a facility for rehab would be reasonable.  She doesn't eat like she should.  ED Course:  Carryover, per Dr. Hal Hope:  History of hypertension and CHF was brought to the ER the patient had a mechanical fall at home. Scans are negative for any acute fracture. Patient has multiple ecchymotic areas. Patient in the ER is found to be hypotensive and hypoxic. Cause of which is not clear. Admit for further observation.   Review of Systems: As per HPI; otherwise review of systems reviewed and negative.   Ambulatory Status:  Ambulates with a walker  PMH, PSH, SH, and FH were reviewed - chart merge is pending   Past Medical History:  Diagnosis Date  . CKD (chronic kidney disease) stage 3, GFR 30-59 ml/min 09/06/2019  . Dyslipidemia 09/06/2019  . Essential hypertension 09/06/2019  . Tremor 09/06/2019    History reviewed. No pertinent surgical history.  Social History   Socioeconomic History  . Marital status: Widowed    Spouse name: Not on file  . Number of  children: Not on file  . Years of education: Not on file  . Highest education level: Not on file  Occupational History  . Not on file  Tobacco Use  . Smoking status: Former Research scientist (life sciences)  . Smokeless tobacco: Never Used  Substance and Sexual Activity  . Alcohol use: Never  . Drug use: Never  . Sexual activity: Not Currently  Other Topics Concern  . Not on file  Social History Narrative  . Not on file   Social Determinants of Health   Financial Resource Strain:   . Difficulty of Paying Living Expenses: Not on file  Food Insecurity:   . Worried About Charity fundraiser in the Last Year: Not on file  . Ran Out of Food in the Last Year: Not on file  Transportation Needs:   . Lack of Transportation (Medical): Not on file  . Lack of Transportation (Non-Medical): Not on file  Physical Activity:   . Days of Exercise per Week: Not on file  . Minutes of Exercise per Session: Not on file  Stress:   . Feeling of Stress : Not on file  Social Connections:   . Frequency of Communication with Friends and Family: Not on file  . Frequency of Social Gatherings with Friends and Family: Not on file  . Attends Religious Services: Not on file  . Active Member of Clubs or Organizations: Not on file  . Attends Archivist Meetings: Not on file  . Marital  Status: Not on file  Intimate Partner Violence:   . Fear of Current or Ex-Partner: Not on file  . Emotionally Abused: Not on file  . Physically Abused: Not on file  . Sexually Abused: Not on file    Allergies  Allergen Reactions  . Gabapentin   . Statins     History reviewed. No pertinent family history.  Prior to Admission medications   Not on File    Physical Exam: Vitals:   09/06/19 1130 09/06/19 1200 09/06/19 1330 09/06/19 1452  BP: 131/72 126/70 (!) 123/51 (!) 148/72  Pulse: 93 95 93 93  Resp: 16 (!) 21 13 19   Temp:   98.4 F (36.9 C) 98.4 F (36.9 C)  TempSrc:   Oral Oral  SpO2: 95% 93% 99% 97%  Weight:   69.6 kg    Height:   5\' 4"  (1.626 m)      . General:  Appears calm and comfortable and is NAD; resting UE > LE tremor . Eyes:  EOMI, normal lids, iris . ENT:  grossly normal hearing, lips & tongue, mmm . Neck:  no LAD, masses or thyromegaly . Cardiovascular:  RRR, no m/r/g. No LE edema.  [name] Respiratory:   CTA bilaterally with no wheezes/rales/rhonchi.  Normal respiratory effort. . Abdomen:  soft, NT, ND, NABS . Skin:  Excoriations and abrasions of primarily the RUE with scattered ecchymoses        . Musculoskeletal:  grossly normal tone BUE/BLE, good ROM, no bony abnormality, rigidity/cogwheeling was not appreciated . Psychiatric:  blunted mood and affect, speech fluent and appropriate, AOx3 . Neurologic:  CN 2-12 grossly intact, moves all extremities in coordinated fashion    Radiological Exams on Admission: CT HEAD WO CONTRAST  Result Date: 09/06/2019 CLINICAL DATA:  Fall, hypoxia EXAM: CT HEAD WITHOUT CONTRAST TECHNIQUE: Contiguous axial images were obtained from the base of the skull through the vertex without intravenous contrast. COMPARISON:  03/06/2019 FINDINGS: Brain: No acute infarct or hemorrhage. Chronic bilateral basal ganglia lacunar infarcts are noted. Lateral ventricles and remaining midline structures are unremarkable. No acute extra-axial fluid collections. No mass effect. Vascular: No hyperdense vessel or unexpected calcification. Skull: Normal. Negative for fracture or focal lesion. Sinuses/Orbits: No acute finding. Other: None IMPRESSION: 1. Stable chronic small vessel ischemic changes. No acute intracranial process. Electronically Signed   By: Randa Ngo M.D.   On: 09/06/2019 01:47   CT CHEST W CONTRAST  Result Date: 09/06/2019 CLINICAL DATA:  Fall, hypoxia EXAM: CT CHEST, ABDOMEN, AND PELVIS WITH CONTRAST TECHNIQUE: Multidetector CT imaging of the chest, abdomen and pelvis was performed following the standard protocol during bolus administration of intravenous  contrast. CONTRAST:  38mL OMNIPAQUE IOHEXOL 300 MG/ML  SOLN COMPARISON:  11/17/2018 FINDINGS: CT CHEST FINDINGS Cardiovascular: The heart and great vessels are unremarkable without pericardial effusion. Moderate atherosclerosis of the aorta and coronary vessels unchanged. No evidence of mediastinal injury. Mediastinum/Nodes: Stable heterogeneous enlargement of the left lobe thyroid. No pathologic mediastinal or hilar adenopathy. Esophagus is unremarkable. Lungs/Pleura: Scattered areas of atelectasis are seen at the lung bases. No airspace disease, effusion, or pneumothorax. Central airways are widely patent. Musculoskeletal: There are no acute displaced fractures. Reconstructed images demonstrate no additional findings. CT ABDOMEN PELVIS FINDINGS Hepatobiliary: No focal liver abnormality is seen. No gallstones, gallbladder wall thickening, or biliary dilatation. Pancreas: Unremarkable. No pancreatic ductal dilatation or surrounding inflammatory changes. Spleen: Normal in size without focal abnormality. Adrenals/Urinary Tract: Stable severe left renal atrophy. Right kidney is unremarkable. The adrenals  are normal. No urinary tract calculi or obstruction. The bladder is unremarkable. Stomach/Bowel: No bowel obstruction or ileus. No inflammatory changes. Vascular/Lymphatic: Aortic atherosclerosis. No enlarged abdominal or pelvic lymph nodes. Reproductive: Status post hysterectomy. No adnexal masses. Other: No abdominal wall hernia or abnormality. No abdominopelvic ascites. Musculoskeletal: No acute displaced fractures. Reconstructed images demonstrate extensive multilevel thoracolumbar spondylosis. IMPRESSION: 1. No acute abnormalities involving the chest, abdomen or pelvis. 2. Aortic Atherosclerosis (ICD10-I70.0). Electronically Signed   By: Randa Ngo M.D.   On: 09/06/2019 01:53   CT ABDOMEN PELVIS W CONTRAST  Result Date: 09/06/2019 CLINICAL DATA:  Fall, hypoxia EXAM: CT CHEST, ABDOMEN, AND PELVIS WITH  CONTRAST TECHNIQUE: Multidetector CT imaging of the chest, abdomen and pelvis was performed following the standard protocol during bolus administration of intravenous contrast. CONTRAST:  68mL OMNIPAQUE IOHEXOL 300 MG/ML  SOLN COMPARISON:  11/17/2018 FINDINGS: CT CHEST FINDINGS Cardiovascular: The heart and great vessels are unremarkable without pericardial effusion. Moderate atherosclerosis of the aorta and coronary vessels unchanged. No evidence of mediastinal injury. Mediastinum/Nodes: Stable heterogeneous enlargement of the left lobe thyroid. No pathologic mediastinal or hilar adenopathy. Esophagus is unremarkable. Lungs/Pleura: Scattered areas of atelectasis are seen at the lung bases. No airspace disease, effusion, or pneumothorax. Central airways are widely patent. Musculoskeletal: There are no acute displaced fractures. Reconstructed images demonstrate no additional findings. CT ABDOMEN PELVIS FINDINGS Hepatobiliary: No focal liver abnormality is seen. No gallstones, gallbladder wall thickening, or biliary dilatation. Pancreas: Unremarkable. No pancreatic ductal dilatation or surrounding inflammatory changes. Spleen: Normal in size without focal abnormality. Adrenals/Urinary Tract: Stable severe left renal atrophy. Right kidney is unremarkable. The adrenals are normal. No urinary tract calculi or obstruction. The bladder is unremarkable. Stomach/Bowel: No bowel obstruction or ileus. No inflammatory changes. Vascular/Lymphatic: Aortic atherosclerosis. No enlarged abdominal or pelvic lymph nodes. Reproductive: Status post hysterectomy. No adnexal masses. Other: No abdominal wall hernia or abnormality. No abdominopelvic ascites. Musculoskeletal: No acute displaced fractures. Reconstructed images demonstrate extensive multilevel thoracolumbar spondylosis. IMPRESSION: 1. No acute abnormalities involving the chest, abdomen or pelvis. 2. Aortic Atherosclerosis (ICD10-I70.0). Electronically Signed   By: Randa Ngo M.D.   On: 09/06/2019 01:53   DG Pelvis Portable  Result Date: 09/05/2019 CLINICAL DATA:  Pain. EXAM: PORTABLE PELVIS 1-2 VIEWS COMPARISON:  None. FINDINGS: There is no acute displaced fracture or dislocation. There is moderate bilateral hip osteoarthritis. There is osteopenia. Bilateral iliac vascular stents are noted. IMPRESSION: No acute displaced fracture or dislocation. Moderate bilateral hip osteoarthritis. Electronically Signed   By: Constance Holster M.D.   On: 09/05/2019 23:57   DG Chest Portable 1 View  Result Date: 09/05/2019 CLINICAL DATA:  Pain status post fall EXAM: PORTABLE CHEST 1 VIEW COMPARISON:  A 01/27/2019 FINDINGS: The heart size is stable from prior study. Aortic calcifications are noted. There are linear airspace opacities at the lung bases bilaterally, left worse than right, favored to represent atelectasis or scarring. There is no acute displaced fracture. IMPRESSION: No acute cardiopulmonary process. Electronically Signed   By: Constance Holster M.D.   On: 09/05/2019 23:59    EKG: Independently reviewed.  NSR with rate 96; no evidence of acute ischemia   Labs on Admission: I have personally reviewed the available labs and imaging studies at the time of the admission.  Pertinent labs:   BUN 30/Creatinine 1.88/GFR 25; 15/1.12/47 in 11/20 Anion gap 17 Albumin 3.4 Bili 1.7 Lactate 1.1 WBC 7.4 Hgb 10.9 INR 1.2 ETOH negative COVID Ag negative; Hologic COVID negative UA: 5 ketones, large  LE, rare bacteria   Assessment/Plan Principal Problem:   Fall at home, initial encounter Active Problems:   Acute respiratory failure with hypoxia (HCC)   Essential hypertension   Dyslipidemia   Tremor   Acute kidney injury superimposed on CKD [facility])  Fall -Patient with recurrent tremors and vertigo presenting with a fall at home  -Superficial injuries only, fortunately -Appears weak, likely associated with ?h/o Parkinson's like syndrome -PT/OT consults  requested, may need placement -Will place [facility] team consult, as well   Acute hypoxic respiratory failure -Patient with long-standing tobacco history -She was found to be hypoxic -Negative for COVID -Possible COPD as the cause -Will observe overnight with prn O2 -Continue Albuterol prn  AKI on Stage 3a CKD -Patient was hypotensive while in the ER -She has ketonuria with hypotension and apparent volume deficiency -Her daughter does report recently decreased PO intake -Will hydrate and follow  Hypotension despite h/o HTN -As noted above, this was likely primarily associated with volume depletion -She may also have autonomic dysfunction -BPs have improved today -Monitor overnight on tele, possible d/c tomorrow -Hold Lasix -Hold Norvasc for now  Tremor -Patient d/w Dr. Rory Percy -She has been evaluated for possible Parkinson's since early 2015 -Autonomic dysfunction with orthostasis can be associated with Parkinson's -However, she also was volume deficient and so this may be unrelated to her presenting complaint -F/u with outpatient neurology, as scheduled -Continue Depakote, Buspar, Neurontin  PVD/HLD -Continue Plavix -She does not appear to be taking medication for HLD at this time    Note: This patient has been tested and is negative for the novel coronavirus COVID-19.  DVT prophylaxis:  Lovenox  Code Status:  DNR - confirmed with patient Family Communication: None present; I spoke with the patient's daughter by telephone on the day of admission. Disposition Plan: She is anticipated to d/c to home with [facility] services vs go to SNF rehab - possibly as early as tomorrow. Consults called: PT/OT/TOC team; neurology by telephone only  Admission status: It is my clinical opinion that referral for OBSERVATION is reasonable and necessary in this patient based on the above information provided. The aforementioned taken together are felt to place the patient at high risk for further clinical  deterioration. However it is anticipated that the patient may be medically stable for discharge from the hospital within 24 to 48 hours.    [name] MD [facility]   How to contact the [facility] Attending or Consulting provider [place] or covering provider during after hours [place], for this patient?  1. Check the care team in [facility] and look for a) attending/consulting [facility] provider listed and b) the [facility] team listed 2. Log into [email or web address] and use [place]'s universal password to access. If you do not have the password, please contact the hospital operator. 3. Locate the [facility] provider you are looking for under [facility] and page to a number that you can be directly reached. 4. If you still have difficulty reaching the provider, please page the [facility] (Director on Call) for the Hospitalists listed on amion for assistance.   09/06/2019, 5:31 PM

## 2019-09-06 NOTE — ED Notes (Signed)
Admitting paged to RN per her request 

## 2019-09-06 NOTE — ED Notes (Signed)
Spoke with Hal Hope about [facility] for patient, current use of oxygen, and no output since arrival.

## 2019-09-06 NOTE — ED Notes (Signed)
Transported to CT 

## 2019-09-07 DIAGNOSIS — I129 Hypertensive chronic kidney disease with stage 1 through stage 4 chronic kidney disease, or unspecified chronic kidney disease: Secondary | ICD-10-CM | POA: Diagnosis present

## 2019-09-07 DIAGNOSIS — W19XXXA Unspecified fall, initial encounter: Secondary | ICD-10-CM

## 2019-09-07 DIAGNOSIS — I959 Hypotension, unspecified: Secondary | ICD-10-CM | POA: Diagnosis present

## 2019-09-07 DIAGNOSIS — R262 Difficulty in walking, not elsewhere classified: Secondary | ICD-10-CM | POA: Diagnosis present

## 2019-09-07 DIAGNOSIS — R251 Tremor, unspecified: Secondary | ICD-10-CM | POA: Diagnosis present

## 2019-09-07 DIAGNOSIS — N1831 Chronic kidney disease, stage 3a: Secondary | ICD-10-CM | POA: Diagnosis present

## 2019-09-07 DIAGNOSIS — Y9301 Activity, walking, marching and hiking: Secondary | ICD-10-CM | POA: Diagnosis present

## 2019-09-07 DIAGNOSIS — J9811 Atelectasis: Secondary | ICD-10-CM | POA: Diagnosis present

## 2019-09-07 DIAGNOSIS — R42 Dizziness and giddiness: Secondary | ICD-10-CM | POA: Diagnosis present

## 2019-09-07 DIAGNOSIS — S41111A Laceration without foreign body of right upper arm, initial encounter: Secondary | ICD-10-CM | POA: Diagnosis present

## 2019-09-07 DIAGNOSIS — Y92012 Bathroom of single-family (private) house as the place of occurrence of the external cause: Secondary | ICD-10-CM | POA: Diagnosis not present

## 2019-09-07 DIAGNOSIS — R824 Acetonuria: Secondary | ICD-10-CM | POA: Diagnosis present

## 2019-09-07 DIAGNOSIS — Z66 Do not resuscitate: Secondary | ICD-10-CM | POA: Diagnosis present

## 2019-09-07 DIAGNOSIS — W010XXA Fall on same level from slipping, tripping and stumbling without subsequent striking against object, initial encounter: Secondary | ICD-10-CM | POA: Diagnosis present

## 2019-09-07 DIAGNOSIS — F329 Major depressive disorder, single episode, unspecified: Secondary | ICD-10-CM | POA: Diagnosis present

## 2019-09-07 DIAGNOSIS — Z9181 History of falling: Secondary | ICD-10-CM | POA: Diagnosis not present

## 2019-09-07 DIAGNOSIS — G909 Disorder of the autonomic nervous system, unspecified: Secondary | ICD-10-CM | POA: Diagnosis present

## 2019-09-07 DIAGNOSIS — G8929 Other chronic pain: Secondary | ICD-10-CM | POA: Diagnosis present

## 2019-09-07 DIAGNOSIS — Z888 Allergy status to other drugs, medicaments and biological substances status: Secondary | ICD-10-CM | POA: Diagnosis not present

## 2019-09-07 DIAGNOSIS — M545 Low back pain: Secondary | ICD-10-CM | POA: Diagnosis present

## 2019-09-07 DIAGNOSIS — E869 Volume depletion, unspecified: Secondary | ICD-10-CM | POA: Diagnosis present

## 2019-09-07 DIAGNOSIS — Y92009 Unspecified place in unspecified non-institutional (private) residence as the place of occurrence of the external cause: Secondary | ICD-10-CM

## 2019-09-07 DIAGNOSIS — Z87891 Personal history of nicotine dependence: Secondary | ICD-10-CM | POA: Diagnosis not present

## 2019-09-07 DIAGNOSIS — I10 Essential (primary) hypertension: Secondary | ICD-10-CM | POA: Diagnosis not present

## 2019-09-07 DIAGNOSIS — N179 Acute kidney failure, unspecified: Secondary | ICD-10-CM | POA: Diagnosis present

## 2019-09-07 DIAGNOSIS — Z20822 Contact with and (suspected) exposure to covid-19: Secondary | ICD-10-CM | POA: Diagnosis present

## 2019-09-07 DIAGNOSIS — N189 Chronic kidney disease, unspecified: Secondary | ICD-10-CM

## 2019-09-07 DIAGNOSIS — Z9981 Dependence on supplemental oxygen: Secondary | ICD-10-CM | POA: Diagnosis not present

## 2019-09-07 DIAGNOSIS — R296 Repeated falls: Secondary | ICD-10-CM | POA: Insufficient documentation

## 2019-09-07 DIAGNOSIS — E785 Hyperlipidemia, unspecified: Secondary | ICD-10-CM | POA: Diagnosis present

## 2019-09-07 DIAGNOSIS — J9601 Acute respiratory failure with hypoxia: Secondary | ICD-10-CM | POA: Diagnosis present

## 2019-09-07 DIAGNOSIS — Z7902 Long term (current) use of antithrombotics/antiplatelets: Secondary | ICD-10-CM | POA: Diagnosis not present

## 2019-09-07 DIAGNOSIS — I739 Peripheral vascular disease, unspecified: Secondary | ICD-10-CM | POA: Diagnosis present

## 2019-09-07 LAB — BASIC METABOLIC PANEL
Anion gap: 8 (ref 5–15)
BUN: 18 mg/dL (ref 8–23)
CO2: 28 mmol/L (ref 22–32)
Calcium: 8.6 mg/dL — ABNORMAL LOW (ref 8.9–10.3)
Chloride: 106 mmol/L (ref 98–111)
Creatinine, Ser: 0.94 mg/dL (ref 0.44–1.00)
GFR calc Af Amer: >60 mL/min (ref >60)
GFR calc non Af Amer: 57 mL/min — ABNORMAL LOW (ref >60)
Glucose, Bld: 97 mg/dL (ref 70–99)
Potassium: 4 mmol/L (ref 3.5–5.1)
Sodium: 142 mmol/L (ref 135–145)

## 2019-09-07 LAB — CBC
HCT: 27.1 % — ABNORMAL LOW (ref 36.0–46.0)
Hemoglobin: 9 g/dL — ABNORMAL LOW (ref 12.0–15.0)
MCH: 33.8 pg (ref 26.0–34.0)
MCHC: 33.2 g/dL (ref 30.0–36.0)
MCV: 101.9 fL — ABNORMAL HIGH (ref 80.0–100.0)
Platelets: 149 10*3/uL — ABNORMAL LOW (ref 150–400)
RBC: 2.66 MIL/uL — ABNORMAL LOW (ref 3.87–5.11)
RDW: 13.3 % (ref 11.5–15.5)
WBC: 3.9 10*3/uL — ABNORMAL LOW (ref 4.0–10.5)
nRBC: 0 % (ref 0.0–0.2)

## 2019-09-07 MED ORDER — ENOXAPARIN SODIUM 40 MG/0.4ML ~~LOC~~ SOLN
40.0000 mg | SUBCUTANEOUS | Status: DC
  Administered 2019-09-08: 40 mg via SUBCUTANEOUS
  Filled 2019-09-07: qty 0.4

## 2019-09-07 MED ORDER — MECLIZINE HCL 12.5 MG PO TABS
12.5000 mg | ORAL_TABLET | Freq: Three times a day (TID) | ORAL | Status: DC | PRN
  Administered 2019-09-07 – 2019-09-08 (×2): 12.5 mg via ORAL
  Filled 2019-09-07 (×3): qty 1

## 2019-09-07 NOTE — Evaluation (Signed)
Occupational Therapy Evaluation Patient Details Name: [name] [name] MRN: [id number] DOB: May 14, 1941 Today's Date: 09/07/2019    History of Present Illness Pt is a 79 y.o. F who presents following a fall. Admitted in setting of frequent falls, hypoxia and hypotension. Of note, pt has been evaluated for possible cerebellar stroke versus early Parkinson's since 2015.    Clinical Impression   Pt PTA: pt living alone, falls reported and unable to properly care for self. Pt's daughter comes weekly for medication management and IADL tasks. Pt currently limited by decreased strength, decreased activity tolerance and decreased ability to care for self. Pt having back pain throughout session even after tramadol. Pt minA overall for mobility and modA overall for ADL tasks. Pt requiring increased time and current cognitive deficits. No dizziness throughout session, but pt with current limitations make her unsuitable to return home without increased supervision at home. Pt would benefit from continued OT skilled services for ADL, mobility and safety. OT following.    Follow Up Recommendations  SNF;Supervision/Assistance - 24 hour    Equipment Recommendations  3 in 1 bedside commode    Recommendations for Other Services       Precautions / Restrictions Precautions Precautions: Fall Restrictions Weight Bearing Restrictions: No      Mobility Bed Mobility Overal bed mobility: Needs Assistance             General bed mobility comments: already at EOB  Transfers Overall transfer level: Needs assistance Equipment used: Rolling walker (2 wheeled) Transfers: Sit to/from Stand Sit to Stand: Min assist         General transfer comment: minA for power up from [facility] and chair at sink    Balance Overall balance assessment: Needs assistance Sitting-balance support: Feet unsupported Sitting balance-Leahy Scale: Good     Standing balance support: Bilateral upper extremity supported Standing  balance-Leahy Scale: Poor Standing balance comment: reliant on external support                           ADL either performed or assessed with clinical judgement   ADL Overall ADL's : Needs assistance/impaired Eating/Feeding: Set up;Sitting   Grooming: Min guard;Standing Grooming Details (indicate cue type and reason): stood for rinsing mouth and washing dentures Upper Body Bathing: Minimal assistance;Standing   Lower Body Bathing: Moderate assistance;Sitting/lateral leans;Sit to/from stand   Upper Body Dressing : Set up;Sitting   Lower Body Dressing: Moderate assistance;Maximal assistance;Cueing for safety;Sitting/lateral leans;Sit to/from stand   Toilet Transfer: Min guard;Minimal assistance;Cueing for safety;Cueing for sequencing;BSC;Ambulation;RW   Toileting- Water quality scientist and Hygiene: Moderate assistance;Sitting/lateral lean;Sit to/from stand       Functional mobility during ADLs: Min guard;Minimal assistance;Rolling walker;Cueing for safety General ADL Comments: Pt limited by decreased strength, decreased activity tolerance and decreased ability to care for self. Pt having back pain throughout session even after tramadol.     Vision Baseline Vision/History: No visual deficits Vision Assessment?: No apparent visual deficits     Perception     Praxis      Pertinent Vitals/Pain Pain Assessment: Faces Faces Pain Scale: Hurts whole lot Pain Location: back Pain Descriptors / Indicators: Discomfort;Grimacing;Shooting Pain Intervention(s): Limited activity within patient's tolerance;Monitored during session;Premedicated before session     Hand Dominance Right   Extremity/Trunk Assessment Upper Extremity Assessment Upper Extremity Assessment: Generalized weakness   Lower Extremity Assessment Lower Extremity Assessment: Generalized weakness   Cervical / Trunk Assessment Cervical / Trunk Assessment: Kyphotic   Communication  Communication  Communication: Expressive difficulties;Other (comment)(increased time)   Cognition Arousal/Alertness: Awake/alert Behavior During Therapy: WFL for tasks assessed/performed Overall Cognitive Status: Impaired/Different from baseline Area of Impairment: Attention;Memory;Safety/judgement                   Current Attention Level: Sustained Memory: Decreased short-term memory   Safety/Judgement: Decreased awareness of safety;Decreased awareness of deficits     General Comments: Pt poured salt mixture into coffee instead of sugar as she did not have her glasses. Pt perseverating on wanting a new cup of coffee. OTR able to give pt a cup. Pt reports "my daughter better come here so she can pay my rent."   General Comments  O2 >94% on RA and HR ~90s with exertion; no dizziness reported    Exercises     Shoulder Instructions      Home Living Family/patient expects to be discharged to:: Private residence Living Arrangements: Alone Available Help at Discharge: Family;Available PRN/intermittently Type of Home: House Home Access: Stairs to enter [employer] of Steps: 5   Home Layout: One level     Bathroom Shower/Tub: Tub/shower unit         Home Equipment: Environmental consultant - 2 wheels;Shower seat;Wheelchair - manual;Bedside commode   Additional Comments: Daughter comes 1x weekly       Prior Functioning/Environment Level of Independence: Needs assistance  Gait / Transfers Assistance Needed: uses walker ADL's / Homemaking Assistance Needed: [employer] on Wheels, daughter sets up pt medication at the beginning of each week, pt sponge bathes            OT Problem List: Decreased strength;Decreased activity tolerance;Impaired balance (sitting and/or standing);Decreased safety awareness;Pain;Other (comment)      OT Treatment/Interventions: Self-care/ADL training;Therapeutic exercise;Energy conservation;DME and/or AE instruction;Therapeutic  activities;Visual/perceptual remediation/compensation;Patient/family education;Other (comment)    OT Goals(Current goals can be found in the care plan section) Acute Rehab OT Goals Patient Stated Goal: to be out of pain OT Goal Formulation: With patient Time For Goal Achievement: 09/22/19 Potential to Achieve Goals: Good ADL Goals Pt Will Perform Grooming: with set-up;standing Pt Will Perform Lower Body Dressing: with min guard assist;sitting/lateral leans;sit to/from stand Pt Will Transfer to Toilet: with supervision;ambulating;bedside commode Pt Will Perform Toileting - Clothing Manipulation and hygiene: with supervision;sitting/lateral leans;sit to/from stand Pt/caregiver will Perform Home Exercise Program: Increased strength;Both right and left upper extremity;With Supervision Additional ADL Goal #1: Pt will perform higher level cognitive tasks with 90Z% accuracy in 3/3 trials to assess safety and current deficits.  OT Frequency: Min 2X/week   Barriers to D/C: Decreased caregiver support  daughter comes 1x weekly, unable to increase to daily.       Co-evaluation              AM-PAC OT "6 Clicks" Daily Activity     Outcome Measure Help from another person eating meals?: A Little Help from another person taking care of personal grooming?: A Little Help from another person toileting, which includes using toliet, bedpan, or urinal?: A Lot Help from another person bathing (including washing, rinsing, drying)?: A Lot Help from another person to put on and taking off regular upper body clothing?: A Little Help from another person to put on and taking off regular lower body clothing?: A Lot 6 Click Score: 15   End of Session Equipment Utilized During Treatment: Rolling walker Nurse Communication: Mobility status  Activity Tolerance: Patient tolerated treatment well;Patient limited by pain Patient left: in chair;with call bell/phone within reach  OT Visit Diagnosis:  Unsteadiness on feet (R26.81);Muscle weakness (generalized) (M62.81);Pain Pain - part of body: (back)                Time: [id number] OT Time Calculation (min): 26 min Charges:  OT General Charges $OT Visit: 1 Visit OT Evaluation $OT Eval Moderate Complexity: 1 Mod OT Treatments $Self Care/Home Management : 8-22 mins  Jefferey Pica, OTR/L Acute Rehabilitation Services Pager: [phone] Office: [place] 09/07/2019, 9:01 AM

## 2019-09-07 NOTE — NC FL2 (Signed)
[place] LEVEL OF CARE SCREENING TOOL     IDENTIFICATION  Patient Name: [name] [name] Birthdate: 06-01-1941 Sex: female Admission Date (Current Location): 09/05/2019  [facility] and [place] Number:  Herbalist and Address:  [facility] [place], [place] [address] [place], [place] [address]      Provider Number: [id number]  Attending Physician Name and Address:  Tawni Millers  Relative Name and Phone Number:       Current Level of Care: Hospital Recommended Level of Care: [place] Prior Approval Number:    Date Approved/Denied:   PASRR Number: pending  Discharge Plan: SNF    Current Diagnoses: Patient Active Problem List   Diagnosis Date Noted  . Acute respiratory failure with hypoxia ([place]) 09/06/2019  . Essential hypertension 09/06/2019  . Dyslipidemia 09/06/2019  . CKD (chronic kidney disease) stage 3, GFR 30-59 ml/min 09/06/2019  . Tremor 09/06/2019  . Acute kidney injury superimposed on CKD ([place]) 09/06/2019  . Fall at home, initial encounter 09/06/2019    Orientation RESPIRATION BLADDER Height & Weight     Self, Time, Situation, Place  Normal Incontinent, External catheter Weight: 145 lb 4.5 oz (65.9 kg) Height:  5\' 4"  (162.6 cm)  BEHAVIORAL SYMPTOMS/MOOD NEUROLOGICAL BOWEL NUTRITION STATUS      Continent Diet(see discharge summary)  AMBULATORY STATUS COMMUNICATION OF NEEDS Skin   Extensive Assist Verbally Skin abrasions(skin tears on bilateral arms)                       Personal Care Assistance Level of Assistance  Bathing, Feeding, Dressing Bathing Assistance: Maximum assistance Feeding assistance: Independent Dressing Assistance: Maximum assistance     Functional Limitations Info  Sight, Hearing, Speech Sight Info: Adequate Hearing Info: Adequate Speech Info: Adequate    SPECIAL CARE FACTORS FREQUENCY  OT (By licensed OT), PT (By licensed PT)     PT Frequency: 5x week OT  Frequency: 5x week            Contractures Contractures Info: Not present    Additional Factors Info  Code Status, Allergies, Psychotropic Code Status Info: DNR Allergies Info: Gabapentin, Statins Psychotropic Info: busPIRone (BUSPAR) tablet 15 mg 3x daily PO         Current Medications (09/07/2019):  This is the current hospital active medication list Current Facility-Administered Medications  Medication Dose Route Frequency Provider Last Rate Last Admin  . acetaminophen (TYLENOL) tablet 650 mg  650 mg Oral Q6H PRN [name], MD   650 mg at 09/07/19 0920   Or  . acetaminophen (TYLENOL) suppository 650 mg  650 mg Rectal Q6H PRN [name], MD      . albuterol (PROVENTIL) (2.5 MG/3ML) 0.083% nebulizer solution 2.5 mg  2.5 mg Inhalation Q6H PRN [name], MD      . brimonidine (ALPHAGAN) 0.15 % ophthalmic solution 1 drop  1 drop Both Eyes TID [name], MD   1 drop at 09/07/19 0917  . busPIRone (BUSPAR) tablet 15 mg  15 mg Oral TID [name], MD   15 mg at 09/07/19 0913  . clopidogrel (PLAVIX) tablet 75 mg  75 mg Oral Daily [name], MD   75 mg at 09/07/19 0913  . divalproex (DEPAKOTE) DR tablet 125 mg  125 mg Oral BID [name], MD   125 mg at 09/07/19 0913  . docusate sodium (COLACE) capsule 100 mg  100 mg Oral BID [name], MD   100  mg at 09/06/19 2129  . dorzolamide-timolol (COSOPT) 22.3-6.8 MG/ML ophthalmic solution 1 drop  1 drop Both Eyes BID [name], MD   1 drop at 09/07/19 0920  . enoxaparin (LOVENOX) injection 30 mg  30 mg Subcutaneous Q24H [name], MD   30 mg at 09/07/19 0914  . gabapentin (NEURONTIN) capsule 100 mg  100 mg Oral TID [name], MD   100 mg at 09/07/19 0913  . lactated ringers infusion   Intravenous Continuous [name], MD 75 mL/hr at 09/07/19 0800 Rate Verify at 09/07/19 0800  . latanoprost (XALATAN) 0.005 % ophthalmic solution 1 drop  1 drop Both Eyes QHS [name], MD       . ondansetron [facility]) tablet 4 mg  4 mg Oral Q6H PRN [name], MD       Or  . ondansetron [facility]) injection 4 mg  4 mg Intravenous Q6H PRN [name], MD      . pantoprazole (PROTONIX) EC tablet 40 mg  40 mg Oral Daily [name], MD   40 mg at 09/07/19 0913  . sodium chloride flush (NS) 0.9 % injection 3 mL  3 mL Intravenous Q12H [name], MD   3 mL at 09/06/19 2135  . traMADol (ULTRAM) tablet 50 mg  50 mg Oral TID PRN [name], MD   50 mg at 09/07/19 [id number]     Discharge Medications: Please see discharge summary for a list of discharge medications.  Relevant Imaging Results:  Relevant Lab Results:   Additional Information SS# [place], [place]

## 2019-09-07 NOTE — Progress Notes (Signed)
PROGRESS NOTE    [name] [name]  [id number] DOB: 07/22/1940 DOA: 09/05/2019 PCP: Nicolette Bang, DO    Brief Narrative:  Patient was admitted to the hospital with a working diagnosis of mechanical falls, complicated by acute kidney injury.  79 year old female who presented after mechanical fall. She does have significant past medical history for peripheral vascular disease sp iliac stent, hypertension, dyslipidemia, and chronic kidney disease stage III, solitary kidney.  Patient reported chronic tremors and vertigo, associated with ambulatory dysfunction more severe for about 20 days, the night she got admitted to the hospital she was in the bathroom with the lights off, she sustained a mechanical fall while using her walker.  On her initial physical examination her blood pressure was 85/71, heart rate 93, respiratory rate 21, oxygen saturation 99% on supplemental oxygen., her lungs were clear to auscultation bilaterally, heart S1-S2 present rhythm, abdomen was soft, no lower extremity edema, she had excoriation and abrasions primary in the right upper extremity with scattered ecchymosis.  Logically patient was nonfocal. Sodium 138, potassium 4.0, chloride 97, bicarb 24, glucose 100, BUN 30, creatinine 1.88, white count 7.4, hemoglobin 10.9, hematocrit 31.5, platelets 171.  SARS COVID-19 was negative.  Specific gravity 1.020, red cells 0-5, white cells 11-20, nitrates negative.  Alcohol less than 10.  Chest radiograph had no infiltrates, left base atelectasis..  CT head with stable chronic small vessel ischemic changes.  CT chest abdomen pelvis no acute changes.  EKG 96 bpm, normal axis normal intervals, sinus rhythm, no ST segment or T wave changes.    Assessment & Plan:   Principal Problem:   Fall at home, initial encounter Active Problems:   Acute respiratory failure with hypoxia ([place])   Essential hypertension   Dyslipidemia   Tremor   Acute kidney injury superimposed on CKD  ([place])   1. Severe ambulatory dysfunction with mechanical falls. Patient not at baseline continue to have significant ambulatory dysfunction and high fall risk. Physical therapy has recommended SNF to continue physical therapy and prevent falls.  Will continue pain control with tramadol and acetaminophen. Fall precautions. Will add meclizine as needed. Continue wound care to left upper extremity post trauma wounds.   2. AKI on CKD stage 3a. Renal function with serum cr down to 0,94 with Na at 142 and serum bicarbonate at 28, K at 4,0. Patient tolerated well IV fluids with balanced electrolyte solutions, will continue close monitor of renal function and electrolytes, avoid nephrotoxic medications or hypotension. Stop IV fluids.   3. Acute hypoxic respiratory failure. Left base atelectasis, oxygen saturation this am is 98% on 1 L/ min per [place]. Will continue incentive spirometer and out of bed to chair tid with meals.   4. Hypotension. Blood pressure this am at 132/59 after IV fluids, will continue to hold on antihypertensive medications for now. Will continue telemetry for now.   5. PVD. Continue with clopidogrel for now.   DVT prophylaxis: enoxaparin   Code Status:  dnr  Family Communication: no family at the bedside  Disposition Plan/ discharge barriers: patient continue to have significant ambulatory dysfunction, high fall risk, not yet back to baseline, will require further aggressive physical and occupational therapy, will change to inpatient.   Consultants:   Neurology    Subjective: Patient continue to have significant and persistent lower back pain and vertigo, moderate to severe in intensity, worse with movement, no nausea or vomiting, no chest pain or dyspnea.   Objective: Vitals:   09/06/19 1900 09/06/19 2300 09/07/19  0200 09/07/19 0300  BP: 131/67 (!) 126/55  (!) 132/59  Pulse: 86 72  74  Resp: 13 18  17   Temp: 98.3 F (36.8 C) 98.1 F (36.7 C)  98.5 F (36.9 C)  TempSrc:  Oral Oral  Oral  SpO2: 99% 96%  98%  Weight:   65.9 kg   Height:        Intake/Output Summary (Last 24 hours) at 09/07/2019 0841 Last data filed at 09/07/2019 0800 Gross per 24 hour  Intake 1048.21 ml  Output --  Net 1048.21 ml   Filed Weights   09/05/19 2344 09/06/19 1330 09/07/19 0200  Weight: 63.5 kg 69.6 kg 65.9 kg    Examination:   General: deconditioned and ill looking apperaring Neurology: Awake and alert, non focal  E ENT: mild pallor, no icterus, oral mucosa moist Cardiovascular: No JVD. S1-S2 present, rhythmic, no gallops, rubs, or murmurs. No lower extremity edema. Pulmonary: positive breath sounds bilaterally, adequate air movement, no wheezing, rhonchi or rales. Gastrointestinal. Abdomen with no organomegaly, non tender, no rebound or guarding Skin. Multiple ecchymosis upper and lower extremities, left upper extremity post traumatic wound, linear 4 cm length.  Musculoskeletal: no joint deformities     Data Reviewed: I have personally reviewed following labs and imaging studies  CBC: Recent Labs  Lab 09/05/19 2340 09/05/19 2359 09/07/19 0231  WBC 7.4  --  3.9*  HGB 10.9* 9.9* 9.0*  HCT 31.5* 29.0* 27.1*  MCV 101.9*  --  101.9*  PLT 171  --  123456*   Basic Metabolic Panel: Recent Labs  Lab 09/05/19 2340 09/05/19 2359 09/07/19 0231  NA 138 138 142  K 4.0 4.1 4.0  CL 97* 101 106  CO2 24  --  28  GLUCOSE 100* 94 97  BUN 30* 31* 18  CREATININE 1.88* 1.70* 0.94  CALCIUM 9.4  --  8.6*   GFR: Estimated Creatinine Clearance: 44.6 mL/min (by C-G formula based on SCr of 0.94 mg/dL). Liver Function Tests: Recent Labs  Lab 09/05/19 2340  AST 23  ALT 10  ALKPHOS 50  BILITOT 1.7*  PROT 7.0  ALBUMIN 3.4*   No results for input(s): LIPASE, AMYLASE in the last 168 hours. No results for input(s): AMMONIA in the last 168 hours. Coagulation Profile: Recent Labs  Lab 09/05/19 2340  INR 1.2   Cardiac Enzymes: No results for input(s): CKTOTAL, CKMB,  CKMBINDEX, TROPONINI in the last 168 hours. BNP (last 3 results) No results for input(s): PROBNP in the last 8760 hours. HbA1C: No results for input(s): HGBA1C in the last 72 hours. CBG: No results for input(s): GLUCAP in the last 168 hours. Lipid Profile: No results for input(s): CHOL, HDL, LDLCALC, TRIG, CHOLHDL, LDLDIRECT in the last 72 hours. Thyroid Function Tests: No results for input(s): TSH, T4TOTAL, FREET4, T3FREE, THYROIDAB in the last 72 hours. Anemia Panel: No results for input(s): VITAMINB12, FOLATE, FERRITIN, TIBC, IRON, RETICCTPCT in the last 72 hours.    Radiology Studies: I have reviewed all of the imaging during this hospital visit personally     Scheduled Meds: . brimonidine  1 drop Both Eyes TID  . busPIRone  15 mg Oral TID  . clopidogrel  75 mg Oral Daily  . divalproex  125 mg Oral BID  . docusate sodium  100 mg Oral BID  . dorzolamide-timolol  1 drop Both Eyes BID  . enoxaparin (LOVENOX) injection  30 mg Subcutaneous Q24H  . gabapentin  100 mg Oral TID  . latanoprost  1 drop Both Eyes QHS  . pantoprazole  40 mg Oral Daily  . sodium chloride flush  3 mL Intravenous Q12H   Continuous Infusions: . lactated ringers 75 mL/hr at 09/07/19 0800     LOS: 0 days        Tamryn Popko Gerome Apley, MD

## 2019-09-07 NOTE — TOC Initial Note (Addendum)
Transition of Care [facility]) - Initial/Assessment Note    Patient Details  Name: [name] [name] MRN: [id number] Date of Birth: 1941-06-11  [facility] CM/SW Contact:    Gelene Mink, [place] Phone Number: 09/07/2019, 1:04 PM  Clinical Narrative:                  CSW met with the patient and daughter at bedside. Patient is agreeable to SNF. She has been to [facility], [facility], and [facility]. Patient stated she did not have a pleasant experience at [facility].   Patient has [employer]. CSW has faxed the patient out and will provide bed offers to the patient. Insurance authorization will need to be started on Monday. CSW was not able to start auth due to staff not being in the office.   CSW will continue to follow and assist with disposition planning.   Expected Discharge Plan: Skilled Nursing Facility Barriers to Discharge: Continued Medical Work up   Patient Goals and CMS Choice Patient states their goals for this hospitalization and ongoing recovery are:: Pt would like to feel better CMS Medicare.gov Compare Post Acute Care list provided to:: Patient Represenative (must comment) Choice offered to / list presented to : Patient  Expected Discharge Plan and Services Expected Discharge Plan: [place] In-house Referral: Clinical Social Work   Post Acute Care Choice: [place] Living arrangements for the past 2 months: [place]                                      Prior Living Arrangements/Services Living arrangements for the past 2 months: Single Family Home Lives with:: Self Patient language and need for interpreter reviewed:: No Do you feel safe going back to the place where you live?: Yes      Need for Family Participation in Patient Care: No (Comment) Care giver support system in place?: Yes (comment)   Criminal Activity/Legal Involvement Pertinent to Current Situation/Hospitalization: No - Comment as  needed  Activities of Daily Living Home Assistive Devices/Equipment: Bedside commode/3-in-1, Eyeglasses, Walker (specify type), Shower chair with back ADL Screening (condition at time of admission) Patient's cognitive ability adequate to safely complete daily activities?: Yes Is the patient deaf or have difficulty hearing?: No Does the patient have difficulty seeing, even when wearing glasses/contacts?: No Does the patient have difficulty concentrating, remembering, or making decisions?: Yes Patient able to express need for assistance with ADLs?: No Does the patient have difficulty dressing or bathing?: No Independently performs ADLs?: Yes (appropriate for developmental age) Does the patient have difficulty walking or climbing stairs?: Yes Weakness of Legs: Both Weakness of Arms/Hands: None  Permission Sought/Granted Permission sought to share information with : Case Manager Permission granted to share information with : Yes, Verbal Permission Granted     Permission granted to share info w AGENCY: All SNF        Emotional Assessment Appearance:: Appears stated age Attitude/Demeanor/Rapport: Engaged Affect (typically observed): Calm Orientation: : Oriented to Self, Oriented to Place, Oriented to  Time, Oriented to Situation Alcohol / Substance Use: Not Applicable Psych Involvement: No (comment)  Admission diagnosis:  Trauma [T14.90XA] Hypoxia [R09.02] Acute respiratory failure with hypoxia ([place]) [J96.01] Fall in home, initial encounter [W19.XXXA, Y92.009] Skin tear of upper arm without complication, right, initial encounter [S41.111A] Hypotension, unspecified hypotension type [I95.9] Patient Active Problem List   Diagnosis Date Noted  .  Acute respiratory failure with hypoxia ([place]) 09/06/2019  . Essential hypertension 09/06/2019  . Dyslipidemia 09/06/2019  . CKD (chronic kidney disease) stage 3, GFR 30-59 ml/min 09/06/2019  . Tremor 09/06/2019  . Acute kidney injury  superimposed on CKD ([place]) 09/06/2019  . Fall at home, initial encounter 09/06/2019   PCP:  Nicolette Bang, DO Pharmacy:   [facility], [place] [address] Phone: [phone] Fax: [phone]     Social Determinants of Health (SDOH) Interventions    Readmission Risk Interventions No flowsheet data found.

## 2019-09-08 DIAGNOSIS — R41841 Cognitive communication deficit: Secondary | ICD-10-CM | POA: Diagnosis not present

## 2019-09-08 DIAGNOSIS — Y92009 Unspecified place in unspecified non-institutional (private) residence as the place of occurrence of the external cause: Secondary | ICD-10-CM | POA: Diagnosis not present

## 2019-09-08 DIAGNOSIS — Q600 Renal agenesis, unilateral: Secondary | ICD-10-CM | POA: Diagnosis not present

## 2019-09-08 DIAGNOSIS — G4733 Obstructive sleep apnea (adult) (pediatric): Secondary | ICD-10-CM | POA: Diagnosis not present

## 2019-09-08 DIAGNOSIS — F331 Major depressive disorder, recurrent, moderate: Secondary | ICD-10-CM | POA: Diagnosis not present

## 2019-09-08 DIAGNOSIS — R262 Difficulty in walking, not elsewhere classified: Secondary | ICD-10-CM | POA: Diagnosis not present

## 2019-09-08 DIAGNOSIS — M25562 Pain in left knee: Secondary | ICD-10-CM | POA: Diagnosis not present

## 2019-09-08 DIAGNOSIS — J9601 Acute respiratory failure with hypoxia: Principal | ICD-10-CM

## 2019-09-08 DIAGNOSIS — M6281 Muscle weakness (generalized): Secondary | ICD-10-CM | POA: Diagnosis not present

## 2019-09-08 DIAGNOSIS — L98499 Non-pressure chronic ulcer of skin of other sites with unspecified severity: Secondary | ICD-10-CM | POA: Diagnosis not present

## 2019-09-08 DIAGNOSIS — S41101A Unspecified open wound of right upper arm, initial encounter: Secondary | ICD-10-CM | POA: Diagnosis not present

## 2019-09-08 DIAGNOSIS — R2689 Other abnormalities of gait and mobility: Secondary | ICD-10-CM | POA: Diagnosis not present

## 2019-09-08 DIAGNOSIS — G8929 Other chronic pain: Secondary | ICD-10-CM | POA: Diagnosis not present

## 2019-09-08 DIAGNOSIS — R5381 Other malaise: Secondary | ICD-10-CM | POA: Diagnosis not present

## 2019-09-08 DIAGNOSIS — Z9181 History of falling: Secondary | ICD-10-CM | POA: Diagnosis not present

## 2019-09-08 DIAGNOSIS — N179 Acute kidney failure, unspecified: Secondary | ICD-10-CM | POA: Diagnosis not present

## 2019-09-08 DIAGNOSIS — M47892 Other spondylosis, cervical region: Secondary | ICD-10-CM | POA: Diagnosis not present

## 2019-09-08 DIAGNOSIS — S61401D Unspecified open wound of right hand, subsequent encounter: Secondary | ICD-10-CM | POA: Diagnosis not present

## 2019-09-08 DIAGNOSIS — S61401A Unspecified open wound of right hand, initial encounter: Secondary | ICD-10-CM | POA: Diagnosis not present

## 2019-09-08 DIAGNOSIS — R0902 Hypoxemia: Secondary | ICD-10-CM | POA: Diagnosis not present

## 2019-09-08 DIAGNOSIS — M353 Polymyalgia rheumatica: Secondary | ICD-10-CM | POA: Diagnosis not present

## 2019-09-08 DIAGNOSIS — L821 Other seborrheic keratosis: Secondary | ICD-10-CM | POA: Diagnosis not present

## 2019-09-08 DIAGNOSIS — G252 Other specified forms of tremor: Secondary | ICD-10-CM | POA: Diagnosis not present

## 2019-09-08 DIAGNOSIS — E785 Hyperlipidemia, unspecified: Secondary | ICD-10-CM | POA: Diagnosis not present

## 2019-09-08 DIAGNOSIS — N1831 Chronic kidney disease, stage 3a: Secondary | ICD-10-CM | POA: Diagnosis not present

## 2019-09-08 DIAGNOSIS — R921 Mammographic calcification found on diagnostic imaging of breast: Secondary | ICD-10-CM | POA: Diagnosis not present

## 2019-09-08 DIAGNOSIS — R251 Tremor, unspecified: Secondary | ICD-10-CM | POA: Diagnosis not present

## 2019-09-08 DIAGNOSIS — H409 Unspecified glaucoma: Secondary | ICD-10-CM | POA: Diagnosis not present

## 2019-09-08 DIAGNOSIS — N189 Chronic kidney disease, unspecified: Secondary | ICD-10-CM | POA: Diagnosis not present

## 2019-09-08 DIAGNOSIS — I739 Peripheral vascular disease, unspecified: Secondary | ICD-10-CM | POA: Diagnosis not present

## 2019-09-08 DIAGNOSIS — I10 Essential (primary) hypertension: Secondary | ICD-10-CM | POA: Diagnosis not present

## 2019-09-08 DIAGNOSIS — W19XXXA Unspecified fall, initial encounter: Secondary | ICD-10-CM | POA: Diagnosis not present

## 2019-09-08 DIAGNOSIS — M810 Age-related osteoporosis without current pathological fracture: Secondary | ICD-10-CM | POA: Diagnosis not present

## 2019-09-08 DIAGNOSIS — F339 Major depressive disorder, recurrent, unspecified: Secondary | ICD-10-CM | POA: Diagnosis not present

## 2019-09-08 DIAGNOSIS — S41101D Unspecified open wound of right upper arm, subsequent encounter: Secondary | ICD-10-CM | POA: Diagnosis not present

## 2019-09-08 DIAGNOSIS — C4491 Basal cell carcinoma of skin, unspecified: Secondary | ICD-10-CM | POA: Diagnosis not present

## 2019-09-08 DIAGNOSIS — F411 Generalized anxiety disorder: Secondary | ICD-10-CM | POA: Diagnosis not present

## 2019-09-08 DIAGNOSIS — H524 Presbyopia: Secondary | ICD-10-CM | POA: Diagnosis not present

## 2019-09-08 DIAGNOSIS — N183 Chronic kidney disease, stage 3 unspecified: Secondary | ICD-10-CM | POA: Diagnosis not present

## 2019-09-08 LAB — BASIC METABOLIC PANEL
Anion gap: 9 (ref 5–15)
BUN: 9 mg/dL (ref 8–23)
CO2: 29 mmol/L (ref 22–32)
Calcium: 8.6 mg/dL — ABNORMAL LOW (ref 8.9–10.3)
Chloride: 106 mmol/L (ref 98–111)
Creatinine, Ser: 0.97 mg/dL (ref 0.44–1.00)
GFR calc Af Amer: >60 mL/min (ref >60)
GFR calc non Af Amer: 55 mL/min — ABNORMAL LOW (ref >60)
Glucose, Bld: 96 mg/dL (ref 70–99)
Potassium: 3.7 mmol/L (ref 3.5–5.1)
Sodium: 144 mmol/L (ref 135–145)

## 2019-09-08 MED ORDER — MECLIZINE HCL 12.5 MG PO TABS
12.5000 mg | ORAL_TABLET | Freq: Three times a day (TID) | ORAL | Status: DC
  Administered 2019-09-08 (×2): 12.5 mg via ORAL
  Filled 2019-09-08 (×3): qty 1

## 2019-09-08 MED ORDER — TRAMADOL HCL 50 MG PO TABS: 50.0000 mg | ORAL_TABLET | Freq: Three times a day (TID) | ORAL | 0 refills | Status: DC | PRN

## 2019-09-08 MED ORDER — MECLIZINE HCL 12.5 MG PO TABS: 12.5000 mg | ORAL_TABLET | Freq: Three times a day (TID) | ORAL | 0 refills | Status: DC | PRN

## 2019-09-08 NOTE — Plan of Care (Signed)

## 2019-09-08 NOTE — Discharge Summary (Addendum)
Physician Discharge Summary  [name] [name] [id number] DOB: 1940-09-23 DOA: 09/05/2019  PCP: Nicolette Bang, DO  Admit date: 09/05/2019 Discharge date: 09/08/2019  Admitted From: Home  Disposition:  SNF  Recommendations for Outpatient Follow-up and new medication changes:  1. Follow up with Dr. Juleen China on 7 days.  2. Patient will continue tramadol for pain control. 3. Added as needed meclizine for dizziness.   Home Health: na  Equipment/Devices: na   Discharge Condition: stable CODE STATUS: DNR  Diet recommendation: heart healthy  Brief/Interim Summary: Patient was admitted to the hospital with a working diagnosis of mechanical falls, complicated by acute kidney injury. Continue to have significant ambulatory dysfunction, now stable to transfer to SNF.   79 year old female who presented after mechanical fall. She does have significant past medical history for peripheral vascular diseasesp iliac stent,hypertension, dyslipidemia,andchronic kidney disease stage III, with solitary kidney. Patient reported chronictremors and vertigo, associated withambulatorydysfunction more severe forabout 20 days, the night she got admitted to the hospital she was walking into the bathroom with the lights off, she sustained a mechanical fall while using her walker. On her initial physical examination her blood pressure was 85/71, heart rate 93, respiratoryrate21, oxygen saturation 99% on supplemental oxygen., her lungs were clear to auscultation bilaterally, heart S1-S2 present rhythm, abdomen was soft, no lower extremity edema, she had excoriation and abrasions primary in the right upper extremity with scattered ecchymosis. Nuerologically patient was nonfocal. Sodium 138, potassium 4.0, chloride 97, bicarb 24, glucose 100, BUN 30, creatinine 1.88, white count 7.4, hemoglobin 10.9, hematocrit 31.5, platelets 171.SARS COVID-19 was negative. Specific gravity 1.020, red cells 0-5, white cells  11-20, nitrates negative.Alcohol less than 10. Chest radiograph had no infiltrates, left base atelectasis.. CT head with stable chronic small vessel ischemic changes. CT chest abdomen pelvis no acute changes. EKG 96 bpm, normal axis normal intervals, sinus rhythm, no ST segment or T wave changes.  Patient received IV fluids, supportive medical care, including wound care, physical and occupational therapy.   She continue to be very weak and deconditioned, with persistent and significant ambulatory dysfunction.   Recommendations to continue care at SNF.   1. Severe ambulatory dysfunction with mechanical falls. Patient was initially admitted to telemetry ward and received supportive medical care, including IV fluids, physical and occupational therapy. Pain control with tramadol and meclizine for dizziness.   Patient continue to have significant ambulatory dysfunction, dizziness and high fall risk.   Recommendations to transfer to SNF to continue physical/ occupational therapy.   2. AKI on CKD stage 3a. Renal function improved with IV fluids, at the time of discharge her serum cr was 0,97, K at 3,7 and serum bicarbonate at 29.   Patient tolerating po well, and IV fluids were discontinued.   3. Acute hypoxic respiratory failure. Patient had atelectasis that improved with airway clearing techniques with incentive spirometer. By the time of discharge hypoxemia had resolved. Oxymetry saturation on room air at discharge 95%.    4. Hypotension/ HTN. Initially antihypertensive agents were held to prevent further hypotension. Blood pressure improved and at discharge patient will resume antihypertensive regimen.  5. PVD. Continue with clopidogrel with good toleration.   6. Depression. On buspirone and divalproex.   Heart failure was ruled out.   Discharge Diagnoses:  Principal Problem:   Fall at home, initial encounter Active Problems:   Acute respiratory failure with hypoxia  ([place])   Essential hypertension   Dyslipidemia   Tremor   Acute kidney injury superimposed on CKD ([name])  Ambulatory dysfunction    Discharge Instructions  Discharge Instructions    Diet - low sodium heart healthy   Complete by: As directed    Discharge instructions   Complete by: As directed    Please follow with primary care in 7 days.   Increase activity slowly   Complete by: As directed      Allergies as of 09/08/2019      Reactions   Gabapentin    Statins       Medication List    TAKE these medications   albuterol 108 (90 Base) MCG/ACT inhaler Commonly known as: VENTOLIN HFA Inhale 2 puffs into the lungs every 6 (six) hours as needed for cough, wheezing or shortness of breath.   amLODipine 5 MG tablet Commonly known as: NORVASC Take 1 tablet by mouth daily.   brimonidine 0.15 % ophthalmic solution Commonly known as: ALPHAGAN Place 1 drop into both eyes 3 (three) times daily.   busPIRone 15 MG tablet Commonly known as: BUSPAR Take 1 tablet by mouth in the morning, at noon, and at bedtime.   clopidogrel 75 MG tablet Commonly known as: PLAVIX Take 1 tablet by mouth daily.   Cosopt PF 22.3-6.8 MG/ML Soln ophthalmic solution Generic drug: dorzolamidel-timolol Place 1 drop into both eyes 2 (two) times daily.   divalproex 125 MG DR tablet Commonly known as: DEPAKOTE Take 125 mg by mouth 2 (two) times daily.   ferrous sulfate 325 (65 FE) MG tablet Take 325 mg by mouth daily.   furosemide 20 MG tablet Commonly known as: LASIX Take 20 mg by mouth daily.   gabapentin 100 MG capsule Commonly known as: NEURONTIN Take 100 mg by mouth 3 (three) times daily.   meclizine 12.5 MG tablet Commonly known as: ANTIVERT Take 1 tablet (12.5 mg total) by mouth 3 (three) times daily as needed for dizziness.   pantoprazole 40 MG tablet Commonly known as: PROTONIX Take 40 mg by mouth daily.   traMADol 50 MG tablet Commonly known as: ULTRAM Take 1 tablet (50 mg  total) by mouth 3 (three) times daily as needed for moderate pain. What changed: reasons to take this   VITAMIN C PO Take 1 tablet by mouth daily.   Vyzulta 0.024 % Soln Generic drug: Latanoprostene Bunod Apply 1 drop to eye at bedtime.       Contact information for follow-up providers    Nicolette Bang, DO Follow up in 1 week(s).   Specialty: Family Medicine Contact information: [place] [address] [phone]            Contact information for after-discharge care    Destination    [facility] Preferred SNF .   Service: Skilled Nursing Contact information: [address] [address] [phone]                 Allergies  Allergen Reactions  . Gabapentin   . Statins       Procedures/Studies: CT HEAD WO CONTRAST  Result Date: 09/06/2019 CLINICAL DATA:  Fall, hypoxia EXAM: CT HEAD WITHOUT CONTRAST TECHNIQUE: Contiguous axial images were obtained from the base of the skull through the vertex without intravenous contrast. COMPARISON:  03/06/2019 FINDINGS: Brain: No acute infarct or hemorrhage. Chronic bilateral basal ganglia lacunar infarcts are noted. Lateral ventricles and remaining midline structures are unremarkable. No acute extra-axial fluid collections. No mass effect. Vascular: No hyperdense vessel or unexpected calcification. Skull: Normal. Negative for fracture or focal lesion. Sinuses/Orbits:  No acute finding. Other: None IMPRESSION: 1. Stable chronic small vessel ischemic changes. No acute intracranial process. Electronically Signed   By: Randa Ngo M.D.   On: 09/06/2019 01:47   CT CHEST W CONTRAST  Result Date: 09/06/2019 CLINICAL DATA:  Fall, hypoxia EXAM: CT CHEST, ABDOMEN, AND PELVIS WITH CONTRAST TECHNIQUE: Multidetector CT imaging of the chest, abdomen and pelvis was performed following the standard protocol during bolus administration of intravenous contrast. CONTRAST:  71mL  OMNIPAQUE IOHEXOL 300 MG/ML  SOLN COMPARISON:  11/17/2018 FINDINGS: CT CHEST FINDINGS Cardiovascular: The heart and great vessels are unremarkable without pericardial effusion. Moderate atherosclerosis of the aorta and coronary vessels unchanged. No evidence of mediastinal injury. Mediastinum/Nodes: Stable heterogeneous enlargement of the left lobe thyroid. No pathologic mediastinal or hilar adenopathy. Esophagus is unremarkable. Lungs/Pleura: Scattered areas of atelectasis are seen at the lung bases. No airspace disease, effusion, or pneumothorax. Central airways are widely patent. Musculoskeletal: There are no acute displaced fractures. Reconstructed images demonstrate no additional findings. CT ABDOMEN PELVIS FINDINGS Hepatobiliary: No focal liver abnormality is seen. No gallstones, gallbladder wall thickening, or biliary dilatation. Pancreas: Unremarkable. No pancreatic ductal dilatation or surrounding inflammatory changes. Spleen: Normal in size without focal abnormality. Adrenals/Urinary Tract: Stable severe left renal atrophy. Right kidney is unremarkable. The adrenals are normal. No urinary tract calculi or obstruction. The bladder is unremarkable. Stomach/Bowel: No bowel obstruction or ileus. No inflammatory changes. Vascular/Lymphatic: Aortic atherosclerosis. No enlarged abdominal or pelvic lymph nodes. Reproductive: Status post hysterectomy. No adnexal masses. Other: No abdominal wall hernia or abnormality. No abdominopelvic ascites. Musculoskeletal: No acute displaced fractures. Reconstructed images demonstrate extensive multilevel thoracolumbar spondylosis. IMPRESSION: 1. No acute abnormalities involving the chest, abdomen or pelvis. 2. Aortic Atherosclerosis (ICD10-I70.0). Electronically Signed   By: Randa Ngo M.D.   On: 09/06/2019 01:53   CT ABDOMEN PELVIS W CONTRAST  Result Date: 09/06/2019 CLINICAL DATA:  Fall, hypoxia EXAM: CT CHEST, ABDOMEN, AND PELVIS WITH CONTRAST TECHNIQUE:  Multidetector CT imaging of the chest, abdomen and pelvis was performed following the standard protocol during bolus administration of intravenous contrast. CONTRAST:  109mL OMNIPAQUE IOHEXOL 300 MG/ML  SOLN COMPARISON:  11/17/2018 FINDINGS: CT CHEST FINDINGS Cardiovascular: The heart and great vessels are unremarkable without pericardial effusion. Moderate atherosclerosis of the aorta and coronary vessels unchanged. No evidence of mediastinal injury. Mediastinum/Nodes: Stable heterogeneous enlargement of the left lobe thyroid. No pathologic mediastinal or hilar adenopathy. Esophagus is unremarkable. Lungs/Pleura: Scattered areas of atelectasis are seen at the lung bases. No airspace disease, effusion, or pneumothorax. Central airways are widely patent. Musculoskeletal: There are no acute displaced fractures. Reconstructed images demonstrate no additional findings. CT ABDOMEN PELVIS FINDINGS Hepatobiliary: No focal liver abnormality is seen. No gallstones, gallbladder wall thickening, or biliary dilatation. Pancreas: Unremarkable. No pancreatic ductal dilatation or surrounding inflammatory changes. Spleen: Normal in size without focal abnormality. Adrenals/Urinary Tract: Stable severe left renal atrophy. Right kidney is unremarkable. The adrenals are normal. No urinary tract calculi or obstruction. The bladder is unremarkable. Stomach/Bowel: No bowel obstruction or ileus. No inflammatory changes. Vascular/Lymphatic: Aortic atherosclerosis. No enlarged abdominal or pelvic lymph nodes. Reproductive: Status post hysterectomy. No adnexal masses. Other: No abdominal wall hernia or abnormality. No abdominopelvic ascites. Musculoskeletal: No acute displaced fractures. Reconstructed images demonstrate extensive multilevel thoracolumbar spondylosis. IMPRESSION: 1. No acute abnormalities involving the chest, abdomen or pelvis. 2. Aortic Atherosclerosis (ICD10-I70.0). Electronically Signed   By: Randa Ngo M.D.   On:  09/06/2019 01:53   DG Pelvis Portable  Result Date: 09/05/2019 CLINICAL DATA:  Pain. EXAM: PORTABLE PELVIS 1-2 VIEWS COMPARISON:  None. FINDINGS: There is no acute displaced fracture or dislocation. There is moderate bilateral hip osteoarthritis. There is osteopenia. Bilateral iliac vascular stents are noted. IMPRESSION: No acute displaced fracture or dislocation. Moderate bilateral hip osteoarthritis. Electronically Signed   By: Constance Holster M.D.   On: 09/05/2019 23:57   DG Chest Portable 1 View  Result Date: 09/05/2019 CLINICAL DATA:  Pain status post fall EXAM: PORTABLE CHEST 1 VIEW COMPARISON:  A 01/27/2019 FINDINGS: The heart size is stable from prior study. Aortic calcifications are noted. There are linear airspace opacities at the lung bases bilaterally, left worse than right, favored to represent atelectasis or scarring. There is no acute displaced fracture. IMPRESSION: No acute cardiopulmonary process. Electronically Signed   By: Constance Holster M.D.   On: 09/05/2019 23:59     Subjective: Patient continue to have significant dizziness and vertigo, worse with movement, no associated nausea or vomiting, no chest pain or dyspnea.   Discharge Exam: Vitals:   09/08/19 1127 09/08/19 1633  BP: (!) 145/61 (!) 120/59  Pulse: 79 63  Resp: 19 13  Temp: 97.6 F (36.4 C) 98.6 F (37 C)  SpO2: 95% 95%   Vitals:   09/08/19 0332 09/08/19 0743 09/08/19 1127 09/08/19 1633  BP: (!) 140/59 (!) 155/72 (!) 145/61 (!) 120/59  Pulse: (!) 58 64 79 63  Resp: 11 15 19 13   Temp: 98.4 F (36.9 C) (!) 97.3 F (36.3 C) 97.6 F (36.4 C) 98.6 F (37 C)  TempSrc: Oral Oral Oral Oral  SpO2: 99% 98% 95% 95%  Weight:      Height:        General: Not in pain or dyspnea, deconditioned  Neurology: Awake and alert, non focal  E ENT: mild pallor, no icterus, oral mucosa moist Cardiovascular: No JVD. S1-S2 present, rhythmic, no gallops, rubs, or murmurs. Non pitting lower extremity  edema. Pulmonary: positive breath sounds bilaterally, adequate air movement, no wheezing, rhonchi or rales. Gastrointestinal. Abdomen with no organomegaly, non tender, no rebound or guarding Skin. No rashes Musculoskeletal: no joint deformities   The results of significant diagnostics from this hospitalization (including imaging, microbiology, ancillary and laboratory) are listed below for reference.     Microbiology: Recent Results (from the past 240 hour(s))  SARS CORONAVIRUS 2 (TAT 6-24 HRS) Nasopharyngeal Nasopharyngeal Swab     Status: None   Collection Time: 09/06/19  4:57 AM   Specimen: Nasopharyngeal Swab  Result Value Ref Range Status   SARS Coronavirus 2 NEGATIVE NEGATIVE Final    Comment: (NOTE) SARS-CoV-2 target nucleic acids are NOT DETECTED. The SARS-CoV-2 RNA is generally detectable in upper and lower respiratory specimens during the acute phase of infection. Negative results do not preclude SARS-CoV-2 infection, do not rule out co-infections with other pathogens, and should not be used as the sole basis for treatment or other patient management decisions. Negative results must be combined with clinical observations, patient history, and epidemiological information. The expected result is Negative. Fact Sheet for Patients: SugarRoll.be Fact Sheet for Healthcare Providers: https://www.woods-mathews.com/ This test is not yet approved or cleared by the [place] FDA and  has been authorized for detection and/or diagnosis of SARS-CoV-2 by FDA under an Emergency Use Authorization (EUA). This EUA will remain  in effect (meaning this test can be used) for the duration of the COVID-19 declaration under Section 56 4(b)(1) of the Act, 21 U.S.C. section 360bbb-3(b)(1), unless the authorization is terminated or revoked sooner. Performed  at [facility], [place] [address], [place], [place] [address]   MRSA PCR Screening      Status: None   Collection Time: 09/06/19  1:45 PM   Specimen: Nasal Mucosa; Nasopharyngeal  Result Value Ref Range Status   MRSA by PCR NEGATIVE NEGATIVE Final    Comment:        The GeneXpert MRSA Assay (FDA approved for NASAL specimens only), is one component of a comprehensive MRSA colonization surveillance program. It is not intended to diagnose MRSA infection nor to guide or monitor treatment for MRSA infections. Performed at [facility], [place] [address], [place],  [address]      Labs: BNP (last 3 results) No results for input(s): BNP in the last 8760 hours. Basic Metabolic Panel: Recent Labs  Lab 09/05/19 2340 09/05/19 2359 09/07/19 0231 09/08/19 0133  NA 138 138 142 144  K 4.0 4.1 4.0 3.7  CL 97* 101 106 106  CO2 24  --  28 29  GLUCOSE 100* 94 97 96  BUN 30* 31* 18 9  CREATININE 1.88* 1.70* 0.94 0.97  CALCIUM 9.4  --  8.6* 8.6*   Liver Function Tests: Recent Labs  Lab 09/05/19 2340  AST 23  ALT 10  ALKPHOS 50  BILITOT 1.7*  PROT 7.0  ALBUMIN 3.4*   No results for input(s): LIPASE, AMYLASE in the last 168 hours. No results for input(s): AMMONIA in the last 168 hours. CBC: Recent Labs  Lab 09/05/19 2340 09/05/19 2359 09/07/19 0231  WBC 7.4  --  3.9*  HGB 10.9* 9.9* 9.0*  HCT 31.5* 29.0* 27.1*  MCV 101.9*  --  101.9*  PLT 171  --  149*   Cardiac Enzymes: No results for input(s): CKTOTAL, CKMB, CKMBINDEX, TROPONINI in the last 168 hours. BNP: Invalid input(s): POCBNP CBG: No results for input(s): GLUCAP in the last 168 hours. D-Dimer No results for input(s): DDIMER in the last 72 hours. Hgb A1c No results for input(s): HGBA1C in the last 72 hours. Lipid Profile No results for input(s): CHOL, HDL, LDLCALC, TRIG, CHOLHDL, LDLDIRECT in the last 72 hours. Thyroid function studies No results for input(s): TSH, T4TOTAL, T3FREE, THYROIDAB in the last 72 hours.  Invalid input(s): FREET3 Anemia work up No results for input(s):  VITAMINB12, FOLATE, FERRITIN, TIBC, IRON, RETICCTPCT in the last 72 hours. Urinalysis    Component Value Date/Time   COLORURINE YELLOW 09/06/2019 1047   APPEARANCEUR CLEAR 09/06/2019 1047   LABSPEC 1.020 09/06/2019 1047   PHURINE 5.0 09/06/2019 1047   GLUCOSEU NEGATIVE 09/06/2019 1047   HGBUR NEGATIVE 09/06/2019 1047   [place] 09/06/2019 1047   KETONESUR 5 (A) 09/06/2019 1047   PROTEINUR NEGATIVE 09/06/2019 1047   NITRITE NEGATIVE 09/06/2019 1047   LEUKOCYTESUR LARGE (A) 09/06/2019 1047   Sepsis Labs Invalid input(s): PROCALCITONIN,  WBC,  LACTICIDVEN Microbiology Recent Results (from the past 240 hour(s))  SARS CORONAVIRUS 2 (TAT 6-24 HRS) Nasopharyngeal Nasopharyngeal Swab     Status: None   Collection Time: 09/06/19  4:57 AM   Specimen: Nasopharyngeal Swab  Result Value Ref Range Status   SARS Coronavirus 2 NEGATIVE NEGATIVE Final    Comment: (NOTE) SARS-CoV-2 target nucleic acids are NOT DETECTED. The SARS-CoV-2 RNA is generally detectable in upper and lower respiratory specimens during the acute phase of infection. Negative results do not preclude SARS-CoV-2 infection, do not rule out co-infections with other pathogens, and should not be used as the sole basis for treatment or other  patient management decisions. Negative results must be combined with clinical observations, patient history, and epidemiological information. The expected result is Negative. Fact Sheet for Patients: SugarRoll.be Fact Sheet for Healthcare Providers: https://www.woods-mathews.com/ This test is not yet approved or cleared by the [place] FDA and  has been authorized for detection and/or diagnosis of SARS-CoV-2 by FDA under an Emergency Use Authorization (EUA). This EUA will remain  in effect (meaning this test can be used) for the duration of the COVID-19 declaration under Section 56 4(b)(1) of the Act, 21 U.S.C. section  360bbb-3(b)(1), unless the authorization is terminated or revoked sooner. Performed at [facility], [place] [address], [place], [place] [address]   MRSA PCR Screening     Status: None   Collection Time: 09/06/19  1:45 PM   Specimen: Nasal Mucosa; Nasopharyngeal  Result Value Ref Range Status   MRSA by PCR NEGATIVE NEGATIVE Final    Comment:        The GeneXpert MRSA Assay (FDA approved for NASAL specimens only), is one component of a comprehensive MRSA colonization surveillance program. It is not intended to diagnose MRSA infection nor to guide or monitor treatment for MRSA infections. Performed at [facility], [place] [address], [place], [place] [address]      Time coordinating discharge: 45 minutes  SIGNED:   Tawni Millers, MD  [facility] 09/08/2019, 4:56 PM

## 2019-09-08 NOTE — Progress Notes (Signed)
Physical Therapy Treatment Patient Details Name: [name] [name] MRN: [id number] DOB: [birth year] Today's Date: 09/08/2019    History of Present Illness Pt is a 79 y.o. F who presents following a fall. Admitted in setting of frequent falls, hypoxia and hypotension. Of note, pt has been evaluated for possible cerebellar stroke versus early Parkinson's since 2015.     PT Comments    Pt reports dizziness to be the same but the vestibular treatment on Saturday worked great. Pt treated again with canal repositioning test at EOB again today. Pt with onset of dizziness but quickly dissipated (within a minute). Pt able to amb 40' today with RW. Pt remains appropriate for SNF upon d/c as pt unsafe to care for self and remains at high falls risk. Acute PT to cont to follow.    Follow Up Recommendations  SNF;Supervision/Assistance - 24 hour     Equipment Recommendations  3in1 (PT)    Recommendations for Other Services       Precautions / Restrictions Precautions Precautions: Fall Restrictions Weight Bearing Restrictions: No    Mobility  Bed Mobility Overal bed mobility: Needs Assistance Bed Mobility: Supine to Sit     Supine to sit: Min guard     General bed mobility comments: HOB elevated, min guard due to safety due to onset of dizziness with position change  Transfers Overall transfer level: Needs assistance Equipment used: Rolling walker (2 wheeled) Transfers: Sit to/from Stand Sit to Stand: Min guard         General transfer comment: min guard to steady, no physical assist needed, verbal cues for safe hand placement  Ambulation/Gait Ambulation/Gait assistance: Min assist Gait Distance (Feet): 40 Feet Assistive device: Rolling walker (2 wheeled) Gait Pattern/deviations: Step-through pattern;Decreased stride length Gait velocity: decreased Gait velocity interpretation: <1.31 ft/sec, indicative of household ambulator General Gait Details: pt slow due to guarding s/p canal  positioning treatment for BPPV   Stairs             Wheelchair Mobility    Modified Rankin (Stroke Patients Only)       Balance Overall balance assessment: Needs assistance Sitting-balance support: Feet unsupported Sitting balance-Leahy Scale: Good     Standing balance support: Bilateral upper extremity supported Standing balance-Leahy Scale: Fair Standing balance comment: requires use of RW for support                            Cognition Arousal/Alertness: Awake/alert Behavior During Therapy: WFL for tasks assessed/performed Overall Cognitive Status: Impaired/Different from baseline Area of Impairment: Memory                   Current Attention Level: Selective Memory: Decreased short-term memory         General Comments: pt appears more intune cognitively today compared to previous PT session      Exercises      General Comments General comments (skin integrity, edema, etc.): Pt treated for R posterior canal BPPV at EOB with canal positioning at EOB      Pertinent Vitals/Pain Pain Assessment: 0-10 Pain Score: 5  Pain Location: back Pain Descriptors / Indicators: Discomfort Pain Intervention(s): Monitored during session    Home Living                      Prior Function            PT Goals (current goals can now be found in the care  plan section) Progress towards PT goals: Progressing toward goals    Frequency    Min 3X/week      PT Plan Current plan remains appropriate    Co-evaluation              AM-PAC PT "6 Clicks" Mobility   Outcome Measure  Help needed turning from your back to your side while in a flat bed without using bedrails?: None Help needed moving from lying on your back to sitting on the side of a flat bed without using bedrails?: None Help needed moving to and from a bed to a chair (including a wheelchair)?: A Little Help needed standing up from a chair using your arms (e.g.,  wheelchair or bedside chair)?: A Little Help needed to walk in hospital room?: A Little Help needed climbing 3-5 steps with a railing? : A Lot 6 Click Score: 19    End of Session Equipment Utilized During Treatment: Gait belt Activity Tolerance: Patient tolerated treatment well Patient left: in chair;with call bell/phone within reach Nurse Communication: Mobility status;Patient requests pain meds PT Visit Diagnosis: Unsteadiness on feet (R26.81);History of falling (Z91.81);BPPV BPPV - Right/Left : Right     Time: [id number] PT Time Calculation (min) (ACUTE ONLY): 24 min  Charges:  $Gait Training: 8-22 mins $Canalith Rep Proc: 8-22 mins                     Kittie Plater, PT, DPT Acute Rehabilitation Services Pager #: [phone] Office #: [phone]    Berline Lopes 09/08/2019, 1:49 PM

## 2019-09-08 NOTE — TOC Transition Note (Signed)
Transition of Care [facility]) - CM/SW Discharge Note   Patient Details  Name: [name] [name] MRN: [id number] Date of Birth: Sep 28, 1940  [facility] CM/SW Contact:  Arvella Merles, LCSW Phone Number: 09/08/2019, 5:23 PM   Clinical Narrative:    Patient will DC to: [facility] Anticipated DC date: 09/08/19 Family notified: [name], daughter  Transport by: Corey Harold   Per MD patient ready for DC to [facility]. RN, patient, patient's family, and facility notified of DC. Discharge Summary and FL2 sent to facility. RN to call report prior to discharge [phone]). DC packet on chart. Ambulance transport requested for patient.   CSW will sign off for now as social work intervention is no longer needed. Please consult [place] again if new needs arise.   Final next level of care: Skilled Nursing Facility Barriers to Discharge: No Barriers Identified   Patient Goals and CMS Choice Patient states their goals for this hospitalization and ongoing recovery are:: Pt would like to feel better CMS Medicare.gov Compare Post Acute Care list provided to:: Patient Choice offered to / list presented to : Patient  Discharge Placement   Existing PASRR number confirmed : 09/07/19          Patient chooses bed at: [facility] Patient to be transferred to facility by: [place] Name of family member notified: [name], daughter Patient and family notified of of transfer: 09/08/19  Discharge Plan and Services In-house Referral: Clinical Social Work   Post Acute Care Choice: [place]                               Social Determinants of Health ([place]) Interventions     Readmission Risk Interventions No flowsheet data found.

## 2019-09-08 NOTE — Progress Notes (Signed)
RN called [facility], gave report to nurse, Traci. All questions answered. PTAR picked up pt at [phone].

## 2019-09-08 NOTE — TOC Progression Note (Signed)
Transition of Care [facility]) - Progression Note    Patient Details  Name: [name] [name] MRN: [id number] Date of Birth: Jul 30, 1940  Transition of Care [facility]) CM/SW [place], [place] Phone Number: 09/08/2019, 9:59 AM  Clinical Narrative:    CSW met with patient bedside and provided her with bed offers received over the weekend. Patient stated she would like to go to [facility] and provided CSW with permission to contact her daughter [name] to update her. CSW made telephone call to patient's daughter and provided an update.   CSW contacted [facility] and verified they are able to take patient.   Insurance authorization has been initiated and non-emergent ambulance transport form has been faxed. Discharge pending insurance authorization. CSW will continue to follow and assist with discharge planning needs.   Expected Discharge Plan: [place] Barriers to Discharge: Continued Medical Work up  Expected Discharge Plan and Services Expected Discharge Plan: [place] In-house Referral: Clinical Social Work   Post Acute Care Choice: [place] Living arrangements for the past 2 months: Single Family Home                                       Social Determinants of Health (SDOH) Interventions    Readmission Risk Interventions No flowsheet data found.

## 2019-09-08 NOTE — Progress Notes (Signed)
PROGRESS NOTE    [name] [name]  [id number] DOB: 06-30-1941 DOA: 09/05/2019 PCP: Nicolette Bang, DO    Brief Narrative:  Patient was admitted to the hospital with a working diagnosis of mechanical falls, complicated by acute kidney injury. Continue to have significant ambulatory dysfunction, now pending SNF.   79 year old female who presented after mechanical fall. She does have significant past medical history for peripheral vascular disease sp iliac stent, hypertension, dyslipidemia, and chronic kidney disease stage III, solitary kidney.  Patient reported chronic tremors and vertigo, associated with ambulatory dysfunction more severe for about 20 days, the night she got admitted to the hospital she was in the bathroom with the lights off, she sustained a mechanical fall while using her walker.  On her initial physical examination her blood pressure was 85/71, heart rate 93, respiratory rate 21, oxygen saturation 99% on supplemental oxygen., her lungs were clear to auscultation bilaterally, heart S1-S2 present rhythm, abdomen was soft, no lower extremity edema, she had excoriation and abrasions primary in the right upper extremity with scattered ecchymosis.  Logically patient was nonfocal. Sodium 138, potassium 4.0, chloride 97, bicarb 24, glucose 100, BUN 30, creatinine 1.88, white count 7.4, hemoglobin 10.9, hematocrit 31.5, platelets 171.  SARS COVID-19 was negative.  Specific gravity 1.020, red cells 0-5, white cells 11-20, nitrates negative.  Alcohol less than 10.  Chest radiograph had no infiltrates, left base atelectasis..  CT head with stable chronic small vessel ischemic changes.  CT chest abdomen pelvis no acute changes.  EKG 96 bpm, normal axis normal intervals, sinus rhythm, no ST segment or T wave changes.  Patient continue to be very weak and deconditioned, continue to have significant ambulatory dysfunction. Plan for SNF.   Assessment & Plan:   Principal Problem:   Fall  at home, initial encounter Active Problems:   Acute respiratory failure with hypoxia ([place])   Essential hypertension   Dyslipidemia   Tremor   Acute kidney injury superimposed on CKD ([place])   Ambulatory dysfunction   1. Severe ambulatory dysfunction with mechanical falls. Persistent  significant ambulatory dysfunction with vertigo and high fall risk.   Change meclizine to tid scheduled, continue pain control with tramadol gabapentin and acetaminophen. On wound care to left upper extremity post trauma wounds. Patient has been sinus rhythm, will discontinue telemetry.   Pending transfer to SNF.   2. AKI on CKD stage 3a. Stable renal function with serum cr at 0,97, K at 3,7 and serum bicarbonate at 29. Patient now off IV fluids and tolerating po well  3. Acute hypoxic respiratory failure. Clinically resolved.   4. Hypotension/ HTN. Blood pressure 155/72, will continue to hold on antihypertensive medications for now.   5. PVD. On clopidogrel with good toleration.    6. Depression. Continue with buspirone and divalproex.   DVT prophylaxis: enoxaparin   Code Status:  dnr  Family Communication: no family at the bedside  Disposition Plan/ discharge barriers: pending SNF.  Nutrition Status:     Subjective: Patient continue to have significant dizziness and vertigo, worse with movement, no associated nausea or vomiting, no chest pain or dyspnea.   Objective: Vitals:   09/07/19 1900 09/07/19 2326 09/08/19 0332 09/08/19 0743  BP: (!) 151/57 129/63 (!) 140/59 (!) 155/72  Pulse: 67 63 (!) 58 64  Resp: 18 14 11 15   Temp: 98.8 F (37.1 C) 97.7 F (36.5 C) 98.4 F (36.9 C) (!) 97.3 F (36.3 C)  TempSrc: Oral Oral Oral Oral  SpO2: 99% 98%  99% 98%  Weight:      Height:        Intake/Output Summary (Last 24 hours) at 09/08/2019 0818 Last data filed at 09/07/2019 1900 Gross per 24 hour  Intake 990 ml  Output 300 ml  Net 690 ml   Filed Weights   09/05/19 2344 09/06/19 1330  09/07/19 0200  Weight: 63.5 kg 69.6 kg 65.9 kg    Examination:   General: Not in pain or dyspnea, deconditioned  Neurology: Awake and alert, non focal  E ENT: mild pallor, no icterus, oral mucosa moist Cardiovascular: No JVD. S1-S2 present, rhythmic, no gallops, rubs, or murmurs. Non pitting lower extremity edema. Pulmonary: positive breath sounds bilaterally, adequate air movement, no wheezing, rhonchi or rales. Gastrointestinal. Abdomen with no organomegaly, non tender, no rebound or guarding Skin. No rashes Musculoskeletal: no joint deformities     Data Reviewed: I have personally reviewed following labs and imaging studies  CBC: Recent Labs  Lab 09/05/19 2340 09/05/19 2359 09/07/19 0231  WBC 7.4  --  3.9*  HGB 10.9* 9.9* 9.0*  HCT 31.5* 29.0* 27.1*  MCV 101.9*  --  101.9*  PLT 171  --  123456*   Basic Metabolic Panel: Recent Labs  Lab 09/05/19 2340 09/05/19 2359 09/07/19 0231 09/08/19 0133  NA 138 138 142 144  K 4.0 4.1 4.0 3.7  CL 97* 101 106 106  CO2 24  --  28 29  GLUCOSE 100* 94 97 96  BUN 30* 31* 18 9  CREATININE 1.88* 1.70* 0.94 0.97  CALCIUM 9.4  --  8.6* 8.6*   GFR: Estimated Creatinine Clearance: 43.2 mL/min (by C-G formula based on SCr of 0.97 mg/dL). Liver Function Tests: Recent Labs  Lab 09/05/19 2340  AST 23  ALT 10  ALKPHOS 50  BILITOT 1.7*  PROT 7.0  ALBUMIN 3.4*   No results for input(s): LIPASE, AMYLASE in the last 168 hours. No results for input(s): AMMONIA in the last 168 hours. Coagulation Profile: Recent Labs  Lab 09/05/19 2340  INR 1.2   Cardiac Enzymes: No results for input(s): CKTOTAL, CKMB, CKMBINDEX, TROPONINI in the last 168 hours. BNP (last 3 results) No results for input(s): PROBNP in the last 8760 hours. HbA1C: No results for input(s): HGBA1C in the last 72 hours. CBG: No results for input(s): GLUCAP in the last 168 hours. Lipid Profile: No results for input(s): CHOL, HDL, LDLCALC, TRIG, CHOLHDL, LDLDIRECT in  the last 72 hours. Thyroid Function Tests: No results for input(s): TSH, T4TOTAL, FREET4, T3FREE, THYROIDAB in the last 72 hours. Anemia Panel: No results for input(s): VITAMINB12, FOLATE, FERRITIN, TIBC, IRON, RETICCTPCT in the last 72 hours.    Radiology Studies: I have reviewed all of the imaging during this hospital visit personally     Scheduled Meds: . brimonidine  1 drop Both Eyes TID  . busPIRone  15 mg Oral TID  . clopidogrel  75 mg Oral Daily  . divalproex  125 mg Oral BID  . docusate sodium  100 mg Oral BID  . dorzolamide-timolol  1 drop Both Eyes BID  . enoxaparin (LOVENOX) injection  40 mg Subcutaneous Q24H  . gabapentin  100 mg Oral TID  . latanoprost  1 drop Both Eyes QHS  . pantoprazole  40 mg Oral Daily  . sodium chloride flush  3 mL Intravenous Q12H   Continuous Infusions: . lactated ringers Stopped (09/07/19 1311)     LOS: 1 day        Alyss Granato Gerome Apley,  MD   

## 2019-09-09 ENCOUNTER — Encounter: Payer: Self-pay | Admitting: Internal Medicine

## 2019-09-09 DIAGNOSIS — S41101A Unspecified open wound of right upper arm, initial encounter: Secondary | ICD-10-CM | POA: Diagnosis not present

## 2019-09-09 DIAGNOSIS — G8929 Other chronic pain: Secondary | ICD-10-CM | POA: Diagnosis not present

## 2019-09-09 DIAGNOSIS — S61401A Unspecified open wound of right hand, initial encounter: Secondary | ICD-10-CM | POA: Diagnosis not present

## 2019-09-09 DIAGNOSIS — F411 Generalized anxiety disorder: Secondary | ICD-10-CM | POA: Diagnosis not present

## 2019-09-10 DIAGNOSIS — S61401D Unspecified open wound of right hand, subsequent encounter: Secondary | ICD-10-CM | POA: Diagnosis not present

## 2019-09-10 DIAGNOSIS — G8929 Other chronic pain: Secondary | ICD-10-CM | POA: Diagnosis not present

## 2019-09-10 DIAGNOSIS — S41101D Unspecified open wound of right upper arm, subsequent encounter: Secondary | ICD-10-CM | POA: Diagnosis not present

## 2019-09-11 DIAGNOSIS — R5381 Other malaise: Secondary | ICD-10-CM | POA: Diagnosis not present

## 2019-09-11 DIAGNOSIS — I739 Peripheral vascular disease, unspecified: Secondary | ICD-10-CM | POA: Diagnosis not present

## 2019-09-11 DIAGNOSIS — R262 Difficulty in walking, not elsewhere classified: Secondary | ICD-10-CM | POA: Diagnosis not present

## 2019-09-11 DIAGNOSIS — I10 Essential (primary) hypertension: Secondary | ICD-10-CM | POA: Diagnosis not present

## 2019-09-11 DIAGNOSIS — L98499 Non-pressure chronic ulcer of skin of other sites with unspecified severity: Secondary | ICD-10-CM | POA: Diagnosis not present

## 2019-09-11 DIAGNOSIS — M25562 Pain in left knee: Secondary | ICD-10-CM | POA: Diagnosis not present

## 2019-09-19 DIAGNOSIS — L98499 Non-pressure chronic ulcer of skin of other sites with unspecified severity: Secondary | ICD-10-CM | POA: Diagnosis not present

## 2019-09-24 ENCOUNTER — Telehealth: Payer: Self-pay

## 2019-09-24 ENCOUNTER — Encounter: Payer: Self-pay | Admitting: Neurology

## 2019-09-24 ENCOUNTER — Ambulatory Visit: Payer: PPO | Admitting: Neurology

## 2019-09-24 NOTE — Telephone Encounter (Signed)
Pt did not show for their appt with Dr. Athar today.  

## 2019-09-25 DIAGNOSIS — M6281 Muscle weakness (generalized): Secondary | ICD-10-CM | POA: Diagnosis not present

## 2019-09-25 DIAGNOSIS — R251 Tremor, unspecified: Secondary | ICD-10-CM | POA: Diagnosis not present

## 2019-09-25 DIAGNOSIS — L98499 Non-pressure chronic ulcer of skin of other sites with unspecified severity: Secondary | ICD-10-CM | POA: Diagnosis not present

## 2019-09-25 DIAGNOSIS — I739 Peripheral vascular disease, unspecified: Secondary | ICD-10-CM | POA: Diagnosis not present

## 2019-09-25 DIAGNOSIS — N1831 Chronic kidney disease, stage 3a: Secondary | ICD-10-CM | POA: Diagnosis not present

## 2019-09-29 DIAGNOSIS — I739 Peripheral vascular disease, unspecified: Secondary | ICD-10-CM | POA: Diagnosis not present

## 2019-09-29 DIAGNOSIS — Z9181 History of falling: Secondary | ICD-10-CM | POA: Diagnosis not present

## 2019-09-29 DIAGNOSIS — N1831 Chronic kidney disease, stage 3a: Secondary | ICD-10-CM | POA: Diagnosis not present

## 2019-09-29 DIAGNOSIS — M810 Age-related osteoporosis without current pathological fracture: Secondary | ICD-10-CM | POA: Diagnosis not present

## 2019-09-29 DIAGNOSIS — S41101D Unspecified open wound of right upper arm, subsequent encounter: Secondary | ICD-10-CM | POA: Diagnosis not present

## 2019-09-29 DIAGNOSIS — M5416 Radiculopathy, lumbar region: Secondary | ICD-10-CM | POA: Diagnosis not present

## 2019-09-29 DIAGNOSIS — F411 Generalized anxiety disorder: Secondary | ICD-10-CM | POA: Diagnosis not present

## 2019-09-29 DIAGNOSIS — M1712 Unilateral primary osteoarthritis, left knee: Secondary | ICD-10-CM | POA: Diagnosis not present

## 2019-09-29 DIAGNOSIS — G8929 Other chronic pain: Secondary | ICD-10-CM | POA: Diagnosis not present

## 2019-09-29 DIAGNOSIS — I129 Hypertensive chronic kidney disease with stage 1 through stage 4 chronic kidney disease, or unspecified chronic kidney disease: Secondary | ICD-10-CM | POA: Diagnosis not present

## 2019-09-29 DIAGNOSIS — M160 Bilateral primary osteoarthritis of hip: Secondary | ICD-10-CM | POA: Diagnosis not present

## 2019-09-29 DIAGNOSIS — Q600 Renal agenesis, unilateral: Secondary | ICD-10-CM | POA: Diagnosis not present

## 2019-09-29 DIAGNOSIS — G4733 Obstructive sleep apnea (adult) (pediatric): Secondary | ICD-10-CM | POA: Diagnosis not present

## 2019-09-29 DIAGNOSIS — M47892 Other spondylosis, cervical region: Secondary | ICD-10-CM | POA: Diagnosis not present

## 2019-09-29 DIAGNOSIS — K219 Gastro-esophageal reflux disease without esophagitis: Secondary | ICD-10-CM | POA: Diagnosis not present

## 2019-09-29 DIAGNOSIS — H409 Unspecified glaucoma: Secondary | ICD-10-CM | POA: Diagnosis not present

## 2019-09-29 DIAGNOSIS — Z7902 Long term (current) use of antithrombotics/antiplatelets: Secondary | ICD-10-CM | POA: Diagnosis not present

## 2019-09-29 DIAGNOSIS — F339 Major depressive disorder, recurrent, unspecified: Secondary | ICD-10-CM | POA: Diagnosis not present

## 2019-09-29 DIAGNOSIS — M6281 Muscle weakness (generalized): Secondary | ICD-10-CM | POA: Diagnosis not present

## 2019-09-29 DIAGNOSIS — C4491 Basal cell carcinoma of skin, unspecified: Secondary | ICD-10-CM | POA: Diagnosis not present

## 2019-09-29 DIAGNOSIS — M353 Polymyalgia rheumatica: Secondary | ICD-10-CM | POA: Diagnosis not present

## 2019-09-29 DIAGNOSIS — E785 Hyperlipidemia, unspecified: Secondary | ICD-10-CM | POA: Diagnosis not present

## 2019-09-29 DIAGNOSIS — G252 Other specified forms of tremor: Secondary | ICD-10-CM | POA: Diagnosis not present

## 2019-10-01 DIAGNOSIS — M810 Age-related osteoporosis without current pathological fracture: Secondary | ICD-10-CM | POA: Diagnosis not present

## 2019-10-01 DIAGNOSIS — Q600 Renal agenesis, unilateral: Secondary | ICD-10-CM | POA: Diagnosis not present

## 2019-10-01 DIAGNOSIS — G4733 Obstructive sleep apnea (adult) (pediatric): Secondary | ICD-10-CM | POA: Diagnosis not present

## 2019-10-01 DIAGNOSIS — G252 Other specified forms of tremor: Secondary | ICD-10-CM | POA: Diagnosis not present

## 2019-10-01 DIAGNOSIS — M6281 Muscle weakness (generalized): Secondary | ICD-10-CM | POA: Diagnosis not present

## 2019-10-01 DIAGNOSIS — M1712 Unilateral primary osteoarthritis, left knee: Secondary | ICD-10-CM | POA: Diagnosis not present

## 2019-10-01 DIAGNOSIS — C4491 Basal cell carcinoma of skin, unspecified: Secondary | ICD-10-CM | POA: Diagnosis not present

## 2019-10-01 DIAGNOSIS — M5416 Radiculopathy, lumbar region: Secondary | ICD-10-CM | POA: Diagnosis not present

## 2019-10-01 DIAGNOSIS — M160 Bilateral primary osteoarthritis of hip: Secondary | ICD-10-CM | POA: Diagnosis not present

## 2019-10-01 DIAGNOSIS — E785 Hyperlipidemia, unspecified: Secondary | ICD-10-CM | POA: Diagnosis not present

## 2019-10-01 DIAGNOSIS — Z7902 Long term (current) use of antithrombotics/antiplatelets: Secondary | ICD-10-CM | POA: Diagnosis not present

## 2019-10-01 DIAGNOSIS — F411 Generalized anxiety disorder: Secondary | ICD-10-CM | POA: Diagnosis not present

## 2019-10-01 DIAGNOSIS — Z9181 History of falling: Secondary | ICD-10-CM | POA: Diagnosis not present

## 2019-10-01 DIAGNOSIS — F339 Major depressive disorder, recurrent, unspecified: Secondary | ICD-10-CM | POA: Diagnosis not present

## 2019-10-01 DIAGNOSIS — H409 Unspecified glaucoma: Secondary | ICD-10-CM | POA: Diagnosis not present

## 2019-10-01 DIAGNOSIS — I129 Hypertensive chronic kidney disease with stage 1 through stage 4 chronic kidney disease, or unspecified chronic kidney disease: Secondary | ICD-10-CM | POA: Diagnosis not present

## 2019-10-01 DIAGNOSIS — N1831 Chronic kidney disease, stage 3a: Secondary | ICD-10-CM | POA: Diagnosis not present

## 2019-10-01 DIAGNOSIS — M47892 Other spondylosis, cervical region: Secondary | ICD-10-CM | POA: Diagnosis not present

## 2019-10-01 DIAGNOSIS — I739 Peripheral vascular disease, unspecified: Secondary | ICD-10-CM | POA: Diagnosis not present

## 2019-10-01 DIAGNOSIS — G8929 Other chronic pain: Secondary | ICD-10-CM | POA: Diagnosis not present

## 2019-10-01 DIAGNOSIS — M353 Polymyalgia rheumatica: Secondary | ICD-10-CM | POA: Diagnosis not present

## 2019-10-01 DIAGNOSIS — K219 Gastro-esophageal reflux disease without esophagitis: Secondary | ICD-10-CM | POA: Diagnosis not present

## 2019-10-01 DIAGNOSIS — S41101D Unspecified open wound of right upper arm, subsequent encounter: Secondary | ICD-10-CM | POA: Diagnosis not present

## 2019-10-02 ENCOUNTER — Other Ambulatory Visit: Payer: Self-pay | Admitting: Internal Medicine

## 2019-10-02 ENCOUNTER — Telehealth: Payer: Self-pay | Admitting: Internal Medicine

## 2019-10-02 DIAGNOSIS — H8110 Benign paroxysmal vertigo, unspecified ear: Secondary | ICD-10-CM

## 2019-10-02 MED ORDER — MECLIZINE HCL 25 MG PO TABS: 25.0000 mg | ORAL_TABLET | Freq: Two times a day (BID) | ORAL | 2 refills | Status: DC | PRN

## 2019-10-02 NOTE — Telephone Encounter (Signed)
Patient daughter calling in regards to patient needing home help. Daughter said she needs help bathing, cleaning, etc.   Daughter number is [phone]

## 2019-10-02 NOTE — Telephone Encounter (Signed)
1) Medication(s) Requested (by name): meclizine (ANTIVERT) 12.5 MG tablet [id number]   2) Pharmacy of Choice:  [id number] - [place], [address] AT [facility] [address]  [place], [place] [address]    Approved medications will be sent to pharmacy, we will reach out to you if there is an issue.  Requests made after 3pm may not be addressed until following business day!

## 2019-10-02 NOTE — Telephone Encounter (Signed)
Order refaxed & sent a message in Epic to [place].

## 2019-10-02 NOTE — Telephone Encounter (Signed)
Received message back from [facility]viously [facility] stating that they did receive Home Health referral, however due to staffing shortages they are unable to take patient on.  I did forward the message to Opal Sidles, to see if there was another agency that could possibly take patient. Will keep you updated.

## 2019-10-02 NOTE — Telephone Encounter (Signed)
Ok thanks so much for update and for forwarding to [place].   Phill Myron, D.O. Primary Care at [facility]  10/02/2019, 4:27 PM

## 2019-10-02 NOTE — Telephone Encounter (Signed)
I put in a home health order for this type of thing at the end of Feb. Can you check on status of the order please?  Thanks!   Phill Myron, D.O. Primary Care at [facility]  10/02/2019, 9:44 AM

## 2019-10-06 DIAGNOSIS — H409 Unspecified glaucoma: Secondary | ICD-10-CM | POA: Diagnosis not present

## 2019-10-06 DIAGNOSIS — Q600 Renal agenesis, unilateral: Secondary | ICD-10-CM | POA: Diagnosis not present

## 2019-10-06 DIAGNOSIS — M5416 Radiculopathy, lumbar region: Secondary | ICD-10-CM | POA: Diagnosis not present

## 2019-10-06 DIAGNOSIS — M160 Bilateral primary osteoarthritis of hip: Secondary | ICD-10-CM | POA: Diagnosis not present

## 2019-10-06 DIAGNOSIS — Z7902 Long term (current) use of antithrombotics/antiplatelets: Secondary | ICD-10-CM | POA: Diagnosis not present

## 2019-10-06 DIAGNOSIS — E785 Hyperlipidemia, unspecified: Secondary | ICD-10-CM | POA: Diagnosis not present

## 2019-10-06 DIAGNOSIS — M1712 Unilateral primary osteoarthritis, left knee: Secondary | ICD-10-CM | POA: Diagnosis not present

## 2019-10-06 DIAGNOSIS — F411 Generalized anxiety disorder: Secondary | ICD-10-CM | POA: Diagnosis not present

## 2019-10-06 DIAGNOSIS — K219 Gastro-esophageal reflux disease without esophagitis: Secondary | ICD-10-CM | POA: Diagnosis not present

## 2019-10-06 DIAGNOSIS — N1831 Chronic kidney disease, stage 3a: Secondary | ICD-10-CM | POA: Diagnosis not present

## 2019-10-06 DIAGNOSIS — C4491 Basal cell carcinoma of skin, unspecified: Secondary | ICD-10-CM | POA: Diagnosis not present

## 2019-10-06 DIAGNOSIS — G8929 Other chronic pain: Secondary | ICD-10-CM | POA: Diagnosis not present

## 2019-10-06 DIAGNOSIS — M6281 Muscle weakness (generalized): Secondary | ICD-10-CM | POA: Diagnosis not present

## 2019-10-06 DIAGNOSIS — S41101D Unspecified open wound of right upper arm, subsequent encounter: Secondary | ICD-10-CM | POA: Diagnosis not present

## 2019-10-06 DIAGNOSIS — I739 Peripheral vascular disease, unspecified: Secondary | ICD-10-CM | POA: Diagnosis not present

## 2019-10-06 DIAGNOSIS — G252 Other specified forms of tremor: Secondary | ICD-10-CM | POA: Diagnosis not present

## 2019-10-06 DIAGNOSIS — M47892 Other spondylosis, cervical region: Secondary | ICD-10-CM | POA: Diagnosis not present

## 2019-10-06 DIAGNOSIS — M810 Age-related osteoporosis without current pathological fracture: Secondary | ICD-10-CM | POA: Diagnosis not present

## 2019-10-06 DIAGNOSIS — I129 Hypertensive chronic kidney disease with stage 1 through stage 4 chronic kidney disease, or unspecified chronic kidney disease: Secondary | ICD-10-CM | POA: Diagnosis not present

## 2019-10-06 DIAGNOSIS — G4733 Obstructive sleep apnea (adult) (pediatric): Secondary | ICD-10-CM | POA: Diagnosis not present

## 2019-10-06 DIAGNOSIS — F339 Major depressive disorder, recurrent, unspecified: Secondary | ICD-10-CM | POA: Diagnosis not present

## 2019-10-06 DIAGNOSIS — Z9181 History of falling: Secondary | ICD-10-CM | POA: Diagnosis not present

## 2019-10-06 DIAGNOSIS — M353 Polymyalgia rheumatica: Secondary | ICD-10-CM | POA: Diagnosis not present

## 2019-10-07 DIAGNOSIS — G8929 Other chronic pain: Secondary | ICD-10-CM | POA: Diagnosis not present

## 2019-10-07 DIAGNOSIS — Q600 Renal agenesis, unilateral: Secondary | ICD-10-CM | POA: Diagnosis not present

## 2019-10-07 DIAGNOSIS — N1831 Chronic kidney disease, stage 3a: Secondary | ICD-10-CM | POA: Diagnosis not present

## 2019-10-07 DIAGNOSIS — G252 Other specified forms of tremor: Secondary | ICD-10-CM | POA: Diagnosis not present

## 2019-10-07 DIAGNOSIS — M810 Age-related osteoporosis without current pathological fracture: Secondary | ICD-10-CM | POA: Diagnosis not present

## 2019-10-07 DIAGNOSIS — F339 Major depressive disorder, recurrent, unspecified: Secondary | ICD-10-CM | POA: Diagnosis not present

## 2019-10-07 DIAGNOSIS — M353 Polymyalgia rheumatica: Secondary | ICD-10-CM | POA: Diagnosis not present

## 2019-10-07 DIAGNOSIS — I129 Hypertensive chronic kidney disease with stage 1 through stage 4 chronic kidney disease, or unspecified chronic kidney disease: Secondary | ICD-10-CM | POA: Diagnosis not present

## 2019-10-07 DIAGNOSIS — M47892 Other spondylosis, cervical region: Secondary | ICD-10-CM | POA: Diagnosis not present

## 2019-10-07 DIAGNOSIS — M5416 Radiculopathy, lumbar region: Secondary | ICD-10-CM | POA: Diagnosis not present

## 2019-10-07 DIAGNOSIS — M6281 Muscle weakness (generalized): Secondary | ICD-10-CM | POA: Diagnosis not present

## 2019-10-07 DIAGNOSIS — E785 Hyperlipidemia, unspecified: Secondary | ICD-10-CM | POA: Diagnosis not present

## 2019-10-07 DIAGNOSIS — G4733 Obstructive sleep apnea (adult) (pediatric): Secondary | ICD-10-CM | POA: Diagnosis not present

## 2019-10-07 DIAGNOSIS — H409 Unspecified glaucoma: Secondary | ICD-10-CM | POA: Diagnosis not present

## 2019-10-07 DIAGNOSIS — F411 Generalized anxiety disorder: Secondary | ICD-10-CM | POA: Diagnosis not present

## 2019-10-07 DIAGNOSIS — Z7902 Long term (current) use of antithrombotics/antiplatelets: Secondary | ICD-10-CM | POA: Diagnosis not present

## 2019-10-07 DIAGNOSIS — M1712 Unilateral primary osteoarthritis, left knee: Secondary | ICD-10-CM | POA: Diagnosis not present

## 2019-10-07 DIAGNOSIS — Z9181 History of falling: Secondary | ICD-10-CM | POA: Diagnosis not present

## 2019-10-07 DIAGNOSIS — C4491 Basal cell carcinoma of skin, unspecified: Secondary | ICD-10-CM | POA: Diagnosis not present

## 2019-10-07 DIAGNOSIS — K219 Gastro-esophageal reflux disease without esophagitis: Secondary | ICD-10-CM | POA: Diagnosis not present

## 2019-10-07 DIAGNOSIS — M160 Bilateral primary osteoarthritis of hip: Secondary | ICD-10-CM | POA: Diagnosis not present

## 2019-10-07 DIAGNOSIS — I739 Peripheral vascular disease, unspecified: Secondary | ICD-10-CM | POA: Diagnosis not present

## 2019-10-07 DIAGNOSIS — S41101D Unspecified open wound of right upper arm, subsequent encounter: Secondary | ICD-10-CM | POA: Diagnosis not present

## 2019-10-09 DIAGNOSIS — S41101D Unspecified open wound of right upper arm, subsequent encounter: Secondary | ICD-10-CM | POA: Diagnosis not present

## 2019-10-09 DIAGNOSIS — N1831 Chronic kidney disease, stage 3a: Secondary | ICD-10-CM | POA: Diagnosis not present

## 2019-10-09 DIAGNOSIS — F411 Generalized anxiety disorder: Secondary | ICD-10-CM | POA: Diagnosis not present

## 2019-10-09 DIAGNOSIS — Z9181 History of falling: Secondary | ICD-10-CM | POA: Diagnosis not present

## 2019-10-09 DIAGNOSIS — Q600 Renal agenesis, unilateral: Secondary | ICD-10-CM | POA: Diagnosis not present

## 2019-10-09 DIAGNOSIS — K219 Gastro-esophageal reflux disease without esophagitis: Secondary | ICD-10-CM | POA: Diagnosis not present

## 2019-10-09 DIAGNOSIS — E785 Hyperlipidemia, unspecified: Secondary | ICD-10-CM | POA: Diagnosis not present

## 2019-10-09 DIAGNOSIS — M5416 Radiculopathy, lumbar region: Secondary | ICD-10-CM | POA: Diagnosis not present

## 2019-10-09 DIAGNOSIS — M353 Polymyalgia rheumatica: Secondary | ICD-10-CM | POA: Diagnosis not present

## 2019-10-09 DIAGNOSIS — M160 Bilateral primary osteoarthritis of hip: Secondary | ICD-10-CM | POA: Diagnosis not present

## 2019-10-09 DIAGNOSIS — H409 Unspecified glaucoma: Secondary | ICD-10-CM | POA: Diagnosis not present

## 2019-10-09 DIAGNOSIS — G4733 Obstructive sleep apnea (adult) (pediatric): Secondary | ICD-10-CM | POA: Diagnosis not present

## 2019-10-09 DIAGNOSIS — M1712 Unilateral primary osteoarthritis, left knee: Secondary | ICD-10-CM | POA: Diagnosis not present

## 2019-10-09 DIAGNOSIS — M47892 Other spondylosis, cervical region: Secondary | ICD-10-CM | POA: Diagnosis not present

## 2019-10-09 DIAGNOSIS — G8929 Other chronic pain: Secondary | ICD-10-CM | POA: Diagnosis not present

## 2019-10-09 DIAGNOSIS — C4491 Basal cell carcinoma of skin, unspecified: Secondary | ICD-10-CM | POA: Diagnosis not present

## 2019-10-09 DIAGNOSIS — G252 Other specified forms of tremor: Secondary | ICD-10-CM | POA: Diagnosis not present

## 2019-10-09 DIAGNOSIS — M6281 Muscle weakness (generalized): Secondary | ICD-10-CM | POA: Diagnosis not present

## 2019-10-09 DIAGNOSIS — I129 Hypertensive chronic kidney disease with stage 1 through stage 4 chronic kidney disease, or unspecified chronic kidney disease: Secondary | ICD-10-CM | POA: Diagnosis not present

## 2019-10-09 DIAGNOSIS — I739 Peripheral vascular disease, unspecified: Secondary | ICD-10-CM | POA: Diagnosis not present

## 2019-10-09 DIAGNOSIS — Z7902 Long term (current) use of antithrombotics/antiplatelets: Secondary | ICD-10-CM | POA: Diagnosis not present

## 2019-10-09 DIAGNOSIS — F339 Major depressive disorder, recurrent, unspecified: Secondary | ICD-10-CM | POA: Diagnosis not present

## 2019-10-09 DIAGNOSIS — M810 Age-related osteoporosis without current pathological fracture: Secondary | ICD-10-CM | POA: Diagnosis not present

## 2019-10-13 DIAGNOSIS — M353 Polymyalgia rheumatica: Secondary | ICD-10-CM | POA: Diagnosis not present

## 2019-10-13 DIAGNOSIS — M160 Bilateral primary osteoarthritis of hip: Secondary | ICD-10-CM | POA: Diagnosis not present

## 2019-10-13 DIAGNOSIS — F411 Generalized anxiety disorder: Secondary | ICD-10-CM | POA: Diagnosis not present

## 2019-10-13 DIAGNOSIS — M5416 Radiculopathy, lumbar region: Secondary | ICD-10-CM | POA: Diagnosis not present

## 2019-10-13 DIAGNOSIS — H409 Unspecified glaucoma: Secondary | ICD-10-CM | POA: Diagnosis not present

## 2019-10-13 DIAGNOSIS — Q600 Renal agenesis, unilateral: Secondary | ICD-10-CM | POA: Diagnosis not present

## 2019-10-13 DIAGNOSIS — E785 Hyperlipidemia, unspecified: Secondary | ICD-10-CM | POA: Diagnosis not present

## 2019-10-13 DIAGNOSIS — K219 Gastro-esophageal reflux disease without esophagitis: Secondary | ICD-10-CM | POA: Diagnosis not present

## 2019-10-13 DIAGNOSIS — I129 Hypertensive chronic kidney disease with stage 1 through stage 4 chronic kidney disease, or unspecified chronic kidney disease: Secondary | ICD-10-CM | POA: Diagnosis not present

## 2019-10-13 DIAGNOSIS — I739 Peripheral vascular disease, unspecified: Secondary | ICD-10-CM | POA: Diagnosis not present

## 2019-10-13 DIAGNOSIS — C4491 Basal cell carcinoma of skin, unspecified: Secondary | ICD-10-CM | POA: Diagnosis not present

## 2019-10-13 DIAGNOSIS — M6281 Muscle weakness (generalized): Secondary | ICD-10-CM | POA: Diagnosis not present

## 2019-10-13 DIAGNOSIS — G4733 Obstructive sleep apnea (adult) (pediatric): Secondary | ICD-10-CM | POA: Diagnosis not present

## 2019-10-13 DIAGNOSIS — N1831 Chronic kidney disease, stage 3a: Secondary | ICD-10-CM | POA: Diagnosis not present

## 2019-10-13 DIAGNOSIS — M47892 Other spondylosis, cervical region: Secondary | ICD-10-CM | POA: Diagnosis not present

## 2019-10-13 DIAGNOSIS — M810 Age-related osteoporosis without current pathological fracture: Secondary | ICD-10-CM | POA: Diagnosis not present

## 2019-10-13 DIAGNOSIS — S41101D Unspecified open wound of right upper arm, subsequent encounter: Secondary | ICD-10-CM | POA: Diagnosis not present

## 2019-10-13 DIAGNOSIS — Z9181 History of falling: Secondary | ICD-10-CM | POA: Diagnosis not present

## 2019-10-13 DIAGNOSIS — Z7902 Long term (current) use of antithrombotics/antiplatelets: Secondary | ICD-10-CM | POA: Diagnosis not present

## 2019-10-13 DIAGNOSIS — M1712 Unilateral primary osteoarthritis, left knee: Secondary | ICD-10-CM | POA: Diagnosis not present

## 2019-10-13 DIAGNOSIS — F339 Major depressive disorder, recurrent, unspecified: Secondary | ICD-10-CM | POA: Diagnosis not present

## 2019-10-13 DIAGNOSIS — G8929 Other chronic pain: Secondary | ICD-10-CM | POA: Diagnosis not present

## 2019-10-13 DIAGNOSIS — G252 Other specified forms of tremor: Secondary | ICD-10-CM | POA: Diagnosis not present

## 2019-10-14 DIAGNOSIS — K219 Gastro-esophageal reflux disease without esophagitis: Secondary | ICD-10-CM | POA: Diagnosis not present

## 2019-10-14 DIAGNOSIS — E785 Hyperlipidemia, unspecified: Secondary | ICD-10-CM | POA: Diagnosis not present

## 2019-10-14 DIAGNOSIS — G252 Other specified forms of tremor: Secondary | ICD-10-CM | POA: Diagnosis not present

## 2019-10-14 DIAGNOSIS — F411 Generalized anxiety disorder: Secondary | ICD-10-CM | POA: Diagnosis not present

## 2019-10-14 DIAGNOSIS — H409 Unspecified glaucoma: Secondary | ICD-10-CM | POA: Diagnosis not present

## 2019-10-14 DIAGNOSIS — G8929 Other chronic pain: Secondary | ICD-10-CM | POA: Diagnosis not present

## 2019-10-14 DIAGNOSIS — M6281 Muscle weakness (generalized): Secondary | ICD-10-CM | POA: Diagnosis not present

## 2019-10-14 DIAGNOSIS — C4491 Basal cell carcinoma of skin, unspecified: Secondary | ICD-10-CM | POA: Diagnosis not present

## 2019-10-14 DIAGNOSIS — M47892 Other spondylosis, cervical region: Secondary | ICD-10-CM | POA: Diagnosis not present

## 2019-10-14 DIAGNOSIS — M810 Age-related osteoporosis without current pathological fracture: Secondary | ICD-10-CM | POA: Diagnosis not present

## 2019-10-14 DIAGNOSIS — Z7902 Long term (current) use of antithrombotics/antiplatelets: Secondary | ICD-10-CM | POA: Diagnosis not present

## 2019-10-14 DIAGNOSIS — Z9181 History of falling: Secondary | ICD-10-CM | POA: Diagnosis not present

## 2019-10-14 DIAGNOSIS — M160 Bilateral primary osteoarthritis of hip: Secondary | ICD-10-CM | POA: Diagnosis not present

## 2019-10-14 DIAGNOSIS — M5416 Radiculopathy, lumbar region: Secondary | ICD-10-CM | POA: Diagnosis not present

## 2019-10-14 DIAGNOSIS — G4733 Obstructive sleep apnea (adult) (pediatric): Secondary | ICD-10-CM | POA: Diagnosis not present

## 2019-10-14 DIAGNOSIS — N1831 Chronic kidney disease, stage 3a: Secondary | ICD-10-CM | POA: Diagnosis not present

## 2019-10-14 DIAGNOSIS — I129 Hypertensive chronic kidney disease with stage 1 through stage 4 chronic kidney disease, or unspecified chronic kidney disease: Secondary | ICD-10-CM | POA: Diagnosis not present

## 2019-10-14 DIAGNOSIS — S41101D Unspecified open wound of right upper arm, subsequent encounter: Secondary | ICD-10-CM | POA: Diagnosis not present

## 2019-10-14 DIAGNOSIS — M353 Polymyalgia rheumatica: Secondary | ICD-10-CM | POA: Diagnosis not present

## 2019-10-14 DIAGNOSIS — Q600 Renal agenesis, unilateral: Secondary | ICD-10-CM | POA: Diagnosis not present

## 2019-10-14 DIAGNOSIS — F339 Major depressive disorder, recurrent, unspecified: Secondary | ICD-10-CM | POA: Diagnosis not present

## 2019-10-14 DIAGNOSIS — I739 Peripheral vascular disease, unspecified: Secondary | ICD-10-CM | POA: Diagnosis not present

## 2019-10-14 DIAGNOSIS — M1712 Unilateral primary osteoarthritis, left knee: Secondary | ICD-10-CM | POA: Diagnosis not present

## 2019-10-15 ENCOUNTER — Other Ambulatory Visit: Payer: Self-pay

## 2019-10-15 DIAGNOSIS — I10 Essential (primary) hypertension: Secondary | ICD-10-CM

## 2019-10-15 MED ORDER — AMLODIPINE BESYLATE 5 MG PO TABS: ORAL_TABLET | ORAL | 0 refills | Status: DC

## 2019-10-16 DIAGNOSIS — M47892 Other spondylosis, cervical region: Secondary | ICD-10-CM | POA: Diagnosis not present

## 2019-10-16 DIAGNOSIS — M353 Polymyalgia rheumatica: Secondary | ICD-10-CM | POA: Diagnosis not present

## 2019-10-16 DIAGNOSIS — M5416 Radiculopathy, lumbar region: Secondary | ICD-10-CM | POA: Diagnosis not present

## 2019-10-16 DIAGNOSIS — M160 Bilateral primary osteoarthritis of hip: Secondary | ICD-10-CM | POA: Diagnosis not present

## 2019-10-16 DIAGNOSIS — M1712 Unilateral primary osteoarthritis, left knee: Secondary | ICD-10-CM | POA: Diagnosis not present

## 2019-10-16 DIAGNOSIS — Z7902 Long term (current) use of antithrombotics/antiplatelets: Secondary | ICD-10-CM | POA: Diagnosis not present

## 2019-10-16 DIAGNOSIS — Q600 Renal agenesis, unilateral: Secondary | ICD-10-CM | POA: Diagnosis not present

## 2019-10-16 DIAGNOSIS — I739 Peripheral vascular disease, unspecified: Secondary | ICD-10-CM | POA: Diagnosis not present

## 2019-10-16 DIAGNOSIS — F411 Generalized anxiety disorder: Secondary | ICD-10-CM | POA: Diagnosis not present

## 2019-10-16 DIAGNOSIS — M810 Age-related osteoporosis without current pathological fracture: Secondary | ICD-10-CM | POA: Diagnosis not present

## 2019-10-16 DIAGNOSIS — G4733 Obstructive sleep apnea (adult) (pediatric): Secondary | ICD-10-CM | POA: Diagnosis not present

## 2019-10-16 DIAGNOSIS — H409 Unspecified glaucoma: Secondary | ICD-10-CM | POA: Diagnosis not present

## 2019-10-16 DIAGNOSIS — M6281 Muscle weakness (generalized): Secondary | ICD-10-CM | POA: Diagnosis not present

## 2019-10-16 DIAGNOSIS — C4491 Basal cell carcinoma of skin, unspecified: Secondary | ICD-10-CM | POA: Diagnosis not present

## 2019-10-16 DIAGNOSIS — G8929 Other chronic pain: Secondary | ICD-10-CM | POA: Diagnosis not present

## 2019-10-16 DIAGNOSIS — Z9181 History of falling: Secondary | ICD-10-CM | POA: Diagnosis not present

## 2019-10-16 DIAGNOSIS — E785 Hyperlipidemia, unspecified: Secondary | ICD-10-CM | POA: Diagnosis not present

## 2019-10-16 DIAGNOSIS — N1831 Chronic kidney disease, stage 3a: Secondary | ICD-10-CM | POA: Diagnosis not present

## 2019-10-16 DIAGNOSIS — G252 Other specified forms of tremor: Secondary | ICD-10-CM | POA: Diagnosis not present

## 2019-10-16 DIAGNOSIS — S41101D Unspecified open wound of right upper arm, subsequent encounter: Secondary | ICD-10-CM | POA: Diagnosis not present

## 2019-10-16 DIAGNOSIS — I129 Hypertensive chronic kidney disease with stage 1 through stage 4 chronic kidney disease, or unspecified chronic kidney disease: Secondary | ICD-10-CM | POA: Diagnosis not present

## 2019-10-16 DIAGNOSIS — K219 Gastro-esophageal reflux disease without esophagitis: Secondary | ICD-10-CM | POA: Diagnosis not present

## 2019-10-16 DIAGNOSIS — F339 Major depressive disorder, recurrent, unspecified: Secondary | ICD-10-CM | POA: Diagnosis not present

## 2019-10-20 DIAGNOSIS — M353 Polymyalgia rheumatica: Secondary | ICD-10-CM | POA: Diagnosis not present

## 2019-10-20 DIAGNOSIS — N1831 Chronic kidney disease, stage 3a: Secondary | ICD-10-CM | POA: Diagnosis not present

## 2019-10-20 DIAGNOSIS — Q600 Renal agenesis, unilateral: Secondary | ICD-10-CM | POA: Diagnosis not present

## 2019-10-20 DIAGNOSIS — S41101D Unspecified open wound of right upper arm, subsequent encounter: Secondary | ICD-10-CM | POA: Diagnosis not present

## 2019-10-20 DIAGNOSIS — I129 Hypertensive chronic kidney disease with stage 1 through stage 4 chronic kidney disease, or unspecified chronic kidney disease: Secondary | ICD-10-CM | POA: Diagnosis not present

## 2019-10-20 DIAGNOSIS — K219 Gastro-esophageal reflux disease without esophagitis: Secondary | ICD-10-CM | POA: Diagnosis not present

## 2019-10-20 DIAGNOSIS — F339 Major depressive disorder, recurrent, unspecified: Secondary | ICD-10-CM | POA: Diagnosis not present

## 2019-10-20 DIAGNOSIS — Z9181 History of falling: Secondary | ICD-10-CM | POA: Diagnosis not present

## 2019-10-20 DIAGNOSIS — E785 Hyperlipidemia, unspecified: Secondary | ICD-10-CM | POA: Diagnosis not present

## 2019-10-20 DIAGNOSIS — M5416 Radiculopathy, lumbar region: Secondary | ICD-10-CM | POA: Diagnosis not present

## 2019-10-20 DIAGNOSIS — G252 Other specified forms of tremor: Secondary | ICD-10-CM | POA: Diagnosis not present

## 2019-10-20 DIAGNOSIS — I739 Peripheral vascular disease, unspecified: Secondary | ICD-10-CM | POA: Diagnosis not present

## 2019-10-20 DIAGNOSIS — G4733 Obstructive sleep apnea (adult) (pediatric): Secondary | ICD-10-CM | POA: Diagnosis not present

## 2019-10-20 DIAGNOSIS — F411 Generalized anxiety disorder: Secondary | ICD-10-CM | POA: Diagnosis not present

## 2019-10-20 DIAGNOSIS — Z7902 Long term (current) use of antithrombotics/antiplatelets: Secondary | ICD-10-CM | POA: Diagnosis not present

## 2019-10-20 DIAGNOSIS — G8929 Other chronic pain: Secondary | ICD-10-CM | POA: Diagnosis not present

## 2019-10-20 DIAGNOSIS — M160 Bilateral primary osteoarthritis of hip: Secondary | ICD-10-CM | POA: Diagnosis not present

## 2019-10-20 DIAGNOSIS — C4491 Basal cell carcinoma of skin, unspecified: Secondary | ICD-10-CM | POA: Diagnosis not present

## 2019-10-20 DIAGNOSIS — M6281 Muscle weakness (generalized): Secondary | ICD-10-CM | POA: Diagnosis not present

## 2019-10-20 DIAGNOSIS — M810 Age-related osteoporosis without current pathological fracture: Secondary | ICD-10-CM | POA: Diagnosis not present

## 2019-10-20 DIAGNOSIS — M1712 Unilateral primary osteoarthritis, left knee: Secondary | ICD-10-CM | POA: Diagnosis not present

## 2019-10-20 DIAGNOSIS — M47892 Other spondylosis, cervical region: Secondary | ICD-10-CM | POA: Diagnosis not present

## 2019-10-20 DIAGNOSIS — H409 Unspecified glaucoma: Secondary | ICD-10-CM | POA: Diagnosis not present

## 2019-10-21 DIAGNOSIS — F411 Generalized anxiety disorder: Secondary | ICD-10-CM | POA: Diagnosis not present

## 2019-10-21 DIAGNOSIS — K219 Gastro-esophageal reflux disease without esophagitis: Secondary | ICD-10-CM | POA: Diagnosis not present

## 2019-10-21 DIAGNOSIS — M160 Bilateral primary osteoarthritis of hip: Secondary | ICD-10-CM | POA: Diagnosis not present

## 2019-10-21 DIAGNOSIS — Q600 Renal agenesis, unilateral: Secondary | ICD-10-CM | POA: Diagnosis not present

## 2019-10-21 DIAGNOSIS — F339 Major depressive disorder, recurrent, unspecified: Secondary | ICD-10-CM | POA: Diagnosis not present

## 2019-10-21 DIAGNOSIS — G252 Other specified forms of tremor: Secondary | ICD-10-CM | POA: Diagnosis not present

## 2019-10-21 DIAGNOSIS — M1712 Unilateral primary osteoarthritis, left knee: Secondary | ICD-10-CM | POA: Diagnosis not present

## 2019-10-21 DIAGNOSIS — M6281 Muscle weakness (generalized): Secondary | ICD-10-CM | POA: Diagnosis not present

## 2019-10-21 DIAGNOSIS — I129 Hypertensive chronic kidney disease with stage 1 through stage 4 chronic kidney disease, or unspecified chronic kidney disease: Secondary | ICD-10-CM | POA: Diagnosis not present

## 2019-10-21 DIAGNOSIS — G8929 Other chronic pain: Secondary | ICD-10-CM | POA: Diagnosis not present

## 2019-10-21 DIAGNOSIS — M810 Age-related osteoporosis without current pathological fracture: Secondary | ICD-10-CM | POA: Diagnosis not present

## 2019-10-21 DIAGNOSIS — N1831 Chronic kidney disease, stage 3a: Secondary | ICD-10-CM | POA: Diagnosis not present

## 2019-10-21 DIAGNOSIS — M5416 Radiculopathy, lumbar region: Secondary | ICD-10-CM | POA: Diagnosis not present

## 2019-10-21 DIAGNOSIS — Z9181 History of falling: Secondary | ICD-10-CM | POA: Diagnosis not present

## 2019-10-21 DIAGNOSIS — I739 Peripheral vascular disease, unspecified: Secondary | ICD-10-CM | POA: Diagnosis not present

## 2019-10-21 DIAGNOSIS — G4733 Obstructive sleep apnea (adult) (pediatric): Secondary | ICD-10-CM | POA: Diagnosis not present

## 2019-10-21 DIAGNOSIS — S41101D Unspecified open wound of right upper arm, subsequent encounter: Secondary | ICD-10-CM | POA: Diagnosis not present

## 2019-10-21 DIAGNOSIS — H409 Unspecified glaucoma: Secondary | ICD-10-CM | POA: Diagnosis not present

## 2019-10-21 DIAGNOSIS — M353 Polymyalgia rheumatica: Secondary | ICD-10-CM | POA: Diagnosis not present

## 2019-10-21 DIAGNOSIS — C4491 Basal cell carcinoma of skin, unspecified: Secondary | ICD-10-CM | POA: Diagnosis not present

## 2019-10-21 DIAGNOSIS — M47892 Other spondylosis, cervical region: Secondary | ICD-10-CM | POA: Diagnosis not present

## 2019-10-21 DIAGNOSIS — Z7902 Long term (current) use of antithrombotics/antiplatelets: Secondary | ICD-10-CM | POA: Diagnosis not present

## 2019-10-21 DIAGNOSIS — E785 Hyperlipidemia, unspecified: Secondary | ICD-10-CM | POA: Diagnosis not present

## 2019-10-23 DIAGNOSIS — M160 Bilateral primary osteoarthritis of hip: Secondary | ICD-10-CM | POA: Diagnosis not present

## 2019-10-23 DIAGNOSIS — I739 Peripheral vascular disease, unspecified: Secondary | ICD-10-CM | POA: Diagnosis not present

## 2019-10-23 DIAGNOSIS — S41101D Unspecified open wound of right upper arm, subsequent encounter: Secondary | ICD-10-CM | POA: Diagnosis not present

## 2019-10-23 DIAGNOSIS — M353 Polymyalgia rheumatica: Secondary | ICD-10-CM | POA: Diagnosis not present

## 2019-10-23 DIAGNOSIS — Z9181 History of falling: Secondary | ICD-10-CM | POA: Diagnosis not present

## 2019-10-23 DIAGNOSIS — M1712 Unilateral primary osteoarthritis, left knee: Secondary | ICD-10-CM | POA: Diagnosis not present

## 2019-10-23 DIAGNOSIS — E785 Hyperlipidemia, unspecified: Secondary | ICD-10-CM | POA: Diagnosis not present

## 2019-10-23 DIAGNOSIS — M5416 Radiculopathy, lumbar region: Secondary | ICD-10-CM | POA: Diagnosis not present

## 2019-10-23 DIAGNOSIS — N1831 Chronic kidney disease, stage 3a: Secondary | ICD-10-CM | POA: Diagnosis not present

## 2019-10-23 DIAGNOSIS — I129 Hypertensive chronic kidney disease with stage 1 through stage 4 chronic kidney disease, or unspecified chronic kidney disease: Secondary | ICD-10-CM | POA: Diagnosis not present

## 2019-10-23 DIAGNOSIS — G8929 Other chronic pain: Secondary | ICD-10-CM | POA: Diagnosis not present

## 2019-10-23 DIAGNOSIS — H409 Unspecified glaucoma: Secondary | ICD-10-CM | POA: Diagnosis not present

## 2019-10-23 DIAGNOSIS — Q600 Renal agenesis, unilateral: Secondary | ICD-10-CM | POA: Diagnosis not present

## 2019-10-23 DIAGNOSIS — C4491 Basal cell carcinoma of skin, unspecified: Secondary | ICD-10-CM | POA: Diagnosis not present

## 2019-10-23 DIAGNOSIS — K219 Gastro-esophageal reflux disease without esophagitis: Secondary | ICD-10-CM | POA: Diagnosis not present

## 2019-10-23 DIAGNOSIS — G4733 Obstructive sleep apnea (adult) (pediatric): Secondary | ICD-10-CM | POA: Diagnosis not present

## 2019-10-23 DIAGNOSIS — G252 Other specified forms of tremor: Secondary | ICD-10-CM | POA: Diagnosis not present

## 2019-10-23 DIAGNOSIS — F339 Major depressive disorder, recurrent, unspecified: Secondary | ICD-10-CM | POA: Diagnosis not present

## 2019-10-23 DIAGNOSIS — F411 Generalized anxiety disorder: Secondary | ICD-10-CM | POA: Diagnosis not present

## 2019-10-23 DIAGNOSIS — M6281 Muscle weakness (generalized): Secondary | ICD-10-CM | POA: Diagnosis not present

## 2019-10-23 DIAGNOSIS — M47892 Other spondylosis, cervical region: Secondary | ICD-10-CM | POA: Diagnosis not present

## 2019-10-23 DIAGNOSIS — M810 Age-related osteoporosis without current pathological fracture: Secondary | ICD-10-CM | POA: Diagnosis not present

## 2019-10-23 DIAGNOSIS — Z7902 Long term (current) use of antithrombotics/antiplatelets: Secondary | ICD-10-CM | POA: Diagnosis not present

## 2019-10-27 DIAGNOSIS — K219 Gastro-esophageal reflux disease without esophagitis: Secondary | ICD-10-CM | POA: Diagnosis not present

## 2019-10-27 DIAGNOSIS — Z7902 Long term (current) use of antithrombotics/antiplatelets: Secondary | ICD-10-CM | POA: Diagnosis not present

## 2019-10-27 DIAGNOSIS — N1831 Chronic kidney disease, stage 3a: Secondary | ICD-10-CM | POA: Diagnosis not present

## 2019-10-27 DIAGNOSIS — G4733 Obstructive sleep apnea (adult) (pediatric): Secondary | ICD-10-CM | POA: Diagnosis not present

## 2019-10-27 DIAGNOSIS — S41101D Unspecified open wound of right upper arm, subsequent encounter: Secondary | ICD-10-CM | POA: Diagnosis not present

## 2019-10-27 DIAGNOSIS — E785 Hyperlipidemia, unspecified: Secondary | ICD-10-CM | POA: Diagnosis not present

## 2019-10-27 DIAGNOSIS — F411 Generalized anxiety disorder: Secondary | ICD-10-CM | POA: Diagnosis not present

## 2019-10-27 DIAGNOSIS — M6281 Muscle weakness (generalized): Secondary | ICD-10-CM | POA: Diagnosis not present

## 2019-10-27 DIAGNOSIS — I129 Hypertensive chronic kidney disease with stage 1 through stage 4 chronic kidney disease, or unspecified chronic kidney disease: Secondary | ICD-10-CM | POA: Diagnosis not present

## 2019-10-27 DIAGNOSIS — C4491 Basal cell carcinoma of skin, unspecified: Secondary | ICD-10-CM | POA: Diagnosis not present

## 2019-10-27 DIAGNOSIS — M5416 Radiculopathy, lumbar region: Secondary | ICD-10-CM | POA: Diagnosis not present

## 2019-10-27 DIAGNOSIS — H409 Unspecified glaucoma: Secondary | ICD-10-CM | POA: Diagnosis not present

## 2019-10-27 DIAGNOSIS — I739 Peripheral vascular disease, unspecified: Secondary | ICD-10-CM | POA: Diagnosis not present

## 2019-10-27 DIAGNOSIS — G8929 Other chronic pain: Secondary | ICD-10-CM | POA: Diagnosis not present

## 2019-10-27 DIAGNOSIS — Z9181 History of falling: Secondary | ICD-10-CM | POA: Diagnosis not present

## 2019-10-27 DIAGNOSIS — G252 Other specified forms of tremor: Secondary | ICD-10-CM | POA: Diagnosis not present

## 2019-10-27 DIAGNOSIS — M1712 Unilateral primary osteoarthritis, left knee: Secondary | ICD-10-CM | POA: Diagnosis not present

## 2019-10-27 DIAGNOSIS — M810 Age-related osteoporosis without current pathological fracture: Secondary | ICD-10-CM | POA: Diagnosis not present

## 2019-10-27 DIAGNOSIS — M353 Polymyalgia rheumatica: Secondary | ICD-10-CM | POA: Diagnosis not present

## 2019-10-27 DIAGNOSIS — Q600 Renal agenesis, unilateral: Secondary | ICD-10-CM | POA: Diagnosis not present

## 2019-10-27 DIAGNOSIS — M47892 Other spondylosis, cervical region: Secondary | ICD-10-CM | POA: Diagnosis not present

## 2019-10-27 DIAGNOSIS — M160 Bilateral primary osteoarthritis of hip: Secondary | ICD-10-CM | POA: Diagnosis not present

## 2019-10-27 DIAGNOSIS — F339 Major depressive disorder, recurrent, unspecified: Secondary | ICD-10-CM | POA: Diagnosis not present

## 2019-11-04 ENCOUNTER — Telehealth: Payer: Self-pay | Admitting: Internal Medicine

## 2019-11-04 NOTE — Telephone Encounter (Signed)
1) Medication(s) Requested (by name):traMADol (ULTRAM) 50 MG tablet [id number]   2) [facility] [place], [place]   3) Special Requests:   Approved medications will be sent to the pharmacy, we will reach out if there is an issue.  Requests made after 3pm may not be addressed until the following business day!  If a patient is unsure of the name of the medication(s) please note and ask patient to call back when they are able to provide all info, do not send to responsible party until all information is available!

## 2019-11-21 DIAGNOSIS — M5137 Other intervertebral disc degeneration, lumbosacral region: Secondary | ICD-10-CM | POA: Diagnosis not present

## 2019-11-21 DIAGNOSIS — R42 Dizziness and giddiness: Secondary | ICD-10-CM | POA: Diagnosis not present

## 2019-11-21 DIAGNOSIS — Z79899 Other long term (current) drug therapy: Secondary | ICD-10-CM | POA: Diagnosis not present

## 2019-11-21 DIAGNOSIS — M5430 Sciatica, unspecified side: Secondary | ICD-10-CM | POA: Diagnosis not present

## 2019-11-21 DIAGNOSIS — M545 Low back pain: Secondary | ICD-10-CM | POA: Diagnosis not present

## 2019-11-27 ENCOUNTER — Other Ambulatory Visit: Payer: Self-pay

## 2019-11-27 MED ORDER — BUSPIRONE HCL 15 MG PO TABS: 15.0000 mg | ORAL_TABLET | Freq: Three times a day (TID) | ORAL | 2 refills | Status: DC

## 2019-12-19 DIAGNOSIS — M545 Low back pain: Secondary | ICD-10-CM | POA: Diagnosis not present

## 2019-12-19 DIAGNOSIS — M5430 Sciatica, unspecified side: Secondary | ICD-10-CM | POA: Diagnosis not present

## 2019-12-19 DIAGNOSIS — Z79899 Other long term (current) drug therapy: Secondary | ICD-10-CM | POA: Diagnosis not present

## 2019-12-19 DIAGNOSIS — R42 Dizziness and giddiness: Secondary | ICD-10-CM | POA: Diagnosis not present

## 2020-01-01 ENCOUNTER — Other Ambulatory Visit: Payer: Self-pay

## 2020-01-01 DIAGNOSIS — Z13220 Encounter for screening for lipoid disorders: Secondary | ICD-10-CM | POA: Diagnosis not present

## 2020-01-01 DIAGNOSIS — Z1331 Encounter for screening for depression: Secondary | ICD-10-CM | POA: Diagnosis not present

## 2020-01-01 DIAGNOSIS — R0602 Shortness of breath: Secondary | ICD-10-CM | POA: Diagnosis not present

## 2020-01-01 DIAGNOSIS — Z9181 History of falling: Secondary | ICD-10-CM | POA: Diagnosis not present

## 2020-01-01 DIAGNOSIS — I10 Essential (primary) hypertension: Secondary | ICD-10-CM | POA: Diagnosis not present

## 2020-01-01 DIAGNOSIS — Z114 Encounter for screening for human immunodeficiency virus [HIV]: Secondary | ICD-10-CM | POA: Diagnosis not present

## 2020-01-01 DIAGNOSIS — Z0000 Encounter for general adult medical examination without abnormal findings: Secondary | ICD-10-CM | POA: Diagnosis not present

## 2020-01-01 DIAGNOSIS — R5383 Other fatigue: Secondary | ICD-10-CM | POA: Diagnosis not present

## 2020-01-01 DIAGNOSIS — E559 Vitamin D deficiency, unspecified: Secondary | ICD-10-CM | POA: Diagnosis not present

## 2020-01-01 DIAGNOSIS — Z131 Encounter for screening for diabetes mellitus: Secondary | ICD-10-CM | POA: Diagnosis not present

## 2020-01-01 DIAGNOSIS — Z1339 Encounter for screening examination for other mental health and behavioral disorders: Secondary | ICD-10-CM | POA: Diagnosis not present

## 2020-01-01 MED ORDER — GABAPENTIN 100 MG PO CAPS: 100.0000 mg | ORAL_CAPSULE | Freq: Three times a day (TID) | ORAL | 1 refills | Status: DC

## 2020-01-16 DIAGNOSIS — I10 Essential (primary) hypertension: Secondary | ICD-10-CM | POA: Diagnosis not present

## 2020-01-16 DIAGNOSIS — Z79899 Other long term (current) drug therapy: Secondary | ICD-10-CM | POA: Diagnosis not present

## 2020-01-16 DIAGNOSIS — M545 Low back pain: Secondary | ICD-10-CM | POA: Diagnosis not present

## 2020-01-16 DIAGNOSIS — R42 Dizziness and giddiness: Secondary | ICD-10-CM | POA: Diagnosis not present

## 2020-02-06 ENCOUNTER — Ambulatory Visit (HOSPITAL_COMMUNITY): Payer: PPO

## 2020-02-18 ENCOUNTER — Ambulatory Visit
Admission: EM | Admit: 2020-02-18 | Discharge: 2020-02-18 | Disposition: A | Discharge status: Home / Self Care | Payer: PPO

## 2020-02-18 DIAGNOSIS — R05 Cough: Secondary | ICD-10-CM

## 2020-02-18 DIAGNOSIS — R059 Cough, unspecified: Secondary | ICD-10-CM

## 2020-02-18 DIAGNOSIS — R112 Nausea with vomiting, unspecified: Secondary | ICD-10-CM

## 2020-02-18 LAB — POCT URINALYSIS DIP (MANUAL ENTRY)
Bilirubin, UA: NEGATIVE
Blood, UA: NEGATIVE
Glucose, UA: NEGATIVE mg/dL
Ketones, POC UA: NEGATIVE mg/dL
Nitrite, UA: NEGATIVE
Protein Ur, POC: NEGATIVE mg/dL
Spec Grav, UA: 1.015 (ref 1.010–1.025)
Urobilinogen, UA: 0.2 E.U./dL
pH, UA: 5.5 (ref 5.0–8.0)

## 2020-02-18 NOTE — ED Triage Notes (Signed)
Pt c/o cough, SOB, n/v last night with "throwing up 15 times last night", and chills. Pt reports she started a new Rx of amitriptyline and pt's attributes coughing, n/v to Rx.  Denies abdominal pain, fever, flank pain, cough, runny nose, dysuria sx. No tylenol/ibuprofen or other OTC for symptoms.

## 2020-02-18 NOTE — ED Provider Notes (Signed)
[facility]    CSN: [id number] Arrival date & time: 02/18/20  3244      History   Chief Complaint Chief Complaint  Patient presents with  . Emesis    HPI [name] [name] is a 79 y.o. female.   79 year old female comes in for cough, shob, nausea/vomiting starting last night. States symptoms started after laying flat, with cough, and shortness of breath. After sitting up and coughing, had nausea with 15 episodes of vomiting. States looked more like mucous, denies hematemesis. Had some sore throat after the cough. However, states symptoms have completely resolved, and now just feels slightly fatigued. Denies fever, chest pain. Denies shortness of breath, weakness, dizziness. Denies abdominal pain, constipation, diarrhea. Denies rhinorrhea, nasal congestion. Denies leg swelling. States just started amitriptyline, and thinks symptoms are due to this. Discussed with prescribing provider, who told her to stop medication.      Past Medical History:  Diagnosis Date  . Anxiety   . Arthritis    RA  . Carpal tunnel syndrome, bilateral   . Chronic kidney disease    CKD stage III, absence of left kidney  . CKD (chronic kidney disease) stage 3, GFR 30-59 ml/min 09/06/2019  . Congenital absence of one kidney    Pt has right kidney only  . Depression   . Dyslipidemia 09/06/2019  . Essential hypertension 09/06/2019  . GERD (gastroesophageal reflux disease)   . Headache   . Heart murmur   . Hypercholesteremia   . Hypertension   . Peripheral vascular disease ([place])    a. s/p R external iliac stent '06 with angioplasty '13 (Dr. Benjamine Sprague) b. 01/2016: s/p PTA/stenting in L common iliac artery (Dr. Gwenlyn Found)  . Polymyalgia rheumatica ([place])   . Psoriasis   . Tremor 09/06/2019    Patient Active Problem List   Diagnosis Date Noted  . Ambulatory dysfunction 09/07/2019  . Acute respiratory failure with hypoxia ([place]) 09/06/2019  . Essential hypertension 09/06/2019  . Dyslipidemia 09/06/2019  .  CKD (chronic kidney disease) stage 3, GFR 30-59 ml/min 09/06/2019  . Tremor 09/06/2019  . Acute kidney injury superimposed on CKD ([place]) 09/06/2019  . Fall at home, initial encounter 09/06/2019  . Homicidal ideation   . Psychosocial stressors 04/03/2018  . Primary open angle glaucoma (POAG) of both eyes, severe stage 12/25/2017  . Polypharmacy 11/11/2017  . Congenital absence of one kidney 09/05/2017  . Anemia in chronic kidney disease 09/24/2016  . Stage 3 chronic kidney disease 09/24/2016  . Peripheral vascular disease ([place])   . Acrochordon 08/03/2015  . Stasis dermatitis of both legs 01/18/2015  . Radiculopathy of lumbar region 01/06/2015  . Seborrheic keratosis 11/24/2014  . OSA (obstructive sleep apnea) 09/21/2014  . Osteoporosis 06/29/2014  . Thyroid nodule 06/29/2014  . Breast calcifications 06/29/2014  . Glaucoma 01/08/2014  . Basal cell carcinoma of scalp 11/12/2013  . Presbyopia 10/22/2013  . Resting tremor 10/14/2013  . Polymyalgia rheumatica ([place]) 09/11/2013  . GERD (gastroesophageal reflux disease) 09/11/2013  . Major depressive disorder, recurrent episode, severe ([place]) 05/14/2013  . Pulmonary nodules 05/24/2012  . Carotid bruit 04/30/2012  . Essential hypertension 04/27/2012  . Anxiety state 04/27/2012  . Mixed hyperlipidemia 04/27/2012  . Cervical spondylosis with radiculopathy 04/19/2012    Past Surgical History:  Procedure Laterality Date  . ABDOMINAL HYSTERECTOMY  1963   Partial,  Due to bleeding after delivery  . ANTERIOR CERVICAL DECOMP/DISCECTOMY FUSION  04/19/2012   Procedure: ANTERIOR CERVICAL DECOMPRESSION/DISCECTOMY FUSION 2 LEVELS;  Surgeon: Winfield Cunas, MD;  Location: [facility];  Service: Neurosurgery;  Laterality: N/A;  Cervical four-five,Cervical five-six anterior cervical decompression with fusion plating and bonegraft possible posterior cervical decompression  . APPENDECTOMY    . CARPAL TUNNEL RELEASE  1990  . CERVICAL FUSION  04/19/2012    . EYE SURGERY    . ILIAC ARTERY STENT    . PERIPHERAL VASCULAR CATHETERIZATION N/A 01/31/2016   Procedure: Abdominal Aortogram;  Surgeon: [name], MD;  Location: [facility];  Service: Cardiovascular;  Laterality: N/A;  . PERIPHERAL VASCULAR CATHETERIZATION Bilateral 01/31/2016   Procedure: Lower Extremity Angiography;  Surgeon: [name], MD;  Location: [facility];  Service: Cardiovascular;  Laterality: Bilateral;  . PERIPHERAL VASCULAR CATHETERIZATION Left 01/31/2016   Procedure: Peripheral Vascular Intervention;  Surgeon: [name], MD;  Location: [facility];  Service: Cardiovascular;  Laterality: Left;  ILIAC  . TONSILLECTOMY      OB History    Gravida  3   Para  3   Term  0   Preterm  0   AB  0   Living        SAB  0   TAB  0   Ectopic  0   Multiple      Live Births               Home Medications    Prior to Admission medications   Medication Sig Start Date End Date Taking? Authorizing Provider  albuterol (VENTOLIN HFA) 108 (90 Base) MCG/ACT inhaler Inhale 2 puffs into the lungs every 6 (six) hours as needed for cough, wheezing or shortness of breath. 07/22/19   [provider]  amitriptyline (ELAVIL) 10 MG tablet Take 5 mg by mouth at bedtime. 12/25/19   [provider]  amLODipine (NORVASC) 5 MG tablet TAKE 1 TABLET(5 MG) BY MOUTH DAILY 10/15/19   Nicolette Bang, DO  Ascorbic Acid (VITAMIN C PO) Take 1 tablet by mouth daily.    [provider]  aspirin EC 81 MG EC tablet Take 1 tablet (81 mg total) by mouth daily. 02/01/16   Eileen Stanford, PA-C  brimonidine (ALPHAGAN) 0.15 % ophthalmic solution Place 1 drop into both eyes 3 (three) times daily. 04/30/19 04/29/20  [provider]  busPIRone (BUSPAR) 15 MG tablet Take 1 tablet (15 mg total) by mouth 3 (three) times daily. 11/27/19   Nicolette Bang, DO  cholecalciferol (VITAMIN D) 1000 units tablet Take 1,000  Units by mouth daily.    [provider]  clopidogrel (PLAVIX) 75 MG tablet Take 1 tablet (75 mg total) by mouth every morning. 02/18/18   [name], MD  COSOPT PF 2-0.5 % SOLN ophthalmic solution Place 1 drop into both eyes 2 (two) times daily. 07/22/19   [provider]  Cyanocobalamin (VITAMIN B 12 PO) Take 1 tablet by mouth daily. 12/25/12   [provider]  divalproex (DEPAKOTE) 125 MG DR tablet Take 125 mg by mouth 2 (two) times daily. 08/12/19   [provider]  Dorzolamide HCl-Timolol Mal PF 2-0.5 % SOLN Place 1 drop into both eyes 2 (two) times daily. 04/30/19   [provider]  ferrous sulfate 325 (65 FE) MG tablet Take 325 mg by mouth daily. 04/02/19   Haydee Salter Np, NP  furosemide (LASIX) 20 MG tablet Take 20 mg by mouth daily. 08/21/19   [provider]  gabapentin (NEURONTIN) 100 MG capsule Take  1 capsule (100 mg total) by mouth 3 (three) times daily. 01/01/20   Nicolette Bang, DO  Latanoprostene Bunod 0.024 % SOLN Place 1 drop into both eyes at bedtime. 04/30/19   [provider]  meclizine (ANTIVERT) 25 MG tablet Take 1 tablet (25 mg total) by mouth 2 (two) times daily as needed for dizziness. 10/02/19   Nicolette Bang, DO  metoprolol tartrate (LOPRESSOR) 25 MG tablet Take 0.5 tablets (12.5 mg total) by mouth 2 (two) times daily. 05/28/18   [name], MD  mirtazapine (REMERON) 15 MG tablet Take 1 tablet (15 mg total) by mouth at bedtime. 09/11/18   Zenia Resides, MD  pantoprazole (PROTONIX) 40 MG tablet Take 40 mg by mouth daily. 08/21/19   [provider]  pregabalin (LYRICA) 25 MG capsule Take 25 mg by mouth 3 (three) times daily as needed. 08/14/19   [provider]  traMADol (ULTRAM) 50 MG tablet Take 1 tablet (50 mg total) by mouth 3 (three) times daily as needed for moderate pain. 09/08/19   Arrien, Jimmy Picket, MD  VYZULTA 0.024 % SOLN Apply 1 drop to eye at bedtime.  07/30/19   [provider]    Family History Family History  Problem Relation Age of Onset  . Angina Mother   . Heart attack Father   . Melanoma Brother   . Cancer Sister        Lymphoma    Social History Social History   Tobacco Use  . Smoking status: Former Smoker    Packs/day: 1.00    Years: 40.00    Pack years: 40.00    Types: Cigarettes    Quit date: 07/24/2010    Years since quitting: 9.5  . Smokeless tobacco: Never Used  . Tobacco comment: STARTED BACK JULY 2013 AND RECENTLY QUIT 03-10-2012  Vaping Use  . Vaping Use: Never used  Substance Use Topics  . Alcohol use: Never    Comment: QUIT IN 1999  . Drug use: Never     Allergies   Gabapentin, Statins, Gabapentin, and Statins   Review of Systems Review of Systems  Reason unable to perform ROS: See HPI as above.     Physical Exam Triage Vital Signs ED Triage Vitals  Enc Vitals Group     BP 02/18/20 0851 130/81     Pulse Rate 02/18/20 0851 94     Resp 02/18/20 0851 18     Temp 02/18/20 0851 98.1 F (36.7 C)     Temp Source 02/18/20 0851 Oral     SpO2 02/18/20 0851 93 %     Weight --      Height --      Head Circumference --      Peak Flow --      Pain Score 02/18/20 0853 0     Pain Loc --      Pain Edu? --      Excl. in [place]? --    No data found.  Updated Vital Signs BP 130/81 (BP Location: Left Arm)   Pulse 94   Temp 98.1 F (36.7 C) (Oral)   Resp 18   SpO2 95%   Physical Exam Constitutional:      General: She is not in acute distress.    Appearance: Normal appearance. She is well-developed. She is not toxic-appearing or diaphoretic.  HENT:     Head: Normocephalic and atraumatic.     Mouth/Throat:     Mouth: Mucous membranes are moist.  Pharynx: Oropharynx is clear. Uvula midline.  Eyes:     Conjunctiva/sclera: Conjunctivae normal.     Pupils: Pupils are equal, round, and reactive to light.  Cardiovascular:     Rate and Rhythm: Normal rate and regular rhythm.    Pulmonary:     Effort: Pulmonary effort is normal. No respiratory distress.     Comments: LCTAB. O2 recheck 95% without mask Abdominal:     General: Bowel sounds are normal.     Palpations: Abdomen is soft.     Tenderness: There is no abdominal tenderness. There is no guarding or rebound.  Musculoskeletal:     Cervical back: Normal range of motion and neck supple.     Right lower leg: No edema.     Left lower leg: No edema.  Skin:    General: Skin is warm and dry.  Neurological:     Mental Status: She is alert and oriented to person, place, and time.     Comments: Able to ambulate on own with walker at baseline.       UC Treatments / Results  Labs (all labs ordered are listed, but only abnormal results are displayed) Labs Reviewed  POCT URINALYSIS DIP (MANUAL ENTRY) - Abnormal; Notable for the following components:      Result Value   Leukocytes, UA Small (1+) (*)    All other components within normal limits    EKG   Radiology No results found.  Procedures Procedures (including critical care time)  Medications Ordered in UC Medications - No data to display  Initial Impression / Assessment and Plan / UC Course  I have reviewed the triage vital signs and the nursing notes.  Pertinent labs & imaging results that were available during my care of the patient were reviewed by me and considered in my medical decision making (see chart for details).    Patient able to tolerate fluid intake in office.  Currently symptoms has resolved.  Discussed to continue to monitor, and follow-up with PCP for further evaluation of medication management.  Return precautions given.  Patient expresses understanding and agrees with plan.  Final Clinical Impressions(s) / UC Diagnoses   Final diagnoses:  Cough  Intractable vomiting with nausea, unspecified vomiting type   ED Prescriptions    None     PDMP not reviewed this encounter.   Ok Edwards, PA-C 02/18/20 1352

## 2020-02-18 NOTE — Discharge Instructions (Signed)
Your exam was normal. Your vitals were normal as well. Given your symptoms have resolved, able to tolerate water, continue to monitor. Stop your amitriptyline as per your provider and follow up for reevaluation. If worsening symptoms, go to the emergency department for further evaluation.

## 2020-02-20 DIAGNOSIS — I10 Essential (primary) hypertension: Secondary | ICD-10-CM | POA: Diagnosis not present

## 2020-02-20 DIAGNOSIS — Z79899 Other long term (current) drug therapy: Secondary | ICD-10-CM | POA: Diagnosis not present

## 2020-02-20 DIAGNOSIS — R202 Paresthesia of skin: Secondary | ICD-10-CM | POA: Diagnosis not present

## 2020-02-20 DIAGNOSIS — M545 Low back pain: Secondary | ICD-10-CM | POA: Diagnosis not present

## 2020-02-20 DIAGNOSIS — R42 Dizziness and giddiness: Secondary | ICD-10-CM | POA: Diagnosis not present

## 2020-03-08 ENCOUNTER — Other Ambulatory Visit: Payer: Self-pay

## 2020-03-08 ENCOUNTER — Other Ambulatory Visit (HOSPITAL_COMMUNITY): Payer: Self-pay | Admitting: Cardiovascular Disease

## 2020-03-08 ENCOUNTER — Ambulatory Visit (HOSPITAL_BASED_OUTPATIENT_CLINIC_OR_DEPARTMENT_OTHER)
Admission: RE | Admit: 2020-03-08 | Discharge: 2020-03-08 | Disposition: A | Discharge status: Home / Self Care | Payer: PPO | Source: Ambulatory Visit | Attending: Cardiovascular Disease | Admitting: Cardiovascular Disease

## 2020-03-08 ENCOUNTER — Ambulatory Visit (HOSPITAL_COMMUNITY)
Admission: RE | Admit: 2020-03-08 | Discharge: 2020-03-08 | Disposition: A | Discharge status: Home / Self Care | Payer: PPO | Source: Ambulatory Visit | Attending: Cardiology | Admitting: Cardiology

## 2020-03-08 ENCOUNTER — Ambulatory Visit (HOSPITAL_BASED_OUTPATIENT_CLINIC_OR_DEPARTMENT_OTHER)
Admission: RE | Admit: 2020-03-08 | Discharge: 2020-03-08 | Disposition: A | Discharge status: Home / Self Care | Payer: PPO | Source: Ambulatory Visit | Attending: Cardiology | Admitting: Cardiology

## 2020-03-08 DIAGNOSIS — I700 Atherosclerosis of aorta: Secondary | ICD-10-CM

## 2020-03-08 DIAGNOSIS — I6523 Occlusion and stenosis of bilateral carotid arteries: Secondary | ICD-10-CM

## 2020-03-08 DIAGNOSIS — Z9862 Peripheral vascular angioplasty status: Secondary | ICD-10-CM

## 2020-03-08 DIAGNOSIS — I739 Peripheral vascular disease, unspecified: Secondary | ICD-10-CM | POA: Insufficient documentation

## 2020-03-08 DIAGNOSIS — Z95828 Presence of other vascular implants and grafts: Secondary | ICD-10-CM | POA: Diagnosis not present

## 2020-03-17 ENCOUNTER — Other Ambulatory Visit: Payer: Self-pay

## 2020-03-17 DIAGNOSIS — I6523 Occlusion and stenosis of bilateral carotid arteries: Secondary | ICD-10-CM

## 2020-03-17 DIAGNOSIS — I700 Atherosclerosis of aorta: Secondary | ICD-10-CM

## 2020-03-17 DIAGNOSIS — I739 Peripheral vascular disease, unspecified: Secondary | ICD-10-CM

## 2020-03-17 NOTE — Progress Notes (Signed)
vas 

## 2020-03-18 DIAGNOSIS — M79675 Pain in left toe(s): Secondary | ICD-10-CM | POA: Diagnosis not present

## 2020-03-18 DIAGNOSIS — M79674 Pain in right toe(s): Secondary | ICD-10-CM | POA: Diagnosis not present

## 2020-03-18 DIAGNOSIS — B351 Tinea unguium: Secondary | ICD-10-CM | POA: Diagnosis not present

## 2020-03-19 DIAGNOSIS — Z79899 Other long term (current) drug therapy: Secondary | ICD-10-CM | POA: Diagnosis not present

## 2020-03-19 DIAGNOSIS — M545 Low back pain: Secondary | ICD-10-CM | POA: Diagnosis not present

## 2020-03-19 DIAGNOSIS — E559 Vitamin D deficiency, unspecified: Secondary | ICD-10-CM | POA: Diagnosis not present

## 2020-03-19 DIAGNOSIS — R202 Paresthesia of skin: Secondary | ICD-10-CM | POA: Diagnosis not present

## 2020-03-19 DIAGNOSIS — E612 Magnesium deficiency: Secondary | ICD-10-CM | POA: Diagnosis not present

## 2020-03-19 DIAGNOSIS — I10 Essential (primary) hypertension: Secondary | ICD-10-CM | POA: Diagnosis not present

## 2020-03-19 DIAGNOSIS — R42 Dizziness and giddiness: Secondary | ICD-10-CM | POA: Diagnosis not present

## 2020-04-01 ENCOUNTER — Telehealth: Payer: Self-pay

## 2020-04-01 NOTE — Telephone Encounter (Signed)
Called and spoke w/pt regarding the d/c of repatha it was because of the cost and they needed new pt assistance forms so I mailed them out to the pt today

## 2020-05-11 DIAGNOSIS — R202 Paresthesia of skin: Secondary | ICD-10-CM | POA: Diagnosis not present

## 2020-05-11 DIAGNOSIS — Z9181 History of falling: Secondary | ICD-10-CM | POA: Diagnosis not present

## 2020-05-11 DIAGNOSIS — M79604 Pain in right leg: Secondary | ICD-10-CM | POA: Diagnosis not present

## 2020-05-11 DIAGNOSIS — Z79899 Other long term (current) drug therapy: Secondary | ICD-10-CM | POA: Diagnosis not present

## 2020-05-11 DIAGNOSIS — M545 Low back pain, unspecified: Secondary | ICD-10-CM | POA: Diagnosis not present

## 2020-06-17 DIAGNOSIS — R202 Paresthesia of skin: Secondary | ICD-10-CM | POA: Diagnosis not present

## 2020-06-17 DIAGNOSIS — M79604 Pain in right leg: Secondary | ICD-10-CM | POA: Diagnosis not present

## 2020-06-17 DIAGNOSIS — Z79899 Other long term (current) drug therapy: Secondary | ICD-10-CM | POA: Diagnosis not present

## 2020-06-17 DIAGNOSIS — Z9181 History of falling: Secondary | ICD-10-CM | POA: Diagnosis not present

## 2020-06-17 DIAGNOSIS — M545 Low back pain, unspecified: Secondary | ICD-10-CM | POA: Diagnosis not present

## 2020-07-12 DIAGNOSIS — Z79899 Other long term (current) drug therapy: Secondary | ICD-10-CM | POA: Diagnosis not present

## 2020-07-12 DIAGNOSIS — M79604 Pain in right leg: Secondary | ICD-10-CM | POA: Diagnosis not present

## 2020-07-12 DIAGNOSIS — M545 Low back pain, unspecified: Secondary | ICD-10-CM | POA: Diagnosis not present

## 2020-07-12 DIAGNOSIS — R202 Paresthesia of skin: Secondary | ICD-10-CM | POA: Diagnosis not present

## 2020-07-12 DIAGNOSIS — Z9181 History of falling: Secondary | ICD-10-CM | POA: Diagnosis not present

## 2020-09-01 ENCOUNTER — Emergency Department (HOSPITAL_COMMUNITY): Payer: PPO

## 2020-09-01 ENCOUNTER — Inpatient Hospital Stay (HOSPITAL_COMMUNITY)
Admission: EM | Admit: 2020-09-01 | Discharge: 2020-09-07 | DRG: 871 | Disposition: A | Discharge status: Skilled Nursing Facility | Payer: PPO | Attending: Student in an Organized Health Care Education/Training Program | Admitting: Student in an Organized Health Care Education/Training Program

## 2020-09-01 DIAGNOSIS — J9600 Acute respiratory failure, unspecified whether with hypoxia or hypercapnia: Secondary | ICD-10-CM | POA: Diagnosis not present

## 2020-09-01 DIAGNOSIS — Q600 Renal agenesis, unilateral: Secondary | ICD-10-CM

## 2020-09-01 DIAGNOSIS — R6520 Severe sepsis without septic shock: Secondary | ICD-10-CM | POA: Diagnosis present

## 2020-09-01 DIAGNOSIS — I959 Hypotension, unspecified: Secondary | ICD-10-CM | POA: Diagnosis not present

## 2020-09-01 DIAGNOSIS — R404 Transient alteration of awareness: Secondary | ICD-10-CM | POA: Diagnosis not present

## 2020-09-01 DIAGNOSIS — E43 Unspecified severe protein-calorie malnutrition: Secondary | ICD-10-CM | POA: Diagnosis not present

## 2020-09-01 DIAGNOSIS — Z66 Do not resuscitate: Secondary | ICD-10-CM | POA: Diagnosis present

## 2020-09-01 DIAGNOSIS — Z90710 Acquired absence of both cervix and uterus: Secondary | ICD-10-CM

## 2020-09-01 DIAGNOSIS — K219 Gastro-esophageal reflux disease without esophagitis: Secondary | ICD-10-CM | POA: Diagnosis present

## 2020-09-01 DIAGNOSIS — Z8249 Family history of ischemic heart disease and other diseases of the circulatory system: Secondary | ICD-10-CM

## 2020-09-01 DIAGNOSIS — L409 Psoriasis, unspecified: Secondary | ICD-10-CM | POA: Diagnosis present

## 2020-09-01 DIAGNOSIS — D6959 Other secondary thrombocytopenia: Secondary | ICD-10-CM | POA: Diagnosis not present

## 2020-09-01 DIAGNOSIS — I739 Peripheral vascular disease, unspecified: Secondary | ICD-10-CM | POA: Diagnosis present

## 2020-09-01 DIAGNOSIS — R41841 Cognitive communication deficit: Secondary | ICD-10-CM | POA: Diagnosis not present

## 2020-09-01 DIAGNOSIS — Z7902 Long term (current) use of antithrombotics/antiplatelets: Secondary | ICD-10-CM | POA: Diagnosis not present

## 2020-09-01 DIAGNOSIS — R339 Retention of urine, unspecified: Secondary | ICD-10-CM | POA: Diagnosis present

## 2020-09-01 DIAGNOSIS — M545 Low back pain, unspecified: Secondary | ICD-10-CM | POA: Diagnosis not present

## 2020-09-01 DIAGNOSIS — M353 Polymyalgia rheumatica: Secondary | ICD-10-CM | POA: Diagnosis present

## 2020-09-01 DIAGNOSIS — F419 Anxiety disorder, unspecified: Secondary | ICD-10-CM | POA: Diagnosis not present

## 2020-09-01 DIAGNOSIS — D539 Nutritional anemia, unspecified: Secondary | ICD-10-CM | POA: Diagnosis present

## 2020-09-01 DIAGNOSIS — D696 Thrombocytopenia, unspecified: Secondary | ICD-10-CM | POA: Diagnosis present

## 2020-09-01 DIAGNOSIS — N183 Chronic kidney disease, stage 3 unspecified: Secondary | ICD-10-CM | POA: Diagnosis present

## 2020-09-01 DIAGNOSIS — E785 Hyperlipidemia, unspecified: Secondary | ICD-10-CM | POA: Diagnosis present

## 2020-09-01 DIAGNOSIS — Z888 Allergy status to other drugs, medicaments and biological substances status: Secondary | ICD-10-CM | POA: Diagnosis not present

## 2020-09-01 DIAGNOSIS — D631 Anemia in chronic kidney disease: Secondary | ICD-10-CM | POA: Diagnosis present

## 2020-09-01 DIAGNOSIS — Z7982 Long term (current) use of aspirin: Secondary | ICD-10-CM

## 2020-09-01 DIAGNOSIS — J9601 Acute respiratory failure with hypoxia: Secondary | ICD-10-CM | POA: Diagnosis not present

## 2020-09-01 DIAGNOSIS — D531 Other megaloblastic anemias, not elsewhere classified: Secondary | ICD-10-CM | POA: Diagnosis not present

## 2020-09-01 DIAGNOSIS — M5416 Radiculopathy, lumbar region: Secondary | ICD-10-CM | POA: Diagnosis present

## 2020-09-01 DIAGNOSIS — J189 Pneumonia, unspecified organism: Secondary | ICD-10-CM | POA: Diagnosis present

## 2020-09-01 DIAGNOSIS — A419 Sepsis, unspecified organism: Principal | ICD-10-CM | POA: Diagnosis present

## 2020-09-01 DIAGNOSIS — R0902 Hypoxemia: Secondary | ICD-10-CM | POA: Diagnosis not present

## 2020-09-01 DIAGNOSIS — W19XXXD Unspecified fall, subsequent encounter: Secondary | ICD-10-CM | POA: Diagnosis not present

## 2020-09-01 DIAGNOSIS — N1831 Chronic kidney disease, stage 3a: Secondary | ICD-10-CM | POA: Diagnosis not present

## 2020-09-01 DIAGNOSIS — F411 Generalized anxiety disorder: Secondary | ICD-10-CM | POA: Diagnosis not present

## 2020-09-01 DIAGNOSIS — M47892 Other spondylosis, cervical region: Secondary | ICD-10-CM | POA: Diagnosis not present

## 2020-09-01 DIAGNOSIS — N189 Chronic kidney disease, unspecified: Secondary | ICD-10-CM | POA: Diagnosis present

## 2020-09-01 DIAGNOSIS — F32A Depression, unspecified: Secondary | ICD-10-CM | POA: Diagnosis present

## 2020-09-01 DIAGNOSIS — R011 Cardiac murmur, unspecified: Secondary | ICD-10-CM | POA: Diagnosis present

## 2020-09-01 DIAGNOSIS — R4781 Slurred speech: Secondary | ICD-10-CM | POA: Diagnosis not present

## 2020-09-01 DIAGNOSIS — H401133 Primary open-angle glaucoma, bilateral, severe stage: Secondary | ICD-10-CM | POA: Diagnosis not present

## 2020-09-01 DIAGNOSIS — J9811 Atelectasis: Secondary | ICD-10-CM | POA: Diagnosis not present

## 2020-09-01 DIAGNOSIS — Z20822 Contact with and (suspected) exposure to covid-19: Secondary | ICD-10-CM | POA: Diagnosis not present

## 2020-09-01 DIAGNOSIS — Z79899 Other long term (current) drug therapy: Secondary | ICD-10-CM | POA: Diagnosis not present

## 2020-09-01 DIAGNOSIS — Z043 Encounter for examination and observation following other accident: Secondary | ICD-10-CM | POA: Diagnosis not present

## 2020-09-01 DIAGNOSIS — I878 Other specified disorders of veins: Secondary | ICD-10-CM | POA: Diagnosis present

## 2020-09-01 DIAGNOSIS — F332 Major depressive disorder, recurrent severe without psychotic features: Secondary | ICD-10-CM | POA: Diagnosis not present

## 2020-09-01 DIAGNOSIS — G4733 Obstructive sleep apnea (adult) (pediatric): Secondary | ICD-10-CM | POA: Diagnosis not present

## 2020-09-01 DIAGNOSIS — C4491 Basal cell carcinoma of skin, unspecified: Secondary | ICD-10-CM | POA: Diagnosis not present

## 2020-09-01 DIAGNOSIS — R4182 Altered mental status, unspecified: Secondary | ICD-10-CM | POA: Diagnosis not present

## 2020-09-01 DIAGNOSIS — M069 Rheumatoid arthritis, unspecified: Secondary | ICD-10-CM | POA: Diagnosis present

## 2020-09-01 DIAGNOSIS — I129 Hypertensive chronic kidney disease with stage 1 through stage 4 chronic kidney disease, or unspecified chronic kidney disease: Secondary | ICD-10-CM | POA: Diagnosis present

## 2020-09-01 DIAGNOSIS — I10 Essential (primary) hypertension: Secondary | ICD-10-CM | POA: Diagnosis not present

## 2020-09-01 DIAGNOSIS — M6281 Muscle weakness (generalized): Secondary | ICD-10-CM | POA: Diagnosis not present

## 2020-09-01 DIAGNOSIS — N179 Acute kidney failure, unspecified: Secondary | ICD-10-CM | POA: Diagnosis not present

## 2020-09-01 DIAGNOSIS — Z87440 Personal history of urinary (tract) infections: Secondary | ICD-10-CM

## 2020-09-01 DIAGNOSIS — R251 Tremor, unspecified: Secondary | ICD-10-CM | POA: Diagnosis not present

## 2020-09-01 HISTORY — DX: Sepsis, unspecified organism: A41.9

## 2020-09-01 LAB — CBC WITH DIFFERENTIAL/PLATELET
Abs Immature Granulocytes: 0.03 10*3/uL (ref 0.00–0.07)
Basophils Absolute: 0.1 10*3/uL (ref 0.0–0.1)
Basophils Relative: 1 %
Eosinophils Absolute: 0 10*3/uL (ref 0.0–0.5)
Eosinophils Relative: 0 %
HCT: 35.2 % — ABNORMAL LOW (ref 36.0–46.0)
Hemoglobin: 11.9 g/dL — ABNORMAL LOW (ref 12.0–15.0)
Immature Granulocytes: 0 %
Lymphocytes Relative: 7 %
Lymphs Abs: 0.7 10*3/uL (ref 0.7–4.0)
MCH: 35.3 pg — ABNORMAL HIGH (ref 26.0–34.0)
MCHC: 33.8 g/dL (ref 30.0–36.0)
MCV: 104.5 fL — ABNORMAL HIGH (ref 80.0–100.0)
Monocytes Absolute: 0.9 10*3/uL (ref 0.1–1.0)
Monocytes Relative: 9 %
Neutro Abs: 9 10*3/uL — ABNORMAL HIGH (ref 1.7–7.7)
Neutrophils Relative %: 83 %
Platelets: 110 10*3/uL — ABNORMAL LOW (ref 150–400)
RBC: 3.37 MIL/uL — ABNORMAL LOW (ref 3.87–5.11)
RDW: 13.9 % (ref 11.5–15.5)
WBC: 10.7 10*3/uL — ABNORMAL HIGH (ref 4.0–10.5)
nRBC: 0.2 % (ref 0.0–0.2)

## 2020-09-01 LAB — I-STAT VENOUS BLOOD GAS, ED
Acid-base deficit: 4 mmol/L — ABNORMAL HIGH (ref 0.0–2.0)
Bicarbonate: 23.1 mmol/L (ref 20.0–28.0)
Calcium, Ion: 1.2 mmol/L (ref 1.15–1.40)
HCT: 29 % — ABNORMAL LOW (ref 36.0–46.0)
Hemoglobin: 9.9 g/dL — ABNORMAL LOW (ref 12.0–15.0)
O2 Saturation: 59 %
Potassium: 3.8 mmol/L (ref 3.5–5.1)
Sodium: 141 mmol/L (ref 135–145)
TCO2: 25 mmol/L (ref 22–32)
pCO2, Ven: 48.3 mmHg (ref 44.0–60.0)
pH, Ven: 7.288 (ref 7.250–7.430)
pO2, Ven: 35 mmHg (ref 32.0–45.0)

## 2020-09-01 LAB — URINALYSIS, ROUTINE W REFLEX MICROSCOPIC
Bilirubin Urine: NEGATIVE
Glucose, UA: NEGATIVE mg/dL
Hgb urine dipstick: NEGATIVE
Ketones, ur: NEGATIVE mg/dL
Leukocytes,Ua: NEGATIVE
Nitrite: NEGATIVE
Protein, ur: NEGATIVE mg/dL
Specific Gravity, Urine: 1.021 (ref 1.005–1.030)
pH: 5 (ref 5.0–8.0)

## 2020-09-01 LAB — PROTIME-INR
INR: 1 (ref 0.8–1.2)
Prothrombin Time: 13.2 seconds (ref 11.4–15.2)

## 2020-09-01 LAB — COMPREHENSIVE METABOLIC PANEL
ALT: 7 U/L (ref 0–44)
AST: 19 U/L (ref 15–41)
Albumin: 3.5 g/dL (ref 3.5–5.0)
Alkaline Phosphatase: 54 U/L (ref 38–126)
Anion gap: 14 (ref 5–15)
BUN: 27 mg/dL — ABNORMAL HIGH (ref 8–23)
CO2: 21 mmol/L — ABNORMAL LOW (ref 22–32)
Calcium: 9.4 mg/dL (ref 8.9–10.3)
Chloride: 106 mmol/L (ref 98–111)
Creatinine, Ser: 1.61 mg/dL — ABNORMAL HIGH (ref 0.44–1.00)
GFR, Estimated: 32 mL/min — ABNORMAL LOW (ref >60)
Glucose, Bld: 97 mg/dL (ref 70–99)
Potassium: 3.7 mmol/L (ref 3.5–5.1)
Sodium: 141 mmol/L (ref 135–145)
Total Bilirubin: 0.3 mg/dL (ref 0.3–1.2)
Total Protein: 6 g/dL — ABNORMAL LOW (ref 6.5–8.1)

## 2020-09-01 LAB — CBC
HCT: 33.4 % — ABNORMAL LOW (ref 36.0–46.0)
Hemoglobin: 10.9 g/dL — ABNORMAL LOW (ref 12.0–15.0)
MCH: 35.6 pg — ABNORMAL HIGH (ref 26.0–34.0)
MCHC: 32.6 g/dL (ref 30.0–36.0)
MCV: 109.2 fL — ABNORMAL HIGH (ref 80.0–100.0)
Platelets: 97 10*3/uL — ABNORMAL LOW (ref 150–400)
RBC: 3.06 MIL/uL — ABNORMAL LOW (ref 3.87–5.11)
RDW: 14.3 % (ref 11.5–15.5)
WBC: 11.2 10*3/uL — ABNORMAL HIGH (ref 4.0–10.5)
nRBC: 0 % (ref 0.0–0.2)

## 2020-09-01 LAB — RESP PANEL BY RT-PCR (FLU A&B, COVID) ARPGX2
Influenza A by PCR: NEGATIVE
Influenza B by PCR: NEGATIVE
SARS Coronavirus 2 by RT PCR: NEGATIVE

## 2020-09-01 LAB — APTT: aPTT: 32 seconds (ref 24–36)

## 2020-09-01 LAB — TROPONIN I (HIGH SENSITIVITY): Troponin I (High Sensitivity): 9 ng/L (ref <18)

## 2020-09-01 LAB — LACTIC ACID, PLASMA
Lactic Acid, Venous: 0.9 mmol/L (ref 0.5–1.9)
Lactic Acid, Venous: 1.4 mmol/L (ref 0.5–1.9)

## 2020-09-01 MED ORDER — ALBUTEROL SULFATE HFA 108 (90 BASE) MCG/ACT IN AERS
2.0000 | INHALATION_SPRAY | Freq: Four times a day (QID) | RESPIRATORY_TRACT | Status: DC | PRN
  Administered 2020-09-04: 2 via RESPIRATORY_TRACT
  Filled 2020-09-01: qty 6.7

## 2020-09-01 MED ORDER — ACETAMINOPHEN 650 MG RE SUPP: 650.0000 mg | Freq: Four times a day (QID) | RECTAL | Status: DC | PRN

## 2020-09-01 MED ORDER — MIRTAZAPINE 15 MG PO TABS
15.0000 mg | ORAL_TABLET | Freq: Every day | ORAL | Status: DC
  Administered 2020-09-01 – 2020-09-06 (×6): 15 mg via ORAL
  Filled 2020-09-01 (×8): qty 1

## 2020-09-01 MED ORDER — POLYETHYLENE GLYCOL 3350 17 G PO PACK
17.0000 g | PACK | Freq: Every day | ORAL | Status: DC | PRN
  Administered 2020-09-05: 17 g via ORAL
  Filled 2020-09-01: qty 1

## 2020-09-01 MED ORDER — ONDANSETRON HCL 4 MG PO TABS: 4.0000 mg | ORAL_TABLET | Freq: Four times a day (QID) | ORAL | Status: DC | PRN

## 2020-09-01 MED ORDER — VITAMIN D 25 MCG (1000 UNIT) PO TABS
1000.0000 [IU] | ORAL_TABLET | Freq: Every day | ORAL | Status: DC
  Administered 2020-09-01 – 2020-09-07 (×7): 1000 [IU] via ORAL
  Filled 2020-09-01 (×7): qty 1

## 2020-09-01 MED ORDER — LATANOPROST 0.005 % OP SOLN
1.0000 [drp] | Freq: Every day | OPHTHALMIC | Status: DC
  Administered 2020-09-01 – 2020-09-06 (×6): 1 [drp] via OPHTHALMIC
  Filled 2020-09-01 (×2): qty 2.5

## 2020-09-01 MED ORDER — ACETAMINOPHEN 650 MG RE SUPP
650.0000 mg | Freq: Once | RECTAL | Status: AC
  Administered 2020-09-01: 650 mg via RECTAL
  Filled 2020-09-01: qty 1

## 2020-09-01 MED ORDER — LATANOPROSTENE BUNOD 0.024 % OP SOLN: 1.0000 [drp] | Freq: Every day | OPHTHALMIC | Status: DC

## 2020-09-01 MED ORDER — ENOXAPARIN SODIUM 30 MG/0.3ML ~~LOC~~ SOLN
30.0000 mg | SUBCUTANEOUS | Status: DC
  Administered 2020-09-01: 30 mg via SUBCUTANEOUS
  Filled 2020-09-01: qty 0.3

## 2020-09-01 MED ORDER — ACETAMINOPHEN 325 MG PO TABS
650.0000 mg | ORAL_TABLET | Freq: Four times a day (QID) | ORAL | Status: DC | PRN
  Administered 2020-09-05 – 2020-09-07 (×7): 650 mg via ORAL
  Filled 2020-09-01 (×7): qty 2

## 2020-09-01 MED ORDER — BUSPIRONE HCL 10 MG PO TABS
15.0000 mg | ORAL_TABLET | Freq: Three times a day (TID) | ORAL | Status: DC
  Administered 2020-09-01 – 2020-09-07 (×17): 15 mg via ORAL
  Filled 2020-09-01 (×17): qty 2

## 2020-09-01 MED ORDER — AMITRIPTYLINE HCL 10 MG PO TABS: 5.0000 mg | ORAL_TABLET | Freq: Every day | ORAL | Status: DC

## 2020-09-01 MED ORDER — VITAMIN B-12 100 MCG PO TABS
100.0000 ug | ORAL_TABLET | Freq: Every day | ORAL | Status: DC
  Administered 2020-09-02 – 2020-09-07 (×6): 100 ug via ORAL
  Filled 2020-09-01 (×7): qty 1

## 2020-09-01 MED ORDER — ASCORBIC ACID 500 MG PO TABS
250.0000 mg | ORAL_TABLET | Freq: Every day | ORAL | Status: DC
  Administered 2020-09-01 – 2020-09-07 (×7): 250 mg via ORAL
  Filled 2020-09-01 (×7): qty 1

## 2020-09-01 MED ORDER — VANCOMYCIN HCL 750 MG/150ML IV SOLN
750.0000 mg | INTRAVENOUS | Status: DC
  Administered 2020-09-02: 750 mg via INTRAVENOUS
  Filled 2020-09-01: qty 150

## 2020-09-01 MED ORDER — METOPROLOL TARTRATE 25 MG PO TABS: 12.5000 mg | ORAL_TABLET | Freq: Two times a day (BID) | ORAL | Status: DC

## 2020-09-01 MED ORDER — LACTATED RINGERS IV SOLN: INTRAVENOUS | Status: AC

## 2020-09-01 MED ORDER — LACTATED RINGERS IV BOLUS (SEPSIS)
1000.0000 mL | Freq: Once | INTRAVENOUS | Status: AC
  Administered 2020-09-01: 1000 mL via INTRAVENOUS

## 2020-09-01 MED ORDER — VANCOMYCIN HCL 1000 MG/200ML IV SOLN
1000.0000 mg | Freq: Once | INTRAVENOUS | Status: DC
Start: 2020-09-01 — End: 2020-09-01
  Filled 2020-09-01: qty 200

## 2020-09-01 MED ORDER — DORZOLAMIDE HCL-TIMOLOL MAL PF 2-0.5 % OP SOLN: 1.0000 [drp] | Freq: Two times a day (BID) | OPHTHALMIC | Status: DC

## 2020-09-01 MED ORDER — DIVALPROEX SODIUM 125 MG PO DR TAB
125.0000 mg | DELAYED_RELEASE_TABLET | Freq: Two times a day (BID) | ORAL | Status: DC
  Administered 2020-09-01 – 2020-09-07 (×12): 125 mg via ORAL
  Filled 2020-09-01 (×14): qty 1

## 2020-09-01 MED ORDER — AMLODIPINE BESYLATE 5 MG PO TABS: 5.0000 mg | ORAL_TABLET | Freq: Every day | ORAL | Status: DC

## 2020-09-01 MED ORDER — SODIUM CHLORIDE 0.9 % IV SOLN
2.0000 g | INTRAVENOUS | Status: DC
  Filled 2020-09-01: qty 2

## 2020-09-01 MED ORDER — CLOPIDOGREL BISULFATE 75 MG PO TABS
75.0000 mg | ORAL_TABLET | Freq: Every morning | ORAL | Status: DC
  Administered 2020-09-02 – 2020-09-07 (×6): 75 mg via ORAL
  Filled 2020-09-01 (×6): qty 1

## 2020-09-01 MED ORDER — ONDANSETRON HCL 4 MG/2ML IJ SOLN: 4.0000 mg | Freq: Four times a day (QID) | INTRAMUSCULAR | Status: DC | PRN

## 2020-09-01 MED ORDER — PANTOPRAZOLE SODIUM 40 MG PO TBEC
40.0000 mg | DELAYED_RELEASE_TABLET | Freq: Every day | ORAL | Status: DC
  Administered 2020-09-01 – 2020-09-07 (×7): 40 mg via ORAL
  Filled 2020-09-01 (×7): qty 1

## 2020-09-01 MED ORDER — ASPIRIN EC 81 MG PO TBEC
81.0000 mg | DELAYED_RELEASE_TABLET | Freq: Every day | ORAL | Status: DC
  Administered 2020-09-02 – 2020-09-07 (×6): 81 mg via ORAL
  Filled 2020-09-01 (×6): qty 1

## 2020-09-01 MED ORDER — DORZOLAMIDE HCL-TIMOLOL MAL PF 22.3-6.8 MG/ML OP SOLN: 1.0000 [drp] | Freq: Two times a day (BID) | OPHTHALMIC | Status: DC

## 2020-09-01 MED ORDER — SODIUM CHLORIDE 0.9 % IV SOLN
2.0000 g | Freq: Once | INTRAVENOUS | Status: AC
  Administered 2020-09-01: 2 g via INTRAVENOUS
  Filled 2020-09-01: qty 2

## 2020-09-01 MED ORDER — FUROSEMIDE 20 MG PO TABS: 20.0000 mg | ORAL_TABLET | Freq: Every day | ORAL | Status: DC

## 2020-09-01 MED ORDER — DORZOLAMIDE HCL-TIMOLOL MAL 2-0.5 % OP SOLN
1.0000 [drp] | Freq: Two times a day (BID) | OPHTHALMIC | Status: DC
  Administered 2020-09-02 – 2020-09-07 (×10): 1 [drp] via OPHTHALMIC
  Filled 2020-09-01 (×2): qty 10

## 2020-09-01 MED ORDER — ACETAMINOPHEN 325 MG PO TABS
650.0000 mg | ORAL_TABLET | Freq: Once | ORAL | Status: DC
  Filled 2020-09-01: qty 2

## 2020-09-01 MED ORDER — VANCOMYCIN HCL 1750 MG/350ML IV SOLN
1750.0000 mg | Freq: Once | INTRAVENOUS | Status: AC
  Administered 2020-09-01: 1750 mg via INTRAVENOUS
  Filled 2020-09-01: qty 350

## 2020-09-01 NOTE — Progress Notes (Signed)
Pharmacy Antibiotic Note  [name] [name] is a 80 y.o. female admitted on 09/01/2020 with sepsis.  Pharmacy has been consulted for Vancomycin and Cefepime dosing. WBC slightly elevated at 10.7, febrile 102.1. Scr 1.61 (baseline ~0.9), CrCl 25 ml/min.   Plan: Vancomycin 1750 mg IV once, then 750 mg IV q48 hrs (AUC 474, Scr 1.61, Vd 0.5) Cefepime 2 gm IV q24 hrs Monitor renal function, cultures/sensitivities, clinical progression     No data recorded.  No results for input(s): WBC, CREATININE, LATICACIDVEN, VANCOTROUGH, VANCOPEAK, VANCORANDOM, GENTTROUGH, GENTPEAK, GENTRANDOM, TOBRATROUGH, TOBRAPEAK, TOBRARND, AMIKACINPEAK, AMIKACINTROU, AMIKACIN in the last 168 hours.  CrCl cannot be calculated (Patient's most recent lab result is older than the maximum 21 days allowed.).    Allergies  Allergen Reactions  . Gabapentin   . Statins   . Gabapentin Itching    Was deleted since patient was taking, but now experiencing itching again.   . Statins Swelling, Rash and Other (See Comments)    Swelling involving tongue; also causes muscle pain    Antimicrobials this admission: Vancomycin 2/23 >>  Cefepime 2/23 >>   Dose adjustments this admission: N/A  Microbiology results: 2/23 BCx: pend 2/23 UCx: pend   Richardine Service, PharmD, BCPS PGY2 Cardiology Pharmacy Resident Phone: [phone] 09/01/2020  3:23 PM  Please check [email or web address] for unit-specific pharmacy phone numbers.

## 2020-09-01 NOTE — ED Notes (Signed)
Pt did not urinate, bladder scan performed, 330mL noted in bladder. I/O completed, 560mL out.

## 2020-09-01 NOTE — H&P (Signed)
Date: 09/01/2020               Patient Name:  [name] [name] MRN: [id number]  DOB: 11-Feb-1941 Age / Sex: 80 y.o., female   PCP: [name], MD         Medical Service: Internal Medicine Teaching Service         Attending Physician: Dr. Rebeca Alert Raynaldo Opitz, MD    First Contact: Dr. Alfonse Spruce Pager: [phone]  Second Contact: Dr. Laural Golden Pager: [phone]       After Hours (After 5p/  First Contact Pager: [phone]  weekends / holidays): Second Contact Pager: [phone]   Chief Complaint: Altered mental status  History of Present Illness: [name] is an 48 yoF with a medical history significant for chronic kidney disease, congenital solitary kidney, PVD, hypertension, HLD presenting to the emergency department for altered mental status.  History was taken by ED provider, daughter and patient.    She was found this morning by her daughter laying in her bed more confused than usual with slurred speech.  Daughter called EMS.  Daughter reports that the patient stated she had fallen down however was found to be lying in her bed.  States that if the patient had actually fallen she would not been able to get up herself.  When EMS arrived her oxygen saturations were in the 70s on room air.  She required nonrebreather with improvement in her saturations and slight improvement in her mentation.   On initial evaluation patient was seen to be very visibly shaky.  She reports that this is usual for her. She is alert and oriented to person, place and somewhat to time (stated it was September 06, 2020 and the president is Obama). She was able to state her address.  When asked why she was in the hospital she states she was told it was because she did not take her medications correctly; however she remembers taking her medications as prescribed.  She states that she does feel confused and "loopy".   She endorses back pain which she attributes to a fall she sustained this morning.  She states that she fell and  hit her butt/back floor.  Endorses shortness of breath, cough and sore throat.  States she has abdominal pain when using her walker.  Endorses some dysuria with blood in her urine.  Denies any pain in her legs but states they feel tired.  Denies chest pain, vomiting, diarrhea.  She lives at home alone. She needs help with buying groceries and laundry. Says she takes care of herself.  Spoke with her daughter who states that her nephew lives with her currently.  She states that she has a difficult relationship with her mother due to her wanting to maintain her independency.  Patient does allow her daughter to help manage her medications.   Meds:  No current facility-administered medications on file prior to encounter.   Current Outpatient Medications on File Prior to Encounter  Medication Sig Dispense Refill  . albuterol (VENTOLIN HFA) 108 (90 Base) MCG/ACT inhaler Inhale 2 puffs into the lungs every 6 (six) hours as needed for cough, wheezing or shortness of breath.    [name] amitriptyline (ELAVIL) 10 MG tablet Take 5 mg by mouth at bedtime.    [name] amLODipine (NORVASC) 5 MG tablet TAKE 1 TABLET(5 MG) BY MOUTH DAILY 90 tablet 0  . Ascorbic Acid (VITAMIN C PO) Take 1 tablet by mouth daily.    [name] aspirin  EC 81 MG EC tablet Take 1 tablet (81 mg total) by mouth daily.    . busPIRone (BUSPAR) 15 MG tablet Take 1 tablet (15 mg total) by mouth 3 (three) times daily. 90 tablet 2  . cholecalciferol (VITAMIN D) 1000 units tablet Take 1,000 Units by mouth daily.    . clopidogrel (PLAVIX) 75 MG tablet Take 1 tablet (75 mg total) by mouth every morning. 90 tablet 3  . COSOPT PF 2-0.5 % SOLN ophthalmic solution Place 1 drop into both eyes 2 (two) times daily.    . Cyanocobalamin (VITAMIN B 12 PO) Take 1 tablet by mouth daily.    . divalproex (DEPAKOTE) 125 MG DR tablet Take 125 mg by mouth 2 (two) times daily.    . Dorzolamide HCl-Timolol Mal PF 2-0.5 % SOLN Place 1 drop into both eyes 2 (two) times daily.    .  ferrous sulfate 325 (65 FE) MG tablet Take 325 mg by mouth daily.    . furosemide (LASIX) 20 MG tablet Take 20 mg by mouth daily.    [name] gabapentin (NEURONTIN) 100 MG capsule Take 1 capsule (100 mg total) by mouth 3 (three) times daily. 90 capsule 1  . Latanoprostene Bunod 0.024 % SOLN Place 1 drop into both eyes at bedtime.    . meclizine (ANTIVERT) 25 MG tablet Take 1 tablet (25 mg total) by mouth 2 (two) times daily as needed for dizziness. 30 tablet 2  . metoprolol tartrate (LOPRESSOR) 25 MG tablet Take 0.5 tablets (12.5 mg total) by mouth 2 (two) times daily. 30 tablet 11  . mirtazapine (REMERON) 15 MG tablet Take 1 tablet (15 mg total) by mouth at bedtime. 90 tablet 1  . pantoprazole (PROTONIX) 40 MG tablet Take 40 mg by mouth daily.    . pregabalin (LYRICA) 25 MG capsule Take 25 mg by mouth 3 (three) times daily as needed.    . traMADol (ULTRAM) 50 MG tablet Take 1 tablet (50 mg total) by mouth 3 (three) times daily as needed for moderate pain. 10 tablet 0  . VYZULTA 0.024 % SOLN Apply 1 drop to eye at bedtime.     No outpatient medications have been marked as taking for the 09/01/20 encounter [facility] Encounter).     Allergies: Allergies as of 09/01/2020 - Review Complete 02/18/2020  Allergen Reaction Noted  . Gabapentin  09/05/2019  . Statins  09/05/2019  . Gabapentin Itching 04/11/2012  . Statins Swelling, Rash, and Other (See Comments) 06/17/2013   Past Medical History:  Diagnosis Date  . Anxiety   . Arthritis    RA  . Carpal tunnel syndrome, bilateral   . Chronic kidney disease    CKD stage III, absence of left kidney  . CKD (chronic kidney disease) stage 3, GFR 30-59 ml/min 09/06/2019  . Congenital absence of one kidney    Pt has right kidney only  . Depression   . Dyslipidemia 09/06/2019  . Essential hypertension 09/06/2019  . GERD (gastroesophageal reflux disease)   . Headache   . Heart murmur   . Hypercholesteremia   . Hypertension   . Peripheral vascular disease  ([place])    a. s/p R external iliac stent '06 with angioplasty '13 (Dr. Benjamine Sprague) b. 01/2016: s/p PTA/stenting in L common iliac artery (Dr. Gwenlyn Found)  . Polymyalgia rheumatica ([place])   . Psoriasis   . Tremor 09/06/2019    Family History:  Family History  Problem Relation Age of Onset  . Angina Mother   . Heart  attack Father   . Melanoma Brother   . Cancer Sister        Lymphoma    Social History: Quit smoking cigarettes around the age of 51, started at 26. Drank alcohol until she was 19. Denies illicit drug use.   Review of Systems: A complete ROS was negative except as per HPI.   Physical Exam: Blood pressure 119/76, pulse 88, temperature 98.4 F (36.9 C), temperature source Oral, resp. rate 18, height 5' (1.524 m), weight 76.2 kg, SpO2 97 %.  Physical Exam Vitals reviewed.  Constitutional:      General: She is not in acute distress.    Appearance: She is obese. She is not ill-appearing or diaphoretic.  HENT:     Head: Normocephalic and atraumatic.  Eyes:     Extraocular Movements: Extraocular movements intact.     Conjunctiva/sclera: Conjunctivae normal.  Cardiovascular:     Rate and Rhythm: Normal rate and regular rhythm.     Heart sounds: Normal heart sounds. No murmur heard. No friction rub. No gallop.      Comments: Diminished pulses b/l LE Pulmonary:     Effort: Pulmonary effort is normal. No respiratory distress.     Breath sounds: Rales present. No wheezing.  Abdominal:     General: Abdomen is flat. Bowel sounds are normal. There is no distension.     Palpations: Abdomen is soft.     Tenderness: There is no abdominal tenderness. There is no guarding.  Musculoskeletal:        General: No swelling or tenderness.     Right lower leg: No edema.     Left lower leg: No edema.  Skin:    General: Skin is warm and dry.     Comments: B/l LE chronic venous stasis  Neurological:     Mental Status: She is alert and oriented to person, place, and time.     Comments: Alert and  oriented to self, place and time (stated it was Sep 06, 2020 and Obama was president)   Psychiatric:        Mood and Affect: Mood normal.        Behavior: Behavior normal.        Thought Content: Thought content normal.        Judgment: Judgment normal.     EKG: personally reviewed my interpretation is NSR  CXR: personally reviewed my interpretation is right lower lobe airspace disease  Assessment & Plan by Problem: Active Problems:   Sepsis with acute hypoxic respiratory failure ([place])  [name] [name] is an 72 yoF with a medical history significant for chronic kidney disease, congenital solitary kidney, PVD, hypertension, HLD presenting to the emergency department for altered mental status.  Found to be septic secondary to a right lower lobe pneumonia.  Sepsis with acute hypoxic respiratory failure Altered mental status Upon EMS arrival patient was saturating in the 70s on room air.  Improved with nonrebreather.  In the ED, febrile to 102.26F, tachypneic and hypotensive.  Status post 1 L LR bolus. Saturating 97% on 4 L nasal cannula.  Chest radiograph demonstrated right lower lobe consolidation consistent with pneumonia.  Started on broad-spectrum antibiotics, cefepime and vancomycin.  Mentation has seemed to improve since admission, near baseline however mumbling at times with incoherent thoughts.  Will continue IV antibiotics and IV fluids. -Continue cefepime and vancomycin -Continue IV fluids LR 150 mils per hour -Follow-up UA, urine culture and blood cultures  Hypertension Holding home medications amlodipine  5 mg, furosemide 20 mg, and metoprolol tartrate 12.5 mg twice daily in the setting of sepsis  Depression Continue home medications buspirone 15 mg 3 times daily, divalproex 125 mg twice daily. Hold amitriptyline 5 mg in the setting of AMS.   PVD Bilateral lower extremities demonstrate chronic venous stasis.   -Continue Plavix 75 mg daily  GERD Continue pantoprazole 40 mg  daily  Lumbar radiculopathy Hold home medications gabapentin 100 mg 3 times daily, pregabalin 25 mg 3 times daily and tramadol 50 mg 3 times daily as needed, in the setting of altered mental status.  Diet: Heart healthy IV fluids: LR CODE STATUS: DNR  Dispo: Admit patient to Inpatient with expected length of stay greater than 2 midnights.  SignedMike Craze, DO 09/01/2020, 5:38 PM  Pager: [phone] After 5pm on weekdays and 1pm on weekends: On Call pager: [phone]

## 2020-09-01 NOTE — Plan of Care (Signed)
  Problem: Education: Goal: Knowledge of General Education information will improve Description: Including pain rating scale, medication(s)/side effects and non-pharmacologic comfort measures Outcome: Not Progressing   Problem: Health Behavior/Discharge Planning: Goal: Ability to manage health-related needs will improve Outcome: Not Progressing   Problem: Clinical Measurements: Goal: Ability to maintain clinical measurements within normal limits will improve Outcome: Not Progressing Goal: Will remain free from infection Outcome: Not Progressing Goal: Diagnostic test results will improve Outcome: Not Progressing Goal: Respiratory complications will improve Outcome: Not Progressing Goal: Cardiovascular complication will be avoided Outcome: Not Progressing   Problem: Activity: Goal: Risk for activity intolerance will decrease Outcome: Not Progressing   Problem: Nutrition: Goal: Adequate nutrition will be maintained Outcome: Not Progressing   Problem: Coping: Goal: Level of anxiety will decrease Outcome: Not Progressing   Problem: Elimination: Goal: Will not experience complications related to bowel motility Outcome: Not Progressing Goal: Will not experience complications related to urinary retention Outcome: Not Progressing   Problem: Safety: Goal: Ability to remain free from injury will improve Outcome: Not Progressing   Problem: Clinical Measurements: Goal: Ability to maintain a body temperature in the normal range will improve Outcome: Not Progressing   Problem: Respiratory: Goal: Ability to maintain adequate ventilation will improve Outcome: Not Progressing Goal: Ability to maintain a clear airway will improve Outcome: Not Progressing

## 2020-09-01 NOTE — ED Notes (Signed)
Pt returned from radiology.

## 2020-09-01 NOTE — Sepsis Progress Note (Signed)
[place] tracking code sepsis.  Notified bedside nurse of need to administer antibiotics.

## 2020-09-01 NOTE — ED Notes (Signed)
Pt unable to swallow pills at this time. Provider aware verbal order to give acetaminophen suppository

## 2020-09-01 NOTE — ED Notes (Signed)
Pt taken off NRB and placed on 4L nasal cannula

## 2020-09-01 NOTE — ED Triage Notes (Signed)
Patient BIB [facility] from home where she lives alone; with altered mental status, hypoxia, and dysuria. Patient does not wear supplemental O2 at baseline, placed on NRB by EMS for SpO2 less than 90% on 6L [place]. Oriented to self and location.  BP 120/60 Hr 100 SpO2 98% on NRB

## 2020-09-01 NOTE — ED Notes (Signed)
Attempted to call report, RN will call back.

## 2020-09-01 NOTE — ED Provider Notes (Addendum)
[facility] EMERGENCY DEPARTMENT Provider Note   CSN: [id number] Arrival date & time: 09/01/20  1310     History Chief Complaint  Patient presents with  . Altered Mental Status    [name] [name] is a 80 y.o. female.  Patient with history of chronic kidney disease and congenital solitary kidney, history of peripheral vascular disease with Plavix use, hypertension, hypercholesterolemia, history of UTI --presents the emergency department today for altered mental status.  Level 5 caveat due to altered mental status.  Patient is coming from home where she was found by her daughter.  It was reported that she was more confused than her baseline and her speech was slurred.  This prompted call to EMS.  Patient was found sitting in bed.  Her oxygen saturations were in the 70s on room air.  Patient was titrated from nasal cannula to nonrebreather with improvement in oxygen saturation and slight improvement in mentation.  Patient initially stated to EMS that she had a fall, however denied falling after being given oxygen.  Pt did report back pain at some point -- confirmed by telephone with patient's daughter.  No signs of trauma per EMS.  No medications or fluids prior to arrival.        Past Medical History:  Diagnosis Date  . Anxiety   . Arthritis    RA  . Carpal tunnel syndrome, bilateral   . Chronic kidney disease    CKD stage III, absence of left kidney  . CKD (chronic kidney disease) stage 3, GFR 30-59 ml/min 09/06/2019  . Congenital absence of one kidney    Pt has right kidney only  . Depression   . Dyslipidemia 09/06/2019  . Essential hypertension 09/06/2019  . GERD (gastroesophageal reflux disease)   . Headache   . Heart murmur   . Hypercholesteremia   . Hypertension   . Peripheral vascular disease ([place])    a. s/p R external iliac stent '06 with angioplasty '13 (Dr. Benjamine Sprague) b. 01/2016: s/p PTA/stenting in L common iliac artery (Dr. Gwenlyn Found)  . Polymyalgia rheumatica  ([place])   . Psoriasis   . Tremor 09/06/2019    Patient Active Problem List   Diagnosis Date Noted  . Ambulatory dysfunction 09/07/2019  . Acute respiratory failure with hypoxia ([place]) 09/06/2019  . Essential hypertension 09/06/2019  . Dyslipidemia 09/06/2019  . CKD (chronic kidney disease) stage 3, GFR 30-59 ml/min (H[facility]) 09/06/2019  . Tremor 09/06/2019  . Acute kidney injury superimposed on CKD ([place]) 09/06/2019  . Fall at home, initial encounter 09/06/2019  . Homicidal ideation   . Psychosocial stressors 04/03/2018  . Primary open angle glaucoma (POAG) of both eyes, severe stage 12/25/2017  . Polypharmacy 11/11/2017  . Congenital absence of one kidney 09/05/2017  . Anemia in chronic kidney disease 09/24/2016  . Stage 3 chronic kidney disease ([place]) 09/24/2016  . Peripheral vascular disease ([place])   . Acrochordon 08/03/2015  . Stasis dermatitis of both legs 01/18/2015  . Radiculopathy of lumbar region 01/06/2015  . Seborrheic keratosis 11/24/2014  . OSA (obstructive sleep apnea) 09/21/2014  . Osteoporosis 06/29/2014  . Thyroid nodule 06/29/2014  . Breast calcifications 06/29/2014  . Glaucoma 01/08/2014  . Basal cell carcinoma of scalp 11/12/2013  . Presbyopia 10/22/2013  . Resting tremor 10/14/2013  . Polymyalgia rheumatica ([place]) 09/11/2013  . GERD (gastroesophageal reflux disease) 09/11/2013  . Major depressive disorder, recurrent episode, severe ([place]) 05/14/2013  . Pulmonary nodules 05/24/2012  . Carotid bruit 04/30/2012  . Essential hypertension  04/27/2012  . Anxiety state 04/27/2012  . Mixed hyperlipidemia 04/27/2012  . Cervical spondylosis with radiculopathy 04/19/2012    Past Surgical History:  Procedure Laterality Date  . ABDOMINAL HYSTERECTOMY  1963   Partial,  Due to bleeding after delivery  . ANTERIOR CERVICAL DECOMP/DISCECTOMY FUSION  04/19/2012   Procedure: ANTERIOR CERVICAL DECOMPRESSION/DISCECTOMY FUSION 2 LEVELS;  Surgeon: Winfield Cunas, MD;  Location: [facility]  NEUR[facility] ORS;  Service: Neurosurgery;  Laterality: N/A;  Cervical four-five,Cervical five-six anterior cervical decompression with fusion plating and bonegraft possible posterior cervical decompression  . APPENDECTOMY    . CARPAL TUNNEL RELEASE  1990  . CERVICAL FUSION  04/19/2012  . EYE SURGERY    . ILIAC ARTERY STENT    . PERIPHERAL VASCULAR CATHETERIZATION N/A 01/31/2016   Procedure: Abdominal Aortogram;  Surgeon: [name], MD;  Location: [facility];  Service: Cardiovascular;  Laterality: N/A;  . PERIPHERAL VASCULAR CATHETERIZATION Bilateral 01/31/2016   Procedure: Lower Extremity Angiography;  Surgeon: [name], MD;  Location: [facility];  Service: Cardiovascular;  Laterality: Bilateral;  . PERIPHERAL VASCULAR CATHETERIZATION Left 01/31/2016   Procedure: Peripheral Vascular Intervention;  Surgeon: [name], MD;  Location: [facility];  Service: Cardiovascular;  Laterality: Left;  ILIAC  . TONSILLECTOMY       OB History    Gravida  3   Para  3   Term  0   Preterm  0   AB  0   Living        SAB  0   IAB  0   Ectopic  0   Multiple      Live Births              Family History  Problem Relation Age of Onset  . Angina Mother   . Heart attack Father   . Melanoma Brother   . Cancer Sister        Lymphoma    Social History   Tobacco Use  . Smoking status: Former Smoker    Packs/day: 1.00    Years: 40.00    Pack years: 40.00    Types: Cigarettes    Quit date: 07/24/2010    Years since quitting: 10.1  . Smokeless tobacco: Never Used  . Tobacco comment: STARTED BACK JULY 2013 AND RECENTLY QUIT 03-10-2012  Vaping Use  . Vaping Use: Never used  Substance Use Topics  . Alcohol use: Never    Comment: QUIT IN 1999  . Drug use: Never    Home Medications Prior to Admission medications   Medication Sig Start Date End Date Taking? Authorizing Provider  albuterol (VENTOLIN HFA) 108 (90 Base) MCG/ACT inhaler Inhale 2 puffs  into the lungs every 6 (six) hours as needed for cough, wheezing or shortness of breath. 07/22/19   [provider]  amitriptyline (ELAVIL) 10 MG tablet Take 5 mg by mouth at bedtime. 12/25/19   [provider]  amLODipine (NORVASC) 5 MG tablet TAKE 1 TABLET(5 MG) BY MOUTH DAILY 10/15/19   Nicolette Bang, DO  Ascorbic Acid (VITAMIN C PO) Take 1 tablet by mouth daily.    [provider]  aspirin EC 81 MG EC tablet Take 1 tablet (81 mg total) by mouth daily. 02/01/16   Eileen Stanford, PA-C  busPIRone (BUSPAR) 15 MG tablet Take 1 tablet (15 mg total) by mouth 3 (three) times daily. 11/27/19   Nicolette Bang, DO  cholecalciferol (VITAMIN D) 1000  units tablet Take 1,000 Units by mouth daily.    [provider]  clopidogrel (PLAVIX) 75 MG tablet Take 1 tablet (75 mg total) by mouth every morning. 02/18/18   [name], MD  COSOPT PF 2-0.5 % SOLN ophthalmic solution Place 1 drop into both eyes 2 (two) times daily. 07/22/19   [provider]  Cyanocobalamin (VITAMIN B 12 PO) Take 1 tablet by mouth daily. 12/25/12   [provider]  divalproex (DEPAKOTE) 125 MG DR tablet Take 125 mg by mouth 2 (two) times daily. 08/12/19   [provider]  Dorzolamide HCl-Timolol Mal PF 2-0.5 % SOLN Place 1 drop into both eyes 2 (two) times daily. 04/30/19   [provider]  ferrous sulfate 325 (65 FE) MG tablet Take 325 mg by mouth daily. 04/02/19   Haydee Salter Np, NP  furosemide (LASIX) 20 MG tablet Take 20 mg by mouth daily. 08/21/19   [provider]  gabapentin (NEURONTIN) 100 MG capsule Take 1 capsule (100 mg total) by mouth 3 (three) times daily. 01/01/20   Nicolette Bang, DO  Latanoprostene Bunod 0.024 % SOLN Place 1 drop into both eyes at bedtime. 04/30/19   [provider]  meclizine (ANTIVERT) 25 MG tablet Take 1 tablet (25 mg total) by mouth 2 (two) times daily as needed for dizziness. 10/02/19    Nicolette Bang, DO  metoprolol tartrate (LOPRESSOR) 25 MG tablet Take 0.5 tablets (12.5 mg total) by mouth 2 (two) times daily. 05/28/18   [name], MD  mirtazapine (REMERON) 15 MG tablet Take 1 tablet (15 mg total) by mouth at bedtime. 09/11/18   Zenia Resides, MD  pantoprazole (PROTONIX) 40 MG tablet Take 40 mg by mouth daily. 08/21/19   [provider]  pregabalin (LYRICA) 25 MG capsule Take 25 mg by mouth 3 (three) times daily as needed. 08/14/19   [provider]  traMADol (ULTRAM) 50 MG tablet Take 1 tablet (50 mg total) by mouth 3 (three) times daily as needed for moderate pain. 09/08/19   Arrien, Jimmy Picket, MD  VYZULTA 0.024 % SOLN Apply 1 drop to eye at bedtime. 07/30/19   [provider]    Allergies    Gabapentin, Statins, Gabapentin, and Statins  Review of Systems   Review of Systems  Unable to perform ROS: Mental status change    Physical Exam Updated Vital Signs BP 119/76 (BP Location: Left Arm)   Pulse 88   Temp (!) 102.1 F (38.9 C) (Rectal)   Resp 18   Ht 5' (1.524 m)   Wt 76.2 kg   SpO2 99%   BMI 32.81 kg/m   Physical Exam Vitals and nursing note reviewed.  Constitutional:      General: She is not in acute distress.    Appearance: She is well-developed.  HENT:     Head: Normocephalic and atraumatic.     Right Ear: External ear normal.     Left Ear: External ear normal.     Nose: Nose normal.     Mouth/Throat:     Mouth: Mucous membranes are dry.  Eyes:     Conjunctiva/sclera: Conjunctivae normal.  Cardiovascular:     Rate and Rhythm: Normal rate and regular rhythm.     Heart sounds: No murmur heard.   Pulmonary:     Effort: No respiratory distress.     Breath sounds: No wheezing, rhonchi or rales.  Abdominal:     Palpations: Abdomen is soft.  Tenderness: There is no abdominal tenderness. There is no guarding or rebound.  Musculoskeletal:     Cervical back: Normal range of motion and neck  supple.     Right lower leg: No edema.     Left lower leg: No edema.     Comments: No tenderness to palpation of neck  Skin:    General: Skin is warm and dry.     Findings: No rash.  Neurological:     General: No focal deficit present.     Mental Status: She is alert. She is disoriented.     GCS: GCS eye subscore is 4. GCS verbal subscore is 5. GCS motor subscore is 6.     Motor: Tremor present. No weakness.     Comments: Mild tremor of hands  Psychiatric:        Mood and Affect: Mood normal.     ED Results / Procedures / Treatments   Labs (all labs ordered are listed, but only abnormal results are displayed) Labs Reviewed  COMPREHENSIVE METABOLIC PANEL - Abnormal; Notable for the following components:      Result Value   CO2 21 (*)    BUN 27 (*)    Creatinine, Ser 1.61 (*)    Total Protein 6.0 (*)    GFR, Estimated 32 (*)    All other components within normal limits  CBC WITH DIFFERENTIAL/PLATELET - Abnormal; Notable for the following components:   WBC 10.7 (*)    RBC 3.37 (*)    Hemoglobin 11.9 (*)    HCT 35.2 (*)    MCV 104.5 (*)    MCH 35.3 (*)    Platelets 110 (*)    Neutro Abs 9.0 (*)    All other components within normal limits  RESP PANEL BY RT-PCR (FLU A&B, COVID) ARPGX2  URINE CULTURE  CULTURE, BLOOD (ROUTINE X 2)  CULTURE, BLOOD (ROUTINE X 2)  LACTIC ACID, PLASMA  PROTIME-INR  APTT  LACTIC ACID, PLASMA  URINALYSIS, ROUTINE W REFLEX MICROSCOPIC  I-STAT VENOUS BLOOD GAS, ED  TROPONIN I (HIGH SENSITIVITY)    ED ECG REPORT   Date: 09/01/2020  Rate: 90  Rhythm: normal sinus rhythm  QRS Axis: normal  Intervals: normal  ST/T Wave abnormalities: normal  Conduction Disutrbances:none  Narrative Interpretation: poor baseline multiple leads  Old EKG Reviewed: unchanged  I have personally reviewed the EKG tracing and disagree with the computerized printout as noted.   Radiology DG Chest Port 1 View  Result Date: 09/01/2020 CLINICAL DATA:   Questionable sepsis. EXAM: PORTABLE CHEST 1 VIEW COMPARISON:  04/03/2018 FINDINGS: Heart size is normal. Aortic atherosclerosis. Interstitial and airspace densities within the right midlung and right base identified compatible with pneumonia. Mild platelike atelectasis noted in the left base. Thoracolumbar scoliosis with degenerative disc disease. Status post ACDF. IMPRESSION: 1. Right midlung and right base opacities compatible with pneumonia. Followup PA and lateral chest X-ray is recommended in 3-4 weeks following trial of antibiotic therapy to ensure resolution and exclude underlying malignancy. 2. Aortic atherosclerosis. Electronically Signed   By: Kerby Moors M.D.   On: 09/01/2020 14:19    Procedures Procedures   Medications Ordered in ED Medications  lactated ringers infusion (has no administration in time range)  lactated ringers bolus 1,000 mL (1,000 mLs Intravenous New Bag/Given 09/01/20 1426)  acetaminophen (TYLENOL) tablet 650 mg (650 mg Oral Not Given 09/01/20 1405)  vancomycin (VANCOREADY) IVPB 1750 mg/350 mL (1,750 mg Intravenous New Bag/Given 09/01/20 1426)  ceFEPIme (MAXIPIME) 2 g  in sodium chloride 0.9 % 100 mL IVPB (2 g Intravenous New Bag/Given 09/01/20 1355)  acetaminophen (TYLENOL) suppository 650 mg (650 mg Rectal Given 09/01/20 1409)    ED Course  I have reviewed the triage vital signs and the nursing notes.  Pertinent labs & imaging results that were available during my care of the patient were reviewed by me and considered in my medical decision making (see chart for details).  Patient seen and examined on arrival, history obtained from EMS.  Labs and imaging orders placed (sepsis, altered LOC, head injury, cardiac).   Vital signs reviewed and are as follows: BP 120/72   Pulse 88   Temp (!) 102.1 F (38.9 C) (Rectal)   Resp 18   Ht 5' (1.524 m)   Wt 76.2 kg   SpO2 98%   BMI 32.81 kg/m   Short after arrival, temperature noted to be 102.1 F rectally.  Code  sepsis called.  Broad-spectrum antibiotics ordered.  Awaiting lactic.  Patient is currently satting 100% on nonrebreather.  2:08 PM Reviewed CXR, shows RLL PNA.   2:48 PM Updated patient's daughter [name] by telephone. She also mentioned patient complaint of back pain -- will image given ALOC. Patient rechecked, stable on 4L [place].   3:14 PM Spoke with IMTS who will see. Pt unassigned, PCP with [facility].   CRITICAL CARE Performed by: Carlisle Cater PA-C Total critical care time: 35 minutes Critical care time was exclusive of separately billable procedures and treating other patients. Critical care was necessary to treat or prevent imminent or life-threatening deterioration. Critical care was time spent personally by me on the following activities: development of treatment plan with patient and/or surrogate as well as nursing, discussions with consultants, evaluation of patient's response to treatment, examination of patient, obtaining history from patient or surrogate, ordering and performing treatments and interventions, ordering and review of laboratory studies, ordering and review of radiographic studies, pulse oximetry and re-evaluation of patient's condition.       MDM Rules/Calculators/A&P                          Admit.     Final Clinical Impression(s) / ED Diagnoses Final diagnoses:  Acute respiratory failure with hypoxia ([place])  Pneumonia of right lower lobe due to infectious organism    Rx / DC Orders ED Discharge Orders    None       Carlisle Cater, PA-C 09/01/20 1515    Carlisle Cater, PA-C 09/02/20 8003    Isla Pence, MD 09/04/20 1501

## 2020-09-02 DIAGNOSIS — I10 Essential (primary) hypertension: Secondary | ICD-10-CM | POA: Diagnosis not present

## 2020-09-02 DIAGNOSIS — D696 Thrombocytopenia, unspecified: Secondary | ICD-10-CM

## 2020-09-02 DIAGNOSIS — F32A Depression, unspecified: Secondary | ICD-10-CM | POA: Diagnosis not present

## 2020-09-02 DIAGNOSIS — M5416 Radiculopathy, lumbar region: Secondary | ICD-10-CM | POA: Diagnosis not present

## 2020-09-02 DIAGNOSIS — J189 Pneumonia, unspecified organism: Secondary | ICD-10-CM | POA: Diagnosis not present

## 2020-09-02 LAB — CBC
HCT: 34.7 % — ABNORMAL LOW (ref 36.0–46.0)
Hemoglobin: 11.1 g/dL — ABNORMAL LOW (ref 12.0–15.0)
MCH: 34.8 pg — ABNORMAL HIGH (ref 26.0–34.0)
MCHC: 32 g/dL (ref 30.0–36.0)
MCV: 108.8 fL — ABNORMAL HIGH (ref 80.0–100.0)
Platelets: 90 10*3/uL — ABNORMAL LOW (ref 150–400)
RBC: 3.19 MIL/uL — ABNORMAL LOW (ref 3.87–5.11)
RDW: 14.3 % (ref 11.5–15.5)
WBC: 14.8 10*3/uL — ABNORMAL HIGH (ref 4.0–10.5)
nRBC: 0 % (ref 0.0–0.2)

## 2020-09-02 LAB — BLOOD CULTURE ID PANEL (REFLEXED) - BCID2

## 2020-09-02 LAB — BASIC METABOLIC PANEL
Anion gap: 11 (ref 5–15)
BUN: 23 mg/dL (ref 8–23)
CO2: 20 mmol/L — ABNORMAL LOW (ref 22–32)
Calcium: 9.3 mg/dL (ref 8.9–10.3)
Chloride: 110 mmol/L (ref 98–111)
Creatinine, Ser: 1.28 mg/dL — ABNORMAL HIGH (ref 0.44–1.00)
GFR, Estimated: 42 mL/min — ABNORMAL LOW (ref >60)
Glucose, Bld: 65 mg/dL — ABNORMAL LOW (ref 70–99)
Potassium: 3.9 mmol/L (ref 3.5–5.1)
Sodium: 141 mmol/L (ref 135–145)

## 2020-09-02 LAB — FOLATE: Folate: 14 ng/mL (ref >5.9)

## 2020-09-02 LAB — VITAMIN B12: Vitamin B-12: 384 pg/mL (ref 180–914)

## 2020-09-02 MED ORDER — SODIUM CHLORIDE 0.9 % IV SOLN
1.0000 g | INTRAVENOUS | Status: DC
  Administered 2020-09-02: 1 g via INTRAVENOUS
  Filled 2020-09-02 (×2): qty 10

## 2020-09-02 MED ORDER — LACTATED RINGERS IV BOLUS
500.0000 mL | Freq: Once | INTRAVENOUS | Status: AC
  Administered 2020-09-02: 500 mL via INTRAVENOUS

## 2020-09-02 MED ORDER — AZITHROMYCIN 250 MG PO TABS
500.0000 mg | ORAL_TABLET | Freq: Every day | ORAL | Status: DC
  Administered 2020-09-02 – 2020-09-04 (×3): 500 mg via ORAL
  Filled 2020-09-02 (×2): qty 2

## 2020-09-02 NOTE — Evaluation (Signed)
Physical Therapy Evaluation Patient Details Name: [name] [name] MRN: [id number] DOB: 22-May-1941 Today's Date: 09/02/2020   History of Present Illness  [name] [name] is an 11 yoF with a medical history significant for chronic kidney disease, congenital solitary kidney, PVD, hypertension, HLD presenting to the emergency department for altered mental status.    Clinical Impression  Pt admitted with above diagnosis. PTA pt lived alone, mod I mobility using rollator. On eval, she required min guard assist bed mobility and min assist sit to stand with RW. Mobility limited by fatigue/lethargy. Pt requiring continuous cues to stay awake. RN reports pt has been awake and active since 6:30am. VSS on 4L.  Pt currently with functional limitations due to the deficits listed below (see PT Problem List). Pt will benefit from skilled PT to increase their independence and safety with mobility to allow discharge to the venue listed below.       Follow Up Recommendations SNF    Equipment Recommendations  None recommended by PT    Recommendations for Other Services       Precautions / Restrictions Precautions Precautions: Fall Restrictions Weight Bearing Restrictions: No      Mobility  Bed Mobility Overal bed mobility: Needs Assistance Bed Mobility: Supine to Sit;Sit to Supine     Supine to sit: Min guard;HOB elevated Sit to supine: Min guard;HOB elevated   General bed mobility comments: cues for sequencing and safety    Transfers Overall transfer level: Needs assistance Equipment used: Rolling walker (2 wheeled) Transfers: Sit to/from Stand Sit to Stand: Min assist         General transfer comment: assist to power up and stabilize balance  Ambulation/Gait             General Gait Details: unable to progress gait due to fatigue/lethargy  Stairs            Wheelchair Mobility    Modified Rankin (Stroke Patients Only)       Balance Overall balance assessment:  Needs assistance Sitting-balance support: No upper extremity supported;Feet supported Sitting balance-Leahy Scale: Fair     Standing balance support: Bilateral upper extremity supported;During functional activity Standing balance-Leahy Scale: Poor Standing balance comment: reliant on B UE support                             Pertinent Vitals/Pain Pain Assessment: Faces Faces Pain Scale: No hurt Pain Intervention(s): Monitored during session    Home Living Family/patient expects to be discharged to:: Private residence Living Arrangements: Alone Available Help at Discharge: Family;Available PRN/intermittently Type of Home: House Home Access: Stairs to enter Entrance Stairs-Rails: Right Entrance Stairs-Number of Steps: 5 Home Layout: One level Home Equipment: Walker - 2 wheels;Walker - 4 wheels;Shower seat;Wheelchair - manual;Bedside commode Additional Comments: Home setup info obtained from 2021 admission as pt poor historian. per current notes, pt's nephew may have been staying with pt though pt reports she lives alone    Prior Function Level of Independence: Needs assistance   Gait / Transfers Assistance Needed: Reports able to ambulate with Rollator and enjoys walking outside  ADL's / Homemaking Assistance Needed: Pt reports Modified Independence with ADLs, has assist with med mgmt from daughter per notes.  Comments: Unsure of accuracy as pt poor historian     Hand Dominance   Dominant Hand: Right    Extremity/Trunk Assessment   Upper Extremity Assessment Upper Extremity Assessment: Generalized weakness    Lower  Extremity Assessment Lower Extremity Assessment: Generalized weakness    Cervical / Trunk Assessment Cervical / Trunk Assessment: Kyphotic  Communication   Communication: No difficulties  Cognition Arousal/Alertness: Awake/alert Behavior During Therapy: WFL for tasks assessed/performed Overall Cognitive Status: Impaired/Different from  baseline Area of Impairment: Orientation;Attention;Following commands;Memory;Safety/judgement;Awareness;Problem solving                 Orientation Level: Disoriented to;Time;Situation;Place Current Attention Level: Sustained Memory: Decreased short-term memory Following Commands: Follows one step commands inconsistently;Follows one step commands with increased time Safety/Judgement: Decreased awareness of safety;Decreased awareness of deficits Awareness: Intellectual Problem Solving: Difficulty sequencing;Requires verbal cues;Requires tactile cues General Comments: Pt pleasantly confused, A&OX1. With repetition, pt able to recall being at [facility]. Highly distractible and requires redirection to attend to tasks. Pt with decreased awareness of safety, deficits - attempts to pull at lines      General Comments General comments (skin integrity, edema, etc.): VSS on 4L    Exercises     Assessment/Plan    PT Assessment Patient needs continued PT services  PT Problem List Decreased strength;Decreased mobility;Decreased safety awareness;Decreased activity tolerance;Cardiopulmonary status limiting activity;Decreased balance;Decreased cognition       PT Treatment Interventions Therapeutic activities;Gait training;Therapeutic exercise;Patient/family education;Balance training;Stair training;Functional mobility training    PT Goals (Current goals can be found in the Care Plan section)  Acute Rehab PT Goals Patient Stated Goal: home PT Goal Formulation: With patient Time For Goal Achievement: 09/16/20 Potential to Achieve Goals: Good    Frequency Min 3X/week   Barriers to discharge        Co-evaluation               AM-PAC PT "6 Clicks" Mobility  Outcome Measure Help needed turning from your back to your side while in a flat bed without using bedrails?: None Help needed moving from lying on your back to sitting on the side of a flat bed without using  bedrails?: A Little Help needed moving to and from a bed to a chair (including a wheelchair)?: A Little Help needed standing up from a chair using your arms (e.g., wheelchair or bedside chair)?: A Little Help needed to walk in hospital room?: A Lot Help needed climbing 3-5 steps with a railing? : A Lot 6 Click Score: 17    End of Session Equipment Utilized During Treatment: Gait belt Activity Tolerance: Patient limited by fatigue;Patient limited by lethargy Patient left: in bed;with call bell/phone within reach;with bed alarm set Nurse Communication: Mobility status PT Visit Diagnosis: Unsteadiness on feet (R26.81);Muscle weakness (generalized) (M62.81)    Time: 5625-6389 PT Time Calculation (min) (ACUTE ONLY): 12 min   Charges:   PT Evaluation $PT Eval Moderate Complexity: 1 Mod          Lorrin Goodell, PT  Office # [phone] Pager [phone]   Lorriane Shire 09/02/2020, 11:42 AM

## 2020-09-02 NOTE — Progress Notes (Signed)
Straight cath resulted 600 mls of clear yellow urine

## 2020-09-02 NOTE — Evaluation (Signed)
Occupational Therapy Evaluation Patient Details Name: [name] [name] MRN: [id number] DOB: 03-20-1941 Today's Date: 09/02/2020    History of Present Illness [name] is an 56 yoF with a medical history significant for chronic kidney disease, congenital solitary kidney, PVD, hypertension, HLD presenting to the emergency department for altered mental status.   Clinical Impression   PTA, pt reports living alone and able to complete ADLs/mobility with use of Rollator. Per chart, nephew may have been staying with pt and has assistance for IADLs but unsure of accuracy as pt poor historian. Pt initially A&Ox1, but with repetition, improved to A&Ox2. Pt pleasantly confused, easily distracted but eager to sit EOB. Pt overall min guard for bed mobility, Min A for taking steps at bedside with RW. Pt requires Min A for UB ADLs and Max A for LB ADLs due to deficits noted below. Pt primarily limited by impaired cognition and would have difficulty managing at home safely without assist. Recommend SNF for short term rehab at this time. Plan to progress ADL mobility, sequencing and attention during ADLs. SpO2 >90% on 4 L O2, 1/4 DOE.     Follow Up Recommendations  SNF;Supervision/Assistance - 24 hour    Equipment Recommendations  Other (comment) (to be determined)    Recommendations for Other Services       Precautions / Restrictions Precautions Precautions: Fall Restrictions Weight Bearing Restrictions: No      Mobility Bed Mobility Overal bed mobility: Needs Assistance Bed Mobility: Supine to Sit;Sit to Supine     Supine to sit: Supervision;HOB elevated Sit to supine: Min guard   General bed mobility comments: with multimodal cues, pt able to sit EOB without assist (eager to sit EOB). Min guard for guidance of B LE back into bed    Transfers Overall transfer level: Needs assistance Equipment used: Rolling walker (2 wheeled) Transfers: Sit to/from Stand Sit to Stand: Min guard          General transfer comment: min guard for sit to stand at bedside with RW. Min A for sequencing sidesteps at bedside using RW    Balance Overall balance assessment: Needs assistance Sitting-balance support: No upper extremity supported;Feet supported Sitting balance-Leahy Scale: Fair     Standing balance support: Bilateral upper extremity supported;During functional activity Standing balance-Leahy Scale: Poor Standing balance comment: reliant on B UE support                           ADL either performed or assessed with clinical judgement   ADL Overall ADL's : Needs assistance/impaired Eating/Feeding: Supervision/ safety;Sitting Eating/Feeding Details (indicate cue type and reason): Initially Min A with coffee cup due to shakiness in hands. But with coffee cooling off, provided lid and straw with decreased spillage and improved independence Grooming: Supervision/safety;Sitting;Brushing hair Grooming Details (indicate cue type and reason): Supervision to brush hair sitting EOB Upper Body Bathing: Supervision/ safety;Sitting   Lower Body Bathing: Sit to/from stand;Moderate assistance   Upper Body Dressing : Minimal assistance;Sitting   Lower Body Dressing: Maximal assistance;Sit to/from stand   Toilet Transfer: Minimal assistance;Stand-pivot;BSC;RW   Toileting- Clothing Manipulation and Hygiene: Maximal assistance;Sit to/from stand Toileting - Clothing Manipulation Details (indicate cue type and reason): increased assist due to incontinence       General ADL Comments: Pt limited primarily by cognition and poor safety awareness.     Vision Baseline Vision/History:  (to be further assessed. pt unable to answer) Patient Visual Report: No change  from baseline Vision Assessment?: No apparent visual deficits     Perception     Praxis      Pertinent Vitals/Pain Pain Assessment: Faces Faces Pain Scale: No hurt Pain Intervention(s): Monitored during session      Hand Dominance Right   Extremity/Trunk Assessment Upper Extremity Assessment Upper Extremity Assessment: Generalized weakness   Lower Extremity Assessment Lower Extremity Assessment: Defer to PT evaluation   Cervical / Trunk Assessment Cervical / Trunk Assessment: Kyphotic   Communication Communication Communication: No difficulties   Cognition Arousal/Alertness: Awake/alert Behavior During Therapy: Restless Overall Cognitive Status: Impaired/Different from baseline Area of Impairment: Orientation;Attention;Following commands;Memory;Safety/judgement;Awareness;Problem solving                 Orientation Level: Disoriented to;Time;Situation;Place Current Attention Level: Sustained Memory: Decreased short-term memory Following Commands: Follows one step commands with increased time Safety/Judgement: Decreased awareness of safety;Decreased awareness of deficits Awareness: Intellectual Problem Solving: Difficulty sequencing;Requires verbal cues;Requires tactile cues General Comments: Pt pleasantly confused, A&OX1. With repetition, pt able to recall being at [facility] Highly distractible and requires redirection to attend to tasks. Pt with decreased awareness of safety, deficits - attempts to pull at lines   General Comments  SpO2 95% on 4 L O2 at rest, 90% after delayed reading s/p activity. 1/4 DOE. Collab with nursing and ensuring pt back in bed at end of session due to confusion, high fall risk and difficulty redirecting to prevent attempting to get up unassisted at this time    Exercises     Shoulder Instructions      Home Living Family/patient expects to be discharged to:: Private residence Living Arrangements: Alone Available Help at Discharge: Family;Available PRN/intermittently Type of Home: House Home Access: Stairs to enter [employer] of Steps: 5   Home Layout: One level     Bathroom Shower/Tub: Engineer, site: Standard     Home Equipment: Environmental consultant - 2 wheels;Walker - 4 wheels;Shower seat;Wheelchair - manual;Bedside commode   Additional Comments: Home setup info obtained from 2021 admission as pt poor historian. per current notes, pt's nephew may have been staying with pt though pt reports she lives alone      Prior Functioning/Environment Level of Independence: Needs assistance  Gait / Transfers Assistance Needed: Reports able to ambulate with Rollator and enjoys walking outside ADL's / Homemaking Assistance Needed: Pt reports Modified Independence with ADLs, has assist with med mgmt from daughter per notes.   Comments: Unsure of accuracy as pt poor historian        OT Problem List: Decreased strength;Decreased activity tolerance;Impaired balance (sitting and/or standing);Decreased cognition;Decreased safety awareness;Decreased knowledge of use of DME or AE;Cardiopulmonary status limiting activity      OT Treatment/Interventions: Self-care/ADL training;Therapeutic exercise;Energy conservation;DME and/or AE instruction;Therapeutic activities;Patient/family education;Balance training    OT Goals(Current goals can be found in the care plan section) Acute Rehab OT Goals Patient Stated Goal: drink this coffee OT Goal Formulation: Patient unable to participate in goal setting Time For Goal Achievement: 09/16/20 Potential to Achieve Goals: Good ADL Goals Pt Will Perform Grooming: with supervision;standing Pt Will Perform Lower Body Dressing: with supervision;sit to/from stand;sitting/lateral leans Pt Will Transfer to Toilet: with supervision;ambulating Pt Will Perform Toileting - Clothing Manipulation and hygiene: with supervision;sitting/lateral leans;sit to/from stand Additional ADL Goal #1: Pt to demonstrate ability to attend to functional task > 5 minutes Additional ADL Goal #2: Pt to be A&Ox3 with use of environmental cues.  OT Frequency: Min 2X/week   Barriers  to D/C:             Co-evaluation              AM-PAC OT "6 Clicks" Daily Activity     Outcome Measure Help from another person eating meals?: A Little Help from another person taking care of personal grooming?: A Little Help from another person toileting, which includes using toliet, bedpan, or urinal?: A Lot Help from another person bathing (including washing, rinsing, drying)?: A Lot Help from another person to put on and taking off regular upper body clothing?: A Little Help from another person to put on and taking off regular lower body clothing?: A Lot 6 Click Score: 15   End of Session Equipment Utilized During Treatment: Gait belt;Rolling walker;Oxygen Nurse Communication: Mobility status  Activity Tolerance: Patient tolerated treatment well Patient left: in bed;with call bell/phone within reach;with bed alarm set;with nursing/sitter in room  OT Visit Diagnosis: Unsteadiness on feet (R26.81);Other abnormalities of gait and mobility (R26.89);Muscle weakness (generalized) (M62.81);Other symptoms and signs involving cognitive function                Time: 8756-4332 OT Time Calculation (min): 20 min Charges:  OT General Charges $OT Visit: 1 Visit OT Evaluation $OT Eval Moderate Complexity: 1 Mod  Malachy Chamber, OTR/L Acute Rehab Services Office: [phone]  Layla Maw 09/02/2020, 10:14 AM

## 2020-09-02 NOTE — Progress Notes (Signed)
Bladder scan @ 1600 showed 275 mls of urine, got patient up to bsc to attempt to void and she was able to empty 475 mls of clear yellow urine.

## 2020-09-02 NOTE — Progress Notes (Addendum)
HD#1 Subjective:  Overnight Events: no acute event  Evaluated at bedside. States SOB is improving today. Oriented to person, place, and year.Reports bialteral lower abdominal soreness worse on the left. States her right leg is thicker than the other, concerned that this is not right.    RN reports she has been intermittently confused and pulling out lines.   Objective:  Vital signs in last 24 hours: Vitals:   09/01/20 2145 09/01/20 2200 09/01/20 2244 09/02/20 0454  BP:   (!) 101/43 112/65  Pulse: 80 89 70 68  Resp: 15 19 20 18   Temp:   98.4 F (36.9 C) 98.6 F (37 C)  TempSrc:   Oral Oral  SpO2: 94% 94% 98% 98%  Weight:      Height:       Supplemental O2: Nasal Cannula SpO2: 98 % O2 Flow Rate (L/min): 4 L/min   Physical Exam:  Physical Exam Constitutional:      General: She is not in acute distress.    Appearance: She is diaphoretic.     Comments: Alert, oriented to person time and place  HENT:     Head: Normocephalic.  Eyes:     General:        Right eye: No discharge.        Left eye: No discharge.  Cardiovascular:     Rate and Rhythm: Normal rate and regular rhythm.  Pulmonary:     Effort: Pulmonary effort is normal. No respiratory distress.  Abdominal:     General: Bowel sounds are normal.     Tenderness: There is abdominal tenderness (lower abdominal tenderness).  Musculoskeletal:     Cervical back: Normal range of motion.     Right lower leg: No edema.     Left lower leg: No edema.  Skin:    General: Skin is warm.     Comments: Venous stasis dermatosis of bilateral lower legs  Neurological:     Mental Status: She is alert.  Psychiatric:        Mood and Affect: Mood normal.     Filed Weights   09/01/20 1309  Weight: 76.2 kg     Intake/Output Summary (Last 24 hours) at 09/02/2020 0713 Last data filed at 09/01/2020 2106 Gross per 24 hour  Intake --  Output 500 ml  Net -500 ml   Net IO Since Admission: -500 mL [09/02/20  0713]  Pertinent Labs: CBC Latest Ref Rng & Units 09/02/2020 09/01/2020 09/01/2020  WBC 4.0 - 10.5 K/uL 14.8(H) 11.2(H) -  Hemoglobin 12.0 - 15.0 g/dL 11.1(L) 10.9(L) 9.9(L)  Hematocrit 36.0 - 46.0 % 34.7(L) 33.4(L) 29.0(L)  Platelets 150 - 400 K/uL 90(L) 97(L) -    CMP Latest Ref Rng & Units 09/02/2020 09/01/2020 09/01/2020  Glucose 70 - 99 mg/dL 65(L) - 97  BUN 8 - 23 mg/dL 23 - 27(H)  Creatinine 0.44 - 1.00 mg/dL 1.28(H) - 1.61(H)  Sodium 135 - 145 mmol/L 141 141 141  Potassium 3.5 - 5.1 mmol/L 3.9 3.8 3.7  Chloride 98 - 111 mmol/L 110 - 106  CO2 22 - 32 mmol/L 20(L) - 21(L)  Calcium 8.9 - 10.3 mg/dL 9.3 - 9.4  Total Protein 6.5 - 8.1 g/dL - - 6.0(L)  Total Bilirubin 0.3 - 1.2 mg/dL - - 0.3  Alkaline Phos 38 - 126 U/L - - 54  AST 15 - 41 U/L - - 19  ALT 0 - 44 U/L - - 7    Imaging: DG Thoracic  Spine 2 View  Result Date: 09/01/2020 CLINICAL DATA:  [place] EXAM: THORACIC SPINE 2 VIEWS COMPARISON:  04/03/2018 FINDINGS: Frontal and lateral views of the thoracic spine demonstrate rotatory scoliosis, with significant left convex component at the thoracolumbar junction. There are no acute displaced fractures. Extensive multilevel thoracic spondylosis most pronounced from approximately T4 through T9. Paraspinal soft tissues are unremarkable. Lungs are clear. IMPRESSION: 1. Rotatory scoliosis and multilevel thoracic spondylosis. No acute fracture. Electronically Signed   By: Randa Ngo M.D.   On: 09/01/2020 16:05   DG Lumbar Spine Complete  Result Date: 09/01/2020 CLINICAL DATA:  [place] EXAM: LUMBAR SPINE - COMPLETE 4+ VIEW COMPARISON:  09/11/2013 FINDINGS: Frontal, bilateral oblique, lateral views of the lumbar spine are obtained. 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 Stable right convex scoliosis centered at L3/L4. Otherwise alignment is anatomic. No acute fractures. There is significant multilevel spondylosis greatest at L2-3, L4-5, and  L5-S1. Mild diffuse facet hypertrophy. Sacroiliac joints are normal. Vascular stents are seen within the iliac distributions. IMPRESSION: 1. Progressive spondylosis and facet hypertrophy, greatest at L2-3, L4-5 common L5-S1. 2. No acute displaced fracture. Electronically Signed   By: Randa Ngo M.D.   On: 09/01/2020 16:04   CT Head Wo Contrast  Result Date: 09/01/2020 CLINICAL DATA:  Mental status change.  Hypoxia and dysuria. EXAM: CT HEAD WITHOUT CONTRAST CT CERVICAL SPINE WITHOUT CONTRAST TECHNIQUE: Multidetector CT imaging of the head and cervical spine was performed following the standard protocol without intravenous contrast. Multiplanar CT image reconstructions of the cervical spine were also generated. COMPARISON:  09/06/2019 FINDINGS: CT HEAD FINDINGS Brain: No evidence of acute infarction, hemorrhage, hydrocephalus, extra-axial collection or mass lesion/mass effect. There is mild diffuse low-attenuation within the subcortical and periventricular white matter compatible with chronic microvascular disease. Prominence of the sulci and ventricles are noted compatible with brain atrophy. Vascular: No hyperdense vessel or unexpected calcification. Skull: Normal. Negative for fracture or focal lesion. Sinuses/Orbits: The paranasal sinuses and mastoid air cells are clear. Other: None. CT CERVICAL SPINE FINDINGS Alignment: Normal. Skull base and vertebrae: The cervical vertebral body heights are well preserved. No fractures identified. Soft tissues and spinal canal: No prevertebral fluid or swelling. No visible canal hematoma. Disc levels: Status post ACDF of C4 through C6. Solid fusion of the C4-5 and C5-6 disc spaces noted. Moderate degenerative changes are noted at the C3-4 disc space and C6-7. Upper chest: Scattered ground-glass densities are identified bilaterally. Decreased AP diameter of the trachea is identified compatible with tracheo malacia, image 83/6. Other: None IMPRESSION: 1. No acute  intracranial abnormalities. 2. Chronic small vessel ischemic change and brain atrophy. 3. No evidence for cervical spine fracture or dislocation. 4. Status post ACDF of C4 through C6 with solid fusion of the C4-5 and C5-6 disc spaces. 5. Scattered ground-glass densities are identified bilaterally within the upper lobes. Findings are nonspecific but may reflect underlying inflammation. 6. Tracheobronchomalacia. Electronically Signed   By: Kerby Moors M.D.   On: 09/01/2020 16:25   CT Cervical Spine Wo Contrast  Result Date: 09/01/2020 CLINICAL DATA:  Mental status change.  Hypoxia and dysuria. EXAM: CT HEAD WITHOUT CONTRAST CT CERVICAL SPINE WITHOUT CONTRAST TECHNIQUE: Multidetector CT imaging of the head and cervical spine was performed following the standard protocol without intravenous contrast. Multiplanar CT image reconstructions of the cervical spine were also generated. COMPARISON:  09/06/2019 FINDINGS: CT HEAD FINDINGS Brain: No evidence of acute infarction, hemorrhage, hydrocephalus, extra-axial collection or mass lesion/mass effect. There  is mild diffuse low-attenuation within the subcortical and periventricular white matter compatible with chronic microvascular disease. Prominence of the sulci and ventricles are noted compatible with brain atrophy. Vascular: No hyperdense vessel or unexpected calcification. Skull: Normal. Negative for fracture or focal lesion. Sinuses/Orbits: The paranasal sinuses and mastoid air cells are clear. Other: None. CT CERVICAL SPINE FINDINGS Alignment: Normal. Skull base and vertebrae: The cervical vertebral body heights are well preserved. No fractures identified. Soft tissues and spinal canal: No prevertebral fluid or swelling. No visible canal hematoma. Disc levels: Status post ACDF of C4 through C6. Solid fusion of the C4-5 and C5-6 disc spaces noted. Moderate degenerative changes are noted at the C3-4 disc space and C6-7. Upper chest: Scattered ground-glass densities  are identified bilaterally. Decreased AP diameter of the trachea is identified compatible with tracheo malacia, image 83/6. Other: None IMPRESSION: 1. No acute intracranial abnormalities. 2. Chronic small vessel ischemic change and brain atrophy. 3. No evidence for cervical spine fracture or dislocation. 4. Status post ACDF of C4 through C6 with solid fusion of the C4-5 and C5-6 disc spaces. 5. Scattered ground-glass densities are identified bilaterally within the upper lobes. Findings are nonspecific but may reflect underlying inflammation. 6. Tracheobronchomalacia. Electronically Signed   By: Kerby Moors M.D.   On: 09/01/2020 16:25   DG Chest Port 1 View  Result Date: 09/01/2020 CLINICAL DATA:  Questionable sepsis. EXAM: PORTABLE CHEST 1 VIEW COMPARISON:  04/03/2018 FINDINGS: Heart size is normal. Aortic atherosclerosis. Interstitial and airspace densities within the right midlung and right base identified compatible with pneumonia. Mild platelike atelectasis noted in the left base. Thoracolumbar scoliosis with degenerative disc disease. Status post ACDF. IMPRESSION: 1. Right midlung and right base opacities compatible with pneumonia. Followup PA and lateral chest X-ray is recommended in 3-4 weeks following trial of antibiotic therapy to ensure resolution and exclude underlying malignancy. 2. Aortic atherosclerosis. Electronically Signed   By: Kerby Moors M.D.   On: 09/01/2020 14:19    Assessment/Plan:   Active Problems:   Sepsis with acute hypoxic respiratory failure [facility])   Patient Summary:  [name]. Haynie is an 65 yoF with a medical history significant for chronic kidney disease, congenital solitary kidney, PVD, hypertension, HLD presenting to the emergency department for altered mental status.  Found to be septic secondary to a right lower lobe pneumonia.   Sepsis with acute hypoxic respiratory failure Altered mental status Patient presented with altered mental status found to be  septic secondary to pneumonia. Mental status improve slightly today. Patient appears more energetic.   Continue treatment for possible CAP with Vancomycin. Will also check MRSA nasal swab. Will d/c Cefepime and start Ceftriaxone and Azithromycin for CAP.  Blood culture grew gram positive cocci in clusters in aerobic bottle only, will follow. UA is bland, no concern for UTI.   -Give an additional 500 cc LR bolus given soft blood pressure. -Continue O2 supplement, titrate down if able -Continue Vancomycin, Ceftriaxone and Azithromycin  -Pending MRSA screen -Pending blood culture    Macrocytic anemia B12 and Folate normal in 2019. Ferritin 11, and iron sat 11.  -Recheck B12, MMA and Folate.   Thrombocytopenia Likely due to on infection. Will check Path smear and CBC w diff.  -Hold Lovenox for now.  -Continue ASA, plavix and start SCD.   Acute urinary retention Foley was placed due to multiple high readings of bladder scan. Unknown etiology at this time. No concern for UTI given bland UA. Her Amitriptyline was held on  admission.  -Will attempt voiding trial when appropriate    AKI Likely pre-renal. Serum creatine improved from 1.6 to 1.2 after fluid hydration.  -BMP in AM -Hold Lasix   Hypertension Holding home medications amlodipine 5 mg, furosemide 20 mg, and metoprolol tartrate 12.5 mg twice daily in the setting of sepsis    Depression -Continue buspirone 15 mg 3 times daily -Cont divalproex 125 mg twice daily.  -Hold amitriptyline 5 mg in the setting of AMS.      PVD Bilateral lower extremities demonstrate chronic venous stasis.   -Continue ASA and Plavix     GERD Continue pantoprazole 40 mg daily    Lumbar radiculopathy Hold home medications gabapentin 100 mg 3 times daily, pregabalin 25 mg 3 times daily and tramadol 50 mg 3 times daily as needed, in the setting of altered mental status.    Diet: Heart healthy IV fluids: LR CODE STATUS: DNR  Dispo:  Anticipated discharge to Skilled nursing facility in 3 days pending medical management.   Gaylan Gerold, DO 09/02/2020, 7:13 AM Pager: [phone]  Please contact the on call pager after 5 pm and on weekends at [phone].

## 2020-09-02 NOTE — Progress Notes (Signed)
PHARMACY - PHYSICIAN COMMUNICATION CRITICAL VALUE ALERT - BLOOD CULTURE IDENTIFICATION (BCID)  [name] [name] is an 80 y.o. female who presented to [facility] on 09/01/2020 with a chief complaint of AMS.   Assessment:  1o4 (aerobic) bottles growing gram positive cocci in clusters. BCID showing staph species with no resistance noted.   Name of physician (or Provider) Contacted: Alfonse Spruce MD  Current antibiotics: Cefepime and Vancomycin  Changes to prescribed antibiotics recommended:  Spoke with MD, will continue vancomycin at this time until MRSA PCR results return. This will consider discontinuing vancomycin.   Results for orders placed or performed during the hospital encounter of 09/01/20  Blood Culture ID Panel (Reflexed) (Collected: 09/01/2020  1:32 PM)  Result Value Ref Range   Enterococcus faecalis NOT DETECTED NOT DETECTED   Enterococcus Faecium NOT DETECTED NOT DETECTED   Listeria monocytogenes NOT DETECTED NOT DETECTED   Staphylococcus species DETECTED (A) NOT DETECTED   Staphylococcus aureus (BCID) NOT DETECTED NOT DETECTED   Staphylococcus epidermidis NOT DETECTED NOT DETECTED   Staphylococcus lugdunensis NOT DETECTED NOT DETECTED   Streptococcus species NOT DETECTED NOT DETECTED   Streptococcus agalactiae NOT DETECTED NOT DETECTED   Streptococcus pneumoniae NOT DETECTED NOT DETECTED   Streptococcus pyogenes NOT DETECTED NOT DETECTED   A.calcoaceticus-baumannii NOT DETECTED NOT DETECTED   Bacteroides fragilis NOT DETECTED NOT DETECTED   Enterobacterales NOT DETECTED NOT DETECTED   Enterobacter cloacae complex NOT DETECTED NOT DETECTED   Escherichia coli NOT DETECTED NOT DETECTED   Klebsiella aerogenes NOT DETECTED NOT DETECTED   Klebsiella oxytoca NOT DETECTED NOT DETECTED   Klebsiella pneumoniae NOT DETECTED NOT DETECTED   Proteus species NOT DETECTED NOT DETECTED   Salmonella species NOT DETECTED NOT DETECTED   Serratia marcescens NOT DETECTED NOT DETECTED   Haemophilus  influenzae NOT DETECTED NOT DETECTED   Neisseria meningitidis NOT DETECTED NOT DETECTED   Pseudomonas aeruginosa NOT DETECTED NOT DETECTED   Stenotrophomonas maltophilia NOT DETECTED NOT DETECTED   Candida albicans NOT DETECTED NOT DETECTED   Candida auris NOT DETECTED NOT DETECTED   Candida glabrata NOT DETECTED NOT DETECTED   Candida krusei NOT DETECTED NOT DETECTED   Candida parapsilosis NOT DETECTED NOT DETECTED   Candida tropicalis NOT DETECTED NOT DETECTED   Cryptococcus neoformans/gattii NOT DETECTED NOT DETECTED    Cephus Slater, PharmD, MBA Pharmacy Resident [phone] 09/02/2020 1:13 PM

## 2020-09-03 DIAGNOSIS — M5416 Radiculopathy, lumbar region: Secondary | ICD-10-CM | POA: Diagnosis not present

## 2020-09-03 DIAGNOSIS — F32A Depression, unspecified: Secondary | ICD-10-CM | POA: Diagnosis not present

## 2020-09-03 DIAGNOSIS — J189 Pneumonia, unspecified organism: Secondary | ICD-10-CM | POA: Diagnosis not present

## 2020-09-03 DIAGNOSIS — I10 Essential (primary) hypertension: Secondary | ICD-10-CM | POA: Diagnosis not present

## 2020-09-03 LAB — BASIC METABOLIC PANEL
Anion gap: 13 (ref 5–15)
BUN: 13 mg/dL (ref 8–23)
CO2: 24 mmol/L (ref 22–32)
Calcium: 8.9 mg/dL (ref 8.9–10.3)
Chloride: 109 mmol/L (ref 98–111)
Creatinine, Ser: 1.06 mg/dL — ABNORMAL HIGH (ref 0.44–1.00)
GFR, Estimated: 53 mL/min — ABNORMAL LOW (ref >60)
Glucose, Bld: 77 mg/dL (ref 70–99)
Potassium: 3.4 mmol/L — ABNORMAL LOW (ref 3.5–5.1)
Sodium: 146 mmol/L — ABNORMAL HIGH (ref 135–145)

## 2020-09-03 LAB — CBC WITH DIFFERENTIAL/PLATELET
Abs Immature Granulocytes: 0.12 10*3/uL — ABNORMAL HIGH (ref 0.00–0.07)
Basophils Absolute: 0 10*3/uL (ref 0.0–0.1)
Basophils Relative: 0 %
Eosinophils Absolute: 0.2 10*3/uL (ref 0.0–0.5)
Eosinophils Relative: 2 %
HCT: 29.8 % — ABNORMAL LOW (ref 36.0–46.0)
Hemoglobin: 10.2 g/dL — ABNORMAL LOW (ref 12.0–15.0)
Immature Granulocytes: 1 %
Lymphocytes Relative: 9 %
Lymphs Abs: 1 10*3/uL (ref 0.7–4.0)
MCH: 35.7 pg — ABNORMAL HIGH (ref 26.0–34.0)
MCHC: 34.2 g/dL (ref 30.0–36.0)
MCV: 104.2 fL — ABNORMAL HIGH (ref 80.0–100.0)
Monocytes Absolute: 0.8 10*3/uL (ref 0.1–1.0)
Monocytes Relative: 6 %
Neutro Abs: 9.7 10*3/uL — ABNORMAL HIGH (ref 1.7–7.7)
Neutrophils Relative %: 82 %
Platelets: 104 10*3/uL — ABNORMAL LOW (ref 150–400)
RBC: 2.86 MIL/uL — ABNORMAL LOW (ref 3.87–5.11)
RDW: 14.4 % (ref 11.5–15.5)
WBC: 11.8 10*3/uL — ABNORMAL HIGH (ref 4.0–10.5)
nRBC: 0 % (ref 0.0–0.2)

## 2020-09-03 LAB — CULTURE, BLOOD (ROUTINE X 2): Special Requests: ADEQUATE

## 2020-09-03 LAB — URINE CULTURE: Culture: NO GROWTH

## 2020-09-03 LAB — IRON AND TIBC
Iron: 26 ug/dL — ABNORMAL LOW (ref 28–170)
Saturation Ratios: 12 % (ref 10.4–31.8)
TIBC: 210 ug/dL — ABNORMAL LOW (ref 250–450)
UIBC: 184 ug/dL

## 2020-09-03 LAB — PATHOLOGIST SMEAR REVIEW: Path Review: INCREASED

## 2020-09-03 LAB — MRSA PCR SCREENING: MRSA by PCR: POSITIVE — AB

## 2020-09-03 LAB — GLUCOSE, CAPILLARY: Glucose-Capillary: 79 mg/dL (ref 70–99)

## 2020-09-03 LAB — FERRITIN: Ferritin: 99 ng/mL (ref 11–307)

## 2020-09-03 MED ORDER — POTASSIUM CHLORIDE 20 MEQ PO PACK
40.0000 meq | PACK | Freq: Two times a day (BID) | ORAL | Status: AC
  Administered 2020-09-03 – 2020-09-05 (×6): 40 meq via ORAL
  Filled 2020-09-03 (×6): qty 2

## 2020-09-03 MED ORDER — VANCOMYCIN HCL 750 MG/150ML IV SOLN
750.0000 mg | INTRAVENOUS | Status: DC
  Administered 2020-09-03 – 2020-09-05 (×3): 750 mg via INTRAVENOUS
  Filled 2020-09-03 (×3): qty 150

## 2020-09-03 MED ORDER — SODIUM CHLORIDE 0.9 % IV SOLN
1.0000 g | INTRAVENOUS | Status: DC
  Administered 2020-09-03: 1 g via INTRAVENOUS
  Filled 2020-09-03: qty 1
  Filled 2020-09-03: qty 10

## 2020-09-03 MED ORDER — ENOXAPARIN SODIUM 40 MG/0.4ML ~~LOC~~ SOLN
40.0000 mg | SUBCUTANEOUS | Status: DC
  Administered 2020-09-03 – 2020-09-07 (×5): 40 mg via SUBCUTANEOUS
  Filled 2020-09-03 (×5): qty 0.4

## 2020-09-03 NOTE — Progress Notes (Signed)
Pharmacy Antibiotic Note  [name] [name] is a 80 y.o. female admitted on 09/01/2020 with sepsis.  Pharmacy has been consulted for Vancomycin and Cefepime dosing.   Improving renal function - Scr down to 1.06 (baseline ~0.9), CrCl 38 ml/min.   Plan: Change Vancomycin to 750 mg IV q24 hrs (AUC 548, Scr 1.06, Vd 0.5) Monitor renal function, cultures/sensitivities, clinical progression  Height: 5' (152.4 cm) Weight: 76.2 kg (167 lb 15.9 oz) IBW/kg (Calculated) : 45.5  Temp (24hrs), Avg:98.1 F (36.7 C), Min:97.2 F (36.2 C), Max:98.8 F (37.1 C)  Recent Labs  Lab 09/01/20 1326 09/01/20 1500 09/01/20 1700 09/02/20 0419 09/03/20 0301  WBC 10.7*  --  11.2* 14.8* 11.8*  CREATININE 1.61*  --   --  1.28* 1.06*  LATICACIDVEN 0.9 1.4  --   --   --     Estimated Creatinine Clearance: 38 mL/min (A) (by C-G formula based on SCr of 1.06 mg/dL (H)).    Allergies  Allergen Reactions  . Gabapentin   . Statins   . Gabapentin Itching    Was deleted since patient was taking, but now experiencing itching again.   . Statins Swelling, Rash and Other (See Comments)    Swelling involving tongue; also causes muscle pain    Antimicrobials this admission: Vancomycin 2/23 >>  Cefepime 2/23 >> 2/24 Azithro 2/24>> Rocephin 2/24>>  Dose adjustments this admission: N/A  Microbiology results: 2/23 BCx: GPCs in 1 out of 4 - staph species, no resistance 2/23 UCx: pend   Alanda Slim, PharmD, [facility] Clinical Pharmacist Please see AMION for all Pharmacists' Contact Phone Numbers 09/03/2020, 8:10 AM

## 2020-09-03 NOTE — Progress Notes (Signed)
Physical Therapy Treatment Patient Details Name: [name] [name] MRN: [id number] DOB: 02/07/1941 Today's Date: 09/03/2020    History of Present Illness [name] is an 89 yoF with a medical history significant for chronic kidney disease, congenital solitary kidney, PVD, hypertension, HLD presenting to the emergency department for altered mental status.    PT Comments    Pt in bed on arrival, she had removed her [place] and SpO2 was 85% on RA. 3.5L O2 via [place] replaced and SpO2 improved to 92%. Pt demonstrating increased confusion, continually requiring reorientation and redirection. She attempted to eat her blood pressure cuff (which was laying on bedside table) as well as attempting to have conversation with the TV. She required min assist bed mobility, min assist sit to stand, and min assist SPT with RW bed to recliner. Pt positioned in recliner with feet elevated, chair alarm in place and ativated. SpO2 94% in recliner on 3.5L.    Follow Up Recommendations  SNF     Equipment Recommendations  None recommended by PT    Recommendations for Other Services       Precautions / Restrictions Precautions Precautions: Fall Restrictions Weight Bearing Restrictions: No    Mobility  Bed Mobility Overal bed mobility: Needs Assistance Bed Mobility: Supine to Sit     Supine to sit: Min assist;HOB elevated     General bed mobility comments: +rail, decreased initiation, increased time, assist to elevate trunk    Transfers Overall transfer level: Needs assistance Equipment used: Rolling walker (2 wheeled) Transfers: Sit to/from [facility] Sit to Stand: Min assist Stand pivot transfers: Min assist       General transfer comment: cues for hand placement, assist to power up and stabilize balance, pivot steps with RW bed to recliner  Ambulation/Gait             General Gait Details: unable to safely progress due to increased confusion   Stairs              Wheelchair Mobility    Modified Rankin (Stroke Patients Only)       Balance Overall balance assessment: Needs assistance Sitting-balance support: No upper extremity supported;Feet supported Sitting balance-Leahy Scale: Fair     Standing balance support: Bilateral upper extremity supported;During functional activity Standing balance-Leahy Scale: Poor Standing balance comment: reliant on B UE support                            Cognition Arousal/Alertness: Awake/alert Behavior During Therapy: Restless;Impulsive Overall Cognitive Status: Impaired/Different from baseline Area of Impairment: Orientation;Attention;Following commands;Memory;Safety/judgement;Awareness;Problem solving                 Orientation Level: Disoriented to;Time;Situation;Place Current Attention Level: Focused Memory: Decreased short-term memory Following Commands: Follows one step commands inconsistently;Follows one step commands with increased time Safety/Judgement: Decreased awareness of safety;Decreased awareness of deficits Awareness: Intellectual Problem Solving: Difficulty sequencing;Requires verbal cues;Requires tactile cues;Decreased initiation General Comments: Increased confusion noted today. Pt trying to talk to TV and attempted to eat her blood pressure cuff which was laying on bedside table. Very restless. Requiring continuous redirection and orientation to place and situation.      Exercises      General Comments General comments (skin integrity, edema, etc.): On arrival, pt has removed her O2 with SpO2 85% on RA. 3.5L O2 replaced. SpO2 remained > 90% during session with O2 in place.      Pertinent Vitals/Pain Pain  Assessment: Faces Faces Pain Scale: Hurts little more Pain Location: back Pain Descriptors / Indicators: Discomfort;Grimacing Pain Intervention(s): Monitored during session;Repositioned    Home Living                      Prior Function             PT Goals (current goals can now be found in the care plan section) Progress towards PT goals: Not progressing toward goals - comment (increased confusion)    Frequency    Min 3X/week      PT Plan Current plan remains appropriate    Co-evaluation              AM-PAC PT "6 Clicks" Mobility   Outcome Measure  Help needed turning from your back to your side while in a flat bed without using bedrails?: None Help needed moving from lying on your back to sitting on the side of a flat bed without using bedrails?: A Little Help needed moving to and from a bed to a chair (including a wheelchair)?: A Little Help needed standing up from a chair using your arms (e.g., wheelchair or bedside chair)?: A Little Help needed to walk in hospital room?: A Lot Help needed climbing 3-5 steps with a railing? : A Lot 6 Click Score: 17    End of Session Equipment Utilized During Treatment: Gait belt;Oxygen Activity Tolerance: Other (comment) (confusion) Patient left: in chair;with call bell/phone within reach;with chair alarm set Nurse Communication: Mobility status PT Visit Diagnosis: Unsteadiness on feet (R26.81);Muscle weakness (generalized) (M62.81)     Time: 4854-6270 PT Time Calculation (min) (ACUTE ONLY): 24 min  Charges:  $Gait Training: 8-22 mins $Therapeutic Activity: 8-22 mins                     Lorrin Goodell, PT  Office # [phone] Pager [phone]    Lorriane Shire 09/03/2020, 11:54 AM

## 2020-09-03 NOTE — Progress Notes (Addendum)
HD#2 Subjective:  Overnight Events: patient is able to void yesterday  Patient is seen at bedside. Patient laying bed crying out for her daughter. Concerned she does not have her personal belongings. Reassured they personal items are in the cabinet and will call her daughter.  States she is not feeling too good. Would like to try to urinate on her own and get out of her bed. Endorses SOB and pain of her sides.   I called and spoke to patient's daughter, [name] [name].  She states that she is inside patient's room currently.  She states that patient is more confused than normal that requires multiple attempts at reorientation.  [name] [name] states that patient often refuses help at home and she currently living alone.  Objective:  Vital signs in last 24 hours: Vitals:   09/02/20 0800 09/02/20 1146 09/02/20 2130 09/03/20 0558  BP:  (!) 115/55 (!) 115/52 (!) 127/57  Pulse:  73 75 72  Resp: 20 20 17 20   Temp:  (!) 97.2 F (36.2 C) 98.3 F (36.8 C) 98.8 F (37.1 C)  TempSrc:      SpO2:  96%  100%  Weight:      Height:       Supplemental O2: Room Air SpO2: 100 % O2 Flow Rate (L/min): 5 L/min   Physical Exam:  Physical Exam Constitutional:      General: She is in acute distress.  Eyes:     General:        Right eye: No discharge.        Left eye: No discharge.  Cardiovascular:     Rate and Rhythm: Normal rate and regular rhythm.  Pulmonary:     Effort: Pulmonary effort is normal. No respiratory distress.  Abdominal:     General: Bowel sounds are normal.     Tenderness: There is abdominal tenderness (lower abdomen).  Musculoskeletal:     Right lower leg: No edema.     Left lower leg: No edema.  Skin:    General: Skin is warm.  Neurological:     Mental Status: She is alert.     Filed Weights   09/01/20 1309  Weight: 76.2 kg     Intake/Output Summary (Last 24 hours) at 09/03/2020 0622 Last data filed at 09/03/2020 0300 Gross per 24 hour  Intake 500 ml  Output 700  ml  Net -200 ml   Net IO Since Admission: -700 mL [09/03/20 0622]  Pertinent Labs: CBC Latest Ref Rng & Units 09/03/2020 09/02/2020 09/01/2020  WBC 4.0 - 10.5 K/uL 11.8(H) 14.8(H) 11.2(H)  Hemoglobin 12.0 - 15.0 g/dL 10.2(L) 11.1(L) 10.9(L)  Hematocrit 36.0 - 46.0 % 29.8(L) 34.7(L) 33.4(L)  Platelets 150 - 400 K/uL 104(L) 90(L) 97(L)    CMP Latest Ref Rng & Units 09/03/2020 09/02/2020 09/01/2020  Glucose 70 - 99 mg/dL 77 65(L) -  BUN 8 - 23 mg/dL 13 23 -  Creatinine 0.44 - 1.00 mg/dL 1.06(H) 1.28(H) -  Sodium 135 - 145 mmol/L 146(H) 141 141  Potassium 3.5 - 5.1 mmol/L 3.4(L) 3.9 3.8  Chloride 98 - 111 mmol/L 109 110 -  CO2 22 - 32 mmol/L 24 20(L) -  Calcium 8.9 - 10.3 mg/dL 8.9 9.3 -  Total Protein 6.5 - 8.1 g/dL - - -  Total Bilirubin 0.3 - 1.2 mg/dL - - -  Alkaline Phos 38 - 126 U/L - - -  AST 15 - 41 U/L - - -  ALT 0 - 44 U/L - - -  Imaging: No results found.  Assessment/Plan:   Principal Problem:   Pneumonia of right lower lobe due to infectious organism Active Problems:   Macrocytic anemia   CKD (chronic kidney disease) stage 3, GFR 30-59 ml/min (HCC)   Acute kidney injury superimposed on CKD ([place])   Sepsis with acute hypoxic respiratory failure ([place])   Thrombocytopenia ([place])  [name] [name] is an 40 yoF with a medical history significant forchronic kidney disease, congenital solitary kidney, PVD, hypertension, HLD presenting to the emergency department for altered mental status.Found to be septic secondary to a right lower lobe pneumonia.   Sepsis with acute hypoxic respiratory failure Altered mental status Patient presented with altered mental status found to be septic secondary to pneumonia. Patient remains confused today, confirmed with patient's daughter.  We will continue CAP treatment with vancomycin, ceftriaxone and azithromycin.  Her MRSA screen came back positive.  If she improves, we will transition to Doxycycline and Cefdinir.  1/4 bottle grew  staph hominis, likely a contaminant. UA is bland and urine culture show no growth.  No concern for UTI -Continue O2 supplement, titrate down if able -Continue Vancomycin, Ceftriaxone and Azithromycin  -Blood culture remains negative    Macrocytic anemia B12 and Folate within normal limit l Iron sat 12%, TIBC 210 with ferritin 99, suggest anemia of chronic disease -Pending MMA -AM CBC   Thrombocytopenia Likely due to on infection. Improved today.  Path smear confirmed macrocytic anemia, neutrophilia w band, and thrombocytopenia.  -Continue ASA, plavix     AKI - improving Likely pre-renal. Serum creatine improved from 1.2 to 1.06 -BMP in AM -Hold Lasix   Hypertension Holding home medications amlodipine 5 mg, furosemide 20 mg, and metoprolol tartrate 12.5mg  twice daily in the setting of sepsis   Depression -Continue buspirone 15 mg 3 times daily -Cont divalproex 125 mg twice daily.  -Hold amitriptyline 5 mgin the setting of AMS.    PVD Bilateral lower extremities demonstrate chronic venous stasis.  -Continue ASA and Plavix    GERD Continue pantoprazole 40 mg daily   Lumbar radiculopathy Hold home medications gabapentin 100 mg 3 times daily, pregabalin 25 mg 3 times daily and tramadol 50 mg 3 times daily as needed, in the setting of altered mental status.   Diet: Heart healthy IV fluids: LR CODE STATUS: DNR  Dispo: Anticipated discharge to Skilled nursing facility in 3 days pending medical management.   Gaylan Gerold, DO 09/03/2020, 6:22 AM Pager: [phone]  Please contact the on call pager after 5 pm and on weekends at [phone].

## 2020-09-03 NOTE — Progress Notes (Signed)
[facility] called stating patient is positive for MRSA in the nares. Dr. Rebeca Alert has been notified.

## 2020-09-04 DIAGNOSIS — J189 Pneumonia, unspecified organism: Secondary | ICD-10-CM | POA: Diagnosis not present

## 2020-09-04 DIAGNOSIS — F32A Depression, unspecified: Secondary | ICD-10-CM | POA: Diagnosis not present

## 2020-09-04 DIAGNOSIS — M5416 Radiculopathy, lumbar region: Secondary | ICD-10-CM | POA: Diagnosis not present

## 2020-09-04 DIAGNOSIS — I10 Essential (primary) hypertension: Secondary | ICD-10-CM | POA: Diagnosis not present

## 2020-09-04 LAB — CBC
HCT: 32.8 % — ABNORMAL LOW (ref 36.0–46.0)
Hemoglobin: 10.9 g/dL — ABNORMAL LOW (ref 12.0–15.0)
MCH: 34.1 pg — ABNORMAL HIGH (ref 26.0–34.0)
MCHC: 33.2 g/dL (ref 30.0–36.0)
MCV: 102.5 fL — ABNORMAL HIGH (ref 80.0–100.0)
Platelets: 116 10*3/uL — ABNORMAL LOW (ref 150–400)
RBC: 3.2 MIL/uL — ABNORMAL LOW (ref 3.87–5.11)
RDW: 13.8 % (ref 11.5–15.5)
WBC: 10 10*3/uL (ref 4.0–10.5)
nRBC: 0 % (ref 0.0–0.2)

## 2020-09-04 LAB — BASIC METABOLIC PANEL
Anion gap: 10 (ref 5–15)
BUN: 6 mg/dL — ABNORMAL LOW (ref 8–23)
CO2: 26 mmol/L (ref 22–32)
Calcium: 9 mg/dL (ref 8.9–10.3)
Chloride: 108 mmol/L (ref 98–111)
Creatinine, Ser: 0.83 mg/dL (ref 0.44–1.00)
GFR, Estimated: >60 mL/min (ref >60)
Glucose, Bld: 82 mg/dL (ref 70–99)
Potassium: 3.5 mmol/L (ref 3.5–5.1)
Sodium: 144 mmol/L (ref 135–145)

## 2020-09-04 MED ORDER — CEFDINIR 300 MG PO CAPS
300.0000 mg | ORAL_CAPSULE | Freq: Two times a day (BID) | ORAL | Status: AC
  Administered 2020-09-04 – 2020-09-05 (×4): 300 mg via ORAL
  Filled 2020-09-04 (×4): qty 1

## 2020-09-04 MED ORDER — DOXYCYCLINE HYCLATE 100 MG PO TABS
100.0000 mg | ORAL_TABLET | Freq: Two times a day (BID) | ORAL | Status: AC
  Administered 2020-09-04 – 2020-09-05 (×4): 100 mg via ORAL
  Filled 2020-09-04 (×3): qty 1

## 2020-09-04 NOTE — Progress Notes (Signed)
Subjective: Patient seen sleeping comfortably in her bed this morning. States she feels well. She states it has been some time since she has seen her family. Mentation somewhat improved.  Objective:  Vital signs in last 24 hours: Vitals:   09/03/20 0558 09/03/20 1154 09/03/20 2130 09/04/20 0559  BP: (!) 127/57 (!) 163/66 140/61 90/65  Pulse: 72 72 65 (!) 57  Resp: 20 20 20 18   Temp: 98.8 F (37.1 C) 98.6 F (37 C) 98.3 F (36.8 C) 98.2 F (36.8 C)  TempSrc:      SpO2: 100% 98% 93%   Weight:      Height:       Physical Exam Constitutional:      General: She is not in acute distress.    Appearance: She is not ill-appearing or toxic-appearing.  HENT:     Head: Normocephalic and atraumatic.  Eyes:     Extraocular Movements: Extraocular movements intact.     Conjunctiva/sclera: Conjunctivae normal.  Cardiovascular:     Rate and Rhythm: Normal rate and regular rhythm.     Heart sounds: Normal heart sounds. No murmur heard. No friction rub. No gallop.   Pulmonary:     Effort: Pulmonary effort is normal. No respiratory distress.     Breath sounds: Normal breath sounds. No wheezing or rales.  Abdominal:     General: Abdomen is flat. Bowel sounds are normal. There is no distension.     Palpations: Abdomen is soft.     Tenderness: There is no abdominal tenderness.  Musculoskeletal:     Right lower leg: No edema.     Left lower leg: No edema.  Neurological:     Mental Status: She is alert. She is disoriented.     Assessment/Plan:  Principal Problem:   Pneumonia of right lower lobe due to infectious organism Active Problems:   Macrocytic anemia   CKD (chronic kidney disease) stage 3, GFR 30-59 ml/min (HCC)   Acute kidney injury superimposed on CKD ([place])   Sepsis with acute hypoxic respiratory failure ([place])   Thrombocytopenia ([place])  Ms. Carleena L. Grills is an 7 yoF with a medical history significant forchronic kidney disease, congenital solitary kidney, PVD,  hypertension, HLD presenting to the emergency department for altered mental status.Found to be septic secondary to a right lower lobe pneumonia.   Sepsis with acute hypoxic respiratory failure Altered mental status Patient presented with altered mental status found to be septic secondary to pneumonia. Mentation somewhat improved however patient is still not oriented x3.  Transitioned from IV antibiotics to doxycycline and cefdinir today.  Stable on room air.  -Switch IV antibiotics to oral doxycycline and cefdinir -Continue to monitor cultures  Macrocytic anemia Stable.  B12 and Folate within normal limit. Iron sat 12%, TIBC 210 with ferritin 99, suggest anemia of chronic disease. -Pending MMA -AM CBC  Thrombocytopenia Likely due to on infection, continuing to improve.  Path smear confirmed macrocytic anemia, neutrophilia w band, and thrombocytopenia.  -Continue ASA, plavix   AKI -resolved Resolved, creatinine 0.83.  Currently euvolemic, can resume furosemide if needed -Trend BMP  Hypertension Home medications amlodipine 5 mg, furosemide 20 mg, and metoprolol tartrate 12.5mg  twice daily.  Blood pressure on the low side this morning 90/65.  Will continue to hold home medications.  Depression -Continue buspirone 15 mg 3 times daily -Contdivalproex 125 mg twice daily.  -Hold amitriptyline 5 mgin the setting of AMS.   Lumbar radiculopathy Hold home medications gabapentin 100 mg 3 times  daily, pregabalin 25 mg 3 times daily and tramadol 50 mg 3 times daily as needed, in the setting of altered mental status.  Prior to Admission Living Arrangement: Home Anticipated Discharge Location: SNF Barriers to Discharge: SNF placement Dispo: Anticipated discharge pending clinical improvement and SNF placement.  Mike Craze, DO 09/04/2020, 6:25 AM Pager: [phone] After 5pm on weekdays and 1pm on weekends: On Call pager [phone]

## 2020-09-04 NOTE — Social Work (Addendum)
To Whom It May Concern:  Please be advised that the above-named patient will require a short-term nursing home stay - anticipated 30 days or less for rehabilitation and strengthening.  The plan is for return home.  

## 2020-09-04 NOTE — TOC Initial Note (Signed)
Transition of Care [facility]) - Initial/Assessment Note    Patient Details  Name: [name] [name] MRN: [id number] Date of Birth: [birth year]  Transition of Care [facility]) CM/SW Contact:    Emeterio Reeve, [place] Phone Number: 09/04/2020, 12:32 PM  Clinical Narrative:                  CSW spoke to pt daughter [name] [name] via phone. Pt is oriented x2. [name] [name] states pt is from home alone, pts nephew lived with her for a while but has now moved out. [name] [name] states pt uses a walker at home and can complete most ADLs on her own. Pt needs assistance with getting in and out of the shower. Pt is covid vaccinated.   CSW reviewed pt/ot reccs for SNF. [name] [name] states pt has been to multiple SNFs over the years. [facility]. They do not want the referral sent to [facility]. CSW referred [name] [name] to the medicare.gov website to review SNF ratings.   Expected Discharge Plan: Skilled Nursing Facility Barriers to Discharge: Continued Medical Work up   Patient Goals and CMS Choice Patient states their goals for this hospitalization and ongoing recovery are:: get stronger CMS Medicare.gov Compare Post Acute Care list provided to:: Patient Choice offered to / list presented to : Adult Children  Expected Discharge Plan and Services Expected Discharge Plan: [name] arrangements for the past 2 months: Single Family Home                                      Prior Living Arrangements/Services Living arrangements for the past 2 months: Single Family Home Lives with:: Self Patient language and need for interpreter reviewed:: Yes Do you feel safe going back to the place where you live?: Yes      Need for Family Participation in Patient Care: Yes (Comment) Care giver support system in place?: Yes (comment)   Criminal Activity/Legal Involvement Pertinent to Current Situation/Hospitalization: No - Comment as needed  Activities of Daily Living Home Assistive  Devices/Equipment: None ADL Screening (condition at time of admission) Patient's cognitive ability adequate to safely complete daily activities?: No Is the patient deaf or have difficulty hearing?: No Does the patient have difficulty seeing, even when wearing glasses/contacts?: No Does the patient have difficulty concentrating, remembering, or making decisions?: Yes Patient able to express need for assistance with ADLs?: No Does the patient have difficulty dressing or bathing?: Yes Independently performs ADLs?: No Communication: Independent Dressing (OT): Needs assistance Is this a change from baseline?: Change from baseline, expected to last <3days Grooming: Needs assistance Is this a change from baseline?: Change from baseline, expected to last <3 days Feeding: Independent Bathing: Needs assistance Is this a change from baseline?: Change from baseline, expected to last <3 days Toileting: Independent with device (comment) In/Out Bed: Dependent Is this a change from baseline?: Change from baseline, expected to last <3 days Walks in Home: Independent Does the patient have difficulty walking or climbing stairs?: Yes Weakness of Legs: Both Weakness of Arms/Hands: None  Permission Sought/Granted Permission sought to share information with : Family Chief Financial Officer Permission granted to share information with : Yes, Verbal Permission Granted     Permission granted to share info w AGENCY: SNF  Permission granted to share info w Relationship: Daughter     Emotional Assessment Appearance:: Appears stated age  Attitude/Demeanor/Rapport: Unable to Assess Affect (typically observed): Unable to Assess Orientation: : Oriented to Self,Oriented to Place,Oriented to  Time,Oriented to Situation Alcohol / Substance Use: Not Applicable Psych Involvement: No (comment)  Admission diagnosis:  Acute respiratory failure with hypoxia ([place]) [J96.01] Acute kidney injury ([place])  [N17.9] Pneumonia of right lower lobe due to infectious organism [J18.9] Sepsis with acute hypoxic respiratory failure ([place]) [A41.9, R65.20, J96.01] Patient Active Problem List   Diagnosis Date Noted  . Thrombocytopenia ([place]) 09/02/2020  . Sepsis with acute hypoxic respiratory failure ([place]) 09/01/2020  . Ambulatory dysfunction 09/07/2019  . Acute respiratory failure with hypoxia ([place]) 09/06/2019  . Essential hypertension 09/06/2019  . Dyslipidemia 09/06/2019  . CKD (chronic kidney disease) stage 3, GFR 30-59 ml/min (HCC) 09/06/2019  . Tremor 09/06/2019  . Acute kidney injury superimposed on CKD ([place]) 09/06/2019  . Fall at home, initial encounter 09/06/2019  . Homicidal ideation   . Psychosocial stressors 04/03/2018  . Primary open angle glaucoma (POAG) of both eyes, severe stage 12/25/2017  . Polypharmacy 11/11/2017  . Pneumonia of right lower lobe due to infectious organism 11/11/2017  . Congenital absence of one kidney 09/05/2017  . Macrocytic anemia 12/19/2016  . Anemia in chronic kidney disease 09/24/2016  . Stage 3 chronic kidney disease ([place]) 09/24/2016  . Peripheral vascular disease ([place])   . Acrochordon 08/03/2015  . Stasis dermatitis of both legs 01/18/2015  . Radiculopathy of lumbar region 01/06/2015  . Seborrheic keratosis 11/24/2014  . OSA (obstructive sleep apnea) 09/21/2014  . Osteoporosis 06/29/2014  . Thyroid nodule 06/29/2014  . Breast calcifications 06/29/2014  . Glaucoma 01/08/2014  . Basal cell carcinoma of scalp 11/12/2013  . Presbyopia 10/22/2013  . Resting tremor 10/14/2013  . Polymyalgia rheumatica ([place]) 09/11/2013  . GERD (gastroesophageal reflux disease) 09/11/2013  . Major depressive disorder, recurrent episode, severe ([place]) 05/14/2013  . Pulmonary nodules 05/24/2012  . Carotid bruit 04/30/2012  . Essential hypertension 04/27/2012  . Anxiety state 04/27/2012  . Mixed hyperlipidemia 04/27/2012  . Cervical spondylosis with radiculopathy 04/19/2012    PCP:  [name], MD Pharmacy:   [facility] [phone] - [address], [address] AT [facility] OF [address] [place] [address] Phone: [phone] Fax: [facility] #[id number], [place] [address] Phone: [phone] Fax: [phone]     Social Determinants of Health (SDOH) Interventions    Readmission Risk Interventions No flowsheet data found.  Emeterio Reeve, Latanya Presser, [place] Social Worker [phone]

## 2020-09-04 NOTE — NC FL2 (Signed)
[place] MEDICAID FL2 LEVEL OF CARE SCREENING TOOL     IDENTIFICATION  Patient Name: [name] [name] Birthdate: [birth year] Sex: female Admission Date (Current Location): 09/01/2020  [facility] and [place] Number:  Herbalist and Address:  [facility] [place]. [facility], [place] [address], [place], [place] [address]      Provider Number: [id number]  Attending Physician Name and Address:  Oda Kilts, MD  Relative Name and Phone Number:       Current Level of Care: Hospital Recommended Level of Care: [place] Prior Approval Number:    Date Approved/Denied:   PASRR Number: pending  Discharge Plan: SNF    Current Diagnoses: Patient Active Problem List   Diagnosis Date Noted  . Thrombocytopenia ([place]) 09/02/2020  . Sepsis with acute hypoxic respiratory failure ([place]) 09/01/2020  . Ambulatory dysfunction 09/07/2019  . Acute respiratory failure with hypoxia ([place]) 09/06/2019  . Essential hypertension 09/06/2019  . Dyslipidemia 09/06/2019  . CKD (chronic kidney disease) stage 3, GFR 30-59 ml/min (HCC) 09/06/2019  . Tremor 09/06/2019  . Acute kidney injury superimposed on CKD ([place]) 09/06/2019  . Fall at home, initial encounter 09/06/2019  . Homicidal ideation   . Psychosocial stressors 04/03/2018  . Primary open angle glaucoma (POAG) of both eyes, severe stage 12/25/2017  . Polypharmacy 11/11/2017  . Pneumonia of right lower lobe due to infectious organism 11/11/2017  . Congenital absence of one kidney 09/05/2017  . Macrocytic anemia 12/19/2016  . Anemia in chronic kidney disease 09/24/2016  . Stage 3 chronic kidney disease ([place]) 09/24/2016  . Peripheral vascular disease ([place])   . Acrochordon 08/03/2015  . Stasis dermatitis of both legs 01/18/2015  . Radiculopathy of lumbar region 01/06/2015  . Seborrheic keratosis 11/24/2014  . OSA (obstructive sleep apnea) 09/21/2014  . Osteoporosis 06/29/2014  . Thyroid nodule 06/29/2014  . Breast  calcifications 06/29/2014  . Glaucoma 01/08/2014  . Basal cell carcinoma of scalp 11/12/2013  . Presbyopia 10/22/2013  . Resting tremor 10/14/2013  . Polymyalgia rheumatica ([place]) 09/11/2013  . GERD (gastroesophageal reflux disease) 09/11/2013  . Major depressive disorder, recurrent episode, severe ([place]) 05/14/2013  . Pulmonary nodules 05/24/2012  . Carotid bruit 04/30/2012  . Essential hypertension 04/27/2012  . Anxiety state 04/27/2012  . Mixed hyperlipidemia 04/27/2012  . Cervical spondylosis with radiculopathy 04/19/2012    Orientation RESPIRATION BLADDER Height & Weight     Self,Place  Normal Incontinent Weight: 167 lb 15.9 oz (76.2 kg) Height:  5' (152.4 cm)  BEHAVIORAL SYMPTOMS/MOOD NEUROLOGICAL BOWEL NUTRITION STATUS      Incontinent Diet  AMBULATORY STATUS COMMUNICATION OF NEEDS Skin   Extensive Assist Verbally Normal                       Personal Care Assistance Level of Assistance  Bathing,Feeding,Dressing Bathing Assistance: Maximum assistance Feeding assistance: Limited assistance Dressing Assistance: Maximum assistance     Functional Limitations Info  Sight,Hearing Sight Info: Adequate Hearing Info: Adequate Speech Info: Adequate    SPECIAL CARE FACTORS FREQUENCY  PT (By licensed PT),OT (By licensed OT)     PT Frequency: 5x a week OT Frequency: 5x a week            Contractures Contractures Info: Not present    Additional Factors Info  Code Status,Allergies Code Status Info: DNR Allergies Info: Gabapentin   Statins           Current Medications (09/04/2020):  This is the current hospital active  medication list Current Facility-Administered Medications  Medication Dose Route Frequency Provider Last Rate Last Admin  . acetaminophen (TYLENOL) tablet 650 mg  650 mg Oral Q6H PRN [name], Areeg N, DO       Or  . acetaminophen (TYLENOL) suppository 650 mg  650 mg Rectal Q6H PRN [name], Areeg N, DO      . albuterol (VENTOLIN HFA) 108 (90  Base) MCG/ACT inhaler 2 puff  2 puff Inhalation Q6H PRN [name], Areeg N, DO      . ascorbic acid (VITAMIN C) tablet 250 mg  250 mg Oral Daily [name], Areeg N, DO   250 mg at 09/04/20 1158  . aspirin EC tablet 81 mg  81 mg Oral Daily [name], Areeg N, DO   81 mg at 09/04/20 1158  . busPIRone (BUSPAR) tablet 15 mg  15 mg Oral TID [name], Areeg N, DO   15 mg at 09/04/20 1157  . cefdinir (OMNICEF) capsule 300 mg  300 mg Oral Q12H [name], Areeg N, DO   300 mg at 09/04/20 1157  . cholecalciferol (VITAMIN D3) tablet 1,000 Units  1,000 Units Oral Daily [name], Areeg N, DO   1,000 Units at 09/04/20 1158  . clopidogrel (PLAVIX) tablet 75 mg  75 mg Oral q morning [name], Areeg N, DO   75 mg at 09/03/20 0956  . divalproex (DEPAKOTE) DR tablet 125 mg  125 mg Oral BID [name], Areeg N, DO   125 mg at 09/03/20 2013  . dorzolamide-timolol (COSOPT) 22.3-6.8 MG/ML ophthalmic solution 1 drop  1 drop Both Eyes BID Oda Kilts, MD   1 drop at 09/04/20 1206  . doxycycline (VIBRA-TABS) tablet 100 mg  100 mg Oral Q12H [name], Areeg N, DO      . enoxaparin (LOVENOX) injection 40 mg  40 mg Subcutaneous Q24H Gaylan Gerold, DO   40 mg at 09/04/20 1204  . latanoprost (XALATAN) 0.005 % ophthalmic solution 1 drop  1 drop Both Eyes QHS Oda Kilts, MD   1 drop at 09/03/20 2108  . mirtazapine (REMERON) tablet 15 mg  15 mg Oral QHS [name], Areeg N, DO   15 mg at 09/03/20 2013  . ondansetron (ZOFRAN) tablet 4 mg  4 mg Oral Q6H PRN [name], Areeg N, DO       Or  . ondansetron (ZOFRAN) injection 4 mg  4 mg Intravenous Q6H PRN [name], Areeg N, DO      . pantoprazole (PROTONIX) EC tablet 40 mg  40 mg Oral Daily [name], Areeg N, DO   40 mg at 09/04/20 1158  . polyethylene glycol (MIRALAX / GLYCOLAX) packet 17 g  17 g Oral Daily PRN [name], Areeg N, DO      . potassium chloride (KLOR-CON) packet 40 mEq  40 mEq Oral BID Gaylan Gerold, DO   40 mEq at 09/04/20 1157  . vancomycin (VANCOREADY) IVPB 750 mg/150 mL  750 mg Intravenous  Q24H Oda Kilts, MD 150 mL/hr at 09/03/20 2317 750 mg at 09/03/20 2317  . vitamin B-12 (CYANOCOBALAMIN) tablet 100 mcg  100 mcg Oral Daily [name], Areeg N, DO   100 mcg at 09/03/20 2355     Discharge Medications: Please see discharge summary for a list of discharge medications.  Relevant Imaging Results:  Relevant Lab Results:   Additional Information SS# [place], [place]

## 2020-09-05 LAB — CBC
HCT: 33.2 % — ABNORMAL LOW (ref 36.0–46.0)
Hemoglobin: 11.3 g/dL — ABNORMAL LOW (ref 12.0–15.0)
MCH: 34.1 pg — ABNORMAL HIGH (ref 26.0–34.0)
MCHC: 34 g/dL (ref 30.0–36.0)
MCV: 100.3 fL — ABNORMAL HIGH (ref 80.0–100.0)
Platelets: 137 10*3/uL — ABNORMAL LOW (ref 150–400)
RBC: 3.31 MIL/uL — ABNORMAL LOW (ref 3.87–5.11)
RDW: 13.8 % (ref 11.5–15.5)
WBC: 7.5 10*3/uL (ref 4.0–10.5)
nRBC: 0 % (ref 0.0–0.2)

## 2020-09-05 LAB — BASIC METABOLIC PANEL
Anion gap: 11 (ref 5–15)
BUN: 8 mg/dL (ref 8–23)
CO2: 24 mmol/L (ref 22–32)
Calcium: 8.7 mg/dL — ABNORMAL LOW (ref 8.9–10.3)
Chloride: 109 mmol/L (ref 98–111)
Creatinine, Ser: 0.91 mg/dL (ref 0.44–1.00)
GFR, Estimated: >60 mL/min (ref >60)
Glucose, Bld: 86 mg/dL (ref 70–99)
Potassium: 3.5 mmol/L (ref 3.5–5.1)
Sodium: 144 mmol/L (ref 135–145)

## 2020-09-05 NOTE — Progress Notes (Signed)
HD#4 Subjective:  Overnight Events: none  Patient reports feeling better today. She is alert, oriented and answer questions appropriately. States she is ready to go home. States she has been having difficulty with her ADLs. She states she knows what she needs to do if she goes home alone. She says her sister thinks she will do better at a rehab facility. She prefers to go to [facility]. States her stomach was bothering her but felt better after having a bowel movement.   Objective:  Vital signs in last 24 hours: Vitals:   09/04/20 0559 09/04/20 1209 09/04/20 2105 09/05/20 0439  BP: 90/65 (!) 160/73 140/74 (!) 133/98  Pulse: (!) 57 69  87  Resp: 18 18  20   Temp: 98.2 F (36.8 C) 98.6 F (37 C) 99.8 F (37.7 C) 99.3 F (37.4 C)  TempSrc:  Oral Oral Oral  SpO2:  98% 98% 95%  Weight:      Height:       Supplemental O2: Room Air SpO2: 95 % O2 Flow Rate (L/min): 5 L/min   Physical Exam:  Physical Exam Constitutional:      General: She is not in acute distress.    Appearance: Normal appearance.     Comments: Mental status improved  HENT:     Head: Normocephalic.  Eyes:     General:        Right eye: No discharge.        Left eye: No discharge.  Cardiovascular:     Rate and Rhythm: Normal rate and regular rhythm.  Pulmonary:     Effort: Pulmonary effort is normal. No respiratory distress.  Abdominal:     General: Bowel sounds are normal.     Tenderness: There is no abdominal tenderness.  Musculoskeletal:     Right lower leg: No edema.     Left lower leg: No edema.  Skin:    General: Skin is warm.  Neurological:     Mental Status: She is alert.  Psychiatric:        Mood and Affect: Mood normal.        Thought Content: Thought content normal.     Filed Weights   09/01/20 1309  Weight: 76.2 kg     Intake/Output Summary (Last 24 hours) at 09/05/2020 0658 Last data filed at 09/04/2020 1906 Gross per 24 hour  Intake -  Output 1400 ml  Net -1400 ml    Net IO Since Admission: -2,750 mL [09/05/20 0658]  Pertinent Labs: CBC Latest Ref Rng & Units 09/05/2020 09/04/2020 09/03/2020  WBC 4.0 - 10.5 K/uL 7.5 10.0 11.8(H)  Hemoglobin 12.0 - 15.0 g/dL 11.3(L) 10.9(L) 10.2(L)  Hematocrit 36.0 - 46.0 % 33.2(L) 32.8(L) 29.8(L)  Platelets 150 - 400 K/uL 137(L) 116(L) 104(L)    CMP Latest Ref Rng & Units 09/05/2020 09/04/2020 09/03/2020  Glucose 70 - 99 mg/dL 86 82 77  BUN 8 - 23 mg/dL 8 6(L) 13  Creatinine 0.44 - 1.00 mg/dL 0.91 0.83 1.06(H)  Sodium 135 - 145 mmol/L 144 144 146(H)  Potassium 3.5 - 5.1 mmol/L 3.5 3.5 3.4(L)  Chloride 98 - 111 mmol/L 109 108 109  CO2 22 - 32 mmol/L 24 26 24   Calcium 8.9 - 10.3 mg/dL 8.7(L) 9.0 8.9  Total Protein 6.5 - 8.1 g/dL - - -  Total Bilirubin 0.3 - 1.2 mg/dL - - -  Alkaline Phos 38 - 126 U/L - - -  AST 15 - 41 U/L - - -  ALT 0 - 44 U/L - - -    Imaging: No results found.  Assessment/Plan:   Principal Problem:   Pneumonia of right lower lobe due to infectious organism Active Problems:   Macrocytic anemia   CKD (chronic kidney disease) stage 3, GFR 30-59 ml/min (HCC)   Acute kidney injury superimposed on CKD ([place])   Sepsis with acute hypoxic respiratory failure (HCC)   Thrombocytopenia ([place])   Patient Summary: [name] is an 43 yoF with a medical history significant forchronic kidney disease, congenital solitary kidney, PVD, hypertension, HLD presenting to the emergency department for altered mental status.Found to be septic secondary to a right lower lobe pneumonia.   Sepsis with acute hypoxic respiratory failure Altered mental status Patient presented with altered mental status found to be septic secondary to pneumonia.Mental status improved significantly today. Will continue PO doxycycline and Cefdinir for today of 5 days.  --Day 5 of antibiotics with Doxycycline and Cefdinir -Continue to monitor cultures --Pending SNF placement. Patient agrees with plan  Macrocytic  anemia Stable.  B12 and Folatewithinnormallimit. Iron sat 12%, TIBC 210 with ferritin 99, suggest anemia of chronic disease. -Pending MMA -AM CBC  Thrombocytopenia-improving Likely due to on infection, continuing to improve.Pathsmear confirmed macrocytic anemia, neutrophilia w band, andthrombocytopenia. -Continue ASA, plavix   AKI-resolved Resolved, creatinine 0.83.  Currently euvolemic, can resume furosemide if needed -Trend BMP  Hypertension Home medications amlodipine 5 mg, furosemide 20 mg, and metoprolol tartrate 12.5mg  twice daily.  Blood pressure on the low side this morning 90/65.  Will continue to hold home medications.  Depression -Continue buspirone 15 mg 3 times daily -Contdivalproex 125 mg twice daily.  -Hold amitriptyline 5 mgin the setting of AMS.  Lumbar radiculopathy Hold home medications gabapentin 100 mg 3 times daily, pregabalin 25 mg 3 times daily and tramadol 50 mg 3 times daily as needed, in the setting of altered mental status.  Prior to Admission Living Arrangement: Home Anticipated Discharge Location: SNF Barriers to Discharge: SNF placement Dispo: Anticipated discharge pending clinical improvement and SNF placement.  Gaylan Gerold, DO 09/05/2020, 6:58 AM Pager: [phone]  Please contact the on call pager after 5 pm and on weekends at [phone].

## 2020-09-05 NOTE — Consult Note (Signed)
[place] Nurse Consult Note: Reason for Consult:small abraded area between buttocks.  Will add moisture barrier cream. Wound type:MASD Pressure Injury POA: N/A  WOC nursing team will not follow, but will remain available to this patient, the nursing and medical teams.  Please re-consult if needed. Thanks, Maudie Flakes, MSN, RN, [place], Arther Abbott  Pager# [phone]

## 2020-09-06 DIAGNOSIS — I10 Essential (primary) hypertension: Secondary | ICD-10-CM | POA: Diagnosis not present

## 2020-09-06 DIAGNOSIS — J189 Pneumonia, unspecified organism: Secondary | ICD-10-CM | POA: Diagnosis not present

## 2020-09-06 DIAGNOSIS — F32A Depression, unspecified: Secondary | ICD-10-CM | POA: Diagnosis not present

## 2020-09-06 DIAGNOSIS — M5416 Radiculopathy, lumbar region: Secondary | ICD-10-CM | POA: Diagnosis not present

## 2020-09-06 LAB — BASIC METABOLIC PANEL
Anion gap: 10 (ref 5–15)
BUN: 12 mg/dL (ref 8–23)
CO2: 23 mmol/L (ref 22–32)
Calcium: 9.2 mg/dL (ref 8.9–10.3)
Chloride: 112 mmol/L — ABNORMAL HIGH (ref 98–111)
Creatinine, Ser: 1.05 mg/dL — ABNORMAL HIGH (ref 0.44–1.00)
GFR, Estimated: 53 mL/min — ABNORMAL LOW (ref >60)
Glucose, Bld: 109 mg/dL — ABNORMAL HIGH (ref 70–99)
Potassium: 4.3 mmol/L (ref 3.5–5.1)
Sodium: 145 mmol/L (ref 135–145)

## 2020-09-06 LAB — CBC
HCT: 38.8 % (ref 36.0–46.0)
Hemoglobin: 12.8 g/dL (ref 12.0–15.0)
MCH: 34.3 pg — ABNORMAL HIGH (ref 26.0–34.0)
MCHC: 33 g/dL (ref 30.0–36.0)
MCV: 104 fL — ABNORMAL HIGH (ref 80.0–100.0)
Platelets: 132 10*3/uL — ABNORMAL LOW (ref 150–400)
RBC: 3.73 MIL/uL — ABNORMAL LOW (ref 3.87–5.11)
RDW: 13.7 % (ref 11.5–15.5)
WBC: 6.3 10*3/uL (ref 4.0–10.5)
nRBC: 0 % (ref 0.0–0.2)

## 2020-09-06 LAB — CULTURE, BLOOD (ROUTINE X 2): Culture: NO GROWTH

## 2020-09-06 LAB — SARS CORONAVIRUS 2 (TAT 6-24 HRS): SARS Coronavirus 2: NEGATIVE

## 2020-09-06 MED ORDER — AMLODIPINE BESYLATE 5 MG PO TABS
5.0000 mg | ORAL_TABLET | Freq: Every day | ORAL | Status: DC
  Administered 2020-09-06 – 2020-09-07 (×2): 5 mg via ORAL
  Filled 2020-09-06 (×2): qty 1

## 2020-09-06 NOTE — TOC Progression Note (Addendum)
Transition of Care [facility]) - Progression Note    Patient Details  Name: [name] [name] MRN: [id number] Date of Birth: 1941/01/24  Transition of Care [facility]) CM/SW [place], RN Phone Number: [phone]  09/06/2020, 9:47 AM  Clinical Narrative:    Insurance authorization initiated with [employer].  1300 CM spoke with patient and daughter. Both are agreeable to [facility] for rehab. CM spoke with Juliann Pulse at [facility] to verify that facility has a bed available. Per Juliann Pulse the facility has a bed available and can take patient asap.MD has been updated and covid test has been requested.    Expected Discharge Plan: [place] Barriers to Discharge: Continued Medical Work up  Expected Discharge Plan and Services Expected Discharge Plan: [place] arrangements for the past 2 months: Single Family Home                                       Social Determinants of Health (SDOH) Interventions    Readmission Risk Interventions No flowsheet data found.

## 2020-09-06 NOTE — Plan of Care (Signed)
  Problem: Education: Goal: Knowledge of General Education information will improve Description: Including pain rating scale, medication(s)/side effects and non-pharmacologic comfort measures Outcome: Progressing   Problem: Coping: Goal: Level of anxiety will decrease Outcome: Progressing   Problem: Pain Managment: Goal: General experience of comfort will improve Outcome: Progressing   Problem: Skin Integrity: Goal: Risk for impaired skin integrity will decrease Outcome: Progressing   Problem: Clinical Measurements: Goal: Ability to maintain a body temperature in the normal range will improve Outcome: Progressing   Problem: Respiratory: Goal: Ability to maintain adequate ventilation will improve Outcome: Progressing   Problem: Respiratory: Goal: Ability to maintain a clear airway will improve Outcome: Progressing

## 2020-09-06 NOTE — Progress Notes (Signed)
Physical Therapy Treatment Patient Details Name: [name] [name] MRN: [id number] DOB: 21-Dec-1940 Today's Date: 09/06/2020    History of Present Illness [name] is an 10 yoF with a medical history significant for chronic kidney disease, congenital solitary kidney, PVD, hypertension, HLD presenting to the emergency department for altered mental status.    PT Comments    Pt much improved cognitively and functionally. Pt able to follow all commands and amb with RW today with minimal assist. Pt not impulsively or restless, was quite pleasant and cooperative. Despite these improvements pt remains unsafe to return home alone due to increased falls risk and impaired problem solving and in ability to safely complete ADLs and IADLs. Continue to recommend SNF upon d/c. Acute PT to cont to follow.    Follow Up Recommendations  SNF     Equipment Recommendations  None recommended by PT    Recommendations for Other Services       Precautions / Restrictions Precautions Precautions: Fall Restrictions Weight Bearing Restrictions: No    Mobility  Bed Mobility               General bed mobility comments: pt up in chair upon PT arrival    Transfers Overall transfer level: Needs assistance Equipment used: Rolling walker (2 wheeled) Transfers: Sit to/from Stand Sit to Stand: Min assist         General transfer comment: verbal cues to push up from chair, minA to power up and steady during transition of hands to RW, increased time  Ambulation/Gait Ambulation/Gait assistance: Min assist Gait Distance (Feet): 60 Feet (x2) Assistive device: Rolling walker (2 wheeled) Gait Pattern/deviations: Step-through pattern;Decreased stride length Gait velocity: slow Gait velocity interpretation: <1.8 ft/sec, indicate of risk for recurrent falls General Gait Details: decrased pace, noted SOB however SpO2 >95% on RA, pt requested seated rest break but then walked by to room, minA for walker  mangement during turns   Chief Strategy Officer    Modified Rankin (Stroke Patients Only)       Balance Overall balance assessment: Needs assistance         Standing balance support: Bilateral upper extremity supported;During functional activity Standing balance-Leahy Scale: Poor Standing balance comment: reliant on B UE support                            Cognition Arousal/Alertness: Awake/alert Behavior During Therapy: WFL for tasks assessed/performed Overall Cognitive Status: Within Functional Limits for tasks assessed                                 General Comments: pt more coherent, not confused but can be very tangential requiring verbal cues to stay on task      Exercises      General Comments General comments (skin integrity, edema, etc.): SPO2 >95% on RA      Pertinent Vitals/Pain Pain Assessment: No/denies pain    Home Living                      Prior Function            PT Goals (current goals can now be found in the care plan section) Acute Rehab PT Goals Patient Stated Goal: home Progress towards PT goals: Progressing toward goals    Frequency  Min 3X/week      PT Plan Current plan remains appropriate    Co-evaluation              AM-PAC PT "6 Clicks" Mobility   Outcome Measure  Help needed turning from your back to your side while in a flat bed without using bedrails?: None Help needed moving from lying on your back to sitting on the side of a flat bed without using bedrails?: A Little Help needed moving to and from a bed to a chair (including a wheelchair)?: A Little Help needed standing up from a chair using your arms (e.g., wheelchair or bedside chair)?: A Little Help needed to walk in hospital room?: A Little Help needed climbing 3-5 steps with a railing? : A Lot 6 Click Score: 18    End of Session Equipment Utilized During Treatment: Gait belt Activity  Tolerance: Patient tolerated treatment well Patient left: in chair;with call bell/phone within reach;with chair alarm set Nurse Communication: Mobility status PT Visit Diagnosis: Unsteadiness on feet (R26.81);Muscle weakness (generalized) (M62.81)     Time: 0962-8366 PT Time Calculation (min) (ACUTE ONLY): 20 min  Charges:  $Gait Training: 8-22 mins                     Kittie Plater, PT, DPT Acute Rehabilitation Services Pager #: [phone] Office #: [phone]    Berline Lopes 09/06/2020, 2:11 PM

## 2020-09-06 NOTE — Plan of Care (Signed)
  Problem: Clinical Measurements: Goal: Respiratory complications will improve Outcome: Progressing   Problem: Clinical Measurements: Goal: Cardiovascular complication will be avoided Outcome: Progressing   Problem: Nutrition: Goal: Adequate nutrition will be maintained Outcome: Progressing   Problem: Education: Goal: Knowledge of General Education information will improve Description: Including pain rating scale, medication(s)/side effects and non-pharmacologic comfort measures Outcome: Not Progressing  AMS

## 2020-09-06 NOTE — Care Management Important Message (Signed)
Important Message  Patient Details  Name: JENNESS STEMLER MRN: [id number] Date of Birth: 1941-06-15   Medicare Important Message Given:  Yes   Patient had a precaution in place IM mailed to the home address.  Pocahontas Cohenour 09/06/2020, 2:44 PM

## 2020-09-06 NOTE — Progress Notes (Signed)
HD#5 Subjective:  Overnight Events: no OV  Patient is sitting on a reclining chair during examination.  She appears comfortable and in no acute distress.  Patient is oriented and answers questions appropriately.  She states that she had an episode of headache last night/earlier this morning, located at the frontal and bilateral eyes, described as throbbing.  Patient states that she has had these headaches in the past and were relieved with Tylenol.  Patient and her daughter decided to choose [facility].  Objective:  Vital signs in last 24 hours: Vitals:   09/04/20 2105 09/05/20 0439 09/05/20 0821 09/05/20 2110  BP: 140/74 (!) 133/98 (!) 149/74 (!) 150/62  Pulse:  87  81  Resp:  20 20 20   Temp: 99.8 F (37.7 C) 99.3 F (37.4 C) 99 F (37.2 C) 98.1 F (36.7 C)  TempSrc: Oral Oral Oral   SpO2: 98% 95% 95% 93%  Weight:      Height:       Supplemental O2: Room Air SpO2: 93 % O2 Flow Rate (L/min): 5 L/min   Physical Exam:  Physical Exam Constitutional:      General: She is not in acute distress. HENT:     Head: Normocephalic.  Eyes:     General:        Right eye: No discharge.        Left eye: No discharge.  Cardiovascular:     Rate and Rhythm: Normal rate and regular rhythm.  Pulmonary:     Effort: Pulmonary effort is normal. No respiratory distress.  Abdominal:     General: Bowel sounds are normal.     Tenderness: There is no abdominal tenderness.  Skin:    General: Skin is warm.  Neurological:     Mental Status: She is alert.  Psychiatric:        Mood and Affect: Mood normal.     Filed Weights   09/01/20 1309  Weight: 76.2 kg     Intake/Output Summary (Last 24 hours) at 09/06/2020 7619 Last data filed at 09/05/2020 2100 Gross per 24 hour  Intake --  Output 450 ml  Net -450 ml   Net IO Since Admission: -3,200 mL [09/06/20 0712]  Pertinent Labs: CBC Latest Ref Rng & Units 09/06/2020 09/05/2020 09/04/2020  WBC 4.0 - 10.5 K/uL 6.3 7.5 10.0   Hemoglobin 12.0 - 15.0 g/dL 12.8 11.3(L) 10.9(L)  Hematocrit 36.0 - 46.0 % 38.8 33.2(L) 32.8(L)  Platelets 150 - 400 K/uL 132(L) 137(L) 116(L)    CMP Latest Ref Rng & Units 09/06/2020 09/05/2020 09/04/2020  Glucose 70 - 99 mg/dL 109(H) 86 82  BUN 8 - 23 mg/dL 12 8 6(L)  Creatinine 0.44 - 1.00 mg/dL 1.05(H) 0.91 0.83  Sodium 135 - 145 mmol/L 145 144 144  Potassium 3.5 - 5.1 mmol/L 4.3 3.5 3.5  Chloride 98 - 111 mmol/L 112(H) 109 108  CO2 22 - 32 mmol/L 23 24 26   Calcium 8.9 - 10.3 mg/dL 9.2 8.7(L) 9.0  Total Protein 6.5 - 8.1 g/dL - - -  Total Bilirubin 0.3 - 1.2 mg/dL - - -  Alkaline Phos 38 - 126 U/L - - -  AST 15 - 41 U/L - - -  ALT 0 - 44 U/L - - -    Imaging: No results found.  Assessment/Plan:   Principal Problem:   Pneumonia of right lower lobe due to infectious organism Active Problems:   Macrocytic anemia   CKD (chronic kidney disease) stage  3, GFR 30-59 ml/min (HCC)   Acute kidney injury superimposed on CKD ([place])   Sepsis with acute hypoxic respiratory failure (HCC)   Thrombocytopenia ([place])    Patient Summary: [name] [name] is an 32 yoF with a medical history significant forchronic kidney disease, congenital solitary kidney, PVD, hypertension, HLD presenting to the emergency department for altered mental status.Found to be septic secondary to a right lower lobe pneumonia. Mental status back to baseline.     Sepsis with acute hypoxic respiratory failure Altered mental status-back to baseline Patient presented with altered mental status found to be septic secondary to pneumonia.  She finished 5 days of antibiotics. Mental status back to baseline.  --Day 5 of antibiotics with Doxycycline and Cefdinir --Pending SNF placement.   Macrocytic anemia Stable.B12 and Folatewithinnormallimit.Iron sat 12%, TIBC 210 with ferritin 99, suggest anemia of chronic disease. -Pending MMA -AM CBC  Thrombocytopenia-improving Likely due to on infection, continuing  to improve.Pathsmear confirmed macrocytic anemia, neutrophilia w band, andthrombocytopenia. -Continue ASA, plavix   Hypertension BP elevated today. Will resume Amlodipine.  -Resume Amlodipine 5 mg -Continue holding lasix and metoprolol  Depression -Continue buspirone 15 mg 3 times daily -Contdivalproex 125 mg twice daily.  -Hold amitriptyline 5 mgin the setting of AMS.  Lumbar radiculopathy Hold home medications gabapentin 100 mg 3 times daily, pregabalin 25 mg 3 times daily and tramadol 50 mg 3 times daily as needed, in the setting of altered mental status.  Prior to Admission Living Arrangement:Home Anticipated Discharge Location:SNF Barriers to Discharge:SNF placement Dispo: pending SNF placement.  Gaylan Gerold, DO 09/06/2020, 7:12 AM Pager: [phone]  Please contact the on call pager after 5 pm and on weekends at [phone].

## 2020-09-07 DIAGNOSIS — G4733 Obstructive sleep apnea (adult) (pediatric): Secondary | ICD-10-CM | POA: Diagnosis not present

## 2020-09-07 DIAGNOSIS — R262 Difficulty in walking, not elsewhere classified: Secondary | ICD-10-CM | POA: Diagnosis not present

## 2020-09-07 DIAGNOSIS — R5381 Other malaise: Secondary | ICD-10-CM | POA: Diagnosis not present

## 2020-09-07 DIAGNOSIS — M25569 Pain in unspecified knee: Secondary | ICD-10-CM | POA: Diagnosis not present

## 2020-09-07 DIAGNOSIS — J9600 Acute respiratory failure, unspecified whether with hypoxia or hypercapnia: Secondary | ICD-10-CM | POA: Diagnosis not present

## 2020-09-07 DIAGNOSIS — R4182 Altered mental status, unspecified: Secondary | ICD-10-CM | POA: Diagnosis not present

## 2020-09-07 DIAGNOSIS — N179 Acute kidney failure, unspecified: Secondary | ICD-10-CM | POA: Diagnosis not present

## 2020-09-07 DIAGNOSIS — N1831 Chronic kidney disease, stage 3a: Secondary | ICD-10-CM | POA: Diagnosis not present

## 2020-09-07 DIAGNOSIS — S51811A Laceration without foreign body of right forearm, initial encounter: Secondary | ICD-10-CM | POA: Diagnosis not present

## 2020-09-07 DIAGNOSIS — Q600 Renal agenesis, unilateral: Secondary | ICD-10-CM | POA: Diagnosis not present

## 2020-09-07 DIAGNOSIS — D539 Nutritional anemia, unspecified: Secondary | ICD-10-CM | POA: Diagnosis not present

## 2020-09-07 DIAGNOSIS — E785 Hyperlipidemia, unspecified: Secondary | ICD-10-CM | POA: Diagnosis not present

## 2020-09-07 DIAGNOSIS — D531 Other megaloblastic anemias, not elsewhere classified: Secondary | ICD-10-CM | POA: Diagnosis not present

## 2020-09-07 DIAGNOSIS — H401133 Primary open-angle glaucoma, bilateral, severe stage: Secondary | ICD-10-CM | POA: Diagnosis not present

## 2020-09-07 DIAGNOSIS — R600 Localized edema: Secondary | ICD-10-CM | POA: Diagnosis not present

## 2020-09-07 DIAGNOSIS — I10 Essential (primary) hypertension: Secondary | ICD-10-CM | POA: Diagnosis not present

## 2020-09-07 DIAGNOSIS — W19XXXD Unspecified fall, subsequent encounter: Secondary | ICD-10-CM | POA: Diagnosis not present

## 2020-09-07 DIAGNOSIS — M25511 Pain in right shoulder: Secondary | ICD-10-CM | POA: Diagnosis not present

## 2020-09-07 DIAGNOSIS — R251 Tremor, unspecified: Secondary | ICD-10-CM | POA: Diagnosis not present

## 2020-09-07 DIAGNOSIS — K219 Gastro-esophageal reflux disease without esophagitis: Secondary | ICD-10-CM | POA: Diagnosis not present

## 2020-09-07 DIAGNOSIS — F411 Generalized anxiety disorder: Secondary | ICD-10-CM | POA: Diagnosis not present

## 2020-09-07 DIAGNOSIS — M47892 Other spondylosis, cervical region: Secondary | ICD-10-CM | POA: Diagnosis not present

## 2020-09-07 DIAGNOSIS — J9601 Acute respiratory failure with hypoxia: Secondary | ICD-10-CM | POA: Diagnosis not present

## 2020-09-07 DIAGNOSIS — I739 Peripheral vascular disease, unspecified: Secondary | ICD-10-CM | POA: Diagnosis not present

## 2020-09-07 DIAGNOSIS — E43 Unspecified severe protein-calorie malnutrition: Secondary | ICD-10-CM | POA: Diagnosis not present

## 2020-09-07 DIAGNOSIS — C4491 Basal cell carcinoma of skin, unspecified: Secondary | ICD-10-CM | POA: Diagnosis not present

## 2020-09-07 DIAGNOSIS — M353 Polymyalgia rheumatica: Secondary | ICD-10-CM | POA: Diagnosis not present

## 2020-09-07 DIAGNOSIS — L409 Psoriasis, unspecified: Secondary | ICD-10-CM | POA: Diagnosis not present

## 2020-09-07 DIAGNOSIS — M6281 Muscle weakness (generalized): Secondary | ICD-10-CM | POA: Diagnosis not present

## 2020-09-07 DIAGNOSIS — M25562 Pain in left knee: Secondary | ICD-10-CM | POA: Diagnosis not present

## 2020-09-07 DIAGNOSIS — R300 Dysuria: Secondary | ICD-10-CM | POA: Diagnosis not present

## 2020-09-07 DIAGNOSIS — F332 Major depressive disorder, recurrent severe without psychotic features: Secondary | ICD-10-CM | POA: Diagnosis not present

## 2020-09-07 DIAGNOSIS — R41841 Cognitive communication deficit: Secondary | ICD-10-CM | POA: Diagnosis not present

## 2020-09-07 DIAGNOSIS — J189 Pneumonia, unspecified organism: Secondary | ICD-10-CM | POA: Diagnosis not present

## 2020-09-07 DIAGNOSIS — M5416 Radiculopathy, lumbar region: Secondary | ICD-10-CM | POA: Diagnosis not present

## 2020-09-07 DIAGNOSIS — D696 Thrombocytopenia, unspecified: Secondary | ICD-10-CM | POA: Diagnosis not present

## 2020-09-07 LAB — BASIC METABOLIC PANEL
Anion gap: 11 (ref 5–15)
BUN: 10 mg/dL (ref 8–23)
CO2: 22 mmol/L (ref 22–32)
Calcium: 8.9 mg/dL (ref 8.9–10.3)
Chloride: 109 mmol/L (ref 98–111)
Creatinine, Ser: 0.94 mg/dL (ref 0.44–1.00)
GFR, Estimated: >60 mL/min (ref >60)
Glucose, Bld: 92 mg/dL (ref 70–99)
Potassium: 3.9 mmol/L (ref 3.5–5.1)
Sodium: 142 mmol/L (ref 135–145)

## 2020-09-07 LAB — CBC
HCT: 35.9 % — ABNORMAL LOW (ref 36.0–46.0)
Hemoglobin: 12 g/dL (ref 12.0–15.0)
MCH: 34.3 pg — ABNORMAL HIGH (ref 26.0–34.0)
MCHC: 33.4 g/dL (ref 30.0–36.0)
MCV: 102.6 fL — ABNORMAL HIGH (ref 80.0–100.0)
Platelets: 136 10*3/uL — ABNORMAL LOW (ref 150–400)
RBC: 3.5 MIL/uL — ABNORMAL LOW (ref 3.87–5.11)
RDW: 13.8 % (ref 11.5–15.5)
WBC: 6.7 10*3/uL (ref 4.0–10.5)
nRBC: 0 % (ref 0.0–0.2)

## 2020-09-07 NOTE — Plan of Care (Signed)

## 2020-09-07 NOTE — TOC Progression Note (Signed)
Transition of Care [facility]) - Progression Note    Patient Details  Name: [name] [name] MRN: [id number] Date of Birth: 14-Nov-1940  Transition of Care [facility]) CM/SW Contact  Angelita Ingles, RN Phone Number: [phone]  09/07/2020, 10:02 AM  Clinical Narrative:    [name] with [employer] called with approval for rehab. Patient has been approved for 7 days. Approval # [id number]. Patient has not been approved for ambulance not a medical necessity. CM has spoken with daughter who states that she is able to transport patient to [facility].  MD to be made aware.   Expected Discharge Plan: [place] Barriers to Discharge: Continued Medical Work up  Expected Discharge Plan and Services Expected Discharge Plan: [place] arrangements for the past 2 months: Single Family Home                                       Social Determinants of Health (SDOH) Interventions    Readmission Risk Interventions No flowsheet data found.

## 2020-09-07 NOTE — Progress Notes (Signed)
Occupational Therapy Treatment Patient Details Name: [name] [name] MRN: [id number] DOB: 12/16/1940 Today's Date: 09/07/2020    History of present illness Ms. Amenda L. Bodiford is an 54 yoF with a medical history significant for chronic kidney disease, congenital solitary kidney, PVD, hypertension, HLD presenting to the emergency department for altered mental status.   OT comments  Pt progressing well towards OT goals and motivated to participate. Pt overall Supervision for various grooming tasks standing at sink > 5-7 min, no LOB and able to sequence ADLs without cues. Pt endorses fatigue after these tasks. Pt with improving cognition though with decreased attention to tasks and need for redirection cues. Pt overall min guard for mobility in room using RW. VSS on RA. Plan to progress cognition and endurance during ADLs/mobility with next session.    Follow Up Recommendations  SNF;Supervision/Assistance - 24 hour    Equipment Recommendations  Other (comment) (defer to next venue)    Recommendations for Other Services      Precautions / Restrictions Precautions Precautions: Fall Restrictions Weight Bearing Restrictions: No       Mobility Bed Mobility               General bed mobility comments: up in recliner on entry    Transfers Overall transfer level: Needs assistance Equipment used: Rolling walker (2 wheeled) Transfers: Sit to/from Stand Sit to Stand: Supervision         General transfer comment: Supervision for sit to stand with RW, min guard for safety with mobility    Balance Overall balance assessment: Needs assistance Sitting-balance support: No upper extremity supported;Feet supported Sitting balance-Leahy Scale: Fair     Standing balance support: Bilateral upper extremity supported;During functional activity Standing balance-Leahy Scale: Fair Standing balance comment: fair static standing at sink, need for B UE support for mobility                            ADL either performed or assessed with clinical judgement   ADL Overall ADL's : Needs assistance/impaired     Grooming: Supervision/safety;Standing;Wash/dry face;Oral care Grooming Details (indicate cue type and reason): Supervision for safety, able to stand for tasks > 5 min without LOB though endorses increasing back pain and fatigue                             Functional mobility during ADLs: Min guard;Rolling walker General ADL Comments: improving cognition though limitations still evident     Vision   Vision Assessment?: No apparent visual deficits   Perception     Praxis      Cognition Arousal/Alertness: Awake/alert Behavior During Therapy: WFL for tasks assessed/performed Overall Cognitive Status: Impaired/Different from baseline Area of Impairment: Attention;Memory;Safety/judgement;Awareness;Problem solving                   Current Attention Level: Selective Memory: Decreased short-term memory   Safety/Judgement: Decreased awareness of safety;Decreased awareness of deficits Awareness: Emergent Problem Solving: Difficulty sequencing;Requires verbal cues General Comments: tangential conversation, improving safety and awareness but still with decreased awareness of deficits, etc. cues to stay on task. very pleasant and joking throughout        Exercises     Shoulder Instructions       General Comments VSS on RA. Pt endorses some irritation in throat when coughing - collab with RN for warm tea or broth    Pertinent Vitals/  Pain       Pain Assessment: Faces Faces Pain Scale: Hurts a little bit Pain Location: back Pain Descriptors / Indicators: Discomfort;Grimacing Pain Intervention(s): Repositioned  Home Living                                          Prior Functioning/Environment              Frequency  Min 2X/week        Progress Toward Goals  OT Goals(current goals can now be found in the  care plan section)  Progress towards OT goals: Progressing toward goals  Acute Rehab OT Goals Patient Stated Goal: go home OT Goal Formulation: With patient Time For Goal Achievement: 09/16/20 Potential to Achieve Goals: Good ADL Goals Pt Will Perform Grooming: with supervision;standing Pt Will Perform Lower Body Dressing: with supervision;sit to/from stand;sitting/lateral leans Pt Will Transfer to Toilet: with supervision;ambulating Pt Will Perform Toileting - Clothing Manipulation and hygiene: with supervision;sitting/lateral leans;sit to/from stand Additional ADL Goal #1: Pt to demonstrate ability to attend to functional task > 5 minutes Additional ADL Goal #2: Pt to be A&Ox3 with use of environmental cues.  Plan Discharge plan remains appropriate    Co-evaluation                 AM-PAC OT "6 Clicks" Daily Activity     Outcome Measure   Help from another person eating meals?: None Help from another person taking care of personal grooming?: A Little Help from another person toileting, which includes using toliet, bedpan, or urinal?: A Lot Help from another person bathing (including washing, rinsing, drying)?: A Little Help from another person to put on and taking off regular upper body clothing?: A Little Help from another person to put on and taking off regular lower body clothing?: A Lot 6 Click Score: 17    End of Session Equipment Utilized During Treatment: Rolling walker  OT Visit Diagnosis: Unsteadiness on feet (R26.81);Other abnormalities of gait and mobility (R26.89);Muscle weakness (generalized) (M62.81);Other symptoms and signs involving cognitive function   Activity Tolerance Patient tolerated treatment well   Patient Left in chair;with call bell/phone within reach;with chair alarm set   Nurse Communication Mobility status;Other (comment) (request for broth or warm tea)        Time: 4917-9150 OT Time Calculation (min): 23 min  Charges: OT General  Charges $OT Visit: 1 Visit OT Treatments $Self Care/Home Management : 23-37 mins  Malachy Chamber, OTR/L Acute Rehab Services Office: [phone]   Layla Maw 09/07/2020, 10:52 AM

## 2020-09-07 NOTE — Discharge Summary (Cosign Needed Addendum)
Name: SCARLETH BRAME MRN: [id number] DOB: [birth year] 80 y.o. PCP: [name], MD  Date of Admission: 09/01/2020  1:18 PM Date of Discharge: 09/07/2020 Attending Physician: Axel Filler, *  Discharge Diagnosis: 1. Sepsis with acute hypoxic respiratory failure  2. Altered mental status  3.Macrocytic anemia 4. Thrombocytopenia   Discharge Medications: Allergies as of 09/07/2020      Reactions   Gabapentin    Statins    Gabapentin Itching   Was deleted since patient was taking, but now experiencing itching again.    Statins Swelling, Rash, Other (See Comments)   Swelling involving tongue; also causes muscle pain      Medication List    STOP taking these medications   amitriptyline 10 MG tablet Commonly known as: ELAVIL   furosemide 20 MG tablet Commonly known as: LASIX   gabapentin 100 MG capsule Commonly known as: NEURONTIN   meclizine 25 MG tablet Commonly known as: ANTIVERT   metoprolol tartrate 25 MG tablet Commonly known as: LOPRESSOR   traMADol 50 MG tablet Commonly known as: ULTRAM     TAKE these medications   albuterol 108 (90 Base) MCG/ACT inhaler Commonly known as: VENTOLIN HFA Inhale 2 puffs into the lungs every 6 (six) hours as needed for cough, wheezing or shortness of breath.   amLODipine 5 MG tablet Commonly known as: NORVASC TAKE 1 TABLET(5 MG) BY MOUTH DAILY What changed:   how much to take  how to take this  when to take this  additional instructions   aspirin 81 MG EC tablet Take 1 tablet (81 mg total) by mouth daily.   busPIRone 15 MG tablet Commonly known as: BUSPAR Take 1 tablet (15 mg total) by mouth 3 (three) times daily.   cholecalciferol 1000 units tablet Commonly known as: VITAMIN D Take 1,000 Units by mouth daily.   clopidogrel 75 MG tablet Commonly known as: PLAVIX Take 1 tablet (75 mg total) by mouth every morning.   divalproex 125 MG DR tablet Commonly known as: DEPAKOTE Take 125 mg by mouth 2 (two)  times daily.   Dorzolamide HCl-Timolol Mal PF 2-0.5 % Soln Place 1 drop into both eyes 2 (two) times daily.   Cosopt PF 22.3-6.8 MG/ML Soln ophthalmic solution Generic drug: dorzolamidel-timolol Place 1 drop into both eyes 2 (two) times daily.   ferrous sulfate 325 (65 FE) MG tablet Take 325 mg by mouth daily.   Latanoprostene Bunod 0.024 % Soln Place 1 drop into both eyes at bedtime.   Vyzulta 0.024 % Soln Generic drug: Latanoprostene Bunod Apply 1 drop to eye at bedtime.   mirtazapine 15 MG tablet Commonly known as: REMERON Take 1 tablet (15 mg total) by mouth at bedtime.   pantoprazole 40 MG tablet Commonly known as: PROTONIX Take 40 mg by mouth daily.   VITAMIN B 12 PO Take 1 tablet by mouth daily.   VITAMIN C PO Take 1 tablet by mouth daily.       Disposition and follow-up:   Ms.Yarielys L Franey was discharged from [facility] in Stable condition.  At the hospital follow up visit please address:  1.   Sepsis with acute hypoxic respiratory failure Altered mental status:  Patient came in with sepsis secondary to pneumonia and treated with 5-day course of antibiotics.  Mentation improved.  Centrally acting medications were also held.  Stopped amitriptyline, tramadol, gabapentin, and meclizine.  Patient reports she is doing well off of these medications and this was review with  her.  Please review these medication changes at follow-up.  Macrocytic anemia: Hemoglobin improved to normal on admission with treatment of sepsis.  B12 and folate within normal limits.  Iron studies suggest anemia of chronic disease.  With ongoing macro cytosis MMA was sent.  Follow-up MMA and further work-up of macrocytosis if needed.   Thrombocytopenia: Platelets were 90, trending up daily to 136 on discharge  Likely in setting of sepsis. Obtain CBC.  Hypertension : Patient's metoprolol was held.  Amlodipine continued after treatment of sepsis. BP check and restart medications as  needed.   2.  Labs / imaging needed at time of follow-up: CBC  3.  Pending labs/ test needing follow-up: Methylmalonic acid  Follow-up Appointments:   Hospital Course by problem list: Sepsis with acute hypoxic respiratory failure Altered mental status-back to baseline Patient presented with altered mental status found to be septic secondary to pneumonia.  She finished 5 days of antibiotics with MRSA coverage. Mental status back to baseline.   Macrocytic anemia Stable.B12 and Folatewithinnormallimit.Iron sat 12%, TIBC 210 with ferritin 99, suggest anemia of chronic disease.  - Hgb 12 (nl), MCV 102.6 on day of discharge - Pending MMA at discharge  Thrombocytopenia-improving with treatment of sepsis Likely due to on infection, continuing to improve.Pathsmear confirmed macrocytic anemia, neutrophilia w band, andthrombocytopenia.Continued Asprin and Plavix  Hypertension BP elevated today.Resumed amlodipine. Held metoprolol.   Depression -Continue buspirone 15 mg 3 times daily -Contdivalproex 125 mg twice daily.  -Stopped amitriptyline 5 mgin the setting of AMS.  Lumbar radiculopathy Hold home medications gabapentin 100 mg 3 times daily and  tramadol 50 mg 3 times daily as needed. Patients pain under control in hospital on discharge.    Discharge Exam:   BP (!) 141/75 (BP Location: Right Arm)   Pulse (!) 103   Temp 98.7 F (37.1 C) (Oral)   Resp 16   Ht 5' (1.524 m)   Wt 76.2 kg   SpO2 95%   BMI 32.81 kg/m  Discharge exam: General: NAD, elderly women , very pleasant in speaking in full sentences HEENT: Normocephalic, atraumatic , EOMI, Conjunctivae normal  Cardiovascular: Normal rate, regular rhythm.  No murmurs, rubs, or gallops  Pulmonary : Effort normal, breath sounds normal. No wheezes, rales, or rhonchi Abdominal: soft, nontender,  bowel sounds present Musculoskeletal: Trace edema in lower extremities, no tenderness Psychiatric/Behavioral:   normal mood, normal behavior    Pertinent Labs, Studies, and Procedures:   CMP Latest Ref Rng & Units 09/07/2020 09/06/2020 09/05/2020  Glucose 70 - 99 mg/dL 92 109(H) 86  BUN 8 - 23 mg/dL 10 12 8   Creatinine 0.44 - 1.00 mg/dL 0.94 1.05(H) 0.91  Sodium 135 - 145 mmol/L 142 145 144  Potassium 3.5 - 5.1 mmol/L 3.9 4.3 3.5  Chloride 98 - 111 mmol/L 109 112(H) 109  CO2 22 - 32 mmol/L 22 23 24   Calcium 8.9 - 10.3 mg/dL 8.9 9.2 8.7(L)  Total Protein 6.5 - 8.1 g/dL - - -  Total Bilirubin 0.3 - 1.2 mg/dL - - -  Alkaline Phos 38 - 126 U/L - - -  AST 15 - 41 U/L - - -  ALT 0 - 44 U/L - - -   CBC Latest Ref Rng & Units 09/07/2020 09/06/2020 09/05/2020  WBC 4.0 - 10.5 K/uL 6.7 6.3 7.5  Hemoglobin 12.0 - 15.0 g/dL 12.0 12.8 11.3(L)  Hematocrit 36.0 - 46.0 % 35.9(L) 38.8 33.2(L)  Platelets 150 - 400 K/uL 136(L) 132(L) 137(L)   DG  Thoracic Spine 2 View  Result Date: 09/01/2020 CLINICAL DATA:  [place] EXAM: THORACIC SPINE 2 VIEWS COMPARISON:  04/03/2018 FINDINGS: Frontal and lateral views of the thoracic spine demonstrate rotatory scoliosis, with significant left convex component at the thoracolumbar junction. There are no acute displaced fractures. Extensive multilevel thoracic spondylosis most pronounced from approximately T4 through T9. Paraspinal soft tissues are unremarkable. Lungs are clear. IMPRESSION: 1. Rotatory scoliosis and multilevel thoracic spondylosis. No acute fracture. Electronically Signed   By: Randa Ngo M.D.   On: 09/01/2020 16:05   DG Lumbar Spine Complete  Result Date: 09/01/2020 CLINICAL DATA:  [place] EXAM: LUMBAR SPINE - COMPLETE 4+ VIEW COMPARISON:  09/11/2013 FINDINGS: Frontal, bilateral oblique, lateral views of the lumbar spine are obtained. 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 Stable right convex scoliosis centered at L3/L4. Otherwise alignment is anatomic. No acute fractures. There is significant multilevel spondylosis  greatest at L2-3, L4-5, and L5-S1. Mild diffuse facet hypertrophy. Sacroiliac joints are normal. Vascular stents are seen within the iliac distributions. IMPRESSION: 1. Progressive spondylosis and facet hypertrophy, greatest at L2-3, L4-5 common L5-S1. 2. No acute displaced fracture. Electronically Signed   By: Randa Ngo M.D.   On: 09/01/2020 16:04   CT Head Wo Contrast  Result Date: 09/01/2020 CLINICAL DATA:  Mental status change.  Hypoxia and dysuria. EXAM: CT HEAD WITHOUT CONTRAST CT CERVICAL SPINE WITHOUT CONTRAST TECHNIQUE: Multidetector CT imaging of the head and cervical spine was performed following the standard protocol without intravenous contrast. Multiplanar CT image reconstructions of the cervical spine were also generated. COMPARISON:  09/06/2019 FINDINGS: CT HEAD FINDINGS Brain: No evidence of acute infarction, hemorrhage, hydrocephalus, extra-axial collection or mass lesion/mass effect. There is mild diffuse low-attenuation within the subcortical and periventricular white matter compatible with chronic microvascular disease. Prominence of the sulci and ventricles are noted compatible with brain atrophy. Vascular: No hyperdense vessel or unexpected calcification. Skull: Normal. Negative for fracture or focal lesion. Sinuses/Orbits: The paranasal sinuses and mastoid air cells are clear. Other: None. CT CERVICAL SPINE FINDINGS Alignment: Normal. Skull base and vertebrae: The cervical vertebral body heights are well preserved. No fractures identified. Soft tissues and spinal canal: No prevertebral fluid or swelling. No visible canal hematoma. Disc levels: Status post ACDF of C4 through C6. Solid fusion of the C4-5 and C5-6 disc spaces noted. Moderate degenerative changes are noted at the C3-4 disc space and C6-7. Upper chest: Scattered ground-glass densities are identified bilaterally. Decreased AP diameter of the trachea is identified compatible with tracheo malacia, image 83/6. Other: None  IMPRESSION: 1. No acute intracranial abnormalities. 2. Chronic small vessel ischemic change and brain atrophy. 3. No evidence for cervical spine fracture or dislocation. 4. Status post ACDF of C4 through C6 with solid fusion of the C4-5 and C5-6 disc spaces. 5. Scattered ground-glass densities are identified bilaterally within the upper lobes. Findings are nonspecific but may reflect underlying inflammation. 6. Tracheobronchomalacia. Electronically Signed   By: Kerby Moors M.D.   On: 09/01/2020 16:25   CT Cervical Spine Wo Contrast  Result Date: 09/01/2020 CLINICAL DATA:  Mental status change.  Hypoxia and dysuria. EXAM: CT HEAD WITHOUT CONTRAST CT CERVICAL SPINE WITHOUT CONTRAST TECHNIQUE: Multidetector CT imaging of the head and cervical spine was performed following the standard protocol without intravenous contrast. Multiplanar CT image reconstructions of the cervical spine were also generated. COMPARISON:  09/06/2019 FINDINGS: CT HEAD FINDINGS Brain: No evidence of acute infarction, hemorrhage, hydrocephalus, extra-axial collection or mass lesion/mass effect.  There is mild diffuse low-attenuation within the subcortical and periventricular white matter compatible with chronic microvascular disease. Prominence of the sulci and ventricles are noted compatible with brain atrophy. Vascular: No hyperdense vessel or unexpected calcification. Skull: Normal. Negative for fracture or focal lesion. Sinuses/Orbits: The paranasal sinuses and mastoid air cells are clear. Other: None. CT CERVICAL SPINE FINDINGS Alignment: Normal. Skull base and vertebrae: The cervical vertebral body heights are well preserved. No fractures identified. Soft tissues and spinal canal: No prevertebral fluid or swelling. No visible canal hematoma. Disc levels: Status post ACDF of C4 through C6. Solid fusion of the C4-5 and C5-6 disc spaces noted. Moderate degenerative changes are noted at the C3-4 disc space and C6-7. Upper chest: Scattered  ground-glass densities are identified bilaterally. Decreased AP diameter of the trachea is identified compatible with tracheo malacia, image 83/6. Other: None IMPRESSION: 1. No acute intracranial abnormalities. 2. Chronic small vessel ischemic change and brain atrophy. 3. No evidence for cervical spine fracture or dislocation. 4. Status post ACDF of C4 through C6 with solid fusion of the C4-5 and C5-6 disc spaces. 5. Scattered ground-glass densities are identified bilaterally within the upper lobes. Findings are nonspecific but may reflect underlying inflammation. 6. Tracheobronchomalacia. Electronically Signed   By: Kerby Moors M.D.   On: 09/01/2020 16:25   DG Chest Port 1 View  Result Date: 09/01/2020 CLINICAL DATA:  Questionable sepsis. EXAM: PORTABLE CHEST 1 VIEW COMPARISON:  04/03/2018 FINDINGS: Heart size is normal. Aortic atherosclerosis. Interstitial and airspace densities within the right midlung and right base identified compatible with pneumonia. Mild platelike atelectasis noted in the left base. Thoracolumbar scoliosis with degenerative disc disease. Status post ACDF. IMPRESSION: 1. Right midlung and right base opacities compatible with pneumonia. Followup PA and lateral chest X-ray is recommended in 3-4 weeks following trial of antibiotic therapy to ensure resolution and exclude underlying malignancy. 2. Aortic atherosclerosis. Electronically Signed   By: Kerby Moors M.D.   On: 09/01/2020 14:19   Discharge Instructions: Discharge Instructions    Diet - low sodium heart healthy   Complete by: As directed    Increase activity slowly   Complete by: As directed       Signed: Madalyn Rob, MD 09/07/2020, 1:21 PM

## 2020-09-07 NOTE — Progress Notes (Signed)
Pt. Discharged in stable condition via car with family to be transported to [facility].

## 2020-09-07 NOTE — Progress Notes (Signed)
Report was called and given to [place] at [facility].

## 2020-09-07 NOTE — TOC Transition Note (Addendum)
Transition of Care [facility]) - CM/SW Discharge Note   Patient Details  Name: [name] [name] MRN: [id number] Date of Birth: 03-Jun-1941  [facility] CM/SW Contact:  Angelita Ingles, RN Phone Number: [phone]  09/07/2020, 2:17 PM   Clinical Narrative:    Patient to be discharged. Daughter will transport patient to [facility] via private vehicle. Daughter has been updated. No other needs noted at this time. Discharge summary has been sent to [facility]. TOC will sign off  Please call report to [place] Room # 127   Final next level of care: Skilled Nursing Facility Barriers to Discharge: No Barriers Identified   Patient Goals and CMS Choice Patient states their goals for this hospitalization and ongoing recovery are:: Ready to get better CMS Medicare.gov Compare Post Acute Care list provided to:: Patient Choice offered to / list presented to : Patient  Discharge Placement              Patient chooses bed at: [facility] Patient to be transferred to facility by: daughter private vehicle Name of family member notified: [name] Patient and family notified of of transfer: 09/07/20  Discharge Plan and Services                DME Arranged: N/A DME Agency: NA       HH Arranged: NA HH Agency: NA        Social Determinants of Health (SDOH) Interventions     Readmission Risk Interventions No flowsheet data found.

## 2020-09-08 DIAGNOSIS — M25562 Pain in left knee: Secondary | ICD-10-CM | POA: Diagnosis not present

## 2020-09-08 DIAGNOSIS — S51811A Laceration without foreign body of right forearm, initial encounter: Secondary | ICD-10-CM | POA: Diagnosis not present

## 2020-09-08 DIAGNOSIS — R600 Localized edema: Secondary | ICD-10-CM | POA: Diagnosis not present

## 2020-09-08 DIAGNOSIS — M25511 Pain in right shoulder: Secondary | ICD-10-CM | POA: Diagnosis not present

## 2020-09-09 LAB — METHYLMALONIC ACID, SERUM: Methylmalonic Acid, Quantitative: 350 nmol/L (ref 0–378)

## 2020-09-10 DIAGNOSIS — R300 Dysuria: Secondary | ICD-10-CM | POA: Diagnosis not present

## 2020-09-10 DIAGNOSIS — R600 Localized edema: Secondary | ICD-10-CM | POA: Diagnosis not present

## 2020-09-10 DIAGNOSIS — M25562 Pain in left knee: Secondary | ICD-10-CM | POA: Diagnosis not present

## 2020-09-10 DIAGNOSIS — N1831 Chronic kidney disease, stage 3a: Secondary | ICD-10-CM | POA: Diagnosis not present

## 2020-09-15 DIAGNOSIS — R5381 Other malaise: Secondary | ICD-10-CM | POA: Diagnosis not present

## 2020-09-15 DIAGNOSIS — M6281 Muscle weakness (generalized): Secondary | ICD-10-CM | POA: Diagnosis not present

## 2020-09-15 DIAGNOSIS — R262 Difficulty in walking, not elsewhere classified: Secondary | ICD-10-CM | POA: Diagnosis not present

## 2020-09-15 DIAGNOSIS — M25569 Pain in unspecified knee: Secondary | ICD-10-CM | POA: Diagnosis not present

## 2020-10-03 DIAGNOSIS — N179 Acute kidney failure, unspecified: Secondary | ICD-10-CM | POA: Diagnosis not present

## 2020-10-03 DIAGNOSIS — M4726 Other spondylosis with radiculopathy, lumbar region: Secondary | ICD-10-CM | POA: Diagnosis not present

## 2020-10-03 DIAGNOSIS — D539 Nutritional anemia, unspecified: Secondary | ICD-10-CM | POA: Diagnosis not present

## 2020-10-03 DIAGNOSIS — M25562 Pain in left knee: Secondary | ICD-10-CM | POA: Diagnosis not present

## 2020-10-03 DIAGNOSIS — M25561 Pain in right knee: Secondary | ICD-10-CM | POA: Diagnosis not present

## 2020-10-03 DIAGNOSIS — M353 Polymyalgia rheumatica: Secondary | ICD-10-CM | POA: Diagnosis not present

## 2020-10-03 DIAGNOSIS — Z981 Arthrodesis status: Secondary | ICD-10-CM | POA: Diagnosis not present

## 2020-10-03 DIAGNOSIS — Z7982 Long term (current) use of aspirin: Secondary | ICD-10-CM | POA: Diagnosis not present

## 2020-10-03 DIAGNOSIS — N183 Chronic kidney disease, stage 3 unspecified: Secondary | ICD-10-CM | POA: Diagnosis not present

## 2020-10-03 DIAGNOSIS — J189 Pneumonia, unspecified organism: Secondary | ICD-10-CM | POA: Diagnosis not present

## 2020-10-03 DIAGNOSIS — Q600 Renal agenesis, unilateral: Secondary | ICD-10-CM | POA: Diagnosis not present

## 2020-10-03 DIAGNOSIS — E785 Hyperlipidemia, unspecified: Secondary | ICD-10-CM | POA: Diagnosis not present

## 2020-10-03 DIAGNOSIS — K219 Gastro-esophageal reflux disease without esophagitis: Secondary | ICD-10-CM | POA: Diagnosis not present

## 2020-10-03 DIAGNOSIS — F32A Depression, unspecified: Secondary | ICD-10-CM | POA: Diagnosis not present

## 2020-10-03 DIAGNOSIS — D696 Thrombocytopenia, unspecified: Secondary | ICD-10-CM | POA: Diagnosis not present

## 2020-10-03 DIAGNOSIS — I739 Peripheral vascular disease, unspecified: Secondary | ICD-10-CM | POA: Diagnosis not present

## 2020-10-03 DIAGNOSIS — F419 Anxiety disorder, unspecified: Secondary | ICD-10-CM | POA: Diagnosis not present

## 2020-10-03 DIAGNOSIS — I129 Hypertensive chronic kidney disease with stage 1 through stage 4 chronic kidney disease, or unspecified chronic kidney disease: Secondary | ICD-10-CM | POA: Diagnosis not present

## 2020-10-03 DIAGNOSIS — Z7902 Long term (current) use of antithrombotics/antiplatelets: Secondary | ICD-10-CM | POA: Diagnosis not present

## 2020-10-03 DIAGNOSIS — A419 Sepsis, unspecified organism: Secondary | ICD-10-CM | POA: Diagnosis not present

## 2020-10-03 DIAGNOSIS — J9601 Acute respiratory failure with hypoxia: Secondary | ICD-10-CM | POA: Diagnosis not present

## 2020-10-03 DIAGNOSIS — M1990 Unspecified osteoarthritis, unspecified site: Secondary | ICD-10-CM | POA: Diagnosis not present

## 2020-10-07 DIAGNOSIS — Z9181 History of falling: Secondary | ICD-10-CM | POA: Diagnosis not present

## 2020-10-07 DIAGNOSIS — R202 Paresthesia of skin: Secondary | ICD-10-CM | POA: Diagnosis not present

## 2020-10-07 DIAGNOSIS — M545 Low back pain, unspecified: Secondary | ICD-10-CM | POA: Diagnosis not present

## 2020-10-07 DIAGNOSIS — Z79899 Other long term (current) drug therapy: Secondary | ICD-10-CM | POA: Diagnosis not present

## 2020-10-07 DIAGNOSIS — M79604 Pain in right leg: Secondary | ICD-10-CM | POA: Diagnosis not present

## 2020-10-07 DIAGNOSIS — Z7689 Persons encountering health services in other specified circumstances: Secondary | ICD-10-CM | POA: Diagnosis not present

## 2020-10-07 DIAGNOSIS — I10 Essential (primary) hypertension: Secondary | ICD-10-CM | POA: Diagnosis not present

## 2020-10-11 DIAGNOSIS — M4726 Other spondylosis with radiculopathy, lumbar region: Secondary | ICD-10-CM | POA: Diagnosis not present

## 2020-10-11 DIAGNOSIS — K219 Gastro-esophageal reflux disease without esophagitis: Secondary | ICD-10-CM | POA: Diagnosis not present

## 2020-10-11 DIAGNOSIS — D696 Thrombocytopenia, unspecified: Secondary | ICD-10-CM | POA: Diagnosis not present

## 2020-10-11 DIAGNOSIS — I129 Hypertensive chronic kidney disease with stage 1 through stage 4 chronic kidney disease, or unspecified chronic kidney disease: Secondary | ICD-10-CM | POA: Diagnosis not present

## 2020-10-11 DIAGNOSIS — I739 Peripheral vascular disease, unspecified: Secondary | ICD-10-CM | POA: Diagnosis not present

## 2020-10-11 DIAGNOSIS — J9601 Acute respiratory failure with hypoxia: Secondary | ICD-10-CM | POA: Diagnosis not present

## 2020-10-11 DIAGNOSIS — D539 Nutritional anemia, unspecified: Secondary | ICD-10-CM | POA: Diagnosis not present

## 2020-10-11 DIAGNOSIS — E785 Hyperlipidemia, unspecified: Secondary | ICD-10-CM | POA: Diagnosis not present

## 2020-10-11 DIAGNOSIS — M25561 Pain in right knee: Secondary | ICD-10-CM | POA: Diagnosis not present

## 2020-10-11 DIAGNOSIS — Z981 Arthrodesis status: Secondary | ICD-10-CM | POA: Diagnosis not present

## 2020-10-11 DIAGNOSIS — F419 Anxiety disorder, unspecified: Secondary | ICD-10-CM | POA: Diagnosis not present

## 2020-10-11 DIAGNOSIS — M353 Polymyalgia rheumatica: Secondary | ICD-10-CM | POA: Diagnosis not present

## 2020-10-11 DIAGNOSIS — Z7902 Long term (current) use of antithrombotics/antiplatelets: Secondary | ICD-10-CM | POA: Diagnosis not present

## 2020-10-11 DIAGNOSIS — N183 Chronic kidney disease, stage 3 unspecified: Secondary | ICD-10-CM | POA: Diagnosis not present

## 2020-10-11 DIAGNOSIS — M25562 Pain in left knee: Secondary | ICD-10-CM | POA: Diagnosis not present

## 2020-10-11 DIAGNOSIS — M1990 Unspecified osteoarthritis, unspecified site: Secondary | ICD-10-CM | POA: Diagnosis not present

## 2020-10-11 DIAGNOSIS — Q600 Renal agenesis, unilateral: Secondary | ICD-10-CM | POA: Diagnosis not present

## 2020-10-11 DIAGNOSIS — J189 Pneumonia, unspecified organism: Secondary | ICD-10-CM | POA: Diagnosis not present

## 2020-10-11 DIAGNOSIS — N179 Acute kidney failure, unspecified: Secondary | ICD-10-CM | POA: Diagnosis not present

## 2020-10-11 DIAGNOSIS — Z7982 Long term (current) use of aspirin: Secondary | ICD-10-CM | POA: Diagnosis not present

## 2020-10-11 DIAGNOSIS — A419 Sepsis, unspecified organism: Secondary | ICD-10-CM | POA: Diagnosis not present

## 2020-10-11 DIAGNOSIS — F32A Depression, unspecified: Secondary | ICD-10-CM | POA: Diagnosis not present

## 2020-10-23 ENCOUNTER — Emergency Department (HOSPITAL_COMMUNITY): Payer: PPO

## 2020-10-23 ENCOUNTER — Inpatient Hospital Stay (HOSPITAL_COMMUNITY)
Admission: EM | Admit: 2020-10-23 | Discharge: 2020-11-05 | DRG: 871 | Disposition: A | Discharge status: Skilled Nursing Facility | Payer: PPO | Attending: Internal Medicine | Admitting: Internal Medicine

## 2020-10-23 ENCOUNTER — Other Ambulatory Visit: Payer: Self-pay

## 2020-10-23 ENCOUNTER — Encounter (HOSPITAL_COMMUNITY): Payer: Self-pay

## 2020-10-23 DIAGNOSIS — I739 Peripheral vascular disease, unspecified: Secondary | ICD-10-CM | POA: Diagnosis present

## 2020-10-23 DIAGNOSIS — I340 Nonrheumatic mitral (valve) insufficiency: Secondary | ICD-10-CM | POA: Diagnosis not present

## 2020-10-23 DIAGNOSIS — K429 Umbilical hernia without obstruction or gangrene: Secondary | ICD-10-CM | POA: Diagnosis not present

## 2020-10-23 DIAGNOSIS — M47819 Spondylosis without myelopathy or radiculopathy, site unspecified: Secondary | ICD-10-CM | POA: Diagnosis not present

## 2020-10-23 DIAGNOSIS — K219 Gastro-esophageal reflux disease without esophagitis: Secondary | ICD-10-CM | POA: Diagnosis not present

## 2020-10-23 DIAGNOSIS — M6281 Muscle weakness (generalized): Secondary | ICD-10-CM | POA: Diagnosis not present

## 2020-10-23 DIAGNOSIS — Z20822 Contact with and (suspected) exposure to covid-19: Secondary | ICD-10-CM | POA: Diagnosis present

## 2020-10-23 DIAGNOSIS — I10 Essential (primary) hypertension: Secondary | ICD-10-CM | POA: Diagnosis not present

## 2020-10-23 DIAGNOSIS — E876 Hypokalemia: Secondary | ICD-10-CM | POA: Diagnosis present

## 2020-10-23 DIAGNOSIS — F332 Major depressive disorder, recurrent severe without psychotic features: Secondary | ICD-10-CM

## 2020-10-23 DIAGNOSIS — S81832D Puncture wound without foreign body, left lower leg, subsequent encounter: Secondary | ICD-10-CM | POA: Diagnosis not present

## 2020-10-23 DIAGNOSIS — R7989 Other specified abnormal findings of blood chemistry: Secondary | ICD-10-CM

## 2020-10-23 DIAGNOSIS — R079 Chest pain, unspecified: Secondary | ICD-10-CM | POA: Diagnosis not present

## 2020-10-23 DIAGNOSIS — R531 Weakness: Secondary | ICD-10-CM

## 2020-10-23 DIAGNOSIS — R7881 Bacteremia: Secondary | ICD-10-CM | POA: Diagnosis not present

## 2020-10-23 DIAGNOSIS — Z6830 Body mass index (BMI) 30.0-30.9, adult: Secondary | ICD-10-CM

## 2020-10-23 DIAGNOSIS — I872 Venous insufficiency (chronic) (peripheral): Secondary | ICD-10-CM | POA: Diagnosis not present

## 2020-10-23 DIAGNOSIS — D696 Thrombocytopenia, unspecified: Secondary | ICD-10-CM | POA: Diagnosis not present

## 2020-10-23 DIAGNOSIS — Z87891 Personal history of nicotine dependence: Secondary | ICD-10-CM

## 2020-10-23 DIAGNOSIS — M353 Polymyalgia rheumatica: Secondary | ICD-10-CM | POA: Diagnosis not present

## 2020-10-23 DIAGNOSIS — E782 Mixed hyperlipidemia: Secondary | ICD-10-CM | POA: Diagnosis not present

## 2020-10-23 DIAGNOSIS — I081 Rheumatic disorders of both mitral and tricuspid valves: Secondary | ICD-10-CM | POA: Diagnosis not present

## 2020-10-23 DIAGNOSIS — I129 Hypertensive chronic kidney disease with stage 1 through stage 4 chronic kidney disease, or unspecified chronic kidney disease: Secondary | ICD-10-CM | POA: Diagnosis not present

## 2020-10-23 DIAGNOSIS — A4902 Methicillin resistant Staphylococcus aureus infection, unspecified site: Secondary | ICD-10-CM | POA: Diagnosis not present

## 2020-10-23 DIAGNOSIS — N1832 Chronic kidney disease, stage 3b: Secondary | ICD-10-CM | POA: Diagnosis present

## 2020-10-23 DIAGNOSIS — R627 Adult failure to thrive: Secondary | ICD-10-CM

## 2020-10-23 DIAGNOSIS — Z79899 Other long term (current) drug therapy: Secondary | ICD-10-CM

## 2020-10-23 DIAGNOSIS — N261 Atrophy of kidney (terminal): Secondary | ICD-10-CM | POA: Diagnosis not present

## 2020-10-23 DIAGNOSIS — E871 Hypo-osmolality and hyponatremia: Secondary | ICD-10-CM | POA: Diagnosis present

## 2020-10-23 DIAGNOSIS — Z22322 Carrier or suspected carrier of Methicillin resistant Staphylococcus aureus: Secondary | ICD-10-CM

## 2020-10-23 DIAGNOSIS — Z8249 Family history of ischemic heart disease and other diseases of the circulatory system: Secondary | ICD-10-CM

## 2020-10-23 DIAGNOSIS — E041 Nontoxic single thyroid nodule: Secondary | ICD-10-CM | POA: Diagnosis present

## 2020-10-23 DIAGNOSIS — Z209 Contact with and (suspected) exposure to unspecified communicable disease: Secondary | ICD-10-CM | POA: Diagnosis not present

## 2020-10-23 DIAGNOSIS — E669 Obesity, unspecified: Secondary | ICD-10-CM | POA: Diagnosis not present

## 2020-10-23 DIAGNOSIS — Z6834 Body mass index (BMI) 34.0-34.9, adult: Secondary | ICD-10-CM | POA: Diagnosis not present

## 2020-10-23 DIAGNOSIS — D539 Nutritional anemia, unspecified: Secondary | ICD-10-CM | POA: Diagnosis not present

## 2020-10-23 DIAGNOSIS — Z7722 Contact with and (suspected) exposure to environmental tobacco smoke (acute) (chronic): Secondary | ICD-10-CM | POA: Diagnosis present

## 2020-10-23 DIAGNOSIS — Z66 Do not resuscitate: Secondary | ICD-10-CM | POA: Diagnosis not present

## 2020-10-23 DIAGNOSIS — E860 Dehydration: Secondary | ICD-10-CM | POA: Diagnosis not present

## 2020-10-23 DIAGNOSIS — R41841 Cognitive communication deficit: Secondary | ICD-10-CM | POA: Diagnosis not present

## 2020-10-23 DIAGNOSIS — L409 Psoriasis, unspecified: Secondary | ICD-10-CM | POA: Diagnosis present

## 2020-10-23 DIAGNOSIS — B9562 Methicillin resistant Staphylococcus aureus infection as the cause of diseases classified elsewhere: Secondary | ICD-10-CM | POA: Diagnosis not present

## 2020-10-23 DIAGNOSIS — N179 Acute kidney failure, unspecified: Secondary | ICD-10-CM | POA: Diagnosis not present

## 2020-10-23 DIAGNOSIS — I5030 Unspecified diastolic (congestive) heart failure: Secondary | ICD-10-CM | POA: Diagnosis not present

## 2020-10-23 DIAGNOSIS — Z888 Allergy status to other drugs, medicaments and biological substances status: Secondary | ICD-10-CM

## 2020-10-23 DIAGNOSIS — R0602 Shortness of breath: Secondary | ICD-10-CM | POA: Diagnosis not present

## 2020-10-23 DIAGNOSIS — F419 Anxiety disorder, unspecified: Secondary | ICD-10-CM

## 2020-10-23 DIAGNOSIS — Q600 Renal agenesis, unilateral: Secondary | ICD-10-CM | POA: Diagnosis not present

## 2020-10-23 DIAGNOSIS — H401133 Primary open-angle glaucoma, bilateral, severe stage: Secondary | ICD-10-CM | POA: Diagnosis not present

## 2020-10-23 DIAGNOSIS — C4491 Basal cell carcinoma of skin, unspecified: Secondary | ICD-10-CM | POA: Diagnosis not present

## 2020-10-23 DIAGNOSIS — S81832A Puncture wound without foreign body, left lower leg, initial encounter: Secondary | ICD-10-CM

## 2020-10-23 DIAGNOSIS — F411 Generalized anxiety disorder: Secondary | ICD-10-CM | POA: Diagnosis not present

## 2020-10-23 DIAGNOSIS — M5416 Radiculopathy, lumbar region: Secondary | ICD-10-CM | POA: Diagnosis present

## 2020-10-23 DIAGNOSIS — Z8701 Personal history of pneumonia (recurrent): Secondary | ICD-10-CM

## 2020-10-23 DIAGNOSIS — N183 Chronic kidney disease, stage 3 unspecified: Secondary | ICD-10-CM | POA: Diagnosis not present

## 2020-10-23 DIAGNOSIS — R339 Retention of urine, unspecified: Secondary | ICD-10-CM | POA: Diagnosis present

## 2020-10-23 DIAGNOSIS — Z743 Need for continuous supervision: Secondary | ICD-10-CM | POA: Diagnosis not present

## 2020-10-23 DIAGNOSIS — L0292 Furuncle, unspecified: Secondary | ICD-10-CM | POA: Diagnosis present

## 2020-10-23 DIAGNOSIS — J189 Pneumonia, unspecified organism: Secondary | ICD-10-CM | POA: Diagnosis not present

## 2020-10-23 DIAGNOSIS — N189 Chronic kidney disease, unspecified: Secondary | ICD-10-CM | POA: Diagnosis not present

## 2020-10-23 DIAGNOSIS — R0689 Other abnormalities of breathing: Secondary | ICD-10-CM | POA: Diagnosis not present

## 2020-10-23 DIAGNOSIS — R040 Epistaxis: Secondary | ICD-10-CM | POA: Diagnosis not present

## 2020-10-23 DIAGNOSIS — R338 Other retention of urine: Secondary | ICD-10-CM | POA: Diagnosis not present

## 2020-10-23 DIAGNOSIS — R0789 Other chest pain: Secondary | ICD-10-CM | POA: Diagnosis not present

## 2020-10-23 DIAGNOSIS — R5381 Other malaise: Secondary | ICD-10-CM | POA: Diagnosis not present

## 2020-10-23 DIAGNOSIS — J9601 Acute respiratory failure with hypoxia: Secondary | ICD-10-CM | POA: Diagnosis not present

## 2020-10-23 DIAGNOSIS — Z7902 Long term (current) use of antithrombotics/antiplatelets: Secondary | ICD-10-CM

## 2020-10-23 DIAGNOSIS — J9811 Atelectasis: Secondary | ICD-10-CM | POA: Diagnosis not present

## 2020-10-23 DIAGNOSIS — E66811 Obesity, class 1: Secondary | ICD-10-CM

## 2020-10-23 DIAGNOSIS — R911 Solitary pulmonary nodule: Secondary | ICD-10-CM | POA: Diagnosis not present

## 2020-10-23 DIAGNOSIS — R059 Cough, unspecified: Secondary | ICD-10-CM | POA: Diagnosis not present

## 2020-10-23 DIAGNOSIS — R509 Fever, unspecified: Secondary | ICD-10-CM | POA: Diagnosis not present

## 2020-10-23 DIAGNOSIS — G4733 Obstructive sleep apnea (adult) (pediatric): Secondary | ICD-10-CM | POA: Diagnosis not present

## 2020-10-23 DIAGNOSIS — Z90710 Acquired absence of both cervix and uterus: Secondary | ICD-10-CM

## 2020-10-23 DIAGNOSIS — A4102 Sepsis due to Methicillin resistant Staphylococcus aureus: Secondary | ICD-10-CM | POA: Diagnosis not present

## 2020-10-23 DIAGNOSIS — F32A Depression, unspecified: Secondary | ICD-10-CM | POA: Diagnosis present

## 2020-10-23 DIAGNOSIS — R609 Edema, unspecified: Secondary | ICD-10-CM | POA: Diagnosis not present

## 2020-10-23 DIAGNOSIS — R0902 Hypoxemia: Secondary | ICD-10-CM | POA: Diagnosis not present

## 2020-10-23 DIAGNOSIS — R279 Unspecified lack of coordination: Secondary | ICD-10-CM | POA: Diagnosis not present

## 2020-10-23 HISTORY — DX: Other specified abnormal findings of blood chemistry: R79.89

## 2020-10-23 HISTORY — DX: Shortness of breath: R06.02

## 2020-10-23 HISTORY — DX: Sleep apnea, unspecified: G47.30

## 2020-10-23 LAB — TROPONIN I (HIGH SENSITIVITY)
Troponin I (High Sensitivity): 7 ng/L (ref <18)
Troponin I (High Sensitivity): 7 ng/L (ref <18)

## 2020-10-23 LAB — BRAIN NATRIURETIC PEPTIDE: B Natriuretic Peptide: 18.8 pg/mL (ref 0.0–100.0)

## 2020-10-23 LAB — CBC WITH DIFFERENTIAL/PLATELET
Abs Immature Granulocytes: 0.03 10*3/uL (ref 0.00–0.07)
Basophils Absolute: 0 10*3/uL (ref 0.0–0.1)
Basophils Relative: 0 %
Eosinophils Absolute: 0.2 10*3/uL (ref 0.0–0.5)
Eosinophils Relative: 2 %
HCT: 37.8 % (ref 36.0–46.0)
Hemoglobin: 12.9 g/dL (ref 12.0–15.0)
Immature Granulocytes: 0 %
Lymphocytes Relative: 19 %
Lymphs Abs: 1.7 10*3/uL (ref 0.7–4.0)
MCH: 34.4 pg — ABNORMAL HIGH (ref 26.0–34.0)
MCHC: 34.1 g/dL (ref 30.0–36.0)
MCV: 100.8 fL — ABNORMAL HIGH (ref 80.0–100.0)
Monocytes Absolute: 1 10*3/uL (ref 0.1–1.0)
Monocytes Relative: 11 %
Neutro Abs: 6.3 10*3/uL (ref 1.7–7.7)
Neutrophils Relative %: 68 %
Platelets: 198 10*3/uL (ref 150–400)
RBC: 3.75 MIL/uL — ABNORMAL LOW (ref 3.87–5.11)
RDW: 13.8 % (ref 11.5–15.5)
WBC: 9.3 10*3/uL (ref 4.0–10.5)
nRBC: 0 % (ref 0.0–0.2)

## 2020-10-23 LAB — LACTIC ACID, PLASMA
Lactic Acid, Venous: 1.1 mmol/L (ref 0.5–1.9)
Lactic Acid, Venous: 0.8 mmol/L (ref 0.5–1.9)

## 2020-10-23 LAB — BASIC METABOLIC PANEL
Anion gap: 10 (ref 5–15)
BUN: 16 mg/dL (ref 8–23)
CO2: 31 mmol/L (ref 22–32)
Calcium: 9.1 mg/dL (ref 8.9–10.3)
Chloride: 97 mmol/L — ABNORMAL LOW (ref 98–111)
Creatinine, Ser: 1.52 mg/dL — ABNORMAL HIGH (ref 0.44–1.00)
GFR, Estimated: 34 mL/min — ABNORMAL LOW (ref >60)
Glucose, Bld: 87 mg/dL (ref 70–99)
Potassium: 3.6 mmol/L (ref 3.5–5.1)
Sodium: 138 mmol/L (ref 135–145)

## 2020-10-23 LAB — D-DIMER, QUANTITATIVE: D-Dimer, Quant: 1.04 ug/mL-FEU — ABNORMAL HIGH (ref 0.00–0.50)

## 2020-10-23 MED ORDER — AMLODIPINE BESYLATE 5 MG PO TABS
5.0000 mg | ORAL_TABLET | Freq: Every day | ORAL | Status: DC
  Administered 2020-10-24 – 2020-10-31 (×7): 5 mg via ORAL
  Filled 2020-10-23 (×9): qty 1

## 2020-10-23 MED ORDER — ENOXAPARIN SODIUM 60 MG/0.6ML ~~LOC~~ SOLN
60.0000 mg | Freq: Once | SUBCUTANEOUS | Status: AC
  Administered 2020-10-23: 60 mg via SUBCUTANEOUS
  Filled 2020-10-23 (×2): qty 0.6

## 2020-10-23 MED ORDER — ASPIRIN 81 MG PO CHEW
81.0000 mg | CHEWABLE_TABLET | Freq: Every day | ORAL | Status: DC
  Administered 2020-10-24 – 2020-11-05 (×13): 81 mg via ORAL
  Filled 2020-10-23 (×13): qty 1

## 2020-10-23 MED ORDER — BUSPIRONE HCL 10 MG PO TABS
15.0000 mg | ORAL_TABLET | Freq: Three times a day (TID) | ORAL | Status: DC
  Administered 2020-10-23 – 2020-11-05 (×38): 15 mg via ORAL
  Filled 2020-10-23 (×38): qty 2

## 2020-10-23 MED ORDER — VITAMIN B-12 100 MCG PO TABS
100.0000 ug | ORAL_TABLET | Freq: Every day | ORAL | Status: DC
  Administered 2020-10-24 – 2020-11-05 (×13): 100 ug via ORAL
  Filled 2020-10-23 (×13): qty 1

## 2020-10-23 MED ORDER — ALBUTEROL SULFATE HFA 108 (90 BASE) MCG/ACT IN AERS
2.0000 | INHALATION_SPRAY | Freq: Four times a day (QID) | RESPIRATORY_TRACT | Status: DC | PRN
  Filled 2020-10-23: qty 6.7

## 2020-10-23 MED ORDER — MIRTAZAPINE 15 MG PO TABS
15.0000 mg | ORAL_TABLET | Freq: Every day | ORAL | Status: DC
  Administered 2020-10-23 – 2020-11-04 (×13): 15 mg via ORAL
  Filled 2020-10-23 (×13): qty 1

## 2020-10-23 MED ORDER — SODIUM CHLORIDE 0.9 % IV SOLN: Freq: Once | INTRAVENOUS | Status: AC

## 2020-10-23 MED ORDER — PANTOPRAZOLE SODIUM 40 MG PO TBEC
40.0000 mg | DELAYED_RELEASE_TABLET | Freq: Every day | ORAL | Status: DC
  Administered 2020-10-24 – 2020-11-05 (×13): 40 mg via ORAL
  Filled 2020-10-23 (×14): qty 1

## 2020-10-23 NOTE — ED Triage Notes (Signed)
Per EMS: Pt hospitalized not too long ago with PNA. Home health nurse stated PT was dropping her O2 sats when ambulating and had an irregular heart beat. EMS found pt to be at 94% on room air and normal sinus rhythm. 20g right forearm with 470mL NS IVF given.

## 2020-10-23 NOTE — ED Notes (Signed)
Pt O2 maintaining around 86% while awake and lying in the bed. Placed on 2L [place] pt O2 up to 96%.

## 2020-10-23 NOTE — Progress Notes (Signed)
ANTICOAGULATION CONSULT NOTE - Initial Consult  Pharmacy Consult for enoxaparin x1 dose Indication: VTE treatment pending VQ scan   Allergies  Allergen Reactions  . Statins   . Gabapentin Itching    Was deleted since patient was taking, but now experiencing itching again.   . Statins Swelling, Rash and Other (See Comments)    Swelling involving tongue; also causes muscle pain    Patient Measurements: Weight 71.1kg Height 152.4 cm  Vital Signs: Temp: 98.1 F (36.7 C) (04/16 2040) Temp Source: Oral (04/16 1920) BP: 138/73 (04/16 2040) Pulse Rate: 90 (04/16 2040)  Labs: Recent Labs    10/23/20 1426 10/23/20 1609  HGB 12.9  --   HCT 37.8  --   PLT 198  --   CREATININE 1.52*  --   TROPONINIHS 7 7    Estimated Creatinine Clearance: 25.5 mL/min (A) (by C-G formula based on SCr of 1.52 mg/dL (H)).   Medical History: Past Medical History:  Diagnosis Date  . Anxiety   . Arthritis    RA  . Carpal tunnel syndrome, bilateral   . Chronic kidney disease    CKD stage III, absence of left kidney  . CKD (chronic kidney disease) stage 3, GFR 30-59 ml/min (HCC) 09/06/2019  . Congenital absence of one kidney    Pt has right kidney only  . Depression   . Dyslipidemia 09/06/2019  . Essential hypertension 09/06/2019  . GERD (gastroesophageal reflux disease)   . Headache   . Heart murmur   . Hypercholesteremia   . Hypertension   . Peripheral vascular disease ([place])    a. s/p R external iliac stent '06 with angioplasty '13 (Dr. Benjamine Sprague) b. 01/2016: s/p PTA/stenting in L common iliac artery (Dr. Gwenlyn Found)  . Polymyalgia rheumatica ([place])   . Psoriasis   . Tremor 09/06/2019    Assessment: 80 yo W admitted for AMS in the setting of pneumonia. No anticoagulation prior to admission. Pharmacy consulted for one enoxaparin treatment dose pending VQ scan to rule out PE.   ClCr ~ 69ml/min so will dose adjust enoxaparin treatment dose to 0.85mg /kg x1 dose. H/H, plt wnl.   Goal of Therapy:   Monitor platelets by anticoagulation protocol: Yes   Plan:  Enoxaparin 60mg  x1 only Monitor for signs/symptoms of bleeding  F/u VQ scan   Benetta Spar, PharmD, BCPS, BCCP Clinical Pharmacist  Please check AMION for all [place] phone numbers After 10:00 PM, call [place] [phone]

## 2020-10-23 NOTE — H&P (Signed)
History and Physical  [name] [name] XHB:[id number] DOB: [birth year] DOA: 10/23/2020  Referring physician: Reita Chard PCP: [name], MD  Patient coming from: Home  Chief Complaint: Generalized weakness and shortness of breath  HPI: [name] [name] is a 80 y.o. female with medical history significant for CKD 3B, congenital solitary kidney hypertension, hyperlipidemia and peripheral vascular disease who presents to the emergency department due to generalized weakness and shortness of breath.  Patient states that she was admitted to this hospital in February due to pneumonia and was then discharged to a skilled nursing facility, she states that she was discharged from the nursing facility about 1 month ago.  Patient complained of cough and shortness of breath when she lays flat and that she occasionally coughs up a blackish sputum.  Patient states that she has not been eating and drinking enough over the past few weeks and that her blood pressure usually  increase while shortness of breath worsens when she stands.  Patient complained of rashes on both legs which appears to be dried blister wounds and bilateral erythematous rash of similar pattern (chronic venous stasis dermatitis.  Patient also complained of mild epigastric pain which worsens when she eats.  She lives alone, but daughter lives about 3 houses from her and was always around to assist patient. Recent hospitalization: 09/01/2020 to 09/10/2020 Sepsis with acute hypoxic respiratory failure due to pneumonia and altered mental status.  She was treated with 5 days of antibiotics with MRSA coverage and was discharged to a SNF  ED Course:  In the emergency department, she was hemodynamically stable.  Work-up done in the ED showed elevated MCV (100.8), BUN/creatinine 16/1.52 (baseline creatinine at 0.9-1.3), D-dimer 1.04, troponin x2 was flat at 7, BNP 18.8, lactic acid 0.8 Chest x-ray showed resolution of previously seen right basilar  pneumonia Hospitalist was asked to admit patient for further evaluation and management.   Review of Systems: Constitutional: Negative for chills and fever.  HENT: Negative for ear pain and sore throat.   Eyes: Negative for pain and visual disturbance.  Respiratory: Positive for cough and shortness of breath.   Cardiovascular: Negative for chest pain and palpitations.  Gastrointestinal: Negative for abdominal pain and vomiting.  Endocrine: Negative for polyphagia and polyuria.  Genitourinary: Negative for decreased urine volume, dysuria, enuresis Musculoskeletal: Negative for arthralgias and back pain.  Skin: Negative for color change and rash.  Allergic/Immunologic: Negative for immunocompromised state.  Neurological: Positive for weakness.  Negative for tremors, syncope, speech difficulty Hematological: Does not bruise/bleed easily.  All other systems reviewed and are negative   Past Medical History:  Diagnosis Date  . Anxiety   . Arthritis    RA  . Carpal tunnel syndrome, bilateral   . Chronic kidney disease    CKD stage III, absence of left kidney  . CKD (chronic kidney disease) stage 3, GFR 30-59 ml/min (HCC) 09/06/2019  . Congenital absence of one kidney    Pt has right kidney only  . Depression   . Dyslipidemia 09/06/2019  . Essential hypertension 09/06/2019  . GERD (gastroesophageal reflux disease)   . Headache   . Heart murmur   . Hypercholesteremia   . Hypertension   . Peripheral vascular disease ([place])    a. s/p R external iliac stent '06 with angioplasty '13 (Dr. Benjamine Sprague) b. 01/2016: s/p PTA/stenting in L common iliac artery (Dr. Gwenlyn Found)  . Polymyalgia rheumatica ([place])   . Psoriasis   . Tremor 09/06/2019   Past Surgical  History:  Procedure Laterality Date  . ABDOMINAL HYSTERECTOMY  1963   Partial,  Due to bleeding after delivery  . ANTERIOR CERVICAL DECOMP/DISCECTOMY FUSION  04/19/2012   Procedure: ANTERIOR CERVICAL DECOMPRESSION/DISCECTOMY FUSION 2 LEVELS;   Surgeon: Winfield Cunas, MD;  Location: [facility];  Service: Neurosurgery;  Laterality: N/A;  Cervical four-five,Cervical five-six anterior cervical decompression with fusion plating and bonegraft possible posterior cervical decompression  . APPENDECTOMY    . CARPAL TUNNEL RELEASE  1990  . CERVICAL FUSION  04/19/2012  . EYE SURGERY    . ILIAC ARTERY STENT    . PERIPHERAL VASCULAR CATHETERIZATION N/A 01/31/2016   Procedure: Abdominal Aortogram;  Surgeon: [name], MD;  Location: [facility];  Service: Cardiovascular;  Laterality: N/A;  . PERIPHERAL VASCULAR CATHETERIZATION Bilateral 01/31/2016   Procedure: Lower Extremity Angiography;  Surgeon: [name], MD;  Location: [facility];  Service: Cardiovascular;  Laterality: Bilateral;  . PERIPHERAL VASCULAR CATHETERIZATION Left 01/31/2016   Procedure: Peripheral Vascular Intervention;  Surgeon: [name], MD;  Location: [facility];  Service: Cardiovascular;  Laterality: Left;  ILIAC  . TONSILLECTOMY      Social History:  reports that she quit smoking about 10 years ago. Her smoking use included cigarettes. She has a 40.00 pack-year smoking history. She has never used smokeless tobacco. She reports that she does not drink alcohol and does not use drugs.   Allergies  Allergen Reactions  . Statins   . Gabapentin Itching    Was deleted since patient was taking, but now experiencing itching again.   . Statins Swelling, Rash and Other (See Comments)    Swelling involving tongue; also causes muscle pain    Family History  Problem Relation Age of Onset  . Angina Mother   . Heart attack Father   . Melanoma Brother   . Cancer Sister        Lymphoma     Prior to Admission medications   Medication Sig Start Date End Date Taking? Authorizing Provider  acetaminophen (TYLENOL) 500 MG tablet Take 500 mg by mouth every 8 (eight) hours as needed for moderate pain.   Yes [provider]  albuterol  (VENTOLIN HFA) 108 (90 Base) MCG/ACT inhaler Inhale 2 puffs into the lungs every 6 (six) hours as needed for cough, wheezing or shortness of breath. 07/22/19  Yes [provider]  amLODipine (NORVASC) 5 MG tablet TAKE 1 TABLET(5 MG) BY MOUTH DAILY Patient taking differently: Take 5 mg by mouth daily. 10/15/19  Yes Nicolette Bang, DO  Ascorbic Acid (VITAMIN C PO) Take 1 tablet by mouth daily.   Yes [provider]  busPIRone (BUSPAR) 15 MG tablet Take 1 tablet (15 mg total) by mouth 3 (three) times daily. 11/27/19  Yes Nicolette Bang, DO  cholecalciferol (VITAMIN D) 1000 units tablet Take 1,000 Units by mouth daily.   Yes [provider]  clopidogrel (PLAVIX) 75 MG tablet Take 1 tablet (75 mg total) by mouth every morning. 02/18/18  Yes [name], MD  COSOPT PF 2-0.5 % SOLN ophthalmic solution Place 1 drop into both eyes 2 (two) times daily. 07/22/19  Yes [provider]  Cyanocobalamin (VITAMIN B 12 PO) Take 1 tablet by mouth daily. 12/25/12  Yes [provider]  divalproex (DEPAKOTE) 125 MG DR tablet Take 125 mg by mouth 2 (two) times daily. 08/12/19  Yes [provider]  Dorzolamide HCl-Timolol Mal PF 2-0.5 % SOLN Place  1 drop into both eyes 2 (two) times daily. 04/30/19  Yes [provider]  ferrous sulfate 325 (65 FE) MG tablet Take 325 mg by mouth daily. 04/02/19  Yes Haydee Salter, NP  furosemide (LASIX) 20 MG tablet Take 20 mg by mouth every other day as needed. 09/28/20  Yes [provider]  gabapentin (NEURONTIN) 100 MG capsule Take 100 mg by mouth 3 (three) times daily.   Yes [provider]  Latanoprostene Bunod 0.024 % SOLN Place 1 drop into both eyes at bedtime. 04/30/19  Yes [provider]  mirtazapine (REMERON) 15 MG tablet Take 1 tablet (15 mg total) by mouth at bedtime. 09/11/18  Yes Hensel, Jamal Collin, MD  nystatin (MYCOSTATIN/NYSTOP) powder Apply 1 application topically 3 (three)  times daily.   Yes [provider]  pantoprazole (PROTONIX) 40 MG tablet Take 40 mg by mouth daily. 08/21/19  Yes [provider]  traMADol (ULTRAM) 50 MG tablet Take 50 mg by mouth 3 (three) times daily as needed for moderate pain.   Yes [provider]  VYZULTA 0.024 % SOLN Apply 1 drop to eye at bedtime. 07/30/19  Yes [provider]  aspirin EC 81 MG EC tablet Take 1 tablet (81 mg total) by mouth daily. Patient not taking: No sig reported 02/01/16   Eileen Stanford, PA-C    Physical Exam: BP 138/73 (BP Location: Left Arm)   Pulse 90   Temp 98.1 F (36.7 C)   Resp 16   Wt 71.1 kg   SpO2 100%   BMI 30.61 kg/m   . General: 80 y.o. year-old female well developed well nourished in no acute distress.  Alert and oriented x3. [name] HEENT: Dry mucous membrane.  NCAT, EOMI . Neck: Supple, trachea medial . Cardiovascular: Regular rate and rhythm with no rubs or gallops.  No thyromegaly or JVD noted.  2/4 pulses in all 4 extremities. [name] Respiratory: Clear to auscultation with no wheezes or rales. Good inspiratory effort. . Abdomen: Soft nontender nondistended with normal bowel sounds x4 quadrants. . Muskuloskeletal: No cyanosis, clubbing or edema noted bilaterally . Neuro: CN II-XII intact, sensation, reflexes intact . Skin: No ulcerative lesions noted or rashes . Psychiatry: Mood is appropriate for condition and setting          Labs on Admission:  Basic Metabolic Panel: Recent Labs  Lab 10/23/20 1426  NA 138  K 3.6  CL 97*  CO2 31  GLUCOSE 87  BUN 16  CREATININE 1.52*  CALCIUM 9.1   Liver Function Tests: No results for input(s): AST, ALT, ALKPHOS, BILITOT, PROT, ALBUMIN in the last 168 hours. No results for input(s): LIPASE, AMYLASE in the last 168 hours. No results for input(s): AMMONIA in the last 168 hours. CBC: Recent Labs  Lab 10/23/20 1426  WBC 9.3  NEUTROABS 6.3  HGB 12.9  HCT 37.8  MCV 100.8*  PLT 198   Cardiac  Enzymes: No results for input(s): CKTOTAL, CKMB, CKMBINDEX, TROPONINI in the last 168 hours.  BNP (last 3 results) Recent Labs    10/23/20 1426  BNP 18.8    ProBNP (last 3 results) No results for input(s): PROBNP in the last 8760 hours.  CBG: No results for input(s): GLUCAP in the last 168 hours.  Radiological Exams on Admission: DG Chest Portable 1 View  Result Date: 10/23/2020 CLINICAL DATA:  Cough and shortness of breath EXAM: PORTABLE CHEST 1 VIEW COMPARISON:  09/01/2020 FINDINGS: Cardiac shadow is stable. Previously seen right basilar  infiltrate has resolved in the interval. No new focal infiltrate is seen. No bony abnormality is noted. Stable postsurgical changes in the cervical spine are noted. IMPRESSION: Resolution of previously seen right basilar pneumonia. Electronically Signed   By: Inez Catalina M.D.   On: 10/23/2020 15:26    EKG: I independently viewed the EKG done and my findings are as followed: Sinus tachycardia at rate of 101 bpm  Assessment/Plan Present on Admission: . Shortness of breath . Stasis dermatitis of both legs . Essential hypertension . GERD (gastroesophageal reflux disease) . Mixed hyperlipidemia  Active Problems:   Anxiety   Mixed hyperlipidemia   GERD (gastroesophageal reflux disease)   Depression   Dehydration   Stasis dermatitis of both legs   Generalized weakness   AKI (acute kidney injury) ([place])   Essential hypertension   Acute kidney injury superimposed on chronic kidney disease (HCC)   Shortness of breath   Elevated d-dimer   Failure to thrive in adult   Obesity (BMI 30.0-34.9)  Shortness of breath possibly secondary to multifactorial Patient maintains normal O2 sats on room air, she complains of shortness of breath on exertion Symptoms are related to patient's elevated D-dimer (pending PE rule out), but this may be due to patient's deconditioning and inactivity while still recuperating from recent pneumonia. Continue with  insulin every 6 hours as needed for wheezing and shortness of breath Continue incentive spirometry Continue monitoring patient and provide supplemental oxygen as needed Continue PT/OT eval and treat  Generalized weakness in the setting of failure to thrive in adult Patient has had poor and oral intake since returning from SNF Due to encourage diet and check albumin level Continue fall precaution and neurochecks Continue PT/OT eval and treat  Acute kidney injury superimposed on CKD stage IIIb/dehydration BUN/creatinine 16/1.52 (baseline creatinine at 0.9-1.3) Continue gentle hydration Renally adjust medications, avoid nephrotoxic agents/dehydration/hypotension  Elevated D-dimer r/o PE D-dimer 1.04, CT angiography of chest cannot be done due to patient's acute kidney injury on CKD with history of solitary kidney. Therapeutic Lovenox was given pending VQ scan in the morning  Stasis dermatitis of both legs Stable, continue wound care  Essential hypertension Continue amlodipine Lasix temporarily held due to AKI on CKD  Macrocytic anemia Continue vitamin B12  GERD Continue Protonix  Depression and anxiety Continue buspirone and Remeron  Obesity(BMI 30.61) Patient will be counseled on diet and nursing medication    DVT prophylaxis: Lovenox  Code Status: DNR  Family Communication: None at bedside  Disposition Plan:  Patient is from:                        home Anticipated DC to:                   SNF or family members home Anticipated DC date:               2-3 days Anticipated DC barriers:           Patient is unstable to be discharged at this time due to shortness of breath, dehydration and acute kidney injury requiring IV hydration   Consults called: None  Admission status: Inpatient    [name] MD [facility] hospitalist  10/23/2020, 9:47 PM

## 2020-10-23 NOTE — ED Provider Notes (Signed)
[facility] EMERGENCY DEPARTMENT Provider Note   CSN: [id number] Arrival date & time: 10/23/20  1310     History Chief Complaint  Patient presents with  . Hypoxia    [name] [name] is a 80 y.o. female with past medical history significant for CKD, congenital solitary kidney, HTN, HLD, and recent hospitalization 09/01/2020 for altered mental status in context of sepsis from pneumonia who presents to the ED via EMS with complaints of shortness of breath and weakness.  I obtained history from EMS reports that a home health aide noted that patient was hypoxic at home in the 80s and auscultated rales.  She was oxygenating well on room air per EMS.  Mildly tachypneic and tachycardic, but heart rate improved with 500 cc bolus.  On my examination, patient is accompanied by her daughter who was at bedside.  Evidently she was discharged home from the rehabilitation center approximately 1 month ago.  She lives alone, but her daughter lives a few houses down the road.  She states that for the past 3 weeks she has been having orthopnea and worsening cough while laying flat.  She states that her cough has been getting progressively worse over the course of the past week and is concerned for recurrent pneumonia.  She also endorses a central chest discomfort, but suspects it to be mucus that she cannot expel from chest.  She states that sometimes she coughs up very dark, blackish sputum.    She also feels as though she has had mildly progressive swelling in her legs.  She is inquiring about her rash on bilateral legs which appears consistent with venous stasis dermatitis.  She does not wear compression stockings because she states that she cannot find any that fit.  Patient argues that while she initially had improvement for the first 6 weeks after discharge from the hospital, she has recently begun to decline and is feeling weak.  She has been tachycardic throughout my initial examination into  the 100s, but no significant increased work of breathing and she is oxygenating in the 90s on room air.     HPI     Past Medical History:  Diagnosis Date  . Anxiety   . Arthritis    RA  . Carpal tunnel syndrome, bilateral   . Chronic kidney disease    CKD stage III, absence of left kidney  . CKD (chronic kidney disease) stage 3, GFR 30-59 ml/min (HCC) 09/06/2019  . Congenital absence of one kidney    Pt has right kidney only  . Depression   . Dyslipidemia 09/06/2019  . Essential hypertension 09/06/2019  . GERD (gastroesophageal reflux disease)   . Headache   . Heart murmur   . Hypercholesteremia   . Hypertension   . Peripheral vascular disease ([place])    a. s/p R external iliac stent '06 with angioplasty '13 (Dr. Benjamine Sprague) b. 01/2016: s/p PTA/stenting in L common iliac artery (Dr. Gwenlyn Found)  . Polymyalgia rheumatica ([place])   . Psoriasis   . Tremor 09/06/2019    Patient Active Problem List   Diagnosis Date Noted  . Thrombocytopenia ([place]) 09/02/2020  . Sepsis with acute hypoxic respiratory failure ([place]) 09/01/2020  . Ambulatory dysfunction 09/07/2019  . Acute respiratory failure with hypoxia ([place]) 09/06/2019  . Essential hypertension 09/06/2019  . Dyslipidemia 09/06/2019  . CKD (chronic kidney disease) stage 3, GFR 30-59 ml/min (HCC) 09/06/2019  . Tremor 09/06/2019  . Acute kidney injury superimposed on CKD ([place]) 09/06/2019  . Fall  at home, initial encounter 09/06/2019  . Homicidal ideation   . Psychosocial stressors 04/03/2018  . Primary open angle glaucoma (POAG) of both eyes, severe stage 12/25/2017  . Polypharmacy 11/11/2017  . Pneumonia of right lower lobe due to infectious organism 11/11/2017  . Congenital absence of one kidney 09/05/2017  . Macrocytic anemia 12/19/2016  . Anemia in chronic kidney disease 09/24/2016  . Stage 3 chronic kidney disease ([place]) 09/24/2016  . Peripheral vascular disease ([place])   . Acrochordon 08/03/2015  . Stasis dermatitis of both legs  01/18/2015  . Radiculopathy of lumbar region 01/06/2015  . Seborrheic keratosis 11/24/2014  . OSA (obstructive sleep apnea) 09/21/2014  . Osteoporosis 06/29/2014  . Thyroid nodule 06/29/2014  . Breast calcifications 06/29/2014  . Glaucoma 01/08/2014  . Basal cell carcinoma of scalp 11/12/2013  . Presbyopia 10/22/2013  . Resting tremor 10/14/2013  . Polymyalgia rheumatica ([place]) 09/11/2013  . GERD (gastroesophageal reflux disease) 09/11/2013  . Major depressive disorder, recurrent episode, severe ([place]) 05/14/2013  . Pulmonary nodules 05/24/2012  . Carotid bruit 04/30/2012  . Essential hypertension 04/27/2012  . Anxiety state 04/27/2012  . Mixed hyperlipidemia 04/27/2012  . Cervical spondylosis with radiculopathy 04/19/2012    Past Surgical History:  Procedure Laterality Date  . ABDOMINAL HYSTERECTOMY  1963   Partial,  Due to bleeding after delivery  . ANTERIOR CERVICAL DECOMP/DISCECTOMY FUSION  04/19/2012   Procedure: ANTERIOR CERVICAL DECOMPRESSION/DISCECTOMY FUSION 2 LEVELS;  Surgeon: Winfield Cunas, MD;  Location: [facility];  Service: Neurosurgery;  Laterality: N/A;  Cervical four-five,Cervical five-six anterior cervical decompression with fusion plating and bonegraft possible posterior cervical decompression  . APPENDECTOMY    . CARPAL TUNNEL RELEASE  1990  . CERVICAL FUSION  04/19/2012  . EYE SURGERY    . ILIAC ARTERY STENT    . PERIPHERAL VASCULAR CATHETERIZATION N/A 01/31/2016   Procedure: Abdominal Aortogram;  Surgeon: [name], MD;  Location: [facility];  Service: Cardiovascular;  Laterality: N/A;  . PERIPHERAL VASCULAR CATHETERIZATION Bilateral 01/31/2016   Procedure: Lower Extremity Angiography;  Surgeon: [name], MD;  Location: [facility];  Service: Cardiovascular;  Laterality: Bilateral;  . PERIPHERAL VASCULAR CATHETERIZATION Left 01/31/2016   Procedure: Peripheral Vascular Intervention;  Surgeon: [name], MD;  Location: [facility];  Service: Cardiovascular;  Laterality: Left;  ILIAC  . TONSILLECTOMY       OB History    Gravida  3   Para  3   Term  0   Preterm  0   AB  0   Living        SAB  0   IAB  0   Ectopic  0   Multiple      Live Births              Family History  Problem Relation Age of Onset  . Angina Mother   . Heart attack Father   . Melanoma Brother   . Cancer Sister        Lymphoma    Social History   Tobacco Use  . Smoking status: Former Smoker    Packs/day: 1.00    Years: 40.00    Pack years: 40.00    Types: Cigarettes    Quit date: 07/24/2010    Years since quitting: 10.2  . Smokeless tobacco: Never Used  . Tobacco comment: STARTED BACK JULY 2013 AND RECENTLY QUIT 03-10-2012  Vaping Use  . Vaping Use: Never used  Substance Use Topics  . Alcohol use: Never    Comment: QUIT IN 1999  . Drug use: Never    Home Medications Prior to Admission medications   Medication Sig Start Date End Date Taking? Authorizing Provider  acetaminophen (TYLENOL) 500 MG tablet Take 500 mg by mouth every 8 (eight) hours as needed for moderate pain.   Yes [provider]  albuterol (VENTOLIN HFA) 108 (90 Base) MCG/ACT inhaler Inhale 2 puffs into the lungs every 6 (six) hours as needed for cough, wheezing or shortness of breath. 07/22/19  Yes [provider]  amLODipine (NORVASC) 5 MG tablet TAKE 1 TABLET(5 MG) BY MOUTH DAILY Patient taking differently: Take 5 mg by mouth daily. 10/15/19  Yes Nicolette Bang, DO  Ascorbic Acid (VITAMIN C PO) Take 1 tablet by mouth daily.   Yes [provider]  busPIRone (BUSPAR) 15 MG tablet Take 1 tablet (15 mg total) by mouth 3 (three) times daily. 11/27/19  Yes Nicolette Bang, DO  cholecalciferol (VITAMIN D) 1000 units tablet Take 1,000 Units by mouth daily.   Yes [provider]  clopidogrel (PLAVIX) 75 MG tablet Take 1 tablet (75 mg total) by mouth every morning. 02/18/18  Yes  [name], MD  COSOPT PF 2-0.5 % SOLN ophthalmic solution Place 1 drop into both eyes 2 (two) times daily. 07/22/19  Yes [provider]  Cyanocobalamin (VITAMIN B 12 PO) Take 1 tablet by mouth daily. 12/25/12  Yes [provider]  divalproex (DEPAKOTE) 125 MG DR tablet Take 125 mg by mouth 2 (two) times daily. 08/12/19  Yes [provider]  Dorzolamide HCl-Timolol Mal PF 2-0.5 % SOLN Place 1 drop into both eyes 2 (two) times daily. 04/30/19  Yes [provider]  ferrous sulfate 325 (65 FE) MG tablet Take 325 mg by mouth daily. 04/02/19  Yes Haydee Salter, NP  furosemide (LASIX) 20 MG tablet Take 20 mg by mouth every other day as needed. 09/28/20  Yes [provider]  gabapentin (NEURONTIN) 100 MG capsule Take 100 mg by mouth 3 (three) times daily.   Yes [provider]  Latanoprostene Bunod 0.024 % SOLN Place 1 drop into both eyes at bedtime. 04/30/19  Yes [provider]  mirtazapine (REMERON) 15 MG tablet Take 1 tablet (15 mg total) by mouth at bedtime. 09/11/18  Yes Hensel, Jamal Collin, MD  nystatin (MYCOSTATIN/NYSTOP) powder Apply 1 application topically 3 (three) times daily.   Yes [provider]  pantoprazole (PROTONIX) 40 MG tablet Take 40 mg by mouth daily. 08/21/19  Yes [provider]  traMADol (ULTRAM) 50 MG tablet Take 50 mg by mouth 3 (three) times daily as needed for moderate pain.   Yes [provider]  VYZULTA 0.024 % SOLN Apply 1 drop to eye at bedtime. 07/30/19  Yes [provider]  aspirin EC 81 MG EC tablet Take 1 tablet (81 mg total) by mouth daily. Patient not taking: No sig reported 02/01/16   Eileen Stanford, PA-C    Allergies    Statins, Gabapentin, and Statins  Review of Systems   Review of Systems  All other systems reviewed and are negative.   Physical Exam Updated Vital Signs BP 131/72   Pulse 94   Temp 98.1 F (36.7 C) (Oral)   Resp 17   SpO2 94%   Physical  Exam Vitals and nursing note reviewed. Exam conducted with a chaperone present.  Constitutional:      General: She is  not in acute distress.    Appearance: She is not toxic-appearing.  HENT:     Head: Normocephalic and atraumatic.  Eyes:     General: No scleral icterus.    Conjunctiva/sclera: Conjunctivae normal.  Cardiovascular:     Rate and Rhythm: Regular rhythm. Tachycardia present.     Pulses: Normal pulses.  Pulmonary:     Effort: Pulmonary effort is normal. No respiratory distress.     Comments: Wet breath sounds bilaterally. Abdominal:     General: Abdomen is flat. There is no distension.     Palpations: Abdomen is soft.     Tenderness: There is no abdominal tenderness.     Comments: Soft, nondistended.  No areas of significant tenderness.  No overlying skin changes.  No masses appreciated.    Skin:    General: Skin is dry.  Neurological:     Mental Status: She is alert and oriented to person, place, and time.     GCS: GCS eye subscore is 4. GCS verbal subscore is 5. GCS motor subscore is 6.  Psychiatric:        Mood and Affect: Mood normal.        Behavior: Behavior normal.        Thought Content: Thought content normal.     ED Results / Procedures / Treatments   Labs (all labs ordered are listed, but only abnormal results are displayed) Labs Reviewed  CBC WITH DIFFERENTIAL/PLATELET - Abnormal; Notable for the following components:      Result Value   RBC 3.75 (*)    MCV 100.8 (*)    MCH 34.4 (*)    All other components within normal limits  CULTURE, BLOOD (ROUTINE X 2)  CULTURE, BLOOD (ROUTINE X 2)  BRAIN NATRIURETIC PEPTIDE  BASIC METABOLIC PANEL  D-DIMER, QUANTITATIVE  LACTIC ACID, PLASMA  LACTIC ACID, PLASMA  TROPONIN I (HIGH SENSITIVITY)    EKG None  Radiology No results found.  Procedures Procedures   Medications Ordered in ED Medications - No data to display  ED Course  I have reviewed the triage vital signs and the nursing  notes.  Pertinent labs & imaging results that were available during my care of the patient were reviewed by me and considered in my medical decision making (see chart for details).    MDM Rules/Calculators/A&P                          [name] [name] was evaluated in Emergency Department on 10/23/2020 for the symptoms described in the history of present illness. She was evaluated in the context of the global COVID-19 pandemic, which necessitated consideration that the patient might be at risk for infection with the SARS-CoV-2 virus that causes COVID-19. Institutional protocols and algorithms that pertain to the evaluation of patients at risk for COVID-19 are in a state of rapid change based on information released by regulatory bodies including the CDC and federal and state organizations. These policies and algorithms were followed during the patient's care in the ED.  I personally reviewed patient's medical chart and all notes from triage and staff during today's encounter. I have also ordered and reviewed all labs and imaging that I felt to be medically necessary in the evaluation of this patient's complaints and with consideration of their physical exam. If needed, translation services were available and utilized.   Patient with generalized malaise, dyspnea on exertion, cough, and reports orthopnea.  We will obtain BNP,  chest x-ray, and basic laboratory work-up.  We will add on a troponin given her reports of chest discomfort as well as a D-dimer given reports of coughing up dark black sputum concerning for hemoptysis.  Labs and imaging are pending at shift change.  At shift change care was transferred to Wyn Quaker, PA-C who will follow pending studies, re-evaluate, and determine disposition.     Final Clinical Impression(s) / ED Diagnoses Final diagnoses:  Shortness of breath    Rx / DC Orders ED Discharge Orders    None       Corena Herter, PA-C 10/23/20 1511    Tegeler,  Gwenyth Allegra, MD 10/23/20 1606

## 2020-10-23 NOTE — ED Provider Notes (Signed)
I assumed care of patient at shift change from previous team, please see their note for full H&P. Briefly patient is here for evaluation of shortness of breath worsening over the past 3 weeks with orthopnea.  She has reportedly been coughing up a dark/blackish sputum.   Physical Exam  BP 131/72   Pulse 94   Temp 98.1 F (36.7 C) (Oral)   Resp 17   SpO2 94%   Patient is awake and alert, lying in bed.  She is borderline hypoxic at rest between 90 to 91% on room air.  She does not have significant dyspnea.  MDM  Plan is to follow-up on labs.  She was reportedly hypoxic into the 80s at home when she was ambulating, will follow up on ambulatory sats here.  Labs reviewed, patient appears to have a AKI. Her D-dimer did return elevated.  I discussed this with admitting Dr. For possibility of hydration, followed by CTA PE study versus VQ scan.    The patient appears reasonably stabilized for admission considering the current resources, flow, and capabilities available in the ED at this time, and I doubt any other [facility] requiring further screening and/or treatment in the ED prior to admission assuming timely admission and bed placement.  Note: Portions of this report may have been transcribed using voice recognition software. Every effort was made to ensure accuracy; however, inadvertent computerized transcription errors may be present.       Lorin Glass, PA-C 10/23/20 2332    Pattricia Boss, MD 10/24/20 1452

## 2020-10-23 NOTE — ED Notes (Signed)
PT ambulated in room bedside on room air, dropped to 93% but quickly bounced up to 96%.

## 2020-10-24 ENCOUNTER — Inpatient Hospital Stay (HOSPITAL_COMMUNITY): Payer: PPO

## 2020-10-24 DIAGNOSIS — N179 Acute kidney failure, unspecified: Secondary | ICD-10-CM | POA: Diagnosis not present

## 2020-10-24 DIAGNOSIS — R0602 Shortness of breath: Secondary | ICD-10-CM | POA: Diagnosis not present

## 2020-10-24 DIAGNOSIS — N189 Chronic kidney disease, unspecified: Secondary | ICD-10-CM | POA: Diagnosis not present

## 2020-10-24 DIAGNOSIS — R609 Edema, unspecified: Secondary | ICD-10-CM | POA: Diagnosis not present

## 2020-10-24 LAB — CBC
HCT: 35.3 % — ABNORMAL LOW (ref 36.0–46.0)
Hemoglobin: 12.1 g/dL (ref 12.0–15.0)
MCH: 34.6 pg — ABNORMAL HIGH (ref 26.0–34.0)
MCHC: 34.3 g/dL (ref 30.0–36.0)
MCV: 100.9 fL — ABNORMAL HIGH (ref 80.0–100.0)
Platelets: 169 10*3/uL (ref 150–400)
RBC: 3.5 MIL/uL — ABNORMAL LOW (ref 3.87–5.11)
RDW: 13.8 % (ref 11.5–15.5)
WBC: 6.1 10*3/uL (ref 4.0–10.5)
nRBC: 0 % (ref 0.0–0.2)

## 2020-10-24 LAB — URINALYSIS, ROUTINE W REFLEX MICROSCOPIC
Bilirubin Urine: NEGATIVE
Glucose, UA: NEGATIVE mg/dL
Hgb urine dipstick: NEGATIVE
Ketones, ur: 5 mg/dL — AB
Leukocytes,Ua: NEGATIVE
Nitrite: NEGATIVE
Protein, ur: NEGATIVE mg/dL
Specific Gravity, Urine: 1.008 (ref 1.005–1.030)
pH: 6 (ref 5.0–8.0)

## 2020-10-24 LAB — COMPREHENSIVE METABOLIC PANEL
ALT: 7 U/L (ref 0–44)
AST: 13 U/L — ABNORMAL LOW (ref 15–41)
Albumin: 3.4 g/dL — ABNORMAL LOW (ref 3.5–5.0)
Alkaline Phosphatase: 63 U/L (ref 38–126)
Anion gap: 9 (ref 5–15)
BUN: 11 mg/dL (ref 8–23)
CO2: 30 mmol/L (ref 22–32)
Calcium: 9 mg/dL (ref 8.9–10.3)
Chloride: 99 mmol/L (ref 98–111)
Creatinine, Ser: 1.21 mg/dL — ABNORMAL HIGH (ref 0.44–1.00)
GFR, Estimated: 45 mL/min — ABNORMAL LOW (ref >60)
Glucose, Bld: 79 mg/dL (ref 70–99)
Potassium: 2.9 mmol/L — ABNORMAL LOW (ref 3.5–5.1)
Sodium: 138 mmol/L (ref 135–145)
Total Bilirubin: 1.5 mg/dL — ABNORMAL HIGH (ref 0.3–1.2)
Total Protein: 6.5 g/dL (ref 6.5–8.1)

## 2020-10-24 LAB — PROTIME-INR
INR: 1.1 (ref 0.8–1.2)
Prothrombin Time: 13.9 seconds (ref 11.4–15.2)

## 2020-10-24 LAB — MAGNESIUM: Magnesium: 1.5 mg/dL — ABNORMAL LOW (ref 1.7–2.4)

## 2020-10-24 LAB — PHOSPHORUS: Phosphorus: 3.3 mg/dL (ref 2.5–4.6)

## 2020-10-24 LAB — APTT: aPTT: 46 seconds — ABNORMAL HIGH (ref 24–36)

## 2020-10-24 MED ORDER — GABAPENTIN 100 MG PO CAPS
100.0000 mg | ORAL_CAPSULE | Freq: Three times a day (TID) | ORAL | Status: DC
  Administered 2020-10-24 – 2020-11-05 (×37): 100 mg via ORAL
  Filled 2020-10-24 (×37): qty 1

## 2020-10-24 MED ORDER — CHLORHEXIDINE GLUCONATE CLOTH 2 % EX PADS
6.0000 | MEDICATED_PAD | Freq: Every day | CUTANEOUS | Status: DC
  Administered 2020-10-24 – 2020-11-05 (×13): 6 via TOPICAL

## 2020-10-24 MED ORDER — MAGNESIUM SULFATE 2 GM/50ML IV SOLN
2.0000 g | Freq: Once | INTRAVENOUS | Status: AC
  Administered 2020-10-24: 2 g via INTRAVENOUS
  Filled 2020-10-24: qty 50

## 2020-10-24 MED ORDER — DIVALPROEX SODIUM 125 MG PO DR TAB
125.0000 mg | DELAYED_RELEASE_TABLET | Freq: Two times a day (BID) | ORAL | Status: DC
  Administered 2020-10-24 – 2020-11-05 (×25): 125 mg via ORAL
  Filled 2020-10-24 (×27): qty 1

## 2020-10-24 MED ORDER — ENOXAPARIN SODIUM 80 MG/0.8ML ~~LOC~~ SOLN
1.0000 mg/kg | Freq: Two times a day (BID) | SUBCUTANEOUS | Status: DC
  Administered 2020-10-24 – 2020-10-26 (×3): 70 mg via SUBCUTANEOUS
  Filled 2020-10-24 (×4): qty 0.8

## 2020-10-24 MED ORDER — TRAMADOL HCL 50 MG PO TABS
50.0000 mg | ORAL_TABLET | Freq: Three times a day (TID) | ORAL | Status: DC | PRN
  Administered 2020-10-24 – 2020-11-05 (×18): 50 mg via ORAL
  Filled 2020-10-24 (×19): qty 1

## 2020-10-24 MED ORDER — CLOPIDOGREL BISULFATE 75 MG PO TABS
75.0000 mg | ORAL_TABLET | Freq: Every morning | ORAL | Status: DC
  Administered 2020-10-24 – 2020-11-05 (×13): 75 mg via ORAL
  Filled 2020-10-24 (×12): qty 1

## 2020-10-24 MED ORDER — ACETAMINOPHEN 500 MG PO TABS
500.0000 mg | ORAL_TABLET | Freq: Three times a day (TID) | ORAL | Status: DC | PRN
  Administered 2020-10-24 – 2020-11-05 (×11): 500 mg via ORAL
  Filled 2020-10-24 (×11): qty 1

## 2020-10-24 MED ORDER — POTASSIUM CHLORIDE CRYS ER 20 MEQ PO TBCR
40.0000 meq | EXTENDED_RELEASE_TABLET | ORAL | Status: AC
  Administered 2020-10-24 (×2): 40 meq via ORAL
  Filled 2020-10-24 (×2): qty 2

## 2020-10-24 MED ORDER — VITAMIN D 25 MCG (1000 UNIT) PO TABS
1000.0000 [IU] | ORAL_TABLET | Freq: Every day | ORAL | Status: DC
  Administered 2020-10-24 – 2020-11-05 (×13): 1000 [IU] via ORAL
  Filled 2020-10-24 (×13): qty 1

## 2020-10-24 MED ORDER — FERROUS SULFATE 325 (65 FE) MG PO TABS
325.0000 mg | ORAL_TABLET | Freq: Every day | ORAL | Status: DC
  Administered 2020-10-24 – 2020-11-05 (×13): 325 mg via ORAL
  Filled 2020-10-24 (×13): qty 1

## 2020-10-24 NOTE — Progress Notes (Signed)
Pt c/o difficulty urinating despite having the urge to go. Lower abdomen found to be tight and distended. Bladder scan volume >400/mL.  Notified Pahwani, MD and received order for in and out cath d/t urinary retention.  I&O cath performed w/ 765mL out.   Will continue to monitor for further urinary retention.

## 2020-10-24 NOTE — H&P (Signed)
PROGRESS NOTE    [name] [name]  EXH:[id number] DOB: 1941-02-01 DOA: 10/23/2020 PCP: [name], MD   Brief Narrative:  HPI: [name] [name] is a 80 y.o. female with medical history significant for CKD 3B, congenital solitary kidney hypertension, hyperlipidemia and peripheral vascular disease who presents to the emergency department due to generalized weakness and shortness of breath.  Patient states that she was admitted to this hospital in February due to pneumonia and was then discharged to a skilled nursing facility, she states that she was discharged from the nursing facility about 1 month ago.  Patient complained of cough and shortness of breath when she lays flat and that she occasionally coughs up a blackish sputum.  Patient states that she has not been eating and drinking enough over the past few weeks and that her blood pressure usually  increase while shortness of breath worsens when she stands.  Patient complained of rashes on both legs which appears to be dried blister wounds and bilateral erythematous rash of similar pattern (chronic venous stasis dermatitis.  Patient also complained of mild epigastric pain which worsens when she eats.  She lives alone, but daughter lives about 3 houses from her and was always around to assist patient. Recent hospitalization: 09/01/2020 to 09/10/2020 Sepsis with acute hypoxic respiratory failure due to pneumonia and altered mental status.  She was treated with 5 days of antibiotics with MRSA coverage and was discharged to a SNF  ED Course:  In the emergency department, she was hemodynamically stable.  Work-up done in the ED showed elevated MCV (100.8), BUN/creatinine 16/1.52 (baseline creatinine at 0.9-1.3), D-dimer 1.04, troponin x2 was flat at 7, BNP 18.8, lactic acid 0.8 Chest x-ray showed resolution of previously seen right basilar pneumonia Hospitalist was asked to admit patient for further evaluation and management.   Assessment & Plan:   Active  Problems:   Anxiety   Mixed hyperlipidemia   GERD (gastroesophageal reflux disease)   Depression   Dehydration   Stasis dermatitis of both legs   Generalized weakness   AKI (acute kidney injury) ([place])   Essential hypertension   Acute kidney injury superimposed on chronic kidney disease (HCC)   Shortness of breath   Elevated d-dimer   Failure to thrive in adult   Obesity (BMI 30.0-34.9)   Acute hypoxic respiratory failure: She states that her breathing is better than yesterday.  Still on 1.5 L of oxygen.  We will try to wean that off.  Elevated D-dimer.  She could not complete/lay flat for 20-minute for VQ scan to be completed.  I personally doubt DVT.  Will pursue Doppler lower extremity.  Continue anticoagulation in the meantime.  Check transthoracic echo.  She has wheezes on the exam but denies any history of COPD, asthma or any active smoking however she is extensively exposed to passive smoking as her daughter smokes who does not live with her but sees her every day for couple of hours.  Generalized weakness in the setting of failure to thrive in adult: Patient has had poor and oral intake since returning from SNF.  PT OT consulted.  I encouraged her for p.o. intake.  Dysuria/acute urinary retention: Patient was complaining of retention.  She was having some dribbling and that caused dysuria.  UA negative for acute infection.  She was straight cathed.  Monitor for now.  If develops retention again, might need indwelling Foley catheter.  Hypomagnesemia: 1.5 today.  Will replace.  Hypokalemia: 2.9.  Will replace.  Recheck  in the morning.  Acute kidney injury superimposed on CKD stage IIIa/dehydration: Baseline creatinine around 1.06 with GFR of 53.  Presented with creatinine of 1.52 with GFR of 34.  Likely due to dehydration.  Improved but still slightly off of her baseline..  Stasis dermatitis of both legs with multiple scabs.  Patient states that she developed all those during her  recent hospitalization.  There is stable so continue wound care.  Essential hypertension: Controlled Continue amlodipine Lasix temporarily held due to AKI on CKD  Macrocytic anemia Continue vitamin B12  GERD Continue Protonix  Depression and anxiety Continue buspirone and Remeron  Obesity(BMI 30.61):  counseled on diet and nursing medication  DVT prophylaxis: SCDs Start: 10/23/20 1917   Code Status: DNR  Family Communication: None present at bedside.  Plan of care discussed with patient in length and he verbalized understanding and agreed with it.  Status is: Inpatient  Remains inpatient appropriate because:Ongoing diagnostic testing needed not appropriate for outpatient work up   Dispo: The patient is from: Home              Anticipated d/c is to: Home              Patient currently is not medically stable to d/c.   Difficult to place patient No        Estimated body mass index is 30.61 kg/m as calculated from the following:   Height as of 09/01/20: 5' (1.524 m).   Weight as of this encounter: 71.1 kg.  Pressure Injury 09/05/20 Buttocks Right;Left Deep Tissue Pressure Injury - Purple or maroon localized area of discolored intact skin or blood-filled blister due to damage of underlying soft tissue from pressure and/or shear. Its purple and it blanches (Active)  09/05/20 0900  Location: Buttocks  Location Orientation: Right;Left  Staging: Deep Tissue Pressure Injury - Purple or maroon localized area of discolored intact skin or blood-filled blister due to damage of underlying soft tissue from pressure and/or shear.  Wound Description (Comments): Its purple and it blanches  Present on Admission: Yes     Nutritional status:               Consultants:   None  Procedures:   None  Antimicrobials:  Anti-infectives (From admission, onward)   None         Subjective: Seen and examined.  She states that her breathing is better than yesterday.   No other complaint.  Objective: Vitals:   10/23/20 2040 10/24/20 0434 10/24/20 0915 10/24/20 0916  BP: 138/73 127/66 138/74 138/74  Pulse: 90 97  99  Resp: 16 18  17   Temp: 98.1 F (36.7 C) 98 F (36.7 C)  98 F (36.7 C)  TempSrc:      SpO2: 100% 97%  98%  Weight: 71.1 kg       Intake/Output Summary (Last 24 hours) at 10/24/2020 1134 Last data filed at 10/24/2020 0840 Gross per 24 hour  Intake --  Output 735 ml  Net -735 ml   Filed Weights   10/23/20 2040  Weight: 71.1 kg    Examination:  General exam: Appears calm and comfortable, obese Respiratory system: Bibasilar crackles with some expiratory wheezes. Respiratory effort normal. Cardiovascular system: S1 & S2 heard, RRR. No JVD, murmurs, rubs, gallops or clicks.  +1 pitting edema bilateral lower extremity. Gastrointestinal system: Abdomen is nondistended, soft and nontender. No organomegaly or masses felt. Normal bowel sounds heard. Central nervous system: Alert and oriented. No focal neurological  deficits. Extremities: Symmetric 5 x 5 power. Skin: No rashes, lesions or ulcers Psychiatry: Judgement and insight appear normal. Mood & affect appropriate.    Data Reviewed: I have personally reviewed following labs and imaging studies  CBC: Recent Labs  Lab 10/23/20 1426 10/24/20 0502  WBC 9.3 6.1  NEUTROABS 6.3  --   HGB 12.9 12.1  HCT 37.8 35.3*  MCV 100.8* 100.9*  PLT 198 166   Basic Metabolic Panel: Recent Labs  Lab 10/23/20 1426 10/24/20 0502  NA 138 138  K 3.6 2.9*  CL 97* 99  CO2 31 30  GLUCOSE 87 79  BUN 16 11  CREATININE 1.52* 1.21*  CALCIUM 9.1 9.0  MG  --  1.5*  PHOS  --  3.3   GFR: Estimated Creatinine Clearance: 32.1 mL/min (A) (by C-G formula based on SCr of 1.21 mg/dL (H)). Liver Function Tests: Recent Labs  Lab 10/24/20 0502  AST 13*  ALT 7  ALKPHOS 63  BILITOT 1.5*  PROT 6.5  ALBUMIN 3.4*   No results for input(s): LIPASE, AMYLASE in the last 168 hours. No results for  input(s): AMMONIA in the last 168 hours. Coagulation Profile: Recent Labs  Lab 10/24/20 0502  INR 1.1   Cardiac Enzymes: No results for input(s): CKTOTAL, CKMB, CKMBINDEX, TROPONINI in the last 168 hours. BNP (last 3 results) No results for input(s): PROBNP in the last 8760 hours. HbA1C: No results for input(s): HGBA1C in the last 72 hours. CBG: No results for input(s): GLUCAP in the last 168 hours. Lipid Profile: No results for input(s): CHOL, HDL, LDLCALC, TRIG, CHOLHDL, LDLDIRECT in the last 72 hours. Thyroid Function Tests: No results for input(s): TSH, T4TOTAL, FREET4, T3FREE, THYROIDAB in the last 72 hours. Anemia Panel: No results for input(s): VITAMINB12, FOLATE, FERRITIN, TIBC, IRON, RETICCTPCT in the last 72 hours. Sepsis Labs: Recent Labs  Lab 10/23/20 1426 10/23/20 1607  LATICACIDVEN 1.1 0.8    No results found for this or any previous visit (from the past 240 hour(s)).    Radiology Studies: DG Chest Portable 1 View  Result Date: 10/23/2020 CLINICAL DATA:  Cough and shortness of breath EXAM: PORTABLE CHEST 1 VIEW COMPARISON:  09/01/2020 FINDINGS: Cardiac shadow is stable. Previously seen right basilar infiltrate has resolved in the interval. No new focal infiltrate is seen. No bony abnormality is noted. Stable postsurgical changes in the cervical spine are noted. IMPRESSION: Resolution of previously seen right basilar pneumonia. Electronically Signed   By: Inez Catalina M.D.   On: 10/23/2020 15:26    Scheduled Meds: . amLODipine  5 mg Oral Daily  . aspirin  81 mg Oral Daily  . busPIRone  15 mg Oral TID  . cholecalciferol  1,000 Units Oral Daily  . clopidogrel  75 mg Oral q morning  . divalproex  125 mg Oral BID  . ferrous sulfate  325 mg Oral Daily  . gabapentin  100 mg Oral TID  . mirtazapine  15 mg Oral QHS  . pantoprazole  40 mg Oral Daily  . potassium chloride  40 mEq Oral Q4H  . vitamin B-12  100 mcg Oral Daily   Continuous Infusions:   LOS: 1 day    Time spent: 35 minutes   Darliss Cheney, MD [facility]  10/24/2020, 11:34 AM   To contact the attending provider between 7A-7P or the covering provider during after hours 7P-7A, please log into the web site [email or web address].

## 2020-10-24 NOTE — Progress Notes (Signed)
Pt began to c/o bladder pain and difficultly passing urine again. Bladder scan showed 456mL.   Per Dr. Doristine Bosworth, if I&O cath needed again, place a foley catheter.  Foley placed at bedside with tech present. 415mL clear, yellow urine returned.

## 2020-10-24 NOTE — Evaluation (Signed)
Physical Therapy Evaluation Patient Details Name: [name] [name] MRN: [id number] DOB: 1941-05-14 Today's Date: 10/24/2020   History of Present Illness  [name] [name] is a 80 y/o female who was sent to ED on 10/23/20 after her home health nurse noticed hypoxia and an irregular HR. Pt admitted due to SOB with further work up pending. Pt with recent admission on 09/01/20 for sepsis secondary to PNA. PMH includes CKD, congenital solitary kideny, HTN, and HLD.    Clinical Impression  Pt received in bed, reporting increase pain in L UE but willing to participate in PT. Needed mod - max Ax1 for all mobility due to increased pain. Upon sitting at EOB, pt became tremulous and felt "woozy." Pt tachycardic with HR in high 120s, BP 144/117. Pt returned to supine. BP returned to 124/55, HR low 100s. Pt reports that she does have a history of vertigo and is dizzy with any movement (rolling in bed, sitting up, etc). May benefit from further vestibular screen. Pt would benefit from PT to improve balance, decrease risk for falls, and increase independency. Will continue to follow acutely.    Follow Up Recommendations SNF;Supervision for mobility/OOB;Other (comment) (May be able to progress to home health PT)    Equipment Recommendations  None recommended by PT    Recommendations for Other Services       Precautions / Restrictions Precautions Precautions: Fall Precaution Comments: monitor O2 and HR, highly painful and sensitive L UE Restrictions Weight Bearing Restrictions: No      Mobility  Bed Mobility Overal bed mobility: Needs Assistance Bed Mobility: Supine to Sit;Sit to Supine;Rolling Rolling: Max assist   Supine to sit: HOB elevated;Mod assist Sit to supine: Max assist   General bed mobility comments: Mod A for LE management and to bring trunk upright, pt unable to reach with L LE to use handrail due to pain, max A for rolling due to pain    Transfers                 General transfer comment:  deferred due to tachycardia and pt not feeling well upon sitting  Ambulation/Gait             General Gait Details: deferred  Stairs            Wheelchair Mobility    Modified Rankin (Stroke Patients Only)       Balance Overall balance assessment: Needs assistance Sitting-balance support: Feet supported;Single extremity supported Sitting balance-Leahy Scale: Fair         Standing balance comment: unable to assess                             Pertinent Vitals/Pain Pain Assessment: 0-10 Pain Score: 10-Worst pain ever Pain Location: L arm Pain Descriptors / Indicators: Discomfort;Grimacing Pain Intervention(s): Monitored during session    Home Living Family/patient expects to be discharged to:: Private residence Living Arrangements: Alone Available Help at Discharge: Family;Available PRN/intermittently Type of Home: House Home Access: Ramped entrance     Home Layout: One level Home Equipment: Walker - 2 wheels;Walker - 4 wheels;Shower seat;Wheelchair - manual;Bedside commode      Prior Function Level of Independence: Independent with assistive device(s)   Gait / Transfers Assistance Needed: Reports able to ambulate with Rollator and enjoys walking outside. Daughter lives 2 houses away, but daughter works 2 jobs. Son and grandson visit 3x/week from [place] and want pt to come live with them 24/7 but  pt and daughter hesitant. Pt reports receiving home health PT 3x/week.  ADL's / Homemaking Assistance Needed: Pt reports Modified Independence with ADLs, has assist with med mgmt from daughter foor bathing. Pt able to dress herself independently.        Hand Dominance   Dominant Hand: Right    Extremity/Trunk Assessment   Upper Extremity Assessment Upper Extremity Assessment: Defer to OT evaluation    Lower Extremity Assessment Lower Extremity Assessment: Overall WFL for tasks assessed       Communication   Communication: No  difficulties  Cognition Arousal/Alertness: Awake/alert Behavior During Therapy: WFL for tasks assessed/performed Overall Cognitive Status: No family/caregiver present to determine baseline cognitive functioning                                 General Comments: A&Ox4. Did state year was 2020 at first but then corrected herself      General Comments General comments (skin integrity, edema, etc.): Pt received on 2L. Reports not using O2 at baseline. Trialed RA with SPO2 > 96%. Pt left on RA with RN aware    Exercises     Assessment/Plan    PT Assessment Patient needs continued PT services  PT Problem List Decreased strength;Decreased mobility;Decreased safety awareness;Decreased activity tolerance;Cardiopulmonary status limiting activity;Decreased balance;Decreased cognition       PT Treatment Interventions Therapeutic activities;Gait training;Therapeutic exercise;Patient/family education;Balance training;Stair training;Functional mobility training;DME instruction;Neuromuscular re-education    PT Goals (Current goals can be found in the Care Plan section)  Acute Rehab PT Goals Patient Stated Goal: less pain in arm PT Goal Formulation: With patient Time For Goal Achievement: 11/07/20 Potential to Achieve Goals: Good    Frequency Min 3X/week   Barriers to discharge        Co-evaluation               AM-PAC PT "6 Clicks" Mobility  Outcome Measure Help needed turning from your back to your side while in a flat bed without using bedrails?: A Lot Help needed moving from lying on your back to sitting on the side of a flat bed without using bedrails?: A Lot Help needed moving to and from a bed to a chair (including a wheelchair)?: A Lot Help needed standing up from a chair using your arms (e.g., wheelchair or bedside chair)?: A Lot Help needed to walk in hospital room?: A Lot Help needed climbing 3-5 steps with a railing? : Total 6 Click Score: 11    End of  Session Equipment Utilized During Treatment: Gait belt Activity Tolerance: Patient tolerated treatment well Patient left: with call bell/phone within reach;in bed;with bed alarm set Nurse Communication: Mobility status;Other (comment) (Tachycardic, left on RA) PT Visit Diagnosis: Unsteadiness on feet (R26.81);Muscle weakness (generalized) (M62.81)    Time: (P) 1230-(P) 1259 PT Time Calculation (min) (ACUTE ONLY): (P) 29 min   Charges:   PT Evaluation $PT Eval Moderate Complexity: (P) 1 Mod PT Treatments $Therapeutic Activity: (P) 8-22 mins   Rosita Kea, SPT

## 2020-10-24 NOTE — Progress Notes (Addendum)
ANTICOAGULATION CONSULT NOTE - Initial Consult  Pharmacy Consult for enoxaparin  Indication: VTE treatment pending VQ scan   Allergies  Allergen Reactions  . Statins   . Gabapentin Itching    Was deleted since patient was taking, but now experiencing itching again.   . Statins Swelling, Rash and Other (See Comments)    Swelling involving tongue; also causes muscle pain    Patient Measurements: Weight 71.1kg Height 152.4 cm  Vital Signs: Temp: 98 F (36.7 C) (04/17 0916) BP: 118/73 (04/17 1202) Pulse Rate: 101 (04/17 1202)  Labs: Recent Labs    10/23/20 1426 10/23/20 1609 10/24/20 0502  HGB 12.9  --  12.1  HCT 37.8  --  35.3*  PLT 198  --  169  APTT  --   --  46*  LABPROT  --   --  13.9  INR  --   --  1.1  CREATININE 1.52*  --  1.21*  TROPONINIHS 7 7  --     Estimated Creatinine Clearance: 32.1 mL/min (A) (by C-G formula based on SCr of 1.21 mg/dL (H)).   Medical History: Past Medical History:  Diagnosis Date  . Anxiety   . Arthritis    RA  . Carpal tunnel syndrome, bilateral   . Chronic kidney disease    CKD stage III, absence of left kidney  . CKD (chronic kidney disease) stage 3, GFR 30-59 ml/min (HCC) 09/06/2019  . Congenital absence of one kidney    Pt has right kidney only  . Depression   . Dyslipidemia 09/06/2019  . Essential hypertension 09/06/2019  . GERD (gastroesophageal reflux disease)   . Headache   . Heart murmur   . Hypercholesteremia   . Hypertension   . Peripheral vascular disease ([place])    a. s/p R external iliac stent '06 with angioplasty '13 (Dr. Benjamine Sprague) b. 01/2016: s/p PTA/stenting in L common iliac artery (Dr. Gwenlyn Found)  . Polymyalgia rheumatica ([place])   . Psoriasis   . Tremor 09/06/2019    Assessment: 80 yo W admitted for AMS in the setting of pneumonia. No anticoagulation prior to admission. Pharmacy consulted for enoxaparin treatment dose pending VQ scan to rule out PE. Patient previously received 1x dose of enoxaparin 60mg  yesterday  (dose adjusted for renal function).  CrCl improved at 32.53ml/min with Scr 1.21 today  CBC: Hgb 12.1, plt 169   Goal of Therapy:  Monitor platelets by anticoagulation protocol: Yes   Plan:  Enoxaparin 1mg /kg BID (70mg  subcu BID)  Monitor CBC Monitor for signs/symptoms of bleeding  F/u VQ scan   Wilson Singer, PharmD PGY1 Pharmacy Resident 10/24/2020 1:51 PM

## 2020-10-24 NOTE — Progress Notes (Signed)
VASCULAR LAB    Bilateral lower extremity venous duplex has been performed.  See CV proc for preliminary results.   Shweta Aman, RVT 10/24/2020, 2:16 PM

## 2020-10-25 ENCOUNTER — Inpatient Hospital Stay (HOSPITAL_COMMUNITY): Payer: PPO

## 2020-10-25 DIAGNOSIS — I5030 Unspecified diastolic (congestive) heart failure: Secondary | ICD-10-CM

## 2020-10-25 DIAGNOSIS — N179 Acute kidney failure, unspecified: Secondary | ICD-10-CM | POA: Diagnosis not present

## 2020-10-25 DIAGNOSIS — N189 Chronic kidney disease, unspecified: Secondary | ICD-10-CM | POA: Diagnosis not present

## 2020-10-25 LAB — CBC WITH DIFFERENTIAL/PLATELET
Abs Immature Granulocytes: 0.03 10*3/uL (ref 0.00–0.07)
Basophils Absolute: 0 10*3/uL (ref 0.0–0.1)
Basophils Relative: 0 %
Eosinophils Absolute: 0.1 10*3/uL (ref 0.0–0.5)
Eosinophils Relative: 1 %
HCT: 34.9 % — ABNORMAL LOW (ref 36.0–46.0)
Hemoglobin: 11.7 g/dL — ABNORMAL LOW (ref 12.0–15.0)
Immature Granulocytes: 0 %
Lymphocytes Relative: 22 %
Lymphs Abs: 1.7 10*3/uL (ref 0.7–4.0)
MCH: 34.1 pg — ABNORMAL HIGH (ref 26.0–34.0)
MCHC: 33.5 g/dL (ref 30.0–36.0)
MCV: 101.7 fL — ABNORMAL HIGH (ref 80.0–100.0)
Monocytes Absolute: 1 10*3/uL (ref 0.1–1.0)
Monocytes Relative: 13 %
Neutro Abs: 4.9 10*3/uL (ref 1.7–7.7)
Neutrophils Relative %: 64 %
Platelets: 180 10*3/uL (ref 150–400)
RBC: 3.43 MIL/uL — ABNORMAL LOW (ref 3.87–5.11)
RDW: 13.9 % (ref 11.5–15.5)
WBC: 7.7 10*3/uL (ref 4.0–10.5)
nRBC: 0 % (ref 0.0–0.2)

## 2020-10-25 LAB — COMPREHENSIVE METABOLIC PANEL
ALT: 8 U/L (ref 0–44)
AST: 12 U/L — ABNORMAL LOW (ref 15–41)
Albumin: 2.9 g/dL — ABNORMAL LOW (ref 3.5–5.0)
Alkaline Phosphatase: 56 U/L (ref 38–126)
Anion gap: 8 (ref 5–15)
BUN: 16 mg/dL (ref 8–23)
CO2: 29 mmol/L (ref 22–32)
Calcium: 9.4 mg/dL (ref 8.9–10.3)
Chloride: 102 mmol/L (ref 98–111)
Creatinine, Ser: 1.23 mg/dL — ABNORMAL HIGH (ref 0.44–1.00)
GFR, Estimated: 44 mL/min — ABNORMAL LOW (ref >60)
Glucose, Bld: 101 mg/dL — ABNORMAL HIGH (ref 70–99)
Potassium: 4.2 mmol/L (ref 3.5–5.1)
Sodium: 139 mmol/L (ref 135–145)
Total Bilirubin: 0.9 mg/dL (ref 0.3–1.2)
Total Protein: 6.1 g/dL — ABNORMAL LOW (ref 6.5–8.1)

## 2020-10-25 LAB — ECHOCARDIOGRAM COMPLETE
Area-P 1/2: 4.1 cm2
S' Lateral: 2.3 cm
Weight: 2507.95 oz

## 2020-10-25 LAB — MAGNESIUM: Magnesium: 2 mg/dL (ref 1.7–2.4)

## 2020-10-25 MED ORDER — IOHEXOL 350 MG/ML SOLN
50.0000 mL | Freq: Once | INTRAVENOUS | Status: AC | PRN
  Administered 2020-10-25: 50 mL via INTRAVENOUS

## 2020-10-25 NOTE — H&P (Signed)
PROGRESS NOTE    [name] [name]  DPO:[id number] DOB: Mar 19, 1941 DOA: 10/23/2020 PCP: [name], MD   Brief Narrative:  [name] [name] is a 80 y.o. female with medical history significant for CKD 3B, congenital solitary kidney hypertension, hyperlipidemia and peripheral vascular disease who presented to the ED due to generalized weakness and shortness of breath.   she was admitted to this hospital in February due to pneumonia and was then discharged to a skilled nursing facility, she was discharged from the nursing facility about 1 month ago.  Patient complained of cough and shortness of breath when she lays flat and that she occasionally coughs up a blackish sputum.  Patient states that she has not been eating and drinking enough over the past few weeks and that her blood pressure usually  increase while shortness of breath worsens when she stands.  Patient complained of rashes on both legs which appears to be dried blister wounds and bilateral erythematous rash of similar pattern (chronic venous stasis dermatitis.  Patient also complained of mild epigastric pain which worsens when she eats.  She lives alone, but daughter lives about 3 houses from her and was always around to assist patient.  Recent hospitalization: 09/01/2020 to 09/10/2020 Sepsis with acute hypoxic respiratory failure due to pneumonia and altered mental status. She was treated with 5 days of antibiotics with MRSA coverage and was discharged to a SNF  In the emergency department, she was hemodynamically stable.  Work-up done in the ED showed elevated MCV (100.8), BUN/creatinine 16/1.52 (baseline creatinine at 0.9-1.3), D-dimer 1.04, troponin x2 was flat at 7, BNP 18.8, lactic acid 0.8 Chest x-ray showed resolution of previously seen right basilar pneumonia.  She was admitted to hospital service.  Due to elevated creatinine,CT angiogram of the chest was not done and VQ scan was ordered however patient could not complete that test either.   Doppler lower extremity ruled out DVT.  Assessment & Plan:   Active Problems:   Anxiety   Mixed hyperlipidemia   GERD (gastroesophageal reflux disease)   Depression   Dehydration   Stasis dermatitis of both legs   Generalized weakness   AKI (acute kidney injury) ([place])   Essential hypertension   Acute kidney injury superimposed on chronic kidney disease (HCC)   Shortness of breath   Elevated d-dimer   Failure to thrive in adult   Obesity (BMI 30.0-34.9)   Acute hypoxic respiratory failure: Elevated D-dimer.  She could not complete/lay flat for 20-minute for VQ scan to be completed.  Doppler lower extremity ruled out DVT so anticoagulation was stopped.  Echo still pending.  She complains of pleuritic chest pain today and was having problem taking deep breaths.  This once again raises suspicion for possible PE.Her GFR is 44.  Which is over cutoff to have iodinated contrast so I will proceed with CT angiogram of the chest.  Generalized weakness in the setting of failure to thrive in adult: Patient has had poor and oral intake since returning from SNF.  PT OT on board.  They recommend SNF.  TOC has been informed.  Dysuria/acute urinary retention: Patient was complaining of retention.  She was having some dribbling and some dysuria.  UA negative for acute infection.  She was straight cathed.  Had retention again and indwelling Foley catheter was placed yesterday.  Hypomagnesemia: Resolved.  Hypokalemia: Resolved.  Acute kidney injury superimposed on CKD stage IIIa/dehydration: Baseline creatinine around 1.06 with GFR of 53.  Presented with creatinine of 1.52  with GFR of 34.  Likely due to dehydration.  Improved and seems to be back to her baseline.  Stasis dermatitis of both legs with multiple scabs.  Patient states that she developed all those during her recent hospitalization.  There is stable so continue wound care.  Essential hypertension: Controlled Continue amlodipine Lasix  temporarily held due to AKI on CKD  Macrocytic anemia Continue vitamin B12  GERD Continue Protonix  Depression and anxiety Continue buspirone and Remeron  Obesity(BMI 30.61):  counseled on diet and nursing medication  DVT prophylaxis: SCDs Start: 10/23/20 1917   Code Status: DNR  Family Communication: None present at bedside.  Plan of care discussed with patient in length and he verbalized understanding and agreed with it.  Status is: Inpatient  Remains inpatient appropriate because:Ongoing diagnostic testing needed not appropriate for outpatient work up   Dispo: The patient is from: Home              Anticipated d/c is to: SNF              Patient currently is not medically stable to d/c.   Difficult to place patient No        Estimated body mass index is 30.61 kg/m as calculated from the following:   Height as of 09/01/20: 5' (1.524 m).   Weight as of this encounter: 71.1 kg.      Nutritional status:               Consultants:   None  Procedures:   None  Antimicrobials:  Anti-infectives (From admission, onward)   None         Subjective: Seen and examined.  Complains of generalized body ache and pleuritic chest pain whenever she takes deep breath.  No chest pain otherwise.  No shortness of breath.  No other complaint.  Objective: Vitals:   10/24/20 1946 10/25/20 0511 10/25/20 1028 10/25/20 1135  BP: (!) 120/94 (!) 114/55 (!) 131/99 117/78  Pulse: 95 93    Resp: 18 18    Temp: 99.6 F (37.6 C) 97.9 F (36.6 C)  (!) 100.7 F (38.2 C)  TempSrc: Oral Oral  Oral  SpO2: 97% 96%    Weight:        Intake/Output Summary (Last 24 hours) at 10/25/2020 1159 Last data filed at 10/25/2020 0402 Gross per 24 hour  Intake --  Output 500 ml  Net -500 ml   Filed Weights   10/23/20 2040  Weight: 71.1 kg    Examination:  General exam: Appears calm and comfortable  Respiratory system: Slightly dyspneic when trying to get up.  Unable  to take deep breaths.  Has rhonchi bilaterally with diminished breath sounds. Cardiovascular system: S1 & S2 heard, RRR. No JVD, murmurs, rubs, gallops or clicks. No pedal edema. Gastrointestinal system: Abdomen is nondistended, soft and nontender. No organomegaly or masses felt. Normal bowel sounds heard. Central nervous system: Alert and oriented. No focal neurological deficits. Extremities: Symmetric 5 x 5 power. Skin: No rashes, lesions or ulcers.  Psychiatry: Judgement and insight appear normal. Mood & affect appropriate.   Data Reviewed: I have personally reviewed following labs and imaging studies  CBC: Recent Labs  Lab 10/23/20 1426 10/24/20 0502 10/25/20 0353  WBC 9.3 6.1 7.7  NEUTROABS 6.3  --  4.9  HGB 12.9 12.1 11.7*  HCT 37.8 35.3* 34.9*  MCV 100.8* 100.9* 101.7*  PLT 198 169 850   Basic Metabolic Panel: Recent Labs  Lab 10/23/20 1426  10/24/20 0502 10/25/20 0353  NA 138 138 139  K 3.6 2.9* 4.2  CL 97* 99 102  CO2 31 30 29   GLUCOSE 87 79 101*  BUN 16 11 16   CREATININE 1.52* 1.21* 1.23*  CALCIUM 9.1 9.0 9.4  MG  --  1.5* 2.0  PHOS  --  3.3  --    GFR: Estimated Creatinine Clearance: 31.5 mL/min (A) (by C-G formula based on SCr of 1.23 mg/dL (H)). Liver Function Tests: Recent Labs  Lab 10/24/20 0502 10/25/20 0353  AST 13* 12*  ALT 7 8  ALKPHOS 63 56  BILITOT 1.5* 0.9  PROT 6.5 6.1*  ALBUMIN 3.4* 2.9*   No results for input(s): LIPASE, AMYLASE in the last 168 hours. No results for input(s): AMMONIA in the last 168 hours. Coagulation Profile: Recent Labs  Lab 10/24/20 0502  INR 1.1   Cardiac Enzymes: No results for input(s): CKTOTAL, CKMB, CKMBINDEX, TROPONINI in the last 168 hours. BNP (last 3 results) No results for input(s): PROBNP in the last 8760 hours. HbA1C: No results for input(s): HGBA1C in the last 72 hours. CBG: No results for input(s): GLUCAP in the last 168 hours. Lipid Profile: No results for input(s): CHOL, HDL, LDLCALC,  TRIG, CHOLHDL, LDLDIRECT in the last 72 hours. Thyroid Function Tests: No results for input(s): TSH, T4TOTAL, FREET4, T3FREE, THYROIDAB in the last 72 hours. Anemia Panel: No results for input(s): VITAMINB12, FOLATE, FERRITIN, TIBC, IRON, RETICCTPCT in the last 72 hours. Sepsis Labs: Recent Labs  Lab 10/23/20 1426 10/23/20 1607  LATICACIDVEN 1.1 0.8    Recent Results (from the past 240 hour(s))  Blood culture (routine x 2)     Status: None (Preliminary result)   Collection Time: 10/23/20  2:10 PM   Specimen: BLOOD  Result Value Ref Range Status   Specimen Description BLOOD SITE NOT SPECIFIED  Final   Special Requests   Final    BOTTLES DRAWN AEROBIC AND ANAEROBIC Blood Culture adequate volume   Culture   Final    NO GROWTH 2 DAYS Performed at [facility], [address] [place], [place] [address]    Report Status PENDING  Incomplete  Blood culture (routine x 2)     Status: None (Preliminary result)   Collection Time: 10/23/20  2:26 PM   Specimen: BLOOD  Result Value Ref Range Status   Specimen Description BLOOD SITE NOT SPECIFIED  Final   Special Requests   Final    BOTTLES DRAWN AEROBIC AND ANAEROBIC Blood Culture adequate volume   Culture   Final    NO GROWTH 2 DAYS Performed at [facility], [address], [place], [place] [address]    Report Status PENDING  Incomplete      Radiology Studies: DG Chest Portable 1 View  Result Date: 10/23/2020 CLINICAL DATA:  Cough and shortness of breath EXAM: PORTABLE CHEST 1 VIEW COMPARISON:  09/01/2020 FINDINGS: Cardiac shadow is stable. Previously seen right basilar infiltrate has resolved in the interval. No new focal infiltrate is seen. No bony abnormality is noted. Stable postsurgical changes in the cervical spine are noted. IMPRESSION: Resolution of previously seen right basilar pneumonia. Electronically Signed   By: Inez Catalina M.D.   On: 10/23/2020 15:26   VAS [place] LOWER EXTREMITY VENOUS (DVT)  Result Date:  10/24/2020  Lower Venous DVT Study Indications: SOB, and Edema.  Limitations: Body habitus and skin texture. Comparison Study: No prior study on file Performing Technologist: Sharion Dove RVS  Examination Guidelines:  A complete evaluation includes B-mode imaging, spectral Doppler, color Doppler, and power Doppler as needed of all accessible portions of each vessel. Bilateral testing is considered an integral part of a complete examination. Limited examinations for reoccurring indications may be performed as noted. The reflux portion of the exam is performed with the patient in reverse Trendelenburg.  +---------+---------------+---------+-----------+----------+-------------------+ RIGHT    CompressibilityPhasicitySpontaneityPropertiesThrombus Aging      +---------+---------------+---------+-----------+----------+-------------------+ CFV      Full           Yes      Yes                                      +---------+---------------+---------+-----------+----------+-------------------+ SFJ      Full                                                             +---------+---------------+---------+-----------+----------+-------------------+ FV Prox  Full                                                             +---------+---------------+---------+-----------+----------+-------------------+ FV Mid   Full                                                             +---------+---------------+---------+-----------+----------+-------------------+ FV DistalFull                                                             +---------+---------------+---------+-----------+----------+-------------------+ PFV      Full                                                             +---------+---------------+---------+-----------+----------+-------------------+ POP      Full           Yes      Yes                                       +---------+---------------+---------+-----------+----------+-------------------+ PTV      Full                                                             +---------+---------------+---------+-----------+----------+-------------------+ PERO  Not well visualized +---------+---------------+---------+-----------+----------+-------------------+   +---------+---------------+---------+-----------+----------+-------------------+ LEFT     CompressibilityPhasicitySpontaneityPropertiesThrombus Aging      +---------+---------------+---------+-----------+----------+-------------------+ CFV      Full           Yes      Yes                                      +---------+---------------+---------+-----------+----------+-------------------+ SFJ      Full                                                             +---------+---------------+---------+-----------+----------+-------------------+ FV Prox  Full                                                             +---------+---------------+---------+-----------+----------+-------------------+ FV Mid   Full                                                             +---------+---------------+---------+-----------+----------+-------------------+ FV DistalFull           Yes      Yes                                      +---------+---------------+---------+-----------+----------+-------------------+ PFV                                                   Not well visualized +---------+---------------+---------+-----------+----------+-------------------+ POP      Full           Yes      Yes                                      +---------+---------------+---------+-----------+----------+-------------------+ PTV      Full                                                             +---------+---------------+---------+-----------+----------+-------------------+  PERO                                                  Not well visualized +---------+---------------+---------+-----------+----------+-------------------+     Summary: RIGHT: - There is no evidence of deep vein thrombosis in the lower extremity. However, portions of  this examination were limited- see technologist comments above.  LEFT: - There is no evidence of deep vein thrombosis in the lower extremity. However, portions of this examination were limited- see technologist comments above.  - Ultrasound characteristics of a ruptured Baker's Cyst are noted.  *See table(s) above for measurements and observations.    Preliminary     Scheduled Meds: . amLODipine  5 mg Oral Daily  . aspirin  81 mg Oral Daily  . busPIRone  15 mg Oral TID  . Chlorhexidine Gluconate Cloth  6 each Topical Daily  . cholecalciferol  1,000 Units Oral Daily  . clopidogrel  75 mg Oral q morning  . divalproex  125 mg Oral BID  . enoxaparin (LOVENOX) injection  1 mg/kg Subcutaneous Q12H  . ferrous sulfate  325 mg Oral Daily  . gabapentin  100 mg Oral TID  . mirtazapine  15 mg Oral QHS  . pantoprazole  40 mg Oral Daily  . vitamin B-12  100 mcg Oral Daily   Continuous Infusions:   LOS: 2 days   Time spent: 31 minutes   Darliss Cheney, MD [facility]  10/25/2020, 11:59 AM   To contact the attending provider between 7A-7P or the covering provider during after hours 7P-7A, please log into the web site [email or web address].

## 2020-10-25 NOTE — NC FL2 (Signed)
[place] MEDICAID FL2 LEVEL OF CARE SCREENING TOOL     IDENTIFICATION  Patient Name: [name] [name] Birthdate: Apr 11, 1941 Sex: female Admission Date (Current Location): 10/23/2020  [facility] and [place] Number:  Herbalist and Address:  [facility] [place], [place] [address], [place], [place] [address]      Provider Number: [id number]  Attending Physician Name and Address:  Darliss Cheney, MD  Relative Name and Phone Number:  Hunt,Bonita Daughter [phone]    Current Level of Care: Hospital Recommended Level of Care: [place] Prior Approval Number:    Date Approved/Denied:   PASRR Number: [id number]  Discharge Plan: SNF    Current Diagnoses: Patient Active Problem List   Diagnosis Date Noted  . Shortness of breath 10/23/2020  . Elevated d-dimer 10/23/2020  . Failure to thrive in adult 10/23/2020  . Obesity (BMI 30.0-34.9) 10/23/2020  . Thrombocytopenia ([place]) 09/02/2020  . Sepsis with acute hypoxic respiratory failure ([place]) 09/01/2020  . Ambulatory dysfunction 09/07/2019  . Acute respiratory failure with hypoxia ([place]) 09/06/2019  . Essential hypertension 09/06/2019  . Dyslipidemia 09/06/2019  . CKD (chronic kidney disease) stage 3, GFR 30-59 ml/min (HCC) 09/06/2019  . Tremor 09/06/2019  . Acute kidney injury superimposed on chronic kidney disease ([place]) 09/06/2019  . Fall at home, initial encounter 09/06/2019  . Homicidal ideation   . Psychosocial stressors 04/03/2018  . AKI (acute kidney injury) ([place]) 04/03/2018  . Primary open angle glaucoma (POAG) of both eyes, severe stage 12/25/2017  . Polypharmacy 11/11/2017  . Pneumonia of right lower lobe due to infectious organism 11/11/2017  . Congenital absence of one kidney 09/05/2017  . Macrocytic anemia 12/19/2016  . Anemia in chronic kidney disease 09/24/2016  . Stage 3 chronic kidney disease ([place]) 09/24/2016  . Peripheral vascular disease ([place])   . Generalized weakness  10/18/2015  . Acrochordon 08/03/2015  . Stasis dermatitis of both legs 01/18/2015  . Radiculopathy of lumbar region 01/06/2015  . Seborrheic keratosis 11/24/2014  . Dehydration 10/19/2014  . OSA (obstructive sleep apnea) 09/21/2014  . Osteoporosis 06/29/2014  . Thyroid nodule 06/29/2014  . Breast calcifications 06/29/2014  . Glaucoma 01/08/2014  . Basal cell carcinoma of scalp 11/12/2013  . Depression 10/22/2013  . Presbyopia 10/22/2013  . Resting tremor 10/14/2013  . Polymyalgia rheumatica ([place]) 09/11/2013  . GERD (gastroesophageal reflux disease) 09/11/2013  . Major depressive disorder, recurrent episode, severe ([place]) 05/14/2013  . Pulmonary nodules 05/24/2012  . Carotid bruit 04/30/2012  . Essential hypertension 04/27/2012  . Anxiety 04/27/2012  . Mixed hyperlipidemia 04/27/2012  . Cervical spondylosis with radiculopathy 04/19/2012    Orientation RESPIRATION BLADDER Height & Weight     Self,Situation  O2 Incontinent Weight: 156 lb 12 oz (71.1 kg) Height:     BEHAVIORAL SYMPTOMS/MOOD NEUROLOGICAL BOWEL NUTRITION STATUS      Incontinent Diet (Heart diet.  See DC summary)  AMBULATORY STATUS COMMUNICATION OF NEEDS Skin   Total Care Verbally Other (Comment) (ecchymosis)                       Personal Care Assistance Level of Assistance    Bathing Assistance: Maximum assistance Feeding assistance: Limited assistance Dressing Assistance: Maximum assistance     Functional Limitations Info    Sight Info: Adequate Hearing Info: Adequate Speech Info: Adequate    SPECIAL CARE FACTORS FREQUENCY  PT (By licensed PT),OT (By licensed OT)     PT Frequency: 5x week OT Frequency: 5x week  Contractures Contractures Info: Not present    Additional Factors Info  Code Status,Allergies Code Status Info: DNR Allergies Info: Statins, Gabapentin, Statins           Current Medications (10/25/2020):  This is the current hospital active medication  list Current Facility-Administered Medications  Medication Dose Route Frequency Provider Last Rate Last Admin  . acetaminophen (TYLENOL) tablet 500 mg  500 mg Oral Q8H PRN Darliss Cheney, MD   500 mg at 10/25/20 1140  . albuterol (VENTOLIN HFA) 108 (90 Base) MCG/ACT inhaler 2 puff  2 puff Inhalation Q6H PRN Adefeso, Oladapo, DO      . amLODipine (NORVASC) tablet 5 mg  5 mg Oral Daily Adefeso, Oladapo, DO   5 mg at 10/25/20 1031  . aspirin chewable tablet 81 mg  81 mg Oral Daily Adefeso, Oladapo, DO   81 mg at 10/25/20 1031  . busPIRone (BUSPAR) tablet 15 mg  15 mg Oral TID Adefeso, Oladapo, DO   15 mg at 10/25/20 1029  . Chlorhexidine Gluconate Cloth 2 % PADS 6 each  6 each Topical Daily Darliss Cheney, MD   6 each at 10/25/20 1043  . cholecalciferol (VITAMIN D3) tablet 1,000 Units  1,000 Units Oral Daily Darliss Cheney, MD   1,000 Units at 10/25/20 1030  . clopidogrel (PLAVIX) tablet 75 mg  75 mg Oral q morning Darliss Cheney, MD   75 mg at 10/25/20 1036  . divalproex (DEPAKOTE) DR tablet 125 mg  125 mg Oral BID Darliss Cheney, MD   125 mg at 10/25/20 1032  . enoxaparin (LOVENOX) injection 70 mg  1 mg/kg Subcutaneous Q12H Wilson Singer I, RPH   70 mg at 10/24/20 1442  . ferrous sulfate tablet 325 mg  325 mg Oral Daily Darliss Cheney, MD   325 mg at 10/25/20 1030  . gabapentin (NEURONTIN) capsule 100 mg  100 mg Oral TID Darliss Cheney, MD   100 mg at 10/25/20 1031  . mirtazapine (REMERON) tablet 15 mg  15 mg Oral QHS Adefeso, Oladapo, DO   15 mg at 10/24/20 2126  . pantoprazole (PROTONIX) EC tablet 40 mg  40 mg Oral Daily Adefeso, Oladapo, DO   40 mg at 10/25/20 1029  . traMADol (ULTRAM) tablet 50 mg  50 mg Oral TID PRN Darliss Cheney, MD   50 mg at 10/25/20 1033  . vitamin B-12 (CYANOCOBALAMIN) tablet 100 mcg  100 mcg Oral Daily Adefeso, Oladapo, DO   100 mcg at 10/25/20 1033     Discharge Medications: Please see discharge summary for a list of discharge medications.  Relevant Imaging  Results:  Relevant Lab Results:   Additional Information SS# [id number]. Pt is vaccinated and boosted for covid.  Joanne Chars, LCSW

## 2020-10-25 NOTE — TOC Initial Note (Addendum)
Transition of Care [facility]) - Initial/Assessment Note    Patient Details  Name: [name] [name] MRN: [id number] Date of Birth: 02-12-1941  Transition of Care [facility]) CM/SW Contact:    Joanne Chars, LCSW Phone Number: 10/25/2020, 12:53 PM  Clinical Narrative:  CSW met with pt regarding recommendation for SNF.  Pt reports she was recently at [facility] for rehab and is in agreement with return to SNF, would like to return to [facility]  Permission given to speak with grandson [name] [phone]), daughter  [name], and sister [name].  Pt has [facility] services currently in place, unable to remember which agency.  Current equipment in home: 2 rollators, shower chair, bedside commode.  Pt is vaccinated for covid and boosted.    Could not get PASSR from website, [place] Must phone assist provided number: [phone] E.    6962: SNF auth request made to HTA.    1530:  CSW met with pt, pt grandson [name], and Joshua's girlfriend in room, updated on current plan.  If SNF not approved, [name] and girlfriend are willing to have pt stay with them, can provide 24/7 support.              Expected Discharge Plan: [place] Barriers to Discharge: Continued Medical Work up   Patient Goals and CMS Choice Patient states their goals for this hospitalization and ongoing recovery are:: "go back to [facility]" CMS Medicare.gov Compare Post Acute Care list provided to:: Patient Choice offered to / list presented to : Patient  Expected Discharge Plan and Services Expected Discharge Plan: [place] Choice: [place] arrangements for the past 2 months: Single Family Home                                      Prior Living Arrangements/Services Living arrangements for the past 2 months: Single Family Home Lives with:: Self Patient language and need for interpreter reviewed:: Yes Do you feel safe going back to the place where  you live?: Yes      Need for Family Participation in Patient Care: No (Comment) Care giver support system in place?: Yes (comment) Current home services: Home OT,Home PT Criminal Activity/Legal Involvement Pertinent to Current Situation/Hospitalization: No - Comment as needed  Activities of Daily Living      Permission Sought/Granted Permission sought to share information with : Family Supports Permission granted to share information with : Yes, Verbal Permission Granted  Share Information with NAME: daughter [name], sister Earlie Server [phone])  Permission granted to share info w AGENCY: SNF        Emotional Assessment Appearance:: Appears stated age Attitude/Demeanor/Rapport: Engaged Affect (typically observed): Appropriate,Pleasant Orientation: : Oriented to Self,Oriented to Place,Oriented to Situation Alcohol / Substance Use: Not Applicable Psych Involvement: No (comment)  Admission diagnosis:  Shortness of breath [R06.02] Patient Active Problem List   Diagnosis Date Noted  . Shortness of breath 10/23/2020  . Elevated d-dimer 10/23/2020  . Failure to thrive in adult 10/23/2020  . Obesity (BMI 30.0-34.9) 10/23/2020  . Thrombocytopenia ([place]) 09/02/2020  . Sepsis with acute hypoxic respiratory failure ([place]) 09/01/2020  . Ambulatory dysfunction 09/07/2019  . Acute respiratory failure with hypoxia ([place]) 09/06/2019  . Essential hypertension 09/06/2019  . Dyslipidemia 09/06/2019  . CKD (chronic kidney disease) stage 3, GFR 30-59 ml/min (HCC) 09/06/2019  . Tremor 09/06/2019  . Acute  kidney injury superimposed on chronic kidney disease ([place]) 09/06/2019  . Fall at home, initial encounter 09/06/2019  . Homicidal ideation   . Psychosocial stressors 04/03/2018  . AKI (acute kidney injury) ([place]) 04/03/2018  . Primary open angle glaucoma (POAG) of both eyes, severe stage 12/25/2017  . Polypharmacy 11/11/2017  . Pneumonia of right lower lobe due to infectious  organism 11/11/2017  . Congenital absence of one kidney 09/05/2017  . Macrocytic anemia 12/19/2016  . Anemia in chronic kidney disease 09/24/2016  . Stage 3 chronic kidney disease ([place]) 09/24/2016  . Peripheral vascular disease ([place])   . Generalized weakness 10/18/2015  . Acrochordon 08/03/2015  . Stasis dermatitis of both legs 01/18/2015  . Radiculopathy of lumbar region 01/06/2015  . Seborrheic keratosis 11/24/2014  . Dehydration 10/19/2014  . OSA (obstructive sleep apnea) 09/21/2014  . Osteoporosis 06/29/2014  . Thyroid nodule 06/29/2014  . Breast calcifications 06/29/2014  . Glaucoma 01/08/2014  . Basal cell carcinoma of scalp 11/12/2013  . Depression 10/22/2013  . Presbyopia 10/22/2013  . Resting tremor 10/14/2013  . Polymyalgia rheumatica ([place]) 09/11/2013  . GERD (gastroesophageal reflux disease) 09/11/2013  . Major depressive disorder, recurrent episode, severe ([place]) 05/14/2013  . Pulmonary nodules 05/24/2012  . Carotid bruit 04/30/2012  . Essential hypertension 04/27/2012  . Anxiety 04/27/2012  . Mixed hyperlipidemia 04/27/2012  . Cervical spondylosis with radiculopathy 04/19/2012   PCP:  [name], MD Pharmacy:   [facility] [phone] - [address], [address] AT [facility] OF [address] [place] [address] Phone: [phone] Fax: [id number] [place] [address] Phone: [phone] Fax: [phone]     Social Determinants of Health (SDOH) Interventions    Readmission Risk Interventions No flowsheet data found.

## 2020-10-25 NOTE — Progress Notes (Signed)
  Echocardiogram 2D Echocardiogram has been performed.  Elmer Ramp 10/25/2020, 12:57 PM

## 2020-10-25 NOTE — Evaluation (Signed)
Occupational Therapy Evaluation Patient Details Name: [name] [name] MRN: [id number] DOB: 17-Dec-1940 Today's Date: 10/25/2020    History of Present Illness [name] is a 80 y/o female who was sent to ED on 10/23/20 after her home health nurse noticed hypoxia and an irregular HR. Pt admitted due to SOB with further work up pending. Pt with recent admission on 09/01/20 for sepsis secondary to PNA. PMH includes CKD, congenital solitary kideny, HTN, and HLD.   Clinical Impression   Pt PTA: Pt living alone and daughter lives nearby, but cannot assist very much as she works 2 jobs. Pt has other family in the area to assist. Pt currently, limited by decreased strength, decreased ability to care for self and decreased mobility. Pt with pain in BLEs and nauseous after little movement. Pt set-upA to modA for ADL and increased cues to complete tasks. Pt minguardA to modA for mobility with RW. Pt would benefit from continued OT skilled services. OT following acutely.  *Pt wanting to return home with son/grandson's assist.    Follow Up Recommendations  SNF;Supervision/Assistance - 24 hour    Equipment Recommendations  3 in 1 bedside commode    Recommendations for Other Services       Precautions / Restrictions Precautions Precautions: Fall Precaution Comments: monitor O2 Restrictions Weight Bearing Restrictions: No      Mobility Bed Mobility Overal bed mobility: Needs Assistance Bed Mobility: Supine to Sit;Rolling Rolling: Min assist   Supine to sit: Mod assist     General bed mobility comments: Assist for BLE movement and trunk elevation    Transfers Overall transfer level: Needs assistance Equipment used: Rolling walker (2 wheeled) Transfers: Stand Pivot Transfers;Sit to/from Stand Sit to Stand: Min assist Stand pivot transfers: Mod assist       General transfer comment: mobility with cues and assist for a few steps from bed to recliner    Balance Overall balance assessment: Needs  assistance Sitting-balance support: Feet supported;Single extremity supported Sitting balance-Leahy Scale: Fair         Standing balance comment: unable to assess                           ADL either performed or assessed with clinical judgement   ADL Overall ADL's : Needs assistance/impaired Eating/Feeding: Set up;Sitting   Grooming: Set up;Sitting   Upper Body Bathing: Minimal assistance;Sitting   Lower Body Bathing: Moderate assistance;Cueing for safety;Sitting/lateral leans   Upper Body Dressing : Minimal assistance;Sitting   Lower Body Dressing: Moderate assistance;Sitting/lateral leans;Sit to/from stand;Cueing for safety   Toilet Transfer: Minimal assistance;Stand-pivot;BSC;RW   Toileting- Clothing Manipulation and Hygiene: Maximal assistance;Sit to/from stand Toileting - Clothing Manipulation Details (indicate cue type and reason): increased assist due to incontinence     Functional mobility during ADLs: Minimal assistance;Rolling walker;Cueing for safety General ADL Comments: Pt limited by decreased strength, decreased ability to care for self and decreased mobility. Pt with pain in BLEs and nauseous after little movement.     Vision Baseline Vision/History: No visual deficits Patient Visual Report: No change from baseline Vision Assessment?: No apparent visual deficits     Perception     Praxis      Pertinent Vitals/Pain Pain Assessment: 0-10 Pain Score: 5  Pain Location: BLEs sores Pain Descriptors / Indicators: Discomfort;Grimacing Pain Intervention(s): Monitored during session     Hand Dominance Right   Extremity/Trunk Assessment Upper Extremity Assessment Upper Extremity Assessment: Generalized weakness   Lower Extremity Assessment  Lower Extremity Assessment: Defer to PT evaluation;Generalized weakness   Cervical / Trunk Assessment Cervical / Trunk Assessment: Kyphotic   Communication Communication Communication: No  difficulties   Cognition Arousal/Alertness: Awake/alert Behavior During Therapy: WFL for tasks assessed/performed Overall Cognitive Status: No family/caregiver present to determine baseline cognitive functioning                                 General Comments: A/Ox4, requires cues for date. Follows commands with increased time   General Comments  pt on 2L O2 >90% O2.    Exercises     Shoulder Instructions      Home Living Family/patient expects to be discharged to:: Private residence Living Arrangements: Alone Available Help at Discharge: Family;Available PRN/intermittently Type of Home: House Home Access: Ramped entrance Entrance Stairs-Number of Steps: 5 Entrance Stairs-Rails: Right Home Layout: One level     Bathroom Shower/Tub: Teacher, early years/pre: Standard Bathroom Accessibility: Yes   Home Equipment: Walker - 2 wheels;Walker - 4 wheels;Shower seat;Wheelchair - manual;Bedside commode   Additional Comments: Home setup info obtained from 2021 admission as pt poor historian. per current notes, pt's nephew may have been staying with pt though pt reports she lives alone      Prior Functioning/Environment Level of Independence: Independent with assistive device(s)  Gait / Transfers Assistance Needed: Reports able to ambulate with Rollator and enjoys walking outside. Daughter lives 2 houses away, but daughter works 2 jobs. Son and grandson visit 3x/week from [place] and want pt to come live with them 24/7, but daughter hesitant. Pt reports receiving home health PT 3x/week. ADL's / Homemaking Assistance Needed: Pt reports Modified Independence with ADLs, has assist with med mgmt from daughter for bathing. Pt able to dress herself independently.            OT Problem List: Decreased strength;Decreased activity tolerance;Impaired balance (sitting and/or standing);Decreased cognition;Decreased safety awareness;Decreased knowledge of use of DME  or AE;Cardiopulmonary status limiting activity      OT Treatment/Interventions: Self-care/ADL training;Therapeutic exercise;Energy conservation;DME and/or AE instruction;Therapeutic activities;Patient/family education;Balance training    OT Goals(Current goals can be found in the care plan section) Acute Rehab OT Goals Patient Stated Goal: less pain in legs; go home with son's assist OT Goal Formulation: With patient Time For Goal Achievement: 11/08/20 Potential to Achieve Goals: Good ADL Goals Pt Will Perform Grooming: with supervision;standing Pt Will Transfer to Toilet: with supervision;ambulating;regular height toilet Pt/caregiver will Perform Home Exercise Program: Increased strength;Both right and left upper extremity;With Supervision;With written HEP provided Additional ADL Goal #1: Pt to demonstrate ability to attend to a functional tasks ~5 mins with <2 cues. Additional ADL Goal #2: Pt will increase to x10 mins of OOB ADL with O2 sats >90%.  OT Frequency: Min 2X/week   Barriers to D/C: Decreased caregiver support          Co-evaluation              AM-PAC OT "6 Clicks" Daily Activity     Outcome Measure Help from another person eating meals?: None Help from another person taking care of personal grooming?: A Little Help from another person toileting, which includes using toliet, bedpan, or urinal?: A Lot Help from another person bathing (including washing, rinsing, drying)?: A Little Help from another person to put on and taking off regular upper body clothing?: A Little Help from another person to put on and taking off regular  lower body clothing?: A Lot 6 Click Score: 17   End of Session Equipment Utilized During Treatment: Gait belt;Rolling walker;Oxygen Nurse Communication: Mobility status;Other (comment)  Activity Tolerance: Patient tolerated treatment well Patient left: in chair;with call bell/phone within reach;with chair alarm set  OT Visit Diagnosis:  Unsteadiness on feet (R26.81);Other abnormalities of gait and mobility (R26.89);Muscle weakness (generalized) (M62.81);Other symptoms and signs involving cognitive function                Time: 3299-2426 OT Time Calculation (min): 32 min Charges:  OT General Charges $OT Visit: 1 Visit OT Evaluation $OT Eval Moderate Complexity: 1 Mod OT Treatments $Self Care/Home Management : 8-22 mins  Jefferey Pica, OTR/L Acute Rehabilitation Services Pager: [phone] Office: [phone]   Iyari Hagner C 10/25/2020, 6:03 PM

## 2020-10-26 DIAGNOSIS — N179 Acute kidney failure, unspecified: Secondary | ICD-10-CM | POA: Diagnosis not present

## 2020-10-26 DIAGNOSIS — N189 Chronic kidney disease, unspecified: Secondary | ICD-10-CM | POA: Diagnosis not present

## 2020-10-26 LAB — COMPREHENSIVE METABOLIC PANEL
ALT: 7 U/L (ref 0–44)
AST: 10 U/L — ABNORMAL LOW (ref 15–41)
Albumin: 2.6 g/dL — ABNORMAL LOW (ref 3.5–5.0)
Alkaline Phosphatase: 53 U/L (ref 38–126)
Anion gap: 6 (ref 5–15)
BUN: 15 mg/dL (ref 8–23)
CO2: 30 mmol/L (ref 22–32)
Calcium: 9.2 mg/dL (ref 8.9–10.3)
Chloride: 99 mmol/L (ref 98–111)
Creatinine, Ser: 1.24 mg/dL — ABNORMAL HIGH (ref 0.44–1.00)
GFR, Estimated: 44 mL/min — ABNORMAL LOW (ref >60)
Glucose, Bld: 97 mg/dL (ref 70–99)
Potassium: 4.2 mmol/L (ref 3.5–5.1)
Sodium: 135 mmol/L (ref 135–145)
Total Bilirubin: 0.9 mg/dL (ref 0.3–1.2)
Total Protein: 5.8 g/dL — ABNORMAL LOW (ref 6.5–8.1)

## 2020-10-26 LAB — CBC WITH DIFFERENTIAL/PLATELET
Abs Immature Granulocytes: 0.02 10*3/uL (ref 0.00–0.07)
Basophils Absolute: 0 10*3/uL (ref 0.0–0.1)
Basophils Relative: 0 %
Eosinophils Absolute: 0.1 10*3/uL (ref 0.0–0.5)
Eosinophils Relative: 1 %
HCT: 33 % — ABNORMAL LOW (ref 36.0–46.0)
Hemoglobin: 10.7 g/dL — ABNORMAL LOW (ref 12.0–15.0)
Immature Granulocytes: 0 %
Lymphocytes Relative: 27 %
Lymphs Abs: 1.8 10*3/uL (ref 0.7–4.0)
MCH: 33.1 pg (ref 26.0–34.0)
MCHC: 32.4 g/dL (ref 30.0–36.0)
MCV: 102.2 fL — ABNORMAL HIGH (ref 80.0–100.0)
Monocytes Absolute: 0.9 10*3/uL (ref 0.1–1.0)
Monocytes Relative: 14 %
Neutro Abs: 4.1 10*3/uL (ref 1.7–7.7)
Neutrophils Relative %: 58 %
Platelets: 176 10*3/uL (ref 150–400)
RBC: 3.23 MIL/uL — ABNORMAL LOW (ref 3.87–5.11)
RDW: 13.8 % (ref 11.5–15.5)
WBC: 6.9 10*3/uL (ref 4.0–10.5)
nRBC: 0 % (ref 0.0–0.2)

## 2020-10-26 LAB — MAGNESIUM: Magnesium: 1.8 mg/dL (ref 1.7–2.4)

## 2020-10-26 MED ORDER — TIMOLOL MALEATE 0.5 % OP SOLN
1.0000 [drp] | Freq: Two times a day (BID) | OPHTHALMIC | Status: DC
  Administered 2020-10-26 – 2020-11-05 (×21): 1 [drp] via OPHTHALMIC
  Filled 2020-10-26: qty 5

## 2020-10-26 MED ORDER — FUROSEMIDE 10 MG/ML IJ SOLN
40.0000 mg | Freq: Every day | INTRAMUSCULAR | Status: DC
  Administered 2020-10-26 – 2020-10-31 (×6): 40 mg via INTRAVENOUS
  Filled 2020-10-26 (×6): qty 4

## 2020-10-26 MED ORDER — LATANOPROST 0.005 % OP SOLN
1.0000 [drp] | Freq: Every day | OPHTHALMIC | Status: DC
  Administered 2020-10-26 – 2020-11-04 (×10): 1 [drp] via OPHTHALMIC
  Filled 2020-10-26: qty 2.5

## 2020-10-26 MED ORDER — DORZOLAMIDE HCL 2 % OP SOLN
1.0000 [drp] | Freq: Two times a day (BID) | OPHTHALMIC | Status: DC
  Administered 2020-10-26 – 2020-11-05 (×21): 1 [drp] via OPHTHALMIC
  Filled 2020-10-26: qty 10

## 2020-10-26 MED ORDER — ENOXAPARIN SODIUM 30 MG/0.3ML ~~LOC~~ SOLN
30.0000 mg | SUBCUTANEOUS | Status: DC
  Administered 2020-10-27 – 2020-11-01 (×5): 30 mg via SUBCUTANEOUS
  Filled 2020-10-26 (×6): qty 0.3

## 2020-10-26 NOTE — H&P (Signed)
PROGRESS NOTE    [name] [name]  YSA:[id number] DOB: Feb 11, 1941 DOA: 10/23/2020 PCP: [name], MD   Brief Narrative:  [name] [name] is a 80 y.o. female with medical history significant for CKD 3B, congenital solitary kidney hypertension, hyperlipidemia and peripheral vascular disease who presented to the ED due to generalized weakness and shortness of breath.   she was admitted to this hospital in February due to pneumonia and was then discharged to a skilled nursing facility, she was discharged from the nursing facility about 1 month ago.  Patient complained of cough and shortness of breath when she lays flat and that she occasionally coughs up a blackish sputum.  Patient states that she has not been eating and drinking enough over the past few weeks and that her blood pressure usually  increase while shortness of breath worsens when she stands.  Patient complained of rashes on both legs which appears to be dried blister wounds and bilateral erythematous rash of similar pattern (chronic venous stasis dermatitis.  Patient also complained of mild epigastric pain which worsens when she eats.  She lives alone, but daughter lives about 3 houses from her and was always around to assist patient.  Recent hospitalization: 09/01/2020 to 09/10/2020 Sepsis with acute hypoxic respiratory failure due to pneumonia and altered mental status. She was treated with 5 days of antibiotics with MRSA coverage and was discharged to a SNF  In the emergency department, she was hemodynamically stable.  Work-up done in the ED showed elevated MCV (100.8), BUN/creatinine 16/1.52 (baseline creatinine at 0.9-1.3), D-dimer 1.04, troponin x2 was flat at 7, BNP 18.8, lactic acid 0.8 Chest x-ray showed resolution of previously seen right basilar pneumonia.  She was admitted to hospital service.  Due to elevated creatinine,CT angiogram of the chest was not done and VQ scan was ordered however patient could not complete that test either.   Doppler lower extremity ruled out DVT. Subsequently on 10/25/2020, patient complained of pleuritic chest pain.  She underwent CT angiogram of chest and PE was ruled out.  Assessment & Plan:   Active Problems:   Anxiety   Mixed hyperlipidemia   GERD (gastroesophageal reflux disease)   Depression   Dehydration   Stasis dermatitis of both legs   Generalized weakness   AKI (acute kidney injury) ([place])   Essential hypertension   Acute kidney injury superimposed on chronic kidney disease (HCC)   Shortness of breath   Elevated d-dimer   Failure to thrive in adult   Obesity (BMI 30.0-34.9)   Acute hypoxic respiratory failure: Elevated D-dimer.  She could not complete/lay flat for 20-minute for VQ scan to be completed.  Doppler lower extremity ruled out DVT so anticoagulation was stopped.  On 10/25/2020, she complained of pleuritic chest pain today and was having problem taking deep breaths.  CT angiogram of the chest was done and she was ruled out of PE.  Discontinue full dose anticoagulation.  Although she had low-grade fever of 100.7 last evening.  She feels better today.  She is only on 1 L of oxygen.  Denies shortness of breath or chest pain.  We will try some diuretic challenge.  Generalized weakness in the setting of failure to thrive in adult: Patient has had poor and oral intake since returning from SNF.  PT OT on board.  They recommend SNF.  TOC has been informed.  Dysuria/acute urinary retention: Patient was complaining of retention.  She was having some dribbling and some dysuria.  UA negative for  acute infection.  She was straight cathed.  Had retention again and indwelling Foley catheter was placed.  We will discontinue Foley catheter today and do voiding trial.  Hypomagnesemia: Resolved.  Hypokalemia: Resolved.  Acute kidney injury superimposed on CKD stage IIIa/dehydration: Baseline creatinine around 1.06 with GFR of 53.  Presented with creatinine of 1.52 with GFR of 34.   Likely due to dehydration.  Improved and seems to be back to her baseline.  Stasis dermatitis of both legs with multiple scabs.  Patient states that she developed all those during her recent hospitalization.  There is stable so continue wound care.  Essential hypertension: Controlled Continue amlodipine  Macrocytic anemia Continue vitamin B12  GERD Continue Protonix  Depression and anxiety Continue buspirone and Remeron  Obesity(BMI 30.61):  counseled on diet and nursing medication  DVT prophylaxis: enoxaparin (LOVENOX) injection 30 mg Start: 10/27/20 0800 SCDs Start: 10/23/20 1917   Code Status: DNR  Family Communication: None present at bedside.  Plan of care discussed with patient in length and he verbalized understanding and agreed with it.  Status is: Inpatient  Remains inpatient appropriate because:Ongoing diagnostic testing needed not appropriate for outpatient work up   Dispo: The patient is from: Home              Anticipated d/c is to: SNF              Patient currently is not medically stable to d/c.   Difficult to place patient No        Estimated body mass index is 30.61 kg/m as calculated from the following:   Height as of 09/01/20: 5' (1.524 m).   Weight as of this encounter: 71.1 kg.      Nutritional status:               Consultants:   None  Procedures:   None  Antimicrobials:  Anti-infectives (From admission, onward)   None         Subjective: Patient seen and examined.  She is much more calm and comfortable today.  Denies any shortness of breath, chest pain or any other complaint.  Objective: Vitals:   10/25/20 1135 10/25/20 1629 10/25/20 2100 10/26/20 0731  BP: 117/78 (!) 123/53 91/60 131/72  Pulse:  (!) 104 (!) 101   Resp:  16    Temp: (!) 100.7 F (38.2 C) 98.4 F (36.9 C) 98.7 F (37.1 C) 98.1 F (36.7 C)  TempSrc: Oral  Oral Oral  SpO2:  97%  94%  Weight:        Intake/Output Summary (Last 24  hours) at 10/26/2020 1100 Last data filed at 10/26/2020 0406 Gross per 24 hour  Intake --  Output 1250 ml  Net -1250 ml   Filed Weights   10/23/20 2040  Weight: 71.1 kg    Examination:  General exam: Appears calm and comfortable  Respiratory system: Diminished breath sounds with rhonchi bilaterally at bases. Respiratory effort normal. Cardiovascular system: S1 & S2 heard, RRR. No JVD, murmurs, rubs, gallops or clicks.  +1 pitting edema bilateral lower extremity. Gastrointestinal system: Abdomen is nondistended, soft and nontender. No organomegaly or masses felt. Normal bowel sounds heard. Central nervous system: Alert and oriented. No focal neurological deficits. Extremities: Multiple scabs and stasis dermatitis in bilateral lower extremities. Skin: No rashes, lesions or ulcers.  Psychiatry: Judgement and insight appear normal. Mood & affect appropriate.    Data Reviewed: I have personally reviewed following labs and imaging studies  CBC: Recent  Labs  Lab 10/23/20 1426 10/24/20 0502 10/25/20 0353 10/26/20 0253  WBC 9.3 6.1 7.7 6.9  NEUTROABS 6.3  --  4.9 4.1  HGB 12.9 12.1 11.7* 10.7*  HCT 37.8 35.3* 34.9* 33.0*  MCV 100.8* 100.9* 101.7* 102.2*  PLT 198 169 180 825   Basic Metabolic Panel: Recent Labs  Lab 10/23/20 1426 10/24/20 0502 10/25/20 0353 10/26/20 0253  NA 138 138 139 135  K 3.6 2.9* 4.2 4.2  CL 97* 99 102 99  CO2 31 30 29 30   GLUCOSE 87 79 101* 97  BUN 16 11 16 15   CREATININE 1.52* 1.21* 1.23* 1.24*  CALCIUM 9.1 9.0 9.4 9.2  MG  --  1.5* 2.0 1.8  PHOS  --  3.3  --   --    GFR: Estimated Creatinine Clearance: 31.3 mL/min (A) (by C-G formula based on SCr of 1.24 mg/dL (H)). Liver Function Tests: Recent Labs  Lab 10/24/20 0502 10/25/20 0353 10/26/20 0253  AST 13* 12* 10*  ALT 7 8 7   ALKPHOS 63 56 53  BILITOT 1.5* 0.9 0.9  PROT 6.5 6.1* 5.8*  ALBUMIN 3.4* 2.9* 2.6*   No results for input(s): LIPASE, AMYLASE in the last 168 hours. No  results for input(s): AMMONIA in the last 168 hours. Coagulation Profile: Recent Labs  Lab 10/24/20 0502  INR 1.1   Cardiac Enzymes: No results for input(s): CKTOTAL, CKMB, CKMBINDEX, TROPONINI in the last 168 hours. BNP (last 3 results) No results for input(s): PROBNP in the last 8760 hours. HbA1C: No results for input(s): HGBA1C in the last 72 hours. CBG: No results for input(s): GLUCAP in the last 168 hours. Lipid Profile: No results for input(s): CHOL, HDL, LDLCALC, TRIG, CHOLHDL, LDLDIRECT in the last 72 hours. Thyroid Function Tests: No results for input(s): TSH, T4TOTAL, FREET4, T3FREE, THYROIDAB in the last 72 hours. Anemia Panel: No results for input(s): VITAMINB12, FOLATE, FERRITIN, TIBC, IRON, RETICCTPCT in the last 72 hours. Sepsis Labs: Recent Labs  Lab 10/23/20 1426 10/23/20 1607  LATICACIDVEN 1.1 0.8    Recent Results (from the past 240 hour(s))  Blood culture (routine x 2)     Status: None (Preliminary result)   Collection Time: 10/23/20  2:10 PM   Specimen: BLOOD  Result Value Ref Range Status   Specimen Description BLOOD SITE NOT SPECIFIED  Final   Special Requests   Final    BOTTLES DRAWN AEROBIC AND ANAEROBIC Blood Culture adequate volume   Culture   Final    NO GROWTH 3 DAYS Performed at [facility], [address], [place], [place] [address]    Report Status PENDING  Incomplete  Blood culture (routine x 2)     Status: None (Preliminary result)   Collection Time: 10/23/20  2:26 PM   Specimen: BLOOD  Result Value Ref Range Status   Specimen Description BLOOD SITE NOT SPECIFIED  Final   Special Requests   Final    BOTTLES DRAWN AEROBIC AND ANAEROBIC Blood Culture adequate volume   Culture   Final    NO GROWTH 3 DAYS Performed at [facility], [address], [place], [place] [address]    Report Status PENDING  Incomplete      Radiology Studies: CT ANGIO CHEST PE W OR WO CONTRAST  Result Date: 10/25/2020 CLINICAL DATA:   Shortness of breath. EXAM: CT ANGIOGRAPHY CHEST WITH CONTRAST TECHNIQUE: Multidetector CT imaging of the chest was performed using the standard protocol during bolus administration of intravenous contrast.  Multiplanar CT image reconstructions and MIPs were obtained to evaluate the vascular anatomy. CONTRAST:  59mL OMNIPAQUE IOHEXOL 350 MG/ML SOLN COMPARISON:  September 06, 2019. FINDINGS: Cardiovascular: Satisfactory opacification of the pulmonary arteries to the segmental level. No evidence of pulmonary embolism. Normal heart size. No pericardial effusion. Atherosclerosis of thoracic aorta is noted without aneurysm formation. Mild coronary artery calcifications are noted. Mediastinum/Nodes: Stable 2 cm left thyroid nodule is noted. Esophagus is unremarkable. No adenopathy is noted. Lungs/Pleura: No pneumothorax or pleural effusion is noted. Mild bibasilar subsegmental atelectasis is noted. 7 mm nodule is noted in superior segment of right lower lobe best seen on image number 53 of series 6. Upper Abdomen: No acute abnormality. Musculoskeletal: No chest wall abnormality. No acute or significant osseous findings. Review of the MIP images confirms the above findings. IMPRESSION: No definite evidence of pulmonary embolus. 2 cm left thyroid nodule is noted. Recommend thyroid US. (Ref: J Am Coll Radiol. 2015 Feb;12(2): 143-50). 7 mm nodule noted in right lower lobe. Non-contrast chest CT at 6-12 months is recommended. If the nodule is stable at time of repeat CT, then future CT at 18-24 months (from today's scan) is considered optional for low-risk patients, but is recommended for high-risk patients. This recommendation follows the consensus statement: Guidelines for Management of Incidental Pulmonary Nodules Detected on CT Images: From the Fleischner Society 2017; Radiology 2017; 284:228-243. Mild coronary calcifications are noted. Mild bibasilar subsegmental atelectasis is noted. Aortic Atherosclerosis (ICD10-I70.0).  Electronically Signed   By: Marijo Conception M.D.   On: 10/25/2020 13:44   ECHOCARDIOGRAM COMPLETE  Result Date: 10/25/2020    ECHOCARDIOGRAM REPORT   Patient Name:   QUADASIA NEWSHAM Dickens Date of Exam: 10/25/2020 Medical Rec #:  [id number]   Height:       60.0 in Accession #:    [id number]  Weight:       156.7 lb Date of Birth:  Mar 05, 1941   BSA:          1.683 m Patient Age:    68 years    BP:           117/78 mmHg Patient Gender: F           HR:           111 bpm. Exam Location:  Inpatient Procedure: 2D Echo, Cardiac Doppler and Color Doppler Indications:    I50.9* Heart failure (unspecified)  History:        Patient has prior history of Echocardiogram examinations, most                 recent 08/26/2016. Signs/Symptoms:Shortness of Breath; Risk                 Factors:Hypertension.  Sonographer:    Merrie Roof RDCS Referring Phys: [id number] [place]  1. Left ventricular ejection fraction, by estimation, is 60 to 65%. The left ventricle has normal function. The left ventricle has no regional wall motion abnormalities. There is mild concentric left ventricular hypertrophy with focal discreet thickening of the basal septal segment. Left ventricular diastolic parameters are consistent with Grade I diastolic dysfunction (impaired relaxation).  2. Right ventricular systolic function is normal. The right ventricular size is normal. Tricuspid regurgitation signal is inadequate for assessing PA pressure.  3. The mitral valve is grossly normal. Trivial mitral valve regurgitation.  4. The aortic valve was not well visualized. Aortic valve regurgitation is not visualized. No aortic stenosis is present. Comparison(s): No prior Echocardiogram. FINDINGS  Left Ventricle: Left ventricular ejection fraction, by estimation, is 60 to 65%. The left ventricle has normal function. The left ventricle has no regional wall motion abnormalities. The left ventricular internal cavity size was normal in size. There is  mild concentric  left ventricular hypertrophy with focal discreet thickening of the basal septal segment. Left ventricular diastolic parameters are consistent with Grade I diastolic dysfunction (impaired relaxation). Right Ventricle: The right ventricular size is normal. Right vetricular wall thickness was not well visualized. Right ventricular systolic function is normal. Tricuspid regurgitation signal is inadequate for assessing PA pressure. Left Atrium: Left atrial size was normal in size. Right Atrium: Right atrial size was normal in size. Pericardium: There is no evidence of pericardial effusion. Mitral Valve: The mitral valve is grossly normal. Trivial mitral valve regurgitation. Tricuspid Valve: The tricuspid valve is normal in structure. Tricuspid valve regurgitation is trivial. Aortic Valve: The aortic valve was not well visualized. Aortic valve regurgitation is not visualized. No aortic stenosis is present. Pulmonic Valve: The pulmonic valve was not well visualized. Pulmonic valve regurgitation is trivial. Aorta: The aortic root and ascending aorta are structurally normal, with no evidence of dilitation. IAS/Shunts: No atrial level shunt detected by color flow Doppler.  LEFT VENTRICLE PLAX 2D LVIDd:         3.50 cm  Diastology LVIDs:         2.30 cm  LV e' medial:    6.85 cm/s LV PW:         1.00 cm  LV E/e' medial:  10.4 LV IVS:        1.00 cm  LV e' lateral:   7.40 cm/s LVOT diam:     2.00 cm  LV E/e' lateral: 9.6 LV SV:         45 LV SV Index:   27 LVOT Area:     3.14 cm  RIGHT VENTRICLE RV Basal diam:  2.90 cm LEFT ATRIUM             Index       RIGHT ATRIUM           Index LA diam:        3.00 cm 1.78 cm/m  RA Area:     10.90 cm LA Vol (A2C):   23.1 ml 13.73 ml/m RA Volume:   21.10 ml  12.54 ml/m LA Vol (A4C):   38.6 ml 22.94 ml/m LA Biplane Vol: 29.7 ml 17.65 ml/m  AORTIC VALVE LVOT Vmax:   93.30 cm/s LVOT Vmean:  64.700 cm/s LVOT VTI:    0.142 m  AORTA Ao Root diam: 2.60 cm Ao Asc diam:  3.00 cm MITRAL VALVE  MV Area (PHT): 4.10 cm     SHUNTS MV Decel Time: 185 msec     Systemic VTI:  0.14 m MV E velocity: 71.10 cm/s   Systemic Diam: 2.00 cm MV A velocity: 131.00 cm/s MV E/A ratio:  0.54 Gwyndolyn Kaufman MD Electronically signed by Gwyndolyn Kaufman MD Signature Date/Time: 10/25/2020/2:36:26 PM    Final    VAS [place] LOWER EXTREMITY VENOUS (DVT)  Result Date: 10/25/2020  Lower Venous DVT Study Indications: SOB, and Edema.  Limitations: Body habitus and skin texture. Comparison Study: No prior study on file Performing Technologist: Sharion Dove RVS  Examination Guidelines: A complete evaluation includes B-mode imaging, spectral Doppler, color Doppler, and power Doppler as needed of all accessible portions of each vessel. Bilateral testing is considered an integral part of a complete examination. Limited examinations  for reoccurring indications may be performed as noted. The reflux portion of the exam is performed with the patient in reverse Trendelenburg.  +---------+---------------+---------+-----------+----------+-------------------+ RIGHT    CompressibilityPhasicitySpontaneityPropertiesThrombus Aging      +---------+---------------+---------+-----------+----------+-------------------+ CFV      Full           Yes      Yes                                      +---------+---------------+---------+-----------+----------+-------------------+ SFJ      Full                                                             +---------+---------------+---------+-----------+----------+-------------------+ FV Prox  Full                                                             +---------+---------------+---------+-----------+----------+-------------------+ FV Mid   Full                                                             +---------+---------------+---------+-----------+----------+-------------------+ FV DistalFull                                                              +---------+---------------+---------+-----------+----------+-------------------+ PFV      Full                                                             +---------+---------------+---------+-----------+----------+-------------------+ POP      Full           Yes      Yes                                      +---------+---------------+---------+-----------+----------+-------------------+ PTV      Full                                                             +---------+---------------+---------+-----------+----------+-------------------+ PERO  Not well visualized +---------+---------------+---------+-----------+----------+-------------------+   +---------+---------------+---------+-----------+----------+-------------------+ LEFT     CompressibilityPhasicitySpontaneityPropertiesThrombus Aging      +---------+---------------+---------+-----------+----------+-------------------+ CFV      Full           Yes      Yes                                      +---------+---------------+---------+-----------+----------+-------------------+ SFJ      Full                                                             +---------+---------------+---------+-----------+----------+-------------------+ FV Prox  Full                                                             +---------+---------------+---------+-----------+----------+-------------------+ FV Mid   Full                                                             +---------+---------------+---------+-----------+----------+-------------------+ FV DistalFull           Yes      Yes                                      +---------+---------------+---------+-----------+----------+-------------------+ PFV                                                   Not well visualized +---------+---------------+---------+-----------+----------+-------------------+  POP      Full           Yes      Yes                                      +---------+---------------+---------+-----------+----------+-------------------+ PTV      Full                                                             +---------+---------------+---------+-----------+----------+-------------------+ PERO                                                  Not well visualized +---------+---------------+---------+-----------+----------+-------------------+     Summary: RIGHT: - There is no evidence of deep vein thrombosis in the lower extremity. However, portions of  this examination were limited- see technologist comments above.  LEFT: - There is no evidence of deep vein thrombosis in the lower extremity. However, portions of this examination were limited- see technologist comments above.  - Ultrasound characteristics of a ruptured Baker's Cyst are noted.  *See table(s) above for measurements and observations. Electronically signed by Ruta Hinds MD on 10/25/2020 at 12:53:40 PM.    Final     Scheduled Meds: . amLODipine  5 mg Oral Daily  . aspirin  81 mg Oral Daily  . busPIRone  15 mg Oral TID  . Chlorhexidine Gluconate Cloth  6 each Topical Daily  . cholecalciferol  1,000 Units Oral Daily  . clopidogrel  75 mg Oral q morning  . divalproex  125 mg Oral BID  . dorzolamide  1 drop Both Eyes BID   And  . timolol  1 drop Both Eyes BID  . [START ON 10/27/2020] enoxaparin (LOVENOX) injection  30 mg Subcutaneous Q24H  . ferrous sulfate  325 mg Oral Daily  . gabapentin  100 mg Oral TID  . latanoprost  1 drop Both Eyes QHS  . mirtazapine  15 mg Oral QHS  . pantoprazole  40 mg Oral Daily  . vitamin B-12  100 mcg Oral Daily   Continuous Infusions:   LOS: 3 days   Time spent: 30 minutes   Darliss Cheney, MD [facility]  10/26/2020, 11:00 AM   To contact the attending provider between 7A-7P or the covering provider during after hours 7P-7A, please log into the web  site [email or web address].

## 2020-10-26 NOTE — TOC Progression Note (Addendum)
Transition of Care [facility]) - Progression Note    Patient Details  Name: [name] [name] MRN: [id number] Date of Birth: 14-Mar-1941  Transition of Care [facility]) CM/SW Contact  Joanne Chars, LCSW Phone Number: 10/26/2020, 1:42 PM  Clinical Narrative: HTA does not approve SNF authorization.  Message from Dr Rockne Menghini that pt was removed early/unsafely from last SNF admit and is appropriate for staying with family or LTC.  Peer to peer will not be pursued.    Pt informed and would like to proceed with plan to stay with grandson.  CSW spoke with grandson [name], informed him of SNF denial.  He is ready for pt to stay with him and said this can be her long term plan.  His address: [place], [place], [place] [address].    Per Patient [name] is current [facility] provider.  CSW spoke with Stacie at [facility], confirmed they are provider.  They do cover [name] and confirm tomorrow.       Expected Discharge Plan: [place] Barriers to Discharge: Continued Medical Work up  Expected Discharge Plan and Services Expected Discharge Plan: [place] Choice: [place] arrangements for the past 2 months: Single Family Home                                       Social Determinants of Health (SDOH) Interventions    Readmission Risk Interventions No flowsheet data found.

## 2020-10-26 NOTE — Progress Notes (Signed)
Physical Therapy Treatment Patient Details Name: [name] [name] MRN: [id number] DOB: Nov 03, 1940 Today's Date: 10/26/2020    History of Present Illness [name] [name] is a 80 y/o female who was sent to ED on 10/23/20 after her home health nurse noticed hypoxia and an irregular HR. Pt admitted due to SOB with further work up pending. Pt with recent admission on 09/01/20 for sepsis secondary to PNA. PMH includes CKD, congenital solitary kideny, HTN, and HLD.    PT Comments    Patient requires incr time with all mobility due to bil LE pain (pt with multiple scabs to bil LEs and reports she broke out with blisters when she had pneumonia). She is primarily limited in her ambulation by elevated HR (up to 136 bpm after walking 2 feet from bed to chair). She is now agreeing to SNF for additional therapy.      Follow Up Recommendations  SNF;Supervision for mobility/OOB;Other (comment) (pt now agrees to need for SNF)     Equipment Recommendations  None recommended by PT    Recommendations for Other Services       Precautions / Restrictions Precautions Precautions: Fall Precaution Comments: monitor O2 Restrictions Weight Bearing Restrictions: No    Mobility  Bed Mobility Overal bed mobility: Needs Assistance Bed Mobility: Supine to Sit     Supine to sit: Mod assist     General bed mobility comments: Assist for BLE movement and scooting hips to EOB    Transfers Overall transfer level: Needs assistance Equipment used: Rolling walker (2 wheeled) Transfers: Sit to/from Stand Sit to Stand: Min assist         General transfer comment: mobility with cues and assist for managing RW as stepping to recliner  Ambulation/Gait Ambulation/Gait assistance: Min assist Gait Distance (Feet): 2 Feet Assistive device: Rolling walker (2 wheeled) Gait Pattern/deviations: Shuffle Gait velocity: slow       Stairs             Wheelchair Mobility    Modified Rankin (Stroke Patients Only)        Balance Overall balance assessment: Needs assistance Sitting-balance support: Feet supported;Single extremity supported Sitting balance-Leahy Scale: Fair     Standing balance support: Bilateral upper extremity supported;During functional activity Standing balance-Leahy Scale: Poor Standing balance comment: requires UE support                            Cognition Arousal/Alertness: Awake/alert Behavior During Therapy: WFL for tasks assessed/performed Overall Cognitive Status: No family/caregiver present to determine baseline cognitive functioning Area of Impairment: Problem solving                             Problem Solving: Difficulty sequencing;Requires verbal cues;Slow processing General Comments: oriented; slight delay in processing and following commands      Exercises General Exercises - Lower Extremity Ankle Circles/Pumps: AROM;Both;5 reps Long Arc Quad: AROM;Both;5 reps;Seated    General Comments General comments (skin integrity, edema, etc.): on 2L with sats >90% and max HR 136bpm during standing/walking to chair      Pertinent Vitals/Pain Pain Assessment: Faces Faces Pain Scale: Hurts little more Pain Location: BLEs sores Pain Descriptors / Indicators: Discomfort;Sore Pain Intervention(s): Limited activity within patient's tolerance    Home Living Family/patient expects to be discharged to:: Private residence Living Arrangements: Alone Available Help at Discharge: Family;Available PRN/intermittently Type of Home: House Home Access: Ramped entrance Entrance Stairs-Rails: Right  Home Layout: One level Home Equipment: Walker - 2 wheels;Walker - 4 wheels;Shower seat;Wheelchair - manual;Bedside commode Additional Comments: Home setup info obtained from 2021 admission as pt poor historian. per current notes, pt's nephew may have been staying with pt though pt reports she lives alone    Prior Function Level of Independence: Independent  with assistive device(s)  Gait / Transfers Assistance Needed: Reports able to ambulate with Rollator and enjoys walking outside. Daughter lives 2 houses away, but daughter works 2 jobs. Son and grandson visit 3x/week from [place] and want pt to come live with them 24/7, but daughter hesitant. Pt reports receiving home health PT 3x/week. ADL's / Homemaking Assistance Needed: Pt reports Modified Independence with ADLs, has assist with med mgmt from daughter for bathing. Pt able to dress herself independently. Comments: Unsure of accuracy as pt poor historian   PT Goals (current goals can now be found in the care plan section) Acute Rehab PT Goals Patient Stated Goal: less pain in legs; go home with son's assist Time For Goal Achievement: 11/07/20 Potential to Achieve Goals: Good Progress towards PT goals: Progressing toward goals    Frequency    Min 3X/week      PT Plan Current plan remains appropriate    Co-evaluation              AM-PAC PT "6 Clicks" Mobility   Outcome Measure  Help needed turning from your back to your side while in a flat bed without using bedrails?: A Lot Help needed moving from lying on your back to sitting on the side of a flat bed without using bedrails?: A Lot Help needed moving to and from a bed to a chair (including a wheelchair)?: A Little Help needed standing up from a chair using your arms (e.g., wheelchair or bedside chair)?: A Little Help needed to walk in hospital room?: A Lot Help needed climbing 3-5 steps with a railing? : Total 6 Click Score: 13    End of Session Equipment Utilized During Treatment: Gait belt Activity Tolerance: Treatment limited secondary to medical complications (Comment) (elevated HR) Patient left: with call bell/phone within reach;in chair;with chair alarm set Nurse Communication: Mobility status;Other (comment) (Tachycardic) PT Visit Diagnosis: Unsteadiness on feet (R26.81);Muscle weakness (generalized) (M62.81)      Time: 6226-3335 PT Time Calculation (min) (ACUTE ONLY): 43 min  Charges:  $Therapeutic Activity: 38-52 mins                      [name], PT Pager [phone]    [name] 10/26/2020, 9:49 AM

## 2020-10-26 NOTE — Plan of Care (Signed)
Clinical measurement: Ability to maintain a body temp within normal range.   Progress: Pt has maintained a normal body temp with out need for medication at this time.

## 2020-10-26 NOTE — Progress Notes (Signed)
ANTICOAGULATION CONSULT NOTE - Initial Consult  Pharmacy Consult for enoxaparin x1 dose Indication: VTE treatment pending VQ scan   Allergies  Allergen Reactions  . Statins   . Gabapentin Itching    Was deleted since patient was taking, but now experiencing itching again.   . Statins Swelling, Rash and Other (See Comments)    Swelling involving tongue; also causes muscle pain    Patient Measurements: Weight 71.1kg Height 152.4 cm  Vital Signs: Temp: 98.1 F (36.7 C) (04/19 0731) Temp Source: Oral (04/19 0731) BP: 131/72 (04/19 0731)  Labs: Recent Labs    10/23/20 1426 10/23/20 1609 10/24/20 0502 10/25/20 0353 10/26/20 0253  HGB 12.9  --  12.1 11.7* 10.7*  HCT 37.8  --  35.3* 34.9* 33.0*  PLT 198  --  169 180 176  APTT  --   --  46*  --   --   LABPROT  --   --  13.9  --   --   INR  --   --  1.1  --   --   CREATININE 1.52*  --  1.21* 1.23* 1.24*  TROPONINIHS 7 7  --   --   --     Estimated Creatinine Clearance: 31.3 mL/min (A) (by C-G formula based on SCr of 1.24 mg/dL (H)).   Medical History: Past Medical History:  Diagnosis Date  . Anxiety   . Arthritis    RA  . Carpal tunnel syndrome, bilateral   . Chronic kidney disease    CKD stage III, absence of left kidney  . CKD (chronic kidney disease) stage 3, GFR 30-59 ml/min (HCC) 09/06/2019  . Congenital absence of one kidney    Pt has right kidney only  . Depression   . Dyslipidemia 09/06/2019  . Essential hypertension 09/06/2019  . GERD (gastroesophageal reflux disease)   . Headache   . Heart murmur   . Hypercholesteremia   . Hypertension   . Peripheral vascular disease ([place])    a. s/p R external iliac stent '06 with angioplasty '13 (Dr. Benjamine Sprague) b. 01/2016: s/p PTA/stenting in L common iliac artery (Dr. Gwenlyn Found)  . Polymyalgia rheumatica ([place])   . Psoriasis   . Tremor 09/06/2019    Assessment: 80 yo W admitted for AMS in the setting of pneumonia. No anticoagulation prior to admission. Pharmacy consulted  for one enoxaparin treatment dose pending VQ scan to rule out PE but patient unable to complete scan. Patient was continued on treatment dose enoxaparin and CT Angio was done on 4/18 with no definite sign of PE.   ClCr ~ 30 ml/min on 1mg /kg Q12 hr enoxaparin. H/H, plt wnl. Discussed with MD, given CT Angio result, ok to switch to prophylactic dosing. Renal function is borderline, will select lower dose.  Goal of Therapy:  Monitor platelets by anticoagulation protocol: Yes   Plan:  Decrease to enoxaparin 30mg  Q 24hr  Watch renal function   Monitor for signs/symptoms of bleeding   Benetta Spar, PharmD, BCPS, BCCP Clinical Pharmacist  Please check AMION for all [place] phone numbers After 10:00 PM, call [place] [phone]

## 2020-10-27 ENCOUNTER — Inpatient Hospital Stay (HOSPITAL_COMMUNITY): Payer: PPO

## 2020-10-27 DIAGNOSIS — N179 Acute kidney failure, unspecified: Secondary | ICD-10-CM | POA: Diagnosis not present

## 2020-10-27 LAB — RESP PANEL BY RT-PCR (FLU A&B, COVID) ARPGX2
Influenza A by PCR: NEGATIVE
Influenza B by PCR: NEGATIVE
SARS Coronavirus 2 by RT PCR: NEGATIVE

## 2020-10-27 LAB — COMPREHENSIVE METABOLIC PANEL
ALT: 8 U/L (ref 0–44)
AST: 20 U/L (ref 15–41)
Albumin: 2.7 g/dL — ABNORMAL LOW (ref 3.5–5.0)
Alkaline Phosphatase: 49 U/L (ref 38–126)
Anion gap: 11 (ref 5–15)
BUN: 21 mg/dL (ref 8–23)
CO2: 28 mmol/L (ref 22–32)
Calcium: 9.1 mg/dL (ref 8.9–10.3)
Chloride: 97 mmol/L — ABNORMAL LOW (ref 98–111)
Creatinine, Ser: 1.23 mg/dL — ABNORMAL HIGH (ref 0.44–1.00)
GFR, Estimated: 44 mL/min — ABNORMAL LOW (ref >60)
Glucose, Bld: 95 mg/dL (ref 70–99)
Potassium: 4 mmol/L (ref 3.5–5.1)
Sodium: 136 mmol/L (ref 135–145)
Total Bilirubin: 0.7 mg/dL (ref 0.3–1.2)
Total Protein: 6.3 g/dL — ABNORMAL LOW (ref 6.5–8.1)

## 2020-10-27 LAB — CBC WITH DIFFERENTIAL/PLATELET
Abs Immature Granulocytes: 0.02 10*3/uL (ref 0.00–0.07)
Basophils Absolute: 0 10*3/uL (ref 0.0–0.1)
Basophils Relative: 0 %
Eosinophils Absolute: 0.1 10*3/uL (ref 0.0–0.5)
Eosinophils Relative: 2 %
HCT: 33.4 % — ABNORMAL LOW (ref 36.0–46.0)
Hemoglobin: 11.1 g/dL — ABNORMAL LOW (ref 12.0–15.0)
Immature Granulocytes: 0 %
Lymphocytes Relative: 29 %
Lymphs Abs: 2.1 10*3/uL (ref 0.7–4.0)
MCH: 33.6 pg (ref 26.0–34.0)
MCHC: 33.2 g/dL (ref 30.0–36.0)
MCV: 101.2 fL — ABNORMAL HIGH (ref 80.0–100.0)
Monocytes Absolute: 1 10*3/uL (ref 0.1–1.0)
Monocytes Relative: 14 %
Neutro Abs: 3.9 10*3/uL (ref 1.7–7.7)
Neutrophils Relative %: 55 %
Platelets: 196 10*3/uL (ref 150–400)
RBC: 3.3 MIL/uL — ABNORMAL LOW (ref 3.87–5.11)
RDW: 13.9 % (ref 11.5–15.5)
WBC: 7.2 10*3/uL (ref 4.0–10.5)
nRBC: 0 % (ref 0.0–0.2)

## 2020-10-27 LAB — RESPIRATORY PANEL BY PCR

## 2020-10-27 LAB — SEDIMENTATION RATE: Sed Rate: >140 mm/hr — ABNORMAL HIGH (ref 0–22)

## 2020-10-27 LAB — PROCALCITONIN: Procalcitonin: 1.26 ng/mL

## 2020-10-27 LAB — C-REACTIVE PROTEIN: CRP: 25.1 mg/dL — ABNORMAL HIGH (ref <1.0)

## 2020-10-27 NOTE — Progress Notes (Signed)
Pt c/o lower mid abd pain. Pure wick in place with no output. Las documented output was 253ml at approx 2pm on 4/19. Bladder scan was performed and noted that was in the bladder. Spoke with md on call and gave order to perform another scan and if the results are greater than 353ml in bladder to do in/out cath.

## 2020-10-27 NOTE — Progress Notes (Signed)
Bladder scan showed 431 ml of urine in the bladder as per order in/out cath performed using sterile technique did yield 640ml of clear amber urine. Cath was able to advance without resistance. Pt did tolerate procedure well.

## 2020-10-27 NOTE — Plan of Care (Signed)
  Problem: Activity: Goal: Ability to tolerate increased activity will improve Outcome: Progressing   Problem: Clinical Measurements: Goal: Ability to maintain a body temperature in the normal range will improve Outcome: Progressing   Problem: Respiratory: Goal: Ability to maintain adequate ventilation will improve Outcome: Progressing Goal: Ability to maintain a clear airway will improve Outcome: Progressing   

## 2020-10-27 NOTE — Care Management Important Message (Signed)
Important Message  Patient Details  Name: [name] [name] MRN: [id number] Date of Birth: [birth year]   Medicare Important Message Given:  Yes     Oleda Borski 10/27/2020, 9:05 AM

## 2020-10-27 NOTE — H&P (Signed)
PROGRESS NOTE    [name] [name]  NTZ:[id number] DOB: Jun 25, 1941 DOA: 10/23/2020 PCP: [name], MD   Brief Narrative:  [name] [name] is a 80 y.o. female with medical history significant for CKD 3B, congenital solitary kidney hypertension, hyperlipidemia and peripheral vascular disease who presented to the ED due to generalized weakness and shortness of breath.   she was admitted to this hospital in February due to pneumonia and was then discharged to a skilled nursing facility, she was discharged from the nursing facility about 1 month ago.  Patient complained of cough and shortness of breath when she lays flat and that she occasionally coughs up a blackish sputum.  Patient states that she has not been eating and drinking enough over the past few weeks and that her blood pressure usually  increase while shortness of breath worsens when she stands.  Patient complained of rashes on both legs which appears to be dried blister wounds and bilateral erythematous rash of similar pattern (chronic venous stasis dermatitis.  Patient also complained of mild epigastric pain which worsens when she eats.  She lives alone, but daughter lives about 3 houses from her and was always around to assist patient.  Recent hospitalization: 09/01/2020 to 09/10/2020 Sepsis with acute hypoxic respiratory failure due to pneumonia and altered mental status. She was treated with 5 days of antibiotics with MRSA coverage and was discharged to a SNF  In the emergency department, she was hemodynamically stable.  Work-up done in the ED showed elevated MCV (100.8), BUN/creatinine 16/1.52 (baseline creatinine at 0.9-1.3), D-dimer 1.04, troponin x2 was flat at 7, BNP 18.8, lactic acid 0.8 Chest x-ray showed resolution of previously seen right basilar pneumonia.  She was admitted to hospital service.  Due to elevated creatinine,CT angiogram of the chest was not done and VQ scan was ordered however patient could not complete that test either.   Doppler lower extremity ruled out DVT. Subsequently on 10/25/2020, patient complained of pleuritic chest pain.  She underwent CT angiogram of chest and PE was ruled out.  Assessment & Plan:   Active Problems:   Anxiety   Mixed hyperlipidemia   GERD (gastroesophageal reflux disease)   Depression   Dehydration   Stasis dermatitis of both legs   Generalized weakness   AKI (acute kidney injury) ([place])   Essential hypertension   Acute kidney injury superimposed on chronic kidney disease (HCC)   Shortness of breath   Elevated d-dimer   Failure to thrive in adult   Obesity (BMI 30.0-34.9)   Acute hypoxic respiratory failure: Elevated D-dimer.  She could not complete/lay flat for 20-minute for VQ scan to be completed.  Doppler lower extremity ruled out DVT so anticoagulation was stopped.  On 10/25/2020, she complained of pleuritic chest pain today and was having problem taking deep breaths.  CT angiogram of the chest was done and she was ruled out of PE.  Discontinued full dose anticoagulation.  CT chest ruled out pneumonia or pulmonary edema as well.  COVID-negative.  Checking respiratory panel.  Low-grade fever/occult infection: Patient has been having low-grade fever up to 200.5 and 100.7.  No clear source of infection.  Patient does not complain of any specific complaint.  She just feels generalized weak and body aches.  ESR and CRP significantly elevated and has elevated procalcitonin as well.  This indicates possible occult infection.  Blood culture at the time of admission has remained negative.  We will repeat blood culture today.  We will also order CT  abdomen and pelvis to look for primary source of infection.  If no source identified, will likely consult ID for help.  Generalized weakness in the setting of failure to thrive in adult: Patient has had poor and oral intake since returning from SNF.  PT OT on board.  They recommend SNF.  Initially declined by insurance but I was informed  today by [facility] that she may have a chance to go to SNF.  I will highly advocate for this patient to go to SNF as she is too weak to be sent home.  Dysuria/acute urinary retention: Patient was complaining of retention.  She was having some dribbling and some dysuria.  UA negative for acute infection.  She was straight cathed.  Had retention again and indwelling Foley catheter was placed.  Foley was removed on 10/26/2020.  Still has retention and required straight catheterization again.  UA completely negative for infection.  Hypomagnesemia: Resolved.  Hypokalemia: Resolved.  Acute kidney injury superimposed on CKD stage IIIa/dehydration: Baseline creatinine around 1.06 with GFR of 53.  Presented with creatinine of 1.52 with GFR of 34.  Likely due to dehydration.  Improved and seems to be back to her baseline.  Stasis dermatitis of both legs with multiple scabs.  Patient states that she developed all those during her recent hospitalization.  There is stable so continue wound care.  Essential hypertension: Controlled Continue amlodipine  Macrocytic anemia Continue vitamin B12  GERD Continue Protonix  Depression and anxiety Continue buspirone and Remeron  Obesity(BMI 30.61):  counseled on diet and nursing medication  DVT prophylaxis: enoxaparin (LOVENOX) injection 30 mg Start: 10/27/20 0800 SCDs Start: 10/23/20 1917   Code Status: DNR  Family Communication: Daughter present at bedside.  Plan of care discussed with patient and daughter in length.  Status is: Inpatient  Remains inpatient appropriate because:Ongoing diagnostic testing needed not appropriate for outpatient work up   Dispo: The patient is from: Home              Anticipated d/c is to: SNF              Patient currently is not medically stable to d/c.   Difficult to place patient No        Estimated body mass index is 30.61 kg/m as calculated from the following:   Height as of 09/01/20: 5' (1.524 m).   Weight  as of this encounter: 71.1 kg.      Nutritional status:               Consultants:   None  Procedures:   None  Antimicrobials:  Anti-infectives (From admission, onward)   None         Subjective: Patient seen and examined.  Daughter at the bedside.  Patient complains of just generalized weakness and feels worse than yesterday.  Did not come up with any specific complaint.  Denied any chest pain or shortness of breath.  Objective: Vitals:   10/26/20 1555 10/26/20 1756 10/26/20 1942 10/27/20 0800  BP: 123/76 102/60 99/60 (!) 110/54  Pulse: (!) 115 (!) 107 (!) 106 89  Resp: 16 16 18 16   Temp: (!) 100.5 F (38.1 C) 99.8 F (37.7 C) 99.2 F (37.3 C) 98.5 F (36.9 C)  TempSrc:   Axillary Axillary  SpO2: 91% 92% 92% 96%  Weight:        Intake/Output Summary (Last 24 hours) at 10/27/2020 1339 Last data filed at 10/27/2020 1100 Gross per 24 hour  Intake --  Output 1600 ml  Net -1600 ml   Filed Weights   10/23/20 2040  Weight: 71.1 kg    Examination:  General exam: Appears uncomfortable and in pain. Respiratory system: End expiratory wheezes bilaterally, no crackles or rhonchi. Respiratory effort normal. Cardiovascular system: S1 & S2 heard, RRR. No JVD, murmurs, rubs, gallops or clicks. No pedal edema. Gastrointestinal system: Abdomen is nondistended, soft and ?  Generalized tenderness. No organomegaly or masses felt. Normal bowel sounds heard. Central nervous system: Alert and oriented. No focal neurological deficits. Extremities: Symmetric 5 x 5 power. Skin: Stasis dermatitis along with the scabs. Psychiatry: Judgement and insight appear normal. Mood & affect appropriate.    Data Reviewed: I have personally reviewed following labs and imaging studies  CBC: Recent Labs  Lab 10/23/20 1426 10/24/20 0502 10/25/20 0353 10/26/20 0253 10/27/20 0408  WBC 9.3 6.1 7.7 6.9 7.2  NEUTROABS 6.3  --  4.9 4.1 3.9  HGB 12.9 12.1 11.7* 10.7* 11.1*  HCT  37.8 35.3* 34.9* 33.0* 33.4*  MCV 100.8* 100.9* 101.7* 102.2* 101.2*  PLT 198 169 180 176 518   Basic Metabolic Panel: Recent Labs  Lab 10/23/20 1426 10/24/20 0502 10/25/20 0353 10/26/20 0253 10/27/20 0408  NA 138 138 139 135 136  K 3.6 2.9* 4.2 4.2 4.0  CL 97* 99 102 99 97*  CO2 31 30 29 30 28   GLUCOSE 87 79 101* 97 95  BUN 16 11 16 15 21   CREATININE 1.52* 1.21* 1.23* 1.24* 1.23*  CALCIUM 9.1 9.0 9.4 9.2 9.1  MG  --  1.5* 2.0 1.8  --   PHOS  --  3.3  --   --   --    GFR: Estimated Creatinine Clearance: 31.5 mL/min (A) (by C-G formula based on SCr of 1.23 mg/dL (H)). Liver Function Tests: Recent Labs  Lab 10/24/20 0502 10/25/20 0353 10/26/20 0253 10/27/20 0408  AST 13* 12* 10* 20  ALT 7 8 7 8   ALKPHOS 63 56 53 49  BILITOT 1.5* 0.9 0.9 0.7  PROT 6.5 6.1* 5.8* 6.3*  ALBUMIN 3.4* 2.9* 2.6* 2.7*   No results for input(s): LIPASE, AMYLASE in the last 168 hours. No results for input(s): AMMONIA in the last 168 hours. Coagulation Profile: Recent Labs  Lab 10/24/20 0502  INR 1.1   Cardiac Enzymes: No results for input(s): CKTOTAL, CKMB, CKMBINDEX, TROPONINI in the last 168 hours. BNP (last 3 results) No results for input(s): PROBNP in the last 8760 hours. HbA1C: No results for input(s): HGBA1C in the last 72 hours. CBG: No results for input(s): GLUCAP in the last 168 hours. Lipid Profile: No results for input(s): CHOL, HDL, LDLCALC, TRIG, CHOLHDL, LDLDIRECT in the last 72 hours. Thyroid Function Tests: No results for input(s): TSH, T4TOTAL, FREET4, T3FREE, THYROIDAB in the last 72 hours. Anemia Panel: No results for input(s): VITAMINB12, FOLATE, FERRITIN, TIBC, IRON, RETICCTPCT in the last 72 hours. Sepsis Labs: Recent Labs  Lab 10/23/20 1426 10/23/20 1607 10/27/20 0833  PROCALCITON  --   --  1.26  LATICACIDVEN 1.1 0.8  --     Recent Results (from the past 240 hour(s))  Blood culture (routine x 2)     Status: None (Preliminary result)   Collection  Time: 10/23/20  2:10 PM   Specimen: BLOOD  Result Value Ref Range Status   Specimen Description BLOOD SITE NOT SPECIFIED  Final   Special Requests   Final    BOTTLES DRAWN AEROBIC AND ANAEROBIC Blood Culture adequate volume  Culture   Final    NO GROWTH 4 DAYS Performed at [facility], [place] [address] [place], [place] [address]    Report Status PENDING  Incomplete  Blood culture (routine x 2)     Status: None (Preliminary result)   Collection Time: 10/23/20  2:26 PM   Specimen: BLOOD  Result Value Ref Range Status   Specimen Description BLOOD SITE NOT SPECIFIED  Final   Special Requests   Final    BOTTLES DRAWN AEROBIC AND ANAEROBIC Blood Culture adequate volume   Culture   Final    NO GROWTH 4 DAYS Performed at [facility], [address] [place], [place] [address]    Report Status PENDING  Incomplete  Resp Panel by RT-PCR (Flu A&B, Covid) Nasopharyngeal Swab     Status: None   Collection Time: 10/27/20 10:54 AM   Specimen: Nasopharyngeal Swab; Nasopharyngeal(NP) swabs in vial transport medium  Result Value Ref Range Status   SARS Coronavirus 2 by RT PCR NEGATIVE NEGATIVE Final    Comment: (NOTE) SARS-CoV-2 target nucleic acids are NOT DETECTED.  The SARS-CoV-2 RNA is generally detectable in upper respiratory specimens during the acute phase of infection. The lowest concentration of SARS-CoV-2 viral copies this assay can detect is 138 copies/mL. A negative result does not preclude SARS-Cov-2 infection and should not be used as the sole basis for treatment or other patient management decisions. A negative result may occur with  improper specimen collection/handling, submission of specimen other than nasopharyngeal swab, presence of viral mutation(s) within the areas targeted by this assay, and inadequate number of viral copies(<138 copies/mL). A negative result must be combined with clinical observations, patient history, and epidemiological information. The  expected result is Negative.  Fact Sheet for Patients:  EntrepreneurPulse.com.au  Fact Sheet for Healthcare Providers:  IncredibleEmployment.be  This test is no t yet approved or cleared by the [place] FDA and  has been authorized for detection and/or diagnosis of SARS-CoV-2 by FDA under an Emergency Use Authorization (EUA). This EUA will remain  in effect (meaning this test can be used) for the duration of the COVID-19 declaration under Section 564(b)(1) of the Act, 21 U.S.C.section 360bbb-3(b)(1), unless the authorization is terminated  or revoked sooner.       Influenza A by PCR NEGATIVE NEGATIVE Final   Influenza B by PCR NEGATIVE NEGATIVE Final    Comment: (NOTE) The Xpert Xpress SARS-CoV-2/FLU/RSV plus assay is intended as an aid in the diagnosis of influenza from Nasopharyngeal swab specimens and should not be used as a sole basis for treatment. Nasal washings and aspirates are unacceptable for Xpert Xpress SARS-CoV-2/FLU/RSV testing.  Fact Sheet for Patients: EntrepreneurPulse.com.au  Fact Sheet for Healthcare Providers: IncredibleEmployment.be  This test is not yet approved or cleared by the [place] FDA and has been authorized for detection and/or diagnosis of SARS-CoV-2 by FDA under an Emergency Use Authorization (EUA). This EUA will remain in effect (meaning this test can be used) for the duration of the COVID-19 declaration under Section 564(b)(1) of the Act, 21 U.S.C. section 360bbb-3(b)(1), unless the authorization is terminated or revoked.  Performed at [facility], [place] [address], [place], [place] [address]       Radiology Studies: No results found.  Scheduled Meds: . amLODipine  5 mg Oral Daily  . aspirin  81 mg Oral Daily  . busPIRone  15 mg Oral TID  . Chlorhexidine Gluconate Cloth  6 each Topical Daily  .  cholecalciferol  1,000 Units Oral Daily  .  clopidogrel  75 mg Oral q morning  . divalproex  125 mg Oral BID  . dorzolamide  1 drop Both Eyes BID   And  . timolol  1 drop Both Eyes BID  . enoxaparin (LOVENOX) injection  30 mg Subcutaneous Q24H  . ferrous sulfate  325 mg Oral Daily  . furosemide  40 mg Intravenous Daily  . gabapentin  100 mg Oral TID  . latanoprost  1 drop Both Eyes QHS  . mirtazapine  15 mg Oral QHS  . pantoprazole  40 mg Oral Daily  . vitamin B-12  100 mcg Oral Daily   Continuous Infusions:   LOS: 4 days   Time spent: 33 minutes   Darliss Cheney, MD [facility]  10/27/2020, 1:39 PM   To contact the attending provider between 7A-7P or the covering provider during after hours 7P-7A, please log into the web site [email or web address].

## 2020-10-27 NOTE — TOC Progression Note (Signed)
Transition of Care [facility]) - Progression Note    Patient Details  Name: [name] [name] MRN: [id number] Date of Birth: 1941-02-01  [facility] CM/SW Contact  Joanne Chars, LCSW Phone Number: 10/27/2020, 1:59 PM  Clinical Narrative:     CSW spoke with pt grandson [name] [name] who reports that his mother talked to insurance company and was told SNF would be approved.  CSW spoke with Marlowe Kays at [facility], who reports there has not been a change in the denial of SNF auth, she encouraged CSW to speak with Dr Amalia Hailey at [facility] regarding the family situation.  CSW spoke with Dr Amalia Hailey [phone]), discussed family dynamics/conflict with daughter, current plan for pt to stay with Grandson.  Dr Amalia Hailey willing to consider SNF auth approval but if grandson is going to be primary support, suggests that SNF be in [place] closer to his location.  CSW spoke with pt regarding possible SNF placement in [place].  Pt reports her daughter came to see her this AM, they have talked, daughter is POA, and she would like CSW to speak with daughter about plan.  CSW spoke with daughter [name] [name], who confirms she is POA.  Discussed pt cognitive state based on slums score, how this would indicate need for pt to not live alone in the future.  [name] [name] reports that her mother has history of being untruthful, but stated she was not aware of the cognitive testing.  [name] [name] would support eventual plan of pt staying with [name] [name] but still believes she needs SNF prior to this.  CSW spoke with Dr Theadora Rama again with updates, he is asking that the treatment team discuss best plan based on this updated information and give recommendations to [facility] tomorrow.       Expected Discharge Plan: [place] Barriers to Discharge: Continued Medical Work up  Expected Discharge Plan and Services Expected Discharge Plan: [place]: [place] arrangements for  the past 2 months: Single Family Home                                       Social Determinants of Health (SDOH) Interventions    Readmission Risk Interventions No flowsheet data found.

## 2020-10-28 DIAGNOSIS — N189 Chronic kidney disease, unspecified: Secondary | ICD-10-CM | POA: Diagnosis not present

## 2020-10-28 DIAGNOSIS — R509 Fever, unspecified: Secondary | ICD-10-CM | POA: Diagnosis not present

## 2020-10-28 DIAGNOSIS — I872 Venous insufficiency (chronic) (peripheral): Secondary | ICD-10-CM

## 2020-10-28 DIAGNOSIS — B9562 Methicillin resistant Staphylococcus aureus infection as the cause of diseases classified elsewhere: Secondary | ICD-10-CM

## 2020-10-28 DIAGNOSIS — N179 Acute kidney failure, unspecified: Secondary | ICD-10-CM | POA: Diagnosis not present

## 2020-10-28 DIAGNOSIS — I129 Hypertensive chronic kidney disease with stage 1 through stage 4 chronic kidney disease, or unspecified chronic kidney disease: Secondary | ICD-10-CM

## 2020-10-28 DIAGNOSIS — R7881 Bacteremia: Secondary | ICD-10-CM

## 2020-10-28 DIAGNOSIS — Q600 Renal agenesis, unilateral: Secondary | ICD-10-CM

## 2020-10-28 DIAGNOSIS — N183 Chronic kidney disease, stage 3 unspecified: Secondary | ICD-10-CM

## 2020-10-28 LAB — CULTURE, BLOOD (ROUTINE X 2)
Culture: NO GROWTH
Culture: NO GROWTH
Special Requests: ADEQUATE
Special Requests: ADEQUATE

## 2020-10-28 LAB — BLOOD CULTURE ID PANEL (REFLEXED) - BCID2

## 2020-10-28 LAB — COMPREHENSIVE METABOLIC PANEL
ALT: 11 U/L (ref 0–44)
AST: 32 U/L (ref 15–41)
Albumin: 2.5 g/dL — ABNORMAL LOW (ref 3.5–5.0)
Alkaline Phosphatase: 51 U/L (ref 38–126)
Anion gap: 9 (ref 5–15)
BUN: 22 mg/dL (ref 8–23)
CO2: 31 mmol/L (ref 22–32)
Calcium: 8.8 mg/dL — ABNORMAL LOW (ref 8.9–10.3)
Chloride: 94 mmol/L — ABNORMAL LOW (ref 98–111)
Creatinine, Ser: 1.23 mg/dL — ABNORMAL HIGH (ref 0.44–1.00)
GFR, Estimated: 44 mL/min — ABNORMAL LOW (ref >60)
Glucose, Bld: 94 mg/dL (ref 70–99)
Potassium: 4.2 mmol/L (ref 3.5–5.1)
Sodium: 134 mmol/L — ABNORMAL LOW (ref 135–145)
Total Bilirubin: 0.5 mg/dL (ref 0.3–1.2)
Total Protein: 6 g/dL — ABNORMAL LOW (ref 6.5–8.1)

## 2020-10-28 LAB — C-REACTIVE PROTEIN: CRP: 19.4 mg/dL — ABNORMAL HIGH (ref <1.0)

## 2020-10-28 LAB — CBC WITH DIFFERENTIAL/PLATELET
Abs Immature Granulocytes: 0.02 10*3/uL (ref 0.00–0.07)
Basophils Absolute: 0 10*3/uL (ref 0.0–0.1)
Basophils Relative: 0 %
Eosinophils Absolute: 0.2 10*3/uL (ref 0.0–0.5)
Eosinophils Relative: 3 %
HCT: 31.1 % — ABNORMAL LOW (ref 36.0–46.0)
Hemoglobin: 10.4 g/dL — ABNORMAL LOW (ref 12.0–15.0)
Immature Granulocytes: 0 %
Lymphocytes Relative: 25 %
Lymphs Abs: 1.8 10*3/uL (ref 0.7–4.0)
MCH: 33.4 pg (ref 26.0–34.0)
MCHC: 33.4 g/dL (ref 30.0–36.0)
MCV: 100 fL (ref 80.0–100.0)
Monocytes Absolute: 0.8 10*3/uL (ref 0.1–1.0)
Monocytes Relative: 11 %
Neutro Abs: 4.1 10*3/uL (ref 1.7–7.7)
Neutrophils Relative %: 61 %
Platelets: 218 10*3/uL (ref 150–400)
RBC: 3.11 MIL/uL — ABNORMAL LOW (ref 3.87–5.11)
RDW: 13.8 % (ref 11.5–15.5)
WBC: 6.9 10*3/uL (ref 4.0–10.5)
nRBC: 0 % (ref 0.0–0.2)

## 2020-10-28 LAB — SEDIMENTATION RATE: Sed Rate: 128 mm/hr — ABNORMAL HIGH (ref 0–22)

## 2020-10-28 LAB — MAGNESIUM: Magnesium: 1.8 mg/dL (ref 1.7–2.4)

## 2020-10-28 MED ORDER — SALINE SPRAY 0.65 % NA SOLN
1.0000 | NASAL | Status: DC | PRN
  Administered 2020-10-28 – 2020-10-29 (×2): 1 via NASAL
  Filled 2020-10-28: qty 44

## 2020-10-28 MED ORDER — SODIUM CHLORIDE 0.9 % IV SOLN
500.0000 mg | Freq: Every day | INTRAVENOUS | Status: DC
  Administered 2020-10-28 – 2020-11-05 (×9): 500 mg via INTRAVENOUS
  Filled 2020-10-28 (×9): qty 10

## 2020-10-28 NOTE — Consult Note (Addendum)
[facility] for Infectious Disease  Total days of antibiotics 1/daptomycin         Reason for Consult: MRSA bacteremia    Referring Physician: pahwani  Active Problems:   Anxiety   Mixed hyperlipidemia   GERD (gastroesophageal reflux disease)   Depression   Dehydration   Stasis dermatitis of both legs   Generalized weakness   AKI (acute kidney injury) ([place])   Essential hypertension   Acute kidney injury superimposed on chronic kidney disease (HCC)   Shortness of breath   Elevated d-dimer   Failure to thrive in adult   Obesity (BMI 30.0-34.9)    HPI: [name] [name] is a 80 y.o. female with hx of RA, CKD3, HTN, CAD recovering from hospitalization in February for CAP, she reports progressive shortness of breath in the last 3 weeks. She has been having intermittent epistaxis and noticing red tinged sputum. She was admitted on 4/16, where she was evaluated for PE which was ruled out but then having intermittent fevers. Her initial blood cx on admit was NGTD on 4/16 but repeat blood cx taken due to fever on 4/20 now showing MRSA. She also noted having sores/pustules on her lower extremities over the past week. Denies chills/nightsweats/nor orthopnea, mainly fatigue is her predominant complaint. She states her joints ache as well, but on exam no warmth noted to any of her large joints. She is colonized with MRSA.  Past Medical History:  Diagnosis Date  . Anxiety   . Arthritis    RA  . Carpal tunnel syndrome, bilateral   . Chronic kidney disease    CKD stage III, absence of left kidney  . CKD (chronic kidney disease) stage 3, GFR 30-59 ml/min (HCC) 09/06/2019  . Congenital absence of one kidney    Pt has right kidney only  . Depression   . Dyslipidemia 09/06/2019  . Essential hypertension 09/06/2019  . GERD (gastroesophageal reflux disease)   . Headache   . Heart murmur   . Hypercholesteremia   . Hypertension   . Peripheral vascular disease ([place])    a. s/p R external iliac  stent '06 with angioplasty '13 (Dr. Benjamine Sprague) b. 01/2016: s/p PTA/stenting in L common iliac artery (Dr. Gwenlyn Found)  . Polymyalgia rheumatica ([place])   . Psoriasis   . Tremor 09/06/2019    Allergies:  Allergies  Allergen Reactions  . Statins   . Gabapentin Itching    Was deleted since patient was taking, but now experiencing itching again.   . Statins Swelling, Rash and Other (See Comments)    Swelling involving tongue; also causes muscle pain     MEDICATIONS: . amLODipine  5 mg Oral Daily  . aspirin  81 mg Oral Daily  . busPIRone  15 mg Oral TID  . Chlorhexidine Gluconate Cloth  6 each Topical Daily  . cholecalciferol  1,000 Units Oral Daily  . clopidogrel  75 mg Oral q morning  . divalproex  125 mg Oral BID  . dorzolamide  1 drop Both Eyes BID   And  . timolol  1 drop Both Eyes BID  . enoxaparin (LOVENOX) injection  30 mg Subcutaneous Q24H  . ferrous sulfate  325 mg Oral Daily  . furosemide  40 mg Intravenous Daily  . gabapentin  100 mg Oral TID  . latanoprost  1 drop Both Eyes QHS  . mirtazapine  15 mg Oral QHS  . pantoprazole  40 mg Oral Daily  . vitamin B-12  100 mcg Oral  Daily    Social History   Tobacco Use  . Smoking status: Former Smoker    Packs/day: 1.00    Years: 40.00    Pack years: 40.00    Types: Cigarettes    Quit date: 07/24/2010    Years since quitting: 10.2  . Smokeless tobacco: Never Used  . Tobacco comment: STARTED BACK JULY 2013 AND RECENTLY QUIT 03-10-2012  Vaping Use  . Vaping Use: Never used  Substance Use Topics  . Alcohol use: Never    Comment: QUIT IN 1999  . Drug use: Never    Family History  Problem Relation Age of Onset  . Angina Mother   . Heart attack Father   . Melanoma Brother   . Cancer Sister        Lymphoma     Review of Systems  Constitutional: positive for fever, chills, diaphoresis, activity change, appetite change, fatigue and unexpected weight change.  HENT: Negative for congestion, sore throat, rhinorrhea, sneezing,  trouble swallowing and sinus pressure.  Eyes: Negative for photophobia and visual disturbance.  Respiratory: Positive shortness of breath, Negative for cough, chest tightness,  wheezing and stridor.  Cardiovascular: Negative for chest pain, palpitations and leg swelling.  Gastrointestinal: Negative for nausea, vomiting, abdominal pain, diarrhea, constipation, blood in stool, abdominal distention and anal bleeding.  Genitourinary: Negative for dysuria, hematuria, flank pain and difficulty urinating.  Musculoskeletal: Negative for myalgias, back pain, joint swelling, arthralgias and gait problem.  Skin: positive lower extremity wounds, Negative for color change, pallor,  and wound.  Neurological: Negative for dizziness, tremors, weakness and light-headedness.  Hematological: Negative for adenopathy. Does not bruise/bleed easily.  Psychiatric/Behavioral: Negative for behavioral problems, confusion, sleep disturbance, dysphoric mood, decreased concentration and agitation.     OBJECTIVE: Temp:  [97 F (36.1 C)-98.7 F (37.1 C)] 97 F (36.1 C) (04/21 0800) Pulse Rate:  [92-99] 99 (04/21 0800) Resp:  [16-24] 24 (04/21 0800) BP: (100-140)/(65-72) 140/72 (04/21 0800) SpO2:  [97 %-99 %] 97 % (04/21 0800) Physical Exam  Constitutional:  oriented to person, place, and time. appears well-developed and well-nourished. No distress.  HENT: [place]/AT, PERRLA, no scleral icterus Mouth/Throat: Oropharynx is clear and moist. No oropharyngeal exudate.  Cardiovascular: Normal rate, regular rhythm and normal heart sounds. Exam reveals no gallop and no friction rub.  No murmur heard.  Pulmonary/Chest: [place] normal and breath sounds normal. No respiratory distress.  has no wheezes.  Neck = supple, no nuchal rigidity Abdominal: Soft. Bowel sounds are normal.  exhibits no distension. There is no tenderness.  Lymphadenopathy: no cervical adenopathy. No axillary adenopathy Neurological: alert and oriented to  person, place, and time.  Skin: Skin is warm and dry but has scattered eschar, 2 lesions appear to be healing pustules.  Psychiatric: a normal mood and affect.  behavior is normal.   LABS: Results for orders placed or performed during the hospital encounter of 10/23/20 (from the past 48 hour(s))  CBC with Differential/Platelet     Status: Abnormal   Collection Time: 10/27/20  4:08 AM  Result Value Ref [place]   WBC 7.2 4.0 - 10.5 K/uL   RBC 3.30 (L) 3.87 - 5.11 MIL/uL   Hemoglobin 11.1 (L) 12.0 - 15.0 g/dL   HCT 33.4 (L) 36.0 - 46.0 %   MCV 101.2 (H) 80.0 - 100.0 fL   MCH 33.6 26.0 - 34.0 pg   MCHC 33.2 30.0 - 36.0 g/dL   RDW 13.9 11.5 - 15.5 %   Platelets 196 150 - 400  K/uL   nRBC 0.0 0.0 - 0.2 %   Neutrophils Relative % 55 %   Neutro Abs 3.9 1.7 - 7.7 K/uL   Lymphocytes Relative 29 %   Lymphs Abs 2.1 0.7 - 4.0 K/uL   Monocytes Relative 14 %   Monocytes Absolute 1.0 0.1 - 1.0 K/uL   Eosinophils Relative 2 %   Eosinophils Absolute 0.1 0.0 - 0.5 K/uL   Basophils Relative 0 %   Basophils Absolute 0.0 0.0 - 0.1 K/uL   Immature Granulocytes 0 %   Abs Immature Granulocytes 0.02 0.00 - 0.07 K/uL    Comment: Performed at [facility] [address], [place], [place] [address]  Comprehensive metabolic panel     Status: Abnormal   Collection Time: 10/27/20  4:08 AM  Result Value Ref Range   Sodium 136 135 - 145 mmol/L   Potassium 4.0 3.5 - 5.1 mmol/L   Chloride 97 (L) 98 - 111 mmol/L   CO2 28 22 - 32 mmol/L   Glucose, Bld 95 70 - 99 mg/dL    Comment: Glucose reference range applies only to samples taken after fasting for at least 8 hours.   BUN 21 8 - 23 mg/dL   Creatinine, Ser 1.23 (H) 0.44 - 1.00 mg/dL   Calcium 9.1 8.9 - 10.3 mg/dL   Total Protein 6.3 (L) 6.5 - 8.1 g/dL   Albumin 2.7 (L) 3.5 - 5.0 g/dL   AST 20 15 - 41 U/L   ALT 8 0 - 44 U/L   Alkaline Phosphatase 49 38 - 126 U/L   Total Bilirubin 0.7 0.3 - 1.2 mg/dL   GFR, Estimated 44 (L) >60 mL/min    Comment:  (NOTE) Calculated using the CKD-EPI Creatinine Equation (2021)    Anion gap 11 5 - 15    Comment: Performed at [facility], [place] [address], [place], [place] [address]  Procalcitonin - Baseline     Status: None   Collection Time: 10/27/20  8:33 AM  Result Value Ref Range   Procalcitonin 1.26 ng/mL    Comment:        Interpretation: PCT > 0.5 ng/mL and <= 2 ng/mL: Systemic infection (sepsis) is possible, but other conditions are known to elevate PCT as well. (NOTE)       Sepsis PCT Algorithm           Lower Respiratory Tract                                      Infection PCT Algorithm    ----------------------------     ----------------------------         PCT < 0.25 ng/mL                PCT < 0.10 ng/mL          Strongly encourage             Strongly discourage   discontinuation of antibiotics    initiation of antibiotics    ----------------------------     -----------------------------       PCT 0.25 - 0.50 ng/mL            PCT 0.10 - 0.25 ng/mL               OR       >80% decrease in PCT  Discourage initiation of                                            antibiotics      Encourage discontinuation           of antibiotics    ----------------------------     -----------------------------         PCT >= 0.50 ng/mL              PCT 0.26 - 0.50 ng/mL                AND       <80% decrease in PCT             Encourage initiation of                                             antibiotics       Encourage continuation           of antibiotics    ----------------------------     -----------------------------        PCT >= 0.50 ng/mL                  PCT > 0.50 ng/mL               AND         increase in PCT                  Strongly encourage                                      initiation of antibiotics    Strongly encourage escalation           of antibiotics                                     -----------------------------                                            PCT <= 0.25 ng/mL                                                 OR                                        > 80% decrease in PCT                                      Discontinue / Do not initiate  antibiotics  Performed at [facility], [place] [address] [place], [place] [address]   Sedimentation rate     Status: Abnormal   Collection Time: 10/27/20  8:33 AM  Result Value Ref Range   Sed Rate >140 (H) 0 - 22 mm/hr    Comment: Performed at [facility] [address], [place], [place] [address]  C-reactive protein     Status: Abnormal   Collection Time: 10/27/20  8:33 AM  Result Value Ref Range   CRP 25.1 (H) <1.0 mg/dL    Comment: Performed at [facility] [address], [place], [place] [address]  Resp Panel by RT-PCR (Flu A&B, Covid) Nasopharyngeal Swab     Status: None   Collection Time: 10/27/20 10:54 AM   Specimen: Nasopharyngeal Swab; Nasopharyngeal(NP) swabs in vial transport medium  Result Value Ref Range   SARS Coronavirus 2 by RT PCR NEGATIVE NEGATIVE    Comment: (NOTE) SARS-CoV-2 target nucleic acids are NOT DETECTED.  The SARS-CoV-2 RNA is generally detectable in upper respiratory specimens during the acute phase of infection. The lowest concentration of SARS-CoV-2 viral copies this assay can detect is 138 copies/mL. A negative result does not preclude SARS-Cov-2 infection and should not be used as the sole basis for treatment or other patient management decisions. A negative result may occur with  improper specimen collection/handling, submission of specimen other than nasopharyngeal swab, presence of viral mutation(s) within the areas targeted by this assay, and inadequate number of viral copies(<138 copies/mL). A negative result must be combined with clinical observations, patient history, and epidemiological information. The expected result is Negative.  Fact Sheet for Patients:   EntrepreneurPulse.com.au  Fact Sheet for Healthcare Providers:  IncredibleEmployment.be  This test is no t yet approved or cleared by the [place] FDA and  has been authorized for detection and/or diagnosis of SARS-CoV-2 by FDA under an Emergency Use Authorization (EUA). This EUA will remain  in effect (meaning this test can be used) for the duration of the COVID-19 declaration under Section 564(b)(1) of the Act, 21 U.S.C.section 360bbb-3(b)(1), unless the authorization is terminated  or revoked sooner.       Influenza A by PCR NEGATIVE NEGATIVE   Influenza B by PCR NEGATIVE NEGATIVE    Comment: (NOTE) The Xpert Xpress SARS-CoV-2/FLU/RSV plus assay is intended as an aid in the diagnosis of influenza from Nasopharyngeal swab specimens and should not be used as a sole basis for treatment. Nasal washings and aspirates are unacceptable for Xpert Xpress SARS-CoV-2/FLU/RSV testing.  Fact Sheet for Patients: EntrepreneurPulse.com.au  Fact Sheet for Healthcare Providers: IncredibleEmployment.be  This test is not yet approved or cleared by the [place] FDA and has been authorized for detection and/or diagnosis of SARS-CoV-2 by FDA under an Emergency Use Authorization (EUA). This EUA will remain in effect (meaning this test can be used) for the duration of the COVID-19 declaration under Section 564(b)(1) of the Act, 21 U.S.C. section 360bbb-3(b)(1), unless the authorization is terminated or revoked.  Performed at [facility], [place] [address], [place], [place] [address]   Culture, blood (routine x 2)     Status: None (Preliminary result)   Collection Time: 10/27/20  2:00 PM   Specimen: BLOOD  Result Value Ref Range   Specimen Description BLOOD LEFT ANTECUBITAL    Special Requests      BOTTLES DRAWN AEROBIC AND ANAEROBIC Blood Culture adequate volume   Culture      NO GROWTH <  24  HOURS Performed at [facility], [place] [address] [place], [place] [address]    Report Status PENDING   Culture, blood (routine x 2)     Status: None (Preliminary result)   Collection Time: 10/27/20  2:05 PM   Specimen: BLOOD  Result Value Ref Range   Specimen Description BLOOD RIGHT ANTECUBITAL    Special Requests      BOTTLES DRAWN AEROBIC AND ANAEROBIC Blood Culture adequate volume   Culture  Setup Time      GRAM POSITIVE COCCI IN CLUSTERS AEROBIC BOTTLE ONLY CRITICAL RESULT CALLED TO, READ BACK BY AND VERIFIED WITH: PHARMD JEREMY FRENN AT 4680 ON 10/28/20 BY KJ Performed at [facility], [place] [address] [place], [place] [address]    Culture GRAM POSITIVE COCCI    Report Status PENDING   Blood Culture ID Panel (Reflexed)     Status: Abnormal   Collection Time: 10/27/20  2:05 PM  Result Value Ref Range   Enterococcus faecalis NOT DETECTED NOT DETECTED   Enterococcus Faecium NOT DETECTED NOT DETECTED   Listeria monocytogenes NOT DETECTED NOT DETECTED   Staphylococcus species DETECTED (A) NOT DETECTED    Comment: CRITICAL RESULT CALLED TO, READ BACK BY AND VERIFIED WITH: [place] ON 10/28/20    Staphylococcus aureus (BCID) DETECTED (A) NOT DETECTED    Comment: Methicillin (oxacillin)-resistant Staphylococcus aureus (MRSA). MRSA is predictably resistant to beta-lactam antibiotics (except ceftaroline). Preferred therapy is vancomycin unless clinically contraindicated. Patient requires contact precautions if  hospitalized. CRITICAL RESULT CALLED TO, READ BACK BY AND VERIFIED WITH: [place] AT 0919 ON 10/28/20 BY KJ    Staphylococcus epidermidis NOT DETECTED NOT DETECTED   Staphylococcus lugdunensis NOT DETECTED NOT DETECTED   Streptococcus species NOT DETECTED NOT DETECTED   Streptococcus agalactiae NOT DETECTED NOT DETECTED   Streptococcus pneumoniae NOT DETECTED NOT DETECTED   Streptococcus pyogenes NOT DETECTED NOT DETECTED    A.calcoaceticus-baumannii NOT DETECTED NOT DETECTED   Bacteroides fragilis NOT DETECTED NOT DETECTED   Enterobacterales NOT DETECTED NOT DETECTED   Enterobacter cloacae complex NOT DETECTED NOT DETECTED   Escherichia coli NOT DETECTED NOT DETECTED   Klebsiella aerogenes NOT DETECTED NOT DETECTED   Klebsiella oxytoca NOT DETECTED NOT DETECTED   Klebsiella pneumoniae NOT DETECTED NOT DETECTED   Proteus species NOT DETECTED NOT DETECTED   Salmonella species NOT DETECTED NOT DETECTED   Serratia marcescens NOT DETECTED NOT DETECTED   Haemophilus influenzae NOT DETECTED NOT DETECTED   Neisseria meningitidis NOT DETECTED NOT DETECTED   Pseudomonas aeruginosa NOT DETECTED NOT DETECTED   Stenotrophomonas maltophilia NOT DETECTED NOT DETECTED   Candida albicans NOT DETECTED NOT DETECTED   Candida auris NOT DETECTED NOT DETECTED   Candida glabrata NOT DETECTED NOT DETECTED   Candida krusei NOT DETECTED NOT DETECTED   Candida parapsilosis NOT DETECTED NOT DETECTED   Candida tropicalis NOT DETECTED NOT DETECTED   Cryptococcus neoformans/gattii NOT DETECTED NOT DETECTED   Meth resistant mecA/C and MREJ DETECTED (A) NOT DETECTED    Comment: CRITICAL RESULT CALLED TO, READ BACK BY AND VERIFIED WITH: PHARMD JEREMY FRENN AT 4825 ON 10/28/20 BY KJ Performed at [facility], [address] [place], [place] [address]   Respiratory (~20 pathogens) panel by PCR     Status: None   Collection Time: 10/27/20  5:00 PM   Specimen: Nasopharyngeal Swab; Respiratory  Result Value Ref Range   Adenovirus NOT DETECTED NOT DETECTED  Coronavirus 229E NOT DETECTED NOT DETECTED    Comment: (NOTE) The Coronavirus on the Respiratory Panel, DOES NOT test for the novel  Coronavirus (2019 nCoV)    Coronavirus HKU1 NOT DETECTED NOT DETECTED   Coronavirus NL63 NOT DETECTED NOT DETECTED   Coronavirus OC43 NOT DETECTED NOT DETECTED   Metapneumovirus NOT DETECTED NOT DETECTED   Rhinovirus / Enterovirus NOT DETECTED  NOT DETECTED   Influenza A NOT DETECTED NOT DETECTED   Influenza B NOT DETECTED NOT DETECTED   Parainfluenza Virus 1 NOT DETECTED NOT DETECTED   Parainfluenza Virus 2 NOT DETECTED NOT DETECTED   Parainfluenza Virus 3 NOT DETECTED NOT DETECTED   Parainfluenza Virus 4 NOT DETECTED NOT DETECTED   Respiratory Syncytial Virus NOT DETECTED NOT DETECTED   Bordetella pertussis NOT DETECTED NOT DETECTED   Bordetella Parapertussis NOT DETECTED NOT DETECTED   Chlamydophila pneumoniae NOT DETECTED NOT DETECTED   Mycoplasma pneumoniae NOT DETECTED NOT DETECTED    Comment: Performed at [facility], [place] [address], [place], [place] [address]  CBC with Differential/Platelet     Status: Abnormal   Collection Time: 10/28/20  1:39 AM  Result Value Ref Range   WBC 6.9 4.0 - 10.5 K/uL   RBC 3.11 (L) 3.87 - 5.11 MIL/uL   Hemoglobin 10.4 (L) 12.0 - 15.0 g/dL   HCT 31.1 (L) 36.0 - 46.0 %   MCV 100.0 80.0 - 100.0 fL   MCH 33.4 26.0 - 34.0 pg   MCHC 33.4 30.0 - 36.0 g/dL   RDW 13.8 11.5 - 15.5 %   Platelets 218 150 - 400 K/uL   nRBC 0.0 0.0 - 0.2 %   Neutrophils Relative % 61 %   Neutro Abs 4.1 1.7 - 7.7 K/uL   Lymphocytes Relative 25 %   Lymphs Abs 1.8 0.7 - 4.0 K/uL   Monocytes Relative 11 %   Monocytes Absolute 0.8 0.1 - 1.0 K/uL   Eosinophils Relative 3 %   Eosinophils Absolute 0.2 0.0 - 0.5 K/uL   Basophils Relative 0 %   Basophils Absolute 0.0 0.0 - 0.1 K/uL   Immature Granulocytes 0 %   Abs Immature Granulocytes 0.02 0.00 - 0.07 K/uL    Comment: Performed at [facility] [address], [place], [place] [address]  Comprehensive metabolic panel     Status: Abnormal   Collection Time: 10/28/20  1:39 AM  Result Value Ref Range   Sodium 134 (L) 135 - 145 mmol/L   Potassium 4.2 3.5 - 5.1 mmol/L   Chloride 94 (L) 98 - 111 mmol/L   CO2 31 22 - 32 mmol/L   Glucose, Bld 94 70 - 99 mg/dL    Comment: Glucose reference range applies only to samples taken after fasting for at least 8  hours.   BUN 22 8 - 23 mg/dL   Creatinine, Ser 1.23 (H) 0.44 - 1.00 mg/dL   Calcium 8.8 (L) 8.9 - 10.3 mg/dL   Total Protein 6.0 (L) 6.5 - 8.1 g/dL   Albumin 2.5 (L) 3.5 - 5.0 g/dL   AST 32 15 - 41 U/L   ALT 11 0 - 44 U/L   Alkaline Phosphatase 51 38 - 126 U/L   Total Bilirubin 0.5 0.3 - 1.2 mg/dL   GFR, Estimated 44 (L) >60 mL/min    Comment: (NOTE) Calculated using the CKD-EPI Creatinine Equation (2021)    Anion gap 9 5 - 15    Comment: Performed at [facility], [address] [place], [place] [address]  Magnesium     Status: None   Collection Time: 10/28/20  1:39 AM  Result Value Ref Range   Magnesium 1.8 1.7 - 2.4 mg/dL    Comment: Performed at [facility], [place] [address], [place], [place] [address]  Sedimentation rate     Status: Abnormal   Collection Time: 10/28/20  6:53 AM  Result Value Ref Range   Sed Rate 128 (H) 0 - 22 mm/hr    Comment: Performed at [facility] [address], [place], [place] [address]  C-reactive protein     Status: Abnormal   Collection Time: 10/28/20  6:53 AM  Result Value Ref Range   CRP 19.4 (H) <1.0 mg/dL    Comment: Performed at [facility] [address] [place], [place] [address]    MICRO: 4/16 blood cx ngtd 4/20 blood cx MRSA IMAGING: CT ABDOMEN PELVIS WO CONTRAST  Result Date: 10/27/2020 CLINICAL DATA:  80 year old female with fever of unknown origin. EXAM: CT ABDOMEN AND PELVIS WITHOUT CONTRAST TECHNIQUE: Multidetector CT imaging of the abdomen and pelvis was performed following the standard protocol without IV contrast. COMPARISON:  CT abdomen pelvis dated 10/16/2014. FINDINGS: Evaluation of this exam is limited in the absence of intravenous contrast. Lower chest: There are bibasilar linear atelectasis/scarring. There is coronary vascular calcification. No intra-abdominal free air or free fluid. Hepatobiliary: The liver is unremarkable. No intrahepatic biliary ductal dilatation. There is layering sludge or  small stones within the gallbladder. No pericholecystic fluid or evidence of acute cholecystitis by CT. Pancreas: Unremarkable. No pancreatic ductal dilatation or surrounding inflammatory changes. Spleen: Normal in size without focal abnormality. Adrenals/Urinary Tract: The adrenal glands are unremarkable. Atrophic left kidney. There is no hydronephrosis or nephrolithiasis on the right. The visualized ureters and urinary bladder appear unremarkable. Stomach/Bowel: There is moderate stool throughout the colon. Small scattered colonic diverticula without active inflammatory changes. There is no bowel obstruction or active inflammation. Appendectomy. Vascular/Lymphatic: Advanced aortoiliac atherosclerotic disease. The IVC is unremarkable. No portal venous gas. There is no adenopathy. Reproductive: Hysterectomy. No adnexal masses. Other: Postsurgical changes of anterior pelvic wall, likely related to prior hernia repair. There is a small fat containing umbilical hernia. No bowel herniation, fluid collection, or inflammatory changes. Focal area of induration of the subcutaneous soft tissues of the left anterior pelvic wall, likely related to subcutaneous injection. Musculoskeletal: Degenerative changes of the spine. No acute osseous pathology. IMPRESSION: 1. No acute intra-abdominal or pelvic pathology. 2. Moderate colonic stool burden. No bowel obstruction. 3. Aortic Atherosclerosis (ICD10-I70.0). Electronically Signed   By: Anner Crete M.D.   On: 10/27/2020 20:19   Assessment/Plan:  80yo F with hospital onset MRSA bacteremia ( occurring on day 4 of hospitalization) but has had fevers since admit. Source of infection possibly disseminated ( such as endocarditis) vs lower extremity skin lesions vs. Epistaxis as portal of entry of bacteria into her bloodstream.  MRSA bacteremia - recommend to start on daptomycin - will repeat blood cx tomorrow to see if she is clearing - start with TTE to evaluated for  endocarditis, but may need TEE  aki = choosing nephro-friendly antibacterial agent to treat MRSA bacteremia.  - will get baseline CK  Stasis dermatitis = consider wound care consult to help also manage eschar lesions  Health maintenance = will need to check if she has received 2nd booster of covid vaccine.

## 2020-10-28 NOTE — H&P (Addendum)
PROGRESS NOTE    [name] [name]  TMH:[id number] DOB: 01/20/1941 DOA: 10/23/2020 PCP: [name], MD   Brief Narrative:  [name] [name] is a 80 y.o. female with medical history significant for CKD 3B, congenital solitary kidney hypertension, hyperlipidemia and peripheral vascular disease who presented to the ED due to generalized weakness and shortness of breath.   she was admitted to this hospital in February due to pneumonia and was then discharged to a skilled nursing facility, she was discharged from the nursing facility about 1 month ago.  Patient complained of cough and shortness of breath when she lays flat and that she occasionally coughs up a blackish sputum.  Patient states that she has not been eating and drinking enough over the past few weeks and that her blood pressure usually  increase while shortness of breath worsens when she stands.  Patient complained of rashes on both legs which appears to be dried blister wounds and bilateral erythematous rash of similar pattern (chronic venous stasis dermatitis.  Patient also complained of mild epigastric pain which worsens when she eats.  She lives alone, but daughter lives about 3 houses from her and was always around to assist patient.  Recent hospitalization: 09/01/2020 to 09/10/2020 Sepsis with acute hypoxic respiratory failure due to pneumonia and altered mental status. She was treated with 5 days of antibiotics with MRSA coverage and was discharged to a SNF  In the emergency department, she was hemodynamically stable.  Work-up done in the ED showed elevated MCV (100.8), BUN/creatinine 16/1.52 (baseline creatinine at 0.9-1.3), D-dimer 1.04, troponin x2 was flat at 7, BNP 18.8, lactic acid 0.8 Chest x-ray showed resolution of previously seen right basilar pneumonia.  She was admitted to hospital service.  Due to elevated creatinine,CT angiogram of the chest was not done and VQ scan was ordered however patient could not complete that test either.   Doppler lower extremity ruled out DVT. Subsequently on 10/25/2020, patient complained of pleuritic chest pain.  She underwent CT angiogram of chest and PE was ruled out.  Assessment & Plan:   Active Problems:   Anxiety   Mixed hyperlipidemia   GERD (gastroesophageal reflux disease)   Depression   Dehydration   Stasis dermatitis of both legs   Generalized weakness   AKI (acute kidney injury) ([place])   Essential hypertension   Acute kidney injury superimposed on chronic kidney disease (HCC)   Shortness of breath   Elevated d-dimer   Failure to thrive in adult   Obesity (BMI 30.0-34.9)   Acute hypoxic respiratory failure: Elevated D-dimer.  She could not complete/lay flat for 20-minute for VQ scan to be completed.  Doppler lower extremity ruled out DVT so anticoagulation was stopped.  On 10/25/2020, she complained of pleuritic chest pain today and was having problem taking deep breaths.  CT angiogram of the chest was done and she was ruled out of PE.  Discontinued full dose anticoagulation.  CT chest ruled out pneumonia or pulmonary edema as well.  Tested negative for COVID or respiratory viral panel.  She has no shortness of breath now and she is on room air and comfortable.  Low-grade fever/occult infection/MRSA bacteremia: Patient has been having low-grade fever up to 200.5 and 100.7.  No clear source of infection.  Patient does not complain of any specific complaint.  She just feels generalized weak and body aches.  ESR and CRP significantly elevated and has elevated procalcitonin as well.  This indicates possible occult infection.  Blood culture at  the time of admission has remained negative.  CT abdomen and pelvis negative for acute pathology either.  She has not had any fever last 24 hours now.  One of the 2 sets from repeat blood culture from 10/27/2020 is growing MRSA.  ID now auto consulted.  Patient started on daptomycin.  She has scabs in bilateral lower extremities and stasis  dermatitis.  Does not have any signs of acute cellulitis or erythema.  However after talking to her daughter, I learned that patient has tendency to pick her scabs.  This could very well be the source of bacteremia.  Epistaxis: Patient did spit some blood this morning.  On examination, her nostrils are erythematous.  She clearly said that she had swallowed her own blood from the nose.  No obvious source of bleeding from the mouth.  We will hold Lovenox today.  Generalized weakness in the setting of failure to thrive in adult: Patient has had poor and oral intake since returning from SNF.  PT OT on board.  They recommend SNF.  Initially declined by insurance but I was informed today by [facility] that she may have a chance to go to SNF.  I will highly advocate for this patient to go to SNF as she is too weak to be sent home.  PMR: Patient's chart has a diagnosis documentation of PMR.  When discussed with the patient, she is not aware of the diagnosis.  She has not heard of that.  Also when I spoke to her daughter today, she is not aware of that either.  Patient's ESR and CRP being significantly elevated could very well be due to this.  She denied any headache and she does not have any tenderness on the temporal area ruling out giant cell arteritis.  I think her generalized body aches and pains are likely secondary to this.  I would like to start her on low-dose prednisone for that.  Waiting for ID to see her first.  Will discuss with ID afterwards.  Hypomagnesemia: Resolved.  Hypokalemia: Resolved.  Urinary retention: Resolved.  Acute kidney injury superimposed on CKD stage IIIa/dehydration: Baseline creatinine around 1.06 with GFR of 53.  Presented with creatinine of 1.52 with GFR of 34.  Likely due to dehydration.  Improved and seems to be back to her baseline.  Stasis dermatitis of both legs with multiple scabs.  Patient states that she developed all those during her recent hospitalization.  There is  stable so continue wound care.  Essential hypertension: Controlled Continue amlodipine  Macrocytic anemia Continue vitamin B12  GERD Continue Protonix  Depression and anxiety Continue buspirone and Remeron  Obesity(BMI 30.61):  counseled on diet and nursing medication  DVT prophylaxis: enoxaparin (LOVENOX) injection 30 mg Start: 10/27/20 0800 SCDs Start: 10/23/20 1917   Code Status: DNR  Family Communication: Daughter present at bedside.  Plan of care discussed with patient and daughter over the phone in length.  Status is: Inpatient  Remains inpatient appropriate because:Ongoing diagnostic testing needed not appropriate for outpatient work up   Dispo: The patient is from: Home              Anticipated d/c is to: SNF              Patient currently is not medically stable to d/c.   Difficult to place patient No        Estimated body mass index is 30.61 kg/m as calculated from the following:   Height as of 09/01/20: 5' (  1.524 m).   Weight as of this encounter: 71.1 kg.      Nutritional status:               Consultants:   None  Procedures:   None  Antimicrobials:  Anti-infectives (From admission, onward)   Start     Dose/Rate Route Frequency Ordered Stop   10/28/20 1030  DAPTOmycin (CUBICIN) 500 mg in sodium chloride 0.9 % IVPB        500 mg 120 mL/hr over 30 Minutes Intravenous Daily 10/28/20 0930           Subjective: Patient seen and examined.  Daughter at the bedside.  Patient complains of just generalized weakness and feels worse than yesterday.  Did not come up with any specific complaint.  Denied any chest pain or shortness of breath.  Objective: Vitals:   10/27/20 0800 10/27/20 1619 10/27/20 2233 10/28/20 0800  BP: (!) 110/54 103/67 100/65 140/72  Pulse: 89 97 92 99  Resp: _0 (!) 24  Temp: 98.5 F (36.9 C) 98.3 F (36.8 C) 98.7 F (37.1 C) (!) 97 F (36.1 C)  TempSrc: Axillary  Oral   SpO2: 96% 99% 99% 97%   Weight:        Intake/Output Summary (Last 24 hours) at 10/28/2020 1118 Last data filed at 10/28/2020 0800 Gross per 24 hour  Intake --  Output 2250 ml  Net -2250 ml   Filed Weights   10/23/20 2040  Weight: 71.1 kg    Examination: General exam: Appears calm and comfortable  Respiratory system: Diminished breath sounds bilaterally with very faint end expiratory wheezes. Respiratory effort normal. Cardiovascular system: S1 & S2 heard, RRR. No JVD, murmurs, rubs, gallops or clicks. No pedal edema. Gastrointestinal system: Abdomen is nondistended, soft and nontender. No organomegaly or masses felt. Normal bowel sounds heard. Central nervous system: Alert and oriented. No focal neurological deficits. Extremities: Symmetric 5 x 5 power. Skin: Multiple scabs bilateral lower extremities with stasis dermatitis. Psychiatry: Judgement and insight appear normal. Mood & affect appropriate.    Data Reviewed: I have personally reviewed following labs and imaging studies  CBC: Recent Labs  Lab 10/23/20 1426 10/24/20 0502 10/25/20 0353 10/26/20 0253 10/27/20 0408 10/28/20 0139  WBC 9.3 6.1 7.7 6.9 7.2 6.9  NEUTROABS 6.3  --  4.9 4.1 3.9 4.1  HGB 12.9 12.1 11.7* 10.7* 11.1* 10.4*  HCT 37.8 35.3* 34.9* 33.0* 33.4* 31.1*  MCV 100.8* 100.9* 101.7* 102.2* 101.2* 100.0  PLT 198 169 180 176 196 097   Basic Metabolic Panel: Recent Labs  Lab 10/24/20 0502 10/25/20 0353 10/26/20 0253 10/27/20 0408 10/28/20 0139  NA 138 139 135 136 134*  K 2.9* 4.2 4.2 4.0 4.2  CL 99 102 99 97* 94*  CO2 _1 GLUCOSE 79 101* 97 95 94  BUN _2 CREATININE 1.21* 1.23* 1.24* 1.23* 1.23*  CALCIUM 9.0 9.4 9.2 9.1 8.8*  MG 1.5* 2.0 1.8  --  1.8  PHOS 3.3  --   --   --   --    GFR: Estimated Creatinine Clearance: 31.5 mL/min (A) (by C-G formula based on SCr of 1.23 mg/dL (H)). Liver Function Tests: Recent Labs  Lab 10/24/20 0502 10/25/20 0353 10/26/20 0253 10/27/20 0408  10/28/20 0139  AST 13* 12* 10* 20 32  ALT _3 ALKPHOS 63 56 53 49 51  BILITOT 1.5* 0.9 0.9 0.7 0.5  PROT 6.5 6.1* 5.8* 6.3* 6.0*  ALBUMIN 3.4* 2.9* 2.6* 2.7* 2.5*   No results for input(s): LIPASE, AMYLASE in the last 168 hours. No results for input(s): AMMONIA in the last 168 hours. Coagulation Profile: Recent Labs  Lab 10/24/20 0502  INR 1.1   Cardiac Enzymes: No results for input(s): CKTOTAL, CKMB, CKMBINDEX, TROPONINI in the last 168 hours. BNP (last 3 results) No results for input(s): PROBNP in the last 8760 hours. HbA1C: No results for input(s): HGBA1C in the last 72 hours. CBG: No results for input(s): GLUCAP in the last 168 hours. Lipid Profile: No results for input(s): CHOL, HDL, LDLCALC, TRIG, CHOLHDL, LDLDIRECT in the last 72 hours. Thyroid Function Tests: No results for input(s): TSH, T4TOTAL, FREET4, T3FREE, THYROIDAB in the last 72 hours. Anemia Panel: No results for input(s): VITAMINB12, FOLATE, FERRITIN, TIBC, IRON, RETICCTPCT in the last 72 hours. Sepsis Labs: Recent Labs  Lab 10/23/20 1426 10/23/20 1607 10/27/20 0833  PROCALCITON  --   --  1.26  LATICACIDVEN 1.1 0.8  --     Recent Results (from the past 240 hour(s))  Blood culture (routine x 2)     Status: None   Collection Time: 10/23/20  2:10 PM   Specimen: BLOOD  Result Value Ref Range Status   Specimen Description BLOOD SITE NOT SPECIFIED  Final   Special Requests   Final    BOTTLES DRAWN AEROBIC AND ANAEROBIC Blood Culture adequate volume   Culture   Final    NO GROWTH 5 DAYS Performed at [facility], [address] [place], [place] [address]    Report Status 10/28/2020 FINAL  Final  Blood culture (routine x 2)     Status: None   Collection Time: 10/23/20  2:26 PM   Specimen: BLOOD  Result Value Ref Range Status   Specimen Description BLOOD SITE NOT SPECIFIED  Final   Special Requests   Final    BOTTLES DRAWN AEROBIC AND ANAEROBIC Blood Culture adequate volume    Culture   Final    NO GROWTH 5 DAYS Performed at [facility], [place] [address], [place], [place] [address]    Report Status 10/28/2020 FINAL  Final  Resp Panel by RT-PCR (Flu A&B, Covid) Nasopharyngeal Swab     Status: None   Collection Time: 10/27/20 10:54 AM   Specimen: Nasopharyngeal Swab; Nasopharyngeal(NP) swabs in vial transport medium  Result Value Ref Range Status   SARS Coronavirus 2 by RT PCR NEGATIVE NEGATIVE Final    Comment: (NOTE) SARS-CoV-2 target nucleic acids are NOT DETECTED.  The SARS-CoV-2 RNA is generally detectable in upper respiratory specimens during the acute phase of infection. The lowest concentration of SARS-CoV-2 viral copies this assay can detect is 138 copies/mL. A negative result does not preclude SARS-Cov-2 infection and should not be used as the sole basis for treatment or other patient management decisions. A negative result may occur with  improper specimen collection/handling, submission of specimen other than nasopharyngeal swab, presence of viral mutation(s) within the areas targeted by this assay, and inadequate number of viral copies(<138 copies/mL). A negative result must be combined with clinical observations, patient history, and epidemiological information. The expected result is Negative.  Fact Sheet for Patients:  EntrepreneurPulse.com.au  Fact Sheet for Healthcare Providers:  IncredibleEmployment.be  This test is no t yet approved or cleared by the [place] FDA and  has been authorized for detection and/or diagnosis of SARS-CoV-2 by FDA under an Emergency Use Authorization (EUA). This  EUA will remain  in effect (meaning this test can be used) for the duration of the COVID-19 declaration under Section 564(b)(1) of the Act, 21 U.S.C.section 360bbb-3(b)(1), unless the authorization is terminated  or revoked sooner.       Influenza A by PCR NEGATIVE NEGATIVE Final   Influenza B by PCR  NEGATIVE NEGATIVE Final    Comment: (NOTE) The Xpert Xpress SARS-CoV-2/FLU/RSV plus assay is intended as an aid in the diagnosis of influenza from Nasopharyngeal swab specimens and should not be used as a sole basis for treatment. Nasal washings and aspirates are unacceptable for Xpert Xpress SARS-CoV-2/FLU/RSV testing.  Fact Sheet for Patients: EntrepreneurPulse.com.au  Fact Sheet for Healthcare Providers: IncredibleEmployment.be  This test is not yet approved or cleared by the [place] FDA and has been authorized for detection and/or diagnosis of SARS-CoV-2 by FDA under an Emergency Use Authorization (EUA). This EUA will remain in effect (meaning this test can be used) for the duration of the COVID-19 declaration under Section 564(b)(1) of the Act, 21 U.S.C. section 360bbb-3(b)(1), unless the authorization is terminated or revoked.  Performed at [facility], [place] [address] [place], [place] [address]   Culture, blood (routine x 2)     Status: None (Preliminary result)   Collection Time: 10/27/20  2:00 PM   Specimen: BLOOD  Result Value Ref Range Status   Specimen Description BLOOD LEFT ANTECUBITAL  Final   Special Requests   Final    BOTTLES DRAWN AEROBIC AND ANAEROBIC Blood Culture adequate volume   Culture   Final    NO GROWTH < 24 HOURS Performed at [facility], [place] [address] [place], [place] [address]    Report Status PENDING  Incomplete  Culture, blood (routine x 2)     Status: None (Preliminary result)   Collection Time: 10/27/20  2:05 PM   Specimen: BLOOD  Result Value Ref Range Status   Specimen Description BLOOD RIGHT ANTECUBITAL  Final   Special Requests   Final    BOTTLES DRAWN AEROBIC AND ANAEROBIC Blood Culture adequate volume   Culture  Setup Time   Final    GRAM POSITIVE COCCI IN CLUSTERS AEROBIC BOTTLE ONLY CRITICAL RESULT CALLED TO, READ BACK BY AND VERIFIED WITH: PHARMD JEREMY FRENN AT 4287 ON  10/28/20 BY KJ Performed at [facility], [place] [address], [place],  [address]    Culture GRAM POSITIVE COCCI  Final   Report Status PENDING  Incomplete  Blood Culture ID Panel (Reflexed)     Status: Abnormal   Collection Time: 10/27/20  2:05 PM  Result Value Ref Range Status   Enterococcus faecalis NOT DETECTED NOT DETECTED Final   Enterococcus Faecium NOT DETECTED NOT DETECTED Final   Listeria monocytogenes NOT DETECTED NOT DETECTED Final   Staphylococcus species DETECTED (A) NOT DETECTED Final    Comment: CRITICAL RESULT CALLED TO, READ BACK BY AND VERIFIED WITH: [place] ON 10/28/20    Staphylococcus aureus (BCID) DETECTED (A) NOT DETECTED Final    Comment: Methicillin (oxacillin)-resistant Staphylococcus aureus (MRSA). MRSA is predictably resistant to beta-lactam antibiotics (except ceftaroline). Preferred therapy is vancomycin unless clinically contraindicated. Patient requires contact precautions if  hospitalized. CRITICAL RESULT CALLED TO, READ BACK BY AND VERIFIED WITH: PHARMD JEREMY FRENN AT 0919 ON 10/28/20 BY KJ    Staphylococcus epidermidis NOT DETECTED NOT DETECTED Final   Staphylococcus lugdunensis NOT DETECTED NOT DETECTED Final   Streptococcus species NOT DETECTED NOT  DETECTED Final   Streptococcus agalactiae NOT DETECTED NOT DETECTED Final   Streptococcus pneumoniae NOT DETECTED NOT DETECTED Final   Streptococcus pyogenes NOT DETECTED NOT DETECTED Final   A.calcoaceticus-baumannii NOT DETECTED NOT DETECTED Final   Bacteroides fragilis NOT DETECTED NOT DETECTED Final   Enterobacterales NOT DETECTED NOT DETECTED Final   Enterobacter cloacae complex NOT DETECTED NOT DETECTED Final   Escherichia coli NOT DETECTED NOT DETECTED Final   Klebsiella aerogenes NOT DETECTED NOT DETECTED Final   Klebsiella oxytoca NOT DETECTED NOT DETECTED Final   Klebsiella pneumoniae NOT DETECTED NOT DETECTED Final   Proteus species NOT DETECTED NOT  DETECTED Final   Salmonella species NOT DETECTED NOT DETECTED Final   Serratia marcescens NOT DETECTED NOT DETECTED Final   Haemophilus influenzae NOT DETECTED NOT DETECTED Final   Neisseria meningitidis NOT DETECTED NOT DETECTED Final   Pseudomonas aeruginosa NOT DETECTED NOT DETECTED Final   Stenotrophomonas maltophilia NOT DETECTED NOT DETECTED Final   Candida albicans NOT DETECTED NOT DETECTED Final   Candida auris NOT DETECTED NOT DETECTED Final   Candida glabrata NOT DETECTED NOT DETECTED Final   Candida krusei NOT DETECTED NOT DETECTED Final   Candida parapsilosis NOT DETECTED NOT DETECTED Final   Candida tropicalis NOT DETECTED NOT DETECTED Final   Cryptococcus neoformans/gattii NOT DETECTED NOT DETECTED Final   Meth resistant mecA/C and MREJ DETECTED (A) NOT DETECTED Final    Comment: CRITICAL RESULT CALLED TO, READ BACK BY AND VERIFIED WITH: PHARMD JEREMY FRENN AT 8182 ON 10/28/20 BY KJ Performed at [facility], [address] [place], [place] [address]   Respiratory (~20 pathogens) panel by PCR     Status: None   Collection Time: 10/27/20  5:00 PM   Specimen: Nasopharyngeal Swab; Respiratory  Result Value Ref Range Status   Adenovirus NOT DETECTED NOT DETECTED Final   Coronavirus 229E NOT DETECTED NOT DETECTED Final    Comment: (NOTE) The Coronavirus on the Respiratory Panel, DOES NOT test for the novel  Coronavirus (2019 nCoV)    Coronavirus HKU1 NOT DETECTED NOT DETECTED Final   Coronavirus NL63 NOT DETECTED NOT DETECTED Final   Coronavirus OC43 NOT DETECTED NOT DETECTED Final   Metapneumovirus NOT DETECTED NOT DETECTED Final   Rhinovirus / Enterovirus NOT DETECTED NOT DETECTED Final   Influenza A NOT DETECTED NOT DETECTED Final   Influenza B NOT DETECTED NOT DETECTED Final   Parainfluenza Virus 1 NOT DETECTED NOT DETECTED Final   Parainfluenza Virus 2 NOT DETECTED NOT DETECTED Final   Parainfluenza Virus 3 NOT DETECTED NOT DETECTED Final   Parainfluenza  Virus 4 NOT DETECTED NOT DETECTED Final   Respiratory Syncytial Virus NOT DETECTED NOT DETECTED Final   Bordetella pertussis NOT DETECTED NOT DETECTED Final   Bordetella Parapertussis NOT DETECTED NOT DETECTED Final   Chlamydophila pneumoniae NOT DETECTED NOT DETECTED Final   Mycoplasma pneumoniae NOT DETECTED NOT DETECTED Final    Comment: Performed at [facility], [place]. [address] [place], [place] [address]      Radiology Studies: CT ABDOMEN PELVIS WO CONTRAST  Result Date: 10/27/2020 CLINICAL DATA:  80 year old female with fever of unknown origin. EXAM: CT ABDOMEN AND PELVIS WITHOUT CONTRAST TECHNIQUE: Multidetector CT imaging of the abdomen and pelvis was performed following the standard protocol without IV contrast. COMPARISON:  CT abdomen pelvis dated 10/16/2014. FINDINGS: Evaluation of this exam is limited in the absence of intravenous contrast. Lower chest: There are bibasilar linear atelectasis/scarring. There is coronary vascular calcification. No intra-abdominal  free air or free fluid. Hepatobiliary: The liver is unremarkable. No intrahepatic biliary ductal dilatation. There is layering sludge or small stones within the gallbladder. No pericholecystic fluid or evidence of acute cholecystitis by CT. Pancreas: Unremarkable. No pancreatic ductal dilatation or surrounding inflammatory changes. Spleen: Normal in size without focal abnormality. Adrenals/Urinary Tract: The adrenal glands are unremarkable. Atrophic left kidney. There is no hydronephrosis or nephrolithiasis on the right. The visualized ureters and urinary bladder appear unremarkable. Stomach/Bowel: There is moderate stool throughout the colon. Small scattered colonic diverticula without active inflammatory changes. There is no bowel obstruction or active inflammation. Appendectomy. Vascular/Lymphatic: Advanced aortoiliac atherosclerotic disease. The IVC is unremarkable. No portal venous gas. There is no adenopathy. Reproductive:  Hysterectomy. No adnexal masses. Other: Postsurgical changes of anterior pelvic wall, likely related to prior hernia repair. There is a small fat containing umbilical hernia. No bowel herniation, fluid collection, or inflammatory changes. Focal area of induration of the subcutaneous soft tissues of the left anterior pelvic wall, likely related to subcutaneous injection. Musculoskeletal: Degenerative changes of the spine. No acute osseous pathology. IMPRESSION: 1. No acute intra-abdominal or pelvic pathology. 2. Moderate colonic stool burden. No bowel obstruction. 3. Aortic Atherosclerosis (ICD10-I70.0). Electronically Signed   By: Anner Crete M.D.   On: 10/27/2020 20:19    Scheduled Meds: . amLODipine  5 mg Oral Daily  . aspirin  81 mg Oral Daily  . busPIRone  15 mg Oral TID  . Chlorhexidine Gluconate Cloth  6 each Topical Daily  . cholecalciferol  1,000 Units Oral Daily  . clopidogrel  75 mg Oral q morning  . divalproex  125 mg Oral BID  . dorzolamide  1 drop Both Eyes BID   And  . timolol  1 drop Both Eyes BID  . enoxaparin (LOVENOX) injection  30 mg Subcutaneous Q24H  . ferrous sulfate  325 mg Oral Daily  . furosemide  40 mg Intravenous Daily  . gabapentin  100 mg Oral TID  . latanoprost  1 drop Both Eyes QHS  . mirtazapine  15 mg Oral QHS  . pantoprazole  40 mg Oral Daily  . vitamin B-12  100 mcg Oral Daily   Continuous Infusions: . DAPTOmycin (CUBICIN)  IV 500 mg (10/28/20 1117)     LOS: 5 days   Time spent: 32 minutes   Darliss Cheney, MD [facility]  10/28/2020, 11:18 AM   To contact the attending provider between 7A-7P or the covering provider during after hours 7P-7A, please log into the web site [email or web address].

## 2020-10-28 NOTE — Progress Notes (Signed)
Occupational Therapy Treatment Patient Details Name: [name] [name] MRN: [id number] DOB: 1940/11/23 Today's Date: 10/28/2020    History of present illness [name] [name] is a 80 y/o female who was sent to ED on 10/23/20 after her home health nurse noticed hypoxia and an irregular HR. Pt admitted due to SOB with further work up pending. Pt with recent admission on 09/01/20 for sepsis secondary to PNA. PMH includes CKD, congenital solitary kideny, HTN, and HLD.   OT comments  Pt making good progress with functional goals. Session focused on bed mobility to sit EOB for UB dressing and grooming tasks, sit - stand to RW for SPTs to toilet for assist with toileting. OT will continue to follow acutely to maximize level of function and safety.  Follow Up Recommendations  SNF;Supervision/Assistance - 24 hour    Equipment Recommendations  3 in 1 bedside commode    Recommendations for Other Services      Precautions / Restrictions Precautions Precautions: Fall Precaution Comments: monitor O2 Restrictions Weight Bearing Restrictions: No       Mobility Bed Mobility Overal bed mobility: Needs Assistance Bed Mobility: Supine to Sit     Supine to sit: Mod assist     General bed mobility comments: mod A to bring LEs to EOB and to use pad to scoot pt hips forward    Transfers Overall transfer level: Needs assistance Equipment used: Rolling walker (2 wheeled) Transfers: Sit to/from [facility] Sit to Stand: Min assist;+2 physical assistance Stand pivot transfers: Min assist       General transfer comment: mobility with cues and assist for managing RW to SPT to [facility] then to recliner    Balance Overall balance assessment: Needs assistance Sitting-balance support: Feet supported;Single extremity supported Sitting balance-Leahy Scale: Fair     Standing balance support: Bilateral upper extremity supported;During functional activity Standing balance-Leahy Scale: Poor                              ADL either performed or assessed with clinical judgement   ADL Overall ADL's : Needs assistance/impaired     Grooming: Min guard;Wash/dry face;Standing;Wash/dry hands   Upper Body Bathing: Min guard;Sitting       Upper Body Dressing : Min guard;Sitting       Toilet Transfer: Minimal assistance;Stand-pivot;BSC;RW;Cueing for safety;Cueing for sequencing   Toileting- Clothing Manipulation and Hygiene: Maximal assistance;Total assistance Toileting - Clothing Manipulation Details (indicate cue type and reason): max A with clothing mgt, total A with posterior hygiene     Functional mobility during ADLs: Minimal assistance;Rolling walker;Cueing for safety       Vision Baseline Vision/History: No visual deficits Patient Visual Report: No change from baseline     Perception     Praxis      Cognition Arousal/Alertness: Awake/alert Behavior During Therapy: WFL for tasks assessed/performed Overall Cognitive Status: No family/caregiver present to determine baseline cognitive functioning Area of Impairment: Problem solving;Memory                             Problem Solving: Difficulty sequencing;Requires verbal cues;Slow processing          Exercises     Shoulder Instructions       General Comments      Pertinent Vitals/ Pain       Pain Assessment: Faces Faces Pain Scale: Hurts little more Pain Location: BLEs sores Pain Descriptors /  Indicators: Discomfort;Sore Pain Intervention(s): Limited activity within patient's tolerance;Monitored during session;Repositioned  Home Living                                          Prior Functioning/Environment              Frequency  Min 2X/week        Progress Toward Goals  OT Goals(current goals can now be found in the care plan section)  Progress towards OT goals: Progressing toward goals     Plan Discharge plan remains appropriate     Co-evaluation                 AM-PAC OT "6 Clicks" Daily Activity     Outcome Measure   Help from another person eating meals?: None Help from another person taking care of personal grooming?: A Little Help from another person toileting, which includes using toliet, bedpan, or urinal?: A Lot Help from another person bathing (including washing, rinsing, drying)?: A Little Help from another person to put on and taking off regular upper body clothing?: A Little Help from another person to put on and taking off regular lower body clothing?: A Lot 6 Click Score: 17    End of Session Equipment Utilized During Treatment: Gait belt;Rolling walker;Other (comment) (BSC)  OT Visit Diagnosis: Unsteadiness on feet (R26.81);Other abnormalities of gait and mobility (R26.89);Muscle weakness (generalized) (M62.81);Other symptoms and signs involving cognitive function;Pain Pain - Right/Left:  (B LEs) Pain - part of body: Leg   Activity Tolerance Patient tolerated treatment well   Patient Left in chair;with call bell/phone within reach;with chair alarm set;with nursing/sitter in room   Nurse Communication          Time: 7262-0355 OT Time Calculation (min): 27 min  Charges: OT General Charges $OT Visit: 1 Visit OT Treatments $Therapeutic Activity: 8-22 mins     Britt Bottom 10/28/2020, 1:42 PM

## 2020-10-28 NOTE — Progress Notes (Signed)
Noted blood sputum. Pt said it just felt like it was stringing in her throat and then she spit it up. Removed dentures oral cavity pink, moist and intact. Noted dried blood drainage in right/left nares. MD made aware.

## 2020-10-28 NOTE — TOC Progression Note (Addendum)
Transition of Care [facility]) - Progression Note    Patient Details  Name: GENELLE ECONOMOU MRN: [id number] Date of Birth: 03/25/1941  [facility] of [facility] CM/SW Contact  Joanne Chars, LCSW Phone Number: 10/28/2020, 11:17 AM  Clinical Narrative: CSW spoke with pt daughter [name].  She said she is planning for pt to return home to her trailer after rehab and [name] will continue to be available for support.  CSW asked about reports that she was working 2 jobs and not home much.  [name] said this is not accurate, she is actually on disability and limited in how much she can work.  She recently had a very high light bill and has been doing some work to pay that down.  [name] said she is home and available all day every day except once per week that she is at her aunt's home and pt could come with her on that day.  Son [name] is a back up resource if needed.  [name] still requesting SNF prior to return home.    1130: Progression meeting, Dr Doristine Bosworth, RN Jeanann Lewandowsky, CSW discussed pt situation.  Pt has been coherant during Therapist, sports conversations.  CSW shared family information above.  Team still recommending SNF services as best post acute plan at this time.       Expected Discharge Plan: [place] Barriers to Discharge: Continued Medical Work up  Expected Discharge Plan and Services Expected Discharge Plan: [place] Choice: [place] arrangements for the past 2 months: Single Family Home                                       Social Determinants of Health (SDOH) Interventions    Readmission Risk Interventions No flowsheet data found.

## 2020-10-28 NOTE — Progress Notes (Signed)
Physical Therapy Treatment Patient Details Name: [name] [name] MRN: [id number] DOB: 1940-07-27 Today's Date: 10/28/2020    History of Present Illness 80 y/o female who was sent to ED on 10/23/20 after her home health nurse noticed hypoxia and an irregular HR. Pt admitted due to SOB with further work up pending. Pt with recent admission on 09/01/20 for sepsis secondary to PNA. PMH includes CKD, congenital solitary kideny, HTN, and HLD.    PT Comments    Pt was seen for mobility with exercises on chair as she reports being too fatigued to stand up and take a few steps.  Pt reports nursing assisted with 2 staff members to get her to chair, and she did well in pt's estimation with walking to the chair.  Follow up with more standing and encourage her to get OOB to chair regularly.  SNF plan is still practical esp given pt has infection issues that will require medical follow up.     Follow Up Recommendations  SNF     Equipment Recommendations  None recommended by PT    Recommendations for Other Services       Precautions / Restrictions Precautions Precautions: Fall Precaution Comments: monitor O2 Restrictions Weight Bearing Restrictions: No    Mobility  Bed Mobility Overal bed mobility: Needs Assistance Bed Mobility: Supine to Sit     Supine to sit: Mod assist     General bed mobility comments: up in chair when PT arrived    Transfers Overall transfer level: Needs assistance Equipment used: Rolling walker (2 wheeled) Transfers: Sit to/from [facility] Sit to Stand: Min assist;+2 physical assistance Stand pivot transfers: Min assist       General transfer comment: up in chair and declines to stand  Ambulation/Gait                 Stairs             Wheelchair Mobility    Modified Rankin (Stroke Patients Only)       Balance Overall balance assessment: Needs assistance Sitting-balance support: Feet supported;Single extremity  supported Sitting balance-Leahy Scale: Fair     Standing balance support: Bilateral upper extremity supported;During functional activity Standing balance-Leahy Scale: Poor                              Cognition Arousal/Alertness: Awake/alert Behavior During Therapy: WFL for tasks assessed/performed Overall Cognitive Status: No family/caregiver present to determine baseline cognitive functioning Area of Impairment: Awareness;Safety/judgement;Following commands;Memory;Attention;Problem solving                   Current Attention Level: Selective Memory: Decreased short-term memory Following Commands: Follows one step commands inconsistently;Follows one step commands with increased time Safety/Judgement: Decreased awareness of safety;Decreased awareness of deficits Awareness: Intellectual Problem Solving: Slow processing;Requires verbal cues;Requires tactile cues General Comments: requires repetitive instructions      Exercises General Exercises - Lower Extremity Ankle Circles/Pumps: AROM;Both;5 reps Quad Sets: AROM;10 reps Heel Slides: AAROM;10 reps Hip ABduction/ADduction: AAROM;10 reps Straight Leg Raises: AAROM;10 reps Hip Flexion/Marching: AAROM;10 reps    General Comments        Pertinent Vitals/Pain Pain Assessment: Faces Faces Pain Scale: Hurts little more Pain Location: BLEs sores Pain Descriptors / Indicators: Grimacing;Tender Pain Intervention(s): Limited activity within patient's tolerance;Monitored during session;Repositioned    Home Living  Prior Function            PT Goals (current goals can now be found in the care plan section) Acute Rehab PT Goals Patient Stated Goal: less pain in legs; go home with son's assist    Frequency    Min 3X/week      PT Plan Current plan remains appropriate    Co-evaluation              AM-PAC PT "6 Clicks" Mobility   Outcome Measure  Help needed  turning from your back to your side while in a flat bed without using bedrails?: A Lot Help needed moving from lying on your back to sitting on the side of a flat bed without using bedrails?: A Lot Help needed moving to and from a bed to a chair (including a wheelchair)?: A Lot Help needed standing up from a chair using your arms (e.g., wheelchair or bedside chair)?: A Little Help needed to walk in hospital room?: A Lot Help needed climbing 3-5 steps with a railing? : Total 6 Click Score: 12    End of Session   Activity Tolerance: Patient limited by fatigue Patient left: in chair;with call bell/phone within reach;with chair alarm set Nurse Communication: Mobility status PT Visit Diagnosis: Unsteadiness on feet (R26.81);Muscle weakness (generalized) (M62.81);Difficulty in walking, not elsewhere classified (R26.2)     Time: 0277-4128 PT Time Calculation (min) (ACUTE ONLY): 24 min  Charges:  $Therapeutic Exercise: 23-37 mins                 Ramond Dial 10/28/2020, 2:12 PM  Mee Hives, PT MS Acute Rehab Dept. Number: [place]

## 2020-10-28 NOTE — Plan of Care (Signed)
  Problem: Activity: Goal: Ability to tolerate increased activity will improve Outcome: Progressing   Problem: Clinical Measurements: Goal: Ability to maintain a body temperature in the normal range will improve Outcome: Progressing   Problem: Respiratory: Goal: Ability to maintain adequate ventilation will improve Outcome: Progressing Goal: Ability to maintain a clear airway will improve Outcome: Progressing   

## 2020-10-28 NOTE — Progress Notes (Signed)
PHARMACY - PHYSICIAN COMMUNICATION CRITICAL VALUE ALERT - BLOOD CULTURE IDENTIFICATION (BCID)  [name] [name] is an 80 y.o. female who presented to [facility] on 10/23/2020 with a chief complaint of weakness and shortness of breath  Assessment:  One Blood culture obtained on 4/20 is now positive for MRSA.    Name of physician (or Provider) Contacted: Dr. Doristine Bosworth and Dr. Tommy Medal  Current antibiotics: None  Changes to prescribed antibiotics recommended: Start Daptomycin 500mg  IV q24h ID team will see her today.  Results for orders placed or performed during the hospital encounter of 10/23/20  Blood Culture ID Panel (Reflexed) (Collected: 10/27/2020  2:05 PM)  Result Value Ref Range   Enterococcus faecalis NOT DETECTED NOT DETECTED   Enterococcus Faecium NOT DETECTED NOT DETECTED   Listeria monocytogenes NOT DETECTED NOT DETECTED   Staphylococcus species DETECTED (A) NOT DETECTED   Staphylococcus aureus (BCID) DETECTED (A) NOT DETECTED   Staphylococcus epidermidis NOT DETECTED NOT DETECTED   Staphylococcus lugdunensis NOT DETECTED NOT DETECTED   Streptococcus species NOT DETECTED NOT DETECTED   Streptococcus agalactiae NOT DETECTED NOT DETECTED   Streptococcus pneumoniae NOT DETECTED NOT DETECTED   Streptococcus pyogenes NOT DETECTED NOT DETECTED   A.calcoaceticus-baumannii NOT DETECTED NOT DETECTED   Bacteroides fragilis NOT DETECTED NOT DETECTED   Enterobacterales NOT DETECTED NOT DETECTED   Enterobacter cloacae complex NOT DETECTED NOT DETECTED   Escherichia coli NOT DETECTED NOT DETECTED   Klebsiella aerogenes NOT DETECTED NOT DETECTED   Klebsiella oxytoca NOT DETECTED NOT DETECTED   Klebsiella pneumoniae NOT DETECTED NOT DETECTED   Proteus species NOT DETECTED NOT DETECTED   Salmonella species NOT DETECTED NOT DETECTED   Serratia marcescens NOT DETECTED NOT DETECTED   Haemophilus influenzae NOT DETECTED NOT DETECTED   Neisseria meningitidis NOT DETECTED NOT DETECTED    Pseudomonas aeruginosa NOT DETECTED NOT DETECTED   Stenotrophomonas maltophilia NOT DETECTED NOT DETECTED   Candida albicans NOT DETECTED NOT DETECTED   Candida auris NOT DETECTED NOT DETECTED   Candida glabrata NOT DETECTED NOT DETECTED   Candida krusei NOT DETECTED NOT DETECTED   Candida parapsilosis NOT DETECTED NOT DETECTED   Candida tropicalis NOT DETECTED NOT DETECTED   Cryptococcus neoformans/gattii NOT DETECTED NOT DETECTED   Meth resistant mecA/C and MREJ DETECTED (A) NOT DETECTED    [name] 10/28/2020  9:32 AM

## 2020-10-29 DIAGNOSIS — R7881 Bacteremia: Secondary | ICD-10-CM

## 2020-10-29 DIAGNOSIS — E860 Dehydration: Secondary | ICD-10-CM | POA: Diagnosis not present

## 2020-10-29 DIAGNOSIS — N189 Chronic kidney disease, unspecified: Secondary | ICD-10-CM | POA: Diagnosis not present

## 2020-10-29 DIAGNOSIS — B9562 Methicillin resistant Staphylococcus aureus infection as the cause of diseases classified elsewhere: Secondary | ICD-10-CM

## 2020-10-29 DIAGNOSIS — R627 Adult failure to thrive: Secondary | ICD-10-CM

## 2020-10-29 DIAGNOSIS — I129 Hypertensive chronic kidney disease with stage 1 through stage 4 chronic kidney disease, or unspecified chronic kidney disease: Secondary | ICD-10-CM | POA: Diagnosis not present

## 2020-10-29 DIAGNOSIS — E669 Obesity, unspecified: Secondary | ICD-10-CM

## 2020-10-29 DIAGNOSIS — S81832D Puncture wound without foreign body, left lower leg, subsequent encounter: Secondary | ICD-10-CM

## 2020-10-29 DIAGNOSIS — N179 Acute kidney failure, unspecified: Secondary | ICD-10-CM | POA: Diagnosis not present

## 2020-10-29 DIAGNOSIS — Z6834 Body mass index (BMI) 34.0-34.9, adult: Secondary | ICD-10-CM

## 2020-10-29 DIAGNOSIS — S81832A Puncture wound without foreign body, left lower leg, initial encounter: Secondary | ICD-10-CM

## 2020-10-29 HISTORY — DX: Methicillin resistant Staphylococcus aureus infection as the cause of diseases classified elsewhere: B95.62

## 2020-10-29 LAB — BASIC METABOLIC PANEL
Anion gap: 9 (ref 5–15)
BUN: 23 mg/dL (ref 8–23)
CO2: 31 mmol/L (ref 22–32)
Calcium: 9.1 mg/dL (ref 8.9–10.3)
Chloride: 96 mmol/L — ABNORMAL LOW (ref 98–111)
Creatinine, Ser: 1.08 mg/dL — ABNORMAL HIGH (ref 0.44–1.00)
GFR, Estimated: 52 mL/min — ABNORMAL LOW (ref >60)
Glucose, Bld: 93 mg/dL (ref 70–99)
Potassium: 3.6 mmol/L (ref 3.5–5.1)
Sodium: 136 mmol/L (ref 135–145)

## 2020-10-29 LAB — CBC WITH DIFFERENTIAL/PLATELET
Abs Immature Granulocytes: 0.03 10*3/uL (ref 0.00–0.07)
Basophils Absolute: 0 10*3/uL (ref 0.0–0.1)
Basophils Relative: 0 %
Eosinophils Absolute: 0.4 10*3/uL (ref 0.0–0.5)
Eosinophils Relative: 6 %
HCT: 31.8 % — ABNORMAL LOW (ref 36.0–46.0)
Hemoglobin: 10.6 g/dL — ABNORMAL LOW (ref 12.0–15.0)
Immature Granulocytes: 1 %
Lymphocytes Relative: 26 %
Lymphs Abs: 1.7 10*3/uL (ref 0.7–4.0)
MCH: 33.1 pg (ref 26.0–34.0)
MCHC: 33.3 g/dL (ref 30.0–36.0)
MCV: 99.4 fL (ref 80.0–100.0)
Monocytes Absolute: 0.8 10*3/uL (ref 0.1–1.0)
Monocytes Relative: 12 %
Neutro Abs: 3.6 10*3/uL (ref 1.7–7.7)
Neutrophils Relative %: 55 %
Platelets: 221 10*3/uL (ref 150–400)
RBC: 3.2 MIL/uL — ABNORMAL LOW (ref 3.87–5.11)
RDW: 14.2 % (ref 11.5–15.5)
WBC: 6.4 10*3/uL (ref 4.0–10.5)
nRBC: 0 % (ref 0.0–0.2)

## 2020-10-29 LAB — CK: Total CK: 130 U/L (ref 38–234)

## 2020-10-29 LAB — ANA: Anti Nuclear Antibody (ANA): POSITIVE — AB

## 2020-10-29 LAB — RHEUMATOID FACTOR: Rheumatoid fact SerPl-aCnc: 18.9 IU/mL — ABNORMAL HIGH (ref <14.0)

## 2020-10-29 MED ORDER — POLYETHYLENE GLYCOL 3350 17 G PO PACK
17.0000 g | PACK | Freq: Every day | ORAL | Status: DC
  Administered 2020-10-29 – 2020-11-04 (×6): 17 g via ORAL
  Filled 2020-10-29 (×8): qty 1

## 2020-10-29 NOTE — Plan of Care (Signed)
  Problem: Activity: Goal: Ability to tolerate increased activity will improve Outcome: Progressing   Problem: Clinical Measurements: Goal: Ability to maintain a body temperature in the normal range will improve Outcome: Progressing   Problem: Respiratory: Goal: Ability to maintain adequate ventilation will improve Outcome: Progressing Goal: Ability to maintain a clear airway will improve Outcome: Progressing   

## 2020-10-29 NOTE — Progress Notes (Signed)
Subjective: No new complaints   Antibiotics:  Anti-infectives (From admission, onward)   Start     Dose/Rate Route Frequency Ordered Stop   10/28/20 1030  DAPTOmycin (CUBICIN) 500 mg in sodium chloride 0.9 % IVPB        500 mg 120 mL/hr over 30 Minutes Intravenous Daily 10/28/20 0930        Medications: Scheduled Meds: . amLODipine  5 mg Oral Daily  . aspirin  81 mg Oral Daily  . busPIRone  15 mg Oral TID  . Chlorhexidine Gluconate Cloth  6 each Topical Daily  . cholecalciferol  1,000 Units Oral Daily  . clopidogrel  75 mg Oral q morning  . divalproex  125 mg Oral BID  . dorzolamide  1 drop Both Eyes BID   And  . timolol  1 drop Both Eyes BID  . enoxaparin (LOVENOX) injection  30 mg Subcutaneous Q24H  . ferrous sulfate  325 mg Oral Daily  . furosemide  40 mg Intravenous Daily  . gabapentin  100 mg Oral TID  . latanoprost  1 drop Both Eyes QHS  . mirtazapine  15 mg Oral QHS  . pantoprazole  40 mg Oral Daily  . vitamin B-12  100 mcg Oral Daily   Continuous Infusions: . DAPTOmycin (CUBICIN)  IV 500 mg (10/28/20 1117)   PRN Meds:.acetaminophen, albuterol, sodium chloride, traMADol    Objective: Weight change:   Intake/Output Summary (Last 24 hours) at 10/29/2020 1230 Last data filed at 10/29/2020 1116 Gross per 24 hour  Intake 840 ml  Output 2400 ml  Net -1560 ml   Blood pressure (!) 135/50, pulse 100, temperature 98.9 F (37.2 C), temperature source Oral, resp. rate 19, weight 71.1 kg, SpO2 97 %. Temp:  [98.1 F (36.7 C)-98.9 F (37.2 C)] 98.9 F (37.2 C) (04/22 0506) Pulse Rate:  [100-102] 100 (04/22 0506) Resp:  [16-19] 19 (04/22 0506) BP: (108-135)/(50-77) 135/50 (04/22 0506) SpO2:  [97 %-98 %] 97 % (04/22 0506)  Physical Exam: Physical Exam Constitutional:      General: She is not in acute distress.    Appearance: She is well-developed. She is not diaphoretic.  HENT:     Head: Normocephalic and atraumatic.     Right Ear: External ear  normal.     Left Ear: External ear normal.     Mouth/Throat:     Pharynx: No oropharyngeal exudate.  Eyes:     General: No scleral icterus.    Conjunctiva/sclera: Conjunctivae normal.     Pupils: Pupils are equal, round, and reactive to light.  Cardiovascular:     Rate and Rhythm: Normal rate and regular rhythm.     Heart sounds: Normal heart sounds. No murmur heard. No friction rub. No gallop.   Pulmonary:     Effort: Pulmonary effort is normal. No respiratory distress.     Breath sounds: Normal breath sounds. No wheezing or rales.  Abdominal:     General: Bowel sounds are normal. There is no distension.     Palpations: Abdomen is soft.     Tenderness: There is no abdominal tenderness. There is no rebound.  Musculoskeletal:        General: No tenderness. Normal range of motion.  Lymphadenopathy:     Cervical: No cervical adenopathy.  Skin:    General: Skin is warm and dry.     Coloration: Skin is not pale.     Findings: No erythema or rash.  Neurological:  General: No focal deficit present.     Mental Status: She is alert and oriented to person, place, and time.     Motor: No abnormal muscle tone.     Coordination: Coordination normal.  Psychiatric:        Mood and Affect: Mood normal.        Behavior: Behavior normal.        Thought Content: Thought content normal.        Judgment: Judgment normal.     Lower extremities with venous stasis changes and areas that have bled recently and scabbed October 29, 2020:      CBC:    BMET Recent Labs    10/28/20 0139 10/29/20 0722  NA 134* 136  K 4.2 3.6  CL 94* 96*  CO2 31 31  GLUCOSE 94 93  BUN 22 23  CREATININE 1.23* 1.08*  CALCIUM 8.8* 9.1     Liver Panel  Recent Labs    10/27/20 0408 10/28/20 0139  PROT 6.3* 6.0*  ALBUMIN 2.7* 2.5*  AST 20 32  ALT 8 11  ALKPHOS 49 51  BILITOT 0.7 0.5       Sedimentation Rate Recent Labs    10/28/20 0653  ESRSEDRATE 128*   C-Reactive Protein Recent  Labs    10/27/20 0833 10/28/20 0653  CRP 25.1* 19.4*    Micro Results: Recent Results (from the past 720 hour(s))  Blood culture (routine x 2)     Status: None   Collection Time: 10/23/20  2:10 PM   Specimen: BLOOD  Result Value Ref Range Status   Specimen Description BLOOD SITE NOT SPECIFIED  Final   Special Requests   Final    BOTTLES DRAWN AEROBIC AND ANAEROBIC Blood Culture adequate volume   Culture   Final    NO GROWTH 5 DAYS Performed at [facility], [address], [place], [place] [address]    Report Status 10/28/2020 FINAL  Final  Blood culture (routine x 2)     Status: None   Collection Time: 10/23/20  2:26 PM   Specimen: BLOOD  Result Value Ref Range Status   Specimen Description BLOOD SITE NOT SPECIFIED  Final   Special Requests   Final    BOTTLES DRAWN AEROBIC AND ANAEROBIC Blood Culture adequate volume   Culture   Final    NO GROWTH 5 DAYS Performed at [facility], [place] [address] [place], [place] [address]    Report Status 10/28/2020 FINAL  Final  Resp Panel by RT-PCR (Flu A&B, Covid) Nasopharyngeal Swab     Status: None   Collection Time: 10/27/20 10:54 AM   Specimen: Nasopharyngeal Swab; Nasopharyngeal(NP) swabs in vial transport medium  Result Value Ref Range Status   SARS Coronavirus 2 by RT PCR NEGATIVE NEGATIVE Final    Comment: (NOTE) SARS-CoV-2 target nucleic acids are NOT DETECTED.  The SARS-CoV-2 RNA is generally detectable in upper respiratory specimens during the acute phase of infection. The lowest concentration of SARS-CoV-2 viral copies this assay can detect is 138 copies/mL. A negative result does not preclude SARS-Cov-2 infection and should not be used as the sole basis for treatment or other patient management decisions. A negative result may occur with  improper specimen collection/handling, submission of specimen other than nasopharyngeal swab, presence of viral mutation(s) within the areas targeted by this assay, and  inadequate number of viral copies(<138 copies/mL). A negative result must be combined with clinical observations, patient history, and epidemiological information. The  expected result is Negative.  Fact Sheet for Patients:  EntrepreneurPulse.com.au  Fact Sheet for Healthcare Providers:  IncredibleEmployment.be  This test is no t yet approved or cleared by the [place] FDA and  has been authorized for detection and/or diagnosis of SARS-CoV-2 by FDA under an Emergency Use Authorization (EUA). This EUA will remain  in effect (meaning this test can be used) for the duration of the COVID-19 declaration under Section 564(b)(1) of the Act, 21 U.S.C.section 360bbb-3(b)(1), unless the authorization is terminated  or revoked sooner.       Influenza A by PCR NEGATIVE NEGATIVE Final   Influenza B by PCR NEGATIVE NEGATIVE Final    Comment: (NOTE) The Xpert Xpress SARS-CoV-2/FLU/RSV plus assay is intended as an aid in the diagnosis of influenza from Nasopharyngeal swab specimens and should not be used as a sole basis for treatment. Nasal washings and aspirates are unacceptable for Xpert Xpress SARS-CoV-2/FLU/RSV testing.  Fact Sheet for Patients: EntrepreneurPulse.com.au  Fact Sheet for Healthcare Providers: IncredibleEmployment.be  This test is not yet approved or cleared by the [place] FDA and has been authorized for detection and/or diagnosis of SARS-CoV-2 by FDA under an Emergency Use Authorization (EUA). This EUA will remain in effect (meaning this test can be used) for the duration of the COVID-19 declaration under Section 564(b)(1) of the Act, 21 U.S.C. section 360bbb-3(b)(1), unless the authorization is terminated or revoked.  Performed at [facility], [place] [address], [place], [place] [address]   Culture, blood (routine x 2)     Status: None (Preliminary result)   Collection Time:  10/27/20  2:00 PM   Specimen: BLOOD  Result Value Ref Range Status   Specimen Description BLOOD LEFT ANTECUBITAL  Final   Special Requests   Final    BOTTLES DRAWN AEROBIC AND ANAEROBIC Blood Culture adequate volume   Culture   Final    NO GROWTH 2 DAYS Performed at [facility], [place] [address] [place], [place] [address]    Report Status PENDING  Incomplete  Culture, blood (routine x 2)     Status: Abnormal (Preliminary result)   Collection Time: 10/27/20  2:05 PM   Specimen: BLOOD  Result Value Ref Range Status   Specimen Description BLOOD RIGHT ANTECUBITAL  Final   Special Requests   Final    BOTTLES DRAWN AEROBIC AND ANAEROBIC Blood Culture adequate volume   Culture  Setup Time   Final    GRAM POSITIVE COCCI IN CLUSTERS AEROBIC BOTTLE ONLY CRITICAL RESULT CALLED TO, READ BACK BY AND VERIFIED WITH: PHARMD JEREMY FRENN AT [id number] ON 10/28/20 BY KJ    Culture (A)  Final    STAPHYLOCOCCUS AUREUS SUSCEPTIBILITIES TO FOLLOW Performed at [facility], [place] [address], [place], [place] [address]    Report Status PENDING  Incomplete  Blood Culture ID Panel (Reflexed)     Status: Abnormal   Collection Time: 10/27/20  2:05 PM  Result Value Ref Range Status   Enterococcus faecalis NOT DETECTED NOT DETECTED Final   Enterococcus Faecium NOT DETECTED NOT DETECTED Final   Listeria monocytogenes NOT DETECTED NOT DETECTED Final   Staphylococcus species DETECTED (A) NOT DETECTED Final    Comment: CRITICAL RESULT CALLED TO, READ BACK BY AND VERIFIED WITH: [place] ON 10/28/20    Staphylococcus aureus (BCID) DETECTED (A) NOT DETECTED Final    Comment: Methicillin (oxacillin)-resistant Staphylococcus aureus (MRSA). MRSA is predictably resistant to beta-lactam antibiotics (except ceftaroline).  Preferred therapy is vancomycin unless clinically contraindicated. Patient requires contact precautions if  hospitalized. CRITICAL RESULT CALLED TO, READ BACK BY AND  VERIFIED WITH: [place] AT 0919 ON 10/28/20 BY KJ    Staphylococcus epidermidis NOT DETECTED NOT DETECTED Final   Staphylococcus lugdunensis NOT DETECTED NOT DETECTED Final   Streptococcus species NOT DETECTED NOT DETECTED Final   Streptococcus agalactiae NOT DETECTED NOT DETECTED Final   Streptococcus pneumoniae NOT DETECTED NOT DETECTED Final   Streptococcus pyogenes NOT DETECTED NOT DETECTED Final   A.calcoaceticus-baumannii NOT DETECTED NOT DETECTED Final   Bacteroides fragilis NOT DETECTED NOT DETECTED Final   Enterobacterales NOT DETECTED NOT DETECTED Final   Enterobacter cloacae complex NOT DETECTED NOT DETECTED Final   Escherichia coli NOT DETECTED NOT DETECTED Final   Klebsiella aerogenes NOT DETECTED NOT DETECTED Final   Klebsiella oxytoca NOT DETECTED NOT DETECTED Final   Klebsiella pneumoniae NOT DETECTED NOT DETECTED Final   Proteus species NOT DETECTED NOT DETECTED Final   Salmonella species NOT DETECTED NOT DETECTED Final   Serratia marcescens NOT DETECTED NOT DETECTED Final   Haemophilus influenzae NOT DETECTED NOT DETECTED Final   Neisseria meningitidis NOT DETECTED NOT DETECTED Final   Pseudomonas aeruginosa NOT DETECTED NOT DETECTED Final   Stenotrophomonas maltophilia NOT DETECTED NOT DETECTED Final   Candida albicans NOT DETECTED NOT DETECTED Final   Candida auris NOT DETECTED NOT DETECTED Final   Candida glabrata NOT DETECTED NOT DETECTED Final   Candida krusei NOT DETECTED NOT DETECTED Final   Candida parapsilosis NOT DETECTED NOT DETECTED Final   Candida tropicalis NOT DETECTED NOT DETECTED Final   Cryptococcus neoformans/gattii NOT DETECTED NOT DETECTED Final   Meth resistant mecA/C and MREJ DETECTED (A) NOT DETECTED Final    Comment: CRITICAL RESULT CALLED TO, READ BACK BY AND VERIFIED WITH: PHARMD JEREMY FRENN AT 4268 ON 10/28/20 BY KJ Performed at [facility], [address], [place], [place] [address]   Respiratory (~20 pathogens) panel  by PCR     Status: None   Collection Time: 10/27/20  5:00 PM   Specimen: Nasopharyngeal Swab; Respiratory  Result Value Ref Range Status   Adenovirus NOT DETECTED NOT DETECTED Final   Coronavirus 229E NOT DETECTED NOT DETECTED Final    Comment: (NOTE) The Coronavirus on the Respiratory Panel, DOES NOT test for the novel  Coronavirus (2019 nCoV)    Coronavirus HKU1 NOT DETECTED NOT DETECTED Final   Coronavirus NL63 NOT DETECTED NOT DETECTED Final   Coronavirus OC43 NOT DETECTED NOT DETECTED Final   Metapneumovirus NOT DETECTED NOT DETECTED Final   Rhinovirus / Enterovirus NOT DETECTED NOT DETECTED Final   Influenza A NOT DETECTED NOT DETECTED Final   Influenza B NOT DETECTED NOT DETECTED Final   Parainfluenza Virus 1 NOT DETECTED NOT DETECTED Final   Parainfluenza Virus 2 NOT DETECTED NOT DETECTED Final   Parainfluenza Virus 3 NOT DETECTED NOT DETECTED Final   Parainfluenza Virus 4 NOT DETECTED NOT DETECTED Final   Respiratory Syncytial Virus NOT DETECTED NOT DETECTED Final   Bordetella pertussis NOT DETECTED NOT DETECTED Final   Bordetella Parapertussis NOT DETECTED NOT DETECTED Final   Chlamydophila pneumoniae NOT DETECTED NOT DETECTED Final   Mycoplasma pneumoniae NOT DETECTED NOT DETECTED Final    Comment: Performed at [facility], [place]. [address], [place], [place] [address]    Studies/Results: CT ABDOMEN PELVIS WO CONTRAST  Result Date: 10/27/2020 CLINICAL DATA:  81 year old female with fever of unknown origin. EXAM: CT ABDOMEN  AND PELVIS WITHOUT CONTRAST TECHNIQUE: Multidetector CT imaging of the abdomen and pelvis was performed following the standard protocol without IV contrast. COMPARISON:  CT abdomen pelvis dated 10/16/2014. FINDINGS: Evaluation of this exam is limited in the absence of intravenous contrast. Lower chest: There are bibasilar linear atelectasis/scarring. There is coronary vascular calcification. No intra-abdominal free air or free fluid. Hepatobiliary:  The liver is unremarkable. No intrahepatic biliary ductal dilatation. There is layering sludge or small stones within the gallbladder. No pericholecystic fluid or evidence of acute cholecystitis by CT. Pancreas: Unremarkable. No pancreatic ductal dilatation or surrounding inflammatory changes. Spleen: Normal in size without focal abnormality. Adrenals/Urinary Tract: The adrenal glands are unremarkable. Atrophic left kidney. There is no hydronephrosis or nephrolithiasis on the right. The visualized ureters and urinary bladder appear unremarkable. Stomach/Bowel: There is moderate stool throughout the colon. Small scattered colonic diverticula without active inflammatory changes. There is no bowel obstruction or active inflammation. Appendectomy. Vascular/Lymphatic: Advanced aortoiliac atherosclerotic disease. The IVC is unremarkable. No portal venous gas. There is no adenopathy. Reproductive: Hysterectomy. No adnexal masses. Other: Postsurgical changes of anterior pelvic wall, likely related to prior hernia repair. There is a small fat containing umbilical hernia. No bowel herniation, fluid collection, or inflammatory changes. Focal area of induration of the subcutaneous soft tissues of the left anterior pelvic wall, likely related to subcutaneous injection. Musculoskeletal: Degenerative changes of the spine. No acute osseous pathology. IMPRESSION: 1. No acute intra-abdominal or pelvic pathology. 2. Moderate colonic stool burden. No bowel obstruction. 3. Aortic Atherosclerosis (ICD10-I70.0). Electronically Signed   By: Anner Crete M.D.   On: 10/27/2020 20:19      Assessment/Plan:  INTERVAL HISTORY: started on daptomycin   Principal Problem:   MRSA bacteremia Active Problems:   Anxiety   Mixed hyperlipidemia   GERD (gastroesophageal reflux disease)   Depression   Dehydration   Stasis dermatitis of both legs   Generalized weakness   AKI (acute kidney injury) ([place])   Essential hypertension    Acute kidney injury superimposed on chronic kidney disease (HCC)   Shortness of breath   Elevated d-dimer   Failure to thrive in adult   Obesity (BMI 30.0-34.9)    [name] [name] is a 80 y.o. female with who was treated for pneumonia in March and then readmitted with malaise and fatigue and fevers now found to have MRSA bacteremia.  Her transthoracic echocardiogram did not show any evidence of endocarditis.  Has multiple wounds on her lower extremities where she has had venous stasis changes which certainly could be a portal.  There is however no clear-cut source.  She does not have any symptoms to suggest deep infection.  #1 MRSA bacteremia: Given that we do not know the source this is going to be in the "complicated" grave MRSA bacteremia meaning she will need least 4 weeks of antibiotics.  I do think she should have her heart valves evaluated more aggressively and I have reached out to [facility] with cardiology and the patient should be able to get a transesophageal echocardiogram on Monday.  We will repeat blood cultures today though we may need to repeat them again tomorrow since she is just barely been on daptomycin.  LE wounds: Would consult wound care that she has had trouble with these areas bleeding.  I spent greater than 35 minutes with the patient including greater than 50% of time in face to face counsel of the patient regarding the nature of MRSA infections their work-up and management and in coordination  of her care with cardiology.  My partner Dr. Juleen China will follow up her cultures over the weekend and is available for questions.  Dr Gale Journey or Dr West Bali will see her on Monday.   LOS: 6 days   Alcide Evener 10/29/2020, 12:30 PM

## 2020-10-29 NOTE — Progress Notes (Signed)
Received in report that pt. Required In and out cath x2 during night. Bladder scan this morning 365ml. Md made aware of need of In and out cath. New  Order for foley cath. Foley cath placed. Tolerated well. 700ML of urine after foley placed.

## 2020-10-29 NOTE — H&P (Addendum)
PROGRESS NOTE    [name] [name]  MBE:[id number] DOB: 07-Mar-1941 DOA: 10/23/2020 PCP: [name], MD   Brief Narrative:  [name] [name] is a 80 y.o. female with medical history significant for CKD 3B, congenital solitary kidney hypertension, hyperlipidemia and peripheral vascular disease who presented to the ED due to generalized weakness and shortness of breath.   she was admitted to this hospital in February due to pneumonia and was then discharged to a skilled nursing facility, she was discharged from the nursing facility about 1 month ago.  Patient complained of cough and shortness of breath when she lays flat and that she occasionally coughs up a blackish sputum.  Patient states that she has not been eating and drinking enough over the past few weeks and that her blood pressure usually  increase while shortness of breath worsens when she stands.  Patient complained of rashes on both legs which appears to be dried blister wounds and bilateral erythematous rash of similar pattern (chronic venous stasis dermatitis.  Patient also complained of mild epigastric pain which worsens when she eats.  She lives alone, but daughter lives about 3 houses from her and was always around to assist patient.  Recent hospitalization: 09/01/2020 to 09/10/2020 Sepsis with acute hypoxic respiratory failure due to pneumonia and altered mental status. She was treated with 5 days of antibiotics with MRSA coverage and was discharged to a SNF  In the emergency department, she was hemodynamically stable.  Work-up done in the ED showed elevated MCV (100.8), BUN/creatinine 16/1.52 (baseline creatinine at 0.9-1.3), D-dimer 1.04, troponin x2 was flat at 7, BNP 18.8, lactic acid 0.8 Chest x-ray showed resolution of previously seen right basilar pneumonia.  She was admitted to hospital service.  Due to elevated creatinine,CT angiogram of the chest was not done and VQ scan was ordered however patient could not complete that test either.   Doppler lower extremity ruled out DVT. Subsequently on 10/25/2020, patient complained of pleuritic chest pain.  She underwent CT angiogram of chest and PE was ruled out.  Patient then improved clinically however she started having low-grade fever up to 100.5 200.7.  Her initial blood cultures at the time of admission remain negative.  UA was unremarkable.  CT chest negative for any pneumonia.  Procalcitonin as well as ESR and CRP were all elevated indicating possible brewing infection.  Since there was no source so CT abdomen and pelvis was done which was also negative for acute pathology.  Repeat blood cultures were done on 10/27/2020 and one of the 2 bottles was growing MRSA.  ID consulted.  Started on daptomycin.  Assessment & Plan:   Active Problems:   Anxiety   Mixed hyperlipidemia   GERD (gastroesophageal reflux disease)   Depression   Dehydration   Stasis dermatitis of both legs   Generalized weakness   AKI (acute kidney injury) ([place])   Essential hypertension   Acute kidney injury superimposed on chronic kidney disease (HCC)   Shortness of breath   Elevated d-dimer   Failure to thrive in adult   Obesity (BMI 30.0-34.9)   Acute hypoxic respiratory failure: Elevated D-dimer.  She could not complete/lay flat for 20-minute for VQ scan to be completed.  Doppler lower extremity ruled out DVT so anticoagulation was stopped.  On 10/25/2020, she complained of pleuritic chest pain today and was having problem taking deep breaths.  CT angiogram of the chest was done and she was ruled out of PE.  Discontinued full dose anticoagulation.  CT chest ruled out pneumonia or pulmonary edema as well.  Tested negative for COVID or respiratory viral panel.  She denies shortness of breath and remains on room air.  Low-grade fever/occult infection/MRSA bacteremia: Patient has been having low-grade fever up to 200.5 and 100.7.  No clear source of infection.  Patient does not complain of any specific complaint.   She just feels generalized weak and body aches.  ESR and CRP significantly elevated and has elevated procalcitonin as well.  This indicates possible occult infection.  Blood culture at the time of admission has remained negative.  CT abdomen and pelvis negative for acute pathology either.  She has not had any fever last 24 hours now.  One of the 2 sets from repeat blood culture from 10/27/2020 is growing MRSA.  ID auto consulted.  Patient started on daptomycin.  She has scabs in bilateral lower extremities and stasis dermatitis.  Does not have any signs of acute cellulitis or erythema.  However after talking to her daughter, I learned that patient has tendency to pick her scabs.  This could very well be the source of bacteremia.  Repeating blood cultures today.  ID had recommended transthoracic echo but I do not see any order placed.  However patient had transthoracic echo done just 4 days ago.  Will defer to ID if they would like to pursue TEE.  Waiting for further recommendations.  Epistaxis: Patient did spit some blood yesterday morning.  On examination, her nostrils were erythematous.  She clearly said that she had swallowed her own blood from the nose.  No obvious source of bleeding from the mouth.  No more epistaxis since yesterday.  Generalized weakness in the setting of failure to thrive in adult: Patient has had poor and oral intake since returning from SNF.  PT OT on board.  They recommend SNF.  Initially declined by insurance but I was informed today by [facility] that she may have a chance to go to SNF.  I will highly advocate for this patient to go to SNF as she is too weak to be sent home.  PMR: Patient's chart has a diagnosis documentation of PMR.  When discussed with the patient, she is not aware of the diagnosis.  She has not heard of that.  Also when I spoke to her daughter today, she is not aware of that either.  Patient's ESR and CRP being significantly elevated could very well be due to this.   She denied any headache and she does not have any tenderness on the temporal area ruling out giant cell arteritis.  I think her generalized body aches and pains are likely secondary to this and she might benefit from steroids.  Will discuss with ID if it would be okay with them.  Hypomagnesemia: Resolved.  Hypokalemia: Resolved.  Urinary retention: Patient had urinary retention initially requiring straight cath followed by indwelling Foley catheter.  Foley catheter was removed 3 days ago.  Patient was doing fine up until last night when she started having retention again and required straight cath x2.  Will place Foley catheter if retention continues.  Acute kidney injury superimposed on CKD stage IIIa/dehydration: Baseline creatinine around 1.06 with GFR of 53.  Presented with creatinine of 1.52 with GFR of 34.  Likely due to dehydration.  Improved and seems to be back to her baseline.  Stasis dermatitis of both legs with multiple scabs.  Patient states that she developed all those during her recent hospitalization.  There is  stable so continue wound care.  Right lower lobe nodule: 7 mm.  Radiology recommends noncontrast chest CT at 6 to 3-month  2 cm left thyroid nodule: Outpatient ultrasound recommended.  Essential hypertension: Controlled Continue amlodipine  Macrocytic anemia Continue vitamin B12  GERD Continue Protonix  Depression and anxiety Continue buspirone and Remeron  Obesity(BMI 30.61):  counseled on diet and nursing medication  DVT prophylaxis: enoxaparin (LOVENOX) injection 30 mg Start: 10/27/20 0800 SCDs Start: 10/23/20 1917   Code Status: DNR  Family Communication: None at bedside.  Discussed plan of care with the daughter yesterday.  Status is: Inpatient  Remains inpatient appropriate because:Ongoing diagnostic testing needed not appropriate for outpatient work up   Dispo: The patient is from: Home              Anticipated d/c is to: SNF               Patient currently is not medically stable to d/c.   Difficult to place patient No        Estimated body mass index is 30.61 kg/m as calculated from the following:   Height as of 09/01/20: 5' (1.524 m).   Weight as of this encounter: 71.1 kg.      Nutritional status:               Consultants:   None  Procedures:   None  Antimicrobials:  Anti-infectives (From admission, onward)   Start     Dose/Rate Route Frequency Ordered Stop   10/28/20 1030  DAPTOmycin (CUBICIN) 500 mg in sodium chloride 0.9 % IVPB        500 mg 120 mL/hr over 30 Minutes Intravenous Daily 10/28/20 0930           Subjective: Patient seen and examined.  Again today she states that she feels "rotten" and complains of body ache.  No other specific complaint.  Objective: Vitals:   10/28/20 0800 10/28/20 1414 10/28/20 2042 10/29/20 0506  BP: 140/72 108/77 127/73 (!) 135/50  Pulse: 99 (!) 102 100 100  Resp: (!) 24 16 17 19   Temp: (!) 97 F (36.1 C) 98.5 F (36.9 C) 98.1 F (36.7 C) 98.9 F (37.2 C)  TempSrc:  Oral Oral Oral  SpO2: 97% 97% 98% 97%  Weight:        Intake/Output Summary (Last 24 hours) at 10/29/2020 1104 Last data filed at 10/29/2020 1000 Gross per 24 hour  Intake 840 ml  Output 2300 ml  Net -1460 ml   Filed Weights   10/23/20 2040  Weight: 71.1 kg    Examination: General exam: Lays still in the bed and appears in pain when she tries to move. Respiratory system: Diminished breath sounds due to poor inspiratory effort.  Cardiovascular system: S1 & S2 heard, RRR. No JVD, murmurs, rubs, gallops or clicks.  +1 pitting edema bilateral lower extremity. Gastrointestinal system: Abdomen is nondistended, soft and nontender. No organomegaly or masses felt. Normal bowel sounds heard. Central nervous system: Alert and oriented. No focal neurological deficits. Extremities: Symmetric 5 x 5 power. Skin: Multiple scabs and stasis dermatitis bilateral lower  extremities. Psychiatry: Judgement and insight appear normal. Mood & affect appropriate.   Data Reviewed: I have personally reviewed following labs and imaging studies  CBC: Recent Labs  Lab 10/25/20 0353 10/26/20 0253 10/27/20 0408 10/28/20 0139 10/29/20 0722  WBC 7.7 6.9 7.2 6.9 6.4  NEUTROABS 4.9 4.1 3.9 4.1 3.6  HGB 11.7* 10.7* 11.1* 10.4* 10.6*  HCT 34.9*  33.0* 33.4* 31.1* 31.8*  MCV 101.7* 102.2* 101.2* 100.0 99.4  PLT 180 176 196 218 327   Basic Metabolic Panel: Recent Labs  Lab 10/24/20 0502 10/25/20 0353 10/26/20 0253 10/27/20 0408 10/28/20 0139 10/29/20 0722  NA 138 139 135 136 134* 136  K 2.9* 4.2 4.2 4.0 4.2 3.6  CL 99 102 99 97* 94* 96*  CO2 30 29 30 28 31 31   GLUCOSE 79 101* 97 95 94 93  BUN 11 16 15 21 22 23   CREATININE 1.21* 1.23* 1.24* 1.23* 1.23* 1.08*  CALCIUM 9.0 9.4 9.2 9.1 8.8* 9.1  MG 1.5* 2.0 1.8  --  1.8  --   PHOS 3.3  --   --   --   --   --    GFR: Estimated Creatinine Clearance: 35.9 mL/min (A) (by C-G formula based on SCr of 1.08 mg/dL (H)). Liver Function Tests: Recent Labs  Lab 10/24/20 0502 10/25/20 0353 10/26/20 0253 10/27/20 0408 10/28/20 0139  AST 13* 12* 10* 20 32  ALT 7 8 7 8 11   ALKPHOS 63 56 53 49 51  BILITOT 1.5* 0.9 0.9 0.7 0.5  PROT 6.5 6.1* 5.8* 6.3* 6.0*  ALBUMIN 3.4* 2.9* 2.6* 2.7* 2.5*   No results for input(s): LIPASE, AMYLASE in the last 168 hours. No results for input(s): AMMONIA in the last 168 hours. Coagulation Profile: Recent Labs  Lab 10/24/20 0502  INR 1.1   Cardiac Enzymes: Recent Labs  Lab 10/29/20 0722  CKTOTAL 130   BNP (last 3 results) No results for input(s): PROBNP in the last 8760 hours. HbA1C: No results for input(s): HGBA1C in the last 72 hours. CBG: No results for input(s): GLUCAP in the last 168 hours. Lipid Profile: No results for input(s): CHOL, HDL, LDLCALC, TRIG, CHOLHDL, LDLDIRECT in the last 72 hours. Thyroid Function Tests: No results for input(s): TSH, T4TOTAL,  FREET4, T3FREE, THYROIDAB in the last 72 hours. Anemia Panel: No results for input(s): VITAMINB12, FOLATE, FERRITIN, TIBC, IRON, RETICCTPCT in the last 72 hours. Sepsis Labs: Recent Labs  Lab 10/23/20 1426 10/23/20 1607 10/27/20 0833  PROCALCITON  --   --  1.26  LATICACIDVEN 1.1 0.8  --     Recent Results (from the past 240 hour(s))  Blood culture (routine x 2)     Status: None   Collection Time: 10/23/20  2:10 PM   Specimen: BLOOD  Result Value Ref Range Status   Specimen Description BLOOD SITE NOT SPECIFIED  Final   Special Requests   Final    BOTTLES DRAWN AEROBIC AND ANAEROBIC Blood Culture adequate volume   Culture   Final    NO GROWTH 5 DAYS Performed at [facility], [address], [place], [place] [address]    Report Status 10/28/2020 FINAL  Final  Blood culture (routine x 2)     Status: None   Collection Time: 10/23/20  2:26 PM   Specimen: BLOOD  Result Value Ref Range Status   Specimen Description BLOOD SITE NOT SPECIFIED  Final   Special Requests   Final    BOTTLES DRAWN AEROBIC AND ANAEROBIC Blood Culture adequate volume   Culture   Final    NO GROWTH 5 DAYS Performed at [facility], [place] [address] [place], [place] [address]    Report Status 10/28/2020 FINAL  Final  Resp Panel by RT-PCR (Flu A&B, Covid) Nasopharyngeal Swab     Status: None   Collection Time: 10/27/20 10:54 AM   Specimen:  Nasopharyngeal Swab; Nasopharyngeal(NP) swabs in vial transport medium  Result Value Ref Range Status   SARS Coronavirus 2 by RT PCR NEGATIVE NEGATIVE Final    Comment: (NOTE) SARS-CoV-2 target nucleic acids are NOT DETECTED.  The SARS-CoV-2 RNA is generally detectable in upper respiratory specimens during the acute phase of infection. The lowest concentration of SARS-CoV-2 viral copies this assay can detect is 138 copies/mL. A negative result does not preclude SARS-Cov-2 infection and should not be used as the sole basis for treatment or other patient  management decisions. A negative result may occur with  improper specimen collection/handling, submission of specimen other than nasopharyngeal swab, presence of viral mutation(s) within the areas targeted by this assay, and inadequate number of viral copies(<138 copies/mL). A negative result must be combined with clinical observations, patient history, and epidemiological information. The expected result is Negative.  Fact Sheet for Patients:  EntrepreneurPulse.com.au  Fact Sheet for Healthcare Providers:  IncredibleEmployment.be  This test is no t yet approved or cleared by the [place] FDA and  has been authorized for detection and/or diagnosis of SARS-CoV-2 by FDA under an Emergency Use Authorization (EUA). This EUA will remain  in effect (meaning this test can be used) for the duration of the COVID-19 declaration under Section 564(b)(1) of the Act, 21 U.S.C.section 360bbb-3(b)(1), unless the authorization is terminated  or revoked sooner.       Influenza A by PCR NEGATIVE NEGATIVE Final   Influenza B by PCR NEGATIVE NEGATIVE Final    Comment: (NOTE) The Xpert Xpress SARS-CoV-2/FLU/RSV plus assay is intended as an aid in the diagnosis of influenza from Nasopharyngeal swab specimens and should not be used as a sole basis for treatment. Nasal washings and aspirates are unacceptable for Xpert Xpress SARS-CoV-2/FLU/RSV testing.  Fact Sheet for Patients: EntrepreneurPulse.com.au  Fact Sheet for Healthcare Providers: IncredibleEmployment.be  This test is not yet approved or cleared by the [place] FDA and has been authorized for detection and/or diagnosis of SARS-CoV-2 by FDA under an Emergency Use Authorization (EUA). This EUA will remain in effect (meaning this test can be used) for the duration of the COVID-19 declaration under Section 564(b)(1) of the Act, 21 U.S.C. section 360bbb-3(b)(1),  unless the authorization is terminated or revoked.  Performed at [facility], [place] [address], [place], [place] [address]   Culture, blood (routine x 2)     Status: None (Preliminary result)   Collection Time: 10/27/20  2:00 PM   Specimen: BLOOD  Result Value Ref Range Status   Specimen Description BLOOD LEFT ANTECUBITAL  Final   Special Requests   Final    BOTTLES DRAWN AEROBIC AND ANAEROBIC Blood Culture adequate volume   Culture   Final    NO GROWTH 2 DAYS Performed at [facility], [place] [address], [place], [place] [address]    Report Status PENDING  Incomplete  Culture, blood (routine x 2)     Status: Abnormal (Preliminary result)   Collection Time: 10/27/20  2:05 PM   Specimen: BLOOD  Result Value Ref Range Status   Specimen Description BLOOD RIGHT ANTECUBITAL  Final   Special Requests   Final    BOTTLES DRAWN AEROBIC AND ANAEROBIC Blood Culture adequate volume   Culture  Setup Time   Final    GRAM POSITIVE COCCI IN CLUSTERS AEROBIC BOTTLE ONLY CRITICAL RESULT CALLED TO, READ BACK BY AND VERIFIED WITH: PHARMD JEREMY FRENN AT 3893 ON 10/28/20 BY KJ    Culture (A)  Final  STAPHYLOCOCCUS AUREUS SUSCEPTIBILITIES TO FOLLOW Performed at [facility], [place] [address], [place], [place] [address]    Report Status PENDING  Incomplete  Blood Culture ID Panel (Reflexed)     Status: Abnormal   Collection Time: 10/27/20  2:05 PM  Result Value Ref Range Status   Enterococcus faecalis NOT DETECTED NOT DETECTED Final   Enterococcus Faecium NOT DETECTED NOT DETECTED Final   Listeria monocytogenes NOT DETECTED NOT DETECTED Final   Staphylococcus species DETECTED (A) NOT DETECTED Final    Comment: CRITICAL RESULT CALLED TO, READ BACK BY AND VERIFIED WITH: [place] ON 10/28/20    Staphylococcus aureus (BCID) DETECTED (A) NOT DETECTED Final    Comment: Methicillin (oxacillin)-resistant Staphylococcus aureus (MRSA). MRSA is predictably resistant  to beta-lactam antibiotics (except ceftaroline). Preferred therapy is vancomycin unless clinically contraindicated. Patient requires contact precautions if  hospitalized. CRITICAL RESULT CALLED TO, READ BACK BY AND VERIFIED WITH: [place] AT 0919 ON 10/28/20 BY KJ    Staphylococcus epidermidis NOT DETECTED NOT DETECTED Final   Staphylococcus lugdunensis NOT DETECTED NOT DETECTED Final   Streptococcus species NOT DETECTED NOT DETECTED Final   Streptococcus agalactiae NOT DETECTED NOT DETECTED Final   Streptococcus pneumoniae NOT DETECTED NOT DETECTED Final   Streptococcus pyogenes NOT DETECTED NOT DETECTED Final   A.calcoaceticus-baumannii NOT DETECTED NOT DETECTED Final   Bacteroides fragilis NOT DETECTED NOT DETECTED Final   Enterobacterales NOT DETECTED NOT DETECTED Final   Enterobacter cloacae complex NOT DETECTED NOT DETECTED Final   Escherichia coli NOT DETECTED NOT DETECTED Final   Klebsiella aerogenes NOT DETECTED NOT DETECTED Final   Klebsiella oxytoca NOT DETECTED NOT DETECTED Final   Klebsiella pneumoniae NOT DETECTED NOT DETECTED Final   Proteus species NOT DETECTED NOT DETECTED Final   Salmonella species NOT DETECTED NOT DETECTED Final   Serratia marcescens NOT DETECTED NOT DETECTED Final   Haemophilus influenzae NOT DETECTED NOT DETECTED Final   Neisseria meningitidis NOT DETECTED NOT DETECTED Final   Pseudomonas aeruginosa NOT DETECTED NOT DETECTED Final   Stenotrophomonas maltophilia NOT DETECTED NOT DETECTED Final   Candida albicans NOT DETECTED NOT DETECTED Final   Candida auris NOT DETECTED NOT DETECTED Final   Candida glabrata NOT DETECTED NOT DETECTED Final   Candida krusei NOT DETECTED NOT DETECTED Final   Candida parapsilosis NOT DETECTED NOT DETECTED Final   Candida tropicalis NOT DETECTED NOT DETECTED Final   Cryptococcus neoformans/gattii NOT DETECTED NOT DETECTED Final   Meth resistant mecA/C and MREJ DETECTED (A) NOT DETECTED Final    Comment:  CRITICAL RESULT CALLED TO, READ BACK BY AND VERIFIED WITH: PHARMD JEREMY FRENN AT 2831 ON 10/28/20 BY KJ Performed at [facility], [address] [place] [address] [place], [place] [address]   Respiratory (~20 pathogens) panel by PCR     Status: None   Collection Time: 10/27/20  5:00 PM   Specimen: Nasopharyngeal Swab; Respiratory  Result Value Ref Range Status   Adenovirus NOT DETECTED NOT DETECTED Final   Coronavirus 229E NOT DETECTED NOT DETECTED Final    Comment: (NOTE) The Coronavirus on the Respiratory Panel, DOES NOT test for the novel  Coronavirus (2019 nCoV)    Coronavirus HKU1 NOT DETECTED NOT DETECTED Final   Coronavirus NL63 NOT DETECTED NOT DETECTED Final   Coronavirus OC43 NOT DETECTED NOT DETECTED Final   Metapneumovirus NOT DETECTED NOT DETECTED Final   Rhinovirus / Enterovirus NOT DETECTED NOT DETECTED Final   Influenza A NOT DETECTED NOT  DETECTED Final   Influenza B NOT DETECTED NOT DETECTED Final   Parainfluenza Virus 1 NOT DETECTED NOT DETECTED Final   Parainfluenza Virus 2 NOT DETECTED NOT DETECTED Final   Parainfluenza Virus 3 NOT DETECTED NOT DETECTED Final   Parainfluenza Virus 4 NOT DETECTED NOT DETECTED Final   Respiratory Syncytial Virus NOT DETECTED NOT DETECTED Final   Bordetella pertussis NOT DETECTED NOT DETECTED Final   Bordetella Parapertussis NOT DETECTED NOT DETECTED Final   Chlamydophila pneumoniae NOT DETECTED NOT DETECTED Final   Mycoplasma pneumoniae NOT DETECTED NOT DETECTED Final    Comment: Performed at [facility], [place] [address] [place], [place] [address]      Radiology Studies: CT ABDOMEN PELVIS WO CONTRAST  Result Date: 10/27/2020 CLINICAL DATA:  80 year old female with fever of unknown origin. EXAM: CT ABDOMEN AND PELVIS WITHOUT CONTRAST TECHNIQUE: Multidetector CT imaging of the abdomen and pelvis was performed following the standard protocol without IV contrast. COMPARISON:  CT abdomen pelvis dated 10/16/2014. FINDINGS: Evaluation of  this exam is limited in the absence of intravenous contrast. Lower chest: There are bibasilar linear atelectasis/scarring. There is coronary vascular calcification. No intra-abdominal free air or free fluid. Hepatobiliary: The liver is unremarkable. No intrahepatic biliary ductal dilatation. There is layering sludge or small stones within the gallbladder. No pericholecystic fluid or evidence of acute cholecystitis by CT. Pancreas: Unremarkable. No pancreatic ductal dilatation or surrounding inflammatory changes. Spleen: Normal in size without focal abnormality. Adrenals/Urinary Tract: The adrenal glands are unremarkable. Atrophic left kidney. There is no hydronephrosis or nephrolithiasis on the right. The visualized ureters and urinary bladder appear unremarkable. Stomach/Bowel: There is moderate stool throughout the colon. Small scattered colonic diverticula without active inflammatory changes. There is no bowel obstruction or active inflammation. Appendectomy. Vascular/Lymphatic: Advanced aortoiliac atherosclerotic disease. The IVC is unremarkable. No portal venous gas. There is no adenopathy. Reproductive: Hysterectomy. No adnexal masses. Other: Postsurgical changes of anterior pelvic wall, likely related to prior hernia repair. There is a small fat containing umbilical hernia. No bowel herniation, fluid collection, or inflammatory changes. Focal area of induration of the subcutaneous soft tissues of the left anterior pelvic wall, likely related to subcutaneous injection. Musculoskeletal: Degenerative changes of the spine. No acute osseous pathology. IMPRESSION: 1. No acute intra-abdominal or pelvic pathology. 2. Moderate colonic stool burden. No bowel obstruction. 3. Aortic Atherosclerosis (ICD10-I70.0). Electronically Signed   By: Anner Crete M.D.   On: 10/27/2020 20:19    Scheduled Meds: . amLODipine  5 mg Oral Daily  . aspirin  81 mg Oral Daily  . busPIRone  15 mg Oral TID  . Chlorhexidine  Gluconate Cloth  6 each Topical Daily  . cholecalciferol  1,000 Units Oral Daily  . clopidogrel  75 mg Oral q morning  . divalproex  125 mg Oral BID  . dorzolamide  1 drop Both Eyes BID   And  . timolol  1 drop Both Eyes BID  . enoxaparin (LOVENOX) injection  30 mg Subcutaneous Q24H  . ferrous sulfate  325 mg Oral Daily  . furosemide  40 mg Intravenous Daily  . gabapentin  100 mg Oral TID  . latanoprost  1 drop Both Eyes QHS  . mirtazapine  15 mg Oral QHS  . pantoprazole  40 mg Oral Daily  . vitamin B-12  100 mcg Oral Daily   Continuous Infusions: . DAPTOmycin (CUBICIN)  IV 500 mg (10/28/20 1117)     LOS: 6 days   Time spent: 31 minutes  Darliss Cheney, MD [facility]  10/29/2020, 11:04 AM   To contact the attending provider between 7A-7P or the covering provider during after hours 7P-7A, please log into the web site [email or web address].

## 2020-10-29 NOTE — Progress Notes (Signed)
    [facility] has been requested to perform a transesophageal echocardiogram on this patient for bacteremia. After careful review of history and examination, the risks and benefits of transesophageal echocardiogram have been explained including risks of esophageal damage, perforation (1:10,000 risk), bleeding, pharyngeal hematoma as well as other potential complications associated with conscious sedation including aspiration, arrhythmia, respiratory failure and death. Alternatives to treatment were discussed, questions were answered. She is A+Ox3 and asks good questions. Patient is willing to proceed. This is scheduled for Monday at 8 with Dr. Radford Pax. Orders written including NPO after midnight into Monday.  Charlie Pitter, PA-C 10/29/2020 1:02 PM

## 2020-10-29 NOTE — Progress Notes (Signed)
Physical Therapy Treatment Patient Details Name: [name] [name] MRN: [id number] DOB: Feb 01, 1941 Today's Date: 10/29/2020    History of Present Illness 80 y/o female who was sent to ED on 10/23/20 after her home health nurse noticed hypoxia and an irregular HR. Pt admitted due to SOB with further work up pending. Pt with recent admission on 09/01/20 for sepsis secondary to PNA. PMH includes CKD, congenital solitary kideny, HTN, and HLD.   PT Comments    Pt was seen for mobility but declines to get OOB.  Tolerated there ex with assistance, and with avoidance of shearing on skin of LE's.  Pt has improved effort to move legs and will still be recommended to go to rehab and work on strength and healing of skin.  Follow acutely for goals of PT.   Follow Up Recommendations  SNF     Equipment Recommendations  None recommended by PT    Recommendations for Other Services       Precautions / Restrictions Precautions Precautions: Fall Precaution Comments: monitor O2 Restrictions Weight Bearing Restrictions: No    Mobility  Bed Mobility Overal bed mobility: Needs Assistance             General bed mobility comments: pt declined to get OOB    Transfers                    Ambulation/Gait                 Stairs             Wheelchair Mobility    Modified Rankin (Stroke Patients Only)       Balance Overall balance assessment: Needs assistance                                          Cognition Arousal/Alertness: Awake/alert Behavior During Therapy: WFL for tasks assessed/performed Overall Cognitive Status: No family/caregiver present to determine baseline cognitive functioning Area of Impairment: Memory                 Orientation Level: Time;Situation Current Attention Level: Selective                  Exercises General Exercises - Lower Extremity Ankle Circles/Pumps: AAROM;5 reps Quad Sets: AROM;10 reps Gluteal  Sets: AROM;10 reps Heel Slides: AROM;AAROM;10 reps Hip ABduction/ADduction: AROM;AAROM;10 reps Straight Leg Raises: AAROM;10 reps    General Comments General comments (skin integrity, edema, etc.): Pt has drier wounds on LE's today with less blood seeping onto bed.      Pertinent Vitals/Pain Pain Assessment: Faces Faces Pain Scale: Hurts little more Pain Location: LE wounds Pain Descriptors / Indicators: Grimacing;Tender    Home Living                      Prior Function            PT Goals (current goals can now be found in the care plan section) Acute Rehab PT Goals Patient Stated Goal: hurt less and go home Progress towards PT goals: Not progressing toward goals - comment    Frequency    Min 3X/week      PT Plan Current plan remains appropriate    Co-evaluation              AM-PAC PT "6 Clicks" Mobility   Outcome Measure  Help needed turning from your back to your side while in a flat bed without using bedrails?: A Lot Help needed moving from lying on your back to sitting on the side of a flat bed without using bedrails?: A Lot Help needed moving to and from a bed to a chair (including a wheelchair)?: A Lot Help needed standing up from a chair using your arms (e.g., wheelchair or bedside chair)?: A Little Help needed to walk in hospital room?: A Lot Help needed climbing 3-5 steps with a railing? : Total 6 Click Score: 12    End of Session   Activity Tolerance: Patient limited by fatigue Patient left: in bed;with call bell/phone within reach;with bed alarm set Nurse Communication: Mobility status PT Visit Diagnosis: Unsteadiness on feet (R26.81);Muscle weakness (generalized) (M62.81);Difficulty in walking, not elsewhere classified (R26.2)     Time: 4888-9169 PT Time Calculation (min) (ACUTE ONLY): 26 min  Charges:  $Therapeutic Exercise: 8-22 mins $Therapeutic Activity: 8-22 mins                    Ramond Dial 10/29/2020, 9:11  PM  Mee Hives, PT MS Acute Rehab Dept. Number: [place] [facility] [place]

## 2020-10-29 NOTE — Consult Note (Signed)
WOC Nurse Consult Note: Patient receiving care in [facility] 2W[facility]. I spoke with the patient's primary RN, Amrah Pugh via telephone. Reason for Consult: LE ulcers that have bled with certain bandages pt with MRSA bacteremia at present. Wound type: Per Amrah, the patient has multiple blisters (intact and non-intact) over BLE. She has her legs open to air presently. Pressure Injury POA: Yes/No/NA Measurement: Wound bed: Drainage (amount, consistency, odor)  Periwound: Dressing procedure/placement/frequency:  Gently wash BLE with soap and water. Pat dry. Apply Sween Moisturizing Ointment to intact skin. Place a Xeroform gauze Kellie Simmering [phone]) over each blister. Beginning behind the toes and going to just below the knees, spiral wrap kerlex. Perform daily. MOISTEN old dressing with saline prior to removing.  Once the infectious process is under control, we can consider adding an Ace Wrap over the kerlex. Thank you for the consult.  Discussed plan of care with the bedside nurse.  [place] nurse will not follow at this time.  Please re-consult the [place] team if needed.  Val Riles, RN, MSN, CWOCN, CNS-BC, pager [phone]

## 2020-10-30 DIAGNOSIS — B9562 Methicillin resistant Staphylococcus aureus infection as the cause of diseases classified elsewhere: Secondary | ICD-10-CM | POA: Diagnosis not present

## 2020-10-30 DIAGNOSIS — R7881 Bacteremia: Secondary | ICD-10-CM | POA: Diagnosis not present

## 2020-10-30 LAB — BASIC METABOLIC PANEL
Anion gap: 9 (ref 5–15)
BUN: 30 mg/dL — ABNORMAL HIGH (ref 8–23)
CO2: 29 mmol/L (ref 22–32)
Calcium: 8.7 mg/dL — ABNORMAL LOW (ref 8.9–10.3)
Chloride: 92 mmol/L — ABNORMAL LOW (ref 98–111)
Creatinine, Ser: 1.19 mg/dL — ABNORMAL HIGH (ref 0.44–1.00)
GFR, Estimated: 46 mL/min — ABNORMAL LOW (ref >60)
Glucose, Bld: 99 mg/dL (ref 70–99)
Potassium: 4 mmol/L (ref 3.5–5.1)
Sodium: 130 mmol/L — ABNORMAL LOW (ref 135–145)

## 2020-10-30 LAB — CBC WITH DIFFERENTIAL/PLATELET
Abs Immature Granulocytes: 0.02 10*3/uL (ref 0.00–0.07)
Basophils Absolute: 0 10*3/uL (ref 0.0–0.1)
Basophils Relative: 0 %
Eosinophils Absolute: 0.4 10*3/uL (ref 0.0–0.5)
Eosinophils Relative: 5 %
HCT: 28.4 % — ABNORMAL LOW (ref 36.0–46.0)
Hemoglobin: 9.7 g/dL — ABNORMAL LOW (ref 12.0–15.0)
Immature Granulocytes: 0 %
Lymphocytes Relative: 30 %
Lymphs Abs: 2.1 10*3/uL (ref 0.7–4.0)
MCH: 33.6 pg (ref 26.0–34.0)
MCHC: 34.2 g/dL (ref 30.0–36.0)
MCV: 98.3 fL (ref 80.0–100.0)
Monocytes Absolute: 0.8 10*3/uL (ref 0.1–1.0)
Monocytes Relative: 12 %
Neutro Abs: 3.8 10*3/uL (ref 1.7–7.7)
Neutrophils Relative %: 53 %
Platelets: 229 10*3/uL (ref 150–400)
RBC: 2.89 MIL/uL — ABNORMAL LOW (ref 3.87–5.11)
RDW: 14.5 % (ref 11.5–15.5)
WBC: 7.2 10*3/uL (ref 4.0–10.5)
nRBC: 0 % (ref 0.0–0.2)

## 2020-10-30 LAB — CULTURE, BLOOD (ROUTINE X 2): Special Requests: ADEQUATE

## 2020-10-30 NOTE — Plan of Care (Signed)
  Problem: Activity: Goal: Ability to tolerate increased activity will improve Outcome: Progressing   

## 2020-10-30 NOTE — Progress Notes (Signed)
PROGRESS NOTE    [name] [name]  MRN:[id number] DOB: 04/04/1941 DOA: 10/23/2020 PCP: Jones, Enrico, MD   Chief Complaint  Patient presents with  . Hypoxia  Brief Narrative: 80-year-old female with CKD stage IIIb, congenital solitary kidney hypertension hyperlipidemia PVD presented with generalized weakness, shortness of breath.  In the ED chest x-ray showed resolution of previously seen right basilar pneumonia, underwent further work-up with VQ scan but could not be completed and unable to get CT angio due to elevated creatinine, Doppler of the leg negative for DVT. Atenolol 1 further evaluation on 4/18 with CT angiography due to chest pain and PE was ruled out.  Patient still having low-grade fever, blood culture on admission were negative UA unremarkable no pneumonia on CT chest procalcitonin ESR and CRP were elevated concern for ongoing infection underwent CT abdomen pelvis no acute pathology found.  Repeat blood culture on 4/20 grew 1 out of 2 bottles with MRSA, ID was consulted.  Waiting for TEE on 4/25. RECENTY Hospitalization: 09/01/2020 to 09/10/2020 Sepsis with acute hypoxic respiratory failure due to pneumonia and altered mental status. She was treated with 5 days of antibiotics with MRSA coverage and was discharged to a SNF from IM teaching service  Subjective: Afebrile overnight Blood pressure stable Complains of feeling weak but no other new complaints.  On room air  Assessment & Plan:  MRSA bacteremia: She has had low-grade fever-that prompted further work-up and blood culture that was positive for 1 out of 2 MRSA. unknown source.  ID on board planning to get TEE on Monday-cardiology consulted.  Continue daptomycin for now.  Repeat blood culture pending from 4/22. Recent Labs  Lab 10/23/20 1426 10/23/20 1607 10/24/20 0502 10/26/20 0253 10/27/20 0408 10/27/20 0833 10/28/20 0139 10/29/20 0722 10/30/20 0107  WBC 9.3  --    < > 6.9 7.2  --  6.9 6.4 7.2  LATICACIDVEN 1.1 0.8  --    --   --   --   --   --   --   PROCALCITON  --   --   --   --   --  1.26  --   --   --    < > = values in this interval not displayed.   Acute hypoxic respiratory failure/elevated D-dimer: Patient has gone on further work-up no PE on CT angio, no DVT on Doppler.  Negative for COVID and respiratory virus panel.  At this time resolved and on room air  LLE wounds/stasis dermatitis of lower extremities: wound care is on consult.  Epistaxis, transient.  No recurrence  Hyponatremia mild at 130 encourage oral hydration  AKI on CKD stage IIIb, baseline creatinine~1.0, creatinine appears stable and improving Recent Labs  Lab 10/26/20 0253 10/27/20 0408 10/28/20 0139 10/29/20 0722 10/30/20 0107  BUN 15 21 22 23 30*  CREATININE 1.24* 1.23* 1.23* 1.08* 1.19*   Urine retention needing straight cath followed with indwelling Foley catheter has been removed and voiding well until 2 nights ago when she had retention needing straight cath x2-Foley was placed  4/22- VOIDING TRAIL in 1-2 days, start flomax.  Microcytic anemia likely in the setting of CKD/chronic disease remains a stable 9 to 10 g.  Continue iron and B12 supplementation  Generalized weakness/failure to thrive: In the setting of multiple comorbidities, active infection.  Continue PT OT social worker evaluation and has advised skilled nursing facility on discharge.  PMR: Has a diagnosis of documented PMR, RA. apparently patient not aware of diagnosis and   daughter also not tolerated.  ESR CRP ANA RF positive.  Patient without headache, tenderness on temporal area giant cell arteritis not suspected.  Having generalized body aches and pain, was discussed with ID regarding steroids and currently holding pending further infectious work-up.  Hypomagnesemia/hypokalemia: Resolved  Right lower lobe nodule 7 mm radiology advises noncontrast CT chest in 6 to 12 months 2 cm left thyroid nodule outpatient ultrasound of the thyroid will be  needed-follow-up with PCP  Essential hypertension controlled on amlodipine  PVD history of iliac stent angioplasty: On aspirin, Plavix.  GERD continue PPI  Anxiety/depression: Continue BuSpar . depakote and Remeron  Lumbar radiculopathy continue pain meds  Morbid obesity BMI 30.6 will benefit with weight loss outpatient PCP follow-up  Diet Order            Diet NPO time specified Except for: Sips with Meds  Diet effective ____           Diet Heart Room service appropriate? Yes; Fluid consistency: Thin  Diet effective now               Patient's Body mass index is 30.61 kg/m.  DVT prophylaxis: enoxaparin (LOVENOX) injection 30 mg Start: 10/27/20 0800 SCDs Start: 10/23/20 1917 Code Status:   Code Status: DNR  Family Communication: plan of care discussed with patient at bedside. Reports her daughter visited yesterday and aware about the plan. Point of contact-grandson  Status is: Inpatient Remains inpatient appropriate because:Ongoing active pain requiring inpatient pain management and Inpatient level of care appropriate due to severity of illness  Dispo: The patient is from: Home, with home health nurse              Anticipated d/c is to: SNF once work-up completed, ~3 days              Patient currently is not medically stable to d/c.   Difficult to place patient No    Unresulted Labs (From admission, onward)          Start     Ordered   10/29/20 1400  Culture, blood (Routine X 2) w Reflex to ID Panel  BLOOD CULTURE X 2,   R (with TIMED occurrences)      10/29/20 0916   10/29/20 0500  CK  Weekly,   R     Question:  Specimen collection method  Answer:  Lab=Lab collect   10/28/20 1515   10/26/20 0500  CBC with Differential/Platelet  Daily,   R     Question:  Specimen collection method  Answer:  Lab=Lab collect   10/25/20 1225          Medications reviewed:  Scheduled Meds: . amLODipine  5 mg Oral Daily  . aspirin  81 mg Oral Daily  . busPIRone  15 mg Oral  TID  . Chlorhexidine Gluconate Cloth  6 each Topical Daily  . cholecalciferol  1,000 Units Oral Daily  . clopidogrel  75 mg Oral q morning  . divalproex  125 mg Oral BID  . dorzolamide  1 drop Both Eyes BID   And  . timolol  1 drop Both Eyes BID  . enoxaparin (LOVENOX) injection  30 mg Subcutaneous Q24H  . ferrous sulfate  325 mg Oral Daily  . furosemide  40 mg Intravenous Daily  . gabapentin  100 mg Oral TID  . latanoprost  1 drop Both Eyes QHS  . mirtazapine  15 mg Oral QHS  . pantoprazole  40 mg Oral Daily  .   polyethylene glycol  17 g Oral Daily  . vitamin B-12  100 mcg Oral Daily   Continuous Infusions: . DAPTOmycin (CUBICIN)  IV 500 mg (10/29/20 2102)    Consultants: ID Cardiology   Procedures:see note  Antimicrobials: Anti-infectives (From admission, onward)   Start     Dose/Rate Route Frequency Ordered Stop   10/28/20 1030  DAPTOmycin (CUBICIN) 500 mg in sodium chloride 0.9 % IVPB        500 mg 120 mL/hr over 30 Minutes Intravenous Daily 10/28/20 0930       Culture/Microbiology    Component Value Date/Time   SDES BLOOD RIGHT ANTECUBITAL 10/27/2020 1405   SPECREQUEST  10/27/2020 1405    BOTTLES DRAWN AEROBIC AND ANAEROBIC Blood Culture adequate volume   CULT (A) 10/27/2020 1405    STAPHYLOCOCCUS AUREUS SUSCEPTIBILITIES TO FOLLOW Performed at [facility], [address] [place], [place] [address]    REPTSTATUS PENDING 10/27/2020 1405    Other culture-see note  Objective: Vitals: Today's Vitals   10/29/20 1731 10/29/20 2007 10/29/20 2137 10/30/20 0542  BP:   110/76 135/60  Pulse:   100 97  Resp:   17 19  Temp:   99.1 F (37.3 C) 99.5 F (37.5 C)  TempSrc:   Oral Oral  SpO2:   99% 97%  Weight:      PainSc: 7  0-No pain      Intake/Output Summary (Last 24 hours) at 10/30/2020 0734 Last data filed at 10/30/2020 0600 Gross per 24 hour  Intake 1440 ml  Output 1400 ml  Net 40 ml   Filed Weights   10/23/20 2040  Weight: 71.1 kg   Weight  change:   Intake/Output from previous day: 04/22 0701 - 04/23 0700 In: 1440 [P.O.:1440] Out: 1400 [Urine:1400] Intake/Output this shift: No intake/output data recorded. Filed Weights   10/23/20 2040  Weight: 71.1 kg    Examination: General exam: AAOxx3, weak,NAD, weak appearing. HEENT:Oral mucosa moist, Ear/Nose WNL grossly,dentition normal. Respiratory system: bilaterally diminished,no use of accessory muscle, non tender. Cardiovascular system: S1 & S2 +, regular, No JVD. Gastrointestinal system: Abdomen soft, NT,ND, BS+. Nervous System:Alert, awake, moving extremities and grossly nonfocal Extremities: No edema, distal peripheral pulses palpable.  Skin: No rashes,no icterus. MSK: Normal muscle bulk,tone, power   Data Reviewed: I have personally reviewed following labs and imaging studies CBC: Recent Labs  Lab 10/26/20 0253 10/27/20 0408 10/28/20 0139 10/29/20 0722 10/30/20 0107  WBC 6.9 7.2 6.9 6.4 7.2  NEUTROABS 4.1 3.9 4.1 3.6 3.8  HGB 10.7* 11.1* 10.4* 10.6* 9.7*  HCT 33.0* 33.4* 31.1* 31.8* 28.4*  MCV 102.2* 101.2* 100.0 99.4 98.3  PLT 176 196 218 221 229   Basic Metabolic Panel: Recent Labs  Lab 10/24/20 0502 10/25/20 0353 10/26/20 0253 10/27/20 0408 10/28/20 0139 10/29/20 0722 10/30/20 0107  NA 138 139 135 136 134* 136 130*  K 2.9* 4.2 4.2 4.0 4.2 3.6 4.0  CL 99 102 99 97* 94* 96* 92*  CO2 30 29 30 28 31 31 29  GLUCOSE 79 101* 97 95 94 93 99  BUN 11 16 15 21 22 23 30*  CREATININE 1.21* 1.23* 1.24* 1.23* 1.23* 1.08* 1.19*  CALCIUM 9.0 9.4 9.2 9.1 8.8* 9.1 8.7*  MG 1.5* 2.0 1.8  --  1.8  --   --   PHOS 3.3  --   --   --   --   --   --    GFR: Estimated Creatinine Clearance: 32.6 mL/min (  A) (by C-G formula based on SCr of 1.19 mg/dL (H)). Liver Function Tests: Recent Labs  Lab 10/24/20 0502 10/25/20 0353 10/26/20 0253 10/27/20 0408 10/28/20 0139  AST 13* 12* 10* 20 32  ALT _0 ALKPHOS 63 56 53 49 51  BILITOT 1.5* 0.9 0.9 0.7 0.5   PROT 6.5 6.1* 5.8* 6.3* 6.0*  ALBUMIN 3.4* 2.9* 2.6* 2.7* 2.5*   No results for input(s): LIPASE, AMYLASE in the last 168 hours. No results for input(s): AMMONIA in the last 168 hours. Coagulation Profile: Recent Labs  Lab 10/24/20 0502  INR 1.1   Cardiac Enzymes: Recent Labs  Lab 10/29/20 0722  CKTOTAL 130   BNP (last 3 results) No results for input(s): PROBNP in the last 8760 hours. HbA1C: No results for input(s): HGBA1C in the last 72 hours. CBG: No results for input(s): GLUCAP in the last 168 hours. Lipid Profile: No results for input(s): CHOL, HDL, LDLCALC, TRIG, CHOLHDL, LDLDIRECT in the last 72 hours. Thyroid Function Tests: No results for input(s): TSH, T4TOTAL, FREET4, T3FREE, THYROIDAB in the last 72 hours. Anemia Panel: No results for input(s): VITAMINB12, FOLATE, FERRITIN, TIBC, IRON, RETICCTPCT in the last 72 hours. Sepsis Labs: Recent Labs  Lab 10/23/20 1426 10/23/20 1607 10/27/20 0833  PROCALCITON  --   --  1.26  LATICACIDVEN 1.1 0.8  --     Recent Results (from the past 240 hour(s))  Blood culture (routine x 2)     Status: None   Collection Time: 10/23/20  2:10 PM   Specimen: BLOOD  Result Value Ref Range Status   Specimen Description BLOOD SITE NOT SPECIFIED  Final   Special Requests   Final    BOTTLES DRAWN AEROBIC AND ANAEROBIC Blood Culture adequate volume   Culture   Final    NO GROWTH 5 DAYS Performed at [facility], [address], [place], [place] [address]    Report Status 10/28/2020 FINAL  Final  Blood culture (routine x 2)     Status: None   Collection Time: 10/23/20  2:26 PM   Specimen: BLOOD  Result Value Ref Range Status   Specimen Description BLOOD SITE NOT SPECIFIED  Final   Special Requests   Final    BOTTLES DRAWN AEROBIC AND ANAEROBIC Blood Culture adequate volume   Culture   Final    NO GROWTH 5 DAYS Performed at [facility], [place] [address], [place], [place] [address]    Report Status 10/28/2020 FINAL   Final  Resp Panel by RT-PCR (Flu A&B, Covid) Nasopharyngeal Swab     Status: None   Collection Time: 10/27/20 10:54 AM   Specimen: Nasopharyngeal Swab; Nasopharyngeal(NP) swabs in vial transport medium  Result Value Ref Range Status   SARS Coronavirus 2 by RT PCR NEGATIVE NEGATIVE Final    Comment: (NOTE) SARS-CoV-2 target nucleic acids are NOT DETECTED.  The SARS-CoV-2 RNA is generally detectable in upper respiratory specimens during the acute phase of infection. The lowest concentration of SARS-CoV-2 viral copies this assay can detect is 138 copies/mL. A negative result does not preclude SARS-Cov-2 infection and should not be used as the sole basis for treatment or other patient management decisions. A negative result may occur with  improper specimen collection/handling, submission of specimen other than nasopharyngeal swab, presence of viral mutation(s) within the areas targeted by this assay, and inadequate number of viral copies(<138 copies/mL). A negative result must be combined with clinical observations, patient history, and epidemiological  information. The expected result is Negative.  Fact Sheet for Patients:  EntrepreneurPulse.com.au  Fact Sheet for Healthcare Providers:  IncredibleEmployment.be  This test is no t yet approved or cleared by the [place] FDA and  has been authorized for detection and/or diagnosis of SARS-CoV-2 by FDA under an Emergency Use Authorization (EUA). This EUA will remain  in effect (meaning this test can be used) for the duration of the COVID-19 declaration under Section 564(b)(1) of the Act, 21 U.S.C.section 360bbb-3(b)(1), unless the authorization is terminated  or revoked sooner.       Influenza A by PCR NEGATIVE NEGATIVE Final   Influenza B by PCR NEGATIVE NEGATIVE Final    Comment: (NOTE) The Xpert Xpress SARS-CoV-2/FLU/RSV plus assay is intended as an aid in the diagnosis of influenza from  Nasopharyngeal swab specimens and should not be used as a sole basis for treatment. Nasal washings and aspirates are unacceptable for Xpert Xpress SARS-CoV-2/FLU/RSV testing.  Fact Sheet for Patients: EntrepreneurPulse.com.au  Fact Sheet for Healthcare Providers: IncredibleEmployment.be  This test is not yet approved or cleared by the [place] FDA and has been authorized for detection and/or diagnosis of SARS-CoV-2 by FDA under an Emergency Use Authorization (EUA). This EUA will remain in effect (meaning this test can be used) for the duration of the COVID-19 declaration under Section 564(b)(1) of the Act, 21 U.S.C. section 360bbb-3(b)(1), unless the authorization is terminated or revoked.  Performed at [facility], [place] [address], [place], [place] [address]   Culture, blood (routine x 2)     Status: None (Preliminary result)   Collection Time: 10/27/20  2:00 PM   Specimen: BLOOD  Result Value Ref Range Status   Specimen Description BLOOD LEFT ANTECUBITAL  Final   Special Requests   Final    BOTTLES DRAWN AEROBIC AND ANAEROBIC Blood Culture adequate volume   Culture   Final    NO GROWTH 2 DAYS Performed at [facility], [place] [address] [place], [place] [address]    Report Status PENDING  Incomplete  Culture, blood (routine x 2)     Status: Abnormal (Preliminary result)   Collection Time: 10/27/20  2:05 PM   Specimen: BLOOD  Result Value Ref Range Status   Specimen Description BLOOD RIGHT ANTECUBITAL  Final   Special Requests   Final    BOTTLES DRAWN AEROBIC AND ANAEROBIC Blood Culture adequate volume   Culture  Setup Time   Final    GRAM POSITIVE COCCI IN CLUSTERS AEROBIC BOTTLE ONLY CRITICAL RESULT CALLED TO, READ BACK BY AND VERIFIED WITH: PHARMD JEREMY FRENN AT 5176 ON 10/28/20 BY KJ    Culture (A)  Final    STAPHYLOCOCCUS AUREUS SUSCEPTIBILITIES TO FOLLOW Performed at [facility], [place] [address],  [place],  [address]    Report Status PENDING  Incomplete  Blood Culture ID Panel (Reflexed)     Status: Abnormal   Collection Time: 10/27/20  2:05 PM  Result Value Ref Range Status   Enterococcus faecalis NOT DETECTED NOT DETECTED Final   Enterococcus Faecium NOT DETECTED NOT DETECTED Final   Listeria monocytogenes NOT DETECTED NOT DETECTED Final   Staphylococcus species DETECTED (A) NOT DETECTED Final    Comment: CRITICAL RESULT CALLED TO, READ BACK BY AND VERIFIED WITH: [place] ON 10/28/20    Staphylococcus aureus (BCID) DETECTED (A) NOT DETECTED Final    Comment: Methicillin (oxacillin)-resistant Staphylococcus aureus (MRSA). MRSA is predictably resistant to beta-lactam  antibiotics (except ceftaroline). Preferred therapy is vancomycin unless clinically contraindicated. Patient requires contact precautions if  hospitalized. CRITICAL RESULT CALLED TO, READ BACK BY AND VERIFIED WITH: PHARMD JEREMY FRENN AT 0919 ON 10/28/20 BY KJ    Staphylococcus epidermidis NOT DETECTED NOT DETECTED Final   Staphylococcus lugdunensis NOT DETECTED NOT DETECTED Final   Streptococcus species NOT DETECTED NOT DETECTED Final   Streptococcus agalactiae NOT DETECTED NOT DETECTED Final   Streptococcus pneumoniae NOT DETECTED NOT DETECTED Final   Streptococcus pyogenes NOT DETECTED NOT DETECTED Final   A.calcoaceticus-baumannii NOT DETECTED NOT DETECTED Final   Bacteroides fragilis NOT DETECTED NOT DETECTED Final   Enterobacterales NOT DETECTED NOT DETECTED Final   Enterobacter cloacae complex NOT DETECTED NOT DETECTED Final   Escherichia coli NOT DETECTED NOT DETECTED Final   Klebsiella aerogenes NOT DETECTED NOT DETECTED Final   Klebsiella oxytoca NOT DETECTED NOT DETECTED Final   Klebsiella pneumoniae NOT DETECTED NOT DETECTED Final   Proteus species NOT DETECTED NOT DETECTED Final   Salmonella species NOT DETECTED NOT DETECTED Final   Serratia marcescens NOT DETECTED NOT  DETECTED Final   Haemophilus influenzae NOT DETECTED NOT DETECTED Final   Neisseria meningitidis NOT DETECTED NOT DETECTED Final   Pseudomonas aeruginosa NOT DETECTED NOT DETECTED Final   Stenotrophomonas maltophilia NOT DETECTED NOT DETECTED Final   Candida albicans NOT DETECTED NOT DETECTED Final   Candida auris NOT DETECTED NOT DETECTED Final   Candida glabrata NOT DETECTED NOT DETECTED Final   Candida krusei NOT DETECTED NOT DETECTED Final   Candida parapsilosis NOT DETECTED NOT DETECTED Final   Candida tropicalis NOT DETECTED NOT DETECTED Final   Cryptococcus neoformans/gattii NOT DETECTED NOT DETECTED Final   Meth resistant mecA/C and MREJ DETECTED (A) NOT DETECTED Final    Comment: CRITICAL RESULT CALLED TO, READ BACK BY AND VERIFIED WITH: PHARMD JEREMY FRENN AT 0919 ON 10/28/20 BY KJ Performed at [facility], [address] [place], [place] [address]   Respiratory (~20 pathogens) panel by PCR     Status: None   Collection Time: 10/27/20  5:00 PM   Specimen: Nasopharyngeal Swab; Respiratory  Result Value Ref Range Status   Adenovirus NOT DETECTED NOT DETECTED Final   Coronavirus 229E NOT DETECTED NOT DETECTED Final    Comment: (NOTE) The Coronavirus on the Respiratory Panel, DOES NOT test for the novel  Coronavirus (2019 nCoV)    Coronavirus HKU1 NOT DETECTED NOT DETECTED Final   Coronavirus NL63 NOT DETECTED NOT DETECTED Final   Coronavirus OC43 NOT DETECTED NOT DETECTED Final   Metapneumovirus NOT DETECTED NOT DETECTED Final   Rhinovirus / Enterovirus NOT DETECTED NOT DETECTED Final   Influenza A NOT DETECTED NOT DETECTED Final   Influenza B NOT DETECTED NOT DETECTED Final   Parainfluenza Virus 1 NOT DETECTED NOT DETECTED Final   Parainfluenza Virus 2 NOT DETECTED NOT DETECTED Final   Parainfluenza Virus 3 NOT DETECTED NOT DETECTED Final   Parainfluenza Virus 4 NOT DETECTED NOT DETECTED Final   Respiratory Syncytial Virus NOT DETECTED NOT DETECTED Final    Bordetella pertussis NOT DETECTED NOT DETECTED Final   Bordetella Parapertussis NOT DETECTED NOT DETECTED Final   Chlamydophila pneumoniae NOT DETECTED NOT DETECTED Final   Mycoplasma pneumoniae NOT DETECTED NOT DETECTED Final    Comment: Performed at [facility], [address] [place], [place] [address]     Radiology Studies: No results found.   LOS: 7 days    , MD [facility]  10/30/2020,   7:34 AM

## 2020-10-31 DIAGNOSIS — B9562 Methicillin resistant Staphylococcus aureus infection as the cause of diseases classified elsewhere: Secondary | ICD-10-CM | POA: Diagnosis not present

## 2020-10-31 DIAGNOSIS — R7881 Bacteremia: Secondary | ICD-10-CM | POA: Diagnosis not present

## 2020-10-31 LAB — CBC WITH DIFFERENTIAL/PLATELET
Abs Immature Granulocytes: 0.03 10*3/uL (ref 0.00–0.07)
Basophils Absolute: 0 10*3/uL (ref 0.0–0.1)
Basophils Relative: 0 %
Eosinophils Absolute: 0.4 10*3/uL (ref 0.0–0.5)
Eosinophils Relative: 6 %
HCT: 29.4 % — ABNORMAL LOW (ref 36.0–46.0)
Hemoglobin: 10.2 g/dL — ABNORMAL LOW (ref 12.0–15.0)
Immature Granulocytes: 0 %
Lymphocytes Relative: 25 %
Lymphs Abs: 1.8 10*3/uL (ref 0.7–4.0)
MCH: 33.4 pg (ref 26.0–34.0)
MCHC: 34.7 g/dL (ref 30.0–36.0)
MCV: 96.4 fL (ref 80.0–100.0)
Monocytes Absolute: 0.9 10*3/uL (ref 0.1–1.0)
Monocytes Relative: 13 %
Neutro Abs: 3.8 10*3/uL (ref 1.7–7.7)
Neutrophils Relative %: 56 %
Platelets: 250 10*3/uL (ref 150–400)
RBC: 3.05 MIL/uL — ABNORMAL LOW (ref 3.87–5.11)
RDW: 14.4 % (ref 11.5–15.5)
WBC: 7 10*3/uL (ref 4.0–10.5)
nRBC: 0 % (ref 0.0–0.2)

## 2020-10-31 LAB — BASIC METABOLIC PANEL
Anion gap: 11 (ref 5–15)
BUN: 32 mg/dL — ABNORMAL HIGH (ref 8–23)
CO2: 30 mmol/L (ref 22–32)
Calcium: 8.9 mg/dL (ref 8.9–10.3)
Chloride: 92 mmol/L — ABNORMAL LOW (ref 98–111)
Creatinine, Ser: 1.18 mg/dL — ABNORMAL HIGH (ref 0.44–1.00)
GFR, Estimated: 46 mL/min — ABNORMAL LOW (ref >60)
Glucose, Bld: 106 mg/dL — ABNORMAL HIGH (ref 70–99)
Potassium: 3.4 mmol/L — ABNORMAL LOW (ref 3.5–5.1)
Sodium: 133 mmol/L — ABNORMAL LOW (ref 135–145)

## 2020-10-31 MED ORDER — SODIUM CHLORIDE 0.9 % IV SOLN: INTRAVENOUS | Status: DC

## 2020-10-31 MED ORDER — TAMSULOSIN HCL 0.4 MG PO CAPS
0.4000 mg | ORAL_CAPSULE | Freq: Every day | ORAL | Status: DC
  Administered 2020-10-31 – 2020-11-05 (×6): 0.4 mg via ORAL
  Filled 2020-10-31 (×6): qty 1

## 2020-10-31 MED ORDER — POTASSIUM CHLORIDE CRYS ER 20 MEQ PO TBCR
40.0000 meq | EXTENDED_RELEASE_TABLET | Freq: Once | ORAL | Status: AC
  Administered 2020-10-31: 40 meq via ORAL
  Filled 2020-10-31: qty 2

## 2020-10-31 NOTE — Progress Notes (Signed)
Noted small pus filled blister in center of chest the was draining yellow colored pus. MD aware. Area cleaned.

## 2020-10-31 NOTE — Progress Notes (Signed)
PROGRESS NOTE    [name] [name]  OEV:[id number] DOB: February 15, 1941 DOA: 10/23/2020 PCP: [name], MD   Chief Complaint  Patient presents with  . Hypoxia  Brief Narrative: 80 year old female with CKD stage IIIb, congenital solitary kidney hypertension hyperlipidemia PVD presented with generalized weakness, shortness of breath.  In the ED chest x-ray showed resolution of previously seen right basilar pneumonia, underwent further work-up with VQ scan but could not be completed and unable to get CT angio due to elevated creatinine, Doppler of the leg negative for DVT. Atenolol 1 further evaluation on 4/18 with CT angiography due to chest pain and PE was ruled out.  Patient still having low-grade fever, blood culture on admission were negative UA unremarkable no pneumonia on CT chest procalcitonin ESR and CRP were elevated concern for ongoing infection underwent CT abdomen pelvis no acute pathology found.  Repeat blood culture on 4/20 grew 1 out of 2 bottles with MRSA, ID was consulted.  [facility] Hospitalization: 09/01/2020 to 09/10/2020:Sepsis with acute hypoxic respiratory failure due to pneumonia and altered mental status. She was treated with 5 days of antibiotics with MRSA coverage and was discharged to a SNF from IM teaching service  Subjective: Afebrile overnight Blood pressure stable Complains of feeling weak but no other new complaints.  On room air Complains of mild skin pain at the site of small pustule/boil on chest   Assessment & Plan:  MRSA bacteremia: She has had low-grade fever-that prompted further work-up and blood culture that was positive for 1 out of 2 MRSA. unknown source.  ID on board , pending TEE 4/25. Cont daptomycin.Repeat blood culture pending from 4/22-23 in process. Recent Labs  Lab 10/27/20 0408 10/27/20 0833 10/28/20 0139 10/29/20 0722 10/30/20 0107 10/31/20 0220  WBC 7.2  --  6.9 6.4 7.2 7.0  PROCALCITON  --  1.26  --   --   --   --    Acute hypoxic  respiratory failure/elevated D-dimer: Resolved.  On room air. no PE on CT angio, no DVT on Doppler.  Negative for COVID and respiratory virus panel.   LLE wounds/stasis dermatitis of lower extremities: Continue local wound care.    Epistaxis, transient.  No recurrence  Hyponatremia mild improved to 133.    Hypokalemia 3.4 replacement ordered overnight.  AKI on CKD stage IIIb, baseline creatinine~1.0, overall stable.  Monitor.  Recent Labs  Lab 10/27/20 0408 10/28/20 0139 10/29/20 0722 10/30/20 0107 10/31/20 0220  BUN $Re'21 22 23 'mAn$ 30* 32*  CREATININE 1.23* 1.23* 1.08* 1.19* 1.18*   Urine retention needing straight cath followed with indwelling Foley catheter has been removed and voiding well until 2 nights ago when she had retention needing straight cath x2-Foley was placed  4/22- VOIDING TRAIL in 1-2 days,cont flomax.  Microcytic anemia likely in the setting of CKD/chronic disease remains a stable 9 to 10 g.  Continue iron and B12 supplementation  Generalized weakness/failure to thrive: In the setting of multiple comorbidities, active infection.  TOC consulted, will need a skilled nursing facility.    PMR: Has a diagnosis of documented PMR, RA. Apparently patient not aware of diagnosis and daughter also not aware. ESR CRP ANA RF positive.  Patient without headache, tenderness on temporal area -giant cell arteritis not suspected.  Patient was mostly having generalized body aches and pain, Dr Doristine Bosworth discussed with ID regarding steroids and currently holding pending further infectious work-up.  Hypomagnesemia:Resolved  Right lower lobe nodule 7 mm radiology advises noncontrast CT chest in 6 to 12  months 2 cm left thyroid nodule outpatient ultrasound of the thyroid will be needed-follow-up with PCP  Essential hypertension: BP well controlled on amlodipine  PVD history of iliac stent angioplasty: Continue her aspirin, Plavix.  GERD on PPI  Anxiety/depression: Mood remained stable on  home  BuSpar, Depakote and Remeron  Lumbar radiculopathy continue pain meds  Morbid obesity BMI 30.6 will benefit with weight loss outpatient PCP follow-up  Diet Order            Diet NPO time specified Except for: Sips with Meds  Diet effective ____           Diet Heart Room service appropriate? Yes; Fluid consistency: Thin  Diet effective now               Patient's Body mass index is 30.61 kg/m.  DVT prophylaxis: enoxaparin (LOVENOX) injection 30 mg Start: 10/27/20 0800 SCDs Start: 10/23/20 1917 Code Status:   Code Status: DNR  Family Communication: plan of care discussed with patient at bedside. Point of contact-grandson  Status is: Inpatient Remains inpatient appropriate because:Ongoing active pain requiring inpatient pain management and Inpatient level of care appropriate due to severity of illness  Dispo: The patient is from: Home, with home health nurse              Anticipated d/c is to: SNF once work-up completed, ~2 days              Patient currently is not medically stable to d/c.   Difficult to place patient No    Unresulted Labs (From admission, onward)          Start     Ordered   10/31/20 7177  Basic metabolic panel  Daily,   R     Question:  Specimen collection method  Answer:  Lab=Lab collect   10/30/20 0825   10/30/20 0927  Culture, blood (Routine X 2) w Reflex to ID Panel  BLOOD CULTURE X 2,   R (with TIMED occurrences)      10/30/20 0926   10/29/20 0500  CK  Weekly,   R     Question:  Specimen collection method  Answer:  Lab=Lab collect   10/28/20 1515   10/26/20 0500  CBC with Differential/Platelet  Daily,   R     Question:  Specimen collection method  Answer:  Lab=Lab collect   10/25/20 1225          Medications reviewed:  Scheduled Meds: . amLODipine  5 mg Oral Daily  . aspirin  81 mg Oral Daily  . busPIRone  15 mg Oral TID  . Chlorhexidine Gluconate Cloth  6 each Topical Daily  . cholecalciferol  1,000 Units Oral Daily  .  clopidogrel  75 mg Oral q morning  . divalproex  125 mg Oral BID  . dorzolamide  1 drop Both Eyes BID   And  . timolol  1 drop Both Eyes BID  . enoxaparin (LOVENOX) injection  30 mg Subcutaneous Q24H  . ferrous sulfate  325 mg Oral Daily  . furosemide  40 mg Intravenous Daily  . gabapentin  100 mg Oral TID  . latanoprost  1 drop Both Eyes QHS  . mirtazapine  15 mg Oral QHS  . pantoprazole  40 mg Oral Daily  . polyethylene glycol  17 g Oral Daily  . vitamin B-12  100 mcg Oral Daily   Continuous Infusions: . DAPTOmycin (CUBICIN)  IV 500 mg (10/30/20 2107)  Consultants: ID Cardiology   Procedures:see note  Antimicrobials: Anti-infectives (From admission, onward)   Start     Dose/Rate Route Frequency Ordered Stop   10/28/20 1030  DAPTOmycin (CUBICIN) 500 mg in sodium chloride 0.9 % IVPB        500 mg 120 mL/hr over 30 Minutes Intravenous Daily 10/28/20 0930       Culture/Microbiology    Component Value Date/Time   SDES BLOOD LEFT ANTECUBITAL 10/29/2020 1457   SPECREQUEST  10/29/2020 1457    BOTTLES DRAWN AEROBIC AND ANAEROBIC Blood Culture adequate volume   CULT  10/29/2020 1457    NO GROWTH < 24 HOURS Performed at [facility], [place] [address], [place], [place] [address]    REPTSTATUS PENDING 10/29/2020 1457    Other culture-see note  Objective: Vitals: Today's Vitals   10/30/20 2112 10/30/20 2154 10/31/20 0516 10/31/20 0619  BP:  (!) 104/59 107/71   Pulse:  80 97   Resp:   12   Temp:  98.4 F (36.9 C) 99 F (37.2 C)   TempSrc:  Oral Oral   SpO2:  90% 92%   Weight:      PainSc: 0-No pain   8     Intake/Output Summary (Last 24 hours) at 10/31/2020 0726 Last data filed at 10/31/2020 0519 Gross per 24 hour  Intake 240 ml  Output 2675 ml  Net -2435 ml   Filed Weights   10/23/20 2040  Weight: 71.1 kg   Weight change:   Intake/Output from previous day: 04/23 0701 - 04/24 0700 In: 240 [P.O.:240] Out: 2675 [Urine:2675] Intake/Output this  shift: No intake/output data recorded. Filed Weights   10/23/20 2040  Weight: 71.1 kg    Examination: General exam: AAox3, elderly frail NAD, weak appearing. HEENT:Oral mucosa moist, Ear/Nose WNL grossly, dentition normal. Respiratory system: bilaterally clear,no wheezing or crackles,no use of accessory muscle Cardiovascular system: S1 & S2 +, No JVD,. Gastrointestinal system: Abdomen soft, NT,ND, BS+ Nervous System:Alert, awake, moving extremities and grossly nonfocal Extremities: No edema, distal peripheral pulses palpable.  Skin: No rashes,no icterus. MSK: Normal muscle bulk,tone, power Small pustular eruption on the anterior chest    Data Reviewed: I have personally reviewed following labs and imaging studies CBC: Recent Labs  Lab 10/27/20 0408 10/28/20 0139 10/29/20 0722 10/30/20 0107 10/31/20 0220  WBC 7.2 6.9 6.4 7.2 7.0  NEUTROABS 3.9 4.1 3.6 3.8 3.8  HGB 11.1* 10.4* 10.6* 9.7* 10.2*  HCT 33.4* 31.1* 31.8* 28.4* 29.4*  MCV 101.2* 100.0 99.4 98.3 96.4  PLT 196 218 221 229 720   Basic Metabolic Panel: Recent Labs  Lab 10/25/20 0353 10/26/20 0253 10/27/20 0408 10/28/20 0139 10/29/20 0722 10/30/20 0107 10/31/20 0220  NA 139 135 136 134* 136 130* 133*  K 4.2 4.2 4.0 4.2 3.6 4.0 3.4*  CL 102 99 97* 94* 96* 92* 92*  CO2 _0 GLUCOSE 101* 97 95 94 93 99 106*  BUN _1 30* 32*  CREATININE 1.23* 1.24* 1.23* 1.23* 1.08* 1.19* 1.18*  CALCIUM 9.4 9.2 9.1 8.8* 9.1 8.7* 8.9  MG 2.0 1.8  --  1.8  --   --   --    GFR: Estimated Creatinine Clearance: 32.9 mL/min (A) (by C-G formula based on SCr of 1.18 mg/dL (H)). Liver Function Tests: Recent Labs  Lab 10/25/20 0353 10/26/20 0253 10/27/20 0408 10/28/20 0139  AST 12* 10* 20 32  ALT _2 11  ALKPHOS 56 53 49 51  BILITOT 0.9 0.9 0.7 0.5  PROT 6.1* 5.8* 6.3* 6.0*  ALBUMIN 2.9* 2.6* 2.7* 2.5*   No results for input(s): LIPASE, AMYLASE in the last 168 hours. No results for  input(s): AMMONIA in the last 168 hours. Coagulation Profile: No results for input(s): INR, PROTIME in the last 168 hours. Cardiac Enzymes: Recent Labs  Lab 10/29/20 0722  CKTOTAL 130   BNP (last 3 results) No results for input(s): PROBNP in the last 8760 hours. HbA1C: No results for input(s): HGBA1C in the last 72 hours. CBG: No results for input(s): GLUCAP in the last 168 hours. Lipid Profile: No results for input(s): CHOL, HDL, LDLCALC, TRIG, CHOLHDL, LDLDIRECT in the last 72 hours. Thyroid Function Tests: No results for input(s): TSH, T4TOTAL, FREET4, T3FREE, THYROIDAB in the last 72 hours. Anemia Panel: No results for input(s): VITAMINB12, FOLATE, FERRITIN, TIBC, IRON, RETICCTPCT in the last 72 hours. Sepsis Labs: Recent Labs  Lab 10/27/20 0833  PROCALCITON 1.26    Recent Results (from the past 240 hour(s))  Blood culture (routine x 2)     Status: None   Collection Time: 10/23/20  2:10 PM   Specimen: BLOOD  Result Value Ref Range Status   Specimen Description BLOOD SITE NOT SPECIFIED  Final   Special Requests   Final    BOTTLES DRAWN AEROBIC AND ANAEROBIC Blood Culture adequate volume   Culture   Final    NO GROWTH 5 DAYS Performed at [facility], [address], [place], [place] [address]    Report Status 10/28/2020 FINAL  Final  Blood culture (routine x 2)     Status: None   Collection Time: 10/23/20  2:26 PM   Specimen: BLOOD  Result Value Ref Range Status   Specimen Description BLOOD SITE NOT SPECIFIED  Final   Special Requests   Final    BOTTLES DRAWN AEROBIC AND ANAEROBIC Blood Culture adequate volume   Culture   Final    NO GROWTH 5 DAYS Performed at [facility], [place] [address], [place],  [address]    Report Status 10/28/2020 FINAL  Final  Resp Panel by RT-PCR (Flu A&B, Covid) Nasopharyngeal Swab     Status: None   Collection Time: 10/27/20 10:54 AM   Specimen: Nasopharyngeal Swab; Nasopharyngeal(NP) swabs in vial transport  medium  Result Value Ref Range Status   SARS Coronavirus 2 by RT PCR NEGATIVE NEGATIVE Final    Comment: (NOTE) SARS-CoV-2 target nucleic acids are NOT DETECTED.  The SARS-CoV-2 RNA is generally detectable in upper respiratory specimens during the acute phase of infection. The lowest concentration of SARS-CoV-2 viral copies this assay can detect is 138 copies/mL. A negative result does not preclude SARS-Cov-2 infection and should not be used as the sole basis for treatment or other patient management decisions. A negative result may occur with  improper specimen collection/handling, submission of specimen other than nasopharyngeal swab, presence of viral mutation(s) within the areas targeted by this assay, and inadequate number of viral copies(<138 copies/mL). A negative result must be combined with clinical observations, patient history, and epidemiological information. The expected result is Negative.  Fact Sheet for Patients:  EntrepreneurPulse.com.au  Fact Sheet for Healthcare Providers:  IncredibleEmployment.be  This test is no t yet approved or cleared by the [place] FDA and  has been authorized for detection and/or diagnosis of SARS-CoV-2 by FDA under an Emergency Use Authorization (EUA). This EUA will remain  in effect (meaning this  test can be used) for the duration of the COVID-19 declaration under Section 564(b)(1) of the Act, 21 U.S.C.section 360bbb-3(b)(1), unless the authorization is terminated  or revoked sooner.       Influenza A by PCR NEGATIVE NEGATIVE Final   Influenza B by PCR NEGATIVE NEGATIVE Final    Comment: (NOTE) The Xpert Xpress SARS-CoV-2/FLU/RSV plus assay is intended as an aid in the diagnosis of influenza from Nasopharyngeal swab specimens and should not be used as a sole basis for treatment. Nasal washings and aspirates are unacceptable for Xpert Xpress SARS-CoV-2/FLU/RSV testing.  Fact Sheet for  Patients: EntrepreneurPulse.com.au  Fact Sheet for Healthcare Providers: IncredibleEmployment.be  This test is not yet approved or cleared by the [place] FDA and has been authorized for detection and/or diagnosis of SARS-CoV-2 by FDA under an Emergency Use Authorization (EUA). This EUA will remain in effect (meaning this test can be used) for the duration of the COVID-19 declaration under Section 564(b)(1) of the Act, 21 U.S.C. section 360bbb-3(b)(1), unless the authorization is terminated or revoked.  Performed at [facility], [place] [address] [place], [place] [address]   Culture, blood (routine x 2)     Status: None (Preliminary result)   Collection Time: 10/27/20  2:00 PM   Specimen: BLOOD  Result Value Ref Range Status   Specimen Description BLOOD LEFT ANTECUBITAL  Final   Special Requests   Final    BOTTLES DRAWN AEROBIC AND ANAEROBIC Blood Culture adequate volume   Culture   Final    NO GROWTH 3 DAYS Performed at [facility], [place] [address], [place], [place] [address]    Report Status PENDING  Incomplete  Culture, blood (routine x 2)     Status: Abnormal   Collection Time: 10/27/20  2:05 PM   Specimen: BLOOD  Result Value Ref Range Status   Specimen Description BLOOD RIGHT ANTECUBITAL  Final   Special Requests   Final    BOTTLES DRAWN AEROBIC AND ANAEROBIC Blood Culture adequate volume   Culture  Setup Time   Final    GRAM POSITIVE COCCI IN CLUSTERS AEROBIC BOTTLE ONLY CRITICAL RESULT CALLED TO, READ BACK BY AND VERIFIED WITH: PHARMD JEREMY FRENN AT 5701 ON 10/28/20 BY KJ Performed at [facility], [place] [address] [place], [place] [address]    Culture METHICILLIN RESISTANT STAPHYLOCOCCUS AUREUS (A)  Final   Report Status 10/30/2020 FINAL  Final   Organism ID, Bacteria METHICILLIN RESISTANT STAPHYLOCOCCUS AUREUS  Final      Susceptibility   Methicillin resistant staphylococcus aureus - MIC*    CIPROFLOXACIN  >=8 RESISTANT Resistant     ERYTHROMYCIN >=8 RESISTANT Resistant     GENTAMICIN <=0.5 SENSITIVE Sensitive     OXACILLIN >=4 RESISTANT Resistant     TETRACYCLINE <=1 SENSITIVE Sensitive     VANCOMYCIN <=0.5 SENSITIVE Sensitive     TRIMETH/SULFA <=10 SENSITIVE Sensitive     CLINDAMYCIN <=0.25 SENSITIVE Sensitive     RIFAMPIN <=0.5 SENSITIVE Sensitive     Inducible Clindamycin NEGATIVE Sensitive     * METHICILLIN RESISTANT STAPHYLOCOCCUS AUREUS  Blood Culture ID Panel (Reflexed)     Status: Abnormal   Collection Time: 10/27/20  2:05 PM  Result Value Ref Range Status   Enterococcus faecalis NOT DETECTED NOT DETECTED Final   Enterococcus Faecium NOT DETECTED NOT DETECTED Final   Listeria monocytogenes NOT DETECTED NOT DETECTED Final   Staphylococcus species DETECTED (A) NOT DETECTED Final    Comment: CRITICAL RESULT  CALLED TO, READ BACK BY AND VERIFIED WITH: [place] ON 10/28/20    Staphylococcus aureus (BCID) DETECTED (A) NOT DETECTED Final    Comment: Methicillin (oxacillin)-resistant Staphylococcus aureus (MRSA). MRSA is predictably resistant to beta-lactam antibiotics (except ceftaroline). Preferred therapy is vancomycin unless clinically contraindicated. Patient requires contact precautions if  hospitalized. CRITICAL RESULT CALLED TO, READ BACK BY AND VERIFIED WITH: [place] AT 0919 ON 10/28/20 BY KJ    Staphylococcus epidermidis NOT DETECTED NOT DETECTED Final   Staphylococcus lugdunensis NOT DETECTED NOT DETECTED Final   Streptococcus species NOT DETECTED NOT DETECTED Final   Streptococcus agalactiae NOT DETECTED NOT DETECTED Final   Streptococcus pneumoniae NOT DETECTED NOT DETECTED Final   Streptococcus pyogenes NOT DETECTED NOT DETECTED Final   A.calcoaceticus-baumannii NOT DETECTED NOT DETECTED Final   Bacteroides fragilis NOT DETECTED NOT DETECTED Final   Enterobacterales NOT DETECTED NOT DETECTED Final   Enterobacter cloacae complex NOT  DETECTED NOT DETECTED Final   Escherichia coli NOT DETECTED NOT DETECTED Final   Klebsiella aerogenes NOT DETECTED NOT DETECTED Final   Klebsiella oxytoca NOT DETECTED NOT DETECTED Final   Klebsiella pneumoniae NOT DETECTED NOT DETECTED Final   Proteus species NOT DETECTED NOT DETECTED Final   Salmonella species NOT DETECTED NOT DETECTED Final   Serratia marcescens NOT DETECTED NOT DETECTED Final   Haemophilus influenzae NOT DETECTED NOT DETECTED Final   Neisseria meningitidis NOT DETECTED NOT DETECTED Final   Pseudomonas aeruginosa NOT DETECTED NOT DETECTED Final   Stenotrophomonas maltophilia NOT DETECTED NOT DETECTED Final   Candida albicans NOT DETECTED NOT DETECTED Final   Candida auris NOT DETECTED NOT DETECTED Final   Candida glabrata NOT DETECTED NOT DETECTED Final   Candida krusei NOT DETECTED NOT DETECTED Final   Candida parapsilosis NOT DETECTED NOT DETECTED Final   Candida tropicalis NOT DETECTED NOT DETECTED Final   Cryptococcus neoformans/gattii NOT DETECTED NOT DETECTED Final   Meth resistant mecA/C and MREJ DETECTED (A) NOT DETECTED Final    Comment: CRITICAL RESULT CALLED TO, READ BACK BY AND VERIFIED WITH: PHARMD JEREMY FRENN AT 6004 ON 10/28/20 BY KJ Performed at [facility], [address] [place], [place] [address]   Respiratory (~20 pathogens) panel by PCR     Status: None   Collection Time: 10/27/20  5:00 PM   Specimen: Nasopharyngeal Swab; Respiratory  Result Value Ref Range Status   Adenovirus NOT DETECTED NOT DETECTED Final   Coronavirus 229E NOT DETECTED NOT DETECTED Final    Comment: (NOTE) The Coronavirus on the Respiratory Panel, DOES NOT test for the novel  Coronavirus (2019 nCoV)    Coronavirus HKU1 NOT DETECTED NOT DETECTED Final   Coronavirus NL63 NOT DETECTED NOT DETECTED Final   Coronavirus OC43 NOT DETECTED NOT DETECTED Final   Metapneumovirus NOT DETECTED NOT DETECTED Final   Rhinovirus / Enterovirus NOT DETECTED NOT DETECTED Final    Influenza A NOT DETECTED NOT DETECTED Final   Influenza B NOT DETECTED NOT DETECTED Final   Parainfluenza Virus 1 NOT DETECTED NOT DETECTED Final   Parainfluenza Virus 2 NOT DETECTED NOT DETECTED Final   Parainfluenza Virus 3 NOT DETECTED NOT DETECTED Final   Parainfluenza Virus 4 NOT DETECTED NOT DETECTED Final   Respiratory Syncytial Virus NOT DETECTED NOT DETECTED Final   Bordetella pertussis NOT DETECTED NOT DETECTED Final   Bordetella Parapertussis NOT DETECTED NOT DETECTED Final   Chlamydophila pneumoniae NOT DETECTED NOT DETECTED Final   Mycoplasma pneumoniae NOT DETECTED  NOT DETECTED Final    Comment: Performed at [facility], [place] [address] [place], [place] [address]  Culture, blood (Routine X 2) w Reflex to ID Panel     Status: None (Preliminary result)   Collection Time: 10/29/20  2:56 PM   Specimen: BLOOD  Result Value Ref Range Status   Specimen Description BLOOD RIGHT ANTECUBITAL  Final   Special Requests   Final    BOTTLES DRAWN AEROBIC AND ANAEROBIC Blood Culture adequate volume   Culture   Final    NO GROWTH < 24 HOURS Performed at [facility], [place] [address], [place], [place] [address]    Report Status PENDING  Incomplete  Culture, blood (Routine X 2) w Reflex to ID Panel     Status: None (Preliminary result)   Collection Time: 10/29/20  2:57 PM   Specimen: BLOOD  Result Value Ref Range Status   Specimen Description BLOOD LEFT ANTECUBITAL  Final   Special Requests   Final    BOTTLES DRAWN AEROBIC AND ANAEROBIC Blood Culture adequate volume   Culture   Final    NO GROWTH < 24 HOURS Performed at [facility], [place] [address] [place], [place] [address]    Report Status PENDING  Incomplete     Radiology Studies: No results found.   LOS: 8 days   Antonieta Pert, MD [facility]  10/31/2020, 7:26 AM

## 2020-10-31 NOTE — Plan of Care (Signed)
  Problem: Activity: Goal: Ability to tolerate increased activity will improve Outcome: Progressing   Problem: Clinical Measurements: Goal: Ability to maintain a body temperature in the normal range will improve Outcome: Progressing   Problem: Respiratory: Goal: Ability to maintain adequate ventilation will improve Outcome: Progressing Goal: Ability to maintain a clear airway will improve Outcome: Progressing   

## 2020-10-31 NOTE — Anesthesia Preprocedure Evaluation (Addendum)
Anesthesia Evaluation  Patient identified by MRN, date of birth, ID band Patient awake    Reviewed: Allergy & Precautions, NPO status , Patient's Chart, lab work & pertinent test results  History of Anesthesia Complications Negative for: history of anesthetic complications  Airway Mallampati: II  TM Distance: >3 FB Neck ROM: Full    Dental  (+) Edentulous Upper, Edentulous Lower   Pulmonary sleep apnea , former smoker,    Pulmonary exam normal        Cardiovascular hypertension, Pt. on medications + Peripheral Vascular Disease  Normal cardiovascular exam  TTE 10/25/20: EF 60-65%, mild LVH with focal discreet  thickening of basal septal segment, grade I DD, valves ok   Neuro/Psych Anxiety Depression negative neurological ROS     GI/Hepatic Neg liver ROS, GERD  Medicated and Controlled,  Endo/Other  negative endocrine ROS  Renal/GU Renal InsufficiencyRenal disease (solitary kidney)  negative genitourinary   Musculoskeletal  (+) Arthritis ,   Abdominal   Peds  Hematology  (+) anemia , Hgb 10.2   Anesthesia Other Findings MRSA bacteremia  Reproductive/Obstetrics negative OB ROS                            Anesthesia Physical Anesthesia Plan  ASA: IV  Anesthesia Plan: MAC   Post-op Pain Management:    Induction:   PONV Risk Score and Plan: 2 and Treatment may vary due to age or medical condition and Propofol infusion  Airway Management Planned: Natural Airway and Nasal Cannula  Additional Equipment: None  Intra-op Plan:   Post-operative Plan:   Informed Consent: I have reviewed the patients History and Physical, chart, labs and discussed the procedure including the risks, benefits and alternatives for the proposed anesthesia with the patient or authorized representative who has indicated his/her understanding and acceptance.   Patient has DNR.  Discussed DNR with patient and  Suspend DNR.   Dental advisory given  Plan Discussed with: CRNA  Anesthesia Plan Comments:        Anesthesia Quick Evaluation

## 2020-11-01 ENCOUNTER — Encounter (HOSPITAL_COMMUNITY): Payer: Self-pay | Admitting: Internal Medicine

## 2020-11-01 ENCOUNTER — Inpatient Hospital Stay: Payer: Self-pay

## 2020-11-01 ENCOUNTER — Inpatient Hospital Stay (HOSPITAL_COMMUNITY): Payer: PPO

## 2020-11-01 ENCOUNTER — Inpatient Hospital Stay (HOSPITAL_COMMUNITY): Payer: PPO | Admitting: Anesthesiology

## 2020-11-01 ENCOUNTER — Encounter (HOSPITAL_COMMUNITY)
Admission: EM | Disposition: A | Discharge status: Skilled Nursing Facility | Payer: Self-pay | Source: Home / Self Care | Attending: Internal Medicine

## 2020-11-01 DIAGNOSIS — R7881 Bacteremia: Secondary | ICD-10-CM | POA: Diagnosis not present

## 2020-11-01 DIAGNOSIS — B9562 Methicillin resistant Staphylococcus aureus infection as the cause of diseases classified elsewhere: Secondary | ICD-10-CM | POA: Diagnosis not present

## 2020-11-01 DIAGNOSIS — I340 Nonrheumatic mitral (valve) insufficiency: Secondary | ICD-10-CM

## 2020-11-01 HISTORY — PX: TEE WITHOUT CARDIOVERSION: SHX5443

## 2020-11-01 LAB — CBC WITH DIFFERENTIAL/PLATELET
Abs Immature Granulocytes: 0.03 10*3/uL (ref 0.00–0.07)
Basophils Absolute: 0 10*3/uL (ref 0.0–0.1)
Basophils Relative: 0 %
Eosinophils Absolute: 0.5 10*3/uL (ref 0.0–0.5)
Eosinophils Relative: 8 %
HCT: 28.6 % — ABNORMAL LOW (ref 36.0–46.0)
Hemoglobin: 9.4 g/dL — ABNORMAL LOW (ref 12.0–15.0)
Immature Granulocytes: 1 %
Lymphocytes Relative: 28 %
Lymphs Abs: 1.7 10*3/uL (ref 0.7–4.0)
MCH: 32.6 pg (ref 26.0–34.0)
MCHC: 32.9 g/dL (ref 30.0–36.0)
MCV: 99.3 fL (ref 80.0–100.0)
Monocytes Absolute: 0.8 10*3/uL (ref 0.1–1.0)
Monocytes Relative: 13 %
Neutro Abs: 3 10*3/uL (ref 1.7–7.7)
Neutrophils Relative %: 50 %
Platelets: 257 10*3/uL (ref 150–400)
RBC: 2.88 MIL/uL — ABNORMAL LOW (ref 3.87–5.11)
RDW: 14.4 % (ref 11.5–15.5)
WBC: 6 10*3/uL (ref 4.0–10.5)
nRBC: 0 % (ref 0.0–0.2)

## 2020-11-01 LAB — CULTURE, BLOOD (ROUTINE X 2)
Culture: NO GROWTH
Special Requests: ADEQUATE

## 2020-11-01 LAB — BASIC METABOLIC PANEL
Anion gap: 8 (ref 5–15)
BUN: 28 mg/dL — ABNORMAL HIGH (ref 8–23)
CO2: 30 mmol/L (ref 22–32)
Calcium: 8.8 mg/dL — ABNORMAL LOW (ref 8.9–10.3)
Chloride: 97 mmol/L — ABNORMAL LOW (ref 98–111)
Creatinine, Ser: 1.25 mg/dL — ABNORMAL HIGH (ref 0.44–1.00)
GFR, Estimated: 43 mL/min — ABNORMAL LOW (ref >60)
Glucose, Bld: 103 mg/dL — ABNORMAL HIGH (ref 70–99)
Potassium: 3.9 mmol/L (ref 3.5–5.1)
Sodium: 135 mmol/L (ref 135–145)

## 2020-11-01 SURGERY — ECHOCARDIOGRAM, TRANSESOPHAGEAL
Anesthesia: Monitor Anesthesia Care

## 2020-11-01 MED ORDER — ENOXAPARIN SODIUM 40 MG/0.4ML ~~LOC~~ SOLN
40.0000 mg | SUBCUTANEOUS | Status: DC
  Administered 2020-11-02 – 2020-11-05 (×4): 40 mg via SUBCUTANEOUS
  Filled 2020-11-01 (×4): qty 0.4

## 2020-11-01 MED ORDER — PROPOFOL 500 MG/50ML IV EMUL
INTRAVENOUS | Status: DC | PRN
  Administered 2020-11-01: 75 ug/kg/min via INTRAVENOUS

## 2020-11-01 MED ORDER — PROPOFOL 10 MG/ML IV BOLUS
INTRAVENOUS | Status: DC | PRN
  Administered 2020-11-01: 20 mg via INTRAVENOUS
  Administered 2020-11-01 (×2): 10 mg via INTRAVENOUS

## 2020-11-01 MED ORDER — LACTATED RINGERS IV SOLN: INTRAVENOUS | Status: DC

## 2020-11-01 NOTE — Progress Notes (Signed)
PROGRESS NOTE    [name] [name]  DXI:[id number] DOB: 02-20-1941 DOA: 10/23/2020 PCP: [name], MD   Chief Complaint  Patient presents with  . Hypoxia  Brief Narrative: 80 year old female with CKD stage IIIb, congenital solitary kidney hypertension hyperlipidemia PVD presented with generalized weakness, shortness of breath.  In the ED chest x-ray showed resolution of previously seen right basilar pneumonia, underwent further work-up with VQ scan but could not be completed and unable to get CT angio due to elevated creatinine, Doppler of the leg negative for DVT. Atenolol 1 further evaluation on 4/18 with CT angiography due to chest pain and PE was ruled out.  Patient still having low-grade fever, blood culture on admission were negative UA unremarkable no pneumonia on CT chest procalcitonin ESR and CRP were elevated concern for ongoing infection underwent CT abdomen pelvis no acute pathology found.  Repeat blood culture on 4/20 grew 1 out of 2 bottles with MRSA, ID was consulted.  [facility]: 09/01/2020 to 09/10/2020:Sepsis with acute hypoxic respiratory failure due to pneumonia and altered mental status. She was treated with 5 days of antibiotics with MRSA coverage and was discharged to a SNF from IM teaching service.  Subjective: Seen and examined. s/p TEE this morning. Blood pressure start Lasix amlodipine held. Patient has no question.  Assessment & Plan:  MRSA bacteremia: She has had low-grade fever-that prompted further work-up and blood culture that was positive for 1 out of 2 MRSA. unknown source.  ID on board awaiting TEE result.  Remains on daptomycin as per ID monitor CK routine labs.  Repeat blood culture 4/22-23 no growth so far.    Acute hypoxic respiratory failure/elevated D-dimer: Resolved.  On room air. no PE on CT angio, no DVT on Doppler.  Negative for COVID and respiratory virus panel.  Discontinue Lasix.  LLE wounds/stasis dermatitis of lower extremities:  Continue local wound care.  Stop IV Lasix today.  Epistaxis, transient.  No recurrence  Hyponatremia mild improved.  Hypokalemia 3.4 resolved.  AKI on CKD stage IIIb, baseline creatinine~1.0, overall stable at 1.2 holding diuretics. Recent Labs  Lab 10/28/20 0139 10/29/20 0722 10/30/20 0107 10/31/20 0220 11/01/20 0244  BUN 22 23 30* 32* 28*  CREATININE 1.23* 1.08* 1.19* 1.18* 1.25*   Urine retention needing straight cath followed with indwelling Foley catheter has been removed and voiding well until 2 nights ago when she had retention needing straight cath x2-Foley was placed  4/22- VOIDING TRAIL today, cont flomax.  Microcytic anemia likely in the setting of CKD/chronic disease remains a stable 9 to 10 g.  Continue iron and B12 supplementation  Generalized weakness/failure to thrive: In the setting of multiple comorbidities, active infection.  TOC consulted, will need SNF placement.    PMR: Has a diagnosis of documented PMR, RA. Apparently patient not aware of diagnosis and daughter also not aware. ESR CRP ANA RF positive.  Patient without headache, tenderness on temporal area -giant cell arteritis not suspected.  Patient was mostly having generalized body aches and pain, Dr Doristine Bosworth discussed with ID regarding steroids and currently holding 2/2 infection.  She will need close follow-up.  Hypomagnesemia:Resolved  Right lower lobe nodule 7 mm radiology advises noncontrast CT chest in 6 to 12 months 2 cm left thyroid nodule outpatient ultrasound of the thyroid will be needed-follow-up with PCP  Essential hypertension: Soft hold amlodipine today.    PVD history of iliac stent angioplasty: Continue her aspirin, Plavix.  GERD continue PPI  Anxiety/depression: Mood stable, continue BuSpar, Depakote  and Remeron.  Lumbar radiculopathy continue pain meds  Morbid obesity BMI 30.6 will benefit with weight loss outpatient PCP follow-up  Diet Order            Diet Heart Room service  appropriate? No; Fluid consistency: Thin  Diet effective now               Patient's Body mass index is 30.61 kg/m.  DVT prophylaxis: enoxaparin (LOVENOX) injection 40 mg Start: 11/02/20 0800 SCDs Start: 10/23/20 1917 Code Status:   Code Status: DNR  Family Communication: plan of care discussed with patient at bedside. I called her daughter Geoffry Paradise- POA,no answer.  Status is: Inpatient Remains inpatient appropriate because:Ongoing active pain requiring inpatient pain management and Inpatient level of care appropriate due to severity of illness  Dispo: The patient is from: Home, with home health nurse              Anticipated d/c is to: SNF once work-up completed, 1-2 days and okay /w ID.              Patient currently is not medically stable to d/c.   Difficult to place patient No    Unresulted Labs (From admission, onward)          Start     Ordered   10/31/20 4268  Basic metabolic panel  Daily,   R     Question:  Specimen collection method  Answer:  Lab=Lab collect   10/30/20 0825   10/29/20 0500  CK  Weekly,   R     Question:  Specimen collection method  Answer:  Lab=Lab collect   10/28/20 1515   10/26/20 0500  CBC with Differential/Platelet  Daily,   R     Question:  Specimen collection method  Answer:  Lab=Lab collect   10/25/20 1225          Medications reviewed:  Scheduled Meds: . amLODipine  5 mg Oral Daily  . aspirin  81 mg Oral Daily  . busPIRone  15 mg Oral TID  . Chlorhexidine Gluconate Cloth  6 each Topical Daily  . cholecalciferol  1,000 Units Oral Daily  . clopidogrel  75 mg Oral q morning  . divalproex  125 mg Oral BID  . dorzolamide  1 drop Both Eyes BID   And  . timolol  1 drop Both Eyes BID  . [START ON 11/02/2020] enoxaparin (LOVENOX) injection  40 mg Subcutaneous Q24H  . ferrous sulfate  325 mg Oral Daily  . gabapentin  100 mg Oral TID  . latanoprost  1 drop Both Eyes QHS  . mirtazapine  15 mg Oral QHS  . pantoprazole  40 mg Oral Daily  .  polyethylene glycol  17 g Oral Daily  . tamsulosin  0.4 mg Oral Daily  . vitamin B-12  100 mcg Oral Daily   Continuous Infusions: . DAPTOmycin (CUBICIN)  IV 500 mg (10/31/20 1830)  . lactated ringers 125 mL/hr at 11/01/20 3419    Consultants: ID Cardiology   Procedures:see note  Antimicrobials: Anti-infectives (From admission, onward)   Start     Dose/Rate Route Frequency Ordered Stop   10/28/20 1030  DAPTOmycin (CUBICIN) 500 mg in sodium chloride 0.9 % IVPB        500 mg 120 mL/hr over 30 Minutes Intravenous Daily 10/28/20 0930       Culture/Microbiology    Component Value Date/Time   SDES BLOOD LEFT ANTECUBITAL 10/30/2020 1012   SPECREQUEST  10/30/2020 1012  BOTTLES DRAWN AEROBIC ONLY Blood Culture results may not be optimal due to an inadequate volume of blood received in culture bottles   CULT  10/30/2020 1012    NO GROWTH 2 DAYS Performed at [facility], [place] [address] [place], [place] [address]    REPTSTATUS PENDING 10/30/2020 1012    Other culture-see note  Objective: Vitals: Today's Vitals   11/01/20 0711 11/01/20 0812 11/01/20 0821 11/01/20 0841  BP: 105/66 112/79 (!) 131/56 (!) 112/51  Pulse: 90 (!) 104 97 90  Resp: 15 13 19 20   Temp: 98.1 F (36.7 C) (!) 97.2 F (36.2 C)  97.7 F (36.5 C)  TempSrc: Oral Temporal  Oral  SpO2: 93% 98% 93% 95%  Weight: 71.1 kg     Height: 5' (1.524 m)     PainSc: 0-No pain 3  3      Intake/Output Summary (Last 24 hours) at 11/01/2020 1008 Last data filed at 11/01/2020 0806 Gross per 24 hour  Intake 1920.79 ml  Output 1250 ml  Net 670.79 ml   Filed Weights   10/23/20 2040 11/01/20 0711  Weight: 71.1 kg 71.1 kg   Weight change:   Intake/Output from previous day: 04/24 0701 - 04/25 0700 In: 1520.8 [I.V.:220.8; IV Piggyback:1300] Out: 1250 [Urine:1250] Intake/Output this shift: Total I/O In: 400 [I.V.:400] Out: -  Filed Weights   10/23/20 2040 11/01/20 0711  Weight: 71.1 kg 71.1 kg     Examination: General exam: AAO, elderly, frail, on room air NAD, weak appearing. HEENT:Oral mucosa moist, Ear/Nose WNL grossly, dentition normal. Respiratory system: bilaterally diminishedd,no wheezing or crackles,no use of accessory muscle Cardiovascular system: S1 & S2 +, No JVD,. Gastrointestinal system: Abdomen soft, NT,ND, BS+ Nervous System:Alert, awake, moving extremities and grossly nonfocal Extremities: Slightly edematous lower extremity with wound dressing in place Skin: No rashes,no icterus. MSK: Normal muscle bulk,tone, power      Data Reviewed: I have personally reviewed following labs and imaging studies CBC: Recent Labs  Lab 10/28/20 0139 10/29/20 0722 10/30/20 0107 10/31/20 0220 11/01/20 0244  WBC 6.9 6.4 7.2 7.0 6.0  NEUTROABS 4.1 3.6 3.8 3.8 3.0  HGB 10.4* 10.6* 9.7* 10.2* 9.4*  HCT 31.1* 31.8* 28.4* 29.4* 28.6*  MCV 100.0 99.4 98.3 96.4 99.3  PLT 218 221 229 250 947   Basic Metabolic Panel: Recent Labs  Lab 10/26/20 0253 10/27/20 0408 10/28/20 0139 10/29/20 0722 10/30/20 0107 10/31/20 0220 11/01/20 0244  NA 135   < > 134* 136 130* 133* 135  K 4.2   < > 4.2 3.6 4.0 3.4* 3.9  CL 99   < > 94* 96* 92* 92* 97*  CO2 30   < > 31 31 29 30 30   GLUCOSE 97   < > 94 93 99 106* 103*  BUN 15   < > 22 23 30* 32* 28*  CREATININE 1.24*   < > 1.23* 1.08* 1.19* 1.18* 1.25*  CALCIUM 9.2   < > 8.8* 9.1 8.7* 8.9 8.8*  MG 1.8  --  1.8  --   --   --   --    < > = values in this interval not displayed.   GFR: Estimated Creatinine Clearance: 31 mL/min (A) (by C-G formula based on SCr of 1.25 mg/dL (H)). Liver Function Tests: Recent Labs  Lab 10/26/20 0253 10/27/20 0408 10/28/20 0139  AST 10* 20 32  ALT 7 8 11   ALKPHOS 53 49 51  BILITOT 0.9 0.7 0.5  PROT 5.8* 6.3*  6.0*  ALBUMIN 2.6* 2.7* 2.5*   No results for input(s): LIPASE, AMYLASE in the last 168 hours. No results for input(s): AMMONIA in the last 168 hours. Coagulation Profile: No results for  input(s): INR, PROTIME in the last 168 hours. Cardiac Enzymes: Recent Labs  Lab 10/29/20 0722  CKTOTAL 130   BNP (last 3 results) No results for input(s): PROBNP in the last 8760 hours. HbA1C: No results for input(s): HGBA1C in the last 72 hours. CBG: No results for input(s): GLUCAP in the last 168 hours. Lipid Profile: No results for input(s): CHOL, HDL, LDLCALC, TRIG, CHOLHDL, LDLDIRECT in the last 72 hours. Thyroid Function Tests: No results for input(s): TSH, T4TOTAL, FREET4, T3FREE, THYROIDAB in the last 72 hours. Anemia Panel: No results for input(s): VITAMINB12, FOLATE, FERRITIN, TIBC, IRON, RETICCTPCT in the last 72 hours. Sepsis Labs: Recent Labs  Lab 10/27/20 0833  PROCALCITON 1.26    Recent Results (from the past 240 hour(s))  Blood culture (routine x 2)     Status: None   Collection Time: 10/23/20  2:10 PM   Specimen: BLOOD  Result Value Ref Range Status   Specimen Description BLOOD SITE NOT SPECIFIED  Final   Special Requests   Final    BOTTLES DRAWN AEROBIC AND ANAEROBIC Blood Culture adequate volume   Culture   Final    NO GROWTH 5 DAYS Performed at [facility], [address], [place], [place] [address]    Report Status 10/28/2020 FINAL  Final  Blood culture (routine x 2)     Status: None   Collection Time: 10/23/20  2:26 PM   Specimen: BLOOD  Result Value Ref Range Status   Specimen Description BLOOD SITE NOT SPECIFIED  Final   Special Requests   Final    BOTTLES DRAWN AEROBIC AND ANAEROBIC Blood Culture adequate volume   Culture   Final    NO GROWTH 5 DAYS Performed at [facility], [place] [address] [place], [place] [address]    Report Status 10/28/2020 FINAL  Final  Resp Panel by RT-PCR (Flu A&B, Covid) Nasopharyngeal Swab     Status: None   Collection Time: 10/27/20 10:54 AM   Specimen: Nasopharyngeal Swab; Nasopharyngeal(NP) swabs in vial transport medium  Result Value Ref Range Status   SARS Coronavirus 2 by RT PCR NEGATIVE  NEGATIVE Final    Comment: (NOTE) SARS-CoV-2 target nucleic acids are NOT DETECTED.  The SARS-CoV-2 RNA is generally detectable in upper respiratory specimens during the acute phase of infection. The lowest concentration of SARS-CoV-2 viral copies this assay can detect is 138 copies/mL. A negative result does not preclude SARS-Cov-2 infection and should not be used as the sole basis for treatment or other patient management decisions. A negative result may occur with  improper specimen collection/handling, submission of specimen other than nasopharyngeal swab, presence of viral mutation(s) within the areas targeted by this assay, and inadequate number of viral copies(<138 copies/mL). A negative result must be combined with clinical observations, patient history, and epidemiological information. The expected result is Negative.  Fact Sheet for Patients:  EntrepreneurPulse.com.au  Fact Sheet for Healthcare Providers:  IncredibleEmployment.be  This test is no t yet approved or cleared by the [place] FDA and  has been authorized for detection and/or diagnosis of SARS-CoV-2 by FDA under an Emergency Use Authorization (EUA). This EUA will remain  in effect (meaning this test can be used) for the duration of the COVID-19 declaration under Section 564(b)(1) of the Act,  21 U.S.C.section 360bbb-3(b)(1), unless the authorization is terminated  or revoked sooner.       Influenza A by PCR NEGATIVE NEGATIVE Final   Influenza B by PCR NEGATIVE NEGATIVE Final    Comment: (NOTE) The Xpert Xpress SARS-CoV-2/FLU/RSV plus assay is intended as an aid in the diagnosis of influenza from Nasopharyngeal swab specimens and should not be used as a sole basis for treatment. Nasal washings and aspirates are unacceptable for Xpert Xpress SARS-CoV-2/FLU/RSV testing.  Fact Sheet for Patients: EntrepreneurPulse.com.au  Fact Sheet for Healthcare  Providers: IncredibleEmployment.be  This test is not yet approved or cleared by the [place] FDA and has been authorized for detection and/or diagnosis of SARS-CoV-2 by FDA under an Emergency Use Authorization (EUA). This EUA will remain in effect (meaning this test can be used) for the duration of the COVID-19 declaration under Section 564(b)(1) of the Act, 21 U.S.C. section 360bbb-3(b)(1), unless the authorization is terminated or revoked.  Performed at [facility], [place] [address], [place], [place] [address]   Culture, blood (routine x 2)     Status: None   Collection Time: 10/27/20  2:00 PM   Specimen: BLOOD  Result Value Ref Range Status   Specimen Description BLOOD LEFT ANTECUBITAL  Final   Special Requests   Final    BOTTLES DRAWN AEROBIC AND ANAEROBIC Blood Culture adequate volume   Culture   Final    NO GROWTH 5 DAYS Performed at [facility], [place] [address], [place], [place] [address]    Report Status 11/01/2020 FINAL  Final  Culture, blood (routine x 2)     Status: Abnormal   Collection Time: 10/27/20  2:05 PM   Specimen: BLOOD  Result Value Ref Range Status   Specimen Description BLOOD RIGHT ANTECUBITAL  Final   Special Requests   Final    BOTTLES DRAWN AEROBIC AND ANAEROBIC Blood Culture adequate volume   Culture  Setup Time   Final    GRAM POSITIVE COCCI IN CLUSTERS AEROBIC BOTTLE ONLY CRITICAL RESULT CALLED TO, READ BACK BY AND VERIFIED WITH: PHARMD JEREMY FRENN AT 5456 ON 10/28/20 BY KJ Performed at [facility], [place] [address], [place], [place] [address]    Culture METHICILLIN RESISTANT STAPHYLOCOCCUS AUREUS (A)  Final   Report Status 10/30/2020 FINAL  Final   Organism ID, Bacteria METHICILLIN RESISTANT STAPHYLOCOCCUS AUREUS  Final      Susceptibility   Methicillin resistant staphylococcus aureus - MIC*    CIPROFLOXACIN >=8 RESISTANT Resistant     ERYTHROMYCIN >=8 RESISTANT Resistant     GENTAMICIN <=0.5 SENSITIVE  Sensitive     OXACILLIN >=4 RESISTANT Resistant     TETRACYCLINE <=1 SENSITIVE Sensitive     VANCOMYCIN <=0.5 SENSITIVE Sensitive     TRIMETH/SULFA <=10 SENSITIVE Sensitive     CLINDAMYCIN <=0.25 SENSITIVE Sensitive     RIFAMPIN <=0.5 SENSITIVE Sensitive     Inducible Clindamycin NEGATIVE Sensitive     * METHICILLIN RESISTANT STAPHYLOCOCCUS AUREUS  Blood Culture ID Panel (Reflexed)     Status: Abnormal   Collection Time: 10/27/20  2:05 PM  Result Value Ref Range Status   Enterococcus faecalis NOT DETECTED NOT DETECTED Final   Enterococcus Faecium NOT DETECTED NOT DETECTED Final   Listeria monocytogenes NOT DETECTED NOT DETECTED Final   Staphylococcus species DETECTED (A) NOT DETECTED Final    Comment: CRITICAL RESULT CALLED TO, READ BACK BY AND VERIFIED WITH: [place] ON 10/28/20  Staphylococcus aureus (BCID) DETECTED (A) NOT DETECTED Final    Comment: Methicillin (oxacillin)-resistant Staphylococcus aureus (MRSA). MRSA is predictably resistant to beta-lactam antibiotics (except ceftaroline). Preferred therapy is vancomycin unless clinically contraindicated. Patient requires contact precautions if  hospitalized. CRITICAL RESULT CALLED TO, READ BACK BY AND VERIFIED WITH: [place] AT 0919 ON 10/28/20 BY KJ    Staphylococcus epidermidis NOT DETECTED NOT DETECTED Final   Staphylococcus lugdunensis NOT DETECTED NOT DETECTED Final   Streptococcus species NOT DETECTED NOT DETECTED Final   Streptococcus agalactiae NOT DETECTED NOT DETECTED Final   Streptococcus pneumoniae NOT DETECTED NOT DETECTED Final   Streptococcus pyogenes NOT DETECTED NOT DETECTED Final   A.calcoaceticus-baumannii NOT DETECTED NOT DETECTED Final   Bacteroides fragilis NOT DETECTED NOT DETECTED Final   Enterobacterales NOT DETECTED NOT DETECTED Final   Enterobacter cloacae complex NOT DETECTED NOT DETECTED Final   Escherichia coli NOT DETECTED NOT DETECTED Final   Klebsiella  aerogenes NOT DETECTED NOT DETECTED Final   Klebsiella oxytoca NOT DETECTED NOT DETECTED Final   Klebsiella pneumoniae NOT DETECTED NOT DETECTED Final   Proteus species NOT DETECTED NOT DETECTED Final   Salmonella species NOT DETECTED NOT DETECTED Final   Serratia marcescens NOT DETECTED NOT DETECTED Final   Haemophilus influenzae NOT DETECTED NOT DETECTED Final   Neisseria meningitidis NOT DETECTED NOT DETECTED Final   Pseudomonas aeruginosa NOT DETECTED NOT DETECTED Final   Stenotrophomonas maltophilia NOT DETECTED NOT DETECTED Final   Candida albicans NOT DETECTED NOT DETECTED Final   Candida auris NOT DETECTED NOT DETECTED Final   Candida glabrata NOT DETECTED NOT DETECTED Final   Candida krusei NOT DETECTED NOT DETECTED Final   Candida parapsilosis NOT DETECTED NOT DETECTED Final   Candida tropicalis NOT DETECTED NOT DETECTED Final   Cryptococcus neoformans/gattii NOT DETECTED NOT DETECTED Final   Meth resistant mecA/C and MREJ DETECTED (A) NOT DETECTED Final    Comment: CRITICAL RESULT CALLED TO, READ BACK BY AND VERIFIED WITH: PHARMD JEREMY FRENN AT 8325 ON 10/28/20 BY KJ Performed at [facility], [address], [place], [place] [address]   Respiratory (~20 pathogens) panel by PCR     Status: None   Collection Time: 10/27/20  5:00 PM   Specimen: Nasopharyngeal Swab; Respiratory  Result Value Ref Range Status   Adenovirus NOT DETECTED NOT DETECTED Final   Coronavirus 229E NOT DETECTED NOT DETECTED Final    Comment: (NOTE) The Coronavirus on the Respiratory Panel, DOES NOT test for the novel  Coronavirus (2019 nCoV)    Coronavirus HKU1 NOT DETECTED NOT DETECTED Final   Coronavirus NL63 NOT DETECTED NOT DETECTED Final   Coronavirus OC43 NOT DETECTED NOT DETECTED Final   Metapneumovirus NOT DETECTED NOT DETECTED Final   Rhinovirus / Enterovirus NOT DETECTED NOT DETECTED Final   Influenza A NOT DETECTED NOT DETECTED Final   Influenza B NOT DETECTED NOT DETECTED Final    Parainfluenza Virus 1 NOT DETECTED NOT DETECTED Final   Parainfluenza Virus 2 NOT DETECTED NOT DETECTED Final   Parainfluenza Virus 3 NOT DETECTED NOT DETECTED Final   Parainfluenza Virus 4 NOT DETECTED NOT DETECTED Final   Respiratory Syncytial Virus NOT DETECTED NOT DETECTED Final   Bordetella pertussis NOT DETECTED NOT DETECTED Final   Bordetella Parapertussis NOT DETECTED NOT DETECTED Final   Chlamydophila pneumoniae NOT DETECTED NOT DETECTED Final   Mycoplasma pneumoniae NOT DETECTED NOT DETECTED Final    Comment: Performed at [facility], [place], [place], [place] [address]  Culture, blood (Routine X 2) w Reflex to ID Panel     Status: None (Preliminary result)   Collection Time: 10/29/20  2:56 PM   Specimen: BLOOD  Result Value Ref Range Status   Specimen Description BLOOD RIGHT ANTECUBITAL  Final   Special Requests   Final    BOTTLES DRAWN AEROBIC AND ANAEROBIC Blood Culture adequate volume   Culture   Final    NO GROWTH 3 DAYS Performed at [facility], [address], [place], [place] [address]    Report Status PENDING  Incomplete  Culture, blood (Routine X 2) w Reflex to ID Panel     Status: None (Preliminary result)   Collection Time: 10/29/20  2:57 PM   Specimen: BLOOD  Result Value Ref Range Status   Specimen Description BLOOD LEFT ANTECUBITAL  Final   Special Requests   Final    BOTTLES DRAWN AEROBIC AND ANAEROBIC Blood Culture adequate volume   Culture   Final    NO GROWTH 3 DAYS Performed at [facility], [place] [address] [place], [place] [address]    Report Status PENDING  Incomplete  Culture, blood (Routine X 2) w Reflex to ID Panel     Status: None (Preliminary result)   Collection Time: 10/30/20 10:01 AM   Specimen: BLOOD RIGHT HAND  Result Value Ref Range Status   Specimen Description BLOOD RIGHT HAND  Final   Special Requests   Final    BOTTLES DRAWN AEROBIC ONLY Blood Culture adequate volume   Culture   Final    NO GROWTH 2  DAYS Performed at [facility], [place] [address] [place], [place] [address]    Report Status PENDING  Incomplete  Culture, blood (Routine X 2) w Reflex to ID Panel     Status: None (Preliminary result)   Collection Time: 10/30/20 10:12 AM   Specimen: BLOOD  Result Value Ref Range Status   Specimen Description BLOOD LEFT ANTECUBITAL  Final   Special Requests   Final    BOTTLES DRAWN AEROBIC ONLY Blood Culture results may not be optimal due to an inadequate volume of blood received in culture bottles   Culture   Final    NO GROWTH 2 DAYS Performed at [facility], [place] [address] [place], [place] [address]    Report Status PENDING  Incomplete     Radiology Studies: No results found.   LOS: 9 days   Antonieta Pert, MD [facility]  11/01/2020, 10:08 AM

## 2020-11-01 NOTE — Progress Notes (Signed)
Diagnosis: MRSA bacteremia complicated   Culture Result: MRSA  Allergies  Allergen Reactions  . Statins   . Gabapentin Itching    Was deleted since patient was taking, but now experiencing itching again.   . Statins Swelling, Rash and Other (See Comments)    Swelling involving tongue; also causes muscle pain    OPAT Orders Discharge antibiotics to be given via PICC line Discharge antibiotics: daptomycin  Per pharmacy protocol   Duration: 4 weeks  End Date: 11/26/20  [facility] Care Per Protocol:  Home health RN for IV administration and teaching; PICC line care and labs.    Labs weekly while on IV antibiotics: X__ CBC with differential X__ BMP __ CMP __ CRP __ ESR __ Vancomycin trough X__ CK  X__ Please pull PIC at completion of IV antibiotics __ Please leave PIC in place until doctor has seen patient or been notified  Fax weekly labs to [phone]  Clinic Follow Up Appt: 11/12/20  @ 3:30 pm

## 2020-11-01 NOTE — CV Procedure (Signed)
    PROCEDURE NOTE:  Procedure:  Transesophageal echocardiogram Operator:  Fransico Him, MD Indications:  MRSA bacteremia Complications: None  During this procedure the patient is administered a total of Propofol 130 mg to achieve and maintain moderate conscious sedation.  The patient's heart rate, blood pressure, and oxygen saturation are monitored continuously during the procedure by anesthesia.  Results: Normal LV size and function with EF 60-65% Normal RV size and function Normal RA Normal LA and normal LA appendage with no thrombus Normal TV with mild TR Normal PV with trivial PR Normal MV with mild MR Trileaflet AV with mild AVSC Normal interatrial septum with no evidence of shunt by colorflow dopper  Mild atheromatous plaque in the thoracic aorta and ascending aorta No evidence of vegetations.   The patient tolerated the procedure well and was transferred back to their room in stable condition.  Signed: Fransico Him, MD [facility]

## 2020-11-01 NOTE — Transfer of Care (Signed)
Immediate Anesthesia Transfer of Care Note  Patient: [name] [name]  Procedure(s) Performed: TRANSESOPHAGEAL ECHOCARDIOGRAM (TEE) (N/A )  Patient Location: Endoscopy Unit  Anesthesia Type:MAC  Level of Consciousness: drowsy and patient cooperative  Airway & Oxygen Therapy: Patient Spontanous Breathing and Patient connected to nasal cannula oxygen  Post-op Assessment: Report given to RN and Post -op Vital signs reviewed and stable  Post vital signs: Reviewed and stable  Last Vitals:  Vitals Value Taken Time  BP 112/79 11/01/20 0811  Temp    Pulse 101 11/01/20 0813  Resp 12 11/01/20 0813  SpO2 99 % 11/01/20 0813  Vitals shown include unvalidated device data.  Last Pain:  Vitals:   11/01/20 0711  TempSrc: Oral  PainSc: 0-No pain      Patients Stated Pain Goal: 0 (97/74/14 2395)  Complications: No complications documented.

## 2020-11-01 NOTE — Anesthesia Postprocedure Evaluation (Signed)
Anesthesia Post Note  Patient: [name] [name]  Procedure(s) Performed: TRANSESOPHAGEAL ECHOCARDIOGRAM (TEE) (N/A )     Patient location during evaluation: PACU Anesthesia Type: MAC Level of consciousness: awake and alert and oriented Pain management: pain level controlled Vital Signs Assessment: post-procedure vital signs reviewed and stable Respiratory status: spontaneous breathing, nonlabored ventilation and respiratory function stable Cardiovascular status: blood pressure returned to baseline Postop Assessment: no apparent nausea or vomiting Anesthetic complications: no   No complications documented.  Last Vitals:  Vitals:   11/01/20 0821 11/01/20 0841  BP: (!) 131/56 (!) 112/51  Pulse: 97 90  Resp: 19 20  Temp:  36.5 C  SpO2: 93% 95%    Last Pain:  Vitals:   11/01/20 0841  TempSrc: Oral  PainSc:                  Brennan [name]

## 2020-11-01 NOTE — Progress Notes (Signed)
PHARMACY CONSULT NOTE FOR:  OUTPATIENT  PARENTERAL ANTIBIOTIC THERAPY (OPAT)  Indication: MRSA bacteremia  Regimen: Daptomycin 500 mg IV Q 24 hours  End date: 11/26/20  IV antibiotic discharge orders are pended. To discharging provider:  please sign these orders via discharge navigator,  Select New Orders & click on the button choice - Manage This Unsigned Work.     Thank you for allowing pharmacy to be a part of this patient's care.  Jimmy Footman, PharmD, BCPS, BCIDP Infectious Diseases Clinical Pharmacist Phone: [phone] 11/01/2020, 1:42 PM

## 2020-11-01 NOTE — Progress Notes (Signed)
Foley catheter removed per MD

## 2020-11-01 NOTE — Anesthesia Procedure Notes (Signed)
Procedure Name: MAC Date/Time: 11/01/2020 7:47 AM Performed by: Amadeo Garnet, CRNA Pre-anesthesia Checklist: Patient identified, Emergency Drugs available, Suction available and Patient being monitored Patient Re-evaluated:Patient Re-evaluated prior to induction Oxygen Delivery Method: Nasal cannula Preoxygenation: Pre-oxygenation with 100% oxygen Induction Type: IV induction Placement Confirmation: positive ETCO2 Dental Injury: Teeth and Oropharynx as per pre-operative assessment

## 2020-11-01 NOTE — Progress Notes (Signed)
Physical Therapy Treatment Patient Details Name: [name] [name] MRN: [id number] DOB: Mar 07, 1941 Today's Date: 11/01/2020    History of Present Illness 80 y/o female who was sent to ED on 10/23/20 after her home health nurse noticed hypoxia and an irregular HR. Pt admitted due to SOB with further work up pending. Multiple lesions bil LEs; +MRSA. Pt with recent admission on 09/01/20 for sepsis secondary to PNA. PMH includes CKD, congenital solitary kideny, HTN, and HLD.    PT Comments    Patient noted to have been incontinent of bowels in bed and was unaware. Assisted pt with cleaning as assisted OOB. Patient primarily limited in her mobility due to bil LE pain. Requires 1 person assist to ambulate 4 ft from bed to chair and reports she can do no more than this due to LE discomfort.      Follow Up Recommendations  SNF     Equipment Recommendations  None recommended by PT    Recommendations for Other Services       Precautions / Restrictions Precautions Precautions: Fall    Mobility  Bed Mobility Overal bed mobility: Needs Assistance Bed Mobility: Rolling;Sidelying to Sit Rolling: Supervision Sidelying to sit: Min guard;HOB elevated       General bed mobility comments: attempting supine to sit wiht much effort; cued for rolling and then side to sit and did not need assistance    Transfers Overall transfer level: Needs assistance Equipment used: Rolling walker (2 wheeled) Transfers: Sit to/from [facility] Sit to Stand: Min assist         General transfer comment: from EOB min assist to boost to standing; vc for bil foot placment prior to transfer  Ambulation/Gait Ambulation/Gait assistance: Min guard Gait Distance (Feet): 4 Feet Assistive device: Rolling walker (2 wheeled) Gait Pattern/deviations: Shuffle Gait velocity: slow       Stairs             Wheelchair Mobility    Modified Rankin (Stroke Patients Only)       Balance Overall  balance assessment: Needs assistance Sitting-balance support: Feet supported;Single extremity supported Sitting balance-Leahy Scale: Fair     Standing balance support: Bilateral upper extremity supported;During functional activity Standing balance-Leahy Scale: Poor Standing balance comment: requires UE support                            Cognition Arousal/Alertness: Awake/alert Behavior During Therapy: WFL for tasks assessed/performed Overall Cognitive Status: No family/caregiver present to determine baseline cognitive functioning                                 General Comments: slightly slow to process instructions and then carry them through      Exercises General Exercises - Lower Extremity Ankle Circles/Pumps: AROM;Both;10 reps Long Arc Quad: AROM;Both;Seated;10 reps    General Comments        Pertinent Vitals/Pain Pain Assessment: Faces Faces Pain Scale: Hurts little more Pain Location: LE wounds Pain Descriptors / Indicators: Grimacing;Tender Pain Intervention(s): Limited activity within patient's tolerance    Home Living                      Prior Function            PT Goals (current goals can now be found in the care plan section) Acute Rehab PT Goals Patient Stated Goal: hurt  less and go home Time For Goal Achievement: 11/07/20 Potential to Achieve Goals: Good Progress towards PT goals: Progressing toward goals    Frequency    Min 2X/week      PT Plan Current plan remains appropriate;Frequency needs to be updated    Co-evaluation              AM-PAC PT "6 Clicks" Mobility   Outcome Measure  Help needed turning from your back to your side while in a flat bed without using bedrails?: A Lot Help needed moving from lying on your back to sitting on the side of a flat bed without using bedrails?: A Lot Help needed moving to and from a bed to a chair (including a wheelchair)?: A Lot Help needed standing up  from a chair using your arms (e.g., wheelchair or bedside chair)?: A Little Help needed to walk in hospital room?: A Little Help needed climbing 3-5 steps with a railing? : Total 6 Click Score: 13    End of Session Equipment Utilized During Treatment: Gait belt Activity Tolerance: Patient limited by fatigue Patient left: with call bell/phone within reach;in chair;with chair alarm set Nurse Communication: Mobility status PT Visit Diagnosis: Unsteadiness on feet (R26.81);Muscle weakness (generalized) (M62.81);Difficulty in walking, not elsewhere classified (R26.2)     Time: 3300-7622 PT Time Calculation (min) (ACUTE ONLY): 23 min  Charges:  $Gait Training: 8-22 mins $Therapeutic Exercise: 8-22 mins                      [name], PT Pager [phone]    [name] 11/01/2020, 12:54 PM

## 2020-11-01 NOTE — Progress Notes (Signed)
  Echocardiogram Echocardiogram Transesophageal has been performed.  [name] [name] 11/01/2020, 8:34 AM

## 2020-11-01 NOTE — Interval H&P Note (Signed)
History and Physical Interval Note:  11/01/2020 7:24 AM  [name] [name]  has presented today for surgery, with the diagnosis of bacteremia.  The various methods of treatment have been discussed with the patient and family. After consideration of risks, benefits and other options for treatment, the patient has consented to  Procedure(s): TRANSESOPHAGEAL ECHOCARDIOGRAM (TEE) (N/A) as a surgical intervention.  The patient's history has been reviewed, patient examined, no change in status, stable for surgery.  I have reviewed the patient's chart and labs.  Questions were answered to the patient's satisfaction.     Fransico Him
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
## 2020-11-02 ENCOUNTER — Encounter (HOSPITAL_COMMUNITY): Payer: Self-pay | Admitting: Cardiology

## 2020-11-02 DIAGNOSIS — B9562 Methicillin resistant Staphylococcus aureus infection as the cause of diseases classified elsewhere: Secondary | ICD-10-CM | POA: Diagnosis not present

## 2020-11-02 DIAGNOSIS — R7881 Bacteremia: Secondary | ICD-10-CM | POA: Diagnosis not present

## 2020-11-02 LAB — CBC WITH DIFFERENTIAL/PLATELET
Abs Immature Granulocytes: 0.04 10*3/uL (ref 0.00–0.07)
Basophils Absolute: 0 10*3/uL (ref 0.0–0.1)
Basophils Relative: 0 %
Eosinophils Absolute: 0.5 10*3/uL (ref 0.0–0.5)
Eosinophils Relative: 10 %
HCT: 27.7 % — ABNORMAL LOW (ref 36.0–46.0)
Hemoglobin: 9.2 g/dL — ABNORMAL LOW (ref 12.0–15.0)
Immature Granulocytes: 1 %
Lymphocytes Relative: 30 %
Lymphs Abs: 1.6 10*3/uL (ref 0.7–4.0)
MCH: 33 pg (ref 26.0–34.0)
MCHC: 33.2 g/dL (ref 30.0–36.0)
MCV: 99.3 fL (ref 80.0–100.0)
Monocytes Absolute: 0.6 10*3/uL (ref 0.1–1.0)
Monocytes Relative: 12 %
Neutro Abs: 2.5 10*3/uL (ref 1.7–7.7)
Neutrophils Relative %: 47 %
Platelets: 254 10*3/uL (ref 150–400)
RBC: 2.79 MIL/uL — ABNORMAL LOW (ref 3.87–5.11)
RDW: 14.5 % (ref 11.5–15.5)
WBC: 5.3 10*3/uL (ref 4.0–10.5)
nRBC: 0 % (ref 0.0–0.2)

## 2020-11-02 LAB — BASIC METABOLIC PANEL
Anion gap: 7 (ref 5–15)
BUN: 20 mg/dL (ref 8–23)
CO2: 29 mmol/L (ref 22–32)
Calcium: 9 mg/dL (ref 8.9–10.3)
Chloride: 102 mmol/L (ref 98–111)
Creatinine, Ser: 0.92 mg/dL (ref 0.44–1.00)
GFR, Estimated: >60 mL/min (ref >60)
Glucose, Bld: 95 mg/dL (ref 70–99)
Potassium: 3.8 mmol/L (ref 3.5–5.1)
Sodium: 138 mmol/L (ref 135–145)

## 2020-11-02 MED ORDER — AMLODIPINE BESYLATE 2.5 MG PO TABS
2.5000 mg | ORAL_TABLET | Freq: Every day | ORAL | Status: DC
  Administered 2020-11-02 – 2020-11-05 (×4): 2.5 mg via ORAL
  Filled 2020-11-02 (×4): qty 1

## 2020-11-02 MED ORDER — SODIUM CHLORIDE 0.9% FLUSH
10.0000 mL | Freq: Two times a day (BID) | INTRAVENOUS | Status: DC
  Administered 2020-11-02 – 2020-11-03 (×2): 10 mL
  Administered 2020-11-03: 20 mL
  Administered 2020-11-04 – 2020-11-05 (×3): 10 mL

## 2020-11-02 MED ORDER — SODIUM CHLORIDE 0.9% FLUSH
10.0000 mL | INTRAVENOUS | Status: DC | PRN
  Administered 2020-11-02: 10 mL

## 2020-11-02 NOTE — Plan of Care (Signed)
  Problem: Activity: Goal: Ability to tolerate increased activity will improve Outcome: Progressing   

## 2020-11-02 NOTE — Plan of Care (Signed)
  Problem: Activity: Goal: Ability to tolerate increased activity will improve Outcome: Progressing   Problem: Clinical Measurements: Goal: Ability to maintain a body temperature in the normal range will improve Outcome: Progressing   Problem: Respiratory: Goal: Ability to maintain adequate ventilation will improve Outcome: Progressing Goal: Ability to maintain a clear airway will improve Outcome: Progressing   

## 2020-11-02 NOTE — Progress Notes (Addendum)
PROGRESS NOTE    [name] [name]  BSJ:[id number] DOB: 30-May-1941 DOA: 10/23/2020 PCP: [name], MD   Chief Complaint  Patient presents with  . Hypoxia  Brief Narrative: 80 year old female with CKD stage IIIb, congenital solitary kidney hypertension hyperlipidemia PVD presented with generalized weakness, shortness of breath.  In the ED chest x-ray showed resolution of previously seen right basilar pneumonia, underwent further work-up with VQ scan but could not be completed and unable to get CT angio due to elevated creatinine, Doppler of the leg negative for DVT. Atenolol 1 further evaluation on 4/18 with CT angiography due to chest pain and PE was ruled out.  Patient still having low-grade fever, blood culture on admission were negative UA unremarkable no pneumonia on CT chest procalcitonin ESR and CRP were elevated concern for ongoing infection underwent CT abdomen pelvis no acute pathology found.  Repeat blood culture on 4/20 grew 1 out of 2 bottles with MRSA, ID was consulted.  [facility] Hospitalization: 09/01/2020 to 09/10/2020:Sepsis with acute hypoxic respiratory failure due to pneumonia and altered mental status. She was treated with 5 days of antibiotics with MRSA coverage and was discharged to a SNF from IM teaching service. Patient was managed for MRSA bacteremia TEE negative ID advised daptomycin x4-week PICC line being placed 4/26.  Subjective: Seen this morning no new complaints resting comfortably.  Would like to pursue skilled nursing facility.   PICC nurse in the room.  Assessment & Plan:  MRSA bacteremia:She has had low-grade fever-that prompted further work-up and blood culture that was positive for 1 out of 2 MRSA.Unknown source.ID on board unremarkable TEE plan is to continue daptomycin x 4wk as per ID monitor CK routine labs.Repeat blood culture 4/22-23,no growth so far.Duration: 4 weeks,End Date: 11/26/20   Acute hypoxic respiratory failure/elevated D-dimer:currently on room  air no PE on CT angio, no DVT on Doppler.  Negative for COVID and respiratory virus panel.Discontinued Lasix.  LLE wounds/stasis dermatitis of lower extremities: Continue local wound care.  Stop IV Lasix today.  Epistaxis, transient.  No recurrence  Hyponatremia mild improved.  Hypokalemia 3.4 resolved.  AKI on CKD stage IIIb, baseline creatinine~1.0, creatinine improved to 0.9.  Off Lasix.   Recent Labs  Lab 10/29/20 0722 10/30/20 0107 10/31/20 0220 11/01/20 0244 11/02/20 0402  BUN 23 30* 32* 28* 20  CREATININE 1.08* 1.19* 1.18* 1.25* 0.92   Urine retention continue Flomax, off Foley catheter, monitor bladder scan.    Microcytic anemia likely in the setting of CKD/chronic disease remains a stable 9 to 10 g.  Continue iron and B12 supplementation  Generalized weakness/failure to thrive: In the setting of multiple comorbidities, active infection.  TOC consulted, will need SNF placement.    PMR: Has a diagnosis of documented PMR, RA. Apparently patient not aware of diagnosis and daughter also not aware. ESR CRP ANA RF positive.  Patient without headache, tenderness on temporal area -giant cell arteritis not suspected.  Patient was mostly having generalized body aches and pain, Dr Doristine Bosworth discussed with ID regarding steroids and currently holding 2/2 infection.  She will need close follow-up with PCP/rheumatology as outpatient.  Hypomagnesemia:Resolved  Right lower lobe nodule 7 mm radiology advises noncontrast CT chest in 6 to 12 months 2 cm left thyroid nodule outpatient ultrasound of the thyroid will be needed-follow-up with PCP  Essential hypertension: BP was soft amlodipine was held yesterday will cut down to 2.5 mg.    PVD history of iliac stent angioplasty: Continue her aspirin, Plavix.  GERD continue  PPI  Anxiety/depression: Mood stable, continue BuSpar, Depakote and Remeron.  Lumbar radiculopathy continue pain meds  Morbid obesity BMI 30.6 will benefit with weight  loss outpatient PCP follow-up  Diet Order            Diet Heart Room service appropriate? No; Fluid consistency: Thin  Diet effective now               Patient's Body mass index is 30.61 kg/m.  DVT prophylaxis: enoxaparin (LOVENOX) injection 40 mg Start: 11/02/20 0800 SCDs Start: 10/23/20 1917 Code Status:   Code Status: DNR  Family Communication: plan of care discussed with patient at bedside. I had called her daughter [name]- POA-no answer. Patient reports she has been talking with her family Not able to reach [place] again- but called and updated another daughter [name].  Status is: Inpatient Remains inpatient appropriate because:Ongoing active pain requiring inpatient pain management and Inpatient level of care appropriate due to severity of illness  Dispo: The patient is from: Home, with home health nurse              Anticipated d/c is to: SNF once bed available.  Hopefully later today or tomorrow               Patient currently is not medically stable to d/c.   Difficult to place patient No    Unresulted Labs (From admission, onward)          Start     Ordered   10/29/20 0500  CK  Weekly,   R     Question:  Specimen collection method  Answer:  Lab=Lab collect   10/28/20 1515   10/26/20 0500  CBC with Differential/Platelet  Daily,   R     Question:  Specimen collection method  Answer:  Lab=Lab collect   10/25/20 1225          Medications reviewed:  Scheduled Meds: . amLODipine  5 mg Oral Daily  . aspirin  81 mg Oral Daily  . busPIRone  15 mg Oral TID  . Chlorhexidine Gluconate Cloth  6 each Topical Daily  . cholecalciferol  1,000 Units Oral Daily  . clopidogrel  75 mg Oral q morning  . divalproex  125 mg Oral BID  . dorzolamide  1 drop Both Eyes BID   And  . timolol  1 drop Both Eyes BID  . enoxaparin (LOVENOX) injection  40 mg Subcutaneous Q24H  . ferrous sulfate  325 mg Oral Daily  . gabapentin  100 mg Oral TID  . latanoprost  1 drop Both Eyes QHS   . mirtazapine  15 mg Oral QHS  . pantoprazole  40 mg Oral Daily  . polyethylene glycol  17 g Oral Daily  . tamsulosin  0.4 mg Oral Daily  . vitamin B-12  100 mcg Oral Daily   Continuous Infusions: . DAPTOmycin (CUBICIN)  IV 500 mg (11/01/20 2050)  . lactated ringers 125 mL/hr at 11/01/20 1754    Consultants: ID Cardiology   Procedures:see note  Antimicrobials: Anti-infectives (From admission, onward)   Start     Dose/Rate Route Frequency Ordered Stop   10/28/20 1030  DAPTOmycin (CUBICIN) 500 mg in sodium chloride 0.9 % IVPB        500 mg 120 mL/hr over 30 Minutes Intravenous Daily 10/28/20 0930 11/26/20 2359     Culture/Microbiology    Component Value Date/Time   SDES BLOOD LEFT ANTECUBITAL 10/30/2020 1012   SPECREQUEST  10/30/2020 1012  BOTTLES DRAWN AEROBIC ONLY Blood Culture results may not be optimal due to an inadequate volume of blood received in culture bottles   CULT  10/30/2020 1012    NO GROWTH 3 DAYS Performed at [facility] [address], [place], [place] [address]    REPTSTATUS PENDING 10/30/2020 1012    Other culture-see note  Objective: Vitals: Today's Vitals   11/01/20 1910 11/01/20 2015 11/01/20 2130 11/02/20 0754  BP:  (!) 122/56    Pulse:  86    Resp:  18    Temp:  98.5 F (36.9 C)    TempSrc:  Oral    SpO2:  93%    Weight:      Height:      PainSc: 0-No pain  Asleep 0-No pain    Intake/Output Summary (Last 24 hours) at 11/02/2020 0946 Last data filed at 11/02/2020 7414 Gross per 24 hour  Intake 240 ml  Output 3125 ml  Net -2885 ml   Filed Weights   10/23/20 2040 11/01/20 0711  Weight: 71.1 kg 71.1 kg   Weight change:   Intake/Output from previous day: 04/25 0701 - 04/26 0700 In: 640 [P.O.:240; I.V.:400] Out: 3125 [Urine:3125] Intake/Output this shift: No intake/output data recorded. Filed Weights   10/23/20 2040 11/01/20 0711  Weight: 71.1 kg 71.1 kg    Examination: General exam: AAOx3, NAD,NAD, weak  appearing. HEENT:Oral mucosa moist, Ear/Nose WNL grossly, dentition normal. Respiratory system: bilaterally clear,no wheezing or crackles,no use of accessory muscle Cardiovascular system: S1 & S2 +, No JVD,. Gastrointestinal system: Abdomen soft, NT,ND, BS+ Nervous System:Alert, awake, moving extremities and grossly nonfocal Extremities: No edema, distal peripheral pulses palpable.  Skin: No rashes,no icterus.  Lower extremity with wound dressing in place. MSK: Normal muscle bulk,tone, power  Data Reviewed: I have personally reviewed following labs and imaging studies CBC: Recent Labs  Lab 10/29/20 0722 10/30/20 0107 10/31/20 0220 11/01/20 0244 11/02/20 0402  WBC 6.4 7.2 7.0 6.0 5.3  NEUTROABS 3.6 3.8 3.8 3.0 2.5  HGB 10.6* 9.7* 10.2* 9.4* 9.2*  HCT 31.8* 28.4* 29.4* 28.6* 27.7*  MCV 99.4 98.3 96.4 99.3 99.3  PLT 221 229 250 257 239   Basic Metabolic Panel: Recent Labs  Lab 10/28/20 0139 10/29/20 0722 10/30/20 0107 10/31/20 0220 11/01/20 0244 11/02/20 0402  NA 134* 136 130* 133* 135 138  K 4.2 3.6 4.0 3.4* 3.9 3.8  CL 94* 96* 92* 92* 97* 102  CO2 _0 GLUCOSE 94 93 99 106* 103* 95  BUN 22 23 30* 32* 28* 20  CREATININE 1.23* 1.08* 1.19* 1.18* 1.25* 0.92  CALCIUM 8.8* 9.1 8.7* 8.9 8.8* 9.0  MG 1.8  --   --   --   --   --    GFR: Estimated Creatinine Clearance: 42.2 mL/min (by C-G formula based on SCr of 0.92 mg/dL). Liver Function Tests: Recent Labs  Lab 10/27/20 0408 10/28/20 0139  AST 20 32  ALT 8 11  ALKPHOS 49 51  BILITOT 0.7 0.5  PROT 6.3* 6.0*  ALBUMIN 2.7* 2.5*   No results for input(s): LIPASE, AMYLASE in the last 168 hours. No results for input(s): AMMONIA in the last 168 hours. Coagulation Profile: No results for input(s): INR, PROTIME in the last 168 hours. Cardiac Enzymes: Recent Labs  Lab 10/29/20 0722  CKTOTAL 130   BNP (last 3 results) No results for input(s): PROBNP in the last 8760 hours. HbA1C: No results for  input(s): HGBA1C in  the last 72 hours. CBG: No results for input(s): GLUCAP in the last 168 hours. Lipid Profile: No results for input(s): CHOL, HDL, LDLCALC, TRIG, CHOLHDL, LDLDIRECT in the last 72 hours. Thyroid Function Tests: No results for input(s): TSH, T4TOTAL, FREET4, T3FREE, THYROIDAB in the last 72 hours. Anemia Panel: No results for input(s): VITAMINB12, FOLATE, FERRITIN, TIBC, IRON, RETICCTPCT in the last 72 hours. Sepsis Labs: Recent Labs  Lab 10/27/20 0833  PROCALCITON 1.26    Recent Results (from the past 240 hour(s))  Blood culture (routine x 2)     Status: None   Collection Time: 10/23/20  2:10 PM   Specimen: BLOOD  Result Value Ref Range Status   Specimen Description BLOOD SITE NOT SPECIFIED  Final   Special Requests   Final    BOTTLES DRAWN AEROBIC AND ANAEROBIC Blood Culture adequate volume   Culture   Final    NO GROWTH 5 DAYS Performed at [facility], [address], [place], [place] [address]    Report Status 10/28/2020 FINAL  Final  Blood culture (routine x 2)     Status: None   Collection Time: 10/23/20  2:26 PM   Specimen: BLOOD  Result Value Ref Range Status   Specimen Description BLOOD SITE NOT SPECIFIED  Final   Special Requests   Final    BOTTLES DRAWN AEROBIC AND ANAEROBIC Blood Culture adequate volume   Culture   Final    NO GROWTH 5 DAYS Performed at [facility], [place] [address], [place], [place] [address]    Report Status 10/28/2020 FINAL  Final  Resp Panel by RT-PCR (Flu A&B, Covid) Nasopharyngeal Swab     Status: None   Collection Time: 10/27/20 10:54 AM   Specimen: Nasopharyngeal Swab; Nasopharyngeal(NP) swabs in vial transport medium  Result Value Ref Range Status   SARS Coronavirus 2 by RT PCR NEGATIVE NEGATIVE Final    Comment: (NOTE) SARS-CoV-2 target nucleic acids are NOT DETECTED.  The SARS-CoV-2 RNA is generally detectable in upper respiratory specimens during the acute phase of infection. The  lowest concentration of SARS-CoV-2 viral copies this assay can detect is 138 copies/mL. A negative result does not preclude SARS-Cov-2 infection and should not be used as the sole basis for treatment or other patient management decisions. A negative result may occur with  improper specimen collection/handling, submission of specimen other than nasopharyngeal swab, presence of viral mutation(s) within the areas targeted by this assay, and inadequate number of viral copies(<138 copies/mL). A negative result must be combined with clinical observations, patient history, and epidemiological information. The expected result is Negative.  Fact Sheet for Patients:  EntrepreneurPulse.com.au  Fact Sheet for Healthcare Providers:  IncredibleEmployment.be  This test is no t yet approved or cleared by the [place] FDA and  has been authorized for detection and/or diagnosis of SARS-CoV-2 by FDA under an Emergency Use Authorization (EUA). This EUA will remain  in effect (meaning this test can be used) for the duration of the COVID-19 declaration under Section 564(b)(1) of the Act, 21 U.S.C.section 360bbb-3(b)(1), unless the authorization is terminated  or revoked sooner.       Influenza A by PCR NEGATIVE NEGATIVE Final   Influenza B by PCR NEGATIVE NEGATIVE Final    Comment: (NOTE) The Xpert Xpress SARS-CoV-2/FLU/RSV plus assay is intended as an aid in the diagnosis of influenza from Nasopharyngeal swab specimens and should not be used as a sole basis for treatment. Nasal washings and aspirates are unacceptable for  Xpert Xpress SARS-CoV-2/FLU/RSV testing.  Fact Sheet for Patients: EntrepreneurPulse.com.au  Fact Sheet for Healthcare Providers: IncredibleEmployment.be  This test is not yet approved or cleared by the [place] FDA and has been authorized for detection and/or diagnosis of SARS-CoV-2 by FDA under  an Emergency Use Authorization (EUA). This EUA will remain in effect (meaning this test can be used) for the duration of the COVID-19 declaration under Section 564(b)(1) of the Act, 21 U.S.C. section 360bbb-3(b)(1), unless the authorization is terminated or revoked.  Performed at [facility], [place] [address] [place], [place] [address]   Culture, blood (routine x 2)     Status: None   Collection Time: 10/27/20  2:00 PM   Specimen: BLOOD  Result Value Ref Range Status   Specimen Description BLOOD LEFT ANTECUBITAL  Final   Special Requests   Final    BOTTLES DRAWN AEROBIC AND ANAEROBIC Blood Culture adequate volume   Culture   Final    NO GROWTH 5 DAYS Performed at [facility], [place] [address] [place], [place] [address]    Report Status 11/01/2020 FINAL  Final  Culture, blood (routine x 2)     Status: Abnormal   Collection Time: 10/27/20  2:05 PM   Specimen: BLOOD  Result Value Ref Range Status   Specimen Description BLOOD RIGHT ANTECUBITAL  Final   Special Requests   Final    BOTTLES DRAWN AEROBIC AND ANAEROBIC Blood Culture adequate volume   Culture  Setup Time   Final    GRAM POSITIVE COCCI IN CLUSTERS AEROBIC BOTTLE ONLY CRITICAL RESULT CALLED TO, READ BACK BY AND VERIFIED WITH: PHARMD JEREMY FRENN AT 2671 ON 10/28/20 BY KJ Performed at [facility], [place] [address] [place], [place] [address]    Culture METHICILLIN RESISTANT STAPHYLOCOCCUS AUREUS (A)  Final   Report Status 10/30/2020 FINAL  Final   Organism ID, Bacteria METHICILLIN RESISTANT STAPHYLOCOCCUS AUREUS  Final      Susceptibility   Methicillin resistant staphylococcus aureus - MIC*    CIPROFLOXACIN >=8 RESISTANT Resistant     ERYTHROMYCIN >=8 RESISTANT Resistant     GENTAMICIN <=0.5 SENSITIVE Sensitive     OXACILLIN >=4 RESISTANT Resistant     TETRACYCLINE <=1 SENSITIVE Sensitive     VANCOMYCIN <=0.5 SENSITIVE Sensitive     TRIMETH/SULFA <=10 SENSITIVE Sensitive     CLINDAMYCIN <=0.25  SENSITIVE Sensitive     RIFAMPIN <=0.5 SENSITIVE Sensitive     Inducible Clindamycin NEGATIVE Sensitive     * METHICILLIN RESISTANT STAPHYLOCOCCUS AUREUS  Blood Culture ID Panel (Reflexed)     Status: Abnormal   Collection Time: 10/27/20  2:05 PM  Result Value Ref Range Status   Enterococcus faecalis NOT DETECTED NOT DETECTED Final   Enterococcus Faecium NOT DETECTED NOT DETECTED Final   Listeria monocytogenes NOT DETECTED NOT DETECTED Final   Staphylococcus species DETECTED (A) NOT DETECTED Final    Comment: CRITICAL RESULT CALLED TO, READ BACK BY AND VERIFIED WITH: PHARMD JEREMY FRENN AT 0919 BY KJ ON 10/28/20    Staphylococcus aureus (BCID) DETECTED (A) NOT DETECTED Final    Comment: Methicillin (oxacillin)-resistant Staphylococcus aureus (MRSA). MRSA is predictably resistant to beta-lactam antibiotics (except ceftaroline). Preferred therapy is vancomycin unless clinically contraindicated. Patient requires contact precautions if  hospitalized. CRITICAL RESULT CALLED TO, READ BACK BY AND VERIFIED WITH: PHARMD JEREMY FRENN AT 0919 ON 10/28/20 BY KJ    Staphylococcus epidermidis NOT DETECTED NOT DETECTED Final   Staphylococcus lugdunensis NOT DETECTED  NOT DETECTED Final   Streptococcus species NOT DETECTED NOT DETECTED Final   Streptococcus agalactiae NOT DETECTED NOT DETECTED Final   Streptococcus pneumoniae NOT DETECTED NOT DETECTED Final   Streptococcus pyogenes NOT DETECTED NOT DETECTED Final   A.calcoaceticus-baumannii NOT DETECTED NOT DETECTED Final   Bacteroides fragilis NOT DETECTED NOT DETECTED Final   Enterobacterales NOT DETECTED NOT DETECTED Final   Enterobacter cloacae complex NOT DETECTED NOT DETECTED Final   Escherichia coli NOT DETECTED NOT DETECTED Final   Klebsiella aerogenes NOT DETECTED NOT DETECTED Final   Klebsiella oxytoca NOT DETECTED NOT DETECTED Final   Klebsiella pneumoniae NOT DETECTED NOT DETECTED Final   Proteus species NOT DETECTED NOT DETECTED Final    Salmonella species NOT DETECTED NOT DETECTED Final   Serratia marcescens NOT DETECTED NOT DETECTED Final   Haemophilus influenzae NOT DETECTED NOT DETECTED Final   Neisseria meningitidis NOT DETECTED NOT DETECTED Final   Pseudomonas aeruginosa NOT DETECTED NOT DETECTED Final   Stenotrophomonas maltophilia NOT DETECTED NOT DETECTED Final   Candida albicans NOT DETECTED NOT DETECTED Final   Candida auris NOT DETECTED NOT DETECTED Final   Candida glabrata NOT DETECTED NOT DETECTED Final   Candida krusei NOT DETECTED NOT DETECTED Final   Candida parapsilosis NOT DETECTED NOT DETECTED Final   Candida tropicalis NOT DETECTED NOT DETECTED Final   Cryptococcus neoformans/gattii NOT DETECTED NOT DETECTED Final   Meth resistant mecA/C and MREJ DETECTED (A) NOT DETECTED Final    Comment: CRITICAL RESULT CALLED TO, READ BACK BY AND VERIFIED WITH: PHARMD JEREMY FRENN AT 6301 ON 10/28/20 BY KJ Performed at [facility], [address] [place], [place] [address]   Respiratory (~20 pathogens) panel by PCR     Status: None   Collection Time: 10/27/20  5:00 PM   Specimen: Nasopharyngeal Swab; Respiratory  Result Value Ref Range Status   Adenovirus NOT DETECTED NOT DETECTED Final   Coronavirus 229E NOT DETECTED NOT DETECTED Final    Comment: (NOTE) The Coronavirus on the Respiratory Panel, DOES NOT test for the novel  Coronavirus (2019 nCoV)    Coronavirus HKU1 NOT DETECTED NOT DETECTED Final   Coronavirus NL63 NOT DETECTED NOT DETECTED Final   Coronavirus OC43 NOT DETECTED NOT DETECTED Final   Metapneumovirus NOT DETECTED NOT DETECTED Final   Rhinovirus / Enterovirus NOT DETECTED NOT DETECTED Final   Influenza A NOT DETECTED NOT DETECTED Final   Influenza B NOT DETECTED NOT DETECTED Final   Parainfluenza Virus 1 NOT DETECTED NOT DETECTED Final   Parainfluenza Virus 2 NOT DETECTED NOT DETECTED Final   Parainfluenza Virus 3 NOT DETECTED NOT DETECTED Final   Parainfluenza Virus 4 NOT DETECTED  NOT DETECTED Final   Respiratory Syncytial Virus NOT DETECTED NOT DETECTED Final   Bordetella pertussis NOT DETECTED NOT DETECTED Final   Bordetella Parapertussis NOT DETECTED NOT DETECTED Final   Chlamydophila pneumoniae NOT DETECTED NOT DETECTED Final   Mycoplasma pneumoniae NOT DETECTED NOT DETECTED Final    Comment: Performed at [facility], [place]. [address], [place], [place] [address]  Culture, blood (Routine X 2) w Reflex to ID Panel     Status: None (Preliminary result)   Collection Time: 10/29/20  2:56 PM   Specimen: BLOOD  Result Value Ref Range Status   Specimen Description BLOOD RIGHT ANTECUBITAL  Final   Special Requests   Final    BOTTLES DRAWN AEROBIC AND ANAEROBIC Blood Culture adequate volume   Culture   Final    NO  GROWTH 4 DAYS Performed at [facility], [place] [address] [place], [place] [address]    Report Status PENDING  Incomplete  Culture, blood (Routine X 2) w Reflex to ID Panel     Status: None (Preliminary result)   Collection Time: 10/29/20  2:57 PM   Specimen: BLOOD  Result Value Ref Range Status   Specimen Description BLOOD LEFT ANTECUBITAL  Final   Special Requests   Final    BOTTLES DRAWN AEROBIC AND ANAEROBIC Blood Culture adequate volume   Culture   Final    NO GROWTH 4 DAYS Performed at [facility], [place] [address] [place], [place] [address]    Report Status PENDING  Incomplete  Culture, blood (Routine X 2) w Reflex to ID Panel     Status: None (Preliminary result)   Collection Time: 10/30/20 10:01 AM   Specimen: BLOOD RIGHT HAND  Result Value Ref Range Status   Specimen Description BLOOD RIGHT HAND  Final   Special Requests   Final    BOTTLES DRAWN AEROBIC ONLY Blood Culture adequate volume   Culture   Final    NO GROWTH 3 DAYS Performed at [facility], [place] [address], [place], [place] [address]    Report Status PENDING  Incomplete  Culture, blood (Routine X 2) w Reflex to ID Panel     Status: None (Preliminary  result)   Collection Time: 10/30/20 10:12 AM   Specimen: BLOOD  Result Value Ref Range Status   Specimen Description BLOOD LEFT ANTECUBITAL  Final   Special Requests   Final    BOTTLES DRAWN AEROBIC ONLY Blood Culture results may not be optimal due to an inadequate volume of blood received in culture bottles   Culture   Final    NO GROWTH 3 DAYS Performed at [facility], [place] [address], [place], [place] [address]    Report Status PENDING  Incomplete     Radiology Studies: ECHO TEE  Result Date: 11/01/2020    TRANSESOPHOGEAL ECHO REPORT   Patient Name:   [name] [name] [name] [name] Date of Exam: 11/01/2020 Medical Rec #:  [id number]   Height:       60.0 in Accession #:    [id number]  Weight:       156.7 lb Date of Birth:  [birth year]   BSA:          1.683 m Patient Age:    25 years    BP:           131/56 mmHg Patient Gender: F           HR:           90 bpm. Exam Location:  Inpatient Procedure: Transesophageal Echo, Cardiac Doppler and Color Doppler Indications:     Bacteremia  History:         Patient has prior history of Echocardiogram examinations, most                  recent 10/25/2020. Signs/Symptoms:Shortness of Breath; Risk                  Factors:Hypertension.  Sonographer:     Clayton Lefort RDCS (AE) Referring Phys:  [id number] Diagnosing Phys: Fransico Him MD PROCEDURE: After discussion of the risks and benefits of a TEE, an informed consent was obtained from the patient. The transesophogeal probe was passed without difficulty through the esophogus of the patient. Sedation performed by different  physician. The patient was monitored while under deep sedation. Anesthestetic sedation was provided intravenously by Anesthesiology: 134.63m of Propofol. Image quality was good. The patient's vital signs; including heart rate, blood pressure, and oxygen saturation; remained stable throughout the procedure. The patient developed no complications during the procedure. IMPRESSIONS  1. Left ventricular  ejection fraction, by estimation, is 60 to 65%. The left ventricle has normal function. The left ventricle has no regional wall motion abnormalities.  2. Right ventricular systolic function is normal. The right ventricular size is normal.  3. No left atrial/left atrial appendage thrombus was detected.  4. The mitral valve is normal in structure. Mild mitral valve regurgitation. No evidence of mitral stenosis.  5. The aortic valve is tricuspid. Aortic valve regurgitation is not visualized. Mild aortic valve sclerosis is present, with no evidence of aortic valve stenosis.  6. There is mild (Grade II) layered plaque involving the transverse aorta, descending aorta and ascending aorta.  7. The inferior vena cava is normal in size with greater than 50% respiratory variability, suggesting right atrial pressure of 3 mmHg. Conclusion(s)/Recommendation(s): Normal biventricular function without evidence of hemodynamically significant valvular heart disease. No vegetations noted. FINDINGS  Left Ventricle: Left ventricular ejection fraction, by estimation, is 60 to 65%. The left ventricle has normal function. The left ventricle has no regional wall motion abnormalities. The left ventricular internal cavity size was normal in size. There is  no left ventricular hypertrophy. Right Ventricle: The right ventricular size is normal. No increase in right ventricular wall thickness. Right ventricular systolic function is normal. Left Atrium: Left atrial size was normal in size. No left atrial/left atrial appendage thrombus was detected. Right Atrium: Right atrial size was normal in size. Pericardium: There is no evidence of pericardial effusion. Mitral Valve: The mitral valve is normal in structure. Mild mitral valve regurgitation. No evidence of mitral valve stenosis. Tricuspid Valve: The tricuspid valve is normal in structure. Tricuspid valve regurgitation is mild . No evidence of tricuspid stenosis. Aortic Valve: The aortic valve is  tricuspid. Aortic valve regurgitation is not visualized. Mild aortic valve sclerosis is present, with no evidence of aortic valve stenosis. Pulmonic Valve: The pulmonic valve was normal in structure. Pulmonic valve regurgitation is trivial. No evidence of pulmonic stenosis. Aorta: The aortic root is normal in size and structure. There is mild (Grade II) layered plaque involving the transverse aorta, descending aorta and ascending aorta. Venous: The inferior vena cava is normal in size with greater than 50% respiratory variability, suggesting right atrial pressure of 3 mmHg. IAS/Shunts: The interatrial septum appears to be lipomatous. No atrial level shunt detected by color flow Doppler. TFransico HimMD Electronically signed by TFransico HimMD Signature Date/Time: 11/01/2020/11:42:04 AM    Final (Updated)    [place]KG SITE RITE  Result Date: 11/01/2020 If Site Rite image not attached, placement could not be confirmed due to current cardiac rhythm.    LOS: 10 days   RAntonieta Pert MD [facility]  11/02/2020, 9:46 AM
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
## 2020-11-02 NOTE — TOC Progression Note (Addendum)
Transition of Care [facility]) - Progression Note    Patient Details  Name: [name] [name] MRN: [id number] Date of Birth: [birth year]  [facility] CM/SW Contact  Joanne Chars, LCSW Phone Number: 11/02/2020, 1:44 PM  Clinical Narrative:  CSW spoke with pt regarding new request for SNF authorization.  Pt continues to state she does want to go to rehab prior to either going home of staying with her grandson.  CSW spoke with Juliann Pulse at [facility], who had made bed offer previously, Juliann Pulse now says [facility] may not have a bed.  CSW spoke with pt again, permission given to send out referral in hub to other facilities, which was completed.  CSW spoke with pt daughter Evonnie Dawes who said the family continues to have options for pt plan after SNF including staying with grandson or staying in her own trailer with [place] nearby for assistance.   1410: SNF authorization request made to [employer].    Expected Discharge Plan: [place] Barriers to Discharge: Continued Medical Work up  Expected Discharge Plan and Services Expected Discharge Plan: [place] Choice: [place] arrangements for the past 2 months: Single Family Home                                       Social Determinants of Health (SDOH) Interventions    Readmission Risk Interventions No flowsheet data found.

## 2020-11-02 NOTE — Progress Notes (Signed)
Peripherally Inserted Central Catheter Placement  The IV Nurse has discussed with the patient and/or persons authorized to consent for the patient, the purpose of this procedure and the potential benefits and risks involved with this procedure.  The benefits include less needle sticks, lab draws from the catheter, and the patient may be discharged home with the catheter. Risks include, but not limited to, infection, bleeding, blood clot (thrombus formation), and puncture of an artery; nerve damage and irregular heartbeat and possibility to perform a PICC exchange if needed/ordered by physician.  Alternatives to this procedure were also discussed.  Bard Power PICC patient education guide, fact sheet on infection prevention and patient information card has been provided to patient /or left at bedside.    PICC Placement Documentation  PICC Single Lumen 53/66/44 Right Basilic 36 cm 0 cm (Active)  Indication for Insertion or Continuance of Line Prolonged intravenous therapies 11/02/20 1018  Exposed Catheter (cm) 0 cm 11/02/20 1018  Site Assessment Clean;Dry;Intact 11/02/20 1018  Line Status Flushed;Blood return noted 11/02/20 1018  Dressing Type Transparent 11/02/20 1018  Dressing Status Clean;Dry;Intact 11/02/20 1018  Antimicrobial disc in place? Yes 11/02/20 1018  Dressing Intervention New dressing;Other (Comment) 11/02/20 1018  Dressing Change Due 11/09/20 11/02/20 1018       [name] [name] 11/02/2020, 10:21 AM

## 2020-11-03 LAB — CBC WITH DIFFERENTIAL/PLATELET
Abs Immature Granulocytes: 0.04 10*3/uL (ref 0.00–0.07)
Basophils Absolute: 0 10*3/uL (ref 0.0–0.1)
Basophils Relative: 0 %
Eosinophils Absolute: 0.4 10*3/uL (ref 0.0–0.5)
Eosinophils Relative: 8 %
HCT: 28.3 % — ABNORMAL LOW (ref 36.0–46.0)
Hemoglobin: 9.2 g/dL — ABNORMAL LOW (ref 12.0–15.0)
Immature Granulocytes: 1 %
Lymphocytes Relative: 30 %
Lymphs Abs: 1.7 10*3/uL (ref 0.7–4.0)
MCH: 32.7 pg (ref 26.0–34.0)
MCHC: 32.5 g/dL (ref 30.0–36.0)
MCV: 100.7 fL — ABNORMAL HIGH (ref 80.0–100.0)
Monocytes Absolute: 0.7 10*3/uL (ref 0.1–1.0)
Monocytes Relative: 12 %
Neutro Abs: 2.9 10*3/uL (ref 1.7–7.7)
Neutrophils Relative %: 49 %
Platelets: 303 10*3/uL (ref 150–400)
RBC: 2.81 MIL/uL — ABNORMAL LOW (ref 3.87–5.11)
RDW: 14.4 % (ref 11.5–15.5)
WBC: 5.8 10*3/uL (ref 4.0–10.5)
nRBC: 0 % (ref 0.0–0.2)

## 2020-11-03 LAB — CULTURE, BLOOD (ROUTINE X 2)
Culture: NO GROWTH
Culture: NO GROWTH
Special Requests: ADEQUATE
Special Requests: ADEQUATE

## 2020-11-03 MED ORDER — ONDANSETRON HCL 4 MG/2ML IJ SOLN: 4.0000 mg | Freq: Four times a day (QID) | INTRAMUSCULAR | Status: DC | PRN

## 2020-11-03 MED ORDER — ALUM & MAG HYDROXIDE-SIMETH 200-200-20 MG/5ML PO SUSP
15.0000 mL | Freq: Once | ORAL | Status: AC
  Administered 2020-11-03: 15 mL via ORAL
  Filled 2020-11-03: qty 30

## 2020-11-03 NOTE — Progress Notes (Signed)
PT Cancellation Note  Patient Details Name: [name] [name] MRN: [id number] DOB: 12-May-1941   Cancelled Treatment:    Reason Eval/Treat Not Completed: Patient not medically ready   Pt reports having diarrhea this morning and just too weak to work with PT at this time. Reports she has been getting up to [facility] with nursing assist.    [name], PT Pager [phone]   [name] 11/03/2020, 11:15 AM

## 2020-11-03 NOTE — Progress Notes (Signed)
PROGRESS NOTE    [name] [name]  GMW:[id number] DOB: 15-Sep-1940 DOA: 10/23/2020 PCP: [name], MD   Chief Complaint  Patient presents with  . Hypoxia  Brief Narrative: 80 year old female with CKD stage IIIb, congenital solitary kidney hypertension hyperlipidemia PVD presented with generalized weakness, shortness of breath.  In the ED chest x-ray showed resolution of previously seen right basilar pneumonia, underwent further work-up with VQ scan but could not be completed and unable to get CT angio due to elevated creatinine, Doppler of the leg negative for DVT. Atenolol 1 further evaluation on 4/18 with CT angiography due to chest pain and PE was ruled out.  Patient still having low-grade fever, blood culture on admission were negative UA unremarkable no pneumonia on CT chest procalcitonin ESR and CRP were elevated concern for ongoing infection underwent CT abdomen pelvis no acute pathology found.  Repeat blood culture on 4/20 grew 1 out of 2 bottles with MRSA, ID was consulted.  [place] Hospitalization: 09/01/2020 to 09/10/2020:Sepsis with acute hypoxic respiratory failure due to pneumonia and altered mental status. She was treated with 5 days of antibiotics with MRSA coverage and was discharged to a SNF from IM teaching service. Patient was managed for MRSA bacteremia TEE negative ID advised daptomycin x4-week PICC line placed 4/26.  Subjective: This morning feeling nauseous/sick, did not sleep well.  is afebrile, hemodynamically stable. BMX1  Assessment & Plan:  MRSA bacteremia:She has had low-grade fever-that prompted further work-up and blood culture that was positive for 1 out of 2 MRSA.Unknown source.ID on board unremarkable TEE plan is to continue daptomycin x 4wk as per ID monitor CK routine labs.repeat blood culture negative on 4/22 -23.  Antibiotic duration: 4 weeks,End Date: 11/26/20   Acute hypoxic respiratory failure/elevated D-dimer:currently on room air no PE on CT angio, no DVT  on Doppler.  Negative for COVID and respiratory virus panel.off Lasix.  Monitor respiratory status  Nausea/feeling sick in the stomach add Maalox, Zofran as needed.  Continue Protonix.  She denies any abdominal pain.  LLE wounds/stasis dermatitis of lower extremities: Continue wound wound care dressing   Epistaxis, transient.  No recurrence  Hyponatremia improved.  Hypokalemia 3.4 resolved.  AKI on CKD stage IIIb, baseline creatinine~1.0, creatinine improved to 0.9.  Patient is off Lasix.  Monitor intermittently while on antibiotics. Recent Labs  Lab 10/29/20 0722 10/30/20 0107 10/31/20 0220 11/01/20 0244 11/02/20 0402  BUN 23 30* 32* 28* 20  CREATININE 1.08* 1.19* 1.18* 1.25* 0.92   Urine retention continue Flomax, off Foley catheter.  Voiding well, as needed bladder scan  Microcytic anemia likely in the setting of CKD/chronic disease remains a stable 9 to 10 g.  Continue iron and B12 supplementation.  Stable in 9 g range Recent Labs  Lab 10/30/20 0107 10/31/20 0220 11/01/20 0244 11/02/20 0402 11/03/20 0113  HGB 9.7* 10.2* 9.4* 9.2* 9.2*  HCT 28.4* 29.4* 28.6* 27.7* 28.3*   Generalized weakness/failure to thrive: In the setting of multiple comorbidities, active infection.  TOC consulted-patient needs skilled nursing facility.  PMR: Has a diagnosis of documented PMR, RA. Apparently patient not aware of diagnosis and daughter also not aware. ESR CRP ANA RF positive.  Patient without headache, tenderness on temporal area -giant cell arteritis not suspected.  Hamulus weakness but with physical deconditioning, active infection-Dr Pahwani discussed with ID regarding steroids and currently holding 2/2 infection.  She will need close follow-up with PCP/rheumatology as outpatient.  Hypomagnesemia:Resolved  Right lower lobe nodule 7 mm radiology advises noncontrast CT chest  in 6 to 12 months 2 cm left thyroid nodule outpatient ultrasound of the thyroid will be needed-follow-up with  PCP  Essential hypertension: BP was soft amlodipine dose has been cut down to 2.5 mg.  Currently stable.    PVD history of iliac stent angioplasty: Continue her aspirin, Plavix.  GERD continue PPI  Anxiety/depression: Mood stable, continue BuSpar, Depakote and Remeron.  Lumbar radiculopathy continue pain meds  Morbid obesity BMI 30.6 will benefit with weight loss outpatient PCP follow-up  Diet Order            Diet Heart Room service appropriate? No; Fluid consistency: Thin  Diet effective now               Patient's Body mass index is 30.61 kg/m.  DVT prophylaxis: enoxaparin (LOVENOX) injection 40 mg Start: 11/02/20 0800 SCDs Start: 10/23/20 1917 Code Status:   Code Status: DNR  Family Communication: plan of care discussed with patient at bedside. I had called her daughter [name]- POA-no answer. Patient reports she has been talking with her family Not able to reach [facility] again- but called and updated another daughter [name] 4/26 -there are about plan of care.  Status is: Inpatient Remains inpatient appropriate because:Ongoing active pain requiring inpatient pain management and Inpatient level of care appropriate due to severity of illness  Dispo: The patient is from: Home, with home health nurse              Anticipated d/c is to: SNF hopefully tomorrow if patient remains asymptomatic and if SNF bed available              Patient currently is not medically stable to d/c.   Difficult to place patient No    Unresulted Labs (From admission, onward)          Start     Ordered   10/29/20 0500  CK  Weekly,   R     Question:  Specimen collection method  Answer:  Lab=Lab collect   10/28/20 1515   10/26/20 0500  CBC with Differential/Platelet  Daily,   R     Question:  Specimen collection method  Answer:  Lab=Lab collect   10/25/20 1225          Medications reviewed:  Scheduled Meds: . amLODipine  2.5 mg Oral Daily  . aspirin  81 mg Oral Daily  . busPIRone  15  mg Oral TID  . Chlorhexidine Gluconate Cloth  6 each Topical Daily  . cholecalciferol  1,000 Units Oral Daily  . clopidogrel  75 mg Oral q morning  . divalproex  125 mg Oral BID  . dorzolamide  1 drop Both Eyes BID   And  . timolol  1 drop Both Eyes BID  . enoxaparin (LOVENOX) injection  40 mg Subcutaneous Q24H  . ferrous sulfate  325 mg Oral Daily  . gabapentin  100 mg Oral TID  . latanoprost  1 drop Both Eyes QHS  . mirtazapine  15 mg Oral QHS  . pantoprazole  40 mg Oral Daily  . polyethylene glycol  17 g Oral Daily  . sodium chloride flush  10-40 mL Intracatheter Q12H  . tamsulosin  0.4 mg Oral Daily  . vitamin B-12  100 mcg Oral Daily   Continuous Infusions: . DAPTOmycin (CUBICIN)  IV 500 mg (11/02/20 2041)  . lactated ringers 125 mL/hr at 11/03/20 7253    Consultants: ID Cardiology   Procedures:see note  Antimicrobials: Anti-infectives (From admission, onward)  Start     Dose/Rate Route Frequency Ordered Stop   10/28/20 1030  DAPTOmycin (CUBICIN) 500 mg in sodium chloride 0.9 % IVPB        500 mg 120 mL/hr over 30 Minutes Intravenous Daily 10/28/20 0930 11/26/20 2359     Culture/Microbiology    Component Value Date/Time   SDES BLOOD LEFT ANTECUBITAL 10/30/2020 1012   SPECREQUEST  10/30/2020 1012    BOTTLES DRAWN AEROBIC ONLY Blood Culture results may not be optimal due to an inadequate volume of blood received in culture bottles   CULT  10/30/2020 1012    NO GROWTH 4 DAYS Performed at [facility], [place] [address] [place], [place] [address]    REPTSTATUS PENDING 10/30/2020 1012    Other culture-see note  Objective: Vitals: Today's Vitals   11/02/20 1426 11/02/20 2152 11/03/20 0016 11/03/20 0343  BP: (!) 123/99 (!) 141/76  (!) 157/68  Pulse: 93 86  100  Resp: _0 Temp: 98.4 F (36.9 C) 98 F (36.7 C)  97.8 F (36.6 C)  TempSrc: Oral     SpO2: 95% 95%  95%  Weight:      Height:      PainSc:   0-No pain     Intake/Output Summary  (Last 24 hours) at 11/03/2020 0906 Last data filed at 11/02/2020 2311 Gross per 24 hour  Intake --  Output 2900 ml  Net -2900 ml   Filed Weights   10/23/20 2040 11/01/20 0711  Weight: 71.1 kg 71.1 kg   Weight change:   Intake/Output from previous day: 04/26 0701 - 04/27 0700 In: -  Out: 2900 [Urine:2900] Intake/Output this shift: No intake/output data recorded. Filed Weights   10/23/20 2040 11/01/20 0711  Weight: 71.1 kg 71.1 kg    Examination: General exam: AAOx3,NAD, weak appearing. HEENT:Oral mucosa moist, Ear/Nose WNL grossly, dentition normal. Respiratory system: bilaterally diminishedd,no wheezing or crackles,no use of accessory muscle Cardiovascular system: S1 & S2 +, No JVD,. Gastrointestinal system: Abdomen soft, NT,ND, BS+ Nervous System:Alert, awake, moving extremities and grossly nonfocal Extremities: No edema, distal peripheral pulses palpable.  Skin: Lower extremity with chronic wound and dressing in place MSK: Normal muscle bulk,tone, power   Data Reviewed: I have personally reviewed following labs and imaging studies CBC: Recent Labs  Lab 10/30/20 0107 10/31/20 0220 11/01/20 0244 11/02/20 0402 11/03/20 0113  WBC 7.2 7.0 6.0 5.3 5.8  NEUTROABS 3.8 3.8 3.0 2.5 2.9  HGB 9.7* 10.2* 9.4* 9.2* 9.2*  HCT 28.4* 29.4* 28.6* 27.7* 28.3*  MCV 98.3 96.4 99.3 99.3 100.7*  PLT 229 250 257 254 016   Basic Metabolic Panel: Recent Labs  Lab 10/28/20 0139 10/29/20 0722 10/30/20 0107 10/31/20 0220 11/01/20 0244 11/02/20 0402  NA 134* 136 130* 133* 135 138  K 4.2 3.6 4.0 3.4* 3.9 3.8  CL 94* 96* 92* 92* 97* 102  CO2 _1 GLUCOSE 94 93 99 106* 103* 95  BUN 22 23 30* 32* 28* 20  CREATININE 1.23* 1.08* 1.19* 1.18* 1.25* 0.92  CALCIUM 8.8* 9.1 8.7* 8.9 8.8* 9.0  MG 1.8  --   --   --   --   --    GFR: Estimated Creatinine Clearance: 42.2 mL/min (by C-G formula based on SCr of 0.92 mg/dL). Liver Function Tests: Recent Labs  Lab  10/28/20 0139  AST 32  ALT 11  ALKPHOS 51  BILITOT 0.5  PROT 6.0*  ALBUMIN 2.5*  No results for input(s): LIPASE, AMYLASE in the last 168 hours. No results for input(s): AMMONIA in the last 168 hours. Coagulation Profile: No results for input(s): INR, PROTIME in the last 168 hours. Cardiac Enzymes: Recent Labs  Lab 10/29/20 0722  CKTOTAL 130   BNP (last 3 results) No results for input(s): PROBNP in the last 8760 hours. HbA1C: No results for input(s): HGBA1C in the last 72 hours. CBG: No results for input(s): GLUCAP in the last 168 hours. Lipid Profile: No results for input(s): CHOL, HDL, LDLCALC, TRIG, CHOLHDL, LDLDIRECT in the last 72 hours. Thyroid Function Tests: No results for input(s): TSH, T4TOTAL, FREET4, T3FREE, THYROIDAB in the last 72 hours. Anemia Panel: No results for input(s): VITAMINB12, FOLATE, FERRITIN, TIBC, IRON, RETICCTPCT in the last 72 hours. Sepsis Labs: No results for input(s): PROCALCITON, LATICACIDVEN in the last 168 hours.  Recent Results (from the past 240 hour(s))  Resp Panel by RT-PCR (Flu A&B, Covid) Nasopharyngeal Swab     Status: None   Collection Time: 10/27/20 10:54 AM   Specimen: Nasopharyngeal Swab; Nasopharyngeal(NP) swabs in vial transport medium  Result Value Ref Range Status   SARS Coronavirus 2 by RT PCR NEGATIVE NEGATIVE Final    Comment: (NOTE) SARS-CoV-2 target nucleic acids are NOT DETECTED.  The SARS-CoV-2 RNA is generally detectable in upper respiratory specimens during the acute phase of infection. The lowest concentration of SARS-CoV-2 viral copies this assay can detect is 138 copies/mL. A negative result does not preclude SARS-Cov-2 infection and should not be used as the sole basis for treatment or other patient management decisions. A negative result may occur with  improper specimen collection/handling, submission of specimen other than nasopharyngeal swab, presence of viral mutation(s) within the areas targeted  by this assay, and inadequate number of viral copies(<138 copies/mL). A negative result must be combined with clinical observations, patient history, and epidemiological information. The expected result is Negative.  Fact Sheet for Patients:  EntrepreneurPulse.com.au  Fact Sheet for Healthcare Providers:  IncredibleEmployment.be  This test is no t yet approved or cleared by the [place] FDA and  has been authorized for detection and/or diagnosis of SARS-CoV-2 by FDA under an Emergency Use Authorization (EUA). This EUA will remain  in effect (meaning this test can be used) for the duration of the COVID-19 declaration under Section 564(b)(1) of the Act, 21 U.S.C.section 360bbb-3(b)(1), unless the authorization is terminated  or revoked sooner.       Influenza A by PCR NEGATIVE NEGATIVE Final   Influenza B by PCR NEGATIVE NEGATIVE Final    Comment: (NOTE) The Xpert Xpress SARS-CoV-2/FLU/RSV plus assay is intended as an aid in the diagnosis of influenza from Nasopharyngeal swab specimens and should not be used as a sole basis for treatment. Nasal washings and aspirates are unacceptable for Xpert Xpress SARS-CoV-2/FLU/RSV testing.  Fact Sheet for Patients: EntrepreneurPulse.com.au  Fact Sheet for Healthcare Providers: IncredibleEmployment.be  This test is not yet approved or cleared by the [place] FDA and has been authorized for detection and/or diagnosis of SARS-CoV-2 by FDA under an Emergency Use Authorization (EUA). This EUA will remain in effect (meaning this test can be used) for the duration of the COVID-19 declaration under Section 564(b)(1) of the Act, 21 U.S.C. section 360bbb-3(b)(1), unless the authorization is terminated or revoked.  Performed at [facility], [place] [address], [place], [name] [address]   Culture, blood (routine x 2)     Status: None   Collection Time: 10/27/20   2:00  PM   Specimen: BLOOD  Result Value Ref Range Status   Specimen Description BLOOD LEFT ANTECUBITAL  Final   Special Requests   Final    BOTTLES DRAWN AEROBIC AND ANAEROBIC Blood Culture adequate volume   Culture   Final    NO GROWTH 5 DAYS Performed at [facility], [address] [place], [place] [address]    Report Status 11/01/2020 FINAL  Final  Culture, blood (routine x 2)     Status: Abnormal   Collection Time: 10/27/20  2:05 PM   Specimen: BLOOD  Result Value Ref Range Status   Specimen Description BLOOD RIGHT ANTECUBITAL  Final   Special Requests   Final    BOTTLES DRAWN AEROBIC AND ANAEROBIC Blood Culture adequate volume   Culture  Setup Time   Final    GRAM POSITIVE COCCI IN CLUSTERS AEROBIC BOTTLE ONLY CRITICAL RESULT CALLED TO, READ BACK BY AND VERIFIED WITH: PHARMD JEREMY FRENN AT 1914 ON 10/28/20 BY KJ Performed at [facility], [place] [address], [place], [place] [address]    Culture METHICILLIN RESISTANT STAPHYLOCOCCUS AUREUS (A)  Final   Report Status 10/30/2020 FINAL  Final   Organism ID, Bacteria METHICILLIN RESISTANT STAPHYLOCOCCUS AUREUS  Final      Susceptibility   Methicillin resistant staphylococcus aureus - MIC*    CIPROFLOXACIN >=8 RESISTANT Resistant     ERYTHROMYCIN >=8 RESISTANT Resistant     GENTAMICIN <=0.5 SENSITIVE Sensitive     OXACILLIN >=4 RESISTANT Resistant     TETRACYCLINE <=1 SENSITIVE Sensitive     VANCOMYCIN <=0.5 SENSITIVE Sensitive     TRIMETH/SULFA <=10 SENSITIVE Sensitive     CLINDAMYCIN <=0.25 SENSITIVE Sensitive     RIFAMPIN <=0.5 SENSITIVE Sensitive     Inducible Clindamycin NEGATIVE Sensitive     * METHICILLIN RESISTANT STAPHYLOCOCCUS AUREUS  Blood Culture ID Panel (Reflexed)     Status: Abnormal   Collection Time: 10/27/20  2:05 PM  Result Value Ref Range Status   Enterococcus faecalis NOT DETECTED NOT DETECTED Final   Enterococcus Faecium NOT DETECTED NOT DETECTED Final   Listeria monocytogenes NOT DETECTED  NOT DETECTED Final   Staphylococcus species DETECTED (A) NOT DETECTED Final    Comment: CRITICAL RESULT CALLED TO, READ BACK BY AND VERIFIED WITH: PHARMD JEREMY FRENN AT 0919 BY KJ ON 10/28/20    Staphylococcus aureus (BCID) DETECTED (A) NOT DETECTED Final    Comment: Methicillin (oxacillin)-resistant Staphylococcus aureus (MRSA). MRSA is predictably resistant to beta-lactam antibiotics (except ceftaroline). Preferred therapy is vancomycin unless clinically contraindicated. Patient requires contact precautions if  hospitalized. CRITICAL RESULT CALLED TO, READ BACK BY AND VERIFIED WITH: [place] AT 0919 ON 10/28/20 BY KJ    Staphylococcus epidermidis NOT DETECTED NOT DETECTED Final   Staphylococcus lugdunensis NOT DETECTED NOT DETECTED Final   Streptococcus species NOT DETECTED NOT DETECTED Final   Streptococcus agalactiae NOT DETECTED NOT DETECTED Final   Streptococcus pneumoniae NOT DETECTED NOT DETECTED Final   Streptococcus pyogenes NOT DETECTED NOT DETECTED Final   A.calcoaceticus-baumannii NOT DETECTED NOT DETECTED Final   Bacteroides fragilis NOT DETECTED NOT DETECTED Final   Enterobacterales NOT DETECTED NOT DETECTED Final   Enterobacter cloacae complex NOT DETECTED NOT DETECTED Final   Escherichia coli NOT DETECTED NOT DETECTED Final   Klebsiella aerogenes NOT DETECTED NOT DETECTED Final   Klebsiella oxytoca NOT DETECTED NOT DETECTED Final   Klebsiella pneumoniae NOT DETECTED NOT DETECTED Final   Proteus species NOT DETECTED NOT DETECTED Final  Salmonella species NOT DETECTED NOT DETECTED Final   Serratia marcescens NOT DETECTED NOT DETECTED Final   Haemophilus influenzae NOT DETECTED NOT DETECTED Final   Neisseria meningitidis NOT DETECTED NOT DETECTED Final   Pseudomonas aeruginosa NOT DETECTED NOT DETECTED Final   Stenotrophomonas maltophilia NOT DETECTED NOT DETECTED Final   Candida albicans NOT DETECTED NOT DETECTED Final   Candida auris NOT DETECTED NOT  DETECTED Final   Candida glabrata NOT DETECTED NOT DETECTED Final   Candida krusei NOT DETECTED NOT DETECTED Final   Candida parapsilosis NOT DETECTED NOT DETECTED Final   Candida tropicalis NOT DETECTED NOT DETECTED Final   Cryptococcus neoformans/gattii NOT DETECTED NOT DETECTED Final   Meth resistant mecA/C and MREJ DETECTED (A) NOT DETECTED Final    Comment: CRITICAL RESULT CALLED TO, READ BACK BY AND VERIFIED WITH: PHARMD JEREMY FRENN AT 2878 ON 10/28/20 BY KJ Performed at [facility], [address] [place], [place] [address]   Respiratory (~20 pathogens) panel by PCR     Status: None   Collection Time: 10/27/20  5:00 PM   Specimen: Nasopharyngeal Swab; Respiratory  Result Value Ref Range Status   Adenovirus NOT DETECTED NOT DETECTED Final   Coronavirus 229E NOT DETECTED NOT DETECTED Final    Comment: (NOTE) The Coronavirus on the Respiratory Panel, DOES NOT test for the novel  Coronavirus (2019 nCoV)    Coronavirus HKU1 NOT DETECTED NOT DETECTED Final   Coronavirus NL63 NOT DETECTED NOT DETECTED Final   Coronavirus OC43 NOT DETECTED NOT DETECTED Final   Metapneumovirus NOT DETECTED NOT DETECTED Final   Rhinovirus / Enterovirus NOT DETECTED NOT DETECTED Final   Influenza A NOT DETECTED NOT DETECTED Final   Influenza B NOT DETECTED NOT DETECTED Final   Parainfluenza Virus 1 NOT DETECTED NOT DETECTED Final   Parainfluenza Virus 2 NOT DETECTED NOT DETECTED Final   Parainfluenza Virus 3 NOT DETECTED NOT DETECTED Final   Parainfluenza Virus 4 NOT DETECTED NOT DETECTED Final   Respiratory Syncytial Virus NOT DETECTED NOT DETECTED Final   Bordetella pertussis NOT DETECTED NOT DETECTED Final   Bordetella Parapertussis NOT DETECTED NOT DETECTED Final   Chlamydophila pneumoniae NOT DETECTED NOT DETECTED Final   Mycoplasma pneumoniae NOT DETECTED NOT DETECTED Final    Comment: Performed at [facility], [place]. [address], [place], [place] [address]  Culture, blood (Routine  X 2) w Reflex to ID Panel     Status: None   Collection Time: 10/29/20  2:56 PM   Specimen: BLOOD  Result Value Ref Range Status   Specimen Description BLOOD RIGHT ANTECUBITAL  Final   Special Requests   Final    BOTTLES DRAWN AEROBIC AND ANAEROBIC Blood Culture adequate volume   Culture   Final    NO GROWTH 5 DAYS Performed at [facility], [place] [address], [place], [place] [address]    Report Status 11/03/2020 FINAL  Final  Culture, blood (Routine X 2) w Reflex to ID Panel     Status: None   Collection Time: 10/29/20  2:57 PM   Specimen: BLOOD  Result Value Ref Range Status   Specimen Description BLOOD LEFT ANTECUBITAL  Final   Special Requests   Final    BOTTLES DRAWN AEROBIC AND ANAEROBIC Blood Culture adequate volume   Culture   Final    NO GROWTH 5 DAYS Performed at [facility], [place] [address], [place], [place] [address]    Report Status 11/03/2020 FINAL  Final  Culture, blood (Routine X 2) w Reflex to ID Panel     Status: None (Preliminary result)   Collection Time: 10/30/20 10:01 AM   Specimen: BLOOD RIGHT HAND  Result Value Ref Range Status   Specimen Description BLOOD RIGHT HAND  Final   Special Requests   Final    BOTTLES DRAWN AEROBIC ONLY Blood Culture adequate volume   Culture   Final    NO GROWTH 4 DAYS Performed at [facility], [address], [place], [place] [address]    Report Status PENDING  Incomplete  Culture, blood (Routine X 2) w Reflex to ID Panel     Status: None (Preliminary result)   Collection Time: 10/30/20 10:12 AM   Specimen: BLOOD  Result Value Ref Range Status   Specimen Description BLOOD LEFT ANTECUBITAL  Final   Special Requests   Final    BOTTLES DRAWN AEROBIC ONLY Blood Culture results may not be optimal due to an inadequate volume of blood received in culture bottles   Culture   Final    NO GROWTH 4 DAYS Performed at [facility], [place] [address] [place], [place] [address]    Report Status PENDING   Incomplete     Radiology Studies: [place] EKG SITE RITE  Result Date: 11/01/2020 If Site Rite image not attached, placement could not be confirmed due to current cardiac rhythm.    LOS: 11 days   Antonieta Pert, MD [facility]  11/03/2020, 9:06 AM

## 2020-11-03 NOTE — Progress Notes (Signed)
Occupational Therapy Treatment Patient Details Name: [name] [name] MRN: [id number] DOB: [birth year] Today's Date: 11/03/2020    History of present illness 80 y/o female who was sent to ED on 10/23/20 after her home health nurse noticed hypoxia and an irregular HR. Pt admitted due to SOB with further work up pending. Multiple lesions bil LEs; +MRSA. Pt with recent admission on 09/01/20 for sepsis secondary to PNA. PMH includes CKD, congenital solitary kideny, HTN, and HLD.   OT comments  Pt progressing to OOB ADL/mobility even though she feels nauseous. Pt currently, MinA for sit to stand and taking a few steps from bed to recliner with RW; pt stood x1 min for pericare. Pt fatigues very easily and performing grooming in sitting with set-upA. Pt would benefit from continued OT skilled services. Pt agreeable to SNF before d/c home. OT following acutely.    Follow Up Recommendations  SNF;Supervision/Assistance - 24 hour    Equipment Recommendations  3 in 1 bedside commode    Recommendations for Other Services      Precautions / Restrictions Precautions Precautions: Fall Restrictions Weight Bearing Restrictions: No       Mobility Bed Mobility Overal bed mobility: Needs Assistance Bed Mobility: Rolling;Sidelying to Sit Rolling: Supervision Sidelying to sit: Min assist;HOB elevated       General bed mobility comments: using therapist's hand to reach for during trunk elevation    Transfers Overall transfer level: Needs assistance Equipment used: Rolling walker (2 wheeled) Transfers: Sit to/from [facility] Sit to Stand: Min assist Stand pivot transfers: Min assist       General transfer comment: MinA for sit to stand and taking a few steps from bed to recliner with RW; pt stood x1 min for pericare.    Balance Overall balance assessment: Needs assistance Sitting-balance support: Feet supported;Single extremity supported Sitting balance-Leahy Scale: Fair      Standing balance support: Bilateral upper extremity supported;During functional activity Standing balance-Leahy Scale: Poor Standing balance comment: requires UE support                           ADL either performed or assessed with clinical judgement   ADL Overall ADL's : Needs assistance/impaired Eating/Feeding: Set up;Sitting   Grooming: Sitting;Wash/dry face;Wash/dry hands;Brushing hair;Minimal assistance Grooming Details (indicate cue type and reason): MinA overall for brush back of hair and place ina  pony tail Upper Body Bathing: Minimal assistance;Sitting   Lower Body Bathing: Moderate assistance;Sitting/lateral leans;Sit to/from stand Lower Body Bathing Details (indicate cue type and reason): standing for pericare/LB bathing         Toilet Transfer: Min Geophysical data processor Details (indicate cue type and reason): cues for hand placement                 Vision       Perception     Praxis      Cognition Arousal/Alertness: Awake/alert Behavior During Therapy: WFL for tasks assessed/performed Overall Cognitive Status: No family/caregiver present to determine baseline cognitive functioning Area of Impairment: Memory                               General Comments: speaking about family dynamics and then stating "my grandson is the only one that gives a crap about me, but if I say anything the others won't like to hear that." Pt following all commands  Exercises     Shoulder Instructions       General Comments VSS on RA.    Pertinent Vitals/ Pain       Pain Assessment: Faces Faces Pain Scale: Hurts little more Pain Location: LE wounds Pain Descriptors / Indicators: Grimacing;Tender Pain Intervention(s): Monitored during session  Home Living                                          Prior Functioning/Environment              Frequency  Min 2X/week        Progress  Toward Goals  OT Goals(current goals can now be found in the care plan section)  Progress towards OT goals: Progressing toward goals  Acute Rehab OT Goals Patient Stated Goal: hurt less and go home OT Goal Formulation: With patient Time For Goal Achievement: 11/08/20 Potential to Achieve Goals: Good ADL Goals Pt Will Perform Grooming: with supervision;standing Pt Will Perform Lower Body Dressing: with supervision;sit to/from stand;sitting/lateral leans Pt Will Transfer to Toilet: with supervision;ambulating;regular height toilet Pt Will Perform Toileting - Clothing Manipulation and hygiene: with supervision;sitting/lateral leans;sit to/from stand Pt/caregiver will Perform Home Exercise Program: Increased strength;Both right and left upper extremity;With Supervision;With written HEP provided Additional ADL Goal #1: Pt to demonstrate ability to attend to a functional tasks ~5 mins with <2 cues. Additional ADL Goal #2: Pt will increase to x10 mins of OOB ADL with O2 sats >90%.  Plan Discharge plan remains appropriate    Co-evaluation                 AM-PAC OT "6 Clicks" Daily Activity     Outcome Measure   Help from another person eating meals?: None Help from another person taking care of personal grooming?: A Little Help from another person toileting, which includes using toliet, bedpan, or urinal?: A Lot Help from another person bathing (including washing, rinsing, drying)?: A Little Help from another person to put on and taking off regular upper body clothing?: A Little Help from another person to put on and taking off regular lower body clothing?: A Lot 6 Click Score: 17    End of Session Equipment Utilized During Treatment: Gait belt;Rolling walker  OT Visit Diagnosis: Unsteadiness on feet (R26.81);Other abnormalities of gait and mobility (R26.89);Muscle weakness (generalized) (M62.81);Other symptoms and signs involving cognitive function;Pain Pain - Right/Left:   (B/L) Pain - part of body: Leg   Activity Tolerance Patient tolerated treatment well   Patient Left in chair;with call bell/phone within reach;with chair alarm set   Nurse Communication Mobility status        Time: 1140-1210 OT Time Calculation (min): 30 min  Charges: OT General Charges $OT Visit: 1 Visit OT Treatments $Self Care/Home Management : 8-22 mins $Therapeutic Activity: 8-22 mins  Jefferey Pica, OTR/L Acute Rehabilitation Services Pager: [phone] Office: [phone]    Makayla Confer C 11/03/2020, 2:32 PM

## 2020-11-04 LAB — CBC WITH DIFFERENTIAL/PLATELET
Abs Immature Granulocytes: 0.04 10*3/uL (ref 0.00–0.07)
Basophils Absolute: 0 10*3/uL (ref 0.0–0.1)
Basophils Relative: 0 %
Eosinophils Absolute: 0.2 10*3/uL (ref 0.0–0.5)
Eosinophils Relative: 4 %
HCT: 28.2 % — ABNORMAL LOW (ref 36.0–46.0)
Hemoglobin: 9.3 g/dL — ABNORMAL LOW (ref 12.0–15.0)
Immature Granulocytes: 1 %
Lymphocytes Relative: 28 %
Lymphs Abs: 1.7 10*3/uL (ref 0.7–4.0)
MCH: 32.4 pg (ref 26.0–34.0)
MCHC: 33 g/dL (ref 30.0–36.0)
MCV: 98.3 fL (ref 80.0–100.0)
Monocytes Absolute: 0.6 10*3/uL (ref 0.1–1.0)
Monocytes Relative: 10 %
Neutro Abs: 3.4 10*3/uL (ref 1.7–7.7)
Neutrophils Relative %: 57 %
Platelets: 325 10*3/uL (ref 150–400)
RBC: 2.87 MIL/uL — ABNORMAL LOW (ref 3.87–5.11)
RDW: 14.4 % (ref 11.5–15.5)
WBC: 5.9 10*3/uL (ref 4.0–10.5)
nRBC: 0 % (ref 0.0–0.2)

## 2020-11-04 LAB — CULTURE, BLOOD (ROUTINE X 2)
Culture: NO GROWTH
Culture: NO GROWTH
Special Requests: ADEQUATE

## 2020-11-04 LAB — SARS CORONAVIRUS 2 (TAT 6-24 HRS): SARS Coronavirus 2: NEGATIVE

## 2020-11-04 MED ORDER — AMLODIPINE BESYLATE 2.5 MG PO TABS: 2.5000 mg | ORAL_TABLET | Freq: Every day | ORAL | Status: DC

## 2020-11-04 MED ORDER — ASPIRIN 81 MG PO TBEC: 81.0000 mg | DELAYED_RELEASE_TABLET | Freq: Every day | ORAL | 12 refills | Status: DC

## 2020-11-04 MED ORDER — TRAMADOL HCL 50 MG PO TABS: 50.0000 mg | ORAL_TABLET | Freq: Three times a day (TID) | ORAL | 0 refills | Status: DC | PRN

## 2020-11-04 MED ORDER — DAPTOMYCIN IV (FOR PTA / DISCHARGE USE ONLY): 500.0000 mg | INTRAVENOUS | 0 refills | Status: AC

## 2020-11-04 MED ORDER — TAMSULOSIN HCL 0.4 MG PO CAPS: 0.4000 mg | ORAL_CAPSULE | Freq: Every day | ORAL | Status: DC

## 2020-11-04 NOTE — Plan of Care (Signed)
Activity will continue to increase as tolerated.  Pt will assist in adls.

## 2020-11-04 NOTE — TOC Progression Note (Addendum)
Transition of Care [facility]) - Progression Note    Patient Details  Name: SHARDA KEDDY MRN: [id number] Date of Birth: 03-18-1941  Transition of Care [facility] CM/SW Contact  Joanne Chars, LCSW Phone Number: 11/04/2020, 10:32 AM  Clinical Narrative: SNF authorization received from [facility]: [place], 7 days.  [facility] needs a separate auth request for IV abx and this form was completed and faxed at 1030.  Stephanie at [facility] informed.  Juliann Pulse at [facility] informed-they do have available bed tomorrow, are aware of IV abx.  Pt and daughter updated.   1705: Message from [place], [place].  IV abx drug carveout request has been approved.    Expected Discharge Plan: [place] Barriers to Discharge: Continued Medical Work up  Expected Discharge Plan and Services Expected Discharge Plan: [place] Choice: [place] arrangements for the past 2 months: Single Family Home                                       Social Determinants of Health (SDOH) Interventions    Readmission Risk Interventions No flowsheet data found.

## 2020-11-04 NOTE — Discharge Summary (Signed)
Physician Discharge Summary  [name] [name] EVO:[id number] DOB: 10-09-1940 DOA: 10/23/2020  PCP: [name], MD  Admit date: 10/23/2020 Discharge date: 11/04/2020  Admitted From: snf Disposition:  snf  Recommendations for Outpatient Follow-up:  1. Follow up with PCP in 1-2 weeks 2. Follow-up with Dr. [name] from rheumatology next week 3. Please obtain BMP/CBC/CMP/CK weekly while on antibiotics  Home Health:NO  Equipment/Devices: NONE  Discharge Condition: Stable Code Status:   Code Status: DNR Diet recommendation:  Diet Order            Diet - low sodium heart healthy           Diet Heart Room service appropriate? No; Fluid consistency: Thin  Diet effective now                  Brief/Interim Summary: 80 year old female with CKD stage IIIb, congenital solitary kidney hypertension hyperlipidemia PVD, PMR hx who was on prednisone previously but stopped taking it presented with generalized weakness, shortness of breath.  In the ED chest x-ray showed resolution of previously seen right basilar pneumonia, underwent further work-up with VQ scan but could not be completed and unable to get CT angio due to elevated creatinine, Doppler of the leg negative for DVT. Atenolol 1 further evaluation on 4/18 with CT angiography due to chest pain and PE was ruled out.  Patient still having low-grade fever, blood culture on admission were negative UA unremarkable no pneumonia on CT chest procalcitonin ESR and CRP were elevated concern for ongoing infection underwent CT abdomen pelvis no acute pathology found.  Repeat blood culture on 4/20 grew 1 out of 2 bottles with MRSA, ID was consulted.  [facility] Hospitalization: 09/01/2020 to 09/10/2020:Sepsis with acute hypoxic respiratory failure due to pneumonia and altered mental status. She was treated with 5 days of antibiotics with MRSA coverage and was discharged to a SNF from IM teaching service. Patient was managed for MRSA bacteremia TEE negative ID advised  daptomycin x4-week PICC line placed 4/26.  Discharge Diagnoses:  MRSA bacteremia:She has had low-grade fever-that prompted further work-up and blood culture that was positive for 1 out of 2 MRSA.Unknown source.ID on board unremarkable TEE plan is to continue daptomycin x 4wk as per ID monitor CK routine labs.repeat blood culture negative on 4/22 -23.  Antibiotic duration: 4 weeks,End Date: 11/26/20   Acute hypoxic respiratory failure/elevated D-dimer:currently on room air no PE on CT angio, no DVT on Doppler.  Negative for COVID and respiratory virus panel.off Lasix.  Monitor respiratory status Nausea/feeling sick in the stomach resolved.  Continue PPI.   LLE wounds/stasis dermatitis of lower extremities: Continue wound wound care dressing   Epistaxis, transient.  No recurrence Hyponatremia improved. Hypokalemia 3.4 resolved. AKI on CKD stage IIIb, baseline creatinine~1.0, creatinine improved to 0.9.  Patient is off Lasix.  Monitor intermittently while on antibiotics at SNF. Recent Labs  Lab 10/29/20 0722 10/30/20 0107 10/31/20 0220 11/01/20 0244 11/02/20 0402  BUN 23 30* 32* 28* 20  CREATININE 1.08* 1.19* 1.18* 1.25* 0.92   Urine retention continue Flomax, off Foley catheter.  Voiding well, as needed bladder scan  Microcytic anemia likely in the setting of CKD/chronic disease remains a stable 9 to 10 g.  Continue iron and B12 supplementation.  Stable in 9 g range Recent Labs  Lab 10/31/20 0220 11/01/20 0244 11/02/20 0402 11/03/20 0113 11/04/20 0230  HGB 10.2* 9.4* 9.2* 9.2* 9.3*  HCT 29.4* 28.6* 27.7* 28.3* 28.2*   Generalized weakness/failure to thrive: In the setting  of multiple comorbidities, active infection.  TOC consulted-patient needs skilled nursing facility.  PMR: Has a diagnosis of documented PMR, patient was previously on prednisone she was referred to rheumatology it appears patient stopped taking prednisone for a few years.  CK normal on admission 113, CRP 25> 19,  ESR > 140  On 4/20 and 128 on 4/21, ANA +, RF 18.9 on admission.Patient without headache, tenderness on temporal area -giant cell arteritis not suspected.  However she does have generalized weakness easy fatigability, proximal weakness.Dr Doristine Bosworth discussed with ID regarding steroids not started due to infectious work-up.  I discussed with Dr. [name] from rheumatology he advised to hold off on steroid for now and he can see the patient next week-I have made an ambulatory referral for [facility]ology follow-up next week.   Hypomagnesemia:Resolved  Right lower lobe nodule 7 mm radiology advises noncontrast CT chest in 6 to 12 months 2 cm left thyroid nodule outpatient ultrasound of the thyroid will be needed-follow-up with PCP  Essential hypertension: BP was soft amlodipine dose has been cut down to 2.5 mg.  Currently stable.    PVD history of iliac stent angioplasty: Continue her aspirin, Plavix.  GERD continue PPI  Anxiety/depression: Mood stable, continue BuSpar, Depakote and Remeron.  Lumbar radiculopathy continue pain meds  Morbid obesity BMI 30.6 will benefit with weight loss outpatient PCP follow-up  Consults:  ID Subjective: Alert awake oriented, still feeling weak but no other new complaint.  No jaw pain no eye issues no blurring no headache.  Discharge Exam: Vitals:   11/03/20 1327 11/04/20 0909  BP: 135/73 (!) 152/78  Pulse: 98 98  Resp: 16 20  Temp:  98.5 F (36.9 C)  SpO2: 98% 96%   General: Pt is alert, awake, not in acute distress Cardiovascular: RRR, S1/S2 +, no rubs, no gallops Respiratory: CTA bilaterally, no wheezing, no rhonchi Abdominal: Soft, NT, ND, bowel sounds + Extremities: no edema, no cyanosis  Discharge Instructions  Discharge Instructions    [facility] pharmacist to adjust dose for Vancomycin, Aminoglycosides and other anti-infective therapies as requested by physician.   Complete by: As directed    [facility] to provide Cath  Flo 49m   Complete by: As directed    Administer for PICC line occlusion and as ordered by physician for other access device issues.   Ambulatory referral to Rheumatology   Complete by: As directed    Please arrange for a visit next week.  I have spoken to Dr. RBenjamine Mola  [facility]   Anaphylaxis Kit: Provided to treat any anaphylactic reaction to the medication being provided to the patient if First Dose or when requested by physician   Complete by: As directed    Epinephrine 148mml vial / amp: Administer 0.38m15m0.38ml30mubcutaneously once for moderate to severe anaphylaxis, nurse to call physician and pharmacy when reaction occurs and call 911 if needed for immediate care   Diphenhydramine 50mg75mIV vial: Administer 25-50mg 14mM PRN for first dose reaction, rash, itching, mild reaction, nurse to call physician and pharmacy when reaction occurs   Sodium Chloride 0.9% NS 500ml I9mdminister if needed for hypovolemic blood pressure drop or as ordered by physician after call to physician with anaphylactic reaction   Change dressing on IV access line weekly and PRN   Complete by: As directed    Diet - low sodium heart healthy   Complete by: As directed  Discharge instructions   Complete by: As directed    She will need to follow-up with rheumatology office next week with Dr. [name] please call the office to confirm appointment-this is regarding her edema and arteritis/PMR with weakness.   Please call call MD or return to ER for similar or worsening recurring problem that brought you to hospital or if any fever,nausea/vomiting,abdominal pain, uncontrolled pain, chest pain,  shortness of breath or any other alarming symptoms.  Please follow-up your doctor as instructed in a week time and call the office for appointment.  Obtain weekly labs CBC BMP CK while on IV daptomycin status from NEXT WEEK   Please avoid alcohol, smoking, or  any other illicit substance and maintain healthy habits including taking your regular medications as prescribed.  You were cared for by a hospitalist during your hospital stay. If you have any questions about your discharge medications or the care you received while you were in the hospital after you are discharged, you can call the unit and ask to speak with the hospitalist on call if the hospitalist that took care of you is not available.  Once you are discharged, your primary care physician will handle any further medical issues. Please note that NO REFILLS for any discharge medications will be authorized once you are discharged, as it is imperative that you return to your primary care physician (or establish a relationship with a primary care physician if you do not have one) for your aftercare needs so that they can reassess your need for medications and monitor your lab values   Discharge wound care:   Complete by: As directed    Gently wash BLE with soap and water. Pat dry. Apply Sween Moisturizing Ointment to intact skin. Place a Xeroform gauze Kellie Simmering [phone] over each blister. Beginning behind the toes and going to just below the knees, spiral wrap kerlex. Perform daily. MOISTEN old dressing with saline prior to removing   Flush IV access with Sodium Chloride 0.9% and Heparin 10 units/ml or 100 units/ml   Complete by: As directed    Home infusion instructions - [facility]   Complete by: As directed    Instructions: Flush IV access with Sodium Chloride 0.9% and Heparin 10units/ml or 100units/ml   Change dressing on IV access line: Weekly and PRN   Instructions Cath Flo 18m: Administer for PICC Line occlusion and as ordered by physician for other access device   [facility] pharmacist to adjust dose for: Vancomycin, Aminoglycosides and other anti-infective therapies as requested by physician   Increase activity slowly   Complete by: As directed    Method of  administration may be changed at the discretion of home infusion pharmacist based upon assessment of the patient and/or caregiver's ability to self-administer the medication ordered   Complete by: As directed      Allergies as of 11/04/2020      Reactions   Statins    Gabapentin Itching   Was deleted since patient was taking, but now experiencing itching again.    Statins Swelling, Rash, Other (See Comments)   Swelling involving tongue; also causes muscle pain      Medication List    TAKE these medications   acetaminophen 500 MG tablet Commonly known as: TYLENOL Take 500 mg by mouth every 8 (eight) hours as needed for moderate pain.   albuterol 108 (90 Base) MCG/ACT inhaler Commonly known as: VENTOLIN HFA Inhale 2 puffs into the lungs every 6 (six) hours  as needed for cough, wheezing or shortness of breath.   amLODipine 2.5 MG tablet Commonly known as: NORVASC Take 1 tablet (2.5 mg total) by mouth daily. Start taking on: November 05, 2020 What changed:   medication strength  how much to take  how to take this  when to take this  additional instructions   aspirin 81 MG EC tablet Take 1 tablet (81 mg total) by mouth daily.   busPIRone 15 MG tablet Commonly known as: BUSPAR Take 1 tablet (15 mg total) by mouth 3 (three) times daily.   cholecalciferol 1000 units tablet Commonly known as: VITAMIN D Take 1,000 Units by mouth daily.   clopidogrel 75 MG tablet Commonly known as: PLAVIX Take 1 tablet (75 mg total) by mouth every morning.   daptomycin  IVPB Commonly known as: CUBICIN Inject 500 mg into the vein daily for 25 days. Indication:  MRSA Bacteremia First Dose: Yes Last Day of Therapy:  11/26/20 Labs - Once weekly:  CBC/D, BMP, and CPK Labs - Every other week:  ESR and CRP Method of administration: IV Push Method of administration may be changed at the discretion of home infusion pharmacist based upon assessment of the patient and/or caregiver's ability to  self-administer the medication ordered.   divalproex 125 MG DR tablet Commonly known as: DEPAKOTE Take 125 mg by mouth 2 (two) times daily.   Dorzolamide HCl-Timolol Mal PF 2-0.5 % Soln Place 1 drop into both eyes 2 (two) times daily. What changed: Another medication with the same name was removed. Continue taking this medication, and follow the directions you see here.   ferrous sulfate 325 (65 FE) MG tablet Take 325 mg by mouth daily.   furosemide 20 MG tablet Commonly known as: LASIX Take 20 mg by mouth every other day as needed.   gabapentin 100 MG capsule Commonly known as: NEURONTIN Take 100 mg by mouth 3 (three) times daily.   Latanoprostene Bunod 0.024 % Soln Place 1 drop into both eyes at bedtime. What changed: Another medication with the same name was removed. Continue taking this medication, and follow the directions you see here.   mirtazapine 15 MG tablet Commonly known as: REMERON Take 1 tablet (15 mg total) by mouth at bedtime.   nystatin powder Commonly known as: MYCOSTATIN/NYSTOP Apply 1 application topically 3 (three) times daily.   pantoprazole 40 MG tablet Commonly known as: PROTONIX Take 40 mg by mouth daily.   tamsulosin 0.4 MG Caps capsule Commonly known as: FLOMAX Take 1 capsule (0.4 mg total) by mouth daily after supper.   traMADol 50 MG tablet Commonly known as: ULTRAM Take 1 tablet (50 mg total) by mouth 3 (three) times daily as needed for up to 4 doses for moderate pain.   VITAMIN B 12 PO Take 1 tablet by mouth daily.   VITAMIN C PO Take 1 tablet by mouth daily.            Discharge Care Instructions  (From admission, onward)         Start     Ordered   11/04/20 0000  Change dressing on IV access line weekly and PRN  (Home infusion instructions - [facility] )        11/04/20 1026   11/04/20 0000  Discharge wound care:       Comments: Gently wash BLE with soap and water. Pat dry. Apply Sween Moisturizing Ointment  to intact skin. Place a Xeroform gauze Kellie Simmering [phone]) over each blister.  Beginning behind the toes and going to just below the knees, spiral wrap kerlex. Perform daily. MOISTEN old dressing with saline prior to removing   11/04/20 1055          Follow-up Information    [name], MD Follow up in 1 week(s).   Specialty: Family Medicine Contact information: [place] [address] [phone]        [name], MD .   Specialties: Cardiology, Radiology Contact information: [address] [place] [address] [phone]        Collier Salina, MD Follow up in 1 week(s).   Specialty: Rheumatology Contact information: [address] [place] [address] [phone]              Allergies  Allergen Reactions  . Statins   . Gabapentin Itching    Was deleted since patient was taking, but now experiencing itching again.   . Statins Swelling, Rash and Other (See Comments)    Swelling involving tongue; also causes muscle pain    The results of significant diagnostics from this hospitalization (including imaging, microbiology, ancillary and laboratory) are listed below for reference.    Microbiology: Recent Results (from the past 240 hour(s))  Resp Panel by RT-PCR (Flu A&B, Covid) Nasopharyngeal Swab     Status: None   Collection Time: 10/27/20 10:54 AM   Specimen: Nasopharyngeal Swab; Nasopharyngeal(NP) swabs in vial transport medium  Result Value Ref Range Status   SARS Coronavirus 2 by RT PCR NEGATIVE NEGATIVE Final    Comment: (NOTE) SARS-CoV-2 target nucleic acids are NOT DETECTED.  The SARS-CoV-2 RNA is generally detectable in upper respiratory specimens during the acute phase of infection. The lowest concentration of SARS-CoV-2 viral copies this assay can detect is 138 copies/mL. A negative result does not preclude SARS-Cov-2 infection and should not be used as the sole basis for treatment  or other patient management decisions. A negative result may occur with  improper specimen collection/handling, submission of specimen other than nasopharyngeal swab, presence of viral mutation(s) within the areas targeted by this assay, and inadequate number of viral copies(<138 copies/mL). A negative result must be combined with clinical observations, patient history, and epidemiological information. The expected result is Negative.  Fact Sheet for Patients:  EntrepreneurPulse.com.au  Fact Sheet for Healthcare Providers:  IncredibleEmployment.be  This test is no t yet approved or cleared by the [place] FDA and  has been authorized for detection and/or diagnosis of SARS-CoV-2 by FDA under an Emergency Use Authorization (EUA). This EUA will remain  in effect (meaning this test can be used) for the duration of the COVID-19 declaration under Section 564(b)(1) of the Act, 21 U.S.C.section 360bbb-3(b)(1), unless the authorization is terminated  or revoked sooner.       Influenza A by PCR NEGATIVE NEGATIVE Final   Influenza B by PCR NEGATIVE NEGATIVE Final    Comment: (NOTE) The Xpert Xpress SARS-CoV-2/FLU/RSV plus assay is intended as an aid in the diagnosis of influenza from Nasopharyngeal swab specimens and should not be used as a sole basis for treatment. Nasal washings and aspirates are unacceptable for Xpert Xpress SARS-CoV-2/FLU/RSV testing.  Fact Sheet for Patients: EntrepreneurPulse.com.au  Fact Sheet for Healthcare Providers: IncredibleEmployment.be  This test is not yet approved or cleared by the [place] FDA and has been authorized for detection and/or diagnosis of SARS-CoV-2 by FDA under an Emergency Use Authorization (EUA). This EUA will remain in effect (meaning this test can be  used) for the duration of the COVID-19 declaration under Section 564(b)(1) of the Act, 21 U.S.C. section  360bbb-3(b)(1), unless the authorization is terminated or revoked.  Performed at [facility], [place] [address] [place], [place] [address]   Culture, blood (routine x 2)     Status: None   Collection Time: 10/27/20  2:00 PM   Specimen: BLOOD  Result Value Ref Range Status   Specimen Description BLOOD LEFT ANTECUBITAL  Final   Special Requests   Final    BOTTLES DRAWN AEROBIC AND ANAEROBIC Blood Culture adequate volume   Culture   Final    NO GROWTH 5 DAYS Performed at [facility], [place] [address] [place], [place] [address]    Report Status 11/01/2020 FINAL  Final  Culture, blood (routine x 2)     Status: Abnormal   Collection Time: 10/27/20  2:05 PM   Specimen: BLOOD  Result Value Ref Range Status   Specimen Description BLOOD RIGHT ANTECUBITAL  Final   Special Requests   Final    BOTTLES DRAWN AEROBIC AND ANAEROBIC Blood Culture adequate volume   Culture  Setup Time   Final    GRAM POSITIVE COCCI IN CLUSTERS AEROBIC BOTTLE ONLY CRITICAL RESULT CALLED TO, READ BACK BY AND VERIFIED WITH: PHARMD JEREMY FRENN AT 2878 ON 10/28/20 BY KJ Performed at [facility], [place] [address] [place], [place] [address]    Culture METHICILLIN RESISTANT STAPHYLOCOCCUS AUREUS (A)  Final   Report Status 10/30/2020 FINAL  Final   Organism ID, Bacteria METHICILLIN RESISTANT STAPHYLOCOCCUS AUREUS  Final      Susceptibility   Methicillin resistant staphylococcus aureus - MIC*    CIPROFLOXACIN >=8 RESISTANT Resistant     ERYTHROMYCIN >=8 RESISTANT Resistant     GENTAMICIN <=0.5 SENSITIVE Sensitive     OXACILLIN >=4 RESISTANT Resistant     TETRACYCLINE <=1 SENSITIVE Sensitive     VANCOMYCIN <=0.5 SENSITIVE Sensitive     TRIMETH/SULFA <=10 SENSITIVE Sensitive     CLINDAMYCIN <=0.25 SENSITIVE Sensitive     RIFAMPIN <=0.5 SENSITIVE Sensitive     Inducible Clindamycin NEGATIVE Sensitive     * METHICILLIN RESISTANT STAPHYLOCOCCUS AUREUS  Blood Culture ID Panel (Reflexed)     Status:  Abnormal   Collection Time: 10/27/20  2:05 PM  Result Value Ref Range Status   Enterococcus faecalis NOT DETECTED NOT DETECTED Final   Enterococcus Faecium NOT DETECTED NOT DETECTED Final   Listeria monocytogenes NOT DETECTED NOT DETECTED Final   Staphylococcus species DETECTED (A) NOT DETECTED Final    Comment: CRITICAL RESULT CALLED TO, READ BACK BY AND VERIFIED WITH: PHARMD JEREMY FRENN AT 0919 BY KJ ON 10/28/20    Staphylococcus aureus (BCID) DETECTED (A) NOT DETECTED Final    Comment: Methicillin (oxacillin)-resistant Staphylococcus aureus (MRSA). MRSA is predictably resistant to beta-lactam antibiotics (except ceftaroline). Preferred therapy is vancomycin unless clinically contraindicated. Patient requires contact precautions if  hospitalized. CRITICAL RESULT CALLED TO, READ BACK BY AND VERIFIED WITH: PHARMD JEREMY FRENN AT 0919 ON 10/28/20 BY KJ    Staphylococcus epidermidis NOT DETECTED NOT DETECTED Final   Staphylococcus lugdunensis NOT DETECTED NOT DETECTED Final   Streptococcus species NOT DETECTED NOT DETECTED Final   Streptococcus agalactiae NOT DETECTED NOT DETECTED Final   Streptococcus pneumoniae NOT DETECTED NOT DETECTED Final   Streptococcus pyogenes NOT DETECTED NOT DETECTED Final   A.calcoaceticus-baumannii NOT DETECTED NOT DETECTED Final   Bacteroides fragilis NOT DETECTED NOT DETECTED Final   Enterobacterales NOT  DETECTED NOT DETECTED Final   Enterobacter cloacae complex NOT DETECTED NOT DETECTED Final   Escherichia coli NOT DETECTED NOT DETECTED Final   Klebsiella aerogenes NOT DETECTED NOT DETECTED Final   Klebsiella oxytoca NOT DETECTED NOT DETECTED Final   Klebsiella pneumoniae NOT DETECTED NOT DETECTED Final   Proteus species NOT DETECTED NOT DETECTED Final   Salmonella species NOT DETECTED NOT DETECTED Final   Serratia marcescens NOT DETECTED NOT DETECTED Final   Haemophilus influenzae NOT DETECTED NOT DETECTED Final   Neisseria meningitidis NOT DETECTED  NOT DETECTED Final   Pseudomonas aeruginosa NOT DETECTED NOT DETECTED Final   Stenotrophomonas maltophilia NOT DETECTED NOT DETECTED Final   Candida albicans NOT DETECTED NOT DETECTED Final   Candida auris NOT DETECTED NOT DETECTED Final   Candida glabrata NOT DETECTED NOT DETECTED Final   Candida krusei NOT DETECTED NOT DETECTED Final   Candida parapsilosis NOT DETECTED NOT DETECTED Final   Candida tropicalis NOT DETECTED NOT DETECTED Final   Cryptococcus neoformans/gattii NOT DETECTED NOT DETECTED Final   Meth resistant mecA/C and MREJ DETECTED (A) NOT DETECTED Final    Comment: CRITICAL RESULT CALLED TO, READ BACK BY AND VERIFIED WITH: PHARMD JEREMY FRENN AT 6333 ON 10/28/20 BY KJ Performed at [facility], [address], [place], [place] [address]   Respiratory (~20 pathogens) panel by PCR     Status: None   Collection Time: 10/27/20  5:00 PM   Specimen: Nasopharyngeal Swab; Respiratory  Result Value Ref Range Status   Adenovirus NOT DETECTED NOT DETECTED Final   Coronavirus 229E NOT DETECTED NOT DETECTED Final    Comment: (NOTE) The Coronavirus on the Respiratory Panel, DOES NOT test for the novel  Coronavirus (2019 nCoV)    Coronavirus HKU1 NOT DETECTED NOT DETECTED Final   Coronavirus NL63 NOT DETECTED NOT DETECTED Final   Coronavirus OC43 NOT DETECTED NOT DETECTED Final   Metapneumovirus NOT DETECTED NOT DETECTED Final   Rhinovirus / Enterovirus NOT DETECTED NOT DETECTED Final   Influenza A NOT DETECTED NOT DETECTED Final   Influenza B NOT DETECTED NOT DETECTED Final   Parainfluenza Virus 1 NOT DETECTED NOT DETECTED Final   Parainfluenza Virus 2 NOT DETECTED NOT DETECTED Final   Parainfluenza Virus 3 NOT DETECTED NOT DETECTED Final   Parainfluenza Virus 4 NOT DETECTED NOT DETECTED Final   Respiratory Syncytial Virus NOT DETECTED NOT DETECTED Final   Bordetella pertussis NOT DETECTED NOT DETECTED Final   Bordetella Parapertussis NOT DETECTED NOT DETECTED Final    Chlamydophila pneumoniae NOT DETECTED NOT DETECTED Final   Mycoplasma pneumoniae NOT DETECTED NOT DETECTED Final    Comment: Performed at [facility], [place]. [address], [place], [place] [address]  Culture, blood (Routine X 2) w Reflex to ID Panel     Status: None   Collection Time: 10/29/20  2:56 PM   Specimen: BLOOD  Result Value Ref Range Status   Specimen Description BLOOD RIGHT ANTECUBITAL  Final   Special Requests   Final    BOTTLES DRAWN AEROBIC AND ANAEROBIC Blood Culture adequate volume   Culture   Final    NO GROWTH 5 DAYS Performed at [facility], [place] [address], [place], [place] [address]    Report Status 11/03/2020 FINAL  Final  Culture, blood (Routine X 2) w Reflex to ID Panel     Status: None   Collection Time: 10/29/20  2:57 PM   Specimen: BLOOD  Result Value Ref Range Status  Specimen Description BLOOD LEFT ANTECUBITAL  Final   Special Requests   Final    BOTTLES DRAWN AEROBIC AND ANAEROBIC Blood Culture adequate volume   Culture   Final    NO GROWTH 5 DAYS Performed at [facility], [address] [place], [place] [address]    Report Status 11/03/2020 FINAL  Final  Culture, blood (Routine X 2) w Reflex to ID Panel     Status: None   Collection Time: 10/30/20 10:01 AM   Specimen: BLOOD RIGHT HAND  Result Value Ref Range Status   Specimen Description BLOOD RIGHT HAND  Final   Special Requests   Final    BOTTLES DRAWN AEROBIC ONLY Blood Culture adequate volume   Culture   Final    NO GROWTH 5 DAYS Performed at [facility], [place] [address] [place], [place] [address]    Report Status 11/04/2020 FINAL  Final  Culture, blood (Routine X 2) w Reflex to ID Panel     Status: None   Collection Time: 10/30/20 10:12 AM   Specimen: BLOOD  Result Value Ref Range Status   Specimen Description BLOOD LEFT ANTECUBITAL  Final   Special Requests   Final    BOTTLES DRAWN AEROBIC ONLY Blood Culture results may not be optimal due to an inadequate volume  of blood received in culture bottles   Culture   Final    NO GROWTH 5 DAYS Performed at [facility], [place] [address] [place], [place] [address]    Report Status 11/04/2020 FINAL  Final    Procedures/Studies: CT ABDOMEN PELVIS WO CONTRAST  Result Date: 10/27/2020 CLINICAL DATA:  80 year old female with fever of unknown origin. EXAM: CT ABDOMEN AND PELVIS WITHOUT CONTRAST TECHNIQUE: Multidetector CT imaging of the abdomen and pelvis was performed following the standard protocol without IV contrast. COMPARISON:  CT abdomen pelvis dated 10/16/2014. FINDINGS: Evaluation of this exam is limited in the absence of intravenous contrast. Lower chest: There are bibasilar linear atelectasis/scarring. There is coronary vascular calcification. No intra-abdominal free air or free fluid. Hepatobiliary: The liver is unremarkable. No intrahepatic biliary ductal dilatation. There is layering sludge or small stones within the gallbladder. No pericholecystic fluid or evidence of acute cholecystitis by CT. Pancreas: Unremarkable. No pancreatic ductal dilatation or surrounding inflammatory changes. Spleen: Normal in size without focal abnormality. Adrenals/Urinary Tract: The adrenal glands are unremarkable. Atrophic left kidney. There is no hydronephrosis or nephrolithiasis on the right. The visualized ureters and urinary bladder appear unremarkable. Stomach/Bowel: There is moderate stool throughout the colon. Small scattered colonic diverticula without active inflammatory changes. There is no bowel obstruction or active inflammation. Appendectomy. Vascular/Lymphatic: Advanced aortoiliac atherosclerotic disease. The IVC is unremarkable. No portal venous gas. There is no adenopathy. Reproductive: Hysterectomy. No adnexal masses. Other: Postsurgical changes of anterior pelvic wall, likely related to prior hernia repair. There is a small fat containing umbilical hernia. No bowel herniation, fluid collection, or inflammatory  changes. Focal area of induration of the subcutaneous soft tissues of the left anterior pelvic wall, likely related to subcutaneous injection. Musculoskeletal: Degenerative changes of the spine. No acute osseous pathology. IMPRESSION: 1. No acute intra-abdominal or pelvic pathology. 2. Moderate colonic stool burden. No bowel obstruction. 3. Aortic Atherosclerosis (ICD10-I70.0). Electronically Signed   By: Anner Crete M.D.   On: 10/27/2020 20:19   CT ANGIO CHEST PE W OR WO CONTRAST  Result Date: 10/25/2020 CLINICAL DATA:  Shortness of breath. EXAM: CT ANGIOGRAPHY CHEST WITH CONTRAST TECHNIQUE: Multidetector  CT imaging of the chest was performed using the standard protocol during bolus administration of intravenous contrast. Multiplanar CT image reconstructions and MIPs were obtained to evaluate the vascular anatomy. CONTRAST:  50m OMNIPAQUE IOHEXOL 350 MG/ML SOLN COMPARISON:  September 06, 2019. FINDINGS: Cardiovascular: Satisfactory opacification of the pulmonary arteries to the segmental level. No evidence of pulmonary embolism. Normal heart size. No pericardial effusion. Atherosclerosis of thoracic aorta is noted without aneurysm formation. Mild coronary artery calcifications are noted. Mediastinum/Nodes: Stable 2 cm left thyroid nodule is noted. Esophagus is unremarkable. No adenopathy is noted. Lungs/Pleura: No pneumothorax or pleural effusion is noted. Mild bibasilar subsegmental atelectasis is noted. 7 mm nodule is noted in superior segment of right lower lobe best seen on image number 53 of series 6. Upper Abdomen: No acute abnormality. Musculoskeletal: No chest wall abnormality. No acute or significant osseous findings. Review of the MIP images confirms the above findings. IMPRESSION: No definite evidence of pulmonary embolus. 2 cm left thyroid nodule is noted. Recommend thyroid [place] (Ref: J Am Coll Radiol. 2015 Feb;12(2): 143-50). 7 mm nodule noted in right lower lobe. Non-contrast chest CT at 6-12  months is recommended. If the nodule is stable at time of repeat CT, then future CT at 18-24 months (from today's scan) is considered optional for low-risk patients, but is recommended for high-risk patients. This recommendation follows the consensus statement: Guidelines for Management of Incidental Pulmonary Nodules Detected on CT Images: From the Fleischner Society 2017; Radiology 2017; 284:228-243. Mild coronary calcifications are noted. Mild bibasilar subsegmental atelectasis is noted. Aortic Atherosclerosis (ICD10-I70.0). Electronically Signed   By: JMarijo ConceptionM.D.   On: 10/25/2020 13:44   DG Chest Portable 1 View  Result Date: 10/23/2020 CLINICAL DATA:  Cough and shortness of breath EXAM: PORTABLE CHEST 1 VIEW COMPARISON:  09/01/2020 FINDINGS: Cardiac shadow is stable. Previously seen right basilar infiltrate has resolved in the interval. No new focal infiltrate is seen. No bony abnormality is noted. Stable postsurgical changes in the cervical spine are noted. IMPRESSION: Resolution of previously seen right basilar pneumonia. Electronically Signed   By: MInez CatalinaM.D.   On: 10/23/2020 15:26   ECHOCARDIOGRAM COMPLETE  Result Date: 10/25/2020    ECHOCARDIOGRAM REPORT   Patient Name:   MRAYLIE MADDISONHILL Date of Exam: 10/25/2020 Medical Rec #:  [id number]  Height:       60.0 in Accession #:    [id number] Weight:       156.7 lb Date of Birth:  [birth year]  BSA:          1.683 m Patient Age:    89years    BP:           117/78 mmHg Patient Gender: F           HR:           111 bpm. Exam Location:  Inpatient Procedure: 2D Echo, Cardiac Doppler and Color Doppler Indications:    I50.9* Heart failure (unspecified)  History:        Patient has prior history of Echocardiogram examinations, most                 recent 08/26/2016. Signs/Symptoms:Shortness of Breath; Risk                 Factors:Hypertension.  Sonographer:    RMerrie RoofRDCS Referring Phys: [id number] 1. Left ventricular  ejection fraction, by estimation, is 60 to 65%. The left ventricle  has normal function. The left ventricle has no regional wall motion abnormalities. There is mild concentric left ventricular hypertrophy with focal discreet thickening of the basal septal segment. Left ventricular diastolic parameters are consistent with Grade I diastolic dysfunction (impaired relaxation).  2. Right ventricular systolic function is normal. The right ventricular size is normal. Tricuspid regurgitation signal is inadequate for assessing PA pressure.  3. The mitral valve is grossly normal. Trivial mitral valve regurgitation.  4. The aortic valve was not well visualized. Aortic valve regurgitation is not visualized. No aortic stenosis is present. Comparison(s): No prior Echocardiogram. FINDINGS  Left Ventricle: Left ventricular ejection fraction, by estimation, is 60 to 65%. The left ventricle has normal function. The left ventricle has no regional wall motion abnormalities. The left ventricular internal cavity size was normal in size. There is  mild concentric left ventricular hypertrophy with focal discreet thickening of the basal septal segment. Left ventricular diastolic parameters are consistent with Grade I diastolic dysfunction (impaired relaxation). Right Ventricle: The right ventricular size is normal. Right vetricular wall thickness was not well visualized. Right ventricular systolic function is normal. Tricuspid regurgitation signal is inadequate for assessing PA pressure. Left Atrium: Left atrial size was normal in size. Right Atrium: Right atrial size was normal in size. Pericardium: There is no evidence of pericardial effusion. Mitral Valve: The mitral valve is grossly normal. Trivial mitral valve regurgitation. Tricuspid Valve: The tricuspid valve is normal in structure. Tricuspid valve regurgitation is trivial. Aortic Valve: The aortic valve was not well visualized. Aortic valve regurgitation is not visualized. No aortic  stenosis is present. Pulmonic Valve: The pulmonic valve was not well visualized. Pulmonic valve regurgitation is trivial. Aorta: The aortic root and ascending aorta are structurally normal, with no evidence of dilitation. IAS/Shunts: No atrial level shunt detected by color flow Doppler.  LEFT VENTRICLE PLAX 2D LVIDd:         3.50 cm  Diastology LVIDs:         2.30 cm  LV e' medial:    6.85 cm/s LV PW:         1.00 cm  LV E/e' medial:  10.4 LV IVS:        1.00 cm  LV e' lateral:   7.40 cm/s LVOT diam:     2.00 cm  LV E/e' lateral: 9.6 LV SV:         45 LV SV Index:   27 LVOT Area:     3.14 cm  RIGHT VENTRICLE RV Basal diam:  2.90 cm LEFT ATRIUM             Index       RIGHT ATRIUM           Index LA diam:        3.00 cm 1.78 cm/m  RA Area:     10.90 cm LA Vol (A2C):   23.1 ml 13.73 ml/m RA Volume:   21.10 ml  12.54 ml/m LA Vol (A4C):   38.6 ml 22.94 ml/m LA Biplane Vol: 29.7 ml 17.65 ml/m  AORTIC VALVE LVOT Vmax:   93.30 cm/s LVOT Vmean:  64.700 cm/s LVOT VTI:    0.142 m  AORTA Ao Root diam: 2.60 cm Ao Asc diam:  3.00 cm MITRAL VALVE MV Area (PHT): 4.10 cm     SHUNTS MV Decel Time: 185 msec     Systemic VTI:  0.14 m MV E velocity: 71.10 cm/s   Systemic Diam: 2.00 cm MV A velocity: 131.00 cm/s  MV E/A ratio:  0.54 Gwyndolyn Kaufman MD Electronically signed by Gwyndolyn Kaufman MD Signature Date/Time: 10/25/2020/2:36:26 PM    Final    ECHO TEE  Result Date: 11/01/2020    TRANSESOPHOGEAL ECHO REPORT   Patient Name:   [name] [name] Date of Exam: 11/01/2020 Medical Rec #:  [id number]   Height:       60.0 in Accession #:    [id number]  Weight:       156.7 lb Date of Birth:  Aug 07, 1940   BSA:          1.683 m Patient Age:    15 years    BP:           131/56 mmHg Patient Gender: F           HR:           90 bpm. Exam Location:  Inpatient Procedure: Transesophageal Echo, Cardiac Doppler and Color Doppler Indications:     Bacteremia  History:         Patient has prior history of Echocardiogram examinations, most                   recent 10/25/2020. Signs/Symptoms:Shortness of Breath; Risk                  Factors:Hypertension.  Sonographer:     Clayton Lefort RDCS (AE) Referring Phys:  [id number] [place] Diagnosing Phys: Fransico Him MD PROCEDURE: After discussion of the risks and benefits of a TEE, an informed consent was obtained from the patient. The transesophogeal probe was passed without difficulty through the esophogus of the patient. Sedation performed by different physician. The patient was monitored while under deep sedation. Anesthestetic sedation was provided intravenously by Anesthesiology: 134.55m of Propofol. Image quality was good. The patient's vital signs; including heart rate, blood pressure, and oxygen saturation; remained stable throughout the procedure. The patient developed no complications during the procedure. IMPRESSIONS  1. Left ventricular ejection fraction, by estimation, is 60 to 65%. The left ventricle has normal function. The left ventricle has no regional wall motion abnormalities.  2. Right ventricular systolic function is normal. The right ventricular size is normal.  3. No left atrial/left atrial appendage thrombus was detected.  4. The mitral valve is normal in structure. Mild mitral valve regurgitation. No evidence of mitral stenosis.  5. The aortic valve is tricuspid. Aortic valve regurgitation is not visualized. Mild aortic valve sclerosis is present, with no evidence of aortic valve stenosis.  6. There is mild (Grade II) layered plaque involving the transverse aorta, descending aorta and ascending aorta.  7. The inferior vena cava is normal in size with greater than 50% respiratory variability, suggesting right atrial pressure of 3 mmHg. Conclusion(s)/Recommendation(s): Normal biventricular function without evidence of hemodynamically significant valvular heart disease. No vegetations noted. FINDINGS  Left Ventricle: Left ventricular ejection fraction, by estimation, is 60 to 65%. The left  ventricle has normal function. The left ventricle has no regional wall motion abnormalities. The left ventricular internal cavity size was normal in size. There is  no left ventricular hypertrophy. Right Ventricle: The right ventricular size is normal. No increase in right ventricular wall thickness. Right ventricular systolic function is normal. Left Atrium: Left atrial size was normal in size. No left atrial/left atrial appendage thrombus was detected. Right Atrium: Right atrial size was normal in size. Pericardium: There is no evidence of pericardial effusion. Mitral Valve: The mitral valve is normal in structure. Mild mitral valve  regurgitation. No evidence of mitral valve stenosis. Tricuspid Valve: The tricuspid valve is normal in structure. Tricuspid valve regurgitation is mild . No evidence of tricuspid stenosis. Aortic Valve: The aortic valve is tricuspid. Aortic valve regurgitation is not visualized. Mild aortic valve sclerosis is present, with no evidence of aortic valve stenosis. Pulmonic Valve: The pulmonic valve was normal in structure. Pulmonic valve regurgitation is trivial. No evidence of pulmonic stenosis. Aorta: The aortic root is normal in size and structure. There is mild (Grade II) layered plaque involving the transverse aorta, descending aorta and ascending aorta. Venous: The inferior vena cava is normal in size with greater than 50% respiratory variability, suggesting right atrial pressure of 3 mmHg. IAS/Shunts: The interatrial septum appears to be lipomatous. No atrial level shunt detected by color flow Doppler. Fransico Him MD Electronically signed by Fransico Him MD Signature Date/Time: 11/01/2020/11:42:04 AM    Final (Updated)    VAS [place] LOWER EXTREMITY VENOUS (DVT)  Result Date: 10/25/2020  Lower Venous DVT Study Indications: SOB, and Edema.  Limitations: Body habitus and skin texture. Comparison Study: No prior study on file Performing Technologist: Sharion Dove RVS  Examination  Guidelines: A complete evaluation includes B-mode imaging, spectral Doppler, color Doppler, and power Doppler as needed of all accessible portions of each vessel. Bilateral testing is considered an integral part of a complete examination. Limited examinations for reoccurring indications may be performed as noted. The reflux portion of the exam is performed with the patient in reverse Trendelenburg.  +---------+---------------+---------+-----------+----------+-------------------+ RIGHT    CompressibilityPhasicitySpontaneityPropertiesThrombus Aging      +---------+---------------+---------+-----------+----------+-------------------+ CFV      Full           Yes      Yes                                      +---------+---------------+---------+-----------+----------+-------------------+ SFJ      Full                                                             +---------+---------------+---------+-----------+----------+-------------------+ FV Prox  Full                                                             +---------+---------------+---------+-----------+----------+-------------------+ FV Mid   Full                                                             +---------+---------------+---------+-----------+----------+-------------------+ FV DistalFull                                                             +---------+---------------+---------+-----------+----------+-------------------+ PFV  Full                                                             +---------+---------------+---------+-----------+----------+-------------------+ POP      Full           Yes      Yes                                      +---------+---------------+---------+-----------+----------+-------------------+ PTV      Full                                                             +---------+---------------+---------+-----------+----------+-------------------+ PERO                                                   Not well visualized +---------+---------------+---------+-----------+----------+-------------------+   +---------+---------------+---------+-----------+----------+-------------------+ LEFT     CompressibilityPhasicitySpontaneityPropertiesThrombus Aging      +---------+---------------+---------+-----------+----------+-------------------+ CFV      Full           Yes      Yes                                      +---------+---------------+---------+-----------+----------+-------------------+ SFJ      Full                                                             +---------+---------------+---------+-----------+----------+-------------------+ FV Prox  Full                                                             +---------+---------------+---------+-----------+----------+-------------------+ FV Mid   Full                                                             +---------+---------------+---------+-----------+----------+-------------------+ FV DistalFull           Yes      Yes                                      +---------+---------------+---------+-----------+----------+-------------------+ PFV  Not well visualized +---------+---------------+---------+-----------+----------+-------------------+ POP      Full           Yes      Yes                                      +---------+---------------+---------+-----------+----------+-------------------+ PTV      Full                                                             +---------+---------------+---------+-----------+----------+-------------------+ PERO                                                  Not well visualized +---------+---------------+---------+-----------+----------+-------------------+     Summary: RIGHT: - There is no evidence of deep vein thrombosis in the lower extremity.  However, portions of this examination were limited- see technologist comments above.  LEFT: - There is no evidence of deep vein thrombosis in the lower extremity. However, portions of this examination were limited- see technologist comments above.  - Ultrasound characteristics of a ruptured Baker's Cyst are noted.  *See table(s) above for measurements and observations. Electronically signed by Ruta Hinds MD on 10/25/2020 at 12:53:40 PM.    Final    [place] EKG SITE RITE  Result Date: 11/01/2020 If Site Rite image not attached, placement could not be confirmed due to current cardiac rhythm.   Labs: BNP (last 3 results) Recent Labs    10/23/20 1426  BNP 60.4   Basic Metabolic Panel: Recent Labs  Lab 10/29/20 0722 10/30/20 0107 10/31/20 0220 11/01/20 0244 11/02/20 0402  NA 136 130* 133* 135 138  K 3.6 4.0 3.4* 3.9 3.8  CL 96* 92* 92* 97* 102  CO2 _0 GLUCOSE 93 99 106* 103* 95  BUN 23 30* 32* 28* 20  CREATININE 1.08* 1.19* 1.18* 1.25* 0.92  CALCIUM 9.1 8.7* 8.9 8.8* 9.0   Liver Function Tests: No results for input(s): AST, ALT, ALKPHOS, BILITOT, PROT, ALBUMIN in the last 168 hours. No results for input(s): LIPASE, AMYLASE in the last 168 hours. No results for input(s): AMMONIA in the last 168 hours. CBC: Recent Labs  Lab 10/31/20 0220 11/01/20 0244 11/02/20 0402 11/03/20 0113 11/04/20 0230  WBC 7.0 6.0 5.3 5.8 5.9  NEUTROABS 3.8 3.0 2.5 2.9 3.4  HGB 10.2* 9.4* 9.2* 9.2* 9.3*  HCT 29.4* 28.6* 27.7* 28.3* 28.2*  MCV 96.4 99.3 99.3 100.7* 98.3  PLT 250 257 254 303 325   Cardiac Enzymes: Recent Labs  Lab 10/29/20 0722  CKTOTAL 130   BNP: Invalid input(s): POCBNP CBG: No results for input(s): GLUCAP in the last 168 hours. D-Dimer No results for input(s): DDIMER in the last 72 hours. Hgb A1c No results for input(s): HGBA1C in the last 72 hours. Lipid Profile No results for input(s): CHOL, HDL, LDLCALC, TRIG, CHOLHDL, LDLDIRECT in the last 72  hours. Thyroid function studies No results for input(s): TSH, T4TOTAL, T3FREE, THYROIDAB in the last 72 hours.  Invalid input(s): FREET3 Anemia work up No results for input(s): VITAMINB12, FOLATE, FERRITIN, TIBC, IRON, RETICCTPCT  in the last 72 hours. Urinalysis    Component Value Date/Time   COLORURINE YELLOW 10/24/2020 0752   APPEARANCEUR CLEAR 10/24/2020 0752   LABSPEC 1.008 10/24/2020 0752   PHURINE 6.0 10/24/2020 0752   GLUCOSEU NEGATIVE 10/24/2020 0752   HGBUR NEGATIVE 10/24/2020 0752   BILIRUBINUR NEGATIVE 10/24/2020 0752   BILIRUBINUR negative 02/18/2020 0906   BILIRUBINUR NEG 06/08/2015 1447   KETONESUR 5 (A) 10/24/2020 0752   PROTEINUR NEGATIVE 10/24/2020 0752   UROBILINOGEN 0.2 02/18/2020 0906   UROBILINOGEN 0.2 10/19/2014 1619   NITRITE NEGATIVE 10/24/2020 0752   LEUKOCYTESUR NEGATIVE 10/24/2020 0752   Sepsis Labs Invalid input(s): PROCALCITONIN,  WBC,  LACTICIDVEN Microbiology Recent Results (from the past 240 hour(s))  Resp Panel by RT-PCR (Flu A&B, Covid) Nasopharyngeal Swab     Status: None   Collection Time: 10/27/20 10:54 AM   Specimen: Nasopharyngeal Swab; Nasopharyngeal(NP) swabs in vial transport medium  Result Value Ref Range Status   SARS Coronavirus 2 by RT PCR NEGATIVE NEGATIVE Final    Comment: (NOTE) SARS-CoV-2 target nucleic acids are NOT DETECTED.  The SARS-CoV-2 RNA is generally detectable in upper respiratory specimens during the acute phase of infection. The lowest concentration of SARS-CoV-2 viral copies this assay can detect is 138 copies/mL. A negative result does not preclude SARS-Cov-2 infection and should not be used as the sole basis for treatment or other patient management decisions. A negative result may occur with  improper specimen collection/handling, submission of specimen other than nasopharyngeal swab, presence of viral mutation(s) within the areas targeted by this assay, and inadequate number of viral copies(<138  copies/mL). A negative result must be combined with clinical observations, patient history, and epidemiological information. The expected result is Negative.  Fact Sheet for Patients:  EntrepreneurPulse.com.au  Fact Sheet for Healthcare Providers:  IncredibleEmployment.be  This test is no t yet approved or cleared by the [place] FDA and  has been authorized for detection and/or diagnosis of SARS-CoV-2 by FDA under an Emergency Use Authorization (EUA). This EUA will remain  in effect (meaning this test can be used) for the duration of the COVID-19 declaration under Section 564(b)(1) of the Act, 21 U.S.C.section 360bbb-3(b)(1), unless the authorization is terminated  or revoked sooner.       Influenza A by PCR NEGATIVE NEGATIVE Final   Influenza B by PCR NEGATIVE NEGATIVE Final    Comment: (NOTE) The Xpert Xpress SARS-CoV-2/FLU/RSV plus assay is intended as an aid in the diagnosis of influenza from Nasopharyngeal swab specimens and should not be used as a sole basis for treatment. Nasal washings and aspirates are unacceptable for Xpert Xpress SARS-CoV-2/FLU/RSV testing.  Fact Sheet for Patients: EntrepreneurPulse.com.au  Fact Sheet for Healthcare Providers: IncredibleEmployment.be  This test is not yet approved or cleared by the [place] FDA and has been authorized for detection and/or diagnosis of SARS-CoV-2 by FDA under an Emergency Use Authorization (EUA). This EUA will remain in effect (meaning this test can be used) for the duration of the COVID-19 declaration under Section 564(b)(1) of the Act, 21 U.S.C. section 360bbb-3(b)(1), unless the authorization is terminated or revoked.  Performed at [facility], [place] [address], [place], [place] [address]   Culture, blood (routine x 2)     Status: None   Collection Time: 10/27/20  2:00 PM   Specimen: BLOOD  Result Value Ref Range Status    Specimen Description BLOOD LEFT ANTECUBITAL  Final   Special Requests   Final    BOTTLES DRAWN AEROBIC  AND ANAEROBIC Blood Culture adequate volume   Culture   Final    NO GROWTH 5 DAYS Performed at [facility], [place] [address], [place], [place] [address]    Report Status 11/01/2020 FINAL  Final  Culture, blood (routine x 2)     Status: Abnormal   Collection Time: 10/27/20  2:05 PM   Specimen: BLOOD  Result Value Ref Range Status   Specimen Description BLOOD RIGHT ANTECUBITAL  Final   Special Requests   Final    BOTTLES DRAWN AEROBIC AND ANAEROBIC Blood Culture adequate volume   Culture  Setup Time   Final    GRAM POSITIVE COCCI IN CLUSTERS AEROBIC BOTTLE ONLY CRITICAL RESULT CALLED TO, READ BACK BY AND VERIFIED WITH: PHARMD JEREMY FRENN AT 0488 ON 10/28/20 BY KJ Performed at [facility], [place] [address], [place],  [address]    Culture METHICILLIN RESISTANT STAPHYLOCOCCUS AUREUS (A)  Final   Report Status 10/30/2020 FINAL  Final   Organism ID, Bacteria METHICILLIN RESISTANT STAPHYLOCOCCUS AUREUS  Final      Susceptibility   Methicillin resistant staphylococcus aureus - MIC*    CIPROFLOXACIN >=8 RESISTANT Resistant     ERYTHROMYCIN >=8 RESISTANT Resistant     GENTAMICIN <=0.5 SENSITIVE Sensitive     OXACILLIN >=4 RESISTANT Resistant     TETRACYCLINE <=1 SENSITIVE Sensitive     VANCOMYCIN <=0.5 SENSITIVE Sensitive     TRIMETH/SULFA <=10 SENSITIVE Sensitive     CLINDAMYCIN <=0.25 SENSITIVE Sensitive     RIFAMPIN <=0.5 SENSITIVE Sensitive     Inducible Clindamycin NEGATIVE Sensitive     * METHICILLIN RESISTANT STAPHYLOCOCCUS AUREUS  Blood Culture ID Panel (Reflexed)     Status: Abnormal   Collection Time: 10/27/20  2:05 PM  Result Value Ref Range Status   Enterococcus faecalis NOT DETECTED NOT DETECTED Final   Enterococcus Faecium NOT DETECTED NOT DETECTED Final   Listeria monocytogenes NOT DETECTED NOT DETECTED Final   Staphylococcus species DETECTED (A)  NOT DETECTED Final    Comment: CRITICAL RESULT CALLED TO, READ BACK BY AND VERIFIED WITH: PHARMD JEREMY FRENN AT 0919 BY KJ ON 10/28/20    Staphylococcus aureus (BCID) DETECTED (A) NOT DETECTED Final    Comment: Methicillin (oxacillin)-resistant Staphylococcus aureus (MRSA). MRSA is predictably resistant to beta-lactam antibiotics (except ceftaroline). Preferred therapy is vancomycin unless clinically contraindicated. Patient requires contact precautions if  hospitalized. CRITICAL RESULT CALLED TO, READ BACK BY AND VERIFIED WITH: [place] AT 0919 ON 10/28/20 BY KJ    Staphylococcus epidermidis NOT DETECTED NOT DETECTED Final   Staphylococcus lugdunensis NOT DETECTED NOT DETECTED Final   Streptococcus species NOT DETECTED NOT DETECTED Final   Streptococcus agalactiae NOT DETECTED NOT DETECTED Final   Streptococcus pneumoniae NOT DETECTED NOT DETECTED Final   Streptococcus pyogenes NOT DETECTED NOT DETECTED Final   A.calcoaceticus-baumannii NOT DETECTED NOT DETECTED Final   Bacteroides fragilis NOT DETECTED NOT DETECTED Final   Enterobacterales NOT DETECTED NOT DETECTED Final   Enterobacter cloacae complex NOT DETECTED NOT DETECTED Final   Escherichia coli NOT DETECTED NOT DETECTED Final   Klebsiella aerogenes NOT DETECTED NOT DETECTED Final   Klebsiella oxytoca NOT DETECTED NOT DETECTED Final   Klebsiella pneumoniae NOT DETECTED NOT DETECTED Final   Proteus species NOT DETECTED NOT DETECTED Final   Salmonella species NOT DETECTED NOT DETECTED Final   Serratia marcescens NOT DETECTED NOT DETECTED Final   Haemophilus influenzae NOT DETECTED NOT DETECTED Final   Neisseria meningitidis NOT DETECTED NOT  DETECTED Final   Pseudomonas aeruginosa NOT DETECTED NOT DETECTED Final   Stenotrophomonas maltophilia NOT DETECTED NOT DETECTED Final   Candida albicans NOT DETECTED NOT DETECTED Final   Candida auris NOT DETECTED NOT DETECTED Final   Candida glabrata NOT DETECTED NOT DETECTED  Final   Candida krusei NOT DETECTED NOT DETECTED Final   Candida parapsilosis NOT DETECTED NOT DETECTED Final   Candida tropicalis NOT DETECTED NOT DETECTED Final   Cryptococcus neoformans/gattii NOT DETECTED NOT DETECTED Final   Meth resistant mecA/C and MREJ DETECTED (A) NOT DETECTED Final    Comment: CRITICAL RESULT CALLED TO, READ BACK BY AND VERIFIED WITH: PHARMD JEREMY FRENN AT 7494 ON 10/28/20 BY KJ Performed at [place] [address] [place], [place] [address]   Respiratory (~20 pathogens) panel by PCR     Status: None   Collection Time: 10/27/20  5:00 PM   Specimen: Nasopharyngeal Swab; Respiratory  Result Value Ref Range Status   Adenovirus NOT DETECTED NOT DETECTED Final   Coronavirus 229E NOT DETECTED NOT DETECTED Final    Comment: (NOTE) The Coronavirus on the Respiratory Panel, DOES NOT test for the novel  Coronavirus (2019 nCoV)    Coronavirus HKU1 NOT DETECTED NOT DETECTED Final   Coronavirus NL63 NOT DETECTED NOT DETECTED Final   Coronavirus OC43 NOT DETECTED NOT DETECTED Final   Metapneumovirus NOT DETECTED NOT DETECTED Final   Rhinovirus / Enterovirus NOT DETECTED NOT DETECTED Final   Influenza A NOT DETECTED NOT DETECTED Final   Influenza B NOT DETECTED NOT DETECTED Final   Parainfluenza Virus 1 NOT DETECTED NOT DETECTED Final   Parainfluenza Virus 2 NOT DETECTED NOT DETECTED Final   Parainfluenza Virus 3 NOT DETECTED NOT DETECTED Final   Parainfluenza Virus 4 NOT DETECTED NOT DETECTED Final   Respiratory Syncytial Virus NOT DETECTED NOT DETECTED Final   Bordetella pertussis NOT DETECTED NOT DETECTED Final   Bordetella Parapertussis NOT DETECTED NOT DETECTED Final   Chlamydophila pneumoniae NOT DETECTED NOT DETECTED Final   Mycoplasma pneumoniae NOT DETECTED NOT DETECTED Final    Comment: Performed at [facility], [place]. [address], [place], [place] [address]  Culture, blood (Routine X 2) w Reflex to ID Panel     Status: None   Collection  Time: 10/29/20  2:56 PM   Specimen: BLOOD  Result Value Ref Range Status   Specimen Description BLOOD RIGHT ANTECUBITAL  Final   Special Requests   Final    BOTTLES DRAWN AEROBIC AND ANAEROBIC Blood Culture adequate volume   Culture   Final    NO GROWTH 5 DAYS Performed at [facility], [place] [address], [place], [place] [address]    Report Status 11/03/2020 FINAL  Final  Culture, blood (Routine X 2) w Reflex to ID Panel     Status: None   Collection Time: 10/29/20  2:57 PM   Specimen: BLOOD  Result Value Ref Range Status   Specimen Description BLOOD LEFT ANTECUBITAL  Final   Special Requests   Final    BOTTLES DRAWN AEROBIC AND ANAEROBIC Blood Culture adequate volume   Culture   Final    NO GROWTH 5 DAYS Performed at [facility], [place] [address], [place], [place] [address]    Report Status 11/03/2020 FINAL  Final  Culture, blood (Routine X 2) w Reflex to ID Panel     Status: None   Collection Time: 10/30/20 10:01 AM   Specimen: BLOOD RIGHT HAND  Result  Value Ref Range Status   Specimen Description BLOOD RIGHT HAND  Final   Special Requests   Final    BOTTLES DRAWN AEROBIC ONLY Blood Culture adequate volume   Culture   Final    NO GROWTH 5 DAYS Performed at [facility], [address], [place], [place] [address]    Report Status 11/04/2020 FINAL  Final  Culture, blood (Routine X 2) w Reflex to ID Panel     Status: None   Collection Time: 10/30/20 10:12 AM   Specimen: BLOOD  Result Value Ref Range Status   Specimen Description BLOOD LEFT ANTECUBITAL  Final   Special Requests   Final    BOTTLES DRAWN AEROBIC ONLY Blood Culture results may not be optimal due to an inadequate volume of blood received in culture bottles   Culture   Final    NO GROWTH 5 DAYS Performed at [facility], [place] [address], [place], [place] [address]    Report Status 11/04/2020 FINAL  Final     Time coordinating discharge: 35 minutes  SIGNED: Antonieta Pert, MD  [facility] 11/04/2020, 10:56 AM  If 7PM-7AM, please contact night-coverage [email or web address]

## 2020-11-05 DIAGNOSIS — Z5181 Encounter for therapeutic drug level monitoring: Secondary | ICD-10-CM | POA: Diagnosis not present

## 2020-11-05 DIAGNOSIS — R0602 Shortness of breath: Secondary | ICD-10-CM | POA: Diagnosis not present

## 2020-11-05 DIAGNOSIS — K5900 Constipation, unspecified: Secondary | ICD-10-CM | POA: Diagnosis not present

## 2020-11-05 DIAGNOSIS — Y95 Nosocomial condition: Secondary | ICD-10-CM | POA: Diagnosis present

## 2020-11-05 DIAGNOSIS — L089 Local infection of the skin and subcutaneous tissue, unspecified: Secondary | ICD-10-CM | POA: Diagnosis not present

## 2020-11-05 DIAGNOSIS — M069 Rheumatoid arthritis, unspecified: Secondary | ICD-10-CM | POA: Diagnosis not present

## 2020-11-05 DIAGNOSIS — I76 Septic arterial embolism: Secondary | ICD-10-CM | POA: Diagnosis not present

## 2020-11-05 DIAGNOSIS — M79604 Pain in right leg: Secondary | ICD-10-CM | POA: Diagnosis not present

## 2020-11-05 DIAGNOSIS — Q600 Renal agenesis, unilateral: Secondary | ICD-10-CM | POA: Diagnosis not present

## 2020-11-05 DIAGNOSIS — R000 Tachycardia, unspecified: Secondary | ICD-10-CM | POA: Diagnosis not present

## 2020-11-05 DIAGNOSIS — R6520 Severe sepsis without septic shock: Secondary | ICD-10-CM | POA: Diagnosis not present

## 2020-11-05 DIAGNOSIS — B9562 Methicillin resistant Staphylococcus aureus infection as the cause of diseases classified elsewhere: Secondary | ICD-10-CM | POA: Diagnosis not present

## 2020-11-05 DIAGNOSIS — S81802A Unspecified open wound, left lower leg, initial encounter: Secondary | ICD-10-CM | POA: Diagnosis not present

## 2020-11-05 DIAGNOSIS — D696 Thrombocytopenia, unspecified: Secondary | ICD-10-CM | POA: Diagnosis not present

## 2020-11-05 DIAGNOSIS — R0902 Hypoxemia: Secondary | ICD-10-CM | POA: Diagnosis not present

## 2020-11-05 DIAGNOSIS — A4902 Methicillin resistant Staphylococcus aureus infection, unspecified site: Secondary | ICD-10-CM | POA: Diagnosis not present

## 2020-11-05 DIAGNOSIS — I872 Venous insufficiency (chronic) (peripheral): Secondary | ICD-10-CM | POA: Diagnosis not present

## 2020-11-05 DIAGNOSIS — E7800 Pure hypercholesterolemia, unspecified: Secondary | ICD-10-CM | POA: Diagnosis not present

## 2020-11-05 DIAGNOSIS — E43 Unspecified severe protein-calorie malnutrition: Secondary | ICD-10-CM | POA: Diagnosis not present

## 2020-11-05 DIAGNOSIS — L97919 Non-pressure chronic ulcer of unspecified part of right lower leg with unspecified severity: Secondary | ICD-10-CM | POA: Diagnosis not present

## 2020-11-05 DIAGNOSIS — N183 Chronic kidney disease, stage 3 unspecified: Secondary | ICD-10-CM | POA: Diagnosis not present

## 2020-11-05 DIAGNOSIS — J189 Pneumonia, unspecified organism: Secondary | ICD-10-CM | POA: Diagnosis not present

## 2020-11-05 DIAGNOSIS — H401133 Primary open-angle glaucoma, bilateral, severe stage: Secondary | ICD-10-CM | POA: Diagnosis not present

## 2020-11-05 DIAGNOSIS — E8809 Other disorders of plasma-protein metabolism, not elsewhere classified: Secondary | ICD-10-CM | POA: Diagnosis not present

## 2020-11-05 DIAGNOSIS — E669 Obesity, unspecified: Secondary | ICD-10-CM | POA: Diagnosis not present

## 2020-11-05 DIAGNOSIS — Z743 Need for continuous supervision: Secondary | ICD-10-CM | POA: Diagnosis not present

## 2020-11-05 DIAGNOSIS — M7989 Other specified soft tissue disorders: Secondary | ICD-10-CM | POA: Diagnosis not present

## 2020-11-05 DIAGNOSIS — M79602 Pain in left arm: Secondary | ICD-10-CM | POA: Diagnosis not present

## 2020-11-05 DIAGNOSIS — I129 Hypertensive chronic kidney disease with stage 1 through stage 4 chronic kidney disease, or unspecified chronic kidney disease: Secondary | ICD-10-CM | POA: Diagnosis not present

## 2020-11-05 DIAGNOSIS — R519 Headache, unspecified: Secondary | ICD-10-CM | POA: Diagnosis not present

## 2020-11-05 DIAGNOSIS — I330 Acute and subacute infective endocarditis: Secondary | ICD-10-CM | POA: Diagnosis not present

## 2020-11-05 DIAGNOSIS — Z20822 Contact with and (suspected) exposure to covid-19: Secondary | ICD-10-CM | POA: Diagnosis not present

## 2020-11-05 DIAGNOSIS — E871 Hypo-osmolality and hyponatremia: Secondary | ICD-10-CM | POA: Diagnosis not present

## 2020-11-05 DIAGNOSIS — I10 Essential (primary) hypertension: Secondary | ICD-10-CM | POA: Diagnosis not present

## 2020-11-05 DIAGNOSIS — C4491 Basal cell carcinoma of skin, unspecified: Secondary | ICD-10-CM | POA: Diagnosis not present

## 2020-11-05 DIAGNOSIS — I2690 Septic pulmonary embolism without acute cor pulmonale: Secondary | ICD-10-CM | POA: Diagnosis not present

## 2020-11-05 DIAGNOSIS — F419 Anxiety disorder, unspecified: Secondary | ICD-10-CM | POA: Diagnosis not present

## 2020-11-05 DIAGNOSIS — I739 Peripheral vascular disease, unspecified: Secondary | ICD-10-CM | POA: Diagnosis not present

## 2020-11-05 DIAGNOSIS — J15212 Pneumonia due to Methicillin resistant Staphylococcus aureus: Secondary | ICD-10-CM | POA: Diagnosis not present

## 2020-11-05 DIAGNOSIS — R5381 Other malaise: Secondary | ICD-10-CM | POA: Diagnosis not present

## 2020-11-05 DIAGNOSIS — E782 Mixed hyperlipidemia: Secondary | ICD-10-CM | POA: Diagnosis not present

## 2020-11-05 DIAGNOSIS — N179 Acute kidney failure, unspecified: Secondary | ICD-10-CM | POA: Diagnosis not present

## 2020-11-05 DIAGNOSIS — F332 Major depressive disorder, recurrent severe without psychotic features: Secondary | ICD-10-CM | POA: Diagnosis not present

## 2020-11-05 DIAGNOSIS — G629 Polyneuropathy, unspecified: Secondary | ICD-10-CM | POA: Diagnosis not present

## 2020-11-05 DIAGNOSIS — F32A Depression, unspecified: Secondary | ICD-10-CM | POA: Diagnosis not present

## 2020-11-05 DIAGNOSIS — M6281 Muscle weakness (generalized): Secondary | ICD-10-CM | POA: Diagnosis not present

## 2020-11-05 DIAGNOSIS — L409 Psoriasis, unspecified: Secondary | ICD-10-CM | POA: Diagnosis not present

## 2020-11-05 DIAGNOSIS — K219 Gastro-esophageal reflux disease without esophagitis: Secondary | ICD-10-CM | POA: Diagnosis not present

## 2020-11-05 DIAGNOSIS — A419 Sepsis, unspecified organism: Secondary | ICD-10-CM | POA: Diagnosis not present

## 2020-11-05 DIAGNOSIS — I2510 Atherosclerotic heart disease of native coronary artery without angina pectoris: Secondary | ICD-10-CM | POA: Diagnosis not present

## 2020-11-05 DIAGNOSIS — J9811 Atelectasis: Secondary | ICD-10-CM | POA: Diagnosis not present

## 2020-11-05 DIAGNOSIS — Q602 Renal agenesis, unspecified: Secondary | ICD-10-CM | POA: Diagnosis not present

## 2020-11-05 DIAGNOSIS — M353 Polymyalgia rheumatica: Secondary | ICD-10-CM | POA: Diagnosis not present

## 2020-11-05 DIAGNOSIS — F40240 Claustrophobia: Secondary | ICD-10-CM | POA: Diagnosis not present

## 2020-11-05 DIAGNOSIS — G4733 Obstructive sleep apnea (adult) (pediatric): Secondary | ICD-10-CM | POA: Diagnosis not present

## 2020-11-05 DIAGNOSIS — R41841 Cognitive communication deficit: Secondary | ICD-10-CM | POA: Diagnosis not present

## 2020-11-05 DIAGNOSIS — S81801A Unspecified open wound, right lower leg, initial encounter: Secondary | ICD-10-CM | POA: Diagnosis not present

## 2020-11-05 DIAGNOSIS — Z66 Do not resuscitate: Secondary | ICD-10-CM | POA: Diagnosis not present

## 2020-11-05 DIAGNOSIS — I38 Endocarditis, valve unspecified: Secondary | ICD-10-CM | POA: Diagnosis not present

## 2020-11-05 DIAGNOSIS — I87319 Chronic venous hypertension (idiopathic) with ulcer of unspecified lower extremity: Secondary | ICD-10-CM | POA: Diagnosis not present

## 2020-11-05 DIAGNOSIS — R279 Unspecified lack of coordination: Secondary | ICD-10-CM | POA: Diagnosis not present

## 2020-11-05 DIAGNOSIS — R338 Other retention of urine: Secondary | ICD-10-CM | POA: Diagnosis not present

## 2020-11-05 DIAGNOSIS — R7881 Bacteremia: Secondary | ICD-10-CM | POA: Diagnosis not present

## 2020-11-05 DIAGNOSIS — N1832 Chronic kidney disease, stage 3b: Secondary | ICD-10-CM | POA: Diagnosis not present

## 2020-11-05 DIAGNOSIS — I87312 Chronic venous hypertension (idiopathic) with ulcer of left lower extremity: Secondary | ICD-10-CM | POA: Diagnosis not present

## 2020-11-05 DIAGNOSIS — I959 Hypotension, unspecified: Secondary | ICD-10-CM | POA: Diagnosis not present

## 2020-11-05 DIAGNOSIS — I87311 Chronic venous hypertension (idiopathic) with ulcer of right lower extremity: Secondary | ICD-10-CM | POA: Diagnosis not present

## 2020-11-05 DIAGNOSIS — R627 Adult failure to thrive: Secondary | ICD-10-CM | POA: Diagnosis not present

## 2020-11-05 DIAGNOSIS — F411 Generalized anxiety disorder: Secondary | ICD-10-CM | POA: Diagnosis not present

## 2020-11-05 DIAGNOSIS — I340 Nonrheumatic mitral (valve) insufficiency: Secondary | ICD-10-CM | POA: Diagnosis not present

## 2020-11-05 DIAGNOSIS — J9601 Acute respiratory failure with hypoxia: Secondary | ICD-10-CM | POA: Diagnosis not present

## 2020-11-05 DIAGNOSIS — D539 Nutritional anemia, unspecified: Secondary | ICD-10-CM | POA: Diagnosis not present

## 2020-11-05 DIAGNOSIS — M057A Rheumatoid arthritis with rheumatoid factor of other specified site without organ or systems involvement: Secondary | ICD-10-CM | POA: Diagnosis not present

## 2020-11-05 DIAGNOSIS — J929 Pleural plaque without asbestos: Secondary | ICD-10-CM | POA: Diagnosis not present

## 2020-11-05 LAB — CBC WITH DIFFERENTIAL/PLATELET
Abs Immature Granulocytes: 0.04 10*3/uL (ref 0.00–0.07)
Basophils Absolute: 0 10*3/uL (ref 0.0–0.1)
Basophils Relative: 0 %
Eosinophils Absolute: 0.4 10*3/uL (ref 0.0–0.5)
Eosinophils Relative: 7 %
HCT: 27.9 % — ABNORMAL LOW (ref 36.0–46.0)
Hemoglobin: 9.5 g/dL — ABNORMAL LOW (ref 12.0–15.0)
Immature Granulocytes: 1 %
Lymphocytes Relative: 35 %
Lymphs Abs: 1.9 10*3/uL (ref 0.7–4.0)
MCH: 33.7 pg (ref 26.0–34.0)
MCHC: 34.1 g/dL (ref 30.0–36.0)
MCV: 98.9 fL (ref 80.0–100.0)
Monocytes Absolute: 0.5 10*3/uL (ref 0.1–1.0)
Monocytes Relative: 9 %
Neutro Abs: 2.6 10*3/uL (ref 1.7–7.7)
Neutrophils Relative %: 48 %
Platelets: 297 10*3/uL (ref 150–400)
RBC: 2.82 MIL/uL — ABNORMAL LOW (ref 3.87–5.11)
RDW: 14.6 % (ref 11.5–15.5)
WBC: 5.4 10*3/uL (ref 4.0–10.5)
nRBC: 0 % (ref 0.0–0.2)

## 2020-11-05 LAB — CK: Total CK: 26 U/L — ABNORMAL LOW (ref 38–234)

## 2020-11-05 NOTE — Progress Notes (Signed)
Patient belongings gathered and placed in bag including cell phone, charger and cord as well as glasses and clothes. PTAR here for patient for patient trasnport. PICC still in place as patient is in need of long term ABX at facility.

## 2020-11-05 NOTE — Progress Notes (Signed)
PT Cancellation Note  Patient Details Name: [name] [name] MRN: [id number] DOB: May 23, 1941   Cancelled Treatment:    Reason Eval/Treat Not Completed: Other (comment) (Pt has bed at SNF per chart.  D/C today.)   Alvira Philips 11/05/2020, 3:42 PM Marelly Wehrman M,PT Acute Rehab Services [phone] (pager)

## 2020-11-05 NOTE — TOC Transition Note (Signed)
Transition of Care [facility]) - CM/SW Discharge Note   Patient Details  Name: [name] [name] MRN: [id number] Date of Birth: Nov 30, 1940  Transition of Care [facility]) CM/SW Contact:  Joanne Chars, LCSW Phone Number: 11/05/2020, 12:26 PM   Clinical Narrative:   Pt discharging to [facility], room 107.  RN call report to [phone].    Final next level of care: Skilled Nursing Facility Barriers to Discharge: Barriers Resolved   Patient Goals and CMS Choice Patient states their goals for this hospitalization and ongoing recovery are:: "go back to [facility]" CMS Medicare.gov Compare Post Acute Care list provided to:: Patient Choice offered to / list presented to : Patient  Discharge Placement              Patient chooses bed at: [facility] Patient to be transferred to facility by: [place] Name of family member notified: daughter [name] Patient and family notified of of transfer: 11/05/20  Discharge Plan and Services     Post Acute Care Choice: [place]                               Social Determinants of Health (SDOH) Interventions     Readmission Risk Interventions No flowsheet data found.

## 2020-11-05 NOTE — Progress Notes (Signed)
Patient seen and examined. She is resting comfortably on room air no new complaints afebrile. She is medically stable for discharge to skilled nursing facility as planned to continue antibiotics and PT OT. Discharge summary is reviewed from 4/28

## 2020-11-05 NOTE — Progress Notes (Signed)
Report called to [place] at [facility].

## 2020-11-08 DIAGNOSIS — M79604 Pain in right leg: Secondary | ICD-10-CM | POA: Diagnosis not present

## 2020-11-08 DIAGNOSIS — A4902 Methicillin resistant Staphylococcus aureus infection, unspecified site: Secondary | ICD-10-CM | POA: Diagnosis not present

## 2020-11-08 DIAGNOSIS — S81802A Unspecified open wound, left lower leg, initial encounter: Secondary | ICD-10-CM | POA: Diagnosis not present

## 2020-11-08 DIAGNOSIS — S81801A Unspecified open wound, right lower leg, initial encounter: Secondary | ICD-10-CM | POA: Diagnosis not present

## 2020-11-10 DIAGNOSIS — I739 Peripheral vascular disease, unspecified: Secondary | ICD-10-CM | POA: Diagnosis not present

## 2020-11-10 DIAGNOSIS — I87319 Chronic venous hypertension (idiopathic) with ulcer of unspecified lower extremity: Secondary | ICD-10-CM | POA: Diagnosis not present

## 2020-11-10 DIAGNOSIS — I87312 Chronic venous hypertension (idiopathic) with ulcer of left lower extremity: Secondary | ICD-10-CM | POA: Diagnosis not present

## 2020-11-10 DIAGNOSIS — I87311 Chronic venous hypertension (idiopathic) with ulcer of right lower extremity: Secondary | ICD-10-CM | POA: Diagnosis not present

## 2020-11-12 ENCOUNTER — Inpatient Hospital Stay: Payer: PPO | Admitting: Infectious Diseases

## 2020-11-15 ENCOUNTER — Ambulatory Visit (INDEPENDENT_AMBULATORY_CARE_PROVIDER_SITE_OTHER): Payer: PPO | Admitting: Internal Medicine

## 2020-11-15 ENCOUNTER — Other Ambulatory Visit: Payer: Self-pay

## 2020-11-15 ENCOUNTER — Encounter: Payer: Self-pay | Admitting: Internal Medicine

## 2020-11-15 VITALS — BP 139/83 | HR 114 | Ht 60.0 in | Wt 140.0 lb

## 2020-11-15 DIAGNOSIS — M353 Polymyalgia rheumatica: Secondary | ICD-10-CM | POA: Diagnosis not present

## 2020-11-15 DIAGNOSIS — B9562 Methicillin resistant Staphylococcus aureus infection as the cause of diseases classified elsewhere: Secondary | ICD-10-CM | POA: Diagnosis not present

## 2020-11-15 DIAGNOSIS — R7881 Bacteremia: Secondary | ICD-10-CM | POA: Diagnosis not present

## 2020-11-15 NOTE — Progress Notes (Signed)
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                                                             +---------+---------------+---------+-----------+----------+-------------------+ FV Prox  Full                                                             +---------+---------------+---------+-----------+----------+-------------------+  FV Mid   Full                                                             +---------+---------------+---------+-----------+----------+-------------------+ FV DistalFull                                                             +---------+---------------+---------+-----------+----------+-------------------+ PFV      Full                                                             +---------+---------------+---------+-----------+----------+-------------------+ POP      Full           Yes      Yes                                      +---------+---------------+---------+-----------+----------+-------------------+ PTV      Full                                                             +---------+---------------+---------+-----------+----------+-------------------+ PERO                                                  Not well visualized  +---------+---------------+---------+-----------+----------+-------------------+   +---------+---------------+---------+-----------+----------+-------------------+ LEFT     CompressibilityPhasicitySpontaneityPropertiesThrombus Aging      +---------+---------------+---------+-----------+----------+-------------------+ CFV      Full           Yes      Yes                                      +---------+---------------+---------+-----------+----------+-------------------+ SFJ      Full                                                             +---------+---------------+---------+-----------+----------+-------------------+ FV Prox  Full                                                             +---------+---------------+---------+-----------+----------+-------------------+ FV Mid   Full                                                             +---------+---------------+---------+-----------+----------+-------------------+  FV DistalFull           Yes      Yes                                      +---------+---------------+---------+-----------+----------+-------------------+ PFV                                                   Not well visualized +---------+---------------+---------+-----------+----------+-------------------+ POP      Full           Yes      Yes                                      +---------+---------------+---------+-----------+----------+-------------------+ PTV      Full                                                             +---------+---------------+---------+-----------+----------+-------------------+ PERO                                                  Not well visualized +---------+---------------+---------+-----------+----------+-------------------+     Summary: RIGHT: - There is no evidence of deep vein thrombosis in the lower extremity. However, portions of this examination were limited- see technologist  comments above.  LEFT: - There is no evidence of deep vein thrombosis in the lower extremity. However, portions of this examination were limited- see technologist comments above.  - Ultrasound characteristics of a ruptured Baker's Cyst are noted.  *See table(s) above for measurements and observations. Electronically signed by Ruta Hinds MD on 10/25/2020 at 12:53:40 PM.    Final    [place] EKG SITE RITE  Result Date: 11/01/2020 If Site Rite image not attached, placement could not be confirmed due to current cardiac rhythm.   Recent Labs: Lab Results  Component Value Date   WBC 5.4 11/05/2020   HGB 9.5 (L) 11/05/2020   PLT 297 11/05/2020   NA 138 11/02/2020   K 3.8 11/02/2020   CL 102 11/02/2020   CO2 29 11/02/2020   GLUCOSE 95 11/02/2020   BUN 20 11/02/2020   CREATININE 0.92 11/02/2020   BILITOT 0.5 10/28/2020   ALKPHOS 51 10/28/2020   AST 32 10/28/2020   ALT 11 10/28/2020   PROT 6.0 (L) 10/28/2020   ALBUMIN 2.5 (L) 10/28/2020   CALCIUM 9.0 11/02/2020   GFRAA >60 09/08/2019    Speciality Comments: No specialty comments available.  Procedures:  No procedures performed Allergies: Statins, Gabapentin, and Statins   Assessment / Plan:     Visit Diagnoses: Polymyalgia rheumatica ([place])  She has a history of PMR with longstanding symptoms of sneezing of stiffness and proximal joint dysfunction she is has been on prednisone in the past and also hydroxychloroquine but was off any treatment more recently prior to the current infection episode and possible complications.  No obvious  plaints consistent with this today and inflammatory markers are confounded with the recent infections.  He is not concerned for any acute deterioration or complications coming from this so recommend observation and follow-up in a few weeks further out from infection.  MRSA bacteremia  She still in the process of completing treatment with outpatient intravenous antibiotics with PICC line in  place.  Orders: No orders of the defined types were placed in this encounter.   Follow-Up Instructions: Return in about 4 weeks (around 12/13/2020).   Collier Salina, MD  Note - This record has been created using [employer].  Chart creation errors have been sought, but may not always  have been located. Such creation errors do not reflect on  the standard of medical care.

## 2020-11-16 ENCOUNTER — Encounter: Payer: Self-pay | Admitting: Infectious Diseases

## 2020-11-16 ENCOUNTER — Other Ambulatory Visit: Payer: Self-pay

## 2020-11-16 ENCOUNTER — Ambulatory Visit (INDEPENDENT_AMBULATORY_CARE_PROVIDER_SITE_OTHER): Payer: PPO | Admitting: Infectious Diseases

## 2020-11-16 VITALS — BP 125/79 | HR 122 | Temp 98.9°F | Resp 16 | Ht 60.0 in | Wt 140.0 lb

## 2020-11-16 DIAGNOSIS — R7881 Bacteremia: Secondary | ICD-10-CM | POA: Diagnosis not present

## 2020-11-16 DIAGNOSIS — B9562 Methicillin resistant Staphylococcus aureus infection as the cause of diseases classified elsewhere: Secondary | ICD-10-CM | POA: Diagnosis not present

## 2020-11-16 DIAGNOSIS — Z5181 Encounter for therapeutic drug level monitoring: Secondary | ICD-10-CM

## 2020-11-16 DIAGNOSIS — S81802A Unspecified open wound, left lower leg, initial encounter: Secondary | ICD-10-CM | POA: Diagnosis not present

## 2020-11-16 DIAGNOSIS — M79604 Pain in right leg: Secondary | ICD-10-CM | POA: Diagnosis not present

## 2020-11-16 DIAGNOSIS — G629 Polyneuropathy, unspecified: Secondary | ICD-10-CM | POA: Diagnosis not present

## 2020-11-16 DIAGNOSIS — S81801A Unspecified open wound, right lower leg, initial encounter: Secondary | ICD-10-CM | POA: Diagnosis not present

## 2020-11-16 NOTE — Progress Notes (Addendum)
[facility]                                                             [place], [place], [place], [address]                                                                  Phn. [phone]; Fax: [phone]                                                                             Date: 11/16/20  Reason for Follow up: HFU for MRSA bacteremia  Assessment  Problem List Items Addressed This Visit      Other   MRSA bacteremia - Primary    Other Visit Diagnoses    Medication monitoring encounter         Complicated MRSA Bacteremia ( Nosocomial) Lower Extremity wounds Medication monitoring  Plan Continue IV daptomycin until 11/26/20. Stop date 11/27/20 PICC line to be removed in 11/27/20 Have requested labs from [facility] for medication monitoring inclduing CPK.  Fu in 2 weeks   All questions and concerns were discussed and addressed. Patient verbalized understanding of the plan. ____________________________________________________________________________________________________________________  HPI: 80 Year old female with PMH of CKD stage IIIb, congenital solitary kidney, hypertension, hyperlipidemia PVD, PMR hx who was on prednisone previously but stopped who was admitted recently ( 4/16-4/28) and found to have Hospital acquired MRSA bacteremia is here for HFU. Blood cultures on admission 4/16 2/2 sets no growth, repeat blood cx 4/20 1/2 sets MRSA which cleared on 4/22. TTE and TEE negative for vegetations. Seen by ID and planned for 4 weeks of IV daptomycin. End date 11/27/20. Patient was discharged on 11/04/20  Today's Visit  Getting IV Daptomycin via PICC line. Nauseous at times. But denies vomiting, abdominal pain and diarrhea. Denies any issues with the PICC line. Feels good since being on antibiotics. No GU symptoms. She is seen by [place] at her facility every day for her Lower extremity  wounds and her legs were bandaged this morning by them. She says he leg wounds have been doing well with no concerns of infection per [facility] at her facility. She is regularly being seen by [facility] at her facility.   ROS: 12 point ROS done with pertinent positives and negatives listed above   Past Medical History:  Diagnosis Date  . Anxiety   . Arthritis    RA  . Carpal tunnel syndrome, bilateral   . Chronic kidney disease    CKD stage III, absence of left kidney  . CKD (chronic kidney disease) stage 3, GFR 30-59 ml/min (HCC) 09/06/2019  . Congenital absence of one kidney    Pt has right kidney only  . Depression   . Dyslipidemia 09/06/2019  .  Essential hypertension 09/06/2019  . GERD (gastroesophageal reflux disease)   . Headache   . Heart murmur   . Hypercholesteremia   . Hypertension   . Peripheral vascular disease ([facility])    a. s/p R external iliac stent '06 with angioplasty '13 (Dr. Benjamine Sprague) b. 01/2016: s/p PTA/stenting in L common iliac artery (Dr. Gwenlyn Found)  . Polymyalgia rheumatica ([facility])   . Psoriasis   . Sleep apnea   . Tremor 09/06/2019   Past Surgical History:  Procedure Laterality Date  . ABDOMINAL HYSTERECTOMY  1963   Partial,  Due to bleeding after delivery  . ANTERIOR CERVICAL DECOMP/DISCECTOMY FUSION  04/19/2012   Procedure: ANTERIOR CERVICAL DECOMPRESSION/DISCECTOMY FUSION 2 LEVELS;  Surgeon: Winfield Cunas, MD;  Location: [facility];  Service: Neurosurgery;  Laterality: N/A;  Cervical four-five,Cervical five-six anterior cervical decompression with fusion plating and bonegraft possible posterior cervical decompression  . APPENDECTOMY    . CARPAL TUNNEL RELEASE  1990  . CERVICAL FUSION  04/19/2012  . EYE SURGERY    . ILIAC ARTERY STENT    . PERIPHERAL VASCULAR CATHETERIZATION N/A 01/31/2016   Procedure: Abdominal Aortogram;  Surgeon: [name], MD;  Location: [facility];  Service: Cardiovascular;  Laterality: N/A;  . PERIPHERAL VASCULAR CATHETERIZATION  Bilateral 01/31/2016   Procedure: Lower Extremity Angiography;  Surgeon: [name], MD;  Location: [facility];  Service: Cardiovascular;  Laterality: Bilateral;  . PERIPHERAL VASCULAR CATHETERIZATION Left 01/31/2016   Procedure: Peripheral Vascular Intervention;  Surgeon: [name], MD;  Location: [facility];  Service: Cardiovascular;  Laterality: Left;  ILIAC  . TEE WITHOUT CARDIOVERSION N/A 11/01/2020   Procedure: TRANSESOPHAGEAL ECHOCARDIOGRAM (TEE);  Surgeon: Sueanne Margarita, MD;  Location: [facility];  Service: Cardiovascular;  Laterality: N/A;  . TONSILLECTOMY     Current Outpatient Medications on File Prior to Visit  Medication Sig Dispense Refill  . acetaminophen (TYLENOL) 500 MG tablet Take 500 mg by mouth every 8 (eight) hours as needed for moderate pain.    [name] albuterol (VENTOLIN HFA) 108 (90 Base) MCG/ACT inhaler Inhale 2 puffs into the lungs every 6 (six) hours as needed for cough, wheezing or shortness of breath.    [name] amLODipine (NORVASC) 2.5 MG tablet Take 1 tablet (2.5 mg total) by mouth daily.    . Ascorbic Acid (VITAMIN C PO) Take 1 tablet by mouth daily.    [name] aspirin 81 MG EC tablet Take 1 tablet (81 mg total) by mouth daily. 30 tablet 12  . busPIRone (BUSPAR) 15 MG tablet Take 1 tablet (15 mg total) by mouth 3 (three) times daily. 90 tablet 2  . cholecalciferol (VITAMIN D) 1000 units tablet Take 1,000 Units by mouth daily.    . clopidogrel (PLAVIX) 75 MG tablet Take 1 tablet (75 mg total) by mouth every morning. 90 tablet 3  . Cyanocobalamin (VITAMIN B 12 PO) Take 1 tablet by mouth daily.    . daptomycin (CUBICIN) IVPB Inject 500 mg into the vein daily for 25 days. Indication:  MRSA Bacteremia First Dose: Yes Last Day of Therapy:  11/26/20 Labs - Once weekly:  CBC/D, BMP, and CPK Labs - Every other week:  ESR and CRP Method of administration: IV Push Method of administration may be changed at the discretion of home infusion pharmacist based upon  assessment of the patient and/or caregiver's ability to self-administer the medication ordered. 25 Units 0  . divalproex (DEPAKOTE) 125 MG DR tablet Take 125 mg by  mouth 2 (two) times daily.    . Dorzolamide HCl-Timolol Mal PF 2-0.5 % SOLN Place 1 drop into both eyes 2 (two) times daily.    . ferrous sulfate 325 (65 FE) MG tablet Take 325 mg by mouth daily.    . furosemide (LASIX) 20 MG tablet Take 20 mg by mouth every other day as needed.    . gabapentin (NEURONTIN) 100 MG capsule Take 100 mg by mouth 3 (three) times daily.    . Latanoprostene Bunod 0.024 % SOLN Place 1 drop into both eyes at bedtime.    . mirtazapine (REMERON) 15 MG tablet Take 1 tablet (15 mg total) by mouth at bedtime. 90 tablet 1  . pantoprazole (PROTONIX) 40 MG tablet Take 40 mg by mouth daily.    . tamsulosin (FLOMAX) 0.4 MG CAPS capsule Take 1 capsule (0.4 mg total) by mouth daily after supper. 30 capsule   . traMADol (ULTRAM) 50 MG tablet Take 1 tablet (50 mg total) by mouth 3 (three) times daily as needed for up to 4 doses for moderate pain. 4 tablet 0   No current facility-administered medications on file prior to visit.   Allergies  Allergen Reactions  . Statins   . Gabapentin Itching    Was deleted since patient was taking, but now experiencing itching again.   . Statins Swelling, Rash and Other (See Comments)    Swelling involving tongue; also causes muscle pain   Social History   Socioeconomic History  . Marital status: Widowed    Spouse name: Not on file  . Number of children: Not on file  . Years of education: Not on file  . Highest education level: Not on file  Occupational History  . Not on file  Tobacco Use  . Smoking status: Former Smoker    Packs/day: 1.00    Years: 40.00    Pack years: 40.00    Types: Cigarettes    Quit date: 07/24/2010    Years since quitting: 10.3  . Smokeless tobacco: Never Used  . Tobacco comment: STARTED BACK JULY 2013 AND RECENTLY QUIT 03-10-2012  Vaping Use  .  Vaping Use: Never used  Substance and Sexual Activity  . Alcohol use: Never    Comment: QUIT IN 1999  . Drug use: Never  . Sexual activity: Not Currently    Comment: 1st intercourse 80 yo-Fewer than 5 partners  Other Topics Concern  . Not on file  Social History Narrative   ** Merged History Encounter **       Social Determinants of Health   Financial Resource Strain: Not on file  Food Insecurity: Not on file  Transportation Needs: Not on file  Physical Activity: Not on file  Stress: Not on file  Social Connections: Not on file  Intimate Partner Violence: Not on file    Vitals BP 125/79   Pulse (!) 122   Temp 98.9 F (37.2 C) (Other (Comment))   Resp 16   Ht 5' (1.524 m)   Wt 140 lb (63.5 kg)   SpO2 91%   BMI 27.34 kg/m   Examination  General - not in acute distress, comfortably sitting in wheelchair with mask on HEENT - PEERLA, Pale conjunctiva Chest - b/l clear air entry, no additional sounds CVS- Normal s1s2, RRR Abdomen - Soft, Non tender , non distended Ext- Bilateral extremities wrapped in a bandage  Neuro: grossly non focal  Psych : calm and cooperative  Recent labs CBC Latest Ref Rng & Units  11/05/2020 11/04/2020 11/03/2020  WBC 4.0 - 10.5 K/uL 5.4 5.9 5.8  Hemoglobin 12.0 - 15.0 g/dL 9.5(L) 9.3(L) 9.2(L)  Hematocrit 36.0 - 46.0 % 27.9(L) 28.2(L) 28.3(L)  Platelets 150 - 400 K/uL 297 325 303   CMP Latest Ref Rng & Units 11/02/2020 11/01/2020 10/31/2020  Glucose 70 - 99 mg/dL 95 103(H) 106(H)  BUN 8 - 23 mg/dL 20 28(H) 32(H)  Creatinine 0.44 - 1.00 mg/dL 0.92 1.25(H) 1.18(H)  Sodium 135 - 145 mmol/L 138 135 133(L)  Potassium 3.5 - 5.1 mmol/L 3.8 3.9 3.4(L)  Chloride 98 - 111 mmol/L 102 97(L) 92(L)  CO2 22 - 32 mmol/L _0 Calcium 8.9 - 10.3 mg/dL 9.0 8.8(L) 8.9  Total Protein 6.5 - 8.1 g/dL - - -  Total Bilirubin 0.3 - 1.2 mg/dL - - -  Alkaline Phos 38 - 126 U/L - - -  AST 15 - 41 U/L - - -  ALT 0 - 44 U/L - - -     Pertinent  Microbiology Results for orders placed or performed during the hospital encounter of 10/23/20  Blood culture (routine x 2)     Status: None   Collection Time: 10/23/20  2:10 PM   Specimen: BLOOD  Result Value Ref Range Status   Specimen Description BLOOD SITE NOT SPECIFIED  Final   Special Requests   Final    BOTTLES DRAWN AEROBIC AND ANAEROBIC Blood Culture adequate volume   Culture   Final    NO GROWTH 5 DAYS Performed at [facility], [place] [address] [place], [place] [address]    Report Status 10/28/2020 FINAL  Final  Blood culture (routine x 2)     Status: None   Collection Time: 10/23/20  2:26 PM   Specimen: BLOOD  Result Value Ref Range Status   Specimen Description BLOOD SITE NOT SPECIFIED  Final   Special Requests   Final    BOTTLES DRAWN AEROBIC AND ANAEROBIC Blood Culture adequate volume   Culture   Final    NO GROWTH 5 DAYS Performed at [facility], [place] [address] [place], [place] [address]    Report Status 10/28/2020 FINAL  Final  Resp Panel by RT-PCR (Flu A&B, Covid) Nasopharyngeal Swab     Status: None   Collection Time: 10/27/20 10:54 AM   Specimen: Nasopharyngeal Swab; Nasopharyngeal(NP) swabs in vial transport medium  Result Value Ref Range Status   SARS Coronavirus 2 by RT PCR NEGATIVE NEGATIVE Final    Comment: (NOTE) SARS-CoV-2 target nucleic acids are NOT DETECTED.  The SARS-CoV-2 RNA is generally detectable in upper respiratory specimens during the acute phase of infection. The lowest concentration of SARS-CoV-2 viral copies this assay can detect is 138 copies/mL. A negative result does not preclude SARS-Cov-2 infection and should not be used as the sole basis for treatment or other patient management decisions. A negative result may occur with  improper specimen collection/handling, submission of specimen other than nasopharyngeal swab, presence of viral mutation(s) within the areas targeted by this assay, and inadequate number of  viral copies(<138 copies/mL). A negative result must be combined with clinical observations, patient history, and epidemiological information. The expected result is Negative.  Fact Sheet for Patients:  EntrepreneurPulse.com.au  Fact Sheet for Healthcare Providers:  IncredibleEmployment.be  This test is no t yet approved or cleared by the [place] FDA and  has been authorized for detection and/or diagnosis of SARS-CoV-2 by FDA under an Emergency Use Authorization (EUA). This  EUA will remain  in effect (meaning this test can be used) for the duration of the COVID-19 declaration under Section 564(b)(1) of the Act, 21 U.S.C.section 360bbb-3(b)(1), unless the authorization is terminated  or revoked sooner.       Influenza A by PCR NEGATIVE NEGATIVE Final   Influenza B by PCR NEGATIVE NEGATIVE Final    Comment: (NOTE) The Xpert Xpress SARS-CoV-2/FLU/RSV plus assay is intended as an aid in the diagnosis of influenza from Nasopharyngeal swab specimens and should not be used as a sole basis for treatment. Nasal washings and aspirates are unacceptable for Xpert Xpress SARS-CoV-2/FLU/RSV testing.  Fact Sheet for Patients: EntrepreneurPulse.com.au  Fact Sheet for Healthcare Providers: IncredibleEmployment.be  This test is not yet approved or cleared by the [place] FDA and has been authorized for detection and/or diagnosis of SARS-CoV-2 by FDA under an Emergency Use Authorization (EUA). This EUA will remain in effect (meaning this test can be used) for the duration of the COVID-19 declaration under Section 564(b)(1) of the Act, 21 U.S.C. section 360bbb-3(b)(1), unless the authorization is terminated or revoked.  Performed at [facility], [place] [address] [place], [place] [address]   Culture, blood (routine x 2)     Status: None   Collection Time: 10/27/20  2:00 PM   Specimen: BLOOD  Result  Value Ref Range Status   Specimen Description BLOOD LEFT ANTECUBITAL  Final   Special Requests   Final    BOTTLES DRAWN AEROBIC AND ANAEROBIC Blood Culture adequate volume   Culture   Final    NO GROWTH 5 DAYS Performed at [facility], [place] [address], [place], [place] [address]    Report Status 11/01/2020 FINAL  Final  Culture, blood (routine x 2)     Status: Abnormal   Collection Time: 10/27/20  2:05 PM   Specimen: BLOOD  Result Value Ref Range Status   Specimen Description BLOOD RIGHT ANTECUBITAL  Final   Special Requests   Final    BOTTLES DRAWN AEROBIC AND ANAEROBIC Blood Culture adequate volume   Culture  Setup Time   Final    GRAM POSITIVE COCCI IN CLUSTERS AEROBIC BOTTLE ONLY CRITICAL RESULT CALLED TO, READ BACK BY AND VERIFIED WITH: PHARMD JEREMY FRENN AT 6761 ON 10/28/20 BY KJ Performed at [facility], [place] [address], [place],  [address]    Culture METHICILLIN RESISTANT STAPHYLOCOCCUS AUREUS (A)  Final   Report Status 10/30/2020 FINAL  Final   Organism ID, Bacteria METHICILLIN RESISTANT STAPHYLOCOCCUS AUREUS  Final      Susceptibility   Methicillin resistant staphylococcus aureus - MIC*    CIPROFLOXACIN >=8 RESISTANT Resistant     ERYTHROMYCIN >=8 RESISTANT Resistant     GENTAMICIN <=0.5 SENSITIVE Sensitive     OXACILLIN >=4 RESISTANT Resistant     TETRACYCLINE <=1 SENSITIVE Sensitive     VANCOMYCIN <=0.5 SENSITIVE Sensitive     TRIMETH/SULFA <=10 SENSITIVE Sensitive     CLINDAMYCIN <=0.25 SENSITIVE Sensitive     RIFAMPIN <=0.5 SENSITIVE Sensitive     Inducible Clindamycin NEGATIVE Sensitive     * METHICILLIN RESISTANT STAPHYLOCOCCUS AUREUS  Blood Culture ID Panel (Reflexed)     Status: Abnormal   Collection Time: 10/27/20  2:05 PM  Result Value Ref Range Status   Enterococcus faecalis NOT DETECTED NOT DETECTED Final   Enterococcus Faecium NOT DETECTED NOT DETECTED Final   Listeria monocytogenes NOT DETECTED NOT DETECTED Final   Staphylococcus  species DETECTED (A) NOT DETECTED  Final    Comment: CRITICAL RESULT CALLED TO, READ BACK BY AND VERIFIED WITH: [place] ON 10/28/20    Staphylococcus aureus (BCID) DETECTED (A) NOT DETECTED Final    Comment: Methicillin (oxacillin)-resistant Staphylococcus aureus (MRSA). MRSA is predictably resistant to beta-lactam antibiotics (except ceftaroline). Preferred therapy is vancomycin unless clinically contraindicated. Patient requires contact precautions if  hospitalized. CRITICAL RESULT CALLED TO, READ BACK BY AND VERIFIED WITH: [place] AT 0919 ON 10/28/20 BY KJ    Staphylococcus epidermidis NOT DETECTED NOT DETECTED Final   Staphylococcus lugdunensis NOT DETECTED NOT DETECTED Final   Streptococcus species NOT DETECTED NOT DETECTED Final   Streptococcus agalactiae NOT DETECTED NOT DETECTED Final   Streptococcus pneumoniae NOT DETECTED NOT DETECTED Final   Streptococcus pyogenes NOT DETECTED NOT DETECTED Final   A.calcoaceticus-baumannii NOT DETECTED NOT DETECTED Final   Bacteroides fragilis NOT DETECTED NOT DETECTED Final   Enterobacterales NOT DETECTED NOT DETECTED Final   Enterobacter cloacae complex NOT DETECTED NOT DETECTED Final   Escherichia coli NOT DETECTED NOT DETECTED Final   Klebsiella aerogenes NOT DETECTED NOT DETECTED Final   Klebsiella oxytoca NOT DETECTED NOT DETECTED Final   Klebsiella pneumoniae NOT DETECTED NOT DETECTED Final   Proteus species NOT DETECTED NOT DETECTED Final   Salmonella species NOT DETECTED NOT DETECTED Final   Serratia marcescens NOT DETECTED NOT DETECTED Final   Haemophilus influenzae NOT DETECTED NOT DETECTED Final   Neisseria meningitidis NOT DETECTED NOT DETECTED Final   Pseudomonas aeruginosa NOT DETECTED NOT DETECTED Final   Stenotrophomonas maltophilia NOT DETECTED NOT DETECTED Final   Candida albicans NOT DETECTED NOT DETECTED Final   Candida auris NOT DETECTED NOT DETECTED Final   Candida glabrata NOT  DETECTED NOT DETECTED Final   Candida krusei NOT DETECTED NOT DETECTED Final   Candida parapsilosis NOT DETECTED NOT DETECTED Final   Candida tropicalis NOT DETECTED NOT DETECTED Final   Cryptococcus neoformans/gattii NOT DETECTED NOT DETECTED Final   Meth resistant mecA/C and MREJ DETECTED (A) NOT DETECTED Final    Comment: CRITICAL RESULT CALLED TO, READ BACK BY AND VERIFIED WITH: PHARMD JEREMY FRENN AT 7591 ON 10/28/20 BY KJ Performed at [facility], [address], [place], [place] [address]   Respiratory (~20 pathogens) panel by PCR     Status: None   Collection Time: 10/27/20  5:00 PM   Specimen: Nasopharyngeal Swab; Respiratory  Result Value Ref Range Status   Adenovirus NOT DETECTED NOT DETECTED Final   Coronavirus 229E NOT DETECTED NOT DETECTED Final    Comment: (NOTE) The Coronavirus on the Respiratory Panel, DOES NOT test for the novel  Coronavirus (2019 nCoV)    Coronavirus HKU1 NOT DETECTED NOT DETECTED Final   Coronavirus NL63 NOT DETECTED NOT DETECTED Final   Coronavirus OC43 NOT DETECTED NOT DETECTED Final   Metapneumovirus NOT DETECTED NOT DETECTED Final   Rhinovirus / Enterovirus NOT DETECTED NOT DETECTED Final   Influenza A NOT DETECTED NOT DETECTED Final   Influenza B NOT DETECTED NOT DETECTED Final   Parainfluenza Virus 1 NOT DETECTED NOT DETECTED Final   Parainfluenza Virus 2 NOT DETECTED NOT DETECTED Final   Parainfluenza Virus 3 NOT DETECTED NOT DETECTED Final   Parainfluenza Virus 4 NOT DETECTED NOT DETECTED Final   Respiratory Syncytial Virus NOT DETECTED NOT DETECTED Final   Bordetella pertussis NOT DETECTED NOT DETECTED Final   Bordetella Parapertussis NOT DETECTED NOT DETECTED Final   Chlamydophila pneumoniae NOT DETECTED NOT DETECTED Final  Mycoplasma pneumoniae NOT DETECTED NOT DETECTED Final    Comment: Performed at [facility], [place] [address] [place], [place] [address]  Culture, blood (Routine X 2) w Reflex to ID Panel     Status:  None   Collection Time: 10/29/20  2:56 PM   Specimen: BLOOD  Result Value Ref Range Status   Specimen Description BLOOD RIGHT ANTECUBITAL  Final   Special Requests   Final    BOTTLES DRAWN AEROBIC AND ANAEROBIC Blood Culture adequate volume   Culture   Final    NO GROWTH 5 DAYS Performed at [facility], [place] [address], [place], [place] [address]    Report Status 11/03/2020 FINAL  Final  Culture, blood (Routine X 2) w Reflex to ID Panel     Status: None   Collection Time: 10/29/20  2:57 PM   Specimen: BLOOD  Result Value Ref Range Status   Specimen Description BLOOD LEFT ANTECUBITAL  Final   Special Requests   Final    BOTTLES DRAWN AEROBIC AND ANAEROBIC Blood Culture adequate volume   Culture   Final    NO GROWTH 5 DAYS Performed at [facility], [place] [address], [place], [place] [address]    Report Status 11/03/2020 FINAL  Final  Culture, blood (Routine X 2) w Reflex to ID Panel     Status: None   Collection Time: 10/30/20 10:01 AM   Specimen: BLOOD RIGHT HAND  Result Value Ref Range Status   Specimen Description BLOOD RIGHT HAND  Final   Special Requests   Final    BOTTLES DRAWN AEROBIC ONLY Blood Culture adequate volume   Culture   Final    NO GROWTH 5 DAYS Performed at [facility], [place] [address] [place], [place] [address]    Report Status 11/04/2020 FINAL  Final  Culture, blood (Routine X 2) w Reflex to ID Panel     Status: None   Collection Time: 10/30/20 10:12 AM   Specimen: BLOOD  Result Value Ref Range Status   Specimen Description BLOOD LEFT ANTECUBITAL  Final   Special Requests   Final    BOTTLES DRAWN AEROBIC ONLY Blood Culture results may not be optimal due to an inadequate volume of blood received in culture bottles   Culture   Final    NO GROWTH 5 DAYS Performed at [facility], [place] [address] [place], [place] [address]    Report Status 11/04/2020 FINAL  Final  SARS CORONAVIRUS 2 (TAT 6-24 HRS) Nasopharyngeal Nasopharyngeal  Swab     Status: None   Collection Time: 11/04/20  1:39 PM   Specimen: Nasopharyngeal Swab  Result Value Ref Range Status   SARS Coronavirus 2 NEGATIVE NEGATIVE Final    Comment: (NOTE) SARS-CoV-2 target nucleic acids are NOT DETECTED.  The SARS-CoV-2 RNA is generally detectable in upper and lower respiratory specimens during the acute phase of infection. Negative results do not preclude SARS-CoV-2 infection, do not rule out co-infections with other pathogens, and should not be used as the sole basis for treatment or other patient management decisions. Negative results must be combined with clinical observations, patient history, and epidemiological information. The expected result is Negative.  Fact Sheet for Patients: SugarRoll.be  Fact Sheet for Healthcare Providers: https://www.woods-mathews.com/  This test is not yet approved or cleared by the [place] FDA and  has been authorized for detection and/or diagnosis of SARS-CoV-2 by FDA under an Emergency Use Authorization (EUA). This  EUA will remain  in effect (meaning this test can be used) for the duration of the COVID-19 declaration under Se ction 564(b)(1) of the Act, 21 U.S.C. section 360bbb-3(b)(1), unless the authorization is terminated or revoked sooner.  Performed at [facility], [place] [address] [place], [place] [address]      Pertinent Imaging All pertinent labs/Imagings/notes reviewed. All pertinent plain films and CT images have been personally visualized and interpreted; radiology reports have been reviewed. Decision making incorporated into the Impression / Recommendations.  I have spent 60 minutes for this patient encounter including  review of prior medical records with greater than 50% of time in face to face counsel of the patient/discussing diagnostics and plan of care.   Electronically signed by:  Rosiland Oz, MD Infectious Disease  Physician [facility] [address] [place], [place] [address] Phone: [phone]  Fax: [phone]

## 2020-11-17 DIAGNOSIS — I87319 Chronic venous hypertension (idiopathic) with ulcer of unspecified lower extremity: Secondary | ICD-10-CM | POA: Diagnosis not present

## 2020-11-17 DIAGNOSIS — I87312 Chronic venous hypertension (idiopathic) with ulcer of left lower extremity: Secondary | ICD-10-CM | POA: Diagnosis not present

## 2020-11-17 DIAGNOSIS — I739 Peripheral vascular disease, unspecified: Secondary | ICD-10-CM | POA: Diagnosis not present

## 2020-11-17 DIAGNOSIS — I87311 Chronic venous hypertension (idiopathic) with ulcer of right lower extremity: Secondary | ICD-10-CM | POA: Diagnosis not present

## 2020-11-18 DIAGNOSIS — M6281 Muscle weakness (generalized): Secondary | ICD-10-CM | POA: Diagnosis not present

## 2020-11-18 DIAGNOSIS — G629 Polyneuropathy, unspecified: Secondary | ICD-10-CM | POA: Diagnosis not present

## 2020-11-18 DIAGNOSIS — M79604 Pain in right leg: Secondary | ICD-10-CM | POA: Diagnosis not present

## 2020-11-18 DIAGNOSIS — E43 Unspecified severe protein-calorie malnutrition: Secondary | ICD-10-CM | POA: Diagnosis not present

## 2020-11-21 ENCOUNTER — Inpatient Hospital Stay (HOSPITAL_COMMUNITY): Payer: PPO

## 2020-11-21 ENCOUNTER — Emergency Department (HOSPITAL_COMMUNITY): Payer: PPO

## 2020-11-21 ENCOUNTER — Inpatient Hospital Stay (HOSPITAL_COMMUNITY)
Admission: EM | Admit: 2020-11-21 | Discharge: 2020-11-29 | DRG: 871 | Disposition: A | Discharge status: Skilled Nursing Facility | Payer: PPO | Source: Skilled Nursing Facility | Attending: Internal Medicine | Admitting: Internal Medicine

## 2020-11-21 ENCOUNTER — Other Ambulatory Visit: Payer: Self-pay

## 2020-11-21 DIAGNOSIS — Z8249 Family history of ischemic heart disease and other diseases of the circulatory system: Secondary | ICD-10-CM

## 2020-11-21 DIAGNOSIS — D539 Nutritional anemia, unspecified: Secondary | ICD-10-CM | POA: Diagnosis not present

## 2020-11-21 DIAGNOSIS — E7800 Pure hypercholesterolemia, unspecified: Secondary | ICD-10-CM | POA: Diagnosis not present

## 2020-11-21 DIAGNOSIS — R52 Pain, unspecified: Secondary | ICD-10-CM

## 2020-11-21 DIAGNOSIS — R6520 Severe sepsis without septic shock: Secondary | ICD-10-CM | POA: Diagnosis not present

## 2020-11-21 DIAGNOSIS — I340 Nonrheumatic mitral (valve) insufficiency: Secondary | ICD-10-CM | POA: Diagnosis not present

## 2020-11-21 DIAGNOSIS — N1832 Chronic kidney disease, stage 3b: Secondary | ICD-10-CM

## 2020-11-21 DIAGNOSIS — R7881 Bacteremia: Secondary | ICD-10-CM | POA: Diagnosis not present

## 2020-11-21 DIAGNOSIS — J189 Pneumonia, unspecified organism: Secondary | ICD-10-CM | POA: Diagnosis not present

## 2020-11-21 DIAGNOSIS — B9562 Methicillin resistant Staphylococcus aureus infection as the cause of diseases classified elsewhere: Secondary | ICD-10-CM

## 2020-11-21 DIAGNOSIS — J15212 Pneumonia due to Methicillin resistant Staphylococcus aureus: Secondary | ICD-10-CM | POA: Diagnosis not present

## 2020-11-21 DIAGNOSIS — L409 Psoriasis, unspecified: Secondary | ICD-10-CM | POA: Diagnosis not present

## 2020-11-21 DIAGNOSIS — M79602 Pain in left arm: Secondary | ICD-10-CM | POA: Diagnosis not present

## 2020-11-21 DIAGNOSIS — I129 Hypertensive chronic kidney disease with stage 1 through stage 4 chronic kidney disease, or unspecified chronic kidney disease: Secondary | ICD-10-CM | POA: Diagnosis present

## 2020-11-21 DIAGNOSIS — I872 Venous insufficiency (chronic) (peripheral): Secondary | ICD-10-CM | POA: Diagnosis not present

## 2020-11-21 DIAGNOSIS — Y95 Nosocomial condition: Secondary | ICD-10-CM | POA: Diagnosis present

## 2020-11-21 DIAGNOSIS — N179 Acute kidney failure, unspecified: Secondary | ICD-10-CM | POA: Diagnosis not present

## 2020-11-21 DIAGNOSIS — M069 Rheumatoid arthritis, unspecified: Secondary | ICD-10-CM | POA: Diagnosis not present

## 2020-11-21 DIAGNOSIS — J9601 Acute respiratory failure with hypoxia: Secondary | ICD-10-CM | POA: Diagnosis present

## 2020-11-21 DIAGNOSIS — F419 Anxiety disorder, unspecified: Secondary | ICD-10-CM | POA: Diagnosis not present

## 2020-11-21 DIAGNOSIS — Z8614 Personal history of Methicillin resistant Staphylococcus aureus infection: Secondary | ICD-10-CM

## 2020-11-21 DIAGNOSIS — L089 Local infection of the skin and subcutaneous tissue, unspecified: Secondary | ICD-10-CM | POA: Diagnosis not present

## 2020-11-21 DIAGNOSIS — R41841 Cognitive communication deficit: Secondary | ICD-10-CM | POA: Diagnosis not present

## 2020-11-21 DIAGNOSIS — K5900 Constipation, unspecified: Secondary | ICD-10-CM | POA: Diagnosis not present

## 2020-11-21 DIAGNOSIS — M545 Low back pain, unspecified: Secondary | ICD-10-CM | POA: Diagnosis not present

## 2020-11-21 DIAGNOSIS — K219 Gastro-esophageal reflux disease without esophagitis: Secondary | ICD-10-CM | POA: Diagnosis present

## 2020-11-21 DIAGNOSIS — R0602 Shortness of breath: Secondary | ICD-10-CM | POA: Diagnosis not present

## 2020-11-21 DIAGNOSIS — D696 Thrombocytopenia, unspecified: Secondary | ICD-10-CM | POA: Diagnosis not present

## 2020-11-21 DIAGNOSIS — F32A Depression, unspecified: Secondary | ICD-10-CM | POA: Diagnosis present

## 2020-11-21 DIAGNOSIS — H401133 Primary open-angle glaucoma, bilateral, severe stage: Secondary | ICD-10-CM | POA: Diagnosis not present

## 2020-11-21 DIAGNOSIS — F332 Major depressive disorder, recurrent severe without psychotic features: Secondary | ICD-10-CM | POA: Diagnosis not present

## 2020-11-21 DIAGNOSIS — I2510 Atherosclerotic heart disease of native coronary artery without angina pectoris: Secondary | ICD-10-CM | POA: Diagnosis present

## 2020-11-21 DIAGNOSIS — G629 Polyneuropathy, unspecified: Secondary | ICD-10-CM | POA: Diagnosis not present

## 2020-11-21 DIAGNOSIS — Z7982 Long term (current) use of aspirin: Secondary | ICD-10-CM

## 2020-11-21 DIAGNOSIS — M353 Polymyalgia rheumatica: Secondary | ICD-10-CM | POA: Diagnosis not present

## 2020-11-21 DIAGNOSIS — Z20822 Contact with and (suspected) exposure to covid-19: Secondary | ICD-10-CM | POA: Diagnosis present

## 2020-11-21 DIAGNOSIS — A419 Sepsis, unspecified organism: Principal | ICD-10-CM | POA: Diagnosis present

## 2020-11-21 DIAGNOSIS — F40240 Claustrophobia: Secondary | ICD-10-CM | POA: Diagnosis not present

## 2020-11-21 DIAGNOSIS — F411 Generalized anxiety disorder: Secondary | ICD-10-CM | POA: Diagnosis not present

## 2020-11-21 DIAGNOSIS — A4102 Sepsis due to Methicillin resistant Staphylococcus aureus: Principal | ICD-10-CM | POA: Diagnosis present

## 2020-11-21 DIAGNOSIS — Q602 Renal agenesis, unspecified: Secondary | ICD-10-CM | POA: Diagnosis not present

## 2020-11-21 DIAGNOSIS — I081 Rheumatic disorders of both mitral and tricuspid valves: Secondary | ICD-10-CM | POA: Diagnosis not present

## 2020-11-21 DIAGNOSIS — Q600 Renal agenesis, unilateral: Secondary | ICD-10-CM

## 2020-11-21 DIAGNOSIS — N183 Chronic kidney disease, stage 3 unspecified: Secondary | ICD-10-CM | POA: Diagnosis present

## 2020-11-21 DIAGNOSIS — Z90710 Acquired absence of both cervix and uterus: Secondary | ICD-10-CM

## 2020-11-21 DIAGNOSIS — M19012 Primary osteoarthritis, left shoulder: Secondary | ICD-10-CM | POA: Diagnosis not present

## 2020-11-21 DIAGNOSIS — Z66 Do not resuscitate: Secondary | ICD-10-CM | POA: Diagnosis present

## 2020-11-21 DIAGNOSIS — Z87891 Personal history of nicotine dependence: Secondary | ICD-10-CM

## 2020-11-21 DIAGNOSIS — Z79899 Other long term (current) drug therapy: Secondary | ICD-10-CM

## 2020-11-21 DIAGNOSIS — M057A Rheumatoid arthritis with rheumatoid factor of other specified site without organ or systems involvement: Secondary | ICD-10-CM | POA: Diagnosis not present

## 2020-11-21 DIAGNOSIS — R519 Headache, unspecified: Secondary | ICD-10-CM | POA: Diagnosis not present

## 2020-11-21 DIAGNOSIS — I76 Septic arterial embolism: Secondary | ICD-10-CM | POA: Diagnosis not present

## 2020-11-21 DIAGNOSIS — E782 Mixed hyperlipidemia: Secondary | ICD-10-CM | POA: Diagnosis not present

## 2020-11-21 DIAGNOSIS — M6281 Muscle weakness (generalized): Secondary | ICD-10-CM | POA: Diagnosis not present

## 2020-11-21 DIAGNOSIS — Z888 Allergy status to other drugs, medicaments and biological substances status: Secondary | ICD-10-CM

## 2020-11-21 DIAGNOSIS — I330 Acute and subacute infective endocarditis: Secondary | ICD-10-CM | POA: Diagnosis not present

## 2020-11-21 DIAGNOSIS — J929 Pleural plaque without asbestos: Secondary | ICD-10-CM | POA: Diagnosis not present

## 2020-11-21 DIAGNOSIS — J9811 Atelectasis: Secondary | ICD-10-CM | POA: Diagnosis not present

## 2020-11-21 DIAGNOSIS — L97919 Non-pressure chronic ulcer of unspecified part of right lower leg with unspecified severity: Secondary | ICD-10-CM

## 2020-11-21 DIAGNOSIS — I2690 Septic pulmonary embolism without acute cor pulmonale: Secondary | ICD-10-CM | POA: Diagnosis not present

## 2020-11-21 DIAGNOSIS — R627 Adult failure to thrive: Secondary | ICD-10-CM | POA: Diagnosis not present

## 2020-11-21 DIAGNOSIS — R0902 Hypoxemia: Secondary | ICD-10-CM | POA: Diagnosis not present

## 2020-11-21 DIAGNOSIS — E871 Hypo-osmolality and hyponatremia: Secondary | ICD-10-CM | POA: Diagnosis not present

## 2020-11-21 DIAGNOSIS — I959 Hypotension, unspecified: Secondary | ICD-10-CM | POA: Diagnosis not present

## 2020-11-21 DIAGNOSIS — E8809 Other disorders of plasma-protein metabolism, not elsewhere classified: Secondary | ICD-10-CM | POA: Diagnosis present

## 2020-11-21 DIAGNOSIS — E785 Hyperlipidemia, unspecified: Secondary | ICD-10-CM | POA: Diagnosis present

## 2020-11-21 DIAGNOSIS — I10 Essential (primary) hypertension: Secondary | ICD-10-CM | POA: Diagnosis not present

## 2020-11-21 DIAGNOSIS — Z981 Arthrodesis status: Secondary | ICD-10-CM

## 2020-11-21 DIAGNOSIS — R338 Other retention of urine: Secondary | ICD-10-CM | POA: Diagnosis not present

## 2020-11-21 DIAGNOSIS — E669 Obesity, unspecified: Secondary | ICD-10-CM | POA: Diagnosis not present

## 2020-11-21 DIAGNOSIS — B9561 Methicillin susceptible Staphylococcus aureus infection as the cause of diseases classified elsewhere: Secondary | ICD-10-CM

## 2020-11-21 DIAGNOSIS — L97929 Non-pressure chronic ulcer of unspecified part of left lower leg with unspecified severity: Secondary | ICD-10-CM

## 2020-11-21 DIAGNOSIS — R000 Tachycardia, unspecified: Secondary | ICD-10-CM | POA: Diagnosis not present

## 2020-11-21 DIAGNOSIS — I739 Peripheral vascular disease, unspecified: Secondary | ICD-10-CM | POA: Diagnosis not present

## 2020-11-21 DIAGNOSIS — M7989 Other specified soft tissue disorders: Secondary | ICD-10-CM | POA: Diagnosis present

## 2020-11-21 DIAGNOSIS — Z7902 Long term (current) use of antithrombotics/antiplatelets: Secondary | ICD-10-CM

## 2020-11-21 DIAGNOSIS — G4733 Obstructive sleep apnea (adult) (pediatric): Secondary | ICD-10-CM | POA: Diagnosis present

## 2020-11-21 DIAGNOSIS — Z8616 Personal history of COVID-19: Secondary | ICD-10-CM | POA: Diagnosis not present

## 2020-11-21 DIAGNOSIS — I38 Endocarditis, valve unspecified: Secondary | ICD-10-CM | POA: Diagnosis not present

## 2020-11-21 HISTORY — DX: Pneumonia, unspecified organism: A41.9

## 2020-11-21 HISTORY — DX: Septic arterial embolism: I76

## 2020-11-21 LAB — COMPREHENSIVE METABOLIC PANEL
ALT: 6 U/L (ref 0–44)
AST: 11 U/L — ABNORMAL LOW (ref 15–41)
Albumin: 2.3 g/dL — ABNORMAL LOW (ref 3.5–5.0)
Alkaline Phosphatase: 66 U/L (ref 38–126)
Anion gap: 6 (ref 5–15)
BUN: 9 mg/dL (ref 8–23)
CO2: 30 mmol/L (ref 22–32)
Calcium: 8.7 mg/dL — ABNORMAL LOW (ref 8.9–10.3)
Chloride: 102 mmol/L (ref 98–111)
Creatinine, Ser: 0.88 mg/dL (ref 0.44–1.00)
GFR, Estimated: >60 mL/min (ref >60)
Glucose, Bld: 107 mg/dL — ABNORMAL HIGH (ref 70–99)
Potassium: 4.8 mmol/L (ref 3.5–5.1)
Sodium: 138 mmol/L (ref 135–145)
Total Bilirubin: 0.4 mg/dL (ref 0.3–1.2)
Total Protein: 5.9 g/dL — ABNORMAL LOW (ref 6.5–8.1)

## 2020-11-21 LAB — RESP PANEL BY RT-PCR (FLU A&B, COVID) ARPGX2
Influenza A by PCR: NEGATIVE
Influenza B by PCR: NEGATIVE
SARS Coronavirus 2 by RT PCR: NEGATIVE

## 2020-11-21 LAB — LACTIC ACID, PLASMA
Lactic Acid, Venous: 1 mmol/L (ref 0.5–1.9)
Lactic Acid, Venous: 0.9 mmol/L (ref 0.5–1.9)

## 2020-11-21 LAB — CBC WITH DIFFERENTIAL/PLATELET
Abs Immature Granulocytes: 0.07 10*3/uL (ref 0.00–0.07)
Basophils Absolute: 0 10*3/uL (ref 0.0–0.1)
Basophils Relative: 0 %
Eosinophils Absolute: 0.5 10*3/uL (ref 0.0–0.5)
Eosinophils Relative: 7 %
HCT: 30.1 % — ABNORMAL LOW (ref 36.0–46.0)
Hemoglobin: 9.3 g/dL — ABNORMAL LOW (ref 12.0–15.0)
Immature Granulocytes: 1 %
Lymphocytes Relative: 16 %
Lymphs Abs: 1.2 10*3/uL (ref 0.7–4.0)
MCH: 32.2 pg (ref 26.0–34.0)
MCHC: 30.9 g/dL (ref 30.0–36.0)
MCV: 104.2 fL — ABNORMAL HIGH (ref 80.0–100.0)
Monocytes Absolute: 0.9 10*3/uL (ref 0.1–1.0)
Monocytes Relative: 12 %
Neutro Abs: 4.7 10*3/uL (ref 1.7–7.7)
Neutrophils Relative %: 64 %
Platelets: 209 10*3/uL (ref 150–400)
RBC: 2.89 MIL/uL — ABNORMAL LOW (ref 3.87–5.11)
RDW: 15.3 % (ref 11.5–15.5)
WBC: 7.4 10*3/uL (ref 4.0–10.5)
nRBC: 0 % (ref 0.0–0.2)

## 2020-11-21 LAB — TROPONIN I (HIGH SENSITIVITY)
Troponin I (High Sensitivity): 7 ng/L (ref <18)
Troponin I (High Sensitivity): 9 ng/L (ref <18)

## 2020-11-21 LAB — URINALYSIS, ROUTINE W REFLEX MICROSCOPIC
Bilirubin Urine: NEGATIVE
Glucose, UA: NEGATIVE mg/dL
Hgb urine dipstick: NEGATIVE
Ketones, ur: NEGATIVE mg/dL
Leukocytes,Ua: NEGATIVE
Nitrite: NEGATIVE
Protein, ur: NEGATIVE mg/dL
Specific Gravity, Urine: 1.01 (ref 1.005–1.030)
pH: 6 (ref 5.0–8.0)

## 2020-11-21 LAB — APTT: aPTT: 37 seconds — ABNORMAL HIGH (ref 24–36)

## 2020-11-21 LAB — PROTIME-INR
INR: 1.1 (ref 0.8–1.2)
Prothrombin Time: 13.9 seconds (ref 11.4–15.2)

## 2020-11-21 LAB — PROCALCITONIN: Procalcitonin: <0.1 ng/mL

## 2020-11-21 MED ORDER — LACTATED RINGERS IV SOLN: INTRAVENOUS | Status: AC

## 2020-11-21 MED ORDER — SODIUM CHLORIDE 0.9 % IV SOLN
2.0000 g | Freq: Two times a day (BID) | INTRAVENOUS | Status: DC
  Administered 2020-11-22 – 2020-11-23 (×4): 2 g via INTRAVENOUS
  Filled 2020-11-21 (×3): qty 2

## 2020-11-21 MED ORDER — ONDANSETRON HCL 4 MG PO TABS
4.0000 mg | ORAL_TABLET | Freq: Four times a day (QID) | ORAL | Status: DC | PRN
  Administered 2020-11-23: 4 mg via ORAL
  Filled 2020-11-21: qty 1

## 2020-11-21 MED ORDER — VANCOMYCIN HCL 1250 MG/250ML IV SOLN
1250.0000 mg | Freq: Once | INTRAVENOUS | Status: AC
  Administered 2020-11-21: 1250 mg via INTRAVENOUS
  Filled 2020-11-21: qty 250

## 2020-11-21 MED ORDER — ONDANSETRON HCL 4 MG/2ML IJ SOLN: 4.0000 mg | Freq: Four times a day (QID) | INTRAMUSCULAR | Status: DC | PRN

## 2020-11-21 MED ORDER — METRONIDAZOLE 500 MG/100ML IV SOLN
500.0000 mg | Freq: Three times a day (TID) | INTRAVENOUS | Status: DC
  Administered 2020-11-21 – 2020-11-23 (×6): 500 mg via INTRAVENOUS
  Filled 2020-11-21 (×6): qty 100

## 2020-11-21 MED ORDER — CHLORHEXIDINE GLUCONATE CLOTH 2 % EX PADS
6.0000 | MEDICATED_PAD | Freq: Every day | CUTANEOUS | Status: DC
  Administered 2020-11-22 – 2020-11-29 (×8): 6 via TOPICAL

## 2020-11-21 MED ORDER — MORPHINE SULFATE (PF) 2 MG/ML IV SOLN
2.0000 mg | INTRAVENOUS | Status: DC | PRN
Start: 2020-11-21 — End: 2020-11-23

## 2020-11-21 MED ORDER — TIMOLOL MALEATE 0.5 % OP SOLN
1.0000 [drp] | Freq: Two times a day (BID) | OPHTHALMIC | Status: DC
  Administered 2020-11-22 – 2020-11-29 (×15): 1 [drp] via OPHTHALMIC
  Filled 2020-11-21: qty 5

## 2020-11-21 MED ORDER — LACTATED RINGERS IV BOLUS (SEPSIS)
1000.0000 mL | Freq: Once | INTRAVENOUS | Status: AC
  Administered 2020-11-21: 1000 mL via INTRAVENOUS

## 2020-11-21 MED ORDER — DORZOLAMIDE HCL-TIMOLOL MAL PF 2-0.5 % OP SOLN: 1.0000 [drp] | Freq: Two times a day (BID) | OPHTHALMIC | Status: DC

## 2020-11-21 MED ORDER — ASPIRIN 81 MG PO CHEW
81.0000 mg | CHEWABLE_TABLET | Freq: Every day | ORAL | Status: DC
  Administered 2020-11-22 – 2020-11-29 (×8): 81 mg via ORAL
  Filled 2020-11-21 (×8): qty 1

## 2020-11-21 MED ORDER — LATANOPROST 0.005 % OP SOLN
1.0000 [drp] | Freq: Every day | OPHTHALMIC | Status: DC
  Administered 2020-11-22 – 2020-11-28 (×7): 1 [drp] via OPHTHALMIC
  Filled 2020-11-21: qty 2.5

## 2020-11-21 MED ORDER — CLOPIDOGREL BISULFATE 75 MG PO TABS
75.0000 mg | ORAL_TABLET | Freq: Every morning | ORAL | Status: DC
  Administered 2020-11-22 – 2020-11-29 (×7): 75 mg via ORAL
  Filled 2020-11-21 (×7): qty 1

## 2020-11-21 MED ORDER — ENOXAPARIN SODIUM 40 MG/0.4ML IJ SOSY
40.0000 mg | PREFILLED_SYRINGE | INTRAMUSCULAR | Status: DC
  Administered 2020-11-21 – 2020-11-28 (×7): 40 mg via SUBCUTANEOUS
  Filled 2020-11-21 (×7): qty 0.4

## 2020-11-21 MED ORDER — HYDROCODONE-ACETAMINOPHEN 5-325 MG PO TABS
1.0000 | ORAL_TABLET | Freq: Four times a day (QID) | ORAL | Status: DC | PRN
  Administered 2020-11-22 – 2020-11-29 (×5): 1 via ORAL
  Filled 2020-11-21 (×5): qty 1

## 2020-11-21 MED ORDER — TRAMADOL HCL 50 MG PO TABS
50.0000 mg | ORAL_TABLET | Freq: Three times a day (TID) | ORAL | Status: DC | PRN
  Administered 2020-11-25 – 2020-11-29 (×4): 50 mg via ORAL
  Filled 2020-11-21 (×5): qty 1

## 2020-11-21 MED ORDER — ACETAMINOPHEN 650 MG RE SUPP: 650.0000 mg | Freq: Four times a day (QID) | RECTAL | Status: DC | PRN

## 2020-11-21 MED ORDER — ACETAMINOPHEN 500 MG PO TABS
1000.0000 mg | ORAL_TABLET | ORAL | Status: AC
  Administered 2020-11-21: 1000 mg via ORAL
  Filled 2020-11-21: qty 2

## 2020-11-21 MED ORDER — DORZOLAMIDE HCL 2 % OP SOLN
1.0000 [drp] | Freq: Two times a day (BID) | OPHTHALMIC | Status: DC
  Administered 2020-11-22 – 2020-11-29 (×15): 1 [drp] via OPHTHALMIC
  Filled 2020-11-21: qty 10

## 2020-11-21 MED ORDER — SODIUM CHLORIDE 0.9 % IV SOLN
2.0000 g | Freq: Once | INTRAVENOUS | Status: AC
  Administered 2020-11-21: 2 g via INTRAVENOUS
  Filled 2020-11-21: qty 2

## 2020-11-21 MED ORDER — VANCOMYCIN HCL 500 MG/100ML IV SOLN
500.0000 mg | INTRAVENOUS | Status: DC
  Administered 2020-11-22 – 2020-11-24 (×3): 500 mg via INTRAVENOUS
  Filled 2020-11-21 (×4): qty 100

## 2020-11-21 MED ORDER — METRONIDAZOLE 500 MG/100ML IV SOLN: 500.0000 mg | Freq: Three times a day (TID) | INTRAVENOUS | Status: DC

## 2020-11-21 MED ORDER — ACETAMINOPHEN 325 MG PO TABS
650.0000 mg | ORAL_TABLET | Freq: Four times a day (QID) | ORAL | Status: DC | PRN
Start: 2020-11-21 — End: 2020-11-30
  Administered 2020-11-21 – 2020-11-27 (×4): 650 mg via ORAL
  Filled 2020-11-21 (×4): qty 2

## 2020-11-21 MED ORDER — DIVALPROEX SODIUM 250 MG PO DR TAB
250.0000 mg | DELAYED_RELEASE_TABLET | Freq: Two times a day (BID) | ORAL | Status: DC
  Administered 2020-11-21 – 2020-11-29 (×16): 250 mg via ORAL
  Filled 2020-11-21 (×17): qty 1

## 2020-11-21 MED ORDER — METRONIDAZOLE 500 MG/100ML IV SOLN
500.0000 mg | Freq: Once | INTRAVENOUS | Status: AC
  Administered 2020-11-21: 500 mg via INTRAVENOUS
  Filled 2020-11-21: qty 100

## 2020-11-21 MED ORDER — DILTIAZEM HCL 25 MG/5ML IV SOLN
5.0000 mg | Freq: Once | INTRAVENOUS | Status: AC
  Administered 2020-11-21: 5 mg via INTRAVENOUS
  Filled 2020-11-21: qty 5

## 2020-11-21 MED ORDER — GABAPENTIN 100 MG PO CAPS
200.0000 mg | ORAL_CAPSULE | Freq: Three times a day (TID) | ORAL | Status: DC
  Administered 2020-11-21 – 2020-11-29 (×23): 200 mg via ORAL
  Filled 2020-11-21 (×24): qty 2

## 2020-11-21 MED ORDER — PANTOPRAZOLE SODIUM 40 MG PO TBEC
40.0000 mg | DELAYED_RELEASE_TABLET | Freq: Every day | ORAL | Status: DC
  Administered 2020-11-22 – 2020-11-29 (×8): 40 mg via ORAL
  Filled 2020-11-21 (×8): qty 1

## 2020-11-21 MED ORDER — BUSPIRONE HCL 5 MG PO TABS
15.0000 mg | ORAL_TABLET | Freq: Three times a day (TID) | ORAL | Status: DC
  Administered 2020-11-21 – 2020-11-29 (×23): 15 mg via ORAL
  Filled 2020-11-21 (×24): qty 3

## 2020-11-21 MED ORDER — SODIUM CHLORIDE 0.9% FLUSH
3.0000 mL | Freq: Two times a day (BID) | INTRAVENOUS | Status: DC
  Administered 2020-11-24 – 2020-11-26 (×4): 3 mL via INTRAVENOUS

## 2020-11-21 MED ORDER — DILTIAZEM LOAD VIA INFUSION: 5.0000 mg | Freq: Once | INTRAVENOUS | Status: DC

## 2020-11-21 MED ORDER — MORPHINE SULFATE (PF) 2 MG/ML IV SOLN
INTRAVENOUS | Status: AC
  Administered 2020-11-21: 2 mg via INTRAMUSCULAR
  Filled 2020-11-21: qty 1

## 2020-11-21 MED ORDER — ALBUTEROL SULFATE (2.5 MG/3ML) 0.083% IN NEBU: 2.5000 mg | INHALATION_SOLUTION | Freq: Four times a day (QID) | RESPIRATORY_TRACT | Status: DC | PRN

## 2020-11-21 NOTE — ED Triage Notes (Signed)
Pt from [facility]. Per EMS pt has been on IV abx for MRSA in BLE. Staff found pt's SpO2 82% and bp 88/50 with initial pulse of 160. 500cc fluid bolus given with improvement. A/O x4. Reports chest pain when coughing.

## 2020-11-21 NOTE — Progress Notes (Signed)
NEW ADMISSION NOTE New Admission Note:   Arrival Method: E.D stretcher Mental Orientation: Alert and oriented  X 4 Telemetry: #22 nsr Assessment: Completed Skin:Multiple blister on left lower leg,thin dry skin .Assessed with Candice R.N. IV:  RUA single Picc line -heplock ,LEFT PIV Infusing. Pain:Denies. Tubes: Safety Measures: Safety Fall Prevention Plan has been given, discussed and signed Admission: Completed [facility] Orientation: Patient has been orientated to the room, unit and staff.  Family:  Orders have been reviewed and implemented. Will continue to monitor the patient. Call light has been placed within reach and bed alarm has been activated.   [place] Queen, Zenon Mayo, RN
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
## 2020-11-21 NOTE — Sepsis Progress Note (Signed)
Sepsis protocol is being followed by [facility]. 

## 2020-11-21 NOTE — Consult Note (Signed)
Date of Admission:  11/21/2020          Reason for Consult: Multifocal pneumonia and patient being treated for MRSA bacteremia   Referring Provider: Gareth Morgan, MD   Assessment:  1. Multifocal opacities in the lung some with cavitation consistent with likely septic embolization than right sided endocarditis 2. Hx of MRSA bacteremia 3. New headaches 4. LE wounds 5. CKD with hx of congenital absence of right kidney 6. Peripheral vascular disease 7. Rheumatoid arthritis 8. Psoriasis 9. Polymyalgia rheumatica  Plan:  1. Agree with blood cultures  2. Would discontinue urine cultures if possible 3. Agree with vancomycin and cefepime and Flagyl for now (though in case of the vancomycin we may need to move away from it if we run into nephrotoxicity 4. Repeat 2D echocardiogram and she is going to need another transesophageal echocardiogram 5. She needs more aggressive imaging of her head if she continues to have headaches due to concerns that she could have septic embolization to that site (note she is very claustrophobic and refuses to do an MRI so if we go down that route will need to do a head CT with IV contrast)  Active Problems:   Sepsis due to pneumonia ([place])   Scheduled Meds: Continuous Infusions: . [START ON 11/22/2020] ceFEPime (MAXIPIME) IV    . lactated ringers 150 mL/hr at 11/21/20 1137  . [START ON 11/22/2020] vancomycin     PRN Meds:.  HPI: [name] [name] is a 80 y.o. female with past medical history significant for rheumatoid arthritis, psoriasis hypertension coronary artery disease peripheral vascular disease, chronic kidney disease with agenesis of the right kidney, mission in February 2022 for community-acquired pneumonia.  Was then readmitted in April with evaluation for pulmonary embolism with negative CT angiogram.  Not have a pulmonary embolism at that time and there was no parenchymal lung disease other than a lung nodule that was seen.  He was found to  have multiple worrisome pustules over her lower extremities that had emerged in the week prior to admission.  Blood cultures came back positive for MRSA.  She was switched from vancomycin to daptomycin.  Blood cultures cleared    She underwent transthoracic echocardiogram and then transesophageal echocardiogram that failed to show evidence of endocarditis.  Was nonetheless sent out with plans for 4 weeks of daptomycin for  In the past 3 to 4 days she had developed left-sided pleuritic chest pain along with coughing of purulent sputum that then became tinged with blood.  She developed fevers as high as 102 degrees along with a headache.  She was sent from skilled nursing facility to the ER for evaluation.  In the ER her COVID test was negative.  She was found on chest x-ray to have evidence new large left left-sided infiltrates.  When I initially was called I was concerned about whether we were going to be dealing with yet another case of the "zebra" of daptomycin induced eosinophilic pneumonia that we have seen surprisingly quite a bit frequently in our service.  She has had a CT scan now however which shows multifocal opacities throughout the lungs with areas of cavitation which suggest that she has septic embolization likely from undiagnosed right-sided endocarditis.  Blood cultures have been taken she has been started on vancomycin cefepime and Flagyl.  I think that is a reasonable regimen for now though if her blood cultures remain negative I would probably switch to just targeting the MRSA with an antibiotic that  is effective in the lungs.  And her renal agenesis on the right side and chronic kidney disease, we may need to consider alternate regimens to vancomycin and daptomycin at this point.  Given her headaches I would also like to image her brain preferably with an MRI but she has significant claustrophobia and absolutely refuses having an MRI.  The neck step would be to get a CT  with contrast.  Will defer to the team at present whether to do that today or observe her and see if her headaches persist or go away.  In the latter case we can avoid using the contrast.  I also recheck inflammatory markers.  Wound care should see her for lower extremity wounds that she says are improving.  I spent greater than 80 minutes with the patient including greater than 50% of time in face to face counsel of the patient in radiology CT suite as well as the emergency department, reviewing her CAT scan personally also reviewing her other pernio pertinent radiographic findings prior echocardiograms microbiologic data and laboratory data and in coordination of her care.   Review of Systems: Review of Systems  Constitutional: Positive for chills, fever and malaise/fatigue. Negative for weight loss.  HENT: Negative for congestion and sore throat.   Eyes: Negative for blurred vision and photophobia.  Respiratory: Positive for cough, hemoptysis, sputum production and shortness of breath. Negative for wheezing.   Cardiovascular: Positive for chest pain. Negative for palpitations and leg swelling.  Gastrointestinal: Negative for abdominal pain, blood in stool, constipation, diarrhea, heartburn, melena, nausea and vomiting.  Genitourinary: Negative for dysuria, flank pain and hematuria.  Musculoskeletal: Negative for back pain, falls, joint pain and myalgias.  Skin: Negative for itching and rash.  Neurological: Positive for tremors and headaches. Negative for dizziness, focal weakness, loss of consciousness and weakness.  Endo/Heme/Allergies: Does not bruise/bleed easily.  Psychiatric/Behavioral: Negative for depression and suicidal ideas. The patient does not have insomnia.     Past Medical History:  Diagnosis Date  . Anxiety   . Arthritis    RA  . Carpal tunnel syndrome, bilateral   . Chronic kidney disease    CKD stage III, absence of left kidney  . CKD (chronic kidney disease)  stage 3, GFR 30-59 ml/min (HCC) 09/06/2019  . Congenital absence of one kidney    Pt has right kidney only  . Depression   . Dyslipidemia 09/06/2019  . Essential hypertension 09/06/2019  . GERD (gastroesophageal reflux disease)   . Headache   . Heart murmur   . Hypercholesteremia   . Hypertension   . Peripheral vascular disease ([place])    a. s/p R external iliac stent '06 with angioplasty '13 (Dr. Benjamine Sprague) b. 01/2016: s/p PTA/stenting in L common iliac artery (Dr. Gwenlyn Found)  . Polymyalgia rheumatica ([place])   . Psoriasis   . Sleep apnea   . Tremor 09/06/2019    Social History   Tobacco Use  . Smoking status: Former Smoker    Packs/day: 1.00    Years: 40.00    Pack years: 40.00    Types: Cigarettes    Quit date: 07/24/2010    Years since quitting: 10.3  . Smokeless tobacco: Never Used  . Tobacco comment: STARTED BACK JULY 2013 AND RECENTLY QUIT 03-10-2012  Vaping Use  . Vaping Use: Never used  Substance Use Topics  . Alcohol use: Never    Comment: QUIT IN 1999  . Drug use: Never    Family History  Problem Relation Age  of Onset  . Angina Mother   . Congestive Heart Failure Mother   . Heart attack Father   . Melanoma Brother   . Congestive Heart Failure Brother   . Heart disease Brother   . Cancer Sister        Lymphoma  . Neuropathy Sister   . Brain cancer Sister   . Osteoarthritis Daughter    Allergies  Allergen Reactions  . Statins   . Gabapentin Itching    Was deleted since patient was taking, but now experiencing itching again.   . Statins Swelling, Rash and Other (See Comments)    Swelling involving tongue; also causes muscle pain    OBJECTIVE: Blood pressure (!) 108/55, pulse (!) 106, temperature (!) 101.3 F (38.5 C), temperature source Rectal, resp. rate 19, height 5' (1.524 m), weight 63 kg, SpO2 94 %.  Physical Exam Constitutional:      General: She is not in acute distress.    Appearance: She is obese. She is not diaphoretic.  HENT:     Head:  Normocephalic and atraumatic.     Right Ear: External ear normal.     Left Ear: External ear normal.     Nose: Nose normal.     Mouth/Throat:     Pharynx: No oropharyngeal exudate.  Eyes:     General: No scleral icterus.    Conjunctiva/sclera: Conjunctivae normal.     Pupils: Pupils are equal, round, and reactive to light.  Cardiovascular:     Rate and Rhythm: Normal rate and regular rhythm.     Heart sounds: Normal heart sounds. No murmur heard. No friction rub. No gallop.   Pulmonary:     Effort: Pulmonary effort is normal. No respiratory distress.     Breath sounds: Examination of the right-lower field reveals decreased breath sounds. Examination of the left-lower field reveals decreased breath sounds. Decreased breath sounds present. No wheezing.  Abdominal:     General: Bowel sounds are normal. There is no distension.     Palpations: Abdomen is soft.     Tenderness: There is no abdominal tenderness. There is no rebound.  Musculoskeletal:        General: No tenderness. Normal range of motion.     Cervical back: Normal range of motion and neck supple.  Lymphadenopathy:     Cervical: No cervical adenopathy.  Skin:    General: Skin is warm and dry.     Coloration: Skin is not pale.     Findings: No erythema or rash.  Neurological:     General: No focal deficit present.     Mental Status: She is alert and oriented to person, place, and time.     Coordination: Coordination normal.  Psychiatric:        Behavior: Behavior normal.        Thought Content: Thought content normal.        Judgment: Judgment normal.   Lower extremities wrapped in bandages  PICC line clean Nov 21, 2020:      Lab Results Lab Results  Component Value Date   WBC 7.4 11/21/2020   HGB 9.3 (L) 11/21/2020   HCT 30.1 (L) 11/21/2020   MCV 104.2 (H) 11/21/2020   PLT 209 11/21/2020    Lab Results  Component Value Date   CREATININE 0.88 11/21/2020   BUN 9 11/21/2020   NA 138 11/21/2020   K 4.8  11/21/2020   CL 102 11/21/2020   CO2 30 11/21/2020    Lab  Results  Component Value Date   ALT 6 11/21/2020   AST 11 (L) 11/21/2020   ALKPHOS 66 11/21/2020   BILITOT 0.4 11/21/2020     Microbiology: Recent Results (from the past 240 hour(s))  Resp Panel by RT-PCR (Flu A&B, Covid) Nasopharyngeal Swab     Status: None   Collection Time: 11/21/20 10:55 AM   Specimen: Nasopharyngeal Swab; Nasopharyngeal(NP) swabs in vial transport medium  Result Value Ref Range Status   SARS Coronavirus 2 by RT PCR NEGATIVE NEGATIVE Final    Comment: (NOTE) SARS-CoV-2 target nucleic acids are NOT DETECTED.  The SARS-CoV-2 RNA is generally detectable in upper respiratory specimens during the acute phase of infection. The lowest concentration of SARS-CoV-2 viral copies this assay can detect is 138 copies/mL. A negative result does not preclude SARS-Cov-2 infection and should not be used as the sole basis for treatment or other patient management decisions. A negative result may occur with  improper specimen collection/handling, submission of specimen other than nasopharyngeal swab, presence of viral mutation(s) within the areas targeted by this assay, and inadequate number of viral copies(<138 copies/mL). A negative result must be combined with clinical observations, patient history, and epidemiological information. The expected result is Negative.  Fact Sheet for Patients:  EntrepreneurPulse.com.au  Fact Sheet for Healthcare Providers:  IncredibleEmployment.be  This test is no t yet approved or cleared by the [place] FDA and  has been authorized for detection and/or diagnosis of SARS-CoV-2 by FDA under an Emergency Use Authorization (EUA). This EUA will remain  in effect (meaning this test can be used) for the duration of the COVID-19 declaration under Section 564(b)(1) of the Act, 21 U.S.C.section 360bbb-3(b)(1), unless the authorization is terminated   or revoked sooner.       Influenza A by PCR NEGATIVE NEGATIVE Final   Influenza B by PCR NEGATIVE NEGATIVE Final    Comment: (NOTE) The Xpert Xpress SARS-CoV-2/FLU/RSV plus assay is intended as an aid in the diagnosis of influenza from Nasopharyngeal swab specimens and should not be used as a sole basis for treatment. Nasal washings and aspirates are unacceptable for Xpert Xpress SARS-CoV-2/FLU/RSV testing.  Fact Sheet for Patients: EntrepreneurPulse.com.au  Fact Sheet for Healthcare Providers: IncredibleEmployment.be  This test is not yet approved or cleared by the [place] FDA and has been authorized for detection and/or diagnosis of SARS-CoV-2 by FDA under an Emergency Use Authorization (EUA). This EUA will remain in effect (meaning this test can be used) for the duration of the COVID-19 declaration under Section 564(b)(1) of the Act, 21 U.S.C. section 360bbb-3(b)(1), unless the authorization is terminated or revoked.  Performed at [facility], [place] [address], [place], [place] [address]     Alcide Evener, [facility] [phone] pager  11/21/2020, 2:23 PM

## 2020-11-21 NOTE — H&P (Signed)
History and Physical    [name] [name] NFA:[id number] DOB: 03-30-1941 DOA: 11/21/2020  Referring MD/NP/PA: Gareth Morgan, MD PCP: [name], MD  Patient coming from: [facility] healthcare via EMS  Chief Complaint: Chest pain  I have personally briefly reviewed patient's old medical records in [place]   HPI: [name] [name] is a 80 y.o. female with medical history significant of hypertension, hyperlipidemia, PVD, PMR, and congenital solitary kidney presents with complaints of chest pain.  Patient just recently been hospitalized from 4/16-4/28 for MRSA bacteremia after 1 of 2 blood cultures came back positive.  During the hospitalization patient had underwent TEE which showed no signs of vegetation and ID consulted with recommendations of daptomycin to be continued for 4 weeks through PICC line.  Repeated blood cultures from 4/22 have been negative negative.  She was sent to [facility] to complete treatment.  This morning morning around 4 AM she reports that she was awoken out of her sleep with coughing with left-sided chest pain that did not radiate and some shortness of breath.  Pain seem to be worsening when trying to take inspiratory breath.  She had been having some discomfort on the left side of her chest when coughing over the last few days.  Notes that she has had associated symptoms of headache and complains of some mild nausea.  Denies any vomiting, abdominal pain, diarrhea, constipation, or dysuria.  She is tested weekly at the facility for COVID and was negative as of 6 days ago.  She also reports that she has had bilateral lower extremity wounds related with MRSA that have been healing.  Found by staff to have O2 saturations of 86% and blood pressure 88/50 with initial pulse of 160.  Patient had been given 500 mL of IV fluids in route with EMS with improvement in blood pressures.  Alert and oriented x4.  ED Course: Upon admission into the emergency department patient was  noted to be febrile up to 101.3 F, pulse 10 9-1 18, respiration 9-24, blood pressures maintained, and O2 saturations as low as 87% with improvement on 3 L nasal cannula oxygen up to 97%. Labs significant for WBC 7.4, hemoglobin 9.3, albumin 2.3, troponin 7, and lactic acid 1.  Blood and urine cultures have been obtained.  Urinalysis did not give concern for infection.  Chest x-ray gave concern for possible infiltrates of the left upper and lower lung.  Sepsis protocol has been initiated and cultures obtained.  This was discussed with infectious disease who recommended stopping daptomycin.  Patient was placed on empiric antibiotics of vancomycin and metronidazole, and cefepime.  TRH called to admit.   Review of Systems  Constitutional: Positive for chills and malaise/fatigue.  HENT: Negative for ear discharge and nosebleeds.   Eyes: Negative for photophobia and pain.  Respiratory: Positive for cough and shortness of breath.   Cardiovascular: Positive for chest pain and leg swelling.  Gastrointestinal: Positive for nausea. Negative for abdominal pain, blood in stool, diarrhea and vomiting.  Genitourinary: Negative for hematuria.  Musculoskeletal: Negative for falls.  Skin:       Positive bilateral lower extremity wounds  Neurological: Negative for focal weakness and loss of consciousness.  Psychiatric/Behavioral: Negative for substance abuse. The patient has insomnia.     Past Medical History:  Diagnosis Date  . Anxiety   . Arthritis    RA  . Carpal tunnel syndrome, bilateral   . Chronic kidney disease    CKD stage III, absence of  left kidney  . CKD (chronic kidney disease) stage 3, GFR 30-59 ml/min (HCC) 09/06/2019  . Congenital absence of one kidney    Pt has right kidney only  . Depression   . Dyslipidemia 09/06/2019  . Essential hypertension 09/06/2019  . GERD (gastroesophageal reflux disease)   . Headache   . Heart murmur   . Hypercholesteremia   . Hypertension   . Peripheral  vascular disease ([place])    a. s/p R external iliac stent '06 with angioplasty '13 (Dr. Benjamine Sprague) b. 01/2016: s/p PTA/stenting in L common iliac artery (Dr. Gwenlyn Found)  . Polymyalgia rheumatica ([place])   . Psoriasis   . Sleep apnea   . Tremor 09/06/2019    Past Surgical History:  Procedure Laterality Date  . ABDOMINAL HYSTERECTOMY  1963   Partial,  Due to bleeding after delivery  . ANTERIOR CERVICAL DECOMP/DISCECTOMY FUSION  04/19/2012   Procedure: ANTERIOR CERVICAL DECOMPRESSION/DISCECTOMY FUSION 2 LEVELS;  Surgeon: Winfield Cunas, MD;  Location: [facility];  Service: Neurosurgery;  Laterality: N/A;  Cervical four-five,Cervical five-six anterior cervical decompression with fusion plating and bonegraft possible posterior cervical decompression  . APPENDECTOMY    . CARPAL TUNNEL RELEASE  1990  . CERVICAL FUSION  04/19/2012  . EYE SURGERY    . ILIAC ARTERY STENT    . PERIPHERAL VASCULAR CATHETERIZATION N/A 01/31/2016   Procedure: Abdominal Aortogram;  Surgeon: [name], MD;  Location: [facility];  Service: Cardiovascular;  Laterality: N/A;  . PERIPHERAL VASCULAR CATHETERIZATION Bilateral 01/31/2016   Procedure: Lower Extremity Angiography;  Surgeon: [name], MD;  Location: [place] [facility];  Service: Cardiovascular;  Laterality: Bilateral;  . PERIPHERAL VASCULAR CATHETERIZATION Left 01/31/2016   Procedure: Peripheral Vascular Intervention;  Surgeon: [name], MD;  Location: [facility];  Service: Cardiovascular;  Laterality: Left;  ILIAC  . TEE WITHOUT CARDIOVERSION N/A 11/01/2020   Procedure: TRANSESOPHAGEAL ECHOCARDIOGRAM (TEE);  Surgeon: Sueanne Margarita, MD;  Location: [facility] ENDOSCOPY;  Service: Cardiovascular;  Laterality: N/A;  . TONSILLECTOMY       reports that she quit smoking about 10 years ago. Her smoking use included cigarettes. She has a 40.00 pack-year smoking history. She has never used smokeless tobacco. She reports that she does not drink alcohol  and does not use drugs.  Allergies  Allergen Reactions  . Statins   . Gabapentin Itching    Was deleted since patient was taking, but now experiencing itching again.   . Statins Swelling, Rash and Other (See Comments)    Swelling involving tongue; also causes muscle pain    Family History  Problem Relation Age of Onset  . Angina Mother   . Congestive Heart Failure Mother   . Heart attack Father   . Melanoma Brother   . Congestive Heart Failure Brother   . Heart disease Brother   . Cancer Sister        Lymphoma  . Neuropathy Sister   . Brain cancer Sister   . Osteoarthritis Daughter     Prior to Admission medications   Medication Sig Start Date End Date Taking? Authorizing Provider  acetaminophen (TYLENOL) 500 MG tablet Take 500 mg by mouth every 8 (eight) hours as needed for moderate pain.    [provider]  albuterol (VENTOLIN HFA) 108 (90 Base) MCG/ACT inhaler Inhale 2 puffs into the lungs every 6 (six) hours as needed for cough, wheezing or shortness of breath. 07/22/19   [provider]  amLODipine (  NORVASC) 2.5 MG tablet Take 1 tablet (2.5 mg total) by mouth daily. 11/05/20   Antonieta Pert, MD  Ascorbic Acid (VITAMIN C PO) Take 1 tablet by mouth daily.    [provider]  aspirin 81 MG EC tablet Take 1 tablet (81 mg total) by mouth daily. 11/04/20   Antonieta Pert, MD  busPIRone (BUSPAR) 15 MG tablet Take 1 tablet (15 mg total) by mouth 3 (three) times daily. 11/27/19   Nicolette Bang, DO  cholecalciferol (VITAMIN D) 1000 units tablet Take 1,000 Units by mouth daily.    [provider]  clopidogrel (PLAVIX) 75 MG tablet Take 1 tablet (75 mg total) by mouth every morning. 02/18/18   [name], MD  Cyanocobalamin (VITAMIN B 12 PO) Take 1 tablet by mouth daily. 12/25/12   [provider]  daptomycin (CUBICIN) IVPB Inject 500 mg into the vein daily for 25 days. Indication:  MRSA Bacteremia First Dose: Yes Last Day of Therapy:   11/26/20 Labs - Once weekly:  CBC/D, BMP, and CPK Labs - Every other week:  ESR and CRP Method of administration: IV Push Method of administration may be changed at the discretion of home infusion pharmacist based upon assessment of the patient and/or caregiver's ability to self-administer the medication ordered. 11/04/20 11/29/20  Antonieta Pert, MD  divalproex (DEPAKOTE) 125 MG DR tablet Take 125 mg by mouth 2 (two) times daily. 08/12/19   [provider]  Dorzolamide HCl-Timolol Mal PF 2-0.5 % SOLN Place 1 drop into both eyes 2 (two) times daily. 04/30/19   [provider]  ferrous sulfate 325 (65 FE) MG tablet Take 325 mg by mouth daily. 04/02/19   Haydee Salter, NP  furosemide (LASIX) 20 MG tablet Take 20 mg by mouth every other day as needed. 09/28/20   [provider]  gabapentin (NEURONTIN) 100 MG capsule Take 100 mg by mouth 3 (three) times daily.    [provider]  Latanoprostene Bunod 0.024 % SOLN Place 1 drop into both eyes at bedtime. 04/30/19   [provider]  mirtazapine (REMERON) 15 MG tablet Take 1 tablet (15 mg total) by mouth at bedtime. 09/11/18   Zenia Resides, MD  pantoprazole (PROTONIX) 40 MG tablet Take 40 mg by mouth daily. 08/21/19   [provider]  tamsulosin (FLOMAX) 0.4 MG CAPS capsule Take 1 capsule (0.4 mg total) by mouth daily after supper. 11/04/20   Antonieta Pert, MD  traMADol (ULTRAM) 50 MG tablet Take 1 tablet (50 mg total) by mouth 3 (three) times daily as needed for up to 4 doses for moderate pain. 11/04/20   Antonieta Pert, MD    Physical Exam:  Constitutional: Elderly female who appears to be in some distress Vitals:   11/21/20 1032 11/21/20 1045 11/21/20 1106 11/21/20 1139  BP:  (!) 129/101 100/61 (!) 124/52  Pulse:  (!) 118 (!) 110 (!) 110  Resp:  18 17 (!) 9  Temp:      TempSrc:      SpO2: 97% 97% 94% 96%  Weight:      Height:       Eyes: PERRL, lids and conjunctivae normal ENMT: Mucous membranes are dry.  Posterior pharynx clear of any exudate or lesions.  Neck: normal, supple, no masses, no thyromegaly Respiratory: clear to auscultation bilaterally, no wheezing, no crackles. Normal respiratory effort. No accessory muscle use.  Cardiovascular: Tachycardic, no murmurs / rubs / gallops. No extremity edema. 2+ pedal pulses. No carotid bruits.  Abdomen: no tenderness, no masses palpated. No hepatosplenomegaly. Bowel sounds positive.  Musculoskeletal: no clubbing / cyanosis. No joint deformity upper and lower extremities. Good ROM, no contractures. Normal muscle tone.  Skin: Bilateral lower extremities wrapped Neurologic: CN 2-12 grossly intact. Sensation intact, DTR normal. Strength 5/5 in all 4.  Psychiatric: Normal judgment and insight. Alert and oriented x 3. Normal mood.     Labs on Admission: I have personally reviewed following labs and imaging studies  CBC: Recent Labs  Lab 11/21/20 1050  WBC 7.4  NEUTROABS 4.7  HGB 9.3*  HCT 30.1*  MCV 104.2*  PLT 884   Basic Metabolic Panel: Recent Labs  Lab 11/21/20 1050  NA 138  K 4.8  CL 102  CO2 30  GLUCOSE 107*  BUN 9  CREATININE 0.88  CALCIUM 8.7*   GFR: Estimated Creatinine Clearance: 41.6 mL/min (by C-G formula based on SCr of 0.88 mg/dL). Liver Function Tests: Recent Labs  Lab 11/21/20 1050  AST 11*  ALT 6  ALKPHOS 66  BILITOT 0.4  PROT 5.9*  ALBUMIN 2.3*   No results for input(s): LIPASE, AMYLASE in the last 168 hours. No results for input(s): AMMONIA in the last 168 hours. Coagulation Profile: Recent Labs  Lab 11/21/20 1050  INR 1.1   Cardiac Enzymes: No results for input(s): CKTOTAL, CKMB, CKMBINDEX, TROPONINI in the last 168 hours. BNP (last 3 results) No results for input(s): PROBNP in the last 8760 hours. HbA1C: No results for input(s): HGBA1C in the last 72 hours. CBG: No results for input(s): GLUCAP in the last 168 hours. Lipid Profile: No results for input(s): CHOL, HDL, LDLCALC, TRIG, CHOLHDL,  LDLDIRECT in the last 72 hours. Thyroid Function Tests: No results for input(s): TSH, T4TOTAL, FREET4, T3FREE, THYROIDAB in the last 72 hours. Anemia Panel: No results for input(s): VITAMINB12, FOLATE, FERRITIN, TIBC, IRON, RETICCTPCT in the last 72 hours. Urine analysis:    Component Value Date/Time   COLORURINE YELLOW 11/21/2020 1059   APPEARANCEUR CLEAR 11/21/2020 1059   LABSPEC 1.010 11/21/2020 1059   PHURINE 6.0 11/21/2020 1059   GLUCOSEU NEGATIVE 11/21/2020 1059   [place] 11/21/2020 [place] 11/21/2020 1059   BILIRUBINUR negative 02/18/2020 0906   BILIRUBINUR NEG 06/08/2015 [place] 11/21/2020 1059   PROTEINUR NEGATIVE 11/21/2020 1059   UROBILINOGEN 0.2 02/18/2020 0906   UROBILINOGEN 0.2 10/19/2014 1619   NITRITE NEGATIVE 11/21/2020 1059   LEUKOCYTESUR NEGATIVE 11/21/2020 1059   Sepsis Labs: No results found for this or any previous visit (from the past 240 hour(s)).   Radiological Exams on Admission: CT Chest Wo Contrast  Result Date: 11/21/2020 CLINICAL DATA:  80 year old who is currently on IV antibiotics for MRSA infection in the lower extremities, found by the nursing home staff earlier today to be hypotensive, tachycardic and hypoxic with oxygen saturation of 82% on room air. Acute cough. Chest pain while coughing. EXAM: CT CHEST WITHOUT CONTRAST TECHNIQUE: Multidetector CT imaging of the chest was performed following the standard protocol without IV contrast. COMPARISON:  CTA chest 10/25/2020 and earlier. FINDINGS: Cardiovascular: Stable normal heart size. Mild aortic valvular calcification. Moderate to severe three-vessel coronary atherosclerosis. No pericardial effusion. Moderate to severe atherosclerosis involving the thoracic and upper abdominal aorta without evidence of aneurysm. Atherosclerosis involving the proximal great vessels. Mediastinum/Nodes: No pathologically enlarged mediastinal, hilar or axillary lymph nodes. No  mediastinal masses. Normal-appearing esophagus. Approximate 2.0 cm nodule involving the LEFT lobe of the thyroid gland, stable dating back to 2015,  confirming benignity. Lungs/Pleura: Since the CT last month, development of peripheral airspace opacities throughout both lungs, predominantly in the upper lobes, and development of scattered BILATERAL lung nodules. A few of the peripheral opacities have central necrosis. Scar/atelectasis deep in the lower lobes bilaterally, unchanged. Central airways patent with severe bronchial wall thickening. Upper Abdomen: Visualized LEFT kidney atrophic. Mild pancreatic atrophy. Visualized upper abdomen otherwise unremarkable for the unenhanced technique. Opaque ingested material in the stomach. Musculoskeletal: Osseous demineralization. Degenerative disc disease and spondylosis throughout the thoracic spine and the upper lumbar spine. Upper thoracic dextroscoliosis and thoracolumbar levoscoliosis. IMPRESSION: 1. New peripheral airspace opacities throughout both lungs with an upper lobe predominance and new scattered BILATERAL lung nodule since the prior CT 1 month ago. Some of the peripheral opacities demonstrate central necrosis. The distribution and the time course suggests septic emboli may be the etiology. 2. Stable scar/atelectasis deep in the lower lobes bilaterally. 3. 2.0 cm LEFT lobe thyroid nodule which has been stable dating back to at 1. Stability for greater than 5 years implies benignity; no biopsy or followup indicated. (Ref: J Am Coll Radiol. 2015 Feb;12(2): 143-50). Aortic Atherosclerosis (ICD10-I70.0). Electronically Signed   By: Evangeline Dakin M.D.   On: 11/21/2020 12:10   DG Chest Port 1 View  Result Date: 11/21/2020 CLINICAL DATA:  Questionable sepsis. EXAM: PORTABLE CHEST 1 VIEW COMPARISON:  10/23/2020 FINDINGS: Patient is a new RIGHT-sided PICC line, tip overlying the superior vena cava. Heart size is normal. Interval development of patchy opacity  in the LEFT UPPER lobe and increased interstitial markings particularly at the bases. Remote cervical fusion. IMPRESSION: New LEFT UPPER lobe infiltrate and bibasilar atelectasis or developing LOWER lobe infiltrates. Electronically Signed   By: Nolon Nations M.D.   On: 11/21/2020 11:27    EKG: Independently reviewed. Sinus tachycardia at 114 bpm  Assessment/Plan Sepsis secondary to healthcare associated pneumonia history of MRSA bacteremia: Acute.  Patient presented with fever up to 101 F with tachycardia and tachypnea.  Chest x-ray concerning for left-sided pneumonia.  Case had been discussed with ID who recommended discontinuation of daptomycin and to broaden antibiotic coverage.  Patient has been started on vancomycin, cefepime, and metronidazole. -Admit to a cardiac telemetry bed -Follow-up blood and sputum cultures  -Check procalcitonin -Continue empiric antibiotics of vancomycin, cefepime, and metronidazole -Appreciate ID consultative services, we will follow-up for any further recommendations  Septic emboli: Acute.  Recent CT of the chest without contrast gave concern for septic emboli.  Patient had just underwent TEE on 4/25 that did not show any concern for vegetation.  She also was complaining of a headache for which a CT scan of the brain was also performed, but did not note any acute abnormalities -Follow-up blood cultures  Macrocytic anemia: Stable.  Hemoglobin 9.3 g/dL with MCV 104.2 which appears to be near patient's baseline.  No reports of bleeding. -Check vitamin B12 and folate and -Continue to monitor H&H  Hypoalbuminemia: On admission albumin 2.3. -Check prealbumin  PMR: Patient seen by Dr. [name] of rheumatology on 5/9 and recommended to continue holding prednisone at this time and to follow back up in the office in a couple weeks on 12/13/2020 -Continue outpatient follow-up  Essential hypertension: Blood pressures currently maintained. -Consider restarting blood  pressure medications when medically appropriate.  Lower extremity wounds -Wound care consult ordered -Continue previous wound care orders(Gently wash BLE with soap and water. Pat dry. Apply Sween Moisturizing Ointment to intact skin. Place a Xeroform gauze Kellie Simmering [phone]) over each blister.  Beginning behind the toes and going to just below the knees, spiral wrap kerlex. Perform daily. MOISTEN old dressing with saline prior to removing)  Congenital solitary kidney: Kidney function appears to be near baseline at this time. -Continue to monitor  Peripheral vascular disease -Continue Plavix and asa  DNR: Present on admission  DVT prophylaxis: Lovenox Code Status: DNR Family Communication: None requested Disposition Plan: To be determined Consults called: ID Admission status: Inpatient, require more than 2 midnight stay  Norval Morton MD [facility]   If 7PM-7AM, please contact night-coverage   11/21/2020, 12:52 PM

## 2020-11-21 NOTE — Progress Notes (Signed)
Pharmacy Antibiotic Note  [name] [name] is a 80 y.o. female admitted on 11/21/2020 with infection of unknown source.  Pharmacy has been consulted for Cefepime and vancomycin dosing. Of note, patient had recent admission with MRSA pneumonia s/p daptomycin.   WBC wnl, SCr wnl, LA 1  Plan: -Start Cefepime 2 gm IV Q 12 hours -Vancomycin 1250 mg IV load followed by Vancomycin 500 mg IV Q 24 hrs. Goal AUC 400-550. Expected AUC: 462 SCr used: 0.88 -Monitor CBC, renal fx, cultures and clinical progress -Vanc levels as indicated    Height: 5' (152.4 cm) Weight: 63 kg (138 lb 14.2 oz) IBW/kg (Calculated) : 45.5  Temp (24hrs), Avg:101.3 F (38.5 C), Min:101.3 F (38.5 C), Max:101.3 F (38.5 C)  No results for input(s): WBC, CREATININE, LATICACIDVEN, VANCOTROUGH, VANCOPEAK, VANCORANDOM, GENTTROUGH, GENTPEAK, GENTRANDOM, TOBRATROUGH, TOBRAPEAK, TOBRARND, AMIKACINPEAK, AMIKACINTROU, AMIKACIN in the last 168 hours.  Estimated Creatinine Clearance: 39.7 mL/min (by C-G formula based on SCr of 0.92 mg/dL).    Allergies  Allergen Reactions  . Statins   . Gabapentin Itching    Was deleted since patient was taking, but now experiencing itching again.   . Statins Swelling, Rash and Other (See Comments)    Swelling involving tongue; also causes muscle pain    Antimicrobials this admission: Cefepime 5/15 >>  Vanc 5/15 >>   Dose adjustments this admission:   Microbiology results: 5/15 BCx:  5/15 UCx:     Thank you for allowing pharmacy to be a part of this patient's care.  Albertina Parr, PharmD., BCPS, BCCCP Clinical Pharmacist Please refer to [facility] for unit-specific pharmacist

## 2020-11-21 NOTE — Significant Event (Signed)
Rapid Response Event Note   Reason for Call :  Tachycardia(150s), L sided chest pain 8/10 during inspiration(this pain was present on admission).  Called originally at 2035 however RRT unable to see pt at that time. EKG ordered showing ST. MD notified and ordered 5mg  IV cardizem and 2mg  morphine.  Pt seen at 2130.  Initial Focused Assessment:  Pt lying in bed in no distress, alert and oriented. Pt having tremors which, per pt, is normal. Pt says her chest pain is a lot better compared to earlier. Pt lungs diminished t/o. Skin hot to touch.   T-100.4(R), HR-118(Cardizem had just been given), BP-141/71, RR-22, SpO2-94% on 2L [place]   Interventions:  EKG-ST 5mg  cardizem IV 2mg  morphine IV Plan of Care:  Pt HR-118 after interventions, pain is better as well. RN to give tylenol. Continue to monitor pt closely. Call RRT if further assistance needed.   Event Summary:   MD Notified:  Call Time:2035 Arrival Time:2130 End [id number]  Dillard Essex, RN

## 2020-11-22 ENCOUNTER — Inpatient Hospital Stay (HOSPITAL_COMMUNITY): Payer: PPO

## 2020-11-22 DIAGNOSIS — I38 Endocarditis, valve unspecified: Secondary | ICD-10-CM

## 2020-11-22 DIAGNOSIS — A419 Sepsis, unspecified organism: Secondary | ICD-10-CM | POA: Diagnosis not present

## 2020-11-22 DIAGNOSIS — Z66 Do not resuscitate: Secondary | ICD-10-CM | POA: Diagnosis not present

## 2020-11-22 DIAGNOSIS — I10 Essential (primary) hypertension: Secondary | ICD-10-CM | POA: Diagnosis not present

## 2020-11-22 DIAGNOSIS — J189 Pneumonia, unspecified organism: Secondary | ICD-10-CM | POA: Diagnosis not present

## 2020-11-22 LAB — CBC
HCT: 24.9 % — ABNORMAL LOW (ref 36.0–46.0)
Hemoglobin: 7.8 g/dL — ABNORMAL LOW (ref 12.0–15.0)
MCH: 32.9 pg (ref 26.0–34.0)
MCHC: 31.3 g/dL (ref 30.0–36.0)
MCV: 105.1 fL — ABNORMAL HIGH (ref 80.0–100.0)
Platelets: 170 10*3/uL (ref 150–400)
RBC: 2.37 MIL/uL — ABNORMAL LOW (ref 3.87–5.11)
RDW: 15.5 % (ref 11.5–15.5)
WBC: 5.3 10*3/uL (ref 4.0–10.5)
nRBC: 0 % (ref 0.0–0.2)

## 2020-11-22 LAB — BASIC METABOLIC PANEL
Anion gap: 6 (ref 5–15)
BUN: 8 mg/dL (ref 8–23)
CO2: 31 mmol/L (ref 22–32)
Calcium: 8.8 mg/dL — ABNORMAL LOW (ref 8.9–10.3)
Chloride: 105 mmol/L (ref 98–111)
Creatinine, Ser: 0.8 mg/dL (ref 0.44–1.00)
GFR, Estimated: >60 mL/min (ref >60)
Glucose, Bld: 84 mg/dL (ref 70–99)
Potassium: 4.5 mmol/L (ref 3.5–5.1)
Sodium: 142 mmol/L (ref 135–145)

## 2020-11-22 LAB — ECHOCARDIOGRAM COMPLETE
Area-P 1/2: 3.08 cm2
Height: 60 in
S' Lateral: 3 cm
Weight: 2222.24 oz

## 2020-11-22 LAB — SEDIMENTATION RATE: Sed Rate: 108 mm/hr — ABNORMAL HIGH (ref 0–22)

## 2020-11-22 LAB — C-REACTIVE PROTEIN: CRP: 16.5 mg/dL — ABNORMAL HIGH (ref <1.0)

## 2020-11-22 LAB — PREALBUMIN: Prealbumin: 8.2 mg/dL — ABNORMAL LOW (ref 18–38)

## 2020-11-22 LAB — VITAMIN B12: Vitamin B-12: 347 pg/mL (ref 180–914)

## 2020-11-22 LAB — MRSA PCR SCREENING: MRSA by PCR: NEGATIVE

## 2020-11-22 LAB — FOLATE: Folate: 9.8 ng/mL (ref >5.9)

## 2020-11-22 MED ORDER — LACTATED RINGERS IV SOLN: INTRAVENOUS | Status: AC

## 2020-11-22 MED ORDER — SODIUM CHLORIDE 0.9% FLUSH
10.0000 mL | INTRAVENOUS | Status: DC | PRN
  Administered 2020-11-23: 10 mL

## 2020-11-22 MED ORDER — DM-GUAIFENESIN ER 30-600 MG PO TB12
1.0000 | ORAL_TABLET | Freq: Two times a day (BID) | ORAL | Status: DC
  Administered 2020-11-22 – 2020-11-29 (×15): 1 via ORAL
  Filled 2020-11-22 (×15): qty 1

## 2020-11-22 MED ORDER — SODIUM CHLORIDE 0.9% FLUSH
10.0000 mL | Freq: Two times a day (BID) | INTRAVENOUS | Status: DC
Start: 2020-11-22 — End: 2020-11-30
  Administered 2020-11-24 – 2020-11-26 (×3): 10 mL

## 2020-11-22 NOTE — Progress Notes (Signed)
  Echocardiogram 2D Echocardiogram has been performed.  [name] [name] G [name] 11/22/2020, 11:05 AM

## 2020-11-22 NOTE — Consult Note (Signed)
WOC Nurse Consult Note: Patient receiving care in [facility] [phone]. Primary RN, Angela Nevin, present at time of my assessment. Reason for Consult: BLE wounds Wound type: ruptured bullae. Much improved from when she was inpatient in April of this year. Pressure Injury POA: Yes/No/NA Measurement: Wound bed: Drainage (amount, consistency, odor)  Periwound: Dressing procedure/placement/frequency: Wash legs with soap and water. Pat dry. Apply Sween Moisturizing Ointment. Apply as many Xeroform gauzes Kellie Simmering [phone]) as necessary to cover all intact or ruptured blisters on BLE. Beginning behind the toes and going to just below the knees, spiral wrap kerlex, then 4 inch ace wrap. Perform daily.  Thank you for the consult.  Discussed plan of care with the patient and bedside nurse.  [place] nurse will not follow at this time.  Please re-consult the [place] team if needed.  Val Riles, RN, MSN, CWOCN, CNS-BC, pager [phone]

## 2020-11-22 NOTE — Progress Notes (Signed)
   11/21/20 2027  Assess: MEWS Score  Temp 99.4 F (37.4 C)  BP 131/79  Pulse Rate (!) 158  Resp (!) 22  Level of Consciousness Alert  SpO2 92 %  O2 Device Nasal Cannula  Patient Activity (if Appropriate) In bed  O2 Flow Rate (L/min) 2 L/min  Assess: MEWS Score  MEWS Temp 0  MEWS Systolic 0  MEWS Pulse 3  MEWS RR 1  MEWS LOC 0  MEWS Score 4  MEWS Score Color Red  Assess: if the MEWS score is Yellow or Red  Were vital signs taken at a resting state? Yes  Focused Assessment Change from prior assessment (see assessment flowsheet)  Early Detection of Sepsis Score *See Row Information* Medium  MEWS guidelines implemented *See Row Information* Yes  Treat  MEWS Interventions Escalated (See documentation below)  Pain Scale 0-10  Pain Score 8  Pain Type Acute pain  Pain Location Chest  Take Vital Signs  Increase Vital Sign Frequency  Red: Q 1hr X 4 then Q 4hr X 4, if remains red, continue Q 4hrs  Escalate  MEWS: Escalate Red: discuss with charge nurse/RN and provider, consider discussing with RRT  Notify: Charge Nurse/RN  Name of Charge Nurse/RN Notified Charito B RN  Date Charge Nurse/RN Notified 11/21/20  Time Charge Nurse/RN Notified 2027   After cleaning patient of incontinent bowel episode, VS were obtained.  It was noted that her HR was sustaining greater than 152.  She was c/o left chest pain that was 8/10 when taking deep breath.  She did have productive cough, rhonchi.  Mentation was A & O x4 and cooperative.  She was anxious and expressing that she felt that she was going to die.  CN notified.  Rapid Response Nurse notified.  Dr. Sidney Ace notified.  EKG obtained and showed ST in 150s.  Rectal Temp showed 100.4.  Red MEWS protocol implemented.  IV Cardizem 5 mg given.  Tylenol 650 mg PO given for fever.  IV Morphine 2 mg given for chest pain.  VS stabalized after interventions.  Will continue to monitor patient.  Earleen Reaper RN

## 2020-11-22 NOTE — Progress Notes (Signed)
[place] for Infectious Disease    Date of Admission:  11/21/2020   Total days of antibiotics 2/vanco-cefepime-metronidazole           ID: [name] [name] is a 80 y.o. adult with  Principal Problem:   Sepsis due to pneumonia [facility]) Active Problems:   Polymyalgia rheumatica ([place])   Peripheral vascular disease ([place])   Essential hypertension   Septic embolism ([place])   DNR (do not resuscitate)    Subjective: Febrile on admit to 102F yesterday, today afebrile but sleepy. Undergoing TTE  Medications:  . aspirin  81 mg Oral Daily  . busPIRone  15 mg Oral TID  . Chlorhexidine Gluconate Cloth  6 each Topical Daily  . clopidogrel  75 mg Oral q morning  . dextromethorphan-guaiFENesin  1 tablet Oral BID  . divalproex  250 mg Oral BID  . timolol  1 drop Both Eyes BID   And  . dorzolamide  1 drop Both Eyes BID  . enoxaparin (LOVENOX) injection  40 mg Subcutaneous Q24H  . gabapentin  200 mg Oral TID  . latanoprost  1 drop Both Eyes QHS  . pantoprazole  40 mg Oral Daily  . sodium chloride flush  10-40 mL Intracatheter Q12H  . sodium chloride flush  3 mL Intravenous Q12H    Objective: Vital signs in last 24 hours: Temp:  [98 F (36.7 C)-100.4 F (38 C)] 98.1 F (36.7 C) (05/16 0501) Pulse Rate:  [99-158] 110 (05/16 0924) Resp:  [15-22] 16 (05/16 0924) BP: (100-143)/(52-94) 133/94 (05/16 0924) SpO2:  [90 %-99 %] 93 % (05/16 0924) Physical Exam  Constitutional:  oriented to person, place, and time. appears well-developed and well-nourished. No distress.  HENT: [place]/AT, PERRLA, no scleral icterus Mouth/Throat: Oropharynx is clear and moist. No oropharyngeal exudate.  Cardiovascular: Normal rate, regular rhythm and normal heart sounds. Exam reveals no gallop and no friction rub.  No murmur heard.  Pulmonary/Chest: Effort normal and breath sounds normal. No respiratory distress. Occ. Expiratory wheezing Neck = supple, no nuchal rigidity Abdominal: Soft. Bowel sounds are normal.   exhibits no distension. There is no tenderness.  Lymphadenopathy: no cervical adenopathy. No axillary adenopathy Neurological: alert and oriented to person, place, and time.  Skin: Skin is warm and dry. No rash noted. No erythema.  Psychiatric: a normal mood and affect.  behavior is normal.   Lab Results Recent Labs    11/21/20 1050 11/22/20 0257  WBC 7.4 5.3  HGB 9.3* 7.8*  HCT 30.1* 24.9*  NA 138 142  K 4.8 4.5  CL 102 105  CO2 30 31  BUN 9 8  CREATININE 0.88 0.80   Liver Panel Recent Labs    11/21/20 1050  PROT 5.9*  ALBUMIN 2.3*  AST 11*  ALT 6  ALKPHOS 66  BILITOT 0.4   Sedimentation Rate Recent Labs    11/22/20 0257  ESRSEDRATE 108*   C-Reactive Protein Recent Labs    11/22/20 0257  CRP 16.5*    Microbiology: pending Studies/Results: CT Head Wo Contrast  Result Date: 11/21/2020 CLINICAL DATA:  Headache Intracranial hemorrhage suspected EXAM: CT HEAD WITHOUT CONTRAST TECHNIQUE: Contiguous axial images were obtained from the base of the skull through the vertex without intravenous contrast. COMPARISON:  09/01/2020 FINDINGS: Brain: No evidence of acute infarction, hemorrhage, hydrocephalus, extra-axial collection or mass lesion/mass effect. Periventricular white matter hypodensity is a nonspecific finding, but most commonly relates to chronic ischemic small vessel disease. Vascular: No hyperdense vessel or unexpected calcification.  Skull: Normal. Negative for fracture or focal lesion. Sinuses/Orbits: No acute finding. Other: None. IMPRESSION: No acute intracranial abnormality. Electronically Signed   By: Miachel Roux M.D.   On: 11/21/2020 14:26   CT Chest Wo Contrast  Result Date: 11/21/2020 CLINICAL DATA:  80 year old who is currently on IV antibiotics for MRSA infection in the lower extremities, found by the nursing home staff earlier today to be hypotensive, tachycardic and hypoxic with oxygen saturation of 82% on room air. Acute cough. Chest pain while  coughing. EXAM: CT CHEST WITHOUT CONTRAST TECHNIQUE: Multidetector CT imaging of the chest was performed following the standard protocol without IV contrast. COMPARISON:  CTA chest 10/25/2020 and earlier. FINDINGS: Cardiovascular: Stable normal heart size. Mild aortic valvular calcification. Moderate to severe three-vessel coronary atherosclerosis. No pericardial effusion. Moderate to severe atherosclerosis involving the thoracic and upper abdominal aorta without evidence of aneurysm. Atherosclerosis involving the proximal great vessels. Mediastinum/Nodes: No pathologically enlarged mediastinal, hilar or axillary lymph nodes. No mediastinal masses. Normal-appearing esophagus. Approximate 2.0 cm nodule involving the LEFT lobe of the thyroid gland, stable dating back to 2015, confirming benignity. Lungs/Pleura: Since the CT last month, development of peripheral airspace opacities throughout both lungs, predominantly in the upper lobes, and development of scattered BILATERAL lung nodules. A few of the peripheral opacities have central necrosis. Scar/atelectasis deep in the lower lobes bilaterally, unchanged. Central airways patent with severe bronchial wall thickening. Upper Abdomen: Visualized LEFT kidney atrophic. Mild pancreatic atrophy. Visualized upper abdomen otherwise unremarkable for the unenhanced technique. Opaque ingested material in the stomach. Musculoskeletal: Osseous demineralization. Degenerative disc disease and spondylosis throughout the thoracic spine and the upper lumbar spine. Upper thoracic dextroscoliosis and thoracolumbar levoscoliosis. IMPRESSION: 1. New peripheral airspace opacities throughout both lungs with [name] upper lobe predominance and new scattered BILATERAL lung nodule since the prior CT 1 month ago. Some of the peripheral opacities demonstrate central necrosis. The distribution and the time course suggests septic emboli may be the etiology. 2. Stable scar/atelectasis deep in the lower  lobes bilaterally. 3. 2.0 cm LEFT lobe thyroid nodule which has been stable dating back to at 18. Stability for greater than 5 years implies benignity; no biopsy or followup indicated. (Ref: J Am Coll Radiol. 2015 Feb;12(2): 143-50). Aortic Atherosclerosis (ICD10-I70.0). Electronically Signed   By: Evangeline Dakin M.D.   On: 11/21/2020 12:10   DG Chest Port 1 View  Result Date: 11/21/2020 CLINICAL DATA:  Questionable sepsis. EXAM: PORTABLE CHEST 1 VIEW COMPARISON:  10/23/2020 FINDINGS: Patient is a new RIGHT-sided PICC line, tip overlying the superior vena cava. Heart size is normal. Interval development of patchy opacity in the LEFT UPPER lobe and increased interstitial markings particularly at the bases. Remote cervical fusion. IMPRESSION: New LEFT UPPER lobe infiltrate and bibasilar atelectasis or developing LOWER lobe infiltrates. Electronically Signed   By: Nolon Nations M.D.   On: 11/21/2020 11:27    Assessment/Plan: Hx of MRSA complicated bacteremia = she was recently ruled out for endocarditis (with TEE negative on 4/25) to be treated as complicated bacteremia with 4 wk of daptomycin. Now readmitted for pneumonia, concerning for septic emboli from presumed right sided endocarditis. Continue on broad spectrum abtx. Will await blood cx, and please get sputum cultures. Recommend to pursue TEE again, even though < 3 wks. Still unusual. If blood cx are positive, her current picc line will need to be removed.  [facility] for Infectious Diseases Cell: [phone] Pager: [phone]  11/22/2020, 1:35 PM

## 2020-11-22 NOTE — Progress Notes (Signed)
PROGRESS NOTE  [name] [name] VEL:[id number] DOB: 1940-11-22 DOA: 11/21/2020 PCP: [name], MD  HPI/Recap of past 24 hours: HPI from Dr Loreli Dollar is a 80 y.o. female with medical history significant of HTN, HLD, PVD, PMR, and congenital solitary kidney presents with complaints of chest pain.  Patient just recently been hospitalized from 4/16-4/28 for MRSA bacteremia. During the hospitalization patient had underwent TEE which showed no signs of vegetation and ID recommended daptomycin to be continued for 4 weeks through PICC line. PTA, pt was awoken out of her sleep with coughing, left-sided pleuritic chest pain and SOB. Pt is tested weekly at the facility for COVID and was negative as of 6 days ago.  She also reports that she has had bilateral lower extremity wounds related with MRSA that have been healing. Found by staff to have O2 saturations of 86% and blood pressure 88/50 with initial pulse of 160. In the ED, patient was noted to be febrile up to 101.3 F, pulse 109-118, respiration 9-24, BP stable, and O2 saturations as low as 87% with improvement on 3 L nasal cannula oxygen up to 97%. Labs significant for hemoglobin 9.3, troponin 7, and lactic acid 1. Urinalysis did not give concern for infection.  Chest x-ray gave concern for possible infiltrates of the left upper and lower lung.  Sepsis protocol activated. Pt was discussed with ID, who recommended stopping daptomycin.  Patient was placed on empiric antibiotics of vancomycin and metronidazole, and cefepime.  TRH called to admit. Pt admitted for further management.    Today, patient still complaining of cough, some shortness of breath, denies any left-sided chest pain, abdominal pain, nausea/vomiting, fever/chills.    Assessment/Plan: Principal Problem:   Sepsis due to pneumonia [facility]) Active Problems:   Polymyalgia rheumatica ([place])   Peripheral vascular disease ([place])   Essential hypertension   Septic embolism ([place])   DNR (do  not resuscitate)   Sepsis 2/2 ?HCAP On presentation, fever up to 101 F, tachycardia, tachypnea Febrile, last temp 100.4 on 11/21/20, with no leukocytosis BC X 2 pending Procalcitonin negative Chest x-ray concerning for left-sided pneumonia Started on vancomycin, cefepime, and metronidazole Monitor closely  Acute hypoxic respiratory failure Requiring about 2L of O2, sat well Likely 2/2 above Management as above Continue supplemental O2  History of MRSA bacteremia ID recommended discontinuation of daptomycin Continue Vanc, cefepime, metronidazole   Septic emboli Recent CT chest concerning for septic emboli CT head (c/o headache) no intracranial abnormalities, if persistent, may need MRI Vs CT head with IV contrast BC X 2 pending TEE on 4/25 w/o evidence of vegetation Repeat TTE pending, may need TEE pending TTE As per ID Monitor closely  ?UTI UC grew 40,000 gram neg rods Continue antibiotics as above  B/L Lower extremity wounds Wound care consult ordered  Macrocytic anemia Hemoglobin baseline around 9, dropped to 7.8 ?hemodilution B12-->347, folate 9.8 Daily CBC  HTN BP stable  PVD Continue Plavix and asa  Congenital solitary kidney Kidney function appears to be near baseline at this time Daily BMP  PMR Patient seen by Dr. [name] of rheumatology on 5/9 and recommended to continue holding prednisone at this time and to follow back up on 12/13/2020 Continue outpatient follow-up    Estimated body mass index is 27.13 kg/m as calculated from the following:   Height as of this encounter: 5' (1.524 m).   Weight as of this encounter: 63 kg.     Code Status: DNR  Family Communication: None at  bedside  Disposition Plan: Status is: Inpatient  Remains inpatient appropriate because:Inpatient level of care appropriate due to severity of illness   Dispo: The patient is from: SNF              Anticipated d/c is to: SNF              Patient currently is not  medically stable to d/c.   Difficult to place patient No   Consultants:  ID  Procedures:  None  Antimicrobials: Vancomycin Cefepime Metronidazole  DVT prophylaxis: Lovenox   Objective: Vitals:   11/22/20 0048 11/22/20 0202 11/22/20 0501 11/22/20 0924  BP: (!) 108/57 (!) 118/53 140/67 (!) 133/94  Pulse: 99 (!) 106 (!) 101 (!) 110  Resp: 19 19 17 16   Temp: 98.2 F (36.8 C) 98 F (36.7 C) 98.1 F (36.7 C)   TempSrc: Oral Oral Oral   SpO2: 94% 92% 96% 93%  Weight:      Height:        Intake/Output Summary (Last 24 hours) at 11/22/2020 1220 Last data filed at 11/22/2020 0900 Gross per 24 hour  Intake 3330.16 ml  Output 2925 ml  Net 405.16 ml   Filed Weights   11/21/20 1031  Weight: 63 kg    Exam:  General: NAD, chronically ill appearing    Cardiovascular: S1, S2 present  Respiratory: Diminished BS bilaterally  Abdomen: Soft, nontender, nondistended, bowel sounds present  Musculoskeletal: BLE wrapped in ACE wrap, c/d/i. No bilateral pedal edema noted  Skin: Noted healing chronic wound  Psychiatry: Normal mood    Data Reviewed: CBC: Recent Labs  Lab 11/21/20 1050 11/22/20 0257  WBC 7.4 5.3  NEUTROABS 4.7  --   HGB 9.3* 7.8*  HCT 30.1* 24.9*  MCV 104.2* 105.1*  PLT 209 751   Basic Metabolic Panel: Recent Labs  Lab 11/21/20 1050 11/22/20 0257  NA 138 142  K 4.8 4.5  CL 102 105  CO2 30 31  GLUCOSE 107* 84  BUN 9 8  CREATININE 0.88 0.80  CALCIUM 8.7* 8.8*   GFR: Estimated Creatinine Clearance (by C-G formula based on SCr of 0.8 mg/dL) Female: 45.7 mL/min Female: 56.5 mL/min Liver Function Tests: Recent Labs  Lab 11/21/20 1050  AST 11*  ALT 6  ALKPHOS 66  BILITOT 0.4  PROT 5.9*  ALBUMIN 2.3*   No results for input(s): LIPASE, AMYLASE in the last 168 hours. No results for input(s): AMMONIA in the last 168 hours. Coagulation Profile: Recent Labs  Lab 11/21/20 1050  INR 1.1   Cardiac Enzymes: No results for input(s):  CKTOTAL, CKMB, CKMBINDEX, TROPONINI in the last 168 hours. BNP (last 3 results) No results for input(s): PROBNP in the last 8760 hours. HbA1C: No results for input(s): HGBA1C in the last 72 hours. CBG: No results for input(s): GLUCAP in the last 168 hours. Lipid Profile: No results for input(s): CHOL, HDL, LDLCALC, TRIG, CHOLHDL, LDLDIRECT in the last 72 hours. Thyroid Function Tests: No results for input(s): TSH, T4TOTAL, FREET4, T3FREE, THYROIDAB in the last 72 hours. Anemia Panel: Recent Labs    11/22/20 0257  VITAMINB12 347  FOLATE 9.8   Urine analysis:    Component Value Date/Time   COLORURINE YELLOW 11/21/2020 [place] 11/21/2020 1059   LABSPEC 1.010 11/21/2020 1059   PHURINE 6.0 11/21/2020 [place] 11/21/2020 [place] 11/21/2020 [place] 11/21/2020 1059   BILIRUBINUR negative 02/18/2020 [place]  NEG 06/08/2015 1447   KETONESUR NEGATIVE 11/21/2020 1059   PROTEINUR NEGATIVE 11/21/2020 1059   UROBILINOGEN 0.2 02/18/2020 0906   UROBILINOGEN 0.2 10/19/2014 1619   NITRITE NEGATIVE 11/21/2020 1059   LEUKOCYTESUR NEGATIVE 11/21/2020 1059   Sepsis Labs: @LABRCNTIP (procalcitonin:4,lacticidven:4)  ) Recent Results (from the past 240 hour(s))  Resp Panel by RT-PCR (Flu A&B, Covid) Nasopharyngeal Swab     Status: None   Collection Time: 11/21/20 10:55 AM   Specimen: Nasopharyngeal Swab; Nasopharyngeal(NP) swabs in vial transport medium  Result Value Ref Range Status   SARS Coronavirus 2 by RT PCR NEGATIVE NEGATIVE Final    Comment: (NOTE) SARS-CoV-2 target nucleic acids are NOT DETECTED.  The SARS-CoV-2 RNA is generally detectable in upper respiratory specimens during the acute phase of infection. The lowest concentration of SARS-CoV-2 viral copies this assay can detect is 138 copies/mL. A negative result does not preclude SARS-Cov-2 infection and should not be used as the sole basis for  treatment or other patient management decisions. A negative result may occur with  improper specimen collection/handling, submission of specimen other than nasopharyngeal swab, presence of viral mutation(s) within the areas targeted by this assay, and inadequate number of viral copies(<138 copies/mL). A negative result must be combined with clinical observations, patient history, and epidemiological information. The expected result is Negative.  Fact Sheet for Patients:  EntrepreneurPulse.com.au  Fact Sheet for Healthcare Providers:  IncredibleEmployment.be  This test is no t yet approved or cleared by the [place] FDA and  has been authorized for detection and/or diagnosis of SARS-CoV-2 by FDA under an Emergency Use Authorization (EUA). This EUA will remain  in effect (meaning this test can be used) for the duration of the COVID-19 declaration under Section 564(b)(1) of the Act, 21 U.S.C.section 360bbb-3(b)(1), unless the authorization is terminated  or revoked sooner.       Influenza A by PCR NEGATIVE NEGATIVE Final   Influenza B by PCR NEGATIVE NEGATIVE Final    Comment: (NOTE) The Xpert Xpress SARS-CoV-2/FLU/RSV plus assay is intended as an aid in the diagnosis of influenza from Nasopharyngeal swab specimens and should not be used as a sole basis for treatment. Nasal washings and aspirates are unacceptable for Xpert Xpress SARS-CoV-2/FLU/RSV testing.  Fact Sheet for Patients: EntrepreneurPulse.com.au  Fact Sheet for Healthcare Providers: IncredibleEmployment.be  This test is not yet approved or cleared by the [place] FDA and has been authorized for detection and/or diagnosis of SARS-CoV-2 by FDA under an Emergency Use Authorization (EUA). This EUA will remain in effect (meaning this test can be used) for the duration of the COVID-19 declaration under Section 564(b)(1) of the Act, 21  U.S.C. section 360bbb-3(b)(1), unless the authorization is terminated or revoked.  Performed at [facility], [place] [address] [place], [place] [address]   Urine culture     Status: Abnormal (Preliminary result)   Collection Time: 11/21/20 10:55 AM   Specimen: In/Out Cath Urine  Result Value Ref Range Status   Specimen Description IN/OUT CATH URINE  Final   Special Requests NONE  Final   Culture (A)  Final    40,000 COLONIES/mL GRAM NEGATIVE RODS SUSCEPTIBILITIES TO FOLLOW CULTURE REINCUBATED FOR BETTER GROWTH Performed at [facility], [place] [address], [place], [place] [address]    Report Status PENDING  Incomplete  MRSA PCR Screening     Status: None   Collection Time: 11/22/20 12:37 AM   Specimen: Nasal Mucosa; Nasopharyngeal  Result Value Ref Range Status   MRSA  by PCR NEGATIVE NEGATIVE Final    Comment:        The GeneXpert MRSA Assay (FDA approved for NASAL specimens only), is one component of a comprehensive MRSA colonization surveillance program. It is not intended to diagnose MRSA infection nor to guide or monitor treatment for MRSA infections. Performed at [facility], [place] [address] [place], [place] [address]       Studies: CT Head Wo Contrast  Result Date: 11/21/2020 CLINICAL DATA:  Headache Intracranial hemorrhage suspected EXAM: CT HEAD WITHOUT CONTRAST TECHNIQUE: Contiguous axial images were obtained from the base of the skull through the vertex without intravenous contrast. COMPARISON:  09/01/2020 FINDINGS: Brain: No evidence of acute infarction, hemorrhage, hydrocephalus, extra-axial collection or mass lesion/mass effect. Periventricular white matter hypodensity is a nonspecific finding, but most commonly relates to chronic ischemic small vessel disease. Vascular: No hyperdense vessel or unexpected calcification. Skull: Normal. Negative for fracture or focal lesion. Sinuses/Orbits: No acute finding. Other: None. IMPRESSION: No acute  intracranial abnormality. Electronically Signed   By: Miachel Roux M.D.   On: 11/21/2020 14:26    Scheduled Meds: . aspirin  81 mg Oral Daily  . busPIRone  15 mg Oral TID  . Chlorhexidine Gluconate Cloth  6 each Topical Daily  . clopidogrel  75 mg Oral q morning  . dextromethorphan-guaiFENesin  1 tablet Oral BID  . divalproex  250 mg Oral BID  . timolol  1 drop Both Eyes BID   And  . dorzolamide  1 drop Both Eyes BID  . enoxaparin (LOVENOX) injection  40 mg Subcutaneous Q24H  . gabapentin  200 mg Oral TID  . latanoprost  1 drop Both Eyes QHS  . pantoprazole  40 mg Oral Daily  . sodium chloride flush  10-40 mL Intracatheter Q12H  . sodium chloride flush  3 mL Intravenous Q12H    Continuous Infusions: . ceFEPime (MAXIPIME) IV 2 g (11/22/20 1212)  . lactated ringers 100 mL/hr at 11/22/20 0833  . metronidazole 500 mg (11/22/20 1208)  . vancomycin       LOS: 1 day     Alma Friendly, MD [facility]  If 7PM-7AM, please contact night-coverage [email or web address] 11/22/2020, 12:20 PM

## 2020-11-23 DIAGNOSIS — Z66 Do not resuscitate: Secondary | ICD-10-CM | POA: Diagnosis not present

## 2020-11-23 DIAGNOSIS — A419 Sepsis, unspecified organism: Secondary | ICD-10-CM | POA: Diagnosis not present

## 2020-11-23 DIAGNOSIS — I10 Essential (primary) hypertension: Secondary | ICD-10-CM | POA: Diagnosis not present

## 2020-11-23 DIAGNOSIS — J189 Pneumonia, unspecified organism: Secondary | ICD-10-CM | POA: Diagnosis not present

## 2020-11-23 LAB — BASIC METABOLIC PANEL
Anion gap: 4 — ABNORMAL LOW (ref 5–15)
BUN: 9 mg/dL (ref 8–23)
CO2: 34 mmol/L — ABNORMAL HIGH (ref 22–32)
Calcium: 9.1 mg/dL (ref 8.9–10.3)
Chloride: 103 mmol/L (ref 98–111)
Creatinine, Ser: 0.83 mg/dL (ref 0.44–1.00)
GFR, Estimated: >60 mL/min (ref >60)
Glucose, Bld: 83 mg/dL (ref 70–99)
Potassium: 4.4 mmol/L (ref 3.5–5.1)
Sodium: 141 mmol/L (ref 135–145)

## 2020-11-23 LAB — CBC WITH DIFFERENTIAL/PLATELET
Abs Immature Granulocytes: 0.04 10*3/uL (ref 0.00–0.07)
Basophils Absolute: 0 10*3/uL (ref 0.0–0.1)
Basophils Relative: 0 %
Eosinophils Absolute: 0.4 10*3/uL (ref 0.0–0.5)
Eosinophils Relative: 8 %
HCT: 26.3 % — ABNORMAL LOW (ref 36.0–46.0)
Hemoglobin: 8.2 g/dL — ABNORMAL LOW (ref 12.0–15.0)
Immature Granulocytes: 1 %
Lymphocytes Relative: 27 %
Lymphs Abs: 1.2 10*3/uL (ref 0.7–4.0)
MCH: 32.3 pg (ref 26.0–34.0)
MCHC: 31.2 g/dL (ref 30.0–36.0)
MCV: 103.5 fL — ABNORMAL HIGH (ref 80.0–100.0)
Monocytes Absolute: 0.6 10*3/uL (ref 0.1–1.0)
Monocytes Relative: 14 %
Neutro Abs: 2.2 10*3/uL (ref 1.7–7.7)
Neutrophils Relative %: 50 %
Platelets: 172 10*3/uL (ref 150–400)
RBC: 2.54 MIL/uL — ABNORMAL LOW (ref 3.87–5.11)
RDW: 15.1 % (ref 11.5–15.5)
WBC: 4.4 10*3/uL (ref 4.0–10.5)
nRBC: 0 % (ref 0.0–0.2)

## 2020-11-23 LAB — GLUCOSE, CAPILLARY
Glucose-Capillary: 98 mg/dL (ref 70–99)
Glucose-Capillary: 91 mg/dL (ref 70–99)
Glucose-Capillary: 117 mg/dL — ABNORMAL HIGH (ref 70–99)
Glucose-Capillary: 114 mg/dL — ABNORMAL HIGH (ref 70–99)

## 2020-11-23 LAB — URINE CULTURE: Culture: 40000 — AB

## 2020-11-23 NOTE — Progress Notes (Signed)
[facility]    Date of Admission:  11/21/2020   Total days of antibiotics 3           ID: [name] [name] is a 80 y.o. adult with  Hx of complicated mrsa bacteremia on 4/24, now admitted for worsening shortness of breath, pleurisy, found to have multifocal pneumonia with cavitary lesions concern for septic pulm emboli Principal Problem:   Sepsis due to pneumonia [facility]) Active Problems:   Polymyalgia rheumatica ([place])   Peripheral vascular disease ([place])   Essential hypertension   Septic embolism ([place])   DNR (do not resuscitate)    Subjective: Afebrile, has some leftside pleurisy  Medications:  . aspirin  81 mg Oral Daily  . busPIRone  15 mg Oral TID  . Chlorhexidine Gluconate Cloth  6 each Topical Daily  . clopidogrel  75 mg Oral q morning  . dextromethorphan-guaiFENesin  1 tablet Oral BID  . divalproex  250 mg Oral BID  . timolol  1 drop Both Eyes BID   And  . dorzolamide  1 drop Both Eyes BID  . enoxaparin (LOVENOX) injection  40 mg Subcutaneous Q24H  . gabapentin  200 mg Oral TID  . latanoprost  1 drop Both Eyes QHS  . pantoprazole  40 mg Oral Daily  . sodium chloride flush  10-40 mL Intracatheter Q12H  . sodium chloride flush  3 mL Intravenous Q12H    Objective: Vital signs in last 24 hours: Temp:  [98.3 F (36.8 C)-98.6 F (37 C)] 98.6 F (37 C) (05/17 0700) Pulse Rate:  [97-108] 97 (05/17 0700) Resp:  [16-17] 16 (05/17 0700) BP: (132-144)/(67-90) 144/75 (05/17 0700) SpO2:  [93 %-98 %] 98 % (05/17 0700) Physical Exam  Constitutional:  oriented to person, place, and time. appears well-developed and well-nourished. No distress.  HENT: [place]/AT, PERRLA, no scleral icterus Mouth/Throat: Oropharynx is clear and moist. No oropharyngeal exudate.  Cardiovascular: Normal rate, regular rhythm and normal heart sounds. Exam reveals no gallop and no friction rub.  No murmur heard.  Pulmonary/Chest: Effort normal and breath sounds normal. No  respiratory distress.  has no wheezes.  Neck = supple, no nuchal rigidity Abdominal: Soft. Bowel sounds are normal.  exhibits no distension. There is no tenderness.  Lymphadenopathy: no cervical adenopathy. No axillary adenopathy Neurological: alert and oriented to person, place, and time.  Skin: Skin is warm and dry. No rash noted. No erythema.  Psychiatric: a normal mood and affect.  behavior is normal.    Lab Results Recent Labs    11/22/20 0257 11/23/20 0317  WBC 5.3 4.4  HGB 7.8* 8.2*  HCT 24.9* 26.3*  NA 142 141  K 4.5 4.4  CL 105 103  CO2 31 34*  BUN 8 9  CREATININE 0.80 0.83   Liver Panel Recent Labs    11/21/20 1050  PROT 5.9*  ALBUMIN 2.3*  AST 11*  ALT 6  ALKPHOS 66  BILITOT 0.4   Sedimentation Rate Recent Labs    11/22/20 0257  ESRSEDRATE 108*   C-Reactive Protein Recent Labs    11/22/20 0257  CRP 16.5*    Microbiology: Blood cx ngtd Urine cx - 40,000 CFU of kleb pneumo and enterobacter cloacae Studies/Results: CT Head Wo Contrast  Result Date: 11/21/2020 CLINICAL DATA:  Headache Intracranial hemorrhage suspected EXAM: CT HEAD WITHOUT CONTRAST TECHNIQUE: Contiguous axial images were obtained from the base of the skull through the vertex without intravenous contrast. COMPARISON:  09/01/2020 FINDINGS: Brain: No  evidence of acute infarction, hemorrhage, hydrocephalus, extra-axial collection or mass lesion/mass effect. Periventricular white matter hypodensity is a nonspecific finding, but most commonly relates to chronic ischemic small vessel disease. Vascular: No hyperdense vessel or unexpected calcification. Skull: Normal. Negative for fracture or focal lesion. Sinuses/Orbits: No acute finding. Other: None. IMPRESSION: No acute intracranial abnormality. Electronically Signed   By: Miachel Roux M.D.   On: 11/21/2020 14:26   ECHOCARDIOGRAM COMPLETE  Result Date: 11/22/2020    ECHOCARDIOGRAM REPORT   Patient Name:   [name] [name] Date of Exam: 11/22/2020  Medical Rec #:  [id number]   Height:       60.0 in Accession #:    [id number]  Weight:       138.9 lb Date of Birth:  [birth year]   BSA:          1.599 m Patient Age:    62 years    BP:           140/67 mmHg Patient Gender: F           HR:           107 bpm. Exam Location:  Inpatient Procedure: 2D Echo, Cardiac Doppler and Color Doppler Indications:    Endocarditis I38  History:        Patient has prior history of Echocardiogram examinations, most                 recent 11/01/2020. Signs/Symptoms:Murmur; Risk                 Factors:Hypertension and Sleep Apnea. GERD. CKD.  Sonographer:    Jonelle Sidle Dance Referring Phys: Sharon Welsch  1. Current study is inadequate to evaluate for endocarditis. Recomm TEE if clinically indicated.  2. Left ventricular ejection fraction, by estimation, is 70 to 75%. The left ventricle has hyperdynamic function. The left ventricle has no regional wall motion abnormalities. Indeterminate diastolic filling due to E-A fusion.  3. Right ventricular systolic function is low normal. The right ventricular size is normal.  4. The mitral valve is normal in structure. Mild mitral valve regurgitation.  5. The aortic valve was not well visualized. Aortic valve regurgitation is not visualized. Mild to moderate aortic valve sclerosis/calcification is present, without any evidence of aortic stenosis.  6. The inferior vena cava is normal in size with greater than 50% respiratory variability, suggesting right atrial pressure of 3 mmHg. Comparison(s): The left ventricular function is unchanged. FINDINGS  Left Ventricle: Left ventricular ejection fraction, by estimation, is 70 to 75%. The left ventricle has hyperdynamic function. The left ventricle has no regional wall motion abnormalities. 3D left ventricular ejection fraction analysis performed but not  reported based on interpreter judgement due to suboptimal quality. The left ventricular internal cavity size was normal in size.  There is no left ventricular hypertrophy. Indeterminate diastolic filling due to E-A fusion. Right Ventricle: The right ventricular size is normal. Right vetricular wall thickness was not assessed. Right ventricular systolic function is low normal. Left Atrium: Left atrial size was normal in size. Right Atrium: Right atrial size was normal in size. Pericardium: Trivial pericardial effusion is present. Mitral Valve: The mitral valve is normal in structure. Mild mitral valve regurgitation. Tricuspid Valve: The tricuspid valve is normal in structure. Tricuspid valve regurgitation is mild. Aortic Valve: The aortic valve was not well visualized. Aortic valve regurgitation is not visualized. Mild to moderate aortic valve sclerosis/calcification is present, without any evidence of aortic stenosis. Pulmonic  Valve: The pulmonic valve was not well visualized. Pulmonic valve regurgitation is not visualized. Aorta: The aortic root and ascending aorta are structurally normal, with no evidence of dilitation. Venous: The inferior vena cava is normal in size with greater than 50% respiratory variability, suggesting right atrial pressure of 3 mmHg. IAS/Shunts: No atrial level shunt detected by color flow Doppler.  LEFT VENTRICLE PLAX 2D LVIDd:         3.60 cm  Diastology LVIDs:         3.00 cm  LV e' medial:    8.59 cm/s LV PW:         0.80 cm  LV E/e' medial:  11.9 LV IVS:        1.00 cm  LV e' lateral:   7.29 cm/s LVOT diam:     2.10 cm  LV E/e' lateral: 14.0 LV SV:         59 LV SV Index:   37 LVOT Area:     3.46 cm  RIGHT VENTRICLE             IVC RV Basal diam:  2.20 cm     IVC diam: 1.90 cm RV S prime:     17.40 cm/s TAPSE (M-mode): 1.9 cm LEFT ATRIUM             Index       RIGHT ATRIUM          Index LA diam:        3.20 cm 2.00 cm/m  RA Area:     7.53 cm LA Vol (A2C):   48.3 ml 30.21 ml/m RA Volume:   11.30 ml 7.07 ml/m LA Vol (A4C):   16.4 ml 10.26 ml/m LA Biplane Vol: 28.1 ml 17.58 ml/m  AORTIC VALVE LVOT Vmax:    87.70 cm/s LVOT Vmean:  58.100 cm/s LVOT VTI:    0.169 m  AORTA Ao Root diam: 3.00 cm Ao Asc diam:  3.10 cm MITRAL VALVE MV Area (PHT): 3.08 cm     SHUNTS MV Decel Time: 246 msec     Systemic VTI:  0.17 m MV E velocity: 102.00 cm/s  Systemic Diam: 2.10 cm MV A velocity: 151.00 cm/s MV E/A ratio:  0.68 Dorris Carnes MD Electronically signed by Dorris Carnes MD Signature Date/Time: 11/22/2020/3:26:14 PM    Final      Assessment/Plan: Multi-focal pneumonia concerning for septic pulmonary emboli = given that she had MRSA bacteremia roughly 3 weeks ago, we will continue with vancomycin as this would be the most likely source. Can discontinue other antibiotics.  Recommend TEE for evaluation of right sided endocarditis.  +urine cx = she has asymptomatic bacturia. Recommend to not treat    [facility] Cell: [phone] Pager: [phone]  11/23/2020, 1:09 PM

## 2020-11-23 NOTE — Evaluation (Signed)
Physical Therapy Evaluation Patient Details Name: [name] [name] MRN: [id number] DOB: 08-22-1940 Today's Date: 11/23/2020   History of Present Illness  Pt is an 80 yo female admitted from [facility] with  sepsis from pnemonia and chest pain.  Pt  was admitted to [facility] in Feb and April with similar symptoms and went to SNF after most recent admit. 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 Pt lives alone.  Clinical Impression  Pt admitted with above diagnosis. Pt was able to sit EOB and do some exercises.  Sit to stand with assist with pt reporting bil LEs "give out frequently".  Pt needs continued therapy at SNF.  Will follow acutely.  Pt currently with functional limitations due to the deficits listed below (see PT Problem List). Pt will benefit from skilled PT to increase their independence and safety with mobility to allow discharge to the venue listed below.      Follow Up Recommendations SNF    Equipment Recommendations  None recommended by PT    Recommendations for Other Services       Precautions / Restrictions Precautions Precautions: Fall (pressure wounds on bottom and BLEs.) Precaution Comments: monitor O2 Restrictions Weight Bearing Restrictions: No Other Position/Activity Restrictions: Pt has pain with weightbearing in BLEs.      Mobility  Bed Mobility Overal bed mobility: Needs Assistance Bed Mobility: Supine to Sit     Supine to sit: Mod assist Sit to supine: Mod assist   General bed mobility comments: Assist to get into full sitting and to scoot self to EOB to get feet on the floor.    Transfers Overall transfer level: Needs assistance Equipment used: 1 person hand held assist Transfers: Sit to/from [facility] Sit to Stand: Min assist Stand pivot transfers: Mod assist       General transfer comment: MinA for sit to stand.  States  feet feel like pins and needles and cannot walk today.  Ambulation/Gait                Stairs            Wheelchair Mobility    Modified Rankin (Stroke Patients Only)       Balance Overall balance assessment: Needs assistance Sitting-balance support: Feet supported;Single extremity supported Sitting balance-Leahy Scale: Fair     Standing balance support: Bilateral upper extremity supported;During functional activity Standing balance-Leahy Scale: Poor Standing balance comment: Heavy reliance on outside support                             Pertinent Vitals/Pain Pain Assessment: Faces Faces Pain Scale: Hurts even more Pain Location: BLEs with any movement and weight bearing. Pain Descriptors / Indicators: Grimacing;Tender Pain Intervention(s): Limited activity within patient's tolerance;Monitored during session;Repositioned    Home Living Family/patient expects to be discharged to:: Skilled nursing facility Living Arrangements: Alone Available Help at Discharge: Family;Available PRN/intermittently Type of Home: House Home Access: Ramped entrance Entrance Stairs-Rails: Right Entrance Stairs-Number of Steps: 5 Home Layout: One level Home Equipment: Walker - 2 wheels;Walker - 4 wheels;Shower seat;Wheelchair - manual;Bedside commode Additional Comments: Pt lived alone. Grandson assisted with driving and food. Pt stated she could do all her adls and manage in her home prior to April.    Prior Function Level of Independence: Needs assistance   Gait / Transfers Assistance Needed: Pt has been in  SNF for last two weeks and has not ambulated or done any of her self care.  Prior to April, pt had HHPT, could walk with rollator and completed basic adls by herself.  ADL's / Homemaking Assistance Needed: Dependent at this time. Prior to April had some assist bathing and with meds but otherwise mod I.  Comments: Pt states she has been at [facility] for 2 weeks doing  therapy prior to this admit. Unsure of accuracy as pt poor historian     Hand Dominance   Dominant Hand: Right    Extremity/Trunk Assessment   Upper Extremity Assessment Upper Extremity Assessment: Defer to OT evaluation RUE Deficits / Details: Tremors noted. ROM/Strength WFL RUE Sensation: WNL RUE Coordination: WNL LUE Deficits / Details: tremors noted. ROM WFL. Strength 4-/5. pt states this side hurts due to arthritis. LUE: Unable to fully assess due to pain LUE Sensation: WNL LUE Coordination: decreased gross motor    Lower Extremity Assessment Lower Extremity Assessment: Generalized weakness;RLE deficits/detail;LLE deficits/detail RLE Sensation: history of peripheral neuropathy LLE Sensation: history of peripheral neuropathy    Cervical / Trunk Assessment Cervical / Trunk Assessment: Kyphotic  Communication   Communication: No difficulties  Cognition Arousal/Alertness: Awake/alert Behavior During Therapy: WFL for tasks assessed/performed Overall Cognitive Status: No family/caregiver present to determine baseline cognitive functioning Area of Impairment: Problem solving;Memory                     Memory: Decreased short-term memory       Problem Solving: Slow processing General Comments: Pt states one daughter does not ever visit. Other daughter lives near her but works all the time. Has a grandson that checks on her and helps.  She tells me that her long term plan after rehab is to go live with her grandson      General Comments General comments (skin integrity, edema, etc.): Wrapped wounds bil LEs, VSS on 2LO2    Exercises General Exercises - Lower Extremity Ankle Circles/Pumps: AROM;Both;10 reps;Supine Quad Sets: AROM;10 reps Gluteal Sets: AROM;10 reps Long Arc Quad: AROM;Both;Seated;10 reps   Assessment/Plan    PT Assessment Patient needs continued PT services  PT Problem List Decreased strength;Decreased mobility;Decreased safety  awareness;Decreased activity tolerance;Cardiopulmonary status limiting activity;Decreased balance;Decreased cognition       PT Treatment Interventions Therapeutic activities;Gait training;Therapeutic exercise;Patient/family education;Balance training;Stair training;Functional mobility training;DME instruction;Neuromuscular re-education    PT Goals (Current goals can be found in the Care Plan section)  Acute Rehab PT Goals Patient Stated Goal: hurt less and go home PT Goal Formulation: With patient Time For Goal Achievement: 12/07/20 Potential to Achieve Goals: Good    Frequency Min 2X/week   Barriers to discharge        Co-evaluation               AM-PAC PT "6 Clicks" Mobility  Outcome Measure Help needed turning from your back to your side while in a flat bed without using bedrails?: A Lot Help needed moving from lying on your back to sitting on the side of a flat bed without using bedrails?: A Lot Help needed moving to and from a bed to a chair (including a wheelchair)?: A Lot Help needed standing up from a chair using your arms (e.g., wheelchair or bedside chair)?: A Little Help needed to walk in hospital room?: A Lot Help needed climbing 3-5 steps with a railing? : Total 6 Click Score: 12    End of Session Equipment Utilized During Treatment: Gait belt;Oxygen  Activity Tolerance: Patient limited by fatigue Patient left: with call bell/phone within reach;in chair;with chair alarm set Nurse Communication: Mobility status PT Visit Diagnosis: Unsteadiness on feet (R26.81);Muscle weakness (generalized) (M62.81);Difficulty in walking, not elsewhere classified (R26.2)    Time: 5462-7035 PT Time Calculation (min) (ACUTE ONLY): 28 min   Charges:   PT Evaluation $PT Eval Moderate Complexity: 1 Mod PT Treatments $Therapeutic Activity: 8-22 mins        Doy Taaffe M,PT Acute Rehab Services [phone] (pager)  Alvira Philips 11/23/2020, 11:33 AM

## 2020-11-23 NOTE — Progress Notes (Signed)
PROGRESS NOTE  [name] [name]:[id number] DOB: 1941-04-27 DOA: 11/21/2020 PCP: [name], MD  HPI/Recap of past 24 hours: HPI from Dr Loreli Dollar is a 80 y.o. female with medical history significant of HTN, HLD, PVD, PMR, and congenital solitary kidney presents with complaints of chest pain.  Patient just recently been hospitalized from 4/16-4/28 for MRSA bacteremia. During the hospitalization patient had underwent TEE which showed no signs of vegetation and ID recommended daptomycin to be continued for 4 weeks through PICC line. PTA, pt was awoken out of her sleep with coughing, left-sided pleuritic chest pain and SOB. Pt is tested weekly at the facility for COVID and was negative as of 6 days ago.  She also reports that she has had bilateral lower extremity wounds related with MRSA that have been healing. Found by staff to have O2 saturations of 86% and blood pressure 88/50 with initial pulse of 160. In the ED, patient was noted to be febrile up to 101.3 F, pulse 109-118, respiration 9-24, BP stable, and O2 saturations as low as 87% with improvement on 3 L nasal cannula oxygen up to 97%. Labs significant for hemoglobin 9.3, troponin 7, and lactic acid 1. Urinalysis did not give concern for infection.  Chest x-ray gave concern for possible infiltrates of the left upper and lower lung.  Sepsis protocol activated. Pt was discussed with ID, who recommended stopping daptomycin.  Patient was placed on empiric antibiotics of vancomycin and metronidazole, and cefepime.  TRH called to admit. Pt admitted for further management.     Today, patient reporting feeling weak overall, denies any chest pain, worsening SOB or cough, denies any abdominal pain, fever/chills.      Assessment/Plan: Principal Problem:   Sepsis due to pneumonia [facility]) Active Problems:   Polymyalgia rheumatica ([place])   Peripheral vascular disease ([place])   Essential hypertension   Septic embolism ([place])   DNR (do not  resuscitate)   Sepsis 2/2 ?HCAP On presentation, fever up to 101 F, tachycardia, tachypnea Currently afebrile, with no leukocytosis BC X 2 NGTD Procalcitonin negative Chest x-ray concerning for left-sided pneumonia Continue vancomycin only as per ID, Dr Baxter Flattery, d/c cefepime, and metronidazole  Monitor closely  Acute hypoxic respiratory failure Requiring about 2L of O2, sat well, plan to wean off Likely 2/2 above Management as above Continue supplemental O2  History of MRSA bacteremia ID recommended discontinuation of daptomycin and switching to Vancomycin  Continue Vancomycin  Septic pulmonary emboli Recent CT chest concerning for septic emboli CT head (c/o headache) no intracranial abnormalities, if persistent, may need MRI Vs CT head with IV contrast BC X 2 NGTD TEE on 4/25 w/o evidence of vegetation, repeat TEE as rec by ID to evaluate for R sided endocarditis Cardiology consulted for TEE, scheduled for 11/25/20 Monitor closely  ?UTI UC grew 40,000 enterobacter and 40, 000 Klebsiella, asymptomatic ID recommending not to treat as pt asymptomatic  B/L Lower extremity wounds Wound care consult ordered Continue antibiotics  Macrocytic anemia Hemoglobin baseline around 9 B12-->347, folate 9.8 Daily CBC  HTN BP stable  PVD Continue Plavix and asa  Congenital solitary kidney Kidney function appears to be stable Daily BMP  PMR Patient seen by Dr. [name] of rheumatology on 5/9 and recommended to continue holding prednisone at this time and to follow up on 12/13/2020 Continue outpatient follow-up    Estimated body mass index is 27.13 kg/m as calculated from the following:   Height as of this encounter: 5' (1.524 m).  Weight as of this encounter: 63 kg.     Code Status: DNR  Family Communication: None at bedside  Disposition Plan: Status is: Inpatient  Remains inpatient appropriate because:Inpatient level of care appropriate due to severity of  illness   Dispo: The patient is from: SNF              Anticipated d/c is to: SNF              Patient currently is not medically stable to d/c.   Difficult to place patient No   Consultants:  ID  Procedures:  None  Antimicrobials: Vancomycin  DVT prophylaxis: Lovenox   Objective: Vitals:   11/22/20 0924 11/22/20 1700 11/22/20 2106 11/23/20 0700  BP: (!) 133/94 132/90 139/67 (!) 144/75  Pulse: (!) 110 (!) 108 98 97  Resp: 16 17 16 16   Temp: 98.2 F (36.8 C) 98.3 F (36.8 C) 98.3 F (36.8 C) 98.6 F (37 C)  TempSrc: Oral Oral Oral Oral  SpO2: 93% 94% 93% 98%  Weight:      Height:        Intake/Output Summary (Last 24 hours) at 11/23/2020 1449 Last data filed at 11/23/2020 1226 Gross per 24 hour  Intake 660 ml  Output 3500 ml  Net -2840 ml   Filed Weights   11/21/20 1031  Weight: 63 kg    Exam:  General: NAD, chronically ill appearing    Cardiovascular: S1, S2 present  Respiratory: Diminished BS bilaterally  Abdomen: Soft, nontender, nondistended, bowel sounds present  Musculoskeletal: BLE wrapped in ACE wrap, c/d/i. No bilateral pedal edema noted  Skin: Noted healing chronic wound  Psychiatry: Normal mood    Data Reviewed: CBC: Recent Labs  Lab 11/21/20 1050 11/22/20 0257 11/23/20 0317  WBC 7.4 5.3 4.4  NEUTROABS 4.7  --  2.2  HGB 9.3* 7.8* 8.2*  HCT 30.1* 24.9* 26.3*  MCV 104.2* 105.1* 103.5*  PLT 209 170 283   Basic Metabolic Panel: Recent Labs  Lab 11/21/20 1050 11/22/20 0257 11/23/20 0317  NA 138 142 141  K 4.8 4.5 4.4  CL 102 105 103  CO2 30 31 34*  GLUCOSE 107* 84 83  BUN 9 8 9   CREATININE 0.88 0.80 0.83  CALCIUM 8.7* 8.8* 9.1   GFR: Estimated Creatinine Clearance (by C-G formula based on SCr of 0.83 mg/dL) Female: 44.1 mL/min Female: 54.5 mL/min Liver Function Tests: Recent Labs  Lab 11/21/20 1050  AST 11*  ALT 6  ALKPHOS 66  BILITOT 0.4  PROT 5.9*  ALBUMIN 2.3*   No results for input(s): LIPASE,  AMYLASE in the last 168 hours. No results for input(s): AMMONIA in the last 168 hours. Coagulation Profile: Recent Labs  Lab 11/21/20 1050  INR 1.1   Cardiac Enzymes: No results for input(s): CKTOTAL, CKMB, CKMBINDEX, TROPONINI in the last 168 hours. BNP (last 3 results) No results for input(s): PROBNP in the last 8760 hours. HbA1C: No results for input(s): HGBA1C in the last 72 hours. CBG: Recent Labs  Lab 11/23/20 0850 11/23/20 1127  GLUCAP 98 91   Lipid Profile: No results for input(s): CHOL, HDL, LDLCALC, TRIG, CHOLHDL, LDLDIRECT in the last 72 hours. Thyroid Function Tests: No results for input(s): TSH, T4TOTAL, FREET4, T3FREE, THYROIDAB in the last 72 hours. Anemia Panel: Recent Labs    11/22/20 0257  VITAMINB12 347  FOLATE 9.8   Urine analysis:    Component Value Date/Time   COLORURINE YELLOW 11/21/2020 [name] 11/21/2020  1059   LABSPEC 1.010 11/21/2020 1059   PHURINE 6.0 11/21/2020 1059   GLUCOSEU NEGATIVE 11/21/2020 1059   HGBUR NEGATIVE 11/21/2020 [place] 11/21/2020 1059   BILIRUBINUR negative 02/18/2020 0906   BILIRUBINUR NEG 06/08/2015 [place] 11/21/2020 1059   PROTEINUR NEGATIVE 11/21/2020 1059   UROBILINOGEN 0.2 02/18/2020 0906   UROBILINOGEN 0.2 10/19/2014 1619   NITRITE NEGATIVE 11/21/2020 1059   LEUKOCYTESUR NEGATIVE 11/21/2020 1059   Sepsis Labs: @LABRCNTIP (procalcitonin:4,lacticidven:4)  ) Recent Results (from the past 240 hour(s))  Resp Panel by RT-PCR (Flu A&B, Covid) Nasopharyngeal Swab     Status: None   Collection Time: 11/21/20 10:55 AM   Specimen: Nasopharyngeal Swab; Nasopharyngeal(NP) swabs in vial transport medium  Result Value Ref Range Status   SARS Coronavirus 2 by RT PCR NEGATIVE NEGATIVE Final    Comment: (NOTE) SARS-CoV-2 target nucleic acids are NOT DETECTED.  The SARS-CoV-2 RNA is generally detectable in upper respiratory specimens during the acute phase of  infection. The lowest concentration of SARS-CoV-2 viral copies this assay can detect is 138 copies/mL. A negative result does not preclude SARS-Cov-2 infection and should not be used as the sole basis for treatment or other patient management decisions. A negative result may occur with  improper specimen collection/handling, submission of specimen other than nasopharyngeal swab, presence of viral mutation(s) within the areas targeted by this assay, and inadequate number of viral copies(<138 copies/mL). A negative result must be combined with clinical observations, patient history, and epidemiological information. The expected result is Negative.  Fact Sheet for Patients:  EntrepreneurPulse.com.au  Fact Sheet for Healthcare Providers:  IncredibleEmployment.be  This test is no t yet approved or cleared by the [place] FDA and  has been authorized for detection and/or diagnosis of SARS-CoV-2 by FDA under an Emergency Use Authorization (EUA). This EUA will remain  in effect (meaning this test can be used) for the duration of the COVID-19 declaration under Section 564(b)(1) of the Act, 21 U.S.C.section 360bbb-3(b)(1), unless the authorization is terminated  or revoked sooner.       Influenza A by PCR NEGATIVE NEGATIVE Final   Influenza B by PCR NEGATIVE NEGATIVE Final    Comment: (NOTE) The Xpert Xpress SARS-CoV-2/FLU/RSV plus assay is intended as an aid in the diagnosis of influenza from Nasopharyngeal swab specimens and should not be used as a sole basis for treatment. Nasal washings and aspirates are unacceptable for Xpert Xpress SARS-CoV-2/FLU/RSV testing.  Fact Sheet for Patients: EntrepreneurPulse.com.au  Fact Sheet for Healthcare Providers: IncredibleEmployment.be  This test is not yet approved or cleared by the [place] FDA and has been authorized for detection and/or diagnosis of SARS-CoV-2  by FDA under an Emergency Use Authorization (EUA). This EUA will remain in effect (meaning this test can be used) for the duration of the COVID-19 declaration under Section 564(b)(1) of the Act, 21 U.S.C. section 360bbb-3(b)(1), unless the authorization is terminated or revoked.  Performed at [facility], [place] [address] [place], [place] [address]   Blood Culture (routine x 2)     Status: None (Preliminary result)   Collection Time: 11/21/20 10:55 AM   Specimen: BLOOD RIGHT ARM  Result Value Ref Range Status   Specimen Description BLOOD RIGHT ARM  Final   Special Requests   Final    BOTTLES DRAWN AEROBIC AND ANAEROBIC Blood Culture adequate volume   Culture   Final    NO GROWTH 2 DAYS Performed at [facility], [address] [place], [place] [address]    Report Status PENDING  Incomplete  Urine culture     Status: Abnormal   Collection Time: 11/21/20 10:55 AM   Specimen: In/Out Cath Urine  Result Value Ref Range Status   Specimen Description IN/OUT CATH URINE  Final   Special Requests   Final    NONE Performed at [facility], [place] [address], [place], [place] [address]    Culture (A)  Final    40,000 COLONIES/mL KLEBSIELLA PNEUMONIAE 40,000 COLONIES/mL ENTEROBACTER CLOACAE    Report Status 11/23/2020 FINAL  Final   Organism ID, Bacteria KLEBSIELLA PNEUMONIAE (A)  Final   Organism ID, Bacteria ENTEROBACTER CLOACAE (A)  Final      Susceptibility   Enterobacter cloacae - MIC*    CEFAZOLIN >=64 RESISTANT Resistant     CEFEPIME <=0.12 SENSITIVE Sensitive     CIPROFLOXACIN <=0.25 SENSITIVE Sensitive     GENTAMICIN <=1 SENSITIVE Sensitive     IMIPENEM <=0.25 SENSITIVE Sensitive     NITROFURANTOIN 64 INTERMEDIATE Intermediate     TRIMETH/SULFA <=20 SENSITIVE Sensitive     PIP/TAZO <=4 SENSITIVE Sensitive     * 40,000 COLONIES/mL ENTEROBACTER CLOACAE   Klebsiella pneumoniae - MIC*    AMPICILLIN >=32 RESISTANT Resistant     CEFAZOLIN <=4 SENSITIVE  Sensitive     CEFEPIME <=0.12 SENSITIVE Sensitive     CEFTRIAXONE <=0.25 SENSITIVE Sensitive     CIPROFLOXACIN <=0.25 SENSITIVE Sensitive     GENTAMICIN <=1 SENSITIVE Sensitive     IMIPENEM <=0.25 SENSITIVE Sensitive     NITROFURANTOIN 128 RESISTANT Resistant     TRIMETH/SULFA <=20 SENSITIVE Sensitive     AMPICILLIN/SULBACTAM 8 SENSITIVE Sensitive     PIP/TAZO <=4 SENSITIVE Sensitive     * 40,000 COLONIES/mL KLEBSIELLA PNEUMONIAE  Blood Culture (routine x 2)     Status: None (Preliminary result)   Collection Time: 11/21/20 11:00 AM   Specimen: BLOOD LEFT FOREARM  Result Value Ref Range Status   Specimen Description BLOOD LEFT FOREARM  Final   Special Requests   Final    BOTTLES DRAWN AEROBIC AND ANAEROBIC Blood Culture adequate volume   Culture   Final    NO GROWTH 2 DAYS Performed at [facility], [address], [place], [place] [address]    Report Status PENDING  Incomplete  MRSA PCR Screening     Status: None   Collection Time: 11/22/20 12:37 AM   Specimen: Nasal Mucosa; Nasopharyngeal  Result Value Ref Range Status   MRSA by PCR NEGATIVE NEGATIVE Final    Comment:        The GeneXpert MRSA Assay (FDA approved for NASAL specimens only), is one component of a comprehensive MRSA colonization surveillance program. It is not intended to diagnose MRSA infection nor to guide or monitor treatment for MRSA infections. Performed at [facility], [place] [address] [place], [place] [address]       Studies: No results found.  Scheduled Meds: . aspirin  81 mg Oral Daily  . busPIRone  15 mg Oral TID  . Chlorhexidine Gluconate Cloth  6 each Topical Daily  . clopidogrel  75 mg Oral q morning  . dextromethorphan-guaiFENesin  1 tablet Oral BID  . divalproex  250 mg Oral BID  . timolol  1 drop Both Eyes BID   And  . dorzolamide  1 drop Both Eyes BID  . enoxaparin (LOVENOX) injection  40 mg Subcutaneous Q24H  . gabapentin  200 mg Oral TID  . latanoprost  1 drop  Both Eyes QHS  . pantoprazole  40 mg Oral Daily  . sodium chloride flush  10-40 mL Intracatheter Q12H  . sodium chloride flush  3 mL Intravenous Q12H    Continuous Infusions: . ceFEPime (MAXIPIME) IV 2 g (11/23/20 1122)  . metronidazole 500 mg (11/23/20 1233)  . vancomycin 500 mg (11/23/20 1357)     LOS: 2 days     Alma Friendly, MD [facility]  If 7PM-7AM, please contact night-coverage [email or web address] 11/23/2020, 2:49 PM

## 2020-11-23 NOTE — Evaluation (Signed)
Occupational Therapy Evaluation Patient Details Name: [name] [name] MRN: [id number] DOB: 1941-06-25 Today's Date: 11/23/2020    History of Present Illness Pt is an 80 yo female admitted from [facility] with  sepsis from pnemonia and chest pain.  Pt  was admitted to [facility] in Feb and April with similar symptoms and went to SNF after most recent admit. 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 Pt lives alone.   Clinical Impression   Pt admitted with the above diagnosis and has the deficits listed below. Pt would benefit from cont OT to increase independence and safety with basic adls and adl transfers so she can eventually return home. Pt came from a SNF and will need to return to SNF rehab as she lives alone and continues to require significant assist with her adls and adl transfers. Pt states she does not have great assist at home as the daughter that lives close works a lot but her grandson is very helpful.  Pt is not at a level where she will be safe at home.  Will continue with a focus of mobilization during adls.    Follow Up Recommendations  SNF;Supervision/Assistance - 24 hour    Equipment Recommendations  Other (comment) (tbd)    Recommendations for Other Services       Precautions / Restrictions Precautions Precautions: Fall (pressure wounds on bottom and BLEs.) Precaution Comments: monitor O2 Restrictions Weight Bearing Restrictions: No Other Position/Activity Restrictions: Pt has pain with weightbearing in BLEs.      Mobility Bed Mobility Overal bed mobility: Needs Assistance Bed Mobility: Supine to Sit     Supine to sit: Mod assist     General bed mobility comments: Assist to get into full sitting and to scoot self to EOB to get feet on the floor.    Transfers Overall transfer level: Needs assistance Equipment used: 1 person hand held assist Transfers: Sit  to/from [facility] Sit to Stand: Min assist Stand pivot transfers: Mod assist       General transfer comment: MinA for sit to stand and taking a few steps from bed to recliner with RW; pt stood x1 min for pericare.    Balance Overall balance assessment: Needs assistance Sitting-balance support: Feet supported;Single extremity supported Sitting balance-Leahy Scale: Fair     Standing balance support: Bilateral upper extremity supported;During functional activity Standing balance-Leahy Scale: Poor Standing balance comment: Heavily reliant on outside support                           ADL either performed or assessed with clinical judgement   ADL Overall ADL's : Needs assistance/impaired Eating/Feeding: Set up;Sitting   Grooming: Sitting;Wash/dry face;Wash/dry hands;Brushing hair;Minimal assistance Grooming Details (indicate cue type and reason): MinA overall for brush back of hair and place ina  pony tail Upper Body Bathing: Minimal assistance;Sitting   Lower Body Bathing: Maximal assistance;Sitting/lateral leans;Sit to/from stand Lower Body Bathing Details (indicate cue type and reason): standing for pericare/LB bathing Upper Body Dressing : Min guard;Sitting   Lower Body Dressing: Moderate assistance;Sitting/lateral leans;Sit to/from stand;Cueing for safety   Toilet Transfer: Minimal assistance;Stand-pivot;BSC Toilet Transfer Details (indicate cue type and reason): pivot to [facility]. Cues to use BUEs to assist instead of pulling up on therapist. Toileting- Clothing Manipulation and Hygiene: Maximal assistance;Sit to/from stand Toileting - Clothing Manipulation Details (indicate cue type and reason): Pt sat  on toilet while therapist cleaned pt,.     Functional mobility during ADLs: Minimal assistance General ADL Comments: Pt very limited by pain in BLEs limiting her ability to stand.  Pt very weak.     Vision Baseline Vision/History: No visual  deficits Patient Visual Report: No change from baseline Vision Assessment?: No apparent visual deficits     Perception Perception Perception Tested?: No   Praxis Praxis Praxis tested?: Within functional limits    Pertinent Vitals/Pain Pain Assessment: Faces Faces Pain Scale: Hurts even more Pain Location: BLEs with any movement and weight bearing. Pain Descriptors / Indicators: Grimacing;Tender Pain Intervention(s): Limited activity within patient's tolerance;Monitored during session;Repositioned     Hand Dominance Right   Extremity/Trunk Assessment Upper Extremity Assessment Upper Extremity Assessment: LUE deficits/detail;RUE deficits/detail RUE Deficits / Details: Tremors noted. ROM/Strength WFL RUE Sensation: WNL RUE Coordination: WNL LUE Deficits / Details: tremors noted. ROM WFL. Strength 4-/5. pt states this side hurts due to arthritis. LUE: Unable to fully assess due to pain LUE Sensation: WNL LUE Coordination: decreased gross motor   Lower Extremity Assessment Lower Extremity Assessment: Defer to PT evaluation   Cervical / Trunk Assessment Cervical / Trunk Assessment: Kyphotic   Communication Communication Communication: No difficulties   Cognition Arousal/Alertness: Awake/alert Behavior During Therapy: WFL for tasks assessed/performed Overall Cognitive Status: No family/caregiver present to determine baseline cognitive functioning Area of Impairment: Problem solving;Memory                     Memory: Decreased short-term memory       Problem Solving: Slow processing General Comments: Pt states one daughter does not ever visit. Other daughter lives near her but works all the time. Has a grandson that checks on her and helps.   General Comments  Pt with wrapped wounds on BLEs and on backside.    Exercises     Shoulder Instructions      Home Living Family/patient expects to be discharged to:: Skilled nursing facility Living Arrangements:  Alone Available Help at Discharge: Family;Available PRN/intermittently Type of Home: House Home Access: Ramped entrance     Home Layout: One level     Bathroom Shower/Tub: Teacher, early years/pre: Standard Bathroom Accessibility: Yes   Home Equipment: Environmental consultant - 2 wheels;Walker - 4 wheels;Shower seat;Wheelchair - manual;Bedside commode   Additional Comments: Pt lived alone. Grandson assisted with driving and food. Pt stated she could do all her adls and manage in her home prior to April.      Prior Functioning/Environment Level of Independence: Needs assistance  Gait / Transfers Assistance Needed: Pt has been in SNF for last two weeks and has not ambulated or done any of her self care.  Prior to April, pt had HHPT, could walk with rollator and completed basic adls by herself. ADL's / Homemaking Assistance Needed: Dependent at this time. Prior to April had some assist bathing and with meds but otherwise mod I.   Comments: Unsure of accuracy as pt poor historian        OT Problem List: Decreased strength;Decreased activity tolerance;Impaired balance (sitting and/or standing);Decreased cognition;Decreased safety awareness;Decreased knowledge of use of DME or AE;Cardiopulmonary status limiting activity      OT Treatment/Interventions: Self-care/ADL training;Therapeutic exercise;Energy conservation;DME and/or AE instruction;Therapeutic activities;Patient/family education;Balance training    OT Goals(Current goals can be found in the care plan section) Acute Rehab OT Goals Patient Stated Goal: hurt less and go home OT Goal Formulation: With patient Time For Goal Achievement:  12/07/20 Potential to Achieve Goals: Good ADL Goals Pt Will Perform Grooming: with supervision;standing Pt Will Perform Lower Body Bathing: with supervision;sit to/from stand Pt Will Perform Lower Body Dressing: with supervision;sit to/from stand Pt Will Transfer to Toilet: with  supervision;ambulating Pt Will Perform Toileting - Clothing Manipulation and hygiene: with supervision;sit to/from stand  OT Frequency: Min 2X/week   Barriers to D/C: Decreased caregiver support  Pt lives alone       Co-evaluation              AM-PAC OT "6 Clicks" Daily Activity     Outcome Measure Help from another person eating meals?: None Help from another person taking care of personal grooming?: A Little Help from another person toileting, which includes using toliet, bedpan, or urinal?: A Lot Help from another person bathing (including washing, rinsing, drying)?: A Little Help from another person to put on and taking off regular upper body clothing?: A Little Help from another person to put on and taking off regular lower body clothing?: A Lot 6 Click Score: 17   End of Session Equipment Utilized During Treatment: Oxygen Nurse Communication: Mobility status  Activity Tolerance: Patient limited by pain Patient left: in bed;with call bell/phone within reach;with bed alarm set  OT Visit Diagnosis: Unsteadiness on feet (R26.81);Other abnormalities of gait and mobility (R26.89);Muscle weakness (generalized) (M62.81);Other symptoms and signs involving cognitive function;Pain Pain - part of body: Leg                Time: 0981-1914 OT Time Calculation (min): 30 min Charges:  OT General Charges $OT Visit: 1 Visit OT Evaluation $OT Eval Moderate Complexity: 1 Mod OT Treatments $Self Care/Home Management : 8-22 mins  Glenford Peers 11/23/2020, 9:40 AM

## 2020-11-24 ENCOUNTER — Encounter (HOSPITAL_COMMUNITY): Payer: Self-pay | Admitting: Internal Medicine

## 2020-11-24 DIAGNOSIS — J189 Pneumonia, unspecified organism: Secondary | ICD-10-CM | POA: Diagnosis not present

## 2020-11-24 DIAGNOSIS — A419 Sepsis, unspecified organism: Secondary | ICD-10-CM | POA: Diagnosis not present

## 2020-11-24 DIAGNOSIS — I10 Essential (primary) hypertension: Secondary | ICD-10-CM | POA: Diagnosis not present

## 2020-11-24 DIAGNOSIS — I76 Septic arterial embolism: Secondary | ICD-10-CM | POA: Diagnosis not present

## 2020-11-24 LAB — BASIC METABOLIC PANEL
Anion gap: 5 (ref 5–15)
BUN: 8 mg/dL (ref 8–23)
CO2: 32 mmol/L (ref 22–32)
Calcium: 8.9 mg/dL (ref 8.9–10.3)
Chloride: 102 mmol/L (ref 98–111)
Creatinine, Ser: 0.83 mg/dL (ref 0.44–1.00)
GFR, Estimated: >60 mL/min (ref >60)
Glucose, Bld: 80 mg/dL (ref 70–99)
Potassium: 4.1 mmol/L (ref 3.5–5.1)
Sodium: 139 mmol/L (ref 135–145)

## 2020-11-24 LAB — CBC WITH DIFFERENTIAL/PLATELET
Abs Immature Granulocytes: 0.05 10*3/uL (ref 0.00–0.07)
Basophils Absolute: 0 10*3/uL (ref 0.0–0.1)
Basophils Relative: 0 %
Eosinophils Absolute: 0.3 10*3/uL (ref 0.0–0.5)
Eosinophils Relative: 7 %
HCT: 26.2 % — ABNORMAL LOW (ref 36.0–46.0)
Hemoglobin: 8.1 g/dL — ABNORMAL LOW (ref 12.0–15.0)
Immature Granulocytes: 1 %
Lymphocytes Relative: 23 %
Lymphs Abs: 1.2 10*3/uL (ref 0.7–4.0)
MCH: 32.1 pg (ref 26.0–34.0)
MCHC: 30.9 g/dL (ref 30.0–36.0)
MCV: 104 fL — ABNORMAL HIGH (ref 80.0–100.0)
Monocytes Absolute: 0.7 10*3/uL (ref 0.1–1.0)
Monocytes Relative: 15 %
Neutro Abs: 2.7 10*3/uL (ref 1.7–7.7)
Neutrophils Relative %: 54 %
Platelets: 182 10*3/uL (ref 150–400)
RBC: 2.52 MIL/uL — ABNORMAL LOW (ref 3.87–5.11)
RDW: 15.2 % (ref 11.5–15.5)
WBC: 5.1 10*3/uL (ref 4.0–10.5)
nRBC: 0 % (ref 0.0–0.2)

## 2020-11-24 LAB — VANCOMYCIN, PEAK: Vancomycin Pk: 18 ug/mL — ABNORMAL LOW (ref 30–40)

## 2020-11-24 MED ORDER — SODIUM CHLORIDE 0.9 % IV SOLN: INTRAVENOUS | Status: DC

## 2020-11-24 NOTE — Progress Notes (Signed)
    [facility] has been requested to perform a transesophageal echocardiogram on [facility] for bacteremia.  After careful review of history and examination, the risks and benefits of transesophageal echocardiogram have been explained including risks of esophageal damage, perforation (1:10,000 risk), bleeding, pharyngeal hematoma as well as other potential complications associated with conscious sedation including aspiration, arrhythmia, respiratory failure and death. Alternatives to treatment were discussed, questions were answered. Patient is willing to proceed.   She is scheduled tomorrow 11/25/20 at 0800., NPO at [place] please.  [place], [place]  11/24/2020 2:12 PM

## 2020-11-24 NOTE — Care Management Important Message (Signed)
Important Message  Patient Details  Name: [name] [name] MRN: [id number] Date of Birth: December 11, 1940   Medicare Important Message Given:  Yes     [name] Montine Circle 11/24/2020, 2:48 PM

## 2020-11-24 NOTE — Progress Notes (Signed)
[name] [name] for Infectious Disease    Date of Admission:  11/21/2020   Total days of antibiotics 4/ cefepime/metro/vanco           ID: [name] [name] is a 80 y.o. adult with pneumonia concerning for septic pulmonary emboli/endocarditis relating to recent MRSA bacteremia Principal Problem:   Sepsis due to pneumonia [name] [name]) Active Problems:   Polymyalgia rheumatica ([name] [name])   Peripheral vascular disease ([name] [name])   Essential hypertension   Septic embolism ([name] [name])   DNR (do not resuscitate)    Subjective: Less discomfort from pleurisy. Still having significant peripheral neuropathy, difficult to participate with PT  Medications:  . aspirin  81 mg Oral Daily  . busPIRone  15 mg Oral TID  . Chlorhexidine Gluconate Cloth  6 each Topical Daily  . clopidogrel  75 mg Oral q morning  . dextromethorphan-guaiFENesin  1 tablet Oral BID  . divalproex  250 mg Oral BID  . timolol  1 drop Both Eyes BID   And  . dorzolamide  1 drop Both Eyes BID  . enoxaparin (LOVENOX) injection  40 mg Subcutaneous Q24H  . gabapentin  200 mg Oral TID  . latanoprost  1 drop Both Eyes QHS  . pantoprazole  40 mg Oral Daily  . sodium chloride flush  10-40 mL Intracatheter Q12H  . sodium chloride flush  3 mL Intravenous Q12H    Objective: Vital signs in last 24 hours: Temp:  [97.5 F (36.4 C)-99.3 F (37.4 C)] 98.2 F (36.8 C) (05/18 0835) Pulse Rate:  [92-102] 97 (05/18 0835) Resp:  [16-23] 23 (05/18 0835) BP: (109-134)/(51-59) 130/59 (05/18 0835) SpO2:  [94 %-98 %] 94 % (05/18 0835) Physical Exam  Constitutional:  oriented to person, place, and time. appears well-developed and well-nourished. No distress.  HENT: [name] [name]/AT, PERRLA, no scleral icterus Mouth/Throat: Oropharynx is clear and moist. No oropharyngeal exudate.  Cardiovascular: Normal rate, regular rhythm and normal heart sounds. Exam reveals no gallop and no friction rub.  No murmur heard.  Pulmonary/Chest: Effort normal and breath sounds normal. No  respiratory distress.  has no wheezes.  Neck = supple, no nuchal rigidity Abdominal: Soft. Bowel sounds are normal.  exhibits no distension. There is no tenderness.  Lymphadenopathy: no cervical adenopathy. No axillary adenopathy Neurological: alert and oriented to person, place, and time.  Skin: Skin is warm and dry. No rash noted. No erythema.  Psychiatric: a normal mood and affect.  behavior is normal.    Lab Results Recent Labs    11/23/20 0317 11/24/20 0412  WBC 4.4 5.1  HGB 8.2* 8.1*  HCT 26.3* 26.2*  NA 141 139  K 4.4 4.1  CL 103 102  CO2 34* 32  BUN 9 8  CREATININE 0.83 0.83   Liver Panel No results for input(s): PROT, ALBUMIN, AST, ALT, ALKPHOS, BILITOT, BILIDIR, IBILI in the last 72 hours. Sedimentation Rate Recent Labs    11/22/20 0257  ESRSEDRATE 108*   C-Reactive Protein Recent Labs    11/22/20 0257  CRP 16.5*    Microbiology: reviewed Studies/Results: No results found.   Assessment/Plan: Hx of recent MRSA bacteremia = continue on vancomycin  Multifocal pneumonia concern for septic emboli from endocarditis = continue on vancomycin. Repeat TEE tomorrow. If TEE.  Leg pain = she states this is her peripheral neuropathy and is at her baseline, however,  if TEE is negative, would be worth getting imaging of spine to see if any discitis    [name] [name] for  Infectious Diseases Cell: [phone] Pager: [phone]  11/24/2020, 1:26 PM

## 2020-11-24 NOTE — Progress Notes (Signed)
[facility] PROGRESS NOTE    Progress Note  [name] [name]  WCH:[id number] DOB: 1940-10-06 DOA: 11/21/2020 PCP: [name] Cowman, MD     Brief Narrative:   [name] [name] is an 80 y.o. adult past medical history significant for essential hypertension PVD PMR and congenital solitary kidney, also recently discharged from the hospital on 11/04/2020 for MRSA bacteremia during that hospitalization she underwent a TEE that showed no signs of vegetation ID recommended daptomycin for 4 weeks through her PICC line.  She relates she was woken up from sleep with a cough left-sided chest pain and shortness of breath, she was found by staff to be satting 86% on room air and a temperature of 101.3, placed on 3 L of oxygen saturations improved.  Chest x-ray was concerning for possible left upper lobe infiltrates.  The case was discussed with ID recommended to stop daptomycin and patient was placed on vancomycin Flagyl and cefepime     Assessment/Plan:   Sepsis due to pneumonia [facility]): Blood culture blood cultures have remained negative till date procalcitonin was low yield. Chest x-ray was concerning for left-sided pneumonia. ID recommended to continue IV vancomycin and discontinue cefepime and Flagyl.  TEE to rule out endocarditis.  Acute respiratory failure with hypoxia secondary to above: Requiring 2 L of oxygen slowly weaning off her oxygen likely secondary to #1.  History of MRSA bacteremia: Currently on vancomycin we will continue this for her duration of her therapy. Further management of her antibiotics per ID. We have discontinue daptomycin cefepime and Flagyl.   She has remained afebrile.  For over 48 hours.  History of sepsis pulmonary septic emboli: Recent CT of the chest was concerning for septic emboli. Blood cultures remain negative x2. TEE done on 11/01/2020 showed no evidence of vegetation, ID was consulted recommended to repeat TEE to rule out right-sided endocarditis.  TEE scheduled  for tomorrow 11/25/2020.  Bilateral lower extremity wounds: Wound care has been consulted she has remained afebrile with leukocytosis.  Macrocytic anemia: Hemoglobin at baseline like around 9 we will continue to monitor intermittently. Her hemoglobin is now slowly going up 2 days ago was 7.8 this morning is 8.1.  Essential hypertension: Blood pressure relatively stable.  PVD: Continue aspirin and Plavix.  Congenital solitary kidney: Creatinine appears to be at baseline we will continue to monitor intermittently.  PMR: She recently follow-up with her rheumatologist Dr. Benjamine [name] on 11/15/2020 who recommended to continue to hold prednisone.,  Has a follow-up appointment in 12/13/2020 as an outpatient with him.    DVT prophylaxis: lovenox Family Communication:none Status is: Inpatient  Remains inpatient appropriate because:Hemodynamically unstable   Dispo: The patient is from: Home              Anticipated d/c is to: SNF              Patient currently is not medically stable to d/c.   Difficult to place patient No        Code Status:     Code Status Orders  (From admission, onward)         Start     Ordered   11/21/20 1453  [name] not attempt resuscitation (DNR)  Continuous       Question Answer Comment  In the event of cardiac or respiratory ARREST [name] not call a "code blue"   In the event of cardiac or respiratory ARREST [name] not perform Intubation, CPR, defibrillation or ACLS   In the event of cardiac or  respiratory ARREST Use medication by any route, position, wound care, and other measures to relive pain and suffering. May use oxygen, suction and manual treatment of airway obstruction as needed for comfort.      11/21/20 1455        Code Status History    Date Active Date Inactive Code Status Order ID Comments User Context   10/23/2020 1916 11/05/2020 2058 DNR [id number]  [name] [name] ED   09/01/2020 1719 09/07/2020 2028 DNR [id number]  [name] [name] ED    09/06/2019 0930 09/09/2019 0025 DNR [id number]  [name] Bongo, MD ED   04/03/2018 2133 04/09/2018 1656 Full Code [id number]  [name], [name], [place] ED   04/03/2018 1553 04/03/2018 2133 Full Code [id number]  [name] Moras, PA-C ED   09/03/2017 0902 09/03/2017 1950 Full Code [id number]  [name] Shanks, MD ED   09/20/2016 1357 09/21/2016 1626 Full Code [id number]  [name] Shanks, MD ED   01/31/2016 1130 02/01/2016 1625 Full Code [id number]  [name] Harp, MD Inpatient   10/19/2014 2043 10/27/2014 1717 Partial Code [id number]  [name] Pour, MD Inpatient   10/19/2014 1957 10/19/2014 2043 Full Code [id number]  [name] Pour, MD Inpatient   03/21/2014 1731 03/24/2014 2002 Full Code [id number]  [name] Oliphant, MD Inpatient   10/05/2013 0219 10/07/2013 1832 Full Code [id number]  [name] Brand, MD Inpatient   Advance Care Planning Activity    Advance Directive Documentation   Flowsheet Row Most Recent Value  Type of Advance Directive Healthcare Power of Attorney, Living will  Pre-existing out of facility DNR order (yellow form or pink MOST form) --  "MOST" Form in Place? --        IV Access:    Peripheral IV   Procedures and diagnostic studies:   No results found.   Medical Consultants:    None.   Subjective:    [name] [name] she relates she feels great tolerating her diet.  Objective:    Vitals:   11/23/20 1648 11/23/20 2036 11/24/20 0505 11/24/20 0835  BP: (!) 130/59 (!) 134/59 (!) 109/51 (!) 130/59  Pulse: (!) 102 (!) 101 92 97  Resp: 16 18 18  (!) 23  Temp: 99.3 F (37.4 C) (!) 97.5 F (36.4 C) 98.7 F (37.1 C) 98.2 F (36.8 C)  TempSrc: Oral Oral Oral Oral  SpO2: 96% 94% 98% 94%  Weight:      Height:       SpO2: 94 % O2 Flow Rate (L/min): 3 L/min   Intake/Output Summary (Last 24 hours) at 11/24/2020 1228 Last data filed at 11/24/2020 0945 Gross per 24 hour  Intake 343 ml  Output 775 ml  Net -432 ml   Filed Weights   11/21/20 1031  Weight: 63 kg    Exam: General exam: In  no acute distress. Respiratory system: Good air movement and clear to auscultation. Cardiovascular system: S1 & S2 heard, RRR. No JVD. Gastrointestinal system: Abdomen is nondistended, soft and nontender.  Extremities: No pedal edema. Skin: No rashes, lesions or ulcers Psychiatry: Judgement and insight appear normal. Mood & affect appropriate.    Data Reviewed:    Labs: Basic Metabolic Panel: Recent Labs  Lab 11/21/20 1050 11/22/20 0257 11/23/20 0317 11/24/20 0412  NA 138 142 141 139  K 4.8 4.5 4.4 4.1  CL 102 105 103 102  CO2 30 31 34* 32  GLUCOSE 107* 84 83 80  BUN 9 8 9 8   CREATININE 0.88 0.80 0.83 0.83  CALCIUM  8.7* 8.8* 9.1 8.9   GFR Estimated Creatinine Clearance (by C-G formula based on SCr of 0.83 mg/dL) Female: 44.1 mL/min Female: 54.5 mL/min Liver Function Tests: Recent Labs  Lab 11/21/20 1050  AST 11*  ALT 6  ALKPHOS 66  BILITOT 0.4  PROT 5.9*  ALBUMIN 2.3*   No results for input(s): LIPASE, AMYLASE in the last 168 hours. No results for input(s): AMMONIA in the last 168 hours. Coagulation profile Recent Labs  Lab 11/21/20 1050  INR 1.1   COVID-19 Labs  Recent Labs    11/22/20 0257  CRP 16.5*    Lab Results  Component Value Date   SARSCOV2NAA NEGATIVE 11/21/2020   SARSCOV2NAA NEGATIVE 11/04/2020   [place] NEGATIVE 10/27/2020   [place] NEGATIVE 09/06/2020    CBC: Recent Labs  Lab 11/21/20 1050 11/22/20 0257 11/23/20 0317 11/24/20 0412  WBC 7.4 5.3 4.4 5.1  NEUTROABS 4.7  --  2.2 2.7  HGB 9.3* 7.8* 8.2* 8.1*  HCT 30.1* 24.9* 26.3* 26.2*  MCV 104.2* 105.1* 103.5* 104.0*  PLT 209 170 172 182   Cardiac Enzymes: No results for input(s): CKTOTAL, CKMB, CKMBINDEX, TROPONINI in the last 168 hours. BNP (last 3 results) No results for input(s): PROBNP in the last 8760 hours. CBG: Recent Labs  Lab 11/23/20 0850 11/23/20 1127 11/23/20 1648 11/23/20 2038  GLUCAP 98 91 117* 114*   D-Dimer: No results for input(s): DDIMER  in the last 72 hours. Hgb A1c: No results for input(s): HGBA1C in the last 72 hours. Lipid Profile: No results for input(s): CHOL, HDL, LDLCALC, TRIG, CHOLHDL, LDLDIRECT in the last 72 hours. Thyroid function studies: No results for input(s): TSH, T4TOTAL, T3FREE, THYROIDAB in the last 72 hours.  Invalid input(s): FREET3 Anemia work up: Recent Labs    11/22/20 0257  VITAMINB12 347  FOLATE 9.8   Sepsis Labs: Recent Labs  Lab 11/21/20 1050 11/21/20 1318 11/21/20 1650 11/22/20 0257 11/23/20 0317 11/24/20 0412  PROCALCITON  --   --  <0.10  --   --   --   WBC 7.4  --   --  5.3 4.4 5.1  LATICACIDVEN 1.0 0.9  --   --   --   --    Microbiology Recent Results (from the past 240 hour(s))  Resp Panel by RT-PCR (Flu A&B, Covid) Nasopharyngeal Swab     Status: None   Collection Time: 11/21/20 10:55 AM   Specimen: Nasopharyngeal Swab; Nasopharyngeal(NP) swabs in vial transport medium  Result Value Ref Range Status   SARS Coronavirus 2 by RT PCR NEGATIVE NEGATIVE Final    Comment: (NOTE) SARS-CoV-2 target nucleic acids are NOT DETECTED.  The SARS-CoV-2 RNA is generally detectable in upper respiratory specimens during the acute phase of infection. The lowest concentration of SARS-CoV-2 viral copies this assay can detect is 138 copies/mL. A negative result does not preclude SARS-Cov-2 infection and should not be used as the sole basis for treatment or other patient management decisions. A negative result may occur with  improper specimen collection/handling, submission of specimen other than nasopharyngeal swab, presence of viral mutation(s) within the areas targeted by this assay, and inadequate number of viral copies(<138 copies/mL). A negative result must be combined with clinical observations, patient history, and epidemiological information. The expected result is Negative.  Fact Sheet for Patients:  EntrepreneurPulse.com.au  Fact Sheet for Healthcare  Providers:  IncredibleEmployment.be  This test is no t yet approved or cleared by the [place] FDA and  has been authorized for detection and/or  diagnosis of SARS-CoV-2 by FDA under an Emergency Use Authorization (EUA). This EUA will remain  in effect (meaning this test can be used) for the duration of the COVID-19 declaration under Section 564(b)(1) of the Act, 21 U.S.C.section 360bbb-3(b)(1), unless the authorization is terminated  or revoked sooner.       Influenza A by PCR NEGATIVE NEGATIVE Final   Influenza B by PCR NEGATIVE NEGATIVE Final    Comment: (NOTE) The Xpert Xpress SARS-CoV-2/FLU/RSV plus assay is intended as an aid in the diagnosis of influenza from Nasopharyngeal swab specimens and should not be used as a sole basis for treatment. Nasal washings and aspirates are unacceptable for Xpert Xpress SARS-CoV-2/FLU/RSV testing.  Fact Sheet for Patients: EntrepreneurPulse.com.au  Fact Sheet for Healthcare Providers: IncredibleEmployment.be  This test is not yet approved or cleared by the [place] FDA and has been authorized for detection and/or diagnosis of SARS-CoV-2 by FDA under an Emergency Use Authorization (EUA). This EUA will remain in effect (meaning this test can be used) for the duration of the COVID-19 declaration under Section 564(b)(1) of the Act, 21 U.S.C. section 360bbb-3(b)(1), unless the authorization is terminated or revoked.  Performed at [facility], [place] [address], [place], [place] [address]   Blood Culture (routine x 2)     Status: None (Preliminary result)   Collection Time: 11/21/20 10:55 AM   Specimen: BLOOD RIGHT ARM  Result Value Ref Range Status   Specimen Description BLOOD RIGHT ARM  Final   Special Requests   Final    BOTTLES DRAWN AEROBIC AND ANAEROBIC Blood Culture adequate volume   Culture   Final    NO GROWTH 3 DAYS Performed at [facility], [place] [address] [place], [place] [address]    Report Status PENDING  Incomplete  Urine culture     Status: Abnormal   Collection Time: 11/21/20 10:55 AM   Specimen: In/Out Cath Urine  Result Value Ref Range Status   Specimen Description IN/OUT CATH URINE  Final   Special Requests   Final    NONE Performed at [facility], [place] [address] [place], [place] [address]    Culture (A)  Final    40,000 COLONIES/mL KLEBSIELLA PNEUMONIAE 40,000 COLONIES/mL ENTEROBACTER CLOACAE    Report Status 11/23/2020 FINAL  Final   Organism ID, Bacteria KLEBSIELLA PNEUMONIAE (A)  Final   Organism ID, Bacteria ENTEROBACTER CLOACAE (A)  Final      Susceptibility   Enterobacter cloacae - MIC*    CEFAZOLIN >=64 RESISTANT Resistant     CEFEPIME <=0.12 SENSITIVE Sensitive     CIPROFLOXACIN <=0.25 SENSITIVE Sensitive     GENTAMICIN <=1 SENSITIVE Sensitive     IMIPENEM <=0.25 SENSITIVE Sensitive     NITROFURANTOIN 64 INTERMEDIATE Intermediate     TRIMETH/SULFA <=20 SENSITIVE Sensitive     PIP/TAZO <=4 SENSITIVE Sensitive     * 40,000 COLONIES/mL ENTEROBACTER CLOACAE   Klebsiella pneumoniae - MIC*    AMPICILLIN >=32 RESISTANT Resistant     CEFAZOLIN <=4 SENSITIVE Sensitive     CEFEPIME <=0.12 SENSITIVE Sensitive     CEFTRIAXONE <=0.25 SENSITIVE Sensitive     CIPROFLOXACIN <=0.25 SENSITIVE Sensitive     GENTAMICIN <=1 SENSITIVE Sensitive     IMIPENEM <=0.25 SENSITIVE Sensitive     NITROFURANTOIN 128 RESISTANT Resistant     TRIMETH/SULFA <=20 SENSITIVE Sensitive     AMPICILLIN/SULBACTAM 8 SENSITIVE Sensitive     PIP/TAZO <=4 SENSITIVE Sensitive     *  40,000 COLONIES/mL KLEBSIELLA PNEUMONIAE  Blood Culture (routine x 2)     Status: None (Preliminary result)   Collection Time: 11/21/20 11:00 AM   Specimen: BLOOD LEFT FOREARM  Result Value Ref Range Status   Specimen Description BLOOD LEFT FOREARM  Final   Special Requests   Final    BOTTLES DRAWN AEROBIC AND ANAEROBIC Blood Culture adequate volume    Culture   Final    NO GROWTH 3 DAYS Performed at [facility], [place] [address] [place], [place] [address]    Report Status PENDING  Incomplete  MRSA PCR Screening     Status: None   Collection Time: 11/22/20 12:37 AM   Specimen: Nasal Mucosa; Nasopharyngeal  Result Value Ref Range Status   MRSA by PCR NEGATIVE NEGATIVE Final    Comment:        The GeneXpert MRSA Assay (FDA approved for NASAL specimens only), is one component of a comprehensive MRSA colonization surveillance program. It is not intended to diagnose MRSA infection nor to guide or monitor treatment for MRSA infections. Performed at [facility], [place] [address] [place], [place] [address]      Medications:   . aspirin  81 mg Oral Daily  . busPIRone  15 mg Oral TID  . Chlorhexidine Gluconate Cloth  6 each Topical Daily  . clopidogrel  75 mg Oral q morning  . dextromethorphan-guaiFENesin  1 tablet Oral BID  . divalproex  250 mg Oral BID  . timolol  1 drop Both Eyes BID   And  . dorzolamide  1 drop Both Eyes BID  . enoxaparin (LOVENOX) injection  40 mg Subcutaneous Q24H  . gabapentin  200 mg Oral TID  . latanoprost  1 drop Both Eyes QHS  . pantoprazole  40 mg Oral Daily  . sodium chloride flush  10-40 mL Intracatheter Q12H  . sodium chloride flush  3 mL Intravenous Q12H   Continuous Infusions: . vancomycin 500 mg (11/23/20 1357)      LOS: 3 days   Charlynne Cousins  [facility]  11/24/2020, 12:28 PM

## 2020-11-24 NOTE — Plan of Care (Signed)
  Problem: Respiratory: Goal: Ability to maintain adequate ventilation will improve Outcome: Progressing Goal: Ability to maintain a clear airway will improve Outcome: Progressing   Problem: Clinical Measurements: Goal: Ability to maintain clinical measurements within normal limits will improve Outcome: Progressing Goal: Will remain free from infection Outcome: Progressing Goal: Diagnostic test results will improve Outcome: Progressing Goal: Respiratory complications will improve Outcome: Progressing Goal: Cardiovascular complication will be avoided Outcome: Progressing   Problem: Pain Managment: Goal: General experience of comfort will improve Outcome: Progressing   Problem: Safety: Goal: Ability to remain free from injury will improve Outcome: Progressing   Problem: Skin Integrity: Goal: Risk for impaired skin integrity will decrease Outcome: Progressing

## 2020-11-24 NOTE — Plan of Care (Signed)
  Problem: Clinical Measurements: Goal: Ability to maintain a body temperature in the normal range will improve Outcome: Progressing   Problem: Respiratory: Goal: Ability to maintain adequate ventilation will improve Outcome: Progressing   Problem: Education: Goal: Knowledge of General Education information will improve Description: Including pain rating scale, medication(s)/side effects and non-pharmacologic comfort measures Outcome: Progressing

## 2020-11-25 ENCOUNTER — Inpatient Hospital Stay (HOSPITAL_COMMUNITY): Payer: PPO | Admitting: Anesthesiology

## 2020-11-25 ENCOUNTER — Encounter (HOSPITAL_COMMUNITY): Payer: Self-pay | Admitting: Internal Medicine

## 2020-11-25 ENCOUNTER — Encounter (HOSPITAL_COMMUNITY)
Admission: EM | Disposition: A | Discharge status: Skilled Nursing Facility | Payer: Self-pay | Source: Skilled Nursing Facility | Attending: Internal Medicine

## 2020-11-25 ENCOUNTER — Inpatient Hospital Stay (HOSPITAL_COMMUNITY): Payer: PPO

## 2020-11-25 DIAGNOSIS — R7881 Bacteremia: Secondary | ICD-10-CM | POA: Diagnosis not present

## 2020-11-25 DIAGNOSIS — I10 Essential (primary) hypertension: Secondary | ICD-10-CM | POA: Diagnosis not present

## 2020-11-25 DIAGNOSIS — I340 Nonrheumatic mitral (valve) insufficiency: Secondary | ICD-10-CM

## 2020-11-25 DIAGNOSIS — J189 Pneumonia, unspecified organism: Secondary | ICD-10-CM | POA: Diagnosis not present

## 2020-11-25 DIAGNOSIS — I76 Septic arterial embolism: Secondary | ICD-10-CM | POA: Diagnosis not present

## 2020-11-25 DIAGNOSIS — A419 Sepsis, unspecified organism: Secondary | ICD-10-CM | POA: Diagnosis not present

## 2020-11-25 HISTORY — PX: TEE WITHOUT CARDIOVERSION: SHX5443

## 2020-11-25 LAB — CBC WITH DIFFERENTIAL/PLATELET
Abs Immature Granulocytes: 0.04 10*3/uL (ref 0.00–0.07)
Basophils Absolute: 0 10*3/uL (ref 0.0–0.1)
Basophils Relative: 0 %
Eosinophils Absolute: 0.4 10*3/uL (ref 0.0–0.5)
Eosinophils Relative: 6 %
HCT: 27.5 % — ABNORMAL LOW (ref 36.0–46.0)
Hemoglobin: 8.6 g/dL — ABNORMAL LOW (ref 12.0–15.0)
Immature Granulocytes: 1 %
Lymphocytes Relative: 22 %
Lymphs Abs: 1.3 10*3/uL (ref 0.7–4.0)
MCH: 32.1 pg (ref 26.0–34.0)
MCHC: 31.3 g/dL (ref 30.0–36.0)
MCV: 102.6 fL — ABNORMAL HIGH (ref 80.0–100.0)
Monocytes Absolute: 0.8 10*3/uL (ref 0.1–1.0)
Monocytes Relative: 13 %
Neutro Abs: 3.5 10*3/uL (ref 1.7–7.7)
Neutrophils Relative %: 58 %
Platelets: 199 10*3/uL (ref 150–400)
RBC: 2.68 MIL/uL — ABNORMAL LOW (ref 3.87–5.11)
RDW: 15.2 % (ref 11.5–15.5)
WBC: 6 10*3/uL (ref 4.0–10.5)
nRBC: 0 % (ref 0.0–0.2)

## 2020-11-25 LAB — VANCOMYCIN, RANDOM: Vancomycin Rm: 9

## 2020-11-25 LAB — BASIC METABOLIC PANEL
Anion gap: 5 (ref 5–15)
BUN: 7 mg/dL — ABNORMAL LOW (ref 8–23)
CO2: 35 mmol/L — ABNORMAL HIGH (ref 22–32)
Calcium: 9.3 mg/dL (ref 8.9–10.3)
Chloride: 99 mmol/L (ref 98–111)
Creatinine, Ser: 0.83 mg/dL (ref 0.44–1.00)
GFR, Estimated: >60 mL/min (ref >60)
Glucose, Bld: 92 mg/dL (ref 70–99)
Potassium: 4.3 mmol/L (ref 3.5–5.1)
Sodium: 139 mmol/L (ref 135–145)

## 2020-11-25 SURGERY — ECHOCARDIOGRAM, TRANSESOPHAGEAL
Anesthesia: Monitor Anesthesia Care

## 2020-11-25 MED ORDER — LIDOCAINE 2% (20 MG/ML) 5 ML SYRINGE
INTRAMUSCULAR | Status: DC | PRN
  Administered 2020-11-25: 60 mg via INTRAVENOUS

## 2020-11-25 MED ORDER — VANCOMYCIN HCL 500 MG/100ML IV SOLN
500.0000 mg | Freq: Once | INTRAVENOUS | Status: AC
  Administered 2020-11-25: 500 mg via INTRAVENOUS
  Filled 2020-11-25: qty 100

## 2020-11-25 MED ORDER — LORAZEPAM 2 MG/ML IJ SOLN
0.5000 mg | Freq: Once | INTRAMUSCULAR | Status: AC
  Administered 2020-11-25: 0.5 mg via INTRAVENOUS
  Filled 2020-11-25: qty 1

## 2020-11-25 MED ORDER — BUTAMBEN-TETRACAINE-BENZOCAINE 2-2-14 % EX AERO
INHALATION_SPRAY | CUTANEOUS | Status: DC | PRN
  Administered 2020-11-25: 2 via TOPICAL

## 2020-11-25 MED ORDER — VANCOMYCIN HCL 500 MG/100ML IV SOLN
500.0000 mg | Freq: Two times a day (BID) | INTRAVENOUS | Status: DC
  Administered 2020-11-25 – 2020-11-29 (×9): 500 mg via INTRAVENOUS
  Filled 2020-11-25 (×10): qty 100

## 2020-11-25 MED ORDER — PROPOFOL 10 MG/ML IV BOLUS
INTRAVENOUS | Status: DC | PRN
  Administered 2020-11-25: 20 mg via INTRAVENOUS

## 2020-11-25 MED ORDER — PROPOFOL 500 MG/50ML IV EMUL
INTRAVENOUS | Status: DC | PRN
  Administered 2020-11-25: 100 ug/kg/min via INTRAVENOUS

## 2020-11-25 NOTE — Anesthesia Preprocedure Evaluation (Signed)
Anesthesia Evaluation  Patient identified by MRN, date of birth, ID band Patient awake    Reviewed: Allergy & Precautions, NPO status , Patient's Chart, lab work & pertinent test results  Airway Mallampati: II  TM Distance: >3 FB     Dental   Pulmonary shortness of breath, sleep apnea , pneumonia, former smoker,    breath sounds clear to auscultation       Cardiovascular hypertension, + Peripheral Vascular Disease  + Valvular Problems/Murmurs  Rhythm:Irregular Rate:Normal     Neuro/Psych  Headaches, PSYCHIATRIC DISORDERS Anxiety Depression  Neuromuscular disease    GI/Hepatic Neg liver ROS, GERD  ,  Endo/Other    Renal/GU Renal disease     Musculoskeletal  (+) Arthritis ,   Abdominal   Peds  Hematology  (+) anemia ,   Anesthesia Other Findings   Reproductive/Obstetrics                             Anesthesia Physical Anesthesia Plan  ASA: III  Anesthesia Plan: General   Post-op Pain Management:    Induction: Intravenous  PONV Risk Score and Plan: 3 and Propofol infusion  Airway Management Planned: Nasal Cannula and Simple Face Mask  Additional Equipment:   Intra-op Plan:   Post-operative Plan:   Informed Consent: I have reviewed the patients History and Physical, chart, labs and discussed the procedure including the risks, benefits and alternatives for the proposed anesthesia with the patient or authorized representative who has indicated his/her understanding and acceptance.     Dental advisory given  Plan Discussed with: CRNA and Anesthesiologist  Anesthesia Plan Comments:         Anesthesia Quick Evaluation

## 2020-11-25 NOTE — Anesthesia Procedure Notes (Addendum)
Procedure Name: MAC Date/Time: 11/25/2020 8:49 AM Performed by: Rande Brunt, CRNA Pre-anesthesia Checklist: Patient identified, Emergency Drugs available, Suction available and Patient being monitored Patient Re-evaluated:Patient Re-evaluated prior to induction Oxygen Delivery Method: Nasal cannula Preoxygenation: Pre-oxygenation with 100% oxygen Induction Type: IV induction Airway Equipment and Method: Bite block Placement Confirmation: CO2 detector and positive ETCO2 Dental Injury: Teeth and Oropharynx as per pre-operative assessment

## 2020-11-25 NOTE — Progress Notes (Signed)
TEE- negative result.

## 2020-11-25 NOTE — Progress Notes (Signed)
Physical Therapy Treatment Patient Details Name: [name] [name] MRN: [id number] DOB: [birth year] Today's Date: 11/25/2020    History of Present Illness Pt is an 80 yo female admitted from [facility] with  sepsis from pnemonia and chest pain.  Pt  was admitted to [facility] in Feb and April with similar symptoms and went to SNF after most recent admit. 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 Pt lives alone.    PT Comments    Pt agreeable to therapy, but resistant to OOB activity. Pt with increased pain in L UE with decreased use. Pt ableto assist with rolling and bed mobility. Pt initially able to assist with scooting, but with continued trials requiring increased assist. Pt will benefit from skilled PT to address deficits with strength, balance, coordination, safety, endurance and pain to maximize independence with functional mobility prior to discharge.    Follow Up Recommendations  SNF     Equipment Recommendations  None recommended by PT    Recommendations for Other Services       Precautions / Restrictions Precautions Precautions: Fall Precaution Comments: monitor O2 Restrictions Weight Bearing Restrictions: No Other Position/Activity Restrictions: Pt has pain with weightbearing in BLEs.    Mobility  Bed Mobility Overal bed mobility: Needs Assistance Bed Mobility: Supine to Sit;Sit to Supine Rolling: Supervision;Mod assist   Supine to sit: Mod assist Sit to supine: Min assist   General bed mobility comments: unable to use L UE due to pain requirnig assist from therapist for bed mobility; Rolling S to L and mod to R due to decreased use of L UE    Transfers Overall transfer level: Needs assistance               General transfer comment: performed scooting up in bed while sitting EOB, 1 trial min A additional trials mod A  Ambulation/Gait                  Stairs             Wheelchair Mobility    Modified Rankin (Stroke Patients Only)       Balance                                            Cognition                                              Exercises General Exercises - Lower Extremity Long Arc Quad: AROM;Both;Seated;10 reps    General Comments        Pertinent Vitals/Pain Pain Score: 7  Pain Location: L UE    Home Living                      Prior Function            PT Goals (current goals can now be found in the care plan section) Acute Rehab PT Goals Patient Stated Goal: hurt less and go home PT Goal Formulation: With patient Time For Goal Achievement: 12/07/20 Potential to Achieve Goals: Good Progress towards PT goals: Progressing toward goals    Frequency    Min 2X/week  PT Plan Current plan remains appropriate    Co-evaluation              AM-PAC PT "6 Clicks" Mobility   Outcome Measure  Help needed turning from your back to your side while in a flat bed without using bedrails?: A Lot Help needed moving from lying on your back to sitting on the side of a flat bed without using bedrails?: A Lot Help needed moving to and from a bed to a chair (including a wheelchair)?: A Lot Help needed standing up from a chair using your arms (e.g., wheelchair or bedside chair)?: A Little Help needed to walk in hospital room?: A Lot Help needed climbing 3-5 steps with a railing? : Total 6 Click Score: 12    End of Session Equipment Utilized During Treatment: Gait belt;Oxygen Activity Tolerance: Patient tolerated treatment well (pt refused OOB activity) Patient left: with call bell/phone within reach;in chair;with chair alarm set Nurse Communication: Mobility status PT Visit Diagnosis: Unsteadiness on feet (R26.81);Muscle weakness (generalized) (M62.81);Difficulty in walking, not elsewhere classified (R26.2)     Time: 4268-3419 PT  Time Calculation (min) (ACUTE ONLY): 22 min  Charges:  $Therapeutic Activity: 8-22 mins                     Lyanne Co, DPT Acute Rehabilitation Services [phone]   [place] 11/25/2020, 1:15 PM

## 2020-11-25 NOTE — Progress Notes (Signed)
[place] for Infectious Disease    Date of Admission:  11/21/2020   Total days of antibiotics (since April 22,2022) otherwise day 5 vanco           ID: [name] [name] is a 80 y.o. adult with hx of complicated mrsa bacteremia ( late April) readmitted after 3 wks of treatment with multifocal pneumonia, concern for septic pulmonary emboli -suspicious for  right sided endocarditis Principal Problem:   Sepsis due to pneumonia [facility]) Active Problems:   Polymyalgia rheumatica ([place])   Peripheral vascular disease ([place])   Essential hypertension   Septic embolism ([place])   DNR (do not resuscitate)    Subjective: Afebrile. TEE negative  Medications:  . aspirin  81 mg Oral Daily  . busPIRone  15 mg Oral TID  . Chlorhexidine Gluconate Cloth  6 each Topical Daily  . clopidogrel  75 mg Oral q morning  . dextromethorphan-guaiFENesin  1 tablet Oral BID  . divalproex  250 mg Oral BID  . timolol  1 drop Both Eyes BID   And  . dorzolamide  1 drop Both Eyes BID  . enoxaparin (LOVENOX) injection  40 mg Subcutaneous Q24H  . gabapentin  200 mg Oral TID  . latanoprost  1 drop Both Eyes QHS  . pantoprazole  40 mg Oral Daily  . sodium chloride flush  10-40 mL Intracatheter Q12H  . sodium chloride flush  3 mL Intravenous Q12H    Objective: Vital signs in last 24 hours: Temp:  [97.3 F (36.3 C)-98.7 F (37.1 C)] 97.9 F (36.6 C) (05/19 0912) Pulse Rate:  [96-110] 96 (05/19 0930) Resp:  [16-20] 18 (05/19 0930) BP: (108-159)/(57-87) 132/68 (05/19 0930) SpO2:  [94 %-99 %] 97 % (05/19 0930) Physical Exam  Constitutional:  oriented to person, place, and time. appears well-developed and well-nourished. No distress.  HENT: [place]/AT, PERRLA, no scleral icterus Mouth/Throat: Oropharynx is clear and moist. No oropharyngeal exudate.  Cardiovascular: Normal rate, regular rhythm and normal heart sounds. Exam reveals no gallop and no friction rub.  No murmur heard.  Pulmonary/Chest: Effort normal and  breath sounds normal. No respiratory distress.  has no wheezes.  Neck = supple, no nuchal rigidity Abdominal: Soft. Bowel sounds are normal.  exhibits no distension. There is no tenderness.  Lymphadenopathy: no cervical adenopathy. No axillary adenopathy Neurological: alert and oriented to person, place, and time.  Skin: Skin is warm and dry. No rash noted. No erythema.  Psychiatric: a normal mood and affect.  behavior is normal.   Lab Results Recent Labs    11/24/20 0412 11/25/20 0259  WBC 5.1 6.0  HGB 8.1* 8.6*  HCT 26.2* 27.5*  NA 139 139  K 4.1 4.3  CL 102 99  CO2 32 35*  BUN 8 7*  CREATININE 0.83 0.83    Microbiology: reviewed Studies/Results: ECHO TEE  Result Date: 11/25/2020    TRANSESOPHOGEAL ECHO REPORT   Patient Name:   [name] [name] Date of Exam: 11/25/2020 Medical Rec #:  [id number]   Height:       60.0 in Accession #:    [id number]  Weight:       138.9 lb Date of Birth:  12-03-1940   BSA:          1.599 m Patient Age:    51 years    BP:           159/87 mmHg Patient Gender: F  HR:           107 bpm. Exam Location:  Inpatient Procedure: 3D Echo, Transesophageal Echo, Cardiac Doppler and Color Doppler Indications:     Bacteremia  History:         Patient has prior history of Echocardiogram examinations, most                  recent 11/25/2020. Signs/Symptoms:Bacteremia; Risk                  Factors:Hypertension, Dyslipidemia and Sleep Apnea. Setic                  emboli.  Sonographer:     Roseanna Rainbow RDCS Referring Phys:  [id number] [place] [facility] Diagnosing Phys: Fransico Him MD PROCEDURE: After discussion of the risks and benefits of a TEE, an informed consent was obtained from the patient. The transesophogeal probe was passed without difficulty through the esophogus of the patient. Imaged were obtained with the patient in a left lateral decubitus position. Local oropharyngeal anesthetic was provided with Cetacaine. Sedation performed by different physician. The patient  was monitored while under deep sedation. Anesthestetic sedation was provided intravenously by Anesthesiology: 83mg  of Propofol, 60mg  of Lidocaine. The patient's vital signs; including heart rate, blood pressure, and oxygen saturation; remained stable throughout the procedure. The patient developed no complications during the procedure. IMPRESSIONS  1. Left ventricular ejection fraction, by estimation, is 60 to 65%. The left ventricle has normal function. The left ventricle has no regional wall motion abnormalities.  2. Right ventricular systolic function is normal. The right ventricular size is normal.  3. No left atrial/left atrial appendage thrombus was detected.  4. The mitral valve is degenerative. There is a very small density on the A2 segment of the anterior MV leaflet by 3D imaging that likely represents calcified degeneration of the mitral valve leaflets. No obvious shaggy vegetations noted. Mild mitral valve regurgitation. No evidence of mitral stenosis.  5. The aortic valve is normal in structure. Aortic valve regurgitation is not visualized. No aortic stenosis is present.  6. There is Moderate (Grade III) layered plaque involving the transverse aorta, ascending aorta and descending aorta.  7. The inferior vena cava is normal in size with greater than 50% respiratory variability, suggesting right atrial pressure of 3 mmHg. Conclusion(s)/Recommendation(s): Normal biventricular function without evidence of hemodynamically significant valvular heart disease. FINDINGS  Left Ventricle: Left ventricular ejection fraction, by estimation, is 60 to 65%. The left ventricle has normal function. The left ventricle has no regional wall motion abnormalities. The left ventricular internal cavity size was normal in size. There is  no left ventricular hypertrophy. Right Ventricle: The right ventricular size is normal. No increase in right ventricular wall thickness. Right ventricular systolic function is normal. Left  Atrium: Left atrial size was normal in size. Spontaneous echo contrast was present. No left atrial/left atrial appendage thrombus was detected. Right Atrium: Right atrial size was normal in size. Pericardium: There is no evidence of pericardial effusion. Mitral Valve: There is a very small density on the A2 segment of the anterior MV leaflet by 3D imaging that likely represents calcified degeneration of the mitral valve leaflets. No obvious shaggy vegetations noted. The mitral valve is degenerative in appearance. Mild mitral valve regurgitation. No evidence of mitral valve stenosis. Tricuspid Valve: The tricuspid valve is normal in structure. Tricuspid valve regurgitation is mild . No evidence of tricuspid stenosis. Aortic Valve: The aortic valve is normal in structure. Aortic  valve regurgitation is not visualized. No aortic stenosis is present. Pulmonic Valve: The pulmonic valve was normal in structure. Pulmonic valve regurgitation is trivial. No evidence of pulmonic stenosis. Aorta: The aortic root is normal in size and structure. There is moderate (Grade III) layered plaque involving the transverse aorta, ascending aorta and descending aorta. Venous: The inferior vena cava is normal in size with greater than 50% respiratory variability, suggesting right atrial pressure of 3 mmHg. IAS/Shunts: No atrial level shunt detected by color flow Doppler. Fransico Him MD Electronically signed by Fransico Him MD Signature Date/Time: 11/25/2020/1:15:56 PM    Final      Assessment/Plan: Hx of complicated MRSA bacteremia on 4/20 now (negative TEE on 4/25) now readmitted with multifocal pneumonia = presumed septic emboli from right sided endocarditis though the repeat TEE is negative.   - she is technically on day 27 of treatment - will plan to treat for a total of 42 day course. The remaining course should be with vancomycin. If unable to tolerate, then can convert to ceftaroline - consider imaging of spine to see if  signs of discitis or nidus of infection  Solitary kidney = tolerating current dosing of vancomycin  Asymptomatic bacturia = received 3 days of treatment. No need for further gram negative coverage  [facility] Cell: [phone] Pager: [phone]  11/25/2020, 3:32 PM

## 2020-11-25 NOTE — Plan of Care (Signed)
  Problem: Clinical Measurements: Goal: Diagnostic test results will improve Outcome: Progressing   

## 2020-11-25 NOTE — Interval H&P Note (Signed)
History and Physical Interval Note:  11/25/2020 8:10 AM  [name] [name]  has presented today for surgery, with the diagnosis of BACTEREMIA.  The various methods of treatment have been discussed with the patient and family. After consideration of risks, benefits and other options for treatment, the patient has consented to  Procedure(s): TRANSESOPHAGEAL ECHOCARDIOGRAM (TEE) (N/A) as a surgical intervention.  The patient's history has been reviewed, patient examined, no change in status, stable for surgery.  I have reviewed the patient's chart and labs.  Questions were answered to the patient's satisfaction.     Fransico Him

## 2020-11-25 NOTE — Progress Notes (Signed)
  Echocardiogram Echocardiogram Transesophageal has been performed.  Bobbye Charleston 11/25/2020, 9:35 AM

## 2020-11-25 NOTE — CV Procedure (Signed)
    PROCEDURE NOTE:  Procedure:  Transesophageal echocardiogram Operator:  Fransico Him, MD Indications:  Bacteremia Complications: None  During this procedure the patient is administered a total of Propofol 83 mg and Lidocaine 60mg  to achieve and maintain moderate conscious sedation.  The patient's heart rate, blood pressure, and oxygen saturation are monitored continuously during the procedure by anesthesia.   Results: Normal LV size and function Normal RV size and function Normal RA Normal LA and LA appendage with normal emptying velocity and mild spontaneous echo contrast in the LA body Normal TV with mild TR Normal PV with trivial PR Normal MV with mid MR Normal trileaflet AV Markedly lipomatous interatrial septum with no evidence of shunt by colorflow dopper  Grade 3 layered atherosclerotic plaque in the thoracic and ascending aorta. No evidence of vegetation.   The patient tolerated the procedure well and was transferred back to their room in stable condition.  Signed: Fransico Him, MD [facility]

## 2020-11-25 NOTE — Transfer of Care (Signed)
Immediate Anesthesia Transfer of Care Note  Patient: [name] [name]  Procedure(s) Performed: TRANSESOPHAGEAL ECHOCARDIOGRAM (TEE) (N/A )  Patient Location: Endoscopy Unit  Anesthesia Type:MAC  Level of Consciousness: awake, alert  and drowsy  Airway & Oxygen Therapy: Patient Spontanous Breathing and Patient connected to nasal cannula oxygen  Post-op Assessment: Report given to RN, Post -op Vital signs reviewed and stable and Patient moving all extremities  Post vital signs: Reviewed and stable  Last Vitals:  Vitals Value Taken Time  BP 108/57 11/25/20 0912  Temp    Pulse 101 11/25/20 0912  Resp 20 11/25/20 0912  SpO2 95 % 11/25/20 0912  Vitals shown include unvalidated device data.  Last Pain:  Vitals:   11/25/20 0730  TempSrc: Oral  PainSc:       Patients Stated Pain Goal: 0 ([birth year] 4621)  Complications: No complications documented.

## 2020-11-25 NOTE — Progress Notes (Signed)
[facility] PROGRESS NOTE    Progress Note  [name] [name]  [id number] DOB: Aug 20, 1940 DOA: 11/21/2020 PCP: [name], MD     Brief Narrative:   [name] [name] is an 80 y.o. adult past medical history significant for essential hypertension PVD PMR and congenital solitary kidney, also recently discharged from the hospital on 11/04/2020 for MRSA bacteremia during that hospitalization she underwent a TEE that showed no signs of vegetation ID recommended daptomycin for 4 weeks through her PICC line.  She relates she was woken up from sleep with a cough left-sided chest pain and shortness of breath, she was found by staff to be satting 86% on room air and a temperature of 101.3, placed on 3 L of oxygen saturations improved.  Chest x-ray was concerning for possible left upper lobe infiltrates.  The case was discussed with ID recommended to stop daptomycin and patient was placed on vancomycin Flagyl and cefepime   Assessment/Plan:   Sepsis due to pneumonia [facility]): Blood culture blood cultures have remained negative till date procalcitonin was low yield. Chest x-ray was concerning for left-sided pneumonia.  2D echo showed no vegetation. Further management per ID, awaiting recommendations. She has bilateral lower extremity wound as well as peripheral neuropathy.  Acute respiratory failure with hypoxia secondary to above: Requiring 2 L of oxygen slowly weaning off her oxygen likely secondary to #1.  History of MRSA bacteremia: Currently on vancomycin we will continue this for her duration of her therapy. Further management of her antibiotics per ID.  History of sepsis pulmonary septic emboli: Recent CT of the chest was concerning for septic emboli. Blood cultures remain negative x2. TEE done on 11/01/2020 showed no evidence of vegetation, ID was consulted recommended to repeat TEE to rule out right-sided endocarditis.  TEE scheduled for tomorrow 11/25/2020.  Bilateral lower extremity  wounds: Wound care was consulted recommended dressing changes and applying Xeroform gauze to cover blisters.  Macrocytic anemia: Hemoglobin at baseline like around 9 we will continue to monitor intermittently. Her hemoglobin is now slowly going up 2 days ago was 7.8 this morning is 8.1.  Essential hypertension: Blood pressure relatively stable.  PVD: Continue aspirin and Plavix.  Congenital solitary kidney: Creatinine appears to be at baseline we will continue to monitor intermittently.  PMR: She recently follow-up with her rheumatologist Dr. [name] on 11/15/2020 who recommended to continue to hold prednisone.,  Has a follow-up appointment in 12/13/2020 as an outpatient with him.    DVT prophylaxis: lovenox Family Communication:none Status is: Inpatient  Remains inpatient appropriate because:Hemodynamically unstable   Dispo: The patient is from: Home              Anticipated d/c is to: SNF              Patient currently is not medically stable to d/c.   Difficult to place patient No        Code Status:     Code Status Orders  (From admission, onward)         Start     Ordered   11/21/20 1453  Do not attempt resuscitation (DNR)  Continuous       Question Answer Comment  In the event of cardiac or respiratory ARREST Do not call a "code blue"   In the event of cardiac or respiratory ARREST Do not perform Intubation, CPR, defibrillation or ACLS   In the event of cardiac or respiratory ARREST Use medication by any route, position, wound care, and  other measures to relive pain and suffering. May use oxygen, suction and manual treatment of airway obstruction as needed for comfort.      11/21/20 1455        Code Status History    Date Active Date Inactive Code Status Order ID Comments User Context   10/23/2020 1916 11/05/2020 2058 DNR [id number]  [name], DO ED   09/01/2020 1719 09/07/2020 2028 DNR [id number]  [name], [place], DO ED   09/06/2019 0930 09/09/2019 0025 DNR  [id number]  [name], MD ED   04/03/2018 2133 04/09/2018 1656 Full Code [id number]  [name], [place], [place] ED   04/03/2018 1553 04/03/2018 2133 Full Code [id number]  [name], PA-C ED   09/03/2017 0902 09/03/2017 1950 Full Code [id number]  [name], MD ED   09/20/2016 1357 09/21/2016 1626 Full Code [id number]  [name], MD ED   01/31/2016 1130 02/01/2016 1625 Full Code [id number]  [name], MD Inpatient   10/19/2014 2043 10/27/2014 1717 Partial Code [id number]  [name], MD Inpatient   10/19/2014 1957 10/19/2014 2043 Full Code [id number]  [name], MD Inpatient   03/21/2014 1731 03/24/2014 2002 Full Code [id number]  [name], MD Inpatient   10/05/2013 0219 10/07/2013 1832 Full Code [id number]  [name], MD Inpatient   Advance Care Planning Activity    Advance Directive Documentation   Flowsheet Row Most Recent Value  Type of Advance Directive Healthcare Power of Attorney, Living will  Pre-existing out of facility DNR order (yellow form or pink MOST form) --  "MOST" Form in Place? --        IV Access:    Peripheral IV   Procedures and diagnostic studies:   No results found.   Medical Consultants:    None.   Subjective:    [name] [name] relates her pain is improved especially her chest pain.  Objective:    Vitals:   11/25/20 0730 11/25/20 0912 11/25/20 0920 11/25/20 0930  BP: (!) 159/87 (!) 108/57 131/74 132/68  Pulse:  (!) 101 100 96  Resp: 18 20 16 18   Temp: (!) 97.3 F (36.3 C) 97.9 F (36.6 C)    TempSrc: Oral Temporal    SpO2: 99% 95% 99% 97%  Weight:      Height:       SpO2: 97 % O2 Flow Rate (L/min): 3 L/min   Intake/Output Summary (Last 24 hours) at 11/25/2020 1100 Last data filed at 11/25/2020 0912 Gross per 24 hour  Intake 870.87 ml  Output 1400 ml  Net -529.13 ml   Filed Weights   11/21/20 1031  Weight: 63 kg    Exam: General exam: In no acute distress. Respiratory system: Good air movement and clear to  auscultation. Cardiovascular system: S1 & S2 heard, RRR. No JVD. Gastrointestinal system: Abdomen is nondistended, soft and nontender.  Extremities: No pedal edema. Skin: No rashes, lesions or ulcers Psychiatry: Judgement and insight appear normal. Mood & affect appropriate.   Data Reviewed:    Labs: Basic Metabolic Panel: Recent Labs  Lab 11/21/20 1050 11/22/20 0257 11/23/20 0317 11/24/20 0412 11/25/20 0259  NA 138 142 141 139 139  K 4.8 4.5 4.4 4.1 4.3  CL 102 105 103 102 99  CO2 30 31 34* 32 35*  GLUCOSE 107* 84 83 80 92  BUN 9 8 9 8  7*  CREATININE 0.88 0.80 0.83 0.83 0.83  CALCIUM 8.7* 8.8* 9.1 8.9 9.3   GFR Estimated Creatinine Clearance (by  C-G formula based on SCr of 0.83 mg/dL) Female: 44.1 mL/min Female: 54.5 mL/min Liver Function Tests: Recent Labs  Lab 11/21/20 1050  AST 11*  ALT 6  ALKPHOS 66  BILITOT 0.4  PROT 5.9*  ALBUMIN 2.3*   No results for input(s): LIPASE, AMYLASE in the last 168 hours. No results for input(s): AMMONIA in the last 168 hours. Coagulation profile Recent Labs  Lab 11/21/20 1050  INR 1.1   COVID-19 Labs  No results for input(s): DDIMER, FERRITIN, LDH, CRP in the last 72 hours.  Lab Results  Component Value Date   SARSCOV2NAA NEGATIVE 11/21/2020   [place] NEGATIVE 11/04/2020   [place] NEGATIVE 10/27/2020   [place] NEGATIVE 09/06/2020    CBC: Recent Labs  Lab 11/21/20 1050 11/22/20 0257 11/23/20 0317 11/24/20 0412 11/25/20 0259  WBC 7.4 5.3 4.4 5.1 6.0  NEUTROABS 4.7  --  2.2 2.7 3.5  HGB 9.3* 7.8* 8.2* 8.1* 8.6*  HCT 30.1* 24.9* 26.3* 26.2* 27.5*  MCV 104.2* 105.1* 103.5* 104.0* 102.6*  PLT 209 170 172 182 199   Cardiac Enzymes: No results for input(s): CKTOTAL, CKMB, CKMBINDEX, TROPONINI in the last 168 hours. BNP (last 3 results) No results for input(s): PROBNP in the last 8760 hours. CBG: Recent Labs  Lab 11/23/20 0850 11/23/20 1127 11/23/20 1648 11/23/20 2038  GLUCAP 98 91 117* 114*    D-Dimer: No results for input(s): DDIMER in the last 72 hours. Hgb A1c: No results for input(s): HGBA1C in the last 72 hours. Lipid Profile: No results for input(s): CHOL, HDL, LDLCALC, TRIG, CHOLHDL, LDLDIRECT in the last 72 hours. Thyroid function studies: No results for input(s): TSH, T4TOTAL, T3FREE, THYROIDAB in the last 72 hours.  Invalid input(s): FREET3 Anemia work up: No results for input(s): VITAMINB12, FOLATE, FERRITIN, TIBC, IRON, RETICCTPCT in the last 72 hours. Sepsis Labs: Recent Labs  Lab 11/21/20 1050 11/21/20 1318 11/21/20 1650 11/22/20 0257 11/23/20 0317 11/24/20 0412 11/25/20 0259  PROCALCITON  --   --  <0.10  --   --   --   --   WBC 7.4  --   --  5.3 4.4 5.1 6.0  LATICACIDVEN 1.0 0.9  --   --   --   --   --    Microbiology Recent Results (from the past 240 hour(s))  Resp Panel by RT-PCR (Flu A&B, Covid) Nasopharyngeal Swab     Status: None   Collection Time: 11/21/20 10:55 AM   Specimen: Nasopharyngeal Swab; Nasopharyngeal(NP) swabs in vial transport medium  Result Value Ref Range Status   SARS Coronavirus 2 by RT PCR NEGATIVE NEGATIVE Final    Comment: (NOTE) SARS-CoV-2 target nucleic acids are NOT DETECTED.  The SARS-CoV-2 RNA is generally detectable in upper respiratory specimens during the acute phase of infection. The lowest concentration of SARS-CoV-2 viral copies this assay can detect is 138 copies/mL. A negative result does not preclude SARS-Cov-2 infection and should not be used as the sole basis for treatment or other patient management decisions. A negative result may occur with  improper specimen collection/handling, submission of specimen other than nasopharyngeal swab, presence of viral mutation(s) within the areas targeted by this assay, and inadequate number of viral copies(<138 copies/mL). A negative result must be combined with clinical observations, patient history, and epidemiological information. The expected result is  Negative.  Fact Sheet for Patients:  EntrepreneurPulse.com.au  Fact Sheet for Healthcare Providers:  IncredibleEmployment.be  This test is no t yet approved or cleared by the [place] and  has been authorized for detection and/or diagnosis of SARS-CoV-2 by FDA under an Emergency Use Authorization (EUA). This EUA will remain  in effect (meaning this test can be used) for the duration of the COVID-19 declaration under Section 564(b)(1) of the Act, 21 U.S.C.section 360bbb-3(b)(1), unless the authorization is terminated  or revoked sooner.       Influenza A by PCR NEGATIVE NEGATIVE Final   Influenza B by PCR NEGATIVE NEGATIVE Final    Comment: (NOTE) The Xpert Xpress SARS-CoV-2/FLU/RSV plus assay is intended as an aid in the diagnosis of influenza from Nasopharyngeal swab specimens and should not be used as a sole basis for treatment. Nasal washings and aspirates are unacceptable for Xpert Xpress SARS-CoV-2/FLU/RSV testing.  Fact Sheet for Patients: EntrepreneurPulse.com.au  Fact Sheet for Healthcare Providers: IncredibleEmployment.be  This test is not yet approved or cleared by the [place] FDA and has been authorized for detection and/or diagnosis of SARS-CoV-2 by FDA under an Emergency Use Authorization (EUA). This EUA will remain in effect (meaning this test can be used) for the duration of the COVID-19 declaration under Section 564(b)(1) of the Act, 21 U.S.C. section 360bbb-3(b)(1), unless the authorization is terminated or revoked.  Performed at [facility], [place] [address] [place], [place] [address]   Blood Culture (routine x 2)     Status: None (Preliminary result)   Collection Time: 11/21/20 10:55 AM   Specimen: BLOOD RIGHT ARM  Result Value Ref Range Status   Specimen Description BLOOD RIGHT ARM  Final   Special Requests   Final    BOTTLES DRAWN AEROBIC AND ANAEROBIC  Blood Culture adequate volume   Culture   Final    NO GROWTH 4 DAYS Performed at [facility], [place] [address], [place], [place] [address]    Report Status PENDING  Incomplete  Urine culture     Status: Abnormal   Collection Time: 11/21/20 10:55 AM   Specimen: In/Out Cath Urine  Result Value Ref Range Status   Specimen Description IN/OUT CATH URINE  Final   Special Requests   Final    NONE Performed at [facility], [place] [address] [place], [place] [address]    Culture (A)  Final    40,000 COLONIES/mL KLEBSIELLA PNEUMONIAE 40,000 COLONIES/mL ENTEROBACTER CLOACAE    Report Status 11/23/2020 FINAL  Final   Organism ID, Bacteria KLEBSIELLA PNEUMONIAE (A)  Final   Organism ID, Bacteria ENTEROBACTER CLOACAE (A)  Final      Susceptibility   Enterobacter cloacae - MIC*    CEFAZOLIN >=64 RESISTANT Resistant     CEFEPIME <=0.12 SENSITIVE Sensitive     CIPROFLOXACIN <=0.25 SENSITIVE Sensitive     GENTAMICIN <=1 SENSITIVE Sensitive     IMIPENEM <=0.25 SENSITIVE Sensitive     NITROFURANTOIN 64 INTERMEDIATE Intermediate     TRIMETH/SULFA <=20 SENSITIVE Sensitive     PIP/TAZO <=4 SENSITIVE Sensitive     * 40,000 COLONIES/mL ENTEROBACTER CLOACAE   Klebsiella pneumoniae - MIC*    AMPICILLIN >=32 RESISTANT Resistant     CEFAZOLIN <=4 SENSITIVE Sensitive     CEFEPIME <=0.12 SENSITIVE Sensitive     CEFTRIAXONE <=0.25 SENSITIVE Sensitive     CIPROFLOXACIN <=0.25 SENSITIVE Sensitive     GENTAMICIN <=1 SENSITIVE Sensitive     IMIPENEM <=0.25 SENSITIVE Sensitive     NITROFURANTOIN 128 RESISTANT Resistant     TRIMETH/SULFA <=20 SENSITIVE Sensitive     AMPICILLIN/SULBACTAM 8 SENSITIVE Sensitive     PIP/TAZO <=4 SENSITIVE  Sensitive     * 40,000 COLONIES/mL KLEBSIELLA PNEUMONIAE  Blood Culture (routine x 2)     Status: None (Preliminary result)   Collection Time: 11/21/20 11:00 AM   Specimen: BLOOD LEFT FOREARM  Result Value Ref Range Status   Specimen Description BLOOD LEFT FOREARM   Final   Special Requests   Final    BOTTLES DRAWN AEROBIC AND ANAEROBIC Blood Culture adequate volume   Culture   Final    NO GROWTH 4 DAYS Performed at [facility], [place] [address] [place], [place] [address]    Report Status PENDING  Incomplete  MRSA PCR Screening     Status: None   Collection Time: 11/22/20 12:37 AM   Specimen: Nasal Mucosa; Nasopharyngeal  Result Value Ref Range Status   MRSA by PCR NEGATIVE NEGATIVE Final    Comment:        The GeneXpert MRSA Assay (FDA approved for NASAL specimens only), is one component of a comprehensive MRSA colonization surveillance program. It is not intended to diagnose MRSA infection nor to guide or monitor treatment for MRSA infections. Performed at [facility], [place] [address] [place], [place] [address]      Medications:   . aspirin  81 mg Oral Daily  . busPIRone  15 mg Oral TID  . Chlorhexidine Gluconate Cloth  6 each Topical Daily  . clopidogrel  75 mg Oral q morning  . dextromethorphan-guaiFENesin  1 tablet Oral BID  . divalproex  250 mg Oral BID  . timolol  1 drop Both Eyes BID   And  . dorzolamide  1 drop Both Eyes BID  . enoxaparin (LOVENOX) injection  40 mg Subcutaneous Q24H  . gabapentin  200 mg Oral TID  . latanoprost  1 drop Both Eyes QHS  . pantoprazole  40 mg Oral Daily  . sodium chloride flush  10-40 mL Intracatheter Q12H  . sodium chloride flush  3 mL Intravenous Q12H   Continuous Infusions: . vancomycin        LOS: 4 days   Charlynne Cousins  [facility]  11/25/2020, 11:00 AM

## 2020-11-25 NOTE — Anesthesia Postprocedure Evaluation (Signed)
Anesthesia Post Note  Patient: [name] [name]  Procedure(s) Performed: TRANSESOPHAGEAL ECHOCARDIOGRAM (TEE) (N/A )     Patient location during evaluation: Endoscopy Anesthesia Type: General Level of consciousness: awake Pain management: pain level controlled Vital Signs Assessment: post-procedure vital signs reviewed and stable Respiratory status: spontaneous breathing Cardiovascular status: stable Postop Assessment: no apparent nausea or vomiting Anesthetic complications: no   No complications documented.  Last Vitals:  Vitals:   11/25/20 0920 11/25/20 0930  BP: 131/74 132/68  Pulse: 100 96  Resp: 16 18  Temp:    SpO2: 99% 97%    Last Pain:  Vitals:   11/25/20 0930  TempSrc:   PainSc: 0-No pain                 Amarilis Belflower

## 2020-11-25 NOTE — Plan of Care (Signed)
  Problem: Respiratory: Goal: Ability to maintain adequate ventilation will improve Outcome: Progressing Goal: Ability to maintain a clear airway will improve Outcome: Progressing   Problem: Education: Goal: Knowledge of General Education information will improve Description: Including pain rating scale, medication(s)/side effects and non-pharmacologic comfort measures Outcome: Progressing   Problem: Pain Managment: Goal: General experience of comfort will improve Outcome: Progressing   Problem: Safety: Goal: Ability to remain free from injury will improve Outcome: Progressing   Problem: Skin Integrity: Goal: Risk for impaired skin integrity will decrease Outcome: Progressing

## 2020-11-25 NOTE — Progress Notes (Signed)
Pharmacy Antibiotic Note  [name] [name] is a 80 y.o. adult admitted on 11/21/2020 with septic pulmonary emboli/endocarditis relating to recent MRSA bacteremia.  Pharmacy has been consulted for vancomycin dosing.  Vanc peak of 18 and random level 9 ~11h after peak >> AUC 258, below goal.  Plan: Change vancomycin to 500mg  IV Q12H for calculated AUC 515 (goal 400-550).  Height: 5' (152.4 cm) Weight: 63 kg (138 lb 14.2 oz) IBW/kg (Calculated) : 45.5  Temp (24hrs), Avg:98.5 F (36.9 C), Min:98.2 F (36.8 C), Max:98.7 F (37.1 C)  Recent Labs  Lab 11/21/20 1050 11/21/20 1318 11/22/20 0257 11/23/20 0317 11/24/20 0412 11/24/20 1615 11/25/20 0259  WBC 7.4  --  5.3 4.4 5.1  --  6.0  CREATININE 0.88  --  0.80 0.83 0.83  --  0.83  LATICACIDVEN 1.0 0.9  --   --   --   --   --   VANCOPEAK  --   --   --   --   --  18*  --   VANCORANDOM  --   --   --   --   --   --  9    Estimated Creatinine Clearance (by C-G formula based on SCr of 0.83 mg/dL) Female: 44.1 mL/min Female: 54.5 mL/min    Allergies  Allergen Reactions  . Statins   . Gabapentin Itching    Was deleted since patient was taking, but now experiencing itching again.   . Statins Swelling, Rash and Other (See Comments)    Swelling involving tongue; also causes muscle pain    Antimicrobials this admission: vanc 51/15 >> cefepime 5/15 >> 5/17 Flagyl 5/15 >> 5/17  Microbiology results: 5/15 urine (cath)>>40k kleb and enterobacter MRSA neg 5/15 blood>>ngtd  Thank you for allowing pharmacy to be a part of this patient's care.  Wynona Neat, PharmD, BCPS  11/25/2020 4:05 AM

## 2020-11-26 ENCOUNTER — Inpatient Hospital Stay (HOSPITAL_COMMUNITY): Payer: PPO

## 2020-11-26 DIAGNOSIS — J189 Pneumonia, unspecified organism: Secondary | ICD-10-CM | POA: Diagnosis not present

## 2020-11-26 DIAGNOSIS — I76 Septic arterial embolism: Secondary | ICD-10-CM | POA: Diagnosis not present

## 2020-11-26 DIAGNOSIS — M79602 Pain in left arm: Secondary | ICD-10-CM

## 2020-11-26 DIAGNOSIS — A419 Sepsis, unspecified organism: Secondary | ICD-10-CM | POA: Diagnosis not present

## 2020-11-26 DIAGNOSIS — I10 Essential (primary) hypertension: Secondary | ICD-10-CM | POA: Diagnosis not present

## 2020-11-26 LAB — CULTURE, BLOOD (ROUTINE X 2)
Culture: NO GROWTH
Culture: NO GROWTH
Special Requests: ADEQUATE
Special Requests: ADEQUATE

## 2020-11-26 LAB — BASIC METABOLIC PANEL
Anion gap: 4 — ABNORMAL LOW (ref 5–15)
BUN: 8 mg/dL (ref 8–23)
CO2: 34 mmol/L — ABNORMAL HIGH (ref 22–32)
Calcium: 8.8 mg/dL — ABNORMAL LOW (ref 8.9–10.3)
Chloride: 99 mmol/L (ref 98–111)
Creatinine, Ser: 0.75 mg/dL (ref 0.44–1.00)
GFR, Estimated: >60 mL/min (ref >60)
Glucose, Bld: 85 mg/dL (ref 70–99)
Potassium: 3.7 mmol/L (ref 3.5–5.1)
Sodium: 137 mmol/L (ref 135–145)

## 2020-11-26 LAB — CBC WITH DIFFERENTIAL/PLATELET
Abs Immature Granulocytes: 0.04 10*3/uL (ref 0.00–0.07)
Basophils Absolute: 0 10*3/uL (ref 0.0–0.1)
Basophils Relative: 0 %
Eosinophils Absolute: 0.2 10*3/uL (ref 0.0–0.5)
Eosinophils Relative: 4 %
HCT: 26 % — ABNORMAL LOW (ref 36.0–46.0)
Hemoglobin: 8.2 g/dL — ABNORMAL LOW (ref 12.0–15.0)
Immature Granulocytes: 1 %
Lymphocytes Relative: 23 %
Lymphs Abs: 1.3 10*3/uL (ref 0.7–4.0)
MCH: 32.3 pg (ref 26.0–34.0)
MCHC: 31.5 g/dL (ref 30.0–36.0)
MCV: 102.4 fL — ABNORMAL HIGH (ref 80.0–100.0)
Monocytes Absolute: 0.7 10*3/uL (ref 0.1–1.0)
Monocytes Relative: 13 %
Neutro Abs: 3.3 10*3/uL (ref 1.7–7.7)
Neutrophils Relative %: 59 %
Platelets: 203 10*3/uL (ref 150–400)
RBC: 2.54 MIL/uL — ABNORMAL LOW (ref 3.87–5.11)
RDW: 15.4 % (ref 11.5–15.5)
WBC: 5.6 10*3/uL (ref 4.0–10.5)
nRBC: 0 % (ref 0.0–0.2)

## 2020-11-26 NOTE — Progress Notes (Addendum)
[facility] PROGRESS NOTE    Progress Note  [name] [name]  YQM:[id number] DOB: [birth year] DOA: 11/21/2020 PCP: [name], MD     Brief Narrative:   LYAN [name] is an 80 y.o. adult past medical history significant for essential hypertension PVD PMR and congenital solitary kidney, also recently discharged from the hospital on 11/04/2020 for MRSA bacteremia during that hospitalization she underwent a TEE that showed no signs of vegetation ID recommended daptomycin for 4 weeks through her PICC line.  She relates she was woken up from sleep with a cough left-sided chest pain and shortness of breath, she was found by staff to be satting 86% on room air and a temperature of 101.3, placed on 3 L of oxygen saturations improved.  Chest x-ray was concerning for possible left upper lobe infiltrates.  The case was discussed with ID recommended to stop daptomycin and patient was placed on vancomycin Flagyl and cefepime   Assessment/Plan:   Sepsis due to pneumonia [facility]): Blood culture blood cultures have remained negative till date procalcitonin was low yield. Chest x-ray was concerning for left-sided pneumonia.  TEE showed no vegetation. MRI of the back did not show discitis or osteomyelitis. Further management per ID, awaiting recommendations.  Acute respiratory failure with hypoxia secondary to above: Requiring 2 L of oxygen slowly weaning off her oxygen likely secondary to #1.  History of MRSA bacteremia: Currently on vancomycin we will continue this for her duration of her therapy. Further management of her antibiotics per ID. She will probably need an extended course of IV antibiotics.  History of sepsis pulmonary septic emboli: Recent CT of the chest was concerning for septic emboli. Blood cultures remain negative x2. TEE done on 11/01/2020 showed no evidence of vegetation, ID was consulted recommended to repeat TEE to rule out right-sided endocarditis.  TEE scheduled for tomorrow  11/25/2020.  Bilateral lower extremity wounds: Wound care was consulted recommended dressing changes and applying Xeroform gauze to cover blisters.  Macrocytic anemia: Hemoglobin at baseline like around 9 we will continue to monitor intermittently. Her hemoglobin is now slowly going up 2 days ago was 7.8 this morning is 8.1.  Essential hypertension: Blood pressure relatively stable.  PVD: Continue aspirin and Plavix.  Congenital solitary kidney: Creatinine appears to be at baseline we will continue to monitor intermittently.  PMR: She recently follow-up with her rheumatologist Dr. [name] on 11/15/2020 who recommended to continue to hold prednisone.,  Has a follow-up appointment in 12/13/2020 as an outpatient with him.  Left upper extremity swelling and shoulder pain: Get an x-ray of her shoulder to rule out effusion she relates she has a history of gout if there is an effusion we will go ahead and perform thoracocentesis and send for analysis. Her left upper extremity is also swollen we will get a vascular study to rule out DVT.   DVT prophylaxis: lovenox Family Communication:none Status is: Inpatient  Remains inpatient appropriate because:Hemodynamically unstable   Dispo: The patient is from: Home              Anticipated d/c is to: SNF              Patient currently is not medically stable to d/c.   Difficult to place patient No        Code Status:     Code Status Orders  (From admission, onward)         Start     Ordered   11/21/20 1453  Do not  attempt resuscitation (DNR)  Continuous       Question Answer Comment  In the event of cardiac or respiratory ARREST Do not call a "code blue"   In the event of cardiac or respiratory ARREST Do not perform Intubation, CPR, defibrillation or ACLS   In the event of cardiac or respiratory ARREST Use medication by any route, position, wound care, and other measures to relive pain and suffering. May use oxygen, suction and  manual treatment of airway obstruction as needed for comfort.      11/21/20 1455        Code Status History    Date Active Date Inactive Code Status Order ID Comments User Context   10/23/2020 1916 11/05/2020 2058 DNR [id number]  [name], DO ED   09/01/2020 1719 09/07/2020 2028 DNR [id number]  [name], Blanchard, DO ED   09/06/2019 0930 09/09/2019 0025 DNR [id number]  [name], MD ED   04/03/2018 2133 04/09/2018 1656 Full Code [id number]  [name], [place], Bassett ED   04/03/2018 1553 04/03/2018 2133 Full Code [id number]  [name], PA-C ED   09/03/2017 0902 09/03/2017 1950 Full Code [id number]  [name], MD ED   09/20/2016 1357 09/21/2016 1626 Full Code [id number]  [name], MD ED   01/31/2016 1130 02/01/2016 1625 Full Code [id number]  [name], MD Inpatient   10/19/2014 2043 10/27/2014 1717 Partial Code [id number]  [name], MD Inpatient   10/19/2014 1957 10/19/2014 2043 Full Code [id number]  [name], MD Inpatient   03/21/2014 1731 03/24/2014 2002 Full Code [id number]  [name], MD Inpatient   10/05/2013 0219 10/07/2013 1832 Full Code [id number]  [name], MD Inpatient   Advance Care Planning Activity    Advance Directive Documentation   Flowsheet Row Most Recent Value  Type of Advance Directive Healthcare Power of Attorney, Living will  Pre-existing out of facility DNR order (yellow form or pink MOST form) --  "MOST" Form in Place? --        IV Access:    Peripheral IV   Procedures and diagnostic studies:   MR LUMBAR SPINE WO CONTRAST  Result Date: 11/25/2020 CLINICAL DATA:  Back pain EXAM: MRI LUMBAR SPINE WITHOUT CONTRAST TECHNIQUE: Multiplanar, multisequence MR imaging of the lumbar spine was performed. No intravenous contrast was administered. COMPARISON:  Lumbar spine MRI 06/23/2017 FINDINGS: Segmentation:  Standard. Alignment:  There is grade 1 retrolisthesis at L1-2. Vertebrae:  No fracture, evidence of discitis, or bone lesion. Conus  medullaris and cauda equina: Conus extends to the L1 level. Conus and cauda equina appear normal. Paraspinal and other soft tissues: Left kidney is atrophic. Disc levels: T10-L1 levels are imaged only in the sagittal plane. There are small disc bulges at all levels without spinal canal stenosis. L1-L2: Disc bulge with endplate spurring. No spinal canal stenosis. Unchanged moderate bilateral neural foraminal stenosis. L2-L3: Disc desiccation with minimal bulge. Normal facets. No spinal canal stenosis. No neural foraminal stenosis. L3-L4: Disc bulge and endplate spurring. Left lateral recess narrowing without central spinal canal stenosis. Mild right and slightly progressed moderate left neural foraminal stenosis. L4-L5: Disc bulge with mild facet hypertrophy. No spinal canal stenosis. Unchanged moderate bilateral neural foraminal stenosis. L5-S1: Disc height loss without herniation. No spinal canal stenosis. No neural foraminal stenosis. Visualized sacrum: Normal. IMPRESSION: 1. Unchanged moderate bilateral neural foraminal stenosis at L1-L2 and L4-L5. 2. Slight worsening of moderate left L3-4 neural foraminal stenosis. 3. No central spinal canal stenosis. Electronically Signed  By: Ulyses Jarred M.D.   On: 11/25/2020 19:02   ECHO TEE  Result Date: 11/25/2020    TRANSESOPHOGEAL ECHO REPORT   Patient Name:   MARGI MAGOS Santone Date of Exam: 11/25/2020 Medical Rec #:  [id number]   Height:       60.0 in Accession #:    [id number]  Weight:       138.9 lb Date of Birth:  Feb 06, 1941   BSA:          1.599 m Patient Age:    54 years    BP:           159/87 mmHg Patient Gender: F           HR:           107 bpm. Exam Location:  Inpatient Procedure: 3D Echo, Transesophageal Echo, Cardiac Doppler and Color Doppler Indications:     Bacteremia  History:         Patient has prior history of Echocardiogram examinations, most                  recent 11/25/2020. Signs/Symptoms:Bacteremia; Risk                  Factors:Hypertension,  Dyslipidemia and Sleep Apnea. Setic                  emboli.  Sonographer:     Roseanna Rainbow RDCS Referring Phys:  [id number] [place] [facility] Diagnosing Phys: Fransico Him MD PROCEDURE: After discussion of the risks and benefits of a TEE, an informed consent was obtained from the patient. The transesophogeal probe was passed without difficulty through the esophogus of the patient. Imaged were obtained with the patient in a left lateral decubitus position. Local oropharyngeal anesthetic was provided with Cetacaine. Sedation performed by different physician. The patient was monitored while under deep sedation. Anesthestetic sedation was provided intravenously by Anesthesiology: 83mg  of Propofol, 60mg  of Lidocaine. The patient's vital signs; including heart rate, blood pressure, and oxygen saturation; remained stable throughout the procedure. The patient developed no complications during the procedure. IMPRESSIONS  1. Left ventricular ejection fraction, by estimation, is 60 to 65%. The left ventricle has normal function. The left ventricle has no regional wall motion abnormalities.  2. Right ventricular systolic function is normal. The right ventricular size is normal.  3. No left atrial/left atrial appendage thrombus was detected.  4. The mitral valve is degenerative. There is a very small density on the A2 segment of the anterior MV leaflet by 3D imaging that likely represents calcified degeneration of the mitral valve leaflets. No obvious shaggy vegetations noted. Mild mitral valve regurgitation. No evidence of mitral stenosis.  5. The aortic valve is normal in structure. Aortic valve regurgitation is not visualized. No aortic stenosis is present.  6. There is Moderate (Grade III) layered plaque involving the transverse aorta, ascending aorta and descending aorta.  7. The inferior vena cava is normal in size with greater than 50% respiratory variability, suggesting right atrial pressure of 3 mmHg.  Conclusion(s)/Recommendation(s): Normal biventricular function without evidence of hemodynamically significant valvular heart disease. FINDINGS  Left Ventricle: Left ventricular ejection fraction, by estimation, is 60 to 65%. The left ventricle has normal function. The left ventricle has no regional wall motion abnormalities. The left ventricular internal cavity size was normal in size. There is  no left ventricular hypertrophy. Right Ventricle: The right ventricular size is normal. No increase in right ventricular wall thickness. Right ventricular systolic  function is normal. Left Atrium: Left atrial size was normal in size. Spontaneous echo contrast was present. No left atrial/left atrial appendage thrombus was detected. Right Atrium: Right atrial size was normal in size. Pericardium: There is no evidence of pericardial effusion. Mitral Valve: There is a very small density on the A2 segment of the anterior MV leaflet by 3D imaging that likely represents calcified degeneration of the mitral valve leaflets. No obvious shaggy vegetations noted. The mitral valve is degenerative in appearance. Mild mitral valve regurgitation. No evidence of mitral valve stenosis. Tricuspid Valve: The tricuspid valve is normal in structure. Tricuspid valve regurgitation is mild . No evidence of tricuspid stenosis. Aortic Valve: The aortic valve is normal in structure. Aortic valve regurgitation is not visualized. No aortic stenosis is present. Pulmonic Valve: The pulmonic valve was normal in structure. Pulmonic valve regurgitation is trivial. No evidence of pulmonic stenosis. Aorta: The aortic root is normal in size and structure. There is moderate (Grade III) layered plaque involving the transverse aorta, ascending aorta and descending aorta. Venous: The inferior vena cava is normal in size with greater than 50% respiratory variability, suggesting right atrial pressure of 3 mmHg. IAS/Shunts: No atrial level shunt detected by color flow  Doppler. Fransico Him MD Electronically signed by Fransico Him MD Signature Date/Time: 11/25/2020/1:15:56 PM    Final      Medical Consultants:    None.   Subjective:    [name] has she is complaining of new left shoulder pain Objective:    Vitals:   11/25/20 1625 11/25/20 2122 11/26/20 0453 11/26/20 0850  BP: 129/69 122/64 130/71 (!) 152/75  Pulse: 89 93 87 100  Resp: 17 15 15 18   Temp: 98.7 F (37.1 C) 98.4 F (36.9 C) 98.3 F (36.8 C) 98.9 F (37.2 C)  TempSrc: Oral   Oral  SpO2: 99% 94% 95% 95%  Weight:  63.2 kg    Height:       SpO2: 95 % O2 Flow Rate (L/min): 2 L/min   Intake/Output Summary (Last 24 hours) at 11/26/2020 0922 Last data filed at 11/26/2020 0850 Gross per 24 hour  Intake 1120 ml  Output 1500 ml  Net -380 ml   Filed Weights   11/21/20 1031 11/25/20 2122  Weight: 63 kg 63.2 kg    Exam: General exam: In no acute distress. Respiratory system: Good air movement and clear to auscultation. Cardiovascular system: S1 & S2 heard, RRR. No JVD. Gastrointestinal system: Abdomen is nondistended, soft and nontender.  Extremities: No pedal edema. Skin: No rashes, lesions or ulcers Psychiatry: Judgement and insight appear normal. Mood & affect appropriate.   Data Reviewed:    Labs: Basic Metabolic Panel: Recent Labs  Lab 11/22/20 0257 11/23/20 0317 11/24/20 0412 11/25/20 0259 11/26/20 0334  NA 142 141 139 139 137  K 4.5 4.4 4.1 4.3 3.7  CL 105 103 102 99 99  CO2 31 34* 32 35* 34*  GLUCOSE 84 83 80 92 85  BUN 8 9 8  7* 8  CREATININE 0.80 0.83 0.83 0.83 0.75  CALCIUM 8.8* 9.1 8.9 9.3 8.8*   GFR Estimated Creatinine Clearance (by C-G formula based on SCr of 0.75 mg/dL) Female: 45.8 mL/min Female: 56.6 mL/min Liver Function Tests: Recent Labs  Lab 11/21/20 1050  AST 11*  ALT 6  ALKPHOS 66  BILITOT 0.4  PROT 5.9*  ALBUMIN 2.3*   No results for input(s): LIPASE, AMYLASE in the last 168 hours. No results for input(s): AMMONIA in  the last 168 hours. Coagulation profile Recent Labs  Lab 11/21/20 1050  INR 1.1   COVID-19 Labs  No results for input(s): DDIMER, FERRITIN, LDH, CRP in the last 72 hours.  Lab Results  Component Value Date   SARSCOV2NAA NEGATIVE 11/21/2020   [place] NEGATIVE 11/04/2020   [place] NEGATIVE 10/27/2020   [place] NEGATIVE 09/06/2020    CBC: Recent Labs  Lab 11/21/20 1050 11/22/20 0257 11/23/20 0317 11/24/20 0412 11/25/20 0259 11/26/20 0334  WBC 7.4 5.3 4.4 5.1 6.0 5.6  NEUTROABS 4.7  --  2.2 2.7 3.5 3.3  HGB 9.3* 7.8* 8.2* 8.1* 8.6* 8.2*  HCT 30.1* 24.9* 26.3* 26.2* 27.5* 26.0*  MCV 104.2* 105.1* 103.5* 104.0* 102.6* 102.4*  PLT 209 170 172 182 199 203   Cardiac Enzymes: No results for input(s): CKTOTAL, CKMB, CKMBINDEX, TROPONINI in the last 168 hours. BNP (last 3 results) No results for input(s): PROBNP in the last 8760 hours. CBG: Recent Labs  Lab 11/23/20 0850 11/23/20 1127 11/23/20 1648 11/23/20 2038  GLUCAP 98 91 117* 114*   D-Dimer: No results for input(s): DDIMER in the last 72 hours. Hgb A1c: No results for input(s): HGBA1C in the last 72 hours. Lipid Profile: No results for input(s): CHOL, HDL, LDLCALC, TRIG, CHOLHDL, LDLDIRECT in the last 72 hours. Thyroid function studies: No results for input(s): TSH, T4TOTAL, T3FREE, THYROIDAB in the last 72 hours.  Invalid input(s): FREET3 Anemia work up: No results for input(s): VITAMINB12, FOLATE, FERRITIN, TIBC, IRON, RETICCTPCT in the last 72 hours. Sepsis Labs: Recent Labs  Lab 11/21/20 1050 11/21/20 1318 11/21/20 1650 11/22/20 0257 11/23/20 0317 11/24/20 0412 11/25/20 0259 11/26/20 0334  PROCALCITON  --   --  <0.10  --   --   --   --   --   WBC 7.4  --   --    < > 4.4 5.1 6.0 5.6  LATICACIDVEN 1.0 0.9  --   --   --   --   --   --    < > = values in this interval not displayed.   Microbiology Recent Results (from the past 240 hour(s))  Resp Panel by RT-PCR (Flu A&B, Covid)  Nasopharyngeal Swab     Status: None   Collection Time: 11/21/20 10:55 AM   Specimen: Nasopharyngeal Swab; Nasopharyngeal(NP) swabs in vial transport medium  Result Value Ref Range Status   SARS Coronavirus 2 by RT PCR NEGATIVE NEGATIVE Final    Comment: (NOTE) SARS-CoV-2 target nucleic acids are NOT DETECTED.  The SARS-CoV-2 RNA is generally detectable in upper respiratory specimens during the acute phase of infection. The lowest concentration of SARS-CoV-2 viral copies this assay can detect is 138 copies/mL. A negative result does not preclude SARS-Cov-2 infection and should not be used as the sole basis for treatment or other patient management decisions. A negative result may occur with  improper specimen collection/handling, submission of specimen other than nasopharyngeal swab, presence of viral mutation(s) within the areas targeted by this assay, and inadequate number of viral copies(<138 copies/mL). A negative result must be combined with clinical observations, patient history, and epidemiological information. The expected result is Negative.  Fact Sheet for Patients:  EntrepreneurPulse.com.au  Fact Sheet for Healthcare Providers:  IncredibleEmployment.be  This test is no t yet approved or cleared by the [place] FDA and  has been authorized for detection and/or diagnosis of SARS-CoV-2 by FDA under an Emergency Use Authorization (EUA). This EUA will remain  in effect (meaning this test can be  used) for the duration of the COVID-19 declaration under Section 564(b)(1) of the Act, 21 U.S.C.section 360bbb-3(b)(1), unless the authorization is terminated  or revoked sooner.       Influenza A by PCR NEGATIVE NEGATIVE Final   Influenza B by PCR NEGATIVE NEGATIVE Final    Comment: (NOTE) The Xpert Xpress SARS-CoV-2/FLU/RSV plus assay is intended as an aid in the diagnosis of influenza from Nasopharyngeal swab specimens and should not be  used as a sole basis for treatment. Nasal washings and aspirates are unacceptable for Xpert Xpress SARS-CoV-2/FLU/RSV testing.  Fact Sheet for Patients: EntrepreneurPulse.com.au  Fact Sheet for Healthcare Providers: IncredibleEmployment.be  This test is not yet approved or cleared by the [place] FDA and has been authorized for detection and/or diagnosis of SARS-CoV-2 by FDA under an Emergency Use Authorization (EUA). This EUA will remain in effect (meaning this test can be used) for the duration of the COVID-19 declaration under Section 564(b)(1) of the Act, 21 U.S.C. section 360bbb-3(b)(1), unless the authorization is terminated or revoked.  Performed at [facility], [place] [address] [place], [place] [address]   Blood Culture (routine x 2)     Status: None   Collection Time: 11/21/20 10:55 AM   Specimen: BLOOD RIGHT ARM  Result Value Ref Range Status   Specimen Description BLOOD RIGHT ARM  Final   Special Requests   Final    BOTTLES DRAWN AEROBIC AND ANAEROBIC Blood Culture adequate volume   Culture   Final    NO GROWTH 5 DAYS Performed at [facility], [place] [address], [place], [place] [address]    Report Status 11/26/2020 FINAL  Final  Urine culture     Status: Abnormal   Collection Time: 11/21/20 10:55 AM   Specimen: In/Out Cath Urine  Result Value Ref Range Status   Specimen Description IN/OUT CATH URINE  Final   Special Requests   Final    NONE Performed at [facility], [place] [address], [place], [place] [address]    Culture (A)  Final    40,000 COLONIES/mL KLEBSIELLA PNEUMONIAE 40,000 COLONIES/mL ENTEROBACTER CLOACAE    Report Status 11/23/2020 FINAL  Final   Organism ID, Bacteria KLEBSIELLA PNEUMONIAE (A)  Final   Organism ID, Bacteria ENTEROBACTER CLOACAE (A)  Final      Susceptibility   Enterobacter cloacae - MIC*    CEFAZOLIN >=64 RESISTANT Resistant     CEFEPIME <=0.12 SENSITIVE Sensitive      CIPROFLOXACIN <=0.25 SENSITIVE Sensitive     GENTAMICIN <=1 SENSITIVE Sensitive     IMIPENEM <=0.25 SENSITIVE Sensitive     NITROFURANTOIN 64 INTERMEDIATE Intermediate     TRIMETH/SULFA <=20 SENSITIVE Sensitive     PIP/TAZO <=4 SENSITIVE Sensitive     * 40,000 COLONIES/mL ENTEROBACTER CLOACAE   Klebsiella pneumoniae - MIC*    AMPICILLIN >=32 RESISTANT Resistant     CEFAZOLIN <=4 SENSITIVE Sensitive     CEFEPIME <=0.12 SENSITIVE Sensitive     CEFTRIAXONE <=0.25 SENSITIVE Sensitive     CIPROFLOXACIN <=0.25 SENSITIVE Sensitive     GENTAMICIN <=1 SENSITIVE Sensitive     IMIPENEM <=0.25 SENSITIVE Sensitive     NITROFURANTOIN 128 RESISTANT Resistant     TRIMETH/SULFA <=20 SENSITIVE Sensitive     AMPICILLIN/SULBACTAM 8 SENSITIVE Sensitive     PIP/TAZO <=4 SENSITIVE Sensitive     * 40,000 COLONIES/mL KLEBSIELLA PNEUMONIAE  Blood Culture (routine x 2)     Status: None   Collection Time: 11/21/20 11:00 AM  Specimen: BLOOD LEFT FOREARM  Result Value Ref Range Status   Specimen Description BLOOD LEFT FOREARM  Final   Special Requests   Final    BOTTLES DRAWN AEROBIC AND ANAEROBIC Blood Culture adequate volume   Culture   Final    NO GROWTH 5 DAYS Performed at [facility], [address] [place], [place] [address]    Report Status 11/26/2020 FINAL  Final  MRSA PCR Screening     Status: None   Collection Time: 11/22/20 12:37 AM   Specimen: Nasal Mucosa; Nasopharyngeal  Result Value Ref Range Status   MRSA by PCR NEGATIVE NEGATIVE Final    Comment:        The GeneXpert MRSA Assay (FDA approved for NASAL specimens only), is one component of a comprehensive MRSA colonization surveillance program. It is not intended to diagnose MRSA infection nor to guide or monitor treatment for MRSA infections. Performed at [facility], [place] [address] [place], [place] [address]      Medications:   . aspirin  81 mg Oral Daily  . busPIRone  15 mg Oral TID  . Chlorhexidine  Gluconate Cloth  6 each Topical Daily  . clopidogrel  75 mg Oral q morning  . dextromethorphan-guaiFENesin  1 tablet Oral BID  . divalproex  250 mg Oral BID  . timolol  1 drop Both Eyes BID   And  . dorzolamide  1 drop Both Eyes BID  . enoxaparin (LOVENOX) injection  40 mg Subcutaneous Q24H  . gabapentin  200 mg Oral TID  . latanoprost  1 drop Both Eyes QHS  . pantoprazole  40 mg Oral Daily  . sodium chloride flush  10-40 mL Intracatheter Q12H  . sodium chloride flush  3 mL Intravenous Q12H   Continuous Infusions: . vancomycin 500 mg (11/26/20 0423)      LOS: 5 days   [facility]  11/26/2020, 9:22 AM

## 2020-11-26 NOTE — Progress Notes (Signed)
Left upper extremity venous duplex has been completed. Preliminary results can be found in CV Proc through chart review.   11/26/20 12:32 PM Carlos Levering RVT

## 2020-11-26 NOTE — Progress Notes (Signed)
[place] for Infectious Disease    Date of Admission:  11/21/2020   Total days of antibiotics 28           ID: [name] [name] is a 80 y.o. adult with   Principal Problem:   Sepsis due to pneumonia [facility]) Active Problems:   Polymyalgia rheumatica ([place])   Peripheral vascular disease ([place])   Essential hypertension   Septic embolism ([place])   DNR (do not resuscitate)    Subjective: Afebrile. She reports that her left arm having pain.  Medications:  . aspirin  81 mg Oral Daily  . busPIRone  15 mg Oral TID  . Chlorhexidine Gluconate Cloth  6 each Topical Daily  . clopidogrel  75 mg Oral q morning  . dextromethorphan-guaiFENesin  1 tablet Oral BID  . divalproex  250 mg Oral BID  . timolol  1 drop Both Eyes BID   And  . dorzolamide  1 drop Both Eyes BID  . enoxaparin (LOVENOX) injection  40 mg Subcutaneous Q24H  . gabapentin  200 mg Oral TID  . latanoprost  1 drop Both Eyes QHS  . pantoprazole  40 mg Oral Daily  . sodium chloride flush  10-40 mL Intracatheter Q12H  . sodium chloride flush  3 mL Intravenous Q12H    Objective: Vital signs in last 24 hours: Temp:  [98.3 F (36.8 C)-98.9 F (37.2 C)] 98.9 F (37.2 C) (05/20 1026) Pulse Rate:  [87-100] 96 (05/20 1026) Resp:  [15-18] 16 (05/20 1026) BP: (122-155)/(64-79) 155/79 (05/20 1026) SpO2:  [94 %-99 %] 94 % (05/20 1026) Weight:  [63.2 kg] 63.2 kg (05/19 2122) Physical Exam  Constitutional:  oriented to person, place, and time. appears her stated age and well-nourished. No distress.  HENT: [place]/AT, PERRLA, no scleral icterus Mouth/Throat: Oropharynx is clear and moist. No oropharyngeal exudate.  Cardiovascular: Normal rate, regular rhythm and normal heart sounds. Exam reveals no gallop and no friction rub.  No murmur heard.  Pulmonary/Chest: Effort normal and breath sounds normal. No respiratory distress.  has no wheezes.  Neck = supple, no nuchal rigidity Abdominal: Soft. Bowel sounds are normal.  exhibits no  distension. There is no tenderness.  Lymphadenopathy: no cervical adenopathy. No axillary adenopathy Neurological: alert and oriented to person, place, and time.  Skin: Skin is warm and dry. No rash noted. No erythema.  Psychiatric: a normal mood and affect.  behavior is normal.    Lab Results Recent Labs    11/25/20 0259 11/26/20 0334  WBC 6.0 5.6  HGB 8.6* 8.2*  HCT 27.5* 26.0*  NA 139 137  K 4.3 3.7  CL 99 99  CO2 35* 34*  BUN 7* 8  CREATININE 0.83 0.75    Microbiology: reviewed Studies/Results: MR LUMBAR SPINE WO CONTRAST  Result Date: 11/25/2020 CLINICAL DATA:  Back pain EXAM: MRI LUMBAR SPINE WITHOUT CONTRAST TECHNIQUE: Multiplanar, multisequence MR imaging of the lumbar spine was performed. No intravenous contrast was administered. COMPARISON:  Lumbar spine MRI 06/23/2017 FINDINGS: Segmentation:  Standard. Alignment:  There is grade 1 retrolisthesis at L1-2. Vertebrae:  No fracture, evidence of discitis, or bone lesion. Conus medullaris and cauda equina: Conus extends to the L1 level. Conus and cauda equina appear normal. Paraspinal and other soft tissues: Left kidney is atrophic. Disc levels: T10-L1 levels are imaged only in the sagittal plane. There are small disc bulges at all levels without spinal canal stenosis. L1-L2: Disc bulge with endplate spurring. No spinal canal stenosis. Unchanged moderate bilateral  neural foraminal stenosis. L2-L3: Disc desiccation with minimal bulge. Normal facets. No spinal canal stenosis. No neural foraminal stenosis. L3-L4: Disc bulge and endplate spurring. Left lateral recess narrowing without central spinal canal stenosis. Mild right and slightly progressed moderate left neural foraminal stenosis. L4-L5: Disc bulge with mild facet hypertrophy. No spinal canal stenosis. Unchanged moderate bilateral neural foraminal stenosis. L5-S1: Disc height loss without herniation. No spinal canal stenosis. No neural foraminal stenosis. Visualized sacrum:  Normal. IMPRESSION: 1. Unchanged moderate bilateral neural foraminal stenosis at L1-L2 and L4-L5. 2. Slight worsening of moderate left L3-4 neural foraminal stenosis. 3. No central spinal canal stenosis. Electronically Signed   By: Ulyses Jarred M.D.   On: 11/25/2020 19:02   DG Shoulder Left  Result Date: 11/26/2020 CLINICAL DATA:  Pain and limited range of motion.  No recent trauma. EXAM: LEFT SHOULDER - 2+ VIEW COMPARISON:  None FINDINGS: Mild and expected for age degenerative changes of the undersurface of the acromion and humeral head. No acute fracture or dislocation. Cervical spine fixation. Visualized portion of the left hemithorax is normal. IMPRESSION: No acute osseous abnormality. Electronically Signed   By: Abigail Miyamoto M.D.   On: 11/26/2020 13:19   ECHO TEE  Result Date: 11/25/2020    TRANSESOPHOGEAL ECHO REPORT   Patient Name:   [name] [name] Date of Exam: 11/25/2020 Medical Rec #:  [id number]   Height:       60.0 in Accession #:    [id number]  Weight:       138.9 lb Date of Birth:  09-19-1940   BSA:          1.599 m Patient Age:    37 years    BP:           159/87 mmHg Patient Gender: F           HR:           107 bpm. Exam Location:  Inpatient Procedure: 3D Echo, Transesophageal Echo, Cardiac Doppler and Color Doppler Indications:     Bacteremia  History:         Patient has prior history of Echocardiogram examinations, most                  recent 11/25/2020. Signs/Symptoms:Bacteremia; Risk                  Factors:Hypertension, Dyslipidemia and Sleep Apnea. Setic                  emboli.  Sonographer:     Roseanna Rainbow RDCS Referring Phys:  [id number] Sebastian DUKE Diagnosing Phys: Fransico Him MD PROCEDURE: After discussion of the risks and benefits of a TEE, an informed consent was obtained from the patient. The transesophogeal probe was passed without difficulty through the esophogus of the patient. Imaged were obtained with the patient in a left lateral decubitus position. Local oropharyngeal  anesthetic was provided with Cetacaine. Sedation performed by different physician. The patient was monitored while under deep sedation. Anesthestetic sedation was provided intravenously by Anesthesiology: 83mg  of Propofol, 60mg  of Lidocaine. The patient's vital signs; including heart rate, blood pressure, and oxygen saturation; remained stable throughout the procedure. The patient developed no complications during the procedure. IMPRESSIONS  1. Left ventricular ejection fraction, by estimation, is 60 to 65%. The left ventricle has normal function. The left ventricle has no regional wall motion abnormalities.  2. Right ventricular systolic function is normal. The right ventricular size is normal.  3. No  left atrial/left atrial appendage thrombus was detected.  4. The mitral valve is degenerative. There is a very small density on the A2 segment of the anterior MV leaflet by 3D imaging that likely represents calcified degeneration of the mitral valve leaflets. No obvious shaggy vegetations noted. Mild mitral valve regurgitation. No evidence of mitral stenosis.  5. The aortic valve is normal in structure. Aortic valve regurgitation is not visualized. No aortic stenosis is present.  6. There is Moderate (Grade III) layered plaque involving the transverse aorta, ascending aorta and descending aorta.  7. The inferior vena cava is normal in size with greater than 50% respiratory variability, suggesting right atrial pressure of 3 mmHg. Conclusion(s)/Recommendation(s): Normal biventricular function without evidence of hemodynamically significant valvular heart disease. FINDINGS  Left Ventricle: Left ventricular ejection fraction, by estimation, is 60 to 65%. The left ventricle has normal function. The left ventricle has no regional wall motion abnormalities. The left ventricular internal cavity size was normal in size. There is  no left ventricular hypertrophy. Right Ventricle: The right ventricular size is normal. No  increase in right ventricular wall thickness. Right ventricular systolic function is normal. Left Atrium: Left atrial size was normal in size. Spontaneous echo contrast was present. No left atrial/left atrial appendage thrombus was detected. Right Atrium: Right atrial size was normal in size. Pericardium: There is no evidence of pericardial effusion. Mitral Valve: There is a very small density on the A2 segment of the anterior MV leaflet by 3D imaging that likely represents calcified degeneration of the mitral valve leaflets. No obvious shaggy vegetations noted. The mitral valve is degenerative in appearance. Mild mitral valve regurgitation. No evidence of mitral valve stenosis. Tricuspid Valve: The tricuspid valve is normal in structure. Tricuspid valve regurgitation is mild . No evidence of tricuspid stenosis. Aortic Valve: The aortic valve is normal in structure. Aortic valve regurgitation is not visualized. No aortic stenosis is present. Pulmonic Valve: The pulmonic valve was normal in structure. Pulmonic valve regurgitation is trivial. No evidence of pulmonic stenosis. Aorta: The aortic root is normal in size and structure. There is moderate (Grade III) layered plaque involving the transverse aorta, ascending aorta and descending aorta. Venous: The inferior vena cava is normal in size with greater than 50% respiratory variability, suggesting right atrial pressure of 3 mmHg. IAS/Shunts: No atrial level shunt detected by color flow Doppler. Fransico Him MD Electronically signed by Fransico Him MD Signature Date/Time: 11/25/2020/1:15:56 PM    Final    VAS [place] UPPER EXTREMITY VENOUS DUPLEX  Result Date: 11/26/2020 UPPER VENOUS STUDY  Patient Name:  CHRISTIANN HAGERTY Reicher  Date of Exam:   11/26/2020 Medical Rec #: [id number]    Accession #:    [id number] Date of Birth: Jan 05, 1941    Patient Gender: F Patient Age:   56Y Exam Location:  [facility] Procedure:      VAS [place] UPPER EXTREMITY VENOUS DUPLEX Referring Phys: [id number]  ABRAHAM FELIZ ORTIZ --------------------------------------------------------------------------------  Indications: Pain Risk Factors: None identified. Limitations: Poor ultrasound/tissue interface. Comparison Study: No prior studies. Performing Technologist: Oliver Hum RVT  Examination Guidelines: A complete evaluation includes B-mode imaging, spectral Doppler, color Doppler, and power Doppler as needed of all accessible portions of each vessel. Bilateral testing is considered an integral part of a complete examination. Limited examinations for reoccurring indications may be performed as noted.  Right Findings: +----------+------------+---------+-----------+----------+-------+ RIGHT     CompressiblePhasicitySpontaneousPropertiesSummary +----------+------------+---------+-----------+----------+-------+ Subclavian    Full       Yes  Yes                      +----------+------------+---------+-----------+----------+-------+  Left Findings: +----------+------------+---------+-----------+----------+-------+ LEFT      CompressiblePhasicitySpontaneousPropertiesSummary +----------+------------+---------+-----------+----------+-------+ IJV           Full       Yes       Yes                      +----------+------------+---------+-----------+----------+-------+ Subclavian    Full       Yes       Yes                      +----------+------------+---------+-----------+----------+-------+ Axillary      Full       Yes       Yes                      +----------+------------+---------+-----------+----------+-------+ Brachial      Full       Yes       Yes                      +----------+------------+---------+-----------+----------+-------+ Radial        Full                                          +----------+------------+---------+-----------+----------+-------+ Ulnar         Full                                           +----------+------------+---------+-----------+----------+-------+ Cephalic      Full                                          +----------+------------+---------+-----------+----------+-------+ Basilic       Full                                          +----------+------------+---------+-----------+----------+-------+  Summary:  Right: No evidence of thrombosis in the subclavian.  Left: No evidence of deep vein thrombosis in the upper extremity. No evidence of superficial vein thrombosis in the upper extremity.  *See table(s) above for measurements and observations.    Preliminary      Assessment/Plan: Left arm pain = as discussed with dr Aileen Fass - plan for imaging to rule out dvt. And also plain xray to see if effusion  Multifocal pneumonia thought to related to recent mrsa bacteremia = plan to treat with vancomycin. Will finish out the rest of her course of IV with vancomycin. No other nidus of infection found since back mri is negative for discitis  Complicated mrsa bacteremia with pneumonia (presumed right sided endocarditis ) = currently on day 28 of 42.  Will discharge to snf on vancomycin through 6/3. Needs twice a week cr when discharged, and weekly vanco trough. Please have picc line removed after iv abtx is finished  Will sign off. Will see back in the ID clinic in early [place] for Infectious  Diseases Cell: [phone] Pager: [phone]  11/26/2020, 1:26 PM

## 2020-11-26 NOTE — Progress Notes (Signed)
PHARMACY CONSULT NOTE FOR:  OUTPATIENT  PARENTERAL ANTIBIOTIC THERAPY (OPAT)  Indication: MRSA bacteremia Regimen: Vancomycin 500 mg q12h End date: 12/10/20  IV antibiotic discharge orders are pended. To discharging provider:  please sign these orders via discharge navigator, Select New Orders & click on the button choice - Manage This Unsigned Work.     Thank you for allowing pharmacy to be a part of this patient's care.  Fara Olden, PharmD PGY-1 Pharmacy Resident 11/26/2020 1:44 PM Please see AMION for all pharmacy numbers

## 2020-11-27 DIAGNOSIS — J189 Pneumonia, unspecified organism: Secondary | ICD-10-CM | POA: Diagnosis not present

## 2020-11-27 DIAGNOSIS — A419 Sepsis, unspecified organism: Secondary | ICD-10-CM | POA: Diagnosis not present

## 2020-11-27 DIAGNOSIS — Z66 Do not resuscitate: Secondary | ICD-10-CM | POA: Diagnosis not present

## 2020-11-27 DIAGNOSIS — I10 Essential (primary) hypertension: Secondary | ICD-10-CM | POA: Diagnosis not present

## 2020-11-27 LAB — CBC WITH DIFFERENTIAL/PLATELET
Abs Immature Granulocytes: 0.05 10*3/uL (ref 0.00–0.07)
Basophils Absolute: 0 10*3/uL (ref 0.0–0.1)
Basophils Relative: 0 %
Eosinophils Absolute: 0.2 10*3/uL (ref 0.0–0.5)
Eosinophils Relative: 2 %
HCT: 24.1 % — ABNORMAL LOW (ref 36.0–46.0)
Hemoglobin: 7.9 g/dL — ABNORMAL LOW (ref 12.0–15.0)
Immature Granulocytes: 1 %
Lymphocytes Relative: 24 %
Lymphs Abs: 1.6 10*3/uL (ref 0.7–4.0)
MCH: 32.1 pg (ref 26.0–34.0)
MCHC: 32.8 g/dL (ref 30.0–36.0)
MCV: 98 fL (ref 80.0–100.0)
Monocytes Absolute: 0.8 10*3/uL (ref 0.1–1.0)
Monocytes Relative: 12 %
Neutro Abs: 4.1 10*3/uL (ref 1.7–7.7)
Neutrophils Relative %: 61 %
Platelets: 222 10*3/uL (ref 150–400)
RBC: 2.46 MIL/uL — ABNORMAL LOW (ref 3.87–5.11)
RDW: 15.3 % (ref 11.5–15.5)
WBC: 6.7 10*3/uL (ref 4.0–10.5)
nRBC: 0 % (ref 0.0–0.2)

## 2020-11-27 LAB — BASIC METABOLIC PANEL
Anion gap: 4 — ABNORMAL LOW (ref 5–15)
BUN: 7 mg/dL — ABNORMAL LOW (ref 8–23)
CO2: 33 mmol/L — ABNORMAL HIGH (ref 22–32)
Calcium: 8.5 mg/dL — ABNORMAL LOW (ref 8.9–10.3)
Chloride: 99 mmol/L (ref 98–111)
Creatinine, Ser: 0.77 mg/dL (ref 0.44–1.00)
GFR, Estimated: >60 mL/min (ref >60)
Glucose, Bld: 92 mg/dL (ref 70–99)
Potassium: 3.5 mmol/L (ref 3.5–5.1)
Sodium: 136 mmol/L (ref 135–145)

## 2020-11-27 LAB — GLUCOSE, CAPILLARY: Glucose-Capillary: 93 mg/dL (ref 70–99)

## 2020-11-27 NOTE — Progress Notes (Signed)
Orthopedic Tech Progress Note Patient Details:  [name] 11/17/1940 [id number] Added some ice to shoulder Ortho Devices Type of Ortho Device: Shoulder immobilizer Ortho Device/Splint Location: LUE Ortho Device/Splint Interventions: Ordered,Application,Adjustment   Post Interventions Patient Tolerated: Well Instructions Provided: Care of [name] [name] 11/27/2020, 10:40 AM

## 2020-11-27 NOTE — Plan of Care (Signed)
  Problem: Elimination: Goal: Will not experience complications related to bowel motility Outcome: Adequate for Discharge   

## 2020-11-27 NOTE — Progress Notes (Signed)
[facility] PROGRESS NOTE    Progress Note  [name] [name]  [id number] DOB: May 31, 1941 DOA: 11/21/2020 PCP: [name], MD     Brief Narrative:   [name] [name] is an 80 y.o. adult past medical history significant for essential hypertension PVD PMR and congenital solitary kidney, also recently discharged from the hospital on 11/04/2020 for MRSA bacteremia during that hospitalization she underwent a TEE that showed no signs of vegetation ID recommended daptomycin for 4 weeks through her PICC line.  She relates she was woken up from sleep with a cough left-sided chest pain and shortness of breath, she was found by staff to be satting 86% on room air and a temperature of 101.3, placed on 3 L of oxygen saturations improved.  Chest x-ray was concerning for possible left upper lobe infiltrates.  The case was discussed with ID recommended to stop daptomycin and patient was placed on vancomycin Flagyl and cefepime   Assessment/Plan:   Sepsis due to pneumonia [facility]): Blood culture blood cultures have remained negative till date procalcitonin was low yield. Chest x-ray was concerning for left-sided pneumonia.  TEE showed no vegetation. MRI of the back did not show discitis or osteomyelitis. ID recommended to continue IV Vanco for 42 days stop date is 12/11/2019. Physical therapy evaluated the patient and recommended skilled nursing facility.  Acute respiratory failure with hypoxia secondary to above: Requiring 2 L of oxygen slowly weaning off her oxygen likely secondary to #1.  History of MRSA bacteremia: Currently on vancomycin we will continue this for her duration of her therapy. Further management of her antibiotics per ID. ID recommended IV vancomycin for 42 days.[name]  History of sepsis pulmonary septic emboli: Recent CT of the chest was concerning for septic emboli. Blood cultures remain negative x2. TEE done on 11/01/2020 showed no evidence of vegetation, ID was consulted recommended to  repeat TEE to rule out right-sided endocarditis.  TEE scheduled for tomorrow 11/25/2020.  Bilateral lower extremity wounds: Wound care was consulted recommended dressing changes and applying Xeroform gauze to cover blisters.  Macrocytic anemia: Hemoglobin at baseline like around 9 we will continue to monitor intermittently. Her hemoglobin is now slowly going up 2 days ago was 7.8 this morning is 8.1.  Essential hypertension: Blood pressure relatively stable.  PVD: Continue aspirin and Plavix.  Congenital solitary kidney: Creatinine appears to be at baseline we will continue to monitor intermittently.  PMR: She recently follow-up with her rheumatologist Dr. [name] on 11/15/2020 who recommended to continue to hold prednisone.,  Has a follow-up appointment in 12/13/2020 as an outpatient with him.  Left upper extremity swelling and shoulder pain: X-ray shows no dislocation or effusion. Will put her in a sling. Upper extremity Doppler was negative for DVT.   DVT prophylaxis: lovenox Family Communication:none Status is: Inpatient  Remains inpatient appropriate because:Hemodynamically unstable   Dispo: The patient is from: Home              Anticipated d/c is to: SNF              Patient currently is not medically stable to d/c.   Difficult to place patient No        Code Status:     Code Status Orders  (From admission, onward)         Start     Ordered   11/21/20 1453  Do not attempt resuscitation (DNR)  Continuous       Question Answer Comment  In the event  of cardiac or respiratory ARREST Do not call a "code blue"   In the event of cardiac or respiratory ARREST Do not perform Intubation, CPR, defibrillation or ACLS   In the event of cardiac or respiratory ARREST Use medication by any route, position, wound care, and other measures to relive pain and suffering. May use oxygen, suction and manual treatment of airway obstruction as needed for comfort.      11/21/20 1455         Code Status History    Date Active Date Inactive Code Status Order ID Comments User Context   10/23/2020 1916 11/05/2020 2058 DNR [id number]  [name], DO ED   09/01/2020 1719 09/07/2020 2028 DNR [id number]  [name], [place], DO ED   09/06/2019 0930 09/09/2019 0025 DNR [id number]  [name], MD ED   04/03/2018 2133 04/09/2018 1656 Full Code [id number]  [name], [place], [place] ED   04/03/2018 1553 04/03/2018 2133 Full Code [id number]  [name], PA-C ED   09/03/2017 0902 09/03/2017 1950 Full Code [id number]  [name], MD ED   09/20/2016 1357 09/21/2016 1626 Full Code [id number]  [name], MD ED   01/31/2016 1130 02/01/2016 1625 Full Code [id number]  [name], MD Inpatient   10/19/2014 2043 10/27/2014 1717 Partial Code [id number]  [name], MD Inpatient   10/19/2014 1957 10/19/2014 2043 Full Code [id number]  [name], MD Inpatient   03/21/2014 1731 03/24/2014 2002 Full Code [id number]  [name], MD Inpatient   10/05/2013 0219 10/07/2013 1832 Full Code [id number]  [name], MD Inpatient   Advance Care Planning Activity    Advance Directive Documentation   Flowsheet Row Most Recent Value  Type of Advance Directive Healthcare Power of Attorney, Living will  Pre-existing out of facility DNR order (yellow form or pink MOST form) --  "MOST" Form in Place? --        IV Access:    Peripheral IV   Procedures and diagnostic studies:   MR LUMBAR SPINE WO CONTRAST  Result Date: 11/25/2020 CLINICAL DATA:  Back pain EXAM: MRI LUMBAR SPINE WITHOUT CONTRAST TECHNIQUE: Multiplanar, multisequence MR imaging of the lumbar spine was performed. No intravenous contrast was administered. COMPARISON:  Lumbar spine MRI 06/23/2017 FINDINGS: Segmentation:  Standard. Alignment:  There is grade 1 retrolisthesis at L1-2. Vertebrae:  No fracture, evidence of discitis, or bone lesion. Conus medullaris and cauda equina: Conus extends to the L1 level. Conus and cauda equina appear  normal. Paraspinal and other soft tissues: Left kidney is atrophic. Disc levels: T10-L1 levels are imaged only in the sagittal plane. There are small disc bulges at all levels without spinal canal stenosis. L1-L2: Disc bulge with endplate spurring. No spinal canal stenosis. Unchanged moderate bilateral neural foraminal stenosis. L2-L3: Disc desiccation with minimal bulge. Normal facets. No spinal canal stenosis. No neural foraminal stenosis. L3-L4: Disc bulge and endplate spurring. Left lateral recess narrowing without central spinal canal stenosis. Mild right and slightly progressed moderate left neural foraminal stenosis. L4-L5: Disc bulge with mild facet hypertrophy. No spinal canal stenosis. Unchanged moderate bilateral neural foraminal stenosis. L5-S1: Disc height loss without herniation. No spinal canal stenosis. No neural foraminal stenosis. Visualized sacrum: Normal. IMPRESSION: 1. Unchanged moderate bilateral neural foraminal stenosis at L1-L2 and L4-L5. 2. Slight worsening of moderate left L3-4 neural foraminal stenosis. 3. No central spinal canal stenosis. Electronically Signed   By: Ulyses Jarred M.D.   On: 11/25/2020 19:02   DG Shoulder Left  Result Date:  11/26/2020 CLINICAL DATA:  Pain and limited range of motion.  No recent trauma. EXAM: LEFT SHOULDER - 2+ VIEW COMPARISON:  None FINDINGS: Mild and expected for age degenerative changes of the undersurface of the acromion and humeral head. No acute fracture or dislocation. Cervical spine fixation. Visualized portion of the left hemithorax is normal. IMPRESSION: No acute osseous abnormality. Electronically Signed   By: Abigail Miyamoto M.D.   On: 11/26/2020 13:19   VAS [place] UPPER EXTREMITY VENOUS DUPLEX  Result Date: 11/26/2020 UPPER VENOUS STUDY  Patient Name:  BRYANNA YIM Jurich  Date of Exam:   11/26/2020 Medical Rec #: [id number]    Accession #:    [id number] Date of Birth: 12-04-1940    Patient Gender: F Patient Age:   34Y Exam Location:  [facility] Procedure:      VAS [place] UPPER EXTREMITY VENOUS DUPLEX Referring Phys: [id number] FELIZ ORTIZ --------------------------------------------------------------------------------  Indications: Pain Risk Factors: None identified. Limitations: Poor ultrasound/tissue interface. Comparison Study: No prior studies. Performing Technologist: Oliver Hum RVT  Examination Guidelines: A complete evaluation includes B-mode imaging, spectral Doppler, color Doppler, and power Doppler as needed of all accessible portions of each vessel. Bilateral testing is considered an integral part of a complete examination. Limited examinations for reoccurring indications may be performed as noted.  Right Findings: +----------+------------+---------+-----------+----------+-------+ RIGHT     CompressiblePhasicitySpontaneousPropertiesSummary +----------+------------+---------+-----------+----------+-------+ Subclavian    Full       Yes       Yes                      +----------+------------+---------+-----------+----------+-------+  Left Findings: +----------+------------+---------+-----------+----------+-------+ LEFT      CompressiblePhasicitySpontaneousPropertiesSummary +----------+------------+---------+-----------+----------+-------+ IJV           Full       Yes       Yes                      +----------+------------+---------+-----------+----------+-------+ Subclavian    Full       Yes       Yes                      +----------+------------+---------+-----------+----------+-------+ Axillary      Full       Yes       Yes                      +----------+------------+---------+-----------+----------+-------+ Brachial      Full       Yes       Yes                      +----------+------------+---------+-----------+----------+-------+ Radial        Full                                          +----------+------------+---------+-----------+----------+-------+ Ulnar         Full                                           +----------+------------+---------+-----------+----------+-------+ Cephalic      Full                                          +----------+------------+---------+-----------+----------+-------+  Basilic       Full                                          +----------+------------+---------+-----------+----------+-------+  Summary:  Right: No evidence of thrombosis in the subclavian.  Left: No evidence of deep vein thrombosis in the upper extremity. No evidence of superficial vein thrombosis in the upper extremity.  *See table(s) above for measurements and observations.  Diagnosing physician: Deitra Mayo MD Electronically signed by Deitra Mayo MD on 11/26/2020 at 8:18:20 PM.    Final      Medical Consultants:    None.   Subjective:    Nyanna L Sliney no complaints. Objective:    Vitals:   11/27/20 0144 11/27/20 0443 11/27/20 0500 11/27/20 0926  BP:  133/61  (!) 109/59  Pulse: 61 87  90  Resp:  17  18  Temp: 98 F (36.7 C) 100.1 F (37.8 C) 98.3 F (36.8 C) 98.1 F (36.7 C)  TempSrc: Oral Oral Oral Oral  SpO2:  96%  96%  Weight:      Height:       SpO2: 96 % O2 Flow Rate (L/min): 1.5 L/min   Intake/Output Summary (Last 24 hours) at 11/27/2020 0938 Last data filed at 11/27/2020 0053 Gross per 24 hour  Intake 633 ml  Output 1250 ml  Net -617 ml   Filed Weights   11/21/20 1031 11/25/20 2122  Weight: 63 kg 63.2 kg    Exam: General exam: In no acute distress. Respiratory system: Good air movement and clear to auscultation. Cardiovascular system: S1 & S2 heard, RRR. No JVD. Gastrointestinal system: Abdomen is nondistended, soft and nontender.  Extremities: No pedal edema. Skin: No rashes, lesions or ulcers Psychiatry: Judgement and insight appear normal. Mood & affect appropriate..   Data Reviewed:    Labs: Basic Metabolic Panel: Recent Labs  Lab 11/23/20 0317 11/24/20 0412 11/25/20 0259  11/26/20 0334 11/27/20 0436  NA 141 139 139 137 136  K 4.4 4.1 4.3 3.7 3.5  CL 103 102 99 99 99  CO2 34* 32 35* 34* 33*  GLUCOSE 83 80 92 85 92  BUN 9 8 7* 8 7*  CREATININE 0.83 0.83 0.83 0.75 0.77  CALCIUM 9.1 8.9 9.3 8.8* 8.5*   GFR Estimated Creatinine Clearance (by C-G formula based on SCr of 0.77 mg/dL) Female: 45.8 mL/min Female: 56.6 mL/min Liver Function Tests: Recent Labs  Lab 11/21/20 1050  AST 11*  ALT 6  ALKPHOS 66  BILITOT 0.4  PROT 5.9*  ALBUMIN 2.3*   No results for input(s): LIPASE, AMYLASE in the last 168 hours. No results for input(s): AMMONIA in the last 168 hours. Coagulation profile Recent Labs  Lab 11/21/20 1050  INR 1.1   COVID-19 Labs  No results for input(s): DDIMER, FERRITIN, LDH, CRP in the last 72 hours.  Lab Results  Component Value Date   SARSCOV2NAA NEGATIVE 11/21/2020   [place] NEGATIVE 11/04/2020   [place] NEGATIVE 10/27/2020   [place] NEGATIVE 09/06/2020    CBC: Recent Labs  Lab 11/23/20 0317 11/24/20 0412 11/25/20 0259 11/26/20 0334 11/27/20 0436  WBC 4.4 5.1 6.0 5.6 6.7  NEUTROABS 2.2 2.7 3.5 3.3 4.1  HGB 8.2* 8.1* 8.6* 8.2* 7.9*  HCT 26.3* 26.2* 27.5* 26.0* 24.1*  MCV 103.5* 104.0* 102.6* 102.4* 98.0  PLT 172 182 199 203 222   Cardiac Enzymes: No  results for input(s): CKTOTAL, CKMB, CKMBINDEX, TROPONINI in the last 168 hours. BNP (last 3 results) No results for input(s): PROBNP in the last 8760 hours. CBG: Recent Labs  Lab 11/23/20 0850 11/23/20 1127 11/23/20 1648 11/23/20 2038  GLUCAP 98 91 117* 114*   D-Dimer: No results for input(s): DDIMER in the last 72 hours. Hgb A1c: No results for input(s): HGBA1C in the last 72 hours. Lipid Profile: No results for input(s): CHOL, HDL, LDLCALC, TRIG, CHOLHDL, LDLDIRECT in the last 72 hours. Thyroid function studies: No results for input(s): TSH, T4TOTAL, T3FREE, THYROIDAB in the last 72 hours.  Invalid input(s): FREET3 Anemia work up: No  results for input(s): VITAMINB12, FOLATE, FERRITIN, TIBC, IRON, RETICCTPCT in the last 72 hours. Sepsis Labs: Recent Labs  Lab 11/21/20 1050 11/21/20 1318 11/21/20 1650 11/22/20 0257 11/24/20 0412 11/25/20 0259 11/26/20 0334 11/27/20 0436  PROCALCITON  --   --  <0.10  --   --   --   --   --   WBC 7.4  --   --    < > 5.1 6.0 5.6 6.7  LATICACIDVEN 1.0 0.9  --   --   --   --   --   --    < > = values in this interval not displayed.   Microbiology Recent Results (from the past 240 hour(s))  Resp Panel by RT-PCR (Flu A&B, Covid) Nasopharyngeal Swab     Status: None   Collection Time: 11/21/20 10:55 AM   Specimen: Nasopharyngeal Swab; Nasopharyngeal(NP) swabs in vial transport medium  Result Value Ref Range Status   SARS Coronavirus 2 by RT PCR NEGATIVE NEGATIVE Final    Comment: (NOTE) SARS-CoV-2 target nucleic acids are NOT DETECTED.  The SARS-CoV-2 RNA is generally detectable in upper respiratory specimens during the acute phase of infection. The lowest concentration of SARS-CoV-2 viral copies this assay can detect is 138 copies/mL. A negative result does not preclude SARS-Cov-2 infection and should not be used as the sole basis for treatment or other patient management decisions. A negative result may occur with  improper specimen collection/handling, submission of specimen other than nasopharyngeal swab, presence of viral mutation(s) within the areas targeted by this assay, and inadequate number of viral copies(<138 copies/mL). A negative result must be combined with clinical observations, patient history, and epidemiological information. The expected result is Negative.  Fact Sheet for Patients:  EntrepreneurPulse.com.au  Fact Sheet for Healthcare Providers:  IncredibleEmployment.be  This test is no t yet approved or cleared by the [place] FDA and  has been authorized for detection and/or diagnosis of SARS-CoV-2 by FDA under an  Emergency Use Authorization (EUA). This EUA will remain  in effect (meaning this test can be used) for the duration of the COVID-19 declaration under Section 564(b)(1) of the Act, 21 U.S.C.section 360bbb-3(b)(1), unless the authorization is terminated  or revoked sooner.       Influenza A by PCR NEGATIVE NEGATIVE Final   Influenza B by PCR NEGATIVE NEGATIVE Final    Comment: (NOTE) The Xpert Xpress SARS-CoV-2/FLU/RSV plus assay is intended as an aid in the diagnosis of influenza from Nasopharyngeal swab specimens and should not be used as a sole basis for treatment. Nasal washings and aspirates are unacceptable for Xpert Xpress SARS-CoV-2/FLU/RSV testing.  Fact Sheet for Patients: EntrepreneurPulse.com.au  Fact Sheet for Healthcare Providers: IncredibleEmployment.be  This test is not yet approved or cleared by the [place] FDA and has been authorized for detection and/or diagnosis of SARS-CoV-2 by FDA  under an Emergency Use Authorization (EUA). This EUA will remain in effect (meaning this test can be used) for the duration of the COVID-19 declaration under Section 564(b)(1) of the Act, 21 U.S.C. section 360bbb-3(b)(1), unless the authorization is terminated or revoked.  Performed at [facility], [place] [address], [place], [place] [address]   Blood Culture (routine x 2)     Status: None   Collection Time: 11/21/20 10:55 AM   Specimen: BLOOD RIGHT ARM  Result Value Ref Range Status   Specimen Description BLOOD RIGHT ARM  Final   Special Requests   Final    BOTTLES DRAWN AEROBIC AND ANAEROBIC Blood Culture adequate volume   Culture   Final    NO GROWTH 5 DAYS Performed at [facility], [place] [address], [place], [place] [address]    Report Status 11/26/2020 FINAL  Final  Urine culture     Status: Abnormal   Collection Time: 11/21/20 10:55 AM   Specimen: In/Out Cath Urine  Result Value Ref Range Status   Specimen  Description IN/OUT CATH URINE  Final   Special Requests   Final    NONE Performed at [facility], [place] [address] [place], [place] [address]    Culture (A)  Final    40,000 COLONIES/mL KLEBSIELLA PNEUMONIAE 40,000 COLONIES/mL ENTEROBACTER CLOACAE    Report Status 11/23/2020 FINAL  Final   Organism ID, Bacteria KLEBSIELLA PNEUMONIAE (A)  Final   Organism ID, Bacteria ENTEROBACTER CLOACAE (A)  Final      Susceptibility   Enterobacter cloacae - MIC*    CEFAZOLIN >=64 RESISTANT Resistant     CEFEPIME <=0.12 SENSITIVE Sensitive     CIPROFLOXACIN <=0.25 SENSITIVE Sensitive     GENTAMICIN <=1 SENSITIVE Sensitive     IMIPENEM <=0.25 SENSITIVE Sensitive     NITROFURANTOIN 64 INTERMEDIATE Intermediate     TRIMETH/SULFA <=20 SENSITIVE Sensitive     PIP/TAZO <=4 SENSITIVE Sensitive     * 40,000 COLONIES/mL ENTEROBACTER CLOACAE   Klebsiella pneumoniae - MIC*    AMPICILLIN >=32 RESISTANT Resistant     CEFAZOLIN <=4 SENSITIVE Sensitive     CEFEPIME <=0.12 SENSITIVE Sensitive     CEFTRIAXONE <=0.25 SENSITIVE Sensitive     CIPROFLOXACIN <=0.25 SENSITIVE Sensitive     GENTAMICIN <=1 SENSITIVE Sensitive     IMIPENEM <=0.25 SENSITIVE Sensitive     NITROFURANTOIN 128 RESISTANT Resistant     TRIMETH/SULFA <=20 SENSITIVE Sensitive     AMPICILLIN/SULBACTAM 8 SENSITIVE Sensitive     PIP/TAZO <=4 SENSITIVE Sensitive     * 40,000 COLONIES/mL KLEBSIELLA PNEUMONIAE  Blood Culture (routine x 2)     Status: None   Collection Time: 11/21/20 11:00 AM   Specimen: BLOOD LEFT FOREARM  Result Value Ref Range Status   Specimen Description BLOOD LEFT FOREARM  Final   Special Requests   Final    BOTTLES DRAWN AEROBIC AND ANAEROBIC Blood Culture adequate volume   Culture   Final    NO GROWTH 5 DAYS Performed at [facility], [address], [place], [place] [address]    Report Status 11/26/2020 FINAL  Final  MRSA PCR Screening     Status: None   Collection Time: 11/22/20 12:37 AM   Specimen:  Nasal Mucosa; Nasopharyngeal  Result Value Ref Range Status   MRSA by PCR NEGATIVE NEGATIVE Final    Comment:        The GeneXpert MRSA Assay (FDA approved for NASAL specimens only), is  one component of a comprehensive MRSA colonization surveillance program. It is not intended to diagnose MRSA infection nor to guide or monitor treatment for MRSA infections. Performed at [facility], [place] [address], [place], [place] [address]      Medications:   . aspirin  81 mg Oral Daily  . busPIRone  15 mg Oral TID  . Chlorhexidine Gluconate Cloth  6 each Topical Daily  . clopidogrel  75 mg Oral q morning  . dextromethorphan-guaiFENesin  1 tablet Oral BID  . divalproex  250 mg Oral BID  . timolol  1 drop Both Eyes BID   And  . dorzolamide  1 drop Both Eyes BID  . enoxaparin (LOVENOX) injection  40 mg Subcutaneous Q24H  . gabapentin  200 mg Oral TID  . latanoprost  1 drop Both Eyes QHS  . pantoprazole  40 mg Oral Daily  . sodium chloride flush  10-40 mL Intracatheter Q12H  . sodium chloride flush  3 mL Intravenous Q12H   Continuous Infusions: . vancomycin 500 mg (11/27/20 0422)      LOS: 6 days   Charlynne Cousins  [facility]  11/27/2020, 9:38 AM

## 2020-11-28 DIAGNOSIS — Z66 Do not resuscitate: Secondary | ICD-10-CM | POA: Diagnosis not present

## 2020-11-28 DIAGNOSIS — J189 Pneumonia, unspecified organism: Secondary | ICD-10-CM | POA: Diagnosis not present

## 2020-11-28 DIAGNOSIS — A419 Sepsis, unspecified organism: Secondary | ICD-10-CM | POA: Diagnosis not present

## 2020-11-28 DIAGNOSIS — I10 Essential (primary) hypertension: Secondary | ICD-10-CM | POA: Diagnosis not present

## 2020-11-28 LAB — CBC WITH DIFFERENTIAL/PLATELET
Abs Immature Granulocytes: 0.03 10*3/uL (ref 0.00–0.07)
Basophils Absolute: 0 10*3/uL (ref 0.0–0.1)
Basophils Relative: 0 %
Eosinophils Absolute: 0.2 10*3/uL (ref 0.0–0.5)
Eosinophils Relative: 4 %
HCT: 27 % — ABNORMAL LOW (ref 36.0–46.0)
Hemoglobin: 8.6 g/dL — ABNORMAL LOW (ref 12.0–15.0)
Immature Granulocytes: 1 %
Lymphocytes Relative: 23 %
Lymphs Abs: 1.3 10*3/uL (ref 0.7–4.0)
MCH: 32.2 pg (ref 26.0–34.0)
MCHC: 31.9 g/dL (ref 30.0–36.0)
MCV: 101.1 fL — ABNORMAL HIGH (ref 80.0–100.0)
Monocytes Absolute: 0.7 10*3/uL (ref 0.1–1.0)
Monocytes Relative: 12 %
Neutro Abs: 3.2 10*3/uL (ref 1.7–7.7)
Neutrophils Relative %: 60 %
Platelets: 265 10*3/uL (ref 150–400)
RBC: 2.67 MIL/uL — ABNORMAL LOW (ref 3.87–5.11)
RDW: 15.2 % (ref 11.5–15.5)
WBC: 5.4 10*3/uL (ref 4.0–10.5)
nRBC: 0 % (ref 0.0–0.2)
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
## 2020-11-28 NOTE — Progress Notes (Signed)
Pharmacy Antibiotic Note  [name] [name] is a 80 y.o. adult admitted on 11/21/2020 with ultifocal pneumonia thought related to recent MRSA bacteremia (presumed right sided endocarditis ).  Pharmacy has been consulted for Vancomcyin dosing.  SF:KCLEXNTZGY pneumonia thought related to recent MRSA bacteremia (presumed right sided endocarditis ).  - wbc wnl, Afebrile. MRSA neg. - TEE 5/19>>neg veg.  D29/42  PCT<0.1  5/15 vanc>> (6/3) 5/15 cefepime>>5/17 5/15 flagyl>>5/17  5/18 VP was 18 mg/L (~3.5 hrs after 500 mg dose given); VR 9 (11h after peak) >> AUC 258  5/15 urine (cath)>>40k kleb and enterobacter MRSA neg 5/15 blood>>neg  Plan: -Change Vancomycin 500 Q12 Goal AUC 400-550, Expected AUC: 515 Repeat level Monday if still here OPAT done 5/20. Awaiting SNF placement.     Height: 5' (152.4 cm) Weight: 63.2 kg (139 lb 3.7 oz) IBW/kg (Calculated) : 45.5  Temp (24hrs), Avg:98.4 F (36.9 C), Min:98.1 F (36.7 C), Max:98.8 F (37.1 C)  Recent Labs  Lab 11/21/20 1050 11/21/20 1318 11/22/20 0257 11/23/20 0317 11/24/20 0412 11/24/20 1615 11/25/20 0259 11/26/20 0334 11/27/20 0436 11/28/20 0316  WBC 7.4  --    < > 4.4 5.1  --  6.0 5.6 6.7 5.4  CREATININE 0.88  --    < > 0.83 0.83  --  0.83 0.75 0.77  --   LATICACIDVEN 1.0 0.9  --   --   --   --   --   --   --   --   VANCOPEAK  --   --   --   --   --  18*  --   --   --   --   VANCORANDOM  --   --   --   --   --   --  9  --   --   --    < > = values in this interval not displayed.    Estimated Creatinine Clearance (by C-G formula based on SCr of 0.77 mg/dL) Female: 45.8 mL/min Female: 56.6 mL/min    Allergies  Allergen Reactions  . Statins   . Gabapentin Itching    Was deleted since patient was taking, but now experiencing itching again.   . Statins Swelling, Rash and Other (See Comments)    Swelling involving tongue; also causes muscle pain    Virgilio Broadhead S. Alford [place], PharmD, BCPS Clinical Staff  Pharmacist [email or web address]  Wayland Salinas 11/28/2020 10:05 AM

## 2020-11-28 NOTE — Plan of Care (Signed)
  Problem: Nutrition: Goal: Adequate nutrition will be maintained Outcome: Adequate for Discharge   Problem: Activity: Goal: Risk for activity intolerance will decrease Outcome: Adequate for Discharge   Problem: Clinical Measurements: Goal: Respiratory complications will improve Outcome: Adequate for Discharge   Problem: Respiratory: Goal: Ability to maintain adequate ventilation will improve Outcome: Adequate for Discharge

## 2020-11-28 NOTE — Progress Notes (Signed)
Name: [name] [name] DOB: 21-Oct-1940  Please be advised that the above-named patient will require a short-term nursing home stay -- anticipated 30 days or less for rehabilitation and strengthening. The plan is for return home.

## 2020-11-28 NOTE — Progress Notes (Signed)
[facility] PROGRESS NOTE    Progress Note  SYEIRA STAVE  [id number] DOB: 10/01/1940 DOA: 11/21/2020 PCP: [name], MD     Brief Narrative:   MUNIBA CISNEY is an 80 y.o. adult past medical history significant for essential hypertension PVD PMR and congenital solitary kidney, also recently discharged from the hospital on 11/04/2020 for MRSA bacteremia during that hospitalization she underwent a TEE that showed no signs of vegetation ID recommended daptomycin for 4 weeks through her PICC line.  She relates she was woken up from sleep with a cough left-sided chest pain and shortness of breath, she was found by staff to be satting 86% on room air and a temperature of 101.3, placed on 3 L of oxygen saturations improved.  Chest x-ray was concerning for possible left upper lobe infiltrates.  ID was consulted TEE was done that showed no vegetation, ID recommended to continue for 42 days of IV antibiotic vancomycin.  Patient is medically stable to be transferred to skilled nursing facility awaiting placement.   Assessment/Plan:   Sepsis due to pneumonia [facility]): TEE showed no vegetation.  Possibly related to MRSA bacteremia. ID recommended to continue IV Vanco for 42 days stop date is 12/11/2019. Physical therapy evaluated the patient and recommended skilled nursing facility.  Acute respiratory failure with hypoxia secondary to above: Requiring 2 L of oxygen slowly weaning off her oxygen likely secondary to #1.  History of MRSA bacteremia: Currently on vancomycin we will continue this for her duration of her therapy. Further management of her antibiotics per ID. ID recommended IV vancomycin for 42 days.[name]  History of sepsis pulmonary septic emboli: Recent CT of the chest was concerning for septic emboli. Blood cultures remain negative x2. TEE scheduled for tomorrow 11/25/2020.  Bilateral lower extremity wounds: Wound care was consulted recommended dressing changes and applying  Xeroform gauze to cover blisters.  Macrocytic anemia: Hemoglobin at baseline like around 9 we will continue to monitor intermittently. Her hemoglobin is now slowly going up 2 days ago was 7.8 this morning is 8.1.  Essential hypertension: Blood pressure relatively stable.  PVD: Continue aspirin and Plavix.  Congenital solitary kidney: Creatinine appears to be at baseline we will continue to monitor intermittently.  PMR: She recently follow-up with her rheumatologist Dr. [name] on 11/15/2020 who recommended to continue to hold prednisone.,  Has a follow-up appointment in 12/13/2020 as an outpatient with him.  Left upper extremity swelling and shoulder pain: X-ray shows no dislocation or effusion. Will put her in a sling. Upper extremity Doppler was negative for DVT.   DVT prophylaxis: lovenox Family Communication:none Status is: Inpatient  Remains inpatient appropriate because:Hemodynamically unstable   Dispo: The patient is from: Home              Anticipated d/c is to: SNF              Patient currently is not medically stable to d/c.   Difficult to place patient No        Code Status:     Code Status Orders  (From admission, onward)         Start     Ordered   11/21/20 1453  Do not attempt resuscitation (DNR)  Continuous       Question Answer Comment  In the event of cardiac or respiratory ARREST Do not call a "code blue"   In the event of cardiac or respiratory ARREST Do not perform Intubation, CPR, defibrillation or ACLS  In the event of cardiac or respiratory ARREST Use medication by any route, position, wound care, and other measures to relive pain and suffering. May use oxygen, suction and manual treatment of airway obstruction as needed for comfort.      11/21/20 1455        Code Status History    Date Active Date Inactive Code Status Order ID Comments User Context   10/23/2020 1916 11/05/2020 2058 DNR [id number]  [name], DO ED   09/01/2020 1719  09/07/2020 2028 DNR [id number]  [name], Duncan, DO ED   09/06/2019 0930 09/09/2019 0025 DNR [id number]  [name], MD ED   04/03/2018 2133 04/09/2018 1656 Full Code [id number]  [name], [place], [place] ED   04/03/2018 1553 04/03/2018 2133 Full Code [id number]  [name], PA-C ED   09/03/2017 0902 09/03/2017 1950 Full Code [id number]  [name], MD ED   09/20/2016 1357 09/21/2016 1626 Full Code [id number]  [name], MD ED   01/31/2016 1130 02/01/2016 1625 Full Code [id number]  [name], MD Inpatient   10/19/2014 2043 10/27/2014 1717 Partial Code [id number]  [name], MD Inpatient   10/19/2014 1957 10/19/2014 2043 Full Code [id number]  [name], MD Inpatient   03/21/2014 1731 03/24/2014 2002 Full Code [id number]  [name], MD Inpatient   10/05/2013 0219 10/07/2013 1832 Full Code [id number]  [name], MD Inpatient   Advance Care Planning Activity    Advance Directive Documentation   Flowsheet Row Most Recent Value  Type of Advance Directive Healthcare Power of Attorney, Living will  Pre-existing out of facility DNR order (yellow form or pink MOST form) --  "MOST" Form in Place? --        IV Access:    Peripheral IV   Procedures and diagnostic studies:   DG Shoulder Left  Result Date: 11/26/2020 CLINICAL DATA:  Pain and limited range of motion.  No recent trauma. EXAM: LEFT SHOULDER - 2+ VIEW COMPARISON:  None FINDINGS: Mild and expected for age degenerative changes of the undersurface of the acromion and humeral head. No acute fracture or dislocation. Cervical spine fixation. Visualized portion of the left hemithorax is normal. IMPRESSION: No acute osseous abnormality. Electronically Signed   By: Abigail Miyamoto M.D.   On: 11/26/2020 13:19   VAS [place] UPPER EXTREMITY VENOUS DUPLEX  Result Date: 11/26/2020 UPPER VENOUS STUDY  Patient Name:  DORINA RIBAUDO Medico  Date of Exam:   11/26/2020 Medical Rec #: [id number]    Accession #:    [id number] Date of Birth: 1941/05/06    Patient  Gender: F Patient Age:   72Y Exam Location:  [facility] Procedure:      VAS [place] UPPER EXTREMITY VENOUS DUPLEX Referring Phys: [id number] Hilberto Burzynski FELIZ ORTIZ --------------------------------------------------------------------------------  Indications: Pain Risk Factors: None identified. Limitations: Poor ultrasound/tissue interface. Comparison Study: No prior studies. Performing Technologist: Oliver Hum RVT  Examination Guidelines: A complete evaluation includes B-mode imaging, spectral Doppler, color Doppler, and power Doppler as needed of all accessible portions of each vessel. Bilateral testing is considered an integral part of a complete examination. Limited examinations for reoccurring indications may be performed as noted.  Right Findings: +----------+------------+---------+-----------+----------+-------+ RIGHT     CompressiblePhasicitySpontaneousPropertiesSummary +----------+------------+---------+-----------+----------+-------+ Subclavian    Full       Yes       Yes                      +----------+------------+---------+-----------+----------+-------+  Left Findings: +----------+------------+---------+-----------+----------+-------+  LEFT      CompressiblePhasicitySpontaneousPropertiesSummary +----------+------------+---------+-----------+----------+-------+ IJV           Full       Yes       Yes                      +----------+------------+---------+-----------+----------+-------+ Subclavian    Full       Yes       Yes                      +----------+------------+---------+-----------+----------+-------+ Axillary      Full       Yes       Yes                      +----------+------------+---------+-----------+----------+-------+ Brachial      Full       Yes       Yes                      +----------+------------+---------+-----------+----------+-------+ Radial        Full                                           +----------+------------+---------+-----------+----------+-------+ Ulnar         Full                                          +----------+------------+---------+-----------+----------+-------+ Cephalic      Full                                          +----------+------------+---------+-----------+----------+-------+ Basilic       Full                                          +----------+------------+---------+-----------+----------+-------+  Summary:  Right: No evidence of thrombosis in the subclavian.  Left: No evidence of deep vein thrombosis in the upper extremity. No evidence of superficial vein thrombosis in the upper extremity.  *See table(s) above for measurements and observations.  Diagnosing physician: Deitra Mayo MD Electronically signed by Deitra Mayo MD on 11/26/2020 at 8:18:20 PM.    Final      Medical Consultants:    None.   Subjective:    Ayme L Salvucci no complaints. Objective:    Vitals:   11/27/20 1616 11/27/20 2118 11/28/20 0459 11/28/20 0852  BP: 128/75 139/66 (!) 150/79 (!) 163/77  Pulse: 85 88 83 90  Resp: 18 18 18 18   Temp: 98.8 F (37.1 C) 98.6 F (37 C) 98.1 F (36.7 C) 98.1 F (36.7 C)  TempSrc: Oral Oral Oral Oral  SpO2: 99% 97% 94% 99%  Weight:      Height:       SpO2: 99 % O2 Flow Rate (L/min): 15 L/min   Intake/Output Summary (Last 24 hours) at 11/28/2020 0949 Last data filed at 11/28/2020 0600 Gross per 24 hour  Intake 542.14 ml  Output 2075 ml  Net -1532.86 ml   Filed Weights   11/21/20 1031 11/25/20 2122  Weight: 63 kg 63.2 kg    Exam: General exam: In no acute distress. Respiratory system: Good air movement and clear to auscultation. Cardiovascular system: S1 & S2 heard, RRR. No JVD. Gastrointestinal system: Abdomen is nondistended, soft and nontender.  Skin: No rashes, lesions or ulcers Psychiatry: Judgement and insight appear normal. Mood & affect appropriate.   Data Reviewed:     Labs: Basic Metabolic Panel: Recent Labs  Lab 11/23/20 0317 11/24/20 0412 11/25/20 0259 11/26/20 0334 11/27/20 0436  NA 141 139 139 137 136  K 4.4 4.1 4.3 3.7 3.5  CL 103 102 99 99 99  CO2 34* 32 35* 34* 33*  GLUCOSE 83 80 92 85 92  BUN 9 8 7* 8 7*  CREATININE 0.83 0.83 0.83 0.75 0.77  CALCIUM 9.1 8.9 9.3 8.8* 8.5*   GFR Estimated Creatinine Clearance (by C-G formula based on SCr of 0.77 mg/dL) Female: 45.8 mL/min Female: 56.6 mL/min Liver Function Tests: Recent Labs  Lab 11/21/20 1050  AST 11*  ALT 6  ALKPHOS 66  BILITOT 0.4  PROT 5.9*  ALBUMIN 2.3*   No results for input(s): LIPASE, AMYLASE in the last 168 hours. No results for input(s): AMMONIA in the last 168 hours. Coagulation profile Recent Labs  Lab 11/21/20 1050  INR 1.1   COVID-19 Labs  No results for input(s): DDIMER, FERRITIN, LDH, CRP in the last 72 hours.  Lab Results  Component Value Date   SARSCOV2NAA NEGATIVE 11/21/2020   [place] NEGATIVE 11/04/2020   [place] NEGATIVE 10/27/2020   [place] NEGATIVE 09/06/2020    CBC: Recent Labs  Lab 11/24/20 0412 11/25/20 0259 11/26/20 0334 11/27/20 0436 11/28/20 0316  WBC 5.1 6.0 5.6 6.7 5.4  NEUTROABS 2.7 3.5 3.3 4.1 3.2  HGB 8.1* 8.6* 8.2* 7.9* 8.6*  HCT 26.2* 27.5* 26.0* 24.1* 27.0*  MCV 104.0* 102.6* 102.4* 98.0 101.1*  PLT 182 199 203 222 265   Cardiac Enzymes: No results for input(s): CKTOTAL, CKMB, CKMBINDEX, TROPONINI in the last 168 hours. BNP (last 3 results) No results for input(s): PROBNP in the last 8760 hours. CBG: Recent Labs  Lab 11/23/20 0850 11/23/20 1127 11/23/20 1648 11/23/20 2038 11/27/20 1140  GLUCAP 98 91 117* 114* 93   D-Dimer: No results for input(s): DDIMER in the last 72 hours. Hgb A1c: No results for input(s): HGBA1C in the last 72 hours. Lipid Profile: No results for input(s): CHOL, HDL, LDLCALC, TRIG, CHOLHDL, LDLDIRECT in the last 72 hours. Thyroid function studies: No results for  input(s): TSH, T4TOTAL, T3FREE, THYROIDAB in the last 72 hours.  Invalid input(s): FREET3 Anemia work up: No results for input(s): VITAMINB12, FOLATE, FERRITIN, TIBC, IRON, RETICCTPCT in the last 72 hours. Sepsis Labs: Recent Labs  Lab 11/21/20 1050 11/21/20 1318 11/21/20 1650 11/22/20 0257 11/25/20 0259 11/26/20 0334 11/27/20 0436 11/28/20 0316  PROCALCITON  --   --  <0.10  --   --   --   --   --   WBC 7.4  --   --    < > 6.0 5.6 6.7 5.4  LATICACIDVEN 1.0 0.9  --   --   --   --   --   --    < > = values in this interval not displayed.   Microbiology Recent Results (from the past 240 hour(s))  Resp Panel by RT-PCR (Flu A&B, Covid) Nasopharyngeal Swab     Status: None   Collection Time: 11/21/20 10:55 AM   Specimen: Nasopharyngeal Swab; Nasopharyngeal(NP) swabs in vial transport  medium  Result Value Ref Range Status   SARS Coronavirus 2 by RT PCR NEGATIVE NEGATIVE Final    Comment: (NOTE) SARS-CoV-2 target nucleic acids are NOT DETECTED.  The SARS-CoV-2 RNA is generally detectable in upper respiratory specimens during the acute phase of infection. The lowest concentration of SARS-CoV-2 viral copies this assay can detect is 138 copies/mL. A negative result does not preclude SARS-Cov-2 infection and should not be used as the sole basis for treatment or other patient management decisions. A negative result may occur with  improper specimen collection/handling, submission of specimen other than nasopharyngeal swab, presence of viral mutation(s) within the areas targeted by this assay, and inadequate number of viral copies(<138 copies/mL). A negative result must be combined with clinical observations, patient history, and epidemiological information. The expected result is Negative.  Fact Sheet for Patients:  EntrepreneurPulse.com.au  Fact Sheet for Healthcare Providers:  IncredibleEmployment.be  This test is no t yet approved or cleared  by the [place] FDA and  has been authorized for detection and/or diagnosis of SARS-CoV-2 by FDA under an Emergency Use Authorization (EUA). This EUA will remain  in effect (meaning this test can be used) for the duration of the COVID-19 declaration under Section 564(b)(1) of the Act, 21 U.S.C.section 360bbb-3(b)(1), unless the authorization is terminated  or revoked sooner.       Influenza A by PCR NEGATIVE NEGATIVE Final   Influenza B by PCR NEGATIVE NEGATIVE Final    Comment: (NOTE) The Xpert Xpress SARS-CoV-2/FLU/RSV plus assay is intended as an aid in the diagnosis of influenza from Nasopharyngeal swab specimens and should not be used as a sole basis for treatment. Nasal washings and aspirates are unacceptable for Xpert Xpress SARS-CoV-2/FLU/RSV testing.  Fact Sheet for Patients: EntrepreneurPulse.com.au  Fact Sheet for Healthcare Providers: IncredibleEmployment.be  This test is not yet approved or cleared by the [place] FDA and has been authorized for detection and/or diagnosis of SARS-CoV-2 by FDA under an Emergency Use Authorization (EUA). This EUA will remain in effect (meaning this test can be used) for the duration of the COVID-19 declaration under Section 564(b)(1) of the Act, 21 U.S.C. section 360bbb-3(b)(1), unless the authorization is terminated or revoked.  Performed at [facility], [place] [address], [place], [place] [address]   Blood Culture (routine x 2)     Status: None   Collection Time: 11/21/20 10:55 AM   Specimen: BLOOD RIGHT ARM  Result Value Ref Range Status   Specimen Description BLOOD RIGHT ARM  Final   Special Requests   Final    BOTTLES DRAWN AEROBIC AND ANAEROBIC Blood Culture adequate volume   Culture   Final    NO GROWTH 5 DAYS Performed at [facility], [place] [address], [place], [place] [address]    Report Status 11/26/2020 FINAL  Final  Urine culture     Status: Abnormal    Collection Time: 11/21/20 10:55 AM   Specimen: In/Out Cath Urine  Result Value Ref Range Status   Specimen Description IN/OUT CATH URINE  Final   Special Requests   Final    NONE Performed at [facility], [place] [address] [place], [place] [address]    Culture (A)  Final    40,000 COLONIES/mL KLEBSIELLA PNEUMONIAE 40,000 COLONIES/mL ENTEROBACTER CLOACAE    Report Status 11/23/2020 FINAL  Final   Organism ID, Bacteria KLEBSIELLA PNEUMONIAE (A)  Final   Organism ID, Bacteria ENTEROBACTER CLOACAE (A)  Final  Susceptibility   Enterobacter cloacae - MIC*    CEFAZOLIN >=64 RESISTANT Resistant     CEFEPIME <=0.12 SENSITIVE Sensitive     CIPROFLOXACIN <=0.25 SENSITIVE Sensitive     GENTAMICIN <=1 SENSITIVE Sensitive     IMIPENEM <=0.25 SENSITIVE Sensitive     NITROFURANTOIN 64 INTERMEDIATE Intermediate     TRIMETH/SULFA <=20 SENSITIVE Sensitive     PIP/TAZO <=4 SENSITIVE Sensitive     * 40,000 COLONIES/mL ENTEROBACTER CLOACAE   Klebsiella pneumoniae - MIC*    AMPICILLIN >=32 RESISTANT Resistant     CEFAZOLIN <=4 SENSITIVE Sensitive     CEFEPIME <=0.12 SENSITIVE Sensitive     CEFTRIAXONE <=0.25 SENSITIVE Sensitive     CIPROFLOXACIN <=0.25 SENSITIVE Sensitive     GENTAMICIN <=1 SENSITIVE Sensitive     IMIPENEM <=0.25 SENSITIVE Sensitive     NITROFURANTOIN 128 RESISTANT Resistant     TRIMETH/SULFA <=20 SENSITIVE Sensitive     AMPICILLIN/SULBACTAM 8 SENSITIVE Sensitive     PIP/TAZO <=4 SENSITIVE Sensitive     * 40,000 COLONIES/mL KLEBSIELLA PNEUMONIAE  Blood Culture (routine x 2)     Status: None   Collection Time: 11/21/20 11:00 AM   Specimen: BLOOD LEFT FOREARM  Result Value Ref Range Status   Specimen Description BLOOD LEFT FOREARM  Final   Special Requests   Final    BOTTLES DRAWN AEROBIC AND ANAEROBIC Blood Culture adequate volume   Culture   Final    NO GROWTH 5 DAYS Performed at [facility], [address], [place], [place] [address]    Report Status  11/26/2020 FINAL  Final  MRSA PCR Screening     Status: None   Collection Time: 11/22/20 12:37 AM   Specimen: Nasal Mucosa; Nasopharyngeal  Result Value Ref Range Status   MRSA by PCR NEGATIVE NEGATIVE Final    Comment:        The GeneXpert MRSA Assay (FDA approved for NASAL specimens only), is one component of a comprehensive MRSA colonization surveillance program. It is not intended to diagnose MRSA infection nor to guide or monitor treatment for MRSA infections. Performed at [facility], [place] [address] [place], [place] [address]      Medications:   . aspirin  81 mg Oral Daily  . busPIRone  15 mg Oral TID  . Chlorhexidine Gluconate Cloth  6 each Topical Daily  . clopidogrel  75 mg Oral q morning  . dextromethorphan-guaiFENesin  1 tablet Oral BID  . divalproex  250 mg Oral BID  . timolol  1 drop Both Eyes BID   And  . dorzolamide  1 drop Both Eyes BID  . enoxaparin (LOVENOX) injection  40 mg Subcutaneous Q24H  . gabapentin  200 mg Oral TID  . latanoprost  1 drop Both Eyes QHS  . pantoprazole  40 mg Oral Daily  . sodium chloride flush  10-40 mL Intracatheter Q12H  . sodium chloride flush  3 mL Intravenous Q12H   Continuous Infusions: . vancomycin 500 mg (11/28/20 0506)      LOS: 7 days   Charlynne Cousins  [facility]  11/28/2020, 9:49 AM

## 2020-11-29 DIAGNOSIS — R262 Difficulty in walking, not elsewhere classified: Secondary | ICD-10-CM | POA: Diagnosis not present

## 2020-11-29 DIAGNOSIS — R0989 Other specified symptoms and signs involving the circulatory and respiratory systems: Secondary | ICD-10-CM | POA: Diagnosis not present

## 2020-11-29 DIAGNOSIS — I10 Essential (primary) hypertension: Secondary | ICD-10-CM | POA: Diagnosis not present

## 2020-11-29 DIAGNOSIS — I872 Venous insufficiency (chronic) (peripheral): Secondary | ICD-10-CM | POA: Diagnosis not present

## 2020-11-29 DIAGNOSIS — D539 Nutritional anemia, unspecified: Secondary | ICD-10-CM | POA: Diagnosis not present

## 2020-11-29 DIAGNOSIS — E669 Obesity, unspecified: Secondary | ICD-10-CM | POA: Diagnosis not present

## 2020-11-29 DIAGNOSIS — A419 Sepsis, unspecified organism: Secondary | ICD-10-CM | POA: Diagnosis not present

## 2020-11-29 DIAGNOSIS — K219 Gastro-esophageal reflux disease without esophagitis: Secondary | ICD-10-CM | POA: Diagnosis not present

## 2020-11-29 DIAGNOSIS — Z8616 Personal history of COVID-19: Secondary | ICD-10-CM | POA: Diagnosis not present

## 2020-11-29 DIAGNOSIS — R627 Adult failure to thrive: Secondary | ICD-10-CM | POA: Diagnosis not present

## 2020-11-29 DIAGNOSIS — R41841 Cognitive communication deficit: Secondary | ICD-10-CM | POA: Diagnosis not present

## 2020-11-29 DIAGNOSIS — Z5181 Encounter for therapeutic drug level monitoring: Secondary | ICD-10-CM | POA: Diagnosis not present

## 2020-11-29 DIAGNOSIS — R0902 Hypoxemia: Secondary | ICD-10-CM | POA: Diagnosis not present

## 2020-11-29 DIAGNOSIS — R051 Acute cough: Secondary | ICD-10-CM | POA: Diagnosis not present

## 2020-11-29 DIAGNOSIS — U071 COVID-19: Secondary | ICD-10-CM | POA: Diagnosis not present

## 2020-11-29 DIAGNOSIS — F332 Major depressive disorder, recurrent severe without psychotic features: Secondary | ICD-10-CM | POA: Diagnosis not present

## 2020-11-29 DIAGNOSIS — E871 Hypo-osmolality and hyponatremia: Secondary | ICD-10-CM | POA: Diagnosis not present

## 2020-11-29 DIAGNOSIS — R197 Diarrhea, unspecified: Secondary | ICD-10-CM | POA: Diagnosis not present

## 2020-11-29 DIAGNOSIS — E782 Mixed hyperlipidemia: Secondary | ICD-10-CM | POA: Diagnosis not present

## 2020-11-29 DIAGNOSIS — B9562 Methicillin resistant Staphylococcus aureus infection as the cause of diseases classified elsewhere: Secondary | ICD-10-CM | POA: Diagnosis not present

## 2020-11-29 DIAGNOSIS — M25512 Pain in left shoulder: Secondary | ICD-10-CM | POA: Diagnosis not present

## 2020-11-29 DIAGNOSIS — R5381 Other malaise: Secondary | ICD-10-CM | POA: Diagnosis not present

## 2020-11-29 DIAGNOSIS — M353 Polymyalgia rheumatica: Secondary | ICD-10-CM | POA: Diagnosis not present

## 2020-11-29 DIAGNOSIS — Q600 Renal agenesis, unilateral: Secondary | ICD-10-CM | POA: Diagnosis not present

## 2020-11-29 DIAGNOSIS — Z9981 Dependence on supplemental oxygen: Secondary | ICD-10-CM | POA: Diagnosis not present

## 2020-11-29 DIAGNOSIS — S41102A Unspecified open wound of left upper arm, initial encounter: Secondary | ICD-10-CM | POA: Diagnosis not present

## 2020-11-29 DIAGNOSIS — J9601 Acute respiratory failure with hypoxia: Secondary | ICD-10-CM | POA: Diagnosis not present

## 2020-11-29 DIAGNOSIS — M25569 Pain in unspecified knee: Secondary | ICD-10-CM | POA: Diagnosis not present

## 2020-11-29 DIAGNOSIS — I87311 Chronic venous hypertension (idiopathic) with ulcer of right lower extremity: Secondary | ICD-10-CM | POA: Diagnosis not present

## 2020-11-29 DIAGNOSIS — R338 Other retention of urine: Secondary | ICD-10-CM | POA: Diagnosis not present

## 2020-11-29 DIAGNOSIS — N1832 Chronic kidney disease, stage 3b: Secondary | ICD-10-CM | POA: Diagnosis not present

## 2020-11-29 DIAGNOSIS — A4902 Methicillin resistant Staphylococcus aureus infection, unspecified site: Secondary | ICD-10-CM | POA: Diagnosis not present

## 2020-11-29 DIAGNOSIS — R059 Cough, unspecified: Secondary | ICD-10-CM | POA: Diagnosis not present

## 2020-11-29 DIAGNOSIS — N179 Acute kidney failure, unspecified: Secondary | ICD-10-CM | POA: Diagnosis not present

## 2020-11-29 DIAGNOSIS — R7881 Bacteremia: Secondary | ICD-10-CM | POA: Diagnosis not present

## 2020-11-29 DIAGNOSIS — H401133 Primary open-angle glaucoma, bilateral, severe stage: Secondary | ICD-10-CM | POA: Diagnosis not present

## 2020-11-29 DIAGNOSIS — I76 Septic arterial embolism: Secondary | ICD-10-CM | POA: Diagnosis not present

## 2020-11-29 DIAGNOSIS — I739 Peripheral vascular disease, unspecified: Secondary | ICD-10-CM | POA: Diagnosis not present

## 2020-11-29 DIAGNOSIS — I83029 Varicose veins of left lower extremity with ulcer of unspecified site: Secondary | ICD-10-CM | POA: Diagnosis not present

## 2020-11-29 DIAGNOSIS — I83019 Varicose veins of right lower extremity with ulcer of unspecified site: Secondary | ICD-10-CM | POA: Diagnosis not present

## 2020-11-29 DIAGNOSIS — D696 Thrombocytopenia, unspecified: Secondary | ICD-10-CM | POA: Diagnosis not present

## 2020-11-29 DIAGNOSIS — I87312 Chronic venous hypertension (idiopathic) with ulcer of left lower extremity: Secondary | ICD-10-CM | POA: Diagnosis not present

## 2020-11-29 DIAGNOSIS — J189 Pneumonia, unspecified organism: Secondary | ICD-10-CM | POA: Diagnosis not present

## 2020-11-29 DIAGNOSIS — M6281 Muscle weakness (generalized): Secondary | ICD-10-CM | POA: Diagnosis not present

## 2020-11-29 DIAGNOSIS — I87319 Chronic venous hypertension (idiopathic) with ulcer of unspecified lower extremity: Secondary | ICD-10-CM | POA: Diagnosis not present

## 2020-11-29 DIAGNOSIS — Z66 Do not resuscitate: Secondary | ICD-10-CM | POA: Diagnosis not present

## 2020-11-29 DIAGNOSIS — F411 Generalized anxiety disorder: Secondary | ICD-10-CM | POA: Diagnosis not present

## 2020-11-29 LAB — CBC WITH DIFFERENTIAL/PLATELET
Abs Immature Granulocytes: 0.03 10*3/uL (ref 0.00–0.07)
Basophils Absolute: 0 10*3/uL (ref 0.0–0.1)
Basophils Relative: 0 %
Eosinophils Absolute: 0.2 10*3/uL (ref 0.0–0.5)
Eosinophils Relative: 4 %
HCT: 25.6 % — ABNORMAL LOW (ref 36.0–46.0)
Hemoglobin: 8.1 g/dL — ABNORMAL LOW (ref 12.0–15.0)
Immature Granulocytes: 1 %
Lymphocytes Relative: 27 %
Lymphs Abs: 1.3 10*3/uL (ref 0.7–4.0)
MCH: 31.8 pg (ref 26.0–34.0)
MCHC: 31.6 g/dL (ref 30.0–36.0)
MCV: 100.4 fL — ABNORMAL HIGH (ref 80.0–100.0)
Monocytes Absolute: 0.6 10*3/uL (ref 0.1–1.0)
Monocytes Relative: 13 %
Neutro Abs: 2.8 10*3/uL (ref 1.7–7.7)
Neutrophils Relative %: 55 %
Platelets: 294 10*3/uL (ref 150–400)
RBC: 2.55 MIL/uL — ABNORMAL LOW (ref 3.87–5.11)
RDW: 15 % (ref 11.5–15.5)
WBC: 5.1 10*3/uL (ref 4.0–10.5)
nRBC: 0 % (ref 0.0–0.2)

## 2020-11-29 LAB — VANCOMYCIN, TROUGH: Vancomycin Tr: 19 ug/mL (ref 15–20)

## 2020-11-29 LAB — VANCOMYCIN, PEAK: Vancomycin Pk: 35 ug/mL (ref 30–40)

## 2020-11-29 LAB — SARS CORONAVIRUS 2 (TAT 6-24 HRS): SARS Coronavirus 2: NEGATIVE

## 2020-11-29 MED ORDER — HEPARIN SOD (PORK) LOCK FLUSH 100 UNIT/ML IV SOLN
250.0000 [IU] | INTRAVENOUS | Status: AC | PRN
  Administered 2020-11-29: 250 [IU]
  Filled 2020-11-29: qty 2.5

## 2020-11-29 MED ORDER — VANCOMYCIN IV (FOR PTA / DISCHARGE USE ONLY): 500.0000 mg | Freq: Two times a day (BID) | INTRAVENOUS | 0 refills | Status: AC

## 2020-11-29 NOTE — Progress Notes (Addendum)
Occupational Therapy Treatment Patient Details Name: [name] [name] MRN: [id number] DOB: [birth year] Today's Date: 11/29/2020    History of present illness Pt is an 80 yo female admitted from [facility] with  sepsis from pnemonia and chest pain.  Pt  was admitted to [facility] in Feb and April with similar symptoms and went to SNF after most recent admit. 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 Pt lives alone.   OT comments  Pt making steady progress towards OT goals this session. Pt continues to present with increased pain( BLEs), decreased activity toleance and generalized deconditioning. Pt able to progress OOB to [facility] for toileting with MIN A, set- up assist for anterior pericare. Pt additionally able to sit EOB for grooming tasks needing MAX A for thoroughness. Noted some mild nystagmus when pt returning to supine as pt reports vertigo at baseline, symptom resolved shortly once HOB elevated.  Pt on 1.5 L upon arrival 98% at rest, doffed O2 during session with SpO2 93% on RA. Pt would continue to benefit from skilled occupational therapy while admitted and after d/c to address the below listed limitations in order to improve overall functional mobility and facilitate independence with BADL participation. DC plan remains appropriate, will follow acutely per POC.      Follow Up Recommendations  SNF;Supervision/Assistance - 24 hour    Equipment Recommendations  Other (comment) (tbd)    Recommendations for Other Services      Precautions / Restrictions Precautions Precautions: Fall Precaution Comments: monitor O2 Restrictions Weight Bearing Restrictions: No Other Position/Activity Restrictions: Pt has pain with weightbearing in BLEs.       Mobility Bed Mobility Overal bed mobility: Needs Assistance Bed Mobility: Supine to Sit;Sit to Supine     Supine to sit: Min guard;HOB  elevated Sit to supine: Min assist   General bed mobility comments: min guard to transition from EOB>supine for safety, light MIN A to elevate BLEs back to bed    Transfers Overall transfer level: Needs assistance Equipment used: 1 person hand held assist Transfers: Sit to/from [facility] Sit to Stand: Min assist Stand pivot transfers: Min assist       General transfer comment: MIN A for sit<>stand from EOB, MIN hand held assist to pivot to<>from BSC, steadying assist and balance needed on R side    Balance Overall balance assessment: Needs assistance Sitting-balance support: Feet supported;No upper extremity supported Sitting balance-Leahy Scale: Fair     Standing balance support: During functional activity;Single extremity supported Standing balance-Leahy Scale: Poor Standing balance comment: reliant on UE support                           ADL either performed or assessed with clinical judgement   ADL Overall ADL's : Needs assistance/impaired     Grooming: Brushing hair;Maximal assistance;Sitting Grooming Details (indicate cue type and reason): MAXA for thoroughness to brush hair and put up in ponytail d/t impaired functional use of LUE             Lower Body Dressing: Total assistance;Bed level Lower Body Dressing Details (indicate cue type and reason): to don socks Toilet Transfer: Minimal assistance;Stand-pivot;BSC Toilet Transfer Details (indicate cue type and reason): MIN A for steadying assist to pivot to [facility] Toileting- Clothing Manipulation and Hygiene: Supervision/safety;Set up;Sitting/lateral lean       Functional mobility during ADLs: Minimal assistance (stand  pivot transfer only) General ADL Comments: pt continues to present with increased pain( BLEs), decreased activity toleance and generalized deconditioning     Vision       Perception     Praxis      Cognition Arousal/Alertness: Awake/alert Behavior During  Therapy: WFL for tasks assessed/performed Overall Cognitive Status: No family/caregiver present to determine baseline cognitive functioning                                 General Comments: overall WFL for mobility tasks        Exercises Exercises: General Lower Extremity;Other exercises General Exercises - Lower Extremity Ankle Circles/Pumps: Both;10 reps;Supine;AROM    Shoulder Instructions       General Comments BLE wounds wrapped, pt on 1.5 L upon arrival 98% at rest, doffed O2 during session with SpO2 93% on RA    Pertinent Vitals/ Pain       Pain Assessment: 0-10 Pain Score: 5  Faces Pain Scale: Hurts a little bit Pain Location: BLEs Pain Descriptors / Indicators: Discomfort;Tender;Sore Pain Intervention(s): Limited activity within patient's tolerance;Monitored during session;Repositioned  Home Living                                          Prior Functioning/Environment              Frequency  Min 2X/week        Progress Toward Goals  OT Goals(current goals can now be found in the care plan section)  Progress towards OT goals: Progressing toward goals  Acute Rehab OT Goals Patient Stated Goal: to go to rehab OT Goal Formulation: With patient Time For Goal Achievement: 12/07/20 Potential to Achieve Goals: Good  Plan Discharge plan remains appropriate;Frequency remains appropriate    Co-evaluation                 AM-PAC OT "6 Clicks" Daily Activity     Outcome Measure   Help from another person eating meals?: None Help from another person taking care of personal grooming?: A Little Help from another person toileting, which includes using toliet, bedpan, or urinal?: A Little Help from another person bathing (including washing, rinsing, drying)?: A Lot Help from another person to put on and taking off regular upper body clothing?: A Little Help from another person to put on and taking off regular lower body  clothing?: Total 6 Click Score: 16    End of Session Equipment Utilized During Treatment: Gait belt;Oxygen;Other (comment) (BSC)  OT Visit Diagnosis: Unsteadiness on feet (R26.81);Other abnormalities of gait and mobility (R26.89);Muscle weakness (generalized) (M62.81);Other symptoms and signs involving cognitive function;Pain Pain - Right/Left:  (bilateral) Pain - part of body:  (BLEs)   Activity Tolerance Patient tolerated treatment well   Patient Left in bed;with call bell/phone within reach;with bed alarm set   Nurse Communication Mobility status;Other (comment) (needs new purewick)        Time: 1019-1050 OT Time Calculation (min): 31 min  Charges: OT General Charges $OT Visit: 1 Visit OT Treatments $Self Care/Home Management : 23-37 mins [name] Droll., COTA/L Acute Rehabilitation Services [phone]    [name] [name] 11/29/2020, 12:46 PM

## 2020-11-29 NOTE — TOC Transition Note (Addendum)
Transition of Care [facility]) - CM/SW Discharge Note *Discharged back to [facility] *Number for Report: [phone] *Room Number 122A   Patient Details  Name: [name] [name] MRN: [id number] Date of Birth: 1941-03-01  Transition of Care [facility]) CM/SW Contact:  Sable Feil, LCSW Phone Number: 11/29/2020, 5:41 PM   Clinical Narrative:  Patient medically stable for discharge and returning to [facility] to continue rehab. Insurance authorization received.Discharge clinicals transmitted to facility. Contacted daughter [name] [phone]) and she informed CSW that her mother had called and let her know.    Final next level of care: [facility]) Barriers to Discharge: Barriers Resolved   Patient Goals and CMS Choice Patient states their goals for this hospitalization and ongoing recovery are:: To return to [place] Medicare.gov Compare Post Acute Care list provided to:: Other (Comment Required) (not needed as patient returning to [facility]) Choice offered to / list presented to : NA  Discharge Placement              Patient chooses bed at: [facility] Patient to be transferred to facility by: [place] Name of family member notified: Daughter [name] - [phone] Patient and family notified of of transfer: 11/29/20  Discharge Plan and Services     Post Acute Care Choice: [place]                              Social Determinants of Health (SDOH) Interventions  No SDOH interventions requested or needed at discharge   Readmission Risk Interventions No flowsheet data found.

## 2020-11-29 NOTE — Care Management Important Message (Signed)
Important Message  Patient Details  Name: ROBY SPALLA MRN: [id number] Date of Birth: 15-Nov-1940   Medicare Important Message Given:  Yes     Barb Merino Campbell 11/29/2020, 2:14 PM

## 2020-11-29 NOTE — Progress Notes (Signed)
Physical Therapy Treatment Patient Details Name: [name] [name] MRN: [id number] DOB: [birth year] Today's Date: 11/29/2020    History of Present Illness Pt is an 80 yo female admitted from [facility] with  sepsis from pnemonia and chest pain.  Pt  was admitted to [facility] in Feb and April with similar symptoms and went to SNF after most recent admit. 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 Pt lives alone.    PT Comments    Pt reports just working with OT and that she is very tired, however she would be willing to work on bed exercises. Pt eager to show PT LE and reports how much better they look. Pt reports L side pain due to RA and gout, which is limiting the number of exercises she can do. Pt is looking forward to getting stronger at Rehab so she can go live with her grandson. D/c plan remains appropriate and pt is hopeful for d/c later this afternoon.    Follow Up Recommendations  SNF     Equipment Recommendations  Other (comment) (has necessary equipment)       Precautions / Restrictions Precautions Precautions: Fall Precaution Comments: monitor O2 Restrictions Weight Bearing Restrictions: No Other Position/Activity Restrictions: Pt has pain with weightbearing in BLEs.    Mobility  Bed Mobility               General bed mobility comments: refuses OOB as she has just worked with OT, but volunteers she would like to do some exercises              Cognition Arousal/Alertness: Awake/alert Behavior During Therapy: WFL for tasks assessed/performed Overall Cognitive Status: No family/caregiver present to determine baseline cognitive functioning                                 General Comments: reports she is going to live with grandson after rehab      Exercises General Exercises - Lower Extremity Ankle Circles/Pumps: AAROM;Both;10 reps;Supine  (utilizes other foot to increase plantar/dorsiflexion) Gluteal Sets: AROM;Both;10 reps;Supine Heel Slides: AROM;Right;10 reps;Supine;Left;5 reps Straight Leg Raises: AROM;Right;10 reps;Supine;Left;5 reps Other Exercises Other Exercises: Hip IR/ER both 10 reps, pt reports helps with L hip pain    General Comments General comments (skin integrity, edema, etc.): BLE wounds wrapped, VSS on 2L O2      Pertinent Vitals/Pain Pain Assessment: Faces Faces Pain Scale: Hurts a little bit Pain Location: L UE Pain Descriptors / Indicators: Grimacing;Tender Pain Intervention(s): Limited activity within patient's tolerance;Monitored during session           PT Goals (current goals can now be found in the care plan section) Acute Rehab PT Goals PT Goal Formulation: With patient Time For Goal Achievement: 12/07/20 Potential to Achieve Goals: Good Progress towards PT goals: Not progressing toward goals - comment (reports fatigue from working with OT)    Frequency    Min 2X/week      PT Plan Current plan remains appropriate       AM-PAC PT "6 Clicks" Mobility   Outcome Measure  Help needed turning from your back to your side while in a flat bed without using bedrails?: A Lot Help needed moving from lying on your back to sitting on the side of a flat bed without using bedrails?: A Lot Help needed moving to and from a  bed to a chair (including a wheelchair)?: A Lot Help needed standing up from a chair using your arms (e.g., wheelchair or bedside chair)?: A Lot Help needed to walk in hospital room?: A Lot Help needed climbing 3-5 steps with a railing? : Total 6 Click Score: 11    End of Session Equipment Utilized During Treatment: Oxygen Activity Tolerance: Patient tolerated treatment well Patient left: with call bell/phone within reach;in chair;with chair alarm set Nurse Communication: Mobility status PT Visit Diagnosis: Unsteadiness on feet (R26.81);Muscle weakness (generalized)  (M62.81);Difficulty in walking, not elsewhere classified (R26.2)     Time: 1287-8676 PT Time Calculation (min) (ACUTE ONLY): 17 min  Charges:  $Therapeutic Exercise: 8-22 mins                     Keshonda Monsour B. Migdalia Dk PT, DPT Acute Rehabilitation Services Pager [phone] Office [phone]    [place] 11/29/2020, 11:36 AM

## 2020-11-29 NOTE — Progress Notes (Signed)
Attempted to call for report at [facility], no answer.

## 2020-11-29 NOTE — TOC Progression Note (Signed)
Transition of Care [facility]) - Progression Note    Patient Details  Name: [name] [name] MRN: [id number] Date of Birth: 1940-10-06  Transition of Care [facility]) CM/SW Cedar Nordin, [place] Phone Number: 11/29/2020, 8:56 AM  Clinical Narrative:   CSW received call back from [facility] with approval for SNF and PTAR. SNF has been approved for 7 days, auth number [id number]. Of note, patient is on day 37 out of 100 of her SNF days. PTAR was also approved, auth number [id number].    Expected Discharge Plan: [place] Barriers to Discharge: Continued Medical Work up,Insurance Authorization  Expected Discharge Plan and Services Expected Discharge Plan: [place] Choice: [place] arrangements for the past 2 months: Single Family Home                                       Social Determinants of Health (SDOH) Interventions    Readmission Risk Interventions No flowsheet data found.

## 2020-11-29 NOTE — Progress Notes (Signed)
Report given to Roshanda,RN. Patient discharged  to [facility] via [place] transport.  Belongings sent with patient.

## 2020-11-29 NOTE — Discharge Summary (Signed)
Physician Discharge Summary  [name] [name] WGY:[id number] DOB: 1940-08-23 DOA: 11/21/2020  PCP: [name], MD  Admit date: 11/21/2020 Discharge date: 11/29/2020  Admitted From: home Disposition:  SNF  Recommendations for Outpatient Follow-up:  1. Follow up with ID in 1-2 weeks 2. Please obtain BMP/CBC in one week 3. She will complete her antibiotic  coverage on 12/10/2020   Home Health:No Quipment/Devices:None  Discharge Condition:Stable CODE STATUS:DNR Diet recommendation: Heart Healthy   Brief/Interim Summary: 80 y.o. adult past medical history significant for essential hypertension PVD PMR and congenital solitary kidney, also recently discharged from the hospital on 11/04/2020 for MRSA bacteremia during that hospitalization she underwent a TEE that showed no signs of vegetation ID recommended daptomycin for 4 weeks through her PICC line.  She relates she was woken up from sleep with a cough left-sided chest pain and shortness of breath, she was found by staff to be satting 86% on room air and a temperature of 101.3, placed on 3 L of oxygen saturations improved.  Chest x-ray was concerning for possible left upper lobe infiltrates.  ID was consulted TEE was done that showed no vegetation, ID recommended to continue for 42 days of IV antibiotic vancomycin.  Discharge Diagnoses:  Principal Problem:   Sepsis due to pneumonia [facility]) Active Problems:   Polymyalgia rheumatica ([place])   Peripheral vascular disease ([place])   Essential hypertension   Septic embolism ([place])   DNR (do not resuscitate)  Sepsis due to possible MRSA pneumonia: Her antibiotics were broadened to IV vancomycin TEE showed no vegetation. Infectious disease was consulted recommended IV vancomycin for 42 days, stop dated 12/11/2019.  Acute respiratory failure with hypoxia probably secondary to his pneumonia: This resolved with IV antibiotics.  History of MRSA bacteremia: ID was consulted TEE was done that showed no  vegetation CT angio of the chest showed possible septic pulmonary emboli. Blood cultures were repeated which have remained negative. ID recommended to broaden her antibiotic coverage and duration of antibiotics see above for further details.  History of sepsis with pulmonary septic emboli: Blood cultures were drawn which remain negative.  Bilateral lower extremity wound: Sign care was consulted and recommended applying Xeroform gauze to cover blisters.  Macrocytic anemia: Hemoglobin remained stable will need to recheck CBC as an outpatient.  Essential hypertension: Remained stable no change made to her medication.  Peripheral vascular disease: No events continue aspirin and Plavix.  Congenital solitary kidneys: Sign her creatinine remained at baseline.  PMR: She is follow-up with rheumatology, Dr. [name] saw her on 11/15/2020 who continue to hold her prednisone and she has a follow-up appointment in 12/13/2020 as an outpatient.  Left upper extremity swelling of the shoulder and pain: X-ray showed no fracture dislocation or effusion.  Slight she was put on a sling. Upper extremity Doppler was done that was negative for DVT. She was continued on Tylenol for pain control which helped.     Discharge Instructions  Discharge Instructions    [facility] pharmacist to adjust dose for Vancomycin, Aminoglycosides and other anti-infective therapies as requested by physician.   Complete by: As directed    [facility] to provide Cath Flo 51m   Complete by: As directed    Administer for PICC line occlusion and as ordered by physician for other access device issues.   Anaphylaxis Kit: Provided to treat any anaphylactic reaction to the medication being provided to the patient if First Dose or when requested by physician   Complete by: As directed  Epinephrine 14m/ml vial / amp: Administer 0.374m(0.52m62msubcutaneously once for moderate to severe anaphylaxis, nurse to call  physician and pharmacy when reaction occurs and call 911 if needed for immediate care   Diphenhydramine 104m29m IV vial: Administer 25-104mg68mIM PRN for first dose reaction, rash, itching, mild reaction, nurse to call physician and pharmacy when reaction occurs   Sodium Chloride 0.9% NS 500ml 41mAdminister if needed for hypovolemic blood pressure drop or as ordered by physician after call to physician with anaphylactic reaction   Change dressing on IV access line weekly and PRN   Complete by: As directed    Diet - low sodium heart healthy   Complete by: As directed    Discharge wound care:   Complete by: As directed    Per wound care nurse   Flush IV access with Sodium Chloride 0.9% and Heparin 10 units/ml or 100 units/ml   Complete by: As directed    Home infusion instructions - [facility]   Complete by: As directed    Instructions: Flush IV access with Sodium Chloride 0.9% and Heparin 10units/ml or 100units/ml   Change dressing on IV access line: Weekly and PRN   Instructions Cath Flo 2mg: A104mnister for PICC Line occlusion and as ordered by physician for other access device   [facility] pharmacist to adjust dose for: Vancomycin, Aminoglycosides and other anti-infective therapies as requested by physician   Increase activity slowly   Complete by: As directed    Method of administration may be changed at the discretion of home infusion pharmacist based upon assessment of the patient and/or caregiver's ability to self-administer the medication ordered   Complete by: As directed      Allergies as of 11/29/2020      Reactions   Statins    Gabapentin Itching   Was deleted since patient was taking, but now experiencing itching again.    Statins Swelling, Rash, Other (See Comments)   Swelling involving tongue; also causes muscle pain      Medication List    STOP taking these medications   tamsulosin 0.4 MG Caps capsule Commonly known as: FLOMAX     TAKE  these medications   acetaminophen 500 MG tablet Commonly known as: TYLENOL Take 500 mg by mouth every 8 (eight) hours as needed for moderate pain.   albuterol 108 (90 Base) MCG/ACT inhaler Commonly known as: VENTOLIN HFA Inhale 2 puffs into the lungs every 6 (six) hours as needed for cough, wheezing or shortness of breath.   amLODipine 2.5 MG tablet Commonly known as: NORVASC Take 1 tablet (2.5 mg total) by mouth daily.   aspirin 81 MG EC tablet Take 1 tablet (81 mg total) by mouth daily.   busPIRone 15 MG tablet Commonly known as: BUSPAR Take 1 tablet (15 mg total) by mouth 3 (three) times daily.   cholecalciferol 1000 units tablet Commonly known as: VITAMIN D Take 1,000 Units by mouth daily.   clopidogrel 75 MG tablet Commonly known as: PLAVIX Take 1 tablet (75 mg total) by mouth every morning.   daptomycin  IVPB Commonly known as: CUBICIN Inject 500 mg into the vein daily for 25 days. Indication:  MRSA Bacteremia First Dose: Yes Last Day of Therapy:  11/26/20 Labs - Once weekly:  CBC/D, BMP, and CPK Labs - Every other week:  ESR and CRP Method of administration: IV Push Method of administration may be changed at the discretion of home infusion pharmacist based upon assessment of  the patient and/or caregiver's ability to self-administer the medication ordered.   divalproex 125 MG DR tablet Commonly known as: DEPAKOTE Take 250 mg by mouth 2 (two) times daily.   Dorzolamide HCl-Timolol Mal PF 2-0.5 % Soln Place 1 drop into both eyes 2 (two) times daily.   ferrous sulfate 325 (65 FE) MG tablet Take 325 mg by mouth daily.   furosemide 20 MG tablet Commonly known as: LASIX Take 20 mg by mouth every other day as needed.   gabapentin 100 MG capsule Commonly known as: NEURONTIN Take 200 mg by mouth 3 (three) times daily.   Latanoprostene Bunod 0.024 % Soln Place 1 drop into both eyes at bedtime.   mirtazapine 15 MG tablet Commonly known as: REMERON Take 1 tablet  (15 mg total) by mouth at bedtime.   pantoprazole 40 MG tablet Commonly known as: PROTONIX Take 40 mg by mouth daily.   traMADol 50 MG tablet Commonly known as: ULTRAM Take 1 tablet (50 mg total) by mouth 3 (three) times daily as needed for up to 4 doses for moderate pain.   vancomycin  IVPB Inject 500 mg into the vein every 12 (twelve) hours for 14 days. Indication:  MRSA bacteremia First Dose: No Last Day of Therapy:  12/10/20 Labs - Sunday/Monday:  CBC/D, BMP, and vancomycin trough. Labs - Thursday:  BMP and vancomycin trough Labs - Every other week:  ESR and CRP Method of administration:Elastomeric Method of administration may be changed at the discretion of the patient and/or caregiver's ability to self-administer the medication ordered.   VITAMIN B 12 PO Take 1 tablet by mouth daily.   VITAMIN C PO Take 1 tablet by mouth daily.            Discharge Care Instructions  (From admission, onward)         Start     Ordered   11/29/20 0000  Change dressing on IV access line weekly and PRN  (Home infusion instructions - [facility] )        05 /23/22 1002   11/29/20 0000  Discharge wound care:       Comments: Per wound care nurse   11/29/20 1002          Allergies  Allergen Reactions  . Statins   . Gabapentin Itching    Was deleted since patient was taking, but now experiencing itching again.   . Statins Swelling, Rash and Other (See Comments)    Swelling involving tongue; also causes muscle pain    Consultations: Infectious disease  Procedures/Studies: CT Head Wo Contrast  Result Date: 11/21/2020 CLINICAL DATA:  Headache Intracranial hemorrhage suspected EXAM: CT HEAD WITHOUT CONTRAST TECHNIQUE: Contiguous axial images were obtained from the base of the skull through the vertex without intravenous contrast. COMPARISON:  09/01/2020 FINDINGS: Brain: No evidence of acute infarction, hemorrhage, hydrocephalus, extra-axial collection or mass lesion/mass  effect. Periventricular white matter hypodensity is a nonspecific finding, but most commonly relates to chronic ischemic small vessel disease. Vascular: No hyperdense vessel or unexpected calcification. Skull: Normal. Negative for fracture or focal lesion. Sinuses/Orbits: No acute finding. Other: None. IMPRESSION: No acute intracranial abnormality. Electronically Signed   By: Miachel Roux M.D.   On: 11/21/2020 14:26   CT Chest Wo Contrast  Result Date: 11/21/2020 CLINICAL DATA:  80 year old who is currently on IV antibiotics for MRSA infection in the lower extremities, found by the nursing home staff earlier today to be hypotensive, tachycardic and hypoxic with oxygen saturation of  82% on room air. Acute cough. Chest pain while coughing. EXAM: CT CHEST WITHOUT CONTRAST TECHNIQUE: Multidetector CT imaging of the chest was performed following the standard protocol without IV contrast. COMPARISON:  CTA chest 10/25/2020 and earlier. FINDINGS: Cardiovascular: Stable normal heart size. Mild aortic valvular calcification. Moderate to severe three-vessel coronary atherosclerosis. No pericardial effusion. Moderate to severe atherosclerosis involving the thoracic and upper abdominal aorta without evidence of aneurysm. Atherosclerosis involving the proximal great vessels. Mediastinum/Nodes: No pathologically enlarged mediastinal, hilar or axillary lymph nodes. No mediastinal masses. Normal-appearing esophagus. Approximate 2.0 cm nodule involving the LEFT lobe of the thyroid gland, stable dating back to 2015, confirming benignity. Lungs/Pleura: Since the CT last month, development of peripheral airspace opacities throughout both lungs, predominantly in the upper lobes, and development of scattered BILATERAL lung nodules. A few of the peripheral opacities have central necrosis. Scar/atelectasis deep in the lower lobes bilaterally, unchanged. Central airways patent with severe bronchial wall thickening. Upper Abdomen:  Visualized LEFT kidney atrophic. Mild pancreatic atrophy. Visualized upper abdomen otherwise unremarkable for the unenhanced technique. Opaque ingested material in the stomach. Musculoskeletal: Osseous demineralization. Degenerative disc disease and spondylosis throughout the thoracic spine and the upper lumbar spine. Upper thoracic dextroscoliosis and thoracolumbar levoscoliosis. IMPRESSION: 1. New peripheral airspace opacities throughout both lungs with an upper lobe predominance and new scattered BILATERAL lung nodule since the prior CT 1 month ago. Some of the peripheral opacities demonstrate central necrosis. The distribution and the time course suggests septic emboli may be the etiology. 2. Stable scar/atelectasis deep in the lower lobes bilaterally. 3. 2.0 cm LEFT lobe thyroid nodule which has been stable dating back to at 59. Stability for greater than 5 years implies benignity; no biopsy or followup indicated. (Ref: J Am Coll Radiol. 2015 Feb;12(2): 143-50). Aortic Atherosclerosis (ICD10-I70.0). Electronically Signed   By: Evangeline Dakin M.D.   On: 11/21/2020 12:10   MR LUMBAR SPINE WO CONTRAST  Result Date: 11/25/2020 CLINICAL DATA:  Back pain EXAM: MRI LUMBAR SPINE WITHOUT CONTRAST TECHNIQUE: Multiplanar, multisequence MR imaging of the lumbar spine was performed. No intravenous contrast was administered. COMPARISON:  Lumbar spine MRI 06/23/2017 FINDINGS: Segmentation:  Standard. Alignment:  There is grade 1 retrolisthesis at L1-2. Vertebrae:  No fracture, evidence of discitis, or bone lesion. Conus medullaris and cauda equina: Conus extends to the L1 level. Conus and cauda equina appear normal. Paraspinal and other soft tissues: Left kidney is atrophic. Disc levels: T10-L1 levels are imaged only in the sagittal plane. There are small disc bulges at all levels without spinal canal stenosis. L1-L2: Disc bulge with endplate spurring. No spinal canal stenosis. Unchanged moderate bilateral neural  foraminal stenosis. L2-L3: Disc desiccation with minimal bulge. Normal facets. No spinal canal stenosis. No neural foraminal stenosis. L3-L4: Disc bulge and endplate spurring. Left lateral recess narrowing without central spinal canal stenosis. Mild right and slightly progressed moderate left neural foraminal stenosis. L4-L5: Disc bulge with mild facet hypertrophy. No spinal canal stenosis. Unchanged moderate bilateral neural foraminal stenosis. L5-S1: Disc height loss without herniation. No spinal canal stenosis. No neural foraminal stenosis. Visualized sacrum: Normal. IMPRESSION: 1. Unchanged moderate bilateral neural foraminal stenosis at L1-L2 and L4-L5. 2. Slight worsening of moderate left L3-4 neural foraminal stenosis. 3. No central spinal canal stenosis. Electronically Signed   By: Ulyses Jarred M.D.   On: 11/25/2020 19:02   DG Chest Port 1 View  Result Date: 11/21/2020 CLINICAL DATA:  Questionable sepsis. EXAM: PORTABLE CHEST 1 VIEW COMPARISON:  10/23/2020 FINDINGS: Patient is a  new RIGHT-sided PICC line, tip overlying the superior vena cava. Heart size is normal. Interval development of patchy opacity in the LEFT UPPER lobe and increased interstitial markings particularly at the bases. Remote cervical fusion. IMPRESSION: New LEFT UPPER lobe infiltrate and bibasilar atelectasis or developing LOWER lobe infiltrates. Electronically Signed   By: Nolon Nations M.D.   On: 11/21/2020 11:27   DG Shoulder Left  Result Date: 11/26/2020 CLINICAL DATA:  Pain and limited range of motion.  No recent trauma. EXAM: LEFT SHOULDER - 2+ VIEW COMPARISON:  None FINDINGS: Mild and expected for age degenerative changes of the undersurface of the acromion and humeral head. No acute fracture or dislocation. Cervical spine fixation. Visualized portion of the left hemithorax is normal. IMPRESSION: No acute osseous abnormality. Electronically Signed   By: Abigail Miyamoto M.D.   On: 11/26/2020 13:19   ECHOCARDIOGRAM  COMPLETE  Result Date: 11/22/2020    ECHOCARDIOGRAM REPORT   Patient Name:   [name] [name] Date of Exam: 11/22/2020 Medical Rec #:  [id number]   Height:       60.0 in Accession #:    [id number]  Weight:       138.9 lb Date of Birth:  07-May-1941   BSA:          1.599 m Patient Age:    80 years    BP:           140/67 mmHg Patient Gender: F           HR:           107 bpm. Exam Location:  Inpatient Procedure: 2D Echo, Cardiac Doppler and Color Doppler Indications:    Endocarditis I38  History:        Patient has prior history of Echocardiogram examinations, most                 recent 11/01/2020. Signs/Symptoms:Murmur; Risk                 Factors:Hypertension and Sleep Apnea. GERD. CKD.  Sonographer:    Jonelle Sidle Dance Referring Phys: [place]  1. Current study is inadequate to evaluate for endocarditis. Recomm TEE if clinically indicated.  2. Left ventricular ejection fraction, by estimation, is 70 to 75%. The left ventricle has hyperdynamic function. The left ventricle has no regional wall motion abnormalities. Indeterminate diastolic filling due to E-A fusion.  3. Right ventricular systolic function is low normal. The right ventricular size is normal.  4. The mitral valve is normal in structure. Mild mitral valve regurgitation.  5. The aortic valve was not well visualized. Aortic valve regurgitation is not visualized. Mild to moderate aortic valve sclerosis/calcification is present, without any evidence of aortic stenosis.  6. The inferior vena cava is normal in size with greater than 50% respiratory variability, suggesting right atrial pressure of 3 mmHg. Comparison(s): The left ventricular function is unchanged. FINDINGS  Left Ventricle: Left ventricular ejection fraction, by estimation, is 70 to 75%. The left ventricle has hyperdynamic function. The left ventricle has no regional wall motion abnormalities. 3D left ventricular ejection fraction analysis performed but not  reported based on  interpreter judgement due to suboptimal quality. The left ventricular internal cavity size was normal in size. There is no left ventricular hypertrophy. Indeterminate diastolic filling due to E-A fusion. Right Ventricle: The right ventricular size is normal. Right vetricular wall thickness was not assessed. Right ventricular systolic function is low normal. Left Atrium: Left atrial size  was normal in size. Right Atrium: Right atrial size was normal in size. Pericardium: Trivial pericardial effusion is present. Mitral Valve: The mitral valve is normal in structure. Mild mitral valve regurgitation. Tricuspid Valve: The tricuspid valve is normal in structure. Tricuspid valve regurgitation is mild. Aortic Valve: The aortic valve was not well visualized. Aortic valve regurgitation is not visualized. Mild to moderate aortic valve sclerosis/calcification is present, without any evidence of aortic stenosis. Pulmonic Valve: The pulmonic valve was not well visualized. Pulmonic valve regurgitation is not visualized. Aorta: The aortic root and ascending aorta are structurally normal, with no evidence of dilitation. Venous: The inferior vena cava is normal in size with greater than 50% respiratory variability, suggesting right atrial pressure of 3 mmHg. IAS/Shunts: No atrial level shunt detected by color flow Doppler.  LEFT VENTRICLE PLAX 2D LVIDd:         3.60 cm  Diastology LVIDs:         3.00 cm  LV e' medial:    8.59 cm/s LV PW:         0.80 cm  LV E/e' medial:  11.9 LV IVS:        1.00 cm  LV e' lateral:   7.29 cm/s LVOT diam:     2.10 cm  LV E/e' lateral: 14.0 LV SV:         59 LV SV Index:   37 LVOT Area:     3.46 cm  RIGHT VENTRICLE             IVC RV Basal diam:  2.20 cm     IVC diam: 1.90 cm RV S prime:     17.40 cm/s TAPSE (M-mode): 1.9 cm LEFT ATRIUM             Index       RIGHT ATRIUM          Index LA diam:        3.20 cm 2.00 cm/m  RA Area:     7.53 cm LA Vol (A2C):   48.3 ml 30.21 ml/m RA Volume:   11.30 ml  7.07 ml/m LA Vol (A4C):   16.4 ml 10.26 ml/m LA Biplane Vol: 28.1 ml 17.58 ml/m  AORTIC VALVE LVOT Vmax:   87.70 cm/s LVOT Vmean:  58.100 cm/s LVOT VTI:    0.169 m  AORTA Ao Root diam: 3.00 cm Ao Asc diam:  3.10 cm MITRAL VALVE MV Area (PHT): 3.08 cm     SHUNTS MV Decel Time: 246 msec     Systemic VTI:  0.17 m MV E velocity: 102.00 cm/s  Systemic Diam: 2.10 cm MV A velocity: 151.00 cm/s MV E/A ratio:  0.68 Dorris Carnes MD Electronically signed by Dorris Carnes MD Signature Date/Time: 11/22/2020/3:26:14 PM    Final    ECHO TEE  Result Date: 11/25/2020    TRANSESOPHOGEAL ECHO REPORT   Patient Name:   CARLIS BURNSWORTH [place] Date of Exam: 11/25/2020 Medical Rec #:  [id number]   Height:       60.0 in Accession #:    [id number]  Weight:       138.9 lb Date of Birth:  [birth year]   BSA:          1.599 m Patient Age:    47 years    BP:           159/87 mmHg Patient Gender: F           HR:  107 bpm. Exam Location:  Inpatient Procedure: 3D Echo, Transesophageal Echo, Cardiac Doppler and Color Doppler Indications:     Bacteremia  History:         Patient has prior history of Echocardiogram examinations, most                  recent 11/25/2020. Signs/Symptoms:Bacteremia; Risk                  Factors:Hypertension, Dyslipidemia and Sleep Apnea. Setic                  emboli.  Sonographer:     Roseanna Rainbow RDCS Referring Phys:  [id number] [place] [facility] Diagnosing Phys: Fransico Him MD PROCEDURE: After discussion of the risks and benefits of a TEE, an informed consent was obtained from the patient. The transesophogeal probe was passed without difficulty through the esophogus of the patient. Imaged were obtained with the patient in a left lateral decubitus position. Local oropharyngeal anesthetic was provided with Cetacaine. Sedation performed by different physician. The patient was monitored while under deep sedation. Anesthestetic sedation was provided intravenously by Anesthesiology: 43m of Propofol, 671mof Lidocaine. The patient's  vital signs; including heart rate, blood pressure, and oxygen saturation; remained stable throughout the procedure. The patient developed no complications during the procedure. IMPRESSIONS  1. Left ventricular ejection fraction, by estimation, is 60 to 65%. The left ventricle has normal function. The left ventricle has no regional wall motion abnormalities.  2. Right ventricular systolic function is normal. The right ventricular size is normal.  3. No left atrial/left atrial appendage thrombus was detected.  4. The mitral valve is degenerative. There is a very small density on the A2 segment of the anterior MV leaflet by 3D imaging that likely represents calcified degeneration of the mitral valve leaflets. No obvious shaggy vegetations noted. Mild mitral valve regurgitation. No evidence of mitral stenosis.  5. The aortic valve is normal in structure. Aortic valve regurgitation is not visualized. No aortic stenosis is present.  6. There is Moderate (Grade III) layered plaque involving the transverse aorta, ascending aorta and descending aorta.  7. The inferior vena cava is normal in size with greater than 50% respiratory variability, suggesting right atrial pressure of 3 mmHg. Conclusion(s)/Recommendation(s): Normal biventricular function without evidence of hemodynamically significant valvular heart disease. FINDINGS  Left Ventricle: Left ventricular ejection fraction, by estimation, is 60 to 65%. The left ventricle has normal function. The left ventricle has no regional wall motion abnormalities. The left ventricular internal cavity size was normal in size. There is  no left ventricular hypertrophy. Right Ventricle: The right ventricular size is normal. No increase in right ventricular wall thickness. Right ventricular systolic function is normal. Left Atrium: Left atrial size was normal in size. Spontaneous echo contrast was present. No left atrial/left atrial appendage thrombus was detected. Right Atrium: Right  atrial size was normal in size. Pericardium: There is no evidence of pericardial effusion. Mitral Valve: There is a very small density on the A2 segment of the anterior MV leaflet by 3D imaging that likely represents calcified degeneration of the mitral valve leaflets. No obvious shaggy vegetations noted. The mitral valve is degenerative in appearance. Mild mitral valve regurgitation. No evidence of mitral valve stenosis. Tricuspid Valve: The tricuspid valve is normal in structure. Tricuspid valve regurgitation is mild . No evidence of tricuspid stenosis. Aortic Valve: The aortic valve is normal in structure. Aortic valve regurgitation is not visualized. No aortic stenosis is present. Pulmonic  Valve: The pulmonic valve was normal in structure. Pulmonic valve regurgitation is trivial. No evidence of pulmonic stenosis. Aorta: The aortic root is normal in size and structure. There is moderate (Grade III) layered plaque involving the transverse aorta, ascending aorta and descending aorta. Venous: The inferior vena cava is normal in size with greater than 50% respiratory variability, suggesting right atrial pressure of 3 mmHg. IAS/Shunts: No atrial level shunt detected by color flow Doppler. Fransico Him MD Electronically signed by Fransico Him MD Signature Date/Time: 11/25/2020/1:15:56 PM    Final    ECHO TEE  Result Date: 11/01/2020    TRANSESOPHOGEAL ECHO REPORT   Patient Name:   [name] [name] Date of Exam: 11/01/2020 Medical Rec #:  [id number]   Height:       60.0 in Accession #:    [id number]  Weight:       156.7 lb Date of Birth:  1940/12/12   BSA:          1.683 m Patient Age:    64 years    BP:           131/56 mmHg Patient Gender: F           HR:           90 bpm. Exam Location:  Inpatient Procedure: Transesophageal Echo, Cardiac Doppler and Color Doppler Indications:     Bacteremia  History:         Patient has prior history of Echocardiogram examinations, most                  recent 10/25/2020.  Signs/Symptoms:Shortness of Breath; Risk                  Factors:Hypertension.  Sonographer:     Clayton Lefort RDCS (AE) Referring Phys:  [id number] [place] Diagnosing Phys: Fransico Him MD PROCEDURE: After discussion of the risks and benefits of a TEE, an informed consent was obtained from the patient. The transesophogeal probe was passed without difficulty through the esophogus of the patient. Sedation performed by different physician. The patient was monitored while under deep sedation. Anesthestetic sedation was provided intravenously by Anesthesiology: 134.34m of Propofol. Image quality was good. The patient's vital signs; including heart rate, blood pressure, and oxygen saturation; remained stable throughout the procedure. The patient developed no complications during the procedure. IMPRESSIONS  1. Left ventricular ejection fraction, by estimation, is 60 to 65%. The left ventricle has normal function. The left ventricle has no regional wall motion abnormalities.  2. Right ventricular systolic function is normal. The right ventricular size is normal.  3. No left atrial/left atrial appendage thrombus was detected.  4. The mitral valve is normal in structure. Mild mitral valve regurgitation. No evidence of mitral stenosis.  5. The aortic valve is tricuspid. Aortic valve regurgitation is not visualized. Mild aortic valve sclerosis is present, with no evidence of aortic valve stenosis.  6. There is mild (Grade II) layered plaque involving the transverse aorta, descending aorta and ascending aorta.  7. The inferior vena cava is normal in size with greater than 50% respiratory variability, suggesting right atrial pressure of 3 mmHg. Conclusion(s)/Recommendation(s): Normal biventricular function without evidence of hemodynamically significant valvular heart disease. No vegetations noted. FINDINGS  Left Ventricle: Left ventricular ejection fraction, by estimation, is 60 to 65%. The left ventricle has normal function. The  left ventricle has no regional wall motion abnormalities. The left ventricular internal cavity size was normal in size. There is  no left ventricular hypertrophy. Right Ventricle: The right ventricular size is normal. No increase in right ventricular wall thickness. Right ventricular systolic function is normal. Left Atrium: Left atrial size was normal in size. No left atrial/left atrial appendage thrombus was detected. Right Atrium: Right atrial size was normal in size. Pericardium: There is no evidence of pericardial effusion. Mitral Valve: The mitral valve is normal in structure. Mild mitral valve regurgitation. No evidence of mitral valve stenosis. Tricuspid Valve: The tricuspid valve is normal in structure. Tricuspid valve regurgitation is mild . No evidence of tricuspid stenosis. Aortic Valve: The aortic valve is tricuspid. Aortic valve regurgitation is not visualized. Mild aortic valve sclerosis is present, with no evidence of aortic valve stenosis. Pulmonic Valve: The pulmonic valve was normal in structure. Pulmonic valve regurgitation is trivial. No evidence of pulmonic stenosis. Aorta: The aortic root is normal in size and structure. There is mild (Grade II) layered plaque involving the transverse aorta, descending aorta and ascending aorta. Venous: The inferior vena cava is normal in size with greater than 50% respiratory variability, suggesting right atrial pressure of 3 mmHg. IAS/Shunts: The interatrial septum appears to be lipomatous. No atrial level shunt detected by color flow Doppler. Fransico Him MD Electronically signed by Fransico Him MD Signature Date/Time: 11/01/2020/11:42:04 AM    Final (Updated)    VAS [place] UPPER EXTREMITY VENOUS DUPLEX  Result Date: 11/26/2020 UPPER VENOUS STUDY  Patient Name:  TIANA SIVERTSON Wiland  Date of Exam:   11/26/2020 Medical Rec #: [id number]    Accession #:    [id number] Date of Birth: 06-22-1941    Patient Gender: F Patient Age:   16Y Exam Location:  [facility]  Procedure:      VAS [place] UPPER EXTREMITY VENOUS DUPLEX Referring Phys: [id number] Sircy FELIZ ORTIZ --------------------------------------------------------------------------------  Indications: Pain Risk Factors: None identified. Limitations: Poor ultrasound/tissue interface. Comparison Study: No prior studies. Performing Technologist: Oliver Hum RVT  Examination Guidelines: A complete evaluation includes B-mode imaging, spectral Doppler, color Doppler, and power Doppler as needed of all accessible portions of each vessel. Bilateral testing is considered an integral part of a complete examination. Limited examinations for reoccurring indications may be performed as noted.  Right Findings: +----------+------------+---------+-----------+----------+-------+ RIGHT     CompressiblePhasicitySpontaneousPropertiesSummary +----------+------------+---------+-----------+----------+-------+ Subclavian    Full       Yes       Yes                      +----------+------------+---------+-----------+----------+-------+  Left Findings: +----------+------------+---------+-----------+----------+-------+ LEFT      CompressiblePhasicitySpontaneousPropertiesSummary +----------+------------+---------+-----------+----------+-------+ IJV           Full       Yes       Yes                      +----------+------------+---------+-----------+----------+-------+ Subclavian    Full       Yes       Yes                      +----------+------------+---------+-----------+----------+-------+ Axillary      Full       Yes       Yes                      +----------+------------+---------+-----------+----------+-------+ Brachial      Full       Yes       Yes                      +----------+------------+---------+-----------+----------+-------+  Radial        Full                                          +----------+------------+---------+-----------+----------+-------+ Ulnar         Full                                           +----------+------------+---------+-----------+----------+-------+ Cephalic      Full                                          +----------+------------+---------+-----------+----------+-------+ Basilic       Full                                          +----------+------------+---------+-----------+----------+-------+  Summary:  Right: No evidence of thrombosis in the subclavian.  Left: No evidence of deep vein thrombosis in the upper extremity. No evidence of superficial vein thrombosis in the upper extremity.  *See table(s) above for measurements and observations.  Diagnosing physician: Deitra Mayo MD Electronically signed by Deitra Mayo MD on 11/26/2020 at 8:18:20 PM.    Final    [place] EKG SITE RITE  Result Date: 11/01/2020 If Site Rite image not attached, placement could not be confirmed due to current cardiac rhythm.   (Echo, Carotid, EGD, Colonoscopy, ERCP)    Subjective: No complaints  Discharge Exam: Vitals:   11/28/20 2042 11/29/20 0558  BP: 128/75 133/65  Pulse: 83 80  Resp: 19 19  Temp: 98.2 F (36.8 C) 98.1 F (36.7 C)  SpO2: 97% 97%   Vitals:   11/28/20 0852 11/28/20 1711 11/28/20 2042 11/29/20 0558  BP: (!) 163/77 (!) 158/76 128/75 133/65  Pulse: 90 88 83 80  Resp: 18 18 19 19   Temp: 98.1 F (36.7 C) 98 F (36.7 C) 98.2 F (36.8 C) 98.1 F (36.7 C)  TempSrc: Oral Oral Oral Oral  SpO2: 99% 98% 97% 97%  Weight:      Height:        General: Pt is alert, awake, not in acute distress Cardiovascular: RRR, S1/S2 +, no rubs, no gallops Respiratory: CTA bilaterally, no wheezing, no rhonchi Abdominal: Soft, NT, ND, bowel sounds + Extremities: no edema, no cyanosis    The results of significant diagnostics from this hospitalization (including imaging, microbiology, ancillary and laboratory) are listed below for reference.     Microbiology: Recent Results (from the past 240 hour(s))  Resp Panel by  RT-PCR (Flu A&B, Covid) Nasopharyngeal Swab     Status: None   Collection Time: 11/21/20 10:55 AM   Specimen: Nasopharyngeal Swab; Nasopharyngeal(NP) swabs in vial transport medium  Result Value Ref Range Status   SARS Coronavirus 2 by RT PCR NEGATIVE NEGATIVE Final    Comment: (NOTE) SARS-CoV-2 target nucleic acids are NOT DETECTED.  The SARS-CoV-2 RNA is generally detectable in upper respiratory specimens during the acute phase of infection. The lowest concentration of SARS-CoV-2 viral copies this assay can detect is 138 copies/mL. A negative result does not preclude SARS-Cov-2 infection and should not be used as the sole  basis for treatment or other patient management decisions. A negative result may occur with  improper specimen collection/handling, submission of specimen other than nasopharyngeal swab, presence of viral mutation(s) within the areas targeted by this assay, and inadequate number of viral copies(<138 copies/mL). A negative result must be combined with clinical observations, patient history, and epidemiological information. The expected result is Negative.  Fact Sheet for Patients:  EntrepreneurPulse.com.au  Fact Sheet for Healthcare Providers:  IncredibleEmployment.be  This test is no t yet approved or cleared by the [place] FDA and  has been authorized for detection and/or diagnosis of SARS-CoV-2 by FDA under an Emergency Use Authorization (EUA). This EUA will remain  in effect (meaning this test can be used) for the duration of the COVID-19 declaration under Section 564(b)(1) of the Act, 21 U.S.C.section 360bbb-3(b)(1), unless the authorization is terminated  or revoked sooner.       Influenza A by PCR NEGATIVE NEGATIVE Final   Influenza B by PCR NEGATIVE NEGATIVE Final    Comment: (NOTE) The Xpert Xpress SARS-CoV-2/FLU/RSV plus assay is intended as an aid in the diagnosis of influenza from Nasopharyngeal swab  specimens and should not be used as a sole basis for treatment. Nasal washings and aspirates are unacceptable for Xpert Xpress SARS-CoV-2/FLU/RSV testing.  Fact Sheet for Patients: EntrepreneurPulse.com.au  Fact Sheet for Healthcare Providers: IncredibleEmployment.be  This test is not yet approved or cleared by the [place] FDA and has been authorized for detection and/or diagnosis of SARS-CoV-2 by FDA under an Emergency Use Authorization (EUA). This EUA will remain in effect (meaning this test can be used) for the duration of the COVID-19 declaration under Section 564(b)(1) of the Act, 21 U.S.C. section 360bbb-3(b)(1), unless the authorization is terminated or revoked.  Performed at [facility], [place] [address], [place], [place] [address]   Blood Culture (routine x 2)     Status: None   Collection Time: 11/21/20 10:55 AM   Specimen: BLOOD RIGHT ARM  Result Value Ref Range Status   Specimen Description BLOOD RIGHT ARM  Final   Special Requests   Final    BOTTLES DRAWN AEROBIC AND ANAEROBIC Blood Culture adequate volume   Culture   Final    NO GROWTH 5 DAYS Performed at [facility], [place] [address], [place], [place] [address]    Report Status 11/26/2020 FINAL  Final  Urine culture     Status: Abnormal   Collection Time: 11/21/20 10:55 AM   Specimen: In/Out Cath Urine  Result Value Ref Range Status   Specimen Description IN/OUT CATH URINE  Final   Special Requests   Final    NONE Performed at [facility], [place] [address], [place], [place] [address]    Culture (A)  Final    40,000 COLONIES/mL KLEBSIELLA PNEUMONIAE 40,000 COLONIES/mL ENTEROBACTER CLOACAE    Report Status 11/23/2020 FINAL  Final   Organism ID, Bacteria KLEBSIELLA PNEUMONIAE (A)  Final   Organism ID, Bacteria ENTEROBACTER CLOACAE (A)  Final      Susceptibility   Enterobacter cloacae - MIC*    CEFAZOLIN >=64 RESISTANT Resistant     CEFEPIME <=0.12  SENSITIVE Sensitive     CIPROFLOXACIN <=0.25 SENSITIVE Sensitive     GENTAMICIN <=1 SENSITIVE Sensitive     IMIPENEM <=0.25 SENSITIVE Sensitive     NITROFURANTOIN 64 INTERMEDIATE Intermediate     TRIMETH/SULFA <=20 SENSITIVE Sensitive     PIP/TAZO <=4 SENSITIVE Sensitive     * 40,000 COLONIES/mL  ENTEROBACTER CLOACAE   Klebsiella pneumoniae - MIC*    AMPICILLIN >=32 RESISTANT Resistant     CEFAZOLIN <=4 SENSITIVE Sensitive     CEFEPIME <=0.12 SENSITIVE Sensitive     CEFTRIAXONE <=0.25 SENSITIVE Sensitive     CIPROFLOXACIN <=0.25 SENSITIVE Sensitive     GENTAMICIN <=1 SENSITIVE Sensitive     IMIPENEM <=0.25 SENSITIVE Sensitive     NITROFURANTOIN 128 RESISTANT Resistant     TRIMETH/SULFA <=20 SENSITIVE Sensitive     AMPICILLIN/SULBACTAM 8 SENSITIVE Sensitive     PIP/TAZO <=4 SENSITIVE Sensitive     * 40,000 COLONIES/mL KLEBSIELLA PNEUMONIAE  Blood Culture (routine x 2)     Status: None   Collection Time: 11/21/20 11:00 AM   Specimen: BLOOD LEFT FOREARM  Result Value Ref Range Status   Specimen Description BLOOD LEFT FOREARM  Final   Special Requests   Final    BOTTLES DRAWN AEROBIC AND ANAEROBIC Blood Culture adequate volume   Culture   Final    NO GROWTH 5 DAYS Performed at [facility], [address] [place], [place] [address]    Report Status 11/26/2020 FINAL  Final  MRSA PCR Screening     Status: None   Collection Time: 11/22/20 12:37 AM   Specimen: Nasal Mucosa; Nasopharyngeal  Result Value Ref Range Status   MRSA by PCR NEGATIVE NEGATIVE Final    Comment:        The GeneXpert MRSA Assay (FDA approved for NASAL specimens only), is one component of a comprehensive MRSA colonization surveillance program. It is not intended to diagnose MRSA infection nor to guide or monitor treatment for MRSA infections. Performed at [facility], [place] [address], [place], [place] [address]      Labs: BNP (last 3 results) Recent Labs    10/23/20 1426  BNP 27.0    Basic Metabolic Panel: Recent Labs  Lab 11/23/20 0317 11/24/20 0412 11/25/20 0259 11/26/20 0334 11/27/20 0436  NA 141 139 139 137 136  K 4.4 4.1 4.3 3.7 3.5  CL 103 102 99 99 99  CO2 34* 32 35* 34* 33*  GLUCOSE 83 80 92 85 92  BUN 9 8 7* 8 7*  CREATININE 0.83 0.83 0.83 0.75 0.77  CALCIUM 9.1 8.9 9.3 8.8* 8.5*   Liver Function Tests: No results for input(s): AST, ALT, ALKPHOS, BILITOT, PROT, ALBUMIN in the last 168 hours. No results for input(s): LIPASE, AMYLASE in the last 168 hours. No results for input(s): AMMONIA in the last 168 hours. CBC: Recent Labs  Lab 11/25/20 0259 11/26/20 0334 11/27/20 0436 11/28/20 0316 11/29/20 0554  WBC 6.0 5.6 6.7 5.4 5.1  NEUTROABS 3.5 3.3 4.1 3.2 2.8  HGB 8.6* 8.2* 7.9* 8.6* 8.1*  HCT 27.5* 26.0* 24.1* 27.0* 25.6*  MCV 102.6* 102.4* 98.0 101.1* 100.4*  PLT 199 203 222 265 294   Cardiac Enzymes: No results for input(s): CKTOTAL, CKMB, CKMBINDEX, TROPONINI in the last 168 hours. BNP: Invalid input(s): POCBNP CBG: Recent Labs  Lab 11/23/20 0850 11/23/20 1127 11/23/20 1648 11/23/20 2038 11/27/20 1140  GLUCAP 98 91 117* 114* 93   D-Dimer No results for input(s): DDIMER in the last 72 hours. Hgb A1c No results for input(s): HGBA1C in the last 72 hours. Lipid Profile No results for input(s): CHOL, HDL, LDLCALC, TRIG, CHOLHDL, LDLDIRECT in the last 72 hours. Thyroid function studies No results for input(s): TSH, T4TOTAL, T3FREE, THYROIDAB in the last 72 hours.  Invalid input(s): FREET3 Anemia work up No results  for input(s): VITAMINB12, FOLATE, FERRITIN, TIBC, IRON, RETICCTPCT in the last 72 hours. Urinalysis    Component Value Date/Time   COLORURINE YELLOW 11/21/2020 1059   APPEARANCEUR CLEAR 11/21/2020 1059   LABSPEC 1.010 11/21/2020 1059   PHURINE 6.0 11/21/2020 1059   GLUCOSEU NEGATIVE 11/21/2020 1059   [place] 11/21/2020 [place] 11/21/2020 1059   BILIRUBINUR negative 02/18/2020 0906    BILIRUBINUR NEG 06/08/2015 [place] 11/21/2020 1059   PROTEINUR NEGATIVE 11/21/2020 1059   UROBILINOGEN 0.2 02/18/2020 0906   UROBILINOGEN 0.2 10/19/2014 1619   NITRITE NEGATIVE 11/21/2020 1059   LEUKOCYTESUR NEGATIVE 11/21/2020 1059   Sepsis Labs Invalid input(s): PROCALCITONIN,  WBC,  LACTICIDVEN Microbiology Recent Results (from the past 240 hour(s))  Resp Panel by RT-PCR (Flu A&B, Covid) Nasopharyngeal Swab     Status: None   Collection Time: 11/21/20 10:55 AM   Specimen: Nasopharyngeal Swab; Nasopharyngeal(NP) swabs in vial transport medium  Result Value Ref Range Status   SARS Coronavirus 2 by RT PCR NEGATIVE NEGATIVE Final    Comment: (NOTE) SARS-CoV-2 target nucleic acids are NOT DETECTED.  The SARS-CoV-2 RNA is generally detectable in upper respiratory specimens during the acute phase of infection. The lowest concentration of SARS-CoV-2 viral copies this assay can detect is 138 copies/mL. A negative result does not preclude SARS-Cov-2 infection and should not be used as the sole basis for treatment or other patient management decisions. A negative result may occur with  improper specimen collection/handling, submission of specimen other than nasopharyngeal swab, presence of viral mutation(s) within the areas targeted by this assay, and inadequate number of viral copies(<138 copies/mL). A negative result must be combined with clinical observations, patient history, and epidemiological information. The expected result is Negative.  Fact Sheet for Patients:  EntrepreneurPulse.com.au  Fact Sheet for Healthcare Providers:  IncredibleEmployment.be  This test is no t yet approved or cleared by the [place] FDA and  has been authorized for detection and/or diagnosis of SARS-CoV-2 by FDA under an Emergency Use Authorization (EUA). This EUA will remain  in effect (meaning this test can be used) for the duration of  the COVID-19 declaration under Section 564(b)(1) of the Act, 21 U.S.C.section 360bbb-3(b)(1), unless the authorization is terminated  or revoked sooner.       Influenza A by PCR NEGATIVE NEGATIVE Final   Influenza B by PCR NEGATIVE NEGATIVE Final    Comment: (NOTE) The Xpert Xpress SARS-CoV-2/FLU/RSV plus assay is intended as an aid in the diagnosis of influenza from Nasopharyngeal swab specimens and should not be used as a sole basis for treatment. Nasal washings and aspirates are unacceptable for Xpert Xpress SARS-CoV-2/FLU/RSV testing.  Fact Sheet for Patients: EntrepreneurPulse.com.au  Fact Sheet for Healthcare Providers: IncredibleEmployment.be  This test is not yet approved or cleared by the [place] FDA and has been authorized for detection and/or diagnosis of SARS-CoV-2 by FDA under an Emergency Use Authorization (EUA). This EUA will remain in effect (meaning this test can be used) for the duration of the COVID-19 declaration under Section 564(b)(1) of the Act, 21 U.S.C. section 360bbb-3(b)(1), unless the authorization is terminated or revoked.  Performed at [facility], [place] [address], [place], [place] [address]   Blood Culture (routine x 2)     Status: None   Collection Time: 11/21/20 10:55 AM   Specimen: BLOOD RIGHT ARM  Result Value Ref Range Status   Specimen Description BLOOD RIGHT ARM  Final   Special Requests  Final    BOTTLES DRAWN AEROBIC AND ANAEROBIC Blood Culture adequate volume   Culture   Final    NO GROWTH 5 DAYS Performed at [facility], [place] [address], [place], [place] [address]    Report Status 11/26/2020 FINAL  Final  Urine culture     Status: Abnormal   Collection Time: 11/21/20 10:55 AM   Specimen: In/Out Cath Urine  Result Value Ref Range Status   Specimen Description IN/OUT CATH URINE  Final   Special Requests   Final    NONE Performed at [facility], [place] [address]  [place], [place] [address]    Culture (A)  Final    40,000 COLONIES/mL KLEBSIELLA PNEUMONIAE 40,000 COLONIES/mL ENTEROBACTER CLOACAE    Report Status 11/23/2020 FINAL  Final   Organism ID, Bacteria KLEBSIELLA PNEUMONIAE (A)  Final   Organism ID, Bacteria ENTEROBACTER CLOACAE (A)  Final      Susceptibility   Enterobacter cloacae - MIC*    CEFAZOLIN >=64 RESISTANT Resistant     CEFEPIME <=0.12 SENSITIVE Sensitive     CIPROFLOXACIN <=0.25 SENSITIVE Sensitive     GENTAMICIN <=1 SENSITIVE Sensitive     IMIPENEM <=0.25 SENSITIVE Sensitive     NITROFURANTOIN 64 INTERMEDIATE Intermediate     TRIMETH/SULFA <=20 SENSITIVE Sensitive     PIP/TAZO <=4 SENSITIVE Sensitive     * 40,000 COLONIES/mL ENTEROBACTER CLOACAE   Klebsiella pneumoniae - MIC*    AMPICILLIN >=32 RESISTANT Resistant     CEFAZOLIN <=4 SENSITIVE Sensitive     CEFEPIME <=0.12 SENSITIVE Sensitive     CEFTRIAXONE <=0.25 SENSITIVE Sensitive     CIPROFLOXACIN <=0.25 SENSITIVE Sensitive     GENTAMICIN <=1 SENSITIVE Sensitive     IMIPENEM <=0.25 SENSITIVE Sensitive     NITROFURANTOIN 128 RESISTANT Resistant     TRIMETH/SULFA <=20 SENSITIVE Sensitive     AMPICILLIN/SULBACTAM 8 SENSITIVE Sensitive     PIP/TAZO <=4 SENSITIVE Sensitive     * 40,000 COLONIES/mL KLEBSIELLA PNEUMONIAE  Blood Culture (routine x 2)     Status: None   Collection Time: 11/21/20 11:00 AM   Specimen: BLOOD LEFT FOREARM  Result Value Ref Range Status   Specimen Description BLOOD LEFT FOREARM  Final   Special Requests   Final    BOTTLES DRAWN AEROBIC AND ANAEROBIC Blood Culture adequate volume   Culture   Final    NO GROWTH 5 DAYS Performed at [facility], [address], [place], [place] [address]    Report Status 11/26/2020 FINAL  Final  MRSA PCR Screening     Status: None   Collection Time: 11/22/20 12:37 AM   Specimen: Nasal Mucosa; Nasopharyngeal  Result Value Ref Range Status   MRSA by PCR NEGATIVE NEGATIVE Final    Comment:        The  GeneXpert MRSA Assay (FDA approved for NASAL specimens only), is one component of a comprehensive MRSA colonization surveillance program. It is not intended to diagnose MRSA infection nor to guide or monitor treatment for MRSA infections. Performed at [facility], [place] [address], [place], [place] [address]      Time coordinating discharge: Over 30 minutes  SIGNED:   Charlynne Cousins, MD  [facility] 11/29/2020, 10:03 AM Pager   If 7PM-7AM, please contact night-coverage [email or web address] Password [email or web address]

## 2020-12-01 DIAGNOSIS — I87311 Chronic venous hypertension (idiopathic) with ulcer of right lower extremity: Secondary | ICD-10-CM | POA: Diagnosis not present

## 2020-12-01 DIAGNOSIS — I739 Peripheral vascular disease, unspecified: Secondary | ICD-10-CM | POA: Diagnosis not present

## 2020-12-01 DIAGNOSIS — I87312 Chronic venous hypertension (idiopathic) with ulcer of left lower extremity: Secondary | ICD-10-CM | POA: Diagnosis not present

## 2020-12-01 DIAGNOSIS — I87319 Chronic venous hypertension (idiopathic) with ulcer of unspecified lower extremity: Secondary | ICD-10-CM | POA: Diagnosis not present

## 2020-12-07 DIAGNOSIS — I83019 Varicose veins of right lower extremity with ulcer of unspecified site: Secondary | ICD-10-CM | POA: Diagnosis not present

## 2020-12-07 DIAGNOSIS — M6281 Muscle weakness (generalized): Secondary | ICD-10-CM | POA: Diagnosis not present

## 2020-12-07 DIAGNOSIS — A4902 Methicillin resistant Staphylococcus aureus infection, unspecified site: Secondary | ICD-10-CM | POA: Diagnosis not present

## 2020-12-07 DIAGNOSIS — I83029 Varicose veins of left lower extremity with ulcer of unspecified site: Secondary | ICD-10-CM | POA: Diagnosis not present

## 2020-12-08 DIAGNOSIS — M25569 Pain in unspecified knee: Secondary | ICD-10-CM | POA: Diagnosis not present

## 2020-12-08 DIAGNOSIS — I739 Peripheral vascular disease, unspecified: Secondary | ICD-10-CM | POA: Diagnosis not present

## 2020-12-08 DIAGNOSIS — R262 Difficulty in walking, not elsewhere classified: Secondary | ICD-10-CM | POA: Diagnosis not present

## 2020-12-08 DIAGNOSIS — M6281 Muscle weakness (generalized): Secondary | ICD-10-CM | POA: Diagnosis not present

## 2020-12-08 DIAGNOSIS — R627 Adult failure to thrive: Secondary | ICD-10-CM | POA: Diagnosis not present

## 2020-12-08 DIAGNOSIS — I87319 Chronic venous hypertension (idiopathic) with ulcer of unspecified lower extremity: Secondary | ICD-10-CM | POA: Diagnosis not present

## 2020-12-08 DIAGNOSIS — R5381 Other malaise: Secondary | ICD-10-CM | POA: Diagnosis not present

## 2020-12-13 ENCOUNTER — Encounter: Payer: Self-pay | Admitting: Internal Medicine

## 2020-12-13 ENCOUNTER — Ambulatory Visit (INDEPENDENT_AMBULATORY_CARE_PROVIDER_SITE_OTHER): Payer: PPO | Admitting: Internal Medicine

## 2020-12-13 ENCOUNTER — Other Ambulatory Visit: Payer: Self-pay

## 2020-12-13 VITALS — BP 112/68 | HR 78 | Ht 59.5 in | Wt 148.0 lb

## 2020-12-13 DIAGNOSIS — M25512 Pain in left shoulder: Secondary | ICD-10-CM | POA: Insufficient documentation

## 2020-12-13 DIAGNOSIS — M353 Polymyalgia rheumatica: Secondary | ICD-10-CM | POA: Diagnosis not present

## 2020-12-13 HISTORY — DX: Pain in left shoulder: M25.512

## 2020-12-13 NOTE — Progress Notes (Signed)
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 PM    Final    VAS [place] UPPER EXTREMITY VENOUS DUPLEX  Result Date: 11/26/2020 UPPER VENOUS STUDY  Patient Name:  KINBERLY PERRIS Avis  Date of Exam:   11/26/2020 Medical Rec #: [id number]    Accession #:    [id number] Date of Birth: 1940/11/30    Patient Gender: F Patient Age:   79Y Exam Location:  [facility] Procedure:  VAS [place] UPPER EXTREMITY VENOUS DUPLEX Referring Phys: [id number] ABRAHAM FELIZ ORTIZ --------------------------------------------------------------------------------  Indications: Pain Risk Factors: None identified. Limitations: Poor ultrasound/tissue interface. Comparison Study: No prior studies. Performing Technologist: Oliver Hum RVT  Examination Guidelines: A complete evaluation includes B-mode imaging, spectral Doppler, color Doppler, and power Doppler as needed of all accessible portions of each vessel. Bilateral testing is considered an integral part of a complete examination. Limited examinations for reoccurring indications may be performed as noted.  Right Findings: +----------+------------+---------+-----------+----------+-------+ RIGHT     CompressiblePhasicitySpontaneousPropertiesSummary  +----------+------------+---------+-----------+----------+-------+ Subclavian    Full       Yes       Yes                      +----------+------------+---------+-----------+----------+-------+  Left Findings: +----------+------------+---------+-----------+----------+-------+ LEFT      CompressiblePhasicitySpontaneousPropertiesSummary +----------+------------+---------+-----------+----------+-------+ IJV           Full       Yes       Yes                      +----------+------------+---------+-----------+----------+-------+ Subclavian    Full       Yes       Yes                      +----------+------------+---------+-----------+----------+-------+ Axillary      Full       Yes       Yes                      +----------+------------+---------+-----------+----------+-------+ Brachial      Full       Yes       Yes                      +----------+------------+---------+-----------+----------+-------+ Radial        Full                                          +----------+------------+---------+-----------+----------+-------+ Ulnar         Full                                          +----------+------------+---------+-----------+----------+-------+ Cephalic      Full                                          +----------+------------+---------+-----------+----------+-------+ Basilic       Full                                          +----------+------------+---------+-----------+----------+-------+  Summary:  Right: No evidence of thrombosis in the subclavian.  Left: No evidence of deep vein thrombosis in the upper extremity. No evidence of superficial vein thrombosis in the upper extremity.  *See table(s) above for measurements and observations.  Diagnosing physician: Deitra Mayo MD Electronically signed by Deitra Mayo MD on 11/26/2020 at 8:18:20 PM.    Final      Recent Labs: Lab Results  Component Value Date   WBC  5.1  11/29/2020   HGB 8.1 (L) 11/29/2020   PLT 294 11/29/2020   NA 136 11/27/2020   K 3.5 11/27/2020   CL 99 11/27/2020   CO2 33 (H) 11/27/2020   GLUCOSE 92 11/27/2020   BUN 7 (L) 11/27/2020   CREATININE 0.77 11/27/2020   BILITOT 0.4 11/21/2020   ALKPHOS 66 11/21/2020   AST 11 (L) 11/21/2020   ALT 6 11/21/2020   PROT 5.9 (L) 11/21/2020   ALBUMIN 2.3 (L) 11/21/2020   CALCIUM 8.5 (L) 11/27/2020   GFRAA >60 09/08/2019    Speciality Comments: No specialty comments available.  Procedures:  No procedures performed Allergies: Statins, Gabapentin, and Statins   Assessment / Plan:     Visit Diagnoses: Polymyalgia rheumatica ([place]) - Plan: Sedimentation rate, C-reactive protein, Rheumatoid factor  History of PMR current symptoms are somewhat more of generalized joint pain not entirely limited to proximal distribution.  This could be more degenerative arthritis related.  We will also check rheumatoid factor since inflammatory arthritis can develop from PMR.  Checking sedimentation rate and CRP for evidence of increasing inflammation activity otherwise would not recommend restarting maintenance glucocorticoids at this time unless symptoms worsen.  Acute pain of left shoulder  Left shoulder pain is significantly greater than right side and her generalized symptoms so I suspect there could be contribution of mechanical problem more minor injury versus localized disease activity.  She is not interested in intra-articular steroid shot or other intervention at this time as symptoms are improving somewhat on their own.  Orders: Orders Placed This Encounter  Procedures   Sedimentation rate   C-reactive protein   Rheumatoid factor   No orders of the defined types were placed in this encounter.    Follow-Up Instructions: Return if symptoms worsen or fail to improve.   Collier Salina, MD  Note - This record has been created using [employer].  Chart creation errors have been sought,  but may not always  have been located. Such creation errors do not reflect on  the standard of medical care.

## 2020-12-14 ENCOUNTER — Ambulatory Visit (INDEPENDENT_AMBULATORY_CARE_PROVIDER_SITE_OTHER): Payer: PPO | Admitting: Infectious Diseases

## 2020-12-14 ENCOUNTER — Telehealth: Payer: Self-pay

## 2020-12-14 VITALS — BP 136/84 | HR 105 | Temp 98.5°F

## 2020-12-14 DIAGNOSIS — Z5181 Encounter for therapeutic drug level monitoring: Secondary | ICD-10-CM

## 2020-12-14 DIAGNOSIS — I872 Venous insufficiency (chronic) (peripheral): Secondary | ICD-10-CM

## 2020-12-14 DIAGNOSIS — R7881 Bacteremia: Secondary | ICD-10-CM

## 2020-12-14 DIAGNOSIS — B9562 Methicillin resistant Staphylococcus aureus infection as the cause of diseases classified elsewhere: Secondary | ICD-10-CM | POA: Diagnosis not present

## 2020-12-14 DIAGNOSIS — I76 Septic arterial embolism: Secondary | ICD-10-CM | POA: Diagnosis not present

## 2020-12-14 LAB — C-REACTIVE PROTEIN: CRP: 18 mg/L — ABNORMAL HIGH (ref <8.0)

## 2020-12-14 LAB — SEDIMENTATION RATE: Sed Rate: 38 mm/h — ABNORMAL HIGH (ref 0–30)

## 2020-12-14 LAB — RHEUMATOID FACTOR: Rheumatoid fact SerPl-aCnc: <14 IU/mL (ref <14)

## 2020-12-14 NOTE — Progress Notes (Addendum)
[facility]                                                             [place], [place], [place], [address]                                                                  Phn. [phone]; Fax: [phone]                                                                             Date: 12/14/20  Reason for Referral: HFU for MRSA bacteremia  Assessment Complicated MRSA Bacteremia Possible Rt sided Endocarditis given Pulmonary septic emboli and MRSA bacteremia Medication Monitoring encounter Venous stasis of bilateral Lower extremities  Problem List Items Addressed This Visit      Cardiovascular and Mediastinum   Septic embolism ([place])     Musculoskeletal and Integument   Venous stasis dermatitis of both lower extremities     Other   MRSA bacteremia - Primary   Medication monitoring encounter      Plan Patient has already completed 6 weeks of IV Vancomycin in 6/3. No complaints today Will request labs from [facility]  Fu as needed  Fu with wound care for venous stasis/swelling of lower extremity   All questions and concerns were discussed and addressed. Patient verbalized understanding of the plan. ____________________________________________________________________________________________________________________  HPI: 80 Y O female with PMH of HTN, PVD, PMR, congenital solitary kidney with a recent hospital admission in 08/2295 for complicated MRSA bacteremia on daptomycin  who is here for HFU after another  Hospital admission in 11/2020 for MRSA bacteremia, presumed TV endocarditis in the setting of pulmonary septic emboli. Blood cx 5/15 no growth. TTE and TEE negative for vegetations. CT chest concerning for septic pulmonary emboli. MRI L spine negative for discitis and OM.  She was discharged on 5/23 to complete 6 weeks of tx for possible Rt sided endocarditis. End date  12/10/20  She is accompanied with her granddaughter.  She states she completed 6 weeks of IV vancomycin on 6/3.  Denies having any issues with the PICC line in her right arm.  Denies fever chills and sweats.  Denies nausea vomiting abdominal pain diarrhea and rashes.  Denies shortness of breath, cough and chest pain. she is sitting in a wheelchair and states that she is able to walk but with support.  She follows with wound care for lower extremity swelling.  Denies any pain tenderness and redness in the lower extremities.  ROS: Constitutional: Negative for fever, chills, activity change, appetite change, fatigue and unexpected weight change.  HENT: Negative for congestion, sore throat, rhinorrhea, sneezing, trouble swallowing and sinus pressure.  Eyes: Negative for photophobia and visual disturbance.  Respiratory: Negative for cough,  chest tightness, shortness of breath, wheezing and stridor.  Cardiovascular: Negative for chest pain, palpitations and leg swelling.  Gastrointestinal: Negative for nausea, vomiting, abdominal pain, diarrhea, constipation, blood in stool, abdominal distention and anal bleeding.  Genitourinary: Negative for dysuria, hematuria, flank pain and difficulty urinating.  Musculoskeletal: Negative for myalgias, back pain, joint swelling, arthralgias and gait problem.  Skin: Negative for color change, pallor, rash and wound.  Neurological: Negative for dizziness, tremors, weakness and light-headedness.  Hematological: Negative for adenopathy. Does not bruise/bleed easily.  Psychiatric/Behavioral: Negative for behavioral problems, confusion, sleep disturbance, dysphoric mood, decreased concentration and agitation.   Past Medical History:  Diagnosis Date  . Anxiety   . Arthritis    RA  . Carpal tunnel syndrome, bilateral   . Chronic kidney disease    CKD stage III, absence of left kidney  . CKD (chronic kidney disease) stage 3, GFR 30-59 ml/min (HCC) 09/06/2019  . Congenital  absence of one kidney    Pt has right kidney only  . Depression   . Dyslipidemia 09/06/2019  . Essential hypertension 09/06/2019  . GERD (gastroesophageal reflux disease)   . Headache   . Heart murmur   . Hypercholesteremia   . Hypertension   . Peripheral vascular disease ([place])    a. s/p R external iliac stent '06 with angioplasty '13 (Dr. Benjamine Sprague) b. 01/2016: s/p PTA/stenting in L common iliac artery (Dr. Gwenlyn Found)  . Polymyalgia rheumatica ([place])   . Psoriasis   . Sleep apnea   . Tremor 09/06/2019     Vitals   Examination  General - not in acute distress, comfortably sitting in chair HEENT - PEERLA, no pallor and no icterus Chest - b/l clear air entry, no additional sounds CVS- Normal s1s2, RRR Abdomen - Soft, Non tender , non distended Ext- no pedal edema Neuro: grossly normal Back - WNL Psych : calm and cooperative   Recent labs   Pertinent Microbiology    Pertinent Imaging TTE 11/22/20 1. Current study is inadequate to evaluate for endocarditis. Recomm TEE if clinically indicated. 2. Left ventricular ejection fraction, by estimation, is 70 to 75%. The left ventricle has hyperdynamic function. The left ventricle has no regional wall motion abnormalities. Indeterminate diastolic filling due to E-A fusion. 3. Right ventricular systolic function is low normal. The right ventricular size is normal. 4. The mitral valve is normal in structure. Mild mitral valve regurgitation. 5. The aortic valve was not well visualized. Aortic valve regurgitation is not visualized. Mild to moderate aortic valve sclerosis/calcification is present, without any evidence of aortic stenosis. 6. The inferior vena cava is normal in size with greater than 50% respiratory variability, suggesting right atrial pressure of 3 mmHg. Comparison(s): The left ventricular function is unchanged.  TEE 11/25/20 1. Left ventricular ejection fraction, by estimation, is 60 to 65%. The left ventricle has  normal function. The left ventricle has no regional wall motion abnormalities. 2. Right ventricular systolic function is normal. The right ventricular size is normal. 3. No left atrial/left atrial appendage thrombus was detected. 4. The mitral valve is degenerative. There is a very small density on the A2 segment of the anterior MV leaflet by 3D imaging that likely represents calcified degeneration of the mitral valve leaflets. No obvious shaggy vegetations noted. Mild mitral valve regurgitation. No evidence of mitral stenosis. 5. The aortic valve is normal in structure. Aortic valve regurgitation is not visualized. No aortic stenosis is present. 6. There is Moderate (Grade III) layered plaque involving the transverse aorta,  ascending aorta and descending aorta. 7. The inferior vena cava is normal in size with greater than 50% respiratory variability, suggesting right atrial pressure of 3 mmHg. Conclusion(s)/Recommendation(s): Normal biventricular function without evidence of hemodynamically significant valvular heart disease.  CT chest 11/21/20 IMPRESSION: 1. New peripheral airspace opacities throughout both lungs with an upper lobe predominance and new scattered BILATERAL lung nodule since the prior CT 1 month ago. Some of the peripheral opacities demonstrate central necrosis. The distribution and the time course suggests septic emboli may be the etiology. 2. Stable scar/atelectasis deep in the lower lobes bilaterally. 3. 2.0 cm LEFT lobe thyroid nodule which has been stable dating back to at 36. Stability for greater than 5 years implies benignity; no biopsy or followup indicated. (Ref: J Am Coll Radiol. 2015 Feb;12(2): 143-50).  Aortic Atherosclerosis (ICD10-I70.0).   All pertinent labs/Imagings/notes reviewed. All pertinent plain films and CT images have been personally visualized and interpreted; radiology reports have been reviewed. Decision making incorporated into the  Impression / Recommendations.  I have spent 60 minutes for this patient encounter including  review of prior medical records with greater than 50% of time in face to face counsel of the patient/discussing diagnostics and plan of care.   Electronically signed by:  Rosiland Oz, MD Infectious Disease Physician [facility] [address] [place], [place] [address] Phone: [phone]  Fax: [phone]

## 2020-12-14 NOTE — Telephone Encounter (Signed)
error 

## 2020-12-14 NOTE — Telephone Encounter (Signed)
Called and spoke with the Nurse at [facility]. Charlene to give verbal order to D/C PICC line per Dr. West Bali. Sent written back with patient. Copy of order scan in pt chart.

## 2020-12-19 NOTE — Progress Notes (Signed)
Lab test shows inflammatory markers are continuing to decrease compared to a month ago without any specific medication for this. Based on this and her clinically feeling partially better I do not recommend restarting any steroids or other medicines for PMR currently. If she does start to notice worsening particularly shoulder or hip pain and weakness, we can take another look at that time.

## 2020-12-21 DIAGNOSIS — S41102A Unspecified open wound of left upper arm, initial encounter: Secondary | ICD-10-CM | POA: Diagnosis not present

## 2020-12-21 DIAGNOSIS — Z9981 Dependence on supplemental oxygen: Secondary | ICD-10-CM | POA: Diagnosis not present

## 2020-12-21 DIAGNOSIS — R0902 Hypoxemia: Secondary | ICD-10-CM | POA: Diagnosis not present

## 2020-12-22 DIAGNOSIS — R059 Cough, unspecified: Secondary | ICD-10-CM | POA: Diagnosis not present

## 2020-12-22 DIAGNOSIS — R0989 Other specified symptoms and signs involving the circulatory and respiratory systems: Secondary | ICD-10-CM | POA: Diagnosis not present

## 2020-12-23 DIAGNOSIS — U071 COVID-19: Secondary | ICD-10-CM | POA: Diagnosis not present

## 2020-12-23 DIAGNOSIS — R0989 Other specified symptoms and signs involving the circulatory and respiratory systems: Secondary | ICD-10-CM | POA: Diagnosis not present

## 2020-12-23 DIAGNOSIS — R051 Acute cough: Secondary | ICD-10-CM | POA: Diagnosis not present

## 2020-12-24 DIAGNOSIS — F411 Generalized anxiety disorder: Secondary | ICD-10-CM | POA: Diagnosis not present

## 2020-12-24 DIAGNOSIS — U071 COVID-19: Secondary | ICD-10-CM | POA: Diagnosis not present

## 2020-12-24 DIAGNOSIS — R051 Acute cough: Secondary | ICD-10-CM | POA: Diagnosis not present

## 2020-12-24 DIAGNOSIS — R0989 Other specified symptoms and signs involving the circulatory and respiratory systems: Secondary | ICD-10-CM | POA: Diagnosis not present

## 2020-12-31 DIAGNOSIS — R197 Diarrhea, unspecified: Secondary | ICD-10-CM | POA: Diagnosis not present

## 2020-12-31 DIAGNOSIS — R0989 Other specified symptoms and signs involving the circulatory and respiratory systems: Secondary | ICD-10-CM | POA: Diagnosis not present

## 2020-12-31 DIAGNOSIS — U071 COVID-19: Secondary | ICD-10-CM | POA: Diagnosis not present

## 2020-12-31 DIAGNOSIS — R051 Acute cough: Secondary | ICD-10-CM | POA: Diagnosis not present

## 2021-01-04 DIAGNOSIS — E7800 Pure hypercholesterolemia, unspecified: Secondary | ICD-10-CM | POA: Diagnosis not present

## 2021-01-04 DIAGNOSIS — K219 Gastro-esophageal reflux disease without esophagitis: Secondary | ICD-10-CM | POA: Diagnosis not present

## 2021-01-04 DIAGNOSIS — Z8701 Personal history of pneumonia (recurrent): Secondary | ICD-10-CM | POA: Diagnosis not present

## 2021-01-04 DIAGNOSIS — I872 Venous insufficiency (chronic) (peripheral): Secondary | ICD-10-CM | POA: Diagnosis not present

## 2021-01-04 DIAGNOSIS — Q600 Renal agenesis, unilateral: Secondary | ICD-10-CM | POA: Diagnosis not present

## 2021-01-04 DIAGNOSIS — M4722 Other spondylosis with radiculopathy, cervical region: Secondary | ICD-10-CM | POA: Diagnosis not present

## 2021-01-04 DIAGNOSIS — N1832 Chronic kidney disease, stage 3b: Secondary | ICD-10-CM | POA: Diagnosis not present

## 2021-01-04 DIAGNOSIS — M353 Polymyalgia rheumatica: Secondary | ICD-10-CM | POA: Diagnosis not present

## 2021-01-04 DIAGNOSIS — Z9181 History of falling: Secondary | ICD-10-CM | POA: Diagnosis not present

## 2021-01-04 DIAGNOSIS — Z6830 Body mass index (BMI) 30.0-30.9, adult: Secondary | ICD-10-CM | POA: Diagnosis not present

## 2021-01-04 DIAGNOSIS — F332 Major depressive disorder, recurrent severe without psychotic features: Secondary | ICD-10-CM | POA: Diagnosis not present

## 2021-01-04 DIAGNOSIS — I739 Peripheral vascular disease, unspecified: Secondary | ICD-10-CM | POA: Diagnosis not present

## 2021-01-04 DIAGNOSIS — F411 Generalized anxiety disorder: Secondary | ICD-10-CM | POA: Diagnosis not present

## 2021-01-04 DIAGNOSIS — D539 Nutritional anemia, unspecified: Secondary | ICD-10-CM | POA: Diagnosis not present

## 2021-01-04 DIAGNOSIS — E669 Obesity, unspecified: Secondary | ICD-10-CM | POA: Diagnosis not present

## 2021-01-04 DIAGNOSIS — H401330 Pigmentary glaucoma, bilateral, stage unspecified: Secondary | ICD-10-CM | POA: Diagnosis not present

## 2021-01-04 DIAGNOSIS — Z79891 Long term (current) use of opiate analgesic: Secondary | ICD-10-CM | POA: Diagnosis not present

## 2021-01-04 DIAGNOSIS — R627 Adult failure to thrive: Secondary | ICD-10-CM | POA: Diagnosis not present

## 2021-01-04 DIAGNOSIS — G4733 Obstructive sleep apnea (adult) (pediatric): Secondary | ICD-10-CM | POA: Diagnosis not present

## 2021-01-04 DIAGNOSIS — I129 Hypertensive chronic kidney disease with stage 1 through stage 4 chronic kidney disease, or unspecified chronic kidney disease: Secondary | ICD-10-CM | POA: Diagnosis not present

## 2021-01-04 DIAGNOSIS — Z7901 Long term (current) use of anticoagulants: Secondary | ICD-10-CM | POA: Diagnosis not present

## 2021-01-07 DIAGNOSIS — G4733 Obstructive sleep apnea (adult) (pediatric): Secondary | ICD-10-CM | POA: Diagnosis not present

## 2021-01-07 DIAGNOSIS — K219 Gastro-esophageal reflux disease without esophagitis: Secondary | ICD-10-CM | POA: Diagnosis not present

## 2021-01-07 DIAGNOSIS — M353 Polymyalgia rheumatica: Secondary | ICD-10-CM | POA: Diagnosis not present

## 2021-01-07 DIAGNOSIS — Q600 Renal agenesis, unilateral: Secondary | ICD-10-CM | POA: Diagnosis not present

## 2021-01-07 DIAGNOSIS — R627 Adult failure to thrive: Secondary | ICD-10-CM | POA: Diagnosis not present

## 2021-01-07 DIAGNOSIS — Z8701 Personal history of pneumonia (recurrent): Secondary | ICD-10-CM | POA: Diagnosis not present

## 2021-01-07 DIAGNOSIS — D539 Nutritional anemia, unspecified: Secondary | ICD-10-CM | POA: Diagnosis not present

## 2021-01-07 DIAGNOSIS — F411 Generalized anxiety disorder: Secondary | ICD-10-CM | POA: Diagnosis not present

## 2021-01-07 DIAGNOSIS — Z6830 Body mass index (BMI) 30.0-30.9, adult: Secondary | ICD-10-CM | POA: Diagnosis not present

## 2021-01-07 DIAGNOSIS — I129 Hypertensive chronic kidney disease with stage 1 through stage 4 chronic kidney disease, or unspecified chronic kidney disease: Secondary | ICD-10-CM | POA: Diagnosis not present

## 2021-01-07 DIAGNOSIS — F332 Major depressive disorder, recurrent severe without psychotic features: Secondary | ICD-10-CM | POA: Diagnosis not present

## 2021-01-07 DIAGNOSIS — E7800 Pure hypercholesterolemia, unspecified: Secondary | ICD-10-CM | POA: Diagnosis not present

## 2021-01-07 DIAGNOSIS — E669 Obesity, unspecified: Secondary | ICD-10-CM | POA: Diagnosis not present

## 2021-01-07 DIAGNOSIS — I872 Venous insufficiency (chronic) (peripheral): Secondary | ICD-10-CM | POA: Diagnosis not present

## 2021-01-07 DIAGNOSIS — I739 Peripheral vascular disease, unspecified: Secondary | ICD-10-CM | POA: Diagnosis not present

## 2021-01-07 DIAGNOSIS — H401330 Pigmentary glaucoma, bilateral, stage unspecified: Secondary | ICD-10-CM | POA: Diagnosis not present

## 2021-01-07 DIAGNOSIS — N1832 Chronic kidney disease, stage 3b: Secondary | ICD-10-CM | POA: Diagnosis not present

## 2021-01-07 DIAGNOSIS — Z79891 Long term (current) use of opiate analgesic: Secondary | ICD-10-CM | POA: Diagnosis not present

## 2021-01-07 DIAGNOSIS — M4722 Other spondylosis with radiculopathy, cervical region: Secondary | ICD-10-CM | POA: Diagnosis not present

## 2021-01-07 DIAGNOSIS — Z7901 Long term (current) use of anticoagulants: Secondary | ICD-10-CM | POA: Diagnosis not present

## 2021-01-07 DIAGNOSIS — Z9181 History of falling: Secondary | ICD-10-CM | POA: Diagnosis not present

## 2021-01-11 DIAGNOSIS — Z7901 Long term (current) use of anticoagulants: Secondary | ICD-10-CM | POA: Diagnosis not present

## 2021-01-11 DIAGNOSIS — Z8701 Personal history of pneumonia (recurrent): Secondary | ICD-10-CM | POA: Diagnosis not present

## 2021-01-11 DIAGNOSIS — Z79891 Long term (current) use of opiate analgesic: Secondary | ICD-10-CM | POA: Diagnosis not present

## 2021-01-11 DIAGNOSIS — H401330 Pigmentary glaucoma, bilateral, stage unspecified: Secondary | ICD-10-CM | POA: Diagnosis not present

## 2021-01-11 DIAGNOSIS — G4733 Obstructive sleep apnea (adult) (pediatric): Secondary | ICD-10-CM | POA: Diagnosis not present

## 2021-01-11 DIAGNOSIS — F411 Generalized anxiety disorder: Secondary | ICD-10-CM | POA: Diagnosis not present

## 2021-01-11 DIAGNOSIS — M4722 Other spondylosis with radiculopathy, cervical region: Secondary | ICD-10-CM | POA: Diagnosis not present

## 2021-01-11 DIAGNOSIS — F332 Major depressive disorder, recurrent severe without psychotic features: Secondary | ICD-10-CM | POA: Diagnosis not present

## 2021-01-11 DIAGNOSIS — D539 Nutritional anemia, unspecified: Secondary | ICD-10-CM | POA: Diagnosis not present

## 2021-01-11 DIAGNOSIS — Z9181 History of falling: Secondary | ICD-10-CM | POA: Diagnosis not present

## 2021-01-11 DIAGNOSIS — I129 Hypertensive chronic kidney disease with stage 1 through stage 4 chronic kidney disease, or unspecified chronic kidney disease: Secondary | ICD-10-CM | POA: Diagnosis not present

## 2021-01-11 DIAGNOSIS — Q600 Renal agenesis, unilateral: Secondary | ICD-10-CM | POA: Diagnosis not present

## 2021-01-11 DIAGNOSIS — I739 Peripheral vascular disease, unspecified: Secondary | ICD-10-CM | POA: Diagnosis not present

## 2021-01-11 DIAGNOSIS — E669 Obesity, unspecified: Secondary | ICD-10-CM | POA: Diagnosis not present

## 2021-01-11 DIAGNOSIS — M353 Polymyalgia rheumatica: Secondary | ICD-10-CM | POA: Diagnosis not present

## 2021-01-11 DIAGNOSIS — Z6830 Body mass index (BMI) 30.0-30.9, adult: Secondary | ICD-10-CM | POA: Diagnosis not present

## 2021-01-11 DIAGNOSIS — N1832 Chronic kidney disease, stage 3b: Secondary | ICD-10-CM | POA: Diagnosis not present

## 2021-01-11 DIAGNOSIS — R627 Adult failure to thrive: Secondary | ICD-10-CM | POA: Diagnosis not present

## 2021-01-11 DIAGNOSIS — K219 Gastro-esophageal reflux disease without esophagitis: Secondary | ICD-10-CM | POA: Diagnosis not present

## 2021-01-11 DIAGNOSIS — I872 Venous insufficiency (chronic) (peripheral): Secondary | ICD-10-CM | POA: Diagnosis not present

## 2021-01-11 DIAGNOSIS — E7800 Pure hypercholesterolemia, unspecified: Secondary | ICD-10-CM | POA: Diagnosis not present

## 2021-01-18 DIAGNOSIS — M353 Polymyalgia rheumatica: Secondary | ICD-10-CM | POA: Diagnosis not present

## 2021-01-18 DIAGNOSIS — G4733 Obstructive sleep apnea (adult) (pediatric): Secondary | ICD-10-CM | POA: Diagnosis not present

## 2021-01-18 DIAGNOSIS — I739 Peripheral vascular disease, unspecified: Secondary | ICD-10-CM | POA: Diagnosis not present

## 2021-01-18 DIAGNOSIS — Z79891 Long term (current) use of opiate analgesic: Secondary | ICD-10-CM | POA: Diagnosis not present

## 2021-01-18 DIAGNOSIS — D539 Nutritional anemia, unspecified: Secondary | ICD-10-CM | POA: Diagnosis not present

## 2021-01-18 DIAGNOSIS — E7800 Pure hypercholesterolemia, unspecified: Secondary | ICD-10-CM | POA: Diagnosis not present

## 2021-01-18 DIAGNOSIS — K219 Gastro-esophageal reflux disease without esophagitis: Secondary | ICD-10-CM | POA: Diagnosis not present

## 2021-01-18 DIAGNOSIS — F411 Generalized anxiety disorder: Secondary | ICD-10-CM | POA: Diagnosis not present

## 2021-01-18 DIAGNOSIS — F332 Major depressive disorder, recurrent severe without psychotic features: Secondary | ICD-10-CM | POA: Diagnosis not present

## 2021-01-18 DIAGNOSIS — Q600 Renal agenesis, unilateral: Secondary | ICD-10-CM | POA: Diagnosis not present

## 2021-01-18 DIAGNOSIS — R627 Adult failure to thrive: Secondary | ICD-10-CM | POA: Diagnosis not present

## 2021-01-18 DIAGNOSIS — Z9181 History of falling: Secondary | ICD-10-CM | POA: Diagnosis not present

## 2021-01-18 DIAGNOSIS — I129 Hypertensive chronic kidney disease with stage 1 through stage 4 chronic kidney disease, or unspecified chronic kidney disease: Secondary | ICD-10-CM | POA: Diagnosis not present

## 2021-01-18 DIAGNOSIS — I872 Venous insufficiency (chronic) (peripheral): Secondary | ICD-10-CM | POA: Diagnosis not present

## 2021-01-18 DIAGNOSIS — Z6830 Body mass index (BMI) 30.0-30.9, adult: Secondary | ICD-10-CM | POA: Diagnosis not present

## 2021-01-18 DIAGNOSIS — M4722 Other spondylosis with radiculopathy, cervical region: Secondary | ICD-10-CM | POA: Diagnosis not present

## 2021-01-18 DIAGNOSIS — Z7901 Long term (current) use of anticoagulants: Secondary | ICD-10-CM | POA: Diagnosis not present

## 2021-01-18 DIAGNOSIS — Z8701 Personal history of pneumonia (recurrent): Secondary | ICD-10-CM | POA: Diagnosis not present

## 2021-01-18 DIAGNOSIS — N1832 Chronic kidney disease, stage 3b: Secondary | ICD-10-CM | POA: Diagnosis not present

## 2021-01-18 DIAGNOSIS — E669 Obesity, unspecified: Secondary | ICD-10-CM | POA: Diagnosis not present

## 2021-01-18 DIAGNOSIS — H401330 Pigmentary glaucoma, bilateral, stage unspecified: Secondary | ICD-10-CM | POA: Diagnosis not present

## 2021-01-20 DIAGNOSIS — E669 Obesity, unspecified: Secondary | ICD-10-CM | POA: Diagnosis not present

## 2021-01-20 DIAGNOSIS — I129 Hypertensive chronic kidney disease with stage 1 through stage 4 chronic kidney disease, or unspecified chronic kidney disease: Secondary | ICD-10-CM | POA: Diagnosis not present

## 2021-01-20 DIAGNOSIS — D539 Nutritional anemia, unspecified: Secondary | ICD-10-CM | POA: Diagnosis not present

## 2021-01-20 DIAGNOSIS — F332 Major depressive disorder, recurrent severe without psychotic features: Secondary | ICD-10-CM | POA: Diagnosis not present

## 2021-01-20 DIAGNOSIS — I872 Venous insufficiency (chronic) (peripheral): Secondary | ICD-10-CM | POA: Diagnosis not present

## 2021-01-20 DIAGNOSIS — M353 Polymyalgia rheumatica: Secondary | ICD-10-CM | POA: Diagnosis not present

## 2021-01-20 DIAGNOSIS — G4733 Obstructive sleep apnea (adult) (pediatric): Secondary | ICD-10-CM | POA: Diagnosis not present

## 2021-01-20 DIAGNOSIS — Z79891 Long term (current) use of opiate analgesic: Secondary | ICD-10-CM | POA: Diagnosis not present

## 2021-01-20 DIAGNOSIS — K219 Gastro-esophageal reflux disease without esophagitis: Secondary | ICD-10-CM | POA: Diagnosis not present

## 2021-01-20 DIAGNOSIS — Z7901 Long term (current) use of anticoagulants: Secondary | ICD-10-CM | POA: Diagnosis not present

## 2021-01-20 DIAGNOSIS — M4722 Other spondylosis with radiculopathy, cervical region: Secondary | ICD-10-CM | POA: Diagnosis not present

## 2021-01-20 DIAGNOSIS — Z9181 History of falling: Secondary | ICD-10-CM | POA: Diagnosis not present

## 2021-01-20 DIAGNOSIS — I739 Peripheral vascular disease, unspecified: Secondary | ICD-10-CM | POA: Diagnosis not present

## 2021-01-20 DIAGNOSIS — Z8701 Personal history of pneumonia (recurrent): Secondary | ICD-10-CM | POA: Diagnosis not present

## 2021-01-20 DIAGNOSIS — F411 Generalized anxiety disorder: Secondary | ICD-10-CM | POA: Diagnosis not present

## 2021-01-20 DIAGNOSIS — Z6830 Body mass index (BMI) 30.0-30.9, adult: Secondary | ICD-10-CM | POA: Diagnosis not present

## 2021-01-20 DIAGNOSIS — H401330 Pigmentary glaucoma, bilateral, stage unspecified: Secondary | ICD-10-CM | POA: Diagnosis not present

## 2021-01-20 DIAGNOSIS — E7800 Pure hypercholesterolemia, unspecified: Secondary | ICD-10-CM | POA: Diagnosis not present

## 2021-01-20 DIAGNOSIS — Q600 Renal agenesis, unilateral: Secondary | ICD-10-CM | POA: Diagnosis not present

## 2021-01-20 DIAGNOSIS — R627 Adult failure to thrive: Secondary | ICD-10-CM | POA: Diagnosis not present

## 2021-01-20 DIAGNOSIS — N1832 Chronic kidney disease, stage 3b: Secondary | ICD-10-CM | POA: Diagnosis not present

## 2021-01-24 DIAGNOSIS — M545 Low back pain, unspecified: Secondary | ICD-10-CM | POA: Diagnosis not present

## 2021-01-24 DIAGNOSIS — Z7689 Persons encountering health services in other specified circumstances: Secondary | ICD-10-CM | POA: Diagnosis not present

## 2021-01-24 DIAGNOSIS — I10 Essential (primary) hypertension: Secondary | ICD-10-CM | POA: Diagnosis not present

## 2021-01-24 DIAGNOSIS — Z79899 Other long term (current) drug therapy: Secondary | ICD-10-CM | POA: Diagnosis not present

## 2021-01-24 DIAGNOSIS — R42 Dizziness and giddiness: Secondary | ICD-10-CM | POA: Diagnosis not present

## 2021-01-24 DIAGNOSIS — Z9181 History of falling: Secondary | ICD-10-CM | POA: Diagnosis not present

## 2021-01-24 DIAGNOSIS — M79604 Pain in right leg: Secondary | ICD-10-CM | POA: Diagnosis not present

## 2021-01-24 DIAGNOSIS — R202 Paresthesia of skin: Secondary | ICD-10-CM | POA: Diagnosis not present

## 2021-02-14 DIAGNOSIS — Z6830 Body mass index (BMI) 30.0-30.9, adult: Secondary | ICD-10-CM | POA: Diagnosis not present

## 2021-02-14 DIAGNOSIS — I872 Venous insufficiency (chronic) (peripheral): Secondary | ICD-10-CM | POA: Diagnosis not present

## 2021-02-14 DIAGNOSIS — R627 Adult failure to thrive: Secondary | ICD-10-CM | POA: Diagnosis not present

## 2021-02-14 DIAGNOSIS — E7800 Pure hypercholesterolemia, unspecified: Secondary | ICD-10-CM | POA: Diagnosis not present

## 2021-02-14 DIAGNOSIS — E669 Obesity, unspecified: Secondary | ICD-10-CM | POA: Diagnosis not present

## 2021-02-14 DIAGNOSIS — I739 Peripheral vascular disease, unspecified: Secondary | ICD-10-CM | POA: Diagnosis not present

## 2021-02-14 DIAGNOSIS — Z8701 Personal history of pneumonia (recurrent): Secondary | ICD-10-CM | POA: Diagnosis not present

## 2021-02-14 DIAGNOSIS — M353 Polymyalgia rheumatica: Secondary | ICD-10-CM | POA: Diagnosis not present

## 2021-02-14 DIAGNOSIS — F411 Generalized anxiety disorder: Secondary | ICD-10-CM | POA: Diagnosis not present

## 2021-02-14 DIAGNOSIS — Q600 Renal agenesis, unilateral: Secondary | ICD-10-CM | POA: Diagnosis not present

## 2021-02-14 DIAGNOSIS — G4733 Obstructive sleep apnea (adult) (pediatric): Secondary | ICD-10-CM | POA: Diagnosis not present

## 2021-02-14 DIAGNOSIS — K219 Gastro-esophageal reflux disease without esophagitis: Secondary | ICD-10-CM | POA: Diagnosis not present

## 2021-02-14 DIAGNOSIS — N1832 Chronic kidney disease, stage 3b: Secondary | ICD-10-CM | POA: Diagnosis not present

## 2021-02-14 DIAGNOSIS — H401330 Pigmentary glaucoma, bilateral, stage unspecified: Secondary | ICD-10-CM | POA: Diagnosis not present

## 2021-02-14 DIAGNOSIS — I129 Hypertensive chronic kidney disease with stage 1 through stage 4 chronic kidney disease, or unspecified chronic kidney disease: Secondary | ICD-10-CM | POA: Diagnosis not present

## 2021-02-14 DIAGNOSIS — F332 Major depressive disorder, recurrent severe without psychotic features: Secondary | ICD-10-CM | POA: Diagnosis not present

## 2021-02-14 DIAGNOSIS — M4722 Other spondylosis with radiculopathy, cervical region: Secondary | ICD-10-CM | POA: Diagnosis not present

## 2021-02-14 DIAGNOSIS — D539 Nutritional anemia, unspecified: Secondary | ICD-10-CM | POA: Diagnosis not present

## 2021-02-14 DIAGNOSIS — Z7901 Long term (current) use of anticoagulants: Secondary | ICD-10-CM | POA: Diagnosis not present

## 2021-02-14 DIAGNOSIS — Z9181 History of falling: Secondary | ICD-10-CM | POA: Diagnosis not present

## 2021-02-14 DIAGNOSIS — Z79891 Long term (current) use of opiate analgesic: Secondary | ICD-10-CM | POA: Diagnosis not present

## 2021-03-03 ENCOUNTER — Other Ambulatory Visit (HOSPITAL_COMMUNITY): Payer: Self-pay | Admitting: Cardiovascular Disease

## 2021-03-03 DIAGNOSIS — Z95828 Presence of other vascular implants and grafts: Secondary | ICD-10-CM

## 2021-03-03 DIAGNOSIS — I739 Peripheral vascular disease, unspecified: Secondary | ICD-10-CM

## 2021-03-11 ENCOUNTER — Ambulatory Visit (HOSPITAL_COMMUNITY): Admission: RE | Admit: 2021-03-11 | Payer: PPO | Source: Ambulatory Visit

## 2021-03-11 ENCOUNTER — Other Ambulatory Visit (HOSPITAL_COMMUNITY): Payer: Self-pay | Admitting: Cardiovascular Disease

## 2021-03-11 ENCOUNTER — Ambulatory Visit (HOSPITAL_COMMUNITY)
Admission: RE | Admit: 2021-03-11 | Payer: PPO | Source: Ambulatory Visit | Attending: Cardiovascular Disease | Admitting: Cardiovascular Disease

## 2021-03-11 ENCOUNTER — Ambulatory Visit (HOSPITAL_COMMUNITY): Payer: PPO

## 2021-03-11 DIAGNOSIS — I6523 Occlusion and stenosis of bilateral carotid arteries: Secondary | ICD-10-CM

## 2021-03-11 DIAGNOSIS — Z95828 Presence of other vascular implants and grafts: Secondary | ICD-10-CM

## 2021-03-11 DIAGNOSIS — I739 Peripheral vascular disease, unspecified: Secondary | ICD-10-CM

## 2021-03-24 ENCOUNTER — Ambulatory Visit (HOSPITAL_COMMUNITY)
Admission: RE | Admit: 2021-03-24 | Discharge: 2021-03-24 | Disposition: A | Discharge status: Home / Self Care | Payer: PPO | Source: Ambulatory Visit | Attending: Cardiology | Admitting: Cardiology

## 2021-03-24 ENCOUNTER — Other Ambulatory Visit (HOSPITAL_COMMUNITY): Payer: Self-pay | Admitting: Cardiovascular Disease

## 2021-03-24 ENCOUNTER — Other Ambulatory Visit: Payer: Self-pay

## 2021-03-24 ENCOUNTER — Ambulatory Visit (HOSPITAL_BASED_OUTPATIENT_CLINIC_OR_DEPARTMENT_OTHER)
Admission: RE | Admit: 2021-03-24 | Discharge: 2021-03-24 | Disposition: A | Discharge status: Home / Self Care | Payer: PPO | Source: Ambulatory Visit | Attending: Cardiovascular Disease | Admitting: Cardiovascular Disease

## 2021-03-24 DIAGNOSIS — I739 Peripheral vascular disease, unspecified: Secondary | ICD-10-CM | POA: Insufficient documentation

## 2021-03-24 DIAGNOSIS — Z95828 Presence of other vascular implants and grafts: Secondary | ICD-10-CM | POA: Insufficient documentation

## 2021-03-24 DIAGNOSIS — I6523 Occlusion and stenosis of bilateral carotid arteries: Secondary | ICD-10-CM | POA: Insufficient documentation

## 2021-03-28 DIAGNOSIS — E7800 Pure hypercholesterolemia, unspecified: Secondary | ICD-10-CM | POA: Diagnosis not present

## 2021-03-28 DIAGNOSIS — E559 Vitamin D deficiency, unspecified: Secondary | ICD-10-CM | POA: Diagnosis not present

## 2021-03-28 DIAGNOSIS — Z79899 Other long term (current) drug therapy: Secondary | ICD-10-CM | POA: Diagnosis not present

## 2021-03-28 DIAGNOSIS — Z131 Encounter for screening for diabetes mellitus: Secondary | ICD-10-CM | POA: Diagnosis not present

## 2021-03-28 DIAGNOSIS — Z6831 Body mass index (BMI) 31.0-31.9, adult: Secondary | ICD-10-CM | POA: Diagnosis not present

## 2021-03-28 DIAGNOSIS — I10 Essential (primary) hypertension: Secondary | ICD-10-CM | POA: Diagnosis not present

## 2021-03-28 DIAGNOSIS — M545 Low back pain, unspecified: Secondary | ICD-10-CM | POA: Diagnosis not present

## 2021-03-28 DIAGNOSIS — M79604 Pain in right leg: Secondary | ICD-10-CM | POA: Diagnosis not present

## 2021-03-28 DIAGNOSIS — Z0000 Encounter for general adult medical examination without abnormal findings: Secondary | ICD-10-CM | POA: Diagnosis not present

## 2021-03-28 DIAGNOSIS — Z1159 Encounter for screening for other viral diseases: Secondary | ICD-10-CM | POA: Diagnosis not present

## 2021-03-28 DIAGNOSIS — R5383 Other fatigue: Secondary | ICD-10-CM | POA: Diagnosis not present

## 2021-03-28 DIAGNOSIS — Z9181 History of falling: Secondary | ICD-10-CM | POA: Diagnosis not present

## 2021-05-29 DIAGNOSIS — M545 Low back pain, unspecified: Secondary | ICD-10-CM | POA: Diagnosis not present

## 2021-05-29 DIAGNOSIS — Z6831 Body mass index (BMI) 31.0-31.9, adult: Secondary | ICD-10-CM | POA: Diagnosis not present

## 2021-05-29 DIAGNOSIS — Z9181 History of falling: Secondary | ICD-10-CM | POA: Diagnosis not present

## 2021-05-29 DIAGNOSIS — R202 Paresthesia of skin: Secondary | ICD-10-CM | POA: Diagnosis not present

## 2021-05-29 DIAGNOSIS — Z79899 Other long term (current) drug therapy: Secondary | ICD-10-CM | POA: Diagnosis not present

## 2021-05-29 DIAGNOSIS — M79604 Pain in right leg: Secondary | ICD-10-CM | POA: Diagnosis not present

## 2021-06-24 IMAGING — DX DG SHOULDER 2+V*L*
2 series · 2 of 2 positions shown · non-contrast
Comparison: None

CLINICAL DATA: Pain and limited range of motion.  No recent trauma.

EXAM:
LEFT SHOULDER - 2+ VIEW

[shoulder grashey]
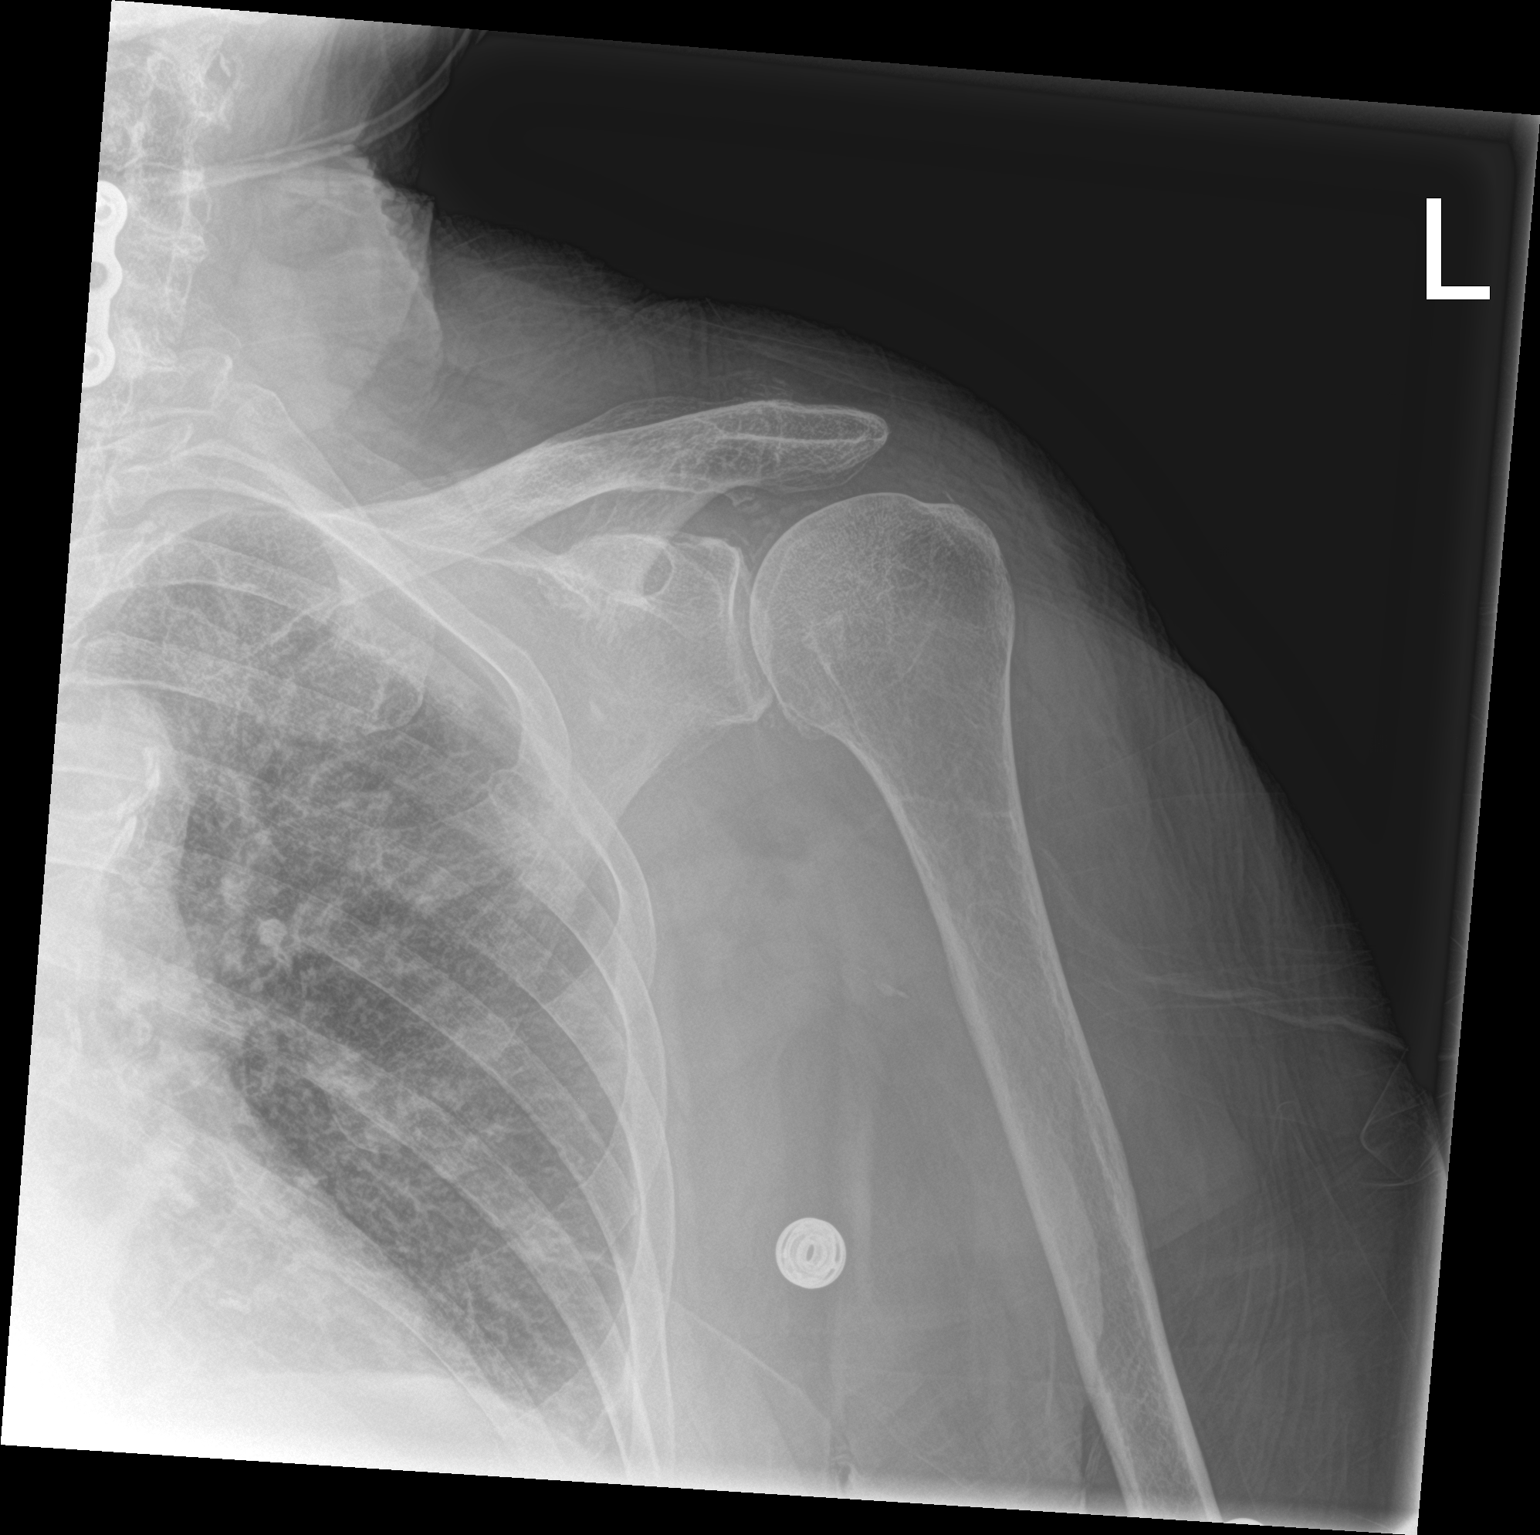

[shoulder y view]
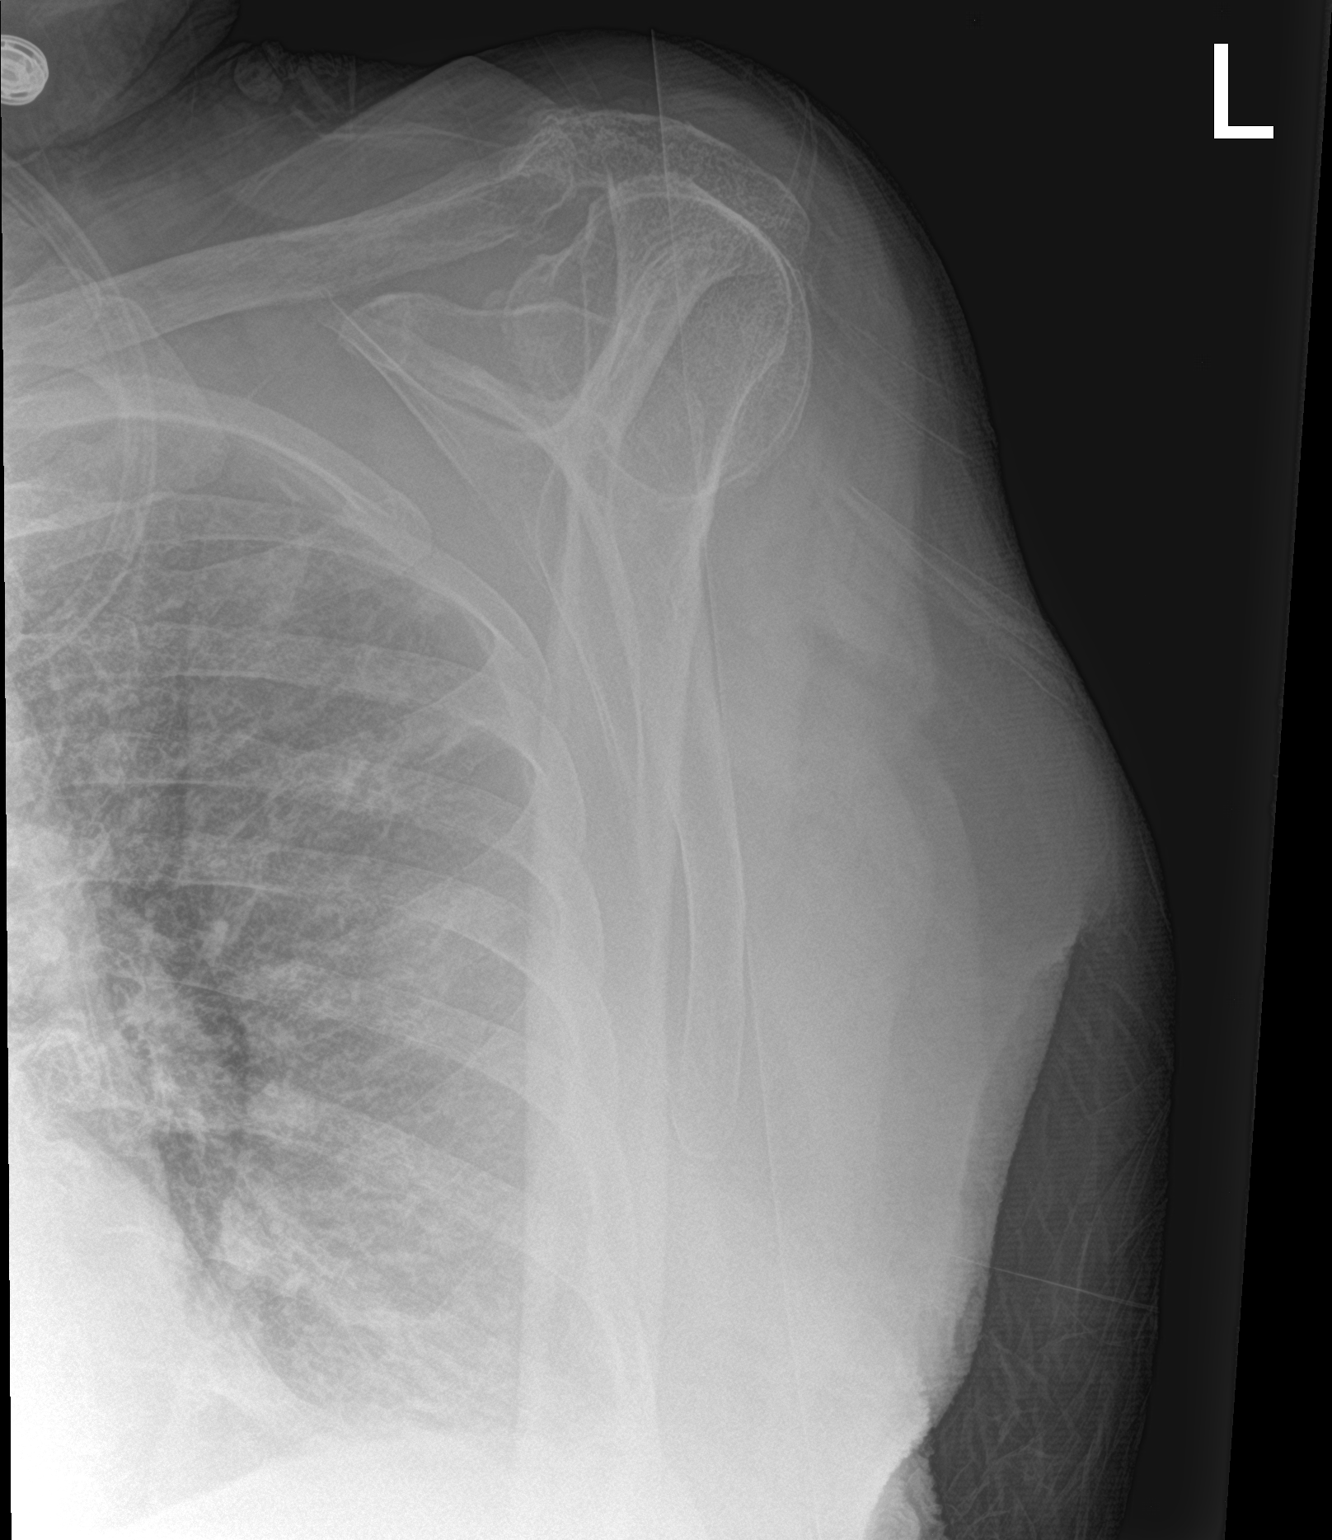

[2 of 2 positions shown; findings below may reference images not displayed]

FINDINGS: Mild and expected for age degenerative changes of the undersurface
of the acromion and humeral head. No acute fracture or dislocation.
Cervical spine fixation. Visualized portion of the left hemithorax
is normal.
IMPRESSION: No acute osseous abnormality.

## 2021-09-08 ENCOUNTER — Encounter (HOSPITAL_BASED_OUTPATIENT_CLINIC_OR_DEPARTMENT_OTHER): Payer: Medicare HMO | Attending: General Surgery | Admitting: General Surgery

## 2021-09-08 ENCOUNTER — Other Ambulatory Visit (HOSPITAL_BASED_OUTPATIENT_CLINIC_OR_DEPARTMENT_OTHER): Payer: Self-pay | Admitting: General Surgery

## 2021-09-08 ENCOUNTER — Other Ambulatory Visit: Payer: Self-pay

## 2021-09-08 DIAGNOSIS — L97211 Non-pressure chronic ulcer of right calf limited to breakdown of skin: Secondary | ICD-10-CM | POA: Insufficient documentation

## 2021-09-08 DIAGNOSIS — I2510 Atherosclerotic heart disease of native coronary artery without angina pectoris: Secondary | ICD-10-CM | POA: Diagnosis not present

## 2021-09-08 DIAGNOSIS — I739 Peripheral vascular disease, unspecified: Secondary | ICD-10-CM | POA: Insufficient documentation

## 2021-09-08 DIAGNOSIS — B9562 Methicillin resistant Staphylococcus aureus infection as the cause of diseases classified elsewhere: Secondary | ICD-10-CM | POA: Insufficient documentation

## 2021-09-08 DIAGNOSIS — L97221 Non-pressure chronic ulcer of left calf limited to breakdown of skin: Secondary | ICD-10-CM | POA: Insufficient documentation

## 2021-09-08 DIAGNOSIS — N183 Chronic kidney disease, stage 3 unspecified: Secondary | ICD-10-CM | POA: Diagnosis not present

## 2021-09-08 DIAGNOSIS — I87313 Chronic venous hypertension (idiopathic) with ulcer of bilateral lower extremity: Secondary | ICD-10-CM | POA: Insufficient documentation

## 2021-09-08 DIAGNOSIS — L97212 Non-pressure chronic ulcer of right calf with fat layer exposed: Secondary | ICD-10-CM | POA: Diagnosis not present

## 2021-09-08 DIAGNOSIS — I129 Hypertensive chronic kidney disease with stage 1 through stage 4 chronic kidney disease, or unspecified chronic kidney disease: Secondary | ICD-10-CM | POA: Diagnosis not present

## 2021-09-08 NOTE — Progress Notes (Signed)
[place], Kevia L. ([id number]) ?Visit Report for 09/08/2021 ?Abuse Risk Screen Details ?Patient Name: Date of Service: ?Welby, [name] 09/08/2021 1:15 PM ?Medical Record Number: [id number] ?Patient Account Number: [id number] ?Date of Birth/Sex: Treating RN: ?[birth year] (81 y.o. Female) Levan Hurst ?Primary Care Drury Ardizzone: [name] Other Clinician: ?Referring Gorman Safi: ?Treating Charleigh Correnti/Extender: Fredirick Maudlin ?Melvern Banker, PA T ?Weeks in Treatment: 0 ?Abuse Risk Screen Items ?Answer ?ABUSE RISK SCREEN: ?Has anyone close to you tried to hurt or harm you recentlyo No ?Do you feel uncomfortable with anyone in your familyo No ?Has anyone forced you do things that you didnt want to doo No ?Electronic Signature(s) ?Signed: 09/08/2021 5:54:42 PM By: Levan Hurst RN, BSN ?Entered By: Levan Hurst on 09/08/2021 14:05:49 ?-------------------------------------------------------------------------------- ?Activities of Daily Living Details ?Patient Name: Date of Service: ?Bair, [name] 09/08/2021 1:15 PM ?Medical Record Number: [id number] ?Patient Account Number: [id number] ?Date of Birth/Sex: Treating RN: ?12-14-1940 (81 y.o. Female) Levan Hurst ?Primary Care Nnamdi Dacus: [name] Other Clinician: ?Referring Azavier Creson: ?Treating Ellyssa Zagal/Extender: Fredirick Maudlin ?Melvern Banker, PA T ?Weeks in Treatment: 0 ?Activities of Daily Living Items ?Answer ?Activities of Daily Living (Please select one for each item) ?Drive Automobile Not Able ?T Medications ?ake Completely Able ?Use T elephone Completely Able ?Care for Appearance Completely Able ?Use T oilet Completely Able ?Bath / Shower Need Assistance ?Dress Self Completely Able ?Feed Self Completely Able ?Walk Completely Able ?Get In / Out Bed Completely Able ?Housework Need Assistance ?Prepare Meals Need Assistance ?Handle Money Completely Able ?Shop for Self Need Assistance ?Electronic Signature(s) ?Signed: 09/08/2021 5:54:42 PM By: Levan Hurst RN, BSN ?Entered By: Levan Hurst on  09/08/2021 14:06:21 ?-------------------------------------------------------------------------------- ?Education Screening Details ?Patient Name: ?Date of Service: ?Estrella, [name] 09/08/2021 1:15 PM ?Medical Record Number: [id number] ?Patient Account Number: [id number] ?Date of Birth/Sex: ?Treating RN: ?07-11-1940 (81 y.o. Female) Levan Hurst ?Primary Care Fruma [place]: [name] ?Other Clinician: ?Referring Beckett Hickmon: ?Treating Garren Greenman/Extender: Fredirick Maudlin ?Melvern Banker, PA T ?Weeks in Treatment: 0 ?Primary Learner Assessed: Patient ?Learning Preferences/Education Level/Primary Language ?Learning Preference: Explanation, Demonstration, Printed Material ?Highest Education Level: High School ?Preferred Language: English ?Cognitive Barrier ?Language Barrier: No ?Translator Needed: No ?Memory Deficit: No ?Emotional Barrier: No ?Cultural/Religious Beliefs Affecting Medical Care: No ?Physical Barrier ?Impaired Vision: No ?Impaired Hearing: No ?Decreased Hand dexterity: No ?Knowledge/Comprehension ?Knowledge Level: High ?Comprehension Level: High ?Ability to understand written instructions: High ?Ability to understand verbal instructions: High ?Motivation ?Anxiety Level: Calm ?Cooperation: Cooperative ?Education Importance: Acknowledges Need ?Interest in Health Problems: Asks Questions ?Perception: Coherent ?Willingness to Engage in Self-Management High ?Activities: ?Readiness to Engage in Self-Management High ?Activities: ?Electronic Signature(s) ?Signed: 09/08/2021 5:54:42 PM By: Levan Hurst RN, BSN ?Entered By: Levan Hurst on 09/08/2021 14:06:38 ?-------------------------------------------------------------------------------- ?Fall Risk Assessment Details ?Patient Name: ?Date of Service: ?Rohl, [name] 09/08/2021 1:15 PM ?Medical Record Number: [id number] ?Patient Account Number: [id number] ?Date of Birth/Sex: ?Treating RN: ?03/11/1941 (81 y.o. Female) Levan Hurst ?Primary Care Karyl Sharrar: [name] ?Other  Clinician: ?Referring Kazi Reppond: ?Treating Keela Rubert/Extender: Fredirick Maudlin ?Melvern Banker, PA T ?Weeks in Treatment: 0 ?Fall Risk Assessment Items ?Have you had 2 or more falls in the last 12 monthso 0 No ?Have you had any fall that resulted in injury in the last 12 monthso 0 No ?FALLS RISK SCREEN ?History of falling - immediate or within 3 months 0 No ?Secondary diagnosis (Do you have 2 or more medical diagnoseso) 15 Yes ?Ambulatory aid ?None/bed rest/wheelchair/nurse 0 No ?Crutches/cane/walker 15 Yes ?Furniture 0 No ?Intravenous therapy Access/Saline/Heparin Lock 0 No ?Gait/Transferring ?Normal/ bed  rest/ wheelchair 0 Yes ?Weak (short steps with or without shuffle, stooped but able to lift head while walking, may seek 0 No ?support from furniture) ?Impaired (short steps with shuffle, may have difficulty arising from chair, head down, impaired 0 No ?balance) ?Mental Status ?Oriented to own ability 0 Yes ?Electronic Signature(s) ?Signed: 09/08/2021 5:54:42 PM By: Levan Hurst RN, BSN ?Entered By: Levan Hurst on 09/08/2021 14:06:57 ?-------------------------------------------------------------------------------- ?Foot Assessment Details ?Patient Name: ?Date of Service: ?Pritt, [name] 09/08/2021 1:15 PM ?Medical Record Number: [id number] ?Patient Account Number: [id number] ?Date of Birth/Sex: ?Treating RN: ?October 22, 1940 (81 y.o. Female) Levan Hurst ?Primary Care Emmaline Wahba: [name] ?Other Clinician: ?Referring Quaniya Damas: ?Treating Maximiliano Cromartie/Extender: Fredirick Maudlin ?Melvern Banker, PA T ?Weeks in Treatment: 0 ?Foot Assessment Items ?Site Locations ?+ = Sensation present, - = Sensation absent, C = Callus, U = Ulcer ?R = Redness, W = Warmth, M = Maceration, PU = Pre-ulcerative lesion ?F = Fissure, S = Swelling, D = Dryness ?Assessment ?Right: Left: ?Other Deformity: No No ?Prior Foot Ulcer: No No ?Prior Amputation: No No ?Charcot Joint: No No ?Ambulatory Status: Ambulatory With Help ?Assistance Device: Walker ?Gait:  Steady ?Electronic Signature(s) ?Signed: 09/08/2021 5:54:42 PM By: Levan Hurst RN, BSN ?Entered By: Levan Hurst on 09/08/2021 14:07:41 ?-------------------------------------------------------------------------------- ?Nutrition Risk Screening Details ?Patient Name: ?Date of Service: ?Siebenaler, [name] 09/08/2021 1:15 PM ?Medical Record Number: [id number] ?Patient Account Number: [id number] ?Date of Birth/Sex: ?Treating RN: ?[birth year] (81 y.o. Female) Levan Hurst ?Primary Care Myrla Malanowski: [name] ?Other Clinician: ?Referring Dimitrious Micciche: ?Treating Gila Lauf/Extender: Fredirick Maudlin ?Melvern Banker, PA T ?Weeks in Treatment: 0 ?Height (in): 60 ?Weight (lbs): 148 ?Body Mass Index (BMI): 28.9 ?Nutrition Risk Screening Items ?Score Screening ?NUTRITION RISK SCREEN: ?I have an illness or condition that made me change the kind and/or amount of food I eat 0 No ?I eat fewer than two meals per day 0 No ?I eat few fruits and vegetables, or milk products 0 No ?I have three or more drinks of beer, liquor or wine almost every day 0 No ?I have tooth or mouth problems that make it hard for me to eat 0 No ?I don't always have enough money to buy the food I need 0 No ?I eat alone most of the time 0 No ?I take three or more different prescribed or over-the-counter drugs a day 1 Yes ?Without wanting to, I have lost or gained 10 pounds in the last six months 0 No ?I am not always physically able to shop, cook and/or feed myself 2 Yes ?Nutrition Protocols ?Good Risk Protocol ?Moderate Risk Protocol 0 Provide education on nutrition ?High Risk Proctocol ?Risk Level: Moderate Risk ?Score: 3 ?Electronic Signature(s) ?Signed: 09/08/2021 5:54:42 PM By: Levan Hurst RN, BSN ?Entered By: Levan Hurst on 09/08/2021 14:07:03 ?

## 2021-09-08 NOTE — Progress Notes (Signed)
[name] [name], [name] [name] ([id number]) Visit Report for 09/08/2021 Allergy List Details Patient Name: Date of Service: [place], [name] 09/08/2021 1:15 PM Medical Record Number: [id number] Patient Account Number: [id number] Date of Birth/Sex: Treating RN: [birth year] (81 y.o. Female) [name] [name] Primary Care [name] [name]: [name] [name] Other Clinician: Referring [name] [name]: Treating [name] [name]/Extender: [name] Slipper, PA T Weeks in Treatment: 0 Allergies Active Allergies Lipitor Reaction: anaphylaxis Severity: Severe Allergy Notes Electronic Signature(s) Signed: 09/08/2021 5:54:42 PM By: [name] Hurst RN, BSN Entered By: [name] [name] on 09/08/2021 13:41:51 -------------------------------------------------------------------------------- Arrival Information Details Patient Name: Date of Service: [name], [name] 09/08/2021 1:15 PM Medical Record Number: [id number] Patient Account Number: [id number] Date of Birth/Sex: Treating RN: [name] (80 y.o. Female) [name] [name] Primary Care [name] [name]: [name] [name] Other Clinician: Referring [name] [name]: Treating Reina Wilton/Extender: [name] Slipper, PA T Weeks in Treatment: 0 Visit Information Patient Arrived: [name] [name] Time: 13:32 Accompanied By: daughter Transfer Assistance: None Patient Identification Verified: Yes Secondary Verification Process Completed: Yes Patient Has Alerts: Yes Patient Alerts: R ABI: 1.0 TBI: 0.76 L ABI: 0.97 TBI: 0.57 Electronic Signature(s) Signed: 09/08/2021 5:54:42 PM By: [name] Hurst RN, BSN Entered By: [name] [name] on 09/08/2021 13:40:46 -------------------------------------------------------------------------------- Clinic Level of Care Assessment Details Patient Name: Date of Service: [name], [name] 09/08/2021 1:15 PM Medical Record Number: [id number] Patient Account Number: [id number] Date of Birth/Sex: Treating RN: [name] [name] (81 y.o. Female) [name] [name] Primary Care Evgenia Merriman: [name] [name]  Other Clinician: Referring [name] [name]: Treating [name] [name]/Extender: [name] Slipper, PA T Weeks in Treatment: 0 Clinic Level of Care Assessment Items TOOL 1 Quantity Score X- 1 0 Use when EandM and Procedure is performed on INITIAL visit ASSESSMENTS - Nursing Assessment / Reassessment X- 1 20 General Physical Exam (combine w/ comprehensive assessment (listed just below) when performed on new pt. evals) X- 1 25 Comprehensive Assessment (HX, ROS, Risk Assessments, Wounds Hx, etc.) ASSESSMENTS - Wound and Skin Assessment / Reassessment []  - 0 Dermatologic / Skin Assessment (not related to wound area) ASSESSMENTS - Ostomy and/or Continence Assessment and Care []  - 0 Incontinence Assessment and Management []  - 0 Ostomy Care Assessment and Management (repouching, etc.) PROCESS - Coordination of Care X - Simple Patient / Family Education for ongoing care 1 15 []  - 0 Complex (extensive) Patient / Family Education for ongoing care X- 1 10 Staff obtains Programmer, systems, Records, T Results / Process Orders est X- 1 10 Staff telephones HHA, Nursing Homes / Clarify orders / etc []  - 0 Routine Transfer to another Facility (non-emergent condition) []  - 0 Routine Hospital Admission (non-emergent condition) X- 1 15 New Admissions / Biomedical engineer / Ordering NPWT Apligraf, etc. , []  - 0 Emergency Hospital Admission (emergent condition) PROCESS - Special Needs []  - 0 Pediatric / Minor Patient Management []  - 0 Isolation Patient Management []  - 0 Hearing / Language / Visual special needs []  - 0 Assessment of Community assistance (transportation, D/C planning, etc.) []  - 0 Additional assistance / Altered mentation []  - 0 Support Surface(s) Assessment (bed, cushion, seat, etc.) INTERVENTIONS - Miscellaneous []  - 0 External ear exam []  - 0 Patient Transfer (multiple staff / Civil Service fast streamer / Similar devices) []  - 0 Simple Staple / Suture removal (25 or less) []  - 0 Complex  Staple / Suture removal (26 or more) []  - 0 Hypo/Hyperglycemic Management (do not check if billed separately) []  - 0 Ankle / Brachial Index (ABI) - do not check if billed separately Has the patient been seen at the hospital  within the last three years: Yes Total Score: 95 Level Of Care: New/Established - Level 3 Electronic Signature(s) Signed: 09/08/2021 5:54:42 PM By: [name] Hurst RN, BSN Entered By: [name] [name] on 09/08/2021 17:44:11 -------------------------------------------------------------------------------- Compression Therapy Details Patient Name: Date of Service: [name], [name] 09/08/2021 1:15 PM Medical Record Number: [id number] Patient Account Number: [id number] Date of Birth/Sex: Treating RN: [name] (81 y.o. Female) [name] [name] Primary Care [name] [name]: [name] [name] Other Clinician: Referring [name] [name]: Treating [name] [name]/Extender: [name] Slipper, PA T Weeks in Treatment: 0 Compression Therapy Performed for Wound Assessment: Wound #1 Left,Anterior Lower Leg Performed By: Clinician [name] Hurst, RN Compression Type: Three Layer Post Procedure Diagnosis Same as Pre-procedure Electronic Signature(s) Signed: 09/08/2021 5:54:42 PM By: [name] Hurst RN, BSN Entered By: [name] [name] on 09/08/2021 14:34:17 -------------------------------------------------------------------------------- Compression Therapy Details Patient Name: Date of Service: [name], [name] 09/08/2021 1:15 PM Medical Record Number: [id number] Patient Account Number: [id number] Date of Birth/Sex: Treating RN: [birth year] (81 y.o. Female) [name] [name] Primary Care Talecia Sherlin: [name] [name] Other Clinician: Referring [name] [name]: Treating [name] [name]/Extender: [name] Slipper, PA T Weeks in Treatment: 0 Compression Therapy Performed for Wound Assessment: Wound #2 Right,Posterior Lower Leg Performed By: Clinician [name] Hurst, RN Compression Type: Three Layer Post Procedure  Diagnosis Same as Pre-procedure Electronic Signature(s) Signed: 09/08/2021 5:54:42 PM By: [name] Hurst RN, BSN Entered By: [name] [name] on 09/08/2021 14:34:17 -------------------------------------------------------------------------------- Compression Therapy Details Patient Name: Date of Service: [place], [name] 09/08/2021 1:15 PM Medical Record Number: [id number] Patient Account Number: [id number] Date of Birth/Sex: Treating RN: [birth year][name] (81 y.o. Female) [name] [name] Primary Care Kycen Spalla: [name] [name] Other Clinician: Referring Takuya Lariccia: Treating Mabrey Howland/Extender: [name] Slipper, PA T Weeks in Treatment: 0 Compression Therapy Performed for Wound Assessment: Wound #3 Right,Lateral Lower Leg Performed By: Clinician [name] Hurst, RN Compression Type: Three Layer Post Procedure Diagnosis Same as Pre-procedure Electronic Signature(s) Signed: 09/08/2021 5:54:42 PM By: [name] Hurst RN, BSN Entered By: [name] [name] on 09/08/2021 14:34:17 -------------------------------------------------------------------------------- Encounter Discharge Information Details Patient Name: Date of Service: [place], [name] 09/08/2021 1:15 PM Medical Record Number: [id number] Patient Account Number: [id number] Date of Birth/Sex: Treating RN: [birth year][name] (81 y.o. Female) [name] [name] Primary Care Hammad Finkler: [name] [name] Other Clinician: Referring Arpan Eskelson: Treating Davonne Baby/Extender: [name] Slipper, PA T Weeks in Treatment: 0 Encounter Discharge Information Items Post Procedure Vitals Discharge Condition: Stable Temperature (F): 98.0 Ambulatory Status: Walker Pulse (bpm): 98 Discharge Destination: Home Respiratory Rate (breaths/min): 16 Transportation: Private Auto Blood Pressure (mmHg): 131/78 Accompanied By: daughter Schedule Follow-up Appointment: Yes Clinical Summary of Care: Patient Declined Electronic Signature(s) Signed: 09/08/2021 5:54:42 PM By: [name] Hurst RN, BSN Entered By: [name] [name] on 09/08/2021 17:45:37 -------------------------------------------------------------------------------- Lower Extremity Assessment Details Patient Name: Date of Service: [place], [name] 09/08/2021 1:15 PM Medical Record Number: [id number] Patient Account Number: [id number] Date of Birth/Sex: Treating RN: [birth year][name] (81 y.o. Female) [name] [name] Primary Care Samiyah Stupka: [name] [name] Other Clinician: Referring Courtenay Hirth: Treating Trenice Mesa/Extender: [name] Slipper, PA T Weeks in Treatment: 0 Edema Assessment Assessed: [Left: No] [Right: No] Edema: [Left: Yes] [Right: Yes] Calf Left: Right: Point of Measurement: 8 cm From Medial Instep 36 cm 35 cm Ankle Left: Right: Point of Measurement: 27 cm From Medial Instep 25.5 cm 24.5 cm Knee To Floor Left: Right: From Medial Instep 33 cm 33 cm Vascular Assessment Pulses: Dorsalis Pedis Palpable: [Left:Yes] [Right:Yes] Electronic Signature(s) Signed: 09/08/2021 5:54:42 PM By: [name] Hurst RN, BSN Entered By: [name] [name] on 09/08/2021 14:02:21 -------------------------------------------------------------------------------- Multi Wound  Chart Details Patient Name: Date of Service: [place], [name] 09/08/2021 1:15 PM Medical Record Number: [id number] Patient Account Number: [id number] Date of Birth/Sex: Treating RN: [birth year][name] (81 y.o. Female) Primary Care Jash Wahlen: [name] [name] Other Clinician: Referring Fredi Hurtado: Treating Saniyya Gau/Extender: [name] Slipper, PA T Weeks in Treatment: 0 Vital Signs Height(in): 60 Pulse(bpm): 98 Weight(lbs): 148 Blood Pressure(mmHg): 131/78 Body Mass Index(BMI): 28.9 Temperature(F): 98.0 Respiratory Rate(breaths/min): 16 Photos: Left, Anterior Lower Leg Right, Posterior Lower Leg Right, Lateral Lower Leg Wound Location: Trauma Trauma Trauma Wounding Event: Venous Leg Ulcer Venous Leg Ulcer Venous Leg Ulcer Primary  Etiology: 09/01/2021 08/10/2021 08/10/2021 Date Acquired: 0 0 0 Weeks of Treatment: Open Open Open Wound Status: No No No Wound Recurrence: 1.8x1.2x0.1 2x2.5x0.2 0.8x0.8x0.1 Measurements L x W x D (cm) 1.696 3.927 0.503 A (cm) : rea 0.17 0.785 0.05 Volume (cm) : 0.00% 0.00% 0.00% % Reduction in A rea: 0.00% 0.00% 0.00% % Reduction in Volume: Full Thickness Without Exposed Full Thickness Without Exposed Full Thickness Without Exposed Classification: Support Structures Support Structures Support Structures Medium Medium Medium Exudate Amount: Serosanguineous Serosanguineous Serosanguineous Exudate Type: red, brown red, brown red, brown Exudate Color: Flat and Intact Distinct, outline attached Flat and Intact Wound Margin: Large (67-100%) Large (67-100%) Large (67-100%) Granulation Amount: Red, Pink Red, Pink Red, Pink Granulation Quality: None Present (0%) Small (1-33%) None Present (0%) Necrotic Amount: Fat Layer (Subcutaneous Tissue): Yes Fat Layer (Subcutaneous Tissue): Yes Fat Layer (Subcutaneous Tissue): Yes Exposed Structures: Fascia: No Fascia: No Fascia: No Tendon: No Tendon: No Tendon: No Muscle: No Muscle: No Muscle: No Joint: No Joint: No Joint: No Bone: No Bone: No Bone: No None None None Epithelialization: Compression Therapy Biopsy Compression Therapy Procedures Performed: Compression Therapy Treatment Notes Electronic Signature(s) Signed: 09/08/2021 2:44:06 PM By: Fredirick Maudlin MD FACS Entered By: Fredirick Maudlin on 09/08/2021 14:44:06 -------------------------------------------------------------------------------- Multi-Disciplinary Care Plan Details Patient Name: Date of Service: [place], [name] 09/08/2021 1:15 PM Medical Record Number: [id number] Patient Account Number: [id number] Date of Birth/Sex: Treating RN: [birth year] [name] (81 y.o. Female) [name] [name] Primary Care Elber Galyean: [name] [name] Other Clinician: Referring  Jermond Burkemper: Treating Maki Sweetser/Extender: [name] Slipper, PA T Weeks in Treatment: 0 Multidisciplinary Care Plan reviewed with physician Active Inactive Abuse / Safety / Falls / Self Care Management Nursing Diagnoses: Potential for falls Potential for injury related to falls Goals: Patient will not experience any injury related to falls Date Initiated: 09/08/2021 Target Resolution Date: 10/07/2021 Goal Status: Active Patient/caregiver will verbalize/demonstrate measures taken to improve the patient's personal safety Date Initiated: 09/08/2021 Target Resolution Date: 10/07/2021 Goal Status: Active Patient/caregiver will verbalize/demonstrate measures taken to prevent injury and/or falls Date Initiated: 09/08/2021 Target Resolution Date: 10/07/2021 Goal Status: Active Interventions: Assess Activities of Daily Living upon admission and as needed Assess fall risk on admission and as needed Assess: immobility, friction, shearing, incontinence upon admission and as needed Assess impairment of mobility on admission and as needed per policy Assess personal safety and home safety (as indicated) on admission and as needed Assess self care needs on admission and as needed Provide education on fall prevention Provide education on personal and home safety Notes: Wound/Skin Impairment Nursing Diagnoses: Impaired tissue integrity Knowledge deficit related to ulceration/compromised skin integrity Goals: Patient/caregiver will verbalize understanding of skin care regimen Date Initiated: 09/08/2021 Target Resolution Date: 10/07/2021 Goal Status: Active Ulcer/skin breakdown will have a volume reduction of 30% by week 4 Date Initiated: 09/08/2021 Target Resolution Date: 10/07/2021 Goal Status: Active Interventions: Assess patient/caregiver ability to obtain  necessary supplies Assess patient/caregiver ability to perform ulcer/skin care regimen upon admission and as needed Assess ulceration(s)  every visit Provide education on ulcer and skin care Notes: Electronic Signature(s) Signed: 09/08/2021 5:54:42 PM By: [name] Hurst RN, BSN Entered By: [name] [name] on 09/08/2021 14:14:58 -------------------------------------------------------------------------------- Pain Assessment Details Patient Name: Date of Service: [place], [name] 09/08/2021 1:15 PM Medical Record Number: [id number] Patient Account Number: [id number] Date of Birth/Sex: Treating RN: [birth year][name] (81 y.o. Female) [name] [name] Primary Care Wrigley Winborne: [name] [name] Other Clinician: Referring Runa Whittingham: Treating Renia Mikelson/Extender: [name] Slipper, PA T Weeks in Treatment: 0 Active Problems Location of Pain Severity and Description of Pain Patient Has Paino Yes Site Locations Pain Location: Pain in Ulcers With Dressing Change: Yes Duration of the Pain. Constant / Intermittento Intermittent Rate the pain. Current Pain Level: 5 Worst Pain Level: 8 Least Pain Level: 0 Character of Pain Describe the Pain: Burning Pain Management and Medication Current Pain Management: Medication: Yes Cold Application: No Rest: No Massage: No Activity: No T.E.N.S.: No Heat Application: No Leg drop or elevation: No Is the Current Pain Management Adequate: Adequate How does your wound impact your activities of daily livingo Sleep: No Bathing: No Appetite: No Relationship With Others: No Bladder Continence: No Emotions: No Bowel Continence: No Work: No Toileting: No Drive: No Dressing: No Hobbies: No Engineer, maintenance) Signed: 09/08/2021 5:54:42 PM By: [name] Hurst RN, BSN Entered By: [name] [name] on 09/08/2021 14:02:53 -------------------------------------------------------------------------------- Patient/Caregiver Education Details Patient Name: Date of Service: Kandis Cocking 3/2/2023andnbsp1:15 PM Medical Record Number: [id number] Patient Account Number: [id number] Date of  Birth/Gender: Treating RN: [birth year] [name] (81 y.o. Female) [name] [name] Primary Care Physician: [name] [name] Other Clinician: Referring Physician: Treating Physician/Extender: [name] Slipper, PA T Weeks in Treatment: 0 Education Assessment Education Provided To: Patient Education Topics Provided Safety: Methods: Explain/Verbal Responses: State content correctly Wound/Skin Impairment: Methods: Explain/Verbal Responses: State content correctly Electronic Signature(s) Signed: 09/08/2021 5:54:42 PM By: [name] Hurst RN, BSN Entered By: [name] [name] on 09/08/2021 14:23:50 -------------------------------------------------------------------------------- Wound Assessment Details Patient Name: Date of Service: [place], [name] 09/08/2021 1:15 PM Medical Record Number: [id number] Patient Account Number: [id number] Date of Birth/Sex: Treating RN: [birth year] (80 y.o. Female) [name] [name] Primary Care Esa Raden: [name] [name] Other Clinician: Referring Mairim Bade: Treating Hydie Langan/Extender: [name] Slipper, PA T Weeks in Treatment: 0 Wound Status Wound Number: 1 Primary Etiology: Venous Leg Ulcer Wound Location: Left, Anterior Lower Leg Wound Status: Open Wounding Event: Trauma Date Acquired: 09/01/2021 Weeks Of Treatment: 0 Clustered Wound: No Photos Wound Measurements Length: (cm) 1.8 Width: (cm) 1.2 Depth: (cm) 0.1 Area: (cm) 1.696 Volume: (cm) 0.17 % Reduction in Area: 0% % Reduction in Volume: 0% Epithelialization: None Tunneling: No Undermining: No Wound Description Classification: Full Thickness Without Exposed Support Structures Wound Margin: Flat and Intact Exudate Amount: Medium Exudate Type: Serosanguineous Exudate Color: red, brown Foul Odor After Cleansing: No Slough/Fibrino No Wound Bed Granulation Amount: Large (67-100%) Exposed Structure Granulation Quality: Red, Pink Fascia Exposed: No Necrotic Amount: None Present (0%) Fat  Layer (Subcutaneous Tissue) Exposed: Yes Tendon Exposed: No Muscle Exposed: No Joint Exposed: No Bone Exposed: No Treatment Notes Wound #1 (Lower Leg) Wound Laterality: Left, Anterior Cleanser Soap and Water Discharge Instruction: May shower and wash wound with dial antibacterial soap and water prior to dressing change. Wound Cleanser Discharge Instruction: Cleanse the wound with wound cleanser prior to applying a clean dressing using gauze sponges, not tissue or cotton balls. Peri-Wound Care Sween Lotion (Moisturizing lotion) Discharge  Instruction: Apply moisturizing lotion as directed Topical Primary Dressing Maxorb Extra Calcium Alginate 2x2 in Discharge Instruction: Apply calcium alginate to wound bed as instructed Secondary Dressing Woven Gauze Sponge, Non-Sterile 4x4 in Discharge Instruction: Apply over primary dressing as directed. Secured With Compression Wrap ThreePress (3 layer compression wrap) Discharge Instruction: Apply three layer compression as directed. Compression Stockings Add-Ons Electronic Signature(s) Signed: 09/08/2021 5:54:42 PM By: [name] Hurst RN, BSN Entered By: [name] [name] on 09/08/2021 13:56:47 -------------------------------------------------------------------------------- Wound Assessment Details Patient Name: Date of Service: [place], [name] 09/08/2021 1:15 PM Medical Record Number: [id number] Patient Account Number: [id number] Date of Birth/Sex: Treating RN: [birth year] (81 y.o. Female) [name] [name] Primary Care Barton Want: [name] [name] Other Clinician: Referring Tadarrius Burch: Treating Suhana Wilner/Extender: [name] Slipper, PA T Weeks in Treatment: 0 Wound Status Wound Number: 2 Primary Etiology: Venous Leg Ulcer Wound Location: Right, Posterior Lower Leg Wound Status: Open Wounding Event: Trauma Date Acquired: 08/10/2021 Weeks Of Treatment: 0 Clustered Wound: No Photos Wound Measurements Length: (cm) 2 Width: (cm) 2.5 Depth:  (cm) 0.2 Area: (cm) 3.927 Volume: (cm) 0.785 % Reduction in Area: 0% % Reduction in Volume: 0% Epithelialization: None Tunneling: No Undermining: No Wound Description Classification: Full Thickness Without Exposed Support Structures Wound Margin: Distinct, outline attached Exudate Amount: Medium Exudate Type: Serosanguineous Exudate Color: red, brown Foul Odor After Cleansing: No Slough/Fibrino Yes Wound Bed Granulation Amount: Large (67-100%) Exposed Structure Granulation Quality: Red, Pink Fascia Exposed: No Necrotic Amount: Small (1-33%) Fat Layer (Subcutaneous Tissue) Exposed: Yes Necrotic Quality: Adherent Slough Tendon Exposed: No Muscle Exposed: No Joint Exposed: No Bone Exposed: No Treatment Notes Wound #2 (Lower Leg) Wound Laterality: Right, Posterior Cleanser Soap and Water Discharge Instruction: May shower and wash wound with dial antibacterial soap and water prior to dressing change. Wound Cleanser Discharge Instruction: Cleanse the wound with wound cleanser prior to applying a clean dressing using gauze sponges, not tissue or cotton balls. Peri-Wound Care Sween Lotion (Moisturizing lotion) Discharge Instruction: Apply moisturizing lotion as directed Topical Primary Dressing PolyMem Silver Non-Adhesive Dressing, 4.25x4.25 in Discharge Instruction: Apply to wound bed as instructed Secondary Dressing Woven Gauze Sponge, Non-Sterile 4x4 in Discharge Instruction: Apply over primary dressing as directed. Secured With Compression Wrap ThreePress (3 layer compression wrap) Discharge Instruction: Apply three layer compression as directed. Compression Stockings Add-Ons Electronic Signature(s) Signed: 09/08/2021 5:54:42 PM By: [name] Hurst RN, BSN Entered By: [name] [name] on 09/08/2021 13:57:29 -------------------------------------------------------------------------------- Wound Assessment Details Patient Name: Date of Service: [place], [name] 09/08/2021  1:15 PM Medical Record Number: [id number] Patient Account Number: [id number] Date of Birth/Sex: Treating RN: [birth year][name] (81 y.o. Female) [name] [name] Primary Care Lian Pounds: [name] [name] Other Clinician: Referring Tuwanda Vokes: Treating Idalis Hoelting/Extender: [name] Slipper, PA T Weeks in Treatment: 0 Wound Status Wound Number: 3 Primary Etiology: Venous Leg Ulcer Wound Location: Right, Lateral Lower Leg Wound Status: Open Wounding Event: Trauma Date Acquired: 08/10/2021 Weeks Of Treatment: 0 Clustered Wound: No Photos Wound Measurements Length: (cm) 0.8 Width: (cm) 0.8 Depth: (cm) 0.1 Area: (cm) 0.503 Volume: (cm) 0.05 % Reduction in Area: 0% % Reduction in Volume: 0% Epithelialization: None Tunneling: No Undermining: No Wound Description Classification: Full Thickness Without Exposed Support Structures Wound Margin: Flat and Intact Exudate Amount: Medium Exudate Type: Serosanguineous Exudate Color: red, brown Foul Odor After Cleansing: No Slough/Fibrino No Wound Bed Granulation Amount: Large (67-100%) Exposed Structure Granulation Quality: Red, Pink Fascia Exposed: No Necrotic Amount: None Present (0%) Fat Layer (Subcutaneous Tissue) Exposed: Yes Tendon Exposed: No Muscle Exposed: No Joint  Exposed: No Bone Exposed: No Treatment Notes Wound #3 (Lower Leg) Wound Laterality: Right, Lateral Cleanser Soap and Water Discharge Instruction: May shower and wash wound with dial antibacterial soap and water prior to dressing change. Wound Cleanser Discharge Instruction: Cleanse the wound with wound cleanser prior to applying a clean dressing using gauze sponges, not tissue or cotton balls. Peri-Wound Care Sween Lotion (Moisturizing lotion) Discharge Instruction: Apply moisturizing lotion as directed Topical Primary Dressing Maxorb Extra Calcium Alginate 2x2 in Discharge Instruction: Apply calcium alginate to wound bed as instructed Secondary Dressing Woven  Gauze Sponge, Non-Sterile 4x4 in Discharge Instruction: Apply over primary dressing as directed. Secured With Compression Wrap ThreePress (3 layer compression wrap) Discharge Instruction: Apply three layer compression as directed. Compression Stockings Add-Ons Electronic Signature(s) Signed: 09/08/2021 5:54:42 PM By: [name] Hurst RN, BSN Entered By: [name] [name] on 09/08/2021 13:58:05 -------------------------------------------------------------------------------- Welsh Details Patient Name: Date of Service: [place], [name] 09/08/2021 1:15 PM Medical Record Number: [id number] Patient Account Number: [id number] Date of Birth/Sex: Treating RN: [birth year][name] (81 y.o. Female) [name] [name] Primary Care Gae Bihl: [name] [name] Other Clinician: Referring Numan Zylstra: Treating Kamil Hanigan/Extender: [name] Slipper, PA T Weeks in Treatment: 0 Vital Signs Time Taken: 13:40 Temperature (F): 98.0 Height (in): 60 Pulse (bpm): 98 Source: Stated Respiratory Rate (breaths/min): 16 Weight (lbs): 148 Blood Pressure (mmHg): 131/78 Source: Stated Reference Range: 80 - 120 mg / dl Body Mass Index (BMI): 28.9 Electronic Signature(s) Signed: 09/08/2021 5:54:42 PM By: [name] Hurst RN, BSN Entered By: [name] [name] on 09/08/2021 13:41:11

## 2021-09-09 NOTE — Progress Notes (Signed)
[name] [name], [name] [name] ([id number]) Visit Report for 09/08/2021 Biopsy Details Patient Name: Date of Service: [place], [name] 09/08/2021 1:15 PM Medical Record Number: [id number] Patient Account Number: [id number] Date of Birth/Sex: Treating RN: 1941/02/18 (81 y.o. Female) [name] [name] Primary Care Provider: [name] Other Clinician: Referring Provider: Treating Provider/Extender: Enis Slipper, PA T Weeks in Treatment: 0 Biopsy Performed for: Wound #2 Right, Posterior Lower Leg Location(s): Wound Bed Performed By: Physician Fredirick Maudlin, MD Tissue Punch: Yes Size (mm): 4 Number of Specimens T aken: 1 Specimen Sent T Pathology: o Yes Level of Consciousness (Pre-procedure): Awake and Alert Pre-procedure Verification/Time-Out Taken: Yes - 14:30 Pain Control: Lidocaine Injectable Lidocaine Percent: 1% Instrument: Blade, Forceps Bleeding: Moderate Hemostasis Achieved: Silver Nitrate Procedural Pain: 4 Post Procedural Pain: 2 Response to Treatment: Procedure was tolerated well Level of Consciousness (Post-procedure): Awake and Alert Post Procedure Diagnosis Same as Pre-procedure Electronic Signature(s) Signed: 09/08/2021 5:14:55 PM By: Fredirick Maudlin MD FACS Signed: 09/08/2021 5:54:42 PM By: [name] Hurst RN, BSN Entered By: [name] [name] on 09/08/2021 14:33:35 -------------------------------------------------------------------------------- Chief Complaint Document Details Patient Name: Date of Service: [place], [name] [name]. 09/08/2021 1:15 PM Medical Record Number: [id number] Patient Account Number: [id number] Date of Birth/Sex: Treating RN: October 13, 1940 (81 y.o. Female) Primary Care Provider: [name] Other Clinician: Referring Provider: Treating Provider/Extender: Enis Slipper, PA T Weeks in Treatment: 0 Information Obtained from: Patient Chief Complaint Patient seen for complaints of Non-Healing Wounds. Electronic Signature(s) Signed: 09/08/2021 2:44:47  PM By: Fredirick Maudlin MD FACS Entered By: Fredirick Maudlin on 09/08/2021 14:44:47 -------------------------------------------------------------------------------- HPI Details Patient Name: Date of Service: [place], [name] 09/08/2021 1:15 PM Medical Record Number: [id number] Patient Account Number: [id number] Date of Birth/Sex: Treating RN: 1940-10-20 (81 y.o. Female) Primary Care Provider: [name] Other Clinician: Referring Provider: Treating Provider/Extender: Enis Slipper, PA T Weeks in Treatment: 0 History of Present Illness HPI Description: ADMISSION 09/08/21 This is an 81 year old woman with a past medical history notable for peripheral arterial disease status post a number of revascularization interventions. She has hypertension and coronary artery disease, as well as stage III chronic kidney disease. She presents today for evaluation of several wounds on her bilateral lower extremities. All of the lesions seem to be associated with trauma in the setting of chronic venous insufficiency. There are 3 in total. On the left lateral calf, there is an area where the skin has come off and a similar lesion on the right. These were apparently caused by bumping into her shower chair on one side and her wheeled walker on the other. There is a more unusual looking lesion on her posterior right calf. It is irregular with irregular punched-out holes in the skin. There are areas of what look like old hematoma deep to this. It is quite tender. There is no odor nor any purulent drainage from any of the sites. She has home health care that comes out twice a week. They have been applying Xeroform, but there has been no significant improvement. She had ABIs done in September 2022 that were normal. She does have a history of prior endovascular interventions on both legs. Electronic Signature(s) Signed: 09/08/2021 3:49:51 PM By: Fredirick Maudlin MD FACS Previous Signature: 09/08/2021 3:38:20 PM  Version By: Fredirick Maudlin MD FACS Previous Signature: 09/08/2021 2:47:15 PM Version By: Fredirick Maudlin MD FACS Entered By: Fredirick Maudlin on 09/08/2021 15:49:50 -------------------------------------------------------------------------------- Physical Exam Details Patient Name: Date of Service: [place], [name] 09/08/2021 1:15 PM Medical Record Number: [id number]  Patient Account Number: [id number] Date of Birth/Sex: Treating RN: 1941-06-15 (81 y.o. Female) Primary Care Provider: [name] Other Clinician: Referring Provider: Treating Provider/Extender: Enis Slipper, PA T Weeks in Treatment: 0 Constitutional . . . . No acute distress. Respiratory Normal work of breathing on room air.. Cardiovascular Bilateral lower extremity nonpitting edema. Hemosiderin deposition and changes consistent with chronic venous stasis.. Notes 09/08/2021: Wound examon the right lateral leg there is a small lesion with no depth; there is a similar lesion on the left. No significant slough or exudate in either wound. On the posterior right leg, however, there is a very unusual appearing wound. It has almost a spongelike appearance with irregular punched-out areas in the skin. There is also what appears to be some hematoma deep to this. Electronic Signature(s) Signed: 09/08/2021 3:35:17 PM By: Fredirick Maudlin MD FACS Entered By: Fredirick Maudlin on 09/08/2021 15:35:16 -------------------------------------------------------------------------------- Physician Orders Details Patient Name: Date of Service: [place], [name] 09/08/2021 1:15 PM Medical Record Number: [id number] Patient Account Number: [id number] Date of Birth/Sex: Treating RN: [birth year] (81 y.o. Female) [name] [name] Primary Care Provider: [name] Other Clinician: Referring Provider: Treating Provider/Extender: Enis Slipper, PA T Weeks in Treatment: 0 Verbal / Phone Orders: No Diagnosis Coding ICD-10  Coding Code Description [phone] Chronic venous hypertension (idiopathic) with ulcer of bilateral lower extremity I73.9 Peripheral vascular disease, unspecified L97.221 Non-pressure chronic ulcer of left calf limited to breakdown of skin L97.211 Non-pressure chronic ulcer of right calf limited to breakdown of skin L97.212 Non-pressure chronic ulcer of right calf with fat layer exposed Follow-up Appointments ppointment in 1 week. - Dr. Celine Ahr Return A Bathing/ Shower/ Hygiene May shower with protection but do not get wound dressing(s) wet. - Ok to use Biomedical engineer, can purchase at [facility], [facility], or [employer] Edema Control - Lymphedema / SCD / Other Elevate legs to the level of the heart or above for 30 minutes daily and/or when sitting, a frequency of: - throughout the day Avoid standing for long periods of time. Exercise regularly [place] wound care orders this week; continue Home Health for wound care. May utilize formulary equivalent dressing for wound treatment orders unless otherwise specified. - Apply Polymem Silver to wound on right posterior lower leg (pt given extra), apply Calcium Alginate to wounds on right lateral and left anterior lower leg wounds, wrap both legs with 3 layer compression, change once a week on Monday or Tuesday, Other Home Health Orders/Instructions: - [facility] Wound Treatment Wound #1 - Lower Leg Wound Laterality: Left, Anterior Cleanser: Soap and Water 2 x Per Week/7 Days Discharge Instructions: May shower and wash wound with dial antibacterial soap and water prior to dressing change. Cleanser: Wound Cleanser 2 x Per Week/7 Days Discharge Instructions: Cleanse the wound with wound cleanser prior to applying a clean dressing using gauze sponges, not tissue or cotton balls. Peri-Wound Care: Sween Lotion (Moisturizing lotion) 2 x Per Week/7 Days Discharge Instructions: Apply moisturizing lotion as directed Prim Dressing: Maxorb Extra Calcium Alginate  2x2 in 2 x Per Week/7 Days ary Discharge Instructions: Apply calcium alginate to wound bed as instructed Secondary Dressing: Woven Gauze Sponge, Non-Sterile 4x4 in 2 x Per Week/7 Days Discharge Instructions: Apply over primary dressing as directed. Compression Wrap: ThreePress (3 layer compression wrap) 2 x Per Week/7 Days Discharge Instructions: Apply three layer compression as directed. Wound #2 - Lower Leg Wound Laterality: Right, Posterior Cleanser: Soap and Water 2 x Per Week/7 Days Discharge Instructions: May shower  and wash wound with dial antibacterial soap and water prior to dressing change. Cleanser: Wound Cleanser 2 x Per Week/7 Days Discharge Instructions: Cleanse the wound with wound cleanser prior to applying a clean dressing using gauze sponges, not tissue or cotton balls. Peri-Wound Care: Sween Lotion (Moisturizing lotion) 2 x Per Week/7 Days Discharge Instructions: Apply moisturizing lotion as directed Prim Dressing: PolyMem Silver Non-Adhesive Dressing, 4.25x4.25 in 2 x Per Week/7 Days ary Discharge Instructions: Apply to wound bed as instructed Secondary Dressing: Woven Gauze Sponge, Non-Sterile 4x4 in 2 x Per Week/7 Days Discharge Instructions: Apply over primary dressing as directed. Compression Wrap: ThreePress (3 layer compression wrap) 2 x Per Week/7 Days Discharge Instructions: Apply three layer compression as directed. Wound #3 - Lower Leg Wound Laterality: Right, Lateral Cleanser: Soap and Water 2 x Per Week/7 Days Discharge Instructions: May shower and wash wound with dial antibacterial soap and water prior to dressing change. Cleanser: Wound Cleanser 2 x Per Week/7 Days Discharge Instructions: Cleanse the wound with wound cleanser prior to applying a clean dressing using gauze sponges, not tissue or cotton balls. Peri-Wound Care: Sween Lotion (Moisturizing lotion) 2 x Per Week/7 Days Discharge Instructions: Apply moisturizing lotion as directed Prim Dressing:  Maxorb Extra Calcium Alginate 2x2 in 2 x Per Week/7 Days ary Discharge Instructions: Apply calcium alginate to wound bed as instructed Secondary Dressing: Woven Gauze Sponge, Non-Sterile 4x4 in 2 x Per Week/7 Days Discharge Instructions: Apply over primary dressing as directed. Compression Wrap: ThreePress (3 layer compression wrap) 2 x Per Week/7 Days Discharge Instructions: Apply three layer compression as directed. Laboratory naerobe culture (MICRO) - right posterior lower leg Bacteria identified in Unspecified specimen by A LOINC Code: 812-7 Convenience Name: Anaerobic culture Bacteria identified in Tissue by Biopsy culture (MICRO) - right posterior lower leg LOINC Code: [id number] Convenience Name: Biopsy specimen culture Patient Medications llergies: Lipitor A Notifications Medication Indication Start End 09/08/2021 doxycycline hyclate DOSE oral 100 mg tablet - 1 tab PO BID Electronic Signature(s) Signed: 09/08/2021 5:14:55 PM By: Fredirick Maudlin MD FACS Previous Signature: 09/08/2021 3:40:13 PM Version By: Fredirick Maudlin MD FACS Entered By: Fredirick Maudlin on 09/08/2021 15:50:35 -------------------------------------------------------------------------------- Problem List Details Patient Name: Date of Service: [place], [name] [name]. 09/08/2021 1:15 PM Medical Record Number: [id number] Patient Account Number: [id number] Date of Birth/Sex: Treating RN: [birth year] (81 y.o. Female) Primary Care Provider: [name] Other Clinician: Referring Provider: Treating Provider/Extender: Enis Slipper, PA T Weeks in Treatment: 0 Active Problems ICD-10 Encounter Code Description Active Date MDM Code Description Active Date MDM Diagnosis I87.313 Chronic venous hypertension (idiopathic) with ulcer of bilateral lower extremity 09/08/2021 No Yes I73.9 Peripheral vascular disease, unspecified 09/08/2021 No Yes L97.221 Non-pressure chronic ulcer of left calf limited to breakdown of skin  09/08/2021 No Yes L97.211 Non-pressure chronic ulcer of right calf limited to breakdown of skin 09/08/2021 No Yes L97.212 Non-pressure chronic ulcer of right calf with fat layer exposed 09/08/2021 No Yes Inactive Problems Resolved Problems Electronic Signature(s) Signed: 09/08/2021 2:42:41 PM By: Fredirick Maudlin MD FACS Entered By: Fredirick Maudlin on 09/08/2021 14:42:41 -------------------------------------------------------------------------------- Progress Note Details Patient Name: Date of Service: [place], [name] [name]. 09/08/2021 1:15 PM Medical Record Number: [id number] Patient Account Number: [id number] Date of Birth/Sex: Treating RN: [birth year] (81 y.o. Female) Primary Care Provider: [name] Other Clinician: Referring Provider: Treating Provider/Extender: Enis Slipper, PA T Weeks in Treatment: 0 Subjective Chief Complaint Information obtained from Patient Patient seen for complaints of Non-Healing Wounds. History of Present Illness (HPI)  ADMISSION 09/08/21 This is an 81 year old woman with a past medical history notable for peripheral arterial disease status post a number of revascularization interventions. She has hypertension and coronary artery disease, as well as stage III chronic kidney disease. She presents today for evaluation of several wounds on her bilateral lower extremities. All of the lesions seem to be associated with trauma in the setting of chronic venous insufficiency. There are 3 in total. On the left lateral calf, there is an area where the skin has come off and a similar lesion on the right. These were apparently caused by bumping into her shower chair on one side and her wheeled walker on the other. There is a more unusual looking lesion on her posterior right calf. It is irregular with irregular punched-out holes in the skin. There are areas of what look like old hematoma deep to this. It is quite tender. There is no odor nor any purulent drainage from any  of the sites. She has home health care that comes out twice a week. They have been applying Xeroform, but there has been no significant improvement. She had ABIs done in September 2022 that were normal. She does have a history of prior endovascular interventions on both legs. Patient History Information obtained from Patient. Allergies Lipitor (Severity: Severe, Reaction: anaphylaxis) Family History Heart Disease - Siblings, Hypertension - Siblings. Social History Former smoker - quit 11 years ago, Marital Status - Single, Alcohol Use - Never, Drug Use - No History, Caffeine Use - Rarely. Medical History Eyes Patient has history of Glaucoma - both eyes Cardiovascular Patient has history of Coronary Artery Disease, Hypertension, Peripheral Venous Disease Musculoskeletal Patient has history of Osteoarthritis Review of Systems (ROS) Constitutional Symptoms ([place]) Denies complaints or symptoms of Fatigue, Fever, Chills, Marked Weight Change. Ear/Nose/Mouth/Throat Denies complaints or symptoms of Chronic sinus problems or rhinitis. Respiratory Denies complaints or symptoms of Chronic or frequent coughs, Shortness of Breath. Gastrointestinal Denies complaints or symptoms of Frequent diarrhea, Nausea, Vomiting. Endocrine Denies complaints or symptoms of Heat/cold intolerance. Genitourinary Denies complaints or symptoms of Frequent urination. Integumentary (Skin) Complains or has symptoms of Wounds - wounds on bilateral lower legs. Neurologic Denies complaints or symptoms of Numbness/parasthesias. Psychiatric Denies complaints or symptoms of Claustrophobia, Suicidal. Objective Constitutional No acute distress. Vitals Time Taken: 1:40 PM, Height: 60 in, Source: Stated, Weight: 148 lbs, Source: Stated, BMI: 28.9, Temperature: 98.0 F, Pulse: 98 bpm, Respiratory Rate: 16 breaths/min, Blood Pressure: 131/78 mmHg. Respiratory Normal work of breathing on room  air.. Cardiovascular Bilateral lower extremity nonpitting edema. Hemosiderin deposition and changes consistent with chronic venous stasis.. General Notes: 09/08/2021: Wound examooon the right lateral leg there is a small lesion with no depth; there is a similar lesion on the left. No significant slough or exudate in either wound. On the posterior right leg, however, there is a very unusual appearing wound. It has almost a spongelike appearance with irregular punched-out areas in the skin. There is also what appears to be some hematoma deep to this. Integumentary (Hair, Skin) Wound #1 status is Open. Original cause of wound was Trauma. The date acquired was: 09/01/2021. The wound is located on the Left,Anterior Lower Leg. The wound measures 1.8cm length x 1.2cm width x 0.1cm depth; 1.696cm^2 area and 0.17cm^3 volume. There is Fat Layer (Subcutaneous Tissue) exposed. There is no tunneling or undermining noted. There is a medium amount of serosanguineous drainage noted. The wound margin is flat and intact. There is large (67- 100%) red, pink granulation  within the wound bed. There is no necrotic tissue within the wound bed. Wound #2 status is Open. Original cause of wound was Trauma. The date acquired was: 08/10/2021. The wound is located on the Right,Posterior Lower Leg. The wound measures 2cm length x 2.5cm width x 0.2cm depth; 3.927cm^2 area and 0.785cm^3 volume. There is Fat Layer (Subcutaneous Tissue) exposed. There is no tunneling or undermining noted. There is a medium amount of serosanguineous drainage noted. The wound margin is distinct with the outline attached to the wound base. There is large (67-100%) red, pink granulation within the wound bed. There is a small (1-33%) amount of necrotic tissue within the wound bed including Adherent Slough. Wound #3 status is Open. Original cause of wound was Trauma. The date acquired was: 08/10/2021. The wound is located on the Right,Lateral Lower Leg.  The wound measures 0.8cm length x 0.8cm width x 0.1cm depth; 0.503cm^2 area and 0.05cm^3 volume. There is Fat Layer (Subcutaneous Tissue) exposed. There is no tunneling or undermining noted. There is a medium amount of serosanguineous drainage noted. The wound margin is flat and intact. There is large (67- 100%) red, pink granulation within the wound bed. There is no necrotic tissue within the wound bed. Assessment Active Problems ICD-10 Chronic venous hypertension (idiopathic) with ulcer of bilateral lower extremity Peripheral vascular disease, unspecified Non-pressure chronic ulcer of left calf limited to breakdown of skin Non-pressure chronic ulcer of right calf limited to breakdown of skin Non-pressure chronic ulcer of right calf with fat layer exposed Procedures Wound #2 Pre-procedure diagnosis of Wound #2 is a Venous Leg Ulcer located on the Right, Posterior Lower Leg . There was a biopsy performed by Fredirick Maudlin, MD. There was a biopsy performed on Wound Bed. The skin was cleansed and prepped with anti-septic followed by pain control using Lidocaine Injectable: 1%. Utilizing a 4 mm tissue punch, tissue was removed at its base with the following instrument(s): Blade and Forceps and sent to pathology. A Moderate amount of bleeding was controlled with Silver Nitrate. A time out was conducted at 14:30, prior to the start of the procedure. The procedure was tolerated well with a pain level of 4 throughout and a pain level of 2 following the procedure. Post procedure Diagnosis Wound #2: Same as Pre-Procedure Pre-procedure diagnosis of Wound #2 is a Venous Leg Ulcer located on the Right,Posterior Lower Leg . There was a Three Layer Compression Therapy Procedure by [name] Hurst, RN. Post procedure Diagnosis Wound #2: Same as Pre-Procedure Wound #1 Pre-procedure diagnosis of Wound #1 is a Venous Leg Ulcer located on the Left,Anterior Lower Leg . There was a Three Layer Compression  Therapy Procedure by [name] Hurst, RN. Post procedure Diagnosis Wound #1: Same as Pre-Procedure Wound #3 Pre-procedure diagnosis of Wound #3 is a Venous Leg Ulcer located on the Right,Lateral Lower Leg . There was a Three Layer Compression Therapy Procedure by [name] Hurst, RN. Post procedure Diagnosis Wound #3: Same as Pre-Procedure Plan Follow-up Appointments: Return Appointment in 1 week. - Dr. Celine Ahr Bathing/ Shower/ Hygiene: May shower with protection but do not get wound dressing(s) wet. - Ok to use Biomedical engineer, can purchase at [facility], [facility], or [employer] Edema Control - Lymphedema / SCD / Other: Elevate legs to the level of the heart or above for 30 minutes daily and/or when sitting, a frequency of: - throughout the day Avoid standing for long periods of time. Exercise regularly Home Health: New wound care orders this week; continue Home Health for wound care. May  utilize formulary equivalent dressing for wound treatment orders unless otherwise specified. - Apply Polymem Silver to wound on right posterior lower leg (pt given extra), apply Calcium Alginate to wounds on right lateral and left anterior lower leg wounds, wrap both legs with 3 layer compression, change once a week on Monday or Tuesday, Other Home Health Orders/Instructions: - [facility] Laboratory ordered were: Anaerobic culture - right posterior lower leg, Biopsy specimen culture - right posterior lower leg The following medication(s) was prescribed: doxycycline hyclate oral 100 mg tablet 1 tab PO BID starting 09/08/2021 WOUND #1: - Lower Leg Wound Laterality: Left, Anterior Cleanser: Soap and Water 2 x Per Week/7 Days Discharge Instructions: May shower and wash wound with dial antibacterial soap and water prior to dressing change. Cleanser: Wound Cleanser 2 x Per Week/7 Days Discharge Instructions: Cleanse the wound with wound cleanser prior to applying a clean dressing using gauze sponges, not tissue or cotton  balls. Peri-Wound Care: Sween Lotion (Moisturizing lotion) 2 x Per Week/7 Days Discharge Instructions: Apply moisturizing lotion as directed Prim Dressing: Maxorb Extra Calcium Alginate 2x2 in 2 x Per Week/7 Days ary Discharge Instructions: Apply calcium alginate to wound bed as instructed Secondary Dressing: Woven Gauze Sponge, Non-Sterile 4x4 in 2 x Per Week/7 Days Discharge Instructions: Apply over primary dressing as directed. Com pression Wrap: ThreePress (3 layer compression wrap) 2 x Per Week/7 Days Discharge Instructions: Apply three layer compression as directed. WOUND #2: - Lower Leg Wound Laterality: Right, Posterior Cleanser: Soap and Water 2 x Per Week/7 Days Discharge Instructions: May shower and wash wound with dial antibacterial soap and water prior to dressing change. Cleanser: Wound Cleanser 2 x Per Week/7 Days Discharge Instructions: Cleanse the wound with wound cleanser prior to applying a clean dressing using gauze sponges, not tissue or cotton balls. Peri-Wound Care: Sween Lotion (Moisturizing lotion) 2 x Per Week/7 Days Discharge Instructions: Apply moisturizing lotion as directed Prim Dressing: PolyMem Silver Non-Adhesive Dressing, 4.25x4.25 in 2 x Per Week/7 Days ary Discharge Instructions: Apply to wound bed as instructed Secondary Dressing: Woven Gauze Sponge, Non-Sterile 4x4 in 2 x Per Week/7 Days Discharge Instructions: Apply over primary dressing as directed. Com pression Wrap: ThreePress (3 layer compression wrap) 2 x Per Week/7 Days Discharge Instructions: Apply three layer compression as directed. WOUND #3: - Lower Leg Wound Laterality: Right, Lateral Cleanser: Soap and Water 2 x Per Week/7 Days Discharge Instructions: May shower and wash wound with dial antibacterial soap and water prior to dressing change. Cleanser: Wound Cleanser 2 x Per Week/7 Days Discharge Instructions: Cleanse the wound with wound cleanser prior to applying a clean dressing using  gauze sponges, not tissue or cotton balls. Peri-Wound Care: Sween Lotion (Moisturizing lotion) 2 x Per Week/7 Days Discharge Instructions: Apply moisturizing lotion as directed Prim Dressing: Maxorb Extra Calcium Alginate 2x2 in 2 x Per Week/7 Days ary Discharge Instructions: Apply calcium alginate to wound bed as instructed Secondary Dressing: Woven Gauze Sponge, Non-Sterile 4x4 in 2 x Per Week/7 Days Discharge Instructions: Apply over primary dressing as directed. Compression Wrap: ThreePress (3 layer compression wrap) 2 x Per Week/7 Days Discharge Instructions: Apply three layer compression as directed. 09/08/2021: This is a new patient to our practice. She has venous insufficiency and peripheral arterial disease. She has had prior endovascular intervention and has normal ABIs at this time. She has several wounds on her lower extremities secondary to trauma in 2 cases. The third lesion is not entirely clear, as far as the etiology and it  does have a fairly unusual appearance. This is also exquisitely painful for her. I took a biopsy due to the unusual appearance as well as a culture secondary to her significant pain in this location. On the wounds on her lateral right and lateral left leg, we will use plain calcium alginate. We will use PolyMem silver on the unusual wound on her posterior right leg. Both legs will be placed in 3 layer compression. I will follow-up the results of her biopsy and culture, but in the meantime we will initiate doxycycline until more data are available. I will see her back in a week. Electronic Signature(s) Signed: 09/08/2021 5:14:10 PM By: Fredirick Maudlin MD FACS Entered By: Fredirick Maudlin on 09/08/2021 17:14:10 -------------------------------------------------------------------------------- HxROS Details Patient Name: Date of Service: Hornung, [name] 09/08/2021 1:15 PM Medical Record Number: [id number] Patient Account Number: [id number] Date of Birth/Sex: Treating  RN: 1940-10-14 (81 y.o. Female) [name] [name] Primary Care Provider: [name] Other Clinician: Referring Provider: Treating Provider/Extender: Enis Slipper, PA T Weeks in Treatment: 0 Information Obtained From Patient Constitutional Symptoms (General Health) Complaints and Symptoms: Negative for: Fatigue; Fever; Chills; Marked Weight Change Ear/Nose/Mouth/Throat Complaints and Symptoms: Negative for: Chronic sinus problems or rhinitis Respiratory Complaints and Symptoms: Negative for: Chronic or frequent coughs; Shortness of Breath Gastrointestinal Complaints and Symptoms: Negative for: Frequent diarrhea; Nausea; Vomiting Endocrine Complaints and Symptoms: Negative for: Heat/cold intolerance Genitourinary Complaints and Symptoms: Negative for: Frequent urination Integumentary (Skin) Complaints and Symptoms: Positive for: Wounds - wounds on bilateral lower legs Neurologic Complaints and Symptoms: Negative for: Numbness/parasthesias Psychiatric Complaints and Symptoms: Negative for: Claustrophobia; Suicidal Eyes Medical History: Positive for: Glaucoma - both eyes Hematologic/Lymphatic Cardiovascular Medical History: Positive for: Coronary Artery Disease; Hypertension; Peripheral Venous Disease Immunological Musculoskeletal Medical History: Positive for: Osteoarthritis Oncologic HBO Extended History Items Eyes: Glaucoma Immunizations Pneumococcal Vaccine: Received Pneumococcal Vaccination: Yes Received Pneumococcal Vaccination On or After 60th Birthday: Yes Implantable Devices None Family and Social History Heart Disease: Yes - Siblings; Hypertension: Yes - Siblings; Former smoker - quit 11 years ago; Marital Status - Single; Alcohol Use: Never; Drug Use: No History; Caffeine Use: Rarely; Financial Concerns: No; Food, Clothing or Shelter Needs: No; Support System Lacking: No; Transportation Concerns: No Engineer, maintenance) Signed:  09/08/2021 5:14:55 PM By: Fredirick Maudlin MD FACS Signed: 09/08/2021 5:54:42 PM By: [name] Hurst RN, BSN Entered By: [name] [name] on 09/08/2021 14:05:45 -------------------------------------------------------------------------------- Richardson Details Patient Name: Date of Service: [place], [name] 09/08/2021 Medical Record Number: [id number] Patient Account Number: [id number] Date of Birth/Sex: Treating RN: 1941/02/05 (81 y.o. 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 calf with fat layer exposed Facility Procedures CPT4 Code: 35686168 Description: 99213 - WOUND CARE VISIT-LEV 3 EST PT Modifier: 25 Quantity: 1 CPT4 Code: [id number] Description: 11104-Punch biopsy of skin (including simple closure, when performed) single lesion ICD-10 Diagnosis Description L97.212 Non-pressure chronic ulcer of right calf with fat layer exposed Modifier: Quantity: 1 Physician Procedures : CPT4 Code Description Modifier [id number] - WC PHYS LEVEL 5 - NEW PT ICD-10 Diagnosis Description L97.221 Non-pressure chronic ulcer of left calf limited to breakdown of  skin L97.211 Non-pressure chronic ulcer of right calf limited to breakdown of  skin L97.212 Non-pressure chronic ulcer of right calf with fat layer exposed Quantity: 1 : 11104 Punch biopsy of skin (including simple closure, when performed) single lesion ICD-10 Diagnosis Description L97.212 Non-pressure chronic ulcer of right calf with fat layer  exposed Quantity: 1 Electronic Signature(s) Signed: 09/08/2021 5:54:42 PM By: [name] Hurst RN, BSN Signed: 09/09/2021 7:49:41 AM By: Fredirick Maudlin MD FACS Previous Signature: 09/08/2021 5:14:37 PM Version By: Fredirick Maudlin MD FACS Entered By: [name] [name] on 09/08/2021 17:44:45

## 2021-09-11 LAB — AEROBIC CULTURE W GRAM STAIN (SUPERFICIAL SPECIMEN): Gram Stain: NONE SEEN

## 2021-09-15 ENCOUNTER — Encounter (HOSPITAL_BASED_OUTPATIENT_CLINIC_OR_DEPARTMENT_OTHER): Payer: Medicare HMO | Admitting: General Surgery

## 2021-09-15 ENCOUNTER — Other Ambulatory Visit: Payer: Self-pay

## 2021-09-15 DIAGNOSIS — I87313 Chronic venous hypertension (idiopathic) with ulcer of bilateral lower extremity: Secondary | ICD-10-CM | POA: Diagnosis not present

## 2021-09-15 NOTE — Progress Notes (Addendum)
[name] [name], [name] [name] ([id number]) Visit Report for 09/15/2021 Arrival Information Details Patient Name: Date of Service: [place], [name] 09/15/2021 2:15 PM Medical Record Number: [id number] Patient Account Number: [id number] Date of Birth/Sex: Treating RN: [birth year] (81 y.o. Tonita Phoenix, Lauren Primary Care Faten Frieson: [name] Other Clinician: Referring Yeshaya Vath: Treating Vernard Gram/Extender: Lum Keas in Treatment: 1 Visit Information History Since Last Visit Added or deleted any medications: No Patient Arrived: [name] Any new allergies or adverse reactions: No Arrival Time: 14:37 Had a fall or experienced change in No Accompanied By: self activities of daily living that may affect Transfer Assistance: Manual risk of falls: Patient Identification Verified: Yes Signs or symptoms of abuse/neglect since last visito No Secondary Verification Process Completed: Yes Hospitalized since last visit: No Patient Has Alerts: Yes Implantable device outside of the clinic excluding No Patient Alerts: R ABI: 1.0 TBI: 0.76 cellular tissue based products placed in the center L ABI: 0.97 TBI: 0.57 since last visit: Has Dressing in Place as Prescribed: Yes Pain Present Now: Yes Electronic Signature(s) Signed: 09/15/2021 5:18:04 PM By: Rhae Hammock RN Entered By: Rhae Hammock on 09/15/2021 14:38:25 -------------------------------------------------------------------------------- Compression Therapy Details Patient Name: Date of Service: [name] [name], [name] 09/15/2021 2:15 PM Medical Record Number: [id number] Patient Account Number: [id number] Date of Birth/Sex: Treating RN: September 21, 1940 (80 y.o. Tonita Phoenix, Lauren Primary Care Talana Slatten: [name] Other Clinician: Referring Daviyon Widmayer: Treating Selma Mink/Extender: Lum Keas in Treatment: 1 Compression Therapy Performed for Wound Assessment: Wound #2 Right,Posterior Lower Leg Performed By:  Clinician Rhae Hammock, RN Compression Type: Three Layer Post Procedure Diagnosis Same as Pre-procedure Notes BLE Electronic Signature(s) Signed: 09/15/2021 5:18:04 PM By: Rhae Hammock RN Entered By: Rhae Hammock on 09/15/2021 15:22:58 -------------------------------------------------------------------------------- Encounter Discharge Information Details Patient Name: Date of Service: [name] [name], [name] 09/15/2021 2:15 PM Medical Record Number: [id number] Patient Account Number: [id number] Date of Birth/Sex: Treating RN: 12/25/1940 (81 y.o. Tonita Phoenix, Lauren Primary Care Yajaira Doffing: [name] Other Clinician: Referring Jaycob Mcclenton: Treating Lisandra Mathisen/Extender: Lum Keas in Treatment: 1 Encounter Discharge Information Items Discharge Condition: Stable Ambulatory Status: Walker Discharge Destination: Home Transportation: Private Auto Accompanied By: self Schedule Follow-up Appointment: Yes Clinical Summary of Care: Patient Declined Electronic Signature(s) Signed: 09/15/2021 5:18:04 PM By: Rhae Hammock RN Entered By: Rhae Hammock on 09/15/2021 15:59:12 -------------------------------------------------------------------------------- Lower Extremity Assessment Details Patient Name: Date of Service: [name] [name], [name] 09/15/2021 2:15 PM Medical Record Number: [id number] Patient Account Number: [id number] Date of Birth/Sex: Treating RN: August 06, 1940 (80 y.o. Tonita Phoenix, Lauren Primary Care Meenakshi Sazama: [name] Other Clinician: Referring Bayla Mcgovern: Treating Brandyce Dimario/Extender: Lum Keas in Treatment: 1 Edema Assessment Assessed: Shirlyn Goltz: Yes] Patrice Paradise: Yes] Edema: [Left: Yes] [Right: Yes] Calf Left: Right: Point of Measurement: 8 cm From Medial Instep 35.5 cm 35 cm Ankle Left: Right: Point of Measurement: 27 cm From Medial Instep 24.5 cm 24.5 cm Vascular Assessment Pulses: Dorsalis Pedis Palpable:  [Left:Yes] [Right:Yes] Posterior Tibial Palpable: [Left:Yes] [Right:Yes] Electronic Signature(s) Signed: 09/15/2021 5:18:04 PM By: Rhae Hammock RN Entered By: Rhae Hammock on 09/15/2021 14:44:31 -------------------------------------------------------------------------------- Multi Wound Chart Details Patient Name: Date of Service: [name] [name], [name] 09/15/2021 2:15 PM Medical Record Number: [id number] Patient Account Number: [id number] Date of Birth/Sex: Treating RN: Jan 18, 1941 (80 y.o. F) Primary Care Deaisa Merida: [name] Other Clinician: Referring Kimberle Stanfill: Treating Guliana Weyandt/Extender: Lum Keas in Treatment: 1 Vital Signs Height(in): 60 Pulse(bpm): 84 Weight(lbs): 148 Blood Pressure(mmHg): 146/84 Body Mass Index(BMI): 28.9 Temperature(F): 97.5 Respiratory Rate(breaths/min): 17 Photos:  Left, Anterior Lower Leg Right, Posterior Lower Leg Right, Lateral Lower Leg Wound Location: Trauma Trauma Trauma Wounding Event: Venous Leg Ulcer Venous Leg Ulcer Venous Leg Ulcer Primary Etiology: Glaucoma, Coronary Artery Disease, Glaucoma, Coronary Artery Disease, Glaucoma, Coronary Artery Disease, Comorbid History: Hypertension, Peripheral Venous Hypertension, Peripheral Venous Hypertension, Peripheral Venous Disease, Osteoarthritis Disease, Osteoarthritis Disease, Osteoarthritis 09/01/2021 08/10/2021 08/10/2021 Date Acquired: '1 1 1 '$ Weeks of Treatment: Healed - Epithelialized Open Healed - Epithelialized Wound Status: No No No Wound Recurrence: 0x0x0 2x2x0.2 0x0x0 Measurements L x W x D (cm) 0 3.142 0 A (cm) : rea 0 0.628 0 Volume (cm) : 100.00% 20.00% 100.00% % Reduction in Area: 100.00% 20.00% 100.00% % Reduction in Volume: Full Thickness Without Exposed Full Thickness Without Exposed Full Thickness Without Exposed Classification: Support Structures Support Structures Support Structures Medium Medium Medium Exudate  Amount: Serosanguineous Serosanguineous Serosanguineous Exudate Type: red, brown red, brown red, brown Exudate Color: Flat and Intact Distinct, outline attached Flat and Intact Wound Margin: Large (67-100%) Large (67-100%) Large (67-100%) Granulation Amount: Red, Pink Red, Pink Red, Pink Granulation Quality: None Present (0%) Small (1-33%) None Present (0%) Necrotic Amount: Fat Layer (Subcutaneous Tissue): Yes Fat Layer (Subcutaneous Tissue): Yes Fat Layer (Subcutaneous Tissue): Yes Exposed Structures: Fascia: No Fascia: No Fascia: No Tendon: No Tendon: No Tendon: No Muscle: No Muscle: No Muscle: No Joint: No Joint: No Joint: No Bone: No Bone: No Bone: No None None None Epithelialization: N/A Compression Therapy N/A Procedures Performed: Treatment Notes Wound #1 (Lower Leg) Wound Laterality: Left, Anterior Cleanser Peri-Wound Care Topical Primary Dressing Secondary Dressing Secured With Compression Wrap Compression Stockings Add-Ons Wound #2 (Lower Leg) Wound Laterality: Right, Posterior Cleanser Soap and Water Discharge Instruction: May shower and wash wound with dial antibacterial soap and water prior to dressing change. Wound Cleanser Discharge Instruction: Cleanse the wound with wound cleanser prior to applying a clean dressing using gauze sponges, not tissue or cotton balls. Peri-Wound Care Sween Lotion (Moisturizing lotion) Discharge Instruction: Apply moisturizing lotion as directed Topical Primary Dressing PolyMem Silver Non-Adhesive Dressing, 4.25x4.25 in Discharge Instruction: Apply to wound bed as instructed Secondary Dressing Woven Gauze Sponge, Non-Sterile 4x4 in Discharge Instruction: Apply over primary dressing as directed. Secured With Compression Wrap ThreePress (3 layer compression wrap) Discharge Instruction: Apply three layer compression as directed. Compression Stockings Add-Ons Wound #3 (Lower Leg) Wound Laterality: Right,  Lateral Cleanser Peri-Wound Care Topical Primary Dressing Secondary Dressing Secured With Compression Wrap Compression Stockings Add-Ons Electronic Signature(s) Signed: 09/16/2021 7:37:28 AM By: Fredirick Maudlin MD FACS Previous Signature: 09/15/2021 3:34:04 PM Version By: Fredirick Maudlin MD FACS Entered By: Fredirick Maudlin on 09/16/2021 07:37:28 -------------------------------------------------------------------------------- Multi-Disciplinary Care Plan Details Patient Name: Date of Service: [place], [name] 09/15/2021 2:15 PM Medical Record Number: [id number] Patient Account Number: [id number] Date of Birth/Sex: Treating RN: 01-27-1941 (81 y.o. Tonita Phoenix, Lauren Primary Care Robi Mitter: Other Clinician: [name] Referring Jewelz Ricklefs: Treating Licet Dunphy/Extender: Lum Keas in Treatment: 1 Multidisciplinary Care Plan reviewed with physician Active Inactive Abuse / Safety / Falls / Self Care Management Nursing Diagnoses: Potential for falls Potential for injury related to falls Goals: Patient will not experience any injury related to falls Date Initiated: 09/08/2021 Target Resolution Date: 10/07/2021 Goal Status: Active Patient/caregiver will verbalize/demonstrate measures taken to improve the patient's personal safety Date Initiated: 09/08/2021 Target Resolution Date: 10/07/2021 Goal Status: Active Patient/caregiver will verbalize/demonstrate measures taken to prevent injury and/or falls Date Initiated: 09/08/2021 Target Resolution Date: 10/07/2021 Goal Status: Active Interventions: Assess Activities of Daily Living  upon admission and as needed Assess fall risk on admission and as needed Assess: immobility, friction, shearing, incontinence upon admission and as needed Assess impairment of mobility on admission and as needed per policy Assess personal safety and home safety (as indicated) on admission and as needed Assess self care needs on  admission and as needed Provide education on fall prevention Provide education on personal and home safety Notes: Wound/Skin Impairment Nursing Diagnoses: Impaired tissue integrity Knowledge deficit related to ulceration/compromised skin integrity Goals: Patient/caregiver will verbalize understanding of skin care regimen Date Initiated: 09/08/2021 Target Resolution Date: 10/07/2021 Goal Status: Active Ulcer/skin breakdown will have a volume reduction of 30% by week 4 Date Initiated: 09/08/2021 Target Resolution Date: 10/07/2021 Goal Status: Active Interventions: Assess patient/caregiver ability to obtain necessary supplies Assess patient/caregiver ability to perform ulcer/skin care regimen upon admission and as needed Assess ulceration(s) every visit Provide education on ulcer and skin care Notes: Electronic Signature(s) Signed: 09/15/2021 5:18:04 PM By: Rhae Hammock RN Entered By: Rhae Hammock on 09/15/2021 15:30:04 -------------------------------------------------------------------------------- Pain Assessment Details Patient Name: Date of Service: [name] [name], [name] 09/15/2021 2:15 PM Medical Record Number: [id number] Patient Account Number: [id number] Date of Birth/Sex: Treating RN: 10-19-1940 (81 y.o. Tonita Phoenix, Lauren Primary Care Liv Rallis: [name] Other Clinician: Referring Lindalou Soltis: Treating Cope Marte/Extender: Lum Keas in Treatment: 1 Active Problems Location of Pain Severity and Description of Pain Patient Has Paino Yes Site Locations Pain Location: Generalized Pain, Pain in Ulcers With Dressing Change: Yes Duration of the Pain. Constant / Intermittento Constant Rate the pain. Current Pain Level: 7 Worst Pain Level: 10 Least Pain Level: 0 Tolerable Pain Level: 7 Character of Pain Describe the Pain: Aching Pain Management and Medication Current Pain Management: Medication: No Cold Application: No Rest: No Massage:  No Activity: No T.E.N.S.: No Heat Application: No Leg drop or elevation: No Is the Current Pain Management Adequate: Adequate How does your wound impact your activities of daily livingo Sleep: No Bathing: No Appetite: No Relationship With Others: No Bladder Continence: No Emotions: No Bowel Continence: No Work: No Toileting: No Drive: No Dressing: No Hobbies: No Electronic Signature(s) Signed: 09/15/2021 5:18:04 PM By: Rhae Hammock RN Entered By: Rhae Hammock on 09/15/2021 14:39:18 -------------------------------------------------------------------------------- Patient/Caregiver Education Details Patient Name: Date of Service: Kandis Cocking 3/9/2023andnbsp2:15 PM Medical Record Number: [id number] Patient Account Number: [id number] Date of Birth/Gender: Treating RN: 12/01/1940 (81 y.o. Tonita Phoenix, Lauren Primary Care Physician: [name] Other Clinician: Referring Physician: Treating Physician/Extender: Lum Keas in Treatment: 1 Education Assessment Education Provided To: Patient Education Topics Provided Safety: Methods: Explain/Verbal Responses: Reinforcements needed, State content correctly Electronic Signature(s) Signed: 09/15/2021 5:18:04 PM By: Rhae Hammock RN Entered By: Rhae Hammock on 09/15/2021 15:27:46 -------------------------------------------------------------------------------- Wound Assessment Details Patient Name: Date of Service: [name] [name], [name] 09/15/2021 2:15 PM Medical Record Number: [id number] Patient Account Number: [id number] Date of Birth/Sex: Treating RN: [birth year] (80 y.o. Tonita Phoenix, Lauren Primary Care Lashia Niese: [name] Other Clinician: Referring Aalyiah Camberos: Treating Cornella Emmer/Extender: Lum Keas in Treatment: 1 Wound Status Wound Number: 1 Primary Venous Leg Ulcer Etiology: Wound Location: Left, Anterior Lower Leg Wound Healed -  Epithelialized Wounding Event: Trauma Status: Date Acquired: 09/01/2021 Comorbid Glaucoma, Coronary Artery Disease, Hypertension, Peripheral Weeks Of Treatment: 1 History: Venous Disease, Osteoarthritis Clustered Wound: No Photos Wound Measurements Length: (cm) Width: (cm) Depth: (cm) Area: (cm) Volume: (cm) 0 % Reduction in Area: 100% 0 % Reduction in Volume: 100% 0 Epithelialization: None 0 0 Wound  Description Classification: Full Thickness Without Exposed Support Structures Wound Margin: Flat and Intact Exudate Amount: Medium Exudate Type: Serosanguineous Exudate Color: red, brown Foul Odor After Cleansing: No Slough/Fibrino No Wound Bed Granulation Amount: Large (67-100%) Exposed Structure Granulation Quality: Red, Pink Fascia Exposed: No Necrotic Amount: None Present (0%) Fat Layer (Subcutaneous Tissue) Exposed: Yes Tendon Exposed: No Muscle Exposed: No Joint Exposed: No Bone Exposed: No Treatment Notes Wound #1 (Lower Leg) Wound Laterality: Left, Anterior Cleanser Peri-Wound Care Topical Primary Dressing Secondary Dressing Secured With Compression Wrap Compression Stockings Add-Ons Electronic Signature(s) Signed: 09/15/2021 5:18:04 PM By: Rhae Hammock RN Entered By: Rhae Hammock on 09/15/2021 14:57:58 -------------------------------------------------------------------------------- Wound Assessment Details Patient Name: Date of Service: [name] [name], [name] 09/15/2021 2:15 PM Medical Record Number: [id number] Patient Account Number: [id number] Date of Birth/Sex: Treating RN: 1941/02/13 (81 y.o. Tonita Phoenix, Lauren Primary Care Myrla Malanowski: [name] Other Clinician: Referring Orie Baxendale: Treating Kyshaun Barnette/Extender: Lum Keas in Treatment: 1 Wound Status Wound Number: 2 Primary Venous Leg Ulcer Etiology: Wound Location: Right, Posterior Lower Leg Wound Open Wounding Event: Trauma Status: Date Acquired:  08/10/2021 Comorbid Glaucoma, Coronary Artery Disease, Hypertension, Peripheral Weeks Of Treatment: 1 History: Venous Disease, Osteoarthritis Clustered Wound: No Photos Wound Measurements Length: (cm) 2 Width: (cm) 2 Depth: (cm) 0.2 Area: (cm) 3.142 Volume: (cm) 0.628 % Reduction in Area: 20% % Reduction in Volume: 20% Epithelialization: None Tunneling: No Undermining: No Wound Description Classification: Full Thickness Without Exposed Support Structu Wound Margin: Distinct, outline attached Exudate Amount: Medium Exudate Type: Serosanguineous Exudate Color: red, brown res Foul Odor After Cleansing: No Slough/Fibrino Yes Wound Bed Granulation Amount: Large (67-100%) Exposed Structure Granulation Quality: Red, Pink Fascia Exposed: No Necrotic Amount: Small (1-33%) Fat Layer (Subcutaneous Tissue) Exposed: Yes Necrotic Quality: Adherent Slough Tendon Exposed: No Muscle Exposed: No Joint Exposed: No Bone Exposed: No Treatment Notes Wound #2 (Lower Leg) Wound Laterality: Right, Posterior Cleanser Soap and Water Discharge Instruction: May shower and wash wound with dial antibacterial soap and water prior to dressing change. Wound Cleanser Discharge Instruction: Cleanse the wound with wound cleanser prior to applying a clean dressing using gauze sponges, not tissue or cotton balls. Peri-Wound Care Sween Lotion (Moisturizing lotion) Discharge Instruction: Apply moisturizing lotion as directed Topical Primary Dressing PolyMem Silver Non-Adhesive Dressing, 4.25x4.25 in Discharge Instruction: Apply to wound bed as instructed Secondary Dressing Woven Gauze Sponge, Non-Sterile 4x4 in Discharge Instruction: Apply over primary dressing as directed. Secured With Compression Wrap ThreePress (3 layer compression wrap) Discharge Instruction: Apply three layer compression as directed. Compression Stockings Add-Ons Electronic Signature(s) Signed: 09/15/2021 5:18:04 PM By:  Rhae Hammock RN Entered By: Rhae Hammock on 09/15/2021 14:57:19 -------------------------------------------------------------------------------- Wound Assessment Details Patient Name: Date of Service: [name] [name], [name] 09/15/2021 2:15 PM Medical Record Number: [id number] Patient Account Number: [id number] Date of Birth/Sex: Treating RN: [birth year] (81 y.o. Tonita Phoenix, Lauren Primary Care Joe Tanney: [name] Other Clinician: Referring Anvitha Hutmacher: Treating Julieta Rogalski/Extender: Lum Keas in Treatment: 1 Wound Status Wound Number: 3 Primary Venous Leg Ulcer Etiology: Wound Location: Right, Lateral Lower Leg Wound Healed - Epithelialized Wounding Event: Trauma Status: Date Acquired: 08/10/2021 Comorbid Glaucoma, Coronary Artery Disease, Hypertension, Peripheral Weeks Of Treatment: 1 Weeks Of Treatment: 1 History: Venous Disease, Osteoarthritis Clustered Wound: No Photos Wound Measurements Length: (cm) Width: (cm) Depth: (cm) Area: (cm) Volume: (cm) 0 % Reduction in Area: 100% 0 % Reduction in Volume: 100% 0 Epithelialization: None 0 0 Wound Description Classification: Full Thickness Without Exposed Support Structu Wound Margin: Flat and  Intact Exudate Amount: Medium Exudate Type: Serosanguineous Exudate Color: red, brown res Foul Odor After Cleansing: No Slough/Fibrino No Wound Bed Granulation Amount: Large (67-100%) Exposed Structure Granulation Quality: Red, Pink Fascia Exposed: No Necrotic Amount: None Present (0%) Fat Layer (Subcutaneous Tissue) Exposed: Yes Tendon Exposed: No Muscle Exposed: No Joint Exposed: No Bone Exposed: No Treatment Notes Wound #3 (Lower Leg) Wound Laterality: Right, Lateral Cleanser Peri-Wound Care Topical Primary Dressing Secondary Dressing Secured With Compression Wrap Compression Stockings Add-Ons Electronic Signature(s) Signed: 09/15/2021 5:18:04 PM By: Rhae Hammock RN Entered By:  Rhae Hammock on 09/15/2021 14:58:41 -------------------------------------------------------------------------------- Vitals Details Patient Name: Date of Service: [name] [name], [name] 09/15/2021 2:15 PM Medical Record Number: [id number] Patient Account Number: [id number] Date of Birth/Sex: Treating RN: Sep 01, 1940 (81 y.o. Tonita Phoenix, Lauren Primary Care Nickolai Rinks: [name] Other Clinician: Referring Meaghann Choo: Treating Ellenore Roscoe/Extender: Lum Keas in Treatment: 1 Vital Signs Time Taken: 14:38 Temperature (F): 97.5 Height (in): 60 Pulse (bpm): 84 Weight (lbs): 148 Respiratory Rate (breaths/min): 17 Body Mass Index (BMI): 28.9 Blood Pressure (mmHg): 146/84 Reference Range: 80 - 120 mg / dl Electronic Signature(s) Signed: 09/15/2021 5:18:04 PM By: Rhae Hammock RN Entered By: Rhae Hammock on 09/15/2021 14:38:48

## 2021-09-16 NOTE — Progress Notes (Signed)
GESENIA, BANTZ ([id number]) Visit Report for 09/15/2021 Chief Complaint Document Details Patient Name: Date of Service: [place], [name] 09/15/2021 2:15 PM Medical Record Number: [id number] Patient Account Number: [id number] Date of Birth/Sex: Treating RN: 01/06/1941 (81 y.o. F) Primary Care Provider: [name] Other Clinician: Referring Provider: Treating Provider/Extender: Lum Keas in Treatment: 1 Information Obtained from: Patient Chief Complaint Patient seen for complaints of Non-Healing Wounds. Electronic Signature(s) Signed: 09/16/2021 7:37:37 AM By: Fredirick Maudlin MD FACS Previous Signature: 09/15/2021 3:34:13 PM Version By: Fredirick Maudlin MD FACS Entered By: Fredirick Maudlin on 09/16/2021 07:37:37 -------------------------------------------------------------------------------- HPI Details Patient Name: Date of Service: [name] [name]. 09/15/2021 2:15 PM Medical Record Number: [id number] Patient Account Number: [id number] Date of Birth/Sex: Treating RN: 1941-04-11 (81 y.o. F) Primary Care Provider: [name] Other Clinician: Referring Provider: Treating Provider/Extender: Lum Keas in Treatment: 1 History of Present Illness HPI Description: ADMISSION 09/08/21 This is an 81 year old woman with a past medical history notable for peripheral arterial disease status post a number of revascularization interventions. She has hypertension and coronary artery disease, as well as stage III chronic kidney disease. She presents today for evaluation of several wounds on her bilateral lower extremities. All of the lesions seem to be associated with trauma in the setting of chronic venous insufficiency. There are 3 in total. On the left lateral calf, there is an area where the skin has come off and a similar lesion on the right. These were apparently caused by bumping into her shower chair on one side and her wheeled walker on the other.  There is a more unusual looking lesion on her posterior right calf. It is irregular with irregular punched-out holes in the skin. There are areas of what look like old hematoma deep to this. It is quite tender. There is no odor nor any purulent drainage from any of the sites. She has home health care that comes out twice a week. They have been applying Xeroform, but there has been no significant improvement. She had ABIs done in September 2022 that were normal. She does have a history of prior endovascular interventions on both legs. 09/15/2021: All of the lesions on her left leg have healed. The culture taken from the right leg was positive for MRSA; I had empirically placed her on doxycycline last visit and the bacteria is sensitive. She has 3 more days of treatment. The biopsy from the right leg wound is still pending. She has been in PolyMem with 3 layer compression. She did state that she finds compression stockings difficult, because they tear her skin when she removes them. She would like to be put in compression on both legs, despite healing on the left. Electronic Signature(s) Signed: 09/15/2021 3:37:12 PM By: Fredirick Maudlin MD FACS Entered By: Fredirick Maudlin on 09/15/2021 15:37:12 -------------------------------------------------------------------------------- Physical Exam Details Patient Name: Date of Service: [name] [name] Bern. 09/15/2021 2:15 PM Medical Record Number: [id number] Patient Account Number: [id number] Date of Birth/Sex: Treating RN: 1940-12-14 (80 y.o. F) Primary Care Provider: [name] Other Clinician: Referring Provider: Treating Provider/Extender: Lum Keas in Treatment: 1 Constitutional . . . . No acute distress. Respiratory Normal work of breathing on room air. Notes 09/15/2021: Wound examthe superficial lesions on both legs have healed. The wound on the posterior right leg with the spongelike appearance is smaller, the intake nurse  said that she felt like she squeezed some purulent material from the site, but there was none present  on my evaluation. Electronic Signature(s) Signed: 09/15/2021 3:40:11 PM By: Fredirick Maudlin MD FACS Entered By: Fredirick Maudlin on 09/15/2021 15:40:11 -------------------------------------------------------------------------------- Physician Orders Details Patient Name: Date of Service: [name] [name] Bern. 09/15/2021 2:15 PM Medical Record Number: [id number] Patient Account Number: [id number] Date of Birth/Sex: Treating RN: 12-06-1940 (81 y.o. [name] Phoenix, Lauren Primary Care Provider: [name] Other Clinician: Referring Provider: Treating Provider/Extender: Lum Keas in Treatment: 1 Verbal / Phone Orders: No Diagnosis Coding Follow-up Appointments ppointment in 1 week. - Dr. Celine Ahr Return A Bathing/ Shower/ Hygiene May shower with protection but do not get wound dressing(s) wet. - Ok to use Biomedical engineer, can purchase at [facility], [facility], or [employer] Edema Control - Lymphedema / SCD / Other Elevate legs to the level of the heart or above for 30 minutes daily and/or when sitting, a frequency of: - throughout the day Avoid standing for long periods of time. Exercise regularly Other Edema Control Orders/Instructions: - 3 layer to left leg for edema control Home Health New wound care orders this week; continue Home Health for wound care. May utilize formulary equivalent dressing for wound treatment orders unless otherwise specified. - Apply Polymem Silver to wound on right posterior lower leg (pt given extra), apply Calcium Alginate to wounds on right lateral and left anterior lower leg wounds, wrap both legs with 3 layer compression, change once a week on Monday or Tuesday, Other Home Health Orders/Instructions: - [facility] Wound Treatment Wound #2 - Lower Leg Wound Laterality: Right, Posterior Cleanser: Soap and Water 2 x Per Week/7 Days Discharge Instructions:  May shower and wash wound with dial antibacterial soap and water prior to dressing change. Cleanser: Wound Cleanser 2 x Per Week/7 Days Discharge Instructions: Cleanse the wound with wound cleanser prior to applying a clean dressing using gauze sponges, not tissue or cotton balls. Peri-Wound Care: Sween Lotion (Moisturizing lotion) 2 x Per Week/7 Days Discharge Instructions: Apply moisturizing lotion as directed Prim Dressing: PolyMem Silver Non-Adhesive Dressing, 4.25x4.25 in 2 x Per Week/7 Days ary Discharge Instructions: Apply to wound bed as instructed Secondary Dressing: Woven Gauze Sponge, Non-Sterile 4x4 in 2 x Per Week/7 Days Discharge Instructions: Apply over primary dressing as directed. Compression Wrap: ThreePress (3 layer compression wrap) 2 x Per Week/7 Days Discharge Instructions: Apply three layer compression as directed. Electronic Signature(s) Signed: 09/16/2021 8:31:39 AM By: Fredirick Maudlin MD FACS Previous Signature: 09/15/2021 5:18:04 PM Version By: Rhae Hammock RN Entered By: Fredirick Maudlin on 09/16/2021 07:40:33 -------------------------------------------------------------------------------- Problem List Details Patient Name: Date of Service: [name] [name] Bern. 09/15/2021 2:15 PM Medical Record Number: [id number] Patient Account Number: [id number] Date of Birth/Sex: Treating RN: 1941/05/02 (81 y.o. F) Primary Care Provider: [name] Other Clinician: Referring Provider: Treating Provider/Extender: Lum Keas in Treatment: 1 Active Problems ICD-10 Encounter Code Description Active Date MDM Diagnosis I87.313 Chronic venous hypertension (idiopathic) with ulcer of bilateral lower extremity 09/08/2021 No Yes I73.9 Peripheral vascular disease, unspecified 09/08/2021 No Yes L97.221 Non-pressure chronic ulcer of left calf limited to breakdown of skin 09/08/2021 No Yes L97.211 Non-pressure chronic ulcer of right calf limited to breakdown of  skin 09/08/2021 No Yes L97.212 Non-pressure chronic ulcer of right calf with fat layer exposed 09/08/2021 No Yes Inactive Problems Resolved Problems Electronic Signature(s) Signed: 09/16/2021 7:37:08 AM By: Fredirick Maudlin MD FACS Previous Signature: 09/15/2021 3:33:51 PM Version By: Fredirick Maudlin MD FACS Entered By: Fredirick Maudlin on 09/16/2021 07:37:08 -------------------------------------------------------------------------------- Progress Note Details Patient Name: Date of Service: Zuluaga, [name]  RY L. 09/15/2021 2:15 PM Medical Record Number: [id number] Patient Account Number: [id number] Date of Birth/Sex: Treating RN: 01-Jul-1941 (81 y.o. F) Primary Care Provider: [name] Other Clinician: Referring Provider: Treating Provider/Extender: Lum Keas in Treatment: 1 Subjective Chief Complaint Information obtained from Patient Patient seen for complaints of Non-Healing Wounds. History of Present Illness (HPI) ADMISSION 09/08/21 This is an 81 year old woman with a past medical history notable for peripheral arterial disease status post a number of revascularization interventions. She has hypertension and coronary artery disease, as well as stage III chronic kidney disease. She presents today for evaluation of several wounds on her bilateral lower extremities. All of the lesions seem to be associated with trauma in the setting of chronic venous insufficiency. There are 3 in total. On the left lateral calf, there is an area where the skin has come off and a similar lesion on the right. These were apparently caused by bumping into her shower chair on one side and her wheeled walker on the other. There is a more unusual looking lesion on her posterior right calf. It is irregular with irregular punched-out holes in the skin. There are areas of what look like old hematoma deep to this. It is quite tender. There is no odor nor any purulent drainage from any of the  sites. She has home health care that comes out twice a week. They have been applying Xeroform, but there has been no significant improvement. She had ABIs done in September 2022 that were normal. She does have a history of prior endovascular interventions on both legs. 09/15/2021: All of the lesions on her left leg have healed. The culture taken from the right leg was positive for MRSA; I had empirically placed her on doxycycline last visit and the bacteria is sensitive. She has 3 more days of treatment. The biopsy from the right leg wound is still pending. She has been in PolyMem with 3 layer compression. She did state that she finds compression stockings difficult, because they tear her skin when she removes them. She would like to be put in compression on both legs, despite healing on the left. Patient History Information obtained from Patient. Family History Heart Disease - Siblings, Hypertension - Siblings. Social History Former smoker - quit 11 years ago, Marital Status - Single, Alcohol Use - Never, Drug Use - No History, Caffeine Use - Rarely. Medical History Eyes Patient has history of Glaucoma - both eyes Cardiovascular Patient has history of Coronary Artery Disease, Hypertension, Peripheral Venous Disease Musculoskeletal Patient has history of Osteoarthritis Objective Constitutional No acute distress. Vitals Time Taken: 2:38 PM, Height: 60 in, Weight: 148 lbs, BMI: 28.9, Temperature: 97.5 F, Pulse: 84 bpm, Respiratory Rate: 17 breaths/min, Blood Pressure: 146/84 mmHg. Respiratory Normal work of breathing on room air. General Notes: 09/15/2021: Wound examoothe superficial lesions on both legs have healed. The wound on the posterior right leg with the spongelike appearance is smaller, the intake nurse said that she felt like she squeezed some purulent material from the site, but there was none present on my evaluation. Integumentary (Hair, Skin) Wound #1 status is Healed -  Epithelialized. Original cause of wound was Trauma. The date acquired was: 09/01/2021. The wound has been in treatment 1 weeks. The wound is located on the Left,Anterior Lower Leg. The wound measures 0cm length x 0cm width x 0cm depth; 0cm^2 area and 0cm^3 volume. There is Fat Layer (Subcutaneous Tissue) exposed. There is a medium amount of serosanguineous drainage noted. The  wound margin is flat and intact. There is large (67- 100%) red, pink granulation within the wound bed. There is no necrotic tissue within the wound bed. Wound #2 status is Open. Original cause of wound was Trauma. The date acquired was: 08/10/2021. The wound has been in treatment 1 weeks. The wound is located on the Right,Posterior Lower Leg. The wound measures 2cm length x 2cm width x 0.2cm depth; 3.142cm^2 area and 0.628cm^3 volume. There is Fat Layer (Subcutaneous Tissue) exposed. There is no tunneling or undermining noted. There is a medium amount of serosanguineous drainage noted. The wound margin is distinct with the outline attached to the wound base. There is large (67-100%) red, pink granulation within the wound bed. There is a small (1-33%) amount of necrotic tissue within the wound bed including Adherent Slough. Wound #3 status is Healed - Epithelialized. Original cause of wound was Trauma. The date acquired was: 08/10/2021. The wound has been in treatment 1 weeks. The wound is located on the Right,Lateral Lower Leg. The wound measures 0cm length x 0cm width x 0cm depth; 0cm^2 area and 0cm^3 volume. There is Fat Layer (Subcutaneous Tissue) exposed. There is a medium amount of serosanguineous drainage noted. The wound margin is flat and intact. There is large (67- 100%) red, pink granulation within the wound bed. There is no necrotic tissue within the wound bed. Assessment Active Problems ICD-10 Chronic venous hypertension (idiopathic) with ulcer of bilateral lower extremity Peripheral vascular disease,  unspecified Non-pressure chronic ulcer of left calf limited to breakdown of skin Non-pressure chronic ulcer of right calf limited to breakdown of skin Non-pressure chronic ulcer of right calf with fat layer exposed Procedures Wound #2 Pre-procedure diagnosis of Wound #2 is a Venous Leg Ulcer located on the Right,Posterior Lower Leg . There was a Three Layer Compression Therapy Procedure by Rhae Hammock, RN. Post procedure Diagnosis Wound #2: Same as Pre-Procedure Notes: BLE. Plan Follow-up Appointments: Return Appointment in 1 week. - Dr. Celine Ahr Bathing/ Shower/ Hygiene: May shower with protection but do not get wound dressing(s) wet. - Ok to use Biomedical engineer, can purchase at [facility], [facility], or [employer] Edema Control - Lymphedema / SCD / Other: Elevate legs to the level of the heart or above for 30 minutes daily and/or when sitting, a frequency of: - throughout the day Avoid standing for long periods of time. Exercise regularly Other Edema Control Orders/Instructions: - 3 layer to left leg for edema control Home Health: New wound care orders this week; continue Home Health for wound care. May utilize formulary equivalent dressing for wound treatment orders unless otherwise specified. - Apply Polymem Silver to wound on right posterior lower leg (pt given extra), apply Calcium Alginate to wounds on right lateral and left anterior lower leg wounds, wrap both legs with 3 layer compression, change once a week on Monday or Tuesday, Other Home Health Orders/Instructions: - [facility] WOUND #2: - Lower Leg Wound Laterality: Right, Posterior Cleanser: Soap and Water 2 x Per Week/7 Days Discharge Instructions: May shower and wash wound with dial antibacterial soap and water prior to dressing change. Cleanser: Wound Cleanser 2 x Per Week/7 Days Discharge Instructions: Cleanse the wound with wound cleanser prior to applying a clean dressing using gauze sponges, not tissue or cotton  balls. Peri-Wound Care: Sween Lotion (Moisturizing lotion) 2 x Per Week/7 Days Discharge Instructions: Apply moisturizing lotion as directed Prim Dressing: PolyMem Silver Non-Adhesive Dressing, 4.25x4.25 in 2 x Per Week/7 Days ary Discharge Instructions: Apply to wound bed as  instructed Secondary Dressing: Woven Gauze Sponge, Non-Sterile 4x4 in 2 x Per Week/7 Days Discharge Instructions: Apply over primary dressing as directed. Com pression Wrap: ThreePress (3 layer compression wrap) 2 x Per Week/7 Days Discharge Instructions: Apply three layer compression as directed. 09/15/2021: The superficial wounds have healed. The more spongelike wound on her right medial calf has decreased in size. Culture was positive for MRSA. She is completing the course of doxycycline that I prescribed. At the time of her visit, the biopsy from the site was not available, but by the end of clinic, it had returned. It was negative for malignancy and the patient was contacted by phone to deliver this information. We will continue PolyMem to this wound and bilateral 3 layer compression wraps, as the patient finds compression stockings tear her skin. Follow-up in 1 week. Electronic Signature(s) Signed: 09/16/2021 7:41:51 AM By: Fredirick Maudlin MD FACS Signed: 09/16/2021 7:41:51 AM By: Fredirick Maudlin MD FACS Entered By: Fredirick Maudlin on 09/16/2021 07:41:50 -------------------------------------------------------------------------------- HxROS Details Patient Name: Date of Service: [name] [name] Edge. 09/15/2021 2:15 PM Medical Record Number: [id number] Patient Account Number: [id number] Date of Birth/Sex: Treating RN: 1941/05/17 (81 y.o. F) Primary Care Provider: [name] Other Clinician: Referring Provider: Treating Provider/Extender: Lum Keas in Treatment: 1 Information Obtained From Patient Eyes Medical History: Positive for: Glaucoma - both eyes Cardiovascular Medical  History: Positive for: Coronary Artery Disease; Hypertension; Peripheral Venous Disease Musculoskeletal Medical History: Positive for: Osteoarthritis HBO Extended History Items Eyes: Glaucoma Immunizations Pneumococcal Vaccine: Received Pneumococcal Vaccination: Yes Received Pneumococcal Vaccination On or After 60th Birthday: Yes Implantable Devices None Family and Social History Heart Disease: Yes - Siblings; Hypertension: Yes - Siblings; Former smoker - quit 11 years ago; Marital Status - Single; Alcohol Use: Never; Drug Use: No History; Caffeine Use: Rarely; Financial Concerns: No; Food, Clothing or Shelter Needs: No; Support System Lacking: No; Transportation Concerns: No Electronic Signature(s) Signed: 09/16/2021 8:31:39 AM By: Fredirick Maudlin MD FACS Entered By: Fredirick Maudlin on 09/16/2021 07:37:59 -------------------------------------------------------------------------------- SuperBill Details Patient Name: Date of Service: [name] [name] Bern 09/15/2021 Medical Record Number: [id number] Patient Account Number: [id number] Date of Birth/Sex: Treating RN: 1941/07/04 (81 y.o. [name] Phoenix, Lauren Primary Care Provider: [name] Other Clinician: Referring Provider: Treating Provider/Extender: Lum Keas in Treatment: 1 Diagnosis Coding ICD-10 Codes Code Description [id number] Chronic venous hypertension (idiopathic) with ulcer of bilateral lower extremity I73.9 Peripheral vascular disease, unspecified L97.221 Non-pressure chronic ulcer of left calf limited to breakdown of skin L97.211 Non-pressure chronic ulcer of right calf limited to breakdown of skin L97.212 Non-pressure chronic ulcer of right calf with fat layer exposed Facility Procedures CPT4: Code [id number] Description: 81 BILATERAL: Application of multi-layer venous compression system; leg (below knee), including ankle and t. Modifier: Quantity: 1 Physician Procedures : CPT4  Code Description Modifier [id number] - WC PHYS LEVEL 3 - EST PT ICD-10 Diagnosis Description N46.270 Non-pressure chronic ulcer of right calf with fat layer exposed Quantity: 1 Electronic Signature(s) Signed: 09/16/2021 7:42:08 AM By: Fredirick Maudlin MD FACS Previous Signature: 09/15/2021 5:18:04 PM Version By: Rhae Hammock RN Entered By: Fredirick Maudlin on 09/16/2021 07:42:08

## 2021-09-22 ENCOUNTER — Other Ambulatory Visit: Payer: Self-pay

## 2021-09-22 ENCOUNTER — Encounter (HOSPITAL_BASED_OUTPATIENT_CLINIC_OR_DEPARTMENT_OTHER): Payer: Medicare HMO | Admitting: General Surgery

## 2021-09-22 DIAGNOSIS — I87313 Chronic venous hypertension (idiopathic) with ulcer of bilateral lower extremity: Secondary | ICD-10-CM | POA: Diagnosis not present

## 2021-09-22 NOTE — Progress Notes (Signed)
Stoltz, Teya L. ([id number]) ?Visit Report for 09/22/2021 ?Chief Complaint Document Details ?Patient Name: Date of Service: ?Romito, [name] 09/22/2021 1:15 PM ?Medical Record Number: [id number] ?Patient Account Number: [id number] ?Date of Birth/Sex: Treating RN: ?08-19-1940 (81 y.o. F) ?Primary Care Provider: [name] Other Clinician: ?Referring Provider: ?Treating Provider/Extender: Fredirick Maudlin ?[name] ?Weeks in Treatment: 2 ?Information Obtained from: Patient ?Chief Complaint ?Patient seen for complaints of Non-Healing Wounds. ?Electronic Signature(s) ?Signed: 09/22/2021 1:51:18 PM By: Fredirick Maudlin MD FACS ?Entered By: Fredirick Maudlin on 09/22/2021 13:51:18 ?-------------------------------------------------------------------------------- ?Debridement Details ?Patient Name: Date of Service: ?Medearis, [name] 09/22/2021 1:15 PM ?Medical Record Number: [id number] ?Patient Account Number: [id number] ?Date of Birth/Sex: Treating RN: ?[birth year] (81 y.o. Benjamine Sprague, Shatara ?Primary Care Provider: [name] Other Clinician: ?Referring Provider: ?Treating Provider/Extender: Fredirick Maudlin ?[name] ?Weeks in Treatment: 2 ?Debridement Performed for Assessment: Wound #2 Right,Posterior Lower Leg ?Performed By: Clinician Levan Hurst, RN ?Debridement Type: Chemical/Enzymatic/Mechanical ?Agent Used: Wound cleanser and gauze to remove biofilm ?Severity of Tissue Pre Debridement: Fat layer exposed ?Level of Consciousness (Pre-procedure): Awake and Alert ?Pre-procedure Verification/Time Out Yes - 14:00 ?Taken: ?Start Time: 14:00 ?Bleeding: None ?End Time: 14:00 ?Procedural Pain: 0 ?Post Procedural Pain: 0 ?Response to Treatment: Procedure was tolerated well ?Level of Consciousness (Post- Awake and Alert ?procedure): ?Post Debridement Measurements of Total Wound ?Length: (cm) 0.1 ?Width: (cm) 0.1 ?Depth: (cm) 0.1 ?Volume: (cm?) 0.001 ?Character of Wound/Ulcer Post Debridement: Improved ?Severity of Tissue Post  Debridement: Fat layer exposed ?Post Procedure Diagnosis ?Same as Pre-procedure ?Electronic Signature(s) ?Signed: 09/22/2021 4:59:32 PM By: Fredirick Maudlin MD FACS ?Signed: 09/22/2021 6:25:59 PM By: Levan Hurst RN, BSN ?Entered By: Levan Hurst on 09/22/2021 14:06:55 ?-------------------------------------------------------------------------------- ?HPI Details ?Patient Name: Date of Service: ?Deatley, [name] 09/22/2021 1:15 PM ?Medical Record Number: [id number] ?Patient Account Number: [id number] ?Date of Birth/Sex: Treating RN: ?08-25-1940 (81 y.o. F) ?Primary Care Provider: [name] Other Clinician: ?Referring Provider: ?Treating Provider/Extender: Fredirick Maudlin ?[name] ?Weeks in Treatment: 2 ?History of Present Illness ?HPI Description: ADMISSION ?09/08/21 ?This is an 81 year old woman with a past medical history notable for peripheral arterial disease status post a number of revascularization interventions. She has ?hypertension and coronary artery disease, as well as stage III chronic kidney disease. She presents today for evaluation of several wounds on her bilateral ?lower extremities. All of the lesions seem to be associated with trauma in the setting of chronic venous insufficiency. There are 3 in total. On the left lateral ?calf, there is an area where the skin has come off and a similar lesion on the right. These were apparently caused by bumping into her shower chair on one side ?and her wheeled walker on the other. There is a more unusual looking lesion on her posterior right calf. It is irregular with irregular punched-out holes in the skin. ?There are areas of what look like old hematoma deep to this. It is quite tender. There is no odor nor any purulent drainage from any of the sites. ?She has home health care that comes out twice a week. They have been applying Xeroform, but there has been no significant improvement. She had ABIs ?done in September 2022 that were normal. She does have a  history of prior endovascular interventions on both legs. ?09/15/2021: All of the lesions on her left leg have healed. The culture taken from the right leg was positive for MRSA; I had empirically placed her on doxycycline ?last visit and the bacteria is sensitive. She has 3 more days  of treatment. The biopsy from the right leg wound is still pending. She has been in PolyMem with 3 ?layer compression. She did state that she finds compression stockings difficult, because they tear her skin when she removes them. She would like to be put ?in compression on both legs, despite healing on the left. ?09/22/2021: No new wounds have opened on her left leg. The biopsy from the right leg wound returned without any concern for malignancy. She completed her ?course of antibiotics. The right leg wound is minimally open; she says it is much less sensitive and there is little drainage. She is concerned about needing to ?use compression stockings due to the trauma to her skin when she tries to apply and remove them. ?Electronic Signature(s) ?Signed: 09/22/2021 1:53:43 PM By: Fredirick Maudlin MD FACS ?Entered By: Fredirick Maudlin on 09/22/2021 13:53:43 ?-------------------------------------------------------------------------------- ?Physical Exam Details ?Patient Name: Date of Service: ?Tegtmeyer, [name] 09/22/2021 1:15 PM ?Medical Record Number: [id number] ?Patient Account Number: [id number] ?Date of Birth/Sex: Treating RN: ?1940/12/21 (81 y.o. F) ?Primary Care Provider: [name] Other Clinician: ?Referring Provider: ?Treating Provider/Extender: Fredirick Maudlin ?[name] ?Weeks in Treatment: 2 ?Constitutional ?Within normal range for this patient.. Tachycardic, asymptomatic.. . . ?Respiratory ?Normal work of breathing on room air. ?Notes ?09/22/2021: Wound examno new lesions have opened up on the left leg. The wound on the posterior medial right leg has improved markedly. It is very nearly ?closed, with just a small opening at the  center. Good edema control with 3 layer compression. ?Electronic Signature(s) ?Signed: 09/22/2021 1:55:03 PM By: Fredirick Maudlin MD FACS ?Entered By: Fredirick Maudlin on 09/22/2021 13:55:02 ?-------------------------------------------------------------------------------- ?Physician Orders Details ?Patient Name: ?Date of Service: ?Cristina, [name] 09/22/2021 1:15 PM ?Medical Record Number: [id number] ?Patient Account Number: [id number] ?Date of Birth/Sex: ?Treating RN: ?[birth year] (81 y.o. Benjamine Sprague, Shatara ?Primary Care Provider: [name] ?Other Clinician: ?Referring Provider: ?Treating Provider/Extender: Fredirick Maudlin ?[name] ?Weeks in Treatment: 2 ?Verbal / Phone Orders: No ?Diagnosis Coding ?ICD-10 Coding ?Code Description ?I87.313 Chronic venous hypertension (idiopathic) with ulcer of bilateral lower extremity ?I73.9 Peripheral vascular disease, unspecified ?A21.308 Non-pressure chronic ulcer of left calf limited to breakdown of skin ?L97.211 Non-pressure chronic ulcer of right calf limited to breakdown of skin ?M57.846 Non-pressure chronic ulcer of right calf with fat layer exposed ?Follow-up Appointments ?ppointment in 1 week. - Dr. Celine Ahr ?Return A ?Bathing/ Shower/ Hygiene ?May shower with protection but do not get wound dressing(s) wet. - Ok to use Biomedical engineer, can purchase at [facility], [facility], or [place] ?Edema Control - Lymphedema / SCD / Other ?Elevate legs to the level of the heart or above for 30 minutes daily and/or when sitting, a frequency of: - throughout the day ?Avoid standing for long periods of time. ?Exercise regularly ?Other Edema Control Orders/Instructions: - 3 layer to left leg for edema control ?Additional Orders / Instructions ?Other: - Bring compression stockings with you to your next visit. ?Home Health ?No change in wound care orders this week; continue Home Health for wound care. May utilize formulary equivalent dressing for wound ?treatment orders unless otherwise  specified. - Continue to wrap both legs with 3 layer compression. Wound clinic will order Juxtalite for right leg. ?Other Home Health Orders/Instructions: - [facility] ?Wound Treatment ?Wound #2 - Lower Leg Wou

## 2021-09-22 NOTE — Progress Notes (Signed)
[name] [name], [name] L. ([id number]) ?Visit Report for 09/22/2021 ?Arrival Information Details ?Patient Name: Date of Service: ?[name], [name] 09/22/2021 1:15 PM ?Medical Record Number: [id number] ?Patient Account Number: [id number] ?Date of Birth/Sex: Treating RN: ?1940-07-19 (81 y.o. F) [name], [name] ?Primary Care Naasia Weilbacher: [name] Other Clinician: ?Referring Keymora Grillot: ?Treating Karson Reede/Extender: Fredirick Maudlin ?[name] ?Weeks in Treatment: 2 ?Visit Information History Since Last Visit ?Added or deleted any medications: No ?Patient Arrived: [name] ?Any new allergies or adverse reactions: No ?Arrival Time: 12:54 ?Had a fall or experienced change in No ?Accompanied By: self ?activities of daily living that may affect ?Transfer Assistance: None ?risk of falls: ?Patient Identification Verified: Yes ?Signs or symptoms of abuse/neglect since last visito No ?Patient Has Alerts: Yes ?Hospitalized since last visit: No ?Patient Alerts: ?R ABI: 1.0 TBI: 0.76 ?Implantable device outside of the clinic excluding No ?L ABI: 0.97 TBI: 0.57 ?cellular tissue based products placed in the center ?since last visit: ?Has Dressing in Place as Prescribed: Yes ?Pain Present Now: No ?Electronic Signature(s) ?Signed: 09/22/2021 5:52:33 PM By: Dellie Catholic RN ?Entered By: Dellie Catholic on 09/22/2021 13:02:26 ?-------------------------------------------------------------------------------- ?Compression Therapy Details ?Patient Name: Date of Service: ?[name], [name] 09/22/2021 1:15 PM ?Medical Record Number: [id number] ?Patient Account Number: [id number] ?Date of Birth/Sex: Treating RN: ?11/24/1940 (81 y.o. Benjamine Sprague, Shatara ?Primary Care Jalise Zawistowski: [name] Other Clinician: ?Referring Jayle Solarz: ?Treating Deannah Rossi/Extender: Fredirick Maudlin ?[name] ?Weeks in Treatment: 2 ?Compression Therapy Performed for Wound Assessment: Wound #2 Right,Posterior Lower Leg ?Performed By: Clinician Levan Hurst, RN ?Compression Type: Three  Layer ?Post Procedure Diagnosis ?Same as Pre-procedure ?Electronic Signature(s) ?Signed: 09/22/2021 6:25:59 PM By: Levan Hurst RN, BSN ?Entered By: Levan Hurst on 09/22/2021 13:48:48 ?-------------------------------------------------------------------------------- ?Encounter Discharge Information Details ?Patient Name: ?Date of Service: ?[name], [name] 09/22/2021 1:15 PM ?Medical Record Number: [id number] ?Patient Account Number: [id number] ?Date of Birth/Sex: ?Treating RN: ?1941/04/20 (81 y.o. F) [name], [name] ?Primary Care Sydnee Lamour: [name] ?Other Clinician: ?Referring Koleman Marling: ?Treating Chrysten Woulfe/Extender: Fredirick Maudlin ?[name] ?Weeks in Treatment: 2 ?Encounter Discharge Information Items Post Procedure Vitals ?Discharge Condition: Stable ?Temperature (F): 97.7 ?Ambulatory Status: Ambulatory ?Pulse (bpm): 116 ?Discharge Destination: Home ?Respiratory Rate (breaths/min): 18 ?Transportation: Private Auto ?Blood Pressure (mmHg): 91/64 ?Accompanied By: self ?Schedule Follow-up Appointment: Yes ?Clinical Summary of Care: Patient Declined ?Electronic Signature(s) ?Signed: 09/22/2021 5:52:33 PM By: Dellie Catholic RN ?Entered By: Dellie Catholic on 09/22/2021 17:52:03 ?-------------------------------------------------------------------------------- ?Lower Extremity Assessment Details ?Patient Name: ?Date of Service: ?[name], [name] 09/22/2021 1:15 PM ?Medical Record Number: [id number] ?Patient Account Number: [id number] ?Date of Birth/Sex: ?Treating RN: ?10/31/1940 (81 y.o. F) [name], [name] ?Primary Care Linden Mikes: [name] ?Other Clinician: ?Referring Nerissa Constantin: ?Treating Waller Marcussen/Extender: Fredirick Maudlin ?[name] ?Weeks in Treatment: 2 ?Edema Assessment ?Assessed: [Left: No] [Right: No] ?Edema: [Left: Yes] [Right: Yes] ?Calf ?Left: Right: ?Point of Measurement: 8 cm From Medial Instep 35.5 cm 34.6 cm ?Ankle ?Left: Right: ?Point of Measurement: 27 cm From Medial Instep 24.5 cm 24.2 cm ?Knee  To Floor ?Left: Right: ?From Medial Instep 35 cm 35 cm ?Vascular Assessment ?Pulses: ?Dorsalis Pedis ?Palpable: [Left:Yes] [Right:Yes] ?Electronic Signature(s) ?Signed: 09/22/2021 5:52:33 PM By: Dellie Catholic RN ?Signed: 09/22/2021 6:25:59 PM By: Levan Hurst RN, BSN ?Entered By: Levan Hurst on 09/22/2021 13:44:27 ?-------------------------------------------------------------------------------- ?Multi Wound Chart Details ?Patient Name: ?Date of Service: ?[name], [name] 09/22/2021 1:15 PM ?Medical Record Number: [id number] ?Patient Account Number: [id number] ?Date of Birth/Sex: ?Treating RN: ?[birth year] (81 y.o. F) ?Primary Care Jotham Ahn: [name] ?Other Clinician: ?Referring Leif Loflin: ?Treating Hawke Villalpando/Extender: Fredirick Maudlin ?  [name] ?Weeks in Treatment: 2 ?Vital Signs ?Height(in): 60 ?Pulse(bpm): 116 ?Weight(lbs): 148 ?Blood Pressure(mmHg): 91/64 ?Body Mass Index(BMI): 28.9 ?Temperature(??F): 97.7 ?Respiratory Rate(breaths/min): 18 ?Photos: [N/A:N/A] ?Right, Posterior Lower Leg N/A N/A ?Wound Location: ?Trauma N/A N/A ?Wounding Event: ?Venous Leg Ulcer N/A N/A ?Primary Etiology: ?Glaucoma, Coronary Artery Disease, N/A N/A ?Comorbid History: ?Hypertension, Peripheral Venous ?Disease, Osteoarthritis ?08/10/2021 N/A N/A ?Date Acquired: ?2 N/A N/A ?Weeks of Treatment: ?Open N/A N/A ?Wound Status: ?No N/A N/A ?Wound Recurrence: ?0.1x0.1x0.1 N/A N/A ?Measurements L x W x D (cm) ?0.008 N/A N/A ?A (cm?) : ?rea ?0.001 N/A N/A ?Volume (cm?) : ?99.80% N/A N/A ?% Reduction in Area: ?99.90% N/A N/A ?% Reduction in Volume: ?Full Thickness Without Exposed N/A N/A ?Classification: ?Support Structures ?Medium N/A N/A ?Exudate Amount: ?Serosanguineous N/A N/A ?Exudate Type: ?red, brown N/A N/A ?Exudate Color: ?Distinct, outline attached N/A N/A ?Wound Margin: ?None Present (0%) N/A N/A ?Granulation Amount: ?None Present (0%) N/A N/A ?Necrotic Amount: ?Fat Layer (Subcutaneous Tissue): Yes N/A N/A ?Exposed  Structures: ?Fascia: No ?Tendon: No ?Muscle: No ?Joint: No ?Bone: No ?Large (67-100%) N/A N/A ?Epithelialization: ?Compression Therapy N/A N/A ?Procedures Performed: ?Treatment Notes ?Electronic Signature(s) ?Signed: 09/22/2021 1:51:06 PM By: Fredirick Maudlin MD FACS ?Entered By: Fredirick Maudlin on 09/22/2021 13:51:06 ?-------------------------------------------------------------------------------- ?Multi-Disciplinary Care Plan Details ?Patient Name: ?Date of Service: ?Glowacki, [name] 09/22/2021 1:15 PM ?Medical Record Number: [id number] ?Patient Account Number: [id number] ?Date of Birth/Sex: ?Treating RN: ?08-Jul-1941 (81 y.o. F) [name], [name] ?Primary Care Aileen Amore: [name] ?Other Clinician: ?Referring Rizwan Kuyper: ?Treating Leesha Veno/Extender: Fredirick Maudlin ?[name] ?Weeks in Treatment: 2 ?Multidisciplinary Care Plan reviewed with physician ?Active Inactive ?Abuse / Safety / Falls / Self Care Management ?Nursing Diagnoses: ?Potential for falls ?Potential for injury related to falls ?Goals: ?Patient will not experience any injury related to falls ?Date Initiated: 09/08/2021 ?Target Resolution Date: 10/07/2021 ?Goal Status: Active ?Patient/caregiver will verbalize/demonstrate measures taken to improve the patient's personal safety ?Date Initiated: 09/08/2021 ?Target Resolution Date: 10/07/2021 ?Goal Status: Active ?Patient/caregiver will verbalize/demonstrate measures taken to prevent injury and/or falls ?Date Initiated: 09/08/2021 ?Target Resolution Date: 10/07/2021 ?Goal Status: Active ?Interventions: ?Assess Activities of Daily Living upon admission and as needed ?Assess fall risk on admission and as needed ?Assess: immobility, friction, shearing, incontinence upon admission and as needed ?Assess impairment of mobility on admission and as needed per policy ?Assess personal safety and home safety (as indicated) on admission and as needed ?Assess self care needs on admission and as needed ?Provide education on fall  prevention ?Provide education on personal and home safety ?Notes: ?Wound/Skin Impairment ?Nursing Diagnoses: ?Impaired tissue integrity ?Knowledge deficit related to ulceration/compromised skin integrity ?Goals: ?Patient

## 2021-09-27 ENCOUNTER — Ambulatory Visit: Payer: PPO | Admitting: Cardiovascular Disease

## 2021-09-29 ENCOUNTER — Other Ambulatory Visit: Payer: Self-pay

## 2021-09-29 ENCOUNTER — Encounter (HOSPITAL_BASED_OUTPATIENT_CLINIC_OR_DEPARTMENT_OTHER): Payer: Medicare HMO | Admitting: General Surgery

## 2021-09-29 DIAGNOSIS — I87313 Chronic venous hypertension (idiopathic) with ulcer of bilateral lower extremity: Secondary | ICD-10-CM | POA: Diagnosis not present

## 2021-09-30 NOTE — Progress Notes (Signed)
[name] [name], [name] L. ([id number]) ?Visit Report for 09/29/2021 ?Arrival Information Details ?Patient Name: Date of Service: ?[name], [name] 09/29/2021 3:00 PM ?Medical Record Number: [id number] ?Patient Account Number: [id number] ?Date of Birth/Sex: Treating RN: ?October 29, 1940 (81 y.o. F) [name] [name] ?Primary Care Jonaven Hilgers: [name] Other Clinician: ?Referring Anamaria Dusenbury: ?Treating Kanae Ignatowski/Extender: Fredirick Maudlin ?[name] ?Weeks in Treatment: 3 ?Visit Information History Since Last Visit ?Added or deleted any medications: No ?Patient Arrived: [name] [name] ?Any new allergies or adverse reactions: No ?Arrival Time: 15:07 ?Had a fall or experienced change in No ?Accompanied By: daughter ?activities of daily living that may affect ?Transfer Assistance: None ?risk of falls: ?Patient Identification Verified: Yes ?Signs or symptoms of abuse/neglect since last visito No ?Secondary Verification Process Completed: Yes ?Hospitalized since last visit: No ?Patient Has Alerts: Yes ?Implantable device outside of the clinic excluding No ?Patient Alerts: ?R ABI: 1.0 TBI: 0.76 ?cellular tissue based products placed in the center ?L ABI: 0.97 TBI: 0.57 ?since last visit: ?Has Dressing in Place as Prescribed: Yes ?Pain Present Now: Yes ?Electronic Signature(s) ?Signed: 09/30/2021 9:00:26 AM By: Sandre Kitty ?Entered By: Sandre Kitty on 09/29/2021 15:07:45 ?-------------------------------------------------------------------------------- ?Clinic Level of Care Assessment Details ?Patient Name: Date of Service: ?[name], [name] 09/29/2021 3:00 PM ?Medical Record Number: [id number] ?Patient Account Number: [id number] ?Date of Birth/Sex: Treating RN: ?[birth year] (81 y.o. F) [name] [name] ?Primary Care Rodel Glaspy: [name] Other Clinician: ?Referring Lindsey Demonte: ?Treating Joriel Streety/Extender: Fredirick Maudlin ?[name] ?Weeks in Treatment: 3 ?Clinic Level of Care Assessment Items ?TOOL 4 Quantity Score ?X- 1 0 ?Use when only an EandM is  performed on FOLLOW-UP visit ?ASSESSMENTS - Nursing Assessment / Reassessment ?X- 1 10 ?Reassessment of Co-morbidities (includes updates in patient status) ?X- 1 5 ?Reassessment of Adherence to Treatment Plan ?ASSESSMENTS - Wound and Skin A ssessment / Reassessment ?X - Simple Wound Assessment / Reassessment - one wound 1 5 ?'[]'$  - 0 ?Complex Wound Assessment / Reassessment - multiple wounds ?'[]'$  - 0 ?Dermatologic / Skin Assessment (not related to wound area) ?ASSESSMENTS - Focused Assessment ?'[]'$  - 0 ?Circumferential Edema Measurements - multi extremities ?'[]'$  - 0 ?Nutritional Assessment / Counseling / Intervention ?'[]'$  - 0 ?Lower Extremity Assessment (monofilament, tuning fork, pulses) ?'[]'$  - 0 ?Peripheral Arterial Disease Assessment (using hand held doppler) ?ASSESSMENTS - Ostomy and/or Continence Assessment and Care ?'[]'$  - 0 ?Incontinence Assessment and Management ?'[]'$  - 0 ?Ostomy Care Assessment and Management (repouching, etc.) ?PROCESS - Coordination of Care ?X - Simple Patient / Family Education for ongoing care 1 15 ?'[]'$  - 0 ?Complex (extensive) Patient / Family Education for ongoing care ?X- 1 10 ?Staff obtains Consents, Records, T Results / Process Orders ?est ?X- 1 10 ?Staff telephones HHA, Nursing Homes / Clarify orders / etc ?'[]'$  - 0 ?Routine Transfer to another Facility (non-emergent condition) ?'[]'$  - 0 ?Routine Hospital Admission (non-emergent condition) ?'[]'$  - 0 ?New Admissions / Biomedical engineer / Ordering NPWT Apligraf, etc. ?, ?'[]'$  - 0 ?Emergency Hospital Admission (emergent condition) ?X- 1 10 ?Simple Discharge Coordination ?'[]'$  - 0 ?Complex (extensive) Discharge Coordination ?PROCESS - Special Needs ?'[]'$  - 0 ?Pediatric / Minor Patient Management ?'[]'$  - 0 ?Isolation Patient Management ?'[]'$  - 0 ?Hearing / Language / Visual special needs ?'[]'$  - 0 ?Assessment of Community assistance (transportation, D/C planning, etc.) ?'[]'$  - 0 ?Additional assistance / Altered mentation ?'[]'$  - 0 ?Support Surface(s) Assessment  (bed, cushion, seat, etc.) ?INTERVENTIONS - Wound Cleansing / Measurement ?X - Simple Wound Cleansing - one wound 1 5 ?'[]'$  - 0 ?Complex  Wound Cleansing - multiple wounds ?X- 1 5 ?Wound Imaging (photographs - any number of wounds) ?'[]'$  - 0 ?Wound Tracing (instead of photographs) ?X- 1 5 ?Simple Wound Measurement - one wound ?'[]'$  - 0 ?Complex Wound Measurement - multiple wounds ?INTERVENTIONS - Wound Dressings ?X - Small Wound Dressing one or multiple wounds 1 10 ?'[]'$  - 0 ?Medium Wound Dressing one or multiple wounds ?'[]'$  - 0 ?Large Wound Dressing one or multiple wounds ?'[]'$  - 0 ?Application of Medications - topical ?'[]'$  - 0 ?Application of Medications - injection ?INTERVENTIONS - Miscellaneous ?'[]'$  - 0 ?External ear exam ?'[]'$  - 0 ?Specimen Collection (cultures, biopsies, blood, body fluids, etc.) ?'[]'$  - 0 ?Specimen(s) / Culture(s) sent or taken to Lab for analysis ?'[]'$  - 0 ?Patient Transfer (multiple staff / Civil Service fast streamer / Similar devices) ?'[]'$  - 0 ?Simple Staple / Suture removal (25 or less) ?'[]'$  - 0 ?Complex Staple / Suture removal (26 or more) ?'[]'$  - 0 ?Hypo / Hyperglycemic Management (close monitor of Blood Glucose) ?'[]'$  - 0 ?Ankle / Brachial Index (ABI) - do not check if billed separately ?X- 1 5 ?Vital Signs ?Has the patient been seen at the hospital within the last three years: Yes ?Total Score: 95 ?Level Of Care: New/Established - Level 3 ?Electronic Signature(s) ?Signed: 09/29/2021 6:18:03 PM By: Dellie Catholic RN ?Entered By: Dellie Catholic on 09/29/2021 18:16:28 ?-------------------------------------------------------------------------------- ?Encounter Discharge Information Details ?Patient Name: Date of Service: ?[name], [name] 09/29/2021 3:00 PM ?Medical Record Number: [id number] ?Patient Account Number: [id number] ?Date of Birth/Sex: Treating RN: ?08-28-1940 (81 y.o. F) [name] [name] ?Primary Care Nesta Scaturro: [name] Other Clinician: ?Referring Eldin Bonsell: ?Treating Perl Folmar/Extender: Fredirick Maudlin ?[name] ?Weeks in Treatment: 3 ?Encounter Discharge Information Items ?Discharge Condition: Stable ?Ambulatory Status: [name] [name] ?Discharge Destination: Home ?Transportation: Private Auto ?Accompanied By: daughter ?Schedule Follow-up Appointment: No ?Clinical Summary of Care: Patient Declined ?Electronic Signature(s) ?Signed: 09/29/2021 6:18:03 PM By: Dellie Catholic RN ?Entered By: Dellie Catholic on 09/29/2021 18:14:45 ?-------------------------------------------------------------------------------- ?Lower Extremity Assessment Details ?Patient Name: Date of Service: ?Brickley, [name] 09/29/2021 3:00 PM ?Medical Record Number: [id number] ?Patient Account Number: [id number] ?Date of Birth/Sex: Treating RN: ?[birth year] (81 y.o. F) [name] [name] ?Primary Care Mubashir Mallek: [name] Other Clinician: ?Referring Donavan Kerlin: ?Treating Makinzi Prieur/Extender: Fredirick Maudlin ?[name] ?Weeks in Treatment: 3 ?Edema Assessment ?Assessed: [Left: No] [Right: No] ?Edema: [Left: Yes] [Right: Yes] ?Calf ?Left: Right: ?Point of Measurement: 8 cm From Medial Instep 35.5 cm 34.6 cm ?Ankle ?Left: Right: ?Point of Measurement: 27 cm From Medial Instep 24.5 cm 24.2 cm ?Knee To Floor ?Left: Right: ?From Medial Instep 44 cm 44 cm ?Electronic Signature(s) ?Signed: 09/29/2021 6:18:03 PM By: Dellie Catholic RN ?Entered By: Dellie Catholic on 09/29/2021 15:18:07 ?-------------------------------------------------------------------------------- ?Multi Wound Chart Details ?Patient Name: ?Date of Service: ?Kilner, [name] 09/29/2021 3:00 PM ?Medical Record Number: [id number] ?Patient Account Number: [id number] ?Date of Birth/Sex: ?Treating RN: ?10/03/1940 (81 y.o. F) [name] [name] ?Primary Care Demetre Monaco: [name] ?Other Clinician: ?Referring Eryanna Regal: ?Treating Ethelyne Erich/Extender: Fredirick Maudlin ?[name] ?Weeks in Treatment: 3 ?Vital Signs ?Height(in): 60 ?Pulse(bpm): 95 ?Weight(lbs): 148 ?Blood Pressure(mmHg): 113/67 ?Body Mass Index(BMI):  28.9 ?Temperature(??F): 97.8 ?Respiratory Rate(breaths/min): 18 ?Photos: [N/A:N/A] ?Right, Posterior Lower Leg N/A N/A ?Wound Location: ?Trauma N/A N/A ?Wounding Event: ?Venous Leg Ulcer N/A N/A ?Primary Etiology: ?G

## 2021-09-30 NOTE — Progress Notes (Signed)
[name] [name], [name] L. ([id number]) ?Visit Report for 09/29/2021 ?Chief Complaint Document Details ?Patient Name: Date of Service: ?Kaiser, [name] 09/29/2021 3:00 PM ?Medical Record Number: [id number] ?Patient Account Number: [id number] ?Date of Birth/Sex: Treating RN: ?1941/06/19 (81 y.o. F) [name], [name] ?Primary Care Provider: [name] Other Clinician: ?Referring Provider: ?Treating Provider/Extender: Fredirick Maudlin ?[name] ?Weeks in Treatment: 3 ?Information Obtained from: Patient ?Chief Complaint ?Patient seen for complaints of Non-Healing Wounds. ?Electronic Signature(s) ?Signed: 09/29/2021 3:48:59 PM By: Fredirick Maudlin MD FACS ?Entered By: Fredirick Maudlin on 09/29/2021 15:48:59 ?-------------------------------------------------------------------------------- ?HPI Details ?Patient Name: Date of Service: ?[name], [name] 09/29/2021 3:00 PM ?Medical Record Number: [id number] ?Patient Account Number: [id number] ?Date of Birth/Sex: Treating RN: ?21-Jun-1941 (81 y.o. F) [name], [name] ?Primary Care Provider: [name] Other Clinician: ?Referring Provider: ?Treating Provider/Extender: Fredirick Maudlin ?[name] ?Weeks in Treatment: 3 ?History of Present Illness ?HPI Description: ADMISSION ?09/08/21 ?This is an 81 year old woman with a past medical history notable for peripheral arterial disease status post a number of revascularization interventions. She has ?hypertension and coronary artery disease, as well as stage III chronic kidney disease. She presents today for evaluation of several wounds on her bilateral ?lower extremities. All of the lesions seem to be associated with trauma in the setting of chronic venous insufficiency. There are 3 in total. On the left lateral ?calf, there is an area where the skin has come off and a similar lesion on the right. These were apparently caused by bumping into her shower chair on one side ?and her wheeled walker on the other. There is a more unusual looking lesion on  her posterior right calf. It is irregular with irregular punched-out holes in the skin. ?There are areas of what look like old hematoma deep to this. It is quite tender. There is no odor nor any purulent drainage from any of the sites. ?She has home health care that comes out twice a week. They have been applying Xeroform, but there has been no significant improvement. She had ABIs ?done in September 2022 that were normal. She does have a history of prior endovascular interventions on both legs. ?09/15/2021: All of the lesions on her left leg have healed. The culture taken from the right leg was positive for MRSA; I had empirically placed her on doxycycline ?last visit and the bacteria is sensitive. She has 3 more days of treatment. The biopsy from the right leg wound is still pending. She has been in PolyMem with 3 ?layer compression. She did state that she finds compression stockings difficult, because they tear her skin when she removes them. She would like to be put ?in compression on both legs, despite healing on the left. ?09/22/2021: No new wounds have opened on her left leg. The biopsy from the right leg wound returned without any concern for malignancy. She completed her ?course of antibiotics. The right leg wound is minimally open; she says it is much less sensitive and there is little drainage. She is concerned about needing to ?use compression stockings due to the trauma to her skin when she tries to apply and remove them. ?09/29/2021: Her wounds are healed. She has her juxta lite stocking with her today. ?Electronic Signature(s) ?Signed: 09/29/2021 3:49:45 PM By: Fredirick Maudlin MD FACS ?Entered By: Fredirick Maudlin on 09/29/2021 15:49:45 ?-------------------------------------------------------------------------------- ?Physical Exam Details ?Patient Name: Date of Service: ?[name], [name] 09/29/2021 3:00 PM ?Medical Record Number: [id number] ?Patient Account Number: [id number] ?Date of Birth/Sex: Treating  RN: ?[birth year] (81 y.o. F) [name],  [name] ?Primary Care Provider: [name] Other Clinician: ?Referring Provider: ?Treating Provider/Extender: Fredirick Maudlin ?[name] ?Weeks in Treatment: 3 ?Constitutional ?. . . . No acute distress. ?Respiratory ?Normal work of breathing on room air. ?Notes ?09/29/2021: Her wounds are all healed. ?Electronic Signature(s) ?Signed: 09/29/2021 3:50:29 PM By: Fredirick Maudlin MD FACS ?Entered By: Fredirick Maudlin on 09/29/2021 15:50:29 ?-------------------------------------------------------------------------------- ?Physician Orders Details ?Patient Name: Date of Service: ?[name], [name] 09/29/2021 3:00 PM ?Medical Record Number: [id number] ?Patient Account Number: [id number] ?Date of Birth/Sex: Treating RN: ?05-28-1941 (81 y.o. F) [name], [name] ?Primary Care Provider: [name] Other Clinician: ?Referring Provider: ?Treating Provider/Extender: Fredirick Maudlin ?[name] ?Weeks in Treatment: 3 ?Verbal / Phone Orders: No ?Diagnosis Coding ?ICD-10 Coding ?Code Description ?I87.313 Chronic venous hypertension (idiopathic) with ulcer of bilateral lower extremity ?I73.9 Peripheral vascular disease, unspecified ?P71.062 Non-pressure chronic ulcer of left calf limited to breakdown of skin ?L97.211 Non-pressure chronic ulcer of right calf limited to breakdown of skin ?I94.854 Non-pressure chronic ulcer of right calf with fat layer exposed ?Discharge From [facility] ?Discharge from [place] your wound is healed! ?Edema Control - Lymphedema / SCD / Other ?Elevate legs to the level of the heart or above for 30 minutes daily and/or when sitting, a frequency of: - throughout the day ?Avoid standing for long periods of time. ?Exercise regularly ?Moisturize legs daily. - Please Moisturize legs with a thick lotion -for example Aquaphor,Eucerin etc. ?Compression stocking or Garment 30-40 mm/Hg pressure to: - Please wear Juxtalite ?Additional Orders /  Instructions ?Other: - Bring compression stockings with you to your next visit-yes well done! ?Home Health ?No change in wound care orders this week; continue Home Health for wound care. May utilize formulary equivalent dressing for wound ?treatment orders unless otherwise specified. - Wound clinic will order Hilton for right leg-done ?Other Home Health Orders/Instructions: - [facility] ?Electronic Signature(s) ?Signed: 09/29/2021 3:54:04 PM By: Fredirick Maudlin MD FACS ?Entered By: Fredirick Maudlin on 09/29/2021 15:50:45 ?-------------------------------------------------------------------------------- ?Problem List Details ?Patient Name: ?Date of Service: ?Appleby, [name] 09/29/2021 3:00 PM ?Medical Record Number: [id number] ?Patient Account Number: [id number] ?Date of Birth/Sex: ?Treating RN: ?1941-02-04 (81 y.o. F) [name], [name] ?Primary Care Provider: [name] ?Other Clinician: ?Referring Provider: ?Treating Provider/Extender: Fredirick Maudlin ?[name] ?Weeks in Treatment: 3 ?Active Problems ?ICD-10 ?Encounter ?Code Description Active Date MDM ?Diagnosis ?I87.313 Chronic venous hypertension (idiopathic) with ulcer of bilateral lower extremity 09/08/2021 No Yes ?I73.9 Peripheral vascular disease, unspecified 09/08/2021 No Yes ?F81.829 Non-pressure chronic ulcer of left calf limited to breakdown of skin 09/08/2021 No Yes ?L97.211 Non-pressure chronic ulcer of right calf limited to breakdown of skin 09/08/2021 No Yes ?H37.169 Non-pressure chronic ulcer of right calf with fat layer exposed 09/08/2021 No Yes ?Inactive Problems ?Resolved Problems ?Electronic Signature(s) ?Signed: 09/29/2021 3:48:45 PM By: Fredirick Maudlin MD FACS ?Entered By: Fredirick Maudlin on 09/29/2021 15:48:45 ?-------------------------------------------------------------------------------- ?Progress Note Details ?Patient Name: ?Date of Service: ?Roemmich, [name] 09/29/2021 3:00 PM ?Medical Record Number: [id number] ?Patient Account Number:  [id number] ?Date of Birth/Sex: ?Treating RN: ?Jan 25, 1941 (81 y.o. F) [name], [name] ?Primary Care Provider: [name] ?Other Clinician: ?Referring Provider: ?Treating Provider/Extender: Fredirick Maudlin ?[name] ?[place]ella [place]

## 2021-10-07 ENCOUNTER — Ambulatory Visit: Payer: PPO | Admitting: Cardiovascular Disease

## 2021-10-12 ENCOUNTER — Encounter (HOSPITAL_BASED_OUTPATIENT_CLINIC_OR_DEPARTMENT_OTHER): Payer: Medicare HMO | Admitting: Internal Medicine

## 2022-03-24 ENCOUNTER — Ambulatory Visit (HOSPITAL_COMMUNITY)
Admission: RE | Admit: 2022-03-24 | Payer: Medicare HMO | Source: Ambulatory Visit | Attending: Cardiovascular Disease | Admitting: Cardiovascular Disease

## 2022-03-24 ENCOUNTER — Ambulatory Visit (HOSPITAL_COMMUNITY): Payer: Medicare HMO

## 2022-05-01 ENCOUNTER — Encounter (HOSPITAL_COMMUNITY): Payer: Self-pay | Admitting: *Deleted

## 2022-05-01 ENCOUNTER — Inpatient Hospital Stay (HOSPITAL_COMMUNITY)
Admission: EM | Admit: 2022-05-01 | Discharge: 2022-05-09 | DRG: 603 | Disposition: A | Discharge status: Skilled Nursing Facility | Payer: Medicare HMO | Attending: Internal Medicine | Admitting: Internal Medicine

## 2022-05-01 ENCOUNTER — Emergency Department (HOSPITAL_COMMUNITY): Payer: Medicare HMO

## 2022-05-01 ENCOUNTER — Other Ambulatory Visit: Payer: Self-pay

## 2022-05-01 DIAGNOSIS — N189 Chronic kidney disease, unspecified: Secondary | ICD-10-CM | POA: Diagnosis present

## 2022-05-01 DIAGNOSIS — L0390 Cellulitis, unspecified: Secondary | ICD-10-CM

## 2022-05-01 DIAGNOSIS — Z7982 Long term (current) use of aspirin: Secondary | ICD-10-CM

## 2022-05-01 DIAGNOSIS — Z90710 Acquired absence of both cervix and uterus: Secondary | ICD-10-CM

## 2022-05-01 DIAGNOSIS — Q600 Renal agenesis, unilateral: Secondary | ICD-10-CM | POA: Diagnosis not present

## 2022-05-01 DIAGNOSIS — F332 Major depressive disorder, recurrent severe without psychotic features: Secondary | ICD-10-CM | POA: Diagnosis present

## 2022-05-01 DIAGNOSIS — N179 Acute kidney failure, unspecified: Secondary | ICD-10-CM | POA: Diagnosis present

## 2022-05-01 DIAGNOSIS — F419 Anxiety disorder, unspecified: Secondary | ICD-10-CM | POA: Diagnosis present

## 2022-05-01 DIAGNOSIS — R262 Difficulty in walking, not elsewhere classified: Secondary | ICD-10-CM | POA: Diagnosis present

## 2022-05-01 DIAGNOSIS — Z602 Problems related to living alone: Secondary | ICD-10-CM | POA: Diagnosis present

## 2022-05-01 DIAGNOSIS — Z79899 Other long term (current) drug therapy: Secondary | ICD-10-CM

## 2022-05-01 DIAGNOSIS — L03314 Cellulitis of groin: Secondary | ICD-10-CM | POA: Diagnosis present

## 2022-05-01 DIAGNOSIS — M353 Polymyalgia rheumatica: Secondary | ICD-10-CM | POA: Diagnosis present

## 2022-05-01 DIAGNOSIS — F32A Depression, unspecified: Secondary | ICD-10-CM | POA: Diagnosis present

## 2022-05-01 DIAGNOSIS — M25551 Pain in right hip: Secondary | ICD-10-CM

## 2022-05-01 DIAGNOSIS — Z888 Allergy status to other drugs, medicaments and biological substances status: Secondary | ICD-10-CM

## 2022-05-01 DIAGNOSIS — L03116 Cellulitis of left lower limb: Secondary | ICD-10-CM | POA: Diagnosis not present

## 2022-05-01 DIAGNOSIS — I10 Essential (primary) hypertension: Secondary | ICD-10-CM | POA: Diagnosis present

## 2022-05-01 DIAGNOSIS — Z7902 Long term (current) use of antithrombotics/antiplatelets: Secondary | ICD-10-CM

## 2022-05-01 DIAGNOSIS — M25571 Pain in right ankle and joints of right foot: Secondary | ICD-10-CM | POA: Diagnosis present

## 2022-05-01 DIAGNOSIS — H409 Unspecified glaucoma: Secondary | ICD-10-CM | POA: Diagnosis not present

## 2022-05-01 DIAGNOSIS — M4722 Other spondylosis with radiculopathy, cervical region: Secondary | ICD-10-CM | POA: Diagnosis present

## 2022-05-01 DIAGNOSIS — L03818 Cellulitis of other sites: Secondary | ICD-10-CM

## 2022-05-01 DIAGNOSIS — D631 Anemia in chronic kidney disease: Secondary | ICD-10-CM | POA: Diagnosis present

## 2022-05-01 DIAGNOSIS — S31134A Puncture wound of abdominal wall without foreign body, left lower quadrant without penetration into peritoneal cavity, initial encounter: Secondary | ICD-10-CM | POA: Diagnosis present

## 2022-05-01 DIAGNOSIS — I739 Peripheral vascular disease, unspecified: Secondary | ICD-10-CM | POA: Diagnosis present

## 2022-05-01 DIAGNOSIS — B9562 Methicillin resistant Staphylococcus aureus infection as the cause of diseases classified elsewhere: Secondary | ICD-10-CM | POA: Diagnosis present

## 2022-05-01 DIAGNOSIS — Z87891 Personal history of nicotine dependence: Secondary | ICD-10-CM

## 2022-05-01 DIAGNOSIS — T7411XA Adult physical abuse, confirmed, initial encounter: Secondary | ICD-10-CM | POA: Diagnosis present

## 2022-05-01 DIAGNOSIS — G4733 Obstructive sleep apnea (adult) (pediatric): Secondary | ICD-10-CM | POA: Diagnosis present

## 2022-05-01 DIAGNOSIS — L02214 Cutaneous abscess of groin: Principal | ICD-10-CM | POA: Diagnosis present

## 2022-05-01 DIAGNOSIS — N183 Chronic kidney disease, stage 3 unspecified: Secondary | ICD-10-CM | POA: Diagnosis present

## 2022-05-01 DIAGNOSIS — I129 Hypertensive chronic kidney disease with stage 1 through stage 4 chronic kidney disease, or unspecified chronic kidney disease: Secondary | ICD-10-CM | POA: Diagnosis present

## 2022-05-01 DIAGNOSIS — D539 Nutritional anemia, unspecified: Secondary | ICD-10-CM | POA: Diagnosis present

## 2022-05-01 DIAGNOSIS — N1832 Chronic kidney disease, stage 3b: Secondary | ICD-10-CM | POA: Diagnosis present

## 2022-05-01 DIAGNOSIS — K219 Gastro-esophageal reflux disease without esophagitis: Secondary | ICD-10-CM | POA: Diagnosis present

## 2022-05-01 DIAGNOSIS — D696 Thrombocytopenia, unspecified: Secondary | ICD-10-CM | POA: Diagnosis present

## 2022-05-01 DIAGNOSIS — E7800 Pure hypercholesterolemia, unspecified: Secondary | ICD-10-CM | POA: Diagnosis present

## 2022-05-01 DIAGNOSIS — E876 Hypokalemia: Secondary | ICD-10-CM | POA: Diagnosis present

## 2022-05-01 DIAGNOSIS — L0291 Cutaneous abscess, unspecified: Secondary | ICD-10-CM | POA: Diagnosis present

## 2022-05-01 DIAGNOSIS — E785 Hyperlipidemia, unspecified: Secondary | ICD-10-CM | POA: Diagnosis present

## 2022-05-01 DIAGNOSIS — E860 Dehydration: Secondary | ICD-10-CM | POA: Diagnosis present

## 2022-05-01 DIAGNOSIS — E669 Obesity, unspecified: Secondary | ICD-10-CM | POA: Diagnosis present

## 2022-05-01 DIAGNOSIS — Z8249 Family history of ischemic heart disease and other diseases of the circulatory system: Secondary | ICD-10-CM

## 2022-05-01 DIAGNOSIS — E66811 Obesity, class 1: Secondary | ICD-10-CM | POA: Diagnosis present

## 2022-05-01 HISTORY — DX: Cellulitis, unspecified: L03.90

## 2022-05-01 LAB — CBC WITH DIFFERENTIAL/PLATELET
Abs Immature Granulocytes: 0.09 10*3/uL — ABNORMAL HIGH (ref 0.00–0.07)
Basophils Absolute: 0 10*3/uL (ref 0.0–0.1)
Basophils Relative: 0 %
Eosinophils Absolute: 0 10*3/uL (ref 0.0–0.5)
Eosinophils Relative: 0 %
HCT: 35.9 % — ABNORMAL LOW (ref 36.0–46.0)
Hemoglobin: 12 g/dL (ref 12.0–15.0)
Immature Granulocytes: 1 %
Lymphocytes Relative: 12 %
Lymphs Abs: 1.3 10*3/uL (ref 0.7–4.0)
MCH: 34.1 pg — ABNORMAL HIGH (ref 26.0–34.0)
MCHC: 33.4 g/dL (ref 30.0–36.0)
MCV: 102 fL — ABNORMAL HIGH (ref 80.0–100.0)
Monocytes Absolute: 1 10*3/uL (ref 0.1–1.0)
Monocytes Relative: 9 %
Neutro Abs: 8.4 10*3/uL — ABNORMAL HIGH (ref 1.7–7.7)
Neutrophils Relative %: 78 %
Platelets: 106 10*3/uL — ABNORMAL LOW (ref 150–400)
RBC: 3.52 MIL/uL — ABNORMAL LOW (ref 3.87–5.11)
RDW: 13.5 % (ref 11.5–15.5)
WBC: 10.8 10*3/uL — ABNORMAL HIGH (ref 4.0–10.5)
nRBC: 0 % (ref 0.0–0.2)

## 2022-05-01 LAB — BASIC METABOLIC PANEL
Anion gap: 9 (ref 5–15)
BUN: 17 mg/dL (ref 8–23)
CO2: 26 mmol/L (ref 22–32)
Calcium: 8.4 mg/dL — ABNORMAL LOW (ref 8.9–10.3)
Chloride: 105 mmol/L (ref 98–111)
Creatinine, Ser: 1.01 mg/dL — ABNORMAL HIGH (ref 0.44–1.00)
GFR, Estimated: 56 mL/min — ABNORMAL LOW (ref >60)
Glucose, Bld: 93 mg/dL (ref 70–99)
Potassium: 3.6 mmol/L (ref 3.5–5.1)
Sodium: 140 mmol/L (ref 135–145)

## 2022-05-01 MED ORDER — SODIUM CHLORIDE 0.9% FLUSH
3.0000 mL | Freq: Two times a day (BID) | INTRAVENOUS | Status: DC
  Administered 2022-05-02 – 2022-05-05 (×7): 3 mL via INTRAVENOUS

## 2022-05-01 MED ORDER — FENTANYL CITRATE PF 50 MCG/ML IJ SOSY
25.0000 ug | PREFILLED_SYRINGE | Freq: Once | INTRAMUSCULAR | Status: AC
  Administered 2022-05-01: 25 ug via INTRAVENOUS
  Filled 2022-05-01: qty 1

## 2022-05-01 MED ORDER — DORZOLAMIDE HCL-TIMOLOL MAL PF 2-0.5 % OP SOLN: 1.0000 [drp] | Freq: Two times a day (BID) | OPHTHALMIC | Status: DC

## 2022-05-01 MED ORDER — SODIUM CHLORIDE 0.9 % IV SOLN: INTRAVENOUS | Status: AC

## 2022-05-01 MED ORDER — CLOPIDOGREL BISULFATE 75 MG PO TABS
75.0000 mg | ORAL_TABLET | Freq: Every morning | ORAL | Status: DC
  Administered 2022-05-03 – 2022-05-09 (×7): 75 mg via ORAL
  Filled 2022-05-01 (×8): qty 1

## 2022-05-01 MED ORDER — ONDANSETRON HCL 4 MG/2ML IJ SOLN: 4.0000 mg | Freq: Four times a day (QID) | INTRAMUSCULAR | Status: DC | PRN

## 2022-05-01 MED ORDER — DIVALPROEX SODIUM 250 MG PO DR TAB
250.0000 mg | DELAYED_RELEASE_TABLET | Freq: Two times a day (BID) | ORAL | Status: DC
  Administered 2022-05-02 – 2022-05-09 (×16): 250 mg via ORAL
  Filled 2022-05-01 (×16): qty 1

## 2022-05-01 MED ORDER — HYDROMORPHONE HCL 1 MG/ML IJ SOLN
0.5000 mg | INTRAMUSCULAR | Status: DC | PRN
  Administered 2022-05-02 – 2022-05-09 (×13): 0.5 mg via INTRAVENOUS
  Filled 2022-05-01 (×12): qty 0.5
  Filled 2022-05-01: qty 1

## 2022-05-01 MED ORDER — ACETAMINOPHEN 650 MG RE SUPP: 650.0000 mg | Freq: Four times a day (QID) | RECTAL | Status: DC | PRN

## 2022-05-01 MED ORDER — HYDROCODONE-ACETAMINOPHEN 5-325 MG PO TABS
1.0000 | ORAL_TABLET | Freq: Four times a day (QID) | ORAL | Status: DC | PRN
  Administered 2022-05-02 – 2022-05-06 (×10): 1 via ORAL
  Filled 2022-05-01 (×11): qty 1

## 2022-05-01 MED ORDER — ALBUTEROL SULFATE HFA 108 (90 BASE) MCG/ACT IN AERS: 2.0000 | INHALATION_SPRAY | Freq: Four times a day (QID) | RESPIRATORY_TRACT | Status: DC | PRN

## 2022-05-01 MED ORDER — ENOXAPARIN SODIUM 40 MG/0.4ML IJ SOSY
40.0000 mg | PREFILLED_SYRINGE | INTRAMUSCULAR | Status: DC
  Administered 2022-05-02 – 2022-05-08 (×8): 40 mg via SUBCUTANEOUS
  Filled 2022-05-01 (×8): qty 0.4

## 2022-05-01 MED ORDER — BUSPIRONE HCL 5 MG PO TABS
10.0000 mg | ORAL_TABLET | Freq: Three times a day (TID) | ORAL | Status: DC
  Administered 2022-05-02 – 2022-05-09 (×24): 10 mg via ORAL
  Filled 2022-05-01 (×4): qty 2
  Filled 2022-05-01: qty 1
  Filled 2022-05-01 (×10): qty 2
  Filled 2022-05-01: qty 1
  Filled 2022-05-01 (×8): qty 2

## 2022-05-01 MED ORDER — PANTOPRAZOLE SODIUM 40 MG PO TBEC
40.0000 mg | DELAYED_RELEASE_TABLET | Freq: Every day | ORAL | Status: DC
  Administered 2022-05-02 – 2022-05-09 (×8): 40 mg via ORAL
  Filled 2022-05-01 (×8): qty 1

## 2022-05-01 MED ORDER — AMLODIPINE BESYLATE 5 MG PO TABS
2.5000 mg | ORAL_TABLET | Freq: Every day | ORAL | Status: DC
  Administered 2022-05-02 – 2022-05-09 (×8): 2.5 mg via ORAL
  Filled 2022-05-01 (×8): qty 1

## 2022-05-01 MED ORDER — VANCOMYCIN HCL 1250 MG/250ML IV SOLN
1250.0000 mg | Freq: Once | INTRAVENOUS | Status: AC
  Administered 2022-05-01: 1250 mg via INTRAVENOUS
  Filled 2022-05-01: qty 250

## 2022-05-01 MED ORDER — GABAPENTIN 100 MG PO CAPS
200.0000 mg | ORAL_CAPSULE | Freq: Three times a day (TID) | ORAL | Status: DC
  Administered 2022-05-02 – 2022-05-09 (×24): 200 mg via ORAL
  Filled 2022-05-01 (×24): qty 2

## 2022-05-01 MED ORDER — ONDANSETRON HCL 4 MG PO TABS: 4.0000 mg | ORAL_TABLET | Freq: Four times a day (QID) | ORAL | Status: DC | PRN

## 2022-05-01 MED ORDER — ALBUTEROL SULFATE (2.5 MG/3ML) 0.083% IN NEBU: 2.5000 mg | INHALATION_SOLUTION | Freq: Four times a day (QID) | RESPIRATORY_TRACT | Status: DC | PRN

## 2022-05-01 MED ORDER — ASPIRIN 81 MG PO TBEC: 81.0000 mg | DELAYED_RELEASE_TABLET | Freq: Every day | ORAL | Status: DC

## 2022-05-01 MED ORDER — IOHEXOL 300 MG/ML  SOLN
85.0000 mL | Freq: Once | INTRAMUSCULAR | Status: AC | PRN
  Administered 2022-05-01: 85 mL via INTRAVENOUS

## 2022-05-01 MED ORDER — POLYETHYLENE GLYCOL 3350 17 G PO PACK: 17.0000 g | PACK | Freq: Every day | ORAL | Status: DC | PRN

## 2022-05-01 MED ORDER — LATANOPROSTENE BUNOD 0.024 % OP SOLN: 1.0000 [drp] | Freq: Every day | OPHTHALMIC | Status: DC

## 2022-05-01 MED ORDER — ONDANSETRON HCL 4 MG/2ML IJ SOLN
4.0000 mg | Freq: Once | INTRAMUSCULAR | Status: AC
  Administered 2022-05-01: 4 mg via INTRAVENOUS
  Filled 2022-05-01: qty 2

## 2022-05-01 MED ORDER — ACETAMINOPHEN 325 MG PO TABS
650.0000 mg | ORAL_TABLET | Freq: Four times a day (QID) | ORAL | Status: DC | PRN
  Administered 2022-05-08: 650 mg via ORAL
  Filled 2022-05-01: qty 2
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
## 2022-05-01 NOTE — ED Notes (Signed)
Needs labs/IV for CT
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
## 2022-05-01 NOTE — Progress Notes (Signed)
A consult was received from an ED physician for Vancomycin per pharmacy dosing.  The patient's profile has been reviewed for ht/wt/allergies/indication/available labs.   A one time order has been placed for Vancomycin '1250mg'$  IV.    Further antibiotics/pharmacy consults should be ordered by admitting physician if indicated.                       Thank you,  Gretta Arab PharmD, BCPS [facility] main pharmacy [phone] 05/01/2022 8:45 PM
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
## 2022-05-02 DIAGNOSIS — L03116 Cellulitis of left lower limb: Secondary | ICD-10-CM | POA: Diagnosis not present

## 2022-05-02 DIAGNOSIS — M25551 Pain in right hip: Secondary | ICD-10-CM | POA: Diagnosis not present

## 2022-05-02 DIAGNOSIS — N183 Chronic kidney disease, stage 3 unspecified: Secondary | ICD-10-CM | POA: Diagnosis not present

## 2022-05-02 DIAGNOSIS — L03818 Cellulitis of other sites: Secondary | ICD-10-CM

## 2022-05-02 LAB — URINALYSIS, ROUTINE W REFLEX MICROSCOPIC
Bacteria, UA: NONE SEEN
Bilirubin Urine: NEGATIVE
Glucose, UA: NEGATIVE mg/dL
Hgb urine dipstick: NEGATIVE
Ketones, ur: 5 mg/dL — AB
Leukocytes,Ua: NEGATIVE
Nitrite: NEGATIVE
Protein, ur: 100 mg/dL — AB
Specific Gravity, Urine: 1.036 — ABNORMAL HIGH (ref 1.005–1.030)
pH: 6 (ref 5.0–8.0)

## 2022-05-02 LAB — FOLATE: Folate: 19.4 ng/mL (ref >5.9)

## 2022-05-02 LAB — RETICULOCYTES
Immature Retic Fract: 16.2 % — ABNORMAL HIGH (ref 2.3–15.9)
RBC.: 3.24 MIL/uL — ABNORMAL LOW (ref 3.87–5.11)
Retic Count, Absolute: 76.8 10*3/uL (ref 19.0–186.0)
Retic Ct Pct: 2.4 % (ref 0.4–3.1)

## 2022-05-02 LAB — COMPREHENSIVE METABOLIC PANEL
ALT: 8 U/L (ref 0–44)
AST: 11 U/L — ABNORMAL LOW (ref 15–41)
Albumin: 3.1 g/dL — ABNORMAL LOW (ref 3.5–5.0)
Alkaline Phosphatase: 52 U/L (ref 38–126)
Anion gap: 9 (ref 5–15)
BUN: 15 mg/dL (ref 8–23)
CO2: 23 mmol/L (ref 22–32)
Calcium: 7.9 mg/dL — ABNORMAL LOW (ref 8.9–10.3)
Chloride: 107 mmol/L (ref 98–111)
Creatinine, Ser: 1.01 mg/dL — ABNORMAL HIGH (ref 0.44–1.00)
GFR, Estimated: 56 mL/min — ABNORMAL LOW (ref >60)
Glucose, Bld: 80 mg/dL (ref 70–99)
Potassium: 3.2 mmol/L — ABNORMAL LOW (ref 3.5–5.1)
Sodium: 139 mmol/L (ref 135–145)
Total Bilirubin: 1.3 mg/dL — ABNORMAL HIGH (ref 0.3–1.2)
Total Protein: 6.2 g/dL — ABNORMAL LOW (ref 6.5–8.1)

## 2022-05-02 LAB — CBC
HCT: 32.7 % — ABNORMAL LOW (ref 36.0–46.0)
Hemoglobin: 11 g/dL — ABNORMAL LOW (ref 12.0–15.0)
MCH: 34.6 pg — ABNORMAL HIGH (ref 26.0–34.0)
MCHC: 33.6 g/dL (ref 30.0–36.0)
MCV: 102.8 fL — ABNORMAL HIGH (ref 80.0–100.0)
Platelets: 93 10*3/uL — ABNORMAL LOW (ref 150–400)
RBC: 3.18 MIL/uL — ABNORMAL LOW (ref 3.87–5.11)
RDW: 13.6 % (ref 11.5–15.5)
WBC: 8.2 10*3/uL (ref 4.0–10.5)
nRBC: 0 % (ref 0.0–0.2)

## 2022-05-02 LAB — VITAMIN B12: Vitamin B-12: 1445 pg/mL — ABNORMAL HIGH (ref 180–914)

## 2022-05-02 LAB — IRON AND TIBC
Iron: 37 ug/dL (ref 28–170)
Saturation Ratios: 19 % (ref 10.4–31.8)
TIBC: 200 ug/dL — ABNORMAL LOW (ref 250–450)
UIBC: 163 ug/dL

## 2022-05-02 LAB — FERRITIN: Ferritin: 214 ng/mL (ref 11–307)

## 2022-05-02 MED ORDER — LACTATED RINGERS IV SOLN: INTRAVENOUS | Status: AC

## 2022-05-02 MED ORDER — VANCOMYCIN HCL 750 MG/150ML IV SOLN
750.0000 mg | INTRAVENOUS | Status: DC
  Administered 2022-05-03 – 2022-05-08 (×6): 750 mg via INTRAVENOUS
  Filled 2022-05-02 (×6): qty 150
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
## 2022-05-02 NOTE — Progress Notes (Signed)
Pharmacy Antibiotic Note  [name] [name] is a 81 y.o. adult admitted on 05/01/2022 with purulent cellulitis.  Pharmacy has been consulted for Vancomycin dosing.  Plan: Vancomycin '750mg'$  IV q24h to target AUC 400-550.  Estimated AUC 539. Check Vancomycin levels at steady state Monitor renal function and cx data   Height: 5' (152.4 cm) Weight: 64.4 kg (142 lb) IBW/kg (Calculated) : 45.5  Temp (24hrs), Avg:98.8 F (37.1 C), Min:98.8 F (37.1 C), Max:98.8 F (37.1 C)  Recent Labs  Lab 05/01/22 1748  WBC 10.8*  CREATININE 1.01*    Estimated Creatinine Clearance (by C-G formula based on SCr of 1.01 mg/dL (H)) Female: 36 mL/min (A) Female: 44.5 mL/min (A)    Allergies  Allergen Reactions   Statins    Gabapentin Itching    Was deleted since patient was taking, but now experiencing itching again.    Statins Swelling, Rash and Other (See Comments)    Swelling involving tongue; also causes muscle pain    Antimicrobials this admission: 10/23 Vancomycin >>   Dose adjustments this admission:  Microbiology results: MRSA PCR: ordered  Thank you for allowing pharmacy to be a part of this patient's care.  Netta Cedars PharmD 05/02/2022 12:30 AM

## 2022-05-02 NOTE — ED Notes (Signed)
Patient is asleep in bed
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
## 2022-05-03 DIAGNOSIS — N1832 Chronic kidney disease, stage 3b: Secondary | ICD-10-CM | POA: Diagnosis present

## 2022-05-03 DIAGNOSIS — Z7902 Long term (current) use of antithrombotics/antiplatelets: Secondary | ICD-10-CM | POA: Diagnosis not present

## 2022-05-03 DIAGNOSIS — F419 Anxiety disorder, unspecified: Secondary | ICD-10-CM | POA: Diagnosis present

## 2022-05-03 DIAGNOSIS — Q600 Renal agenesis, unilateral: Secondary | ICD-10-CM | POA: Diagnosis not present

## 2022-05-03 DIAGNOSIS — D696 Thrombocytopenia, unspecified: Secondary | ICD-10-CM | POA: Diagnosis present

## 2022-05-03 DIAGNOSIS — N179 Acute kidney failure, unspecified: Secondary | ICD-10-CM | POA: Diagnosis present

## 2022-05-03 DIAGNOSIS — T7411XA Adult physical abuse, confirmed, initial encounter: Secondary | ICD-10-CM | POA: Diagnosis present

## 2022-05-03 DIAGNOSIS — L02214 Cutaneous abscess of groin: Secondary | ICD-10-CM | POA: Diagnosis present

## 2022-05-03 DIAGNOSIS — H409 Unspecified glaucoma: Secondary | ICD-10-CM | POA: Diagnosis present

## 2022-05-03 DIAGNOSIS — E860 Dehydration: Secondary | ICD-10-CM | POA: Diagnosis present

## 2022-05-03 DIAGNOSIS — L0291 Cutaneous abscess, unspecified: Secondary | ICD-10-CM | POA: Diagnosis present

## 2022-05-03 DIAGNOSIS — I129 Hypertensive chronic kidney disease with stage 1 through stage 4 chronic kidney disease, or unspecified chronic kidney disease: Secondary | ICD-10-CM | POA: Diagnosis present

## 2022-05-03 DIAGNOSIS — M353 Polymyalgia rheumatica: Secondary | ICD-10-CM | POA: Diagnosis present

## 2022-05-03 DIAGNOSIS — D631 Anemia in chronic kidney disease: Secondary | ICD-10-CM | POA: Diagnosis present

## 2022-05-03 DIAGNOSIS — L03314 Cellulitis of groin: Secondary | ICD-10-CM | POA: Diagnosis present

## 2022-05-03 DIAGNOSIS — D539 Nutritional anemia, unspecified: Secondary | ICD-10-CM | POA: Diagnosis present

## 2022-05-03 DIAGNOSIS — M4722 Other spondylosis with radiculopathy, cervical region: Secondary | ICD-10-CM | POA: Diagnosis present

## 2022-05-03 DIAGNOSIS — E876 Hypokalemia: Secondary | ICD-10-CM | POA: Diagnosis present

## 2022-05-03 DIAGNOSIS — G4733 Obstructive sleep apnea (adult) (pediatric): Secondary | ICD-10-CM | POA: Diagnosis present

## 2022-05-03 DIAGNOSIS — I739 Peripheral vascular disease, unspecified: Secondary | ICD-10-CM | POA: Diagnosis present

## 2022-05-03 DIAGNOSIS — L03116 Cellulitis of left lower limb: Secondary | ICD-10-CM | POA: Diagnosis not present

## 2022-05-03 DIAGNOSIS — K219 Gastro-esophageal reflux disease without esophagitis: Secondary | ICD-10-CM | POA: Diagnosis present

## 2022-05-03 DIAGNOSIS — M25571 Pain in right ankle and joints of right foot: Secondary | ICD-10-CM | POA: Diagnosis not present

## 2022-05-03 DIAGNOSIS — S31134A Puncture wound of abdominal wall without foreign body, left lower quadrant without penetration into peritoneal cavity, initial encounter: Secondary | ICD-10-CM | POA: Diagnosis present

## 2022-05-03 DIAGNOSIS — F32A Depression, unspecified: Secondary | ICD-10-CM | POA: Diagnosis present

## 2022-05-03 DIAGNOSIS — E669 Obesity, unspecified: Secondary | ICD-10-CM | POA: Diagnosis present

## 2022-05-03 DIAGNOSIS — E7800 Pure hypercholesterolemia, unspecified: Secondary | ICD-10-CM | POA: Diagnosis present

## 2022-05-03 HISTORY — DX: Cutaneous abscess, unspecified: L02.91

## 2022-05-03 LAB — BASIC METABOLIC PANEL
Anion gap: 8 (ref 5–15)
BUN: 21 mg/dL (ref 8–23)
CO2: 25 mmol/L (ref 22–32)
Calcium: 8.1 mg/dL — ABNORMAL LOW (ref 8.9–10.3)
Chloride: 107 mmol/L (ref 98–111)
Creatinine, Ser: 1.06 mg/dL — ABNORMAL HIGH (ref 0.44–1.00)
GFR, Estimated: 52 mL/min — ABNORMAL LOW (ref >60)
Glucose, Bld: 119 mg/dL — ABNORMAL HIGH (ref 70–99)
Potassium: 3.3 mmol/L — ABNORMAL LOW (ref 3.5–5.1)
Sodium: 140 mmol/L (ref 135–145)

## 2022-05-03 LAB — CBC
HCT: 34.4 % — ABNORMAL LOW (ref 36.0–46.0)
Hemoglobin: 11.3 g/dL — ABNORMAL LOW (ref 12.0–15.0)
MCH: 33.7 pg (ref 26.0–34.0)
MCHC: 32.8 g/dL (ref 30.0–36.0)
MCV: 102.7 fL — ABNORMAL HIGH (ref 80.0–100.0)
Platelets: 104 10*3/uL — ABNORMAL LOW (ref 150–400)
RBC: 3.35 MIL/uL — ABNORMAL LOW (ref 3.87–5.11)
RDW: 14.1 % (ref 11.5–15.5)
WBC: 7.2 10*3/uL (ref 4.0–10.5)
nRBC: 0 % (ref 0.0–0.2)

## 2022-05-03 LAB — MAGNESIUM: Magnesium: 1.8 mg/dL (ref 1.7–2.4)

## 2022-05-03 MED ORDER — POTASSIUM CHLORIDE CRYS ER 20 MEQ PO TBCR
40.0000 meq | EXTENDED_RELEASE_TABLET | ORAL | Status: AC
  Administered 2022-05-03: 40 meq via ORAL
  Filled 2022-05-03: qty 2

## 2022-05-03 MED ORDER — SENNA 8.6 MG PO TABS
1.0000 | ORAL_TABLET | Freq: Once | ORAL | Status: AC
  Administered 2022-05-03: 8.6 mg via ORAL
  Filled 2022-05-03: qty 1

## 2022-05-03 NOTE — TOC Progression Note (Signed)
Transition of Care [facility]) - Progression Note    Patient Details  Name: [name] [name] MRN: [id number] Date of Birth: 1941-05-24  Transition of Care [facility] CM/SW Contact  Purcell Mouton, RN Phone Number: 05/03/2022, 9:29 AM  Clinical Narrative:     A follow up call was made to pt's grandson Josh this am for him to call Heather at [facility]. Heather's telephone number [phone] was given to [facility], who states he will call her.        Expected Discharge Plan and Services                                                 Social Determinants of Health (SDOH) Interventions Food Insecurity Interventions: Inpatient TOC Housing Interventions: Inpatient TOC Transportation Interventions: [facility]) Utilities Interventions: Intervention Not Indicated  Readmission Risk Interventions     No data to display

## 2022-05-03 NOTE — Progress Notes (Signed)
Mobility Specialist - Progress Note   05/03/22 1501  Mobility  Activity Transferred to/from [facility]  Level of Assistance Minimal assist, patient does 75% or more  Assistive Device None  Mobility Referral No  $Mobility charge 1 Mobility   Assisted pt w/ transfer to [facility] Pt to bed afterwards.   [facility]
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
## 2022-05-04 DIAGNOSIS — L03116 Cellulitis of left lower limb: Secondary | ICD-10-CM | POA: Diagnosis not present

## 2022-05-04 LAB — BASIC METABOLIC PANEL
Anion gap: 6 (ref 5–15)
BUN: 16 mg/dL (ref 8–23)
CO2: 26 mmol/L (ref 22–32)
Calcium: 8.8 mg/dL — ABNORMAL LOW (ref 8.9–10.3)
Chloride: 111 mmol/L (ref 98–111)
Creatinine, Ser: 1 mg/dL (ref 0.44–1.00)
GFR, Estimated: 56 mL/min — ABNORMAL LOW (ref >60)
Glucose, Bld: 94 mg/dL (ref 70–99)
Potassium: 4.2 mmol/L (ref 3.5–5.1)
Sodium: 143 mmol/L (ref 135–145)

## 2022-05-04 LAB — GLUCOSE, CAPILLARY
Glucose-Capillary: 120 mg/dL — ABNORMAL HIGH (ref 70–99)
Glucose-Capillary: 107 mg/dL — ABNORMAL HIGH (ref 70–99)

## 2022-05-04 LAB — MRSA NEXT GEN BY PCR, NASAL: MRSA by PCR Next Gen: DETECTED — AB

## 2022-05-04 MED ORDER — MUPIROCIN 2 % EX OINT
1.0000 | TOPICAL_OINTMENT | Freq: Two times a day (BID) | CUTANEOUS | Status: AC
  Administered 2022-05-04 – 2022-05-09 (×10): 1 via NASAL
  Filled 2022-05-04: qty 22

## 2022-05-04 MED ORDER — CHLORHEXIDINE GLUCONATE CLOTH 2 % EX PADS
6.0000 | MEDICATED_PAD | Freq: Every day | CUTANEOUS | Status: AC
  Administered 2022-05-05 – 2022-05-09 (×5): 6 via TOPICAL

## 2022-05-04 NOTE — Progress Notes (Signed)
I will leave Spiritual Care consult in the cue for next chaplain to get familiarized with how complex this patient's care is.
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
## 2022-05-04 NOTE — Progress Notes (Signed)
   Subjective/Chief Complaint: Patient doing better from a pain standpoint today.    Objective: Vital signs in last 24 hours: Temp:  [97.9 F (36.6 C)-98.8 F (37.1 C)] 98.7 F (37.1 C) (10/26 0455) Pulse Rate:  [79-85] 85 (10/26 0455) Resp:  [18] 18 (10/26 0455) BP: (114-134)/(54-65) 133/65 (10/26 0455) SpO2:  [95 %-100 %] 97 % (10/26 0455) Last BM Date : 04/27/22  Intake/Output from previous day: 10/25 0701 - 10/26 0700 In: 1687.2 [P.O.:1080; I.V.:351; IV Piggyback:256.2] Out: 2100 [Urine:2100] Intake/Output this shift: No intake/output data recorded.  PE:  Constitutional: No acute distress, conversant, appears states age. Eyes: Anicteric sclerae, moist conjunctiva, no lid lag Lungs: Clear to auscultation bilaterally, normal respiratory effort CV: regular rate and rhythm, no murmurs, no peripheral edema, pedal pulses 2+ GI: Soft, no masses or hepatosplenomegaly, non-tender to palpation Skin: No rashes, palpation reveals normal turgor Psychiatric: appropriate judgment and insight, oriented to person, place, and time Left inguinal wound: Erythematous, drainage purulence, bloody  Lab Results:  Recent Labs    05/02/22 0535 05/03/22 0415  WBC 8.2 7.2  HGB 11.0* 11.3*  HCT 32.7* 34.4*  PLT 93* 104*   BMET Recent Labs    05/03/22 0415 05/04/22 0422  NA 140 143  K 3.3* 4.2  CL 107 111  CO2 25 26  GLUCOSE 119* 94  BUN 21 16  CREATININE 1.06* 1.00  CALCIUM 8.1* 8.8*   PT/INR No results for input(s): "LABPROT", "INR" in the last 72 hours. ABG No results for input(s): "PHART", "HCO3" in the last 72 hours.  Invalid input(s): "PCO2", "PO2"  Studies/Results: No results found.  Anti-infectives: Anti-infectives (From admission, onward)    Start     Dose/Rate Route Frequency Ordered Stop   05/03/22 0500  vancomycin (VANCOREADY) IVPB 750 mg/150 mL        750 mg 150 mL/hr over 60 Minutes Intravenous Every 24 hours 05/02/22 0042     05/01/22 2100   vancomycin (VANCOREADY) IVPB 1250 mg/250 mL        1,250 mg 166.7 mL/hr over 90 Minutes Intravenous  Once 05/01/22 2054 05/02/22 0327       Assessment/Plan: Left inguinal abscess after trauma to the area - continue BID packing with iodoform to start today - continue IV abx for cellulitis  - patient states she does not have family/friends to help with wound care at home. She may need temporary SNF placement for wound care.  - CCS will follow     FEN - Regular VTE - SCD's ID - Vanc,  Admit - TRH service   CKD IIIb PVD - ok to continue plavix  Macrocytic anemia   LOS: 1 day    Ralene Ok 05/04/2022
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
## 2022-05-04 NOTE — Progress Notes (Signed)
Spiritual Care consult requesting support for this patient. Met with her for a lengthy period. She was free to share her grief, detailing each event as well as some very difficult family dynamics. We shared some mutually enjoyable conversation around our shared faith. Patient was very receptive and appreciative of the visit.

## 2022-05-04 NOTE — Plan of Care (Signed)
Plan of care reviewed and discussed with the patient. 

## 2022-05-05 DIAGNOSIS — L03116 Cellulitis of left lower limb: Secondary | ICD-10-CM | POA: Diagnosis not present

## 2022-05-05 LAB — GLUCOSE, CAPILLARY: Glucose-Capillary: 92 mg/dL (ref 70–99)
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
## 2022-05-05 NOTE — Progress Notes (Signed)
   Subjective/Chief Complaint: Patient having a tough day with outside events.  Some improvement in pain in her left groin.    Objective: Vital signs in last 24 hours: Temp:  [98.6 F (37 C)-99.3 F (37.4 C)] 98.6 F (37 C) (10/27 0450) Pulse Rate:  [73-85] 73 (10/27 0450) Resp:  [16] 16 (10/27 0450) BP: (130-136)/(71-74) 136/74 (10/27 0450) SpO2:  [97 %-99 %] 97 % (10/27 0450) Last BM Date : 05/03/22  Intake/Output from previous day: 10/26 0701 - 10/27 0700 In: 1271.6 [P.O.:1080; IV Piggyback:191.6] Out: 400 [Urine:400] Intake/Output this shift: No intake/output data recorded.  PE: GI: Soft, Left inguinal wound is still erythematous, drainage purulence, bloody, but less.  Still with small amount of fibrinous clot present.  Lab Results:  Recent Labs    05/03/22 0415  WBC 7.2  HGB 11.3*  HCT 34.4*  PLT 104*   BMET Recent Labs    05/03/22 0415 05/04/22 0422  NA 140 143  K 3.3* 4.2  CL 107 111  CO2 25 26  GLUCOSE 119* 94  BUN 21 16  CREATININE 1.06* 1.00  CALCIUM 8.1* 8.8*   PT/INR No results for input(s): "LABPROT", "INR" in the last 72 hours. ABG No results for input(s): "PHART", "HCO3" in the last 72 hours.  Invalid input(s): "PCO2", "PO2"  Studies/Results: No results found.  Anti-infectives: Anti-infectives (From admission, onward)    Start     Dose/Rate Route Frequency Ordered Stop   05/03/22 0500  vancomycin (VANCOREADY) IVPB 750 mg/150 mL        750 mg 150 mL/hr over 60 Minutes Intravenous Every 24 hours 05/02/22 0042     05/01/22 2100  vancomycin (VANCOREADY) IVPB 1250 mg/250 mL        1,250 mg 166.7 mL/hr over 90 Minutes Intravenous  Once 05/01/22 2054 05/02/22 0327       Assessment/Plan: Left inguinal abscess after trauma to the area - continue BID packing with iodoform to start today - continue IV abx for cellulitis  - patient states she does not have family/friends to help with wound care at home.  - CCS will follow     FEN  - Regular VTE - SCD's ID - Vanc,  Admit - TRH service   CKD IIIb PVD - ok to continue plavix  Macrocytic anemia   LOS: 2 days    Henreitta Cea 05/05/2022
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
## 2022-05-05 NOTE — Progress Notes (Signed)
Pharmacy Antibiotic Note  [name] [name] is a 81 y.o. adult admitted on 05/01/2022 with purulent cellulitis.  Pharmacy has been consulted for Vancomycin dosing.  05/05/2022 Day 4 ABX, afebrile, WBC WNL Wound continues to drain pus 10/26 Cr 1.00; CrCl stable Wound culture sent today  Plan: Continue Vancomycin '750mg'$  IV q24h to target AUC 400-550.  Estimated AUC 539. Check Vancomycin levels at steady state Monitor renal function and cx data  F/u transition to oral ABX   Height: 5' (152.4 cm) Weight: 64.4 kg (142 lb) IBW/kg (Calculated) : 45.5  Temp (24hrs), Avg:98.6 F (37 C), Min:98 F (36.7 C), Max:99.3 F (37.4 C)  Recent Labs  Lab 05/01/22 1748 05/02/22 0535 05/03/22 0415 05/04/22 0422  WBC 10.8* 8.2 7.2  --   CREATININE 1.01* 1.01* 1.06* 1.00     Estimated Creatinine Clearance (by C-G formula based on SCr of 1 mg/dL) Female: 36.4 mL/min Female: 45 mL/min    Allergies  Allergen Reactions   Statins    Gabapentin Itching    Was deleted since patient was taking, but now experiencing itching again.    Statins Swelling, Rash and Other (See Comments)    Swelling involving tongue; also causes muscle pain    Antimicrobials this admission: 10/23 Vancomycin >>   Dose adjustments this admission:  Microbiology results: 10/26 MRSA PCR: + 10/27 Abscess culture sent today  Thank you for allowing pharmacy to be a part of this patient's care.  Earvin Hansen PharmD Candidate 05/05/2022 1:05 PM

## 2022-05-06 DIAGNOSIS — L0291 Cutaneous abscess, unspecified: Secondary | ICD-10-CM | POA: Diagnosis not present

## 2022-05-06 DIAGNOSIS — L03116 Cellulitis of left lower limb: Secondary | ICD-10-CM | POA: Diagnosis not present

## 2022-05-06 LAB — BASIC METABOLIC PANEL
Anion gap: 5 (ref 5–15)
BUN: 14 mg/dL (ref 8–23)
CO2: 28 mmol/L (ref 22–32)
Calcium: 9.2 mg/dL (ref 8.9–10.3)
Chloride: 106 mmol/L (ref 98–111)
Creatinine, Ser: 0.85 mg/dL (ref 0.44–1.00)
GFR, Estimated: >60 mL/min (ref >60)
Glucose, Bld: 94 mg/dL (ref 70–99)
Potassium: 4.1 mmol/L (ref 3.5–5.1)
Sodium: 139 mmol/L (ref 135–145)

## 2022-05-06 LAB — CBC
HCT: 34.3 % — ABNORMAL LOW (ref 36.0–46.0)
Hemoglobin: 11.2 g/dL — ABNORMAL LOW (ref 12.0–15.0)
MCH: 33.5 pg (ref 26.0–34.0)
MCHC: 32.7 g/dL (ref 30.0–36.0)
MCV: 102.7 fL — ABNORMAL HIGH (ref 80.0–100.0)
Platelets: 138 10*3/uL — ABNORMAL LOW (ref 150–400)
RBC: 3.34 MIL/uL — ABNORMAL LOW (ref 3.87–5.11)
RDW: 14.3 % (ref 11.5–15.5)
WBC: 6 10*3/uL (ref 4.0–10.5)
nRBC: 0 % (ref 0.0–0.2)

## 2022-05-06 MED ORDER — SODIUM CHLORIDE 0.9 % IV SOLN
1.0000 g | INTRAVENOUS | Status: DC
  Administered 2022-05-06: 1 g via INTRAVENOUS
  Filled 2022-05-06 (×2): qty 10
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
## 2022-05-06 NOTE — Progress Notes (Signed)
[name] [name] [id number] May 26, 1941  CARE TEAM:  PCP: [name], MD  Outpatient Care Team: Patient Care Team: [name], MD as PCP - General (Family Medicine) [name], MD as PCP - Cardiology (Cardiology) Nicolette Bang, MD (Family Medicine)  Inpatient Treatment Team: Treatment Team: Attending Provider: Debbe Odea, MD; Rounding Team: Fatima Blank, MD; Consulting Physician: Edison Pace, Md, MD; Registered Nurse: Alva Garnet, RN; Utilization Review: Claudie Leach, RN; Pharmacist: Eudelia Bunch, [facility]; Case Manager: Addison Naegeli, RN   Problem List:   Principal Problem:   Cellulitis Active Problems:   Essential hypertension   Polymyalgia rheumatica (HCC)   GERD (gastroesophageal reflux disease)   Depression   Peripheral vascular disease (HCC)   Anemia in chronic kidney disease   Dyslipidemia   CKD (chronic kidney disease) stage 3, GFR 30-59 ml/min (HCC)   Obesity (BMI 30.0-34.9)   Abscess      * No surgery found *      Assessment  Stabilizing.  [facility] Stay = 3 days)  Assessment/Plan: Left inguinal abscess after trauma to the area - continue BID packing with iodoform to start today - continue IV abx for cellulitis -vancomycin given MRSA - patient states she does not have family/friends to help with wound care at home -looks like plan is to consider skilled facility to help. - [facility] will follow     FEN - Regular VTE - SCD's ID - Vanc,  Admit - TRH service   CKD IIIb PVD - ok to continue plavix  Macrocytic anemia      I reviewed nursing notes, hospitalist notes, last 24 h vitals and pain scores, last 48 h intake and output, last 24 h labs and trends, and last 24 h imaging results. I have reviewed this patient's available data, including medical history, events of note, test results, etc as part of my evaluation.  A significant portion of that time was spent in counseling.  Care during the described time interval was  provided by me.  This care required moderate level of medical decision making.  05/06/2022    Subjective: (Chief complaint)  Patient with less pain.  Dressing changed.  Nursing just outside room.  She felt cellulitis was going down.  Using urine work while in bed.  Objective:  Vital signs:  Vitals:   05/05/22 0450 05/05/22 1103 05/05/22 2135 05/06/22 0510  BP: 136/74 (!) 142/80 (!) 122/57 135/73  Pulse: 73 96 100 84  Resp: '16 16 16 16  '$ Temp: 98.6 F (37 C) 98 F (36.7 C) 98.6 F (37 C) 97.6 F (36.4 C)  TempSrc: Oral Oral Oral Oral  SpO2: 97% 98% 98% 95%  Weight:      Height:        Last BM Date : 05/03/22  Intake/Output   Yesterday:  10/27 0701 - 10/28 0700 In: 540.4 [P.O.:480; IV Piggyback:60.4] Out: 1820 [Urine:1820] This shift:  Total I/O In: 240 [P.O.:240] Out: 750 [Urine:750]  Bowel function:  Flatus: YES  BM:  No  Drain: (No drain)   Physical Exam:  General: Pt awake/alert in no acute distress.  Chatty.  Pleasant.  Not toxic.  Not sickly. Eyes: PERRL, normal EOM.  Sclera clear.  No icterus Neuro: CN II-XII intact w/o focal sensory/motor deficits. Lymph: No head/neck/groin lymphadenopathy Psych:  No delerium/psychosis/paranoia.  Oriented x 4 HENT: Normocephalic, Mucus membranes moist.  No thrush Neck: Supple, No tracheal deviation.  No obvious thyromegaly Chest: No pain to chest wall compression.  Good  respiratory excursion.  No audible wheezing CV:  Pulses intact.  Regular rhythm.  No major extremity edema MS: Normal AROM mjr joints.  No obvious deformity Abdomen: Soft.  Nondistended.  Nontender.  No evidence of peritonitis.  No incarcerated hernias.  Left knee open wound with 2 openings with iodoform gauze 10-63m region of erythema around those wounds. Some swelling and edema.  However no progression.  No purulent drainage nor bleeding.  Right groin without inflammation or cellulitis.   Ext:   No deformity.  No mjr edema.  No  cyanosis Skin: No petechiae / purpurea.  No major sores.  Warm and dry    Results:   Cultures: Recent Results (from the past 720 hour(s))  MRSA Next Gen by PCR, Nasal     Status: Abnormal   Collection Time: 05/04/22  2:28 PM   Specimen: Nasal Mucosa; Nasal Swab  Result Value Ref Range Status   MRSA by PCR Next Gen DETECTED (A) NOT DETECTED Final    Comment: (NOTE) The GeneXpert MRSA Assay (FDA approved for NASAL specimens only), is one component of a comprehensive MRSA colonization surveillance program. It is not intended to diagnose MRSA infection nor to guide or monitor treatment for MRSA infections. Test performance is not FDA approved in patients less than 238years old. Performed at [facility] [address], [place] [address]  Aerobic/Anaerobic Culture w Gram Stain (surgical/deep wound)     Status: None (Preliminary result)   Collection Time: 05/05/22  9:22 AM   Specimen: Abscess  Result Value Ref Range Status   Specimen Description   Final    ABSCESS Performed at [address] [place] [address]   Special Requests   Final    NONE Performed at [facility] [address] [place]   Gram Stain   Final    RARE WBC PRESENT, PREDOMINANTLY PMN RARE GRAM POSITIVE COCCI IN CLUSTERS Performed at [facility] [address] [place] [address]   Culture PENDING  Incomplete   Report Status PENDING  Incomplete    Labs: Results for orders placed or performed during the hospital encounter of 05/01/22 (from the past 48 hour(s))  Glucose, capillary     Status: None   Collection Time: 05/04/22 11:54 AM  Result Value Ref Range   Glucose-Capillary 92 70 - 99 mg/dL    Comment: Glucose reference range applies only to samples taken after fasting for at least 8 hours.  MRSA Next Gen by PCR, Nasal     Status: Abnormal   Collection Time: 05/04/22  2:28  PM   Specimen: Nasal Mucosa; Nasal Swab  Result Value Ref Range   MRSA by PCR Next Gen DETECTED (A) NOT DETECTED    Comment: (NOTE) The GeneXpert MRSA Assay (FDA approved for NASAL specimens only), is one component of a comprehensive MRSA colonization surveillance program. It is not intended to diagnose MRSA infection nor to guide or monitor treatment for MRSA infections. Test performance is not FDA approved in patients less than 22years old. Performed at [facility] [address], [place] [address]  Aerobic/Anaerobic Culture w Gram Stain (surgical/deep wound)     Status: None (Preliminary result)   Collection Time: 05/05/22  9:22 AM   Specimen: Abscess  Result Value Ref Range   Specimen Description      ABSCESS Performed at [facility], [place] [address] [place], [place] [address]    Special Requests      NONE Performed at [facility], [place] [address], [place], [place] [address]    Gram Stain      RARE WBC PRESENT, PREDOMINANTLY PMN RARE GRAM POSITIVE COCCI IN CLUSTERS Performed at [facility], [place] [address] [place], [place] [address]    Culture PENDING    Report Status PENDING   CBC     Status: Abnormal   Collection Time: 05/06/22  4:50 AM  Result Value Ref Range   WBC 6.0 4.0 - 10.5 K/uL   RBC 3.34 (L) 3.87 - 5.11 MIL/uL   Hemoglobin 11.2 (L) 12.0 - 15.0 g/dL   HCT 34.3 (L) 36.0 - 46.0 %   MCV 102.7 (H) 80.0 - 100.0 fL   MCH 33.5 26.0 - 34.0 pg   MCHC 32.7 30.0 - 36.0 g/dL   RDW 14.3 11.5 - 15.5 %   Platelets 138 (L) 150 - 400 K/uL   nRBC 0.0 0.0 - 0.2 %    Comment: Performed at [facility], [place] [address], [place], [place] [address]  Basic metabolic panel     Status: None   Collection Time: 05/06/22  4:50 AM  Result Value Ref Range   Sodium 139 135 - 145 mmol/L   Potassium 4.1 3.5 - 5.1 mmol/L   Chloride 106 98 - 111 mmol/L   CO2 28 22 - 32 mmol/L   Glucose, Bld  94 70 - 99 mg/dL    Comment: Glucose reference range applies only to samples taken after fasting for at least 8 hours.   BUN 14 8 - 23 mg/dL   Creatinine, Ser 0.85 0.44 - 1.00 mg/dL   Calcium 9.2 8.9 - 10.3 mg/dL   GFR, Estimated >60 >60 mL/min    Comment: (NOTE) Calculated using the CKD-EPI Creatinine Equation (2021)    Anion gap 5 5 - 15    Comment: Performed at [facility], [place] [address], [place], [place] [address]    Imaging / Studies: No results found.  Medications / Allergies: per chart  Antibiotics: Anti-infectives (From admission, onward)    Start     Dose/Rate Route Frequency Ordered Stop   05/03/22 0500  vancomycin (VANCOREADY) IVPB 750 mg/150 mL        750 mg 150 mL/hr over 60 Minutes Intravenous Every 24 hours 05/02/22 0042     05/01/22 2100  vancomycin (VANCOREADY) IVPB 1250 mg/250 mL        1,250 mg 166.7 mL/hr over 90 Minutes Intravenous  Once 05/01/22 2054 05/02/22 0327         Note: Portions of this report may have been transcribed using voice recognition software. Every effort was made to ensure accuracy; however, inadvertent computerized transcription errors may be present.   Any transcriptional errors that result from this process are unintentional.    Adin Hector, MD, FACS, MASCRS Esophageal, Gastrointestinal & Colorectal Surgery Robotic and Minimally Invasive Surgery  [facility] [place]. [address], [place], [place] [address] [phone] Fax [phone] Main  CONTACT INFORMATION:  Weekday (9AM-5PM): Call [facility] main office at [phone]  Weeknight (5PM-9AM) or Weekend/Holiday: Check [email or web address] (password " [email or web address]") for General Surgery [facility] coverage  (Please, do not use SecureChat as it is not reliable communication to reach operating surgeons for immediate patient care given surgeries/outpatient duties/clinic/cross-coverage/off post-call which would lead  to a delay in care.  Epic staff messaging available for outptient concerns, but may not be answered for 48 hours or more).     05/06/2022  11:53 AM

## 2022-05-07 DIAGNOSIS — M25571 Pain in right ankle and joints of right foot: Secondary | ICD-10-CM | POA: Diagnosis not present

## 2022-05-07 DIAGNOSIS — L0291 Cutaneous abscess, unspecified: Secondary | ICD-10-CM | POA: Diagnosis not present

## 2022-05-07 DIAGNOSIS — L03116 Cellulitis of left lower limb: Secondary | ICD-10-CM | POA: Diagnosis not present

## 2022-05-07 MED ORDER — CEFAZOLIN SODIUM-DEXTROSE 1-4 GM/50ML-% IV SOLN
1.0000 g | Freq: Three times a day (TID) | INTRAVENOUS | Status: DC
  Administered 2022-05-07 – 2022-05-09 (×6): 1 g via INTRAVENOUS
  Filled 2022-05-07 (×9): qty 50
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
## 2022-05-07 NOTE — Progress Notes (Signed)
Mobility Specialist - Progress Note   05/07/22 1015  Mobility  Activity Ambulated with assistance in room  Level of Assistance Minimal assist, patient does 75% or more  Assistive Device Front wheel walker  Distance Ambulated (ft) 10 ft  Activity Response Tolerated well  $Mobility charge 1 Mobility   Pt requested to use bathroom upon entry, was assisted to [facility]. Pt c/o pain in R ankle pain when standing and requested to return to bed. Pt left in bed with needs in reach and bed alarm on.   [place] Specialist Acute Rehabilitation Services Phone: [phone] 05/07/22, 10:17 AM

## 2022-05-07 NOTE — Plan of Care (Signed)
  Problem: Clinical Measurements: Goal: Ability to avoid or minimize complications of infection will improve Outcome: Progressing   Problem: Activity: Goal: Risk for activity intolerance will decrease Outcome: Progressing   Problem: Elimination: Goal: Will not experience complications related to bowel motility Outcome: Progressing

## 2022-05-08 DIAGNOSIS — L0291 Cutaneous abscess, unspecified: Secondary | ICD-10-CM | POA: Diagnosis not present

## 2022-05-08 DIAGNOSIS — L03116 Cellulitis of left lower limb: Secondary | ICD-10-CM | POA: Diagnosis not present

## 2022-05-08 DIAGNOSIS — M25571 Pain in right ankle and joints of right foot: Secondary | ICD-10-CM | POA: Diagnosis not present

## 2022-05-08 LAB — CREATININE, SERUM
Creatinine, Ser: 0.81 mg/dL (ref 0.44–1.00)
GFR, Estimated: >60 mL/min (ref >60)

## 2022-05-08 MED ORDER — DOXYCYCLINE HYCLATE 100 MG PO TABS
100.0000 mg | ORAL_TABLET | Freq: Two times a day (BID) | ORAL | Status: DC
  Administered 2022-05-08 – 2022-05-09 (×3): 100 mg via ORAL
  Filled 2022-05-08 (×3): qty 1

## 2022-05-08 NOTE — Progress Notes (Signed)
   Subjective/Chief Complaint: Feeling better.    Objective: Vital signs in last 24 hours: Temp:  [97.8 F (36.6 C)-98.6 F (37 C)] 97.8 F (36.6 C) (10/30 0618) Pulse Rate:  [97-104] 98 (10/30 0618) Resp:  [18] 18 (10/30 0618) BP: (106-117)/(69-77) 117/71 (10/30 0618) SpO2:  [94 %-100 %] 98 % (10/30 0618) Last BM Date : 05/07/22  Intake/Output from previous day: 10/29 0701 - 10/30 0700 In: 1530 [P.O.:1080; IV Piggyback:450] Out: 1860 [Urine:1860] Intake/Output this shift: Total I/O In: 400 [P.O.:400] Out: -   PE: GI: Soft, Left inguinal wound is still erythematous, but less, less indurations.  Less purulent drainage.  Currently packed.  Less tender  Lab Results:  Recent Labs    05/06/22 0450  WBC 6.0  HGB 11.2*  HCT 34.3*  PLT 138*   BMET Recent Labs    05/06/22 0450 05/08/22 0431  NA 139  --   K 4.1  --   CL 106  --   CO2 28  --   GLUCOSE 94  --   BUN 14  --   CREATININE 0.85 0.81  CALCIUM 9.2  --    PT/INR No results for input(s): "LABPROT", "INR" in the last 72 hours. ABG No results for input(s): "PHART", "HCO3" in the last 72 hours.  Invalid input(s): "PCO2", "PO2"  Studies/Results: No results found.  Anti-infectives: Anti-infectives (From admission, onward)    Start     Dose/Rate Route Frequency Ordered Stop   05/07/22 1400  ceFAZolin (ANCEF) IVPB 1 g/50 mL premix        1 g 100 mL/hr over 30 Minutes Intravenous Every 8 hours 05/07/22 1309     05/06/22 1500  cefTRIAXone (ROCEPHIN) 1 g in sodium chloride 0.9 % 100 mL IVPB  Status:  Discontinued        1 g 200 mL/hr over 30 Minutes Intravenous Every 24 hours 05/06/22 1337 05/07/22 1309   05/03/22 0500  vancomycin (VANCOREADY) IVPB 750 mg/150 mL        750 mg 150 mL/hr over 60 Minutes Intravenous Every 24 hours 05/02/22 0042     05/01/22 2100  vancomycin (VANCOREADY) IVPB 1250 mg/250 mL        1,250 mg 166.7 mL/hr over 90 Minutes Intravenous  Once 05/01/22 2054 05/02/22 0327        Assessment/Plan: Left inguinal abscess after trauma to the area - continue BID packing with iodoform - continue abx for cellulitis  - culture with MRSA  - no plans for surgical intervention at this time.  Arrangements for placement to proceed are ok from our standpoint.  Patient informed me she would be going to SNF at discharge.  Defer to primary service     FEN - Regular VTE - SCD's ID - Vanc,  Admit - TRH service   CKD IIIb PVD - ok to continue plavix  Macrocytic anemia   LOS: 5 days    Henreitta Cea 05/08/2022

## 2022-05-08 NOTE — Care Management Important Message (Signed)
Important Message  Patient Details IM Letter given Name: [name] [name] MRN: [id number] Date of Birth: 1940-11-05   Medicare Important Message Given:  Yes     Kerin Salen 05/08/2022, 11:16 AM
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
## 2022-05-08 NOTE — Progress Notes (Signed)
Mobility Specialist - Progress Note   05/08/22 1636  Mobility  Activity Stood at bedside  Level of Assistance Standby assist, set-up cues, supervision of patient - no hands on  Assistive Device Front wheel walker  Distance Ambulated (ft) 0 ft  Activity Response Tolerated well  Mobility Referral Yes  $Mobility charge 1 Mobility   Pt received in bed and agreeable to stand EOB due to problems with her ankle. Pt very sore in ankle but still motivated to move which resulted in a few steps & a few sit-to-stands. Pt to bed after session with all needs met.      [facility]
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
## 2022-05-08 NOTE — Progress Notes (Signed)
Pharmacy Antibiotic Note  [name] [name] is a 81 y.o. adult admitted on 05/01/2022 with purulent cellulitis, purulent left inguinal abscess after trauma to the area .  Pharmacy has been consulted for Vancomycin dosing.  Plan: Continue Vancomycin '750mg'$  IV q24h to target AUC 400-550.  Estimated AUC 445. Continue Cefazolin per MD.  F/u transition to oral ABX   Height: 5' (152.4 cm) Weight: 64.4 kg (142 lb) IBW/kg (Calculated) : 45.5  Temp (24hrs), Avg:98.2 F (36.8 C), Min:97.8 F (36.6 C), Max:98.6 F (37 C)  Recent Labs  Lab 05/01/22 1748 05/02/22 0535 05/03/22 0415 05/04/22 0422 05/06/22 0450 05/08/22 0431  WBC 10.8* 8.2 7.2  --  6.0  --   CREATININE 1.01* 1.01* 1.06* 1.00 0.85 0.81     Estimated Creatinine Clearance (by C-G formula based on SCr of 0.81 mg/dL) Female: 44.9 mL/min Female: 55.5 mL/min    Allergies  Allergen Reactions   Statins    Gabapentin Itching    Was deleted since patient was taking, but now experiencing itching again.    Statins Swelling, Rash and Other (See Comments)    Swelling involving tongue; also causes muscle pain    Antimicrobials this admission: 10/23 Vancomycin >> 10/28 CTX>10/29  10/29 Cefazolin >>    Dose adjustments this admission: 10/26 MRSA + 10/27 wound cx (after abx) few SA, rare Klebsiella  Thank you for allowing pharmacy to be a part of this patient's care.  Gretta Arab PharmD, BCPS [facility] main pharmacy [phone] 05/08/2022 12:03 PM
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
## 2022-05-08 NOTE — Progress Notes (Signed)
Transition of Care (TOC) -30 day Note       Patient Details  Name: [name] [name] MRN:  [id number] Date of Birth:  [birth year]   [facility] CM/SW Contact  Name:  Lennart Pall, [place] Phone Number:  [phone] Date:  05/08/22 Time:  3151   MUST ID: [id number]   To Whom it May Concern:   Please be advised that the above patient will require a short-term nursing home stay, anticipated 30 days or less rehabilitation and strengthening. The plan is for return home.
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
## 2022-05-09 ENCOUNTER — Inpatient Hospital Stay (HOSPITAL_COMMUNITY): Payer: Medicare HMO

## 2022-05-09 DIAGNOSIS — L0291 Cutaneous abscess, unspecified: Secondary | ICD-10-CM | POA: Diagnosis not present

## 2022-05-09 DIAGNOSIS — L03116 Cellulitis of left lower limb: Secondary | ICD-10-CM | POA: Diagnosis not present

## 2022-05-09 MED ORDER — DOXYCYCLINE HYCLATE 100 MG PO TABS: 100.0000 mg | ORAL_TABLET | Freq: Two times a day (BID) | ORAL | 0 refills | Status: AC

## 2022-05-09 MED ORDER — HYDROCODONE-ACETAMINOPHEN 5-325 MG PO TABS: 1.0000 | ORAL_TABLET | Freq: Four times a day (QID) | ORAL | 0 refills | Status: DC | PRN

## 2022-05-09 MED ORDER — CEPHALEXIN 500 MG PO CAPS: 500.0000 mg | ORAL_CAPSULE | Freq: Four times a day (QID) | ORAL | 0 refills | Status: AC

## 2022-05-09 MED ORDER — TRAMADOL HCL 50 MG PO TABS: 50.0000 mg | ORAL_TABLET | Freq: Four times a day (QID) | ORAL | 0 refills | Status: DC | PRN

## 2022-05-09 NOTE — Progress Notes (Signed)
Mobility Specialist - Progress Note   05/09/22 1153  Mobility  Activity Ambulated with assistance in hallway;Transferred to/from [facility]  Level of Assistance Contact guard assist, steadying assist  Assistive Device Front wheel walker  Distance Ambulated (ft) 25 ft  Activity Response Tolerated well  Mobility Referral Yes  $Mobility charge 1 Mobility   Pt received in bed and agreeable to mobility. Assisted pt to [facility] for BM. Pt stated she felt weak durng ambulation which prompted a short session. Pt to bed after session with all needs met & call bell in reach.  [facility]

## 2022-05-09 NOTE — TOC Transition Note (Signed)
Transition of Care [facility]) - CM/SW Discharge Note   Patient Details  Name: [name] [name] MRN: [id number] Date of Birth: 10-11-1940  Transition of Care [facility]) CM/SW Contact:  Lennart Pall, LCSW Phone Number: 05/09/2022, 3:19 PM   Clinical Narrative:     Have received insurance authorization for SNF and pt / grandson have chosen [facility] who can admit pt today.  Pt is medically cleared for dc.  PTAR called at 3:10pm.  RN to call report to [phone].  No further TOC needs.  Final next level of care: [place] Barriers to Discharge: Barriers Resolved   Patient Goals and CMS Choice Patient states their goals for this hospitalization and ongoing recovery are:: return home following SNF      Discharge Placement   Existing PASRR number confirmed : 05/08/22          Patient [facility] bed at: [facility] Patient to be transferred to facility by: [place] Name of family member notified: grandson, Josh Patient and family notified of of transfer: 05/09/22  Discharge Plan and Services In-house Referral: Clinical Social Work   Post Acute Care Choice: [facility] [place]                               Social Determinants of Health (SDOH) Interventions Food Insecurity Interventions: Inpatient TOC Housing Interventions: Inpatient TOC Transportation Interventions: PTAR [facility]) Utilities Interventions: Intervention Not Indicated   Readmission Risk Interventions    05/09/2022    3:18 PM 05/08/2022    1:31 PM  Readmission Risk Prevention Plan  Post Dischage Appt Complete   Medication Screening Complete   Transportation Screening  Complete  PCP or Specialist Appt within 5-7 Days  Complete  Home Care Screening  Complete  Medication Review (RN CM)  Complete

## 2022-05-09 NOTE — Discharge Instructions (Addendum)
Pack wound twice a day with packing strips.  Remove packing to get in the shower and then pack when she has gotten out of the shower and patted the area dry.  May stop packing the wound when it has healed well enough that there is minimal space to pack.   Please review all of you discharge paperwork on the day of discharge and be sure you have all of your prescribed medications.  Please request your Primary MD to go over all Hospital Tests and Procedure/Radiological results at the follow up Please get all Hospital records sent to your primary MD by signing hospital release before you go home.   In some cases, there will be blood work, cultures and biopsy results pending at the time of your discharge. Please request that your primary care M.D. goes through all the records of your hospital data and follows up on these results.  Please take all your medications with you for your next visit with your Primary MD   Please request your Primary MD to go over all hospital tests and procedure/radiological results at the follow up, please ask your Primary MD to get all Hospital records sent to his/her office.   You must read complete instructions/literature along with all the possible adverse reactions/side effects for all the Medicines you take and that have been prescribed to you. Take any new Medicines after you have completely understood and accpet all the possible adverse reactions/side effects.    Do not drive or operate heavy machinery when taking Pain medications.    Do not take more than prescribed Pain, Sleep and Anxiety Medications  If you have smoked or chewed Tobacco  in the last 2 yrs please stop smoking, stop any regular Alcohol  and or any Recreational drug use.   Wear Seat belts while driving.   If you had Pneumonia or Lung problems at the Hospital: Please get a 2 view Chest X ray done in 6-8 weeks after hospital discharge or sooner if instructed by your Primary MD.   If you have  Congestive Heart Failure: Please call your Cardiologist or Primary MD anytime you have any of the following symptoms:  1) 3 pound weight gain in 24 hours or 5 pounds in 1 week  2) shortness of breath, with or without a dry hacking cough  3) swelling in the hands, feet or stomach  4) if you have to sleep on extra pillows at night in order to breathe 5) Follow cardiac low salt diet and 1.5 lit/day fluid restriction.   If you have Diabetes Accuchecks 4 times/day- once on AM empty stomach and then before each meal. Log in all results and show them to your primary doctor at your next visit. If any glucose reading is under 60 or above 400 call your primary MD immediately.   If you have Seizure/Convulsions/Epilepsy: Please do not drive, operate heavy machinery, participate in activities at heights or participate in high speed sports until you have seen by Primary MD or a Neurologist and advised to do so again. Per [facility] statutes, patients with seizures are not allowed to drive until they have been seizure-free for six months.  Use caution when using heavy equipment or power tools. Avoid working on ladders or at heights. Take showers instead of baths. Ensure the water temperature is not too high on the home water heater. Do not go swimming alone. Do not lock yourself in a room alone (i.e. bathroom). When caring for infants or  small children, sit down when holding, feeding, or changing them to minimize risk of injury to the child in the event you have a seizure. Maintain good sleep hygiene. Avoid alcohol.    If you had Gastrointestinal Bleeding: Please ask your Primary MD to check a complete blood count within one week of discharge or at your next visit. Your endoscopic/colonoscopic biopsies that are pending at the time of discharge, will also need to followed by your Primary MD.  Please note You were cared for by a hospitalist during your hospital stay. If you have any questions about your  discharge medications or the care you received while you were in the hospital after you are discharged, you can call the unit and asked to speak with the hospitalist on call if the hospitalist that took care of you is not available. Once you are discharged, your primary care physician will handle any further medical issues. Please note that NO REFILLS for any discharge medications will be authorized once you are discharged, as it is imperative that you return to your primary care physician (or establish a relationship with a primary care physician if you do not have one) for your aftercare needs so that they can reassess your need for medications and monitor your lab values.   You can reach the hospitalist office at phone [phone] or fax [phone]   If you do not have a primary care physician, you can call [phone] for a physician referral.
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
## 2022-05-10 LAB — AEROBIC/ANAEROBIC CULTURE W GRAM STAIN (SURGICAL/DEEP WOUND)

## 2022-07-22 ENCOUNTER — Inpatient Hospital Stay (HOSPITAL_COMMUNITY)
Admission: EM | Admit: 2022-07-22 | Discharge: 2022-07-26 | DRG: 872 | Disposition: A | Discharge status: Home Health | Payer: PPO | Attending: Internal Medicine | Admitting: Internal Medicine

## 2022-07-22 ENCOUNTER — Other Ambulatory Visit: Payer: Self-pay

## 2022-07-22 ENCOUNTER — Encounter (HOSPITAL_COMMUNITY): Payer: Self-pay

## 2022-07-22 ENCOUNTER — Emergency Department (HOSPITAL_COMMUNITY): Payer: PPO

## 2022-07-22 DIAGNOSIS — A4151 Sepsis due to Escherichia coli [E. coli]: Principal | ICD-10-CM | POA: Diagnosis present

## 2022-07-22 DIAGNOSIS — D509 Iron deficiency anemia, unspecified: Secondary | ICD-10-CM | POA: Diagnosis present

## 2022-07-22 DIAGNOSIS — N1832 Chronic kidney disease, stage 3b: Secondary | ICD-10-CM | POA: Diagnosis present

## 2022-07-22 DIAGNOSIS — D539 Nutritional anemia, unspecified: Secondary | ICD-10-CM | POA: Diagnosis present

## 2022-07-22 DIAGNOSIS — A419 Sepsis, unspecified organism: Secondary | ICD-10-CM | POA: Diagnosis not present

## 2022-07-22 DIAGNOSIS — R410 Disorientation, unspecified: Secondary | ICD-10-CM | POA: Diagnosis present

## 2022-07-22 DIAGNOSIS — R6520 Severe sepsis without septic shock: Secondary | ICD-10-CM | POA: Diagnosis present

## 2022-07-22 DIAGNOSIS — I129 Hypertensive chronic kidney disease with stage 1 through stage 4 chronic kidney disease, or unspecified chronic kidney disease: Secondary | ICD-10-CM | POA: Diagnosis present

## 2022-07-22 DIAGNOSIS — N3000 Acute cystitis without hematuria: Secondary | ICD-10-CM | POA: Diagnosis present

## 2022-07-22 DIAGNOSIS — Z66 Do not resuscitate: Secondary | ICD-10-CM | POA: Diagnosis present

## 2022-07-22 DIAGNOSIS — Z90710 Acquired absence of both cervix and uterus: Secondary | ICD-10-CM | POA: Diagnosis not present

## 2022-07-22 DIAGNOSIS — R197 Diarrhea, unspecified: Secondary | ICD-10-CM | POA: Diagnosis present

## 2022-07-22 DIAGNOSIS — B9620 Unspecified Escherichia coli [E. coli] as the cause of diseases classified elsewhere: Secondary | ICD-10-CM | POA: Diagnosis not present

## 2022-07-22 DIAGNOSIS — Z7982 Long term (current) use of aspirin: Secondary | ICD-10-CM | POA: Diagnosis not present

## 2022-07-22 DIAGNOSIS — K219 Gastro-esophageal reflux disease without esophagitis: Secondary | ICD-10-CM | POA: Diagnosis present

## 2022-07-22 DIAGNOSIS — I739 Peripheral vascular disease, unspecified: Secondary | ICD-10-CM | POA: Diagnosis present

## 2022-07-22 DIAGNOSIS — Z7902 Long term (current) use of antithrombotics/antiplatelets: Secondary | ICD-10-CM

## 2022-07-22 DIAGNOSIS — M353 Polymyalgia rheumatica: Secondary | ICD-10-CM | POA: Diagnosis present

## 2022-07-22 DIAGNOSIS — R7881 Bacteremia: Secondary | ICD-10-CM | POA: Diagnosis not present

## 2022-07-22 DIAGNOSIS — E7800 Pure hypercholesterolemia, unspecified: Secondary | ICD-10-CM | POA: Diagnosis present

## 2022-07-22 DIAGNOSIS — Z8249 Family history of ischemic heart disease and other diseases of the circulatory system: Secondary | ICD-10-CM

## 2022-07-22 DIAGNOSIS — R5383 Other fatigue: Secondary | ICD-10-CM | POA: Diagnosis present

## 2022-07-22 DIAGNOSIS — F32A Depression, unspecified: Secondary | ICD-10-CM | POA: Diagnosis present

## 2022-07-22 DIAGNOSIS — Z87891 Personal history of nicotine dependence: Secondary | ICD-10-CM | POA: Diagnosis not present

## 2022-07-22 DIAGNOSIS — Z808 Family history of malignant neoplasm of other organs or systems: Secondary | ICD-10-CM | POA: Diagnosis not present

## 2022-07-22 DIAGNOSIS — E876 Hypokalemia: Secondary | ICD-10-CM | POA: Diagnosis present

## 2022-07-22 DIAGNOSIS — G9341 Metabolic encephalopathy: Secondary | ICD-10-CM | POA: Diagnosis present

## 2022-07-22 DIAGNOSIS — N179 Acute kidney failure, unspecified: Secondary | ICD-10-CM | POA: Diagnosis present

## 2022-07-22 DIAGNOSIS — E669 Obesity, unspecified: Secondary | ICD-10-CM | POA: Diagnosis present

## 2022-07-22 DIAGNOSIS — Z807 Family history of other malignant neoplasms of lymphoid, hematopoietic and related tissues: Secondary | ICD-10-CM

## 2022-07-22 DIAGNOSIS — M069 Rheumatoid arthritis, unspecified: Secondary | ICD-10-CM | POA: Diagnosis present

## 2022-07-22 HISTORY — DX: Sepsis, unspecified organism: A41.9

## 2022-07-22 LAB — URINALYSIS, ROUTINE W REFLEX MICROSCOPIC
Bilirubin Urine: NEGATIVE
Glucose, UA: NEGATIVE mg/dL
Ketones, ur: 5 mg/dL — AB
Nitrite: POSITIVE — AB
Protein, ur: 100 mg/dL — AB
Specific Gravity, Urine: 1.015 (ref 1.005–1.030)
WBC, UA: >50 WBC/hpf — ABNORMAL HIGH (ref 0–5)
pH: 5 (ref 5.0–8.0)

## 2022-07-22 LAB — CK: Total CK: 142 U/L (ref 38–234)

## 2022-07-22 LAB — CBC WITH DIFFERENTIAL/PLATELET
Abs Immature Granulocytes: 0.02 10*3/uL (ref 0.00–0.07)
Basophils Absolute: 0 10*3/uL (ref 0.0–0.1)
Basophils Relative: 0 %
Eosinophils Absolute: 0 10*3/uL (ref 0.0–0.5)
Eosinophils Relative: 0 %
HCT: 30.7 % — ABNORMAL LOW (ref 36.0–46.0)
Hemoglobin: 10.4 g/dL — ABNORMAL LOW (ref 12.0–15.0)
Immature Granulocytes: 0 %
Lymphocytes Relative: 8 %
Lymphs Abs: 0.4 10*3/uL — ABNORMAL LOW (ref 0.7–4.0)
MCH: 34.8 pg — ABNORMAL HIGH (ref 26.0–34.0)
MCHC: 33.9 g/dL (ref 30.0–36.0)
MCV: 102.7 fL — ABNORMAL HIGH (ref 80.0–100.0)
Monocytes Absolute: 0.6 10*3/uL (ref 0.1–1.0)
Monocytes Relative: 12 %
Neutro Abs: 4.1 10*3/uL (ref 1.7–7.7)
Neutrophils Relative %: 80 %
Platelets: 88 10*3/uL — ABNORMAL LOW (ref 150–400)
RBC: 2.99 MIL/uL — ABNORMAL LOW (ref 3.87–5.11)
RDW: 14.1 % (ref 11.5–15.5)
WBC: 5.2 10*3/uL (ref 4.0–10.5)
nRBC: 0 % (ref 0.0–0.2)

## 2022-07-22 LAB — RAPID URINE DRUG SCREEN, HOSP PERFORMED
Amphetamines: NOT DETECTED
Barbiturates: NOT DETECTED
Benzodiazepines: NOT DETECTED
Cocaine: NOT DETECTED
Opiates: NOT DETECTED
Tetrahydrocannabinol: NOT DETECTED

## 2022-07-22 LAB — COMPREHENSIVE METABOLIC PANEL
ALT: 8 U/L (ref 0–44)
AST: 21 U/L (ref 15–41)
Albumin: 3.1 g/dL — ABNORMAL LOW (ref 3.5–5.0)
Alkaline Phosphatase: 44 U/L (ref 38–126)
Anion gap: 12 (ref 5–15)
BUN: 24 mg/dL — ABNORMAL HIGH (ref 8–23)
CO2: 23 mmol/L (ref 22–32)
Calcium: 8.3 mg/dL — ABNORMAL LOW (ref 8.9–10.3)
Chloride: 105 mmol/L (ref 98–111)
Creatinine, Ser: 1.49 mg/dL — ABNORMAL HIGH (ref 0.44–1.00)
GFR, Estimated: 35 mL/min — ABNORMAL LOW (ref >60)
Glucose, Bld: 96 mg/dL (ref 70–99)
Potassium: 3.3 mmol/L — ABNORMAL LOW (ref 3.5–5.1)
Sodium: 140 mmol/L (ref 135–145)
Total Bilirubin: 0.8 mg/dL (ref 0.3–1.2)
Total Protein: 6.2 g/dL — ABNORMAL LOW (ref 6.5–8.1)

## 2022-07-22 LAB — PROTIME-INR
INR: 1.2 (ref 0.8–1.2)
Prothrombin Time: 15.3 seconds — ABNORMAL HIGH (ref 11.4–15.2)

## 2022-07-22 LAB — APTT: aPTT: 40 seconds — ABNORMAL HIGH (ref 24–36)

## 2022-07-22 LAB — LACTIC ACID, PLASMA: Lactic Acid, Venous: 0.7 mmol/L (ref 0.5–1.9)

## 2022-07-22 MED ORDER — ACETAMINOPHEN 650 MG RE SUPP
650.0000 mg | Freq: Four times a day (QID) | RECTAL | Status: DC | PRN
  Administered 2022-07-22: 650 mg via RECTAL
  Filled 2022-07-22: qty 1

## 2022-07-22 MED ORDER — SODIUM CHLORIDE 0.9 % IV SOLN: 1.0000 g | Freq: Once | INTRAVENOUS | Status: DC

## 2022-07-22 MED ORDER — ONDANSETRON HCL 4 MG/2ML IJ SOLN: 4.0000 mg | Freq: Four times a day (QID) | INTRAMUSCULAR | Status: DC | PRN

## 2022-07-22 MED ORDER — SODIUM CHLORIDE 0.9 % IV SOLN: INTRAVENOUS | Status: DC

## 2022-07-22 MED ORDER — POTASSIUM CHLORIDE 10 MEQ/100ML IV SOLN
10.0000 meq | INTRAVENOUS | Status: AC
  Administered 2022-07-22 – 2022-07-23 (×2): 10 meq via INTRAVENOUS
  Filled 2022-07-22 (×2): qty 100

## 2022-07-22 MED ORDER — ACETAMINOPHEN 325 MG PO TABS
650.0000 mg | ORAL_TABLET | Freq: Four times a day (QID) | ORAL | Status: DC | PRN
  Administered 2022-07-23 – 2022-07-26 (×7): 650 mg via ORAL
  Filled 2022-07-22 (×7): qty 2

## 2022-07-22 MED ORDER — POTASSIUM CHLORIDE 10 MEQ/100ML IV SOLN
10.0000 meq | INTRAVENOUS | Status: AC
  Administered 2022-07-22: 10 meq via INTRAVENOUS
  Filled 2022-07-22: qty 100

## 2022-07-22 MED ORDER — IOHEXOL 350 MG/ML SOLN
60.0000 mL | Freq: Once | INTRAVENOUS | Status: AC | PRN
  Administered 2022-07-22: 60 mL via INTRAVENOUS

## 2022-07-22 MED ORDER — SODIUM CHLORIDE 0.9 % IV SOLN
2.0000 g | INTRAVENOUS | Status: DC
  Administered 2022-07-22 – 2022-07-25 (×4): 2 g via INTRAVENOUS
  Filled 2022-07-22 (×4): qty 20

## 2022-07-22 MED ORDER — ONDANSETRON HCL 4 MG PO TABS: 4.0000 mg | ORAL_TABLET | Freq: Four times a day (QID) | ORAL | Status: DC | PRN

## 2022-07-22 MED ORDER — LACTATED RINGERS IV BOLUS
1000.0000 mL | Freq: Once | INTRAVENOUS | Status: AC
  Administered 2022-07-22: 1000 mL via INTRAVENOUS
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
## 2022-07-22 NOTE — H&P (Signed)
History and Physical    [name] [name] JJH:[id number] DOB: [birth year] DOA: 07/22/2022  I have briefly reviewed the patient's prior medical records in [place]  PCP: [name], MD  Patient coming from: home (has caregivers per family but lives alone)  Chief Complaint: AMS  HPI: [name] [name] is a 82 y.o. adult with medical history significant of HTN, CKD stage 3b, anemia.   History is limited as patient is confused and was brought in by caregivers who are not available, nor has any documentation been done by ER doctor who spoke with them.  [name] was called but he was not privy to any information except 2 days ago she seemed like her normal self on the phone.  Triage starts that at 9 AM she presented with slurred speech and confusion when h/h came to the house.  Her BP was 90/58.  There was concern for potential accidental overmedication but unclear why.  There was also concern for a fall but patient does not recall if she did.   In the ER, her urine was found to be infected.  CT scan of abdomen and pelvis was done.  Ct head is pending.   K was low.  No WBC count, Hgb 10.4 and plts 88.  Cr 1.49 (baseline of 1)  Review of Systems: As per HPI otherwise 10 point review of systems negative.   Past Medical History:  Diagnosis Date   Anxiety    Arthritis    RA   Carpal tunnel syndrome, bilateral    Chronic kidney disease    CKD stage III, absence of left kidney   CKD (chronic kidney disease) stage 3, GFR 30-59 ml/min ([place]) 09/06/2019   Congenital absence of one kidney    Pt has right kidney only   Depression    Dyslipidemia 09/06/2019   Essential hypertension 09/06/2019   GERD (gastroesophageal reflux disease)    Headache    Heart murmur    Hypercholesteremia    Hypertension    MRSA bacteremia 10/29/2020   Peripheral vascular disease (HCC)    a. s/p R external iliac stent '06 with angioplasty '13 (Dr. Benjamine Sprague) b. 01/2016: s/p PTA/stenting in L common iliac artery (Dr. Gwenlyn Found)    Pneumonia of right lower lobe due to infectious organism 11/11/2017   Polymyalgia rheumatica ([place])    Psoriasis    Sepsis due to pneumonia ([place]) 11/21/2020   Sepsis with acute hypoxic respiratory failure ([place]) 09/01/2020   Sleep apnea    Tremor 09/06/2019    Past Surgical History:  Procedure Laterality Date   ABDOMINAL HYSTERECTOMY  1963   Partial,  Due to bleeding after delivery   ANTERIOR CERVICAL DECOMP/DISCECTOMY FUSION  04/19/2012   Procedure: ANTERIOR CERVICAL DECOMPRESSION/DISCECTOMY FUSION 2 LEVELS;  Surgeon: Winfield Cunas, MD;  Location: [facility];  Service: Neurosurgery;  Laterality: N/A;  Cervical four-five,Cervical five-six anterior cervical decompression with fusion plating and bonegraft possible posterior cervical decompression   APPENDECTOMY     CARPAL TUNNEL RELEASE  1990   CERVICAL FUSION  04/19/2012   EYE SURGERY     ILIAC ARTERY STENT     PERIPHERAL VASCULAR CATHETERIZATION N/A 01/31/2016   Procedure: Abdominal Aortogram;  Surgeon: [name], MD;  Location: [facility];  Service: Cardiovascular;  Laterality: N/A;   PERIPHERAL VASCULAR CATHETERIZATION Bilateral 01/31/2016   Procedure: Lower Extremity Angiography;  Surgeon: [name], MD;  Location: [facility] [facility];  Service: Cardiovascular;  Laterality: Bilateral;  PERIPHERAL VASCULAR CATHETERIZATION Left 01/31/2016   Procedure: Peripheral Vascular Intervention;  Surgeon: [name], MD;  Location: [facility] CV LAB;  Service: Cardiovascular;  Laterality: Left;  ILIAC   TEE WITHOUT CARDIOVERSION N/A 11/01/2020   Procedure: TRANSESOPHAGEAL ECHOCARDIOGRAM (TEE);  Surgeon: Sueanne Margarita, MD;  Location: [facility];  Service: Cardiovascular;  Laterality: N/A;   TEE WITHOUT CARDIOVERSION N/A 11/25/2020   Procedure: TRANSESOPHAGEAL ECHOCARDIOGRAM (TEE);  Surgeon: Sueanne Margarita, MD;  Location: [facility];  Service: Cardiovascular;  Laterality: N/A;   TONSILLECTOMY       reports that she  quit smoking about 12 years ago. Her smoking use included cigarettes. She has a 40.00 pack-year smoking history. She has never used smokeless tobacco. She reports that she does not drink alcohol and does not use drugs.  Allergies  Allergen Reactions   Statins    Gabapentin Itching    Was deleted since patient was taking, but now experiencing itching again.    Statins Swelling, Rash and Other (See Comments)    Swelling involving tongue; also causes muscle pain    Family History  Problem Relation Age of Onset   Angina Mother    Congestive Heart Failure Mother    Heart attack Father    Melanoma Brother    Congestive Heart Failure Brother    Heart disease Brother    Cancer Sister        Lymphoma   Neuropathy Sister    Brain cancer Sister    Osteoarthritis Daughter     Prior to Admission medications   Medication Sig Start Date End Date Taking? Authorizing Provider  acetaminophen (TYLENOL) 500 MG tablet Take 500 mg by mouth every 8 (eight) hours as needed for moderate pain.    [provider]  amLODipine (NORVASC) 2.5 MG tablet Take 1 tablet (2.5 mg total) by mouth daily. 11/05/20   Antonieta Pert, MD  busPIRone (BUSPAR) 10 MG tablet Take 10 mg by mouth 3 (three) times daily. 04/08/22   [provider]  clopidogrel (PLAVIX) 75 MG tablet Take 1 tablet (75 mg total) by mouth every morning. 02/18/18   [name], MD  divalproex (DEPAKOTE) 125 MG DR tablet Take 250 mg by mouth 2 (two) times daily. 08/12/19   [provider]  gabapentin (NEURONTIN) 100 MG capsule Take 200 mg by mouth 3 (three) times daily.    [provider]  HYDROcodone-acetaminophen (NORCO/VICODIN) 5-325 MG tablet Take 1 tablet by mouth every 6 (six) hours as needed for moderate pain or severe pain (to be given 30 mini prior to dresssing changes). 05/09/22   Debbe Odea, MD  pantoprazole (PROTONIX) 40 MG tablet Take 40 mg by mouth daily. 08/21/19   [provider]    Physical  Exam: Vitals:   07/22/22 1530 07/22/22 1545 07/22/22 1600 07/22/22 1615  BP: 116/74 (!) 134/116 (!) 119/90 (!) 150/113  Pulse: 85 86 88 (!) 114  Resp: 15 18 (!) 22 (!) 23  Temp:      TempSrc:      SpO2: 94% 96% 96% 100%  Weight:      Height:          Constitutional: elderly female in distress- tremulous (appears to be rigors) ENMT: Mucous membranes are dry Neck: normal, supple, no masses, no thyromegaly Respiratory: diminished Cardiovascular: tachy, no murmurs / rubs / gallops. s.  Abdomen: no tenderness, no masses palpated. Bowel sounds positive.  Musculoskeletal: no clubbing / cyanosis. Normal muscle tone.  Skin: chronic skin  changes in b/l LE Neurologic: confused at times, unable to assess Psychiatric: awake, alert   Labs on Admission: I have personally reviewed following labs and imaging studies  CBC: Recent Labs  Lab 07/22/22 1146  WBC 5.2  NEUTROABS 4.1  HGB 10.4*  HCT 30.7*  MCV 102.7*  PLT 88*   Basic Metabolic Panel: Recent Labs  Lab 07/22/22 1146  NA 140  K 3.3*  CL 105  CO2 23  GLUCOSE 96  BUN 24*  CREATININE 1.49*  CALCIUM 8.3*   GFR: Estimated Creatinine Clearance (by C-G formula based on SCr of 1.49 mg/dL (H)) Female: 24 mL/min (A) Female: 29.6 mL/min (A) Liver Function Tests: Recent Labs  Lab 07/22/22 1146  AST 21  ALT 8  ALKPHOS 44  BILITOT 0.8  PROT 6.2*  ALBUMIN 3.1*   No results for input(s): "LIPASE", "AMYLASE" in the last 168 hours. No results for input(s): "AMMONIA" in the last 168 hours. Coagulation Profile: Recent Labs  Lab 07/22/22 1146  INR 1.2   Cardiac Enzymes: No results for input(s): "CKTOTAL", "CKMB", "CKMBINDEX", "TROPONINI" in the last 168 hours. BNP (last 3 results) No results for input(s): "PROBNP" in the last 8760 hours. HbA1C: No results for input(s): "HGBA1C" in the last 72 hours. CBG: No results for input(s): "GLUCAP" in the last 168 hours. Lipid Profile: No results for input(s): "CHOL", "HDL",  "LDLCALC", "TRIG", "CHOLHDL", "LDLDIRECT" in the last 72 hours. Thyroid Function Tests: No results for input(s): "TSH", "T4TOTAL", "FREET4", "T3FREE", "THYROIDAB" in the last 72 hours. Anemia Panel: No results for input(s): "VITAMINB12", "FOLATE", "FERRITIN", "TIBC", "IRON", "RETICCTPCT" in the last 72 hours. Urine analysis:    Component Value Date/Time   COLORURINE YELLOW 07/22/2022 1052   APPEARANCEUR CLOUDY (A) 07/22/2022 1052   LABSPEC 1.015 07/22/2022 1052   PHURINE 5.0 07/22/2022 1052   GLUCOSEU NEGATIVE 07/22/2022 1052   HGBUR MODERATE (A) 07/22/2022 1052   BILIRUBINUR NEGATIVE 07/22/2022 1052   BILIRUBINUR negative 02/18/2020 0906   BILIRUBINUR NEG 06/08/2015 1447   KETONESUR 5 (A) 07/22/2022 1052   PROTEINUR 100 (A) 07/22/2022 1052   UROBILINOGEN 0.2 02/18/2020 0906   UROBILINOGEN 0.2 10/19/2014 1619   NITRITE POSITIVE (A) 07/22/2022 1052   LEUKOCYTESUR LARGE (A) 07/22/2022 1052     Radiological Exams on Admission: CT CHEST ABDOMEN PELVIS W CONTRAST  Result Date: 07/22/2022 CLINICAL DATA:  Sepsis. EXAM: CT CHEST, ABDOMEN, AND PELVIS WITH CONTRAST TECHNIQUE: Multidetector CT imaging of the chest, abdomen and pelvis was performed following the standard protocol during bolus administration of intravenous contrast. RADIATION DOSE REDUCTION: This exam was performed according to the departmental dose-optimization program which includes automated exposure control, adjustment of the mA and/or kV according to patient size and/or use of iterative reconstruction technique. CONTRAST:  4m OMNIPAQUE IOHEXOL 350 MG/ML SOLN COMPARISON:  Chest CT dated 11/21/2020. CT abdomen and pelvis dated 10/27/2020. FINDINGS: CT CHEST FINDINGS Cardiovascular: No thoracic aortic aneurysm or evidence of aortic dissection. Scattered aortic atherosclerosis. No pericardial effusion. Three-vessel coronary artery calcifications. Mediastinum/Nodes: No mass or enlarged lymph nodes are seen within the mediastinum or  perihilar regions. Esophagus appears normal. Trachea and central bronchi are unremarkable. Lungs/Pleura: Mild bibasilar atelectasis. Lungs are otherwise clear. No pleural effusion or pneumothorax. Musculoskeletal: Degenerative spondylosis of the scoliotic thoracic spine, mild to moderate in degree. No acute findings. CT ABDOMEN PELVIS FINDINGS Hepatobiliary: No focal liver abnormality is seen. Gallbladder is unremarkable. No bile duct dilatation. Pancreas: Unremarkable. No pancreatic ductal dilatation or surrounding inflammatory changes. Spleen: Mildly prominent in size  but otherwise unremarkable. Adrenals/Urinary Tract: Adrenal glands appear normal. LEFT kidney is atrophic. RIGHT kidney is unremarkable without suspicious mass, stone or hydronephrosis. No ureteral or bladder calculi are identified. Bladder walls appear mildly thickened circumferentially, but characterization is limited by bladder decompression. Stomach/Bowel: No dilated large or small bowel loops. Scattered diverticulosis of the descending and sigmoid colon but no focal inflammatory change is seen to suggest acute diverticulitis. No evidence of a focal or generalized bowel wall inflammation. Stomach is unremarkable, partially decompressed. Appendix is not seen but there are no inflammatory changes about the cecum to suggest acute appendicitis. Vascular/Lymphatic: Aortic atherosclerosis. No abdominal aortic aneurysm. No acute-appearing vascular abnormality. No enlarged lymph nodes are seen in the abdomen or pelvis. Reproductive: No mass or free fluid. Other: No free fluid or abscess collection. No free intraperitoneal air. Musculoskeletal: Degenerative spondylosis of the slightly scoliotic lumbar spine, at least moderate in degree. No acute findings. IMPRESSION: 1. Bladder walls appear mildly thickened circumferentially, but characterization is limited by bladder decompression. Recommend correlation with urinalysis to exclude cystitis. 2. Otherwise,  no acute findings within the chest, abdomen or pelvis. No other possible source for sepsis identified. 3. Colonic diverticulosis without evidence of acute diverticulitis. 4. Three-vessel coronary artery calcifications. 5. LEFT renal atrophy. 6. Degenerative spondylosis of the scoliotic thoracic and lumbar spine, at least moderate in degree. Electronically Signed   By: Franki Cabot M.D.   On: 07/22/2022 14:28   DG Chest Port 1 View  Result Date: 07/22/2022 CLINICAL DATA:  Questionable sepsis - evaluate for abnormality EXAM: PORTABLE CHEST 1 VIEW COMPARISON:  05/01/2022 FINDINGS: The heart size and mediastinal contours are within normal limits. Aortic atherosclerosis. Small streaky opacity at the periphery of the right lung base. Minimally increased left basilar interstitial markings. No pleural effusion or pneumothorax. The visualized skeletal structures are unremarkable. IMPRESSION: Small streaky opacity at the periphery of the right lung base, which may represent atelectasis or pneumonia. Electronically Signed   By: Davina Poke D.O.   On: 07/22/2022 11:26     Assessment/Plan Principal Problem:   Sepsis ([place])    Sepsis due to UTI  -Small streaky opacity at the periphery of the right lung base, which may represent atelectasis or pneumonia-- CT scan appears to be atelectasis -U/A suggestive of UTI -culture pending (has grown kleb and enterobacter in the past) -IV Abx -blood cultures done -IVF -progressive care -COVID swabs on backorder but appears that UTI is her source of sepsis  Macrocytic anemia -monitor   Hypokalemia - Replace and recheck tomorrow   AKI on CKD stage IIIb - daily labs -IVF   Await med reconciliation to be done  -also will need to clarify if patient wears 2L O2 at home   DVT prophylaxis: hold until after head CT  Code Status: DNR (form at bedside)  Family Communication: called grandson but he was unclear what happened, daughter is in the  hospital Disposition Plan: pending improvement Consults called: none    Admission status: inpt      Geradine Girt [facility]   How to contact the [facility] Attending or Consulting provider [place] or covering provider during after hours 7P -7A, for this patient?  Check the care team in [facility] and look for a) attending/consulting [facility] provider listed and b) the [facility] team listed Log into [email or web address] and use [place]'s universal password to access. If you do not have the password, please contact the hospital operator. Locate the [facility] provider you are looking for  under [facility] and page to a number that you can be directly reached. If you still have difficulty reaching the provider, please page the [facility] (Director on Call) for the Hospitalists listed on amion for assistance.  07/22/2022, 4:59 PM

## 2022-07-22 NOTE — ED Provider Notes (Signed)
Blood pressure (!) 119/90, pulse 88, temperature 99.2 F (37.3 C), resp. rate (!) 22, height 5' (1.524 m), weight 64.4 kg, SpO2 96 %.  Assuming care from Dr. Matilde Sprang.  In short, [name] [name] is a 82 y.o. adult with a chief complaint of Altered Mental Status .  Refer to the original H&P for additional details.  The current plan of care is to follow up on UA and reassess.  Patient with evidence of urinary tract infection.  This likely explains her fever, weakness, confusion.  The history that I am getting is mixed and I am unsure if she had an actual fall versus was too weak to get up.  She has had a contrasted CT image earlier but this did not include the head.  I have added this on although realize there is limitation and since she has already had contrast.  Would be able to see, perhaps, a large subdural so we will move forward with this and I ordered antibiotics and discussed the case with the hospitalist who will be down to admit.   Discussed patient's case with TRH to request admission. Patient and family (if present) updated with plan.   I reviewed all nursing notes, vitals, pertinent old records, EKGs, labs, imaging (as available).    Margette Fast, MD 07/22/22 1700

## 2022-07-23 DIAGNOSIS — N3000 Acute cystitis without hematuria: Secondary | ICD-10-CM | POA: Diagnosis not present

## 2022-07-23 DIAGNOSIS — R7881 Bacteremia: Secondary | ICD-10-CM

## 2022-07-23 LAB — COMPREHENSIVE METABOLIC PANEL
ALT: 9 U/L (ref 0–44)
AST: 19 U/L (ref 15–41)
Albumin: 2.7 g/dL — ABNORMAL LOW (ref 3.5–5.0)
Alkaline Phosphatase: 40 U/L (ref 38–126)
Anion gap: 11 (ref 5–15)
BUN: 21 mg/dL (ref 8–23)
CO2: 21 mmol/L — ABNORMAL LOW (ref 22–32)
Calcium: 7.6 mg/dL — ABNORMAL LOW (ref 8.9–10.3)
Chloride: 108 mmol/L (ref 98–111)
Creatinine, Ser: 1.26 mg/dL — ABNORMAL HIGH (ref 0.44–1.00)
GFR, Estimated: 42 mL/min — ABNORMAL LOW (ref >60)
Glucose, Bld: 100 mg/dL — ABNORMAL HIGH (ref 70–99)
Potassium: 3.7 mmol/L (ref 3.5–5.1)
Sodium: 140 mmol/L (ref 135–145)
Total Bilirubin: 0.6 mg/dL (ref 0.3–1.2)
Total Protein: 5.7 g/dL — ABNORMAL LOW (ref 6.5–8.1)

## 2022-07-23 LAB — BLOOD CULTURE ID PANEL (REFLEXED) - BCID2

## 2022-07-23 LAB — CBC
HCT: 27.5 % — ABNORMAL LOW (ref 36.0–46.0)
Hemoglobin: 9.2 g/dL — ABNORMAL LOW (ref 12.0–15.0)
MCH: 34.7 pg — ABNORMAL HIGH (ref 26.0–34.0)
MCHC: 33.5 g/dL (ref 30.0–36.0)
MCV: 103.8 fL — ABNORMAL HIGH (ref 80.0–100.0)
Platelets: 84 10*3/uL — ABNORMAL LOW (ref 150–400)
RBC: 2.65 MIL/uL — ABNORMAL LOW (ref 3.87–5.11)
RDW: 14.6 % (ref 11.5–15.5)
WBC: 6 10*3/uL (ref 4.0–10.5)
nRBC: 0 % (ref 0.0–0.2)

## 2022-07-23 MED ORDER — PANTOPRAZOLE SODIUM 40 MG PO TBEC
40.0000 mg | DELAYED_RELEASE_TABLET | Freq: Every day | ORAL | Status: DC
  Administered 2022-07-23 – 2022-07-26 (×4): 40 mg via ORAL
  Filled 2022-07-23 (×4): qty 1

## 2022-07-23 MED ORDER — BUSPIRONE HCL 5 MG PO TABS
10.0000 mg | ORAL_TABLET | Freq: Three times a day (TID) | ORAL | Status: DC
  Administered 2022-07-23 – 2022-07-26 (×10): 10 mg via ORAL
  Filled 2022-07-23 (×2): qty 2
  Filled 2022-07-23: qty 1
  Filled 2022-07-23 (×6): qty 2
  Filled 2022-07-23: qty 1

## 2022-07-23 MED ORDER — CLOPIDOGREL BISULFATE 75 MG PO TABS
75.0000 mg | ORAL_TABLET | Freq: Every morning | ORAL | Status: DC
  Administered 2022-07-23 – 2022-07-26 (×4): 75 mg via ORAL
  Filled 2022-07-23 (×4): qty 1

## 2022-07-23 MED ORDER — DIVALPROEX SODIUM 250 MG PO DR TAB
250.0000 mg | DELAYED_RELEASE_TABLET | Freq: Two times a day (BID) | ORAL | Status: DC
  Administered 2022-07-23 – 2022-07-26 (×7): 250 mg via ORAL
  Filled 2022-07-23 (×9): qty 1

## 2022-07-23 MED ORDER — GABAPENTIN 100 MG PO CAPS
100.0000 mg | ORAL_CAPSULE | Freq: Once | ORAL | Status: AC
  Administered 2022-07-23: 100 mg via ORAL
  Filled 2022-07-23: qty 1

## 2022-07-23 NOTE — Progress Notes (Signed)
PHARMACY - PHYSICIAN COMMUNICATION CRITICAL VALUE ALERT - BLOOD CULTURE IDENTIFICATION (BCID)  [name] [name] is an 82 y.o. adult who presented to [facility] on 07/22/2022 with a chief complaint of AMS/urosepsis  Assessment:   Blood cultures growing E. coli  Name of physician (or Provider) Contacted:  Dr. Myna Hidalgo  Current antibiotics:  Rocephin  Changes to prescribed antibiotics recommended:  No changes needed  Results for orders placed or performed during the hospital encounter of 07/22/22  Blood Culture ID Panel (Reflexed) (Collected: 07/22/2022  1:16 PM)  Result Value Ref Range   Enterococcus faecalis NOT DETECTED NOT DETECTED   Enterococcus Faecium NOT DETECTED NOT DETECTED   Listeria monocytogenes NOT DETECTED NOT DETECTED   Staphylococcus species NOT DETECTED NOT DETECTED   Staphylococcus aureus (BCID) NOT DETECTED NOT DETECTED   Staphylococcus epidermidis NOT DETECTED NOT DETECTED   Staphylococcus lugdunensis NOT DETECTED NOT DETECTED   Streptococcus species NOT DETECTED NOT DETECTED   Streptococcus agalactiae NOT DETECTED NOT DETECTED   Streptococcus pneumoniae NOT DETECTED NOT DETECTED   Streptococcus pyogenes NOT DETECTED NOT DETECTED   A.calcoaceticus-baumannii NOT DETECTED NOT DETECTED   Bacteroides fragilis NOT DETECTED NOT DETECTED   Enterobacterales DETECTED (A) NOT DETECTED   Enterobacter cloacae complex NOT DETECTED NOT DETECTED   Escherichia coli DETECTED (A) NOT DETECTED   Klebsiella aerogenes NOT DETECTED NOT DETECTED   Klebsiella oxytoca NOT DETECTED NOT DETECTED   Klebsiella pneumoniae NOT DETECTED NOT DETECTED   Proteus species NOT DETECTED NOT DETECTED   Salmonella species NOT DETECTED NOT DETECTED   Serratia marcescens NOT DETECTED NOT DETECTED   Haemophilus influenzae NOT DETECTED NOT DETECTED   Neisseria meningitidis NOT DETECTED NOT DETECTED   Pseudomonas aeruginosa NOT DETECTED NOT DETECTED   Stenotrophomonas maltophilia NOT DETECTED NOT DETECTED    Candida albicans NOT DETECTED NOT DETECTED   Candida auris NOT DETECTED NOT DETECTED   Candida glabrata NOT DETECTED NOT DETECTED   Candida krusei NOT DETECTED NOT DETECTED   Candida parapsilosis NOT DETECTED NOT DETECTED   Candida tropicalis NOT DETECTED NOT DETECTED   Cryptococcus neoformans/gattii NOT DETECTED NOT DETECTED   CTX-M ESBL NOT DETECTED NOT DETECTED   Carbapenem resistance IMP NOT DETECTED NOT DETECTED   Carbapenem resistance KPC NOT DETECTED NOT DETECTED   Carbapenem resistance NDM NOT DETECTED NOT DETECTED   Carbapenem resist OXA 48 LIKE NOT DETECTED NOT DETECTED   Carbapenem resistance VIM NOT DETECTED NOT DETECTED    Caryl Pina 07/23/2022  1:40 AM

## 2022-07-23 NOTE — Evaluation (Addendum)
Clinical/Bedside Swallow Evaluation Patient Details  Name: [name] [name] MRN: [id number] Date of Birth: May 15, 1941  Today's Date: 07/23/2022 Time: SLP Start Time (ACUTE ONLY): 0947 SLP Stop Time (ACUTE ONLY): 1003 SLP Time Calculation (min) (ACUTE ONLY): 16 min  Past Medical History:  Past Medical History:  Diagnosis Date   Anxiety    Arthritis    RA   Carpal tunnel syndrome, bilateral    Chronic kidney disease    CKD stage III, absence of left kidney   CKD (chronic kidney disease) stage 3, GFR 30-59 ml/min ([place]) 09/06/2019   Congenital absence of one kidney    Pt has right kidney only   Depression    Dyslipidemia 09/06/2019   Essential hypertension 09/06/2019   GERD (gastroesophageal reflux disease)    Headache    Heart murmur    Hypercholesteremia    Hypertension    MRSA bacteremia 10/29/2020   Peripheral vascular disease (HCC)    a. s/p R external iliac stent '06 with angioplasty '13 (Dr. Benjamine Sprague) b. 01/2016: s/p PTA/stenting in L common iliac artery (Dr. Gwenlyn Found)   Pneumonia of right lower lobe due to infectious organism 11/11/2017   Polymyalgia rheumatica ([place])    Psoriasis    Sepsis due to pneumonia ([place]) 11/21/2020   Sepsis with acute hypoxic respiratory failure ([place]) 09/01/2020   Sleep apnea    Tremor 09/06/2019   Past Surgical History:  Past Surgical History:  Procedure Laterality Date   ABDOMINAL HYSTERECTOMY  1963   Partial,  Due to bleeding after delivery   ANTERIOR CERVICAL DECOMP/DISCECTOMY FUSION  04/19/2012   Procedure: ANTERIOR CERVICAL DECOMPRESSION/DISCECTOMY FUSION 2 LEVELS;  Surgeon: Winfield Cunas, MD;  Location: [facility];  Service: Neurosurgery;  Laterality: N/A;  Cervical four-five,Cervical five-six anterior cervical decompression with fusion plating and bonegraft possible posterior cervical decompression   APPENDECTOMY     CARPAL TUNNEL RELEASE  1990   CERVICAL FUSION  04/19/2012   EYE SURGERY     ILIAC ARTERY STENT     PERIPHERAL VASCULAR  CATHETERIZATION N/A 01/31/2016   Procedure: Abdominal Aortogram;  Surgeon: [name], MD;  Location: [facility];  Service: Cardiovascular;  Laterality: N/A;   PERIPHERAL VASCULAR CATHETERIZATION Bilateral 01/31/2016   Procedure: Lower Extremity Angiography;  Surgeon: [name], MD;  Location: [facility];  Service: Cardiovascular;  Laterality: Bilateral;   PERIPHERAL VASCULAR CATHETERIZATION Left 01/31/2016   Procedure: Peripheral Vascular Intervention;  Surgeon: [name], MD;  Location: [facility];  Service: Cardiovascular;  Laterality: Left;  ILIAC   TEE WITHOUT CARDIOVERSION N/A 11/01/2020   Procedure: TRANSESOPHAGEAL ECHOCARDIOGRAM (TEE);  Surgeon: Sueanne Margarita, MD;  Location: [facility];  Service: Cardiovascular;  Laterality: N/A;   TEE WITHOUT CARDIOVERSION N/A 11/25/2020   Procedure: TRANSESOPHAGEAL ECHOCARDIOGRAM (TEE);  Surgeon: Sueanne Margarita, MD;  Location: [facility];  Service: Cardiovascular;  Laterality: N/A;   TONSILLECTOMY     HPI:  Pt is an 82 y.o. female who presented with  slurred speech and confusion. CT head negative. CXR 1/13: Small streaky opacity at the periphery of the right lung base, which may represent atelectasis or pneumonia. PMH: HTN, CKD stage 3b, anemia. MBS 09/27/12: cervical esophageal phase dysphagia; Suspected primary esophageal dysphagia. A regular texture diet with thin liquids recommended at that time without need for follow up.    Assessment / Plan / Recommendation  Clinical Impression  Pt was seen for bedside swallow evaluation and she denied a history  of dysphagia. Oral motor strength and ROM appeared grossly WFL and she presented with full dentures (fit was suboptimal due to her denture adhesive not being at hospital). She tolerated all solids and liquids without signs or symptoms of oropharyngeal dysphagia. A regular texture diet with thin liquids is recommended at this time. SLP will follow briefly to ensure  tolerance. SLP Visit Diagnosis: Dysphagia, unspecified (R13.10)    Aspiration Risk  Mild aspiration risk    Diet Recommendation Regular;Thin liquid   Liquid Administration via: Cup;Straw Medication Administration: Whole meds with liquid Supervision: Staff to assist with self feeding Postural Changes: Seated upright at 90 degrees    Other  Recommendations Oral Care Recommendations: Oral care BID    Recommendations for follow up therapy are one component of a multi-disciplinary discharge planning process, led by the attending physician.  Recommendations may be updated based on patient status, additional functional criteria and insurance authorization.  Follow up Recommendations No SLP follow up      Assistance Recommended at Discharge    Functional Status Assessment Patient has not had a recent decline in their functional status  Frequency and Duration min 1 x/week  1 week       Prognosis        Swallow Study   General Date of Onset: 07/23/22 HPI: Pt is an 82 y.o. female who presented with  slurred speech and confusion. CT head negative. CXR 1/13: Small streaky opacity at the periphery of the right lung base, which may represent atelectasis or pneumonia. PMH: HTN, CKD stage 3b, anemia. MBS 09/27/12: cervical esophageal phase dysphagia; Suspected primary esophageal dysphagia. A regular texture diet with thin liquids recommended at that time without need for follow up. Type of Study: Bedside Swallow Evaluation Previous Swallow Assessment: none Diet Prior to this Study: NPO Temperature Spikes Noted: No Respiratory Status: Room air History of Recent Intubation: No Behavior/Cognition: Alert;Cooperative;Pleasant mood Oral Cavity Assessment: Within Functional Limits Oral Care Completed by SLP: No Vision: Functional for self-feeding Self-Feeding Abilities: Needs assist Patient Positioning: Upright in bed Baseline Vocal Quality: Normal Volitional Cough: Strong Volitional Swallow:  Able to elicit    Oral/Motor/Sensory Function Overall Oral Motor/Sensory Function: Within functional limits   Ice Chips Ice chips: Within functional limits Presentation: Spoon   Thin Liquid Thin Liquid: Within functional limits Presentation: Straw    Nectar Thick Nectar Thick Liquid: Not tested   Honey Thick Honey Thick Liquid: Not tested   Puree Puree: Within functional limits Presentation: Spoon   Solid     Solid: Within functional limits Presentation: [place] I. Hardin Negus, [place], [place] Office number [phone]  [name] [name] 07/23/2022,10:03 AM

## 2022-07-23 NOTE — Progress Notes (Signed)
PROGRESS NOTE    [name] [name]  HKV:[id number] DOB: 11-Sep-1940 DOA: 07/22/2022 PCP: [name], MD    Brief Narrative:  [name] [name] is a 82 y.o. adult with medical history significant of HTN, CKD stage 3b, anemia.   History is limited as patient is confused and was brought in by caregivers who are not available, nor has any documentation been done by ER doctor who spoke with them.  [name] was called but he was not privy to any information except 2 days ago she seemed like her normal self on the phone.  Triage starts that at 9 AM she presented with slurred speech and confusion when h/h came to the house.  Her BP was 90/58.  There was concern for potential accidental overmedication but unclear why.  There was also concern for a fall but patient does not recall if she did.    In the ER, her urine was found to be infected.  CT scan of abdomen and pelvis was done.  Ct head is pending.   K was low.  No WBC count, Hgb 10.4 and plts 88.  Cr 1.49 (baseline of 1)   Assessment and Plan: Sepsis due to UTI/gram negative bacteremia -Small streaky opacity at the periphery of the right lung base, which may represent atelectasis or pneumonia-- CT scan appears to be atelectasis -U/A suggestive of UTI -culture pending (has grown kleb and enterobacter in the past) -IV Abx -blood cultures done and gram negative bacteria -IVF -progressive care    Macrocytic anemia -monitor   Hypokalemia - Replaced   AKI on CKD stage IIIb - improved   -Resume home meds as able -SLP eval   DVT prophylaxis: SCDs Start: 07/22/22 1654    Code Status: DNR Family Communication: daughter updated  Disposition Plan:  Level of care: Progressive Status is: Inpatient Remains inpatient appropriate    Consultants:  none   Subjective: Still c/o being cold  Objective: Vitals:   07/23/22 0530 07/23/22 0600 07/23/22 0630 07/23/22 0656  BP: (!) 100/51 (!) 107/48 (!) 110/50   Pulse: 72 70 69   Resp: '19 20 18    '$ Temp:    97.6 F (36.4 C)  TempSrc:    Oral  SpO2: 97% 98% 96%   Weight:      Height:        Intake/Output Summary (Last 24 hours) at 07/23/2022 0811 Last data filed at 07/23/2022 0113 Gross per 24 hour  Intake 189.04 ml  Output --  Net 189.04 ml   Filed Weights   07/22/22 1044  Weight: 64.4 kg    Examination:   General: Appearance:     Overweight adult in no acute distress     Lungs:     respirations unlabored, on O2  Heart:    Normal heart rate.    MS:   All extremities are intact.    Neurologic:   Awake, alert- less tremulous       Data Reviewed: I have personally reviewed following labs and imaging studies  CBC: Recent Labs  Lab 07/22/22 1146 07/23/22 0408  WBC 5.2 6.0  NEUTROABS 4.1  --   HGB 10.4* 9.2*  HCT 30.7* 27.5*  MCV 102.7* 103.8*  PLT 88* 84*   Basic Metabolic Panel: Recent Labs  Lab 07/22/22 1146 07/23/22 0408  NA 140 140  K 3.3* 3.7  CL 105 108  CO2 23 21*  GLUCOSE 96 100*  BUN 24* 21  CREATININE 1.49* 1.26*  CALCIUM 8.3*  7.6*   GFR: Estimated Creatinine Clearance (by C-G formula based on SCr of 1.26 mg/dL (H)) Female: 28.4 mL/min (A) Female: 35.1 mL/min (A) Liver Function Tests: Recent Labs  Lab 07/22/22 1146 07/23/22 0408  AST 21 19  ALT 8 9  ALKPHOS 44 40  BILITOT 0.8 0.6  PROT 6.2* 5.7*  ALBUMIN 3.1* 2.7*   No results for input(s): "LIPASE", "AMYLASE" in the last 168 hours. No results for input(s): "AMMONIA" in the last 168 hours. Coagulation Profile: Recent Labs  Lab 07/22/22 1146  INR 1.2   Cardiac Enzymes: Recent Labs  Lab 07/22/22 1459  CKTOTAL 142   BNP (last 3 results) No results for input(s): "PROBNP" in the last 8760 hours. HbA1C: No results for input(s): "HGBA1C" in the last 72 hours. CBG: No results for input(s): "GLUCAP" in the last 168 hours. Lipid Profile: No results for input(s): "CHOL", "HDL", "LDLCALC", "TRIG", "CHOLHDL", "LDLDIRECT" in the last 72 hours. Thyroid Function Tests: No  results for input(s): "TSH", "T4TOTAL", "FREET4", "T3FREE", "THYROIDAB" in the last 72 hours. Anemia Panel: No results for input(s): "VITAMINB12", "FOLATE", "FERRITIN", "TIBC", "IRON", "RETICCTPCT" in the last 72 hours. Sepsis Labs: Recent Labs  Lab 07/22/22 1205  LATICACIDVEN 0.7    Recent Results (from the past 240 hour(s))  Blood Culture (routine x 2)     Status: None (Preliminary result)   Collection Time: 07/22/22  1:16 PM   Specimen: BLOOD LEFT FOREARM  Result Value Ref Range Status   Specimen Description BLOOD LEFT FOREARM  Final   Special Requests   Final    BOTTLES DRAWN AEROBIC AND ANAEROBIC Blood Culture adequate volume   Culture  Setup Time   Final    GRAM NEGATIVE RODS IN BOTH AEROBIC AND ANAEROBIC BOTTLES CRITICAL RESULT CALLED TO, READ BACK BY AND VERIFIED WITH: PHARMD G. ABBOTT 07/23/22 @ 0136 BY AB    Culture   Final    NO GROWTH < 24 HOURS Performed at [facility], [place] [address] [place], [place] [address]    Report Status PENDING  Incomplete  Blood Culture ID Panel (Reflexed)     Status: Abnormal   Collection Time: 07/22/22  1:16 PM  Result Value Ref Range Status   Enterococcus faecalis NOT DETECTED NOT DETECTED Final   Enterococcus Faecium NOT DETECTED NOT DETECTED Final   Listeria monocytogenes NOT DETECTED NOT DETECTED Final   Staphylococcus species NOT DETECTED NOT DETECTED Final   Staphylococcus aureus (BCID) NOT DETECTED NOT DETECTED Final   Staphylococcus epidermidis NOT DETECTED NOT DETECTED Final   Staphylococcus lugdunensis NOT DETECTED NOT DETECTED Final   Streptococcus species NOT DETECTED NOT DETECTED Final   Streptococcus agalactiae NOT DETECTED NOT DETECTED Final   Streptococcus pneumoniae NOT DETECTED NOT DETECTED Final   Streptococcus pyogenes NOT DETECTED NOT DETECTED Final   A.calcoaceticus-baumannii NOT DETECTED NOT DETECTED Final   Bacteroides fragilis NOT DETECTED NOT DETECTED Final   Enterobacterales DETECTED (A) NOT  DETECTED Final    Comment: Enterobacterales represent a large order of gram negative bacteria, not a single organism. CRITICAL RESULT CALLED TO, READ BACK BY AND VERIFIED WITH: PHARMD G. ABBOTT 07/23/22 @ 0136 BY AB    Enterobacter cloacae complex NOT DETECTED NOT DETECTED Final   Escherichia coli DETECTED (A) NOT DETECTED Final    Comment: CRITICAL RESULT CALLED TO, READ BACK BY AND VERIFIED WITH: PHARMD G. ABBOTT 07/23/22 @ 0136 BY AB    Klebsiella aerogenes NOT DETECTED NOT DETECTED Final   Klebsiella oxytoca NOT DETECTED  NOT DETECTED Final   Klebsiella pneumoniae NOT DETECTED NOT DETECTED Final   Proteus species NOT DETECTED NOT DETECTED Final   Salmonella species NOT DETECTED NOT DETECTED Final   Serratia marcescens NOT DETECTED NOT DETECTED Final   Haemophilus influenzae NOT DETECTED NOT DETECTED Final   Neisseria meningitidis NOT DETECTED NOT DETECTED Final   Pseudomonas aeruginosa NOT DETECTED NOT DETECTED Final   Stenotrophomonas maltophilia NOT DETECTED NOT DETECTED Final   Candida albicans NOT DETECTED NOT DETECTED Final   Candida auris NOT DETECTED NOT DETECTED Final   Candida glabrata NOT DETECTED NOT DETECTED Final   Candida krusei NOT DETECTED NOT DETECTED Final   Candida parapsilosis NOT DETECTED NOT DETECTED Final   Candida tropicalis NOT DETECTED NOT DETECTED Final   Cryptococcus neoformans/gattii NOT DETECTED NOT DETECTED Final   CTX-M ESBL NOT DETECTED NOT DETECTED Final   Carbapenem resistance IMP NOT DETECTED NOT DETECTED Final   Carbapenem resistance KPC NOT DETECTED NOT DETECTED Final   Carbapenem resistance NDM NOT DETECTED NOT DETECTED Final   Carbapenem resist OXA 48 LIKE NOT DETECTED NOT DETECTED Final   Carbapenem resistance VIM NOT DETECTED NOT DETECTED Final    Comment: Performed at [facility], [address] [place], [place] [address]  Blood Culture (routine x 2)     Status: None (Preliminary result)   Collection Time: 07/22/22  4:21 PM    Specimen: BLOOD RIGHT ARM  Result Value Ref Range Status   Specimen Description BLOOD RIGHT ARM  Final   Special Requests   Final    BOTTLES DRAWN AEROBIC AND ANAEROBIC Blood Culture results may not be optimal due to an inadequate volume of blood received in culture bottles   Culture  Setup Time   Final    GRAM NEGATIVE RODS IN BOTH AEROBIC AND ANAEROBIC BOTTLES CRITICAL VALUE NOTED.  VALUE IS CONSISTENT WITH PREVIOUSLY REPORTED AND CALLED VALUE.    Culture   Final    NO GROWTH < 24 HOURS Performed at [facility], [place] [address] [place], [place] [address]    Report Status PENDING  Incomplete         Radiology Studies: CT Head Wo Contrast  Result Date: 07/22/2022 CLINICAL DATA:  Head trauma, moderate-severe EXAM: CT HEAD WITHOUT CONTRAST TECHNIQUE: Contiguous axial images were obtained from the base of the skull through the vertex without intravenous contrast. RADIATION DOSE REDUCTION: This exam was performed according to the departmental dose-optimization program which includes automated exposure control, adjustment of the mA and/or kV according to patient size and/or use of iterative reconstruction technique. COMPARISON:  11/21/2020 FINDINGS: Brain: There is atrophy and chronic small vessel disease changes. No acute intracranial abnormality. Specifically, no hemorrhage, hydrocephalus, mass lesion, acute infarction, or significant intracranial injury. Vascular: No hyperdense vessel or unexpected calcification. Skull: No acute calvarial abnormality. Sinuses/Orbits: No acute findings Other: None IMPRESSION: Atrophy, chronic microvascular disease. No acute intracranial abnormality. Electronically Signed   By: Rolm Baptise M.D.   On: 07/22/2022 17:25   CT CHEST ABDOMEN PELVIS W CONTRAST  Result Date: 07/22/2022 CLINICAL DATA:  Sepsis. EXAM: CT CHEST, ABDOMEN, AND PELVIS WITH CONTRAST TECHNIQUE: Multidetector CT imaging of the chest, abdomen and pelvis was performed following the  standard protocol during bolus administration of intravenous contrast. RADIATION DOSE REDUCTION: This exam was performed according to the departmental dose-optimization program which includes automated exposure control, adjustment of the mA and/or kV according to patient size and/or use of iterative reconstruction technique. CONTRAST:  90m OMNIPAQUE  IOHEXOL 350 MG/ML SOLN COMPARISON:  Chest CT dated 11/21/2020. CT abdomen and pelvis dated 10/27/2020. FINDINGS: CT CHEST FINDINGS Cardiovascular: No thoracic aortic aneurysm or evidence of aortic dissection. Scattered aortic atherosclerosis. No pericardial effusion. Three-vessel coronary artery calcifications. Mediastinum/Nodes: No mass or enlarged lymph nodes are seen within the mediastinum or perihilar regions. Esophagus appears normal. Trachea and central bronchi are unremarkable. Lungs/Pleura: Mild bibasilar atelectasis. Lungs are otherwise clear. No pleural effusion or pneumothorax. Musculoskeletal: Degenerative spondylosis of the scoliotic thoracic spine, mild to moderate in degree. No acute findings. CT ABDOMEN PELVIS FINDINGS Hepatobiliary: No focal liver abnormality is seen. Gallbladder is unremarkable. No bile duct dilatation. Pancreas: Unremarkable. No pancreatic ductal dilatation or surrounding inflammatory changes. Spleen: Mildly prominent in size but otherwise unremarkable. Adrenals/Urinary Tract: Adrenal glands appear normal. LEFT kidney is atrophic. RIGHT kidney is unremarkable without suspicious mass, stone or hydronephrosis. No ureteral or bladder calculi are identified. Bladder walls appear mildly thickened circumferentially, but characterization is limited by bladder decompression. Stomach/Bowel: No dilated large or small bowel loops. Scattered diverticulosis of the descending and sigmoid colon but no focal inflammatory change is seen to suggest acute diverticulitis. No evidence of a focal or generalized bowel wall inflammation. Stomach is  unremarkable, partially decompressed. Appendix is not seen but there are no inflammatory changes about the cecum to suggest acute appendicitis. Vascular/Lymphatic: Aortic atherosclerosis. No abdominal aortic aneurysm. No acute-appearing vascular abnormality. No enlarged lymph nodes are seen in the abdomen or pelvis. Reproductive: No mass or free fluid. Other: No free fluid or abscess collection. No free intraperitoneal air. Musculoskeletal: Degenerative spondylosis of the slightly scoliotic lumbar spine, at least moderate in degree. No acute findings. IMPRESSION: 1. Bladder walls appear mildly thickened circumferentially, but characterization is limited by bladder decompression. Recommend correlation with urinalysis to exclude cystitis. 2. Otherwise, no acute findings within the chest, abdomen or pelvis. No other possible source for sepsis identified. 3. Colonic diverticulosis without evidence of acute diverticulitis. 4. Three-vessel coronary artery calcifications. 5. LEFT renal atrophy. 6. Degenerative spondylosis of the scoliotic thoracic and lumbar spine, at least moderate in degree. Electronically Signed   By: Franki Cabot M.D.   On: 07/22/2022 14:28   DG Chest Port 1 View  Result Date: 07/22/2022 CLINICAL DATA:  Questionable sepsis - evaluate for abnormality EXAM: PORTABLE CHEST 1 VIEW COMPARISON:  05/01/2022 FINDINGS: The heart size and mediastinal contours are within normal limits. Aortic atherosclerosis. Small streaky opacity at the periphery of the right lung base. Minimally increased left basilar interstitial markings. No pleural effusion or pneumothorax. The visualized skeletal structures are unremarkable. IMPRESSION: Small streaky opacity at the periphery of the right lung base, which may represent atelectasis or pneumonia. Electronically Signed   By: Davina Poke D.O.   On: 07/22/2022 11:26        Scheduled Meds: Continuous Infusions:  sodium chloride 100 mL/hr at 07/23/22 0330    cefTRIAXone (ROCEPHIN)  IV Stopped (07/22/22 1855)     LOS: 1 day    Time spent: 45 minutes spent on chart review, discussion with nursing staff, consultants, updating family and interview/physical exam; more than 50% of that time was spent in counseling and/or coordination of care.    Geradine Girt, DO [facility] Available via Epic secure chat 7am-7pm After these hours, please refer to coverage provider listed on [email or web address] 07/23/2022, 8:11 AM

## 2022-07-23 NOTE — ED Notes (Signed)
ED TO INPATIENT HANDOFF REPORT  ED Nurse Name and Phone #: Anderson Malta [phone]  S Name/Age/Gender [name] [name] 82 y.o. adult Room/Bed: 009C/009C  Code Status   Code Status: DNR  Home/SNF/Other Home Patient oriented to: self, place, time, and situation Is this baseline? Yes   Triage Complete: Triage complete  Chief Complaint Sepsis [facility]) [A41.9]  Triage Note When they talked to her yesterday they stated she sounded her self with no slurred speech 0900 this morning at 9am she had slurred speech and seemed confused then spoke to home health today who talked to caretaker and the pt is not trying to harm herself but overmedicated. 90-91% RA 94-95% on 2L lives at home alone.    90/58 500 ml bolus given   Pt states she feel last night off the toilet unsure if hit her head takes plavix    Allergies Allergies  Allergen Reactions   Statins    Gabapentin Itching    Was deleted since patient was taking, but now experiencing itching again.    Statins Swelling, Rash and Other (See Comments)    Swelling involving tongue; also causes muscle pain    Level of Care/Admitting Diagnosis ED Disposition     ED Disposition  Admit   Condition  --   [place]: [place] [[id number]]  Level of Care: Progressive [102]  Admit to Progressive based on following criteria: MULTISYSTEM THREATS such as stable sepsis, metabolic/electrolyte imbalance with or without encephalopathy that is responding to early treatment.  May admit patient to [facility] or [facility] if equivalent level of care is available:: Yes  Covid Evaluation: Asymptomatic - no recent exposure (last 10 days) testing not required  Diagnosis: Sepsis [facility]) [[id number]]  Admitting Physician: Geradine Girt [place]  Attending Physician: Geradine Girt [[id number]]  Certification:: I certify this patient will need inpatient services for at least 2 midnights  Estimated Length of Stay: 3           B Medical/Surgery History Past Medical History:  Diagnosis Date   Anxiety    Arthritis    RA   Carpal tunnel syndrome, bilateral    Chronic kidney disease    CKD stage III, absence of left kidney   CKD (chronic kidney disease) stage 3, GFR 30-59 ml/min ([place]) 09/06/2019   Congenital absence of one kidney    Pt has right kidney only   Depression    Dyslipidemia 09/06/2019   Essential hypertension 09/06/2019   GERD (gastroesophageal reflux disease)    Headache    Heart murmur    Hypercholesteremia    Hypertension    MRSA bacteremia 10/29/2020   Peripheral vascular disease ([place])    a. s/p R external iliac stent '06 with angioplasty '13 (Dr. Benjamine Sprague) b. 01/2016: s/p PTA/stenting in L common iliac artery (Dr. Gwenlyn Found)   Pneumonia of right lower lobe due to infectious organism 11/11/2017   Polymyalgia rheumatica ([place])    Psoriasis    Sepsis due to pneumonia ([place]) 11/21/2020   Sepsis with acute hypoxic respiratory failure ([place]) 09/01/2020   Sleep apnea    Tremor 09/06/2019   Past Surgical History:  Procedure Laterality Date   ABDOMINAL HYSTERECTOMY  1963   Partial,  Due to bleeding after delivery   ANTERIOR CERVICAL DECOMP/DISCECTOMY FUSION  04/19/2012   Procedure: ANTERIOR CERVICAL DECOMPRESSION/DISCECTOMY FUSION 2 LEVELS;  Surgeon: Winfield Cunas, MD;  Location: [facility];  Service: Neurosurgery;  Laterality: N/A;  Cervical four-five,Cervical five-six anterior  cervical decompression with fusion plating and bonegraft possible posterior cervical decompression   APPENDECTOMY     CARPAL TUNNEL RELEASE  1990   CERVICAL FUSION  04/19/2012   EYE SURGERY     ILIAC ARTERY STENT     PERIPHERAL VASCULAR CATHETERIZATION N/A 01/31/2016   Procedure: Abdominal Aortogram;  Surgeon: [name], MD;  Location: [place] [facility];  Service: Cardiovascular;  Laterality: N/A;   PERIPHERAL VASCULAR CATHETERIZATION Bilateral 01/31/2016   Procedure: Lower Extremity Angiography;  Surgeon: [name], MD;  Location: [place] [facility];  Service: Cardiovascular;  Laterality: Bilateral;   PERIPHERAL VASCULAR CATHETERIZATION Left 01/31/2016   Procedure: Peripheral Vascular Intervention;  Surgeon: [name], MD;  Location: [place] [facility];  Service: Cardiovascular;  Laterality: Left;  ILIAC   TEE WITHOUT CARDIOVERSION N/A 11/01/2020   Procedure: TRANSESOPHAGEAL ECHOCARDIOGRAM (TEE);  Surgeon: Sueanne Margarita, MD;  Location: [place];  Service: Cardiovascular;  Laterality: N/A;   TEE WITHOUT CARDIOVERSION N/A 11/25/2020   Procedure: TRANSESOPHAGEAL ECHOCARDIOGRAM (TEE);  Surgeon: Sueanne Margarita, MD;  Location: [facility];  Service: Cardiovascular;  Laterality: N/A;   TONSILLECTOMY       A IV Location/Drains/Wounds Patient Lines/Drains/Airways Status     Active Line/Drains/Airways     Name Placement date Placement time Site Days   Peripheral IV 07/22/22 20 G Right Wrist 07/22/22  1030  Wrist  1   Peripheral IV 07/22/22 20 G Left Antecubital 07/22/22  1600  Antecubital  1   External Urinary Catheter 07/23/22  0402  --  less than 1   Wound / Incision (Open or Dehisced) 05/02/22 Laceration Thigh Anterior;Left;Proximal erythema, edema, puru;ent drainage 05/02/22  0504  Thigh  82            Intake/Output Last 24 hours  Intake/Output Summary (Last 24 hours) at 07/23/2022 1520 Last data filed at 07/23/2022 1105 Gross per 24 hour  Intake 189.04 ml  Output 2 ml  Net 187.04 ml    Labs/Imaging Results for orders placed or performed during the hospital encounter of 07/22/22 (from the past 48 hour(s))  Urinalysis, Routine w reflex microscopic     Status: Abnormal   Collection Time: 07/22/22 10:52 AM  Result Value Ref Range   Color, Urine YELLOW YELLOW   APPearance CLOUDY (A) CLEAR   Specific Gravity, Urine 1.015 1.005 - 1.030   pH 5.0 5.0 - 8.0   Glucose, UA NEGATIVE NEGATIVE mg/dL   Hgb urine dipstick MODERATE (A) NEGATIVE   Bilirubin Urine NEGATIVE NEGATIVE    Ketones, ur 5 (A) NEGATIVE mg/dL   Protein, ur 100 (A) NEGATIVE mg/dL   Nitrite POSITIVE (A) NEGATIVE   Leukocytes,Ua LARGE (A) NEGATIVE   RBC / HPF 0-5 0 - 5 RBC/hpf   WBC, UA >50 (H) 0 - 5 WBC/hpf   Bacteria, UA RARE (A) NONE SEEN   Squamous Epithelial / HPF 0-5 0 - 5 /HPF   WBC Clumps PRESENT     Comment: Performed at [facility], [address], [place], [place] [address]  Urine Culture     Status: Abnormal (Preliminary result)   Collection Time: 07/22/22 10:52 AM   Specimen: In/Out Cath Urine  Result Value Ref Range   Specimen Description IN/OUT CATH URINE    Special Requests NONE    Culture (A)     80,000 COLONIES/mL GRAM NEGATIVE RODS SUSCEPTIBILITIES TO FOLLOW Performed at [facility], [place] [address] [place], [place] [address]  Report Status PENDING   Rapid urine drug screen (hospital performed)     Status: None   Collection Time: 07/22/22 10:52 AM  Result Value Ref Range   Opiates NONE DETECTED NONE DETECTED   Cocaine NONE DETECTED NONE DETECTED   Benzodiazepines NONE DETECTED NONE DETECTED   Amphetamines NONE DETECTED NONE DETECTED   Tetrahydrocannabinol NONE DETECTED NONE DETECTED   Barbiturates NONE DETECTED NONE DETECTED    Comment: (NOTE) DRUG SCREEN FOR MEDICAL PURPOSES ONLY.  IF CONFIRMATION IS NEEDED FOR ANY PURPOSE, NOTIFY LAB WITHIN 5 DAYS.  LOWEST DETECTABLE LIMITS FOR URINE DRUG SCREEN Drug Class                     Cutoff (ng/mL) Amphetamine and metabolites    1000 Barbiturate and metabolites    200 Benzodiazepine                 200 Opiates and metabolites        300 Cocaine and metabolites        300 THC                            50 Performed at [facility], [place] [address], [place], [place] [address]   Comprehensive metabolic panel     Status: Abnormal   Collection Time: 07/22/22 11:46 AM  Result Value Ref Range   Sodium 140 135 - 145 mmol/L   Potassium 3.3 (L) 3.5 - 5.1 mmol/L   Chloride 105 98 - 111 mmol/L   CO2  23 22 - 32 mmol/L   Glucose, Bld 96 70 - 99 mg/dL    Comment: Glucose reference range applies only to samples taken after fasting for at least 8 hours.   BUN 24 (H) 8 - 23 mg/dL   Creatinine, Ser 1.49 (H) 0.44 - 1.00 mg/dL   Calcium 8.3 (L) 8.9 - 10.3 mg/dL   Total Protein 6.2 (L) 6.5 - 8.1 g/dL   Albumin 3.1 (L) 3.5 - 5.0 g/dL   AST 21 15 - 41 U/L   ALT 8 0 - 44 U/L   Alkaline Phosphatase 44 38 - 126 U/L   Total Bilirubin 0.8 0.3 - 1.2 mg/dL   GFR, Estimated 35 (L) >60 mL/min    Comment: (NOTE) Calculated using the CKD-EPI Creatinine Equation (2021)    Anion gap 12 5 - 15    Comment: Performed at [facility], [place] [address] [place], [place] [address]  CBC with Differential     Status: Abnormal   Collection Time: 07/22/22 11:46 AM  Result Value Ref Range   WBC 5.2 4.0 - 10.5 K/uL   RBC 2.99 (L) 3.87 - 5.11 MIL/uL   Hemoglobin 10.4 (L) 12.0 - 15.0 g/dL   HCT 30.7 (L) 36.0 - 46.0 %   MCV 102.7 (H) 80.0 - 100.0 fL   MCH 34.8 (H) 26.0 - 34.0 pg   MCHC 33.9 30.0 - 36.0 g/dL   RDW 14.1 11.5 - 15.5 %   Platelets 88 (L) 150 - 400 K/uL    Comment: Immature Platelet Fraction may be clinically indicated, consider ordering this additional test [id number] REPEATED TO VERIFY PLATELET COUNT CONFIRMED BY SMEAR    nRBC 0.0 0.0 - 0.2 %   Neutrophils Relative % 80 %   Neutro Abs 4.1 1.7 - 7.7 K/uL   Lymphocytes Relative 8 %   Lymphs Abs 0.4 (L) 0.7 - 4.0  K/uL   Monocytes Relative 12 %   Monocytes Absolute 0.6 0.1 - 1.0 K/uL   Eosinophils Relative 0 %   Eosinophils Absolute 0.0 0.0 - 0.5 K/uL   Basophils Relative 0 %   Basophils Absolute 0.0 0.0 - 0.1 K/uL   Immature Granulocytes 0 %   Abs Immature Granulocytes 0.02 0.00 - 0.07 K/uL    Comment: Performed at [facility] [address] [place], [place] [address]  Protime-INR     Status: Abnormal   Collection Time: 07/22/22 11:46 AM  Result Value Ref Range   Prothrombin Time 15.3 (H) 11.4 - 15.2 seconds   INR 1.2 0.8 -  1.2    Comment: (NOTE) INR goal varies based on device and disease states. Performed at [facility], [place] [address] [place], [place] [address]   APTT     Status: Abnormal   Collection Time: 07/22/22 11:46 AM  Result Value Ref Range   aPTT 40 (H) 24 - 36 seconds    Comment:        IF BASELINE aPTT IS ELEVATED, SUGGEST PATIENT RISK ASSESSMENT BE USED TO DETERMINE APPROPRIATE ANTICOAGULANT THERAPY. Performed at [facility], [place] [address], [place], [place] [address]   Lactic acid, plasma     Status: None   Collection Time: 07/22/22 12:05 PM  Result Value Ref Range   Lactic Acid, Venous 0.7 0.5 - 1.9 mmol/L    Comment: Performed at [facility] [address], [place], [place] [address]  Blood Culture (routine x 2)     Status: Abnormal (Preliminary result)   Collection Time: 07/22/22  1:16 PM   Specimen: BLOOD LEFT FOREARM  Result Value Ref Range   Specimen Description BLOOD LEFT FOREARM    Special Requests      BOTTLES DRAWN AEROBIC AND ANAEROBIC Blood Culture adequate volume   Culture  Setup Time      GRAM NEGATIVE RODS IN BOTH AEROBIC AND ANAEROBIC BOTTLES CRITICAL RESULT CALLED TO, READ BACK BY AND VERIFIED WITH: PHARMD G. ABBOTT 07/23/22 @ 0136 BY AB    Culture (A)     ESCHERICHIA COLI CULTURE REINCUBATED FOR BETTER GROWTH Performed at [facility], [place] [address], [place], [place] [address]    Report Status PENDING   Blood Culture ID Panel (Reflexed)     Status: Abnormal   Collection Time: 07/22/22  1:16 PM  Result Value Ref Range   Enterococcus faecalis NOT DETECTED NOT DETECTED   Enterococcus Faecium NOT DETECTED NOT DETECTED   Listeria monocytogenes NOT DETECTED NOT DETECTED   Staphylococcus species NOT DETECTED NOT DETECTED   Staphylococcus aureus (BCID) NOT DETECTED NOT DETECTED   Staphylococcus epidermidis NOT DETECTED NOT DETECTED   Staphylococcus lugdunensis NOT DETECTED NOT DETECTED   Streptococcus species NOT DETECTED NOT  DETECTED   Streptococcus agalactiae NOT DETECTED NOT DETECTED   Streptococcus pneumoniae NOT DETECTED NOT DETECTED   Streptococcus pyogenes NOT DETECTED NOT DETECTED   A.calcoaceticus-baumannii NOT DETECTED NOT DETECTED   Bacteroides fragilis NOT DETECTED NOT DETECTED   Enterobacterales DETECTED (A) NOT DETECTED    Comment: Enterobacterales represent a large order of gram negative bacteria, not a single organism. CRITICAL RESULT CALLED TO, READ BACK BY AND VERIFIED WITH: PHARMD G. ABBOTT 07/23/22 @ 0136 BY AB    Enterobacter cloacae complex NOT DETECTED NOT DETECTED   Escherichia coli DETECTED (A) NOT DETECTED    Comment: CRITICAL RESULT CALLED TO, READ BACK BY AND VERIFIED  WITH: PHARMD G. ABBOTT 07/23/22 @ 0136 BY AB    Klebsiella aerogenes NOT DETECTED NOT DETECTED   Klebsiella oxytoca NOT DETECTED NOT DETECTED   Klebsiella pneumoniae NOT DETECTED NOT DETECTED   Proteus species NOT DETECTED NOT DETECTED   Salmonella species NOT DETECTED NOT DETECTED   Serratia marcescens NOT DETECTED NOT DETECTED   Haemophilus influenzae NOT DETECTED NOT DETECTED   Neisseria meningitidis NOT DETECTED NOT DETECTED   Pseudomonas aeruginosa NOT DETECTED NOT DETECTED   Stenotrophomonas maltophilia NOT DETECTED NOT DETECTED   Candida albicans NOT DETECTED NOT DETECTED   Candida auris NOT DETECTED NOT DETECTED   Candida glabrata NOT DETECTED NOT DETECTED   Candida krusei NOT DETECTED NOT DETECTED   Candida parapsilosis NOT DETECTED NOT DETECTED   Candida tropicalis NOT DETECTED NOT DETECTED   Cryptococcus neoformans/gattii NOT DETECTED NOT DETECTED   CTX-M ESBL NOT DETECTED NOT DETECTED   Carbapenem resistance IMP NOT DETECTED NOT DETECTED   Carbapenem resistance KPC NOT DETECTED NOT DETECTED   Carbapenem resistance NDM NOT DETECTED NOT DETECTED   Carbapenem resist OXA 48 LIKE NOT DETECTED NOT DETECTED   Carbapenem resistance VIM NOT DETECTED NOT DETECTED    Comment: Performed at [facility], [place] [address] [place] [address], [place], [place] [address]  CK     Status: None   Collection Time: 07/22/22  2:59 PM  Result Value Ref Range   Total CK 142 38 - 234 U/L    Comment: Performed at [facility], [place] [address] [place], [place] [address]  Blood Culture (routine x 2)     Status: None (Preliminary result)   Collection Time: 07/22/22  4:21 PM   Specimen: BLOOD RIGHT ARM  Result Value Ref Range   Specimen Description BLOOD RIGHT ARM    Special Requests      BOTTLES DRAWN AEROBIC AND ANAEROBIC Blood Culture results may not be optimal due to an inadequate volume of blood received in culture bottles   Culture  Setup Time      GRAM NEGATIVE RODS IN BOTH AEROBIC AND ANAEROBIC BOTTLES CRITICAL VALUE NOTED.  VALUE IS CONSISTENT WITH PREVIOUSLY REPORTED AND CALLED VALUE.    Culture      NO GROWTH < 24 HOURS Performed at [facility], [place] [name] [place],  [address]    Report Status PENDING   Comprehensive metabolic panel     Status: Abnormal   Collection Time: 07/23/22  4:08 AM  Result Value Ref Range   Sodium 140 135 - 145 mmol/L   Potassium 3.7 3.5 - 5.1 mmol/L   Chloride 108 98 - 111 mmol/L   CO2 21 (L) 22 - 32 mmol/L   Glucose, Bld 100 (H) 70 - 99 mg/dL    Comment: Glucose reference range applies only to samples taken after fasting for at least 8 hours.   BUN 21 8 - 23 mg/dL   Creatinine, Ser 1.26 (H) 0.44 - 1.00 mg/dL   Calcium 7.6 (L) 8.9 - 10.3 mg/dL   Total Protein 5.7 (L) 6.5 - 8.1 g/dL   Albumin 2.7 (L) 3.5 - 5.0 g/dL   AST 19 15 - 41 U/L   ALT 9 0 - 44 U/L   Alkaline Phosphatase 40 38 - 126 U/L   Total Bilirubin 0.6 0.3 - 1.2 mg/dL   GFR, Estimated 42 (L) >60 mL/min    Comment: (NOTE) Calculated using the CKD-EPI Creatinine Equation (2021)    Anion gap 11 5 - 15  Comment: Performed at [facility], [place] [address], [place], [place] [address]  CBC     Status: Abnormal   Collection Time: 07/23/22  4:08 AM  Result Value Ref Range   WBC 6.0 4.0  - 10.5 K/uL   RBC 2.65 (L) 3.87 - 5.11 MIL/uL   Hemoglobin 9.2 (L) 12.0 - 15.0 g/dL   HCT 27.5 (L) 36.0 - 46.0 %   MCV 103.8 (H) 80.0 - 100.0 fL   MCH 34.7 (H) 26.0 - 34.0 pg   MCHC 33.5 30.0 - 36.0 g/dL   RDW 14.6 11.5 - 15.5 %   Platelets 84 (L) 150 - 400 K/uL    Comment: Immature Platelet Fraction may be clinically indicated, consider ordering this additional test [id number]    nRBC 0.0 0.0 - 0.2 %    Comment: Performed at [facility], [place] [address] [place], [place] [address]   CT Head Wo Contrast  Result Date: 07/22/2022 CLINICAL DATA:  Head trauma, moderate-severe EXAM: CT HEAD WITHOUT CONTRAST TECHNIQUE: Contiguous axial images were obtained from the base of the skull through the vertex without intravenous contrast. RADIATION DOSE REDUCTION: This exam was performed according to the departmental dose-optimization program which includes automated exposure control, adjustment of the mA and/or kV according to patient size and/or use of iterative reconstruction technique. COMPARISON:  11/21/2020 FINDINGS: Brain: There is atrophy and chronic small vessel disease changes. No acute intracranial abnormality. Specifically, no hemorrhage, hydrocephalus, mass lesion, acute infarction, or significant intracranial injury. Vascular: No hyperdense vessel or unexpected calcification. Skull: No acute calvarial abnormality. Sinuses/Orbits: No acute findings Other: None IMPRESSION: Atrophy, chronic microvascular disease. No acute intracranial abnormality. Electronically Signed   By: Rolm Baptise M.D.   On: 07/22/2022 17:25   CT CHEST ABDOMEN PELVIS W CONTRAST  Result Date: 07/22/2022 CLINICAL DATA:  Sepsis. EXAM: CT CHEST, ABDOMEN, AND PELVIS WITH CONTRAST TECHNIQUE: Multidetector CT imaging of the chest, abdomen and pelvis was performed following the standard protocol during bolus administration of intravenous contrast. RADIATION DOSE REDUCTION: This exam was performed according to the departmental  dose-optimization program which includes automated exposure control, adjustment of the mA and/or kV according to patient size and/or use of iterative reconstruction technique. CONTRAST:  22m OMNIPAQUE IOHEXOL 350 MG/ML SOLN COMPARISON:  Chest CT dated 11/21/2020. CT abdomen and pelvis dated 10/27/2020. FINDINGS: CT CHEST FINDINGS Cardiovascular: No thoracic aortic aneurysm or evidence of aortic dissection. Scattered aortic atherosclerosis. No pericardial effusion. Three-vessel coronary artery calcifications. Mediastinum/Nodes: No mass or enlarged lymph nodes are seen within the mediastinum or perihilar regions. Esophagus appears normal. Trachea and central bronchi are unremarkable. Lungs/Pleura: Mild bibasilar atelectasis. Lungs are otherwise clear. No pleural effusion or pneumothorax. Musculoskeletal: Degenerative spondylosis of the scoliotic thoracic spine, mild to moderate in degree. No acute findings. CT ABDOMEN PELVIS FINDINGS Hepatobiliary: No focal liver abnormality is seen. Gallbladder is unremarkable. No bile duct dilatation. Pancreas: Unremarkable. No pancreatic ductal dilatation or surrounding inflammatory changes. Spleen: Mildly prominent in size but otherwise unremarkable. Adrenals/Urinary Tract: Adrenal glands appear normal. LEFT kidney is atrophic. RIGHT kidney is unremarkable without suspicious mass, stone or hydronephrosis. No ureteral or bladder calculi are identified. Bladder walls appear mildly thickened circumferentially, but characterization is limited by bladder decompression. Stomach/Bowel: No dilated large or small bowel loops. Scattered diverticulosis of the descending and sigmoid colon but no focal inflammatory change is seen to suggest acute diverticulitis. No evidence of a focal or generalized bowel wall inflammation. Stomach is unremarkable, partially decompressed. Appendix is not  seen but there are no inflammatory changes about the cecum to suggest acute appendicitis.  Vascular/Lymphatic: Aortic atherosclerosis. No abdominal aortic aneurysm. No acute-appearing vascular abnormality. No enlarged lymph nodes are seen in the abdomen or pelvis. Reproductive: No mass or free fluid. Other: No free fluid or abscess collection. No free intraperitoneal air. Musculoskeletal: Degenerative spondylosis of the slightly scoliotic lumbar spine, at least moderate in degree. No acute findings. IMPRESSION: 1. Bladder walls appear mildly thickened circumferentially, but characterization is limited by bladder decompression. Recommend correlation with urinalysis to exclude cystitis. 2. Otherwise, no acute findings within the chest, abdomen or pelvis. No other possible source for sepsis identified. 3. Colonic diverticulosis without evidence of acute diverticulitis. 4. Three-vessel coronary artery calcifications. 5. LEFT renal atrophy. 6. Degenerative spondylosis of the scoliotic thoracic and lumbar spine, at least moderate in degree. Electronically Signed   By: Franki Cabot M.D.   On: 07/22/2022 14:28   DG Chest Port 1 View  Result Date: 07/22/2022 CLINICAL DATA:  Questionable sepsis - evaluate for abnormality EXAM: PORTABLE CHEST 1 VIEW COMPARISON:  05/01/2022 FINDINGS: The heart size and mediastinal contours are within normal limits. Aortic atherosclerosis. Small streaky opacity at the periphery of the right lung base. Minimally increased left basilar interstitial markings. No pleural effusion or pneumothorax. The visualized skeletal structures are unremarkable. IMPRESSION: Small streaky opacity at the periphery of the right lung base, which may represent atelectasis or pneumonia. Electronically Signed   By: Davina Poke D.O.   On: 07/22/2022 11:26    Pending Labs Unresulted Labs (From admission, onward)     Start     Ordered   07/24/22 0500  CBC  Tomorrow morning,   R        07/23/22 0821   07/24/22 4259  Basic metabolic panel  Tomorrow morning,   R        07/23/22 0821             Vitals/Pain Today's Vitals   07/23/22 0656 07/23/22 1033 07/23/22 1101 07/23/22 1404  BP:   109/60 (!) 117/50  Pulse:  70 80 71  Resp:  15 (!) 22 16  Temp: 97.6 F (36.4 C)  97.7 F (36.5 C)   TempSrc: Oral  Oral   SpO2:  94% 100% 95%  Weight:      Height:      PainSc:        Isolation Precautions No active isolations  Medications Medications  cefTRIAXone (ROCEPHIN) 2 g in sodium chloride 0.9 % 100 mL IVPB (0 g Intravenous Stopped 07/22/22 1855)  acetaminophen (TYLENOL) tablet 650 mg ( Oral See Alternative 07/22/22 2038)    Or  acetaminophen (TYLENOL) suppository 650 mg (650 mg Rectal Given 07/22/22 2038)  ondansetron (ZOFRAN) tablet 4 mg (has no administration in time range)    Or  ondansetron (ZOFRAN) injection 4 mg (has no administration in time range)  potassium chloride 10 mEq in 100 mL IVPB (10 mEq Intravenous Not Given 07/22/22 2222)  0.9 %  sodium chloride infusion ( Intravenous New Bag/Given 07/23/22 0330)  busPIRone (BUSPAR) tablet 10 mg (10 mg Oral Given 07/23/22 0945)  clopidogrel (PLAVIX) tablet 75 mg (75 mg Oral Given 07/23/22 0945)  divalproex (DEPAKOTE) DR tablet 250 mg (250 mg Oral Given 07/23/22 0944)  pantoprazole (PROTONIX) EC tablet 40 mg (40 mg Oral Given 07/23/22 0944)  lactated ringers bolus 1,000 mL (1,000 mLs Intravenous Bolus 07/22/22 1145)  iohexol (OMNIPAQUE) 350 MG/ML injection 60 mL (60 mLs Intravenous Contrast Given 07/22/22  1411)  potassium chloride 10 mEq in 100 mL IVPB (0 mEq Intravenous Stopped 07/23/22 0105)    Mobility PT worked with her today, otherwise has been on bedrest.  Purwik in place. High fall risk   Focused Assessments Cardiac Assessment Handoff:    Lab Results  Component Value Date   CKTOTAL 142 07/22/2022   TROPONINI <0.03 08/26/2016   Lab Results  Component Value Date   DDIMER 1.04 (H) 10/23/2020   Does the Patient currently have chest pain? No   , Pulmonary Assessment Handoff:  Lung sounds:   O2 Device: Nasal  Cannula O2 Flow Rate (L/min): 2 L/min    R Recommendations: See Admitting Provider Note  Report given to:   Additional Notes:

## 2022-07-23 NOTE — Evaluation (Addendum)
Physical Therapy Evaluation Patient Details Name: [name] [name] MRN: [id number] DOB: [birth year] Today's Date: 07/23/2022  History of Present Illness  Pt is an 82 y.o. female admitted 07/22/22 with slurred speech and confusion when home health services came to house, potential fall. Workup for sepsis due to UTI. Head CT negative for acute abnormality. PMH includes HTN, CAD, CKD 3, HLD, anemia, OA, anxiety, depression.   Clinical Impression  Pt presents with an overall decrease in functional mobility secondary to above. PTA, pt limited ambulator with rollator, lives alone, reports having PCA assist 2x/wk for ADL/iADLs; daughter and grandson live nearby, assist PRN, though daughter currently admitted to hospital. Today, pt requiring minA for limited standing activity; pt with 2x episodes of decreased responsiveness with initial sitting, then on final return to supine; BP stable, unclear if vestibular-related(?). Pt would benefit from continued acute PT services to maximize functional mobility and independence prior to d/c with SNF-level therapies, unless pt able to have increased initial support at home. Patient reports she may have an option for increased hired caregivers at home (?).    Orthostatic BPs Supine 127/59 (79)  Sitting 116/70 (85)  Standing 131/68 (81)  Return to supine, post-transfer 148/74 (96)     Recommendations for follow up therapy are one component of a multi-disciplinary discharge planning process, led by the attending physician.  Recommendations may be updated based on patient status, additional functional criteria and insurance authorization.  Follow Up Recommendations Skilled nursing-short term rehab (<3 hours/day) (vs. HH with increased PCA support) Can patient physically be transported by private vehicle: Yes    Assistance Recommended at Discharge Frequent or constant Supervision/Assistance  Patient can return home with the following  A little help with walking and/or  transfers;A lot of help with bathing/dressing/bathroom;Assistance with cooking/housework    Equipment Recommendations  (TBD)  Recommendations for Other Services       Functional Status Assessment Patient has had a recent decline in their functional status and demonstrates the ability to make significant improvements in function in a reasonable and predictable amount of time.     Precautions / Restrictions Precautions Precautions: Fall;Other (comment) Precaution Comments: bladder/bowel incontinence; syncopal(?) episodes with bed mobility Restrictions Weight Bearing Restrictions: No      Mobility  Bed Mobility Overal bed mobility: Needs Assistance Bed Mobility: Supine to Sit, Sit to Supine     Supine to sit: Min assist, HOB elevated Sit to supine: Min assist   General bed mobility comments: minA for HHA to elevate trunk, pt with episode of decreased responsiveness and staring blankly requiring return to supine, symptoms did not reoccur on second sitting trial; minA for LE management return to supine since ED stretcher tall, another episode of decreased responsiveness laying back down    Transfers Overall transfer level: Needs assistance Equipment used: 1 person hand held assist Transfers: Sit to/from Stand, Bed to chair/wheelchair/BSC Sit to Stand: Min assist   Step pivot transfers: Min assist       General transfer comment: minA for trunk elevation and stability with step pivot transfer from stretcher<>BSC    Ambulation/Gait                  Stairs            Wheelchair Mobility    Modified Rankin (Stroke Patients Only)       Balance Overall balance assessment: Needs assistance Sitting-balance support: No upper extremity supported, Feet supported Sitting balance-Leahy Scale: Fair     Standing balance support:  Single extremity supported, During functional activity Standing balance-Leahy Scale: Poor Standing balance comment: reliant on UE  support to balance, assist to don/doff briefs and perform posterior pericare/toilet hygiene                             Pertinent Vitals/Pain Pain Assessment Pain Assessment: Faces Faces Pain Scale: Hurts a little bit Pain Location: neck Pain Descriptors / Indicators: Constant, Discomfort Pain Intervention(s): Monitored during session, Repositioned    Home Living Family/patient expects to be discharged to:: Private residence Living Arrangements: Alone Available Help at Discharge: Family;Personal care attendant;Available PRN/intermittently Type of Home: Mobile home Home Access: Ramped entrance       Home Layout: One level   Additional Comments: lives alone, daughter stays with her (?) to help but pt also reports daughter currently admitted to hospital ("she doesn't have much longer to live"); grandson assists PRN with driving    Prior Function Prior Level of Function : Needs assist             Mobility Comments: mod indep with rollator, limited ambulator ADLs Comments: PCA assist 2x/wk with "everything" (including bathing, household tasks, cleaning, meal prep); pt reports receiving Meals on Wheels everyday     Hand Dominance        Extremity/Trunk Assessment   Upper Extremity Assessment Upper Extremity Assessment: Generalized weakness (BUE/BLE weeping)    Lower Extremity Assessment Lower Extremity Assessment: Generalized weakness (BUE/BLE weeping)       Communication   Communication: No difficulties  Cognition Arousal/Alertness: Awake/alert Behavior During Therapy: WFL for tasks assessed/performed, Anxious Overall Cognitive Status: No family/caregiver present to determine baseline cognitive functioning                                 General Comments: pt inconsistent historian, some emotional lability requiring redirection. following majority of simple commands with intermittent cues for safety; easily distracted during conversation  requiring redirection to current topic        General Comments General comments (skin integrity, edema, etc.): negative orthostatic hypotension. increased time discussing d/c recommendations, pt ultimately wants to go home to be with daughter ("I don't know how much time i have left with her") but willing to consider SNF rehab    Exercises     Assessment/Plan    PT Assessment Patient needs continued PT services  PT Problem List Decreased strength;Decreased activity tolerance;Decreased balance;Decreased mobility;Decreased cognition;Decreased knowledge of use of DME;Decreased safety awareness;Cardiopulmonary status limiting activity;Decreased skin integrity       PT Treatment Interventions DME instruction;Gait training;Functional mobility training;Therapeutic activities;Therapeutic exercise;Balance training;Patient/family education    PT Goals (Current goals can be found in the Care Plan section)  Acute Rehab PT Goals Patient Stated Goal: "I want to go home to be with my daughter" PT Goal Formulation: With patient Time For Goal Achievement: 08/06/22 Potential to Achieve Goals: Fair    Frequency Min 3X/week     Co-evaluation               AM-PAC PT "6 Clicks" Mobility  Outcome Measure Help needed turning from your back to your side while in a flat bed without using bedrails?: A Little Help needed moving from lying on your back to sitting on the side of a flat bed without using bedrails?: A Little Help needed moving to and from a bed to a chair (including a wheelchair)?: A  Little Help needed standing up from a chair using your arms (e.g., wheelchair or bedside chair)?: A Little Help needed to walk in hospital room?: Total Help needed climbing 3-5 steps with a railing? : Total 6 Click Score: 14    End of Session Equipment Utilized During Treatment: Gait belt Activity Tolerance: Patient tolerated treatment well;Patient limited by fatigue Patient left: in bed;with call  bell/phone within reach Nurse Communication: Mobility status PT Visit Diagnosis: Other abnormalities of gait and mobility (R26.89);Muscle weakness (generalized) (M62.81)    Time: 1886-7737 PT Time Calculation (min) (ACUTE ONLY): 27 min   Charges:   PT Evaluation $PT Eval Moderate Complexity: 1 Mod PT Treatments $Therapeutic Activity: 8-22 mins      Mabeline Caras, PT, DPT Acute Rehabilitation Services  Personal: [facility] Office: [place] 07/23/2022, 12:57 PM

## 2022-07-24 DIAGNOSIS — N3000 Acute cystitis without hematuria: Secondary | ICD-10-CM | POA: Diagnosis not present

## 2022-07-24 LAB — CBC
HCT: 29.7 % — ABNORMAL LOW (ref 36.0–46.0)
Hemoglobin: 10.4 g/dL — ABNORMAL LOW (ref 12.0–15.0)
MCH: 35.1 pg — ABNORMAL HIGH (ref 26.0–34.0)
MCHC: 35 g/dL (ref 30.0–36.0)
MCV: 100.3 fL — ABNORMAL HIGH (ref 80.0–100.0)
Platelets: 95 10*3/uL — ABNORMAL LOW (ref 150–400)
RBC: 2.96 MIL/uL — ABNORMAL LOW (ref 3.87–5.11)
RDW: 14.3 % (ref 11.5–15.5)
WBC: 6.9 10*3/uL (ref 4.0–10.5)
nRBC: 0 % (ref 0.0–0.2)

## 2022-07-24 LAB — BASIC METABOLIC PANEL
Anion gap: 7 (ref 5–15)
BUN: 15 mg/dL (ref 8–23)
CO2: 23 mmol/L (ref 22–32)
Calcium: 7.7 mg/dL — ABNORMAL LOW (ref 8.9–10.3)
Chloride: 112 mmol/L — ABNORMAL HIGH (ref 98–111)
Creatinine, Ser: 0.84 mg/dL (ref 0.44–1.00)
GFR, Estimated: >60 mL/min (ref >60)
Glucose, Bld: 99 mg/dL (ref 70–99)
Potassium: 3.3 mmol/L — ABNORMAL LOW (ref 3.5–5.1)
Sodium: 142 mmol/L (ref 135–145)

## 2022-07-24 LAB — MRSA NEXT GEN BY PCR, NASAL: MRSA by PCR Next Gen: NOT DETECTED

## 2022-07-24 LAB — URINE CULTURE: Culture: 80000 — AB

## 2022-07-24 MED ORDER — GERHARDT'S BUTT CREAM
1.0000 | TOPICAL_CREAM | Freq: Two times a day (BID) | CUTANEOUS | Status: DC
  Administered 2022-07-24 – 2022-07-26 (×5): 1 via TOPICAL
  Filled 2022-07-24: qty 1

## 2022-07-24 MED ORDER — RISAQUAD PO CAPS
2.0000 | ORAL_CAPSULE | Freq: Every day | ORAL | Status: DC
  Administered 2022-07-24 – 2022-07-26 (×3): 2 via ORAL
  Filled 2022-07-24 (×3): qty 2

## 2022-07-24 MED ORDER — GABAPENTIN 100 MG PO CAPS
100.0000 mg | ORAL_CAPSULE | Freq: Three times a day (TID) | ORAL | Status: DC
  Administered 2022-07-24 – 2022-07-26 (×7): 100 mg via ORAL
  Filled 2022-07-24 (×7): qty 1

## 2022-07-24 MED ORDER — OXYMETAZOLINE HCL 0.05 % NA SOLN
2.0000 | Freq: Two times a day (BID) | NASAL | Status: DC | PRN
  Administered 2022-07-24: 2 via NASAL
  Filled 2022-07-24: qty 30

## 2022-07-24 NOTE — Plan of Care (Signed)

## 2022-07-24 NOTE — Progress Notes (Signed)
PROGRESS NOTE    [name] [name]  PTW:[id number] DOB: [birth year] DOA: 07/22/2022 PCP: Emelia Loron, NP    Brief Narrative:  [name] [name] is a 82 y.o. adult with medical history significant of HTN, CKD stage 3b, anemia.   History is limited as patient is confused and was brought in by caregivers who are not available, nor has any documentation been done by ER doctor who spoke with them.  [name] was called but he was not privy to any information except 2 days ago she seemed like her normal self on the phone.  Triage starts that at 9 AM she presented with slurred speech and confusion when h/h came to the house.  Her BP was 90/58.  There was concern for potential accidental overmedication but unclear why.  There was also concern for a fall but patient does not recall if she did.    In the ER, her urine was found to be infected.  CT scan of abdomen and pelvis was done.  Ct head is pending.   K was low.  No WBC count, Hgb 10.4 and plts 88.  Cr 1.49 (baseline of 1)   Assessment and Plan: Sepsis due to UTI/gram negative bacteremia -Small streaky opacity at the periphery of the right lung base, which may represent atelectasis or pneumonia-- CT scan appears to be atelectasis -U/A suggestive of UTI -culture pending -- e coli -IV Abx -blood cultures done and gram negative bacteria- ecoli  Hypokalemia replete   Macrocytic anemia -monitor   Hypokalemia - Replaced   AKI on CKD stage IIIb - improved   -Resume home meds as able -SLP eval- regular diet  PT/OT eval    DVT prophylaxis: SCDs Start: 07/22/22 1654    Code Status: DNR Family Communication: daughter updated  Disposition Plan:  Level of care: Telemetry Medical Status is: Inpatient Remains inpatient appropriate    Consultants:  none   Subjective: Feeling better-- asking if she can be d/c'd the same time as her daughter  Objective: Vitals:   07/24/22 0900 07/24/22 1000 07/24/22 1100 07/24/22 1113  BP:      Pulse: 63  71 62 76  Resp: 16 (!) 23 (!) 26 14  Temp:      TempSrc:      SpO2: 96% 96% 93% 96%  Weight:      Height:        Intake/Output Summary (Last 24 hours) at 07/24/2022 1207 Last data filed at 07/24/2022 7001 Gross per 24 hour  Intake --  Output 600 ml  Net -600 ml   Filed Weights   07/22/22 1044  Weight: 64.4 kg    Examination:    General: Appearance:     Overweight adult in no acute distress     Lungs:      respirations unlabored  Heart:    Normal heart rate.   MS:   All extremities are intact.   Neurologic:   Awake, alert       Data Reviewed: I have personally reviewed following labs and imaging studies  CBC: Recent Labs  Lab 07/22/22 1146 07/23/22 0408 07/24/22 0715  WBC 5.2 6.0 6.9  NEUTROABS 4.1  --   --   HGB 10.4* 9.2* 10.4*  HCT 30.7* 27.5* 29.7*  MCV 102.7* 103.8* 100.3*  PLT 88* 84* 95*   Basic Metabolic Panel: Recent Labs  Lab 07/22/22 1146 07/23/22 0408 07/24/22 0715  NA 140 140 142  K 3.3* 3.7 3.3*  CL 105 108 112*  CO2 23 21* 23  GLUCOSE 96 100* 99  BUN 24* 21 15  CREATININE 1.49* 1.26* 0.84  CALCIUM 8.3* 7.6* 7.7*   GFR: Estimated Creatinine Clearance (by C-G formula based on SCr of 0.84 mg/dL) Female: 42.5 mL/min Female: 52.6 mL/min Liver Function Tests: Recent Labs  Lab 07/22/22 1146 07/23/22 0408  AST 21 19  ALT 8 9  ALKPHOS 44 40  BILITOT 0.8 0.6  PROT 6.2* 5.7*  ALBUMIN 3.1* 2.7*   No results for input(s): "LIPASE", "AMYLASE" in the last 168 hours. No results for input(s): "AMMONIA" in the last 168 hours. Coagulation Profile: Recent Labs  Lab 07/22/22 1146  INR 1.2   Cardiac Enzymes: Recent Labs  Lab 07/22/22 1459  CKTOTAL 142   BNP (last 3 results) No results for input(s): "PROBNP" in the last 8760 hours. HbA1C: No results for input(s): "HGBA1C" in the last 72 hours. CBG: No results for input(s): "GLUCAP" in the last 168 hours. Lipid Profile: No results for input(s): "CHOL", "HDL", "LDLCALC", "TRIG",  "CHOLHDL", "LDLDIRECT" in the last 72 hours. Thyroid Function Tests: No results for input(s): "TSH", "T4TOTAL", "FREET4", "T3FREE", "THYROIDAB" in the last 72 hours. Anemia Panel: No results for input(s): "VITAMINB12", "FOLATE", "FERRITIN", "TIBC", "IRON", "RETICCTPCT" in the last 72 hours. Sepsis Labs: Recent Labs  Lab 07/22/22 1205  LATICACIDVEN 0.7    Recent Results (from the past 240 hour(s))  Urine Culture     Status: Abnormal   Collection Time: 07/22/22 10:52 AM   Specimen: In/Out Cath Urine  Result Value Ref Range Status   Specimen Description IN/OUT CATH URINE  Final   Special Requests   Final    NONE Performed at [facility], [address] [place], [place] [address]    Culture 80,000 COLONIES/mL ESCHERICHIA COLI (A)  Final   Report Status 07/24/2022 FINAL  Final   Organism ID, Bacteria ESCHERICHIA COLI (A)  Final      Susceptibility   Escherichia coli - MIC*    AMPICILLIN >=32 RESISTANT Resistant     CEFAZOLIN <=4 SENSITIVE Sensitive     CEFEPIME <=0.12 SENSITIVE Sensitive     CEFTRIAXONE <=0.25 SENSITIVE Sensitive     CIPROFLOXACIN <=0.25 SENSITIVE Sensitive     GENTAMICIN <=1 SENSITIVE Sensitive     IMIPENEM <=0.25 SENSITIVE Sensitive     NITROFURANTOIN <=16 SENSITIVE Sensitive     TRIMETH/SULFA >=320 RESISTANT Resistant     AMPICILLIN/SULBACTAM 16 INTERMEDIATE Intermediate     PIP/TAZO <=4 SENSITIVE Sensitive     * 80,000 COLONIES/mL ESCHERICHIA COLI  Blood Culture (routine x 2)     Status: Abnormal (Preliminary result)   Collection Time: 07/22/22  1:16 PM   Specimen: BLOOD LEFT FOREARM  Result Value Ref Range Status   Specimen Description BLOOD LEFT FOREARM  Final   Special Requests   Final    BOTTLES DRAWN AEROBIC AND ANAEROBIC Blood Culture adequate volume   Culture  Setup Time   Final    GRAM NEGATIVE RODS IN BOTH AEROBIC AND ANAEROBIC BOTTLES CRITICAL RESULT CALLED TO, READ BACK BY AND VERIFIED WITH: PHARMD G. ABBOTT 07/23/22 @ 0136 BY AB     Culture (A)  Final    ESCHERICHIA COLI SUSCEPTIBILITIES TO FOLLOW Performed at [facility], [address], [place], [place] [address]    Report Status PENDING  Incomplete  Blood Culture ID Panel (Reflexed)     Status: Abnormal   Collection Time: 07/22/22  1:16 PM  Result Value Ref Range Status  Enterococcus faecalis NOT DETECTED NOT DETECTED Final   Enterococcus Faecium NOT DETECTED NOT DETECTED Final   Listeria monocytogenes NOT DETECTED NOT DETECTED Final   Staphylococcus species NOT DETECTED NOT DETECTED Final   Staphylococcus aureus (BCID) NOT DETECTED NOT DETECTED Final   Staphylococcus epidermidis NOT DETECTED NOT DETECTED Final   Staphylococcus lugdunensis NOT DETECTED NOT DETECTED Final   Streptococcus species NOT DETECTED NOT DETECTED Final   Streptococcus agalactiae NOT DETECTED NOT DETECTED Final   Streptococcus pneumoniae NOT DETECTED NOT DETECTED Final   Streptococcus pyogenes NOT DETECTED NOT DETECTED Final   A.calcoaceticus-baumannii NOT DETECTED NOT DETECTED Final   Bacteroides fragilis NOT DETECTED NOT DETECTED Final   Enterobacterales DETECTED (A) NOT DETECTED Final    Comment: Enterobacterales represent a large order of gram negative bacteria, not a single organism. CRITICAL RESULT CALLED TO, READ BACK BY AND VERIFIED WITH: PHARMD G. ABBOTT 07/23/22 @ 0136 BY AB    Enterobacter cloacae complex NOT DETECTED NOT DETECTED Final   Escherichia coli DETECTED (A) NOT DETECTED Final    Comment: CRITICAL RESULT CALLED TO, READ BACK BY AND VERIFIED WITH: PHARMD G. ABBOTT 07/23/22 @ 0136 BY AB    Klebsiella aerogenes NOT DETECTED NOT DETECTED Final   Klebsiella oxytoca NOT DETECTED NOT DETECTED Final   Klebsiella pneumoniae NOT DETECTED NOT DETECTED Final   Proteus species NOT DETECTED NOT DETECTED Final   Salmonella species NOT DETECTED NOT DETECTED Final   Serratia marcescens NOT DETECTED NOT DETECTED Final   Haemophilus influenzae NOT DETECTED NOT DETECTED  Final   Neisseria meningitidis NOT DETECTED NOT DETECTED Final   Pseudomonas aeruginosa NOT DETECTED NOT DETECTED Final   Stenotrophomonas maltophilia NOT DETECTED NOT DETECTED Final   Candida albicans NOT DETECTED NOT DETECTED Final   Candida auris NOT DETECTED NOT DETECTED Final   Candida glabrata NOT DETECTED NOT DETECTED Final   Candida krusei NOT DETECTED NOT DETECTED Final   Candida parapsilosis NOT DETECTED NOT DETECTED Final   Candida tropicalis NOT DETECTED NOT DETECTED Final   Cryptococcus neoformans/gattii NOT DETECTED NOT DETECTED Final   CTX-M ESBL NOT DETECTED NOT DETECTED Final   Carbapenem resistance IMP NOT DETECTED NOT DETECTED Final   Carbapenem resistance KPC NOT DETECTED NOT DETECTED Final   Carbapenem resistance NDM NOT DETECTED NOT DETECTED Final   Carbapenem resist OXA 48 LIKE NOT DETECTED NOT DETECTED Final   Carbapenem resistance VIM NOT DETECTED NOT DETECTED Final    Comment: Performed at [facility], [address], [place], [place] [address]  Blood Culture (routine x 2)     Status: None (Preliminary result)   Collection Time: 07/22/22  4:21 PM   Specimen: BLOOD RIGHT ARM  Result Value Ref Range Status   Specimen Description BLOOD RIGHT ARM  Final   Special Requests   Final    BOTTLES DRAWN AEROBIC AND ANAEROBIC Blood Culture results may not be optimal due to an inadequate volume of blood received in culture bottles   Culture  Setup Time   Final    GRAM NEGATIVE RODS IN BOTH AEROBIC AND ANAEROBIC BOTTLES CRITICAL VALUE NOTED.  VALUE IS CONSISTENT WITH PREVIOUSLY REPORTED AND CALLED VALUE.    Culture   Final    GRAM NEGATIVE RODS IDENTIFICATION TO FOLLOW Performed at [facility], [address] [place], [place] [address]    Report Status PENDING  Incomplete         Radiology Studies: CT Head Wo Contrast  Result Date: 07/22/2022 CLINICAL DATA:  Head trauma, moderate-severe EXAM: CT HEAD WITHOUT CONTRAST TECHNIQUE: Contiguous axial  images were obtained from the base of the skull through the vertex without intravenous contrast. RADIATION DOSE REDUCTION: This exam was performed according to the departmental dose-optimization program which includes automated exposure control, adjustment of the mA and/or kV according to patient size and/or use of iterative reconstruction technique. COMPARISON:  11/21/2020 FINDINGS: Brain: There is atrophy and chronic small vessel disease changes. No acute intracranial abnormality. Specifically, no hemorrhage, hydrocephalus, mass lesion, acute infarction, or significant intracranial injury. Vascular: No hyperdense vessel or unexpected calcification. Skull: No acute calvarial abnormality. Sinuses/Orbits: No acute findings Other: None IMPRESSION: Atrophy, chronic microvascular disease. No acute intracranial abnormality. Electronically Signed   By: Rolm Baptise M.D.   On: 07/22/2022 17:25   CT CHEST ABDOMEN PELVIS W CONTRAST  Result Date: 07/22/2022 CLINICAL DATA:  Sepsis. EXAM: CT CHEST, ABDOMEN, AND PELVIS WITH CONTRAST TECHNIQUE: Multidetector CT imaging of the chest, abdomen and pelvis was performed following the standard protocol during bolus administration of intravenous contrast. RADIATION DOSE REDUCTION: This exam was performed according to the departmental dose-optimization program which includes automated exposure control, adjustment of the mA and/or kV according to patient size and/or use of iterative reconstruction technique. CONTRAST:  67m OMNIPAQUE IOHEXOL 350 MG/ML SOLN COMPARISON:  Chest CT dated 11/21/2020. CT abdomen and pelvis dated 10/27/2020. FINDINGS: CT CHEST FINDINGS Cardiovascular: No thoracic aortic aneurysm or evidence of aortic dissection. Scattered aortic atherosclerosis. No pericardial effusion. Three-vessel coronary artery calcifications. Mediastinum/Nodes: No mass or enlarged lymph nodes are seen within the mediastinum or perihilar regions. Esophagus appears normal. Trachea and  central bronchi are unremarkable. Lungs/Pleura: Mild bibasilar atelectasis. Lungs are otherwise clear. No pleural effusion or pneumothorax. Musculoskeletal: Degenerative spondylosis of the scoliotic thoracic spine, mild to moderate in degree. No acute findings. CT ABDOMEN PELVIS FINDINGS Hepatobiliary: No focal liver abnormality is seen. Gallbladder is unremarkable. No bile duct dilatation. Pancreas: Unremarkable. No pancreatic ductal dilatation or surrounding inflammatory changes. Spleen: Mildly prominent in size but otherwise unremarkable. Adrenals/Urinary Tract: Adrenal glands appear normal. LEFT kidney is atrophic. RIGHT kidney is unremarkable without suspicious mass, stone or hydronephrosis. No ureteral or bladder calculi are identified. Bladder walls appear mildly thickened circumferentially, but characterization is limited by bladder decompression. Stomach/Bowel: No dilated large or small bowel loops. Scattered diverticulosis of the descending and sigmoid colon but no focal inflammatory change is seen to suggest acute diverticulitis. No evidence of a focal or generalized bowel wall inflammation. Stomach is unremarkable, partially decompressed. Appendix is not seen but there are no inflammatory changes about the cecum to suggest acute appendicitis. Vascular/Lymphatic: Aortic atherosclerosis. No abdominal aortic aneurysm. No acute-appearing vascular abnormality. No enlarged lymph nodes are seen in the abdomen or pelvis. Reproductive: No mass or free fluid. Other: No free fluid or abscess collection. No free intraperitoneal air. Musculoskeletal: Degenerative spondylosis of the slightly scoliotic lumbar spine, at least moderate in degree. No acute findings. IMPRESSION: 1. Bladder walls appear mildly thickened circumferentially, but characterization is limited by bladder decompression. Recommend correlation with urinalysis to exclude cystitis. 2. Otherwise, no acute findings within the chest, abdomen or pelvis. No  other possible source for sepsis identified. 3. Colonic diverticulosis without evidence of acute diverticulitis. 4. Three-vessel coronary artery calcifications. 5. LEFT renal atrophy. 6. Degenerative spondylosis of the scoliotic thoracic and lumbar spine, at least moderate in degree. Electronically Signed   By: SFranki CabotM.D.   On: 07/22/2022 14:28        Scheduled Meds:  acidophilus  2 capsule Oral Daily   busPIRone  10 mg Oral TID   clopidogrel  75 mg Oral q morning   divalproex  250 mg Oral BID   gabapentin  100 mg Oral TID   Gerhardt's butt cream  1 Application Topical BID   pantoprazole  40 mg Oral Daily   Continuous Infusions:  cefTRIAXone (ROCEPHIN)  IV Stopped (07/23/22 1835)     LOS: 2 days    Time spent: 45 minutes spent on chart review, discussion with nursing staff, consultants, updating family and interview/physical exam; more than 50% of that time was spent in counseling and/or coordination of care.    Geradine Girt, DO [facility] Available via Epic secure chat 7am-7pm After these hours, please refer to coverage provider listed on [email or web address] 07/24/2022, 12:07 PM

## 2022-07-24 NOTE — TOC Progression Note (Signed)
Transition of Care [facility]) - Progression Note    Patient Details  Name: [name] [name] MRN: [id number] Date of Birth: Nov 28, 1940  Transition of Care [facility]) CM/SW [place], LCSW Phone Number: 07/24/2022, 9:26 AM  Clinical Narrative:    CSW received SNF consult. Will assess with patient. She was at [facility] from 10/31-11/17 and thus used 18 of her 20 non-copay days and she has not had a 60 day wellness period.      Barriers to Discharge: Continued Medical Work up  Expected Discharge Plan and Services       Living arrangements for the past 2 months: Mobile Home                                       Social Determinants of Health (SDOH) Interventions [place]: Food Insecurity Present (05/02/2022)  Housing: Medium Risk (05/02/2022)  Transportation Needs: Unmet Transportation Needs (05/02/2022)  Utilities: Not At Risk (05/02/2022)  Depression (PHQ2-9): Low Risk  (05/26/2019)  Stress: Stress Concern Present (03/22/2018)  Tobacco Use: Medium Risk (07/22/2022)    Readmission Risk Interventions    05/09/2022    3:18 PM 05/08/2022    1:31 PM  Readmission Risk Prevention Plan  Post Dischage Appt Complete   Medication Screening Complete   Transportation Screening  Complete  PCP or Specialist Appt within 5-7 Days  Complete  Home Care Screening  Complete  Medication Review (RN CM)  Complete

## 2022-07-24 NOTE — Progress Notes (Signed)
Mobility Specialist Progress Note:    07/24/22 1552  Mobility  Activity Transferred from bed to chair  Level of Assistance +2 (takes two people)  Assistive Device  (HHA)  Distance Ambulated (ft) 2 ft  Activity Response Tolerated well  Mobility Referral Yes  $Mobility charge 1 Mobility   Pt was agreeable to mobility session. +2 MinA required to stand and pivot to chair. Pt c/o vertigo symptoms and mentioned "lights going out" when sitting EOB and standing, pt recovered from dizziness. Left pt in chair with chair alarm on, all needs met.   Royetta Crochet Mobility Specialist Please contact via Solicitor or  Rehab office at [phone]

## 2022-07-24 NOTE — Progress Notes (Incomplete)
Mobility Specialist Progress Note:   [name] [name] Mobility Specialist Please contact via SecureChat or  Rehab office at [phone]  

## 2022-07-24 NOTE — TOC Progression Note (Signed)
Transition of Care [facility]) - Progression Note    Patient Details  Name: [name] [name] MRN: [id number] Date of Birth: [birth year]  Transition of Care [facility]) CM/SW [place], LCSW Phone Number: 07/24/2022, 3:20 PM  Clinical Narrative:    CSW received consult for possible SNF placement at time of discharge. CSW spoke with patient. Patient reported that she does not want to go to SNF because she will have to start paying for it soon and would like home health services. She stated she has spoken with [facility] and they have agreed to increase her hours with them. She is privately paying for their service as she does not have Medicaid. She requested TOC follow up with Mickel Crow. She stated her caregiver has found her keys and wallet and has secured the house, leaving the keys at the front office of her mobile home.  CSW discussed equipment needs with patient and she stated she has two rollators, a rolling walker, wheelchair, 2 bedside commodes, and two shower chairs. She has a ramp. She receives meals on wheels. She stated she is hopeful that she can discharge the same day as her daughter and her caregiver will come pick them both up. CSW confirmed PCP and address with patient. Will follow [facility] referral.   CSW provided supportive listening while patient went through a life review, sharing that her daughter [name] is her only living child and they need each other and her grandson is very helpful to her as well.    Expected Discharge Plan: [place] Barriers to Discharge: Continued Medical Work up  Expected Discharge Plan and Services In-house Referral: Clinical Social Work   Post Acute Care Choice: [place] arrangements for the past 2 months: Mobile Home                                       Social Determinants of Health (SDOH) Interventions [place]: Food Insecurity Present (05/02/2022)  Housing: Medium Risk  (05/02/2022)  Transportation Needs: Unmet Transportation Needs (05/02/2022)  Utilities: Not At Risk (05/02/2022)  Depression (PHQ2-9): Low Risk  (05/26/2019)  Stress: Stress Concern Present (03/22/2018)  Tobacco Use: Medium Risk (07/22/2022)    Readmission Risk Interventions    05/09/2022    3:18 PM 05/08/2022    1:31 PM  Readmission Risk Prevention Plan  Post Dischage Appt Complete   Medication Screening Complete   Transportation Screening  Complete  PCP or Specialist Appt within 5-7 Days  Complete  Home Care Screening  Complete  Medication Review (RN CM)  Complete

## 2022-07-24 NOTE — TOC Progression Note (Signed)
Transition of Care [facility]) - Progression Note    Patient Details  Name: [name] [name] MRN: [id number] Date of Birth: 03-31-1941  Transition of Care [facility]) CM/SW Contact  Loletha Grayer Beverely Pace, RN Phone Number: 07/24/2022, 4:10 PM  Clinical Narrative:    Case manager spoke with patient concerning discharge needs. Patient states she has aide service 2x/week through  [employer]. She says they will be increasing her service when she returns home. Case manager discussed [place] and patient is agreeable. Referral was called to [facility], [employer]. Patient states that her daughter lives 4 doors down from her at [address]. TOC Team will continue to follow.   Expected Discharge Plan: [place] Barriers to Discharge: Continued Medical Work up  Expected Discharge Plan and Services In-house Referral: Clinical Social Work Discharge Planning Services: CM Consult Post Acute Care Choice: [place] arrangements for the past 2 months: Mobile Home                           HH Arranged: PT, OT [facility] Agency: [facility]) Date HH Agency Contacted: 07/24/22 Time [place]: [place] Representative spoke with at [place]: Floydene Flock   Social Determinants of Health ([place]) Interventions [place]: Food Insecurity Present (05/02/2022)  Housing: Medium Risk (05/02/2022)  Transportation Needs: Unmet Transportation Needs (05/02/2022)  Utilities: Not At Risk (05/02/2022)  Depression (PHQ2-9): Low Risk  (05/26/2019)  Stress: Stress Concern Present (03/22/2018)  Tobacco Use: Medium Risk (07/22/2022)    Readmission Risk Interventions    05/09/2022    3:18 PM 05/08/2022    1:31 PM  Readmission Risk Prevention Plan  Post Dischage Appt Complete   Medication Screening Complete   Transportation Screening  Complete  PCP or Specialist Appt within 5-7 Days  Complete  Home Care Screening  Complete  Medication Review (RN CM)  Complete

## 2022-07-25 DIAGNOSIS — R410 Disorientation, unspecified: Secondary | ICD-10-CM

## 2022-07-25 DIAGNOSIS — N3000 Acute cystitis without hematuria: Secondary | ICD-10-CM | POA: Diagnosis not present

## 2022-07-25 LAB — BASIC METABOLIC PANEL
Anion gap: 10 (ref 5–15)
BUN: 8 mg/dL (ref 8–23)
CO2: 25 mmol/L (ref 22–32)
Calcium: 8.3 mg/dL — ABNORMAL LOW (ref 8.9–10.3)
Chloride: 108 mmol/L (ref 98–111)
Creatinine, Ser: 0.94 mg/dL (ref 0.44–1.00)
GFR, Estimated: >60 mL/min (ref >60)
Glucose, Bld: 107 mg/dL — ABNORMAL HIGH (ref 70–99)
Potassium: 3.2 mmol/L — ABNORMAL LOW (ref 3.5–5.1)
Sodium: 143 mmol/L (ref 135–145)

## 2022-07-25 LAB — CULTURE, BLOOD (ROUTINE X 2): Special Requests: ADEQUATE

## 2022-07-25 LAB — CBC
HCT: 32.5 % — ABNORMAL LOW (ref 36.0–46.0)
Hemoglobin: 11.5 g/dL — ABNORMAL LOW (ref 12.0–15.0)
MCH: 34.8 pg — ABNORMAL HIGH (ref 26.0–34.0)
MCHC: 35.4 g/dL (ref 30.0–36.0)
MCV: 98.5 fL (ref 80.0–100.0)
Platelets: 112 10*3/uL — ABNORMAL LOW (ref 150–400)
RBC: 3.3 MIL/uL — ABNORMAL LOW (ref 3.87–5.11)
RDW: 14.1 % (ref 11.5–15.5)
WBC: 5 10*3/uL (ref 4.0–10.5)
nRBC: 0 % (ref 0.0–0.2)

## 2022-07-25 MED ORDER — LOPERAMIDE HCL 2 MG PO CAPS
2.0000 mg | ORAL_CAPSULE | ORAL | Status: DC | PRN
  Administered 2022-07-25: 2 mg via ORAL
  Filled 2022-07-25: qty 1

## 2022-07-25 MED ORDER — POTASSIUM CHLORIDE CRYS ER 20 MEQ PO TBCR
40.0000 meq | EXTENDED_RELEASE_TABLET | Freq: Once | ORAL | Status: AC
  Administered 2022-07-25: 40 meq via ORAL
  Filled 2022-07-25: qty 2

## 2022-07-25 NOTE — Progress Notes (Signed)
Physical Therapy Treatment Patient Details Name: [name] [name] MRN: [id number] DOB: 1941/02/24 Today's Date: 07/25/2022   History of Present Illness Pt is an 82 y.o. female admitted 07/22/22 with slurred speech and confusion when home health services came to house, potential fall. Workup for sepsis due to UTI. Head CT negative for acute abnormality. PMH includes HTN, CAD, CKD 3, HLD, anemia, OA, anxiety, depression.    PT Comments    Patient presents with symptoms consistent with R posterior canal BPPV.  She reports history of this and had treatments and this is first recurrance.  She was treated with Eply canalith repositioning x 1.  May need further treatment.  She will benefit from follow up at home with vestibular PT.  Able to ambulate in hallway with minguard to S assist.  Noted she has refused SNF and has increased PCA assist.  Remains fall risk, but will need HHPT.  PT to follow up.    Vestibular Assessment - 07/26/22 0001       Symptom Behavior   Subjective history of current problem Reports ongoing history of vertigo.  has had treatments in the past at outpatient clinic.    Type of Dizziness  Imbalance;Spinning;Unsteady with head/body turns    Frequency of Dizziness Intermittent    Duration of Dizziness minutes to several seconds    Symptom Nature Motion provoked;Positional;Intermittent;Variable    Aggravating Factors Activity in general;Supine to sit;Sit to stand    Relieving Factors Slow movements;Rest;Lying supine;Head stationary    Progression of Symptoms Worse    History of similar episodes has had in the past and had outpatient therapy treatments, this is first episode since then      Positional Testing   Dix-Hallpike Dix-Hallpike Right    Sidelying Test Sidelying Right;Sidelying Left      Dix-Hallpike Right   Dix-Hallpike Right Duration 60 sec    Dix-Hallpike Right Symptoms No nystagmus   but pt symptomatic and rolling eyes around in her head     Sidelying Right    Sidelying Right Duration 60 sec    Sidelying Right Symptoms Upbeat, right rotatory nystagmus   lasting about 15 sec with longer symptoms     Sidelying Left   Sidelying Left Duration 30 sec    Sidelying Left Symptoms No nystagmus   but pt symptomatic              Recommendations for follow up therapy are one component of a multi-disciplinary discharge planning process, led by the attending physician.  Recommendations may be updated based on patient status, additional functional criteria and insurance authorization.  Follow Up Recommendations  Home health PT (HHPT with increased PCA support; declined SNF) Can patient physically be transported by private vehicle: Yes   Assistance Recommended at Discharge Intermittent Supervision/Assistance  Patient can return home with the following A little help with walking and/or transfers;A lot of help with bathing/dressing/bathroom;Assistance with cooking/housework   Equipment Recommendations  None recommended by PT    Recommendations for Other Services       Precautions / Restrictions Precautions Precautions: Fall Precaution Comments: bladder/bowel incontinence; syncopal(?) episodes with bed mobility     Mobility  Bed Mobility         Supine to sit: Supervision Sit to supine: Min assist   General bed mobility comments: some assist for leg onto bed to supine; used rail and HOB up to sit    Transfers Overall transfer level: Needs assistance Equipment used: Rolling walker (2 wheels) Transfers: Sit to/from  Stand Sit to Stand: Supervision           General transfer comment: stood from EOB and from toilet with S and increased time UE reliant    Ambulation/Gait Ambulation/Gait assistance: Min guard, Supervision Gait Distance (Feet): 50 Feet (x 2 with seated rest) Assistive device: Rolling walker (2 wheels) Gait Pattern/deviations: Step-to pattern, Step-through pattern, Wide base of support, Decreased stride length        General Gait Details: slow pace   Stairs             Wheelchair Mobility    Modified Rankin (Stroke Patients Only)       Balance Overall balance assessment: Needs assistance   Sitting balance-Leahy Scale: Good     Standing balance support: Single extremity supported Standing balance-Leahy Scale: Poor Standing balance comment: UE support, assist for hygiene                            Cognition Arousal/Alertness: Awake/alert Behavior During Therapy: WFL for tasks assessed/performed, Anxious Overall Cognitive Status: No family/caregiver present to determine baseline cognitive functioning                                 General Comments: likely at baseline, mild cognitive impairments with safety awareness and short term memory        Exercises      General Comments        Pertinent Vitals/Pain Pain Assessment Faces Pain Scale: Hurts a little bit Pain Location: neck Pain Descriptors / Indicators: Constant, Discomfort Pain Intervention(s): Monitored during session, Repositioned    Home Living                          Prior Function            PT Goals (current goals can now be found in the care plan section) Progress towards PT goals: Progressing toward goals    Frequency    Min 3X/week      PT Plan Current plan remains appropriate    Co-evaluation              AM-PAC PT "6 Clicks" Mobility   Outcome Measure  Help needed turning from your back to your side while in a flat bed without using bedrails?: A Little Help needed moving from lying on your back to sitting on the side of a flat bed without using bedrails?: A Little Help needed moving to and from a bed to a chair (including a wheelchair)?: A Little Help needed standing up from a chair using your arms (e.g., wheelchair or bedside chair)?: A Little Help needed to walk in hospital room?: A Little Help needed climbing 3-5 steps with a railing? :  Total 6 Click Score: 16    End of Session   Activity Tolerance: Patient tolerated treatment well Patient left: in bed         Time: 1540-1625 PT Time Calculation (min) (ACUTE ONLY): 45 min  Charges:  $Gait Training: 8-22 mins $Therapeutic Activity: 8-22 mins $Canalith Rep Proc: 8-22 mins                     Magda Kiel, PT Acute Rehabilitation Services Office:[phone] 07/26/2022    Reginia Naas 07/25/2022, 4:33 PM

## 2022-07-25 NOTE — Plan of Care (Signed)

## 2022-07-25 NOTE — Progress Notes (Signed)
PROGRESS NOTE    [name] [name]  FIE:[id number] DOB: 1941-03-16 DOA: 07/22/2022 PCP: Emelia Loron, NP    Brief Narrative:  [name] [name] is a 82 y.o. adult with medical history significant of HTN, CKD stage 3b, anemia.   History is limited as patient is confused and was brought in by caregivers who are not available, nor has any documentation been done by ER doctor who spoke with them.  [name] [name] was called but he was not privy to any information except 2 days ago she seemed like her normal self on the phone.  Triage starts that at 9 AM she presented with slurred speech and confusion when h/h came to the house.  Her BP was 90/58.  There was concern for potential accidental overmedication but unclear why.  There was also concern for a fall but patient does not recall if she did.    In the ER, her urine was found to be infected.  CT scan of abdomen and pelvis was done.  Ct head is pending.   K was low.  No WBC count, Hgb 10.4 and plts 88.  Cr 1.49 (baseline of 1)   Assessment and Plan: Sepsis due to UTI/gram negative bacteremia from e coli -Small streaky opacity at the periphery of the right lung base, which may represent atelectasis or pneumonia-- CT scan appears to be atelectasis -U/A suggestive of UTI -culture- -- e coli -IV Abx- down grade to PO in 24 hours to complete treatment -blood cultures done and gram negative bacteria- ecoli  Hypokalemia repleted   Macrocytic anemia -monitor   Hypokalemia - Replaced   AKI on CKD stage IIIb - improved  Diarrhea -no abdominal pain -PRN imodium and add lactobacillus    PT/OT eval- home health    DVT prophylaxis: SCDs Start: 07/22/22 1654    Code Status: DNR Family Communication: daughter updated (in room next door)  Disposition Plan:  Level of care: Telemetry Medical Status is: Inpatient Remains inpatient appropriate    Consultants:  none   Subjective: Having some loose stools after she eats  Objective: Vitals:    07/25/22 0718 07/25/22 0900 07/25/22 1000 07/25/22 1006  BP:      Pulse: 79 91 99 92  Resp: (!) 22 (!) 29 (!) 32 17  Temp:      TempSrc:      SpO2: 93% 96% 96% 97%  Weight:      Height:        Intake/Output Summary (Last 24 hours) at 07/25/2022 1245 Last data filed at 07/25/2022 0900 Gross per 24 hour  Intake 2203.84 ml  Output 1925 ml  Net 278.84 ml   Filed Weights   07/22/22 1044  Weight: 64.4 kg    Examination:    General: Appearance:     Overweight adult in no acute distress     Lungs:      respirations unlabored  Heart:    Normal heart rate.   MS:   All extremities are intact.   Neurologic:   Awake, alert       Data Reviewed: I have personally reviewed following labs and imaging studies  CBC: Recent Labs  Lab 07/22/22 1146 07/23/22 0408 07/24/22 0715 07/25/22 0828  WBC 5.2 6.0 6.9 5.0  NEUTROABS 4.1  --   --   --   HGB 10.4* 9.2* 10.4* 11.5*  HCT 30.7* 27.5* 29.7* 32.5*  MCV 102.7* 103.8* 100.3* 98.5  PLT 88* 84* 95* 166*   Basic Metabolic Panel: Recent  Labs  Lab 07/22/22 1146 07/23/22 0408 07/24/22 0715 07/25/22 0828  NA 140 140 142 143  K 3.3* 3.7 3.3* 3.2*  CL 105 108 112* 108  CO2 23 21* 23 25  GLUCOSE 96 100* 99 107*  BUN 24* '21 15 8  '$ CREATININE 1.49* 1.26* 0.84 0.94  CALCIUM 8.3* 7.6* 7.7* 8.3*   GFR: Estimated Creatinine Clearance (by C-G formula based on SCr of 0.94 mg/dL) Female: 38 mL/min Female: 47 mL/min Liver Function Tests: Recent Labs  Lab 07/22/22 1146 07/23/22 0408  AST 21 19  ALT 8 9  ALKPHOS 44 40  BILITOT 0.8 0.6  PROT 6.2* 5.7*  ALBUMIN 3.1* 2.7*   No results for input(s): "LIPASE", "AMYLASE" in the last 168 hours. No results for input(s): "AMMONIA" in the last 168 hours. Coagulation Profile: Recent Labs  Lab 07/22/22 1146  INR 1.2   Cardiac Enzymes: Recent Labs  Lab 07/22/22 1459  CKTOTAL 142   BNP (last 3 results) No results for input(s): "PROBNP" in the last 8760 hours. HbA1C: No results  for input(s): "HGBA1C" in the last 72 hours. CBG: No results for input(s): "GLUCAP" in the last 168 hours. Lipid Profile: No results for input(s): "CHOL", "HDL", "LDLCALC", "TRIG", "CHOLHDL", "LDLDIRECT" in the last 72 hours. Thyroid Function Tests: No results for input(s): "TSH", "T4TOTAL", "FREET4", "T3FREE", "THYROIDAB" in the last 72 hours. Anemia Panel: No results for input(s): "VITAMINB12", "FOLATE", "FERRITIN", "TIBC", "IRON", "RETICCTPCT" in the last 72 hours. Sepsis Labs: Recent Labs  Lab 07/22/22 1205  LATICACIDVEN 0.7    Recent Results (from the past 240 hour(s))  Urine Culture     Status: Abnormal   Collection Time: 07/22/22 10:52 AM   Specimen: In/Out Cath Urine  Result Value Ref Range Status   Specimen Description IN/OUT CATH URINE  Final   Special Requests   Final    NONE Performed at [facility], [address], [place], [place] [address]    Culture 80,000 COLONIES/mL ESCHERICHIA COLI (A)  Final   Report Status 07/24/2022 FINAL  Final   Organism ID, Bacteria ESCHERICHIA COLI (A)  Final      Susceptibility   Escherichia coli - MIC*    AMPICILLIN >=32 RESISTANT Resistant     CEFAZOLIN <=4 SENSITIVE Sensitive     CEFEPIME <=0.12 SENSITIVE Sensitive     CEFTRIAXONE <=0.25 SENSITIVE Sensitive     CIPROFLOXACIN <=0.25 SENSITIVE Sensitive     GENTAMICIN <=1 SENSITIVE Sensitive     IMIPENEM <=0.25 SENSITIVE Sensitive     NITROFURANTOIN <=16 SENSITIVE Sensitive     TRIMETH/SULFA >=320 RESISTANT Resistant     AMPICILLIN/SULBACTAM 16 INTERMEDIATE Intermediate     PIP/TAZO <=4 SENSITIVE Sensitive     * 80,000 COLONIES/mL ESCHERICHIA COLI  Blood Culture (routine x 2)     Status: Abnormal   Collection Time: 07/22/22  1:16 PM   Specimen: BLOOD LEFT FOREARM  Result Value Ref Range Status   Specimen Description BLOOD LEFT FOREARM  Final   Special Requests   Final    BOTTLES DRAWN AEROBIC AND ANAEROBIC Blood Culture adequate volume   Culture  Setup Time   Final     GRAM NEGATIVE RODS IN BOTH AEROBIC AND ANAEROBIC BOTTLES CRITICAL RESULT CALLED TO, READ BACK BY AND VERIFIED WITH: PHARMD G. ABBOTT 07/23/22 @ 0136 BY AB Performed at [facility], [place] [address], [place], [place] [address]    Culture ESCHERICHIA COLI (A)  Final   Report Status 07/25/2022 FINAL  Final   Organism ID, Bacteria ESCHERICHIA COLI  Final      Susceptibility   Escherichia coli - MIC*    AMPICILLIN >=32 RESISTANT Resistant     CEFAZOLIN <=4 SENSITIVE Sensitive     CEFEPIME <=0.12 SENSITIVE Sensitive     CEFTAZIDIME <=1 SENSITIVE Sensitive     CEFTRIAXONE <=0.25 SENSITIVE Sensitive     CIPROFLOXACIN <=0.25 SENSITIVE Sensitive     GENTAMICIN <=1 SENSITIVE Sensitive     IMIPENEM <=0.25 SENSITIVE Sensitive     TRIMETH/SULFA >=320 RESISTANT Resistant     AMPICILLIN/SULBACTAM 16 INTERMEDIATE Intermediate     PIP/TAZO <=4 SENSITIVE Sensitive     * ESCHERICHIA COLI  Blood Culture ID Panel (Reflexed)     Status: Abnormal   Collection Time: 07/22/22  1:16 PM  Result Value Ref Range Status   Enterococcus faecalis NOT DETECTED NOT DETECTED Final   Enterococcus Faecium NOT DETECTED NOT DETECTED Final   Listeria monocytogenes NOT DETECTED NOT DETECTED Final   Staphylococcus species NOT DETECTED NOT DETECTED Final   Staphylococcus aureus (BCID) NOT DETECTED NOT DETECTED Final   Staphylococcus epidermidis NOT DETECTED NOT DETECTED Final   Staphylococcus lugdunensis NOT DETECTED NOT DETECTED Final   Streptococcus species NOT DETECTED NOT DETECTED Final   Streptococcus agalactiae NOT DETECTED NOT DETECTED Final   Streptococcus pneumoniae NOT DETECTED NOT DETECTED Final   Streptococcus pyogenes NOT DETECTED NOT DETECTED Final   A.calcoaceticus-baumannii NOT DETECTED NOT DETECTED Final   Bacteroides fragilis NOT DETECTED NOT DETECTED Final   Enterobacterales DETECTED (A) NOT DETECTED Final    Comment: Enterobacterales represent a large order of gram negative bacteria, not a  single organism. CRITICAL RESULT CALLED TO, READ BACK BY AND VERIFIED WITH: PHARMD G. ABBOTT 07/23/22 @ 0136 BY AB    Enterobacter cloacae complex NOT DETECTED NOT DETECTED Final   Escherichia coli DETECTED (A) NOT DETECTED Final    Comment: CRITICAL RESULT CALLED TO, READ BACK BY AND VERIFIED WITH: PHARMD G. ABBOTT 07/23/22 @ 0136 BY AB    Klebsiella aerogenes NOT DETECTED NOT DETECTED Final   Klebsiella oxytoca NOT DETECTED NOT DETECTED Final   Klebsiella pneumoniae NOT DETECTED NOT DETECTED Final   Proteus species NOT DETECTED NOT DETECTED Final   Salmonella species NOT DETECTED NOT DETECTED Final   Serratia marcescens NOT DETECTED NOT DETECTED Final   Haemophilus influenzae NOT DETECTED NOT DETECTED Final   Neisseria meningitidis NOT DETECTED NOT DETECTED Final   Pseudomonas aeruginosa NOT DETECTED NOT DETECTED Final   Stenotrophomonas maltophilia NOT DETECTED NOT DETECTED Final   Candida albicans NOT DETECTED NOT DETECTED Final   Candida auris NOT DETECTED NOT DETECTED Final   Candida glabrata NOT DETECTED NOT DETECTED Final   Candida krusei NOT DETECTED NOT DETECTED Final   Candida parapsilosis NOT DETECTED NOT DETECTED Final   Candida tropicalis NOT DETECTED NOT DETECTED Final   Cryptococcus neoformans/gattii NOT DETECTED NOT DETECTED Final   CTX-M ESBL NOT DETECTED NOT DETECTED Final   Carbapenem resistance IMP NOT DETECTED NOT DETECTED Final   Carbapenem resistance KPC NOT DETECTED NOT DETECTED Final   Carbapenem resistance NDM NOT DETECTED NOT DETECTED Final   Carbapenem resist OXA 48 LIKE NOT DETECTED NOT DETECTED Final   Carbapenem resistance VIM NOT DETECTED NOT DETECTED Final    Comment: Performed at [facility], [address] [place], [place] [address]  Blood Culture (routine x 2)     Status: Abnormal   Collection Time: 07/22/22  4:21 PM   Specimen:  BLOOD RIGHT ARM  Result Value Ref Range Status   Specimen Description BLOOD RIGHT ARM  Final   Special  Requests   Final    BOTTLES DRAWN AEROBIC AND ANAEROBIC Blood Culture results may not be optimal due to an inadequate volume of blood received in culture bottles   Culture  Setup Time   Final    GRAM NEGATIVE RODS IN BOTH AEROBIC AND ANAEROBIC BOTTLES CRITICAL VALUE NOTED.  VALUE IS CONSISTENT WITH PREVIOUSLY REPORTED AND CALLED VALUE.    Culture (A)  Final    ESCHERICHIA COLI SUSCEPTIBILITIES PERFORMED ON PREVIOUS CULTURE WITHIN THE LAST 5 DAYS. Performed at [facility], [place] [address], [place], [place] [address]    Report Status 07/25/2022 FINAL  Final  MRSA Next Gen by PCR, Nasal     Status: None   Collection Time: 07/24/22 10:15 AM   Specimen: Nasal Mucosa; Nasal Swab  Result Value Ref Range Status   MRSA by PCR Next Gen NOT DETECTED NOT DETECTED Final    Comment: (NOTE) The GeneXpert MRSA Assay (FDA approved for NASAL specimens only), is one component of a comprehensive MRSA colonization surveillance program. It is not intended to diagnose MRSA infection nor to guide or monitor treatment for MRSA infections. Test performance is not FDA approved in patients less than 19 years old. Performed at [facility], [place] [address], [place], [place] [address]          Radiology Studies: No results found.      Scheduled Meds:  acidophilus  2 capsule Oral Daily   busPIRone  10 mg Oral TID   clopidogrel  75 mg Oral q morning   divalproex  250 mg Oral BID   gabapentin  100 mg Oral TID   Gerhardt's butt cream  1 Application Topical BID   pantoprazole  40 mg Oral Daily   Continuous Infusions:  cefTRIAXone (ROCEPHIN)  IV 2 g (07/24/22 1714)     LOS: 3 days    Time spent: 45 minutes spent on chart review, discussion with nursing staff, consultants, updating family and interview/physical exam; more than 50% of that time was spent in counseling and/or coordination of care.    Geradine Girt, DO [facility] Available via Epic secure chat 7am-7pm After  these hours, please refer to coverage provider listed on [email or web address] 07/25/2022, 12:45 PM

## 2022-07-25 NOTE — Care Management Important Message (Signed)
Important Message  Patient Details  Name: [name] [name] MRN: [id number] Date of Birth: 1940/08/16   Medicare Important Message Given:  Yes     Aarian Griffie [address] 07/25/2022, 3:43 PM

## 2022-07-26 ENCOUNTER — Other Ambulatory Visit (HOSPITAL_COMMUNITY): Payer: Self-pay

## 2022-07-26 DIAGNOSIS — B9620 Unspecified Escherichia coli [E. coli] as the cause of diseases classified elsewhere: Secondary | ICD-10-CM

## 2022-07-26 DIAGNOSIS — R7881 Bacteremia: Secondary | ICD-10-CM | POA: Diagnosis not present

## 2022-07-26 DIAGNOSIS — A419 Sepsis, unspecified organism: Secondary | ICD-10-CM | POA: Diagnosis not present

## 2022-07-26 DIAGNOSIS — G9341 Metabolic encephalopathy: Secondary | ICD-10-CM | POA: Diagnosis not present

## 2022-07-26 DIAGNOSIS — N179 Acute kidney failure, unspecified: Secondary | ICD-10-CM | POA: Diagnosis not present

## 2022-07-26 LAB — BASIC METABOLIC PANEL
Anion gap: 9 (ref 5–15)
BUN: 5 mg/dL — ABNORMAL LOW (ref 8–23)
CO2: 23 mmol/L (ref 22–32)
Calcium: 8.5 mg/dL — ABNORMAL LOW (ref 8.9–10.3)
Chloride: 111 mmol/L (ref 98–111)
Creatinine, Ser: 0.81 mg/dL (ref 0.44–1.00)
GFR, Estimated: >60 mL/min (ref >60)
Glucose, Bld: 94 mg/dL (ref 70–99)
Potassium: 3.7 mmol/L (ref 3.5–5.1)
Sodium: 143 mmol/L (ref 135–145)

## 2022-07-26 LAB — CBC
HCT: 30 % — ABNORMAL LOW (ref 36.0–46.0)
Hemoglobin: 10.6 g/dL — ABNORMAL LOW (ref 12.0–15.0)
MCH: 34.8 pg — ABNORMAL HIGH (ref 26.0–34.0)
MCHC: 35.3 g/dL (ref 30.0–36.0)
MCV: 98.4 fL (ref 80.0–100.0)
Platelets: 110 10*3/uL — ABNORMAL LOW (ref 150–400)
RBC: 3.05 MIL/uL — ABNORMAL LOW (ref 3.87–5.11)
RDW: 14.2 % (ref 11.5–15.5)
WBC: 5.4 10*3/uL (ref 4.0–10.5)
nRBC: 0 % (ref 0.0–0.2)

## 2022-07-26 MED ORDER — CEFDINIR 300 MG PO CAPS
300.0000 mg | ORAL_CAPSULE | Freq: Two times a day (BID) | ORAL | Status: DC
  Administered 2022-07-26: 300 mg via ORAL
  Filled 2022-07-26: qty 1

## 2022-07-26 MED ORDER — LOPERAMIDE HCL 2 MG PO CAPS
2.0000 mg | ORAL_CAPSULE | ORAL | 0 refills | Status: DC | PRN
  Filled 2022-07-26: qty 30, 30d supply, fill #0

## 2022-07-26 MED ORDER — CEFDINIR 300 MG PO CAPS
300.0000 mg | ORAL_CAPSULE | Freq: Two times a day (BID) | ORAL | 0 refills | Status: AC
  Filled 2022-07-26: qty 6, 3d supply, fill #0

## 2022-07-26 NOTE — Progress Notes (Signed)
Patient discharged home with her caregiver and daughter.

## 2022-07-26 NOTE — TOC Transition Note (Signed)
Transition of Care [facility]) - CM/SW Discharge Note   Patient Details  Name: [name] [name] MRN: [id number] Date of Birth: 03/17/1941  Transition of [facility] CM/SW Contact:  Cyndi Bender, RN Phone Number: 07/26/2022, 12:22 PM   Clinical Narrative:     Spoke to patient regarding transition needs.  Patient was active with [facility]. [facility] with [facility] notified of discharge.  Patient uses [facility] for caregivers. This RNCM spoke to [place] at [place] who states she will transport home.   Final next level of care: Home w Home Health Services Barriers to Discharge: Barriers Resolved   Patient Goals and CMS Choice CMS Medicare.gov Compare Post Acute Care list provided to:: Patient Choice offered to / list presented to : Patient  Discharge Placement              HOme           Discharge Plan and Services Additional resources added to the After Visit Summary for   In-house Referral: Clinical Social Work Discharge Planning Services: CM Consult Post Acute Care Choice: Home Health                    HH Arranged: PT, OT [facility] Agency: [facility] Date [facility] Agency Contacted: 07/26/22 Time [place]: 1221 Representative spoke with at [place]: [place] Determinants of Health ([place]) Interventions [place]: Food Insecurity Present (05/02/2022)  Housing: Medium Risk (05/02/2022)  Transportation Needs: Unmet Transportation Needs (05/02/2022)  Utilities: Not At Risk (05/02/2022)  Depression (PHQ2-9): Low Risk  (05/26/2019)  Stress: Stress Concern Present (03/22/2018)  Tobacco Use: Medium Risk (07/22/2022)     Readmission Risk Interventions    07/26/2022   12:20 PM 05/09/2022    3:18 PM 05/08/2022    1:31 PM  Readmission Risk Prevention Plan  Post Dischage Appt Complete Complete   Medication Screening Complete Complete   Transportation Screening Complete  Complete  PCP or Specialist Appt within 5-7 Days    Complete  Home Care Screening   Complete  Medication Review (RN CM)   Complete

## 2022-07-26 NOTE — Plan of Care (Signed)
Pt discharging to home. Problem: Education: Goal: Knowledge of General Education information will improve Description: Including pain rating scale, medication(s)/side effects and non-pharmacologic comfort measures Outcome: Adequate for Discharge   Problem: Health Behavior/Discharge Planning: Goal: Ability to manage health-related needs will improve Outcome: Adequate for Discharge   Problem: Clinical Measurements: Goal: Ability to maintain clinical measurements within normal limits will improve Outcome: Adequate for Discharge Goal: Will remain free from infection Outcome: Adequate for Discharge Goal: Diagnostic test results will improve Outcome: Adequate for Discharge Goal: Respiratory complications will improve Outcome: Adequate for Discharge Goal: Cardiovascular complication will be avoided Outcome: Adequate for Discharge   Problem: Activity: Goal: Risk for activity intolerance will decrease Outcome: Adequate for Discharge   Problem: Nutrition: Goal: Adequate nutrition will be maintained Outcome: Adequate for Discharge   Problem: Coping: Goal: Level of anxiety will decrease Outcome: Adequate for Discharge   Problem: Elimination: Goal: Will not experience complications related to bowel motility Outcome: Adequate for Discharge Goal: Will not experience complications related to urinary retention Outcome: Adequate for Discharge   Problem: Pain Managment: Goal: General experience of comfort will improve Outcome: Adequate for Discharge   Problem: Safety: Goal: Ability to remain free from injury will improve Outcome: Adequate for Discharge   Problem: Skin Integrity: Goal: Risk for impaired skin integrity will decrease Outcome: Adequate for Discharge

## 2022-07-26 NOTE — Progress Notes (Signed)
Mobility Specialist Progress Note:    07/26/22 0900  Mobility  Activity Ambulated with assistance to bathroom (Transfered from BR> C.)  Level of Assistance Contact guard assist, steadying assist  Assistive Device Front wheel walker  Distance Ambulated (ft) 20 ft  Activity Response Tolerated well  Mobility Referral Yes  $Mobility charge 1 Mobility   Pt requested assistance to bathroom. Pt was asx thoughout, tolerated well. Transferred pt to chair with call bell in reach, all needs met.   Royetta Crochet Mobility Specialist Please contact via Solicitor or  Rehab office at [phone]

## 2022-07-26 NOTE — Discharge Summary (Signed)
PATIENT DETAILS Name: [name] [name] Age: 82 y.o. Sex: adult Date of Birth: 11-04-1940 MRN: [id number]. Admitting Physician: Geradine Girt, DO WUJ:[id number], Dorothea Ogle, NP  Admit Date: 07/22/2022 Discharge date: 07/26/2022  Recommendations for Outpatient Follow-up:  Follow up with PCP in 1-2 weeks Please obtain CMP/CBC in one week  Admitted From:  Home  Disposition: Home health   Discharge Condition: fair  CODE STATUS:   Code Status: DNR   Diet recommendation:  Diet Order             Diet - low sodium heart healthy           Diet regular Room service appropriate? Yes with Assist; Fluid consistency: Thin  Diet effective now                    Brief Summary: 82 year old with history of HTN-who was brought with acute metabolic encephalopathy in the setting of sepsis due to E. coli bacteremia.  Brief Hospital Course: Sepsis-E. coli bacteremia in the setting of complicated UTI Sepsis physiology has resolved with IV antibiotics/IV fluids E. coli in urine and blood cultures-CT abdomen without any other infective sources including any major hepatobiliary findings. Will switch to cefdinir on discharge.  Acute metabolic encephalopathy Due to sepsis/E. coli bacteremia Encephalopathy has resolved with IV antibiotics-she is completely awake/alert  AKI on CKD stage IIIb AKI hemodynamically mediated Creatinine has normalized with supportive care  Mild intermittent diarrhea Resolved-probably related to IV antibiotics.  Microcytic anemia Recent B12/folate levels October 2023 stable. Defer further workup to PCP-May need outpatient hematology evaluation  PAD Continue aspirin/Plavix  HTN BP stable Continue amlodipine  History of PMR  Obesity: Estimated body mass index is 27.73 kg/m as calculated from the following:   Height as of this encounter: 5' (1.524 m).   Weight as of this encounter: 64.4 kg.    Discharge Diagnoses:  Principal Problem:   Sepsis  [facility])   Discharge Instructions:  Activity:  As tolerated with Full fall precautions use walker/cane & assistance as needed  Discharge Instructions     Call MD for:  difficulty breathing, headache or visual disturbances   Complete by: As directed    Call MD for:  persistant dizziness or light-headedness   Complete by: As directed    Call MD for:  temperature >100.4   Complete by: As directed    Diet - low sodium heart healthy   Complete by: As directed    Discharge instructions   Complete by: As directed    Follow with Primary MD  Emelia Loron, NP in 1-2 weeks  Please get a complete blood count and chemistry panel checked by your Primary MD at your next visit, and again as instructed by your Primary MD.  Get Medicines reviewed and adjusted: Please take all your medications with you for your next visit with your Primary MD  Laboratory/radiological data: Please request your Primary MD to go over all hospital tests and procedure/radiological results at the follow up, please ask your Primary MD to get all Hospital records sent to his/her office.  In some cases, they will be blood work, cultures and biopsy results pending at the time of your discharge. Please request that your primary care M.D. follows up on these results.  Also Note the following: If you experience worsening of your admission symptoms, develop shortness of breath, life threatening emergency, suicidal or homicidal thoughts you must seek medical attention immediately by calling 911 or calling your MD immediately  if symptoms less severe.  You must read complete instructions/literature along with all the possible adverse reactions/side effects for all the Medicines you take and that have been prescribed to you. Take any new Medicines after you have completely understood and accpet all the possible adverse reactions/side effects.   Do not drive when taking Pain medications or sleeping medications (Benzodaizepines)  Do  not take more than prescribed Pain, Sleep and Anxiety Medications. It is not advisable to combine anxiety,sleep and pain medications without talking with your primary care practitioner  Special Instructions: If you have smoked or chewed Tobacco  in the last 2 yrs please stop smoking, stop any regular Alcohol  and or any Recreational drug use.  Wear Seat belts while driving.  Please note: You were cared for by a hospitalist during your hospital stay. Once you are discharged, your primary care physician will handle any further medical issues. Please note that NO REFILLS for any discharge medications will be authorized once you are discharged, as it is imperative that you return to your primary care physician (or establish a relationship with a primary care physician if you do not have one) for your post hospital discharge needs so that they can reassess your need for medications and monitor your lab values.   Increase activity slowly   Complete by: As directed       Allergies as of 07/26/2022       Reactions   Statins    Gabapentin Itching   Was deleted since patient was taking, but now experiencing itching again.    Statins Swelling, Rash, Other (See Comments)   Swelling involving tongue; also causes muscle pain        Medication List     TAKE these medications    acetaminophen 500 MG tablet Commonly known as: TYLENOL Take 500 mg by mouth every 8 (eight) hours as needed for moderate pain.   amLODipine 2.5 MG tablet Commonly known as: NORVASC Take 1 tablet (2.5 mg total) by mouth daily.   busPIRone 10 MG tablet Commonly known as: BUSPAR Take 10 mg by mouth 3 (three) times daily.   cefdinir 300 MG capsule Commonly known as: OMNICEF Take 1 capsule (300 mg total) by mouth every 12 (twelve) hours for 3 days.   clopidogrel 75 MG tablet Commonly known as: PLAVIX Take 1 tablet (75 mg total) by mouth every morning.   divalproex 125 MG DR tablet Commonly known as:  DEPAKOTE Take 250 mg by mouth 2 (two) times daily.   gabapentin 100 MG capsule Commonly known as: NEURONTIN Take 200 mg by mouth 3 (three) times daily.   HYDROcodone-acetaminophen 5-325 MG tablet Commonly known as: NORCO/VICODIN Take 1 tablet by mouth every 6 (six) hours as needed for moderate pain or severe pain (to be given 30 mini prior to dresssing changes).   loperamide 2 MG capsule Commonly known as: IMODIUM Take 1 capsule (2 mg total) by mouth as needed for diarrhea or loose stools.   pantoprazole 40 MG tablet Commonly known as: PROTONIX Take 40 mg by mouth daily.   traMADol 50 MG tablet Commonly known as: ULTRAM Take 50 mg by mouth 3 (three) times daily as needed for moderate pain.        Follow-up Information     Emelia Loron, NP. Schedule an appointment as soon as possible for a visit in 1 week(s).   Specialty: Nurse Practitioner Contact information: [place] [address] [phone]  Allergies  Allergen Reactions   Statins    Gabapentin Itching    Was deleted since patient was taking, but now experiencing itching again.    Statins Swelling, Rash and Other (See Comments)    Swelling involving tongue; also causes muscle pain     Other Procedures/Studies: CT Head Wo Contrast  Result Date: 07/22/2022 CLINICAL DATA:  Head trauma, moderate-severe EXAM: CT HEAD WITHOUT CONTRAST TECHNIQUE: Contiguous axial images were obtained from the base of the skull through the vertex without intravenous contrast. RADIATION DOSE REDUCTION: This exam was performed according to the departmental dose-optimization program which includes automated exposure control, adjustment of the mA and/or kV according to patient size and/or use of iterative reconstruction technique. COMPARISON:  11/21/2020 FINDINGS: Brain: There is atrophy and chronic small vessel disease changes. No acute intracranial abnormality. Specifically, no hemorrhage,  hydrocephalus, mass lesion, acute infarction, or significant intracranial injury. Vascular: No hyperdense vessel or unexpected calcification. Skull: No acute calvarial abnormality. Sinuses/Orbits: No acute findings Other: None IMPRESSION: Atrophy, chronic microvascular disease. No acute intracranial abnormality. Electronically Signed   By: Rolm Baptise M.D.   On: 07/22/2022 17:25   CT CHEST ABDOMEN PELVIS W CONTRAST  Result Date: 07/22/2022 CLINICAL DATA:  Sepsis. EXAM: CT CHEST, ABDOMEN, AND PELVIS WITH CONTRAST TECHNIQUE: Multidetector CT imaging of the chest, abdomen and pelvis was performed following the standard protocol during bolus administration of intravenous contrast. RADIATION DOSE REDUCTION: This exam was performed according to the departmental dose-optimization program which includes automated exposure control, adjustment of the mA and/or kV according to patient size and/or use of iterative reconstruction technique. CONTRAST:  59m OMNIPAQUE IOHEXOL 350 MG/ML SOLN COMPARISON:  Chest CT dated 11/21/2020. CT abdomen and pelvis dated 10/27/2020. FINDINGS: CT CHEST FINDINGS Cardiovascular: No thoracic aortic aneurysm or evidence of aortic dissection. Scattered aortic atherosclerosis. No pericardial effusion. Three-vessel coronary artery calcifications. Mediastinum/Nodes: No mass or enlarged lymph nodes are seen within the mediastinum or perihilar regions. Esophagus appears normal. Trachea and central bronchi are unremarkable. Lungs/Pleura: Mild bibasilar atelectasis. Lungs are otherwise clear. No pleural effusion or pneumothorax. Musculoskeletal: Degenerative spondylosis of the scoliotic thoracic spine, mild to moderate in degree. No acute findings. CT ABDOMEN PELVIS FINDINGS Hepatobiliary: No focal liver abnormality is seen. Gallbladder is unremarkable. No bile duct dilatation. Pancreas: Unremarkable. No pancreatic ductal dilatation or surrounding inflammatory changes. Spleen: Mildly prominent in size  but otherwise unremarkable. Adrenals/Urinary Tract: Adrenal glands appear normal. LEFT kidney is atrophic. RIGHT kidney is unremarkable without suspicious mass, stone or hydronephrosis. No ureteral or bladder calculi are identified. Bladder walls appear mildly thickened circumferentially, but characterization is limited by bladder decompression. Stomach/Bowel: No dilated large or small bowel loops. Scattered diverticulosis of the descending and sigmoid colon but no focal inflammatory change is seen to suggest acute diverticulitis. No evidence of a focal or generalized bowel wall inflammation. Stomach is unremarkable, partially decompressed. Appendix is not seen but there are no inflammatory changes about the cecum to suggest acute appendicitis. Vascular/Lymphatic: Aortic atherosclerosis. No abdominal aortic aneurysm. No acute-appearing vascular abnormality. No enlarged lymph nodes are seen in the abdomen or pelvis. Reproductive: No mass or free fluid. Other: No free fluid or abscess collection. No free intraperitoneal air. Musculoskeletal: Degenerative spondylosis of the slightly scoliotic lumbar spine, at least moderate in degree. No acute findings. IMPRESSION: 1. Bladder walls appear mildly thickened circumferentially, but characterization is limited by bladder decompression. Recommend correlation with urinalysis to exclude cystitis. 2. Otherwise, no acute findings within the chest, abdomen or pelvis. No other possible  source for sepsis identified. 3. Colonic diverticulosis without evidence of acute diverticulitis. 4. Three-vessel coronary artery calcifications. 5. LEFT renal atrophy. 6. Degenerative spondylosis of the scoliotic thoracic and lumbar spine, at least moderate in degree. Electronically Signed   By: Franki Cabot M.D.   On: 07/22/2022 14:28   DG Chest Port 1 View  Result Date: 07/22/2022 CLINICAL DATA:  Questionable sepsis - evaluate for abnormality EXAM: PORTABLE CHEST 1 VIEW COMPARISON:   05/01/2022 FINDINGS: The heart size and mediastinal contours are within normal limits. Aortic atherosclerosis. Small streaky opacity at the periphery of the right lung base. Minimally increased left basilar interstitial markings. No pleural effusion or pneumothorax. The visualized skeletal structures are unremarkable. IMPRESSION: Small streaky opacity at the periphery of the right lung base, which may represent atelectasis or pneumonia. Electronically Signed   By: Davina Poke D.O.   On: 07/22/2022 11:26     TODAY-DAY OF DISCHARGE:  Subjective:   [facility] today has no headache,no chest abdominal pain,no new weakness tingling or numbness, feels much better wants to go home today.   Objective:   Blood pressure (!) 150/79, pulse 95, temperature 98.3 F (36.8 C), temperature source Oral, resp. rate (!) 80, height 5' (1.524 m), weight 64.4 kg, SpO2 95 %.  Intake/Output Summary (Last 24 hours) at 07/26/2022 1019 Last data filed at 07/26/2022 0919 Gross per 24 hour  Intake 480 ml  Output 1500 ml  Net -1020 ml   Filed Weights   07/22/22 1044  Weight: 64.4 kg    Exam: Awake Alert, Oriented *3, No new F.N deficits, Normal affect [place].AT,PERRAL Supple Neck,No JVD, No cervical lymphadenopathy appriciated.  Symmetrical Chest wall movement, Good air movement bilaterally, CTAB RRR,No Gallops,Rubs or new Murmurs, No Parasternal Heave +ve B.Sounds, Abd Soft, Non tender, No organomegaly appriciated, No rebound -guarding or rigidity. No Cyanosis, Clubbing or edema, No new Rash or bruise   PERTINENT RADIOLOGIC STUDIES: No results found.   PERTINENT LAB RESULTS: CBC: Recent Labs    07/25/22 0828 07/26/22 0757  WBC 5.0 5.4  HGB 11.5* 10.6*  HCT 32.5* 30.0*  PLT 112* 110*   CMET CMP     Component Value Date/Time   NA 143 07/26/2022 0757   NA 141 06/03/2019 1047   K 3.7 07/26/2022 0757   CL 111 07/26/2022 0757   CO2 23 07/26/2022 0757   GLUCOSE 94 07/26/2022 0757   BUN 5 (L)  07/26/2022 0757   BUN 15 06/03/2019 1047   CREATININE 0.81 07/26/2022 0757   CREATININE 1.07 (H) 08/01/2016 1512   CALCIUM 8.5 (L) 07/26/2022 0757   PROT 5.7 (L) 07/23/2022 0408   PROT 6.3 06/03/2019 1047   ALBUMIN 2.7 (L) 07/23/2022 0408   ALBUMIN 4.3 06/03/2019 1047   AST 19 07/23/2022 0408   ALT 9 07/23/2022 0408   ALKPHOS 40 07/23/2022 0408   BILITOT 0.6 07/23/2022 0408   BILITOT 0.5 06/03/2019 1047   GFRNONAA >60 07/26/2022 0757   GFRNONAA 50 (L) 08/01/2016 1512   GFRAA >60 09/08/2019 0133   GFRAA 58 (L) 08/01/2016 1512    GFR Estimated Creatinine Clearance (by C-G formula based on SCr of 0.81 mg/dL) Female: 44.1 mL/min Female: 54.5 mL/min No results for input(s): "LIPASE", "AMYLASE" in the last 72 hours. No results for input(s): "CKTOTAL", "CKMB", "CKMBINDEX", "TROPONINI" in the last 72 hours. Invalid input(s): "POCBNP" No results for input(s): "DDIMER" in the last 72 hours. No results for input(s): "HGBA1C" in the last 72 hours. No results for input(s): "  CHOL", "HDL", "LDLCALC", "TRIG", "CHOLHDL", "LDLDIRECT" in the last 72 hours. No results for input(s): "TSH", "T4TOTAL", "T3FREE", "THYROIDAB" in the last 72 hours.  Invalid input(s): "FREET3" No results for input(s): "VITAMINB12", "FOLATE", "FERRITIN", "TIBC", "IRON", "RETICCTPCT" in the last 72 hours. Coags: No results for input(s): "INR" in the last 72 hours.  Invalid input(s): "PT" Microbiology: Recent Results (from the past 240 hour(s))  Urine Culture     Status: Abnormal   Collection Time: 07/22/22 10:52 AM   Specimen: In/Out Cath Urine  Result Value Ref Range Status   Specimen Description IN/OUT CATH URINE  Final   Special Requests   Final    NONE Performed at [facility], [address] [place], [place] [address]    Culture 80,000 COLONIES/mL ESCHERICHIA COLI (A)  Final   Report Status 07/24/2022 FINAL  Final   Organism ID, Bacteria ESCHERICHIA COLI (A)  Final      Susceptibility   Escherichia  coli - MIC*    AMPICILLIN >=32 RESISTANT Resistant     CEFAZOLIN <=4 SENSITIVE Sensitive     CEFEPIME <=0.12 SENSITIVE Sensitive     CEFTRIAXONE <=0.25 SENSITIVE Sensitive     CIPROFLOXACIN <=0.25 SENSITIVE Sensitive     GENTAMICIN <=1 SENSITIVE Sensitive     IMIPENEM <=0.25 SENSITIVE Sensitive     NITROFURANTOIN <=16 SENSITIVE Sensitive     TRIMETH/SULFA >=320 RESISTANT Resistant     AMPICILLIN/SULBACTAM 16 INTERMEDIATE Intermediate     PIP/TAZO <=4 SENSITIVE Sensitive     * 80,000 COLONIES/mL ESCHERICHIA COLI  Blood Culture (routine x 2)     Status: Abnormal   Collection Time: 07/22/22  1:16 PM   Specimen: BLOOD LEFT FOREARM  Result Value Ref Range Status   Specimen Description BLOOD LEFT FOREARM  Final   Special Requests   Final    BOTTLES DRAWN AEROBIC AND ANAEROBIC Blood Culture adequate volume   Culture  Setup Time   Final    GRAM NEGATIVE RODS IN BOTH AEROBIC AND ANAEROBIC BOTTLES CRITICAL RESULT CALLED TO, READ BACK BY AND VERIFIED WITH: PHARMD G. ABBOTT 07/23/22 @ 0136 BY AB Performed at [facility], [place] [address], [place], [place] [address]    Culture ESCHERICHIA COLI (A)  Final   Report Status 07/25/2022 FINAL  Final   Organism ID, Bacteria ESCHERICHIA COLI  Final      Susceptibility   Escherichia coli - MIC*    AMPICILLIN >=32 RESISTANT Resistant     CEFAZOLIN <=4 SENSITIVE Sensitive     CEFEPIME <=0.12 SENSITIVE Sensitive     CEFTAZIDIME <=1 SENSITIVE Sensitive     CEFTRIAXONE <=0.25 SENSITIVE Sensitive     CIPROFLOXACIN <=0.25 SENSITIVE Sensitive     GENTAMICIN <=1 SENSITIVE Sensitive     IMIPENEM <=0.25 SENSITIVE Sensitive     TRIMETH/SULFA >=320 RESISTANT Resistant     AMPICILLIN/SULBACTAM 16 INTERMEDIATE Intermediate     PIP/TAZO <=4 SENSITIVE Sensitive     * ESCHERICHIA COLI  Blood Culture ID Panel (Reflexed)     Status: Abnormal   Collection Time: 07/22/22  1:16 PM  Result Value Ref Range Status   Enterococcus faecalis NOT DETECTED NOT DETECTED  Final   Enterococcus Faecium NOT DETECTED NOT DETECTED Final   Listeria monocytogenes NOT DETECTED NOT DETECTED Final   Staphylococcus species NOT DETECTED NOT DETECTED Final   Staphylococcus aureus (BCID) NOT DETECTED NOT DETECTED Final   Staphylococcus epidermidis NOT DETECTED NOT DETECTED Final   Staphylococcus lugdunensis NOT DETECTED NOT DETECTED Final  Streptococcus species NOT DETECTED NOT DETECTED Final   Streptococcus agalactiae NOT DETECTED NOT DETECTED Final   Streptococcus pneumoniae NOT DETECTED NOT DETECTED Final   Streptococcus pyogenes NOT DETECTED NOT DETECTED Final   A.calcoaceticus-baumannii NOT DETECTED NOT DETECTED Final   Bacteroides fragilis NOT DETECTED NOT DETECTED Final   Enterobacterales DETECTED (A) NOT DETECTED Final    Comment: Enterobacterales represent a large order of gram negative bacteria, not a single organism. CRITICAL RESULT CALLED TO, READ BACK BY AND VERIFIED WITH: PHARMD G. ABBOTT 07/23/22 @ 0136 BY AB    Enterobacter cloacae complex NOT DETECTED NOT DETECTED Final   Escherichia coli DETECTED (A) NOT DETECTED Final    Comment: CRITICAL RESULT CALLED TO, READ BACK BY AND VERIFIED WITH: PHARMD G. ABBOTT 07/23/22 @ 0136 BY AB    Klebsiella aerogenes NOT DETECTED NOT DETECTED Final   Klebsiella oxytoca NOT DETECTED NOT DETECTED Final   Klebsiella pneumoniae NOT DETECTED NOT DETECTED Final   Proteus species NOT DETECTED NOT DETECTED Final   Salmonella species NOT DETECTED NOT DETECTED Final   Serratia marcescens NOT DETECTED NOT DETECTED Final   Haemophilus influenzae NOT DETECTED NOT DETECTED Final   Neisseria meningitidis NOT DETECTED NOT DETECTED Final   Pseudomonas aeruginosa NOT DETECTED NOT DETECTED Final   Stenotrophomonas maltophilia NOT DETECTED NOT DETECTED Final   Candida albicans NOT DETECTED NOT DETECTED Final   Candida auris NOT DETECTED NOT DETECTED Final   Candida glabrata NOT DETECTED NOT DETECTED Final   Candida krusei NOT  DETECTED NOT DETECTED Final   Candida parapsilosis NOT DETECTED NOT DETECTED Final   Candida tropicalis NOT DETECTED NOT DETECTED Final   Cryptococcus neoformans/gattii NOT DETECTED NOT DETECTED Final   CTX-M ESBL NOT DETECTED NOT DETECTED Final   Carbapenem resistance IMP NOT DETECTED NOT DETECTED Final   Carbapenem resistance KPC NOT DETECTED NOT DETECTED Final   Carbapenem resistance NDM NOT DETECTED NOT DETECTED Final   Carbapenem resist OXA 48 LIKE NOT DETECTED NOT DETECTED Final   Carbapenem resistance VIM NOT DETECTED NOT DETECTED Final    Comment: Performed at [facility], [address] [place], [place] [address]  Blood Culture (routine x 2)     Status: Abnormal   Collection Time: 07/22/22  4:21 PM   Specimen: BLOOD RIGHT ARM  Result Value Ref Range Status   Specimen Description BLOOD RIGHT ARM  Final   Special Requests   Final    BOTTLES DRAWN AEROBIC AND ANAEROBIC Blood Culture results may not be optimal due to an inadequate volume of blood received in culture bottles   Culture  Setup Time   Final    GRAM NEGATIVE RODS IN BOTH AEROBIC AND ANAEROBIC BOTTLES CRITICAL VALUE NOTED.  VALUE IS CONSISTENT WITH PREVIOUSLY REPORTED AND CALLED VALUE.    Culture (A)  Final    ESCHERICHIA COLI SUSCEPTIBILITIES PERFORMED ON PREVIOUS CULTURE WITHIN THE LAST 5 DAYS. Performed at [facility], [place] [address] [place], [place] [address]    Report Status 07/25/2022 FINAL  Final  MRSA Next Gen by PCR, Nasal     Status: None   Collection Time: 07/24/22 10:15 AM   Specimen: Nasal Mucosa; Nasal Swab  Result Value Ref Range Status   MRSA by PCR Next Gen NOT DETECTED NOT DETECTED Final    Comment: (NOTE) The GeneXpert MRSA Assay (FDA approved for NASAL specimens only), is one component of a comprehensive MRSA colonization surveillance program. It is not intended to diagnose MRSA infection nor  to guide or monitor treatment for MRSA infections. Test performance is not FDA  approved in patients less than 1 years old. Performed at [facility], [place] [address], [place], [place] [address]     FURTHER DISCHARGE INSTRUCTIONS:  Get Medicines reviewed and adjusted: Please take all your medications with you for your next visit with your Primary MD  Laboratory/radiological data: Please request your Primary MD to go over all hospital tests and procedure/radiological results at the follow up, please ask your Primary MD to get all Hospital records sent to his/her office.  In some cases, they will be blood work, cultures and biopsy results pending at the time of your discharge. Please request that your primary care M.D. goes through all the records of your hospital data and follows up on these results.  Also Note the following: If you experience worsening of your admission symptoms, develop shortness of breath, life threatening emergency, suicidal or homicidal thoughts you must seek medical attention immediately by calling 911 or calling your MD immediately  if symptoms less severe.  You must read complete instructions/literature along with all the possible adverse reactions/side effects for all the Medicines you take and that have been prescribed to you. Take any new Medicines after you have completely understood and accpet all the possible adverse reactions/side effects.   Do not drive when taking Pain medications or sleeping medications (Benzodaizepines)  Do not take more than prescribed Pain, Sleep and Anxiety Medications. It is not advisable to combine anxiety,sleep and pain medications without talking with your primary care practitioner  Special Instructions: If you have smoked or chewed Tobacco  in the last 2 yrs please stop smoking, stop any regular Alcohol  and or any Recreational drug use.  Wear Seat belts while driving.  Please note: You were cared for by a hospitalist during your hospital stay. Once you are discharged, your primary care physician  will handle any further medical issues. Please note that NO REFILLS for any discharge medications will be authorized once you are discharged, as it is imperative that you return to your primary care physician (or establish a relationship with a primary care physician if you do not have one) for your post hospital discharge needs so that they can reassess your need for medications and monitor your lab values.  Total Time spent coordinating discharge including counseling, education and face to face time equals greater than 30 minutes.  SignedOren Binet 07/26/2022 10:19 AM

## 2022-08-02 ENCOUNTER — Other Ambulatory Visit (HOSPITAL_COMMUNITY): Payer: Self-pay

## 2022-09-14 ENCOUNTER — Other Ambulatory Visit: Payer: Self-pay

## 2022-09-14 ENCOUNTER — Observation Stay (HOSPITAL_COMMUNITY)
Admission: EM | Admit: 2022-09-14 | Discharge: 2022-09-16 | Disposition: A | Discharge status: Home / Self Care | Payer: HMO | Attending: Internal Medicine | Admitting: Internal Medicine

## 2022-09-14 ENCOUNTER — Ambulatory Visit: Payer: PPO | Admitting: Family Medicine

## 2022-09-14 ENCOUNTER — Emergency Department (HOSPITAL_COMMUNITY): Payer: HMO

## 2022-09-14 ENCOUNTER — Encounter (HOSPITAL_COMMUNITY): Payer: Self-pay

## 2022-09-14 DIAGNOSIS — Z87891 Personal history of nicotine dependence: Secondary | ICD-10-CM | POA: Insufficient documentation

## 2022-09-14 DIAGNOSIS — G4733 Obstructive sleep apnea (adult) (pediatric): Secondary | ICD-10-CM | POA: Diagnosis present

## 2022-09-14 DIAGNOSIS — R0789 Other chest pain: Secondary | ICD-10-CM | POA: Diagnosis present

## 2022-09-14 DIAGNOSIS — Z79899 Other long term (current) drug therapy: Secondary | ICD-10-CM | POA: Insufficient documentation

## 2022-09-14 DIAGNOSIS — Z7902 Long term (current) use of antithrombotics/antiplatelets: Secondary | ICD-10-CM | POA: Insufficient documentation

## 2022-09-14 DIAGNOSIS — M316 Other giant cell arteritis: Secondary | ICD-10-CM | POA: Diagnosis not present

## 2022-09-14 DIAGNOSIS — I129 Hypertensive chronic kidney disease with stage 1 through stage 4 chronic kidney disease, or unspecified chronic kidney disease: Secondary | ICD-10-CM | POA: Diagnosis not present

## 2022-09-14 DIAGNOSIS — H409 Unspecified glaucoma: Secondary | ICD-10-CM | POA: Insufficient documentation

## 2022-09-14 DIAGNOSIS — M542 Cervicalgia: Secondary | ICD-10-CM

## 2022-09-14 DIAGNOSIS — K219 Gastro-esophageal reflux disease without esophagitis: Secondary | ICD-10-CM | POA: Diagnosis present

## 2022-09-14 DIAGNOSIS — D696 Thrombocytopenia, unspecified: Secondary | ICD-10-CM | POA: Diagnosis not present

## 2022-09-14 DIAGNOSIS — R519 Headache, unspecified: Secondary | ICD-10-CM | POA: Diagnosis present

## 2022-09-14 DIAGNOSIS — H401133 Primary open-angle glaucoma, bilateral, severe stage: Secondary | ICD-10-CM | POA: Diagnosis present

## 2022-09-14 DIAGNOSIS — I10 Essential (primary) hypertension: Secondary | ICD-10-CM | POA: Diagnosis present

## 2022-09-14 DIAGNOSIS — N1832 Chronic kidney disease, stage 3b: Secondary | ICD-10-CM | POA: Diagnosis not present

## 2022-09-14 LAB — CBC WITH DIFFERENTIAL/PLATELET
Abs Immature Granulocytes: 0.01 10*3/uL (ref 0.00–0.07)
Basophils Absolute: 0 10*3/uL (ref 0.0–0.1)
Basophils Relative: 0 %
Eosinophils Absolute: 0.1 10*3/uL (ref 0.0–0.5)
Eosinophils Relative: 2 %
HCT: 37.3 % (ref 36.0–46.0)
Hemoglobin: 12.8 g/dL (ref 12.0–15.0)
Immature Granulocytes: 0 %
Lymphocytes Relative: 26 %
Lymphs Abs: 1.4 10*3/uL (ref 0.7–4.0)
MCH: 34.5 pg — ABNORMAL HIGH (ref 26.0–34.0)
MCHC: 34.3 g/dL (ref 30.0–36.0)
MCV: 100.5 fL — ABNORMAL HIGH (ref 80.0–100.0)
Monocytes Absolute: 0.5 10*3/uL (ref 0.1–1.0)
Monocytes Relative: 9 %
Neutro Abs: 3.4 10*3/uL (ref 1.7–7.7)
Neutrophils Relative %: 63 %
Platelets: 140 10*3/uL — ABNORMAL LOW (ref 150–400)
RBC: 3.71 MIL/uL — ABNORMAL LOW (ref 3.87–5.11)
RDW: 13.7 % (ref 11.5–15.5)
WBC: 5.3 10*3/uL (ref 4.0–10.5)
nRBC: 0 % (ref 0.0–0.2)

## 2022-09-14 LAB — COMPREHENSIVE METABOLIC PANEL
ALT: 9 U/L (ref 0–44)
AST: 16 U/L (ref 15–41)
Albumin: 4.2 g/dL (ref 3.5–5.0)
Alkaline Phosphatase: 62 U/L (ref 38–126)
Anion gap: 10 (ref 5–15)
BUN: 13 mg/dL (ref 8–23)
CO2: 24 mmol/L (ref 22–32)
Calcium: 9.4 mg/dL (ref 8.9–10.3)
Chloride: 102 mmol/L (ref 98–111)
Creatinine, Ser: 0.83 mg/dL (ref 0.44–1.00)
GFR, Estimated: >60 mL/min (ref >60)
Glucose, Bld: 101 mg/dL — ABNORMAL HIGH (ref 70–99)
Potassium: 3.5 mmol/L (ref 3.5–5.1)
Sodium: 136 mmol/L (ref 135–145)
Total Bilirubin: 0.9 mg/dL (ref 0.3–1.2)
Total Protein: 6.4 g/dL — ABNORMAL LOW (ref 6.5–8.1)

## 2022-09-14 LAB — SEDIMENTATION RATE: Sed Rate: 45 mm/hr — ABNORMAL HIGH (ref 0–22)

## 2022-09-14 LAB — TROPONIN I (HIGH SENSITIVITY)
Troponin I (High Sensitivity): 4 ng/L (ref <18)
Troponin I (High Sensitivity): 5 ng/L (ref <18)

## 2022-09-14 LAB — C-REACTIVE PROTEIN: CRP: 1.2 mg/dL — ABNORMAL HIGH (ref <1.0)

## 2022-09-14 LAB — LIPASE, BLOOD: Lipase: 38 U/L (ref 11–51)

## 2022-09-14 MED ORDER — ACETAMINOPHEN 500 MG PO TABS
1000.0000 mg | ORAL_TABLET | Freq: Once | ORAL | Status: AC
  Administered 2022-09-14: 1000 mg via ORAL
  Filled 2022-09-14: qty 2

## 2022-09-14 MED ORDER — ALBUTEROL SULFATE (2.5 MG/3ML) 0.083% IN NEBU: 2.5000 mg | INHALATION_SOLUTION | Freq: Every day | RESPIRATORY_TRACT | Status: DC | PRN

## 2022-09-14 MED ORDER — GABAPENTIN 100 MG PO CAPS
200.0000 mg | ORAL_CAPSULE | Freq: Three times a day (TID) | ORAL | Status: DC
  Administered 2022-09-14 – 2022-09-16 (×6): 200 mg via ORAL
  Filled 2022-09-14 (×6): qty 2

## 2022-09-14 MED ORDER — DIVALPROEX SODIUM 250 MG PO DR TAB
250.0000 mg | DELAYED_RELEASE_TABLET | Freq: Two times a day (BID) | ORAL | Status: DC
  Administered 2022-09-14 – 2022-09-16 (×4): 250 mg via ORAL
  Filled 2022-09-14 (×5): qty 1

## 2022-09-14 MED ORDER — PANTOPRAZOLE SODIUM 40 MG PO TBEC
40.0000 mg | DELAYED_RELEASE_TABLET | Freq: Every day | ORAL | Status: DC
  Administered 2022-09-14 – 2022-09-16 (×3): 40 mg via ORAL
  Filled 2022-09-14 (×3): qty 1

## 2022-09-14 MED ORDER — CLOPIDOGREL BISULFATE 75 MG PO TABS
75.0000 mg | ORAL_TABLET | Freq: Every morning | ORAL | Status: DC
  Administered 2022-09-14: 75 mg via ORAL
  Filled 2022-09-14: qty 1

## 2022-09-14 MED ORDER — ONDANSETRON HCL 4 MG/2ML IJ SOLN: 4.0000 mg | Freq: Four times a day (QID) | INTRAMUSCULAR | Status: DC | PRN

## 2022-09-14 MED ORDER — FENTANYL CITRATE PF 50 MCG/ML IJ SOSY
25.0000 ug | PREFILLED_SYRINGE | Freq: Once | INTRAMUSCULAR | Status: AC
  Administered 2022-09-14: 25 ug via INTRAVENOUS
  Filled 2022-09-14: qty 1

## 2022-09-14 MED ORDER — MAGNESIUM SULFATE 2 GM/50ML IV SOLN
2.0000 g | Freq: Once | INTRAVENOUS | Status: AC
  Administered 2022-09-14: 2 g via INTRAVENOUS
  Filled 2022-09-14: qty 50

## 2022-09-14 MED ORDER — ONDANSETRON HCL 4 MG PO TABS: 4.0000 mg | ORAL_TABLET | Freq: Four times a day (QID) | ORAL | Status: DC | PRN

## 2022-09-14 MED ORDER — SODIUM CHLORIDE (PF) 0.9 % IJ SOLN
INTRAMUSCULAR | Status: AC
  Filled 2022-09-14: qty 50

## 2022-09-14 MED ORDER — ACETAMINOPHEN 325 MG PO TABS
650.0000 mg | ORAL_TABLET | Freq: Four times a day (QID) | ORAL | Status: DC | PRN
  Administered 2022-09-14 – 2022-09-16 (×6): 650 mg via ORAL
  Filled 2022-09-14 (×6): qty 2

## 2022-09-14 MED ORDER — LACTATED RINGERS IV BOLUS
500.0000 mL | Freq: Once | INTRAVENOUS | Status: AC
  Administered 2022-09-14: 500 mL via INTRAVENOUS

## 2022-09-14 MED ORDER — METOCLOPRAMIDE HCL 5 MG/ML IJ SOLN
10.0000 mg | Freq: Once | INTRAMUSCULAR | Status: AC
  Administered 2022-09-14: 10 mg via INTRAVENOUS
  Filled 2022-09-14: qty 2

## 2022-09-14 MED ORDER — KETOROLAC TROMETHAMINE 15 MG/ML IJ SOLN
15.0000 mg | Freq: Once | INTRAMUSCULAR | Status: AC
  Administered 2022-09-14: 15 mg via INTRAVENOUS
  Filled 2022-09-14: qty 1

## 2022-09-14 MED ORDER — ACETAMINOPHEN 650 MG RE SUPP: 650.0000 mg | Freq: Four times a day (QID) | RECTAL | Status: DC | PRN

## 2022-09-14 MED ORDER — IOHEXOL 350 MG/ML SOLN
75.0000 mL | Freq: Once | INTRAVENOUS | Status: AC | PRN
  Administered 2022-09-14: 75 mL via INTRAVENOUS

## 2022-09-14 MED ORDER — FUROSEMIDE 20 MG PO TABS
20.0000 mg | ORAL_TABLET | ORAL | Status: DC
  Administered 2022-09-15: 20 mg via ORAL
  Filled 2022-09-14: qty 1

## 2022-09-14 MED ORDER — AMLODIPINE BESYLATE 5 MG PO TABS
2.5000 mg | ORAL_TABLET | Freq: Every day | ORAL | Status: DC
  Administered 2022-09-14 – 2022-09-16 (×3): 2.5 mg via ORAL
  Filled 2022-09-14 (×3): qty 1

## 2022-09-14 MED ORDER — METHOCARBAMOL 500 MG PO TABS
500.0000 mg | ORAL_TABLET | Freq: Once | ORAL | Status: AC
  Administered 2022-09-14: 500 mg via ORAL
  Filled 2022-09-14: qty 1

## 2022-09-14 MED ORDER — BUSPIRONE HCL 5 MG PO TABS
10.0000 mg | ORAL_TABLET | Freq: Three times a day (TID) | ORAL | Status: DC
  Administered 2022-09-14 – 2022-09-16 (×6): 10 mg via ORAL
  Filled 2022-09-14 (×6): qty 2

## 2022-09-14 MED ORDER — ENOXAPARIN SODIUM 40 MG/0.4ML IJ SOSY: 40.0000 mg | PREFILLED_SYRINGE | INTRAMUSCULAR | Status: DC

## 2022-09-14 MED ORDER — TRAMADOL HCL 50 MG PO TABS
50.0000 mg | ORAL_TABLET | Freq: Three times a day (TID) | ORAL | Status: DC | PRN
  Administered 2022-09-14 – 2022-09-16 (×4): 50 mg via ORAL
  Filled 2022-09-14 (×4): qty 1

## 2022-09-14 NOTE — Progress Notes (Addendum)
Patient arrived at the unit,CHG bath given,vitals checked,no complaints of pain, pt oriented to the unit,admission paged

## 2022-09-14 NOTE — Consult Note (Signed)
Hospital Consult    Reason for Consult: Concern for giant cell arteritis Requesting Physician: ED MRN #:  [id number]  History of Present Illness: This is a 82 y.o. adult who presented to the emergency department with new onset left sided temporal pain.  This originally manifested several days ago, but has continued to worsen.  Simple pain has an associated headache, pain to palpation, jaw pain.  She has glaucoma and states her vision has not changed.  Vascular surgery was called due to concerns for temporal arteritis in the setting of left temporal pain, elevated ESR.  , [name] [name] was doing well.  The pain improved significantly with the use of pain medication, however she continues to have left temporal tenderness.  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 s/p R external iliac stent '06 with angioplasty '13 (Dr. Benjamine Sprague) b. 01/2016: s/p PTA/stenting in L common iliac artery (Dr. Gwenlyn Found)   Pneumonia of right lower lobe due to infectious organism 11/11/2017   Polymyalgia rheumatica ([place])    Psoriasis    Sepsis due to pneumonia ([place]) 11/21/2020   Sepsis with acute hypoxic respiratory failure ([place]) 09/01/2020   Sleep apnea    Tremor 09/06/2019    Past Surgical History:  Procedure Laterality Date   ABDOMINAL HYSTERECTOMY  1963   Partial,  Due to bleeding after delivery   ANTERIOR CERVICAL DECOMP/DISCECTOMY FUSION  04/19/2012   Procedure: ANTERIOR CERVICAL  DECOMPRESSION/DISCECTOMY FUSION 2 LEVELS;  Surgeon: Winfield Cunas, MD;  Location: [place] [facility];  Service: Neurosurgery;  Laterality: N/A;  Cervical four-five,Cervical five-six anterior cervical decompression with fusion plating and bonegraft possible posterior cervical decompression   APPENDECTOMY     CARPAL TUNNEL RELEASE  1990   CERVICAL FUSION  04/19/2012   EYE SURGERY     ILIAC ARTERY STENT     PERIPHERAL VASCULAR CATHETERIZATION N/A 01/31/2016   Procedure: Abdominal Aortogram;  Surgeon: [name], MD;  Location: [facility];  Service: Cardiovascular;  Laterality: N/A;   PERIPHERAL VASCULAR CATHETERIZATION Bilateral 01/31/2016   Procedure: Lower Extremity Angiography;  Surgeon: [name], MD;  Location: [place];  Service: Cardiovascular;  Laterality: Bilateral;   PERIPHERAL VASCULAR CATHETERIZATION Left 01/31/2016   Procedure: Peripheral Vascular Intervention;  Surgeon: [name], MD;  Location: [facility];  Service: Cardiovascular;  Laterality: Left;  ILIAC   TEE WITHOUT CARDIOVERSION N/A 11/01/2020   Procedure: TRANSESOPHAGEAL ECHOCARDIOGRAM (TEE);  Surgeon: Sueanne Margarita, MD;  Location: [place];  Service: Cardiovascular;  Laterality: N/A;   TEE WITHOUT CARDIOVERSION N/A 11/25/2020   Procedure: TRANSESOPHAGEAL ECHOCARDIOGRAM (TEE);  Surgeon: Sueanne Margarita, MD;  Location: [facility];  Service: Cardiovascular;  Laterality: N/A;   TONSILLECTOMY      Allergies  Allergen Reactions   Statins    Gabapentin Itching    Was deleted since  patient was taking, but now experiencing itching again.    Statins Swelling, Rash and Other (See Comments)    Swelling involving tongue; also causes muscle pain    Prior to Admission medications   Medication Sig Start Date End Date Taking? Authorizing Provider  acetaminophen (TYLENOL) 500 MG tablet Take 500 mg by mouth every 8 (eight) hours as needed for moderate pain.   Yes [provider]   albuterol (VENTOLIN HFA) 108 (90 Base) MCG/ACT inhaler Inhale 1 puff into the lungs daily as needed for shortness of breath. 09/01/22  Yes [provider]  amLODipine (NORVASC) 2.5 MG tablet Take 1 tablet (2.5 mg total) by mouth daily. 11/05/20  Yes Antonieta Pert, MD  busPIRone (BUSPAR) 10 MG tablet Take 10 mg by mouth 3 (three) times daily. 04/08/22  Yes [provider]  clopidogrel (PLAVIX) 75 MG tablet Take 1 tablet (75 mg total) by mouth every morning. 02/18/18  Yes [name], MD  divalproex (DEPAKOTE) 125 MG DR tablet Take 250 mg by mouth 2 (two) times daily. 08/12/19  Yes [provider]  furosemide (LASIX) 20 MG tablet Take 20 mg by mouth every other day. 07/17/22  Yes [provider]  gabapentin (NEURONTIN) 100 MG capsule Take 200 mg by mouth 3 (three) times daily.   Yes [provider]  loperamide (IMODIUM) 2 MG capsule Take 1 capsule (2 mg total) by mouth as needed for diarrhea or loose stools. 07/26/22  Yes Ghimire, Henreitta Leber, MD  pantoprazole (PROTONIX) 40 MG tablet Take 40 mg by mouth daily. 08/21/19  Yes [provider]  traMADol (ULTRAM) 50 MG tablet Take 50 mg by mouth 3 (three) times daily as needed for moderate pain. 06/07/22  Yes [provider]  amoxicillin (AMOXIL) 500 MG capsule Take 500 mg by mouth 3 (three) times daily. Patient not taking: Reported on 09/14/2022 09/01/22   [provider]  HYDROcodone-acetaminophen (NORCO/VICODIN) 5-325 MG tablet Take 1 tablet by mouth every 6 (six) hours as needed for moderate pain or severe pain (to be given 30 mini prior to dresssing changes). Patient not taking: Reported on 09/14/2022 05/09/22   Debbe Odea, MD    Social History   Socioeconomic History   Marital status: Widowed    Spouse name: Not on file   Number of children: Not on file   Years of education: Not on file   Highest education level: Not on file  Occupational History   Not on file  Tobacco Use    Smoking status: Former    Packs/day: 1.00    Years: 40.00    Total pack years: 40.00    Types: Cigarettes    Quit date: 07/24/2010    Years since quitting: 12.1   Smokeless tobacco: Never   Tobacco comments:    STARTED BACK JULY 2013 AND RECENTLY QUIT 03-10-2012  Vaping Use   Vaping Use: Never used  Substance and Sexual Activity   Alcohol use: Never    Comment: QUIT IN 1999   Drug use: Never   Sexual activity: Not Currently    Comment: 1st intercourse 82 yo-Fewer than 5 partners  Other Topics Concern   Not on file  Social History Narrative   ** Merged History Encounter **       Social Determinants of Health   Financial Resource Strain: Not on file  Food Insecurity: Food Insecurity Present (05/02/2022)   Hunger Vital Sign    Worried About [place] in the Last Year:  Often true    Ran Out of Food in the Last Year: Often true  Transportation Needs: Unmet Transportation Needs (05/02/2022)   PRAPARE - Hydrologist (Medical): Yes    Lack of Transportation (Non-Medical): Yes  Physical Activity: Not on file  Stress: Stress Concern Present (03/22/2018)   [place]    Feeling of Stress : Very much  Social Connections: Not on file  Intimate Partner Violence: Not At Risk (05/02/2022)   Humiliation, Afraid, Rape, and Kick questionnaire    Fear of Current or Ex-Partner: No    Emotionally Abused: No    Physically Abused: No    Sexually Abused: No   Family History  Problem Relation Age of Onset   Angina Mother    Congestive Heart Failure Mother    Heart attack Father    Melanoma Brother    Congestive Heart Failure Brother    Heart disease Brother    Cancer Sister        Lymphoma   Neuropathy Sister    Brain cancer Sister    Osteoarthritis Daughter     ROS: Otherwise negative unless mentioned in HPI  Physical Examination  Vitals:   09/14/22 1742 09/14/22 1801  BP:   131/69  Pulse: 98   Resp:    Temp: 98.2 F (36.8 C)   SpO2: 96%    Body mass index is 27.73 kg/m.  General:  WDWN in NAD Gait: Not observed HENT: WNL, normocephalic Pulmonary: normal non-labored breathing, without Rales, rhonchi,  wheezing Cardiac: regular Abdomen: soft, NT/ND, no masses Skin: without rashes Vascular Exam/Pulses: 2+radial pulses Extremities: without ischemic changes, without Gangrene , without cellulitis; without open wounds;  Musculoskeletal: no muscle wasting or atrophy  Neurologic: A&O X 3;  No focal weakness or paresthesias are detected; speech is fluent/normal Psychiatric:  The pt has Normal affect. Lymph:  Unremarkable  CBC    Component Value Date/Time   WBC 5.3 09/14/2022 0634   RBC 3.71 (L) 09/14/2022 0634   HGB 12.8 09/14/2022 0634   HGB 12.0 06/03/2019 1047   HCT 37.3 09/14/2022 0634   HCT 34.7 06/03/2019 1047   PLT 140 (L) 09/14/2022 0634   PLT 167 06/03/2019 1047   MCV 100.5 (H) 09/14/2022 0634   MCV 100 (H) 06/03/2019 1047   MCH 34.5 (H) 09/14/2022 0634   MCHC 34.3 09/14/2022 0634   RDW 13.7 09/14/2022 0634   RDW 14.3 06/03/2019 1047   LYMPHSABS 1.4 09/14/2022 0634   LYMPHSABS 2.3 06/03/2019 1047   MONOABS 0.5 09/14/2022 0634   EOSABS 0.1 09/14/2022 0634   EOSABS 0.1 06/03/2019 1047   BASOSABS 0.0 09/14/2022 0634   BASOSABS 0.0 06/03/2019 1047    BMET    Component Value Date/Time   NA 136 09/14/2022 0634   NA 141 06/03/2019 1047   K 3.5 09/14/2022 0634   CL 102 09/14/2022 0634   CO2 24 09/14/2022 0634   GLUCOSE 101 (H) 09/14/2022 0634   BUN 13 09/14/2022 0634   BUN 15 06/03/2019 1047   CREATININE 0.83 09/14/2022 0634   CREATININE 1.07 (H) 08/01/2016 1512   CALCIUM 9.4 09/14/2022 0634   GFRNONAA >60 09/14/2022 0634   GFRNONAA 50 (L) 08/01/2016 1512   GFRAA >60 09/08/2019 0133   GFRAA 58 (L) 08/01/2016 1512    COAGS: Lab Results  Component Value Date   INR 1.2 07/22/2022   INR 1.1 11/21/2020   INR 1.1 10/24/2020  ASSESSMENT/PLAN: This is a 82 y.o. adult who presented to the emergency department with left temporal headache, jaw pain, temporal tenderness.  ESR was elevated.  Best surgeon was called due to concern for temporal arteritis.  Had a long conversation with [name] [name] and her daughter regarding temporal arteritis, specifically temporal artery biopsy which is the gold standard for diagnosis.  After discussing the risk and benefits of surgery, [name] [name] elected to proceed with a left-sided temporal artery biopsy.  Please make n.p.o. at midnight, surgery tomorrow.  Can hold Plavix.   Cassandria Santee MD [facility] [phone] 09/14/2022  6:30 PM

## 2022-09-14 NOTE — ED Provider Notes (Signed)
[facility] Provider Note   CSN: [id number] Arrival date & time: 09/14/22  [id number]     History  Chief Complaint  Patient presents with   Chest Pain    [name] [name] is a 82 y.o. adult with HTN, GERD, polymyalgia rheumatica, OSA, h/o septic embolism, PAD, CKD stage IIIb, polypharmacy, anxiety/depression presents with left-sided neck pain.   Patient reports from home with left-sided neck pain.  This started a few days ago and has worsened.  She states the pain starts in her left anterior neck and goes up the back of her head and causes a 10 out of 10 throbbing left-sided headache. The pain also goes down her left trapezius muscle behind her shoulder blade and she feels a tingling in her left arm and hand. The tingling is located in all of 5 of her fingers and is not numbness she also feels as though her grip strength is less.  There is a lot of pain in her neck when she tries to turn her head to the left or flex her neck and significant tenderness with movement of the arm or palpation of the area.  She has not fallen or had any trauma.  States that sometimes her left upper chest is hurting as well but that it is coming from her neck.  Patient reports that she had a cough for several weeks productive of dark green sputum and saw her PCP a week ago who diagnosed her with pneumonia in bilateral lower lobes and treated her with an antibiotic. Pt doesn't know which antibiotic it was but finished it yesterday. She reports she is still coughing but not as severely and the sputum has turned from dark green to clear. She is also not having fevers anymore.  She denies other neurologic symptoms including asymmetric weakness, trouble speaking or swallowing, numbness tingling anywhere else, urinary symptoms.  Did not have any significant twisting movements or chiropractic manipulation that started the pain.  BP 154/76, hr 100, cbg 106, spo2 99%  Per chart review, patient  was recently admitted from 07/22/22-07/26/22 for e coli bacteremia in s/o complicated UTI a/w acute metabolic encephalopathy and AKI.    Chest Pain      Home Medications Prior to Admission medications   Medication Sig Start Date End Date Taking? Authorizing Provider  acetaminophen (TYLENOL) 500 MG tablet Take 500 mg by mouth every 8 (eight) hours as needed for moderate pain.   Yes [provider]  albuterol (VENTOLIN HFA) 108 (90 Base) MCG/ACT inhaler Inhale 1 puff into the lungs daily as needed for shortness of breath. 09/01/22  Yes [provider]  amLODipine (NORVASC) 2.5 MG tablet Take 1 tablet (2.5 mg total) by mouth daily. 11/05/20  Yes Antonieta Pert, MD  busPIRone (BUSPAR) 10 MG tablet Take 10 mg by mouth 3 (three) times daily. 04/08/22  Yes [provider]  clopidogrel (PLAVIX) 75 MG tablet Take 1 tablet (75 mg total) by mouth every morning. 02/18/18  Yes [name], MD  divalproex (DEPAKOTE) 125 MG DR tablet Take 250 mg by mouth 2 (two) times daily. 08/12/19  Yes [provider]  furosemide (LASIX) 20 MG tablet Take 20 mg by mouth every other day. 07/17/22  Yes [provider]  gabapentin (NEURONTIN) 100 MG capsule Take 200 mg by mouth 3 (three) times daily.   Yes [provider]  loperamide (IMODIUM) 2 MG capsule Take 1 capsule (2 mg total) by mouth as needed  for diarrhea or loose stools. 07/26/22  Yes Ghimire, Henreitta Leber, MD  pantoprazole (PROTONIX) 40 MG tablet Take 40 mg by mouth daily. 08/21/19  Yes [provider]  traMADol (ULTRAM) 50 MG tablet Take 50 mg by mouth 3 (three) times daily as needed for moderate pain. 06/07/22  Yes [provider]  amoxicillin (AMOXIL) 500 MG capsule Take 500 mg by mouth 3 (three) times daily. Patient not taking: Reported on 09/14/2022 09/01/22   [provider]  HYDROcodone-acetaminophen (NORCO/VICODIN) 5-325 MG tablet Take 1 tablet by mouth every 6 (six) hours as needed for  moderate pain or severe pain (to be given 30 mini prior to dresssing changes). Patient not taking: Reported on 09/14/2022 05/09/22   Debbe Odea, MD      Allergies    Statins, Gabapentin, and Statins    Review of Systems   Review of Systems  Cardiovascular:  Positive for chest pain.   Review of systems Negative for f/c.  A 10 point review of systems was performed and is negative unless otherwise reported in HPI.  Physical Exam Updated Vital Signs BP (!) 148/62 (BP Location: Right Arm)   Pulse 77   Temp 98.2 F (36.8 C) (Oral)   Resp 16   Ht 5' (1.524 m)   Wt 64.4 kg   SpO2 93%   BMI 27.73 kg/m  Physical Exam General: Normal appearing female, lying in bed.  HEENT: PERRLA, EOMI, sclera anicteric, MMM, trachea midline. Carotid pulse intact bilaterally.  Cardiology: RRR, no murmurs/rubs/gallops. BL radial and DP pulses equal bilaterally.  No tenderness palpation of the chest wall. Resp: Normal respiratory rate and effort. CTAB, no wheezes, rhonchi, crackles.  Abd: Soft, non-tender, non-distended. No rebound tenderness or guarding.  GU: Deferred. MSK: Significant tenderness palpation in the left trapezius and left anterior lateral neck musculature.  No masses or abnormalities palpated.  No obvious signs of trauma.  Range of motion of the left shoulder is reduced due to pain.  No peripheral edema.  Intact sensation of the left upper extremity.  Extremities without deformity or TTP. No cyanosis or clubbing.  LUE compartments are soft. Skin: warm, dry. No rashes or lesions. Back: No midline C-spine tenderness palpation Neuro: A&Ox4, CNs II-XII grossly intact.  5 out of 5 strength all 4 extremities, including left grip strength. Sensation grossly intact.  Psych: Normal mood and affect.   ED Results / Procedures / Treatments   Labs (all labs ordered are listed, but only abnormal results are displayed) Labs Reviewed  CBC WITH DIFFERENTIAL/PLATELET - Abnormal; Notable for the following  components:      Result Value   RBC 3.71 (*)    MCV 100.5 (*)    MCH 34.5 (*)    Platelets 140 (*)    All other components within normal limits  COMPREHENSIVE METABOLIC PANEL - Abnormal; Notable for the following components:   Glucose, Bld 101 (*)    Total Protein 6.4 (*)    All other components within normal limits  SEDIMENTATION RATE - Abnormal; Notable for the following components:   Sed Rate 45 (*)    All other components within normal limits  C-REACTIVE PROTEIN - Abnormal; Notable for the following components:   CRP 1.2 (*)    All other components within normal limits  LIPASE, BLOOD  CBC  BASIC METABOLIC PANEL  TROPONIN I (HIGH SENSITIVITY)  TROPONIN I (HIGH SENSITIVITY)    EKG EKG Interpretation  Date/Time:  Thursday September 14 2022 06:36:39 EST Ventricular Rate:  96 PR Interval:  147 QRS Duration: 93 QT Interval:  368 QTC Calculation: 465 R Axis:   51 Text Interpretation: Sinus rhythm artifact v6 Confirmed by Ezequiel Essex [phone]) on 09/14/2022 6:39:53 AM  Radiology CT ANGIO HEAD NECK W WO CM  Result Date: 09/14/2022 CLINICAL DATA:  severe neck pain and headache, c/f arterial dissection EXAM: CT ANGIOGRAPHY HEAD AND NECK TECHNIQUE: Multidetector CT imaging of the head and neck was performed using the standard protocol during bolus administration of intravenous contrast. Multiplanar CT image reconstructions and MIPs were obtained to evaluate the vascular anatomy. Carotid stenosis measurements (when applicable) are obtained utilizing NASCET criteria, using the distal internal carotid diameter as the denominator. RADIATION DOSE REDUCTION: This exam was performed according to the departmental dose-optimization program which includes automated exposure control, adjustment of the mA and/or kV according to patient size and/or use of iterative reconstruction technique. CONTRAST:  50m OMNIPAQUE IOHEXOL 350 MG/ML SOLN COMPARISON:  CT Head 07/22/2022. FINDINGS: CT HEAD FINDINGS  Brain: No evidence of acute infarction, hemorrhage, hydrocephalus, extra-axial collection or mass lesion/mass effect. Vascular: See below. Skull: No acute fracture. Sinuses/Orbits: Clear sinuses.  No acute orbital findings. Other: No mastoid effusions. Review of the MIP images confirms the above findings CTA NECK FINDINGS Aortic arch: Great vessel origins are patent without significant stenosis. Right carotid system: Patent. Atherosclerosis at the carotid bifurcation without greater than 50% stenosis. No evidence of dissection. Left carotid system: Patent. Approximately 40% stenosis of the common carotid artery origin. Atherosclerosis at the carotid bifurcation and proximal ICA with approximately 40% stenosis of the proximal ICA. No evidence of dissection. Vertebral arteries: Left dominant. Severe stenosis of the bilateral vertebral artery origins. No evidence of dissection. Skeleton: ACDF. Other neck: No acute findings on limited assessment. 2.4 cm left thyroid nodule. Upper chest: Approximately 4 mm right upper lobe pulmonary nodule. Review of the MIP images confirms the above findings CTA HEAD FINDINGS Anterior circulation: Bilateral intracranial ICAs, MCAs and ACAs are patent without proximal hemodynamically significant stenosis. Posterior circulation: Bilateral intradural vertebral arteries, basilar artery and bilateral posterior cerebral arteries are patent without proximal hemodynamically significant stenosis. Venous sinuses: As permitted by contrast timing, patent. Review of the MIP images confirms the above findings IMPRESSION: 1. No emergent large vessel occlusion.  No evidence of dissection. 2. Severe stenosis of the bilateral vertebral artery origins. 3. Approximately 40% stenosis of the left common carotid artery origin and proximal left ICA. 4. Approximately 4 mm right upper lobe pulmonary nodule. No follow-up needed if patient is low-risk.This recommendation follows the consensus statement: Guidelines  for Management of Incidental Pulmonary Nodules Detected on CT Images: From the Fleischner Society 2017; Radiology 2017; 284:228-243. 5. Approximately 2.4 cm left thyroid nodule. Recommend thyroid [place](ref: J Am Coll Radiol. 2015 Feb;12(2): 143-50). Electronically Signed   By: FMargaretha SheffieldM.D.   On: 09/14/2022 09:24   DG Chest 2 View  Result Date: 09/14/2022 CLINICAL DATA:  82year old female with history of chest pain and left arm pain. EXAM: CHEST - 2 VIEW COMPARISON:  Chest x-ray 07/22/2022. FINDINGS: Lung volumes are normal. No consolidative airspace disease. No pleural effusions. No pneumothorax. No pulmonary nodule or mass noted. Pulmonary vasculature and the cardiomediastinal silhouette are within normal limits. Atherosclerosis in the thoracic aorta. Orthopedic fixation hardware in the lower cervical spine incidentally noted. IMPRESSION: 1.  No radiographic evidence of acute cardiopulmonary disease. 2. Aortic atherosclerosis. Electronically Signed   By: DVinnie LangtonM.D.   On: 09/14/2022 06:55    Procedures  Procedures    Medications Ordered in ED Medications  lactated ringers bolus 500 mL (0 mLs Intravenous Stopped 09/14/22 0922)  acetaminophen (TYLENOL) tablet 1,000 mg (1,000 mg Oral Given 09/14/22 0821)  metoCLOPramide (REGLAN) injection 10 mg (10 mg Intravenous Given 09/14/22 0824)  methocarbamol (ROBAXIN) tablet 500 mg (500 mg Oral Given 09/14/22 0821)  magnesium sulfate IVPB 2 g 50 mL (0 g Intravenous Stopped 09/14/22 0932)  iohexol (OMNIPAQUE) 350 MG/ML injection 75 mL (75 mLs Intravenous Contrast Given 09/14/22 0848)    ED Course/ Medical Decision Making/ A&P                          Medical Decision Making Amount and/or Complexity of Data Reviewed Labs: ordered. Decision-making details documented in ED Course. Radiology: ordered. Decision-making details documented in ED Course.  Risk OTC drugs. Prescription drug management. Decision regarding hospitalization.    This  patient presents to the ED for concern of left-sided neck pain, headache, arm tingling; this involves an extensive number of treatment options, and is a complaint that carries with it a high risk of complications and morbidity.  I considered the following differential and admission for this acute, potentially life threatening condition.   MDM:    Patient with left-sided neck pain radiating up causing a 10 out of 10 headache as well as tingling of the left arm/hand.  Consider muscle spasm of the left trapezius or left neck musculature given her significant tenderness palpation and difficulty with rotating her head and lifting her shoulder, this could be causing possibly a peripheral neuropathy, however she states all 5 of her fingers are tingly that on exam does not he did appear to follow a specific dermatomal or nerve distribution. Given neck pain/headache/arm tingling must consider carotid artery or vertebral artery dissection, intracranial artery dissection or abnormality. Also consider other causes of headache such as ICH, hydrocephalus. Patient does also have diagnosis of polymyalgia rheumatica which is associated with arterial dissections or giant cell arteritis. Patient does endorse tenderness over the left temple raising concern. ESR elevated to 45, CRP send out. Consider possible referred pain from ACS or arrhythmia, patient's EKG without any ischemic signs or arrhythmia, telemetry normal sinus rhythm heart rate in the 90s.  Consider possible worsening pneumonia or failure of outpatient treatment the patient says her sputum has improved as well as her fevers have gone away.  The antibiotic finished last night.  Clinical Course as of 09/15/22 0034  Thu Sep 14, 2022  0705 WBC: 5.3 No leukocytosis [HN]  0705 DG Chest 2 View 1.  No radiographic evidence of acute cardiopulmonary disease. 2. Aortic atherosclerosis.   [HN]  0740 Troponin I (High Sensitivity): 4 [HN]  0740 Lipase: 38 [HN]  0741  Comprehensive metabolic panel(!) unremarkable [HN]  0917 Troponin I (High Sensitivity): 5 Flat [HN]  0943 CT ANGIO HEAD NECK W WO CM 1. No emergent large vessel occlusion.  No evidence of dissection. 2. Severe stenosis of the bilateral vertebral artery origins. 3. Approximately 40% stenosis of the left common carotid artery origin and proximal left ICA. 4. Approximately 4 mm right upper lobe pulmonary nodule. No follow-up needed if patient is low-risk.This recommendation follows the consensus statement: Guidelines for Management of Incidental Pulmonary Nodules Detected on CT Images: From the Fleischner Society 2017; Radiology 2017; 284:228-243. 5. Approximately 2.4 cm left thyroid nodule. Recommend thyroid US (ref: J Am Coll Radiol. 2015 Feb;12(2): 143-50).   [HN]  1027 Sed Rate(!): 45 Elevated  but not >50 [HN]  1120 Patient with much improved headache/neck pain but persistent TTP of the left temple. D/t overarching concern for GCA, will consult to neurology.  [HN]  [id number] Will consult to vascular surgery for temporal artery biopsy and hospitalist for admission. Will hold off on steroids at this time to not interfere w/ results of biopsy. [HN]    Clinical Course User Index [HN] Audley Hose, MD    Labs: Ordered from triage, and I personally interpreted labs.  The pertinent results include: Those listed above  Imaging Studies ordered: I ordered imaging studies including CTA head and neck with and without contrast I independently visualized and interpreted imaging. I agree with the radiologist interpretation  Additional history obtained from chart review.  Cardiac Monitoring: The patient was maintained on a cardiac monitor.  I personally viewed and interpreted the cardiac monitored which showed an underlying rhythm of: Normal sinus rhythm  Reevaluation: After the interventions noted above, I reevaluated the patient and found that they have :improved  Social Determinants of  Health: Patient lives independently   Disposition:  Admitted to hospitalist, vascular surgery will biopsy tomorrow  Co morbidities that complicate the patient evaluation  Past Medical History:  Diagnosis Date   Anxiety    Arthritis    RA   Carpal tunnel syndrome, bilateral    Chronic kidney disease    CKD stage III, absence of left kidney   CKD (chronic kidney disease) stage 3, GFR 30-59 ml/min ([place]) 09/06/2019   Congenital absence of one kidney    Pt has right kidney only   Depression    Dyslipidemia 09/06/2019   Essential hypertension 09/06/2019   GERD (gastroesophageal reflux disease)    Headache    Heart murmur    Hypercholesteremia    Hypertension    MRSA bacteremia 10/29/2020   Peripheral vascular disease ([place])    a. s/p R external iliac stent '06 with angioplasty '13 (Dr. Benjamine Sprague) b. 01/2016: s/p PTA/stenting in L common iliac artery (Dr. Gwenlyn Found)   Pneumonia of right lower lobe due to infectious organism 11/11/2017   Polymyalgia rheumatica ([place])    Psoriasis    Sepsis due to pneumonia ([place]) 11/21/2020   Sepsis with acute hypoxic respiratory failure ([place]) 09/01/2020   Sleep apnea    Tremor 09/06/2019     Medicines Meds ordered this encounter  Medications   lactated ringers bolus 500 mL   acetaminophen (TYLENOL) tablet 1,000 mg   metoCLOPramide (REGLAN) injection 10 mg   methocarbamol (ROBAXIN) tablet 500 mg   magnesium sulfate IVPB 2 g 50 mL   iohexol (OMNIPAQUE) 350 MG/ML injection 75 mL   sodium chloride (PF) 0.9 % injection    Antonieta Iba A: cabinet override   DISCONTD: enoxaparin (LOVENOX) injection 40 mg   OR Linked Order Group    acetaminophen (TYLENOL) tablet 650 mg    acetaminophen (TYLENOL) suppository 650 mg   OR Linked Order Group    ondansetron (ZOFRAN) tablet 4 mg    ondansetron (ZOFRAN) injection 4 mg   amLODipine (NORVASC) tablet 2.5 mg   albuterol (PROVENTIL) (2.5 MG/3ML) 0.083% nebulizer solution 2.5 mg   busPIRone (BUSPAR) tablet 10 mg    DISCONTD: clopidogrel (PLAVIX) tablet 75 mg   divalproex (DEPAKOTE) DR tablet 250 mg   furosemide (LASIX) tablet 20 mg   gabapentin (NEURONTIN) capsule 200 mg   pantoprazole (PROTONIX) EC tablet 40 mg   traMADol (ULTRAM) tablet 50 mg   ketorolac (TORADOL) 15 MG/ML injection 15 mg  fentaNYL (SUBLIMAZE) injection 25 mcg    I have reviewed the patients home medicines and have made adjustments as needed  Problem List / ED Course: Problem List Items Addressed This Visit   None Visit Diagnoses     Neck pain on left side    -  Primary   Left-sided headache       Relevant Medications   acetaminophen (TYLENOL) tablet 1,000 mg (Completed)   methocarbamol (ROBAXIN) tablet 500 mg (Completed)   acetaminophen (TYLENOL) tablet 650 mg   acetaminophen (TYLENOL) suppository 650 mg   amLODipine (NORVASC) tablet 2.5 mg   divalproex (DEPAKOTE) DR tablet 250 mg   gabapentin (NEURONTIN) capsule 200 mg   traMADol (ULTRAM) tablet 50 mg   ketorolac (TORADOL) 15 MG/ML injection 15 mg (Completed)   fentaNYL (SUBLIMAZE) injection 25 mcg (Completed)                   This note was created using dictation software, which may contain spelling or grammatical errors.    Audley Hose, MD 09/15/22 [phone]

## 2022-09-14 NOTE — ED Notes (Signed)
Called to give report, asked to call back shortly.

## 2022-09-14 NOTE — H&P (Signed)
History and Physical    Patient: [name] [name] [id number] DOB: 19-Jul-1940 DOA: 09/14/2022 DOS: the patient was seen and examined on 09/14/2022 PCP: Emelia Loron, NP  Patient coming from: Home  Chief Complaint:  Chief Complaint  Patient presents with   Chest Pain   HPI: [name] [name] is a 82 y.o. adult with medical history significant of anxiety, depression,  bilateral carpal tunnel syndrome, stage IIIb CKD, congenital absence of the left kidney, hyperlipidemia, hypertension, GERD, MRSA bacteremia, peripheral vascular disease history of pneumonia, sleep apnea, tremor, rheumatoid arthritis, polymyalgia rheumatica, history of headaches who is coming due to left-sided chest/head pain radiating to her left upper extremity with tingling sensation and also producing a 10/10 throbbing left-sided headache. She denied fever, chills, rhinorrhea, sore throat, wheezing or hemoptysis.  She is still having some post pneumonia cough, but not as frequent as before.  She has occasional sputum production.  No chest pain, palpitations, diaphoresis, PND, orthopnea or pitting edema of the lower extremities.  No abdominal pain, nausea, emesis, diarrhea, constipation, melena or hematochezia.  No flank pain, dysuria, frequency or hematuria.  No polyuria, polydipsia, polyphagia or blurred vision.   ED course: Initial vital signs were temperature 97.7 F, pulse 93, respiration 18, BP 151/75 mmHg O2 sat 95% on room air.  The patient received LR 500 mL bolus, methocarbamol 500 mg p.o., acetaminophen 1000 mg p.o., magnesium sulfate 2 g IVPB x 1 and metoclopramide 10 mg IVP.  Lab work: Her CBC showed a white count 5.3, hemoglobin 12.8 g/deciliter platelets 140.  ESR was 45 mm/h.  Troponin x 2 normal.  Lipase was 38.  CMP showed a glucose of 101 mg/dL and total protein of 6.4 g/dL.  The rest of the CMP measurements were normal.  Imaging: 2 view chest radiograph with no evidence of acute cardiopulmonary disease.  There was aortic  atherosclerosis.  CTA head and neck with no emergent large vessel occlusion or evidence of dissection.  There is severe stenosis of the bilateral vertebral artery origins.  There is 40% stenosis of the left common carotid artery origin and the proximal left ICA.  There is a 4 mm right upper lobe pulmonary nodule.  No follow-up needed if patient is low risk.   Review of Systems: As mentioned in the history of present illness. All other systems reviewed and are negative.  Past Medical History:  Diagnosis Date   Anxiety    Arthritis    RA   Carpal tunnel syndrome, bilateral    Chronic kidney disease    CKD stage III, absence of left kidney   CKD (chronic kidney disease) stage 3, GFR 30-59 ml/min ([place]) 09/06/2019   Congenital absence of one kidney    Pt has right kidney only   Depression    Dyslipidemia 09/06/2019   Essential hypertension 09/06/2019   GERD (gastroesophageal reflux disease)    Headache    Heart murmur    Hypercholesteremia    Hypertension    MRSA bacteremia 10/29/2020   Peripheral vascular disease ([place])    a. s/p R external iliac stent '06 with angioplasty '13 (Dr. Benjamine Sprague) b. 01/2016: s/p PTA/stenting in L common iliac artery (Dr. Gwenlyn Found)   Pneumonia of right lower lobe due to infectious organism 11/11/2017   Polymyalgia rheumatica ([place])    Psoriasis    Sepsis due to pneumonia ([place]) 11/21/2020   Sepsis with acute hypoxic respiratory failure ([place]) 09/01/2020   Sleep apnea    Tremor 09/06/2019   Past Surgical History:  Procedure Laterality Date   ABDOMINAL HYSTERECTOMY  1963   Partial,  Due to bleeding after delivery   ANTERIOR CERVICAL DECOMP/DISCECTOMY FUSION  04/19/2012   Procedure: ANTERIOR CERVICAL DECOMPRESSION/DISCECTOMY FUSION 2 LEVELS;  Surgeon: Winfield Cunas, MD;  Location: [facility];  Service: Neurosurgery;  Laterality: N/A;  Cervical four-five,Cervical five-six anterior cervical decompression with fusion plating and bonegraft possible posterior cervical  decompression   APPENDECTOMY     CARPAL TUNNEL RELEASE  1990   CERVICAL FUSION  04/19/2012   EYE SURGERY     ILIAC ARTERY STENT     PERIPHERAL VASCULAR CATHETERIZATION N/A 01/31/2016   Procedure: Abdominal Aortogram;  Surgeon: [name], MD;  Location: [facility];  Service: Cardiovascular;  Laterality: N/A;   PERIPHERAL VASCULAR CATHETERIZATION Bilateral 01/31/2016   Procedure: Lower Extremity Angiography;  Surgeon: [name], MD;  Location: [facility];  Service: Cardiovascular;  Laterality: Bilateral;   PERIPHERAL VASCULAR CATHETERIZATION Left 01/31/2016   Procedure: Peripheral Vascular Intervention;  Surgeon: [name], MD;  Location: [facility];  Service: Cardiovascular;  Laterality: Left;  ILIAC   TEE WITHOUT CARDIOVERSION N/A 11/01/2020   Procedure: TRANSESOPHAGEAL ECHOCARDIOGRAM (TEE);  Surgeon: Sueanne Margarita, MD;  Location: [facility];  Service: Cardiovascular;  Laterality: N/A;   TEE WITHOUT CARDIOVERSION N/A 11/25/2020   Procedure: TRANSESOPHAGEAL ECHOCARDIOGRAM (TEE);  Surgeon: Sueanne Margarita, MD;  [facility];  Service: Cardiovascular;  Laterality: N/A;   TONSILLECTOMY     Social History:  reports that she quit smoking about 12 years ago. Her smoking use included cigarettes. She has a 40.00 pack-year smoking history. She has never used smokeless tobacco. She reports that she does not drink alcohol and does not use drugs.  Allergies  Allergen Reactions   Statins    Gabapentin Itching    Was deleted since patient was taking, but now experiencing itching again.    Statins Swelling, Rash and Other (See Comments)    Swelling involving tongue; also causes muscle pain    Family History  Problem Relation Age of Onset   Angina Mother    Congestive Heart Failure Mother    Heart attack Father    Melanoma Brother    Congestive Heart Failure Brother    Heart disease Brother    Cancer Sister        Lymphoma   Neuropathy  Sister    Brain cancer Sister    Osteoarthritis Daughter     Prior to Admission medications   Medication Sig Start Date End Date Taking? Authorizing Provider  acetaminophen (TYLENOL) 500 MG tablet Take 500 mg by mouth every 8 (eight) hours as needed for moderate pain.    [provider]  amLODipine (NORVASC) 2.5 MG tablet Take 1 tablet (2.5 mg total) by mouth daily. 11/05/20   Antonieta Pert, MD  busPIRone (BUSPAR) 10 MG tablet Take 10 mg by mouth 3 (three) times daily. 04/08/22   [provider]  clopidogrel (PLAVIX) 75 MG tablet Take 1 tablet (75 mg total) by mouth every morning. 02/18/18   [name], MD  divalproex (DEPAKOTE) 125 MG DR tablet Take 250 mg by mouth 2 (two) times daily. 08/12/19   [provider]  gabapentin (NEURONTIN) 100 MG capsule Take 200 mg by mouth 3 (three) times daily.    [provider]  HYDROcodone-acetaminophen (NORCO/VICODIN) 5-325 MG tablet Take 1 tablet by mouth every 6 (six) hours as needed for moderate pain or severe pain (to  be given 30 mini prior to dresssing changes). 05/09/22   Debbe Odea, MD  loperamide (IMODIUM) 2 MG capsule Take 1 capsule (2 mg total) by mouth as needed for diarrhea or loose stools. 07/26/22   Ghimire, Henreitta Leber, MD  pantoprazole (PROTONIX) 40 MG tablet Take 40 mg by mouth daily. 08/21/19   [provider]  traMADol (ULTRAM) 50 MG tablet Take 50 mg by mouth 3 (three) times daily as needed for moderate pain. 06/07/22   [provider]    Physical Exam: Vitals:   09/14/22 0630 09/14/22 0635 09/14/22 0937 09/14/22 1019  BP: (!) 151/75 127/87 (!) 143/79   Pulse: 93 94 77   Resp: '18 13 19   '$ Temp: 97.7 F (36.5 C) 97.7 F (36.5 C)  98 F (36.7 C)  TempSrc: Oral Oral  Oral  SpO2: 95% 94% 94%   Weight:      Height:       Physical Exam Vitals and nursing note reviewed.  Constitutional:      Appearance: She is well-developed. She is ill-appearing.  HENT:     Head: Normocephalic.      Nose: No rhinorrhea.     Mouth/Throat:     Mouth: Mucous membranes are moist.  Eyes:     General: No scleral icterus.    Pupils: Pupils are equal, round, and reactive to light.  Neck:     Vascular: No JVD.  Cardiovascular:     Rate and Rhythm: Normal rate and regular rhythm.  Pulmonary:     Effort: Pulmonary effort is normal.     Breath sounds: No wheezing, rhonchi or rales.  Abdominal:     General: Bowel sounds are normal. There is no distension.     Palpations: Abdomen is soft.     Tenderness: There is no abdominal tenderness.  Musculoskeletal:     Cervical back: Neck supple.     Right lower leg: No edema.     Left lower leg: No edema.  Skin:    General: Skin is warm and dry.  Neurological:     General: No focal deficit present.     Mental Status: She is alert and oriented to person, place, and time.  Psychiatric:        Mood and Affect: Mood normal.        Behavior: Behavior normal.     Data Reviewed:  Results are pending, will review when available.  Assessment and Plan: Principal Problem:   Headache Left temporal in nature. Observation/telemetry. Analgesics as needed. Antiemetics as needed. Vascular surgery consult appreciated. N.p.o. after midnight. Will undergo temporal biopsy tomorrow.  Active Problems:   Essential hypertension Continue amlodipine 2.5 mg p.o. daily. Continue furosemide 20 mg p.o. every other day. Monitor BP, HR, renal function and electrolytes.    GERD (gastroesophageal reflux disease) Pantoprazole 40 mg p.o. daily.    Glaucoma Continue regular drops.    OSA (obstructive sleep apnea) CPAP at bedtime.    Thrombocytopenia (HCC) Monitor platelet count.     Advance Care Planning:   Code Status: DNR   Consults: Vascular surgery (Dr. Virl Cagey).  Family Communication:   Severity of Illness: The appropriate patient status for this patient is INPATIENT. Inpatient status is judged to be reasonable and necessary in order to  provide the required intensity of service to ensure the patient's safety. The patient's presenting symptoms, physical exam findings, and initial radiographic and laboratory data in the context of their chronic comorbidities is felt to place them  at high risk for further clinical deterioration. Furthermore, it is not anticipated that the patient will be medically stable for discharge from the hospital within 2 midnights of admission.   * I certify that at the point of admission it is my clinical judgment that the patient will require inpatient hospital care spanning beyond 2 midnights from the point of admission due to high intensity of service, high risk for further deterioration and high frequency of surveillance required.*  Author: Reubin Milan, MD 09/14/2022 11:49 AM  For on call review [email or web address].   This document was prepared using Dragon voice recognition software and may contain some unintended transcription errors.

## 2022-09-14 NOTE — ED Triage Notes (Addendum)
From home with generalized pain. Chest wall pain left arm pain. No SOB. Pt has had cough for 2 weeks and has PNA. Pain started last night. Bp 154/76, hr 100, cbg 106, spo2 99%. Pt is on abx

## 2022-09-15 ENCOUNTER — Encounter (HOSPITAL_COMMUNITY)
Admission: EM | Disposition: A | Discharge status: Home / Self Care | Payer: Self-pay | Source: Home / Self Care | Attending: Emergency Medicine

## 2022-09-15 ENCOUNTER — Observation Stay (HOSPITAL_BASED_OUTPATIENT_CLINIC_OR_DEPARTMENT_OTHER): Payer: HMO | Admitting: Physician Assistant

## 2022-09-15 ENCOUNTER — Other Ambulatory Visit: Payer: Self-pay

## 2022-09-15 ENCOUNTER — Observation Stay (HOSPITAL_COMMUNITY): Payer: HMO | Admitting: Physician Assistant

## 2022-09-15 DIAGNOSIS — N189 Chronic kidney disease, unspecified: Secondary | ICD-10-CM

## 2022-09-15 DIAGNOSIS — Z87891 Personal history of nicotine dependence: Secondary | ICD-10-CM

## 2022-09-15 DIAGNOSIS — R700 Elevated erythrocyte sedimentation rate: Secondary | ICD-10-CM | POA: Diagnosis not present

## 2022-09-15 DIAGNOSIS — I129 Hypertensive chronic kidney disease with stage 1 through stage 4 chronic kidney disease, or unspecified chronic kidney disease: Secondary | ICD-10-CM | POA: Diagnosis not present

## 2022-09-15 DIAGNOSIS — I2510 Atherosclerotic heart disease of native coronary artery without angina pectoris: Secondary | ICD-10-CM | POA: Diagnosis not present

## 2022-09-15 DIAGNOSIS — R519 Headache, unspecified: Secondary | ICD-10-CM | POA: Diagnosis not present

## 2022-09-15 HISTORY — PX: ARTERY BIOPSY: SHX891

## 2022-09-15 LAB — BASIC METABOLIC PANEL
Anion gap: 10 (ref 5–15)
BUN: 11 mg/dL (ref 8–23)
CO2: 24 mmol/L (ref 22–32)
Calcium: 9 mg/dL (ref 8.9–10.3)
Chloride: 103 mmol/L (ref 98–111)
Creatinine, Ser: 0.92 mg/dL (ref 0.44–1.00)
GFR, Estimated: >60 mL/min (ref >60)
Glucose, Bld: 86 mg/dL (ref 70–99)
Potassium: 3.1 mmol/L — ABNORMAL LOW (ref 3.5–5.1)
Sodium: 137 mmol/L (ref 135–145)

## 2022-09-15 LAB — CBC
HCT: 32.3 % — ABNORMAL LOW (ref 36.0–46.0)
Hemoglobin: 11.6 g/dL — ABNORMAL LOW (ref 12.0–15.0)
MCH: 35.3 pg — ABNORMAL HIGH (ref 26.0–34.0)
MCHC: 35.9 g/dL (ref 30.0–36.0)
MCV: 98.2 fL (ref 80.0–100.0)
Platelets: 119 10*3/uL — ABNORMAL LOW (ref 150–400)
RBC: 3.29 MIL/uL — ABNORMAL LOW (ref 3.87–5.11)
RDW: 13.3 % (ref 11.5–15.5)
WBC: 4.6 10*3/uL (ref 4.0–10.5)
nRBC: 0 % (ref 0.0–0.2)

## 2022-09-15 LAB — MRSA NEXT GEN BY PCR, NASAL: MRSA by PCR Next Gen: DETECTED — AB

## 2022-09-15 SURGERY — BIOPSY TEMPORAL ARTERY
Anesthesia: Monitor Anesthesia Care | Site: Face | Laterality: Left

## 2022-09-15 MED ORDER — FENTANYL CITRATE (PF) 100 MCG/2ML IJ SOLN
INTRAMUSCULAR | Status: AC
  Filled 2022-09-15: qty 2

## 2022-09-15 MED ORDER — FENTANYL CITRATE (PF) 100 MCG/2ML IJ SOLN: 25.0000 ug | INTRAMUSCULAR | Status: DC | PRN

## 2022-09-15 MED ORDER — POTASSIUM CHLORIDE 10 MEQ/100ML IV SOLN
10.0000 meq | INTRAVENOUS | Status: AC
  Administered 2022-09-15 (×4): 10 meq via INTRAVENOUS
  Filled 2022-09-15 (×4): qty 100

## 2022-09-15 MED ORDER — LIDOCAINE HCL (PF) 1 % IJ SOLN
INTRAMUSCULAR | Status: AC
  Filled 2022-09-15: qty 30

## 2022-09-15 MED ORDER — LIDOCAINE HCL (PF) 1 % IJ SOLN
INTRAMUSCULAR | Status: DC | PRN
  Administered 2022-09-15: 1 mL

## 2022-09-15 MED ORDER — PHENYLEPHRINE 80 MCG/ML (10ML) SYRINGE FOR IV PUSH (FOR BLOOD PRESSURE SUPPORT)
PREFILLED_SYRINGE | INTRAVENOUS | Status: DC | PRN
  Administered 2022-09-15: 80 ug via INTRAVENOUS

## 2022-09-15 MED ORDER — CEFAZOLIN SODIUM-DEXTROSE 2-4 GM/100ML-% IV SOLN
2.0000 g | Freq: Once | INTRAVENOUS | Status: AC
  Administered 2022-09-15: 2 g via INTRAVENOUS
  Filled 2022-09-15: qty 100

## 2022-09-15 MED ORDER — LIDOCAINE 2% (20 MG/ML) 5 ML SYRINGE
INTRAMUSCULAR | Status: AC
  Filled 2022-09-15: qty 20

## 2022-09-15 MED ORDER — LACTATED RINGERS IV SOLN: INTRAVENOUS | Status: DC

## 2022-09-15 MED ORDER — CHLORHEXIDINE GLUCONATE CLOTH 2 % EX PADS
6.0000 | MEDICATED_PAD | Freq: Every day | CUTANEOUS | Status: DC
  Administered 2022-09-15 – 2022-09-16 (×2): 6 via TOPICAL

## 2022-09-15 MED ORDER — PREDNISONE 50 MG PO TABS
50.0000 mg | ORAL_TABLET | Freq: Every day | ORAL | Status: DC
  Administered 2022-09-15 – 2022-09-16 (×2): 50 mg via ORAL
  Filled 2022-09-15 (×2): qty 1

## 2022-09-15 MED ORDER — OXYCODONE HCL 5 MG/5ML PO SOLN: 5.0000 mg | Freq: Once | ORAL | Status: DC | PRN

## 2022-09-15 MED ORDER — DEXAMETHASONE SODIUM PHOSPHATE 10 MG/ML IJ SOLN
INTRAMUSCULAR | Status: AC
  Filled 2022-09-15: qty 1

## 2022-09-15 MED ORDER — FENTANYL CITRATE (PF) 100 MCG/2ML IJ SOLN
25.0000 ug | INTRAMUSCULAR | Status: DC | PRN
  Administered 2022-09-15: 50 ug via INTRAVENOUS

## 2022-09-15 MED ORDER — 0.9 % SODIUM CHLORIDE (POUR BTL) OPTIME
TOPICAL | Status: DC | PRN
  Administered 2022-09-15: 1000 mL

## 2022-09-15 MED ORDER — FENTANYL CITRATE (PF) 250 MCG/5ML IJ SOLN
INTRAMUSCULAR | Status: AC
  Filled 2022-09-15: qty 5

## 2022-09-15 MED ORDER — PHENYLEPHRINE HCL-NACL 20-0.9 MG/250ML-% IV SOLN
INTRAVENOUS | Status: DC | PRN
  Administered 2022-09-15: 40 ug/min via INTRAVENOUS

## 2022-09-15 MED ORDER — MUPIROCIN 2 % EX OINT
1.0000 | TOPICAL_OINTMENT | Freq: Two times a day (BID) | CUTANEOUS | Status: DC
  Administered 2022-09-15 – 2022-09-16 (×3): 1 via NASAL
  Filled 2022-09-15: qty 22

## 2022-09-15 MED ORDER — ORAL CARE MOUTH RINSE: 15.0000 mL | Freq: Once | OROMUCOSAL | Status: AC

## 2022-09-15 MED ORDER — PROPOFOL 500 MG/50ML IV EMUL
INTRAVENOUS | Status: DC | PRN
  Administered 2022-09-15: 100 ug/kg/min via INTRAVENOUS
  Administered 2022-09-15: 20 mg via INTRAVENOUS

## 2022-09-15 MED ORDER — ONDANSETRON HCL 4 MG/2ML IJ SOLN: 4.0000 mg | Freq: Once | INTRAMUSCULAR | Status: DC | PRN

## 2022-09-15 MED ORDER — ONDANSETRON HCL 4 MG/2ML IJ SOLN
INTRAMUSCULAR | Status: AC
  Filled 2022-09-15: qty 2

## 2022-09-15 MED ORDER — PROPOFOL 10 MG/ML IV BOLUS
INTRAVENOUS | Status: AC
  Filled 2022-09-15: qty 20

## 2022-09-15 MED ORDER — OXYCODONE HCL 5 MG PO TABS: 5.0000 mg | ORAL_TABLET | Freq: Once | ORAL | Status: DC | PRN

## 2022-09-15 MED ORDER — CHLORHEXIDINE GLUCONATE 0.12 % MT SOLN
15.0000 mL | Freq: Once | OROMUCOSAL | Status: AC
  Administered 2022-09-15: 15 mL via OROMUCOSAL

## 2022-09-15 SURGICAL SUPPLY — 40 items
BAG COUNTER SPONGE SURGICOUNT (BAG) ×1 IMPLANT
CANISTER SUCT 3000ML PPV (MISCELLANEOUS) ×1 IMPLANT
CLIP TI WIDE RED SMALL 6 (CLIP) ×1 IMPLANT
CNTNR URN SCR LID CUP LEK RST (MISCELLANEOUS) ×1 IMPLANT
CONT SPEC 4OZ STRL OR WHT (MISCELLANEOUS) ×1
COTTONBALL LRG STERILE PKG (GAUZE/BANDAGES/DRESSINGS) ×1 IMPLANT
COVER SURGICAL LIGHT HANDLE (MISCELLANEOUS) ×1 IMPLANT
DERMABOND ADVANCED .7 DNX12 (GAUZE/BANDAGES/DRESSINGS) ×1 IMPLANT
DRAPE BRACHIAL (DRAPES) ×1 IMPLANT
DRAPE HALF SHEET 40X57 (DRAPES) ×1 IMPLANT
DRAPE SURG 17X23 STRL (DRAPES) ×2 IMPLANT
ELECT REM PT RETURN 9FT ADLT (ELECTROSURGICAL) ×1
ELECTRODE REM PT RTRN 9FT ADLT (ELECTROSURGICAL) ×1 IMPLANT
GLOVE SURG SS PI 7.5 STRL IVOR (GLOVE) ×3 IMPLANT
GOWN STRL REUS W/ TWL LRG LVL3 (GOWN DISPOSABLE) ×1 IMPLANT
GOWN STRL REUS W/ TWL XL LVL3 (GOWN DISPOSABLE) ×1 IMPLANT
GOWN STRL REUS W/TWL LRG LVL3 (GOWN DISPOSABLE) ×1
GOWN STRL REUS W/TWL XL LVL3 (GOWN DISPOSABLE) ×1
KIT BASIN OR (CUSTOM PROCEDURE TRAY) ×1 IMPLANT
KIT TURNOVER KIT B (KITS) ×1 IMPLANT
LOOP VASCULAR MINI 18 RED (MISCELLANEOUS) ×1
NDL HYPO 25GX1X1/2 BEV (NEEDLE) ×1 IMPLANT
NEEDLE HYPO 25GX1X1/2 BEV (NEEDLE) ×1 IMPLANT
NS IRRIG 1000ML POUR BTL (IV SOLUTION) ×1 IMPLANT
PACK GENERAL/GYN (CUSTOM PROCEDURE TRAY) ×1 IMPLANT
PAD ARMBOARD 7.5X6 YLW CONV (MISCELLANEOUS) ×2 IMPLANT
SPIKE FLUID TRANSFER (MISCELLANEOUS) ×1 IMPLANT
SPONGE T-LAP 4X18 ~~LOC~~+RFID (SPONGE) ×1 IMPLANT
SUCTION FRAZIER HANDLE 10FR (MISCELLANEOUS) ×1
SUCTION TUBE FRAZIER 10FR DISP (MISCELLANEOUS) ×1 IMPLANT
SUT PROLENE 6 0 BV (SUTURE) IMPLANT
SUT SILK 3 0 (SUTURE)
SUT SILK 3-0 18XBRD TIE 12 (SUTURE) ×1 IMPLANT
SUT VIC AB 3-0 SH 27 (SUTURE) ×1
SUT VIC AB 3-0 SH 27X BRD (SUTURE) ×1 IMPLANT
SUT VICRYL 4-0 PS2 18IN ABS (SUTURE) ×1 IMPLANT
SYR CONTROL 10ML LL (SYRINGE) ×1 IMPLANT
TOWEL GREEN STERILE (TOWEL DISPOSABLE) ×1 IMPLANT
VASCULAR TIE MINI RED 18IN STL (MISCELLANEOUS) ×1 IMPLANT
WATER STERILE IRR 1000ML POUR (IV SOLUTION) ×1 IMPLANT

## 2022-09-15 NOTE — Progress Notes (Signed)
Refused cpap, does not wear a cpap at home.

## 2022-09-15 NOTE — Progress Notes (Signed)
Pt refused CPAP for tonight.  

## 2022-09-15 NOTE — Interval H&P Note (Signed)
History and Physical Interval Note:  09/15/2022 11:23 AM  [name] [name]  has presented today for surgery, with the diagnosis of Left Temporal Arteritis.  The various methods of treatment have been discussed with the patient and family. After consideration of risks, benefits and other options for treatment, the patient has consented to  Procedure(s): BIOPSY LEFT TEMPORAL ARTERY (Left) as a surgical intervention.  The patient's history has been reviewed, patient examined, no change in status, stable for surgery.  I have reviewed the patient's chart and labs.  Questions were answered to the patient's satisfaction.     Annamarie Major

## 2022-09-15 NOTE — Anesthesia Preprocedure Evaluation (Addendum)
Anesthesia Evaluation  Patient identified by MRN, date of birth, ID band Patient awake    Reviewed: Allergy & Precautions, NPO status , Patient's Chart, lab work & pertinent test results  History of Anesthesia Complications Negative for: history of anesthetic complications  Airway Mallampati: III  TM Distance: >3 FB Neck ROM: Limited    Dental  (+) Edentulous Upper, Edentulous Lower   Pulmonary sleep apnea , former smoker   Pulmonary exam normal        Cardiovascular hypertension, Pt. on medications + Peripheral Vascular Disease  Normal cardiovascular exam   '22 TTE - EF 60 to 65%. Mild mitral valve regurgitation. There is Moderate (Grade III) layered plaque involving the transverse aorta, ascending aorta and descending aorta.     Neuro/Psych  Headaches PSYCHIATRIC DISORDERS Anxiety Depression       GI/Hepatic Neg liver ROS,GERD  Medicated and Controlled,,  Endo/Other  negative endocrine ROS    Renal/GU CRFRenal disease Congenital absence of left kidney      Musculoskeletal  (+) Arthritis ,   PMR    Abdominal   Peds  Hematology  On plavix    Anesthesia Other Findings   Reproductive/Obstetrics                              Anesthesia Physical Anesthesia Plan  ASA: 3  Anesthesia Plan: MAC   Post-op Pain Management: Minimal or no pain anticipated   Induction:   PONV Risk Score and Plan: 2 and Propofol infusion and Treatment may vary due to age or medical condition  Airway Management Planned: Nasal Cannula and Natural Airway  Additional Equipment: None  Intra-op Plan:   Post-operative Plan:   Informed Consent: I have reviewed the patients History and Physical, chart, labs and discussed the procedure including the risks, benefits and alternatives for the proposed anesthesia with the patient or authorized representative who has indicated his/her understanding and acceptance.        Plan Discussed with: CRNA and Anesthesiologist  Anesthesia Plan Comments:          Anesthesia Quick Evaluation

## 2022-09-15 NOTE — Care Management Obs Status (Signed)
[facility] NOTIFICATION   Patient Details  Name: [name] [name] MRN: [id number] Date of Birth: 11-19-1940   Medicare Observation Status Notification Given:  Yes    Dawayne Patricia, RN 09/15/2022, 4:17 PM

## 2022-09-15 NOTE — Progress Notes (Signed)
PROGRESS NOTE    [name] [name]  [id number] DOB: 1940/09/28 DOA: 09/14/2022 PCP: Emelia Loron, NP   Brief Narrative:  HPI: [name] [name] is a 82 y.o. adult with medical history significant of anxiety, depression,  bilateral carpal tunnel syndrome, stage IIIb CKD, congenital absence of the left kidney, hyperlipidemia, hypertension, GERD, MRSA bacteremia, peripheral vascular disease history of pneumonia, sleep apnea, tremor, rheumatoid arthritis, polymyalgia rheumatica, history of headaches who is coming due to left-sided chest/head pain radiating to her left upper extremity with tingling sensation and also producing a 10/10 throbbing left-sided headache. She denied fever, chills, rhinorrhea, sore throat, wheezing or hemoptysis.  She is still having some post pneumonia cough, but not as frequent as before.  She has occasional sputum production.  No chest pain, palpitations, diaphoresis, PND, orthopnea or pitting edema of the lower extremities.  No abdominal pain, nausea, emesis, diarrhea, constipation, melena or hematochezia.  No flank pain, dysuria, frequency or hematuria.  No polyuria, polydipsia, polyphagia or blurred vision.    ED course: Initial vital signs were temperature 97.7 F, pulse 93, respiration 18, BP 151/75 mmHg O2 sat 95% on room air.  The patient received LR 500 mL bolus, methocarbamol 500 mg p.o., acetaminophen 1000 mg p.o., magnesium sulfate 2 g IVPB x 1 and metoclopramide 10 mg IVP.   Lab work: Her CBC showed a white count 5.3, hemoglobin 12.8 g/deciliter platelets 140.  ESR was 45 mm/h.  Troponin x 2 normal.  Lipase was 38.  CMP showed a glucose of 101 mg/dL and total protein of 6.4 g/dL.  The rest of the CMP measurements were normal.   Imaging: 2 view chest radiograph with no evidence of acute cardiopulmonary disease.  There was aortic atherosclerosis.  CTA head and neck with no emergent large vessel occlusion or evidence of dissection.  There is severe stenosis of the bilateral  vertebral artery origins.  There is 40% stenosis of the left common carotid artery origin and the proximal left ICA.  There is a 4 mm right upper lobe pulmonary nodule.  No follow-up needed if patient is low risk.  Assessment & Plan:   Principal Problem:   Headache Active Problems:   Essential hypertension   GERD (gastroesophageal reflux disease)   Glaucoma   OSA (obstructive sleep apnea)   Thrombocytopenia (HCC)  Left temporal headache: Presentation suspicion for left temporal arteritis.  Vascular surgery was consulted, patient is currently undergoing temporal artery biopsy.  Vision is not compromised so we will start her on prednisone 50 mg p.o. daily empirically after biopsy is completed.  Will await results.  Essential hypertension: Controlled.  Continue home dose of amlodipine and furosemide.  GERD: Continue PPI.  Glaucoma: Continue home drops.  OSA: Continue CPAP at bedtime.  Mild hypokalemia: Replace.  Chronic thrombocytopenia: Stable.  Monitor for bleeding.  DVT prophylaxis: SCDs Start: 09/14/22 1851   Code Status: DNR  Family Communication:  None present at bedside.  Plan of care discussed with patient in length and he/she verbalized understanding and agreed with it.  Status is: Observation The patient will require care spanning > 2 midnights and should be moved to inpatient because: Patient will require observation for another 24 hours after temporal artery biopsy today.   Estimated body mass index is 27.73 kg/m as calculated from the following:   Height as of this encounter: 5' (1.524 m).   Weight as of this encounter: 64.4 kg.    Nutritional Assessment: Body mass index is 27.73 kg/m.[name] Seen by dietician.  I agree with the assessment and plan as outlined below: Nutrition Status:        . Skin Assessment: I have examined the patient's skin and I agree with the wound assessment as performed by the wound care RN as outlined below:    Consultants:   Vascular surgery  Procedures:  None  Antimicrobials:  Anti-infectives (From admission, onward)    Start     Dose/Rate Route Frequency Ordered Stop   09/15/22 1130  ceFAZolin (ANCEF) IVPB 2g/100 mL premix        2 g 200 mL/hr over 30 Minutes Intravenous  Once 09/15/22 1124           Subjective: Patient seen and examined.  Still complains of the left temporal pain but currently 6 out of 10 whereas it was 10 out of 10 yesterday.  No other complaint.  Vision is intact.  Objective: Vitals:   09/14/22 2012 09/14/22 2303 09/15/22 0851 09/15/22 1109  BP: (!) 150/69 (!) 148/62 (!) 149/74 (!) 148/72  Pulse: 88 77 81 75  Resp: '18 16 19 18  '$ Temp: 98.2 F (36.8 C) 98.2 F (36.8 C) 98.1 F (36.7 C) 98 F (36.7 C)  TempSrc: Oral Oral Oral Oral  SpO2: 98% 93% 97% 94%  Weight:      Height:        Intake/Output Summary (Last 24 hours) at 09/15/2022 1140 Last data filed at 09/15/2022 [id number] Gross per 24 hour  Intake 300 ml  Output 1050 ml  Net -750 ml   Filed Weights   09/14/22 0629  Weight: 64.4 kg    Examination:  General exam: Appears calm and comfortable, left temporal tenderness. Respiratory system: Clear to auscultation. Respiratory effort normal. Cardiovascular system: S1 & S2 heard, RRR. No JVD, murmurs, rubs, gallops or clicks. No pedal edema. Gastrointestinal system: Abdomen is nondistended, soft and nontender. No organomegaly or masses felt. Normal bowel sounds heard. Central nervous system: Alert and oriented. No focal neurological deficits. Extremities: Symmetric 5 x 5 power. Skin: No rashes, lesions or ulcers Psychiatry: Judgement and insight appear normal. Mood & affect appropriate.    Data Reviewed: I have personally reviewed following labs and imaging studies  CBC: Recent Labs  Lab 09/14/22 0634 09/15/22 0125  WBC 5.3 4.6  NEUTROABS 3.4  --   HGB 12.8 11.6*  HCT 37.3 32.3*  MCV 100.5* 98.2  PLT 140* [id number]*   Basic Metabolic Panel: Recent Labs  Lab  09/14/22 0634 09/15/22 0125  NA 136 137  K 3.5 3.1*  CL 102 103  CO2 24 24  GLUCOSE 101* 86  BUN 13 11  CREATININE 0.83 0.92  CALCIUM 9.4 9.0   GFR: Estimated Creatinine Clearance (by C-G formula based on SCr of 0.92 mg/dL) Female: 38.8 mL/min Female: 48 mL/min Liver Function Tests: Recent Labs  Lab 09/14/22 0634  AST 16  ALT 9  ALKPHOS 62  BILITOT 0.9  PROT 6.4*  ALBUMIN 4.2   Recent Labs  Lab 09/14/22 0634  LIPASE 38   No results for input(s): "AMMONIA" in the last 168 hours. Coagulation Profile: No results for input(s): "INR", "PROTIME" in the last 168 hours. Cardiac Enzymes: No results for input(s): "CKTOTAL", "CKMB", "CKMBINDEX", "TROPONINI" in the last 168 hours. BNP (last 3 results) No results for input(s): "PROBNP" in the last 8760 hours. HbA1C: No results for input(s): "HGBA1C" in the last 72 hours. CBG: No results for input(s): "GLUCAP" in the last 168 hours. Lipid Profile: No results for input(s): "CHOL", "  HDL", "LDLCALC", "TRIG", "CHOLHDL", "LDLDIRECT" in the last 72 hours. Thyroid Function Tests: No results for input(s): "TSH", "T4TOTAL", "FREET4", "T3FREE", "THYROIDAB" in the last 72 hours. Anemia Panel: No results for input(s): "VITAMINB12", "FOLATE", "FERRITIN", "TIBC", "IRON", "RETICCTPCT" in the last 72 hours. Sepsis Labs: No results for input(s): "PROCALCITON", "LATICACIDVEN" in the last 168 hours.  Recent Results (from the past 240 hour(s))  MRSA Next Gen by PCR, Nasal     Status: Abnormal   Collection Time: 09/15/22  5:19 AM   Specimen: Nasal Mucosa; Nasal Swab  Result Value Ref Range Status   MRSA by PCR Next Gen DETECTED (A) NOT DETECTED Final    Comment: RESULT CALLED TO, READ BACK BY AND VERIFIED WITH: M. SHACKLETT 09/15/22 @ [id number] BY AB (NOTE) The GeneXpert MRSA Assay (FDA approved for NASAL specimens only), is one component of a comprehensive MRSA colonization surveillance program. It is not intended to diagnose MRSA infection nor  to guide or monitor treatment for MRSA infections. Test performance is not FDA approved in patients less than 6 years old. Performed at [facility], [place] [address], [place], [place] [address]      Radiology Studies: CT ANGIO HEAD NECK W WO CM  Result Date: 09/14/2022 CLINICAL DATA:  severe neck pain and headache, c/f arterial dissection EXAM: CT ANGIOGRAPHY HEAD AND NECK TECHNIQUE: Multidetector CT imaging of the head and neck was performed using the standard protocol during bolus administration of intravenous contrast. Multiplanar CT image reconstructions and MIPs were obtained to evaluate the vascular anatomy. Carotid stenosis measurements (when applicable) are obtained utilizing NASCET criteria, using the distal internal carotid diameter as the denominator. RADIATION DOSE REDUCTION: This exam was performed according to the departmental dose-optimization program which includes automated exposure control, adjustment of the mA and/or kV according to patient size and/or use of iterative reconstruction technique. CONTRAST:  66m OMNIPAQUE IOHEXOL 350 MG/ML SOLN COMPARISON:  CT Head 07/22/2022. FINDINGS: CT HEAD FINDINGS Brain: No evidence of acute infarction, hemorrhage, hydrocephalus, extra-axial collection or mass lesion/mass effect. Vascular: See below. Skull: No acute fracture. Sinuses/Orbits: Clear sinuses.  No acute orbital findings. Other: No mastoid effusions. Review of the MIP images confirms the above findings CTA NECK FINDINGS Aortic arch: Great vessel origins are patent without significant stenosis. Right carotid system: Patent. Atherosclerosis at the carotid bifurcation without greater than 50% stenosis. No evidence of dissection. Left carotid system: Patent. Approximately 40% stenosis of the common carotid artery origin. Atherosclerosis at the carotid bifurcation and proximal ICA with approximately 40% stenosis of the proximal ICA. No evidence of dissection. Vertebral arteries: Left  dominant. Severe stenosis of the bilateral vertebral artery origins. No evidence of dissection. Skeleton: ACDF. Other neck: No acute findings on limited assessment. 2.4 cm left thyroid nodule. Upper chest: Approximately 4 mm right upper lobe pulmonary nodule. Review of the MIP images confirms the above findings CTA HEAD FINDINGS Anterior circulation: Bilateral intracranial ICAs, MCAs and ACAs are patent without proximal hemodynamically significant stenosis. Posterior circulation: Bilateral intradural vertebral arteries, basilar artery and bilateral posterior cerebral arteries are patent without proximal hemodynamically significant stenosis. Venous sinuses: As permitted by contrast timing, patent. Review of the MIP images confirms the above findings IMPRESSION: 1. No emergent large vessel occlusion.  No evidence of dissection. 2. Severe stenosis of the bilateral vertebral artery origins. 3. Approximately 40% stenosis of the left common carotid artery origin and proximal left ICA. 4. Approximately 4 mm right upper lobe pulmonary nodule. No follow-up needed if patient is low-risk.This recommendation  follows the consensus statement: Guidelines for Management of Incidental Pulmonary Nodules Detected on CT Images: From the Fleischner Society 2017; Radiology 2017; 284:228-243. 5. Approximately 2.4 cm left thyroid nodule. Recommend thyroid US (ref: J Am Coll Radiol. 2015 Feb;12(2): 143-50). Electronically Signed   By: Margaretha Sheffield M.D.   On: 09/14/2022 09:24   DG Chest 2 View  Result Date: 09/14/2022 CLINICAL DATA:  82 year old female with history of chest pain and left arm pain. EXAM: CHEST - 2 VIEW COMPARISON:  Chest x-ray 07/22/2022. FINDINGS: Lung volumes are normal. No consolidative airspace disease. No pleural effusions. No pneumothorax. No pulmonary nodule or mass noted. Pulmonary vasculature and the cardiomediastinal silhouette are within normal limits. Atherosclerosis in the thoracic aorta. Orthopedic  fixation hardware in the lower cervical spine incidentally noted. IMPRESSION: 1.  No radiographic evidence of acute cardiopulmonary disease. 2. Aortic atherosclerosis. Electronically Signed   By: Vinnie Langton M.D.   On: 09/14/2022 06:55    Scheduled Meds:  [MAR Hold] amLODipine  2.5 mg Oral Daily   [MAR Hold] busPIRone  10 mg Oral TID   [MAR Hold] Chlorhexidine Gluconate Cloth  6 each Topical Q0600   [MAR Hold] divalproex  250 mg Oral BID   [MAR Hold] furosemide  20 mg Oral QODAY   [MAR Hold] gabapentin  200 mg Oral TID   [MAR Hold] mupirocin ointment  1 Application Nasal BID   [MAR Hold] pantoprazole  40 mg Oral Daily   Continuous Infusions:   ceFAZolin (ANCEF) IV     lactated ringers     [MAR Hold] potassium chloride 10 mEq (09/15/22 1034)     LOS: 0 days   Darliss Cheney, MD [facility]  09/15/2022, 11:40 AM   *Please note that this is a verbal dictation therefore any spelling or grammatical errors are due to the "[place] [facility]" system interpretation.  Please page via [place] and do not message via secure chat for urgent patient care matters. Secure chat can be used for non urgent patient care matters.  How to contact the [facility] Attending or Consulting provider [place] or covering provider during after hours [place], for this patient?  Check the care team in [facility] and look for a) attending/consulting [facility] provider listed and b) the [facility] team listed. Page or secure chat [phone]. Log into [email or web address] and use [place]'s universal password to access. If you do not have the password, please contact the hospital operator. Locate the [facility] provider you are looking for under [facility] and page to a number that you can be directly reached. If you still have difficulty reaching the provider, please page the [facility] (Director on Call) for the Hospitalists listed on amion for assistance.

## 2022-09-15 NOTE — Progress Notes (Signed)
Patient MRSA PCR for surgery came back Positive. MRSA standing orders started. Ailene Rud.N.[place]

## 2022-09-15 NOTE — Progress Notes (Addendum)
[facility]   Subjective  - Has a HA   Objective (!) 148/62 77 98.2 F (36.8 C) (Oral) 16 93%  Intake/Output Summary (Last 24 hours) at 09/15/2022 0700 Last data filed at 09/15/2022 0515 Gross per 24 hour  Intake 800 ml  Output 400 ml  Net 400 ml   Left temporal area pain with HA Moving all 4 extremities, no weakness noted B radial pulses palpable    Assessment/Planning: Plan for temporal artery biopsy to r/o temporal arteritis NPO Patient agrees to proceed with surgery   Roxy Horseman 09/15/2022 7:00 AM --  VASCULAR STAFF ADDENDUM: I have independently interviewed and examined the patient. I agree with the above.  Plan for LEFT temporal artery biopsy today  Cassandria Santee, MD [facility] Phone Number: [phone] 09/15/2022 8:30 AM    Laboratory Lab Results: Recent Labs    09/14/22 0634 09/15/22 0125  WBC 5.3 4.6  HGB 12.8 11.6*  HCT 37.3 32.3*  PLT 140* 119*   BMET Recent Labs    09/14/22 0634 09/15/22 0125  NA 136 137  K 3.5 3.1*  CL 102 103  CO2 24 24  GLUCOSE 101* 86  BUN 13 11  CREATININE 0.83 0.92  CALCIUM 9.4 9.0    COAG Lab Results  Component Value Date   INR 1.2 07/22/2022   INR 1.1 11/21/2020   INR 1.1 10/24/2020   No results found for: "PTT"

## 2022-09-15 NOTE — Op Note (Signed)
    Patient name: [name] [name] MRN: [id number] DOB: March 26, 1941 Sex: adult  09/15/2022 Pre-operative Diagnosis: Rule out temporal arteritis Post-operative diagnosis:  Same Surgeon:  Annamarie Major Procedure:   Left temporal artery biopsy Anesthesia:  MAC Blood Loss:  minimal Specimens: Left temporal artery to pathology as a permanent section  Findings: No significant inflammation surrounding the artery  Indications: This is a 82 year old female who presented with left-sided headaches.  Her ESR was elevated.  Temporal arteritis is within the differential and so request is been made for temporal artery biopsy.  Procedure:  The patient was identified in the holding area and taken to [place] [address]  The patient was then placed supine on the table. MAC anesthesia was administered.  The patient was prepped and draped in the usual sterile fashion.  A time out was called and antibiotics were administered.  Using Doppler, I mapped out the course of the temporal artery.  A 10 blade was used to make an incision.  Next using cautery and sharp dissection I dissected out the temporal artery.  Side branches were ligated between clips and ties.  Once I had dissected out an adequate length of the temporal artery, it was ligated proximally and distally and then removed and sent as a specimen.  The wound was then irrigated.  Hemostasis was achieved.  The incision was closed with 2 layers of Vicryl followed by Dermabond.  There were no immediate complications.   Disposition: To PACU stable.   Theotis Burrow, M.D., [facility] [place] Office: [phone] Pager:  [phone]

## 2022-09-15 NOTE — Anesthesia Postprocedure Evaluation (Signed)
Anesthesia Post Note  Patient: [name] [name]  Procedure(s) Performed: BIOPSY LEFT TEMPORAL ARTERY (Left: Face)     Patient location during evaluation: PACU Anesthesia Type: MAC Level of consciousness: awake and alert Pain management: pain level controlled Vital Signs Assessment: post-procedure vital signs reviewed and stable Respiratory status: spontaneous breathing, nonlabored ventilation and respiratory function stable Cardiovascular status: stable and blood pressure returned to baseline Anesthetic complications: no   No notable events documented.  Last Vitals:  Vitals:   09/15/22 1330 09/15/22 1346  BP: 127/63 (!) 110/98  Pulse: 70 73  Resp: 15 19  Temp: 36.5 C 36.4 C  SpO2: 95% 97%    Last Pain:  Vitals:   09/15/22 1346  TempSrc: Oral  PainSc: [place]

## 2022-09-15 NOTE — H&P (View-Only) (Signed)
[facility] [place]  Subjective  - Has a HA   Objective (!) 148/62 77 98.2 F (36.8 C) (Oral) 16 93%  Intake/Output Summary (Last 24 hours) at 09/15/2022 0700 Last data filed at 09/15/2022 0515 Gross per 24 hour  Intake 800 ml  Output 400 ml  Net 400 ml   Left temporal area pain with HA Moving all 4 extremities, no weakness noted B radial pulses palpable    Assessment/Planning: Plan for temporal artery biopsy to r/o temporal arteritis NPO Patient agrees to proceed with surgery   Roxy Horseman 09/15/2022 7:00 AM --  VASCULAR STAFF ADDENDUM: I have independently interviewed and examined the patient. I agree with the above.  Plan for LEFT temporal artery biopsy today  Cassandria Santee, MD [facility] Phone Number: [phone] 09/15/2022 8:30 AM    Laboratory Lab Results: Recent Labs    09/14/22 0634 09/15/22 0125  WBC 5.3 4.6  HGB 12.8 11.6*  HCT 37.3 32.3*  PLT 140* 119*   BMET Recent Labs    09/14/22 0634 09/15/22 0125  NA 136 137  K 3.5 3.1*  CL 102 103  CO2 24 24  GLUCOSE 101* 86  BUN 13 11  CREATININE 0.83 0.92  CALCIUM 9.4 9.0    COAG Lab Results  Component Value Date   INR 1.2 07/22/2022   INR 1.1 11/21/2020   INR 1.1 10/24/2020   No results found for: "PTT"

## 2022-09-15 NOTE — Anesthesia Procedure Notes (Signed)
Procedure Name: MAC Date/Time: 09/15/2022 12:07 PM  Performed by: Leonor Liv, CRNAPre-anesthesia Checklist: Patient identified, Emergency Drugs available, Suction available, Patient being monitored and Timeout performed Patient Re-evaluated:Patient Re-evaluated prior to induction Oxygen Delivery Method: Simple face mask Placement Confirmation: positive ETCO2 Dental Injury: Teeth and Oropharynx as per pre-operative assessment

## 2022-09-15 NOTE — Transfer of Care (Signed)
Immediate Anesthesia Transfer of Care Note  Patient: [name] [name]  Procedure(s) Performed: BIOPSY LEFT TEMPORAL ARTERY (Left: Face)  Patient Location: MAC  Anesthesia Type:MAC  Level of Consciousness: awake, alert , and oriented  Airway & Oxygen Therapy: Patient Spontanous Breathing  Post-op Assessment: Report given to RN and Post -op Vital signs reviewed and stable  Post vital signs: Reviewed and stable  Last Vitals:  Vitals Value Taken Time  BP 117/70 09/15/22 1317  Temp 36.5 C 09/15/22 1315  Pulse 78 09/15/22 1324  Resp 29 09/15/22 1324  SpO2 95 % 09/15/22 1324  Vitals shown include unvalidated device data.  Last Pain:  Vitals:   09/15/22 1315  TempSrc:   PainSc: 8       Patients Stated Pain Goal: 3 ([id number])  Complications: No notable events documented.

## 2022-09-16 ENCOUNTER — Encounter (HOSPITAL_COMMUNITY): Payer: Self-pay | Admitting: Surgery

## 2022-09-16 DIAGNOSIS — R519 Headache, unspecified: Secondary | ICD-10-CM | POA: Diagnosis not present

## 2022-09-16 LAB — BASIC METABOLIC PANEL
Anion gap: 10 (ref 5–15)
BUN: 16 mg/dL (ref 8–23)
CO2: 26 mmol/L (ref 22–32)
Calcium: 9.2 mg/dL (ref 8.9–10.3)
Chloride: 100 mmol/L (ref 98–111)
Creatinine, Ser: 1.13 mg/dL — ABNORMAL HIGH (ref 0.44–1.00)
GFR, Estimated: 48 mL/min — ABNORMAL LOW (ref >60)
Glucose, Bld: 139 mg/dL — ABNORMAL HIGH (ref 70–99)
Potassium: 4.2 mmol/L (ref 3.5–5.1)
Sodium: 136 mmol/L (ref 135–145)

## 2022-09-16 LAB — CBC WITH DIFFERENTIAL/PLATELET
Abs Immature Granulocytes: 0.02 10*3/uL (ref 0.00–0.07)
Basophils Absolute: 0 10*3/uL (ref 0.0–0.1)
Basophils Relative: 0 %
Eosinophils Absolute: 0 10*3/uL (ref 0.0–0.5)
Eosinophils Relative: 0 %
HCT: 37.5 % (ref 36.0–46.0)
Hemoglobin: 13.7 g/dL (ref 12.0–15.0)
Immature Granulocytes: 1 %
Lymphocytes Relative: 33 %
Lymphs Abs: 1.5 10*3/uL (ref 0.7–4.0)
MCH: 35.3 pg — ABNORMAL HIGH (ref 26.0–34.0)
MCHC: 36.5 g/dL — ABNORMAL HIGH (ref 30.0–36.0)
MCV: 96.6 fL (ref 80.0–100.0)
Monocytes Absolute: 0.1 10*3/uL (ref 0.1–1.0)
Monocytes Relative: 1 %
Neutro Abs: 2.9 10*3/uL (ref 1.7–7.7)
Neutrophils Relative %: 65 %
Platelets: 148 10*3/uL — ABNORMAL LOW (ref 150–400)
RBC: 3.88 MIL/uL (ref 3.87–5.11)
RDW: 13.1 % (ref 11.5–15.5)
WBC: 4.4 10*3/uL (ref 4.0–10.5)
nRBC: 0 % (ref 0.0–0.2)

## 2022-09-16 MED ORDER — MUPIROCIN 2 % EX OINT: 1.0000 | TOPICAL_OINTMENT | Freq: Two times a day (BID) | CUTANEOUS | 0 refills | Status: DC

## 2022-09-16 MED ORDER — PREDNISONE 10 MG PO TABS: ORAL_TABLET | ORAL | 0 refills | Status: DC

## 2022-09-16 NOTE — Discharge Summary (Signed)
Physician Discharge Summary  Patient ID: [name] [name] MRN: [id number] DOB/AGE: [birth year] 82 y.o.  Admit date: 09/14/2022 Discharge date: 09/16/2022  Admission Diagnoses:  Discharge Diagnoses:  Principal Problem:   Headache Active Problems:   Essential hypertension   GERD (gastroesophageal reflux disease)   Glaucoma   OSA (obstructive sleep apnea)   Thrombocytopenia (HCC)   Discharged Condition: stable  Hospital Course: Patient is an 82 year old female with past medical history significant for chronic kidney disease stage IIIb, congenital absence of the left kidney, hyperlipidemia, hypertension, GERD, MRSA bacteremia, peripheral vascular disease, pneumonia, sleep apnea, rheumatoid arthritis, trauma, polymyalgia rheumatica, headaches, anxiety and depression.  Patient was admitted with left-sided throbbing headache and chest pain radiating to the left upper extremity with associated tingling sensation.  Troponin came back normal.  EKG was normal.  ESR was 45 mm/h.  CTA head and neck revealed no emergent large vessel occlusion or evidence of dissection.  There was severe stenosis of the bilateral vertebral artery origins.  There was 40% stenosis of the left common carotid artery origin and the proximal left ICA.  There was a 4 mm right upper lobe pulmonary nodule.  No follow-up needed if patient is low risk.  Vascular surgery team was consulted to assist with patient's management.  Patient underwent biopsy of the left temporal artery.  Final results are still pending.  Patient was managed with steroids.  Patient will be discharged on tapering dose of oral prednisone.  Vascular surgery team and primary care provider.  Currently follow final results of the temporal artery pathology and adjust the prednisone accordingly.  Headache has resolved significantly.  Patient is eager to be discharged back home.    Essential hypertension: Controlled.  Continued home dose of amlodipine and furosemide.   GERD:  Continued PPI.   Glaucoma: Continued home drops.   OSA: Continued CPAP at bedtime.   Mild hypokalemia: Replaced.   Chronic thrombocytopenia: Stable.    Consults: vascular surgery.  Vascular surgery team was consulted due to concerns for possible temporal arteritis.  Patient underwent biopsy of the temporal artery.  Final results are still pending.  Patient will be discharging prednisone.  Primary care provider and surgical team will follow final pathology results and adjust steroids accordingly.  Significant Diagnostic Studies:  -Temporal artery biopsy.  Treatments: Patient was managed with steroids.  Patient will be discharged back home on tapering dose of steroids.  PCP and vascular surgery team will kindly follow final pathology results of the temporal artery biopsy.  Discharge Exam: Blood pressure (!) 152/81, pulse 97, temperature 98.1 F (36.7 C), temperature source Oral, resp. rate 16, height 5' (1.524 m), weight 64.4 kg, SpO2 98 %.   Disposition: Discharge disposition: 01-Home or Self Care       Discharge Instructions     Diet - low sodium heart healthy   Complete by: As directed    Increase activity slowly   Complete by: As directed       Allergies as of 09/16/2022       Reactions   Statins    Statins Swelling, Rash, Other (See Comments)   Swelling involving tongue; also causes muscle pain        Medication List     STOP taking these medications    amoxicillin 500 MG capsule Commonly known as: AMOXIL   clopidogrel 75 MG tablet Commonly known as: PLAVIX   HYDROcodone-acetaminophen 5-325 MG tablet Commonly known as: NORCO/VICODIN   loperamide 2 MG capsule Commonly known as: IMODIUM  TAKE these medications    acetaminophen 500 MG tablet Commonly known as: TYLENOL Take 500 mg by mouth every 8 (eight) hours as needed for moderate pain.   albuterol 108 (90 Base) MCG/ACT inhaler Commonly known as: VENTOLIN HFA Inhale 1 puff into the  lungs daily as needed for shortness of breath.   amLODipine 2.5 MG tablet Commonly known as: NORVASC Take 1 tablet (2.5 mg total) by mouth daily.   busPIRone 10 MG tablet Commonly known as: BUSPAR Take 10 mg by mouth 3 (three) times daily.   divalproex 125 MG DR tablet Commonly known as: DEPAKOTE Take 250 mg by mouth 2 (two) times daily.   furosemide 20 MG tablet Commonly known as: LASIX Take 20 mg by mouth every other day.   gabapentin 100 MG capsule Commonly known as: NEURONTIN Take 200 mg by mouth 3 (three) times daily.   mupirocin ointment 2 % Commonly known as: BACTROBAN Place 1 Application into the nose 2 (two) times daily.   pantoprazole 40 MG tablet Commonly known as: PROTONIX Take 40 mg by mouth daily.   predniSONE 10 MG tablet Commonly known as: DELTASONE Prednisone 50 mg po daily for 2 weeks, then 40 mg po daily for 2 weeks, then 35 mg po daily for 2 weeks, then 30 mg po daily for 2 weeks, then 25 mg po daily for 2 weeks and then 20 mg po daily for 2 weeks, and then decrease by 2.5 mg every 2 weeks until the dose of 10 mg po daily and then decrease further by 1 mg every month. Start taking on: September 17, 2022   traMADol 50 MG tablet Commonly known as: ULTRAM Take 50 mg by mouth 3 (three) times daily as needed for moderate pain.        Follow-up Information     [facility] Follow up.   Specialty: Vascular Surgery Why: As needed Contact information: [address] [place] [phone]                Time spent: 38 minutes.   SignedBonnell Public 09/16/2022, 2:02 PM

## 2022-09-16 NOTE — Progress Notes (Signed)
  Progress Note    09/16/2022 8:31 AM 1 Day Post-Op  Subjective:  still has a little headache but improved.  Wants to go home  Afebrile   Vitals:   09/16/22 0414 09/16/22 0725  BP: 121/79 (!) 152/81  Pulse: 85 97  Resp: 13 16  Temp: 97.6 F (36.4 C) 98.1 F (36.7 C)  SpO2: 94% 98%    Physical Exam: General:  no distress Lungs:  non labored Incisions:  healing nicely   CBC    Component Value Date/Time   WBC 4.4 09/16/2022 0124   RBC 3.88 09/16/2022 0124   HGB 13.7 09/16/2022 0124   HGB 12.0 06/03/2019 1047   HCT 37.5 09/16/2022 0124   HCT 34.7 06/03/2019 1047   PLT 148 (L) 09/16/2022 0124   PLT 167 06/03/2019 1047   MCV 96.6 09/16/2022 0124   MCV 100 (H) 06/03/2019 1047   MCH 35.3 (H) 09/16/2022 0124   MCHC 36.5 (H) 09/16/2022 0124   RDW 13.1 09/16/2022 0124   RDW 14.3 06/03/2019 1047   LYMPHSABS 1.5 09/16/2022 0124   LYMPHSABS 2.3 06/03/2019 1047   MONOABS 0.1 09/16/2022 0124   EOSABS 0.0 09/16/2022 0124   EOSABS 0.1 06/03/2019 1047   BASOSABS 0.0 09/16/2022 0124   BASOSABS 0.0 06/03/2019 1047    BMET    Component Value Date/Time   NA 136 09/16/2022 0124   NA 141 06/03/2019 1047   K 4.2 09/16/2022 0124   CL 100 09/16/2022 0124   CO2 26 09/16/2022 0124   GLUCOSE 139 (H) 09/16/2022 0124   BUN 16 09/16/2022 0124   BUN 15 06/03/2019 1047   CREATININE 1.13 (H) 09/16/2022 0124   CREATININE 1.07 (H) 08/01/2016 1512   CALCIUM 9.2 09/16/2022 0124   GFRNONAA 48 (L) 09/16/2022 0124   GFRNONAA 50 (L) 08/01/2016 1512   GFRAA >60 09/08/2019 0133   GFRAA 58 (L) 08/01/2016 1512    INR    Component Value Date/Time   INR 1.2 07/22/2022 1146     Intake/Output Summary (Last 24 hours) at 09/16/2022 0831 Last data filed at 09/16/2022 0419 Gross per 24 hour  Intake 610 ml  Output 2460 ml  Net -1850 ml      Assessment/Plan:  82 y.o. adult is s/p:  Left temporal artery bx  1 Day Post-Op   -pt doing well this morning.  Incision looks fine -ok for  discharge from vascular standpoint.  F/u as needed with vascular.  Dr. Trula Slade to call results to pt.    Leontine Locket, PA-C [facility] [phone] 09/16/2022 8:31 AM

## 2022-09-18 LAB — SURGICAL PATHOLOGY

## 2022-09-26 ENCOUNTER — Telehealth: Payer: Self-pay | Admitting: *Deleted

## 2022-09-26 NOTE — Telephone Encounter (Signed)
Pt daughter called regarding advice if her mom should be still taking steroids.  RNCM advised them to call surgeon's ( Dr. Trula Slade) office and request to speak to his nurse for a more accurate answer.

## 2022-10-03 ENCOUNTER — Encounter: Payer: Self-pay | Admitting: Family Medicine

## 2022-10-03 ENCOUNTER — Ambulatory Visit (INDEPENDENT_AMBULATORY_CARE_PROVIDER_SITE_OTHER): Payer: HMO | Admitting: Family Medicine

## 2022-10-03 VITALS — BP 136/68 | HR 102 | Ht 60.0 in | Wt 151.0 lb

## 2022-10-03 DIAGNOSIS — D696 Thrombocytopenia, unspecified: Secondary | ICD-10-CM

## 2022-10-03 DIAGNOSIS — I739 Peripheral vascular disease, unspecified: Secondary | ICD-10-CM | POA: Diagnosis not present

## 2022-10-03 DIAGNOSIS — E041 Nontoxic single thyroid nodule: Secondary | ICD-10-CM

## 2022-10-03 DIAGNOSIS — Z0000 Encounter for general adult medical examination without abnormal findings: Secondary | ICD-10-CM

## 2022-10-03 DIAGNOSIS — I10 Essential (primary) hypertension: Secondary | ICD-10-CM | POA: Diagnosis not present

## 2022-10-03 DIAGNOSIS — R928 Other abnormal and inconclusive findings on diagnostic imaging of breast: Secondary | ICD-10-CM

## 2022-10-03 NOTE — Patient Instructions (Addendum)
It was wonderful to meet you today. Thank you for allowing me to be a part of your care. Below is a short summary of what we discussed at your visit today:  Establishing as a patient at the [facility] Today we reviewed all of your health history, including past medical conditions, medications, past surgeries, and allergies.  We also reviewed your vaccine status and necessary screenings, those are discussed below.  Labs Today we obtained quite a few labs to evaluate how you are doing currently.  We are checking your blood cell counts, blood electrolytes, cholesterol, blood sugar, and markers of liver and kidney function.  If everything is normal, we will send you letter.  If anything is out of the ordinary, I will give you call with the results and plan going forward.  Medicare Annual Wellness Exam Your chart indicates that you are due for your Medicare annual wellness exam.  Your insurance likes [place] to do one of these every year.  This is a nurse only visit that takes about 30 minutes to an hour.  It can be done either in person or virtually.  This is an in-depth visit that focuses on preventative care and keeping you healthy.  Somebody from our clinic will be calling you soon to get this scheduled.  Mammogram I have ordered your routine mammogram to screen for breast cancer. This will be at the [facility]. You will call them directly to make an appointment at your convenience. Information below.     Please bring all of your medications to every appointment!  If you have any questions or concerns, please do not hesitate to contact us via phone or MyChart message.   Ezequiel Essex, MD

## 2022-10-03 NOTE — Progress Notes (Signed)
    SUBJECTIVE:   CHIEF COMPLAINT / HPI:   Establish care [name] [name] is a pleasant 82 y.o. woman who presents to establish care.  Medications and allergies updated.  She returns to [place] for care after being at [facility] for several years.   Vaccines due: Flu, COVID, shingles (says she has gotten all these including the pneumonia) Screenings due: None Pap: Aged out, last 05/13/2015, NILM Mammogram: Aged out, Last mammogram 2019 Colonoscopy: Per patient, last colonoscopy in 2015 with [facility], normal Lung CT: Previously discussed,  DEXA: Last 06/21/2011 at age 75, shows osteoporosis, T-score -2.5   Eye drops: - dorzolamide HCL / timolol maleate 2%/0.5% - brimonidine tartrate 0.15%  Would like mammogram Last @ [facility] in 2022 - because of what they found (bone fragment and fatty tissue), would want to repeat - due for it now - would feel better if she had another  Has DNR and Advanced Directive - will bring to put on file  PERTINENT  PMH / PSH: HTN, PVD, thyroid nodule, GERD, cervical spondylosis with radiculopathy, congenital absence of 1 kidney, CKD, resting tremor, bilateral primary open-angle glaucoma, polymyalgia rheumatica, MDD, HLD  OBJECTIVE:   BP 136/68   Pulse (!) 102   Ht 5' (1.524 m)   Wt 151 lb (68.5 kg)   SpO2 97%   BMI 29.49 kg/m    Physical Exam General: Awake, alert, oriented Cardiovascular: Regular rate and rhythm, S1 and S2 present, no murmurs auscultated Respiratory: Lung fields clear to auscultation bilaterally Abdomen: Soft, nondistended, no TTP in any quadrant, no rebound tenderness or guarding Extremities: No bilateral lower extremity edema, palpable pedal and pretibial pulses bilaterally Neuro: Cranial nerves II through X grossly intact, able to move all extremities spontaneously   ASSESSMENT/PLAN:   Essential hypertension At goal given age. Would focus on diastolics. No changes to management.   Healthcare maintenance Will  obtain updated labs today for her known medical conditions.  - patient to sign ROI form to obtain records from [place] - patient to bring in copy of DNR and AD for filing - needs Medicare AWV, admin to call and schedule - BMP, CBC, A1c, TSH, lipids  Mammogram abnormal Patient reports prior abnormal mammogram through [facility]. We do not have copies of those records. She requests one more mammogram to check on things. Will order.     Ezequiel Essex, MD [place]

## 2022-10-04 LAB — BASIC METABOLIC PANEL
BUN/Creatinine Ratio: 43 — ABNORMAL HIGH (ref 12–28)
BUN: 41 mg/dL — ABNORMAL HIGH (ref 8–27)
CO2: 22 mmol/L (ref 20–29)
Calcium: 9.2 mg/dL (ref 8.7–10.3)
Chloride: 103 mmol/L (ref 96–106)
Creatinine, Ser: 0.95 mg/dL (ref 0.57–1.00)
Glucose: 81 mg/dL (ref 70–99)
Potassium: 4.7 mmol/L (ref 3.5–5.2)
Sodium: 140 mmol/L (ref 134–144)
eGFR: 59 mL/min/{1.73_m2} — ABNORMAL LOW (ref >59)

## 2022-10-04 LAB — LIPID PANEL
Chol/HDL Ratio: 1.9 ratio (ref 0.0–4.4)
Cholesterol, Total: 212 mg/dL — ABNORMAL HIGH (ref 100–199)
HDL: 111 mg/dL (ref >39)
LDL Chol Calc (NIH): 81 mg/dL (ref 0–99)
Triglycerides: 118 mg/dL (ref 0–149)
VLDL Cholesterol Cal: 20 mg/dL (ref 5–40)

## 2022-10-04 LAB — HEMOGLOBIN A1C
Est. average glucose Bld gHb Est-mCnc: 94 mg/dL
Hgb A1c MFr Bld: 4.9 % (ref 4.8–5.6)

## 2022-10-04 LAB — CBC
Hematocrit: 40.3 % (ref 34.0–46.6)
Hemoglobin: 13.9 g/dL (ref 11.1–15.9)
MCH: 35 pg — ABNORMAL HIGH (ref 26.6–33.0)
MCHC: 34.5 g/dL (ref 31.5–35.7)
MCV: 102 fL — ABNORMAL HIGH (ref 79–97)
Platelets: 176 10*3/uL (ref 150–450)
RBC: 3.97 x10E6/uL (ref 3.77–5.28)
RDW: 13.4 % (ref 11.7–15.4)
WBC: 18.2 10*3/uL — ABNORMAL HIGH (ref 3.4–10.8)

## 2022-10-04 LAB — T4F: T4,Free (Direct): 0.93 ng/dL (ref 0.82–1.77)

## 2022-10-04 LAB — TSH RFX ON ABNORMAL TO FREE T4: TSH: 0.417 u[IU]/mL — ABNORMAL LOW (ref 0.450–4.500)

## 2022-10-08 DIAGNOSIS — R928 Other abnormal and inconclusive findings on diagnostic imaging of breast: Secondary | ICD-10-CM

## 2022-10-08 HISTORY — DX: Other abnormal and inconclusive findings on diagnostic imaging of breast: R92.8

## 2022-10-08 NOTE — Assessment & Plan Note (Signed)
At goal given age. Would focus on diastolics. No changes to management.

## 2022-10-08 NOTE — Assessment & Plan Note (Addendum)
Patient reports prior abnormal mammogram through [place]. We do not have copies of those records. She requests one more mammogram to check on things. Will order.

## 2022-10-08 NOTE — Assessment & Plan Note (Addendum)
Will obtain updated labs today for her known medical conditions.  - patient to sign ROI form to obtain records from [place] - patient to bring in copy of DNR and AD for filing - needs Medicare AWV, admin to call and schedule - BMP, CBC, A1c, TSH, lipids

## 2022-10-09 ENCOUNTER — Other Ambulatory Visit: Payer: Self-pay

## 2022-10-10 ENCOUNTER — Telehealth: Payer: Self-pay | Admitting: Family Medicine

## 2022-10-10 DIAGNOSIS — R7989 Other specified abnormal findings of blood chemistry: Secondary | ICD-10-CM

## 2022-10-10 DIAGNOSIS — E041 Nontoxic single thyroid nodule: Secondary | ICD-10-CM

## 2022-10-10 MED ORDER — GABAPENTIN 100 MG PO CAPS: 100.0000 mg | ORAL_CAPSULE | Freq: Three times a day (TID) | ORAL | 3 refills | Status: DC

## 2022-10-13 ENCOUNTER — Ambulatory Visit (INDEPENDENT_AMBULATORY_CARE_PROVIDER_SITE_OTHER): Payer: HMO

## 2022-10-13 DIAGNOSIS — Z0000 Encounter for general adult medical examination without abnormal findings: Secondary | ICD-10-CM | POA: Diagnosis not present

## 2022-10-13 NOTE — Patient Instructions (Signed)

## 2022-10-13 NOTE — Progress Notes (Signed)
I connected with  [name] [name] on 10/13/22 by a audio enabled telemedicine application and verified that I am speaking with the correct person using two identifiers.  Patient Location: Home  Provider Location: Home Office  I discussed the limitations of evaluation and management by telemedicine. The patient expressed understanding and agreed to proceed.  Subjective:   [name] [name] is a 82 y.o. female who presents for an Initial Medicare Annual Wellness Visit.  Review of Systems    Per HPI unless specifically indicated below.  Cardiac Risk Factors include: advanced age (>3555men, 80>65 women);female gender, Essential Hypertension, and Dyslipidemia.           Objective:       10/03/2022    1:45 PM 09/16/2022    7:25 AM 09/16/2022    4:14 AM  Vitals with BMI  Height 5\' 0"     Weight 151 lbs    BMI 29.49    Systolic 136 [id number]  Diastolic 68 81 79  Pulse 102 97 85    Today's Vitals   10/13/22 1543  PainSc: 6    There is no height or weight on file to calculate BMI.     10/03/2022    1:44 PM 09/15/2022   11:06 AM 09/14/2022    6:30 AM 07/22/2022   10:46 AM 05/02/2022    5:35 PM 05/01/2022   11:47 AM 11/25/2020    7:27 AM  Advanced Directives  Does Patient Have a Medical Advance Directive?  Yes No Yes Yes Yes No  Type of Advance Directive Living will Living will  Healthcare Power of [place]ary[place]orney;Living will;Out of facility DNR (pink MOST or yellow form) Healthcare Power of [place] Healthcare Power of [place];Living will   Does patient want to make changes to medical advance directive?     Yes (ED - send information to MyChart)    Copy of Healthcare Power of Attorney in Chart?     No - copy requested    Would patient like information on creating a medical advance directive? No - Patient declined          Current Medications (verified) Outpatient Encounter Medications as of 10/13/2022  Medication Sig   acetaminophen (TYLENOL) 500 MG tablet Take 500 mg by mouth every 8 (eight) hours  as needed for moderate pain.   albuterol (VENTOLIN HFA) 108 (90 Base) MCG/ACT inhaler Inhale 1 puff into the lungs daily as needed for shortness of breath.   amLODipine (NORVASC) 2.5 MG tablet Take 1 tablet (2.5 mg total) by mouth daily.   busPIRone (BUSPAR) 10 MG tablet Take 10 mg by mouth 3 (three) times daily.   clopidogrel (PLAVIX) 75 MG tablet Take 75 mg by mouth daily.   divalproex (DEPAKOTE) 125 MG DR tablet Take 250 mg by mouth 2 (two) times daily.   furosemide (LASIX) 20 MG tablet Take 20 mg by mouth every other day.   gabapentin (NEURONTIN) 100 MG capsule Take 1 capsule (100 mg total) by mouth 3 (three) times daily.   mupirocin ointment (BACTROBAN) 2 % Place 1 Application into the nose 2 (two) times daily.   pantoprazole (PROTONIX) 40 MG tablet Take 40 mg by mouth daily.   traMADol (ULTRAM) 50 MG tablet Take 50 mg by mouth 3 (three) times daily as needed for moderate pain.   [DISCONTINUED] predniSONE (DELTASONE) 10 MG tablet Prednisone 50 mg po daily for 2 weeks, then 40 mg po daily for 2 weeks, then 35 mg po daily for 2  weeks, then 30 mg po daily for 2 weeks, then 25 mg po daily for 2 weeks and then 20 mg po daily for 2 weeks, and then decrease by 2.5 mg every 2 weeks until the dose of 10 mg po daily and then decrease further by 1 mg every month. (Patient not taking: Reported on 10/13/2022)   No facility-administered encounter medications on file as of 10/13/2022.    Allergies (verified) Statins and Statins   History: Past Medical History:  Diagnosis Date   Anxiety    Arthritis    RA   Carpal tunnel syndrome, bilateral    Chronic kidney disease    CKD stage III, absence of left kidney   CKD (chronic kidney disease) stage 3, GFR 30-59 ml/min 09/06/2019   Congenital absence of one kidney    Pt has right kidney only   Depression    Dyslipidemia 09/06/2019   Essential hypertension 09/06/2019   GERD (gastroesophageal reflux disease)    Headache    Heart murmur     Hypercholesteremia    Hypertension    MRSA bacteremia 10/29/2020   Peripheral vascular disease    a. s/p R external iliac stent '06 with angioplasty '13 (Dr. Carollee Massed) b. 01/2016: s/p PTA/stenting in L common iliac artery (Dr. Allyson Sabal)   Pneumonia of right lower lobe due to infectious organism 11/11/2017   Polymyalgia rheumatica    Psoriasis    Sepsis due to pneumonia 11/21/2020   Sepsis with acute hypoxic respiratory failure 09/01/2020   Sleep apnea    Tremor 09/06/2019   Past Surgical History:  Procedure Laterality Date   ABDOMINAL HYSTERECTOMY  1963   Partial,  Due to bleeding after delivery   ANTERIOR CERVICAL DECOMP/DISCECTOMY FUSION  04/19/2012   Procedure: ANTERIOR CERVICAL DECOMPRESSION/DISCECTOMY FUSION 2 LEVELS;  Surgeon: Carmela Hurt, MD;  Location: [facility] NEURO ORS;  Service: Neurosurgery;  Laterality: N/A;  Cervical four-five,Cervical five-six anterior cervical decompression with fusion plating and bonegraft possible posterior cervical decompression   APPENDECTOMY     ARTERY BIOPSY Left 09/15/2022   Procedure: BIOPSY LEFT TEMPORAL ARTERY;  Surgeon: Nada Libman, MD;  Location: [facility] OR;  Service: Vascular;  Laterality: Left;   CARPAL TUNNEL RELEASE  1990   CERVICAL FUSION  04/19/2012   EYE SURGERY     ILIAC ARTERY STENT     PERIPHERAL VASCULAR CATHETERIZATION N/A 01/31/2016   Procedure: Abdominal Aortogram;  Surgeon: Runell Gess, MD;  Location: [facility] INVASIVE CV LAB;  Service: Cardiovascular;  Laterality: N/A;   PERIPHERAL VASCULAR CATHETERIZATION Bilateral 01/31/2016   Procedure: Lower Extremity Angiography;  Surgeon: Runell Gess, MD;  Location: [facility] INVASIVE CV LAB;  Service: Cardiovascular;  Laterality: Bilateral;   PERIPHERAL VASCULAR CATHETERIZATION Left 01/31/2016   Procedure: Peripheral Vascular Intervention;  Surgeon: Runell Gess, MD;  Location: [facility] INVASIVE CV LAB;  Service: Cardiovascular;  Laterality: Left;  ILIAC   TEE WITHOUT CARDIOVERSION N/A 11/01/2020   Procedure:  TRANSESOPHAGEAL ECHOCARDIOGRAM (TEE);  Surgeon: Quintella Reichert, MD;  Location: [facility] ENDOSCOPY;  Service: Cardiovascular;  Laterality: N/A;   TEE WITHOUT CARDIOVERSION N/A 11/25/2020   Procedure: TRANSESOPHAGEAL ECHOCARDIOGRAM (TEE);  Surgeon: Quintella Reichert, MD;  Location: [facility] ENDOSCOPY;  Service: Cardiovascular;  Laterality: N/A;   TONSILLECTOMY     Family History  Problem Relation Age of Onset   Angina Mother    Congestive Heart Failure Mother    Heart attack Father    Melanoma Brother    Congestive Heart Failure Brother  Heart disease Brother    Cancer Sister        Lymphoma   Neuropathy Sister    Brain cancer Sister    Osteoarthritis Daughter    Social History   Socioeconomic History   Marital status: Widowed    Spouse name: Not on file   Number of children: 1   Years of education: Not on file   Highest education level: Not on file  Occupational History   Occupation: Retired  Tobacco Use   Smoking status: Former    Packs/day: 1.00    Years: 40.00    Additional pack years: 0.00    Total pack years: 40.00    Types: Cigarettes    Quit date: 07/24/2010    Years since quitting: 12.2   Smokeless tobacco: Never   Tobacco comments:    STARTED BACK JULY 2013 AND RECENTLY QUIT 03-10-2012  Vaping Use   Vaping Use: Never used  Substance and Sexual Activity   Alcohol use: Never    Comment: QUIT IN 1999   Drug use: Never   Sexual activity: Not Currently    Comment: 1st intercourse 82 yo-Fewer than 5 partners  Other Topics Concern   Not on file  Social History Narrative   ** Merged History Encounter **       Social Determinants of Health   Financial Resource Strain: Low Risk  (10/13/2022)   Overall Financial Resource Strain (CARDIA)    Difficulty of Paying Living Expenses: Not hard at all  Food Insecurity: No Food Insecurity (10/13/2022)   Hunger Vital Sign    Worried About Running Out of Food in the Last Year: Never true    Ran Out of Food in the Last Year: Never true   Transportation Needs: No Transportation Needs (10/13/2022)   PRAPARE - Administrator, Civil Service (Medical): No    Lack of Transportation (Non-Medical): No  Physical Activity: Insufficiently Active (10/13/2022)   Exercise Vital Sign    Days of Exercise per Week: 7 days    Minutes of Exercise per Session: 10 min  Stress: Stress Concern Present (10/13/2022)   [employer] of Occupational Health - Occupational Stress Questionnaire    Feeling of Stress : Very much  Social Connections: Moderately Integrated (10/13/2022)   Social Connection and Isolation Panel [NHANES]    Frequency of Communication with Friends and Family: More than three times a week    Frequency of Social Gatherings with Friends and Family: More than three times a week    Attends Religious Services: More than 4 times per year    Active Member of [employer] or Organizations: Yes    Attends Banker Meetings: Never    Marital Status: Widowed    Tobacco Counseling Counseling given: No Tobacco comments: STARTED BACK JULY 2013 AND RECENTLY QUIT 03-10-2012   Clinical Intake:  Pre-visit preparation completed: Yes  Pain : 0-10 Pain Score: 6  Pain Type: Chronic pain Pain Location: Leg Pain Orientation: Left, Right Pain Descriptors / Indicators: Aching Pain Onset: More than a month ago Pain Frequency: Constant  Pain Score: 6 Pain Location: Leg Pain Descriptors / Indicators: Aching  Nutritional Status: BMI of 19-24  Normal Nutritional Risks: Unintentional weight gain  How often do you need to have someone help you when you read instructions, pamphlets, or other written materials from your doctor or pharmacy?: 3 - Sometimes  Diabetic?No  Interpreter Needed?: No  Information entered by :: Laurel Dimmer, CMA   Activities of  Daily Living    10/13/2022    3:41 PM 05/02/2022    5:35 PM  In your present state of health, do you have any difficulty performing the following activities:  Hearing? 1  0  Vision? 1 0  Difficulty concentrating or making decisions? 1 0  Walking or climbing stairs? 1 1  Dressing or bathing? 1 1  Doing errands, shopping? 1 1    Patient Care Team: Fayette Pho, MD as PCP - General (Family Medicine) Runell Gess, MD as PCP - Cardiology (Cardiology) Arvilla Market, MD (Inactive) (Family Medicine)  Indicate any recent Medical Services you may have received from other than [facility] providers in the past year (date may be approximate).     Assessment:   This is a routine wellness examination for [facility].  Hearing/Vision screen Admits to some hearing loss that she associates with her age. Admits to some change in vision. Wear glasses, due for an Annual Eye Exam.  Dietary issues and exercise activities discussed: Current Exercise Habits: Structured exercise class, Type of exercise: walking, Time (Minutes): 10, Frequency (Times/Week): 5, Weekly Exercise (Minutes/Week): 50, Intensity: Mild, Exercise limited by: orthopedic condition(s)   Goals Addressed   None    Depression Screen    10/13/2022    3:40 PM 05/26/2019    2:54 PM 08/15/2018    3:27 PM 05/29/2018    1:38 PM 05/21/2018    2:35 PM 05/07/2018    3:53 PM 03/27/2018    9:56 AM  PHQ 2/9 Scores  PHQ - 2 Score 0 2 0 1 1 1 6   PHQ- 9 Score  4     20    Fall Risk    10/13/2022    3:41 PM 12/14/2020    3:00 PM 05/29/2018    1:38 PM 05/21/2018    2:35 PM 05/07/2018    3:53 PM  Fall Risk   Falls in the past year? 1 0 1 1 Yes  Number falls in past yr: 0 0  1 1  Injury with Fall? 1 0  1 Yes  Risk Factor Category      High Fall Risk  Risk for fall due to : Impaired balance/gait    History of fall(s)  Follow up Falls evaluation completed Falls evaluation completed   Falls prevention discussed    FALL RISK PREVENTION PERTAINING TO THE HOME:  Any stairs in or around the home? No  If so, are there any without handrails? No  Home free of loose throw rugs in walkways, pet beds, electrical  cords, etc? Yes  Adequate lighting in your home to reduce risk of falls? Yes   ASSISTIVE DEVICES UTILIZED TO PREVENT FALLS:  Life alert? No  Use of a cane, walker or w/c? Yes  Grab bars in the bathroom? Yes  Shower chair or bench in shower? Yes  Elevated toilet seat or a handicapped toilet? Yes   TIMED UP AND GO:  Was the test performed? Unable to perform, virtual appointment    Cognitive Function:        10/13/2022    3:50 PM  6CIT Screen  What Year? 0 points  What month? 0 points  What time? 0 points  Count back from 20 0 points  Months in reverse 0 points  Repeat phrase 2 points  Total Score 2 points    Immunizations Immunization History  Administered Date(s) Administered   Influenza Whole 04/08/2012   Influenza,inj,Quad PF,6+ Mos 03/22/2014, 03/10/2015, 03/30/2016, 03/09/2017, 03/22/2018  Influenza,inj,quad, With Preservative 04/17/2019   Influenza-Unspecified 05/09/2013   Moderna Sars-Covid-2 Vaccination 10/10/2019, 05/28/2020   Pneumococcal Conjugate-13 04/09/2011, 05/08/2014   Pneumococcal Polysaccharide-23 09/11/2013   Tdap 08/10/2014   Zoster Recombinat (Shingrix) 10/13/2017   Zoster, Live 04/09/2011    TDAP status: Up to date  Flu Vaccine status: Up to date  Pneumococcal vaccine status: Up to date  Covid-19 vaccine status: Completed vaccines  Qualifies for Shingles Vaccine? Yes   Zostavax completed Yes   Shingrix Completed?: Yes  Screening Tests Health Maintenance  Topic Date Due   Zoster Vaccines- Shingrix (2 of 2) 12/08/2017   COVID-19 Vaccine (3 - Moderna risk series) 06/25/2020   INFLUENZA VACCINE  02/08/2023   Medicare Annual Wellness (AWV)  10/13/2023   DTaP/Tdap/Td (2 - Td or Tdap) 08/10/2024   Pneumonia Vaccine 13+ Years old  Completed   DEXA SCAN  Completed   HPV VACCINES  Aged Out    Health Maintenance  Health Maintenance Due  Topic Date Due   Zoster Vaccines- Shingrix (2 of 2) 12/08/2017   COVID-19 Vaccine (3 - Moderna  risk series) 06/25/2020    Colorectal cancer screening: No longer required.   Mammogram status: No longer required due to age.  DEXA Scan: 06/21/2011  Lung Cancer Screening: (Low Dose CT Chest recommended if Age 15-80 years, 30 pack-year currently smoking OR have quit w/in 15years.) does not qualify.   Lung Cancer Screening Referral: not applicable   Additional Screening:  Hepatitis C Screening: does not qualify  Vision Screening: Recommended annual ophthalmology exams for early detection of glaucoma and other disorders of the eye. Is the patient up to date with their annual eye exam?  No  Who is the provider or what is the name of the office in which the patient attends annual eye exams?  If pt is not established with a provider, would they like to be referred to a provider to establish care? No .   Dental Screening: Recommended annual dental exams for proper oral hygiene  Community Resource Referral / Chronic Care Management: CRR required this visit?  No   CCM required this visit?  No      Plan:     I have personally reviewed and noted the following in the patient's chart:   Medical and social history Use of alcohol, tobacco or illicit drugs  Current medications and supplements including opioid prescriptions. Patient is not currently taking opioid prescriptions. Functional ability and status Nutritional status Physical activity Advanced directives List of other physicians Hospitalizations, surgeries, and ER visits in previous 12 months Vitals Screenings to include cognitive, depression, and falls Referrals and appointments  In addition, I have reviewed and discussed with patient certain preventive protocols, quality metrics, and best practice recommendations. A written personalized care plan for preventive services as well as general preventive health recommendations were provided to patient.     Lonna Cobb, CMA   10/13/2022   Nurse Notes: Approximately 30  minute Non-Face -To-Face Medicare Wellness Visit

## 2022-10-16 ENCOUNTER — Telehealth: Payer: Self-pay | Admitting: Family Medicine

## 2022-10-16 DIAGNOSIS — E041 Nontoxic single thyroid nodule: Secondary | ICD-10-CM

## 2022-10-16 DIAGNOSIS — D729 Disorder of white blood cells, unspecified: Secondary | ICD-10-CM

## 2022-10-16 DIAGNOSIS — R7989 Other specified abnormal findings of blood chemistry: Secondary | ICD-10-CM

## 2022-10-16 NOTE — Telephone Encounter (Signed)
Called patient to discuss labs. No answer. VM positively identified the patient by name, left detailed message.   TSH low. Not on methimazole. Will refer to endocrine.   History of thyroid nodule, no chart documentation of repeat scan as recommended. Will obtain thyroid ultrasound.   I would like [name] [name] to return to our clinic lab for repeat labs at her earliest convenience. Will order future CBC, BMP, and TSH w/ reflex T4.   Fayette Pho, MD

## 2022-10-16 NOTE — Telephone Encounter (Signed)
Error

## 2022-10-17 ENCOUNTER — Telehealth: Payer: Self-pay

## 2022-10-17 NOTE — Telephone Encounter (Signed)
Called and scheduled appointment for patient.  Thyroid Ultrasound 10/23/2022 [facility] 1330 with arrival at 1315  Will call patient and inform her.  Glennie Hawk, CMA'

## 2022-10-17 NOTE — Telephone Encounter (Signed)
Patient is aware of appointment and given the number for rescheduling if needed.  Glennie Hawk, CMA;

## 2022-10-23 ENCOUNTER — Ambulatory Visit (HOSPITAL_COMMUNITY): Admission: RE | Admit: 2022-10-23 | Payer: HMO | Source: Ambulatory Visit

## 2022-10-23 ENCOUNTER — Emergency Department (HOSPITAL_COMMUNITY)
Admission: EM | Admit: 2022-10-23 | Discharge: 2022-10-23 | Disposition: A | Discharge status: Home / Self Care | Payer: HMO | Attending: Emergency Medicine | Admitting: Emergency Medicine

## 2022-10-23 DIAGNOSIS — Z7901 Long term (current) use of anticoagulants: Secondary | ICD-10-CM | POA: Diagnosis not present

## 2022-10-23 DIAGNOSIS — R040 Epistaxis: Secondary | ICD-10-CM | POA: Diagnosis present

## 2022-10-23 DIAGNOSIS — I10 Essential (primary) hypertension: Secondary | ICD-10-CM | POA: Diagnosis not present

## 2022-10-23 DIAGNOSIS — Z79899 Other long term (current) drug therapy: Secondary | ICD-10-CM | POA: Diagnosis not present

## 2022-10-23 LAB — CBC
HCT: 37 % (ref 36.0–46.0)
Hemoglobin: 12 g/dL (ref 12.0–15.0)
MCH: 34.4 pg — ABNORMAL HIGH (ref 26.0–34.0)
MCHC: 32.4 g/dL (ref 30.0–36.0)
MCV: 106 fL — ABNORMAL HIGH (ref 80.0–100.0)
Platelets: 239 10*3/uL (ref 150–400)
RBC: 3.49 MIL/uL — ABNORMAL LOW (ref 3.87–5.11)
RDW: 14.3 % (ref 11.5–15.5)
WBC: 6.3 10*3/uL (ref 4.0–10.5)
nRBC: 0 % (ref 0.0–0.2)

## 2022-10-23 NOTE — ED Triage Notes (Addendum)
Pt BIb PTAR for a nosebleed that began around 6am.  The pt states that there was a puddle in the trashcan this morning.  EMs was called out this AM.  They did a saline spray and ice and it slowed down.  PT was not transported.  An unknown time later the pt states a long clot was pulled from her nose that caused pain in her forehead and the bleeding began again.  The pt and her aid spent time trying to stop it before calling EMS.   EMS states that the gauze presently in her nose has been there since EMS arrived around 13:13. Pt has felt increasingly fatigued en route.  Pt is on Plavix. Pt had temporal artery biopsy on left side recently and began having bleeding while in hospital for that as well.   BP 158/90

## 2022-10-23 NOTE — ED Provider Notes (Signed)
[place] EMERGENCY DEPARTMENT AT [facility] Provider Note   CSN: [id number] Arrival date & time: 10/23/22  1416     History  No chief complaint on file.   [name] [name] is a 82 y.o. adult.  HPI 82 year old female with a history of hypertension, PMR, peripheral vascular disease, hypertension, and multiple other comorbidities presents with nosebleed.  She has been having nosebleed essentially since around 6 AM.  EMS had to come out twice and around 2 PM they gave her some Afrin.  Feels like it is slowed down and right now is not currently bleeding.  Mostly she feels it running down the back of her throat, she thinks is coming from her left side.  Started after she sneezed this morning.  She is on Plavix.  She has been feeling fatigued since around noon but denies any chest pain, shortness of breath, lightheadedness.  Home Medications Prior to Admission medications   Medication Sig Start Date End Date Taking? Authorizing Provider  acetaminophen (TYLENOL) 500 MG tablet Take 500 mg by mouth every 8 (eight) hours as needed for moderate pain.    [provider]  albuterol (VENTOLIN HFA) 108 (90 Base) MCG/ACT inhaler Inhale 1 puff into the lungs daily as needed for shortness of breath. 09/01/22   [provider]  amLODipine (NORVASC) 2.5 MG tablet Take 1 tablet (2.5 mg total) by mouth daily. 11/05/20   Lanae Boast, MD  busPIRone (BUSPAR) 10 MG tablet Take 10 mg by mouth 3 (three) times daily. 04/08/22   [provider]  clopidogrel (PLAVIX) 75 MG tablet Take 75 mg by mouth daily.    [provider]  divalproex (DEPAKOTE) 125 MG DR tablet Take 250 mg by mouth 2 (two) times daily. 08/12/19   [provider]  furosemide (LASIX) 20 MG tablet Take 20 mg by mouth every other day. 07/17/22   [provider]  gabapentin (NEURONTIN) 100 MG capsule Take 1 capsule (100 mg total) by mouth 3 (three) times daily. 10/10/22   Fayette Pho, MD  mupirocin  ointment (BACTROBAN) 2 % Place 1 Application into the nose 2 (two) times daily. 09/16/22   Berton Mount I, MD  pantoprazole (PROTONIX) 40 MG tablet Take 40 mg by mouth daily. 08/21/19   [provider]  traMADol (ULTRAM) 50 MG tablet Take 50 mg by mouth 3 (three) times daily as needed for moderate pain. 06/07/22   [provider]      Allergies    Statins and Statins    Review of Systems   Review of Systems  Constitutional:  Positive for fatigue.  HENT:  Positive for nosebleeds.   Respiratory:  Negative for shortness of breath.   Cardiovascular:  Negative for chest pain.  Neurological:  Negative for light-headedness.    Physical Exam Updated Vital Signs BP 130/80 (BP Location: Left Arm)   Pulse 100   Temp 98.5 F (36.9 C) (Oral)   Resp 18   SpO2 96%  Physical Exam Vitals and nursing note reviewed.  Constitutional:      Appearance: She is well-developed.  HENT:     Head: Normocephalic and atraumatic.     Nose:     Comments: There is some dried blood on the end of the gauze in her left nare. However, I don't see an obvious source of bleeding or current bleeding in either nare. Cardiovascular:     Rate and Rhythm: Normal rate and regular rhythm.     Pulses:  Radial pulses are 2+ on the left side.  Pulmonary:     Effort: Pulmonary effort is normal.  Abdominal:     General: There is no distension.  Skin:    General: Skin is warm and dry.  Neurological:     Mental Status: She is alert.     ED Results / Procedures / Treatments   Labs (all labs ordered are listed, but only abnormal results are displayed) Labs Reviewed  CBC - Abnormal; Notable for the following components:      Result Value   RBC 3.49 (*)    MCV 106.0 (*)    MCH 34.4 (*)    All other components within normal limits    EKG None  Radiology No results found.  Procedures Procedures    Medications Ordered in ED Medications - No data to display  ED Course/ Medical  Decision Making/ A&P                             Medical Decision Making Amount and/or Complexity of Data Reviewed Labs: ordered.    Details: Hemoglobin normal at 12.0.  Normal platelets.   Patient presents with epistaxis.  Currently when I am seeing her there is no epistaxis though due to her feeling a little more fatigued after this a CBC was sent.  Overall unremarkable.  When I went back to evaluate her, still no bleeding currently though she states she did have a small clot come out and some transient bleeding.  I think this might be posterior but given she is not having active bleeding and has only had minimal bleeding here, I think packing would be unnecessary.  She received the Afrin bottle from EMS and was instructed to use this.  Otherwise will refer to ENT but I do not think emergent management is otherwise needed currently.  Will give return precautions.        Final Clinical Impression(s) / ED Diagnoses Final diagnoses:  Left-sided epistaxis    Rx / DC Orders ED Discharge Orders     None         Pricilla Loveless, MD 10/23/22 1733

## 2022-10-23 NOTE — Discharge Instructions (Addendum)
You may use the Afrin twice a day for up to 3 days total.  Be sure to avoid sneezing/nose blowing.    If you develop uncontrolled recurrent bleeding, dizziness or lightheadedness, or any other new/concerning symptoms then return to the ER or call 911.

## 2022-10-24 ENCOUNTER — Other Ambulatory Visit: Payer: Self-pay | Admitting: Family Medicine

## 2022-10-24 DIAGNOSIS — R040 Epistaxis: Secondary | ICD-10-CM

## 2022-10-24 NOTE — Progress Notes (Signed)
Saw ED note for nose bleed, including referral to ENT. I did not see an ENT referral on file for her (as far as is visible in her chart) so I've placed a referral myself.   Fayette Pho, MD

## 2022-10-27 ENCOUNTER — Ambulatory Visit (HOSPITAL_COMMUNITY): Admission: RE | Admit: 2022-10-27 | Payer: HMO | Source: Ambulatory Visit

## 2022-10-30 ENCOUNTER — Ambulatory Visit (INDEPENDENT_AMBULATORY_CARE_PROVIDER_SITE_OTHER): Payer: HMO | Admitting: Family Medicine

## 2022-10-30 ENCOUNTER — Encounter: Payer: Self-pay | Admitting: Family Medicine

## 2022-10-30 VITALS — BP 138/76 | HR 95 | Ht 59.5 in | Wt 153.6 lb

## 2022-10-30 DIAGNOSIS — R040 Epistaxis: Secondary | ICD-10-CM | POA: Diagnosis not present

## 2022-10-30 DIAGNOSIS — L304 Erythema intertrigo: Secondary | ICD-10-CM

## 2022-10-30 DIAGNOSIS — Z23 Encounter for immunization: Secondary | ICD-10-CM

## 2022-10-30 MED ORDER — NYSTATIN 100000 UNIT/GM EX POWD
1.0000 | Freq: Three times a day (TID) | CUTANEOUS | 3 refills | Status: DC
Start: 2022-10-30 — End: 2023-03-19

## 2022-10-30 MED ORDER — SALINE SPRAY 0.65 % NA SOLN
NASAL | 2 refills | Status: AC
Start: 2022-10-30 — End: ?

## 2022-10-30 MED ORDER — NYSTATIN-TRIAMCINOLONE 100000-0.1 UNIT/GM-% EX OINT: 1.0000 | TOPICAL_OINTMENT | Freq: Two times a day (BID) | CUTANEOUS | 3 refills | Status: DC

## 2022-10-30 MED ORDER — NASAL MOIST NA GEL
NASAL | 2 refills | Status: AC
Start: 2022-10-30 — End: ?

## 2022-10-30 MED ORDER — ZOSTER VAC RECOMB ADJUVANTED 50 MCG/0.5ML IM SUSR: 0.5000 mL | Freq: Once | INTRAMUSCULAR | 0 refills | Status: AC

## 2022-10-30 NOTE — Progress Notes (Unsigned)
   SUBJECTIVE:   CHIEF COMPLAINT / HPI:   [name] presents today with her new home health aide for Ed follow up.   ED follow up Seen ED 4/15 for epistaxis. Recurrent problem prompting multiple ED visits.  Takes Plavix for CAD/PAD -  mod to severe 3 vessel coronary atherosclerosis on imaging Given afrin bottle previously for active nose bleeds   I sent a referral 4/16 to ENT - however she reports she has not received a call yet to schedule Denies any recurrence of nose bleed since the ED  Under breast irritation One week duration Reports the under breast area is quite tender and moist Has been placing gauze underneath breasts to assist with skin barrier and moisture wicking Trying silvadene cream from daughter, doesn't see any improvement  PERTINENT  PMH / PSH: CAD and PAD on Plavix, HLD, MDD, primary open angle glaucoma, CKD, congenital absence of one kidney  OBJECTIVE:   BP 138/76   Pulse 95   Ht 4' 11.5" (1.511 m)   Wt 153 lb 9.6 oz (69.7 kg)   SpO2 93%   BMI 30.50 kg/m    Gen: awake, alert, NAD HEENT: nares unremarkable, no visible bleeding or scabs, moist oral mucosa without lesion Cardiac: RRR no murmur Resp: CTAB Skin: erythematous macerated skin underneath bilateral breasts, gauze stuck to irritated skin  ASSESSMENT/PLAN:   Epistaxis None today or since ED visit.  - nasal moisturizing with saline spray BID followed by application of nasal gel - Afrin for use in acute nose bleed - referred to ENT, provided info, patient to call for appointment  Need for shingles vaccine Printed script given today for administration at pharmacy.   Intertrigo Nystatin cream TID while skin is raw, then switch to nystatin powder TID. Aide present aware of plan.      Fayette Pho, MD [facility]

## 2022-10-30 NOTE — Patient Instructions (Addendum)
It was wonderful to see you today. Thank you for allowing me to be a part of your care. Below is a short summary of what we discussed at your visit today:  Hospital follow up Keep a bottle of Afrin on hand and use it if your nose starts to bleed.  Every day nasal moisturizing regimen:  - nasal saline twice daily - blow out nose and pat nares dry - apply nasal gel in each nare twice daily  You were referred to the following ENT clinic. Please call them directly for an appointment.  [facility] Address: [address] [place], [place], [place] [address] Phone: [phone]  Shingles Vaccine I have written a prescription for the shingles vaccine.  Please take this to your pharmacy to have this administered. Your insurance prefers you get this specific vaccine at the pharmacy instead of in clinic.   COVID booster Available here in clinic. Please let us know when you would like this done.    Please bring all of your medications to every appointment!  If you have any questions or concerns, please do not hesitate to contact us via phone or MyChart message.   Fayette Pho, MD

## 2022-10-31 ENCOUNTER — Encounter: Payer: Self-pay | Admitting: Family Medicine

## 2022-10-31 DIAGNOSIS — Z23 Encounter for immunization: Secondary | ICD-10-CM | POA: Insufficient documentation

## 2022-10-31 NOTE — Assessment & Plan Note (Signed)
None today or since ED visit.  - nasal moisturizing with saline spray BID followed by application of nasal gel - Afrin for use in acute nose bleed - referred to ENT, provided info, patient to call for appointment

## 2022-10-31 NOTE — Assessment & Plan Note (Signed)
Printed script given today for administration at pharmacy.

## 2022-10-31 NOTE — Assessment & Plan Note (Signed)
Nystatin cream TID while skin is raw, then switch to nystatin powder TID. Aide present aware of plan.

## 2022-12-01 ENCOUNTER — Ambulatory Visit: Payer: HMO

## 2022-12-08 ENCOUNTER — Other Ambulatory Visit: Payer: Self-pay

## 2022-12-08 ENCOUNTER — Ambulatory Visit (INDEPENDENT_AMBULATORY_CARE_PROVIDER_SITE_OTHER): Payer: HMO | Admitting: Student

## 2022-12-08 VITALS — BP 159/78 | HR 75 | Ht 59.0 in | Wt 158.6 lb

## 2022-12-08 DIAGNOSIS — K219 Gastro-esophageal reflux disease without esophagitis: Secondary | ICD-10-CM

## 2022-12-08 MED ORDER — SUCRALFATE 1 G PO TABS: 1.0000 g | ORAL_TABLET | Freq: Three times a day (TID) | ORAL | 5 refills | Status: DC

## 2022-12-08 NOTE — Assessment & Plan Note (Signed)
Patient presents with 3 to 4 weeks of dysphagia and GERD symptoms.  Patient notes difficulty swallowing solids and liquids, feel like they both get stuck in her chest, and require her to spit them back up.  Patient also notes she is having a lot of reflux despite having taking her Protonix daily.  Patient has not had any weight loss, but given dysphagia and GERD, there is concern for malignancy. Will refer to GI and start Carafate, and adjust PPI. Patient's GERD seems to be poorly controlled and patient is at risk for Barrett's esophagus/malignancy, given possible obstruction.  - Carafate with meals and nightly - Protonix twice daily - Ambulatory referral to GI

## 2022-12-08 NOTE — Patient Instructions (Signed)
It was great to see you! Thank you for allowing me to participate in your care!  I recommend that you always bring your medications to each appointment as this makes it easy to ensure we are on the correct medications and helps [place] not miss when refills are needed.  Our plans for today:  - Swallowing Issues This sounds like your GERD is really uncontrolled and there may be concern for an obstruction or malignancy in your esophagus. We will send you to the GI doctors for evaluation. - GERD  Carafate: take 4 times a day (with meals and before bed)   Crush pill into powder and mix with water, then drink it  Protonix: Take more frequently   Take 40 mg twice a day (AM and PM)   Take care and seek immediate care sooner if you develop any concerns.   Dr. Bess Kinds, MD [facility]

## 2022-12-08 NOTE — Progress Notes (Unsigned)
SUBJECTIVE:   CHIEF COMPLAINT / HPI:   Problem swallowing Started about a year ago, and would go away/return. Comes back stronger and stays longer. Drinking cold things helps to ease it. Drinking fluids sometimes sits in chest and sometimes comes back up, for last 2-3 weeks. Is also waking up with similar symptoms, feeling like she has to burp/vomit. What comes up is usually bubbles/foam, but no blood. Also notes she's sore from throat to belly. Also notes that when eating solid food, it always get's stuck. Is eating less 2/2 to things getting stuck. Denies any blood/coughing of blood. Feels like throat is raw. Also notes her reflux is worse and is still having symptoms.     PERTINENT  PMH / PSH: GERD   Patient Care Team: Fayette Pho, MD as PCP - General (Family Medicine) Runell Gess, MD as PCP - Cardiology (Cardiology) Arvilla Market, MD (Inactive) (Family Medicine) OBJECTIVE:  BP (!) 159/78   Pulse 75   Ht 4\' 11"  (1.499 m)   Wt 158 lb 9.6 oz (71.9 kg)   SpO2 97%   BMI 32.03 kg/m  Physical Exam Constitutional:      General: She is not in acute distress.    Appearance: Normal appearance. She is not ill-appearing.  HENT:     Mouth/Throat:     Mouth: Mucous membranes are moist.     Pharynx: No oropharyngeal exudate or posterior oropharyngeal erythema.  Cardiovascular:     Rate and Rhythm: Normal rate and regular rhythm.     Pulses: Normal pulses.     Heart sounds: Normal heart sounds. No murmur heard.    No friction rub. No gallop.  Pulmonary:     Effort: Pulmonary effort is normal. No respiratory distress.     Breath sounds: Normal breath sounds. No stridor. No wheezing, rhonchi or rales.  Chest:     Chest wall: Tenderness present.  Abdominal:     General: There is no distension.     Palpations: Abdomen is soft. There is no mass.     Tenderness: There is abdominal tenderness (TTP following tract of esophagus). There is no guarding.  Neurological:      Mental Status: She is alert.      ASSESSMENT/PLAN:  Gastroesophageal reflux disease, unspecified whether esophagitis present Assessment & Plan: Patient presents with 3 to 4 weeks of dysphagia and GERD symptoms.  Patient notes difficulty swallowing solids and liquids, feel like they both get stuck in her chest, and require her to spit them back up.  Patient also notes she is having a lot of reflux despite having taking her Protonix daily.  Patient has not had any weight loss, but given dysphagia and GERD, there is concern for malignancy. Will refer to GI and start Carafate, and adjust PPI. Patient's GERD seems to be poorly controlled and patient is at risk for Barrett's esophagus/malignancy, given possible obstruction.  - Carafate with meals and nightly - Protonix twice daily - Ambulatory referral to GI  Orders: -     Ambulatory referral to Gastroenterology  Other orders -     Sucralfate; Take 1 tablet (1 g total) by mouth 4 (four) times daily -  with meals and at bedtime. Crush into powder, add water, and drink when taking  Dispense: 120 tablet; Refill: 5   No follow-ups on file. Bess Kinds, MD 12/08/2022, 1:55 PM PGY-2, [facility] {    This will disappear when note is signed, click to select method of  visit    :1}

## 2022-12-13 ENCOUNTER — Other Ambulatory Visit: Payer: Self-pay

## 2022-12-13 ENCOUNTER — Other Ambulatory Visit: Payer: Self-pay | Admitting: "Endocrinology

## 2022-12-13 ENCOUNTER — Other Ambulatory Visit: Payer: HMO

## 2022-12-13 DIAGNOSIS — E041 Nontoxic single thyroid nodule: Secondary | ICD-10-CM

## 2022-12-14 LAB — TSH: TSH: 2.56 mIU/L (ref 0.40–4.50)

## 2022-12-14 LAB — T4, FREE: Free T4: 1 ng/dL (ref 0.8–1.8)

## 2022-12-15 ENCOUNTER — Ambulatory Visit: Payer: HMO | Admitting: "Endocrinology

## 2022-12-29 ENCOUNTER — Ambulatory Visit (INDEPENDENT_AMBULATORY_CARE_PROVIDER_SITE_OTHER): Payer: HMO | Admitting: Family Medicine

## 2022-12-29 ENCOUNTER — Encounter: Payer: Self-pay | Admitting: Family Medicine

## 2022-12-29 ENCOUNTER — Encounter: Payer: Self-pay | Admitting: Gastroenterology

## 2022-12-29 VITALS — BP 136/62 | HR 92 | Wt 153.6 lb

## 2022-12-29 DIAGNOSIS — F419 Anxiety disorder, unspecified: Secondary | ICD-10-CM

## 2022-12-29 DIAGNOSIS — M4722 Other spondylosis with radiculopathy, cervical region: Secondary | ICD-10-CM

## 2022-12-29 DIAGNOSIS — L304 Erythema intertrigo: Secondary | ICD-10-CM | POA: Diagnosis not present

## 2022-12-29 DIAGNOSIS — L821 Other seborrheic keratosis: Secondary | ICD-10-CM

## 2022-12-29 DIAGNOSIS — R1111 Vomiting without nausea: Secondary | ICD-10-CM
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
## 2022-12-29 NOTE — Patient Instructions (Addendum)
It was wonderful to see you today. Thank you for allowing me to be a part of your care. Below is a short summary of what we discussed at your visit today:  Medication refills I have refilled your medications. Please come back   Skin concerns Refill of your cream today.  Please make an appointment for the skin clinic or dermatologist clinic here in the clinic.  Make the appointment at the front desk.   GI concerns [facility] Address: [address], [place], [place] [address] Phone: [phone]  Anxiety Increase your Buspar from one tablet to two tablets three times daily.  Come back in one month to check in.  Find a therapist. Call your insurance to find what therapists are covered.  For information on therapists, please go to [email or web address]. You can also contact your insurance company to find an in-network therapist.  [facility] Available as walk-in brain health care 24/7 [phone] [address], [place], [place] [address] [email or web address]      If you have any questions or concerns, please do not hesitate to contact us via phone or MyChart message.   Fayette Pho, MD

## 2022-12-31 DIAGNOSIS — R1111 Vomiting without nausea: Secondary | ICD-10-CM | POA: Insufficient documentation

## 2022-12-31 HISTORY — DX: Vomiting without nausea: R11.11

## 2022-12-31 MED ORDER — GABAPENTIN 100 MG PO CAPS: 100.0000 mg | ORAL_CAPSULE | Freq: Three times a day (TID) | ORAL | 3 refills | Status: DC

## 2022-12-31 MED ORDER — BUSPIRONE HCL 10 MG PO TABS: 20.0000 mg | ORAL_TABLET | Freq: Three times a day (TID) | ORAL | 2 refills | Status: DC

## 2022-12-31 MED ORDER — DIVALPROEX SODIUM 125 MG PO DR TAB: 250.0000 mg | DELAYED_RELEASE_TABLET | Freq: Two times a day (BID) | ORAL | 2 refills | Status: DC

## 2022-12-31 MED ORDER — TRAMADOL HCL 50 MG PO TABS: 50.0000 mg | ORAL_TABLET | Freq: Three times a day (TID) | ORAL | 1 refills | Status: DC | PRN

## 2022-12-31 MED ORDER — NYSTATIN-TRIAMCINOLONE 100000-0.1 UNIT/GM-% EX OINT: 1.0000 | TOPICAL_OINTMENT | Freq: Two times a day (BID) | CUTANEOUS | 3 refills | Status: DC

## 2022-12-31 NOTE — Assessment & Plan Note (Signed)
Patient with concerns regarding skin lesions in axilla, flank, and on back. What I am seeing on axilla appears different from intertrigo of breasts. Left axilla with 3 skin colored greasy scaly lesions with "Stuck on" appearance much like SK. On her back I am able to identify any discrete lesions at this time. Recommend she return for a visit with [place] at our skin clinic or dermatology clinic for further evaluation, including possible crytotherapy. Patient amenable.

## 2022-12-31 NOTE — Assessment & Plan Note (Signed)
Worsened PHQ-9 with positive #9. Patient does not have plans to act on thoughts, simply passive SI at this time. Patient reports she "would never do such as thing" and feels safe. Does appear to be severely worsened due to adult daughter moving in and tensions therein. Discussed that we can adjust medication, but that medication can only do so much to help cope with external stressors.  - increase buspar from 10 to 20 mg TID (now at max dose) - encouraged patient (with help of aide) to find therapist in her insurance network - aide here today reports she is familiar with [facility] in [address]

## 2022-12-31 NOTE — Assessment & Plan Note (Signed)
Chronic pain. Pain clinic PA at [facility] has requested that PCP take over prescribing. Patient stable on her medications without adverse side effects. Will refill tramadol, gabapentin, and depakote. PDMP reviewed and appropriate.

## 2022-12-31 NOTE — Assessment & Plan Note (Signed)
Patient continued to have vomiting and loose stools. Was referred to [facility] at the end of May for GERD with dysphagia. Patient has not yet received a call. Reassuringly, no blood in vomit, stool, or urine. No significant weight loss per our records. Will provide [facility] information and encourage her to call. Aide here today voices that she will help call as soon as they get home.

## 2022-12-31 NOTE — Assessment & Plan Note (Signed)
Appears greatly improved from previous visit. Will refill the nystatin cream. Tolerating well without adverse side effects.

## 2023-01-22 ENCOUNTER — Other Ambulatory Visit: Payer: Self-pay

## 2023-01-23 MED ORDER — PANTOPRAZOLE SODIUM 40 MG PO TBEC: 40.0000 mg | DELAYED_RELEASE_TABLET | Freq: Two times a day (BID) | ORAL | 1 refills | Status: DC

## 2023-02-07 ENCOUNTER — Ambulatory Visit (INDEPENDENT_AMBULATORY_CARE_PROVIDER_SITE_OTHER): Payer: HMO | Admitting: "Endocrinology

## 2023-02-07 ENCOUNTER — Encounter: Payer: Self-pay | Admitting: "Endocrinology

## 2023-02-07 VITALS — BP 120/85 | HR 88 | Ht 59.0 in | Wt 159.4 lb

## 2023-02-07 DIAGNOSIS — E041 Nontoxic single thyroid nodule: Secondary | ICD-10-CM | POA: Diagnosis not present

## 2023-02-07 NOTE — Progress Notes (Signed)
Outpatient Endocrinology Note [name] Penalosa, MD  02/07/23   [name] [name] 1941-01-24 [id number]  Referring Provider: Doreene Eland, MD Primary Care Provider: Glendale Chard, DO Subjective  Chief Complaint  Patient presents with   Thyroid Nodule    Assessment & Plan  [name] [name] was seen today for thyroid nodule.  Diagnoses and all orders for this visit:  Thyroid nodule -     US THYROID; Future -     US THYROID  2.4 cm left thyroid nodule incidentally found on CT angiography neck on 09/24/2019 for during hospitalization.   Patient reports dysphagia and is able to swallow soft foods.  Thyroid labs WNL, never been on thyroid medication.   Recommend thyroid ultrasound, will follow-up with FNA if indicated.  I have reviewed current medications, nurse's notes, allergies, vital signs, past medical and surgical history, family medical history, and social history for this encounter. Counseled patient on symptoms, examination findings, lab findings, imaging results, treatment decisions and monitoring and prognosis. The patient understood the recommendations and agrees with the treatment plan. All questions regarding treatment plan were fully answered.   Return in about 1 year (around 02/07/2024).   [name] S[place], MD  02/07/23   I have reviewed current medications, nurse's notes, allergies, vital signs, past medical and surgical history, family medical history, and social history for this encounter. Counseled patient on symptoms, examination findings, lab findings, imaging results, treatment decisions and monitoring and prognosis. The patient understood the recommendations and agrees with the treatment plan. All questions regarding treatment plan were fully answered.   History of Present Illness [name] [name] is a 82 y.o. year old adult who presents to our clinic with thyroid nodule diagnosed in 09/14/22 found incidentally during hospitalization.    Patient is here with her home health  aide.  Patient reports feeling cold at baseline, weight going up and down within 5 pounds and staying tired.  She denies any constipation/excessive bowel or palpitations.  Compressive symptoms:  dysphagia  Yes dysphonia  Yes positional dyspnea (especially with simultaneous arms elevation)  No  Smokes  No On biotin  No Personal history of head/neck surgery/irradiation  Yes-vertebral surgery through neck   Patient has history of active skin cancer.  Patient has home health aid on Mon and Fri.    09/14/22 CT ANGIOGRAPHY HEAD AND NECK   2.4 cm left thyroid nodule.   Physical Exam  BP 120/85   Pulse 88   Ht 4\' 11"  (1.499 m)   Wt 159 lb 6.4 oz (72.3 kg)   SpO2 98%   BMI 32.19 kg/m  Constitutional: well developed, well nourished Head: normocephalic, atraumatic, no exophthalmos Eyes: sclera anicteric, no redness Neck: no thyromegaly, no thyroid tenderness; no nodules palpated Lungs: normal respiratory effort Neurology: alert and oriented, no fine hand tremor Skin: dry, no appreciable rashes Musculoskeletal: no appreciable defects Psychiatric: normal mood and affect  Allergies Allergies  Allergen Reactions   Statins    Statins Swelling, Rash and Other (See Comments)    Swelling involving tongue; also causes muscle pain    Current Medications Patient's Medications  New Prescriptions   No medications on file  Previous Medications   ACETAMINOPHEN (TYLENOL) 500 MG TABLET    Take 500 mg by mouth every 8 (eight) hours as needed for moderate pain.   ALBUTEROL (VENTOLIN HFA) 108 (90 BASE) MCG/ACT INHALER    Inhale 1 puff into the lungs daily as needed for shortness of breath.   AMLODIPINE (NORVASC) 2.5  MG TABLET    Take 1 tablet (2.5 mg total) by mouth daily.   BUSPIRONE (BUSPAR) 10 MG TABLET    Take 2 tablets (20 mg total) by mouth 3 (three) times daily.   CLOPIDOGREL (PLAVIX) 75 MG TABLET    Take 75 mg by mouth daily.   DIVALPROEX (DEPAKOTE) 125 MG DR TABLET    Take 2  tablets (250 mg total) by mouth 2 (two) times daily.   FUROSEMIDE (LASIX) 20 MG TABLET    Take 20 mg by mouth every other day.   GABAPENTIN (NEURONTIN) 100 MG CAPSULE    Take 1 capsule (100 mg total) by mouth 3 (three) times daily.   MUPIROCIN OINTMENT (BACTROBAN) 2 %    Place 1 Application into the nose 2 (two) times daily.   NYSTATIN (MYCOSTATIN/NYSTOP) POWDER    Apply 1 Application topically 3 (three) times daily. Under the breast   NYSTATIN-TRIAMCINOLONE OINTMENT (MYCOLOG)    Apply 1 Application topically 2 (two) times daily. Under the breasts   PANTOPRAZOLE (PROTONIX) 40 MG TABLET    Take 1 tablet (40 mg total) by mouth 2 (two) times daily.   PROPYL GLYCOL-HYDROXYETHYLCELL (NASAL MOIST) GEL    Apply after using nasal saline spray   SODIUM CHLORIDE (OCEAN) 0.65 % SOLN NASAL SPRAY    Use twice daily to keep nose moist.   SUCRALFATE (CARAFATE) 1 G TABLET    Take 1 tablet (1 g total) by mouth 4 (four) times daily -  with meals and at bedtime. Crush into powder, add water, and drink when taking   TRAMADOL (ULTRAM) 50 MG TABLET    Take 1 tablet (50 mg total) by mouth 3 (three) times daily as needed for moderate pain.  Modified Medications   No medications on file  Discontinued Medications   No medications on file    Past Medical History Past Medical History:  Diagnosis Date   Abscess 05/03/2022   Acute kidney injury superimposed on chronic kidney disease (HCC) 09/06/2019   Anxiety    Arthritis    RA   Carpal tunnel syndrome, bilateral    Cellulitis 05/01/2022   Chronic kidney disease    CKD stage III, absence of left kidney   CKD (chronic kidney disease) stage 3, GFR 30-59 ml/min (HCC) 09/06/2019   Congenital absence of one kidney    Pt has right kidney only   Depression    Dyslipidemia 09/06/2019   Elevated d-dimer 10/23/2020   Essential hypertension 09/06/2019   GERD (gastroesophageal reflux disease)    Headache    Heart murmur    Homicidal ideation    Hypercholesteremia     Hypertension    Mammogram abnormal 10/08/2022   MRSA bacteremia 10/29/2020   Pain in left shoulder 12/13/2020   Peripheral vascular disease (HCC)    a. s/p R external iliac stent '06 with angioplasty '13 (Dr. Carollee Massed) b. 01/2016: s/p PTA/stenting in L common iliac artery (Dr. Allyson Sabal)   Pneumonia of right lower lobe due to infectious organism 11/11/2017   Polymyalgia rheumatica (HCC)    Psoriasis    Sepsis (HCC) 07/22/2022   Sepsis due to pneumonia (HCC) 11/21/2020   Sepsis with acute hypoxic respiratory failure (HCC) 09/01/2020   Shortness of breath 10/23/2020   Sleep apnea    Tremor 09/06/2019    Past Surgical History Past Surgical History:  Procedure Laterality Date   ABDOMINAL HYSTERECTOMY  1963   Partial,  Due to bleeding after delivery   ANTERIOR CERVICAL DECOMP/DISCECTOMY FUSION  04/19/2012   Procedure: ANTERIOR CERVICAL DECOMPRESSION/DISCECTOMY FUSION 2 LEVELS;  Surgeon: Carmela Hurt, MD;  Location: [facility]URO ORS;  Service: Neurosurgery;  Laterality: N/A;  Cervical four-five,Cervical five-six anterior cervical decompression with fusion plating and bonegraft possible posterior cervical decompression   APPENDECTOMY     ARTERY BIOPSY Left 09/15/2022   Procedure: BIOPSY LEFT TEMPORAL ARTERY;  Surgeon: Nada Libman, MD;  Location: [facility] OR;  Service: Vascular;  Laterality: Left;   CARPAL TUNNEL RELEASE  1990   CERVICAL FUSION  04/19/2012   EYE SURGERY     ILIAC ARTERY STENT     PERIPHERAL VASCULAR CATHETERIZATION N/A 01/31/2016   Procedure: Abdominal Aortogram;  Surgeon: Runell Gess, MD;  Location: [facility] INVASIVE CV LAB;  Service: Cardiovascular;  Laterality: N/A;   PERIPHERAL VASCULAR CATHETERIZATION Bilateral 01/31/2016   Procedure: Lower Extremity Angiography;  Surgeon: Runell Gess, MD;  Location[facility] INVASIVE CV LAB;  Service: Cardiovascular;  Laterality: Bilateral;   PERIPHERAL VASCULAR CATHETERIZATION Left 01/31/2016   Procedure: Peripheral Vascular Intervention;  Surgeon:  Runell Gess, MD;  Location: [facility] INVASIVE CV LAB;  Service: Cardiovascular;  Laterality: Left;  ILIAC   TEE WITHOUT CARDIOVERSION N/A 11/01/2020   Procedure: TRANSESOPHAGEAL ECHOCARDIOGRAM (TEE);  Surgeon: Quintella Reichert, MD;  Location: [facility] ENDOSCOPY;  Service: Cardiovascular;  Laterality: N/A;   TEE WITHOUT CARDIOVERSION N/A 11/25/2020   Procedure: TRANSESOPHAGEAL ECHOCARDIOGRAM (TEE);  Surgeon: Quintella Reichert, MD;  Location: [facility] ENDOSCOPY;  Service: Cardiovascular;  Laterality: N/A;   TONSILLECTOMY      Family History family history includes Angina in her mother; Brain cancer in her sister; Cancer in her sister; Congestive Heart Failure in her brother and mother; Heart attack in her father; Heart disease in her brother; Melanoma in her brother; Neuropathy in her sister; Osteoarthritis in her daughter.  Social History Social History   Socioeconomic History   Marital status: Widowed    Spouse name: Not on file   Number of children: 1   Years of education: Not on file   Highest education level: Not on file  Occupational History   Occupation: Retired  Tobacco Use   Smoking status: Former    Current packs/day: 0.00    Average packs/day: 1 pack/day for 40.0 years (40.0 ttl pk-yrs)    Types: Cigarettes    Start date: 07/24/1970    Quit date: 07/24/2010    Years since quitting: 12.5   Smokeless tobacco: Never   Tobacco comments:    STARTED BACK JULY 2013 AND RECENTLY QUIT 03-10-2012  Vaping Use   Vaping status: Never Used  Substance and Sexual Activity   Alcohol use: Never    Comment: QUIT IN 1999   Drug use: Never   Sexual activity: Not Currently    Comment: 1st intercourse 82 yo-Fewer than 5 partners  Other Topics Concern   Not on file  Social History Narrative   ** Merged History Encounter **       Social Determinants of Health   Financial Resource Strain: Low Risk  (10/13/2022)   Overall Financial Resource Strain (CARDIA)    Difficulty of Paying Living Expenses: Not hard  at all  Food Insecurity: No Food Insecurity (10/13/2022)   Hunger Vital Sign    Worried About Running Out of Food in the Last Year: Never true    Ran Out of Food in the Last Year: Never true  Transportation Needs: No Transportation Needs (10/13/2022)   PRAPARE - Administrator, Civil Service (Medical):  No    Lack of Transportation (Non-Medical): No  Physical Activity: Insufficiently Active (10/13/2022)   Exercise Vital Sign    Days of Exercise per Week: 7 days    Minutes of Exercise per Session: 10 min  Stress: Stress Concern Present (10/13/2022)   [employer] of Occupational Health - Occupational Stress Questionnaire    Feeling of Stress : Very much  Social Connections: Moderately Integrated (10/13/2022)   Social Connection and Isolation Panel [NHANES]    Frequency of Communication with Friends and Family: More than three times a week    Frequency of Social Gatherings with Friends and Family: More than three times a week    Attends Religious Services: More than 4 times per year    Active Member of [employer] or Organizations: Yes    Attends Banker Meetings: Never    Marital Status: Widowed  Intimate Partner Violence: Not At Risk (05/02/2022)   Humiliation, Afraid, Rape, and Kick questionnaire    Fear of Current or Ex-Partner: No    Emotionally Abused: No    Physically Abused: No    Sexually Abused: No    Laboratory Investigations Lab Results  Component Value Date   TSH 2.56 12/13/2022   TSH 0.417 (L) 10/03/2022   TSH 1.960 11/28/2017   FREET4 1.0 12/13/2022   FREET4 0.8 08/01/2016     No results found for: "TSI"   No components found for: "TRAB"   Lab Results  Component Value Date   CHOL 212 (H) 10/03/2022   Lab Results  Component Value Date   HDL 111 10/03/2022   Lab Results  Component Value Date   LDLCALC 81 10/03/2022   Lab Results  Component Value Date   TRIG 118 10/03/2022   Lab Results  Component Value Date   CHOLHDL 1.9 10/03/2022    Lab Results  Component Value Date   CREATININE 0.95 10/03/2022   No results found for: "GFR"    Component Value Date/Time   NA 140 10/03/2022 1736   K 4.7 10/03/2022 1736   CL 103 10/03/2022 1736   CO2 22 10/03/2022 1736   GLUCOSE 81 10/03/2022 1736   GLUCOSE 139 (H) 09/16/2022 0124   BUN 41 (H) 10/03/2022 1736   CREATININE 0.95 10/03/2022 1736   CREATININE 1.07 (H) 08/01/2016 1512   CALCIUM 9.2 10/03/2022 1736   PROT 6.4 (L) 09/14/2022 0634   PROT 6.3 06/03/2019 1047   ALBUMIN 4.2 09/14/2022 0634   ALBUMIN 4.3 06/03/2019 1047   AST 16 09/14/2022 0634   ALT 9 09/14/2022 0634   ALKPHOS 62 09/14/2022 0634   BILITOT 0.9 09/14/2022 0634   BILITOT 0.5 06/03/2019 1047   GFRNONAA 48 (L) 09/16/2022 0124   GFRNONAA 50 (L) 08/01/2016 1512   GFRAA >60 09/08/2019 0133   GFRAA 58 (L) 08/01/2016 1512      Latest Ref Rng & Units 10/03/2022    5:36 PM 09/16/2022    1:24 AM 09/15/2022    1:25 AM  BMP  Glucose 70 - 99 mg/dL 81  332  86   BUN 8 - 27 mg/dL 41  16  11   Creatinine 0.57 - 1.00 mg/dL 9.51  8.84  1.66   BUN/Creat Ratio 12 - 28 43     Sodium 134 - 144 mmol/L 140  136  137   Potassium 3.5 - 5.2 mmol/L 4.7  4.2  3.1   Chloride 96 - 106 mmol/L 103  100  103   CO2 20 -  29 mmol/L 22  26  24    Calcium 8.7 - 10.3 mg/dL 9.2  9.2  9.0        Component Value Date/Time   WBC 6.3 10/23/2022 1648   RBC 3.49 (L) 10/23/2022 1648   HGB 12.0 10/23/2022 1648   HGB 13.9 10/03/2022 1736   HCT 37.0 10/23/2022 1648   HCT 40.3 10/03/2022 1736   PLT 239 10/23/2022 1648   PLT 176 10/03/2022 1736   MCV 106.0 (H) 10/23/2022 1648   MCV 102 (H) 10/03/2022 1736   MCH 34.4 (H) 10/23/2022 1648   MCHC 32.4 10/23/2022 1648   RDW 14.3 10/23/2022 1648   RDW 13.4 10/03/2022 1736   LYMPHSABS 1.5 09/16/2022 0124   LYMPHSABS 2.3 06/03/2019 1047   MONOABS 0.1 09/16/2022 0124   EOSABS 0.0 09/16/2022 0124   EOSABS 0.1 06/03/2019 1047   BASOSABS 0.0 09/16/2022 0124   BASOSABS 0.0 06/03/2019 1047       Parts of this note may have been dictated using voice recognition software. There may be variances in spelling and vocabulary which are unintentional. Not all errors are proofread. Please notify the Thereasa Parkin if any discrepancies are noted or if the meaning of any statement is not clear.

## 2023-02-12 ENCOUNTER — Ambulatory Visit (HOSPITAL_COMMUNITY)
Admission: RE | Admit: 2023-02-12 | Discharge: 2023-02-12 | Disposition: A | Discharge status: Home / Self Care | Payer: HMO | Source: Ambulatory Visit | Attending: "Endocrinology | Admitting: "Endocrinology

## 2023-02-12 DIAGNOSIS — E041 Nontoxic single thyroid nodule: Secondary | ICD-10-CM | POA: Diagnosis not present

## 2023-02-14 ENCOUNTER — Other Ambulatory Visit: Payer: Self-pay | Admitting: "Endocrinology

## 2023-02-14 DIAGNOSIS — E041 Nontoxic single thyroid nodule: Secondary | ICD-10-CM

## 2023-02-21 ENCOUNTER — Telehealth: Payer: Self-pay | Admitting: "Endocrinology

## 2023-02-21 NOTE — Telephone Encounter (Signed)
Patient called and advised she had an MRI and was told she needed a Biopsy patient states she just wanted to let Dr. Roosevelt Locks know what was going on.

## 2023-03-19 ENCOUNTER — Encounter: Payer: Self-pay | Admitting: Family Medicine

## 2023-03-19 ENCOUNTER — Ambulatory Visit (INDEPENDENT_AMBULATORY_CARE_PROVIDER_SITE_OTHER): Payer: HMO | Admitting: Family Medicine

## 2023-03-19 VITALS — BP 134/76 | HR 88 | Wt 156.0 lb

## 2023-03-19 DIAGNOSIS — F4321 Adjustment disorder with depressed mood: Secondary | ICD-10-CM | POA: Insufficient documentation

## 2023-03-19 DIAGNOSIS — L304 Erythema intertrigo: Secondary | ICD-10-CM

## 2023-03-19 DIAGNOSIS — I739 Peripheral vascular disease, unspecified: Secondary | ICD-10-CM

## 2023-03-19 DIAGNOSIS — L821 Other seborrheic keratosis: Secondary | ICD-10-CM

## 2023-03-19 DIAGNOSIS — R040 Epistaxis: Secondary | ICD-10-CM

## 2023-03-19 DIAGNOSIS — Z23 Encounter for immunization: Secondary | ICD-10-CM | POA: Diagnosis not present

## 2023-03-19 DIAGNOSIS — S81812A Laceration without foreign body, left lower leg, initial encounter: Secondary | ICD-10-CM | POA: Insufficient documentation

## 2023-03-19 DIAGNOSIS — E041 Nontoxic single thyroid nodule: Secondary | ICD-10-CM

## 2023-03-19 HISTORY — DX: Laceration without foreign body, left lower leg, initial encounter: S81.812A

## 2023-03-19 MED ORDER — NYSTATIN-TRIAMCINOLONE 100000-0.1 UNIT/GM-% EX OINT
1.0000 | TOPICAL_OINTMENT | Freq: Two times a day (BID) | CUTANEOUS | 3 refills | Status: DC
Start: 2023-03-19 — End: 2023-11-06

## 2023-03-19 MED ORDER — CLOPIDOGREL BISULFATE 75 MG PO TABS
75.0000 mg | ORAL_TABLET | Freq: Every day | ORAL | 3 refills | Status: DC
Start: 2023-03-19 — End: 2023-04-16

## 2023-03-19 MED ORDER — NYSTATIN 100000 UNIT/GM EX POWD
1.0000 | Freq: Three times a day (TID) | CUTANEOUS | 3 refills | Status: DC
Start: 2023-03-19 — End: 2023-11-06

## 2023-03-19 NOTE — Assessment & Plan Note (Signed)
Chart review shows that FNA has been ordered by endocrinology, however the patient was contacted about scheduling she did not follow through and wanted to speak to endocrinologist.  FNA should be scheduled as soon as possible and no reason to delay.  Patient advised to schedule soon. -Provided patient with endocrinology phone number of Dr. Roosevelt Locks [phone]) [phone]] and instructed to call or have her nurse call to schedule FNA

## 2023-03-19 NOTE — Assessment & Plan Note (Signed)
>>  ASSESSMENT AND PLAN FOR GRIEF WRITTEN ON 03/19/2023  5:24 PM BY SHITAREV, DIMITRY, MD  Patient grieving given loss of her children in the past and now rapidly worsening condition of her sole surviving daughter.  She has been through a lot and is in a very difficult position.  Believe her tearful affect, poor sleep, poor nutrition part of acute grief reaction.  However, it will be important to follow-up closely.  Her weight has remained stable over the past few months, no concern for anorexia this time. -Follow-up in 1 month for weight check and evaluation for depression

## 2023-03-19 NOTE — Assessment & Plan Note (Signed)
Multiple lesions of the left axilla consistent with seborrheic keratoses, they are symptomatic and providing patient discomfort. -Follow-up in skin clinic for cryotherapy

## 2023-03-19 NOTE — Assessment & Plan Note (Signed)
Fairly shallow laceration, however bleeding significantly given patient's use of clopidogrel and highly dry skin.  Will closely monitor healing given patient's history of PAD requiring stenting and likely poor lower extremity circulation. -Follow-up in skin clinic in 1 month with a repeat visit -Return precautions given in the event of pus drainage, increased bleeding, edema, redness, swelling -Covered bilateral lower extremities and Vaseline ointment, instructed patient not to use sticky tape or Band-Aids on papery skin in this area, gently wrapped in Kerlix

## 2023-03-19 NOTE — Assessment & Plan Note (Signed)
Patient grieving given loss of her children in the past and now rapidly worsening condition of her sole surviving daughter.  She has been through a lot and is in a very difficult position.  Believe her tearful affect, poor sleep, poor nutrition part of acute grief reaction.  However, it will be important to follow-up closely.  Her weight has remained stable over the past few months, no concern for anorexia this time. -Follow-up in 1 month for weight check and evaluation for depression

## 2023-03-19 NOTE — Assessment & Plan Note (Signed)
Given recent his epistaxis, will check hemoglobin today.  Low concern for acute anemia, however will use caution given patient's chronically use of clopidogrel and frequent bleeding. -f/u CBC -Advised patient to present to ED if experiencing profuse epistaxis that is unremitting -Recommended moisturizing opening of nares with Vaseline, using caution with nails

## 2023-03-19 NOTE — Progress Notes (Signed)
SUBJECTIVE:   CHIEF COMPLAINT / HPI:  [name] [name] is a 82 y.o. adult with a pertinent past medical history of LLE stents for PAD on clopidogrel, recurrent epistaxis requiring hospitalization previously, known thyroid nodule awaiting FNA, intertrigo under bilateral breasts, seborrheic keratosis presenting to the clinic for   RLE laceration On Wednesday 9/4, patient cut her left leg over her tibia on a sharp piece of cardboard.  Her leg bled significantly and she has had to go through a number of Band-Aids the past few days clean the wound.  Continues to bleed intermittently.  She does take clopidogrel given her extensive stents lower extremities.  Epistaxis Patient's nose has been bleeding "a lot," and she is sometimes unable to make it stop, does have a history of epistaxis requiring ED visits.  Last had epistaxis episode was this past Friday and lasted about 2.5 hours.  Seborrheic keratoses Has multiple "skin tags" under L arm that are causing her pain and itching.  She states 1 has grown a lot recently.  They keep getting caught on clothes and she has noticed surrounding bruising when this happens.  They are painful and itchy and causing her distress.  Insomnia, acute grief The patient is eating and sleeping poorly, states she only slept 2.5 hours last night.  Drinks some Boosts occasionally.  The anniversary of her son's death has recently passed.  Her sole surviving daughter currently has cirrhosis and is in critical condition in the ICU.  She is experiencing a lot of distress and sphenoid R.  Intertrigo Patient has known intertrigo under bilateral breasts, continues to itch and bother her intermittently.  Asks for refill today.  PERTINENT PMH / PSH: Daughter is in the ICU right now (cirrhosis, patient had to call 911 a few days ago when daughter came unresponsive). Patient lives alone, does have nursing care visit her few times throughout the week to assist with medications and  appointments. Patient is independent in ADLs, lives with rolling walker.  Appears to require assistance with some IADLs.   OBJECTIVE:   BP 134/76   Pulse 88   Wt 156 lb (70.8 kg)   SpO2 98%   BMI 31.51 kg/m   General: Frail elderly female, resting in chair, NAD, has profuse tremors, alert and at baseline. HEENT: Mildly dry mucous membranes.  No tenderness to percussion over sinuses.  No conjunctival pallor.  No crusting or bleeding from nares bilaterally.  Non-erythematous turbinates. No rhinorrhea. Cardiovascular: Regular rate and rhythm. Normal S1/S2. No murmurs, rubs, or gallops appreciated. 2+ radial pulses. Pulmonary: Clear bilaterally to ascultation. No increased WOB, no accessory muscle usage. No wheezes, crackles, or rhonchi. Skin: 5 cm laceration with associated purpuric skin flap overlying L tibia (see photo below).  Multiple yellowish seborrheic keratoses in L axilla, each 1 cm in diameter.  No surrounding erythema or bleeding lesions.  Dry, papery skin.  Multiple scattered senile purpura across extremities.   Extremities: No peripheral edema bilaterally. Significant venous stasis changes overlying bilateral LE.  Capillary refill 2-3 seconds.  DP and PT pulses 2+ in BLE, perfusing. Psych: A&Ox4.  Tearful affect, becomes tearful when discussing her daughter's health and her children who passed away.  Pleasant and appropriate.  Denies SI/HI.     ASSESSMENT/PLAN:   Epistaxis Given recent his epistaxis, will check hemoglobin today.  Low concern for acute anemia, however will use caution given patient's chronically use of clopidogrel and frequent bleeding. -f/u CBC -Advised patient to present to ED if experiencing  profuse epistaxis that is unremitting -Recommended moisturizing opening of nares with Vaseline, using caution with nails  Seborrheic keratosis Multiple lesions of the left axilla consistent with seborrheic keratoses, they are symptomatic and providing patient  discomfort. -Follow-up in skin clinic for cryotherapy  Laceration of left lower extremity Fairly shallow laceration, however bleeding significantly given patient's use of clopidogrel and highly dry skin.  Will closely monitor healing given patient's history of PAD requiring stenting and likely poor lower extremity circulation. -Follow-up in skin clinic in 1 month with a repeat visit -Return precautions given in the event of pus drainage, increased bleeding, edema, redness, swelling -Covered bilateral lower extremities and Vaseline ointment, instructed patient not to use sticky tape or Band-Aids on papery skin in this area, gently wrapped in Kerlix  Thyroid nodule Chart review shows that FNA has been ordered by endocrinology, however the patient was contacted about scheduling she did not follow through and wanted to speak to endocrinologist.  FNA should be scheduled as soon as possible and no reason to delay.  Patient advised to schedule soon. -Provided patient with endocrinology phone number of Dr. Roosevelt Locks [phone]) [phone]] and instructed to call or have her nurse call to schedule FNA  Grief Patient grieving given loss of her children in the past and now rapidly worsening condition of her sole surviving daughter.  She has been through a lot and is in a very difficult position.  Believe her tearful affect, poor sleep, poor nutrition part of acute grief reaction.  However, it will be important to follow-up closely.  Her weight has remained stable over the past few months, no concern for anorexia this time. -Follow-up in 1 month for weight check and evaluation for depression  Medication refills Refilled nystatin powder, nystatin ointment, (intertrigo), and clopidogrel (PAD, multiple stents).  Health maintenance Administered influenza vaccine. Deferred discussion of shingles vaccine to future visit.  Return in about 4 weeks (around 04/16/2023) to follow up on insomnia, AND will schedule second  appointment at next skin clinic for seborrheic keratoses removal.  Adarrius Graeff Sharion Dove, MD [facility]

## 2023-03-19 NOTE — Patient Instructions (Addendum)
It was great to see you today! Thank you for choosing [facility] for your primary care.  Today we addressed:  Left leg cut Please do NOT use bandaids or any tape on your skin.  Your skin is so thin that it will rip and tear if you use these.  Please cover the cut in Vaseline ointment and wrap gently with a wrap around your leg.  Please moisturize your skin on both legs with Vaseline like this.  If you notice swelling, increased pain, or puss draining from the cut, please get medical care immediately.  Left armpit skin tags: These are called seborrheic keratoses.  We will have you schedule at the front desk  Nosebleed We will check your blood count today just to make sure you have not lost too much blood.  Thyroid nodule Please call your Dr. Grafton Folk office to schedule that biopsy (fine needle aspiration) of your thyroid.  You can have your nurse call to schedule as well.  It has been ordered and just needs to be scheduled.  Phone number: [phone]  We are checking some labs today, including your blood count.  You will get a MyChart message or a letter if results are normal. Otherwise, you will get a call from [place].  You should return to our clinic in 1 month to talk more about sleep problems.  Thank you for coming to see [place] at [facility] and for the opportunity to care for you! Sharion Dove Arliss Hepburn, MD 03/19/2023, 2:41 PM

## 2023-03-20 ENCOUNTER — Telehealth: Payer: Self-pay | Admitting: "Endocrinology

## 2023-03-20 ENCOUNTER — Encounter: Payer: Self-pay | Admitting: "Endocrinology

## 2023-03-20 LAB — CBC
Hematocrit: 36.2 % (ref 34.0–46.6)
Hemoglobin: 12.7 g/dL (ref 11.1–15.9)
MCH: 35.4 pg — ABNORMAL HIGH (ref 26.6–33.0)
MCHC: 35.1 g/dL (ref 31.5–35.7)
MCV: 101 fL — ABNORMAL HIGH (ref 79–97)
Platelets: 179 10*3/uL (ref 150–450)
RBC: 3.59 x10E6/uL — ABNORMAL LOW (ref 3.77–5.28)
RDW: 13 % (ref 11.7–15.4)
WBC: 5.1 10*3/uL (ref 3.4–10.8)

## 2023-03-20 NOTE — Progress Notes (Signed)
Called patient, discussed labs.  No anemia, notified her of that fact.  Did note macrocytosis, will discuss vitamin supplementation versus further testing at follow up appointment at [facility] in 1 month, particularly in setting of poor PO intake lately.  Established that patient reached out to Endo about scheduling FNA and she is expecting call back soon about the date and time of appointment (apparently there was some scheduling issue before at Endo office).

## 2023-03-20 NOTE — Telephone Encounter (Signed)
Patient is asking why she hasn't gotten a call to have her Biopsy done. She want someone to call and give her an update

## 2023-03-20 NOTE — Telephone Encounter (Signed)
I called and spoke to [name] [name] I explained to her that I would fax her order over to Central Scheduling and have them give her a call to schedule her Bx

## 2023-03-21 ENCOUNTER — Other Ambulatory Visit: Payer: Self-pay

## 2023-03-22 MED ORDER — TRAMADOL HCL 50 MG PO TABS: 50.0000 mg | ORAL_TABLET | Freq: Three times a day (TID) | ORAL | 1 refills | Status: DC | PRN

## 2023-03-28 ENCOUNTER — Ambulatory Visit (INDEPENDENT_AMBULATORY_CARE_PROVIDER_SITE_OTHER): Payer: HMO

## 2023-03-28 ENCOUNTER — Other Ambulatory Visit: Payer: Self-pay | Admitting: Family Medicine

## 2023-03-28 VITALS — BP 102/70 | HR 108 | Temp 97.5°F | Ht 59.5 in | Wt 160.0 lb

## 2023-03-28 DIAGNOSIS — L821 Other seborrheic keratosis: Secondary | ICD-10-CM | POA: Diagnosis not present

## 2023-03-28 DIAGNOSIS — L989 Disorder of the skin and subcutaneous tissue, unspecified: Secondary | ICD-10-CM | POA: Diagnosis not present

## 2023-03-28 NOTE — Patient Instructions (Signed)
Please use vaseline or aquafor on the spots I froze on your back. If it forms a blister, do not pick at it. The blister will heal on it's own. If you have lots of bleeding, swelling, or otherwise looks like it is getting more red and warm, please come see [place] sooner.   I will follow up the other two spots on your arms next week.   Thank you,  Dr. Marsh Dolly

## 2023-03-28 NOTE — Progress Notes (Unsigned)
    SUBJECTIVE:   CHIEF COMPLAINT / HPI:   Itchy Bumps  Patient reported some itchy "skin tags" under bilateral axillas for the last month.  Also notes to on her back.  Says that they are itchy and get caught on her clothes irritating her.  However says that previous physician prescribed her a medication for intertrigo under her breasts that she applied to these areas that have greatly reduced the size of her "skin tags".  Says that the tags underneath her axilla was do not bother her anymore however she still has itching on the spots on her back.  No pain.  No bleeding.  They have not grown in size.  Patient does note history of basal cell carcinoma that was removed from her scalp.  No history of melanoma.  PERTINENT  PMH / PSH: Basal cells status post removal, on anticoagulation  OBJECTIVE:   BP 102/70   Pulse (!) 108   Temp (!) 97.5 F (36.4 C)   Ht 4' 11.5" (1.511 m)   Wt 160 lb (72.6 kg)   SpO2 95%   BMI 31.78 kg/m   General: well appearing, in no acute distress CV: RRR, radial pulses equal and palpable Resp: Normal work of breathing on room air Neuro: Alert & Oriented x 4  Skin: Various bruises on arms and freckles, on right arm erythematous 3 cm area with visible veins slightly shiny appearance.  No pain to palpation.  Under bilateral axilla as scaly raised skin colored or slightly hypopigmented papule largest about 1-1/2 cm.  No tenderness to palpation no lymphadenopathy.  On patient's back bilaterally to to 5 seborrheic keratoses hypopigmented 1 cm lesions that are scaly pruritic nontender.       ASSESSMENT/PLAN:   Assessment & Plan Seborrheic keratosis Patient with seborrheic keratoses in both axillas on back and on other areas of the trunk.  No signs for concern. Anti inflammatory property of mycolog could have been improving seborrheic keratosis in axillas. Will continue to monitor those.  - Cryotherapy of seborrheic lesions on back as those were bothering patient,         Lockie Mola, MD [facility]

## 2023-03-30 NOTE — Assessment & Plan Note (Addendum)
Patient with seborrheic keratoses in both axillas on back and on other areas of the trunk.  No signs for concern. Anti inflammatory property of mycolog could have been improving seborrheic keratosis in axillas. Will continue to monitor those.  - Cryotherapy of seborrheic lesions on back as those were bothering patient,

## 2023-04-01 ENCOUNTER — Emergency Department (HOSPITAL_COMMUNITY): Payer: HMO

## 2023-04-01 ENCOUNTER — Other Ambulatory Visit: Payer: Self-pay

## 2023-04-01 ENCOUNTER — Emergency Department (HOSPITAL_COMMUNITY)
Admission: EM | Admit: 2023-04-01 | Discharge: 2023-04-01 | Disposition: A | Discharge status: Home / Self Care | Payer: HMO | Attending: Emergency Medicine | Admitting: Emergency Medicine

## 2023-04-01 DIAGNOSIS — J40 Bronchitis, not specified as acute or chronic: Secondary | ICD-10-CM

## 2023-04-01 DIAGNOSIS — G319 Degenerative disease of nervous system, unspecified: Secondary | ICD-10-CM | POA: Diagnosis not present

## 2023-04-01 DIAGNOSIS — R509 Fever, unspecified: Secondary | ICD-10-CM | POA: Insufficient documentation

## 2023-04-01 DIAGNOSIS — Z7901 Long term (current) use of anticoagulants: Secondary | ICD-10-CM | POA: Diagnosis not present

## 2023-04-01 DIAGNOSIS — I6782 Cerebral ischemia: Secondary | ICD-10-CM | POA: Insufficient documentation

## 2023-04-01 DIAGNOSIS — W19XXXA Unspecified fall, initial encounter: Secondary | ICD-10-CM | POA: Diagnosis not present

## 2023-04-01 DIAGNOSIS — Z1152 Encounter for screening for COVID-19: Secondary | ICD-10-CM | POA: Insufficient documentation

## 2023-04-01 DIAGNOSIS — M542 Cervicalgia: Secondary | ICD-10-CM | POA: Diagnosis present

## 2023-04-01 LAB — COMPREHENSIVE METABOLIC PANEL
ALT: 11 U/L (ref 0–44)
AST: 26 U/L (ref 15–41)
Albumin: 3.8 g/dL (ref 3.5–5.0)
Alkaline Phosphatase: 56 U/L (ref 38–126)
Anion gap: 14 (ref 5–15)
BUN: 25 mg/dL — ABNORMAL HIGH (ref 8–23)
CO2: 20 mmol/L — ABNORMAL LOW (ref 22–32)
Calcium: 9.2 mg/dL (ref 8.9–10.3)
Chloride: 103 mmol/L (ref 98–111)
Creatinine, Ser: 1.23 mg/dL — ABNORMAL HIGH (ref 0.44–1.00)
GFR, Estimated: 44 mL/min — ABNORMAL LOW (ref >60)
Glucose, Bld: 74 mg/dL (ref 70–99)
Potassium: 3.9 mmol/L (ref 3.5–5.1)
Sodium: 137 mmol/L (ref 135–145)
Total Bilirubin: 0.7 mg/dL (ref 0.3–1.2)
Total Protein: 6.5 g/dL (ref 6.5–8.1)

## 2023-04-01 LAB — PROTIME-INR
INR: 1 (ref 0.8–1.2)
Prothrombin Time: 13.2 seconds (ref 11.4–15.2)

## 2023-04-01 LAB — URINALYSIS, W/ REFLEX TO CULTURE (INFECTION SUSPECTED)
Bacteria, UA: NONE SEEN
Bilirubin Urine: NEGATIVE
Glucose, UA: NEGATIVE mg/dL
Hgb urine dipstick: NEGATIVE
Ketones, ur: 5 mg/dL — AB
Leukocytes,Ua: NEGATIVE
Nitrite: NEGATIVE
Protein, ur: NEGATIVE mg/dL
Specific Gravity, Urine: 1.006 (ref 1.005–1.030)
pH: 6 (ref 5.0–8.0)

## 2023-04-01 LAB — CBC WITH DIFFERENTIAL/PLATELET
Abs Immature Granulocytes: 0.03 10*3/uL (ref 0.00–0.07)
Basophils Absolute: 0 10*3/uL (ref 0.0–0.1)
Basophils Relative: 1 %
Eosinophils Absolute: 0.1 10*3/uL (ref 0.0–0.5)
Eosinophils Relative: 2 %
HCT: 35.1 % — ABNORMAL LOW (ref 36.0–46.0)
Hemoglobin: 11.9 g/dL — ABNORMAL LOW (ref 12.0–15.0)
Immature Granulocytes: 1 %
Lymphocytes Relative: 47 %
Lymphs Abs: 1.9 10*3/uL (ref 0.7–4.0)
MCH: 34.6 pg — ABNORMAL HIGH (ref 26.0–34.0)
MCHC: 33.9 g/dL (ref 30.0–36.0)
MCV: 102 fL — ABNORMAL HIGH (ref 80.0–100.0)
Monocytes Absolute: 0.4 10*3/uL (ref 0.1–1.0)
Monocytes Relative: 11 %
Neutro Abs: 1.5 10*3/uL — ABNORMAL LOW (ref 1.7–7.7)
Neutrophils Relative %: 38 %
Platelets: 73 10*3/uL — ABNORMAL LOW (ref 150–400)
RBC: 3.44 MIL/uL — ABNORMAL LOW (ref 3.87–5.11)
RDW: 14 % (ref 11.5–15.5)
Smear Review: DECREASED
WBC: 3.9 10*3/uL — ABNORMAL LOW (ref 4.0–10.5)
nRBC: 0.5 % — ABNORMAL HIGH (ref 0.0–0.2)

## 2023-04-01 LAB — I-STAT CG4 LACTIC ACID, ED: Lactic Acid, Venous: 0.9 mmol/L (ref 0.5–1.9)

## 2023-04-01 LAB — SARS CORONAVIRUS 2 BY RT PCR: SARS Coronavirus 2 by RT PCR: NEGATIVE

## 2023-04-01 LAB — APTT: aPTT: 23 seconds — ABNORMAL LOW (ref 24–36)

## 2023-04-01 LAB — CK: Total CK: 50 U/L (ref 38–234)

## 2023-04-01 MED ORDER — ACETAMINOPHEN 500 MG PO TABS
1000.0000 mg | ORAL_TABLET | Freq: Once | ORAL | Status: AC
  Administered 2023-04-01: 1000 mg via ORAL
  Filled 2023-04-01: qty 2

## 2023-04-01 MED ORDER — AMOXICILLIN-POT CLAVULANATE 500-125 MG PO TABS: 1.0000 | ORAL_TABLET | Freq: Two times a day (BID) | ORAL | 0 refills | Status: AC

## 2023-04-01 MED ORDER — AZITHROMYCIN 250 MG PO TABS: 250.0000 mg | ORAL_TABLET | Freq: Every day | ORAL | 0 refills | Status: DC

## 2023-04-01 MED ORDER — LACTATED RINGERS IV BOLUS
1000.0000 mL | Freq: Once | INTRAVENOUS | Status: AC
  Administered 2023-04-01: 1000 mL via INTRAVENOUS

## 2023-04-01 NOTE — Discharge Instructions (Addendum)
Thank you for coming to [facility] Emergency Department. You were seen for fall and fever. We did an exam, labs, and imaging, and these showed bronchitis. We will treat you with augmentin twice per day for 7 days and azithromycin for 5 days. You can take over the counter medicines such as mucinex for cough.  Please follow up with your primary care provider on Thursday as previously scheduled.   Do not hesitate to return to the ED or call 911 if you experience: -Worsening symptoms -Shortness of breath -Chest pain -Another fall -Lightheadedness, passing out -Fevers/chills -Anything else that concerns you

## 2023-04-01 NOTE — Progress Notes (Signed)
   04/01/23 0703  Spiritual Encounters  Type of Visit Initial  Care provided to: Patient  Conversation partners present during encounter Nurse  Referral source Trauma page  Reason for visit Trauma  OnCall Visit Yes   Responded to trauma page. Patient wanted her grandson notified but not her daughter. Called her grandson and put him on speaker phone to talk to patient and Rns Tyler Deis [phone], grandson does not live locally.

## 2023-04-01 NOTE — ED Provider Notes (Signed)
[place] EMERGENCY DEPARTMENT AT [facility] Provider Note   CSN: [id number] Arrival date & time: 04/01/23  7829     History  Chief Complaint  Patient presents with   Trauma    [name] [name] is a 82 y.o. adult.  Brought to the emergency department by ambulance from home.  Patient had a fall sometime tonight and could not get up.  She thinks she did pass out.  Patient reports that she knows that she feels like she is confused, similar to when she had a bad UTI in the past.  EMS report that she was febrile to 102, gave Tylenol.  Patient complaining of mild neck pain, reports that she laid on the ground for a number of hours and thinks that she "laid crooked".       Home Medications Prior to Admission medications   Medication Sig Start Date End Date Taking? Authorizing Provider  acetaminophen (TYLENOL) 500 MG tablet Take 500 mg by mouth every 8 (eight) hours as needed for moderate pain.    [provider]  albuterol (VENTOLIN HFA) 108 (90 Base) MCG/ACT inhaler Inhale 1 puff into the lungs daily as needed for shortness of breath. 09/01/22   [provider]  amLODipine (NORVASC) 2.5 MG tablet Take 1 tablet (2.5 mg total) by mouth daily. 11/05/20   Lanae Boast, MD  busPIRone (BUSPAR) 10 MG tablet TAKE TWO TABLETS BY MOUTH THREE TIMES A DAY 03/29/23   Glendale Chard, DO  clopidogrel (PLAVIX) 75 MG tablet Take 1 tablet (75 mg total) by mouth daily. 03/19/23   Shitarev, Dimitry, MD  divalproex (DEPAKOTE) 125 MG DR tablet Take 2 tablets (250 mg total) by mouth 2 (two) times daily. 12/31/22   Valetta Close, MD  furosemide (LASIX) 20 MG tablet Take 20 mg by mouth every other day. 07/17/22   [provider]  gabapentin (NEURONTIN) 100 MG capsule Take 1 capsule (100 mg total) by mouth 3 (three) times daily. 12/31/22   Valetta Close, MD  mupirocin ointment (BACTROBAN) 2 % Place 1 Application into the nose 2 (two) times daily. 09/16/22   Berton Mount I, MD   nystatin (MYCOSTATIN/NYSTOP) powder Apply 1 Application topically 3 (three) times daily. Under the breast 03/19/23   Shitarev, Dimitry, MD  nystatin-triamcinolone ointment (MYCOLOG) Apply 1 Application topically 2 (two) times daily. Under the breasts 03/19/23   Shitarev, Dimitry, MD  pantoprazole (PROTONIX) 40 MG tablet Take 1 tablet (40 mg total) by mouth 2 (two) times daily. 01/23/23   Glendale Chard, DO  Propyl Glycol-Hydroxyethylcell (NASAL MOIST) GEL Apply after using nasal saline spray 10/30/22   Valetta Close, MD  sodium chloride (OCEAN) 0.65 % SOLN nasal spray Use twice daily to keep nose moist. Patient not taking: Reported on 03/19/2023 10/30/22   Valetta Close, MD  sucralfate (CARAFATE) 1 g tablet Take 1 tablet (1 g total) by mouth 4 (four) times daily -  with meals and at bedtime. Crush into powder, add water, and drink when taking 12/08/22   Bess Kinds, MD  traMADol (ULTRAM) 50 MG tablet Take 1 tablet (50 mg total) by mouth 3 (three) times daily as needed for moderate pain. 03/22/23   Glendale Chard, DO      Allergies    Statins and Statins    Review of Systems   Review of Systems  Physical Exam Updated Vital Signs BP 112/72 (BP Location: Right Arm)   Pulse 88   Temp 100.1 F (37.8 C)  Resp (!) 26   Ht 4\' 11"  (1.499 m)   Wt 72 kg   BMI 32.06 kg/m  Physical Exam Vitals and nursing note reviewed.  Constitutional:      General: She is not in acute distress.    Appearance: She is well-developed.  HENT:     Head: Normocephalic and atraumatic.     Mouth/Throat:     Mouth: Mucous membranes are moist.  Eyes:     General: Vision grossly intact. Gaze aligned appropriately.     Extraocular Movements: Extraocular movements intact.     Conjunctiva/sclera: Conjunctivae normal.  Cardiovascular:     Rate and Rhythm: Regular rhythm. Tachycardia present.     Pulses: Normal pulses.     Heart sounds: Normal heart sounds, S1 normal and S2 normal. No murmur heard.    No friction  rub. No gallop.  Pulmonary:     Effort: Pulmonary effort is normal. No respiratory distress.     Breath sounds: Normal breath sounds.  Abdominal:     General: Bowel sounds are normal.     Palpations: Abdomen is soft.     Tenderness: There is no abdominal tenderness. There is no guarding or rebound.     Hernia: No hernia is present.  Musculoskeletal:        General: No swelling.     Cervical back: Full passive range of motion without pain, normal range of motion and neck supple. No spinous process tenderness or muscular tenderness. Normal range of motion.     Right hip: Normal.     Left hip: Normal.     Right lower leg: No edema.     Left lower leg: No edema.  Skin:    General: Skin is warm and dry.     Capillary Refill: Capillary refill takes less than 2 seconds.     Findings: No ecchymosis, erythema, rash or wound.  Neurological:     General: No focal deficit present.     Mental Status: She is alert and oriented to person, place, and time.     GCS: GCS eye subscore is 4. GCS verbal subscore is 5. GCS motor subscore is 6.     Cranial Nerves: Cranial nerves 2-12 are intact.     Sensory: Sensation is intact.     Motor: Motor function is intact.     Coordination: Coordination is intact.  Psychiatric:        Attention and Perception: Attention normal.        Mood and Affect: Mood normal.        Speech: Speech normal.        Behavior: Behavior normal.     ED Results / Procedures / Treatments   Labs (all labs ordered are listed, but only abnormal results are displayed) Labs Reviewed  CULTURE, BLOOD (ROUTINE X 2)  CULTURE, BLOOD (ROUTINE X 2)  COMPREHENSIVE METABOLIC PANEL  CBC WITH DIFFERENTIAL/PLATELET  PROTIME-INR  APTT  URINALYSIS, W/ REFLEX TO CULTURE (INFECTION SUSPECTED)  CBC WITH DIFFERENTIAL/PLATELET  CK  I-STAT CG4 LACTIC ACID, ED    EKG EKG Interpretation Date/Time:  Sunday April 01 2023 06:45:11 EDT Ventricular Rate:  109 PR Interval:    QRS  Duration:  96 QT Interval:  378 QTC Calculation: 445 R Axis:   74  Text Interpretation: Sinus tachycardia Abnormal R-wave progression, early transition Confirmed by Gilda Crease [phone]) on 04/01/2023 6:48:38 AM  Radiology No results found.  Procedures Procedures    Medications Ordered in ED Medications  lactated ringers bolus 1,000 mL (has no administration in time range)    ED Course/ Medical Decision Making/ A&P                                 Medical Decision Making Amount and/or Complexity of Data Reviewed Labs: ordered. Radiology: ordered. ECG/medicine tests: ordered.   Presented to the emergency department as a fall.  Patient arrives as a level 2 trauma because she is on blood thinners.  It is apparent that this is more medical presentation.  She did have some type of fall earlier tonight and then laid on the ground through the night.  Patient febrile, mildly tachycardic at arrival.  No signs of shock.  Possible sepsis workup initiated.  CT head and cervical spine also will be performed.  Case signed out at shift change, lab work and imaging pending.        Final Clinical Impression(s) / ED Diagnoses Final diagnoses:  Fall, initial encounter  Fever, unspecified fever cause    Rx / DC Orders ED Discharge Orders     None         Kearney Evitt, Canary Brim, MD 04/01/23 [phone]

## 2023-04-01 NOTE — ED Provider Notes (Signed)
1:52 PM Assumed care of patient from off-going team. For more details, please see note from same day.  In brief, this is a 82 y.o. adult with fall and confusion. Laid on floor all night, thinks she did pass out. Similar confusion sxs when she had a UTI in the past. Prehospital T 102F, got tylenol by EMS, now 100.8F here.   Plan/Dispo at time of sign-out & ED Course since sign-out: [ ]  CTH, C-spine, CXR, sepsis w/u, fluids  BP (!) 142/75   Pulse 76   Temp (!) 97.2 F (36.2 C) (Temporal)   Resp 18   Ht 4\' 11"  (1.499 m)   Wt 72 kg   SpO2 93%   BMI 32.06 kg/m    ED Course:   Clinical Course as of 04/01/23 1352  Sun Apr 01, 2023  0749 DG Chest Port 1 View +bronchitis on CXR. Lungs CTAB on exam.  [HN]  0749 CT HEAD WO CONTRAST ( ) CTH, CT C-spine clear [HN]  0946 Urinalysis, w/ Reflex to Culture (Infection Suspected) -Urine, Clean Catch(!) Neg for UTI [HN]  0946 CK Total: 50 No rhabdo [HN]  0948 Platelets(!): 73 +thrombocytopenia, which had improved for a few months but is chronic for the patient [HN]  1038 SARS Coronavirus 2 by RT PCR: NEGATIVE Negative covid [HN]  1038 Lactic Acid, Venous: 0.9 wnl [HN]  1313 D/w patient. She is A&Ox4 at this time. She has no significant complaints. She has reported a mild cough a fever yesterday to 102F at home. She has mild leukopenia and had fever, does meet SIRS criteria and had blood cultures drawn. Will treat her with augmentin/azithromycin and DC w/ close o/p f/u. She has PCP appt on Thursday and is supposed to see GI on Friday. She states she has her health aide coming tomorrow and her grandson will be picking her up from the hospital today, he will stay with her overnight tonight at her house. She states she feels safe going home. D/w patient about admission, performed shared decision making, and she states she would prefer to be discharged. She has no resp distress, no hypoxia. Instructed to take tylenol for headache and fever. Given  extensive return precautions, discharge instructions. All questions answered to patient's satisfaction. [HN]    Clinical Course User Index [HN] Loetta Rough, MD    Dispo: DC ------------------------------- Vivi Barrack, MD Emergency Medicine  This note was created using dictation software, which may contain spelling or grammatical errors.   Loetta Rough, MD 04/01/23 1352

## 2023-04-01 NOTE — ED Triage Notes (Addendum)
Presents from home for fall on thinners.  EMS arrived to patient sitting in recliner and then reporting to EMS that she believes she has a UTI. Reports of multiple syncopal episodes, witnessed by EMS. Transported by BLS truck, so no rhythm from transport.   Pt may have been on ground 12a to 5a.    EMS vitals: 110/62, 70-120bpm, RR26, 98CBG, 102.73F.   Received 1G tylenol en route

## 2023-04-04 ENCOUNTER — Encounter: Payer: Self-pay | Admitting: Gastroenterology

## 2023-04-04 ENCOUNTER — Ambulatory Visit (INDEPENDENT_AMBULATORY_CARE_PROVIDER_SITE_OTHER): Payer: HMO | Admitting: Gastroenterology

## 2023-04-04 ENCOUNTER — Ambulatory Visit: Payer: HMO | Admitting: Family Medicine

## 2023-04-04 ENCOUNTER — Encounter: Payer: Self-pay | Admitting: Family Medicine

## 2023-04-04 VITALS — BP 130/80 | HR 77 | Ht 59.0 in | Wt 140.0 lb

## 2023-04-04 VITALS — BP 176/74 | HR 73 | Temp 97.9°F | Ht 59.0 in | Wt 154.8 lb

## 2023-04-04 DIAGNOSIS — Z7902 Long term (current) use of antithrombotics/antiplatelets: Secondary | ICD-10-CM | POA: Diagnosis not present

## 2023-04-04 DIAGNOSIS — R1319 Other dysphagia: Secondary | ICD-10-CM

## 2023-04-04 DIAGNOSIS — J209 Acute bronchitis, unspecified: Secondary | ICD-10-CM

## 2023-04-04 DIAGNOSIS — R634 Abnormal weight loss: Secondary | ICD-10-CM | POA: Diagnosis not present

## 2023-04-04 DIAGNOSIS — I10 Essential (primary) hypertension: Secondary | ICD-10-CM | POA: Diagnosis not present

## 2023-04-04 DIAGNOSIS — R1013 Epigastric pain: Secondary | ICD-10-CM | POA: Diagnosis not present

## 2023-04-04 DIAGNOSIS — L989 Disorder of the skin and subcutaneous tissue, unspecified: Secondary | ICD-10-CM

## 2023-04-04 NOTE — Progress Notes (Deleted)
[facility] Consult Note:  History: [name] [name] 04/04/2023  Referring provider: Glendale Chard, DO  Reason for consult/chief complaint: Gastroesophageal Reflux (Pt is having problems with acid reflux, pt states she has a growth on her thyroid, pt is having acid that running back up in her mouth, pt been having problems for 3 months) and Dysphagia (Pt states she is having problems swallowing solid and liquid, burping causes food and drink to run back up her throat into her mouth)   Subjective  HPI:  ***   ROS:  Review of Systems   Past Medical History: Past Medical History:  Diagnosis Date   Abscess 05/03/2022   Acute kidney injury superimposed on chronic kidney disease (HCC) 09/06/2019   Anxiety    Arthritis    RA   Carpal tunnel syndrome, bilateral    Cellulitis 05/01/2022   Chronic kidney disease    CKD stage III, absence of left kidney   CKD (chronic kidney disease) stage 3, GFR 30-59 ml/min (HCC) 09/06/2019   Congenital absence of one kidney    Pt has right kidney only   Depression    Dyslipidemia 09/06/2019   Elevated d-dimer 10/23/2020   Essential hypertension 09/06/2019   GERD (gastroesophageal reflux disease)    Headache    Heart murmur    Homicidal ideation    Hypercholesteremia    Hypertension    Mammogram abnormal 10/08/2022   MRSA bacteremia 10/29/2020   Pain in left shoulder 12/13/2020   Peripheral vascular disease (HCC)    a. s/p R external iliac stent '06 with angioplasty '13 (Dr. Carollee Massed) b. 01/2016: s/p PTA/stenting in L common iliac artery (Dr. Allyson Sabal)   Pneumonia of right lower lobe due to infectious organism 11/11/2017   Polymyalgia rheumatica (HCC)    Psoriasis    Sepsis (HCC) 07/22/2022   Sepsis due to pneumonia (HCC) 11/21/2020   Sepsis with acute hypoxic respiratory failure (HCC) 09/01/2020   Shortness of breath 10/23/2020   Sleep apnea    Tremor 09/06/2019     Past Surgical History: Past Surgical History:   Procedure Laterality Date   ABDOMINAL HYSTERECTOMY  1963   Partial,  Due to bleeding after delivery   ANTERIOR CERVICAL DECOMP/DISCECTOMY FUSION  04/19/2012   Procedure: ANTERIOR CERVICAL DECOMPRESSION/DISCECTOMY FUSION 2 LEVELS;  Surgeon: Carmela Hurt, MD;  Location: [facility];  Service: Neurosurgery;  Laterality: N/A;  Cervical four-five,Cervical five-six anterior cervical decompression with fusion plating and bonegraft possible posterior cervical decompression   APPENDECTOMY     ARTERY BIOPSY Left 09/15/2022   Procedure: BIOPSY LEFT TEMPORAL ARTERY;  Surgeon: Nada Libman, MD;  Location: [facility] OR;  Service: Vascular;  Laterality: Left;   CARPAL TUNNEL RELEASE  1990   CERVICAL FUSION  04/19/2012   EYE SURGERY     ILIAC ARTERY STENT     PERIPHERAL VASCULAR CATHETERIZATION N/A 01/31/2016   Procedure: Abdominal Aortogram;  Surgeon: Runell Gess, MD;  Location: [facility]AS[facility] CV LAB;  Service: Cardiovascular;  Laterality: N/A;   PERIPHERAL VASCULAR CATHETERIZATION Bilateral 01/31/2016   Procedure: Lower Extremity Angiography;  Surgeon: Runell Gess, MD;  Location: [facility] INVASIVE CV LAB;  Service: Cardiovascular;  Laterality: Bilateral;   PERIPHERAL VASCULAR CATHETERIZATION Left 01/31/2016   Procedure: Peripheral Vascular Intervention;  Surgeon: Runell Gess, MD;  Location: [facility] INVASIVE CV LAB;  Service: Cardiovascular;  Laterality: Left;  ILIAC   TEE WITHOUT CARDIOVERSION N/A 11/01/2020   Procedure: TRANSESOPHAGEAL ECHOCARDIOGRAM (TEE);  Surgeon: Quintella Reichert, MD;  Location: [facility] ENDOSCOPY;  Service: Cardiovascular;  Laterality: N/A;   TEE WITHOUT CARDIOVERSION N/A 11/25/2020   Procedure: TRANSESOPHAGEAL ECHOCARDIOGRAM (TEE);  Surgeon: Quintella Reichert, MD;  Location: [facility] ENDOSCOPY;  Service: Cardiovascular;  Laterality: N/A;   TONSILLECTOMY       Family History: Family History  Problem Relation Age of Onset   Angina Mother    Congestive Heart Failure Mother    Heart attack Father     Cancer Sister        Lymphoma   Neuropathy Sister    Brain cancer Sister    Melanoma Brother    Congestive Heart Failure Brother    Heart disease Brother    Osteoarthritis Daughter    Colon cancer Neg Hx    Stomach cancer Neg Hx    Esophageal cancer Neg Hx     Social History: Social History   Socioeconomic History   Marital status: Widowed    Spouse name: Not on file   Number of children: 1   Years of education: Not on file   Highest education level: Not on file  Occupational History   Occupation: Retired  Tobacco Use   Smoking status: Former    Current packs/day: 0.00    Average packs/day: 1 pack/day for 40.0 years (40.0 ttl pk-yrs)    Types: Cigarettes    Start date: 07/24/1970    Quit date: 07/24/2010    Years since quitting: 12.7    Passive exposure: Past   Smokeless tobacco: Never   Tobacco comments:    STARTED BACK JULY 2013 AND RECENTLY QUIT 03-10-2012  Vaping Use   Vaping status: Never Used  Substance and Sexual Activity   Alcohol use: Never    Comment: QUIT IN 1999   Drug use: Never   Sexual activity: Not Currently    Comment: 1st intercourse 82 yo-Fewer than 5 partners  Other Topics Concern   Not on file  Social History Narrative   ** Merged History Encounter **       Social Determinants of Health   Financial Resource Strain: Low Risk  (10/13/2022)   Overall Financial Resource Strain (CARDIA)    Difficulty of Paying Living Expenses: Not hard at all  Food Insecurity: No Food Insecurity (10/13/2022)   Hunger Vital Sign    Worried About Running Out of Food in the Last Year: Never true    Ran Out of Food in the Last Year: Never true  Transportation Needs: No Transportation Needs (10/13/2022)   PRAPARE - Administrator, Civil Service (Medical): No    Lack of Transportation (Non-Medical): No  Physical Activity: Insufficiently Active (10/13/2022)   Exercise Vital Sign    Days of Exercise per Week: 7 days    Minutes of Exercise per Session: 10 min   Stress: Stress Concern Present (10/13/2022)   [employer] of Occupational Health - Occupational Stress Questionnaire    Feeling of Stress : Very much  Social Connections: Moderately Integrated (10/13/2022)   Social Connection and Isolation Panel [NHANES]    Frequency of Communication with Friends and Family: More than three times a week    Frequency of Social Gatherings with Friends and Family: More than three times a week    Attends Religious Services: More than 4 times per year    Active Member of [employer] or Organizations: Yes    Attends Banker Meetings: Never    Marital Status: Widowed    Allergies: Allergies  Allergen Reactions   Statins  Statins Swelling, Rash and Other (See Comments)    Swelling involving tongue; also causes muscle pain    Outpatient Meds: Current Outpatient Medications  Medication Sig Dispense Refill   acetaminophen (TYLENOL) 500 MG tablet Take 500 mg by mouth every 8 (eight) hours as needed for moderate pain.     albuterol (VENTOLIN HFA) 108 (90 Base) MCG/ACT inhaler Inhale 1 puff into the lungs daily as needed for shortness of breath.     amLODipine (NORVASC) 2.5 MG tablet Take 1 tablet (2.5 mg total) by mouth daily.     amoxicillin-clavulanate (AUGMENTIN) 500-125 MG tablet Take 1 tablet by mouth 2 (two) times daily for 7 days. 14 tablet 0   azithromycin (ZITHROMAX) 250 MG tablet Take 1 tablet (250 mg total) by mouth daily. Take first 2 tablets together, then 1 every day until finished. 6 tablet 0   busPIRone (BUSPAR) 10 MG tablet TAKE TWO TABLETS BY MOUTH THREE TIMES A DAY 180 tablet 2   clopidogrel (PLAVIX) 75 MG tablet Take 1 tablet (75 mg total) by mouth daily. 30 tablet 3   divalproex (DEPAKOTE) 125 MG DR tablet Take 2 tablets (250 mg total) by mouth 2 (two) times daily. 120 tablet 2   furosemide (LASIX) 20 MG tablet Take 20 mg by mouth every other day.     gabapentin (NEURONTIN) 100 MG capsule Take 1 capsule (100 mg total) by mouth 3  (three) times daily. 90 capsule 3   mupirocin ointment (BACTROBAN) 2 % Place 1 Application into the nose 2 (two) times daily. 22 g 0   nystatin (MYCOSTATIN/NYSTOP) powder Apply 1 Application topically 3 (three) times daily. Under the breast 120 g 3   nystatin-triamcinolone ointment (MYCOLOG) Apply 1 Application topically 2 (two) times daily. Under the breasts 60 g 3   pantoprazole (PROTONIX) 40 MG tablet Take 1 tablet (40 mg total) by mouth 2 (two) times daily. 180 tablet 1   Propyl Glycol-Hydroxyethylcell (NASAL MOIST) GEL Apply after using nasal saline spray 28.5 g 2   sodium chloride (OCEAN) 0.65 % SOLN nasal spray Use twice daily to keep nose moist. (Patient not taking: Reported on 03/19/2023) 104 mL 2   sucralfate (CARAFATE) 1 g tablet Take 1 tablet (1 g total) by mouth 4 (four) times daily -  with meals and at bedtime. Crush into powder, add water, and drink when taking 120 tablet 5   traMADol (ULTRAM) 50 MG tablet Take 1 tablet (50 mg total) by mouth 3 (three) times daily as needed for moderate pain. 90 tablet 1   No current facility-administered medications for this visit.      ___________________________________________________________________ Objective   Exam:  Ht 4\' 11"  (1.499 m)   Wt 140 lb (63.5 kg)   BMI 28.28 kg/m  Wt Readings from Last 3 Encounters:  04/04/23 140 lb (63.5 kg)  04/04/23 154 lb 12.8 oz (70.2 kg)  04/01/23 158 lb 11.7 oz (72 kg)    General: ***  Eyes: sclera anicteric, no redness ENT: oral mucosa moist without lesions, no cervical or supraclavicular lymphadenopathy CV: ***, no JVD, no peripheral edema Resp: clear to auscultation bilaterally, normal RR and effort noted GI: soft, *** tenderness, with active bowel sounds. No guarding or palpable organomegaly noted. Skin; warm and dry, no rash or jaundice noted Neuro: awake, alert and oriented x 3. Normal gross motor function and fluent speech  Labs:     Latest Ref Rng & Units 04/01/2023    6:52 AM  03/19/2023  4:56 PM 10/23/2022    4:48 PM  CBC  WBC 4.0 - 10.5 K/uL 3.9  5.1  6.3   Hemoglobin 12.0 - 15.0 g/dL 18.8  41.6  60.6   Hematocrit 36.0 - 46.0 % 35.1  36.2  37.0   Platelets 150 - 400 K/uL 73  179  239    Chronically high MCV     Latest Ref Rng & Units 04/01/2023    6:52 AM 10/03/2022    5:36 PM 09/16/2022    1:24 AM  CMP  Glucose 70 - 99 mg/dL 74  81  301   BUN 8 - 23 mg/dL 25  41  16   Creatinine 0.44 - 1.00 mg/dL 6.01  0.93  2.35   Sodium 135 - 145 mmol/L 137  140  136   Potassium 3.5 - 5.1 mmol/L 3.9  4.7  4.2   Chloride 98 - 111 mmol/L 103  103  100   CO2 22 - 32 mmol/L 20  22  26    Calcium 8.9 - 10.3 mg/dL 9.2  9.2  9.2   Total Protein 6.5 - 8.1 g/dL 6.5     Total Bilirubin 0.3 - 1.2 mg/dL 0.7     Alkaline Phos 38 - 126 U/L 56     AST 15 - 41 U/L 26     ALT 0 - 44 U/L 11      Last iron studies, B12 and folate normal in October 2023  COVID-negative in ED  [facility] urinalysis 04/01/2023  Radiologic Studies:  No acute bony injury on CT head or cervical spine ED visit 3 days ago.  Spine films show evidence of prior ACDF and degenerative changes.  _______________  Narrative & Impression  CLINICAL DATA:  82 year old female history of trauma from a fall. Fever. Possible sepsis.   EXAM: PORTABLE CHEST 1 VIEW   COMPARISON:  Chest x-ray 09/14/2022.   FINDINGS: Lung volumes are slightly low. Diffuse interstitial prominence and widespread peribronchial cuffing. No confluent consolidative airspace disease. No pleural effusions. No pneumothorax. Pulmonary vasculature does not appear engorged. Heart size is normal. The patient is rotated to the right on today's exam, resulting in distortion of the mediastinal contours and reduced diagnostic sensitivity and specificity for mediastinal pathology. Atherosclerotic calcifications are noted in the thoracic aorta.   IMPRESSION: 1. The appearance of the chest suggest an acute bronchitis. 2. Aortic atherosclerosis.      Electronically Signed   By: Trudie Reed M.D.   On: 04/01/2023 07:15    Assessment: No diagnosis found.  ***  Plan:  ***  Thank you for the courtesy of this consult.  Please call me with any questions or concerns.  Charlie Pitter III  CC: Referring provider noted above

## 2023-04-04 NOTE — Progress Notes (Signed)
[facility] Consult Note:  History: [name] [name] 04/04/2023  Referring provider: Glendale Chard, DO  Reason for consult/chief complaint: Gastroesophageal Reflux (Pt is having problems with acid reflux, pt states she has a growth on her thyroid, pt is having acid that running back up in her mouth, pt been having problems for 3 months) and Dysphagia (Pt states she is having problems swallowing solid and liquid, burping causes food and drink to run back up her throat into her mouth)   Subjective  HPI: [name] [name] was referred to [place] by primary care family practice clinic for chronic dysphagia and symptoms suggestive of GERD.  She is medically complex, has had acute issues recently with an ED visit 3 days ago as noted below.  She is also a tangential historian with limited health literacy.  This presented some diagnostic challenges today.  Today, she complains of reflux that has persisted for about 8 months. She states that she has a growth on her thyroid and is following up with a specialist to have a biopsy soon, and this appears to have been coordinated by her endocrinologist.. She is also experiencing accompanying food regurgitation along with dysphagia and early satiety despite having small meals, she get meals on wheels and states that the meals are typically small and she only eats half of it. She is also experiencing loss of appetite and as a result weight loss. These accompanying symptoms have persisted for the past 3 months.  It sounds like both the regurgitation and dysphagia have been worsening in the last few months.  She believes she has had some weight loss but is unable to quantify it.  Chart documentation suggest that she has been losing weight in recent months. We reviewed her recent ED visit for a fall and bronchitis and discussed her current medications  She is currently experiencing stress and is grieving over the passing of her daughter, the anniversary of her death  having, long recently. It also took me some time to figure out that she is no longer taking Carafate, and says it was stopped by her primary care clinic after first being prescribed in May.   Patient denies any diarrhea, constipation, nausea, blood in stool, black stool, vomiting, abdominal pain, or bloating.   ROS:  Review of Systems  Constitutional:  Positive for appetite change and unexpected weight change. Negative for fever.  HENT:  Positive for trouble swallowing.   Respiratory:  Negative for cough and shortness of breath.   Cardiovascular:  Negative for chest pain.  Gastrointestinal:  Negative for abdominal distention, abdominal pain, anal bleeding, blood in stool, constipation, diarrhea, nausea, rectal pain and vomiting.  Genitourinary:  Negative for dysuria.  Musculoskeletal:  Positive for arthralgias, back pain, myalgias and neck pain.  Skin:  Negative for rash.  Neurological:  Negative for weakness.  All other systems reviewed and are negative.    Past Medical History: Past Medical History:  Diagnosis Date   Abscess 05/03/2022   Acute kidney injury superimposed on chronic kidney disease (HCC) 09/06/2019   Anxiety    Arthritis    RA   Carpal tunnel syndrome, bilateral    Cellulitis 05/01/2022   Chronic kidney disease    CKD stage [name], absence of left kidney   CKD (chronic kidney disease) stage 3, GFR 30-59 ml/min (HCC) 09/06/2019   Congenital absence of one kidney    Pt has right kidney only   Depression    Dyslipidemia 09/06/2019   Elevated d-dimer 10/23/2020  Essential hypertension 09/06/2019   GERD (gastroesophageal reflux disease)    Headache    Heart murmur    Homicidal ideation    Hypercholesteremia    Hypertension    Mammogram abnormal 10/08/2022   MRSA bacteremia 10/29/2020   Pain in left shoulder 12/13/2020   Peripheral vascular disease (HCC)    a. s/p R external iliac stent '06 with angioplasty '13 (Dr. Carollee Massed) b. 01/2016: s/p PTA/stenting in L  common iliac artery (Dr. Allyson Sabal)   Pneumonia of right lower lobe due to infectious organism 11/11/2017   Polymyalgia rheumatica (HCC)    Psoriasis    Sepsis (HCC) 07/22/2022   Sepsis due to pneumonia (HCC) 11/21/2020   Sepsis with acute hypoxic respiratory failure (HCC) 09/01/2020   Shortness of breath 10/23/2020   Sleep apnea    Tremor 09/06/2019     Past Surgical History: Past Surgical History:  Procedure Laterality Date   ABDOMINAL HYSTERECTOMY  1963   Partial,  Due to bleeding after delivery   ANTERIOR CERVICAL DECOMP/DISCECTOMY FUSION  04/19/2012   Procedure: ANTERIOR CERVICAL DECOMPRESSION/DISCECTOMY FUSION 2 LEVELS;  Surgeon: Carmela Hurt, MD;  Location: [facility];  Service: Neurosurgery;  Laterality: N/A;  Cervical four-five,Cervical five-six anterior cervical decompression with fusion plating and bonegraft possible posterior cervical decompression   APPENDECTOMY     ARTERY BIOPSY Left 09/15/2022   Procedure: BIOPSY LEFT TEMPORAL ARTERY;  Surgeon: Nada Libman, MD;  Location: [facility] OR;  Service: Vascular;  Laterality: Left;   CARPAL TUNNEL RELEASE  1990   CERVICAL FUSION  04/19/2012   EYE SURGERY     ILIAC ARTERY STENT     PERIPHERAL VASCULAR CATHETERIZATION N/A 01/31/2016   Procedure: Abdominal Aortogram;  Surgeon: Runell Gess, MD;  Location: [facility] INVASIVE CV LAB;  Service: Cardiovascular;  Laterality: N/A;   PERIPHERAL VASCULAR CATHETERIZATION Bilateral 01/31/2016   Procedure: Lower Extremity Angiography;  Surgeon: Runell Gess, MD;  Location: [facility] INVASIVE CV LAB;  Service: Cardiovascular;  Laterality: Bilateral;   PERIPHERAL VASCULAR CATHETERIZATION Left 01/31/2016   Procedure: Peripheral Vascular Intervention;  Surgeon: Runell Gess, MD;  Location: [facility] INVASIVE CV LAB;  Service: Cardiovascular;  Laterality: Left;  ILIAC   TEE WITHOUT CARDIOVERSION N/A 11/01/2020   Procedure: TRANSESOPHAGEAL ECHOCARDIOGRAM (TEE);  Surgeon: Quintella Reichert, MD;  Location: [facility]  ENDOSCOPY;  Service: Cardiovascular;  Laterality: N/A;   TEE WITHOUT CARDIOVERSION N/A 11/25/2020   Procedure: TRANSESOPHAGEAL ECHOCARDIOGRAM (TEE);  Surgeon: Quintella Reichert, MD;  Location: [facility] ENDOSCOPY;  Service: Cardiovascular;  Laterality: N/A;   TONSILLECTOMY       Family History: Family History  Problem Relation Age of Onset   Angina Mother    Congestive Heart Failure Mother    Heart attack Father    Cancer Sister        Lymphoma   Neuropathy Sister    Brain cancer Sister    Melanoma Brother    Congestive Heart Failure Brother    Heart disease Brother    Osteoarthritis Daughter    Colon cancer Neg Hx    Stomach cancer Neg Hx    Esophageal cancer Neg Hx     Social History: Social History   Socioeconomic History   Marital status: Widowed    Spouse name: Not on file   Number of children: 1   Years of education: Not on file   Highest education level: Not on file  Occupational History   Occupation: Retired   Occupation: retired  Tobacco Use  Smoking status: Former    Current packs/day: 0.00    Average packs/day: 1 pack/day for 40.0 years (40.0 ttl pk-yrs)    Types: Cigarettes    Start date: 07/24/1970    Quit date: 07/24/2010    Years since quitting: 12.7    Passive exposure: Past   Smokeless tobacco: Never   Tobacco comments:    STARTED BACK JULY 2013 AND RECENTLY QUIT 03-10-2012  Vaping Use   Vaping status: Never Used  Substance and Sexual Activity   Alcohol use: Never    Comment: QUIT IN 1999   Drug use: Never   Sexual activity: Not Currently    Comment: 1st intercourse 82 yo-Fewer than 5 partners  Other Topics Concern   Not on file  Social History Narrative   ** Merged History Encounter **       Social Determinants of Health   Financial Resource Strain: Low Risk  (10/13/2022)   Overall Financial Resource Strain (CARDIA)    Difficulty of Paying Living Expenses: Not hard at all  Food Insecurity: No Food Insecurity (10/13/2022)   Hunger Vital Sign     Worried About Running Out of Food in the Last Year: Never true    Ran Out of Food in the Last Year: Never true  Transportation Needs: No Transportation Needs (10/13/2022)   PRAPARE - Administrator, Civil Service (Medical): No    Lack of Transportation (Non-Medical): No  Physical Activity: Insufficiently Active (10/13/2022)   Exercise Vital Sign    Days of Exercise per Week: 7 days    Minutes of Exercise per Session: 10 min  Stress: Stress Concern Present (10/13/2022)   [employer] of Occupational Health - Occupational Stress Questionnaire    Feeling of Stress : Very much  Social Connections: Moderately Integrated (10/13/2022)   Social Connection and Isolation Panel [NHANES]    Frequency of Communication with Friends and Family: More than three times a week    Frequency of Social Gatherings with Friends and Family: More than three times a week    Attends Religious Services: More than 4 times per year    Active Member of [employer] or Organizations: Yes    Attends Banker Meetings: Never    Marital Status: Widowed    Allergies: Allergies  Allergen Reactions   Statins    Statins Swelling, Rash and Other (See Comments)    Swelling involving tongue; also causes muscle pain    Outpatient Meds: Current Outpatient Medications  Medication Sig Dispense Refill   acetaminophen (TYLENOL) 500 MG tablet Take 500 mg by mouth every 8 (eight) hours as needed for moderate pain.     albuterol (VENTOLIN HFA) 108 (90 Base) MCG/ACT inhaler Inhale 1 puff into the lungs daily as needed for shortness of breath.     amLODipine (NORVASC) 2.5 MG tablet Take 1 tablet (2.5 mg total) by mouth daily.     azithromycin (ZITHROMAX) 250 MG tablet Take 1 tablet (250 mg total) by mouth daily. Take first 2 tablets together, then 1 every day until finished. 6 tablet 0   busPIRone (BUSPAR) 10 MG tablet TAKE TWO TABLETS BY MOUTH THREE TIMES A DAY 180 tablet 2   clopidogrel (PLAVIX) 75 MG tablet Take 1  tablet (75 mg total) by mouth daily. 30 tablet 3   divalproex (DEPAKOTE) 125 MG DR tablet Take 2 tablets (250 mg total) by mouth 2 (two) times daily. 120 tablet 2   furosemide (LASIX) 20 MG tablet Take 20 mg  by mouth every other day.     gabapentin (NEURONTIN) 100 MG capsule Take 1 capsule (100 mg total) by mouth 3 (three) times daily. 90 capsule 3   nystatin (MYCOSTATIN/NYSTOP) powder Apply 1 Application topically 3 (three) times daily. Under the breast 120 g 3   nystatin-triamcinolone ointment (MYCOLOG) Apply 1 Application topically 2 (two) times daily. Under the breasts 60 g 3   pantoprazole (PROTONIX) 40 MG tablet Take 1 tablet (40 mg total) by mouth 2 (two) times daily. 180 tablet 1   Propyl Glycol-Hydroxyethylcell (NASAL MOIST) GEL Apply after using nasal saline spray 28.5 g 2   sodium chloride (OCEAN) 0.65 % SOLN nasal spray Use twice daily to keep nose moist. 104 mL 2   sucralfate (CARAFATE) 1 g tablet Take 1 tablet (1 g total) by mouth 4 (four) times daily -  with meals and at bedtime. Crush into powder, add water, and drink when taking 120 tablet 5   traMADol (ULTRAM) 50 MG tablet Take 1 tablet (50 mg total) by mouth 3 (three) times daily as needed for moderate pain. 90 tablet 1   amoxicillin-clavulanate (AUGMENTIN) 500-125 MG tablet Take 1 tablet by mouth 2 (two) times daily for 7 days. (Patient not taking: Reported on 04/04/2023) 14 tablet 0   mupirocin ointment (BACTROBAN) 2 % Place 1 Application into the nose 2 (two) times daily. (Patient not taking: Reported on 04/04/2023) 22 g 0   No current facility-administered medications for this visit.      ___________________________________________________________________ Objective   Exam:  BP 130/80   Pulse 77   Ht 4\' 11"  (1.499 m)   Wt 140 lb (63.5 kg)   BMI 28.28 kg/m  Wt Readings from Last 3 Encounters:  04/04/23 140 lb (63.5 kg)  04/04/23 154 lb 12.8 oz (70.2 kg)  04/01/23 158 lb 11.7 oz (72 kg)    General: Chronically  ill-appearing woman using a rolling walker.  She needed considerable assistance to get up on the exam table. Eyes: sclera anicteric, no redness ENT: oral mucosa moist without lesions, there is a tender and mildly enlarged nodule or node left side of the trachea CV: RRR, with peripheral edema and scaly venous stasis changes on lower legs bilaterally Resp good air entry bilaterally,, normal respiratory rate, no respiratory distress or wheezing or stridor. GI: soft, + gastric tenderness, with active bowel sounds. No guarding or palpable organomegaly noted. Skin; warm and dry, no rash or jaundice noted.  She appears to bruise easily with scattered ecchymoses Neuro: awake, alert and oriented x 3. Normal gross motor function and fluent speech.  Antalgic gait  Labs:     Latest Ref Rng & Units 04/01/2023    6:52 AM 03/19/2023    4:56 PM 10/23/2022    4:48 PM  CBC  WBC 4.0 - 10.5 K/uL 3.9  5.1  6.3   Hemoglobin 12.0 - 15.0 g/dL 16.1  09.6  04.5   Hematocrit 36.0 - 46.0 % 35.1  36.2  37.0   Platelets 150 - 400 K/uL 73  179  239    Chronically high MCV     Latest Ref Rng & Units 04/01/2023    6:52 AM 10/03/2022    5:36 PM 09/16/2022    1:24 AM  CMP  Glucose 70 - 99 mg/dL 74  81  409   BUN 8 - 23 mg/dL 25  41  16   Creatinine 0.44 - 1.00 mg/dL 8.11  9.14  7.82   Sodium 135 -  145 mmol/L 137  140  136   Potassium 3.5 - 5.1 mmol/L 3.9  4.7  4.2   Chloride 98 - 111 mmol/L 103  103  100   CO2 22 - 32 mmol/L 20  22  26    Calcium 8.9 - 10.3 mg/dL 9.2  9.2  9.2   Total Protein 6.5 - 8.1 g/dL 6.5     Total Bilirubin 0.3 - 1.2 mg/dL 0.7     Alkaline Phos 38 - 126 U/L 56     AST 15 - 41 U/L 26     ALT 0 - 44 U/L 11      Last iron studies, B12 and folate normal in October 2023  COVID-negative in ED  [facility] urinalysis 04/01/2023  Radiologic Studies:  No acute bony injury on CT head or cervical spine ED visit 3 days ago.  Spine films show evidence of prior ACDF and degenerative  changes.  _______________  Narrative & Impression  CLINICAL DATA:  82 year old female history of trauma from a fall. Fever. Possible sepsis.   EXAM: PORTABLE CHEST 1 VIEW   COMPARISON:  Chest x-ray 09/14/2022.   FINDINGS: Lung volumes are slightly low. Diffuse interstitial prominence and widespread peribronchial cuffing. No confluent consolidative airspace disease. No pleural effusions. No pneumothorax. Pulmonary vasculature does not appear engorged. Heart size is normal. The patient is rotated to the right on today's exam, resulting in distortion of the mediastinal contours and reduced diagnostic sensitivity and specificity for mediastinal pathology. Atherosclerotic calcifications are noted in the thoracic aorta.   IMPRESSION: 1. The appearance of the chest suggest an acute bronchitis. 2. Aortic atherosclerosis.     Electronically Signed   By: Trudie Reed M.D.   On: 04/01/2023 07:15   CT ABDOMEN PELVIS FINDINGS 07-22-22   Hepatobiliary: No focal liver abnormality is seen. Gallbladder is unremarkable. No bile duct dilatation.   Pancreas: Unremarkable. No pancreatic ductal dilatation or surrounding inflammatory changes.   Spleen: Mildly prominent in size but otherwise unremarkable.   Adrenals/Urinary Tract: Adrenal glands appear normal. LEFT kidney is atrophic. RIGHT kidney is unremarkable without suspicious mass, stone or hydronephrosis. No ureteral or bladder calculi are identified. Bladder walls appear mildly thickened circumferentially, but characterization is limited by bladder decompression.   Stomach/Bowel: No dilated large or small bowel loops. Scattered diverticulosis of the descending and sigmoid colon but no focal inflammatory change is seen to suggest acute diverticulitis. No evidence of a focal or generalized bowel wall inflammation. Stomach is unremarkable, partially decompressed. Appendix is not seen but there are no inflammatory changes about  the cecum to suggest acute appendicitis.   Vascular/Lymphatic: Aortic atherosclerosis. No abdominal aortic aneurysm. No acute-appearing vascular abnormality. No enlarged lymph nodes are seen in the abdomen or pelvis.   Reproductive: No mass or free fluid.   Other: No free fluid or abscess collection. No free intraperitoneal air.   Musculoskeletal: Degenerative spondylosis of the slightly scoliotic lumbar spine, at least moderate in degree. No acute findings.   IMPRESSION: 1. Bladder walls appear mildly thickened circumferentially, but characterization is limited by bladder decompression. Recommend correlation with urinalysis to exclude cystitis. 2. Otherwise, no acute findings within the chest, abdomen or pelvis. No other possible source for sepsis identified. 3. Colonic diverticulosis without evidence of acute diverticulitis. 4. Three-vessel coronary artery calcifications. 5. LEFT renal atrophy. 6. Degenerative spondylosis of the scoliotic thoracic and lumbar spine, at least moderate in degree.   Assessment: Epigastric pain  Esophageal dysphagia  Loss of weight  Medically complex  81 year old woman with at least several months of dysphagia and probable regurgitation with weight loss.  She has other concomitant issues of a thyroid nodule, mildly enlarged lymph node or other nodule left side of the trachea on exam (?  If previously evaluated), epigastric pain on exam and recent bronchitis causing a fall, confusion and ED visit discovering pancytopenia new from a few weeks prior as well as some mild acute on chronic renal dysfunction.  She was seen by primary care earlier today, their note is not yet in there.  No further lab results from today.  Not yet clear how her epigastric tenderness is related to the other upper digestive symptoms, but it all needs further investigation.  However, she has not yet ready for upper endoscopy due to her recent bronchitis, pancytopenia that  needs to recover (assuming it is transient and related to the acute infection), improvement in renal function, and determination of whether or not this patient could be briefly off Plavix for 5 days before an EGD.  There is more information I need to get from her primary care providers to make those plans.  For now, we have arranged an upper GI series, and I will communicate closely with primary care to be sure this patient is getting repeat labs and medical evaluation.  She has an appointment with them on October 7, and will certainly need her blood counts and renal function rechecked as well as perhaps a repeat chest x-ray if her respiratory status is still in question.   With that, we can then make a plan for upper endoscopy which I believe would need to be done in the hospital based endoscopy lab due to her medical issues.  Meanwhile, continue current acid suppression regimen, and I am glad her Carafate was discontinued because she has a solitary kidney and CKD.  Thank you for the courtesy of this consult.  Please call me with any questions or concerns.   - Amada Jupiter, MD    [facility]  Ladona Mow M Kadhim,acting as a scribe for [name] Pitter III, MD.,have documented all relevant documentation on the behalf of [name] Rist, MD,as directed by  [name] Rist, MD while in the presence of [name] Rist, MD.   Marvis Repress III, MD, have reviewed all documentation for this visit. The documentation on 04/04/23 for the exam, diagnosis, procedures, and orders are all accurate and complete.    60 minutes were spent on this encounter (including chart review, history/exam, counseling/coordination of care, and documentation) > 50% of that time was spent on counseling and coordination of care.  CC: Referring provider noted above
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
## 2023-04-04 NOTE — Patient Instructions (Signed)
_______________________________________________________  If your blood pressure at your visit was 140/90 or greater, please contact your primary care physician to follow up on this.  _______________________________________________________  If you are age 84 or older, your body mass index should be between 23-30. Your Body mass index is 28.28 kg/m. If this is out of the aforementioned range listed, please consider follow up with your Primary Care Provider.  If you are age 27 or younger, your body mass index should be between 19-25. Your Body mass index is 28.28 kg/m. If this is out of the aformentioned range listed, please consider follow up with your Primary Care Provider.   ________________________________________________________  The [facility] providers would like to encourage you to use [facility] to communicate with providers for non-urgent requests or questions.  Due to long hold times on the telephone, sending your provider a message by [facility] may be a faster and more efficient way to get a response.  Please allow 48 business hours for a response.  Please remember that this is for non-urgent requests.  _______________________________________________________   Bonita Quin have been scheduled for an Upper GI Series at [facility]. Your appointment is on 04-12-2023 at 10:00am. Please arrive 30 minutes prior to your test for registration. Make sure not to eat or drink anything after midnight on the night before your test. If you need to reschedule, please call radiology at [phone]. ________________________________________________________________ An upper GI series uses x rays to help diagnose problems of the upper GI tract, which includes the esophagus, stomach, and duodenum. The duodenum is the first part of the small intestine. An upper GI series is conducted by a radiology technologist or a radiologist--a doctor who specializes in x-ray imaging--at a hospital or outpatient center. While  sitting or standing in front of an x-ray machine, the patient drinks barium liquid, which is often white and has a chalky consistency and taste. The barium liquid coats the lining of the upper GI tract and makes signs of disease show up more clearly on x rays. X-ray video, called fluoroscopy, is used to view the barium liquid moving through the esophagus, stomach, and duodenum. Additional x rays and fluoroscopy are performed while the patient lies on an x-ray table. To fully coat the upper GI tract with barium liquid, the technologist or radiologist may press on the abdomen or ask the patient to change position. Patients hold still in various positions, allowing the technologist or radiologist to take x rays of the upper GI tract at different angles. If a technologist conducts the upper GI series, a radiologist will later examine the images to look for problems.  This test typically takes about 1 hour to complete. __________________________________________________________________   Due to recent changes in healthcare laws, you may see the results of your imaging and laboratory studies on MyChart before your provider has had a chance to review them.  We understand that in some cases there may be results that are confusing or concerning to you. Not all laboratory results come back in the same time frame and the provider may be waiting for multiple results in order to interpret others.  Please give [place] 48 hours in order for your provider to thoroughly review all the results before contacting the office for clarification of your results.   It was a pleasure to see you today!  Thank you for trusting me with your gastrointestinal care!

## 2023-04-04 NOTE — Patient Instructions (Signed)
It was wonderful to see you today.  Please bring ALL of your medications with you to every visit.   Today we talked about:  Bronchitis - You seem to be improving. Your lungs sound like there is still some inflammation or infection but it is improving. You did not have a fever today. Continue taking the tylenol but you can space it out now. Make sure to finish all of your antibiotics.   Right forearm lesion - It seems to be getting better. We will keep an eye on it rather than biopsying it now.   Toenails - We will do your toenails on the appointment with me on 10/4, next Friday.   Thank you for choosing [facility].   Please call [phone] with any questions about today's appointment.  Lockie Mola, MD  Family Medicine

## 2023-04-05 LAB — CULTURE, BLOOD (ROUTINE X 2)
Culture: NO GROWTH
Culture: NO GROWTH

## 2023-04-12 ENCOUNTER — Ambulatory Visit (HOSPITAL_COMMUNITY)
Admission: RE | Admit: 2023-04-12 | Discharge: 2023-04-12 | Disposition: A | Discharge status: Home / Self Care | Payer: HMO | Source: Ambulatory Visit | Attending: Gastroenterology | Admitting: Gastroenterology

## 2023-04-12 DIAGNOSIS — R1013 Epigastric pain: Secondary | ICD-10-CM | POA: Diagnosis present

## 2023-04-12 DIAGNOSIS — R1319 Other dysphagia: Secondary | ICD-10-CM | POA: Diagnosis present

## 2023-04-12 DIAGNOSIS — R634 Abnormal weight loss: Secondary | ICD-10-CM | POA: Diagnosis present

## 2023-04-13 ENCOUNTER — Ambulatory Visit: Payer: Self-pay | Admitting: Family Medicine

## 2023-04-16 ENCOUNTER — Ambulatory Visit: Payer: HMO | Admitting: Student

## 2023-04-16 ENCOUNTER — Encounter: Payer: Self-pay | Admitting: Student

## 2023-04-16 VITALS — BP 148/82 | HR 82 | Ht 59.0 in | Wt 154.0 lb

## 2023-04-16 DIAGNOSIS — L608 Other nail disorders: Secondary | ICD-10-CM

## 2023-04-16 DIAGNOSIS — W19XXXA Unspecified fall, initial encounter: Secondary | ICD-10-CM

## 2023-04-16 DIAGNOSIS — I739 Peripheral vascular disease, unspecified: Secondary | ICD-10-CM | POA: Diagnosis not present

## 2023-04-16 DIAGNOSIS — I10 Essential (primary) hypertension: Secondary | ICD-10-CM | POA: Diagnosis not present

## 2023-04-16 DIAGNOSIS — R296 Repeated falls: Secondary | ICD-10-CM | POA: Diagnosis not present

## 2023-04-16 DIAGNOSIS — Y92009 Unspecified place in unspecified non-institutional (private) residence as the place of occurrence of the external cause: Secondary | ICD-10-CM

## 2023-04-16 MED ORDER — CLOPIDOGREL BISULFATE 75 MG PO TABS
75.0000 mg | ORAL_TABLET | Freq: Every day | ORAL | 3 refills | Status: DC
Start: 2023-04-16 — End: 2023-06-20

## 2023-04-16 MED ORDER — AMLODIPINE BESYLATE 2.5 MG PO TABS: 2.5000 mg | ORAL_TABLET | Freq: Every day | ORAL | 1 refills | Status: DC

## 2023-04-16 NOTE — Assessment & Plan Note (Addendum)
Home health physical therapy order placed today.  Mitigate risk factors such as hypotension as above, decided not to increase gabapentin for neuropathy due to falls, caution patient on use of tramadol.  Patient currently has all home DME including walker, bedside commode, shower seat.

## 2023-04-16 NOTE — Assessment & Plan Note (Addendum)
Due to patient having a history of recurrent falls tight blood pressure control is not indicated.  Checked her blood pressure standing at 148/82 which is appropriate for her.  She has many risk factors for falls including gait instability secondary to PAD and neuropathy, currently on blood thinner Plavix for PAD.  -Continue amlodipine 2.5 mg -Blood pressure log for patient given today -Titrate blood pressure to standing

## 2023-04-16 NOTE — Progress Notes (Signed)
    SUBJECTIVE:   CHIEF COMPLAINT / HPI:   [name] [name] is a 82 y.o. female  presenting for blood pressure follow up and recurrent falls. She has been recently hospitalized secondary to a fall at home.  She reports her blood pressure was extremely low at the time.  Since then she has been taking 2.5 mg amlodipine daily.  She checks her blood pressure at home and reports that usually sits in the 140s systolic.  She has not had any recent falls since being hospitalized.  She would also like to have her toenails clipped today.  PERTINENT  PMH / PSH: Reviewed and updated   OBJECTIVE:   BP (!) 148/82 (Patient Position: Standing)   Pulse 82   Ht 4\' 11"  (1.499 m)   Wt 154 lb (69.9 kg)   SpO2 96%   BMI 31.10 kg/m   Frail-appearing, no acute distress, ambulates with walker at baseline Cardio: Regular rate, regular rhythm, no murmurs on exam. Pulm: Clear, no wheezing, no crackles. No increased work of breathing Abdominal: bowel sounds present, soft, non-tender, non-distended Extremities: Phonic venous stasis secondary to PAD, long toenail present on left great toe without signs of acute infection Neuro: alert and oriented x3, speech normal in content, no facial asymmetry Psych:  Cognition and judgment appear intact. Alert, communicative  and cooperative with normal attention span and concentration. No apparent delusions, illusions, hallucinations      03/19/2023    3:06 PM 12/29/2022   10:56 AM 12/08/2022   11:42 AM  PHQ9 SCORE ONLY  PHQ-9 Total Score 19 21 7       ASSESSMENT/PLAN:   Essential hypertension Due to patient having a history of recurrent falls tight blood pressure control is not indicated.  Checked her blood pressure standing at 148/82 which is appropriate for her.  She has many risk factors for falls including gait instability secondary to PAD and neuropathy, currently on blood thinner Plavix for PAD.  -Continue amlodipine 2.5 mg -Blood pressure log for patient given  today -Titrate blood pressure to standing  Recurrent falls Home health physical therapy order placed today.  Mitigate risk factors such as hypotension as above, decided not to increase gabapentin for neuropathy due to falls, caution patient on use of tramadol.  Patient currently has all home DME including walker, bedside commode, shower seat.   Toenail care: With patient having severe peripheral artery disease, chronic venostasis, and poor mobility at baseline, do not feel comfortable attempting toenail care within this office.  Sent referral to podiatry for management.  Glendale Chard, DO [facility]

## 2023-04-16 NOTE — Patient Instructions (Addendum)
Blood pressure: continue taking amlodipine 2.5 mg daily for your pressure. Check your blood pressure daily at home. Please call the office if your blood pressure is more than 160-170 consistently.   I am sending a referral to podiatry to have your toenails taken care of and a referral for physical therapy to come to your house.   Blood Pressure Record Sheet To take your blood pressure, you will need a blood pressure machine. You can buy a blood pressure machine (blood pressure monitor) at your clinic, drug store, or online. When choosing one, consider: An automatic monitor that has an arm cuff. A cuff that wraps snugly around your upper arm. You should be able to fit only one finger between your arm and the cuff. A device that stores blood pressure reading results. Do not choose a monitor that measures your blood pressure from your wrist or finger. Follow your health care provider's instructions for how to take your blood pressure. To use this form: Take your blood pressure medications every day These measurements should be taken when you have been at rest for at least 10-15 min Take at least 2 readings with each blood pressure check. This makes sure the results are correct. Wait 1-2 minutes between measurements. Write down the results in the spaces on this form. Keep in mind it should always be recorded systolic over diastolic. Both numbers are important.  Repeat this every day for 2-3 weeks, or as told by your health care provider.  Make a follow-up appointment with your health care provider to discuss the results.  Blood Pressure Log Date Medications taken? (Y/N) Blood Pressure Time of Day                                                                                                         Future Appointments  Date Time Provider Department Center  05/25/2023  2:30 PM [facility] [place] 2 [facility]  02/08/2024 10:20 AM Motwani, Komal, MD [facility] None     Please arrive 15 minutes before your appointment to ensure smooth check in process.    Please call the clinic at [phone] if your symptoms worsen or you have any concerns.  Thank you for allowing me to participate in your care, Dr. Glendale Chard [facility]

## 2023-04-26 ENCOUNTER — Other Ambulatory Visit: Payer: Self-pay | Admitting: Family Medicine

## 2023-04-30 ENCOUNTER — Ambulatory Visit (INDEPENDENT_AMBULATORY_CARE_PROVIDER_SITE_OTHER): Payer: HMO | Admitting: Podiatry

## 2023-04-30 DIAGNOSIS — B351 Tinea unguium: Secondary | ICD-10-CM | POA: Diagnosis not present

## 2023-04-30 DIAGNOSIS — M79674 Pain in right toe(s): Secondary | ICD-10-CM | POA: Diagnosis not present

## 2023-04-30 DIAGNOSIS — M79675 Pain in left toe(s): Secondary | ICD-10-CM

## 2023-04-30 DIAGNOSIS — S90222A Contusion of left lesser toe(s) with damage to nail, initial encounter: Secondary | ICD-10-CM

## 2023-04-30 NOTE — Progress Notes (Signed)
Subjective:  Patient ID: [name] [name], adult    DOB: 1941/05/29,  MRN: [id number]   [name] [name] presents to clinic today for:  Chief Complaint  Patient presents with   Nail Problem    Nail trim x 10-  on blood thinner.  Skin is dry.  Bruise on left great toenail from where she hits it.  Had someone from homecare that was coming to her house -  they were last trimmed in February.    . Patient notes nails are thick, discolored, elongated and painful in shoegear when trying to ambulate.  Patient is on a blood thinner.  Mom notes that her left great toenail is slightly sore from hitting it against her walker  PCP is Glendale Chard, DO.  Past Medical History:  Diagnosis Date   Abscess 05/03/2022   Acute kidney injury superimposed on chronic kidney disease (HCC) 09/06/2019   Anxiety    Arthritis    RA   Carpal tunnel syndrome, bilateral    Cellulitis 05/01/2022   Chronic kidney disease    CKD stage III, absence of left kidney   CKD (chronic kidney disease) stage 3, GFR 30-59 ml/min (HCC) 09/06/2019   Congenital absence of one kidney    Pt has right kidney only   Depression    Dyslipidemia 09/06/2019   Elevated d-dimer 10/23/2020   Essential hypertension 09/06/2019   GERD (gastroesophageal reflux disease)    Headache    Heart murmur    Homicidal ideation    Hypercholesteremia    Hypertension    Mammogram abnormal 10/08/2022   MRSA bacteremia 10/29/2020   Pain in left shoulder 12/13/2020   Peripheral vascular disease (HCC)    a. s/p R external iliac stent '06 with angioplasty '13 (Dr. Carollee Massed) b. 01/2016: s/p PTA/stenting in L common iliac artery (Dr. Allyson Sabal)   Pneumonia of right lower lobe due to infectious organism 11/11/2017   Polymyalgia rheumatica (HCC)    Psoriasis    Sepsis (HCC) 07/22/2022   Sepsis due to pneumonia (HCC) 11/21/2020   Sepsis with acute hypoxic respiratory failure (HCC) 09/01/2020   Shortness of breath 10/23/2020   Sleep apnea    Tremor 09/06/2019     Past Surgical History:  Procedure Laterality Date   ABDOMINAL HYSTERECTOMY  1963   Partial,  Due to bleeding after delivery   ANTERIOR CERVICAL DECOMP/DISCECTOMY FUSION  04/19/2012   Procedure: ANTERIOR CERVICAL DECOMPRESSION/DISCECTOMY FUSION 2 LEVELS;  Surgeon: Carmela Hurt, MD;  Location: [facility] NEURO ORS;  Service: Neurosurgery;  Laterality: N/A;  Cervical four-five,Cervical five-six anterior cervical decompression with fusion plating and bonegraft possible posterior cervical decompression   APPENDECTOMY     ARTERY BIOPSY Left 09/15/2022   Procedure: BIOPSY LEFT TEMPORAL ARTERY;  Surgeon: Nada Libman, MD;  Location: [facility] OR;  Service: Vascular;  Laterality: Left;   CARPAL TUNNEL RELEASE  1990   CERVICAL FUSION  04/19/2012   EYE SURGERY     ILIAC ARTERY STENT     PERIPHERAL VASCULAR CATHETERIZATION N/A 01/31/2016   Procedure: Abdominal Aortogram;  Surgeon: Runell Gess, MD;  Location: [facility] INVASIVE CV LAB;  Service: Cardiovascular;  Laterality: N/A;   PERIPHERAL VASCULAR CATHETERIZATION Bilateral 01/31/2016   Procedure: Lower Extremity Angiography;  Surgeon: Runell Gess, MD;  Location: [facility] INVASIVE CV LAB;  Service: Cardiovascular;  Laterality: Bilateral;   PERIPHERAL VASCULAR CATHETERIZATION Left 01/31/2016   Procedure: Peripheral Vascular Intervention;  Surgeon: Runell Gess, MD;  Location: [facility] INVASIVE  CV LAB;  Service: Cardiovascular;  Laterality: Left;  ILIAC   TEE WITHOUT CARDIOVERSION N/A 11/01/2020   Procedure: TRANSESOPHAGEAL ECHOCARDIOGRAM (TEE);  Surgeon: Quintella Reichert, MD;  Location: [facility] ENDOSCOPY;  Service: Cardiovascular;  Laterality: N/A;   TEE WITHOUT CARDIOVERSION N/A 11/25/2020   Procedure: TRANSESOPHAGEAL ECHOCARDIOGRAM (TEE);  Surgeon: Quintella Reichert, MD;  Location: [facility] ENDOSCOPY;  Service: Cardiovascular;  Laterality: N/A;   TONSILLECTOMY      Allergies  Allergen Reactions   Statins    Statins Swelling, Rash and Other (See Comments)    Swelling  involving tongue; also causes muscle pain    Review of Systems: Negative except as noted in the HPI.  Objective:  HEMA MCALEESE is a pleasant 82 y.o. adult in NAD. AAO x 3.  Vascular Examination: Capillary refill time is 3-5 seconds to toes bilateral.  Trace palpable pedal pulses b/l LE. Digital hair sparse b/l.  Skin temperature gradient WNL b/l. No varicosities b/l. No cyanosis noted b/l.  Mild edema bilateral.  Dermatological Examination: Pedal skin with normal turgor, texture and tone b/l. No open wounds. No interdigital macerations b/l. Toenails x10 are 3mm thick, discolored, dystrophic with subungual debris. There is pain with compression of the nail plates.  They are elongated x10.  Left hallux nail has minimal ecchymosis towards the distal 30% of the nail.  The nail is not loose.  No periungual or subungual hematoma is noted    Latest Ref Rng & Units 10/03/2022    5:36 PM  Hemoglobin A1C  Hemoglobin-A1c 4.8 - 5.6 % 4.9    Assessment/Plan: 1. Pain due to onychomycosis of toenails of both feet   2. Contusion of toenail of left foot, initial encounter    The mycotic toenails were sharply debrided x10 with sterile nail nippers and a power debriding burr to decrease bulk/thickness and length.  Patient noted improvement in the discomfort of the hallux nail after debridement today.  Return in about 3 months (around 07/31/2023) for RFC.   Clerance Lav, DPM, FACFAS [facility]     [address] [place], [place] [address]                Office [phone]  Fax [phone]

## 2023-05-07 ENCOUNTER — Other Ambulatory Visit: Payer: Self-pay | Admitting: Student

## 2023-05-07 DIAGNOSIS — W19XXXA Unspecified fall, initial encounter: Secondary | ICD-10-CM

## 2023-05-07 NOTE — Progress Notes (Signed)
Received fax noting patient had a fall walking out of the bathroom which she contributes to her uneven floors at home.  [employer] contacted and faxed over documentation requesting approval for medical social worker evaluation and treatment for 1 times per week for 4-weeks. They also recommended a life alert.  Will place order for social work evaluation.  Glendale Chard, DO [facility], PGY-2 05/07/23 8:58 AM

## 2023-05-25 ENCOUNTER — Ambulatory Visit
Admission: RE | Admit: 2023-05-25 | Discharge: 2023-05-25 | Disposition: A | Discharge status: Home / Self Care | Payer: HMO | Source: Ambulatory Visit | Attending: "Endocrinology | Admitting: "Endocrinology

## 2023-05-25 ENCOUNTER — Other Ambulatory Visit (HOSPITAL_COMMUNITY)
Admission: RE | Admit: 2023-05-25 | Discharge: 2023-05-25 | Disposition: A | Discharge status: Home / Self Care | Payer: HMO | Source: Ambulatory Visit | Attending: "Endocrinology | Admitting: "Endocrinology

## 2023-05-25 DIAGNOSIS — E041 Nontoxic single thyroid nodule: Secondary | ICD-10-CM | POA: Insufficient documentation

## 2023-05-28 LAB — CYTOLOGY - NON PAP

## 2023-06-06 ENCOUNTER — Other Ambulatory Visit: Payer: Self-pay | Admitting: Student

## 2023-06-06 DIAGNOSIS — M5416 Radiculopathy, lumbar region: Secondary | ICD-10-CM

## 2023-06-06 DIAGNOSIS — M4722 Other spondylosis with radiculopathy, cervical region: Secondary | ICD-10-CM

## 2023-06-07 ENCOUNTER — Inpatient Hospital Stay (HOSPITAL_COMMUNITY)
Admission: EM | Admit: 2023-06-07 | Discharge: 2023-06-11 | DRG: 193 | Disposition: A | Discharge status: Home Health | Payer: HMO | Attending: Family Medicine | Admitting: Family Medicine

## 2023-06-07 ENCOUNTER — Encounter (HOSPITAL_COMMUNITY): Payer: Self-pay

## 2023-06-07 ENCOUNTER — Other Ambulatory Visit: Payer: Self-pay

## 2023-06-07 ENCOUNTER — Emergency Department (HOSPITAL_COMMUNITY): Payer: HMO

## 2023-06-07 DIAGNOSIS — I739 Peripheral vascular disease, unspecified: Secondary | ICD-10-CM | POA: Diagnosis present

## 2023-06-07 DIAGNOSIS — F410 Panic disorder [episodic paroxysmal anxiety] without agoraphobia: Secondary | ICD-10-CM | POA: Diagnosis present

## 2023-06-07 DIAGNOSIS — Q600 Renal agenesis, unilateral: Secondary | ICD-10-CM | POA: Diagnosis not present

## 2023-06-07 DIAGNOSIS — Z87891 Personal history of nicotine dependence: Secondary | ICD-10-CM | POA: Diagnosis not present

## 2023-06-07 DIAGNOSIS — Z8614 Personal history of Methicillin resistant Staphylococcus aureus infection: Secondary | ICD-10-CM

## 2023-06-07 DIAGNOSIS — J9601 Acute respiratory failure with hypoxia: Secondary | ICD-10-CM | POA: Diagnosis not present

## 2023-06-07 DIAGNOSIS — M069 Rheumatoid arthritis, unspecified: Secondary | ICD-10-CM | POA: Diagnosis present

## 2023-06-07 DIAGNOSIS — Z7902 Long term (current) use of antithrombotics/antiplatelets: Secondary | ICD-10-CM

## 2023-06-07 DIAGNOSIS — H401133 Primary open-angle glaucoma, bilateral, severe stage: Secondary | ICD-10-CM | POA: Diagnosis present

## 2023-06-07 DIAGNOSIS — D696 Thrombocytopenia, unspecified: Secondary | ICD-10-CM | POA: Diagnosis present

## 2023-06-07 DIAGNOSIS — J189 Pneumonia, unspecified organism: Principal | ICD-10-CM | POA: Diagnosis present

## 2023-06-07 DIAGNOSIS — I10 Essential (primary) hypertension: Secondary | ICD-10-CM | POA: Diagnosis present

## 2023-06-07 DIAGNOSIS — E785 Hyperlipidemia, unspecified: Secondary | ICD-10-CM | POA: Diagnosis present

## 2023-06-07 DIAGNOSIS — G4733 Obstructive sleep apnea (adult) (pediatric): Secondary | ICD-10-CM | POA: Diagnosis present

## 2023-06-07 DIAGNOSIS — G250 Essential tremor: Secondary | ICD-10-CM | POA: Diagnosis present

## 2023-06-07 DIAGNOSIS — R040 Epistaxis: Secondary | ICD-10-CM | POA: Diagnosis present

## 2023-06-07 DIAGNOSIS — I129 Hypertensive chronic kidney disease with stage 1 through stage 4 chronic kidney disease, or unspecified chronic kidney disease: Secondary | ICD-10-CM | POA: Diagnosis present

## 2023-06-07 DIAGNOSIS — E7800 Pure hypercholesterolemia, unspecified: Secondary | ICD-10-CM | POA: Diagnosis present

## 2023-06-07 DIAGNOSIS — Z66 Do not resuscitate: Secondary | ICD-10-CM | POA: Diagnosis present

## 2023-06-07 DIAGNOSIS — F419 Anxiety disorder, unspecified: Secondary | ICD-10-CM | POA: Diagnosis not present

## 2023-06-07 DIAGNOSIS — K219 Gastro-esophageal reflux disease without esophagitis: Secondary | ICD-10-CM | POA: Diagnosis present

## 2023-06-07 DIAGNOSIS — M353 Polymyalgia rheumatica: Secondary | ICD-10-CM | POA: Diagnosis present

## 2023-06-07 DIAGNOSIS — Z79899 Other long term (current) drug therapy: Secondary | ICD-10-CM

## 2023-06-07 DIAGNOSIS — H409 Unspecified glaucoma: Secondary | ICD-10-CM | POA: Diagnosis present

## 2023-06-07 DIAGNOSIS — F32A Depression, unspecified: Secondary | ICD-10-CM | POA: Diagnosis present

## 2023-06-07 DIAGNOSIS — N1832 Chronic kidney disease, stage 3b: Secondary | ICD-10-CM | POA: Diagnosis present

## 2023-06-07 DIAGNOSIS — Z888 Allergy status to other drugs, medicaments and biological substances status: Secondary | ICD-10-CM

## 2023-06-07 DIAGNOSIS — Z1152 Encounter for screening for COVID-19: Secondary | ICD-10-CM | POA: Diagnosis not present

## 2023-06-07 DIAGNOSIS — Z8249 Family history of ischemic heart disease and other diseases of the circulatory system: Secondary | ICD-10-CM

## 2023-06-07 DIAGNOSIS — Z808 Family history of malignant neoplasm of other organs or systems: Secondary | ICD-10-CM

## 2023-06-07 DIAGNOSIS — Z807 Family history of other malignant neoplasms of lymphoid, hematopoietic and related tissues: Secondary | ICD-10-CM

## 2023-06-07 DIAGNOSIS — F4381 Prolonged grief disorder: Secondary | ICD-10-CM | POA: Diagnosis present

## 2023-06-07 DIAGNOSIS — R0902 Hypoxemia: Secondary | ICD-10-CM

## 2023-06-07 DIAGNOSIS — F332 Major depressive disorder, recurrent severe without psychotic features: Secondary | ICD-10-CM | POA: Diagnosis present

## 2023-06-07 DIAGNOSIS — R5381 Other malaise: Secondary | ICD-10-CM | POA: Diagnosis present

## 2023-06-07 HISTORY — DX: Pneumonia, unspecified organism: J18.9

## 2023-06-07 LAB — CBC WITH DIFFERENTIAL/PLATELET
Abs Immature Granulocytes: 0.24 10*3/uL — ABNORMAL HIGH (ref 0.00–0.07)
Basophils Absolute: 0 10*3/uL (ref 0.0–0.1)
Basophils Relative: 0 %
Eosinophils Absolute: 0 10*3/uL (ref 0.0–0.5)
Eosinophils Relative: 0 %
HCT: 37.1 % (ref 36.0–46.0)
Hemoglobin: 12.4 g/dL (ref 12.0–15.0)
Immature Granulocytes: 2 %
Lymphocytes Relative: 8 %
Lymphs Abs: 1.2 10*3/uL (ref 0.7–4.0)
MCH: 35.2 pg — ABNORMAL HIGH (ref 26.0–34.0)
MCHC: 33.4 g/dL (ref 30.0–36.0)
MCV: 105.4 fL — ABNORMAL HIGH (ref 80.0–100.0)
Monocytes Absolute: 1.1 10*3/uL — ABNORMAL HIGH (ref 0.1–1.0)
Monocytes Relative: 8 %
Neutro Abs: 12.7 10*3/uL — ABNORMAL HIGH (ref 1.7–7.7)
Neutrophils Relative %: 82 %
Platelets: 116 10*3/uL — ABNORMAL LOW (ref 150–400)
RBC: 3.52 MIL/uL — ABNORMAL LOW (ref 3.87–5.11)
RDW: 13.2 % (ref 11.5–15.5)
WBC: 15.3 10*3/uL — ABNORMAL HIGH (ref 4.0–10.5)
nRBC: 0 % (ref 0.0–0.2)

## 2023-06-07 LAB — COMPREHENSIVE METABOLIC PANEL
ALT: 9 U/L (ref 0–44)
AST: 17 U/L (ref 15–41)
Albumin: 3.7 g/dL (ref 3.5–5.0)
Alkaline Phosphatase: 55 U/L (ref 38–126)
Anion gap: 12 (ref 5–15)
BUN: 20 mg/dL (ref 8–23)
CO2: 24 mmol/L (ref 22–32)
Calcium: 9 mg/dL (ref 8.9–10.3)
Chloride: 101 mmol/L (ref 98–111)
Creatinine, Ser: 1.31 mg/dL — ABNORMAL HIGH (ref 0.44–1.00)
GFR, Estimated: 40 mL/min — ABNORMAL LOW (ref >60)
Glucose, Bld: 97 mg/dL (ref 70–99)
Potassium: 3.6 mmol/L (ref 3.5–5.1)
Sodium: 137 mmol/L (ref 135–145)
Total Bilirubin: 0.9 mg/dL (ref <1.2)
Total Protein: 7.3 g/dL (ref 6.5–8.1)

## 2023-06-07 LAB — RESP PANEL BY RT-PCR (RSV, FLU A&B, COVID)  RVPGX2
Influenza A by PCR: NEGATIVE
Influenza B by PCR: NEGATIVE
Resp Syncytial Virus by PCR: NEGATIVE
SARS Coronavirus 2 by RT PCR: NEGATIVE

## 2023-06-07 LAB — URINALYSIS, W/ REFLEX TO CULTURE (INFECTION SUSPECTED)
Bacteria, UA: NONE SEEN
Bilirubin Urine: NEGATIVE
Glucose, UA: NEGATIVE mg/dL
Hgb urine dipstick: NEGATIVE
Ketones, ur: 5 mg/dL — AB
Leukocytes,Ua: NEGATIVE
Nitrite: NEGATIVE
Protein, ur: 100 mg/dL — AB
Specific Gravity, Urine: 1.018 (ref 1.005–1.030)
pH: 6 (ref 5.0–8.0)

## 2023-06-07 LAB — I-STAT CG4 LACTIC ACID, ED: Lactic Acid, Venous: 1.4 mmol/L (ref 0.5–1.9)

## 2023-06-07 LAB — MAGNESIUM: Magnesium: 1.8 mg/dL (ref 1.7–2.4)

## 2023-06-07 LAB — PROTIME-INR
INR: 1.2 (ref 0.8–1.2)
Prothrombin Time: 14.9 s (ref 11.4–15.2)

## 2023-06-07 LAB — PHOSPHORUS: Phosphorus: 3 mg/dL (ref 2.5–4.6)

## 2023-06-07 LAB — APTT: aPTT: 29 s (ref 24–36)

## 2023-06-07 MED ORDER — TRAMADOL HCL 50 MG PO TABS
50.0000 mg | ORAL_TABLET | Freq: Three times a day (TID) | ORAL | Status: DC | PRN
  Administered 2023-06-07 – 2023-06-09 (×2): 50 mg via ORAL
  Filled 2023-06-07 (×2): qty 1

## 2023-06-07 MED ORDER — IOHEXOL 300 MG/ML  SOLN
75.0000 mL | Freq: Once | INTRAMUSCULAR | Status: AC | PRN
  Administered 2023-06-07: 75 mL via INTRAVENOUS

## 2023-06-07 MED ORDER — SUCRALFATE 1 G PO TABS
1.0000 g | ORAL_TABLET | Freq: Three times a day (TID) | ORAL | Status: DC
Start: 2023-06-07 — End: 2023-06-11
  Administered 2023-06-07 – 2023-06-11 (×14): 1 g via ORAL
  Filled 2023-06-07 (×14): qty 1

## 2023-06-07 MED ORDER — ACETAMINOPHEN 325 MG PO TABS
650.0000 mg | ORAL_TABLET | Freq: Once | ORAL | Status: AC
  Administered 2023-06-07: 650 mg via ORAL
  Filled 2023-06-07: qty 2

## 2023-06-07 MED ORDER — AZITHROMYCIN 500 MG IV SOLR
500.0000 mg | INTRAVENOUS | Status: DC
  Administered 2023-06-07: 500 mg via INTRAVENOUS
  Filled 2023-06-07 (×2): qty 5

## 2023-06-07 MED ORDER — ACETAMINOPHEN 650 MG RE SUPP: 650.0000 mg | Freq: Four times a day (QID) | RECTAL | Status: DC | PRN

## 2023-06-07 MED ORDER — IPRATROPIUM-ALBUTEROL 0.5-2.5 (3) MG/3ML IN SOLN
3.0000 mL | Freq: Three times a day (TID) | RESPIRATORY_TRACT | Status: DC
  Administered 2023-06-07 – 2023-06-09 (×5): 3 mL via RESPIRATORY_TRACT
  Filled 2023-06-07 (×5): qty 3

## 2023-06-07 MED ORDER — GABAPENTIN 100 MG PO CAPS
100.0000 mg | ORAL_CAPSULE | Freq: Three times a day (TID) | ORAL | Status: DC
  Administered 2023-06-07 – 2023-06-11 (×12): 100 mg via ORAL
  Filled 2023-06-07 (×12): qty 1

## 2023-06-07 MED ORDER — ALBUTEROL SULFATE (2.5 MG/3ML) 0.083% IN NEBU: 2.5000 mg | INHALATION_SOLUTION | RESPIRATORY_TRACT | Status: DC | PRN

## 2023-06-07 MED ORDER — ONDANSETRON HCL 4 MG PO TABS: 4.0000 mg | ORAL_TABLET | Freq: Four times a day (QID) | ORAL | Status: DC | PRN

## 2023-06-07 MED ORDER — POTASSIUM CHLORIDE CRYS ER 20 MEQ PO TBCR
40.0000 meq | EXTENDED_RELEASE_TABLET | Freq: Once | ORAL | Status: AC
  Administered 2023-06-07: 40 meq via ORAL
  Filled 2023-06-07: qty 2

## 2023-06-07 MED ORDER — AMLODIPINE BESYLATE 5 MG PO TABS
2.5000 mg | ORAL_TABLET | Freq: Every day | ORAL | Status: DC
  Administered 2023-06-08 – 2023-06-11 (×4): 2.5 mg via ORAL
  Filled 2023-06-07 (×4): qty 1

## 2023-06-07 MED ORDER — PANTOPRAZOLE SODIUM 40 MG IV SOLR
40.0000 mg | Freq: Once | INTRAVENOUS | Status: AC
  Administered 2023-06-07: 40 mg via INTRAVENOUS
  Filled 2023-06-07: qty 10

## 2023-06-07 MED ORDER — DIVALPROEX SODIUM 250 MG PO DR TAB
250.0000 mg | DELAYED_RELEASE_TABLET | Freq: Two times a day (BID) | ORAL | Status: DC
  Administered 2023-06-07 – 2023-06-11 (×9): 250 mg via ORAL
  Filled 2023-06-07 (×9): qty 1

## 2023-06-07 MED ORDER — NYSTATIN-TRIAMCINOLONE 100000-0.1 UNIT/GM-% EX OINT
1.0000 | TOPICAL_OINTMENT | Freq: Two times a day (BID) | CUTANEOUS | Status: DC
  Administered 2023-06-08 – 2023-06-11 (×6): 1 via TOPICAL
  Filled 2023-06-07 (×2): qty 15

## 2023-06-07 MED ORDER — LACTATED RINGERS IV SOLN: INTRAVENOUS | Status: DC

## 2023-06-07 MED ORDER — SALINE SPRAY 0.65 % NA SOLN
1.0000 | NASAL | Status: DC | PRN
  Filled 2023-06-07: qty 44

## 2023-06-07 MED ORDER — BUSPIRONE HCL 10 MG PO TABS
10.0000 mg | ORAL_TABLET | Freq: Three times a day (TID) | ORAL | Status: DC
  Administered 2023-06-07 – 2023-06-11 (×12): 10 mg via ORAL
  Filled 2023-06-07 (×12): qty 1

## 2023-06-07 MED ORDER — METHYLPREDNISOLONE SODIUM SUCC 40 MG IJ SOLR
40.0000 mg | Freq: Once | INTRAMUSCULAR | Status: AC
  Administered 2023-06-07: 40 mg via INTRAVENOUS
  Filled 2023-06-07: qty 1

## 2023-06-07 MED ORDER — LACTATED RINGERS IV BOLUS (SEPSIS)
1000.0000 mL | Freq: Once | INTRAVENOUS | Status: AC
  Administered 2023-06-07: 1000 mL via INTRAVENOUS

## 2023-06-07 MED ORDER — ENOXAPARIN SODIUM 30 MG/0.3ML IJ SOSY
30.0000 mg | PREFILLED_SYRINGE | INTRAMUSCULAR | Status: DC
  Administered 2023-06-07: 30 mg via SUBCUTANEOUS
  Filled 2023-06-07: qty 0.3

## 2023-06-07 MED ORDER — ONDANSETRON HCL 4 MG/2ML IJ SOLN
4.0000 mg | Freq: Four times a day (QID) | INTRAMUSCULAR | Status: DC | PRN
  Administered 2023-06-08: 4 mg via INTRAVENOUS
  Filled 2023-06-07: qty 2

## 2023-06-07 MED ORDER — IPRATROPIUM-ALBUTEROL 0.5-2.5 (3) MG/3ML IN SOLN
3.0000 mL | Freq: Once | RESPIRATORY_TRACT | Status: AC
  Administered 2023-06-07: 3 mL via RESPIRATORY_TRACT
  Filled 2023-06-07: qty 3

## 2023-06-07 MED ORDER — ACETAMINOPHEN 325 MG PO TABS
650.0000 mg | ORAL_TABLET | Freq: Four times a day (QID) | ORAL | Status: DC | PRN
  Administered 2023-06-09 – 2023-06-11 (×6): 650 mg via ORAL
  Filled 2023-06-07 (×6): qty 2

## 2023-06-07 MED ORDER — MAGNESIUM SULFATE 2 GM/50ML IV SOLN
2.0000 g | Freq: Once | INTRAVENOUS | Status: AC
  Administered 2023-06-07: 2 g via INTRAVENOUS
  Filled 2023-06-07: qty 50

## 2023-06-07 MED ORDER — SODIUM CHLORIDE 0.9 % IV SOLN
2.0000 g | INTRAVENOUS | Status: AC
  Administered 2023-06-07 – 2023-06-11 (×5): 2 g via INTRAVENOUS
  Filled 2023-06-07 (×5): qty 20

## 2023-06-07 MED ORDER — FENTANYL CITRATE PF 50 MCG/ML IJ SOSY: 25.0000 ug | PREFILLED_SYRINGE | INTRAMUSCULAR | Status: DC | PRN

## 2023-06-07 MED ORDER — ENOXAPARIN SODIUM 40 MG/0.4ML IJ SOSY: 40.0000 mg | PREFILLED_SYRINGE | INTRAMUSCULAR | Status: DC

## 2023-06-07 MED ORDER — ALPRAZOLAM 0.25 MG PO TABS
0.2500 mg | ORAL_TABLET | Freq: Two times a day (BID) | ORAL | Status: DC | PRN
  Administered 2023-06-08 – 2023-06-10 (×2): 0.25 mg via ORAL
  Filled 2023-06-07 (×2): qty 1

## 2023-06-07 MED ORDER — PANTOPRAZOLE SODIUM 40 MG PO TBEC
40.0000 mg | DELAYED_RELEASE_TABLET | Freq: Two times a day (BID) | ORAL | Status: DC
  Administered 2023-06-07 – 2023-06-11 (×8): 40 mg via ORAL
  Filled 2023-06-07 (×8): qty 1

## 2023-06-07 MED ORDER — CLOPIDOGREL BISULFATE 75 MG PO TABS
75.0000 mg | ORAL_TABLET | Freq: Every day | ORAL | Status: DC
  Administered 2023-06-07 – 2023-06-11 (×5): 75 mg via ORAL
  Filled 2023-06-07 (×6): qty 1

## 2023-06-07 NOTE — Progress Notes (Signed)
   06/07/23 2041  BiPAP/CPAP/SIPAP  BiPAP/CPAP/SIPAP Pt Type Adult  Reason BIPAP/CPAP not in use Non-compliant

## 2023-06-07 NOTE — ED Notes (Signed)
ED TO INPATIENT HANDOFF REPORT  Name/Age/Gender [name] [name] 82 y.o. adult  Code Status    Code Status Orders  (From admission, onward)           Start     Ordered   06/07/23 1341  Do not attempt resuscitation (DNR) Pre-Arrest Interventions Desired  Continuous       Question Answer Comment  If pulseless and not breathing No CPR or chest compressions.   In Pre-Arrest Conditions (Patient Has Pulse and Is Breathing) May intubate, use advanced airway interventions and cardioversion/ACLS medications if appropriate or indicated. May transfer to ICU.   Consent: Discussion documented in EHR or advanced directives reviewed      06/07/23 1340           Code Status History     Date Active Date Inactive Code Status Order ID Comments User Context   09/14/2022 1455 09/16/2022 2117 DNR [id number]  Bobette Mo, MD ED   09/14/2022 1158 09/14/2022 1455 Full Code [id number]  Bobette Mo, MD ED   07/22/2022 1656 07/26/2022 1959 DNR [id number]  Joseph Art, DO ED   05/01/2022 2137 05/10/2022 0328 Full Code [id number]  Synetta Fail, MD ED   11/21/2020 1455 11/30/2020 0115 DNR [id number]  Clydie Braun, MD ED   10/23/2020 1916 11/05/2020 2058 DNR [id number]  Frankey Shown, DO ED   09/01/2020 1719 09/07/2020 2028 DNR [id number]  [name], Areeg N, DO ED   09/06/2019 0930 09/09/2019 0025 DNR [id number]  Jonah Blue, MD ED   04/03/2018 2133 04/09/2018 1656 Full Code [id number]  [name], [place], DO ED   04/03/2018 1553 04/03/2018 2133 Full Code [id number]  Fayrene Helper, PA-C ED   09/03/2017 0902 09/03/2017 1950 Full Code [id number]  [name], MD ED   09/20/2016 1357 09/21/2016 1626 Full Code [id number]  [name], MD ED   01/31/2016 1130 02/01/2016 1625 Full Code [id number]  Runell Gess, MD Inpatient   10/19/2014 2043 10/27/2014 1717 Partial Code [id number]  Tyrone Nine, MD Inpatient   10/19/2014 1957 10/19/2014 2043 Full Code [id number]  Tyrone Nine, MD Inpatient   03/21/2014 1731  03/24/2014 2002 Full Code [id number]  Baltazar Apo, MD Inpatient   10/05/2013 0219 10/07/2013 1832 Full Code [id number]  Nani Ravens, MD Inpatient       Home/SNF/Other Home  Chief Complaint Acute respiratory failure with hypoxia (HCC) [J96.01]  Level of Care/Admitting Diagnosis ED Disposition     ED Disposition  Admit   Condition  --   Comment  Hospital Area[facility] [[id number]]  Level of Care: Telemetry [5]  Admit to tele based on following criteria: Monitor QTC interval  Admit to tele based on following criteria: Monitor for Ischemic changes  May admit patient to [facility] or [facility] if equivalent level of care is available:: No  Covid Evaluation: Asymptomatic - no recent exposure (last 10 days) testing not required  Diagnosis: Acute respiratory failure with hypoxia [facility]) [[id number]]  Admitting Physician: Bobette Mo [[id number]]  Attending Physician: Bobette Mo [[id number]]  Certification:: I certify this patient will need inpatient services for at least 2 midnights  Expected Medical Readiness: 06/09/2023          Medical History Past Medical History:  Diagnosis Date   Abscess 05/03/2022   Acute kidney injury superimposed on chronic kidney disease (HCC) 09/06/2019   Anxiety    Arthritis    RA   Carpal tunnel syndrome, bilateral  Cellulitis 05/01/2022   Chronic kidney disease    CKD stage III, absence of left kidney   CKD (chronic kidney disease) stage 3, GFR 30-59 ml/min (HCC) 09/06/2019   Congenital absence of one kidney    Pt has right kidney only   Depression    Dyslipidemia 09/06/2019   Elevated d-dimer 10/23/2020   Essential hypertension 09/06/2019   GERD (gastroesophageal reflux disease)    Headache    Heart murmur    Homicidal ideation    Hypercholesteremia    Hypertension    Mammogram abnormal 10/08/2022   MRSA bacteremia 10/29/2020   Pain in left shoulder 12/13/2020   Peripheral vascular disease (HCC)     a. s/p R external iliac stent '06 with angioplasty '13 (Dr. Carollee Massed) b. 01/2016: s/p PTA/stenting in L common iliac artery (Dr. Allyson Sabal)   Pneumonia of right lower lobe due to infectious organism 11/11/2017   Polymyalgia rheumatica (HCC)    Psoriasis    Sepsis (HCC) 07/22/2022   Sepsis due to pneumonia (HCC) 11/21/2020   Sepsis with acute hypoxic respiratory failure (HCC) 09/01/2020   Shortness of breath 10/23/2020   Sleep apnea    Tremor 09/06/2019    Allergies Allergies  Allergen Reactions   Statins    Statins Swelling, Rash and Other (See Comments)    Swelling involving tongue; also causes muscle pain    IV Location/Drains/Wounds Patient Lines/Drains/Airways Status     Active Line/Drains/Airways     Name Placement date Placement time Site Days   Peripheral IV 06/07/23 20 G Left Antecubital 06/07/23  1001  Antecubital  less than 1   Peripheral IV 06/07/23 22 G 1" Anterior;Left Forearm 06/07/23  1027  Forearm  less than 1            Labs/Imaging Results for orders placed or performed during the hospital encounter of 06/07/23 (from the past 48 hour(s))  Resp panel by RT-PCR (RSV, Flu A&B, Covid) Anterior Nasal Swab     Status: None   Collection Time: 06/07/23 10:08 AM   Specimen: Anterior Nasal Swab  Result Value Ref Range   SARS Coronavirus 2 by RT PCR NEGATIVE NEGATIVE    Comment: (NOTE) SARS-CoV-2 target nucleic acids are NOT DETECTED.  The SARS-CoV-2 RNA is generally detectable in upper respiratory specimens during the acute phase of infection. The lowest concentration of SARS-CoV-2 viral copies this assay can detect is 138 copies/mL. A negative result does not preclude SARS-Cov-2 infection and should not be used as the sole basis for treatment or other patient management decisions. A negative result may occur with  improper specimen collection/handling, submission of specimen other than nasopharyngeal swab, presence of viral mutation(s) within the areas  targeted by this assay, and inadequate number of viral copies(<138 copies/mL). A negative result must be combined with clinical observations, patient history, and epidemiological information. The expected result is Negative.  Fact Sheet for Patients:  BloggerCourse.com  Fact Sheet for Healthcare Providers:  SeriousBroker.it  This test is no t yet approved or cleared by the [place] FDA and  has been authorized for detection and/or diagnosis of SARS-CoV-2 by FDA under an Emergency Use Authorization (EUA). This EUA will remain  in effect (meaning this test can be used) for the duration of the COVID-19 declaration under Section 564(b)(1) of the Act, 21 U.S.C.section 360bbb-3(b)(1), unless the authorization is terminated  or revoked sooner.       Influenza A by PCR NEGATIVE NEGATIVE   Influenza B by PCR NEGATIVE NEGATIVE  Comment: (NOTE) The Xpert Xpress SARS-CoV-2/FLU/RSV plus assay is intended as an aid in the diagnosis of influenza from Nasopharyngeal swab specimens and should not be used as a sole basis for treatment. Nasal washings and aspirates are unacceptable for Xpert Xpress SARS-CoV-2/FLU/RSV testing.  Fact Sheet for Patients: BloggerCourse.com  Fact Sheet for Healthcare Providers: SeriousBroker.it  This test is not yet approved or cleared by the [place] FDA and has been authorized for detection and/or diagnosis of SARS-CoV-2 by FDA under an Emergency Use Authorization (EUA). This EUA will remain in effect (meaning this test can be used) for the duration of the COVID-19 declaration under Section 564(b)(1) of the Act, 21 U.S.C. section 360bbb-3(b)(1), unless the authorization is terminated or revoked.     Resp Syncytial Virus by PCR NEGATIVE NEGATIVE    Comment: (NOTE) Fact Sheet for Patients: BloggerCourse.com  Fact Sheet for  Healthcare Providers: SeriousBroker.it  This test is not yet approved or cleared by the [place] FDA and has been authorized for detection and/or diagnosis of SARS-CoV-2 by FDA under an Emergency Use Authorization (EUA). This EUA will remain in effect (meaning this test can be used) for the duration of the COVID-19 declaration under Section 564(b)(1) of the Act, 21 U.S.C. section 360bbb-3(b)(1), unless the authorization is terminated or revoked.  Performed at [facility], [address], [place], [place] [address]   Comprehensive metabolic panel     Status: Abnormal   Collection Time: 06/07/23 10:35 AM  Result Value Ref Range   Sodium 137 135 - 145 mmol/L   Potassium 3.6 3.5 - 5.1 mmol/L   Chloride 101 98 - 111 mmol/L   CO2 24 22 - 32 mmol/L   Glucose, Bld 97 70 - 99 mg/dL    Comment: Glucose reference range applies only to samples taken after fasting for at least 8 hours.   BUN 20 8 - 23 mg/dL   Creatinine, Ser 5.62 (H) 0.44 - 1.00 mg/dL   Calcium 9.0 8.9 - 13.0 mg/dL   Total Protein 7.3 6.5 - 8.1 g/dL   Albumin 3.7 3.5 - 5.0 g/dL   AST 17 15 - 41 U/L   ALT 9 0 - 44 U/L   Alkaline Phosphatase 55 38 - 126 U/L   Total Bilirubin 0.9 <1.2 mg/dL   GFR, Estimated 40 (L) >60 mL/min    Comment: (NOTE) Calculated using the CKD-EPI Creatinine Equation (2021)    Anion gap 12 5 - 15    Comment: Performed at [facility], [address] [place], [place] [address]  CBC with Differential     Status: Abnormal   Collection Time: 06/07/23 10:35 AM  Result Value Ref Range   WBC 15.3 (H) 4.0 - 10.5 K/uL   RBC 3.52 (L) 3.87 - 5.11 MIL/uL   Hemoglobin 12.4 12.0 - 15.0 g/dL   HCT 46.9 62.9 - 52.8 %   MCV 105.4 (H) 80.0 - 100.0 fL   MCH 35.2 (H) 26.0 - 34.0 pg   MCHC 33.4 30.0 - 36.0 g/dL   RDW 41.3 24.4 - 01.0 %   Platelets 116 (L) 150 - 400 K/uL   nRBC 0.0 0.0 - 0.2 %   Neutrophils Relative % 82 %   Neutro Abs 12.7  (H) 1.7 - 7.7 K/uL   Lymphocytes Relative 8 %   Lymphs Abs 1.2 0.7 - 4.0 K/uL   Monocytes Relative 8 %   Monocytes Absolute 1.1 (H) 0.1 - 1.0 K/uL   Eosinophils Relative  0 %   Eosinophils Absolute 0.0 0.0 - 0.5 K/uL   Basophils Relative 0 %   Basophils Absolute 0.0 0.0 - 0.1 K/uL   Immature Granulocytes 2 %   Abs Immature Granulocytes 0.24 (H) 0.00 - 0.07 K/uL    Comment: Performed at [facility], [address], [place], [place] [address]  Protime-INR     Status: None   Collection Time: 06/07/23 10:35 AM  Result Value Ref Range   Prothrombin Time 14.9 11.4 - 15.2 seconds   INR 1.2 0.8 - 1.2    Comment: (NOTE) INR goal varies based on device and disease states. Performed at [facility], [address] [place], [place] [address]   APTT     Status: None   Collection Time: 06/07/23 10:35 AM  Result Value Ref Range   aPTT 29 24 - 36 seconds    Comment: Performed at [facility], [address] [place], [place] [address]  Urinalysis, w/ Reflex to Culture (Infection Suspected) -Urine, Clean Catch     Status: Abnormal   Collection Time: 06/07/23 10:35 AM  Result Value Ref Range   Specimen Source URINE, CLEAN CATCH    Color, Urine YELLOW YELLOW   APPearance CLEAR CLEAR   Specific Gravity, Urine 1.018 1.005 - 1.030   pH 6.0 5.0 - 8.0   Glucose, UA NEGATIVE NEGATIVE mg/dL   Hgb urine dipstick NEGATIVE NEGATIVE   Bilirubin Urine NEGATIVE NEGATIVE   Ketones, ur 5 (A) NEGATIVE mg/dL   Protein, ur 347 (A) NEGATIVE mg/dL   Nitrite NEGATIVE NEGATIVE   Leukocytes,Ua NEGATIVE NEGATIVE   RBC / HPF 0-5 0 - 5 RBC/hpf   WBC, UA 0-5 0 - 5 WBC/hpf    Comment:        Reflex urine culture not performed if WBC <=10, OR if Squamous epithelial cells >5. If Squamous epithelial cells >5 suggest recollection.    Bacteria, UA NONE SEEN NONE SEEN   Squamous Epithelial / HPF 0-5 0 - 5 /HPF   Mucus PRESENT     Comment: Performed at [facility], [address], [place], [place] [address]  I-Stat Lactic Acid, ED     Status: None   Collection Time: 06/07/23 10:44 AM  Result Value Ref Range   Lactic Acid, Venous 1.4 0.5 - 1.9 mmol/L   CT CHEST ABDOMEN PELVIS W CONTRAST  Result Date: 06/07/2023 CLINICAL DATA:  Cough.  Weakness. EXAM: CT CHEST, ABDOMEN, AND PELVIS WITH CONTRAST TECHNIQUE: Multidetector CT imaging of the chest, abdomen and pelvis was performed following the standard protocol during bolus administration of intravenous contrast. RADIATION DOSE REDUCTION: This exam was performed according to the departmental dose-optimization program which includes automated exposure control, adjustment of the mA and/or kV according to patient size and/or use of iterative reconstruction technique. CONTRAST:  75mL OMNIPAQUE IOHEXOL 300 MG/ML  SOLN COMPARISON:  07/22/2022.  Current chest radiograph. FINDINGS: CT CHEST FINDINGS Cardiovascular: Heart normal in size and configuration. Left and right coronary artery calcifications. No pericardial effusion. Great vessels are normal in caliber. Aortic atherosclerosis without aneurysm or dissection. Atherosclerosis at the origin of the arch branch vessels. Proximally 50% narrowing at the origin of the left subclavian artery. These findings are stable. Mediastinum/Nodes: 2.1 cm heterogeneous left thyroid nodule. This underwent fine-needle aspirate on 05/25/2023. No neck base, mediastinal or hilar masses. No enlarged lymph nodes. Trachea and esophagus are unremarkable. Lungs/Pleura: Multiple focal areas of lung consolidation. These are evident in  the medial base of the right lower lobe, anteromedial aspect of the left upper lobe and base of the left lower lobe. Left upper lobe nodule centered on image 71, series 6, 9 mm. There are additional areas of linear type opacity most prominently at the bases consistent with atelectasis. No pleural effusion or pneumothorax. Musculoskeletal: No fracture  or acute finding. No bone lesion. No chest wall mass. CT ABDOMEN PELVIS FINDINGS Hepatobiliary: No focal liver abnormality is seen. No gallstones, gallbladder wall thickening, or biliary dilatation. Pancreas: Unremarkable. No pancreatic ductal dilatation or surrounding inflammatory changes. Spleen: Borderline to mildly enlarged, 13 cm from superior to inferior, unchanged. No splenic mass. Adrenals/Urinary Tract: Normal adrenal glands. Severe atrophy/scarring of the left kidney. Right kidney normal in size. No renal masses. No stones. No hydronephrosis. Normal ureters. Normal bladder. Stomach/Bowel: Normal stomach. Small bowel and colon are normal in caliber. No wall thickening. No inflammation. Vascular/Lymphatic: Aortic atherosclerosis. No enlarged abdominal or pelvic lymph nodes. Reproductive: Status post hysterectomy. No adnexal masses. Other: No abdominal wall hernia or abnormality. No abdominopelvic ascites. Musculoskeletal: No fracture or acute finding. No aggressive bone lesion. IMPRESSION: 1. Bilateral focal areas of lung consolidation consistent with multifocal pneumonia. There is also a 9 mm nodule in the left upper lobe. This may also be infectious/inflammatory in etiology, however, follow-up is recommended with repeat chest CT in 3 months. This recommendation follows the consensus statement: Guidelines for Management of Incidental Pulmonary Nodules Detected on CT Images: From the Fleischner Society 2017; Radiology 2017; 284:228-243. 2. No other evidence of an acute abnormality within the chest, abdomen or pelvis. 3. Aortic atherosclerosis. Electronically Signed   By: Amie Portland M.D.   On: 06/07/2023 12:35   DG Chest Port 1 View  Result Date: 06/07/2023 CLINICAL DATA:  Cough and progressive weakness EXAM: PORTABLE CHEST 1 VIEW COMPARISON:  04/01/2023 FINDINGS: Low volume chest with indistinct density at the bases. No edema, effusion, air bronchogram, or pneumothorax. Normal heart size and  mediastinal contours. Artifact from EKG leads. IMPRESSION: Low volume chest with atelectasis at the bases. Electronically Signed   By: Tiburcio Pea M.D.   On: 06/07/2023 10:00    Pending Labs Unresulted Labs (From admission, onward)     Start     Ordered   06/08/23 0500  CBC  Tomorrow morning,   R        06/07/23 1340   06/08/23 0500  Comprehensive metabolic panel  Tomorrow morning,   R        06/07/23 1340   06/07/23 0940  Blood Culture (routine x 2)  (Septic presentation on arrival (screening labs, nursing and treatment orders for obvious sepsis))  BLOOD CULTURE X 2,   STAT      06/07/23 0940            Vitals/Pain Today's Vitals   06/07/23 0924 06/07/23 0925 06/07/23 1259  BP: 133/80  119/60  Pulse: (!) 108  86  Resp: 19  18  Temp: 99.9 F (37.7 C)    TempSrc: Oral    SpO2: 94%  92%  Weight:  60.3 kg   Height:  4' 11.5" (1.511 m)   PainSc:  8      Isolation Precautions No active isolations  Medications Medications  lactated ringers infusion ( Intravenous New Bag/Given 06/07/23 1031)  cefTRIAXone (ROCEPHIN) 2 g in sodium chloride 0.9 % 100 mL IVPB (0 g Intravenous Stopped 06/07/23 1106)  azithromycin (ZITHROMAX) 500 mg in sodium chloride 0.9 % 250 mL IVPB (  0 mg Intravenous Stopped 06/07/23 1354)  enoxaparin (LOVENOX) injection 40 mg (has no administration in time range)  acetaminophen (TYLENOL) tablet 650 mg (has no administration in time range)    Or  acetaminophen (TYLENOL) suppository 650 mg (has no administration in time range)  ondansetron (ZOFRAN) tablet 4 mg (has no administration in time range)    Or  ondansetron (ZOFRAN) injection 4 mg (has no administration in time range)  ipratropium-albuterol (DUONEB) 0.5-2.5 (3) MG/3ML nebulizer solution 3 mL (has no administration in time range)  albuterol (PROVENTIL) (2.5 MG/3ML) 0.083% nebulizer solution 2.5 mg (has no administration in time range)  lactated ringers bolus 1,000 mL (1,000 mLs Intravenous New  Bag/Given 06/07/23 1037)  ipratropium-albuterol (DUONEB) 0.5-2.5 (3) MG/3ML nebulizer solution 3 mL (3 mLs Nebulization Given 06/07/23 0958)  acetaminophen (TYLENOL) tablet 650 mg (650 mg Oral Given 06/07/23 0958)  iohexol (OMNIPAQUE) 300 MG/ML solution 75 mL (75 mLs Intravenous Contrast Given 06/07/23 1157)    Mobility walks with person assist

## 2023-06-07 NOTE — Plan of Care (Signed)

## 2023-06-07 NOTE — H&P (Signed)
History and Physical    Patient: [name] [name] KVQ:[id number] DOB: Jun 07, 1941 DOA: 06/07/2023 DOS: the patient was seen and examined on 06/07/2023 PCP: Patient, No Pcp Per  Patient coming from: Home  Chief Complaint:  Chief Complaint  Patient presents with   Cough   HPI: [name] [name] is a 82 y.o. adult with medical history significant of abscess, history of AKI superimposed on stage III CKD, anxiety, depression, HI, rheumatoid arthritis, bilateral carpal tunnel syndrome, unspecified site cellulitis, congenital absence of right kidney, hyperlipidemia, elevated D-dimer, hypertension, GERD, headache, heart murmur, hypertension, normal mammogram, MRSA bacteremia, left shoulder pain, peripheral vascular disease history of right external iliac stent, history of left common iliac artery stenting, right lower lobe pneumonia, polymyalgia rheumatica, psoriasis, sepsis due to pneumonia, history of acute hypoxic respiratory failure, sleep apnea, tremor who presented to the emergency department with complaints of fever, fatigue, dyspnea, wheezing and cough with lower right-sided chest wall pleuritic pain.  She thinks her granddaughter may have have been sick with respiratory symptoms several days before she fell ill. No chest pain, palpitations, diaphoresis, PND, orthopnea or recent pitting edema of the lower extremities.  No abdominal pain, nausea, emesis, diarrhea, constipation, melena or hematochezia.  No flank pain, dysuria, frequency or hematuria.  No polyuria, polydipsia, polyphagia or blurred vision.   Lab work: Urinalysis showed ketones of 5 and protein of 100 mg/deciliter.  CBC showed white count 15.3 with 82% neutrophils, hemoglobin 12.4 g/dL with an MCV of 643.3 fL and platelets 116.  Normal PT, INR and PTT.  Coronavirus, influenza and RSV PCR was negative.  CMP showed a creatinine of 1.31 mg/dL, but was otherwise unremarkable.  Imaging: Portable 1 view chest radiograph showing low volume chest with  atelectasis at the bases.  Normal heart size and mediastinal contours.  CT chest/abdomen/pelvis with contrast showing bilateral focal areas of consolidation consistent with multifocal pneumonia.  There is also a 9 mm nodule in the left upper lobe.  Repeat CT chest CT recommended in 3 months.  No other evidence of an acute abnormality within the chest, abdomen or pelvis.  There is aortic atherosclerosis.  There is severe atrophy/scarring of the left kidney.  Right kidney is normal in size.  No renal masses.   ED course: Initial vital signs were temperature 99.9 F, pulse 78, respiration 19, BP 133/80 mmHg O2 sat 94% on room air.  The patient received acetaminophen 650 mg p.o. x 1, a DuoNeb and 1000 mL of LR bolus.  Review of Systems: As mentioned in the history of present illness. All other systems reviewed and are negative. Past Medical History:  Diagnosis Date   Abscess 05/03/2022   Acute kidney injury superimposed on chronic kidney disease (HCC) 09/06/2019   Anxiety    Arthritis    RA   Carpal tunnel syndrome, bilateral    Cellulitis 05/01/2022   Chronic kidney disease    CKD stage III, absence of left kidney   CKD (chronic kidney disease) stage 3, GFR 30-59 ml/min (HCC) 09/06/2019   Congenital absence of one kidney    Pt has right kidney only   Depression    Dyslipidemia 09/06/2019   Elevated d-dimer 10/23/2020   Essential hypertension 09/06/2019   GERD (gastroesophageal reflux disease)    Headache    Heart murmur    Homicidal ideation    Hypercholesteremia    Hypertension    Mammogram abnormal 10/08/2022   MRSA bacteremia 10/29/2020   Pain in left shoulder 12/13/2020   Peripheral  vascular disease (HCC)    a. s/p R external iliac stent '06 with angioplasty '13 (Dr. Carollee Massed) b. 01/2016: s/p PTA/stenting in L common iliac artery (Dr. Allyson Sabal)   Pneumonia of right lower lobe due to infectious organism 11/11/2017   Polymyalgia rheumatica (HCC)    Psoriasis    Sepsis (HCC) 07/22/2022    Sepsis due to pneumonia (HCC) 11/21/2020   Sepsis with acute hypoxic respiratory failure (HCC) 09/01/2020   Shortness of breath 10/23/2020   Sleep apnea    Tremor 09/06/2019   Past Surgical History:  Procedure Laterality Date   ABDOMINAL HYSTERECTOMY  1963   Partial,  Due to bleeding after delivery   ANTERIOR CERVICAL DECOMP/DISCECTOMY FUSION  04/19/2012   Procedure: ANTERIOR CERVICAL DECOMPRESSION/DISCECTOMY FUSION 2 LEVELS;  Surgeon: Carmela Hurt, MD;  Location: [facility];  Service: Neurosurgery;  Laterality: N/A;  Cervical four-five,Cervical five-six anterior cervical decompression with fusion plating and bonegraft possible posterior cervical decompression   APPENDECTOMY     ARTERY BIOPSY Left 09/15/2022   Procedure: BIOPSY LEFT TEMPORAL ARTERY;  Surgeon: Nada Libman, MD;  Location: [facility] OR;  Service: Vascular;  Laterality: Left;   CARPAL TUNNEL RELEASE  1990   CERVICAL FUSION  04/19/2012   EYE SURGERY     ILIAC ARTERY STENT     PERIPHERAL VASCULAR CATHETERIZATION N/A 01/31/2016   Procedure: Abdominal Aortogram;  Surgeon: Runell Gess, MD;  Location: [facility] INVASIVE CV LAB;  Service: Cardiovascular;  Laterality: N/A;   PERIPHERAL VASCULAR CATHETERIZATION Bilateral 01/31/2016   Procedure: Lower Extremity Angiography;  Surgeon: Runell Gess, MD;  Location: [facility] INVASIVE CV LAB;  Service: Cardiovascular;  Laterality: Bilateral;   PERIPHERAL VASCULAR CATHETERIZATION Left 01/31/2016   Procedure: Peripheral Vascular Intervention;  Surgeon: Runell Gess, MD;  Location: [facility] INVASIVE CV LAB;  Service: Cardiovascular;  Laterality: Left;  ILIAC   TEE WITHOUT CARDIOVERSION N/A 11/01/2020   Procedure: TRANSESOPHAGEAL ECHOCARDIOGRAM (TEE);  Surgeon: Quintella Reichert, MD;  Location: [facility] ENDOSCOPY;  Service: Cardiovascular;  Laterality: N/A;   TEE WITHOUT CARDIOVERSION N/A 11/25/2020   Procedure: TRANSESOPHAGEAL ECHOCARDIOGRAM (TEE);  Surgeon: Quintella Reichert, MD;  Location: [facility] ENDOSCOPY;   Service: Cardiovascular;  Laterality: N/A;   TONSILLECTOMY     Social History:  reports that she quit smoking about 12 years ago. Her smoking use included cigarettes. She started smoking about 52 years ago. She has a 40 pack-year smoking history. She has been exposed to tobacco smoke. She has never used smokeless tobacco. She reports that she does not drink alcohol and does not use drugs.  Allergies  Allergen Reactions   Statins    Statins Swelling, Rash and Other (See Comments)    Swelling involving tongue; also causes muscle pain    Family History  Problem Relation Age of Onset   Angina Mother    Congestive Heart Failure Mother    Heart attack Father    Cancer Sister        Lymphoma   Neuropathy Sister    Brain cancer Sister    Melanoma Brother    Congestive Heart Failure Brother    Heart disease Brother    Osteoarthritis Daughter    Colon cancer Neg Hx    Stomach cancer Neg Hx    Esophageal cancer Neg Hx     Prior to Admission medications   Medication Sig Start Date End Date Taking? Authorizing Provider  acetaminophen (TYLENOL) 500 MG tablet Take 500 mg by mouth every 8 (eight) hours as needed  for moderate pain.    [provider]  albuterol (VENTOLIN HFA) 108 (90 Base) MCG/ACT inhaler Inhale 1 puff into the lungs daily as needed for shortness of breath. 09/01/22   [provider]  amLODipine (NORVASC) 2.5 MG tablet Take 1 tablet (2.5 mg total) by mouth daily. 04/16/23   Glendale Chard, DO  busPIRone (BUSPAR) 10 MG tablet TAKE TWO TABLETS BY MOUTH THREE TIMES A DAY 03/29/23   Glendale Chard, DO  clopidogrel (PLAVIX) 75 MG tablet Take 1 tablet (75 mg total) by mouth daily. 04/16/23   Glendale Chard, DO  divalproex (DEPAKOTE) 125 MG DR tablet Take 2 tablets (250 mg total) by mouth 2 (two) times daily. 12/31/22   Valetta Close, MD  furosemide (LASIX) 20 MG tablet Take 20 mg by mouth every other day. 07/17/22   [provider]  gabapentin (NEURONTIN) 100 MG  capsule TAKE 1 CAPSULE BY MOUTH 3 TIMES A DAY 04/26/23   Glendale Chard, DO  nystatin (MYCOSTATIN/NYSTOP) powder Apply 1 Application topically 3 (three) times daily. Under the breast 03/19/23   Shitarev, Dimitry, MD  nystatin-triamcinolone ointment (MYCOLOG) Apply 1 Application topically 2 (two) times daily. Under the breasts 03/19/23   Shitarev, Dimitry, MD  pantoprazole (PROTONIX) 40 MG tablet Take 1 tablet (40 mg total) by mouth 2 (two) times daily. 01/23/23   Glendale Chard, DO  Propyl Glycol-Hydroxyethylcell (NASAL MOIST) GEL Apply after using nasal saline spray 10/30/22   Valetta Close, MD  sodium chloride (OCEAN) 0.65 % SOLN nasal spray Use twice daily to keep nose moist. 10/30/22   Valetta Close, MD  sucralfate (CARAFATE) 1 g tablet Take 1 tablet (1 g total) by mouth 4 (four) times daily -  with meals and at bedtime. Crush into powder, add water, and drink when taking 12/08/22   Bess Kinds, MD  traMADol (ULTRAM) 50 MG tablet Take 1 tablet (50 mg total) by mouth 3 (three) times daily as needed for moderate pain. 03/22/23   Glendale Chard, DO    Physical Exam: Vitals:   06/07/23 0924 06/07/23 0925 06/07/23 1259  BP: 133/80  119/60  Pulse: (!) 108  86  Resp: 19  18  Temp: 99.9 F (37.7 C)    TempSrc: Oral    SpO2: 94%  92%  Weight:  60.3 kg   Height:  4' 11.5" (1.511 m)    Physical Exam Vitals and nursing note reviewed.  Constitutional:      General: She is awake. She is not in acute distress.    Appearance: Normal appearance. She is ill-appearing.     Interventions: Nasal cannula in place.  HENT:     Head: Normocephalic.     Nose: No rhinorrhea.     Mouth/Throat:     Mouth: Mucous membranes are moist.  Eyes:     General: No scleral icterus.    Pupils: Pupils are equal, round, and reactive to light.  Neck:     Vascular: No JVD.  Cardiovascular:     Rate and Rhythm: Normal rate and regular rhythm.     Heart sounds: S1 normal and S2 normal.  Pulmonary:     Effort:  Pulmonary effort is normal. No tachypnea, accessory muscle usage or respiratory distress.     Breath sounds: Decreased breath sounds, rhonchi and rales present.  Abdominal:     General: Bowel sounds are normal. There is no distension.     Palpations: Abdomen is soft.     Tenderness: There is no  abdominal tenderness. There is no guarding.  Musculoskeletal:     Cervical back: Neck supple.     Right lower leg: No edema.     Left lower leg: No edema.  Skin:    General: Skin is warm and dry.  Neurological:     General: No focal deficit present.     Mental Status: She is alert and oriented to person, place, and time.  Psychiatric:        Mood and Affect: Mood normal.        Behavior: Behavior normal. Behavior is cooperative.     Data Reviewed:  Results are pending, will review when available. 11/25/2020 TEE IMPRESSIONS:   1. Left ventricular ejection fraction, by estimation, is 60 to 65%. The left ventricle has normal function. The left ventricle has no regional wall motion abnormalities.  2. Right ventricular systolic function is normal. The right ventricular size is normal.  3. No left atrial/left atrial appendage thrombus was detected.  4. The mitral valve is degenerative. There is a very small density on the A2 segment of the anterior MV leaflet by 3D imaging that likely represents calcified degeneration of the mitral valve leaflets. No obvious shaggy vegetations noted. Mild mitral valve regurgitation. No evidence of mitral stenosis.  5. The aortic valve is normal in structure. Aortic valve regurgitation is not visualized. No aortic stenosis is present.  6. There is Moderate (Grade III) layered plaque involving the transverse aorta, ascending aorta and descending aorta.  7. The inferior vena cava is normal in size with greater than 50% respiratory variability, suggesting right atrial pressure of 3 mmHg.  EKG: Vent. rate 102 BPM PR interval 162 ms QRS duration 94  ms QT/QTcB 332/433 ms P-R-T axes 61 58 29 Sinus tachycardia  Assessment and Plan: Principal Problem:   Acute respiratory failure with hypoxia (HCC) In the setting of:   Multifocal pneumonia Admit to telemetry/inpatient. Continue supplemental oxygen. Scheduled and as needed bronchodilators. Continue ceftriaxone 1 g IVPB daily. Continue azithromycin 500 mg IVPB daily. Check strep pneumoniae urinary antigen. Check sputum Gram stain, culture and sensitivity. Follow-up blood culture and sensitivity. Follow-up CBC and chemistry in the morning.  Active Problems:   OSA (obstructive sleep apnea) CPAP at bedtime.    Essential hypertension Continue amlodipine 2.5 mg p.o. daily. Resume furosemide in 24 to 48 hours.    Anxiety   Depression Continue buspirone 10 mg p.o. 3 times daily. Continue Depakote 250 mg p.o. twice daily.    GERD (gastroesophageal reflux disease) Continue pantoprazole 40 mg p.o. daily. Continue Carafate 4 times a day.    Glaucoma Currently not on therapy. Follow-up with ophthalmology as an outpatient.    Peripheral vascular disease (HCC) Continue clopidogrel. Allergic to statins.    CKD stage 3b, GFR 30-44 ml/min (HCC) Monitor renal function electrolytes.    Dyslipidemia Allergic to statins. Follow-up with primary care provider for other options.    Thrombocytopenia (HCC) Monitor platelet count.    Epistaxis Controlled now. The patient is on clopidogrel.     Advance Care Planning:   Code Status: Do not attempt resuscitation (DNR) PRE-ARREST INTERVENTIONS DESIRED   Consults:   Family Communication:   Severity of Illness: The appropriate patient status for this patient is INPATIENT. Inpatient status is judged to be reasonable and necessary in order to provide the required intensity of service to ensure the patient's safety. The patient's presenting symptoms, physical exam findings, and initial radiographic and laboratory data in the context of  their chronic  comorbidities is felt to place them at high risk for further clinical deterioration. Furthermore, it is not anticipated that the patient will be medically stable for discharge from the hospital within 2 midnights of admission.   * I certify that at the point of admission it is my clinical judgment that the patient will require inpatient hospital care spanning beyond 2 midnights from the point of admission due to high intensity of service, high risk for further deterioration and high frequency of surveillance required.*  Author: Bobette Mo, MD 06/07/2023 1:13 PM  For on call review [email or web address].   This document was prepared using Dragon voice recognition software and may contain some unintended transcription errors.

## 2023-06-07 NOTE — Sepsis Progress Note (Signed)
Sepsis protocol is being followed by [facility]. 

## 2023-06-07 NOTE — Plan of Care (Signed)
  Problem: Education: Goal: Knowledge of General Education information will improve Description: Including pain rating scale, medication(s)/side effects and non-pharmacologic comfort measures Outcome: Not Progressing   Problem: Health Behavior/Discharge Planning: Goal: Ability to manage health-related needs will improve Outcome: Not Progressing   Problem: Clinical Measurements: Goal: Ability to maintain clinical measurements within normal limits will improve Outcome: Not Progressing Goal: Will remain free from infection Outcome: Not Progressing Goal: Diagnostic test results will improve Outcome: Not Progressing Goal: Respiratory complications will improve Outcome: Not Progressing Goal: Cardiovascular complication will be avoided Outcome: Not Progressing   Problem: Activity: Goal: Risk for activity intolerance will decrease Outcome: Not Progressing   Problem: Nutrition: Goal: Adequate nutrition will be maintained Outcome: Not Progressing   Problem: Coping: Goal: Level of anxiety will decrease Outcome: Not Progressing   Problem: Elimination: Goal: Will not experience complications related to bowel motility Outcome: Not Progressing Goal: Will not experience complications related to urinary retention Outcome: Not Progressing   Problem: Pain Management: Goal: General experience of comfort will improve Outcome: Not Progressing   Problem: Safety: Goal: Ability to remain free from injury will improve Outcome: Not Progressing   Problem: Skin Integrity: Goal: Risk for impaired skin integrity will decrease Outcome: Not Progressing   Problem: Education: Goal: Knowledge of disease or condition will improve Outcome: Not Progressing Goal: Knowledge of the prescribed therapeutic regimen will improve Outcome: Not Progressing Goal: Individualized Educational Video(s) Outcome: Not Progressing   Problem: Activity: Goal: Ability to tolerate increased activity will  improve Outcome: Not Progressing Goal: Will verbalize the importance of balancing activity with adequate rest periods Outcome: Not Progressing   Problem: Respiratory: Goal: Ability to maintain a clear airway will improve Outcome: Not Progressing Goal: Levels of oxygenation will improve Outcome: Not Progressing Goal: Ability to maintain adequate ventilation will improve Outcome: Not Progressing   Problem: Activity: Goal: Ability to tolerate increased activity will improve Outcome: Not Progressing   Problem: Clinical Measurements: Goal: Ability to maintain a body temperature in the normal range will improve Outcome: Not Progressing   Problem: Respiratory: Goal: Ability to maintain adequate ventilation will improve Outcome: Not Progressing Goal: Ability to maintain a clear airway will improve Outcome: Not Progressing

## 2023-06-07 NOTE — ED Provider Notes (Signed)
[place] EMERGENCY DEPARTMENT AT [facility] Provider Note   CSN: [id number] Arrival date & time: 06/07/23  7829     History  Chief Complaint  Patient presents with   Cough    LEAH GATT is a 82 y.o. adult.  82 year old female with past medical history of hyperlipidemia, chronic kidney disease, and peripheral vascular disease presenting to the emergency department today with fever and cough.  The patient states she started feeling unwell yesterday.  She reports that she has had a productive cough of yellow sputum.  She states that she has been having some pain mostly on the right flank area mostly with coughing.  She has had some shortness of breath with this as well.  Denies any associated nausea or vomiting.  She reports feeling generally weak as well as some myalgias.  She came to the ER today for further evaluation regarding this.  She denies any hemoptysis.  Denies a history of DVT or pulmonary embolism, recent surgeries, recent travel.   Cough Associated symptoms: fever and shortness of breath        Home Medications Prior to Admission medications   Medication Sig Start Date End Date Taking? Authorizing Provider  acetaminophen (TYLENOL) 500 MG tablet Take 500 mg by mouth every 8 (eight) hours as needed for moderate pain.   Yes [provider]  albuterol (VENTOLIN HFA) 108 (90 Base) MCG/ACT inhaler Inhale 1 puff into the lungs daily as needed for shortness of breath. 09/01/22  Yes [provider]  amLODipine (NORVASC) 2.5 MG tablet Take 1 tablet (2.5 mg total) by mouth daily. 04/16/23  Yes Glendale Chard, DO  busPIRone (BUSPAR) 10 MG tablet TAKE TWO TABLETS BY MOUTH THREE TIMES A DAY Patient taking differently: Take 10 mg by mouth 3 (three) times daily. 03/29/23  Yes Glendale Chard, DO  clopidogrel (PLAVIX) 75 MG tablet Take 1 tablet (75 mg total) by mouth daily. 04/16/23  Yes Glendale Chard, DO  divalproex (DEPAKOTE) 125 MG DR tablet Take 2 tablets  (250 mg total) by mouth 2 (two) times daily. 12/31/22  Yes Valetta Close, MD  furosemide (LASIX) 20 MG tablet Take 20 mg by mouth every other day. 07/17/22  Yes [provider]  gabapentin (NEURONTIN) 100 MG capsule TAKE 1 CAPSULE BY MOUTH 3 TIMES A DAY 04/26/23  Yes Glendale Chard, DO  nystatin (MYCOSTATIN/NYSTOP) powder Apply 1 Application topically 3 (three) times daily. Under the breast 03/19/23  Yes Shitarev, Dimitry, MD  nystatin-triamcinolone ointment (MYCOLOG) Apply 1 Application topically 2 (two) times daily. Under the breasts 03/19/23  Yes Shitarev, Dimitry, MD  pantoprazole (PROTONIX) 40 MG tablet Take 1 tablet (40 mg total) by mouth 2 (two) times daily. 01/23/23  Yes Glendale Chard, DO  Propyl Glycol-Hydroxyethylcell (NASAL MOIST) GEL Apply after using nasal saline spray 10/30/22  Yes Valetta Close, MD  sodium chloride (OCEAN) 0.65 % SOLN nasal spray Use twice daily to keep nose moist. 10/30/22  Yes Valetta Close, MD  sucralfate (CARAFATE) 1 g tablet Take 1 tablet (1 g total) by mouth 4 (four) times daily -  with meals and at bedtime. Crush into powder, add water, and drink when taking 12/08/22  Yes Sowell, Apolinar Junes, MD  traMADol (ULTRAM) 50 MG tablet Take 1 tablet (50 mg total) by mouth 3 (three) times daily as needed for moderate pain. 03/22/23  Yes Glendale Chard, DO      Allergies    Statins and Statins    Review of Systems  Review of Systems  Constitutional:  Positive for fever.  Respiratory:  Positive for cough and shortness of breath.   All other systems reviewed and are negative.   Physical Exam Updated Vital Signs BP (!) 124/58 (BP Location: Right Arm)   Pulse 75   Temp 97.8 F (36.6 C) (Oral)   Resp 16   Ht 4' 11.5" (1.511 m)   Wt 60.3 kg   SpO2 96%   BMI 26.41 kg/m  Physical Exam Vitals and nursing note reviewed.   Gen: Mild conversational dyspnea noted Eyes: PERRL, EOMI HEENT: no oropharyngeal swelling Neck: trachea midline Resp: Coarse breath  sounds at right and left lung bases Card: Tachycardic, no murmurs, rubs, or gallops Abd: nontender, nondistended Extremities: no calf tenderness, no edema Vascular: 2+ radial pulses bilaterally, 2+ DP pulses bilaterally Skin: no rashes Psyc: acting appropriately   ED Results / Procedures / Treatments   Labs (all labs ordered are listed, but only abnormal results are displayed) Labs Reviewed  COMPREHENSIVE METABOLIC PANEL - Abnormal; Notable for the following components:      Result Value   Creatinine, Ser 1.31 (*)    GFR, Estimated 40 (*)    All other components within normal limits  CBC WITH DIFFERENTIAL/PLATELET - Abnormal; Notable for the following components:   WBC 15.3 (*)    RBC 3.52 (*)    MCV 105.4 (*)    MCH 35.2 (*)    Platelets 116 (*)    Neutro Abs 12.7 (*)    Monocytes Absolute 1.1 (*)    Abs Immature Granulocytes 0.24 (*)    All other components within normal limits  URINALYSIS, W/ REFLEX TO CULTURE (INFECTION SUSPECTED) - Abnormal; Notable for the following components:   Ketones, ur 5 (*)    Protein, ur 100 (*)    All other components within normal limits  CBC - Abnormal; Notable for the following components:   RBC 3.01 (*)    Hemoglobin 10.6 (*)    HCT 32.1 (*)    MCV 106.6 (*)    MCH 35.2 (*)    Platelets 106 (*)    All other components within normal limits  COMPREHENSIVE METABOLIC PANEL - Abnormal; Notable for the following components:   Glucose, Bld 115 (*)    Calcium 8.4 (*)    Total Protein 5.9 (*)    Albumin 2.9 (*)    AST 12 (*)    All other components within normal limits  RESP PANEL BY RT-PCR (RSV, FLU A&B, COVID)  RVPGX2  CULTURE, BLOOD (ROUTINE X 2)  CULTURE, BLOOD (ROUTINE X 2)  MRSA NEXT GEN BY PCR, NASAL  PROTIME-INR  APTT  MAGNESIUM  PHOSPHORUS  I-STAT CG4 LACTIC ACID, ED    EKG EKG Interpretation Date/Time:  Thursday June 07 2023 09:30:16 EST Ventricular Rate:  102 PR Interval:  162 QRS Duration:  94 QT  Interval:  332 QTC Calculation: 433 R Axis:   58  Text Interpretation: Sinus tachycardia Confirmed by Beckey Downing [phone]) on 06/07/2023 9:46:45 AM  Radiology CT CHEST ABDOMEN PELVIS W CONTRAST  Result Date: 06/07/2023 CLINICAL DATA:  Cough.  Weakness. EXAM: CT CHEST, ABDOMEN, AND PELVIS WITH CONTRAST TECHNIQUE: Multidetector CT imaging of the chest, abdomen and pelvis was performed following the standard protocol during bolus administration of intravenous contrast. RADIATION DOSE REDUCTION: This exam was performed according to the departmental dose-optimization program which includes automated exposure control, adjustment of the mA and/or kV according to patient size and/or use of iterative reconstruction  technique. CONTRAST:  75mL OMNIPAQUE IOHEXOL 300 MG/ML  SOLN COMPARISON:  07/22/2022.  Current chest radiograph. FINDINGS: CT CHEST FINDINGS Cardiovascular: Heart normal in size and configuration. Left and right coronary artery calcifications. No pericardial effusion. Great vessels are normal in caliber. Aortic atherosclerosis without aneurysm or dissection. Atherosclerosis at the origin of the arch branch vessels. Proximally 50% narrowing at the origin of the left subclavian artery. These findings are stable. Mediastinum/Nodes: 2.1 cm heterogeneous left thyroid nodule. This underwent fine-needle aspirate on 05/25/2023. No neck base, mediastinal or hilar masses. No enlarged lymph nodes. Trachea and esophagus are unremarkable. Lungs/Pleura: Multiple focal areas of lung consolidation. These are evident in the medial base of the right lower lobe, anteromedial aspect of the left upper lobe and base of the left lower lobe. Left upper lobe nodule centered on image 71, series 6, 9 mm. There are additional areas of linear type opacity most prominently at the bases consistent with atelectasis. No pleural effusion or pneumothorax. Musculoskeletal: No fracture or acute finding. No bone lesion. No chest wall mass. CT  ABDOMEN PELVIS FINDINGS Hepatobiliary: No focal liver abnormality is seen. No gallstones, gallbladder wall thickening, or biliary dilatation. Pancreas: Unremarkable. No pancreatic ductal dilatation or surrounding inflammatory changes. Spleen: Borderline to mildly enlarged, 13 cm from superior to inferior, unchanged. No splenic mass. Adrenals/Urinary Tract: Normal adrenal glands. Severe atrophy/scarring of the left kidney. Right kidney normal in size. No renal masses. No stones. No hydronephrosis. Normal ureters. Normal bladder. Stomach/Bowel: Normal stomach. Small bowel and colon are normal in caliber. No wall thickening. No inflammation. Vascular/Lymphatic: Aortic atherosclerosis. No enlarged abdominal or pelvic lymph nodes. Reproductive: Status post hysterectomy. No adnexal masses. Other: No abdominal wall hernia or abnormality. No abdominopelvic ascites. Musculoskeletal: No fracture or acute finding. No aggressive bone lesion. IMPRESSION: 1. Bilateral focal areas of lung consolidation consistent with multifocal pneumonia. There is also a 9 mm nodule in the left upper lobe. This may also be infectious/inflammatory in etiology, however, follow-up is recommended with repeat chest CT in 3 months. This recommendation follows the consensus statement: Guidelines for Management of Incidental Pulmonary Nodules Detected on CT Images: From the Fleischner Society 2017; Radiology 2017; 284:228-243. 2. No other evidence of an acute abnormality within the chest, abdomen or pelvis. 3. Aortic atherosclerosis. Electronically Signed   By: Amie Portland M.D.   On: 06/07/2023 12:35   DG Chest Port 1 View  Result Date: 06/07/2023 CLINICAL DATA:  Cough and progressive weakness EXAM: PORTABLE CHEST 1 VIEW COMPARISON:  04/01/2023 FINDINGS: Low volume chest with indistinct density at the bases. No edema, effusion, air bronchogram, or pneumothorax. Normal heart size and mediastinal contours. Artifact from EKG leads. IMPRESSION: Low  volume chest with atelectasis at the bases. Electronically Signed   By: Tiburcio Pea M.D.   On: 06/07/2023 10:00    Procedures Procedures    Medications Ordered in ED Medications  lactated ringers infusion (0 mLs Intravenous Stopped 06/08/23 0548)  cefTRIAXone (ROCEPHIN) 2 g in sodium chloride 0.9 % 100 mL IVPB (0 g Intravenous Stopped 06/07/23 1106)  azithromycin (ZITHROMAX) 500 mg in sodium chloride 0.9 % 250 mL IVPB (0 mg Intravenous Stopped 06/07/23 1354)  acetaminophen (TYLENOL) tablet 650 mg (has no administration in time range)    Or  acetaminophen (TYLENOL) suppository 650 mg (has no administration in time range)  ondansetron (ZOFRAN) tablet 4 mg (has no administration in time range)    Or  ondansetron (ZOFRAN) injection 4 mg (has no administration in time range)  ipratropium-albuterol (  DUONEB) 0.5-2.5 (3) MG/3ML nebulizer solution 3 mL (3 mLs Nebulization Given 06/08/23 0612)  albuterol (PROVENTIL) (2.5 MG/3ML) 0.083% nebulizer solution 2.5 mg (has no administration in time range)  amLODipine (NORVASC) tablet 2.5 mg (has no administration in time range)  busPIRone (BUSPAR) tablet 10 mg (10 mg Oral Given 06/07/23 2256)  clopidogrel (PLAVIX) tablet 75 mg (75 mg Oral Given 06/07/23 1648)  divalproex (DEPAKOTE) DR tablet 250 mg (250 mg Oral Given 06/07/23 2256)  gabapentin (NEURONTIN) capsule 100 mg (100 mg Oral Given 06/07/23 2256)  nystatin-triamcinolone ointment (MYCOLOG) 1 Application (1 Application Topical Not Given 06/07/23 2257)  pantoprazole (PROTONIX) EC tablet 40 mg (40 mg Oral Given 06/07/23 2256)  sucralfate (CARAFATE) tablet 1 g (1 g Oral Given 06/08/23 0801)  traMADol (ULTRAM) tablet 50 mg (50 mg Oral Given 06/07/23 1649)  enoxaparin (LOVENOX) injection 30 mg (30 mg Subcutaneous Given 06/07/23 2256)  ALPRAZolam (XANAX) tablet 0.25 mg (has no administration in time range)  sodium chloride (OCEAN) 0.65 % nasal spray 1 spray (has no administration in time range)   lactated ringers bolus 1,000 mL (0 mLs Intravenous Stopped 06/07/23 1501)  ipratropium-albuterol (DUONEB) 0.5-2.5 (3) MG/3ML nebulizer solution 3 mL (3 mLs Nebulization Given 06/07/23 0958)  acetaminophen (TYLENOL) tablet 650 mg (650 mg Oral Given 06/07/23 0958)  iohexol (OMNIPAQUE) 300 MG/ML solution 75 mL (75 mLs Intravenous Contrast Given 06/07/23 1157)  pantoprazole (PROTONIX) injection 40 mg (40 mg Intravenous Given 06/07/23 1651)  methylPREDNISolone sodium succinate (SOLU-MEDROL) 40 mg/mL injection 40 mg (40 mg Intravenous Given 06/07/23 1652)  potassium chloride SA (KLOR-CON M) CR tablet 40 mEq (40 mEq Oral Given 06/07/23 1648)  magnesium sulfate IVPB 2 g 50 mL (2 g Intravenous New Bag/Given 06/07/23 1706)    ED Course/ Medical Decision Making/ A&P                                 Medical Decision Making 82 year old female with past medical history of hypertension, peripheral vascular disease, and chronic kidney disease presenting to the emergency department today with cough and shortness of breath with tachycardia on arrival concerning for possible sepsis and community-acquired pneumonia.  I will initiate a sepsis workup on the patient.  Will give her Rocephin and azithromycin.  I will give patient a DuoNeb here and see if this helps with her symptoms.  She is requiring some oxygen.  Her pulse ox on room air is 88%.  She was placed on 1 L nasal cannula with improvement to the mid 90s.  I will reevaluate for ultimate disposition.  The patient is given 1 L of IV fluids.  Will hold off on the full 30 mL/kg fluid bolus as the patient has stable blood pressures here on arrival.  Will consider further fluid resuscitation if her lactic acid is elevated.  The patient's x-ray is unremarkable.  I did perform a CT scan and this does show multifocal pneumonia.  The patient is requiring oxygen.  Calls placed hospital service for admission.  Amount and/or Complexity of Data Reviewed Labs:  ordered. Radiology: ordered. ECG/medicine tests: ordered.  Risk OTC drugs. Prescription drug management. Decision regarding hospitalization.           Final Clinical Impression(s) / ED Diagnoses Final diagnoses:  Multifocal pneumonia  Hypoxia    Rx / DC Orders ED Discharge Orders     None         Durwin Glaze, MD 06/08/23 [phone]

## 2023-06-07 NOTE — ED Triage Notes (Signed)
Per EMS, pt is coming from home. Apprx 3x days ago pt developed a cough, and progressively becoming weaker. Pt also endorses right sided flank pain with coughing.

## 2023-06-08 DIAGNOSIS — N1832 Chronic kidney disease, stage 3b: Secondary | ICD-10-CM

## 2023-06-08 DIAGNOSIS — I10 Essential (primary) hypertension: Secondary | ICD-10-CM

## 2023-06-08 DIAGNOSIS — F32A Depression, unspecified: Secondary | ICD-10-CM

## 2023-06-08 DIAGNOSIS — J9601 Acute respiratory failure with hypoxia: Secondary | ICD-10-CM | POA: Diagnosis not present

## 2023-06-08 DIAGNOSIS — F419 Anxiety disorder, unspecified: Secondary | ICD-10-CM

## 2023-06-08 DIAGNOSIS — E785 Hyperlipidemia, unspecified: Secondary | ICD-10-CM

## 2023-06-08 DIAGNOSIS — J189 Pneumonia, unspecified organism: Secondary | ICD-10-CM | POA: Diagnosis not present

## 2023-06-08 LAB — CBC
HCT: 32.1 % — ABNORMAL LOW (ref 36.0–46.0)
Hemoglobin: 10.6 g/dL — ABNORMAL LOW (ref 12.0–15.0)
MCH: 35.2 pg — ABNORMAL HIGH (ref 26.0–34.0)
MCHC: 33 g/dL (ref 30.0–36.0)
MCV: 106.6 fL — ABNORMAL HIGH (ref 80.0–100.0)
Platelets: 106 10*3/uL — ABNORMAL LOW (ref 150–400)
RBC: 3.01 MIL/uL — ABNORMAL LOW (ref 3.87–5.11)
RDW: 12.9 % (ref 11.5–15.5)
WBC: 9.5 10*3/uL (ref 4.0–10.5)
nRBC: 0 % (ref 0.0–0.2)

## 2023-06-08 LAB — COMPREHENSIVE METABOLIC PANEL
ALT: 8 U/L (ref 0–44)
AST: 12 U/L — ABNORMAL LOW (ref 15–41)
Albumin: 2.9 g/dL — ABNORMAL LOW (ref 3.5–5.0)
Alkaline Phosphatase: 48 U/L (ref 38–126)
Anion gap: 8 (ref 5–15)
BUN: 16 mg/dL (ref 8–23)
CO2: 24 mmol/L (ref 22–32)
Calcium: 8.4 mg/dL — ABNORMAL LOW (ref 8.9–10.3)
Chloride: 105 mmol/L (ref 98–111)
Creatinine, Ser: 0.82 mg/dL (ref 0.44–1.00)
GFR, Estimated: >60 mL/min (ref >60)
Glucose, Bld: 115 mg/dL — ABNORMAL HIGH (ref 70–99)
Potassium: 4.1 mmol/L (ref 3.5–5.1)
Sodium: 137 mmol/L (ref 135–145)
Total Bilirubin: 0.6 mg/dL (ref <1.2)
Total Protein: 5.9 g/dL — ABNORMAL LOW (ref 6.5–8.1)

## 2023-06-08 LAB — MRSA NEXT GEN BY PCR, NASAL: MRSA by PCR Next Gen: NOT DETECTED

## 2023-06-08 MED ORDER — ENSURE ENLIVE PO LIQD
237.0000 mL | Freq: Two times a day (BID) | ORAL | Status: DC
  Administered 2023-06-08 – 2023-06-11 (×7): 237 mL via ORAL

## 2023-06-08 MED ORDER — LOPERAMIDE HCL 2 MG PO CAPS
2.0000 mg | ORAL_CAPSULE | ORAL | Status: DC | PRN
  Administered 2023-06-08 – 2023-06-09 (×3): 2 mg via ORAL
  Filled 2023-06-08 (×3): qty 1

## 2023-06-08 MED ORDER — ENOXAPARIN SODIUM 40 MG/0.4ML IJ SOSY
40.0000 mg | PREFILLED_SYRINGE | INTRAMUSCULAR | Status: DC
  Administered 2023-06-08 – 2023-06-10 (×3): 40 mg via SUBCUTANEOUS
  Filled 2023-06-08 (×3): qty 0.4

## 2023-06-08 MED ORDER — AZITHROMYCIN 250 MG PO TABS
500.0000 mg | ORAL_TABLET | Freq: Every day | ORAL | Status: AC
  Administered 2023-06-08 – 2023-06-11 (×4): 500 mg via ORAL
  Filled 2023-06-08 (×4): qty 2

## 2023-06-08 NOTE — Assessment & Plan Note (Signed)
Resolved

## 2023-06-08 NOTE — Assessment & Plan Note (Signed)
Intolerant of statins

## 2023-06-08 NOTE — Progress Notes (Signed)
SATURATION QUALIFICATIONS: (This note is used to comply with regulatory documentation for home oxygen)  Patient Saturations on Room Air at Rest = 98%  Patient Saturations on Room Air while Ambulating = 97%  Patient Saturations on 0 Liters of oxygen while Ambulating = 97%  Please briefly explain why patient needs home oxygen: 

## 2023-06-08 NOTE — Assessment & Plan Note (Signed)
Chronic.

## 2023-06-08 NOTE — Assessment & Plan Note (Signed)
Intolerant of CPAP. ?

## 2023-06-08 NOTE — Progress Notes (Signed)
  Progress Note   Patient: [name] [name] [id number]:[id number] DOB: [birth year] DOA: 06/07/2023     1 DOS: the patient was seen and examined on 06/08/2023 at 10:50AM      Brief hospital course: 82 y.o. F with HTN, PVD s/p iliac stent, PMR, CKD IIIb solitary kidney baseline Cr 0.8-1.3, hx MRSA bacteremia, HLD, OSA not tolerant of CPAP, RA not on DMARD who presented with cough, dyspnea, wheezing, pleuritic pain and fever.  In the ER, CXR showed multifocal pneumonia and she was hypoxic, admitted for pneumonia.  COVID/RSV/Flu negative.     Assessment and Plan: * Acute respiratory failure with hypoxia (HCC)    Multifocal pneumonia PSI 93, risk class IV - Continue Rocephin, azithromycin - Continue pulmonary toilet - Wean O2 as able    Thrombocytopenia (HCC) Resolved  Dyslipidemia Intolerant of statins  CKD stage 3b, GFR 30-44 ml/min (HCC) Cr improved  Epistaxis Resolved  Peripheral vascular disease (HCC) Statin intolerant - Continue amlodipine, Plavix  OSA (obstructive sleep apnea) Intolerant of CPAP  Depression - Continue Buspar, Depakote  GERD (gastroesophageal reflux disease) - Continue PPI  Anxiety - Continue Xanax, Buspar, Depakote  Essential hypertension - Continue amlodipine - Hold furosemide          Subjective: Patient still quite weak, short of breath.  Debilitated.  Has not gotten out of bed yet, short of breath with exertion.  Still wheezing.  No confusion, no further fever.     Physical Exam: BP (!) 141/55 (BP Location: Right Arm)   Pulse 84   Temp 98.1 F (36.7 C) (Oral)   Resp 18   Ht 4' 11.5" (1.511 m)   Wt 60.3 kg   SpO2 99%   BMI 26.41 kg/m   Elderly adult female, appears quite debilitated, lying in bed, no acute distress RRR, no murmurs, no peripheral edema Respiratory rate seems increased with exertion, there is no rales, but she has scant wheezing bilaterally Abdomen soft without tenderness palpation or guarding, no ascites or  distention Attention normal, affect normal, judgment and insight appear normal   Data Reviewed: Basic metabolic panel unremarkable LFTs normal CBC shows mild anemia  Family Communication: 8 at the bedside    Disposition: Status is: Inpatient Patient is still requiring oxygen, will continue pulmonary toilet for this high risk pneumonia, continue antibiotics  Hopefully home in the next 24 to 48 hours, possibly will need SNF        Author: Alberteen Sam, MD 06/08/2023 3:27 PM  For on call review [email or web address].

## 2023-06-08 NOTE — Assessment & Plan Note (Signed)
Continue PPI ?

## 2023-06-08 NOTE — Assessment & Plan Note (Signed)
>>  ASSESSMENT AND PLAN FOR DEPRESSION WRITTEN ON 06/08/2023  3:26 PM BY DANFORD, CHRISTOPHER P, MD  - Continue Buspar , Depakote

## 2023-06-08 NOTE — Assessment & Plan Note (Signed)
-   Continue Buspar, Depakote

## 2023-06-08 NOTE — Hospital Course (Signed)
82 y.o. F with HTN, PVD s/p iliac stent, PMR, CKD IIIb solitary kidney baseline Cr 0.8-1.3, hx MRSA bacteremia, HLD, OSA not tolerant of CPAP, RA not on DMARD who presented with cough, dyspnea, wheezing, pleuritic pain and fever.  In the ER, CXR showed multifocal pneumonia and she was hypoxic, admitted for pneumonia.  COVID/RSV/Flu negative.

## 2023-06-08 NOTE — Progress Notes (Signed)
   06/08/23 1337  TOC Brief Assessment  Insurance and Status Reviewed  Patient has primary care physician Yes  Home environment has been reviewed Mobile home  Prior level of function: Independent  Prior/Current Home Services No current home services  Social Determinants of Health Reivew SDOH reviewed no interventions necessary  Readmission risk has been reviewed Yes  Transition of care needs no transition of care needs at this time

## 2023-06-08 NOTE — Progress Notes (Signed)
   06/08/23 1928  BiPAP/CPAP/SIPAP  BiPAP/CPAP/SIPAP Pt Type Adult  Reason BIPAP/CPAP not in use Non-compliant

## 2023-06-08 NOTE — Assessment & Plan Note (Signed)
Cr improved.

## 2023-06-08 NOTE — Progress Notes (Signed)
PT Cancellation Note  Patient Details Name: [name] [name] MRN: [id number] DOB: 26-Jun-1941   Cancelled Treatment:    Reason Eval/Treat Not Completed: Other (comment)  Pt reports "nerves tore up" right now (upset over losing dtr recently).  RN reports recently gave xanax.  Will f/u as able.  Anise Salvo, PT Acute Rehab [facility] [phone]  Rayetta Humphrey 06/08/2023, 4:06 PM

## 2023-06-08 NOTE — Assessment & Plan Note (Signed)
Statin intolerant - Continue amlodipine, Plavix

## 2023-06-08 NOTE — Assessment & Plan Note (Signed)
PSI 93, risk class IV - Continue Rocephin, azithromycin - Continue pulmonary toilet - Wean O2 as able

## 2023-06-08 NOTE — Assessment & Plan Note (Signed)
-   Continue Xanax, Buspar, Depakote

## 2023-06-08 NOTE — Plan of Care (Signed)
  Problem: Education: Goal: Knowledge of General Education information will improve Description: Including pain rating scale, medication(s)/side effects and non-pharmacologic comfort measures Outcome: Progressing   Problem: Clinical Measurements: Goal: Will remain free from infection Outcome: Progressing Goal: Respiratory complications will improve Outcome: Progressing Goal: Cardiovascular complication will be avoided Outcome: Progressing   Problem: Coping: Goal: Level of anxiety will decrease Outcome: Progressing   Problem: Elimination: Goal: Will not experience complications related to bowel motility Outcome: Progressing   Problem: Pain Management: Goal: General experience of comfort will improve Outcome: Progressing   Problem: Safety: Goal: Ability to remain free from injury will improve Outcome: Progressing   Problem: Education: Goal: Knowledge of disease or condition will improve Outcome: Progressing Goal: Individualized Educational Video(s) Outcome: Progressing   Problem: Respiratory: Goal: Levels of oxygenation will improve Outcome: Progressing   Problem: Clinical Measurements: Goal: Ability to maintain a body temperature in the normal range will improve Outcome: Progressing   Problem: Respiratory: Goal: Ability to maintain adequate ventilation will improve Outcome: Progressing Goal: Ability to maintain a clear airway will improve Outcome: Progressing

## 2023-06-09 DIAGNOSIS — J9601 Acute respiratory failure with hypoxia: Secondary | ICD-10-CM | POA: Diagnosis not present

## 2023-06-09 DIAGNOSIS — J189 Pneumonia, unspecified organism: Secondary | ICD-10-CM | POA: Diagnosis not present

## 2023-06-09 DIAGNOSIS — F419 Anxiety disorder, unspecified: Secondary | ICD-10-CM | POA: Diagnosis not present

## 2023-06-09 DIAGNOSIS — N1832 Chronic kidney disease, stage 3b: Secondary | ICD-10-CM | POA: Diagnosis not present

## 2023-06-09 LAB — BASIC METABOLIC PANEL
Anion gap: 9 (ref 5–15)
BUN: 21 mg/dL (ref 8–23)
CO2: 26 mmol/L (ref 22–32)
Calcium: 8.7 mg/dL — ABNORMAL LOW (ref 8.9–10.3)
Chloride: 106 mmol/L (ref 98–111)
Creatinine, Ser: 1.09 mg/dL — ABNORMAL HIGH (ref 0.44–1.00)
GFR, Estimated: 50 mL/min — ABNORMAL LOW (ref >60)
Glucose, Bld: 84 mg/dL (ref 70–99)
Potassium: 3.7 mmol/L (ref 3.5–5.1)
Sodium: 141 mmol/L (ref 135–145)

## 2023-06-09 LAB — CBC
HCT: 33.1 % — ABNORMAL LOW (ref 36.0–46.0)
Hemoglobin: 10.8 g/dL — ABNORMAL LOW (ref 12.0–15.0)
MCH: 35.4 pg — ABNORMAL HIGH (ref 26.0–34.0)
MCHC: 32.6 g/dL (ref 30.0–36.0)
MCV: 108.5 fL — ABNORMAL HIGH (ref 80.0–100.0)
Platelets: 112 10*3/uL — ABNORMAL LOW (ref 150–400)
RBC: 3.05 MIL/uL — ABNORMAL LOW (ref 3.87–5.11)
RDW: 13.4 % (ref 11.5–15.5)
WBC: 6.6 10*3/uL (ref 4.0–10.5)
nRBC: 0 % (ref 0.0–0.2)

## 2023-06-09 MED ORDER — IPRATROPIUM-ALBUTEROL 0.5-2.5 (3) MG/3ML IN SOLN
3.0000 mL | Freq: Two times a day (BID) | RESPIRATORY_TRACT | Status: DC
  Administered 2023-06-09 – 2023-06-11 (×3): 3 mL via RESPIRATORY_TRACT
  Filled 2023-06-09 (×4): qty 3

## 2023-06-09 NOTE — Evaluation (Signed)
Physical Therapy Evaluation Patient Details Name: [name] [name] MRN: [id number] DOB: December 31, 1940 Today's Date: 06/09/2023  History of Present Illness  Pt is an 82 y.o. female presenting with cough, dyspnea, wheezing, pleuritic pain and fever, admitted for multifocal pneumonia. PHMx:  AKI superimposed on stage III CKD, anxiety, depression, RA, bilateral carpal tunnel syndrome, congenital absence of right kidney, hyperlipidemia, elevated D-dimer, GERD, headache, heart murmur, HTN, MRSA bacteremia, left shoulder pain, PVD history of right external iliac stent, right lower lobe PNA, polymyalgia rheumatica, psoriasis, sepsis due to pneumonia, history of acute hypoxic respiratory failure, sleep apnea, tremor  Pt is an 82 y.o. female presenting with above HPI resulting in the deficits listed below (see PT Problem List). Pt lives alone and has HH aide that comes 3x/week (at times more often if needed) and has neighbors that check on her daily. HH aide assist with ADLs and pt typically independent with mobility with use of RW. Pt performed transfers with CGA and limited distance ambulation of ~41ft in room with MIN A for stability due to tremors. Pt will benefit from skilled PT to maximize functional mobility to increase independence. She will continue to benefit from acute PT with follow up HHPT.    Clinical Impression          If plan is discharge home, recommend the following: A little help with walking and/or transfers;A little help with bathing/dressing/bathroom;Help with stairs or ramp for entrance;Assistance with cooking/housework;Assist for transportation   Can travel by private vehicle        Equipment Recommendations None recommended by PT (pt owns RW)  Recommendations for Other Services       Functional Status Assessment Patient has had a recent decline in their functional status and demonstrates the ability to make significant improvements in function in a reasonable and predictable amount  of time.     Precautions / Restrictions Precautions Precautions: Fall Precaution Comments: history of tremors Restrictions Weight Bearing Restrictions: No      Mobility  Bed Mobility Overal bed mobility: Modified Independent             General bed mobility comments: increased time, HOB elevated 27deg.    Transfers Overall transfer level: Needs assistance Equipment used: Rolling walker (2 wheels) Transfers: Sit to/from Stand, Bed to chair/wheelchair/BSC Sit to Stand: Contact guard assist   Step pivot transfers: Contact guard assist       General transfer comment: increased time to steady in standing due to tremors.    Ambulation/Gait Ambulation/Gait assistance: Min assist Gait Distance (Feet): 5 Feet Assistive device: Rolling walker (2 wheels) Gait Pattern/deviations: Step-to pattern, Decreased stride length, Narrow base of support       General Gait Details: tremors requiring up to MIN A for stability, otherwise CGA.  Stairs            Wheelchair Mobility     Tilt Bed    Modified Rankin (Stroke Patients Only)       Balance Overall balance assessment: Needs assistance Sitting-balance support: Feet supported, No upper extremity supported Sitting balance-Leahy Scale: Good     Standing balance support: Bilateral upper extremity supported, During functional activity Standing balance-Leahy Scale: Fair Standing balance comment: able to perform static standing and pull down underwear for toileting                             Pertinent Vitals/Pain Pain Assessment Pain Assessment: 0-10 Pain Score: 9  Pain Location:  B LEs and LB Pain Descriptors / Indicators: Aching, Discomfort, Sore Pain Intervention(s): Limited activity within patient's tolerance, Monitored during session, Repositioned (RN aware and to adminster meds at end of sessoin)    Home Living Family/patient expects to be discharged to:: Private residence Living  Arrangements: Alone Available Help at Discharge: Personal care attendant;Neighbor;Available PRN/intermittently Type of Home: Mobile home Home Access: Ramped entrance       Home Layout: One level Home Equipment: Rollator (4 wheels);BSC/3in1;Shower seat;Wheelchair - manual Additional Comments: has HH aide 3x/week 930am-6pm some days. States that she can call her "anytime day or night". Neighbors come by and check in daily.    Prior Function Prior Level of Function : Needs assist       Physical Assist : Mobility (physical);ADLs (physical) Mobility (physical): Transfers;Gait ADLs (physical): Bathing;Grooming Mobility Comments: rollator and RW interchangably ADLs Comments: does most of dressing independently, except for bra. States that aide helps her with "everything"     Extremity/Trunk Assessment   Upper Extremity Assessment Upper Extremity Assessment: Defer to OT evaluation (tremors)    Lower Extremity Assessment Lower Extremity Assessment: Generalized weakness    Cervical / Trunk Assessment Cervical / Trunk Assessment: Normal  Communication   Communication Communication: No apparent difficulties  Cognition Arousal: Alert Behavior During Therapy: WFL for tasks assessed/performed Overall Cognitive Status: Within Functional Limits for tasks assessed                                          General Comments      Exercises     Assessment/Plan    PT Assessment Patient needs continued PT services  PT Problem List Decreased strength;Decreased activity tolerance;Decreased balance;Decreased mobility;Pain       PT Treatment Interventions DME instruction;Gait training;Stair training;Functional mobility training;Therapeutic activities;Neuromuscular re-education;Balance training;Patient/family education    PT Goals (Current goals can be found in the Care Plan section)  Acute Rehab PT Goals Patient Stated Goal: Go home and feel better PT Goal  Formulation: With patient Time For Goal Achievement: 06/23/23 Potential to Achieve Goals: Good    Frequency Min 1X/week     Co-evaluation               AM-PAC PT "6 Clicks" Mobility  Outcome Measure Help needed turning from your back to your side while in a flat bed without using bedrails?: A Little Help needed moving from lying on your back to sitting on the side of a flat bed without using bedrails?: A Little Help needed moving to and from a bed to a chair (including a wheelchair)?: A Little Help needed standing up from a chair using your arms (e.g., wheelchair or bedside chair)?: A Little Help needed to walk in hospital room?: A Little Help needed climbing 3-5 steps with a railing? : A Lot 6 Click Score: 17    End of Session Equipment Utilized During Treatment: Gait belt Activity Tolerance: Patient tolerated treatment well Patient left: in chair;with call bell/phone within reach;with chair alarm set (with handoff to OT) Nurse Communication: Mobility status (Notified RN that pt with bowel movement during session) PT Visit Diagnosis: Unsteadiness on feet (R26.81);Muscle weakness (generalized) (M62.81);History of falling (Z91.81)    Time: 0981-1914 PT Time Calculation (min) (ACUTE ONLY): 18 min   Charges:   PT Evaluation $PT Eval Low Complexity: 1 Low   PT General Charges $$ ACUTE PT VISIT: 1 Visit  Lyman Speller., PT, DPT  Acute Rehabilitation Services  Office [phone]  06/09/2023, 1:29 PM

## 2023-06-09 NOTE — Evaluation (Signed)
Occupational Therapy Evaluation Patient Details Name: [name] [name] MRN: [id number] DOB: 1940-10-10 Today's Date: 06/09/2023   History of Present Illness Pt is an 82 y.o. female presenting with cough, dyspnea, wheezing, pleuritic pain and fever, admitted for multifocal pneumonia. PHMx:  AKI superimposed on stage III CKD, anxiety, depression, RA, bilateral carpal tunnel syndrome, congenital absence of right kidney, hyperlipidemia, elevated D-dimer, GERD, headache, heart murmur, HTN, MRSA bacteremia, left shoulder pain, PVD history of right external iliac stent, right lower lobe PNA, polymyalgia rheumatica, psoriasis, sepsis due to pneumonia, history of acute hypoxic respiratory failure, sleep apnea, tremor   Clinical Impression   This 82 yo female admitted with above presents to acute OT with PLOF of being able to dress and toilet herself but needed A to get in and out of her tub and needs intermittent more A due to when tremors are really bad--has aide and other friends to come in prn. She will continue to benefit from acute OT with follow up HHOT.       If plan is discharge home, recommend the following: A little help with walking and/or transfers;A little help with bathing/dressing/bathroom;Assistance with cooking/housework;Assistance with feeding;Help with stairs or ramp for entrance;Assist for transportation    Functional Status Assessment  Patient has had a recent decline in their functional status and demonstrates the ability to make significant improvements in function in a reasonable and predictable amount of time.  Equipment Recommendations  None recommended by OT       Precautions / Restrictions Precautions Precautions: Fall Precaution Comments: history of tremors Restrictions Weight Bearing Restrictions: No      Mobility Bed Mobility               General bed mobility comments: up standing with PT when I entered    Transfers Overall transfer level: Needs  assistance Equipment used: Rolling walker (2 wheels) Transfers: Sit to/from Stand, Bed to chair/wheelchair/BSC Sit to Stand: Min assist     Step pivot transfers: Min assist     General transfer comment: increased time to stedy in standing due to tremors.      Balance Overall balance assessment: Needs assistance Sitting-balance support: No upper extremity supported, Feet supported Sitting balance-Leahy Scale: Good     Standing balance support: Bilateral upper extremity supported, During functional activity Standing balance-Leahy Scale: Poor                             ADL either performed or assessed with clinical judgement   ADL Overall ADL's : Needs assistance/impaired Eating/Feeding: Sitting;Minimal assistance Eating/Feeding Details (indicate cue type and reason): due to tremors Grooming: Minimal assistance;Sitting Grooming Details (indicate cue type and reason): due to tremors Upper Body Bathing: Minimal assistance;Sitting Upper Body Bathing Details (indicate cue type and reason): due to tremors Lower Body Bathing: Minimal assistance Lower Body Bathing Details (indicate cue type and reason): due to tremors; sit<>stand min A as well Upper Body Dressing : Minimal assistance;Sitting Upper Body Dressing Details (indicate cue type and reason): due to tremors Lower Body Dressing: Minimal assistance;Sit to/from stand Lower Body Dressing Details (indicate cue type and reason): due to tremors Toilet Transfer: Minimal assistance;Ambulation Toilet Transfer Details (indicate cue type and reason): sink to recliner behind her Toileting- Clothing Manipulation and Hygiene: Minimal assistance;Sit to/from stand               Vision Patient Visual Report: No change from baseline  Pertinent Vitals/Pain Pain Assessment Pain Assessment: No/denies pain     Extremity/Trunk Assessment Upper Extremity Assessment Upper Extremity Assessment: Overall WFL for  tasks assessed;Right hand dominant (Bil tremors)        Communication Communication Communication: No apparent difficulties   Cognition Arousal: Alert Behavior During Therapy: WFL for tasks assessed/performed Overall Cognitive Status: Within Functional Limits for tasks assessed                                                  Home Living Family/patient expects to be discharged to:: Private residence Living Arrangements: Alone Available Help at Discharge: Personal care attendant;Neighbor;Available PRN/intermittently Type of Home: Mobile home Home Access: Ramped entrance     Home Layout: One level     Bathroom Shower/Tub: Chief Strategy Officer: Handicapped height Bathroom Accessibility: Yes   Home Equipment: Rollator (4 wheels);BSC/3in1;Shower seat;Wheelchair - manual (bath tub lift)   Additional Comments: has HH aide 3x/week 930am-6pm some days. States that she can call her "anytime day or night". Neighbors come by and check in daily.      Prior Functioning/Environment Prior Level of Function : Needs assist       Physical Assist : Mobility (physical);ADLs (physical) Mobility (physical): Transfers;Gait ADLs (physical): Bathing;Grooming Mobility Comments: rollator and RW interchangably ADLs Comments: does most of dressing independently, except for bra. States that aide helps her with "everything"        OT Problem List: Impaired balance (sitting and/or standing)      OT Treatment/Interventions: Self-care/ADL training;Balance training;Patient/family education    OT Goals(Current goals can be found in the care plan section) Acute Rehab OT Goals Patient Stated Goal: to go home and have HH therapies OT Goal Formulation: With patient Time For Goal Achievement: 06/23/23 Potential to Achieve Goals: Good  OT Frequency: Min 1X/week       AM-PAC OT "6 Clicks" Daily Activity     Outcome Measure Help from another person eating meals?: A  Little Help from another person taking care of personal grooming?: A Little Help from another person toileting, which includes using toliet, bedpan, or urinal?: A Little Help from another person bathing (including washing, rinsing, drying)?: A Little Help from another person to put on and taking off regular upper body clothing?: A Little Help from another person to put on and taking off regular lower body clothing?: A Little 6 Click Score: 18   End of Session Equipment Utilized During Treatment: Gait belt;Rolling walker (2 wheels)  Activity Tolerance: Patient tolerated treatment well Patient left: in chair;with call bell/phone within reach;with chair alarm set  OT Visit Diagnosis: Unsteadiness on feet (R26.81);Other abnormalities of gait and mobility (R26.89)                Time: 2440-1027 OT Time Calculation (min): 26 min Charges:  OT General Charges $OT Visit: 1 Visit OT Evaluation $OT Eval Moderate Complexity: 1 Mod OT Treatments $Self Care/Home Management : 8-22 mins  Lindon Romp OT Acute Rehabilitation Services Office [phone]    Evette Georges 06/09/2023, 11:31 AM

## 2023-06-09 NOTE — Progress Notes (Signed)
  Progress Note   Patient: EBRU TRUMBO DGU:[id number] DOB: 03-Aug-1940 DOA: 06/07/2023     2 DOS: the patient was seen and examined on 06/09/2023 at 10:45AM      Brief hospital course: 82 y.o. F with HTN, PVD s/p iliac stent, PMR, CKD IIIb solitary kidney baseline Cr 0.8-1.3, hx MRSA bacteremia, HLD, OSA not tolerant of CPAP, RA not on DMARD who presented with cough, dyspnea, wheezing, pleuritic pain and fever.  In the ER, CXR showed multifocal pneumonia and she was hypoxic, admitted for pneumonia.  COVID/RSV/Flu negative.     Assessment and Plan: * Multifocal pneumonia Now weaned off oxygen - Continue Rocephin and azithromycin, day 3 - Continue pulmonary toilet   Peripheral vascular disease (HCC) -Continue amlodipine, Plavix   Depression Grief - Continue BuSpar, Depakote  Tremor This does not appear like a intention tremor.  It appears at rest, suspect that this is anxiety related.  Anxiety - Continue Xanax, BuSpar, Depakote  Essential hypertension Blood pressure controlled - Continue amlodipine - Hold furosemide          Subjective: Patient is feeling gradually better, she still quite weak and has a tremor and is shaky, and appears unstable on not safe for discharge home today.  But she is overall improving.     Physical Exam: BP (!) 125/58 (BP Location: Right Arm)   Pulse 80   Temp 97.8 F (36.6 C) (Oral)   Resp 16   Ht 4' 11.5" (1.511 m)   Wt 60.3 kg   SpO2 97%   BMI 26.41 kg/m   Elderly adult female, sitting up in recliner, interactive and appropriate RRR, no murmurs, no peripheral edema Respiratory rate seems normal, lungs clear without rales or wheezes but overall very diminished Abdomen soft no tenderness palpation Attention normal, affect appropriate, judgment and insight appear normal   Data Reviewed: Blood cultures no growth Basic metabolic panel normal CBC shows mild thrombocytopenia, platelets 10.8, stable  Family Communication:  None present    Disposition: Status is: Inpatient Patient is an 82 year old woman who presented with multifocal pneumonia and hypoxia  She is improving but still quite debilitated, and unsafe to discharge home today.  I suspect she will be stronger and we will have resources in place to discharge home tomorrow        Author: Alberteen Sam, MD 06/09/2023 5:05 PM  For on call review [email or web address].

## 2023-06-10 DIAGNOSIS — F419 Anxiety disorder, unspecified: Secondary | ICD-10-CM | POA: Diagnosis not present

## 2023-06-10 DIAGNOSIS — J9601 Acute respiratory failure with hypoxia: Secondary | ICD-10-CM | POA: Diagnosis not present

## 2023-06-10 DIAGNOSIS — N1832 Chronic kidney disease, stage 3b: Secondary | ICD-10-CM | POA: Diagnosis not present

## 2023-06-10 DIAGNOSIS — J189 Pneumonia, unspecified organism: Secondary | ICD-10-CM | POA: Diagnosis not present

## 2023-06-10 NOTE — Progress Notes (Signed)
Refused resp treatment for patient as she was just medicated and I didn't want to decrease efficacy of buspar and xanax. Patient will let us know if she is awake and changes her mind or is having any SOB or respiratory symptoms.

## 2023-06-10 NOTE — Plan of Care (Signed)
Pt is A&O x 4. VSS, on room air. C/o headaches and prn tylenol given as ordered.  Bed alarm on. Call bell in reach. Will continue to monitor.   Problem: Education: Goal: Knowledge of General Education information will improve Description: Including pain rating scale, medication(s)/side effects and non-pharmacologic comfort measures Outcome: Progressing   Problem: Health Behavior/Discharge Planning: Goal: Ability to manage health-related needs will improve Outcome: Progressing   Problem: Clinical Measurements: Goal: Ability to maintain clinical measurements within normal limits will improve Outcome: Progressing Goal: Will remain free from infection Outcome: Progressing Goal: Diagnostic test results will improve Outcome: Progressing Goal: Respiratory complications will improve Outcome: Progressing Goal: Cardiovascular complication will be avoided Outcome: Progressing   Problem: Activity: Goal: Risk for activity intolerance will decrease Outcome: Progressing   Problem: Nutrition: Goal: Adequate nutrition will be maintained Outcome: Progressing   Problem: Coping: Goal: Level of anxiety will decrease Outcome: Progressing   Problem: Elimination: Goal: Will not experience complications related to bowel motility Outcome: Progressing Goal: Will not experience complications related to urinary retention Outcome: Progressing   Problem: Pain Management: Goal: General experience of comfort will improve Outcome: Progressing   Problem: Safety: Goal: Ability to remain free from injury will improve Outcome: Progressing   Problem: Skin Integrity: Goal: Risk for impaired skin integrity will decrease Outcome: Progressing   Problem: Education: Goal: Knowledge of disease or condition will improve Outcome: Progressing Goal: Knowledge of the prescribed therapeutic regimen will improve Outcome: Progressing Goal: Individualized Educational Video(s) Outcome: Progressing   Problem:  Activity: Goal: Ability to tolerate increased activity will improve Outcome: Progressing Goal: Will verbalize the importance of balancing activity with adequate rest periods Outcome: Progressing   Problem: Respiratory: Goal: Ability to maintain a clear airway will improve Outcome: Progressing Goal: Levels of oxygenation will improve Outcome: Progressing Goal: Ability to maintain adequate ventilation will improve Outcome: Progressing   Problem: Activity: Goal: Ability to tolerate increased activity will improve Outcome: Progressing   Problem: Clinical Measurements: Goal: Ability to maintain a body temperature in the normal range will improve Outcome: Progressing   Problem: Respiratory: Goal: Ability to maintain adequate ventilation will improve Outcome: Progressing Goal: Ability to maintain a clear airway will improve Outcome: Progressing

## 2023-06-10 NOTE — Progress Notes (Signed)
Patient used callbell to ask for help. She was shaking and disoriented. She was crying for her sister (deceased as of last week approx). Emesis x1 (approx 60cc white liquid, presumably vanilla Boost). Patient was given scheduled meds and PRN xanax. Stayed with her at bedside til she calmed down, drowsy and hoping to sleep now.

## 2023-06-10 NOTE — Progress Notes (Signed)
  Progress Note   Patient: [name] [name] HYQ:[id number] DOB: 1941-04-12 DOA: 06/07/2023     3 DOS: the patient was seen and examined on 06/10/2023 at 10:10AM      Brief hospital course: 82 y.o. F with HTN, PVD s/p iliac stent, PMR, CKD IIIb solitary kidney baseline Cr 0.8-1.3, hx MRSA bacteremia, HLD, OSA not tolerant of CPAP, RA not on DMARD who presented with cough, dyspnea, wheezing, pleuritic pain and fever.  In the ER, CXR showed multifocal pneumonia and she was hypoxic, admitted for pneumonia.  COVID/RSV/Flu negative.     Assessment and Plan: * Acute respiratory failure with hypoxia (HCC) Multifocal pneumonia Still vomiting with diarrhea, very weak and tired. - Continue Rocephin and azithromycin, day 4 - Continue pulmonary toilet    Peripheral vascular disease (HCC) Essential hypertension Blood pressure adequately controlled - Continue amlodipine, Plavix -Hold furosemide  Depression Anxiety Grief -Continue Xanax, BuSpar, Depakote           Subjective: Patient still vomiting yesterday, could not eat breakfast this morning.  Feels that she is too weak to go home, everything is "going right through her and she has diarrhea".  No fever, no more hypoxia.     Physical Exam: BP (!) 148/70   Pulse 86   Temp (!) 97.5 F (36.4 C) (Oral)   Resp 18   Ht 4' 11.5" (1.511 m)   Wt 60.3 kg   SpO2 95%   BMI 26.41 kg/m   Elderly adult female, sitting up in recliner, tired and with intermittent tremor when she gets emotional RRR, no murmurs, no peripheral edema Respiratory rate seems normal, lung sounds diminished, no obvious rales or wheezes appreciated Abdomen without grimace to palpation Attention normal, affect sad, judgment and insight appear normal, speech fluent, face symmetric, oriented to person, place, and time    Data Reviewed: No new labs  Family Communication: None present    Disposition: Status is: Inpatient Patient's oral intake is not yet  adequate given her age and severity of her pneumonia for discharge home, likely tomorrow        Author: Alberteen Sam, MD 06/10/2023 11:30 AM  For on call review [email or web address].

## 2023-06-10 NOTE — TOC Progression Note (Addendum)
Transition of Care [facility]) - Progression Note    Patient Details  Name: [name] [name] MRN: [id number] Date of Birth: September 30, 1940  Transition of Care [facility]) CM/SW Contact  Adrian Prows, RN Phone Number: 06/10/2023, 10:14 AM  Clinical Narrative:    PT recc received for HHPT/OT; spoke w/ pt in room; pt says she currently has services w/ [facility]; she would like to continue services w/ this agency; pt verified d/c address [phone] [place], [address]; LVM for Lysbeth Galas, agency rep; also LVM for Nils Pyle, weekend agency rep; agency contact info placed in follow up provider section of d/c instructions.       Expected Discharge Plan and Services                                               Social Determinants of Health (SDOH) Interventions SDOH Screenings   Food Insecurity: No Food Insecurity (06/07/2023)  Housing: Low Risk  (06/07/2023)  Transportation Needs: No Transportation Needs (06/07/2023)  Utilities: Not At Risk (06/07/2023)  Alcohol Screen: Low Risk  (10/13/2022)  Depression (PHQ2-9): High Risk (04/16/2023)  Financial Resource Strain: Low Risk  (10/13/2022)  Physical Activity: Insufficiently Active (10/13/2022)  Social Connections: Moderately Integrated (10/13/2022)  Stress: Stress Concern Present (10/13/2022)  Tobacco Use: Medium Risk (06/07/2023)    Readmission Risk Interventions    06/08/2023    1:37 PM 07/26/2022   12:20 PM 05/09/2022    3:18 PM  Readmission Risk Prevention Plan  Post Dischage Appt  Complete Complete  Medication Screening  Complete Complete  Transportation Screening Complete Complete   PCP or Specialist Appt within 5-7 Days Complete    Home Care Screening Complete    Medication Review (RN CM) Complete

## 2023-06-10 NOTE — Plan of Care (Signed)

## 2023-06-10 NOTE — Progress Notes (Signed)
Physical Therapy Treatment Patient Details Name: [name] [name] MRN: [id number] DOB: 1940/11/14 Today's Date: 06/10/2023   History of Present Illness Pt is an 82 y.o. female presenting with cough, dyspnea, wheezing, pleuritic pain and fever, admitted for multifocal pneumonia. PHMx:  AKI superimposed on stage III CKD, anxiety, depression, RA, bilateral carpal tunnel syndrome, congenital absence of right kidney, hyperlipidemia, elevated D-dimer, GERD, headache, heart murmur, HTN, MRSA bacteremia, left shoulder pain, PVD history of right external iliac stent, right lower lobe PNA, polymyalgia rheumatica, psoriasis, sepsis due to pneumonia, history of acute hypoxic respiratory failure, sleep apnea, tremor    PT Comments  Pt is progressing toward acute PT goals this session with progression of ambulation distance. Pt ambulated ~19ft with CGA with x1 episode of unsteadiness due to tremor onset requiring seated rest break. Pt reporting to therapist that she will now be set up with increase HH aide assistance up to 7days/week from 10am-5pm for up to 3 months if needed. Pt will benefit from continued skilled PT to increase their independence and maximize safety with mobility.      If plan is discharge home, recommend the following: A little help with walking and/or transfers;A little help with bathing/dressing/bathroom;Help with stairs or ramp for entrance;Assistance with cooking/housework;Assist for transportation   Can travel by private vehicle        Equipment Recommendations  None recommended by PT    Recommendations for Other Services       Precautions / Restrictions Precautions Precautions: Fall Precaution Comments: history of tremors Restrictions Weight Bearing Restrictions: No     Mobility  Bed Mobility Overal bed mobility: Modified Independent             General bed mobility comments: increased time, HOB slightly elevated.    Transfers Overall transfer level: Needs  assistance Equipment used: Rolling walker (2 wheels) Transfers: Sit to/from Stand Sit to Stand: Contact guard assist           General transfer comment: STS x2; cues for hand placement. Increased time for steadying in standing.    Ambulation/Gait Ambulation/Gait assistance: Contact guard assist Gait Distance (Feet): 80 Feet Assistive device: Rolling walker (2 wheels) Gait Pattern/deviations: Step-through pattern, Decreased stride length, Narrow base of support Gait velocity: decr     General Gait Details: x1 episodes of tremoring onset requiring seated rest break due to unsteadiness (chair follow provided during ambulation). X1 standing rest break against wall due to low back/ L LE issues. Reports that at times after walking she gets numbness in L LE.   Stairs             Wheelchair Mobility     Tilt Bed    Modified Rankin (Stroke Patients Only)       Balance Overall balance assessment: Needs assistance Sitting-balance support: Feet supported, No upper extremity supported Sitting balance-Leahy Scale: Good     Standing balance support: Bilateral upper extremity supported, During functional activity Standing balance-Leahy Scale: Fair                              Cognition Arousal: Alert Behavior During Therapy: WFL for tasks assessed/performed Overall Cognitive Status: Within Functional Limits for tasks assessed                                          Exercises  General Comments        Pertinent Vitals/Pain Pain Assessment Pain Assessment: No/denies pain    Home Living                          Prior Function            PT Goals (current goals can now be found in the care plan section) Acute Rehab PT Goals Patient Stated Goal: Go home and feel better PT Goal Formulation: With patient Time For Goal Achievement: 06/23/23 Potential to Achieve Goals: Good Progress towards PT goals: Progressing  toward goals    Frequency    Min 1X/week      PT Plan      Co-evaluation              AM-PAC PT "6 Clicks" Mobility   Outcome Measure  Help needed turning from your back to your side while in a flat bed without using bedrails?: None Help needed moving from lying on your back to sitting on the side of a flat bed without using bedrails?: A Little Help needed moving to and from a bed to a chair (including a wheelchair)?: A Little Help needed standing up from a chair using your arms (e.g., wheelchair or bedside chair)?: A Little Help needed to walk in hospital room?: A Little Help needed climbing 3-5 steps with a railing? : A Lot 6 Click Score: 18    End of Session Equipment Utilized During Treatment: Gait belt Activity Tolerance: Patient tolerated treatment well Patient left: in chair;with call bell/phone within reach;with chair alarm set Nurse Communication: Mobility status PT Visit Diagnosis: Unsteadiness on feet (R26.81);Muscle weakness (generalized) (M62.81);History of falling (Z91.81)     Time: 6578-4696 PT Time Calculation (min) (ACUTE ONLY): 24 min  Charges:    $Therapeutic Activity: 23-37 mins PT General Charges $$ ACUTE PT VISIT: 1 Visit                     Lyman Speller PT, DPT  Acute Rehabilitation Services  Office [phone]  06/10/2023, 1:48 PM

## 2023-06-11 ENCOUNTER — Other Ambulatory Visit (HOSPITAL_COMMUNITY): Payer: Self-pay

## 2023-06-11 DIAGNOSIS — J9601 Acute respiratory failure with hypoxia: Secondary | ICD-10-CM | POA: Diagnosis not present

## 2023-06-11 MED ORDER — CEFDINIR 300 MG PO CAPS
300.0000 mg | ORAL_CAPSULE | Freq: Two times a day (BID) | ORAL | 0 refills | Status: DC
  Filled 2023-06-11: qty 4, 2d supply, fill #0

## 2023-06-11 MED ORDER — CEFDINIR 300 MG PO CAPS: 300.0000 mg | ORAL_CAPSULE | Freq: Two times a day (BID) | ORAL | 0 refills | Status: DC

## 2023-06-11 MED ORDER — ALPRAZOLAM 0.25 MG PO TABS: 0.2500 mg | ORAL_TABLET | Freq: Every day | ORAL | 0 refills | Status: DC | PRN

## 2023-06-11 MED ORDER — ALPRAZOLAM 0.25 MG PO TABS
0.2500 mg | ORAL_TABLET | Freq: Every day | ORAL | 0 refills | Status: DC | PRN
  Filled 2023-06-11: qty 5, 5d supply, fill #0

## 2023-06-11 NOTE — Care Management Important Message (Signed)
Important Message  Patient Details IM Letter given. Name: [name] [name] MRN: [id number] Date of Birth: 1940-10-16   Important Message Given:  Yes - Medicare IM     Caren Macadam 06/11/2023, 10:13 AM

## 2023-06-11 NOTE — Discharge Summary (Signed)
Physician Discharge Summary   Patient: [name] [name] MRN: [id number] DOB: Mar 21, 1941  Admit date:     06/07/2023  Discharge date: 06/11/23  Discharge Physician: Alberteen Sam   PCP: Glendale Chard, DO     Recommendations at discharge:  Follow up with PCP Dr. Criselda Peaches in 1 week for pneumonia Follow up with [facility] for complicated grief     Discharge Diagnoses: Principal Problem:   Acute respiratory failure with hypoxia (HCC) Active Problems:   Multifocal pneumonia   Essential hypertension   Anxiety   GERD (gastroesophageal reflux disease)   Depression   Glaucoma   OSA (obstructive sleep apnea)   Peripheral vascular disease (HCC)   Epistaxis   CKD stage 3b, GFR 30-44 ml/min (HCC)   Dyslipidemia   Thrombocytopenia [facility])      Hospital Course: 82 y.o. F with HTN, PVD s/p iliac stent, PMR, CKD IIIb solitary kidney baseline Cr 0.8-1.3, hx MRSA bacteremia, HLD, OSA not tolerant of CPAP, RA not on DMARD who presented with cough, dyspnea, wheezing, pleuritic pain and fever.  In the ER, CXR showed multifocal pneumonia and she was hypoxic, admitted for pneumonia.  COVID/RSV/Flu negative.       * Acute respiratory failure with hypoxia (HCC) Multifocal pneumonia Patient admitted with multifocal pneumonia, hypoxia, PSI 93, risk class IV, started on antibiotics, pulmonary toilet.  She improved, was weaned off of oxygen.  Patient now mentating at baseline, taking orals.  Temp < 100 F, heart rate < 100bpm, RR < 24, SpO2 at baseline.   Stable for discharge to complete 2 more days cefdinir.   Complicated grief Recently lost her daughter to what sounds like cirrhosis.  She noted panic attacks in the hospital, and a tremor, which she described as a benign familial tremor, but clearly was prompted by discussion of her grief and resolved with distraction.    She has grief counseling pending with [facility] she claimed.  On Buspar, Depakote  Thrombocytopenia (HCC) Mild,  resolved, no bleeding.  Dyslipidemia Intolerant of statins  CKD stage 3b, GFR 30-44 ml/min (HCC) Cr stable  Epistaxis Brief, self limited  Peripheral vascular disease (HCC) Essential hypertension Statin intolerant. On Plavix, amlodipine, furosemide  OSA (obstructive sleep apnea) Intolerant of CPAP             The [facility] Controlled Substances Registry was reviewed for this patient prior to discharge.  Consultants: None Procedures performed: None  Disposition: Home health Diet recommendation:  Discharge Diet Orders (From admission, onward)     Start     Ordered   06/11/23 0000  Diet - low sodium heart healthy        06/11/23 1111             DISCHARGE MEDICATION: Allergies as of 06/11/2023       Reactions   Statins    Statins Swelling, Rash, Other (See Comments)   Swelling involving tongue; also causes muscle pain        Medication List     TAKE these medications    acetaminophen 500 MG tablet Commonly known as: TYLENOL Take 500 mg by mouth every 8 (eight) hours as needed for moderate pain.   albuterol 108 (90 Base) MCG/ACT inhaler Commonly known as: VENTOLIN HFA Inhale 1 puff into the lungs daily as needed for shortness of breath.   ALPRAZolam 0.25 MG tablet Commonly known as: XANAX Take 1 tablet (0.25 mg total) by mouth daily as needed for anxiety.   amLODipine 2.5 MG tablet  Commonly known as: NORVASC Take 1 tablet (2.5 mg total) by mouth daily.   busPIRone 10 MG tablet Commonly known as: BUSPAR TAKE TWO TABLETS BY MOUTH THREE TIMES A DAY What changed: See the new instructions.   cefdinir 300 MG capsule Commonly known as: OMNICEF Take 1 capsule (300 mg total) by mouth 2 (two) times daily.   clopidogrel 75 MG tablet Commonly known as: PLAVIX Take 1 tablet (75 mg total) by mouth daily.   divalproex 125 MG DR tablet Commonly known as: DEPAKOTE Take 2 tablets (250 mg total) by mouth 2 (two) times daily.   furosemide 20  MG tablet Commonly known as: LASIX Take 20 mg by mouth every other day.   gabapentin 100 MG capsule Commonly known as: NEURONTIN TAKE 1 CAPSULE BY MOUTH 3 TIMES A DAY   Nasal Moist Gel Apply after using nasal saline spray   nystatin powder Commonly known as: MYCOSTATIN/NYSTOP Apply 1 Application topically 3 (three) times daily. Under the breast   nystatin-triamcinolone ointment Commonly known as: MYCOLOG Apply 1 Application topically 2 (two) times daily. Under the breasts   pantoprazole 40 MG tablet Commonly known as: PROTONIX Take 1 tablet (40 mg total) by mouth 2 (two) times daily.   sodium chloride 0.65 % Soln nasal spray Commonly known as: OCEAN Use twice daily to keep nose moist.   sucralfate 1 g tablet Commonly known as: CARAFATE Take 1 tablet (1 g total) by mouth 4 (four) times daily -  with meals and at bedtime. Crush into powder, add water, and drink when taking   traMADol 50 MG tablet Commonly known as: ULTRAM Take 1 tablet (50 mg total) by mouth 3 (three) times daily as needed. Needs appt before next refill. What changed:  reasons to take this additional instructions        Follow-up Information     [facility] Follow up.   Specialty: [facility] Contact information: [address] [place] [address] [phone]         Glendale Chard, DO. Schedule an appointment as soon as possible for a visit in 1 week(s).   Specialty: Family Medicine Contact information: [address] [place] [address] [phone]                 Discharge Instructions     Diet - low sodium heart healthy   Complete by: As directed    Discharge instructions   Complete by: As directed    **IMPORTANT DISCHARGE INSTRUCTIONS**   From Dr. Maryfrances Bunnell: You were admitted with pneumonia.  You were treated with antibiotics.  You should continue 2 more days of antibiotics, Cefdinir, starting tomorrow morning Take  cefdinir 300 mg twice daily for 2 more days  Use the incentive spirometer  Go see your primary doctor in 1 week  If you have a panic attack, take the Xanax 0.25 mg  This is habit forming, so do not take daily Do not take at the same time as Tramadol because they are both sedating   Resume all your other home medicines without change   Increase activity slowly   Complete by: As directed        Discharge Exam: Filed Weights   06/07/23 0925  Weight: 60.3 kg    General: Pt is alert, awake, not in acute distress, appeared anxious Cardiovascular: RRR, nl S1-S2, no murmurs appreciated.   No LE edema.   Respiratory: Normal respiratory rate and rhythm.  CTAB without rales or wheezes. Abdominal: Abdomen soft and non-tender.  No distension or HSM.   Neuro/Psych: Strength symmetric in upper and lower extremities.  Judgment and insight appear at baseline.   Condition at discharge: fair  The results of significant diagnostics from this hospitalization (including imaging, microbiology, ancillary and laboratory) are listed below for reference.   Imaging Studies: CT CHEST ABDOMEN PELVIS W CONTRAST  Result Date: 06/07/2023 CLINICAL DATA:  Cough.  Weakness. EXAM: CT CHEST, ABDOMEN, AND PELVIS WITH CONTRAST TECHNIQUE: Multidetector CT imaging of the chest, abdomen and pelvis was performed following the standard protocol during bolus administration of intravenous contrast. RADIATION DOSE REDUCTION: This exam was performed according to the departmental dose-optimization program which includes automated exposure control, adjustment of the mA and/or kV according to patient size and/or use of iterative reconstruction technique. CONTRAST:  75mL OMNIPAQUE IOHEXOL 300 MG/ML  SOLN COMPARISON:  07/22/2022.  Current chest radiograph. FINDINGS: CT CHEST FINDINGS Cardiovascular: Heart normal in size and configuration. Left and right coronary artery calcifications. No pericardial effusion. Great vessels are  normal in caliber. Aortic atherosclerosis without aneurysm or dissection. Atherosclerosis at the origin of the arch branch vessels. Proximally 50% narrowing at the origin of the left subclavian artery. These findings are stable. Mediastinum/Nodes: 2.1 cm heterogeneous left thyroid nodule. This underwent fine-needle aspirate on 05/25/2023. No neck base, mediastinal or hilar masses. No enlarged lymph nodes. Trachea and esophagus are unremarkable. Lungs/Pleura: Multiple focal areas of lung consolidation. These are evident in the medial base of the right lower lobe, anteromedial aspect of the left upper lobe and base of the left lower lobe. Left upper lobe nodule centered on image 71, series 6, 9 mm. There are additional areas of linear type opacity most prominently at the bases consistent with atelectasis. No pleural effusion or pneumothorax. Musculoskeletal: No fracture or acute finding. No bone lesion. No chest wall mass. CT ABDOMEN PELVIS FINDINGS Hepatobiliary: No focal liver abnormality is seen. No gallstones, gallbladder wall thickening, or biliary dilatation. Pancreas: Unremarkable. No pancreatic ductal dilatation or surrounding inflammatory changes. Spleen: Borderline to mildly enlarged, 13 cm from superior to inferior, unchanged. No splenic mass. Adrenals/Urinary Tract: Normal adrenal glands. Severe atrophy/scarring of the left kidney. Right kidney normal in size. No renal masses. No stones. No hydronephrosis. Normal ureters. Normal bladder. Stomach/Bowel: Normal stomach. Small bowel and colon are normal in caliber. No wall thickening. No inflammation. Vascular/Lymphatic: Aortic atherosclerosis. No enlarged abdominal or pelvic lymph nodes. Reproductive: Status post hysterectomy. No adnexal masses. Other: No abdominal wall hernia or abnormality. No abdominopelvic ascites. Musculoskeletal: No fracture or acute finding. No aggressive bone lesion. IMPRESSION: 1. Bilateral focal areas of lung consolidation  consistent with multifocal pneumonia. There is also a 9 mm nodule in the left upper lobe. This may also be infectious/inflammatory in etiology, however, follow-up is recommended with repeat chest CT in 3 months. This recommendation follows the consensus statement: Guidelines for Management of Incidental Pulmonary Nodules Detected on CT Images: From the Fleischner Society 2017; Radiology 2017; 284:228-243. 2. No other evidence of an acute abnormality within the chest, abdomen or pelvis. 3. Aortic atherosclerosis. Electronically Signed   By: Amie Portland M.D.   On: 06/07/2023 12:35   DG Chest Port 1 View  Result Date: 06/07/2023 CLINICAL DATA:  Cough and progressive weakness EXAM: PORTABLE CHEST 1 VIEW COMPARISON:  04/01/2023 FINDINGS: Low volume chest with indistinct density at the bases. No edema, effusion, air bronchogram, or pneumothorax. Normal heart size and mediastinal contours. Artifact from EKG leads. IMPRESSION:  Low volume chest with atelectasis at the bases. Electronically Signed   By: Tiburcio Pea M.D.   On: 06/07/2023 10:00   [place] FNA BX THYROID 1ST LESION AFIRMA  Result Date: 05/25/2023 INDICATION: Indeterminate thyroid nodule EXAM: ULTRASOUND GUIDED FINE NEEDLE ASPIRATION OF INDETERMINATE THYROID NODULE COMPARISON:  02/12/2023 MEDICATIONS: None COMPLICATIONS: None immediate. TECHNIQUE: Informed written consent was obtained from the patient after a discussion of the risks, benefits and alternatives to treatment. Questions regarding the procedure were encouraged and answered. A timeout was performed prior to the initiation of the procedure. Pre-procedural ultrasound scanning demonstrated unchanged size and appearance of the indeterminate nodule within the left mid thyroid The procedure was planned. The neck was prepped in the usual sterile fashion, and a sterile drape was applied covering the operative field. A timeout was performed prior to the initiation of the procedure. Local anesthesia  was provided with 1% lidocaine. Under direct ultrasound guidance, 5 FNA biopsies were performed of the thyroid nodule with a 25 gauge needle. Multiple ultrasound images were saved for procedural documentation purposes. The samples were prepared and submitted to pathology. Limited post procedural scanning was negative for hematoma or additional complication. Dressings were placed. The patient tolerated the above procedures procedure well without immediate postprocedural complication. FINDINGS: Nodule reference number based on prior diagnostic ultrasound: 1 Maximum size: 2.8 cm Location: Left; Mid ACR TI-RADS risk category: TR4 (4-6 points) Reason for biopsy: meets ACR TI-RADS criteria Ultrasound imaging confirms appropriate placement of the needles within the thyroid nodule. IMPRESSION: Technically successful ultrasound guided fine needle aspiration of left mid solid thyroid nodule (labeled 1, 20 cm, TI-RADS category 4). Marliss Coots, MD [facility] Radiology Electronically Signed   By: Marliss Coots M.D.   On: 05/25/2023 16:31    Microbiology: Results for orders placed or performed during the hospital encounter of 06/07/23  Blood Culture (routine x 2)     Status: None (Preliminary result)   Collection Time: 06/07/23  9:58 AM   Specimen: Right Antecubital; Blood  Result Value Ref Range Status   Specimen Description   Final    RIGHT ANTECUBITAL BOTTLES DRAWN AEROBIC AND ANAEROBIC Performed at [facility], [address], [place], [place] [address]    Special Requests   Final    Blood Culture adequate volume Performed at [facility], [address] [place], [place] [address]    Culture   Final    NO GROWTH 4 DAYS Performed at [facility], [address], [place], [place] [address]    Report Status PENDING  Incomplete  Resp panel by RT-PCR (RSV, Flu A&B, Covid) Anterior Nasal Swab     Status: None    Collection Time: 06/07/23 10:08 AM   Specimen: Anterior Nasal Swab  Result Value Ref Range Status   SARS Coronavirus 2 by RT PCR NEGATIVE NEGATIVE Final    Comment: (NOTE) SARS-CoV-2 target nucleic acids are NOT DETECTED.  The SARS-CoV-2 RNA is generally detectable in upper respiratory specimens during the acute phase of infection. The lowest concentration of SARS-CoV-2 viral copies this assay can detect is 138 copies/mL. A negative result does not preclude SARS-Cov-2 infection and should not be used as the sole basis for treatment or other patient management decisions. A negative result may occur with  improper specimen collection/handling, submission of specimen other than nasopharyngeal swab, presence of viral mutation(s) within the areas targeted by this assay, and inadequate number of viral copies(<138 copies/mL). A negative  result must be combined with clinical observations, patient history, and epidemiological information. The expected result is Negative.  Fact Sheet for Patients:  BloggerCourse.com  Fact Sheet for Healthcare Providers:  SeriousBroker.it  This test is no t yet approved or cleared by the [place] FDA and  has been authorized for detection and/or diagnosis of SARS-CoV-2 by FDA under an Emergency Use Authorization (EUA). This EUA will remain  in effect (meaning this test can be used) for the duration of the COVID-19 declaration under Section 564(b)(1) of the Act, 21 U.S.C.section 360bbb-3(b)(1), unless the authorization is terminated  or revoked sooner.       Influenza A by PCR NEGATIVE NEGATIVE Final   Influenza B by PCR NEGATIVE NEGATIVE Final    Comment: (NOTE) The Xpert Xpress SARS-CoV-2/FLU/RSV plus assay is intended as an aid in the diagnosis of influenza from Nasopharyngeal swab specimens and should not be used as a sole basis for treatment. Nasal washings and aspirates are unacceptable for  Xpert Xpress SARS-CoV-2/FLU/RSV testing.  Fact Sheet for Patients: BloggerCourse.com  Fact Sheet for Healthcare Providers: SeriousBroker.it  This test is not yet approved or cleared by the [place] FDA and has been authorized for detection and/or diagnosis of SARS-CoV-2 by FDA under an Emergency Use Authorization (EUA). This EUA will remain in effect (meaning this test can be used) for the duration of the COVID-19 declaration under Section 564(b)(1) of the Act, 21 U.S.C. section 360bbb-3(b)(1), unless the authorization is terminated or revoked.     Resp Syncytial Virus by PCR NEGATIVE NEGATIVE Final    Comment: (NOTE) Fact Sheet for Patients: BloggerCourse.com  Fact Sheet for Healthcare Providers: SeriousBroker.it  This test is not yet approved or cleared by the [place] FDA and has been authorized for detection and/or diagnosis of SARS-CoV-2 by FDA under an Emergency Use Authorization (EUA). This EUA will remain in effect (meaning this test can be used) for the duration of the COVID-19 declaration under Section 564(b)(1) of the Act, 21 U.S.C. section 360bbb-3(b)(1), unless the authorization is terminated or revoked.  Performed at [facility], [address] [place], [place] [address]   Blood Culture (routine x 2)     Status: None (Preliminary result)   Collection Time: 06/07/23 10:35 AM   Specimen: Left Antecubital; Blood  Result Value Ref Range Status   Specimen Description   Final    LEFT ANTECUBITAL BOTTLES DRAWN AEROBIC AND ANAEROBIC Performed at [facility], [address], [place], [place] [address]    Special Requests   Final    Blood Culture adequate volume Performed at [facility], [address], [place], [place] [address]    Culture   Final    NO GROWTH 4 DAYS Performed at [facility], [address] [place], [place] [address]    Report Status PENDING  Incomplete  MRSA Next Gen by PCR, Nasal     Status: None   Collection Time: 06/08/23 12:00 PM  Result Value Ref Range Status   MRSA by PCR Next Gen NOT DETECTED NOT DETECTED Final    Comment: (NOTE) The GeneXpert MRSA Assay (FDA approved for NASAL specimens only), is one component of a comprehensive MRSA colonization surveillance program. It is not intended to diagnose MRSA infection nor to guide or monitor treatment for MRSA infections. Test performance is not FDA approved in patients less than 36 years old. Performed at [facility], [address], [place], [place] [address]  Labs: CBC: Recent Labs  Lab 06/07/23 1035 06/08/23 0544 06/09/23 0554  WBC 15.3* 9.5 6.6  NEUTROABS 12.7*  --   --   HGB 12.4 10.6* 10.8*  HCT 37.1 32.1* 33.1*  MCV 105.4* 106.6* 108.5*  PLT 116* 106* 112*   Basic Metabolic Panel: Recent Labs  Lab 06/07/23 1035 06/08/23 0544 06/09/23 0554  NA 137 137 141  K 3.6 4.1 3.7  CL 101 105 106  CO2 24 24 26   GLUCOSE 97 115* 84  BUN 20 16 21   CREATININE 1.31* 0.82 1.09*  CALCIUM 9.0 8.4* 8.7*  MG 1.8  --   --   PHOS 3.0  --   --    Liver Function Tests: Recent Labs  Lab 06/07/23 1035 06/08/23 0544  AST 17 12*  ALT 9 8  ALKPHOS 55 48  BILITOT 0.9 0.6  PROT 7.3 5.9*  ALBUMIN 3.7 2.9*   CBG: No results for input(s): "GLUCAP" in the last 168 hours.  Discharge time spent: approximately 35 minutes spent on discharge counseling, evaluation of patient on day of discharge, and coordination of discharge planning with nursing, social work, pharmacy and case management  Signed: Alberteen Sam, MD [facility] 06/11/2023

## 2023-06-11 NOTE — Plan of Care (Signed)

## 2023-06-12 ENCOUNTER — Telehealth: Payer: Self-pay

## 2023-06-12 LAB — CULTURE, BLOOD (ROUTINE X 2)
Culture: NO GROWTH
Culture: NO GROWTH
Special Requests: ADEQUATE
Special Requests: ADEQUATE

## 2023-06-12 NOTE — Transitions of Care (Post Inpatient/ED Visit) (Unsigned)
   06/12/2023  Name: [name] [name] MRN: [id number] DOB: [birth year]  Today's TOC FU Call Status: Today's TOC FU Call Status:: Unsuccessful Call (1st Attempt) Unsuccessful Call (1st Attempt) Date: 06/12/23  Attempted to reach the patient regarding the most recent Inpatient/ED visit.  Follow Up Plan: Additional outreach attempts will be made to reach the patient to complete the Transitions of Care (Post Inpatient/ED visit) call.   Signature Karena Addison, LPN [facility] Nurse Health Advisor Direct Dial [phone]

## 2023-06-13 NOTE — Transitions of Care (Post Inpatient/ED Visit) (Signed)
06/13/2023  Name: [name] [name] MRN: [id number] DOB: Dec 19, 1940  Today's TOC FU Call Status: Today's TOC FU Call Status:: Successful TOC FU Call Completed Unsuccessful Call (1st Attempt) Date: 06/12/23 [facility] FU Call Complete Date: 06/13/23 Patient's Name and Date of Birth confirmed.  Transition Care Management Follow-up Telephone Call Date of Discharge: 06/11/23 Discharge Facility: [facility]) Type of Discharge: Inpatient Admission Primary Inpatient Discharge Diagnosis:: pneumonia How have you been since you were released from the hospital?: Better Any questions or concerns?: No  Items Reviewed: Did you receive and understand the discharge instructions provided?: Yes Medications obtained,verified, and reconciled?: Yes (Medications Reviewed) Any new allergies since your discharge?: No Dietary orders reviewed?: Yes Do you have support at home?: Yes Name of Support/Comfort Primary Source: caregiver  Medications Reviewed Today: Medications Reviewed Today     Reviewed by Karena Addison, LPN (Licensed Practical Nurse) on 06/13/23 at 1555  Med List Status: <None>   Medication Order Taking? Sig Documenting Provider Last Dose Status Informant  acetaminophen (TYLENOL) 500 MG tablet [id number] No Take 500 mg by mouth every 8 (eight) hours as needed for moderate pain. [provider] Past Month Active Self, Pharmacy Records  albuterol (VENTOLIN HFA) 108 (90 Base) MCG/ACT inhaler [id number] No Inhale 1 puff into the lungs daily as needed for shortness of breath. [provider] 06/07/2023 Active Self, Pharmacy Records  ALPRAZolam Prudy Feeler) 0.25 MG tablet [id number]  Take 1 tablet (0.25 mg total) by mouth daily as needed for anxiety. Alberteen Sam, MD  Active   amLODipine (NORVASC) 2.5 MG tablet [id number] No Take 1 tablet (2.5 mg total) by mouth daily. Glendale Chard, DO 06/06/2023 Active Self, Pharmacy Records  busPIRone (BUSPAR) 10 MG tablet [id number] No TAKE TWO  TABLETS BY MOUTH THREE TIMES A DAY  Patient taking differently: Take 10 mg by mouth 3 (three) times daily.   Glendale Chard, DO 06/06/2023 Active Self, Pharmacy Records  cefdinir (OMNICEF) 300 MG capsule [id number]  Take 1 capsule (300 mg total) by mouth 2 (two) times daily. Alberteen Sam, MD  Active   clopidogrel (PLAVIX) 75 MG tablet [id number] No Take 1 tablet (75 mg total) by mouth daily. Glendale Chard, DO 06/06/2023 Active Self, Pharmacy Records  divalproex (DEPAKOTE) 125 MG DR tablet [id number] No Take 2 tablets (250 mg total) by mouth 2 (two) times daily. Valetta Close, MD 06/06/2023 Active Self, Pharmacy Records  furosemide (LASIX) 20 MG tablet [id number] No Take 20 mg by mouth every other day. [provider] 06/07/2023 Active Self, Pharmacy Records  gabapentin (NEURONTIN) 100 MG capsule [id number] No TAKE 1 CAPSULE BY MOUTH 3 TIMES A DAY Glendale Chard, DO 06/06/2023 Active Self, Pharmacy Records  nystatin (MYCOSTATIN/NYSTOP) powder [id number] No Apply 1 Application topically 3 (three) times daily. Under the breast Sharion Dove, Dimitry, MD 06/06/2023 Active Self, Pharmacy Records  nystatin-triamcinolone ointment [facility]) [id number] No Apply 1 Application topically 2 (two) times daily. Under the breasts Sharion Dove, Dimitry, MD 06/06/2023 Active Self, Pharmacy Records  pantoprazole (PROTONIX) 40 MG tablet [id number] No Take 1 tablet (40 mg total) by mouth 2 (two) times daily. Glendale Chard, DO 06/06/2023 Active Self, Pharmacy Records  Propyl Glycol-Hydroxyethylcell (NASAL MOIST) GEL [id number] No Apply after using nasal saline spray Valetta Close, MD 06/07/2023 Active Self, Pharmacy Records  sodium chloride (OCEAN) 0.65 % SOLN nasal spray [id number] No Use twice daily to keep nose moist. Valetta Close, MD unknown Active Self, Pharmacy Records  sucralfate (CARAFATE) 1 g tablet [id number] No Take 1 tablet (  1 g total) by mouth 4 (four) times daily -  with meals and at bedtime. Crush  into powder, add water, and drink when taking Bess Kinds, MD 06/06/2023 Active Self, Pharmacy Records  traMADol (ULTRAM) 50 MG tablet [id number]  Take 1 tablet (50 mg total) by mouth 3 (three) times daily as needed. Needs appt before next refill. Celine Mans, MD  Active             Home Care and Equipment/Supplies: Were Home Health Services Ordered?: NA Any new equipment or medical supplies ordered?: NA  Functional Questionnaire: Do you need assistance with bathing/showering or dressing?: No Do you need assistance with meal preparation?: No Do you need assistance with eating?: No Do you have difficulty maintaining continence: No Do you need assistance with getting out of bed/getting out of a chair/moving?: No Do you have difficulty managing or taking your medications?: No  Follow up appointments reviewed: PCP Follow-up appointment confirmed?: Yes Date of PCP follow-up appointment?: 06/15/23 Follow-up Provider: [facility] Follow-up appointment confirmed?: NA Do you need transportation to your follow-up appointment?: No Do you understand care options if your condition(s) worsen?: Yes-patient verbalized understanding    SIGNATURE Karena Addison, LPN [facility] Nurse Health Advisor Direct Dial [phone]

## 2023-06-15 ENCOUNTER — Encounter: Payer: Self-pay | Admitting: Family Medicine

## 2023-06-15 ENCOUNTER — Ambulatory Visit (INDEPENDENT_AMBULATORY_CARE_PROVIDER_SITE_OTHER): Payer: HMO | Admitting: Family Medicine

## 2023-06-15 VITALS — BP 135/68 | HR 90 | Ht 59.0 in | Wt 154.2 lb

## 2023-06-15 DIAGNOSIS — F332 Major depressive disorder, recurrent severe without psychotic features: Secondary | ICD-10-CM

## 2023-06-15 DIAGNOSIS — F419 Anxiety disorder, unspecified: Secondary | ICD-10-CM

## 2023-06-15 DIAGNOSIS — Z23 Encounter for immunization: Secondary | ICD-10-CM | POA: Diagnosis not present

## 2023-06-15 DIAGNOSIS — K219 Gastro-esophageal reflux disease without esophagitis: Secondary | ICD-10-CM | POA: Diagnosis not present

## 2023-06-15 DIAGNOSIS — F32A Depression, unspecified: Secondary | ICD-10-CM

## 2023-06-15 DIAGNOSIS — J189 Pneumonia, unspecified organism: Secondary | ICD-10-CM

## 2023-06-15 MED ORDER — DIVALPROEX SODIUM 125 MG PO DR TAB
250.0000 mg | DELAYED_RELEASE_TABLET | Freq: Two times a day (BID) | ORAL | 2 refills | Status: DC
Start: 2023-06-15 — End: 2024-01-14

## 2023-06-15 MED ORDER — CITALOPRAM HYDROBROMIDE 20 MG PO TABS: 20.0000 mg | ORAL_TABLET | Freq: Every day | ORAL | 3 refills | Status: DC

## 2023-06-15 MED ORDER — PANTOPRAZOLE SODIUM 40 MG PO TBEC
40.0000 mg | DELAYED_RELEASE_TABLET | Freq: Two times a day (BID) | ORAL | 1 refills | Status: DC
Start: 2023-06-15 — End: 2024-01-14

## 2023-06-15 NOTE — Assessment & Plan Note (Addendum)
Patient appears well at this time with normal oxygen saturation and lung exam. Advised continued use of imodium PRN for diarrhea that started while hospitalized and reviewed return precautions if this does not resolve within a week or so.

## 2023-06-15 NOTE — Patient Instructions (Addendum)
Thank you for coming in today! Here is a summary of what we discussed:  For your mood, please start taking Celexa once daily. It usually takes several weeks for this to have an effect. Please also reach out to the Hospice folks about getting started with grief counseling.  If you ever have thoughts of harming yourself or that you would be better off not around, please seek help at the [facility] at  [address] in [place], [place]. You can also call: [phone] or [phone]   You should return to our clinic on Feb 13th at 8:50AM for a follow up appointment.  If you haven't already, sign up for My Chart to have easy access to your labs results, and communication with your primary care physician.  I recommend that you always bring your medications to each appointment as this makes it easy to ensure you are on the correct medications and helps [place] not miss refills when you need them.  Please call the clinic at [phone] if your symptoms worsen or you have any concerns.  Best, Dr Dolan Amen

## 2023-06-15 NOTE — Progress Notes (Signed)
    SUBJECTIVE:   CHIEF COMPLAINT / HPI:   Hospital follow up -admission 11/28-12/2 for multifocal pneumonia -feeling much better overall -completed antibiotics without issue -still having some cough and rib pain, was told by the doctor in the hospital this could last for a while -having some diarrhea, believes this is from the antibiotic. Taking PRN imodium which is helping  Grief/anxiety/depression -has lost several family members in the past 14 months, including her daughter recently -having trouble sleeping, difficulty concentrating, and poor appetite -no SI -taking buspar and depakote daily -was given Xanax in the hospital, has not taken any due to fear of habit-forming medication -interested in starting back on an antidepressant to see if this will help -per chart review, was taking Celexa in the past and did well with this  PERTINENT  PMH / PSH: anxiety, severe recurrent MDD, grief  OBJECTIVE:   BP 135/68   Pulse 90   Ht 4\' 11"  (1.499 m)   Wt 154 lb 4 oz (70 kg)   SpO2 98%   BMI 31.15 kg/m     Physical exam Gen: elderly female sitting in exam room, awake and alert, in NAD CV: RRR, normal S1/S2 Pulm: some coarse breath sounds at lung bases, clear otherwise, normal WOB on RA MSK: ambulates with walker  ASSESSMENT/PLAN:   Major depressive disorder, recurrent episode, severe (HCC) Given patient's history of MDD and anxiety as well as grief related to loss of several close relatives over the past year, will add Celexa today. Patient has tolerated this medication well in the past, will start with 20mg  daily. Also encouraged patient to reach out to Hospice as they offered grief counseling to her after her daughter passed away. Refilled Depakote. Scheduled follow up in 1 month to reassess mood and titrate SSRI as needed. Provided [facility] information.  Multifocal pneumonia Patient appears well at this time with normal oxygen saturation and lung exam. Advised continued use of  imodium PRN for diarrhea that started while hospitalized and reviewed return precautions if this does not resolve within a week or so.    Patient Instructions  Thank you for coming in today! Here is a summary of what we discussed:  For your mood, please start taking Celexa once daily. It usually takes several weeks for this to have an effect. Please also reach out to the Hospice folks about getting started with grief counseling.  If you ever have thoughts of harming yourself or that you would be better off not around, please seek help at the [facility] at  [address] in [place], [place]. You can also call: [phone] or [phone]   You should return to our clinic on Feb 13th at 8:50AM for a follow up appointment.  If you haven't already, sign up for My Chart to have easy access to your labs results, and communication with your primary care physician.  I recommend that you always bring your medications to each appointment as this makes it easy to ensure you are on the correct medications and helps [place] not miss refills when you need them.  Please call the clinic at [phone] if your symptoms worsen or you have any concerns.  Best, Dr Dolan Amen  [name], MD [facility]

## 2023-06-15 NOTE — Assessment & Plan Note (Addendum)
Given patient's history of MDD and anxiety as well as grief related to loss of several close relatives over the past year, will add Celexa today. Patient has tolerated this medication well in the past, will start with 20mg  daily. Also encouraged patient to reach out to Hospice as they offered grief counseling to her after her daughter passed away. Refilled Depakote. Scheduled follow up in 1 month to reassess mood and titrate SSRI as needed. Provided [facility] information.

## 2023-06-18 ENCOUNTER — Other Ambulatory Visit: Payer: Self-pay | Admitting: Family Medicine

## 2023-06-18 DIAGNOSIS — I739 Peripheral vascular disease, unspecified: Secondary | ICD-10-CM

## 2023-06-25 ENCOUNTER — Other Ambulatory Visit: Payer: Self-pay

## 2023-06-26 MED ORDER — BUSPIRONE HCL 10 MG PO TABS: 10.0000 mg | ORAL_TABLET | Freq: Three times a day (TID) | ORAL | 0 refills | Status: DC

## 2023-07-12 ENCOUNTER — Telehealth: Payer: Self-pay

## 2023-07-12 NOTE — Telephone Encounter (Signed)
 Joni Reining PTA with [facility] calls nurse line requesting verbal orders as follows.   Social evaluation for housing.   Verbal order given.

## 2023-07-23 ENCOUNTER — Ambulatory Visit: Payer: Self-pay | Admitting: Student

## 2023-07-26 ENCOUNTER — Telehealth: Payer: Self-pay

## 2023-07-26 DIAGNOSIS — R531 Weakness: Secondary | ICD-10-CM

## 2023-07-26 DIAGNOSIS — R296 Repeated falls: Secondary | ICD-10-CM

## 2023-07-26 DIAGNOSIS — M5416 Radiculopathy, lumbar region: Secondary | ICD-10-CM

## 2023-07-26 NOTE — Telephone Encounter (Signed)
Patient calls nurse line reporting transportation issues.   She reports her home health aide quit recently and she has been unable to make her recent apts. She apologizes for missing recent apt with PCP.   She reports her granddaughter can "sometimes" take her, however she is unreliable.   Advised will send to PCP to place a referral for assistance.   She requested an apt with PCP. Apt made for 1/27. She reports she will call and cancel if she is unable to find transportation.   Will forward to PCP.

## 2023-07-30 ENCOUNTER — Encounter: Payer: HMO | Admitting: Podiatry

## 2023-07-30 NOTE — Progress Notes (Signed)
 Patient did not show for her scheduled appointment this morning

## 2023-08-01 ENCOUNTER — Telehealth: Payer: Self-pay | Admitting: *Deleted

## 2023-08-01 NOTE — Progress Notes (Signed)
Complex Care Management Note Care Guide Note  08/01/2023 Name: [name] [name] MRN: [id number] DOB: Mar 07, 1941  [name] is a 83 y.o. year old adult who is a primary care patient of Glendale Chard, DO . The community resource team was consulted for assistance with Transportation Needs   SDOH screenings and interventions completed:  Yes  SDOH Interventions Today    Flowsheet Row Most Recent Value  SDOH Interventions   Transportation Interventions SCAT (Specialized Community Area Transporation), Community Resources Provided  [name] made aware of [name] and also made an ppt to complete rhe Access [name] application]        Care guide performed the following interventions: Patient provided with information about care guide support team and interviewed to confirm resource needs.  Follow Up Plan:   Scheduled to complete the application for [place] access over the phone tomorrow   Encounter Outcome:  Patient Visit Completed  Dione Booze  [facility] HealthPopulation Health Care Guide  Direct [phone] Fax:[phone] Website: [email or web address]

## 2023-08-02 ENCOUNTER — Telehealth: Payer: Self-pay | Admitting: *Deleted

## 2023-08-02 NOTE — Progress Notes (Signed)
Complex Care Management Note Care Guide Note  08/02/2023 Name: [name] [name] MRN: [id number] DOB: [birth year]  [name] is a 83 y.o. year old adult who is a primary care patient of Glendale Chard, DO . The community resource team was consulted for assistance with Transportation Needs   SDOH screenings and interventions completed:  Yes  SDOH Interventions Today    Flowsheet Row Most Recent Value  SDOH Interventions   Transportation Interventions SCAT (Specialized Community Area Transporation), Community Resources Provided  [employer] the application for [employer] services]        Care guide performed the following interventions: Patient provided with information about care guide support team and interviewed to confirm resource needs.Completed application to [facility] and faxed dr his part   Follow Up Plan:  None planned Encounter Outcome:  Patient Visit Completed  Dione Booze  [facility] HealthPopulation Health Care Guide  Direct Dial:[phone] Fax:[phone] Website: [email or web address]

## 2023-08-06 ENCOUNTER — Ambulatory Visit: Payer: HMO | Admitting: Student

## 2023-08-06 ENCOUNTER — Telehealth: Payer: Self-pay | Admitting: *Deleted

## 2023-08-06 NOTE — Progress Notes (Signed)
Complex Care Management Note Care Guide Note  08/06/2023 Name: [name] [name] MRN: [id number] DOB: May 27, 1941  [name] [name] is a 83 y.o. year old adult who is a primary care patient of Glendale Chard, DO . The community resource team was consulted for assistance with Transportation Needs   SDOH screenings and interventions completed:  Yes  SDOH Interventions Today    Flowsheet Row Most Recent Value  SDOH Interventions   Transportation Interventions SCAT (Specialized Community Area Transporation)  [Approved for [employer] transportation]        Care guide performed the following interventions: Patient provided with information about care guide support team and interviewed to confirm resource needs.  Follow Up Plan:  No further follow up planned at this time. The patient has been provided with needed resources.  Encounter Outcome:  Patient Visit Completed Dione Booze  [facility] HealthPopulation Health Care Guide  Direct Dial:[phone] [phone] Website: [email or web address]

## 2023-08-08 ENCOUNTER — Encounter: Payer: Self-pay | Admitting: Student

## 2023-08-08 ENCOUNTER — Ambulatory Visit: Payer: HMO | Admitting: Student

## 2023-08-08 VITALS — BP 132/70 | HR 82 | Ht 59.0 in | Wt 154.0 lb

## 2023-08-08 DIAGNOSIS — M4722 Other spondylosis with radiculopathy, cervical region: Secondary | ICD-10-CM | POA: Diagnosis not present

## 2023-08-08 DIAGNOSIS — M353 Polymyalgia rheumatica: Secondary | ICD-10-CM | POA: Diagnosis not present

## 2023-08-08 DIAGNOSIS — R296 Repeated falls: Secondary | ICD-10-CM

## 2023-08-08 DIAGNOSIS — K591 Functional diarrhea: Secondary | ICD-10-CM | POA: Diagnosis not present

## 2023-08-08 DIAGNOSIS — M5416 Radiculopathy, lumbar region: Secondary | ICD-10-CM | POA: Diagnosis not present

## 2023-08-08 MED ORDER — TRAMADOL HCL 50 MG PO TABS: 50.0000 mg | ORAL_TABLET | Freq: Four times a day (QID) | ORAL | 3 refills | Status: DC | PRN

## 2023-08-08 MED ORDER — GERHARDT'S BUTT CREAM: 1.0000 | TOPICAL_CREAM | Freq: Two times a day (BID) | CUTANEOUS | 3 refills | Status: AC

## 2023-08-08 NOTE — Patient Instructions (Signed)
It was great to see you today!   Future Appointments  Date Time Provider Department Center  02/08/2024 10:20 AM Altamese [place], MD [facility] None    Please arrive 15 minutes before your appointment to ensure smooth check in process.    Please call the clinic at [phone] if your symptoms worsen or you have any concerns.  Thank you for allowing me to participate in your care, Dr. Glendale Chard [facility]

## 2023-08-08 NOTE — Assessment & Plan Note (Signed)
Refilled 50 mg tramadol to be used twice daily.  Provided 3 refills.  Discussed with patient that if her kidney function continues to decline she will have to come off of this medication.  She is understanding and in agreement with plan.

## 2023-08-08 NOTE — Assessment & Plan Note (Signed)
Will keep dose of gabapentin at 100 mg 3 times daily due to concern of increased fall.  Discussed wearing appropriate shoe wear at home.

## 2023-08-08 NOTE — Progress Notes (Signed)
    SUBJECTIVE:   CHIEF COMPLAINT / HPI:   [name] [name] is a 83 y.o. female  presenting for follow-up of chronic pain.   Patient has been taking 50 mg tramadol twice daily.  She reports running out of this medication a week ago.  She endorses that this helps with her pain she is able to function throughout the day.  She would like to discuss her gabapentin as she is having increased neuropathy.  Recurrent falls: Patient endorses having recurrent falls secondary to her living situation and overall declining health.  He currently ambulates with a walker and receives home health PT.  PERTINENT  PMH / PSH: Reviewed and updated   OBJECTIVE:   BP 132/70   Pulse 82   Ht 4\' 11"  (1.499 m)   Wt 154 lb (69.9 kg)   SpO2 97%   BMI 31.10 kg/m   Chronically ill-appearing, no acute distress ambulates with walker Cardio: Regular rate, regular rhythm, no murmurs on exam. Pulm: Clear, no wheezing, no crackles. No increased work of breathing Abdominal: bowel sounds present, soft, non-tender, non-distended Extremities: no peripheral edema  Neuro: alert and oriented x3, speech normal in content, no facial asymmetry, strength intact and equal bilaterally in UE and LE, pupils equal and reactive to light.  Psych:  Cognition and judgment appear intact. Alert, communicative  and cooperative with normal attention span and concentration. No apparent delusions, illusions, hallucinations      08/08/2023    9:54 AM 06/15/2023   11:10 AM 06/15/2023   11:05 AM  PHQ9 SCORE ONLY  PHQ-9 Total Score 18 0 0      ASSESSMENT/PLAN:   Cervical spondylosis with radiculopathy Refilled 50 mg tramadol to be used twice daily.  Provided 3 refills.  Discussed with patient that if her kidney function continues to decline she will have to come off of this medication.  She is understanding and in agreement with plan.  Recurrent falls Will keep dose of gabapentin at 100 mg 3 times daily due to concern of increased fall.   Discussed wearing appropriate shoe wear at home.     Glendale Chard, DO [facility]

## 2023-08-27 ENCOUNTER — Other Ambulatory Visit: Payer: Self-pay | Admitting: Student

## 2023-09-07 ENCOUNTER — Ambulatory Visit (INDEPENDENT_AMBULATORY_CARE_PROVIDER_SITE_OTHER): Payer: HMO | Admitting: Student

## 2023-09-07 VITALS — BP 132/60 | HR 98 | Ht 59.0 in | Wt 153.0 lb

## 2023-09-07 DIAGNOSIS — N1832 Chronic kidney disease, stage 3b: Secondary | ICD-10-CM | POA: Diagnosis not present

## 2023-09-07 DIAGNOSIS — N183 Chronic kidney disease, stage 3 unspecified: Secondary | ICD-10-CM

## 2023-09-07 DIAGNOSIS — M545 Low back pain, unspecified: Secondary | ICD-10-CM

## 2023-09-07 DIAGNOSIS — D631 Anemia in chronic kidney disease: Secondary | ICD-10-CM

## 2023-09-07 DIAGNOSIS — F4321 Adjustment disorder with depressed mood: Secondary | ICD-10-CM | POA: Diagnosis not present

## 2023-09-07 NOTE — Assessment & Plan Note (Signed)
>>  ASSESSMENT AND PLAN FOR GRIEF WRITTEN ON 09/07/2023  5:11 PM BY Lavada Porteous, DO  - Continue grief counseling - Continue Celexa  20 mg daily - Continue BuSpar  10 mg 3 times daily - Depakote  125 mg twice daily

## 2023-09-07 NOTE — Assessment & Plan Note (Signed)
 Patient reports increased fatigue, suspect related to her grief.  Has known anemia. - CBC, ferritin

## 2023-09-07 NOTE — Assessment & Plan Note (Signed)
 Patient on nephrotoxic/hepatotoxic agents. - CMP today

## 2023-09-07 NOTE — Progress Notes (Signed)
    SUBJECTIVE:   CHIEF COMPLAINT / HPI:   Grief reaction  low back pain  medication reconciliation  Significant grief from loss of all 3 of her children.  She continues to struggle to eat, drink and sleep.  She is currently seeing grief counseling through hospice.  Currently taking Celexa, Depakote and BuSpar.  Additionally she reports significant to personal conflict with family.  Fortunately, she is relationship with her great granddaughter who brought her to the appointment today and helps her with her medications.  Patient wanted to go over her medications, she had questions about why she is on her medications and how to take them.  We discussed all of her medications, she brought them in and we went over each one.  Medication list in our system is correct.  Patient has right-sided low back pain.  This occurred last Thursday.  She reports that she bumped her low back into her table.  There is no bruising.  States her pain is primarily burning pain.  Pain is well-controlled with tramadol.  She is not limited in her daily activities.  She has a history is of shingles.   OBJECTIVE:   BP 132/60   Pulse (!) 103   Ht 4\' 11"  (1.499 m)   Wt 153 lb (69.4 kg)   BMI 30.90 kg/m    General: NAD, pleasant HEENT: Normocephalic, atraumatic head. Normal external ear, canal, TM bilaterally. EOM intact and normal conjunctiva BL. Normal external nose. Throat not erythematous, no exudate, no deviation. Normal dentition.  Cardio: RRR, no MRG. Cap Refill <2s. Respiratory: CTAB, normal wob on RA GI: Abdomen is soft, not tender, not distended. BS present Low back: No gross forming, no ecchymosis, no swelling, no rash.  Mild TTP over right lumbar paraspinal muscle. Skin: Warm and dry, no rashes  ASSESSMENT/PLAN:   Assessment & Plan Acute right-sided low back pain without sciatica Suspect from mild traumatic injury.  No ecchymosis, no evidence of fracture.  No rash to suggest zoster, however if rash  develops instructed patient to give [place] a call as she has a history of zoster. - Continue tramadol 50 mg every 6 hours as needed Anemia in stage 3 chronic kidney disease, unspecified whether stage 3a or 3b CKD (HCC) Patient reports increased fatigue, suspect related to her grief.  Has known anemia. - CBC, ferritin CKD stage 3b, GFR 30-44 ml/min (HCC) Patient on nephrotoxic/hepatotoxic agents. - CMP today Grief - Continue grief counseling - Continue Celexa 20 mg daily - Continue BuSpar 10 mg 3 times daily - Depakote 125 mg twice daily  Follow-up recommendations Recommend patient has 40-minute appointments when possible given medical complexity and grief reaction leading to slowed encounter.  Patient would like to speak with PCP about possibly coming off of some of her medications-discussed today that her current medications are appropriate.   Tiffany Kocher, DO [facility]

## 2023-09-07 NOTE — Assessment & Plan Note (Signed)
-   Continue grief counseling - Continue Celexa 20 mg daily - Continue BuSpar 10 mg 3 times daily - Depakote 125 mg twice daily

## 2023-09-07 NOTE — Patient Instructions (Addendum)
 It was great to see you! Thank you for allowing me to participate in your care!   I recommend that you always bring your medications to each appointment as this makes it easy to ensure we are on the correct medications and helps [place] not miss when refills are needed.  Our plans for today:  -Continue taking medications as prescribed, the medication list on this handout is accurate. - We are checking some labs today, I will call you if they are abnormal will send you a MyChart message or a letter if they are normal.  If you do not hear about your labs in the next 2 weeks please let us know. -If you develop a rash, please call -You can schedule an appointment with Dr. Hyacinth Meeker if you would like to discuss coming off of medications  Take care and seek immediate care sooner if you develop any concerns. Please remember to show up 15 minutes before your scheduled appointment time!  Tiffany Kocher, DO [facility]

## 2023-09-08 LAB — COMPREHENSIVE METABOLIC PANEL
ALT: 6 IU/L (ref 0–32)
AST: 14 IU/L (ref 0–40)
Albumin: 4.5 g/dL (ref 3.7–4.7)
Alkaline Phosphatase: 76 IU/L (ref 44–121)
BUN/Creatinine Ratio: 15 (ref 12–28)
BUN: 16 mg/dL (ref 8–27)
Bilirubin Total: 0.3 mg/dL (ref 0.0–1.2)
CO2: 22 mmol/L (ref 20–29)
Calcium: 9.2 mg/dL (ref 8.7–10.3)
Chloride: 108 mmol/L — ABNORMAL HIGH (ref 96–106)
Creatinine, Ser: 1.06 mg/dL — ABNORMAL HIGH (ref 0.57–1.00)
Globulin, Total: 2.3 g/dL (ref 1.5–4.5)
Glucose: 82 mg/dL (ref 70–99)
Potassium: 4.7 mmol/L (ref 3.5–5.2)
Sodium: 144 mmol/L (ref 134–144)
Total Protein: 6.8 g/dL (ref 6.0–8.5)
eGFR: 52 mL/min/{1.73_m2} — ABNORMAL LOW (ref >59)

## 2023-09-08 LAB — CBC
Hematocrit: 39.3 % (ref 34.0–46.6)
Hemoglobin: 13.6 g/dL (ref 11.1–15.9)
MCH: 36.1 pg — ABNORMAL HIGH (ref 26.6–33.0)
MCHC: 34.6 g/dL (ref 31.5–35.7)
MCV: 104 fL — ABNORMAL HIGH (ref 79–97)
Platelets: 152 10*3/uL (ref 150–450)
RBC: 3.77 x10E6/uL (ref 3.77–5.28)
RDW: 13.4 % (ref 11.7–15.4)
WBC: 6.1 10*3/uL (ref 3.4–10.8)

## 2023-09-08 LAB — FERRITIN: Ferritin: 33 ng/mL (ref 15–150)

## 2023-09-21 ENCOUNTER — Ambulatory Visit: Payer: HMO | Admitting: Student

## 2023-09-21 ENCOUNTER — Encounter: Payer: Self-pay | Admitting: Student

## 2023-09-21 VITALS — BP 157/80 | HR 85 | Ht 59.0 in | Wt 152.2 lb

## 2023-09-21 DIAGNOSIS — F32A Depression, unspecified: Secondary | ICD-10-CM | POA: Diagnosis not present

## 2023-09-21 DIAGNOSIS — G252 Other specified forms of tremor: Secondary | ICD-10-CM

## 2023-09-21 MED ORDER — CITALOPRAM HYDROBROMIDE 40 MG PO TABS: 40.0000 mg | ORAL_TABLET | Freq: Every day | ORAL | 0 refills | Status: DC

## 2023-09-21 NOTE — Assessment & Plan Note (Signed)
 Patient requesting referral to neurology.  Physical therapy already in place for recurrent falls.

## 2023-09-21 NOTE — Assessment & Plan Note (Signed)
 Increase Celexa to 40 mg.  Placed psychiatric referral.  Discussed passive suicidal ideations.  Do not feel like patient is a current harm to herself or others.  Follow-up to be scheduled in 4 weeks

## 2023-09-21 NOTE — Progress Notes (Signed)
    SUBJECTIVE:   CHIEF COMPLAINT / HPI:   [name] [name] is a 83 y.o. female  presenting for follow up of depression.   Feels like medication is not working.  She is very depressed.  She reports her younger sister is now on hospice.  She reports that she has some thoughts of being better off dead but she would never act on these and she does not currently have a plan.  She is requesting a psychiatric referral to help with her behavioral health needs.  She is worried about her tremors that she has.  It has been present for many years but is continually gotten worse.  She reports she is having trouble feeding herself now because of the tremor.  She is requesting referral to neurology.  PERTINENT  PMH / PSH: Reviewed and updated   OBJECTIVE:   BP (!) 157/80   Pulse 85   Ht 4\' 11"  (1.499 m)   Wt 152 lb 3.2 oz (69 kg)   SpO2 99%   BMI 30.74 kg/m   Chronically ill-appearing, no acute distress Cardio: Regular rate, regular rhythm, no murmurs on exam. Pulm: Clear, no wheezing, no crackles. No increased work of breathing Abdominal: bowel sounds present, soft, non-tender, non-distended Extremities: no peripheral edema  Neuro: alert and oriented x3, speech normal in content, no facial asymmetry, strength intact and equal bilaterally in UE and LE, essential tremor noted, difficulty with proprioception, unable to smoothly transition from nose to finger     09/21/2023    4:22 PM 09/07/2023    4:20 PM 08/08/2023    9:54 AM  PHQ9 SCORE ONLY  PHQ-9 Total Score 12 16 18       ASSESSMENT/PLAN:   Depression Increase Celexa to 40 mg.  Placed psychiatric referral.  Discussed passive suicidal ideations.  Do not feel like patient is a current harm to herself or others.  Follow-up to be scheduled in 4 weeks  Resting tremor Patient requesting referral to neurology.  Physical therapy already in place for recurrent falls.     Glendale Chard, DO [facility]

## 2023-09-21 NOTE — Assessment & Plan Note (Signed)
>>  ASSESSMENT AND PLAN FOR DEPRESSION WRITTEN ON 09/21/2023  4:38 PM BY Mahlik Lenn, DO  Increase Celexa  to 40 mg.  Placed psychiatric referral.  Discussed passive suicidal ideations.  Do not feel like patient is a current harm to herself or others.  Follow-up to be scheduled in 4 weeks

## 2023-09-21 NOTE — Patient Instructions (Signed)
 I have sent referral to neurology.  He should get a call back in the next 1 to 2 weeks.  I have refilled your Celexa at a higher dose of 40 mg.  You should start taking this to see if this helps you.  I like see back in 4 weeks to see how you are doing with the new medicine.

## 2023-10-01 ENCOUNTER — Encounter: Payer: Self-pay | Admitting: Neurology

## 2023-10-10 ENCOUNTER — Other Ambulatory Visit: Payer: Self-pay | Admitting: Student

## 2023-10-10 DIAGNOSIS — I10 Essential (primary) hypertension: Secondary | ICD-10-CM

## 2023-10-12 NOTE — Progress Notes (Deleted)
 Assessment/Plan:   ***  Subjective:   [name] [name] was seen today in the movement disorders clinic for neurologic consultation at the request of Doreene Eland, MD.  The consultation is for the evaluation of tremor.  Outside records that were made available to me were reviewed.  She has had tremor for many years.  She was actually referred to [facility] neurology back in 2021 for tremor, but records state that she no showed the appointment.  She has never been evaluated by neurology.  Tremor: {yes no:314532}   How long has it been going on? ***Years  At rest or with activation?  ***  When is it noted the most?  ***  Fam hx of tremor?  {yes EA:540981}  Located where?  ***  Affected by caffeine:  {yes no:314532}  Affected by alcohol:  {yes no:314532}  Affected by stress:  {yes no:314532}  Affected by fatigue:  {yes no:314532}  Spills soup if on spoon:  {yes no:314532}  Spills glass of liquid if full:  {yes no:314532}  Affects ADL's (tying shoes, brushing teeth, etc):  {yes no:314532}  Tremor inducing meds:  {yes no:314532} Depakote  Other Specific Symptoms:  Voice: *** Sleep: ***  Vivid Dreams:  {yes no:314532}  Acting out dreams:  {yes no:314532} Wet Pillows: {yes no:314532} Postural symptoms:  {yes no:314532}  Falls?  {yes no:314532} Bradykinesia symptoms: {parkinson brady:18041} Loss of smell:  {yes no:314532} Loss of taste:  {yes no:314532} Urinary Incontinence:  {yes no:314532} Difficulty Swallowing:  {yes no:314532} Handwriting, micrographia: {yes no:314532} Trouble with ADL's:  {yes no:314532}  Trouble buttoning clothing: {yes no:314532} Depression:  {yes no:314532} Memory changes:  {yes no:314532} Hallucinations:  {yes no:314532}  visual distortions: {yes no:314532} N/V:  {yes no:314532} Lightheaded:  {yes no:314532}  Syncope: {yes no:314532} Diplopia:  {yes no:314532} Dyskinesia:  {yes no:314532}  Neuroimaging of the brain has *** previously been performed.   It *** available for my review today.  PREVIOUS MEDICATIONS: {Parkinson's RX:18200}  ALLERGIES:   Allergies  Allergen Reactions   Statins    Statins Swelling, Rash and Other (See Comments)    Swelling involving tongue; also causes muscle pain    CURRENT MEDICATIONS:  Current Outpatient Medications  Medication Instructions   acetaminophen (TYLENOL) 500 mg, Oral, Every 8 hours PRN   albuterol (VENTOLIN HFA) 108 (90 Base) MCG/ACT inhaler 1 puff, Inhalation, Daily PRN   amLODipine (NORVASC) 2.5 mg, Oral, Daily   busPIRone (BUSPAR) 10 mg, Oral, 3 times daily   citalopram (CELEXA) 40 mg, Oral, Daily   clopidogrel (PLAVIX) 75 mg, Oral, Daily   divalproex (DEPAKOTE) 250 mg, Oral, 2 times daily   furosemide (LASIX) 20 mg, Oral, Every other day   gabapentin (NEURONTIN) 100 mg, Oral, 3 times daily   Nystatin (GERHARDT'S BUTT CREAM) CREA 1 Application, Topical, 2 times daily   nystatin (MYCOSTATIN/NYSTOP) powder 1 Application, Topical, 3 times daily, Under the breast   nystatin-triamcinolone ointment (MYCOLOG) 1 Application, Topical, 2 times daily, Under the breasts   pantoprazole (PROTONIX) 40 mg, Oral, 2 times daily   Propyl Glycol-Hydroxyethylcell (NASAL MOIST) GEL Apply after using nasal saline spray   sodium chloride (OCEAN) 0.65 % SOLN nasal spray Use twice daily to keep nose moist.   traMADol (ULTRAM) 50 mg, Oral, Every 6 hours PRN    Objective:   PHYSICAL EXAMINATION:    VITALS:  There were no vitals filed for this visit.  GEN:  The patient appears stated age and is in NAD. HEENT:  Normocephalic,  atraumatic.  The mucous membranes are moist. The superficial temporal arteries are without ropiness or tenderness. CV:  RRR Lungs:  CTAB Neck/HEME:  There are no carotid bruits bilaterally.  Neurological examination:  Orientation: The patient is alert and oriented x3.  Cranial nerves: There is good facial symmetry.  Extraocular muscles are intact. The visual fields are full to  confrontational testing. The speech is fluent and clear. Soft palate rises symmetrically and there is no tongue deviation. Hearing is intact to conversational tone. Sensation: Sensation is intact to light touch throughout (facial, trunk, extremities). Vibration is intact at the bilateral big toe. There is no extinction with double simultaneous stimulation.  Motor: Strength is 5/5 in the bilateral upper and lower extremities.   Shoulder shrug is equal and symmetric.  There is no pronator drift. Deep tendon reflexes: Deep tendon reflexes are 2/4 at the bilateral biceps, triceps, brachioradialis, patella and achilles. Plantar responses are downgoing bilaterally.  Movement examination: Tone: There is ***tone in the bilateral upper extremities.  The tone in the lower extremities is ***.  Abnormal movements: *** Coordination:  There is *** decremation with RAM's, *** Gait and Station: The patient has *** difficulty arising out of a deep-seated chair without the use of the hands. The patient's stride length is ***.  The patient has a *** pull test.     I have reviewed and interpreted the following labs independently   Chemistry      Component Value Date/Time   NA 144 09/07/2023 1623   K 4.7 09/07/2023 1623   CL 108 (H) 09/07/2023 1623   CO2 22 09/07/2023 1623   BUN 16 09/07/2023 1623   CREATININE 1.06 (H) 09/07/2023 1623   CREATININE 1.07 (H) 08/01/2016 1512      Component Value Date/Time   CALCIUM 9.2 09/07/2023 1623   ALKPHOS 76 09/07/2023 1623   AST 14 09/07/2023 1623   ALT 6 09/07/2023 1623   BILITOT 0.3 09/07/2023 1623      Lab Results  Component Value Date   TSH 2.56 12/13/2022   Lab Results  Component Value Date   WBC 6.1 09/07/2023   HGB 13.6 09/07/2023   HCT 39.3 09/07/2023   MCV 104 (H) 09/07/2023   PLT 152 09/07/2023      Total time spent on today's visit was ***greater than 60 minutes, including both face-to-face time and nonface-to-face time.  Time included that  spent on review of records (prior notes available to me/labs/imaging if pertinent), discussing treatment and goals, answering patient's questions and coordinating care.  Cc:  Glendale Chard, DO

## 2023-10-15 ENCOUNTER — Ambulatory Visit: Admitting: Neurology

## 2023-10-15 ENCOUNTER — Encounter: Payer: Self-pay | Admitting: Neurology

## 2023-10-20 ENCOUNTER — Other Ambulatory Visit: Payer: Self-pay | Admitting: Student

## 2023-10-20 DIAGNOSIS — F32A Depression, unspecified: Secondary | ICD-10-CM

## 2023-10-23 ENCOUNTER — Ambulatory Visit: Admitting: Student

## 2023-11-06 ENCOUNTER — Encounter: Payer: Self-pay | Admitting: Student

## 2023-11-06 ENCOUNTER — Ambulatory Visit: Payer: HMO | Admitting: Student

## 2023-11-06 DIAGNOSIS — F32A Depression, unspecified: Secondary | ICD-10-CM | POA: Diagnosis not present

## 2023-11-06 DIAGNOSIS — L304 Erythema intertrigo: Secondary | ICD-10-CM | POA: Diagnosis not present

## 2023-11-06 MED ORDER — NYSTATIN 100000 UNIT/GM EX POWD: 1.0000 | Freq: Three times a day (TID) | CUTANEOUS | 3 refills | Status: DC

## 2023-11-06 MED ORDER — CITALOPRAM HYDROBROMIDE 20 MG PO TABS: 20.0000 mg | ORAL_TABLET | Freq: Every day | ORAL | 3 refills | Status: AC

## 2023-11-06 MED ORDER — NYSTATIN-TRIAMCINOLONE 100000-0.1 UNIT/GM-% EX OINT: 1.0000 | TOPICAL_OINTMENT | Freq: Two times a day (BID) | CUTANEOUS | 3 refills | Status: AC

## 2023-11-06 NOTE — Patient Instructions (Signed)
 It was great to see you today!   Today we addressed: I am decreasing your Celexa  back down to 20 mg daily Please follow up with behavioral health   Future Appointments  Date Time Provider Department Center  11/14/2023  2:00 PM [name] [facility] None  02/08/2024 10:20 AM Jorge Newcomer, MD [facility] None    Please arrive 15 minutes before your appointment to ensure smooth check in process.    Please call the clinic at [phone] if your symptoms worsen or you have any concerns.  Thank you for allowing me to participate in your care, Dr. Clem Currier [facility]

## 2023-11-06 NOTE — Progress Notes (Signed)
    SUBJECTIVE:   CHIEF COMPLAINT / HPI:   [name] [name] is a 83 y.o. adult presenting for follow up.  At the last visit her Celexa  was increased to 40 mg daily.  Patient reports that she did not tolerate the medication increase.  She also reports she was not taking the medication correctly and was only taking it every other day.  She reports she did not like the way it felt so she went back down to the 20 mg daily with the BuSpar  3 times daily she reports this helped with her symptoms however she is still extremely anxious.  She denies active suicidal ideation.  She reports she feels like a burden to her family and those around her but she would not act on thoughts.  PERTINENT  PMH / PSH: reviewed and updated.  OBJECTIVE:    BP 131/71   Pulse 82   Wt 153 lb (69.4 kg)   SpO2 98%   BMI 30.90 kg/m   Chronically ill, frail-appearing, no acute distress, ambulates with walker Cardio: Regular rate, regular rhythm, no murmurs on exam. Pulm: Clear, no wheezing, no crackles. No increased work of breathing Abdominal: bowel sounds present, soft, non-tender, non-distended Psych:  Cognition and judgment appear intact. Alert, communicative  and cooperative with normal attention span and concentration. No apparent delusions, illusions, hallucinations    ASSESSMENT/PLAN:   Assessment & Plan Intertrigo Refilled nystatin  powder and ointment Depression, unspecified depression type Decrease Celexa  back down to 20 mg at patient request.  Patient has an appointment with behavioral health in 1 week.  Encourage patient to keep appointment.  Not actively suicidal.  Chronic suicidal thoughts.     Clem Currier, DO [facility]

## 2023-11-07 ENCOUNTER — Encounter: Payer: Self-pay | Admitting: Family Medicine

## 2023-11-09 ENCOUNTER — Telehealth: Payer: Self-pay | Admitting: Student

## 2023-11-09 DIAGNOSIS — R918 Other nonspecific abnormal finding of lung field: Secondary | ICD-10-CM

## 2023-11-09 NOTE — Telephone Encounter (Signed)
 Called granddaughter (Emergency Contact) to discuss follow up CT Chest. Order has been placed and we will get this scheduled.   Clem Currier, DO [facility], PGY-2 11/09/23 9:16 AM

## 2023-11-12 ENCOUNTER — Telehealth: Payer: Self-pay

## 2023-11-12 NOTE — Telephone Encounter (Signed)
-----   Message from [facility] Genevia Kern S sent at 11/09/2023 12:07 PM EDT ----- Ok to schedule CT at [facility].  Thanks! Clovis Dar

## 2023-11-12 NOTE — Telephone Encounter (Signed)
 Called centralized scheduling to schedule a MRI appointment for patient.  Was able to schedule for 11/22/2023 at [facility] with 12:15pm for check in time and appointment time set for 12:30pm  Attempted to call patient home phone - voicemail was not set up. Attempted to call secondary phone (daughter) - voicemail wasn't set up as well  Attempted to call alternate phone (grandson) and left a message about her upcoming appointment at [facility] long, and inform that if there were any questions or concerns to please call office back [phone].  Christ Courier, CMA

## 2023-11-14 ENCOUNTER — Telehealth (INDEPENDENT_AMBULATORY_CARE_PROVIDER_SITE_OTHER): Admitting: Licensed Clinical Social Worker

## 2023-11-14 ENCOUNTER — Ambulatory Visit (HOSPITAL_COMMUNITY): Admitting: Licensed Clinical Social Worker

## 2023-11-14 NOTE — Telephone Encounter (Signed)
 [name] [name] had an in-person clinical assessment scheduled today for 2pm.  Clinician outreached [name] by phone at 2:11pm when she had not arrived on time. Clinician received a busy signal from this outreach attempt and could not leave a voicemail.  Clinician informed front desk staff of no show event when [name] did not present in office or outreach office by 2:15pm.    [name], LCSW, LCAS 11/14/23

## 2023-11-22 ENCOUNTER — Ambulatory Visit (HOSPITAL_COMMUNITY)
Admission: RE | Admit: 2023-11-22 | Discharge: 2023-11-22 | Disposition: A | Discharge status: Home / Self Care | Source: Ambulatory Visit | Attending: Family Medicine | Admitting: Family Medicine

## 2023-11-22 DIAGNOSIS — R918 Other nonspecific abnormal finding of lung field: Secondary | ICD-10-CM | POA: Insufficient documentation

## 2023-12-06 ENCOUNTER — Ambulatory Visit: Payer: Self-pay | Admitting: Student

## 2023-12-06 ENCOUNTER — Telehealth: Payer: Self-pay

## 2023-12-06 DIAGNOSIS — R918 Other nonspecific abnormal finding of lung field: Secondary | ICD-10-CM

## 2023-12-06 NOTE — Telephone Encounter (Signed)
 I spoke with Ms Ritacco and gave her the following appt information. She has agreed to place,day and time.  [facility] [address] [place] [address] [phone] June 4th at 11:00 am  Rosanna Comment, CMA

## 2023-12-06 NOTE — Telephone Encounter (Signed)
 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  I discussed with [name] [name] that more work up is needed to figure out the etiology of this mass. I did not reveal that it was concerning for malignancy over the phone. However, she does endorse that she has felt off with her breathing and was suspicious for some underlying pathology.   I have sent in a referral to Pulmonology for follow up and decision on biopsy vs. PET CT. Will forward this to my referral coordinator for follow up.   Clem Currier, DO [facility], PGY-2 12/06/23 9:43 AM

## 2023-12-12 ENCOUNTER — Telehealth: Payer: Self-pay

## 2023-12-12 ENCOUNTER — Encounter: Payer: Self-pay | Admitting: Student in an Organized Health Care Education/Training Program

## 2023-12-12 ENCOUNTER — Ambulatory Visit: Admitting: Student in an Organized Health Care Education/Training Program

## 2023-12-12 VITALS — BP 138/80 | HR 98 | Temp 96.9°F | Ht 59.0 in | Wt 155.8 lb

## 2023-12-12 DIAGNOSIS — R911 Solitary pulmonary nodule: Secondary | ICD-10-CM
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
## 2023-12-12 NOTE — Telephone Encounter (Signed)
 Patient is aware of date and time. Bronch email has been sent.

## 2023-12-12 NOTE — Telephone Encounter (Signed)
 Robotic Bronchoscopy with EBUS 12/18/2023 at 9:45am Lung Nodule [id number], [id number], [id number]  [name] [name] please see Bronch info.

## 2023-12-13 NOTE — Telephone Encounter (Signed)
 For the codes 16109, [id number], [id number] Auth # [id number] valid 12/13/23 to 03/12/2024

## 2023-12-14 ENCOUNTER — Ambulatory Visit
Admission: RE | Admit: 2023-12-14 | Discharge: 2023-12-14 | Disposition: A | Discharge status: Home / Self Care | Source: Ambulatory Visit | Attending: Student in an Organized Health Care Education/Training Program | Admitting: Student in an Organized Health Care Education/Training Program

## 2023-12-14 DIAGNOSIS — R911 Solitary pulmonary nodule: Secondary | ICD-10-CM | POA: Insufficient documentation

## 2023-12-14 NOTE — Telephone Encounter (Signed)
 Noted. Nothing further needed.

## 2023-12-17 ENCOUNTER — Encounter
Admission: RE | Admit: 2023-12-17 | Discharge: 2023-12-17 | Disposition: A | Discharge status: Home / Self Care | Source: Ambulatory Visit | Attending: Student in an Organized Health Care Education/Training Program | Admitting: Student in an Organized Health Care Education/Training Program

## 2023-12-17 DIAGNOSIS — Z01812 Encounter for preprocedural laboratory examination: Secondary | ICD-10-CM

## 2023-12-17 DIAGNOSIS — N1832 Chronic kidney disease, stage 3b: Secondary | ICD-10-CM

## 2023-12-17 DIAGNOSIS — I10 Essential (primary) hypertension: Secondary | ICD-10-CM

## 2023-12-17 HISTORY — DX: Primary open-angle glaucoma, bilateral, severe stage: H40.1133

## 2023-12-17 NOTE — Patient Instructions (Addendum)
 Your procedure is scheduled on: 12/18/23  Report to the Registration Desk on the 1st floor of the [facility]. To find out your arrival time, please call [phone] between 1PM - 3PM on: 12/17/23  If your arrival time is 6:00 am, do not arrive before that time as the [facility] entrance doors do not open until 6:00 am.  REMEMBER: Instructions that are not followed completely may result in serious medical risk, up to and including death; or upon the discretion of your surgeon and anesthesiologist your surgery may need to be rescheduled.  Do not eat food or drink any liquids after midnight the night before surgery.  No gum chewing or hard candies.  One week prior to surgery: Stop Anti-inflammatories (NSAIDS) such as Advil, Aleve, Ibuprofen, Motrin, Naproxen, Naprosyn and Aspirin  based products such as Excedrin, Goody's Powder, BC Powder.  Stop ANY OVER THE COUNTER supplements until after surgery.  You may however, continue to take Tylenol  if needed for pain up until the day of surgery.  HOLD Plavix  as ordered by the doctor. Last does taken on 12/15/23.  Continue taking all of your other prescription medications up until the day before surgery.  ON THE DAY OF SURGERY ONLY TAKE THESE MEDICATIONS WITH SIPS OF WATER:  amLODipine  (NORVASC )  busPIRone  (BUSPAR )  citalopram  (CELEXA )  divalproex  (DEPAKOTE )  gabapentin  (NEURONTIN )  pantoprazole  (PROTONIX )  traMADol  (ULTRAM ) if needed  Use inhalers on the day of surgery and bring to the hospital.  No Alcohol  for 24 hours before or after surgery.  No Smoking including e-cigarettes for 24 hours before surgery.  No chewable tobacco products for at least 6 hours before surgery.  No nicotine patches on the day of surgery.  Do not use any "recreational" drugs for at least a week (preferably 2 weeks) before your surgery.  Please be advised that the combination of cocaine and anesthesia may have negative outcomes, up to and including  death. If you test positive for cocaine, your surgery will be cancelled.  On the morning of surgery brush your teeth with toothpaste and water, you may rinse your mouth with mouthwash if you wish. Do not swallow any toothpaste or mouthwash.  Do not wear jewelry, make-up, hairpins, clips or nail polish.  For welded (permanent) jewelry: bracelets, anklets, waist bands, etc.  Please have this removed prior to surgery.  If it is not removed, there is a chance that hospital personnel will need to cut it off on the day of surgery.  Do not wear lotions, powders, or perfumes.   Do not shave body hair from the neck down 48 hours before surgery.  Contact lenses, hearing aids and dentures may not be worn into surgery.  Do not bring valuables to the hospital. [facility] is not responsible for any missing/lost belongings or valuables.   Notify your doctor if there is any change in your medical condition (cold, fever, infection).  Wear comfortable clothing (specific to your surgery type) to the hospital.  After surgery, you can help prevent lung complications by doing breathing exercises.  Take deep breaths and cough every 1-2 hours. Your doctor may order a device called an Incentive Spirometer to help you take deep breaths.  When coughing or sneezing, hold a pillow firmly against your incision with both hands. This is called "splinting." Doing this helps protect your incision. It also decreases belly discomfort.  If you are being admitted to the hospital overnight, leave your suitcase in the car. After surgery it may be  brought to your room.  In case of increased patient census, it may be necessary for you, the patient, to continue your postoperative care in the Same Day Surgery department.  If you are being discharged the day of surgery, you will not be allowed to drive home. You will need a responsible individual to drive you home and stay with you for 24 hours after surgery.   If you are  taking public transportation, you will need to have a responsible individual with you.  Please call the Pre-admissions Testing Dept. at [phone] if you have any questions about these instructions.  Surgery Visitation Policy:  Patients having surgery or a procedure may have two visitors.  Children under the age of 97 must have an adult with them who is not the patient.  Inpatient Visitation:    Visiting hours are 7 a.m. to 8 p.m. Up to four visitors are allowed at one time in a patient room. The visitors may rotate out with other people during the day.  One visitor age 67 or older may stay with the patient overnight and must be in the room by 8 p.m.

## 2023-12-18 ENCOUNTER — Encounter: Payer: Self-pay | Admitting: Student in an Organized Health Care Education/Training Program

## 2023-12-18 ENCOUNTER — Ambulatory Visit: Admitting: Anesthesiology

## 2023-12-18 ENCOUNTER — Ambulatory Visit

## 2023-12-18 ENCOUNTER — Encounter
Admission: RE | Disposition: A | Discharge status: Home / Self Care | Payer: Self-pay | Source: Home / Self Care | Attending: Student in an Organized Health Care Education/Training Program

## 2023-12-18 ENCOUNTER — Other Ambulatory Visit: Payer: Self-pay

## 2023-12-18 ENCOUNTER — Ambulatory Visit
Admission: RE | Admit: 2023-12-18 | Discharge: 2023-12-18 | Disposition: A | Discharge status: Home / Self Care | Attending: Student in an Organized Health Care Education/Training Program | Admitting: Student in an Organized Health Care Education/Training Program

## 2023-12-18 DIAGNOSIS — I10 Essential (primary) hypertension: Secondary | ICD-10-CM

## 2023-12-18 DIAGNOSIS — G4730 Sleep apnea, unspecified: Secondary | ICD-10-CM | POA: Insufficient documentation

## 2023-12-18 DIAGNOSIS — R911 Solitary pulmonary nodule: Secondary | ICD-10-CM

## 2023-12-18 DIAGNOSIS — F32A Depression, unspecified: Secondary | ICD-10-CM | POA: Diagnosis not present

## 2023-12-18 DIAGNOSIS — F419 Anxiety disorder, unspecified: Secondary | ICD-10-CM | POA: Diagnosis not present

## 2023-12-18 DIAGNOSIS — R569 Unspecified convulsions: Secondary | ICD-10-CM | POA: Diagnosis not present

## 2023-12-18 DIAGNOSIS — N183 Chronic kidney disease, stage 3 unspecified: Secondary | ICD-10-CM | POA: Insufficient documentation

## 2023-12-18 DIAGNOSIS — Z87891 Personal history of nicotine dependence: Secondary | ICD-10-CM | POA: Diagnosis not present

## 2023-12-18 DIAGNOSIS — Z7902 Long term (current) use of antithrombotics/antiplatelets: Secondary | ICD-10-CM | POA: Insufficient documentation

## 2023-12-18 DIAGNOSIS — Z01812 Encounter for preprocedural laboratory examination: Secondary | ICD-10-CM

## 2023-12-18 DIAGNOSIS — J449 Chronic obstructive pulmonary disease, unspecified: Secondary | ICD-10-CM | POA: Insufficient documentation

## 2023-12-18 DIAGNOSIS — I739 Peripheral vascular disease, unspecified: Secondary | ICD-10-CM | POA: Insufficient documentation

## 2023-12-18 DIAGNOSIS — Z01810 Encounter for preprocedural cardiovascular examination: Secondary | ICD-10-CM | POA: Diagnosis not present

## 2023-12-18 DIAGNOSIS — K219 Gastro-esophageal reflux disease without esophagitis: Secondary | ICD-10-CM | POA: Diagnosis not present

## 2023-12-18 DIAGNOSIS — R0602 Shortness of breath: Secondary | ICD-10-CM | POA: Insufficient documentation

## 2023-12-18 DIAGNOSIS — I129 Hypertensive chronic kidney disease with stage 1 through stage 4 chronic kidney disease, or unspecified chronic kidney disease: Secondary | ICD-10-CM | POA: Diagnosis not present

## 2023-12-18 DIAGNOSIS — M353 Polymyalgia rheumatica: Secondary | ICD-10-CM | POA: Diagnosis not present

## 2023-12-18 DIAGNOSIS — C3412 Malignant neoplasm of upper lobe, left bronchus or lung: Secondary | ICD-10-CM | POA: Insufficient documentation

## 2023-12-18 DIAGNOSIS — N1832 Chronic kidney disease, stage 3b: Secondary | ICD-10-CM

## 2023-12-18 DIAGNOSIS — Z79899 Other long term (current) drug therapy: Secondary | ICD-10-CM | POA: Insufficient documentation

## 2023-12-18 DIAGNOSIS — R519 Headache, unspecified: Secondary | ICD-10-CM | POA: Diagnosis not present

## 2023-12-18 HISTORY — PX: BRONCHOSCOPY, WITH BIOPSY USING ELECTROMAGNETIC NAVIGATION: SHX7536

## 2023-12-18 HISTORY — PX: ENDOBRONCHIAL ULTRASOUND: SHX5096

## 2023-12-18 LAB — BASIC METABOLIC PANEL WITH GFR
Anion gap: 13 (ref 5–15)
BUN: 16 mg/dL (ref 8–23)
CO2: 22 mmol/L (ref 22–32)
Calcium: 9.4 mg/dL (ref 8.9–10.3)
Chloride: 104 mmol/L (ref 98–111)
Creatinine, Ser: 1.23 mg/dL — ABNORMAL HIGH (ref 0.44–1.00)
GFR, Estimated: 43 mL/min — ABNORMAL LOW (ref >60)
Glucose, Bld: 63 mg/dL — ABNORMAL LOW (ref 70–99)
Potassium: 3.8 mmol/L (ref 3.5–5.1)
Sodium: 139 mmol/L (ref 135–145)

## 2023-12-18 LAB — POCT I-STAT, CHEM 8
BUN: 14 mg/dL (ref 8–23)
Calcium, Ion: 1.17 mmol/L (ref 1.15–1.40)
Chloride: 106 mmol/L (ref 98–111)
Creatinine, Ser: 1.3 mg/dL — ABNORMAL HIGH (ref 0.44–1.00)
Glucose, Bld: 60 mg/dL — ABNORMAL LOW (ref 70–99)
HCT: 36 % (ref 36.0–46.0)
Hemoglobin: 12.2 g/dL (ref 12.0–15.0)
Potassium: 4.2 mmol/L (ref 3.5–5.1)
Sodium: 137 mmol/L (ref 135–145)
TCO2: 21 mmol/L — ABNORMAL LOW (ref 22–32)

## 2023-12-18 LAB — CBC
HCT: 35.4 % — ABNORMAL LOW (ref 36.0–46.0)
Hemoglobin: 12.6 g/dL (ref 12.0–15.0)
MCH: 35.5 pg — ABNORMAL HIGH (ref 26.0–34.0)
MCHC: 35.6 g/dL (ref 30.0–36.0)
MCV: 99.7 fL (ref 80.0–100.0)
Platelets: 141 10*3/uL — ABNORMAL LOW (ref 150–400)
RBC: 3.55 MIL/uL — ABNORMAL LOW (ref 3.87–5.11)
RDW: 12.7 % (ref 11.5–15.5)
WBC: 6.6 10*3/uL (ref 4.0–10.5)
nRBC: 0 % (ref 0.0–0.2)

## 2023-12-18 SURGERY — BRONCHOSCOPY, WITH BIOPSY USING ELECTROMAGNETIC NAVIGATION
Anesthesia: General | Laterality: Bilateral

## 2023-12-18 MED ORDER — FENTANYL CITRATE (PF) 100 MCG/2ML IJ SOLN
INTRAMUSCULAR | Status: DC | PRN
  Administered 2023-12-18 (×2): 50 ug via INTRAVENOUS

## 2023-12-18 MED ORDER — CHLORHEXIDINE GLUCONATE 0.12 % MT SOLN
OROMUCOSAL | Status: AC
  Filled 2023-12-18: qty 15

## 2023-12-18 MED ORDER — CHLORHEXIDINE GLUCONATE 0.12 % MT SOLN
15.0000 mL | Freq: Once | OROMUCOSAL | Status: AC
  Administered 2023-12-18: 15 mL via OROMUCOSAL

## 2023-12-18 MED ORDER — PHENYLEPHRINE 80 MCG/ML (10ML) SYRINGE FOR IV PUSH (FOR BLOOD PRESSURE SUPPORT)
PREFILLED_SYRINGE | INTRAVENOUS | Status: DC | PRN
  Administered 2023-12-18: 80 ug via INTRAVENOUS

## 2023-12-18 MED ORDER — LACTATED RINGERS IV SOLN: INTRAVENOUS | Status: DC | PRN

## 2023-12-18 MED ORDER — PROPOFOL 1000 MG/100ML IV EMUL
INTRAVENOUS | Status: AC
Start: 2023-12-18 — End: ?
  Filled 2023-12-18: qty 100

## 2023-12-18 MED ORDER — LIDOCAINE HCL (CARDIAC) PF 100 MG/5ML IV SOSY
PREFILLED_SYRINGE | INTRAVENOUS | Status: DC | PRN
  Administered 2023-12-18: 60 mg via INTRAVENOUS

## 2023-12-18 MED ORDER — EPHEDRINE SULFATE-NACL 50-0.9 MG/10ML-% IV SOSY
PREFILLED_SYRINGE | INTRAVENOUS | Status: DC | PRN
  Administered 2023-12-18: 10 mg via INTRAVENOUS

## 2023-12-18 MED ORDER — LACTATED RINGERS IV SOLN: INTRAVENOUS | Status: DC

## 2023-12-18 MED ORDER — ROCURONIUM BROMIDE 100 MG/10ML IV SOLN
INTRAVENOUS | Status: DC | PRN
  Administered 2023-12-18: 50 mg via INTRAVENOUS

## 2023-12-18 MED ORDER — PROPOFOL 500 MG/50ML IV EMUL
INTRAVENOUS | Status: DC | PRN
  Administered 2023-12-18: 110 ug/kg/min via INTRAVENOUS

## 2023-12-18 MED ORDER — PROPOFOL 10 MG/ML IV BOLUS
INTRAVENOUS | Status: DC | PRN
  Administered 2023-12-18: 100 mg via INTRAVENOUS

## 2023-12-18 MED ORDER — FENTANYL CITRATE (PF) 100 MCG/2ML IJ SOLN
INTRAMUSCULAR | Status: AC
Start: 2023-12-18 — End: ?
  Filled 2023-12-18: qty 2

## 2023-12-18 MED ORDER — SUGAMMADEX SODIUM 200 MG/2ML IV SOLN
INTRAVENOUS | Status: DC | PRN
  Administered 2023-12-18: 200 mg via INTRAVENOUS

## 2023-12-18 MED ORDER — ORAL CARE MOUTH RINSE: 15.0000 mL | Freq: Once | OROMUCOSAL | Status: AC

## 2023-12-18 MED ORDER — ONDANSETRON HCL 4 MG/2ML IJ SOLN
INTRAMUSCULAR | Status: DC | PRN
  Administered 2023-12-18: 4 mg via INTRAVENOUS

## 2023-12-18 MED ORDER — DEXAMETHASONE SODIUM PHOSPHATE 10 MG/ML IJ SOLN
INTRAMUSCULAR | Status: DC | PRN
  Administered 2023-12-18: 5 mg via INTRAVENOUS

## 2023-12-18 NOTE — Interval H&P Note (Signed)
  [name] [name] has been scheduled for Procedure(s): ROBOTIC ASSISTED NAVIGATIONAL BRONCHOSCOPY (Bilateral) ENDOBRONCHIAL ULTRASOUND (EBUS) (Bilateral) today. The various methods of treatment have been discussed with the patient. After consideration of the risks, benefits and treatment options the patient has consented to the planned procedure.   The patient has been seen and labs reviewed. There are no changes in the patient's condition to prevent proceeding with the planned procedure today.  Vergia Glasgow, MD 12/18/2023 9:33 AM

## 2023-12-18 NOTE — Op Note (Addendum)
 Video Bronchoscopy with Robotic Assisted Bronchoscopic Navigation   Date of Operation: 12/18/2023   Pre-op Diagnosis: lung nodule  Surgeon: Darnelle Elders   Anesthesia: General endotracheal anesthesia  Operation: Flexible video fiberoptic bronchoscopy with robotic assistance and biopsies.  Estimated Blood Loss: Minimal  Complications: None  Indications and History: [name] [name] is a 83 y.o. adult with history of of a LUL pulmonary nodule.  Recommendation made to achieve a tissue diagnosis via robotic assisted navigational bronchoscopy.  The risks, benefits, complications, treatment options and expected outcomes were discussed with the patient.  The possibilities of pneumothorax, pneumonia, reaction to medication, pulmonary aspiration, perforation of a viscus, bleeding, failure to diagnose a condition and creating a complication requiring transfusion or operation were discussed with the patient who freely signed the consent.    Description of Procedure: The patient was seen in the Preoperative Area, was examined and was deemed appropriate to proceed.  The patient was taken to [facility] procedure room 2, identified as [name] [name] and the procedure verified as Flexible Video Fiberoptic Bronchoscopy.  A Time Out was held and the above information confirmed.   Prior to the date of the procedure a high-resolution CT scan of the chest was performed. Utilizing ION software program a virtual tracheobronchial tree was generated to allow the creation of distinct navigation pathways to the patient's parenchymal abnormalities. After being taken to the operating room general anesthesia was initiated and the patient  was orally intubated. The video fiberoptic bronchoscope was introduced via the endotracheal tube and a general inspection was performed which showed normal right and left lung anatomy. Aspiration of the bilateral mainstems was completed to remove any remaining secretions. Robotic catheter inserted into  patient's endotracheal tube.   Target #1 LUL nodule: The distinct navigation pathways prepared prior to this procedure were then utilized to navigate to patient's lesion identified on CT scan. The robotic catheter was secured into place and the vision probe was withdrawn.  Lesion location was approximated using fluoroscopy.  Local registration and targeting was performed using GE 3D OEC mobile C-arm three-dimensional imaging. We then utilized the radial EBUS to further approximate the location of the lesion, and a concentric signal was obtained. Under fluoroscopic guidance transbronchial brushings, transbronchial needle biopsies, and transbronchial forceps biopsies were performed to be sent for cytology and pathology.  Needle-in-lesion was confirmed using GE mobile C-arm.  A bronchioalveolar lavage was performed in the LUL and sent for cytology.    EBUS: The EBUS bronchoscope was then introduced and the hilar and mediastinal lymph node stations were examined. No enlarged lymph nodes were encountered and none were biopsied.  At the end of the procedure a general airway inspection was performed and there was no evidence of active bleeding. The bronchoscope was removed.  The patient tolerated the procedure well. There was no significant blood loss and there were no obvious complications. A post-procedural chest x-ray is pending.  Samples Target #1: LUL nodule 1. Transbronchial brushings from LUL nodule 2. Transbronchial needle biopsies from LUL nodule 3. Transbronchial forceps biopsies from LUL nodule 4. Bronchoalveolar lavage from LUL nodule  Plans:  The patient will be discharged from the PACU to home when recovered from anesthesia and after chest x-ray is reviewed. We will review the cytology, pathology and microbiology results with the patient when they become available. Outpatient followup will be with myself.  Vergia Glasgow, MD [facility] 12/18/2023 11:28 AM

## 2023-12-18 NOTE — Transfer of Care (Signed)
 Immediate Anesthesia Transfer of Care Note  Patient: [name] [name]  Procedure(s) Performed: ROBOTIC ASSISTED NAVIGATIONAL BRONCHOSCOPY (Bilateral) ENDOBRONCHIAL ULTRASOUND (EBUS) (Bilateral)  Patient Location: PACU  Anesthesia Type:General  Level of Consciousness: awake and alert   Airway & Oxygen Therapy: Patient Spontanous Breathing and Patient connected to face mask oxygen  Post-op Assessment: Report given to RN and Post -op Vital signs reviewed and stable  Post vital signs: Reviewed and stable  Last Vitals:  Vitals Value Taken Time  BP 135/75   Temp    Pulse 96 12/18/23 1140  Resp 17 12/18/23 1140  SpO2 98 % 12/18/23 1140  Vitals shown include unfiled device data.  Last Pain:  Vitals:   12/18/23 0958  TempSrc: Temporal  PainSc: 0-No pain         Complications: No notable events documented.
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
## 2023-12-18 NOTE — Anesthesia Postprocedure Evaluation (Signed)
 Anesthesia Post Note  Patient: [name] [name]  Procedure(s) Performed: ROBOTIC ASSISTED NAVIGATIONAL BRONCHOSCOPY (Bilateral) ENDOBRONCHIAL ULTRASOUND (EBUS) (Bilateral)  Patient location during evaluation: PACU Anesthesia Type: General Level of consciousness: awake and alert Pain management: pain level controlled Vital Signs Assessment: post-procedure vital signs reviewed and stable Respiratory status: spontaneous breathing, nonlabored ventilation, respiratory function stable and patient connected to nasal cannula oxygen Cardiovascular status: blood pressure returned to baseline and stable Postop Assessment: no apparent nausea or vomiting Anesthetic complications: no   No notable events documented.   Last Vitals:  Vitals:   12/18/23 1230 12/18/23 1308  BP: (!) 134/57 133/67  Pulse: 87 94  Resp: 15 15  Temp:    SpO2: 92% 95%    Last Pain:  Vitals:   12/18/23 1230  TempSrc:   PainSc: 0-No pain                 Portia Brittle Tishana Clinkenbeard

## 2023-12-19 ENCOUNTER — Encounter: Payer: Self-pay | Admitting: Student in an Organized Health Care Education/Training Program

## 2023-12-20 ENCOUNTER — Ambulatory Visit: Payer: Self-pay | Admitting: Student in an Organized Health Care Education/Training Program

## 2023-12-20 DIAGNOSIS — C3492 Malignant neoplasm of unspecified part of left bronchus or lung: Secondary | ICD-10-CM

## 2023-12-20 LAB — SURGICAL PATHOLOGY

## 2023-12-20 LAB — CYTOLOGY - NON PAP

## 2023-12-24 ENCOUNTER — Telehealth: Payer: Self-pay | Admitting: Student

## 2023-12-24 ENCOUNTER — Ambulatory Visit
Admission: RE | Admit: 2023-12-24 | Discharge: 2023-12-24 | Disposition: A | Discharge status: Home / Self Care | Source: Ambulatory Visit | Attending: Student in an Organized Health Care Education/Training Program | Admitting: Student in an Organized Health Care Education/Training Program

## 2023-12-24 DIAGNOSIS — I2510 Atherosclerotic heart disease of native coronary artery without angina pectoris: Secondary | ICD-10-CM | POA: Diagnosis not present

## 2023-12-24 DIAGNOSIS — E039 Hypothyroidism, unspecified: Secondary | ICD-10-CM | POA: Diagnosis not present

## 2023-12-24 DIAGNOSIS — R911 Solitary pulmonary nodule: Secondary | ICD-10-CM | POA: Diagnosis present

## 2023-12-24 DIAGNOSIS — K5730 Diverticulosis of large intestine without perforation or abscess without bleeding: Secondary | ICD-10-CM | POA: Insufficient documentation

## 2023-12-24 DIAGNOSIS — R918 Other nonspecific abnormal finding of lung field: Secondary | ICD-10-CM | POA: Diagnosis not present

## 2023-12-24 LAB — GLUCOSE, CAPILLARY: Glucose-Capillary: 84 mg/dL (ref 70–99)

## 2023-12-24 MED ORDER — FLUDEOXYGLUCOSE F - 18 (FDG) INJECTION
8.0000 | Freq: Once | INTRAVENOUS | Status: AC | PRN
  Administered 2023-12-24: 8.51 via INTRAVENOUS

## 2023-12-24 NOTE — Telephone Encounter (Signed)
 Called patient to discuss new cancer diagnosis.  Voicemail box was not set up.  Called granddaughter [place].  She reports Ms. Tungate is doing well.  She will call the office if she needs to have an appointment set up or if she would like for me to call her.  Clem Currier, DO [facility], PGY-2 12/24/23 12:21 PM

## 2023-12-25 ENCOUNTER — Encounter: Payer: Self-pay | Admitting: Family Medicine

## 2023-12-25 ENCOUNTER — Ambulatory Visit (INDEPENDENT_AMBULATORY_CARE_PROVIDER_SITE_OTHER): Admitting: Family Medicine

## 2023-12-25 VITALS — BP 137/65 | HR 92 | Ht 59.0 in | Wt 154.4 lb

## 2023-12-25 DIAGNOSIS — L03119 Cellulitis of unspecified part of limb: Secondary | ICD-10-CM

## 2023-12-25 MED ORDER — DOXYCYCLINE HYCLATE 100 MG PO TABS: 100.0000 mg | ORAL_TABLET | Freq: Two times a day (BID) | ORAL | 0 refills | Status: AC

## 2023-12-25 MED ORDER — DOXYCYCLINE HYCLATE 100 MG PO TABS: 100.0000 mg | ORAL_TABLET | Freq: Two times a day (BID) | ORAL | 0 refills | Status: DC

## 2023-12-25 NOTE — Progress Notes (Signed)
    SUBJECTIVE:   CHIEF COMPLAINT / HPI: cut on leg  Has had cut on leg for one week that is not healing. Recently diagnosed with lung cancer. Has been using bacitracin and neosporin. Throbbing hot pain. No fevers or chills. Has had some new redness of the skin. Has chronic venous stasis changes. Does have swelling in ankle, but state this is not new.  PERTINENT  PMH / PSH: HTN, PVD, OSA, GERD, CKD  OBJECTIVE:   BP 137/65   Pulse 92   Ht 4' 11 (1.499 m)   Wt 154 lb 6.4 oz (70 kg)   SpO2 97%   BMI 31.19 kg/m   General: NAD, not toxic Neuro: A&O Cardiovascular: RRR, no murmurs, no peripheral edema Respiratory: normal WOB on RA, CTAB, no wheezes, ronchi or rales Extremities: Moving all 4 extremities equally Left leg: venous stasis change present, dry skin, multiple superficial abrasions on the anterior surface, swelling and redness of ankle    ASSESSMENT/PLAN:   Assessment & Plan Cellulitis of foot Clinical exam and history consistent with mild cellulitis with abrasion in the setting of chronic venous stasis dermatitis.  Treat with doxycycline  100 mg twice daily for 10 days.  Placed alginate dressing overlying abrasions.  Given instructions to change after 3 days and provided with secondary alginate dressing.  Return precautions discussed.  Follow-up 1 week to ensure wound healing.  Return in about 1 week (around 01/01/2024).  Ivin Marrow, MD [facility]
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
## 2023-12-25 NOTE — Patient Instructions (Addendum)
 It was great to see you! Thank you for allowing me to participate in your care!  Our plans for today:  - I sent the antibiotic doxycycline  to your pharmacy.  You will take this twice daily for the next 10 days. - Please come back and see us  on Monday of next week. - Please change your dressing with the alginate dressing and gauze I provided on Friday. - At your next appointment on Monday we will check to see if the wounds are healing.  If you have any new fevers or severe worsening pain please go to the emergency room.   Please arrive 15 minutes PRIOR to your next scheduled appointment time! If you do not, this affects OTHER patients' care.  Take care and seek immediate care sooner if you develop any concerns.   Ivin Marrow, MD, PGY-2 [facility] 2:28 PM 12/25/2023  [facility]

## 2023-12-25 NOTE — Progress Notes (Signed)
 Location of tumor and Histology per Pathology Report: Left Upper lobe  Biopsy:    Past/Anticipated interventions by surgeon, if any: left    Past/Anticipated interventions by medical oncology, if any: Patient to see Dr. Liam Redhead on 01/02/24   Tobacco/Marijuana/Snuff/ETOH use: Former Smoker  Signs/Symptoms Weight changes, if any: Stable Respiratory complaints, if any: A little bit. Hemoptysis, if any: She reports some cough. Pain issues, if any:  Denies chest pain, pressure or tightness.    SAFETY ISSUES: Prior radiation? No Pacemaker/ICD? No Possible current pregnancy? no Is the patient on methotrexate? No  Current Complaints / other details:

## 2023-12-26 ENCOUNTER — Ambulatory Visit
Admission: RE | Admit: 2023-12-26 | Discharge: 2023-12-26 | Disposition: A | Discharge status: Home / Self Care | Source: Ambulatory Visit | Attending: Radiation Oncology | Admitting: Radiation Oncology

## 2023-12-26 ENCOUNTER — Encounter: Payer: Self-pay | Admitting: Radiation Oncology

## 2023-12-26 ENCOUNTER — Encounter: Payer: Self-pay | Admitting: *Deleted

## 2023-12-26 VITALS — BP 161/75 | HR 91 | Temp 97.9°F | Resp 20 | Ht 59.0 in | Wt 152.6 lb

## 2023-12-26 DIAGNOSIS — C3412 Malignant neoplasm of upper lobe, left bronchus or lung: Secondary | ICD-10-CM

## 2023-12-28 ENCOUNTER — Telehealth: Payer: Self-pay | Admitting: Radiation Oncology

## 2023-12-28 NOTE — Telephone Encounter (Signed)
 Received call from pt stating she received a call from our office regarding her appt next week. Pt advised that initially she was unsure about XRT but has now decided she will proceed with tx plan. Pt confirmed 6/26 appt and advised she will be here. CT SIM notified via email.

## 2023-12-31 ENCOUNTER — Ambulatory Visit (INDEPENDENT_AMBULATORY_CARE_PROVIDER_SITE_OTHER): Admitting: Student

## 2023-12-31 ENCOUNTER — Encounter: Payer: Self-pay | Admitting: Student

## 2023-12-31 VITALS — BP 135/82 | HR 88 | Temp 97.4°F | Ht 59.0 in | Wt 153.6 lb

## 2023-12-31 DIAGNOSIS — L03116 Cellulitis of left lower limb: Secondary | ICD-10-CM

## 2023-12-31 NOTE — Patient Instructions (Addendum)
 Pleasure to meet you today.   The wound on your left foot looks to be healing very well.  Please make sure you complete your antibiotics.  And you can put the antibiotics cream/bacitracin over the wound area.

## 2023-12-31 NOTE — Progress Notes (Signed)
    SUBJECTIVE:   CHIEF COMPLAINT / HPI:   [name] [name] is an 83 year old female with lung cancer who presents today for left leg.  Patient was seen a week ago for infected left leg wound and was treated with doxycycline  100 mg twice daily signs of cellulitis.  She was using Neosporin but suspects it may cause skin bubbling. She was advised to stop using Vaseline due to potential skin issues.  Today patient states wound has significantly improved.  Swelling and redness are much better.  Denies any fevers, chills or wound drainage.  Her lung cancer diagnosis is recent, and radiation treatment was temporarily halted due to the foot infection, with plans to resume on Wednesday  PERTINENT  PMH / PSH: Reviewed  OBJECTIVE:   BP 135/82   Pulse 88   Temp (!) 97.4 F (36.3 C)   Ht 4' 11 (1.499 m)   Wt 153 lb 9.6 oz (69.7 kg)   SpO2 92%   BMI 31.02 kg/m    Physical Exam General: Alert, elderly lady, NAD Cardiovascular: RRR, No Murmurs, Normal S2/S2 Respiratory: CTAB, No wheezing or Rales Abdomen: No distension or tenderness LLE- multiple healing scabbed wound. No drainage or surrounding erythema  :  ASSESSMENT/PLAN:   LLE Cellulitis Well healing wound.  Erythema and swelling are significantly improved on antibiotics -Advised patient to complete antibiotic therapy - Encourage continued wound dressed and use of bacitracin followed by Neosporin - Return precautions discussed with patient   [name] April, MD [facility]

## 2024-01-01 ENCOUNTER — Other Ambulatory Visit: Payer: Self-pay | Admitting: Medical Oncology

## 2024-01-01 DIAGNOSIS — C3412 Malignant neoplasm of upper lobe, left bronchus or lung: Secondary | ICD-10-CM

## 2024-01-02 ENCOUNTER — Other Ambulatory Visit: Payer: Self-pay | Admitting: Internal Medicine

## 2024-01-02 ENCOUNTER — Inpatient Hospital Stay

## 2024-01-02 ENCOUNTER — Inpatient Hospital Stay: Attending: Internal Medicine | Admitting: Internal Medicine

## 2024-01-02 VITALS — BP 124/67 | HR 79 | Temp 99.9°F | Resp 16 | Ht 59.0 in | Wt 152.9 lb

## 2024-01-02 DIAGNOSIS — N183 Chronic kidney disease, stage 3 unspecified: Secondary | ICD-10-CM | POA: Insufficient documentation

## 2024-01-02 DIAGNOSIS — I129 Hypertensive chronic kidney disease with stage 1 through stage 4 chronic kidney disease, or unspecified chronic kidney disease: Secondary | ICD-10-CM | POA: Diagnosis not present

## 2024-01-02 DIAGNOSIS — C3412 Malignant neoplasm of upper lobe, left bronchus or lung: Secondary | ICD-10-CM | POA: Diagnosis present

## 2024-01-02 DIAGNOSIS — C3490 Malignant neoplasm of unspecified part of unspecified bronchus or lung: Secondary | ICD-10-CM

## 2024-01-02 DIAGNOSIS — M069 Rheumatoid arthritis, unspecified: Secondary | ICD-10-CM | POA: Insufficient documentation

## 2024-01-02 DIAGNOSIS — Z79899 Other long term (current) drug therapy: Secondary | ICD-10-CM | POA: Insufficient documentation

## 2024-01-02 DIAGNOSIS — K219 Gastro-esophageal reflux disease without esophagitis: Secondary | ICD-10-CM | POA: Diagnosis not present

## 2024-01-02 DIAGNOSIS — Z87891 Personal history of nicotine dependence: Secondary | ICD-10-CM | POA: Insufficient documentation

## 2024-01-02 DIAGNOSIS — F419 Anxiety disorder, unspecified: Secondary | ICD-10-CM | POA: Insufficient documentation

## 2024-01-02 LAB — CBC WITH DIFFERENTIAL (CANCER CENTER ONLY)
Abs Immature Granulocytes: 0.02 10*3/uL (ref 0.00–0.07)
Basophils Absolute: 0 10*3/uL (ref 0.0–0.1)
Basophils Relative: 0 %
Eosinophils Absolute: 0 10*3/uL (ref 0.0–0.5)
Eosinophils Relative: 1 %
HCT: 34.8 % — ABNORMAL LOW (ref 36.0–46.0)
Hemoglobin: 12.3 g/dL (ref 12.0–15.0)
Immature Granulocytes: 0 %
Lymphocytes Relative: 26 %
Lymphs Abs: 1.8 10*3/uL (ref 0.7–4.0)
MCH: 35 pg — ABNORMAL HIGH (ref 26.0–34.0)
MCHC: 35.3 g/dL (ref 30.0–36.0)
MCV: 99.1 fL (ref 80.0–100.0)
Monocytes Absolute: 0.4 10*3/uL (ref 0.1–1.0)
Monocytes Relative: 5 %
Neutro Abs: 4.7 10*3/uL (ref 1.7–7.7)
Neutrophils Relative %: 68 %
Platelet Count: 174 10*3/uL (ref 150–400)
RBC: 3.51 MIL/uL — ABNORMAL LOW (ref 3.87–5.11)
RDW: 13.1 % (ref 11.5–15.5)
WBC Count: 6.9 10*3/uL (ref 4.0–10.5)
nRBC: 0 % (ref 0.0–0.2)

## 2024-01-02 LAB — CMP (CANCER CENTER ONLY)
ALT: 7 U/L (ref 0–44)
AST: 14 U/L — ABNORMAL LOW (ref 15–41)
Albumin: 4.2 g/dL (ref 3.5–5.0)
Alkaline Phosphatase: 62 U/L (ref 38–126)
Anion gap: 9 (ref 5–15)
BUN: 18 mg/dL (ref 8–23)
CO2: 25 mmol/L (ref 22–32)
Calcium: 9.4 mg/dL (ref 8.9–10.3)
Chloride: 102 mmol/L (ref 98–111)
Creatinine: 1.19 mg/dL — ABNORMAL HIGH (ref 0.44–1.00)
GFR, Estimated: 45 mL/min — ABNORMAL LOW (ref >60)
Glucose, Bld: 82 mg/dL (ref 70–99)
Potassium: 4.1 mmol/L (ref 3.5–5.1)
Sodium: 136 mmol/L (ref 135–145)
Total Bilirubin: 0.5 mg/dL (ref 0.0–1.2)
Total Protein: 6.9 g/dL (ref 6.5–8.1)
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
## 2024-01-03 ENCOUNTER — Inpatient Hospital Stay

## 2024-01-03 ENCOUNTER — Ambulatory Visit
Admission: RE | Admit: 2024-01-03 | Discharge: 2024-01-03 | Disposition: A | Discharge status: Home / Self Care | Source: Ambulatory Visit | Attending: Radiation Oncology | Admitting: Radiation Oncology

## 2024-01-03 DIAGNOSIS — C3412 Malignant neoplasm of upper lobe, left bronchus or lung: Secondary | ICD-10-CM | POA: Insufficient documentation

## 2024-01-04 ENCOUNTER — Telehealth: Payer: Self-pay | Admitting: Internal Medicine

## 2024-01-04 NOTE — Telephone Encounter (Signed)
 Called the patient to schedule appointments. She requested I pick her appointments and mail them to her.

## 2024-01-09 ENCOUNTER — Ambulatory Visit
Admission: RE | Admit: 2024-01-09 | Discharge: 2024-01-09 | Disposition: A | Discharge status: Home / Self Care | Source: Ambulatory Visit | Attending: Radiation Oncology | Admitting: Radiation Oncology

## 2024-01-09 DIAGNOSIS — C3412 Malignant neoplasm of upper lobe, left bronchus or lung: Secondary | ICD-10-CM | POA: Insufficient documentation

## 2024-01-12 ENCOUNTER — Other Ambulatory Visit: Payer: Self-pay | Admitting: Family Medicine

## 2024-01-12 DIAGNOSIS — F419 Anxiety disorder, unspecified: Secondary | ICD-10-CM

## 2024-01-12 DIAGNOSIS — K219 Gastro-esophageal reflux disease without esophagitis: Secondary | ICD-10-CM

## 2024-01-12 DIAGNOSIS — F32A Depression, unspecified: Secondary | ICD-10-CM

## 2024-01-15 ENCOUNTER — Other Ambulatory Visit: Payer: Self-pay

## 2024-01-15 ENCOUNTER — Inpatient Hospital Stay: Attending: Internal Medicine

## 2024-01-15 DIAGNOSIS — C3412 Malignant neoplasm of upper lobe, left bronchus or lung: Secondary | ICD-10-CM

## 2024-01-15 LAB — RAD ONC ARIA SESSION SUMMARY
Course Elapsed Days: 0
Plan Fractions Treated to Date: 1
Plan Prescribed Dose Per Fraction: 18 Gy
Plan Total Fractions Prescribed: 3
Plan Total Prescribed Dose: 54 Gy
Reference Point Dosage Given to Date: 18 Gy
Reference Point Session Dosage Given: 18 Gy
Session Number: 1

## 2024-01-16 ENCOUNTER — Ambulatory Visit: Admitting: Radiation Oncology

## 2024-01-16 ENCOUNTER — Ambulatory Visit: Admitting: Student in an Organized Health Care Education/Training Program

## 2024-01-17 ENCOUNTER — Other Ambulatory Visit: Payer: Self-pay

## 2024-01-17 ENCOUNTER — Ambulatory Visit
Admission: RE | Admit: 2024-01-17 | Discharge: 2024-01-17 | Disposition: A | Discharge status: Home / Self Care | Source: Ambulatory Visit | Attending: Radiation Oncology | Admitting: Radiation Oncology

## 2024-01-17 DIAGNOSIS — C3412 Malignant neoplasm of upper lobe, left bronchus or lung: Secondary | ICD-10-CM | POA: Diagnosis not present

## 2024-01-17 LAB — RAD ONC ARIA SESSION SUMMARY
Course Elapsed Days: 2
Plan Fractions Treated to Date: 2
Plan Prescribed Dose Per Fraction: 18 Gy
Plan Total Fractions Prescribed: 3
Plan Total Prescribed Dose: 54 Gy
Reference Point Dosage Given to Date: 36 Gy
Reference Point Session Dosage Given: 18 Gy
Session Number: 2

## 2024-01-18 ENCOUNTER — Ambulatory Visit: Admitting: Radiation Oncology

## 2024-01-21 ENCOUNTER — Ambulatory Visit
Admission: RE | Admit: 2024-01-21 | Discharge: 2024-01-21 | Disposition: A | Discharge status: Home / Self Care | Source: Ambulatory Visit | Attending: Radiation Oncology | Admitting: Radiation Oncology

## 2024-01-21 ENCOUNTER — Other Ambulatory Visit: Payer: Self-pay

## 2024-01-21 DIAGNOSIS — C3412 Malignant neoplasm of upper lobe, left bronchus or lung: Secondary | ICD-10-CM

## 2024-01-21 LAB — RAD ONC ARIA SESSION SUMMARY
Course Elapsed Days: 6
Plan Fractions Treated to Date: 3
Plan Prescribed Dose Per Fraction: 18 Gy
Plan Total Fractions Prescribed: 3
Plan Total Prescribed Dose: 54 Gy
Reference Point Dosage Given to Date: 54 Gy
Reference Point Session Dosage Given: 18 Gy
Session Number: 3

## 2024-01-22 NOTE — Radiation Completion Notes (Addendum)
  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 She developed some fatigue, but did not experience any significant breathing issues or cough.   The patient will return in one month for follow-up.      Ronita Due, PA-C

## 2024-01-23 ENCOUNTER — Ambulatory Visit

## 2024-01-23 ENCOUNTER — Ambulatory Visit: Admitting: Radiation Oncology

## 2024-01-24 ENCOUNTER — Other Ambulatory Visit: Payer: Self-pay

## 2024-01-24 MED ORDER — GABAPENTIN 100 MG PO CAPS: 100.0000 mg | ORAL_CAPSULE | Freq: Three times a day (TID) | ORAL | 3 refills | Status: DC

## 2024-01-25 ENCOUNTER — Ambulatory Visit: Admitting: Radiation Oncology

## 2024-02-04 ENCOUNTER — Other Ambulatory Visit: Payer: Self-pay

## 2024-02-04 ENCOUNTER — Ambulatory Visit: Admitting: Student

## 2024-02-04 DIAGNOSIS — M5416 Radiculopathy, lumbar region: Secondary | ICD-10-CM

## 2024-02-04 DIAGNOSIS — M4722 Other spondylosis with radiculopathy, cervical region: Secondary | ICD-10-CM

## 2024-02-04 MED ORDER — GABAPENTIN 100 MG PO CAPS: 100.0000 mg | ORAL_CAPSULE | Freq: Three times a day (TID) | ORAL | 3 refills | Status: DC

## 2024-02-04 MED ORDER — TRAMADOL HCL 50 MG PO TABS: 50.0000 mg | ORAL_TABLET | Freq: Four times a day (QID) | ORAL | 3 refills | Status: DC | PRN

## 2024-02-04 NOTE — Telephone Encounter (Signed)
 Patient calls nurse line in regards to apt today.   She reports she does not have transportation and will have cancel this afternoons apt with PCP.   Patient rescheduled for next available apt, 8/18.  She reports she will need refills on Gabapentin  and Tramadol .   Advised will forward to PCP.

## 2024-02-08 ENCOUNTER — Ambulatory Visit: Payer: HMO | Admitting: "Endocrinology

## 2024-02-20 ENCOUNTER — Encounter: Payer: Self-pay | Admitting: Radiation Oncology

## 2024-02-20 NOTE — Progress Notes (Incomplete)
 Radiation Oncology         [phone] ________________________________  Name: [name] [name] MRN: [id number]  Date: 02/21/2024  DOB: Mar 12, 1941  Follow-Up Visit Note  CC: [name], DO  [name], DO  No diagnosis found.  Diagnosis: stage IA2 (T1b, N0, M0) NSCLC, adenocarcinoma of the LUL   Interval Since Last Radiation:  1 month 1 day  Intent: curative  First Treatment Date: 2024-01-15 Last Treatment Date: 2024-01-21   Plan Name: Lung_L_SBRT Site: Lung, Left Technique: SBRT/SRT-IMRT Mode: Photon Dose Per Fraction: 18 Gy Prescribed Dose (Delivered / Prescribed): 54 Gy / 54 Gy Prescribed Fxs (Delivered / Prescribed): 3 / 3  Narrative:  The patient returns today for routine follow-up. She was last seen in office on 12/26/23 for a consultation visit. Since then, patient completed her radiation treatment which she tolerated quite well. Patient did however endorse experiencing mild fatigue.                               In the interval since she was last seen, she presented for a consultation visit with Dr. Gatha on 01/02/24 during which further treatment plans were discussed. Dr. Gatha did not recommend surgery due to patient's age and comorbidities, therefore, they decided to proceed with curative SBRT and routine scans.   No other significant oncologic interval history since the patient was last seen.      Allergies:  is allergic to statins and statins.  Meds: Current Outpatient Medications  Medication Sig Dispense Refill   acetaminophen  (TYLENOL ) 325 MG tablet Take 325 mg by mouth every 6 (six) hours as needed for mild pain (pain score 1-3).     acetaminophen  (TYLENOL ) 500 MG tablet Take 500 mg by mouth every 8 (eight) hours as needed for moderate pain.     albuterol  (VENTOLIN  HFA) 108 (90 Base) MCG/ACT inhaler Inhale 1 puff into the lungs daily as needed for shortness of breath.     amLODipine  (NORVASC ) 2.5 MG tablet TAKE 1 TABLET BY MOUTH DAILY 90 tablet 1    busPIRone  (BUSPAR ) 10 MG tablet TAKE 1 TABLET BY MOUTH 3 TIMES A DAY 180 tablet 0   citalopram  (CELEXA ) 20 MG tablet Take 1 tablet (20 mg total) by mouth daily. 90 tablet 3   clopidogrel  (PLAVIX ) 75 MG tablet TAKE 1 TABLET BY MOUTH DAILY 90 tablet 3   divalproex  (DEPAKOTE ) 125 MG DR tablet TAKE 2 TABLETS BY MOUTH TWICE A DAY 120 tablet 2   furosemide  (LASIX ) 20 MG tablet Take 20 mg by mouth every other day.     gabapentin  (NEURONTIN ) 100 MG capsule Take 1 capsule (100 mg total) by mouth 3 (three) times daily. 90 capsule 3   Nystatin  (GERHARDT'S BUTT CREAM) CREA Apply 1 Application topically 2 (two) times daily. 60 each 3   nystatin  (MYCOSTATIN /NYSTOP ) powder Apply 1 Application topically 3 (three) times daily. Under the breast 120 g 3   nystatin -triamcinolone  ointment (MYCOLOG) Apply 1 Application topically 2 (two) times daily. Under the breasts 60 g 3   pantoprazole  (PROTONIX ) 40 MG tablet TAKE 1 TABLET BY MOUTH 2 TIMES A DAY 180 tablet 1   Propyl Glycol-Hydroxyethylcell (NASAL MOIST) GEL Apply after using nasal saline spray 28.5 g 2   sodium chloride  (OCEAN) 0.65 % SOLN nasal spray Use twice daily to keep nose moist. 104 mL 2   traMADol  (ULTRAM ) 50 MG tablet Take 1 tablet (50 mg total) by mouth every  6 (six) hours as needed. 60 tablet 3   No current facility-administered medications for this encounter.    Physical Findings: The patient is in no acute distress. Patient is alert and oriented.  vitals were not taken for this visit. .  No significant changes. Lungs are clear to auscultation bilaterally. Heart has regular rate and rhythm. No palpable cervical, supraclavicular, or axillary adenopathy. Abdomen soft, non-tender, normal bowel sounds.   Lab Findings: Lab Results  Component Value Date   WBC 6.9 01/02/2024   HGB 12.3 01/02/2024   HCT 34.8 (L) 01/02/2024   MCV 99.1 01/02/2024   PLT 174 01/02/2024    Radiographic Findings: No results found.  Impression:  stage IA2 (T1b, N0,  M0) NSCLC, adenocarcinoma of the LUL  The patient is recovering from the effects of radiation.  ***  Plan:  ***   *** minutes of total time was spent for this patient encounter, including preparation, face-to-face counseling with the patient and coordination of care, physical exam, and documentation of the encounter. ____________________________________  Lynwood CHARM Nasuti, PhD, MD  This document serves as a record of services personally performed by Lynwood Nasuti, MD. It was created on his behalf by Reymundo Cartwright, a trained medical scribe. The creation of this record is based on the scribe's personal observations and the provider's statements to them. This document has been checked and approved by the attending provider.

## 2024-02-21 ENCOUNTER — Ambulatory Visit
Admission: RE | Admit: 2024-02-21 | Discharge: 2024-02-21 | Disposition: A | Discharge status: Home / Self Care | Source: Ambulatory Visit | Attending: Radiation Oncology | Admitting: Radiation Oncology

## 2024-02-21 HISTORY — DX: Personal history of irradiation: Z92.3

## 2024-02-25 ENCOUNTER — Ambulatory Visit: Admitting: Student

## 2024-03-04 ENCOUNTER — Encounter (HOSPITAL_COMMUNITY): Payer: Self-pay

## 2024-03-04 ENCOUNTER — Other Ambulatory Visit: Payer: Self-pay

## 2024-03-04 ENCOUNTER — Inpatient Hospital Stay (HOSPITAL_COMMUNITY)
Admission: EM | Admit: 2024-03-04 | Discharge: 2024-03-11 | DRG: 392 | Disposition: A | Discharge status: Home Health | Attending: Internal Medicine | Admitting: Internal Medicine

## 2024-03-04 ENCOUNTER — Emergency Department (HOSPITAL_COMMUNITY)

## 2024-03-04 DIAGNOSIS — R531 Weakness: Secondary | ICD-10-CM

## 2024-03-04 DIAGNOSIS — E162 Hypoglycemia, unspecified: Secondary | ICD-10-CM | POA: Diagnosis not present

## 2024-03-04 DIAGNOSIS — Z66 Do not resuscitate: Secondary | ICD-10-CM | POA: Diagnosis present

## 2024-03-04 DIAGNOSIS — Z608 Other problems related to social environment: Secondary | ICD-10-CM | POA: Diagnosis present

## 2024-03-04 DIAGNOSIS — Z90711 Acquired absence of uterus with remaining cervical stump: Secondary | ICD-10-CM

## 2024-03-04 DIAGNOSIS — E66811 Obesity, class 1: Secondary | ICD-10-CM | POA: Diagnosis present

## 2024-03-04 DIAGNOSIS — C3412 Malignant neoplasm of upper lobe, left bronchus or lung: Secondary | ICD-10-CM | POA: Diagnosis present

## 2024-03-04 DIAGNOSIS — W1830XA Fall on same level, unspecified, initial encounter: Secondary | ICD-10-CM | POA: Diagnosis present

## 2024-03-04 DIAGNOSIS — Z79899 Other long term (current) drug therapy: Secondary | ICD-10-CM

## 2024-03-04 DIAGNOSIS — F411 Generalized anxiety disorder: Secondary | ICD-10-CM | POA: Diagnosis present

## 2024-03-04 DIAGNOSIS — Z808 Family history of malignant neoplasm of other organs or systems: Secondary | ICD-10-CM

## 2024-03-04 DIAGNOSIS — J439 Emphysema, unspecified: Secondary | ICD-10-CM | POA: Diagnosis present

## 2024-03-04 DIAGNOSIS — I10 Essential (primary) hypertension: Secondary | ICD-10-CM | POA: Diagnosis present

## 2024-03-04 DIAGNOSIS — E860 Dehydration: Secondary | ICD-10-CM | POA: Diagnosis present

## 2024-03-04 DIAGNOSIS — T50995A Adverse effect of other drugs, medicaments and biological substances, initial encounter: Secondary | ICD-10-CM | POA: Diagnosis present

## 2024-03-04 DIAGNOSIS — Y842 Radiological procedure and radiotherapy as the cause of abnormal reaction of the patient, or of later complication, without mention of misadventure at the time of the procedure: Secondary | ICD-10-CM | POA: Diagnosis present

## 2024-03-04 DIAGNOSIS — Z6831 Body mass index (BMI) 31.0-31.9, adult: Secondary | ICD-10-CM

## 2024-03-04 DIAGNOSIS — C3490 Malignant neoplasm of unspecified part of unspecified bronchus or lung: Secondary | ICD-10-CM | POA: Diagnosis present

## 2024-03-04 DIAGNOSIS — Y92009 Unspecified place in unspecified non-institutional (private) residence as the place of occurrence of the external cause: Secondary | ICD-10-CM

## 2024-03-04 DIAGNOSIS — R197 Diarrhea, unspecified: Secondary | ICD-10-CM | POA: Diagnosis present

## 2024-03-04 DIAGNOSIS — R112 Nausea with vomiting, unspecified: Principal | ICD-10-CM | POA: Diagnosis present

## 2024-03-04 DIAGNOSIS — I129 Hypertensive chronic kidney disease with stage 1 through stage 4 chronic kidney disease, or unspecified chronic kidney disease: Secondary | ICD-10-CM | POA: Diagnosis present

## 2024-03-04 DIAGNOSIS — Z87891 Personal history of nicotine dependence: Secondary | ICD-10-CM

## 2024-03-04 DIAGNOSIS — M25551 Pain in right hip: Secondary | ICD-10-CM | POA: Diagnosis present

## 2024-03-04 DIAGNOSIS — E7800 Pure hypercholesterolemia, unspecified: Secondary | ICD-10-CM | POA: Diagnosis present

## 2024-03-04 DIAGNOSIS — W19XXXA Unspecified fall, initial encounter: Secondary | ICD-10-CM

## 2024-03-04 DIAGNOSIS — E1151 Type 2 diabetes mellitus with diabetic peripheral angiopathy without gangrene: Secondary | ICD-10-CM | POA: Diagnosis present

## 2024-03-04 DIAGNOSIS — F32A Depression, unspecified: Secondary | ICD-10-CM | POA: Diagnosis present

## 2024-03-04 DIAGNOSIS — N183 Chronic kidney disease, stage 3 unspecified: Secondary | ICD-10-CM | POA: Diagnosis present

## 2024-03-04 DIAGNOSIS — G43909 Migraine, unspecified, not intractable, without status migrainosus: Secondary | ICD-10-CM | POA: Diagnosis present

## 2024-03-04 DIAGNOSIS — E11649 Type 2 diabetes mellitus with hypoglycemia without coma: Secondary | ICD-10-CM | POA: Diagnosis present

## 2024-03-04 DIAGNOSIS — R5381 Other malaise: Secondary | ICD-10-CM | POA: Diagnosis present

## 2024-03-04 DIAGNOSIS — E1122 Type 2 diabetes mellitus with diabetic chronic kidney disease: Secondary | ICD-10-CM | POA: Diagnosis present

## 2024-03-04 DIAGNOSIS — Z807 Family history of other malignant neoplasms of lymphoid, hematopoietic and related tissues: Secondary | ICD-10-CM

## 2024-03-04 DIAGNOSIS — Z8249 Family history of ischemic heart disease and other diseases of the circulatory system: Secondary | ICD-10-CM

## 2024-03-04 DIAGNOSIS — Z7902 Long term (current) use of antithrombotics/antiplatelets: Secondary | ICD-10-CM

## 2024-03-04 DIAGNOSIS — Z888 Allergy status to other drugs, medicaments and biological substances status: Secondary | ICD-10-CM

## 2024-03-04 DIAGNOSIS — Z923 Personal history of irradiation: Secondary | ICD-10-CM

## 2024-03-04 DIAGNOSIS — D72819 Decreased white blood cell count, unspecified: Secondary | ICD-10-CM | POA: Diagnosis present

## 2024-03-04 HISTORY — DX: Nausea with vomiting, unspecified: R11.2

## 2024-03-04 HISTORY — DX: Malignant (primary) neoplasm, unspecified: C80.1

## 2024-03-04 LAB — URINALYSIS, ROUTINE W REFLEX MICROSCOPIC
Bilirubin Urine: NEGATIVE
Glucose, UA: NEGATIVE mg/dL
Hgb urine dipstick: NEGATIVE
Ketones, ur: 5 mg/dL — AB
Leukocytes,Ua: NEGATIVE
Nitrite: NEGATIVE
Protein, ur: NEGATIVE mg/dL
Specific Gravity, Urine: 1.009 (ref 1.005–1.030)
pH: 5 (ref 5.0–8.0)

## 2024-03-04 LAB — CBC
HCT: 37.1 % (ref 36.0–46.0)
Hemoglobin: 12.7 g/dL (ref 12.0–15.0)
MCH: 35.6 pg — ABNORMAL HIGH (ref 26.0–34.0)
MCHC: 34.2 g/dL (ref 30.0–36.0)
MCV: 103.9 fL — ABNORMAL HIGH (ref 80.0–100.0)
Platelets: 103 K/uL — ABNORMAL LOW (ref 150–400)
RBC: 3.57 MIL/uL — ABNORMAL LOW (ref 3.87–5.11)
RDW: 13.9 % (ref 11.5–15.5)
WBC: 3.3 K/uL — ABNORMAL LOW (ref 4.0–10.5)
nRBC: 0 % (ref 0.0–0.2)

## 2024-03-04 LAB — COMPREHENSIVE METABOLIC PANEL WITH GFR
ALT: <5 U/L (ref 0–44)
AST: 14 U/L — ABNORMAL LOW (ref 15–41)
Albumin: 3.4 g/dL — ABNORMAL LOW (ref 3.5–5.0)
Alkaline Phosphatase: 40 U/L (ref 38–126)
Anion gap: 14 (ref 5–15)
BUN: 11 mg/dL (ref 8–23)
CO2: 16 mmol/L — ABNORMAL LOW (ref 22–32)
Calcium: 7.4 mg/dL — ABNORMAL LOW (ref 8.9–10.3)
Chloride: 111 mmol/L (ref 98–111)
Creatinine, Ser: 0.86 mg/dL (ref 0.44–1.00)
GFR, Estimated: >60 mL/min (ref >60)
Glucose, Bld: 41 mg/dL — CL (ref 70–99)
Potassium: 3.6 mmol/L (ref 3.5–5.1)
Sodium: 141 mmol/L (ref 135–145)
Total Bilirubin: 0.4 mg/dL (ref 0.0–1.2)
Total Protein: 4.9 g/dL — ABNORMAL LOW (ref 6.5–8.1)

## 2024-03-04 LAB — CBG MONITORING, ED
Glucose-Capillary: 40 mg/dL — CL (ref 70–99)
Glucose-Capillary: 208 mg/dL — ABNORMAL HIGH (ref 70–99)
Glucose-Capillary: 122 mg/dL — ABNORMAL HIGH (ref 70–99)

## 2024-03-04 LAB — LACTIC ACID, PLASMA: Lactic Acid, Venous: 0.9 mmol/L (ref 0.5–1.9)

## 2024-03-04 LAB — LIPASE, BLOOD: Lipase: 23 U/L (ref 11–51)

## 2024-03-04 MED ORDER — ONDANSETRON HCL 4 MG/2ML IJ SOLN
4.0000 mg | Freq: Once | INTRAMUSCULAR | Status: AC
  Administered 2024-03-04: 4 mg via INTRAVENOUS
  Filled 2024-03-04: qty 2

## 2024-03-04 MED ORDER — DEXTROSE 50 % IV SOLN
50.0000 mL | Freq: Once | INTRAVENOUS | Status: AC
  Administered 2024-03-04: 50 mL via INTRAVENOUS
  Filled 2024-03-04: qty 50

## 2024-03-04 MED ORDER — SODIUM CHLORIDE 0.9 % IV BOLUS
1000.0000 mL | Freq: Once | INTRAVENOUS | Status: AC
  Administered 2024-03-04: 1000 mL via INTRAVENOUS

## 2024-03-04 MED ORDER — IOHEXOL 300 MG/ML  SOLN
100.0000 mL | Freq: Once | INTRAMUSCULAR | Status: AC | PRN
  Administered 2024-03-04: 100 mL via INTRAVENOUS

## 2024-03-04 NOTE — ED Provider Notes (Signed)
 [place] [facility]PART[facility] AT [facility] Provider Note   CSN: [id number] Arrival date & time: 03/04/24  1736     Patient presents with: Nausea   [name] [name] is a 83 y.o. adult.   83 year old female presenting with multiple complaints.  Patient lung cancer and her last radiation treatment was approximately 2 weeks ago, she says since that time her left breast has been swollen and painful, she has also noticed a knot in her left armpit.  Saturday/Sunday she began to experience diarrhea and vomiting, she tells me I have not been able to keep anything down, she is not currently nauseated.  She endorses RLQ abdominal pain. She also dark/malodorous urine on Sunday and now describes some dysuria, she has also had chills for several days at home but does not have a thermometer to check her temperature. She also had a fall yesterday in her home, she ambulates using a rollator walker and fell when going over a raised area in a doorway, she notices some pain in her R hip and believes she struck her R forehead on the ground as well. She is on Plavix . She reports occasional shortness of breath, stating I get winded when I talk, but denies chest pain. She also tells me she has felt more confused lately. She was found to be hypoglycemic en route with EMS, was given 12.5 D10 and Zofran .        Prior to Admission medications   Medication Sig Start Date End Date Taking? Authorizing Provider  acetaminophen  (TYLENOL ) 325 MG tablet Take 325 mg by mouth every 6 (six) hours as needed for mild pain (pain score 1-3).    [provider]  acetaminophen  (TYLENOL ) 500 MG tablet Take 500 mg by mouth every 8 (eight) hours as needed for moderate pain.    [provider]  albuterol  (VENTOLIN  HFA) 108 (90 Base) MCG/ACT inhaler Inhale 1 puff into the lungs daily as needed for shortness of breath. 09/01/22   [provider]  amLODipine  (NORVASC ) 2.5 MG tablet TAKE 1 TABLET BY  MOUTH DAILY 10/10/23   [name], DO  busPIRone  (BUSPAR ) 10 MG tablet TAKE 1 TABLET BY MOUTH 3 TIMES A DAY 08/27/23   [name], DO  citalopram  (CELEXA ) 20 MG tablet Take 1 tablet (20 mg total) by mouth daily. 11/06/23   [name], DO  clopidogrel  (PLAVIX ) 75 MG tablet TAKE 1 TABLET BY MOUTH DAILY 06/20/23   Rosendo Rush, MD  divalproex  (DEPAKOTE ) 125 MG DR tablet TAKE 2 TABLETS BY MOUTH TWICE A DAY 01/14/24   [name], DO  furosemide  (LASIX ) 20 MG tablet Take 20 mg by mouth every other day. 07/17/22   [provider]  gabapentin  (NEURONTIN ) 100 MG capsule Take 1 capsule (100 mg total) by mouth 3 (three) times daily. 02/04/24   [name], DO  Nystatin  (GERHARDT'S BUTT CREAM) CREA Apply 1 Application topically 2 (two) times daily. 08/08/23   [name], DO  nystatin  (MYCOSTATIN /NYSTOP ) powder Apply 1 Application topically 3 (three) times daily. Under the breast 11/06/23   [name], DO  nystatin -triamcinolone  ointment (MYCOLOG) Apply 1 Application topically 2 (two) times daily. Under the breasts 11/06/23   [name], DO  pantoprazole  (PROTONIX ) 40 MG tablet TAKE 1 TABLET BY MOUTH 2 TIMES A DAY 01/14/24   [name], DO  Propyl Glycol-Hydroxyethylcell (NASAL MOIST) GEL Apply after using nasal saline spray 10/30/22   Macario Dorothyann HERO, MD  sodium chloride  (OCEAN) 0.65 % SOLN nasal spray  Use twice daily to keep nose moist. 10/30/22   Macario Dorothyann HERO, MD  traMADol  (ULTRAM ) 50 MG tablet Take 1 tablet (50 mg total) by mouth every 6 (six) hours as needed. 02/04/24   [name], DO    Allergies: Statins and Statins    Review of Systems  Constitutional:  Positive for chills.  Genitourinary:  Positive for dysuria.    Updated Vital Signs  Vitals:   03/04/24 1740 03/04/24 1746 03/04/24 1815 03/04/24 2100  BP:  (!) 161/77 139/65 132/74  Pulse:  80 78 70  Resp:  20 14 17   Temp:  97.9 F (36.6 C)  98.4 F (36.9 C)  TempSrc:  Oral    SpO2:  96% 94% 96%  Weight: 70  kg     Height: 4' 11 (1.499 m)        Physical Exam Vitals and nursing note reviewed.  HENT:     Head: Normocephalic.  Eyes:     Extraocular Movements: Extraocular movements intact.  Cardiovascular:     Rate and Rhythm: Normal rate and regular rhythm.  Pulmonary:     Effort: Pulmonary effort is normal.     Breath sounds: Normal breath sounds.     Comments: Speaking in full sentences, no tripoding, no accessory muscle use Abdominal:     Palpations: Abdomen is soft.     Tenderness: There is abdominal tenderness (RLQ). There is no guarding.  Musculoskeletal:     Cervical back: Normal range of motion.     Comments: Moves all extremities spontaneously without difficulty Brawny edema with chronic skin changes noted to bilateral lower extremities, non-pitting TTP of R hip, no obvious bruising or deformity  Skin:    General: Skin is warm and dry.     Comments: One palpable L axillary lymph node with associated TTP, inferior portion of L breast is tender to palpation but there is no obvious erythema/warmth/swelling   Neurological:     Mental Status: She is alert and oriented to person, place, and time.     (all labs ordered are listed, but only abnormal results are displayed) Labs Reviewed  CBC - Abnormal; Notable for the following components:      Result Value   WBC 3.3 (*)    RBC 3.57 (*)    MCV 103.9 (*)    MCH 35.6 (*)    Platelets 103 (*)    All other components within normal limits  URINALYSIS, ROUTINE W REFLEX MICROSCOPIC - Abnormal; Notable for the following components:   Color, Urine STRAW (*)    Ketones, ur 5 (*)    All other components within normal limits  COMPREHENSIVE METABOLIC PANEL WITH GFR - Abnormal; Notable for the following components:   CO2 16 (*)    Glucose, Bld 41 (*)    Calcium 7.4 (*)    Total Protein 4.9 (*)    Albumin 3.4 (*)    AST 14 (*)    All other components within normal limits  CBG MONITORING, ED - Abnormal; Notable for the following  components:   Glucose-Capillary 40 (*)    All other components within normal limits  CBG MONITORING, ED - Abnormal; Notable for the following components:   Glucose-Capillary 208 (*)    All other components within normal limits  CULTURE, BLOOD (ROUTINE X 2)  CULTURE, BLOOD (ROUTINE X 2)  LACTIC ACID, PLASMA  LIPASE, BLOOD  CBG MONITORING, ED    EKG: None  Radiology: DG Hip Unilat W or Wo  Pelvis 2-3 Views Right Result Date: 03/04/2024 CLINICAL DATA:  Right hip pain status post fall. EXAM: DG HIP (WITH OR WITHOUT PELVIS) 2-3V RIGHT COMPARISON:  None Available. FINDINGS: There is no evidence of an acute hip fracture or dislocation. Marked severity degenerative changes are seen in the form of joint space narrowing and acetabular sclerosis. Degenerative changes are also seen involving the visualized portion of the lower lumbar spine. Bilateral radiopaque vascular stents are seen overlying the pelvis. IMPRESSION: Marked severity degenerative changes of the right hip. Electronically Signed   By: Suzen Dials M.D.   On: 03/04/2024 19:53     Procedures   Medications Ordered in the ED  iohexol  (OMNIPAQUE ) 300 MG/ML solution 100 mL (has no administration in time range)  sodium chloride  0.9 % bolus 1,000 mL (0 mLs Intravenous Stopped 03/04/24 1942)  ondansetron  (ZOFRAN ) injection 4 mg (4 mg Intravenous Given 03/04/24 2120)  dextrose  50 % solution 50 mL (50 mLs Intravenous Given 03/04/24 2120)                                    Medical Decision Making This patient presents to the ED for concern of nausea/vomiting, this involves an extensive number of treatment options, and is a complaint that carries with it a high risk of complications and morbidity.  The differential diagnosis includes gastroenteritis, complications of cancer treatment, intraabdominal infection, UTI, hypoglycemia, electrolyte disturbance   Co morbidities that complicate the patient evaluation  Lung cancer with recent  radiation treatment 2 weeks ago   Additional history obtained:  Additional history obtained from record review External records from outside source obtained and reviewed including recent oncology treatment notes    Lab Tests:  I Ordered, and personally interpreted labs.  The pertinent results include:  CBC notable for leukopenia with WBC of 3.3, thrombocytopenia with platelet count of 103.  UA notable for 5 ketones, negative leukocytes/nitrites, no obvious infection. Lactic within normal limits. CMP notable for hypoglycemia, hypocalcemia, decreased protein/albumin. Repeat CBG 208 after receiving Dextrose .    Imaging Studies ordered:  I ordered imaging studies including CT head, CT chest/abdomen/pelvis, hip XR I independently visualized and interpreted imaging which showed  - hip XR: Marked severity degenerative changes of the right hip. - CT chest/abdomen/pelvis: pending at time of shift change I agree with the radiologist interpretation   Cardiac Monitoring: / EKG:  The patient was maintained on a cardiac monitor.  I personally viewed and interpreted the cardiac monitored which showed an underlying rhythm of: NSR   Problem List / ED Course / Critical interventions / Medication management  I ordered medication including dextrose  for hypoglycemia, zofran  for nausea  Reevaluation of the patient after these medicines showed that the patient improved I have reviewed the patients home medicines and have made adjustments as needed   Social Determinants of Health:  Former tobacco use, depression, unmet transportation needs   Test / Admission - Considered:  Physical exam notable as above. Patient's hypoglycemia was initially corrected by EMS, however patient subsequently developed hypoglycemia again while in the Emergency Department, this was corrected with Dextrose  and normalized to 208. She was given juice but immediately began to vomit, I was at the bedside and able to assist  patient with her sitting upright in the bed to vomit, no evidence of aspiration. Zofran  given. She is A&O x 4. I am concerned that there is an underlying infectious etiology contributing to  her symptoms today, although she has remained afebrile, no evidence of leukocytosis, and lactic reassuring, but she is high risk given diagnosis of lung cancer and recent radiation. CT chest/abdomen/pelvis pending at time of shift change  Handed off to oncoming PA-C Ubaldo High at shift change pending reassessment of hypoglycemia/nausea/vomiting and  CT chest/abdomen/pelvis  Staffed with Dr. Patsey    Amount and/or Complexity of Data Reviewed Labs: ordered. Radiology: ordered.  Risk Prescription drug management.        Final diagnoses:  Intractable nausea and vomiting  Hypoglycemia    ED Discharge Orders     None          [name] [name] 03/04/24 2222    Patsey Lot, MD 03/04/24 2337

## 2024-03-04 NOTE — ED Triage Notes (Addendum)
 Patient BIB [facility] from home. Finished radiation for breast cancer 2 weeks ago. Has been having nausea and dry heaving for 2 days. Has left sided breast pain/swelling to where she had the radiation. Feels weak and tired along with fever and chills. Fell yesterday and began having right sided hip pain.  EMS CBG 55 they administered 12.5 D10 and 4mg  zofran  IV. CBG came up to 208.

## 2024-03-05 ENCOUNTER — Encounter (HOSPITAL_COMMUNITY): Payer: Self-pay | Admitting: Internal Medicine

## 2024-03-05 DIAGNOSIS — E66811 Obesity, class 1: Secondary | ICD-10-CM | POA: Diagnosis present

## 2024-03-05 DIAGNOSIS — Z87891 Personal history of nicotine dependence: Secondary | ICD-10-CM | POA: Diagnosis not present

## 2024-03-05 DIAGNOSIS — Y842 Radiological procedure and radiotherapy as the cause of abnormal reaction of the patient, or of later complication, without mention of misadventure at the time of the procedure: Secondary | ICD-10-CM | POA: Diagnosis present

## 2024-03-05 DIAGNOSIS — J439 Emphysema, unspecified: Secondary | ICD-10-CM | POA: Diagnosis present

## 2024-03-05 DIAGNOSIS — I10 Essential (primary) hypertension: Secondary | ICD-10-CM | POA: Diagnosis not present

## 2024-03-05 DIAGNOSIS — E11649 Type 2 diabetes mellitus with hypoglycemia without coma: Secondary | ICD-10-CM | POA: Diagnosis present

## 2024-03-05 DIAGNOSIS — Y92009 Unspecified place in unspecified non-institutional (private) residence as the place of occurrence of the external cause: Secondary | ICD-10-CM

## 2024-03-05 DIAGNOSIS — T50995A Adverse effect of other drugs, medicaments and biological substances, initial encounter: Secondary | ICD-10-CM | POA: Diagnosis present

## 2024-03-05 DIAGNOSIS — E7800 Pure hypercholesterolemia, unspecified: Secondary | ICD-10-CM | POA: Diagnosis present

## 2024-03-05 DIAGNOSIS — E1122 Type 2 diabetes mellitus with diabetic chronic kidney disease: Secondary | ICD-10-CM | POA: Diagnosis present

## 2024-03-05 DIAGNOSIS — R531 Weakness: Secondary | ICD-10-CM

## 2024-03-05 DIAGNOSIS — Z923 Personal history of irradiation: Secondary | ICD-10-CM | POA: Diagnosis not present

## 2024-03-05 DIAGNOSIS — F411 Generalized anxiety disorder: Secondary | ICD-10-CM | POA: Diagnosis present

## 2024-03-05 DIAGNOSIS — C3412 Malignant neoplasm of upper lobe, left bronchus or lung: Secondary | ICD-10-CM

## 2024-03-05 DIAGNOSIS — E162 Hypoglycemia, unspecified: Secondary | ICD-10-CM | POA: Diagnosis present

## 2024-03-05 DIAGNOSIS — D72819 Decreased white blood cell count, unspecified: Secondary | ICD-10-CM | POA: Diagnosis present

## 2024-03-05 DIAGNOSIS — Z79899 Other long term (current) drug therapy: Secondary | ICD-10-CM | POA: Diagnosis not present

## 2024-03-05 DIAGNOSIS — W19XXXA Unspecified fall, initial encounter: Secondary | ICD-10-CM

## 2024-03-05 DIAGNOSIS — G43909 Migraine, unspecified, not intractable, without status migrainosus: Secondary | ICD-10-CM | POA: Diagnosis present

## 2024-03-05 DIAGNOSIS — Z7902 Long term (current) use of antithrombotics/antiplatelets: Secondary | ICD-10-CM | POA: Diagnosis not present

## 2024-03-05 DIAGNOSIS — Z8249 Family history of ischemic heart disease and other diseases of the circulatory system: Secondary | ICD-10-CM | POA: Diagnosis not present

## 2024-03-05 DIAGNOSIS — R112 Nausea with vomiting, unspecified: Secondary | ICD-10-CM

## 2024-03-05 DIAGNOSIS — E1151 Type 2 diabetes mellitus with diabetic peripheral angiopathy without gangrene: Secondary | ICD-10-CM | POA: Diagnosis present

## 2024-03-05 DIAGNOSIS — Z66 Do not resuscitate: Secondary | ICD-10-CM | POA: Diagnosis present

## 2024-03-05 DIAGNOSIS — F32A Depression, unspecified: Secondary | ICD-10-CM | POA: Diagnosis present

## 2024-03-05 DIAGNOSIS — C3490 Malignant neoplasm of unspecified part of unspecified bronchus or lung: Secondary | ICD-10-CM | POA: Diagnosis present

## 2024-03-05 DIAGNOSIS — N183 Chronic kidney disease, stage 3 unspecified: Secondary | ICD-10-CM | POA: Diagnosis present

## 2024-03-05 DIAGNOSIS — I129 Hypertensive chronic kidney disease with stage 1 through stage 4 chronic kidney disease, or unspecified chronic kidney disease: Secondary | ICD-10-CM | POA: Diagnosis present

## 2024-03-05 DIAGNOSIS — E860 Dehydration: Secondary | ICD-10-CM | POA: Diagnosis present

## 2024-03-05 DIAGNOSIS — W1830XA Fall on same level, unspecified, initial encounter: Secondary | ICD-10-CM | POA: Diagnosis present

## 2024-03-05 LAB — CBG MONITORING, ED
Glucose-Capillary: 75 mg/dL (ref 70–99)
Glucose-Capillary: 84 mg/dL (ref 70–99)

## 2024-03-05 LAB — GLUCOSE, CAPILLARY
Glucose-Capillary: 115 mg/dL — ABNORMAL HIGH (ref 70–99)
Glucose-Capillary: 116 mg/dL — ABNORMAL HIGH (ref 70–99)
Glucose-Capillary: 127 mg/dL — ABNORMAL HIGH (ref 70–99)
Glucose-Capillary: 162 mg/dL — ABNORMAL HIGH (ref 70–99)
Glucose-Capillary: 172 mg/dL — ABNORMAL HIGH (ref 70–99)
Glucose-Capillary: 229 mg/dL — ABNORMAL HIGH (ref 70–99)
Glucose-Capillary: 68 mg/dL — ABNORMAL LOW (ref 70–99)
Glucose-Capillary: 84 mg/dL (ref 70–99)
Glucose-Capillary: 94 mg/dL (ref 70–99)

## 2024-03-05 LAB — CBC WITH DIFFERENTIAL/PLATELET
Abs Immature Granulocytes: 0.02 K/uL (ref 0.00–0.07)
Basophils Absolute: 0 K/uL (ref 0.0–0.1)
Basophils Relative: 0 %
Eosinophils Absolute: 0.1 K/uL (ref 0.0–0.5)
Eosinophils Relative: 3 %
HCT: 35.2 % — ABNORMAL LOW (ref 36.0–46.0)
Hemoglobin: 12.2 g/dL (ref 12.0–15.0)
Immature Granulocytes: 1 %
Lymphocytes Relative: 29 %
Lymphs Abs: 0.7 K/uL (ref 0.7–4.0)
MCH: 35.7 pg — ABNORMAL HIGH (ref 26.0–34.0)
MCHC: 34.7 g/dL (ref 30.0–36.0)
MCV: 102.9 fL — ABNORMAL HIGH (ref 80.0–100.0)
Monocytes Absolute: 0.3 K/uL (ref 0.1–1.0)
Monocytes Relative: 13 %
Neutro Abs: 1.4 K/uL — ABNORMAL LOW (ref 1.7–7.7)
Neutrophils Relative %: 54 %
Platelets: 107 K/uL — ABNORMAL LOW (ref 150–400)
RBC: 3.42 MIL/uL — ABNORMAL LOW (ref 3.87–5.11)
RDW: 13.9 % (ref 11.5–15.5)
WBC: 2.5 K/uL — ABNORMAL LOW (ref 4.0–10.5)
nRBC: 0 % (ref 0.0–0.2)

## 2024-03-05 LAB — COMPREHENSIVE METABOLIC PANEL WITH GFR
ALT: <5 U/L (ref 0–44)
AST: 17 U/L (ref 15–41)
Albumin: 4 g/dL (ref 3.5–5.0)
Alkaline Phosphatase: 51 U/L (ref 38–126)
Anion gap: 12 (ref 5–15)
BUN: 9 mg/dL (ref 8–23)
CO2: 20 mmol/L — ABNORMAL LOW (ref 22–32)
Calcium: 9.2 mg/dL (ref 8.9–10.3)
Chloride: 106 mmol/L (ref 98–111)
Creatinine, Ser: 1.03 mg/dL — ABNORMAL HIGH (ref 0.44–1.00)
GFR, Estimated: 53 mL/min — ABNORMAL LOW (ref >60)
Glucose, Bld: 173 mg/dL — ABNORMAL HIGH (ref 70–99)
Potassium: 3.4 mmol/L — ABNORMAL LOW (ref 3.5–5.1)
Sodium: 138 mmol/L (ref 135–145)
Total Bilirubin: 0.6 mg/dL (ref 0.0–1.2)
Total Protein: 5.9 g/dL — ABNORMAL LOW (ref 6.5–8.1)

## 2024-03-05 LAB — URINE DRUG SCREEN
Amphetamines: NEGATIVE
Barbiturates: NEGATIVE
Benzodiazepines: NEGATIVE
Cocaine: NEGATIVE
Fentanyl: NEGATIVE
Methadone Scn, Ur: NEGATIVE
Opiates: NEGATIVE
Tetrahydrocannabinol: NEGATIVE

## 2024-03-05 LAB — MAGNESIUM
Magnesium: 1.6 mg/dL — ABNORMAL LOW (ref 1.7–2.4)
Magnesium: 2.2 mg/dL (ref 1.7–2.4)

## 2024-03-05 LAB — TECHNOLOGIST SMEAR REVIEW

## 2024-03-05 LAB — TSH: TSH: 2.91 u[IU]/mL (ref 0.350–4.500)

## 2024-03-05 LAB — VITAMIN B12: Vitamin B-12: 830 pg/mL (ref 180–914)

## 2024-03-05 LAB — CK: Total CK: 36 U/L — ABNORMAL LOW (ref 38–234)

## 2024-03-05 LAB — MRSA NEXT GEN BY PCR, NASAL: MRSA by PCR Next Gen: NOT DETECTED

## 2024-03-05 LAB — VALPROIC ACID LEVEL: Valproic Acid Lvl: 19 ug/mL — ABNORMAL LOW (ref 50–100)

## 2024-03-05 MED ORDER — ACETAMINOPHEN 325 MG PO TABS
650.0000 mg | ORAL_TABLET | Freq: Four times a day (QID) | ORAL | Status: DC | PRN
  Administered 2024-03-05 – 2024-03-11 (×14): 650 mg via ORAL
  Filled 2024-03-05 (×14): qty 2

## 2024-03-05 MED ORDER — DEXTROSE 50 % IV SOLN
1.0000 | INTRAVENOUS | Status: DC | PRN
  Administered 2024-03-05: 50 mL via INTRAVENOUS
  Filled 2024-03-05: qty 50

## 2024-03-05 MED ORDER — DEXTROSE IN LACTATED RINGERS 5 % IV SOLN: INTRAVENOUS | Status: DC

## 2024-03-05 MED ORDER — MELATONIN 3 MG PO TABS
3.0000 mg | ORAL_TABLET | Freq: Every evening | ORAL | Status: DC | PRN
  Administered 2024-03-05 – 2024-03-10 (×4): 3 mg via ORAL
  Filled 2024-03-05 (×4): qty 1

## 2024-03-05 MED ORDER — IBUPROFEN 200 MG PO TABS
400.0000 mg | ORAL_TABLET | Freq: Once | ORAL | Status: AC
  Administered 2024-03-05: 400 mg via ORAL
  Filled 2024-03-05: qty 2

## 2024-03-05 MED ORDER — DIVALPROEX SODIUM 250 MG PO DR TAB
250.0000 mg | DELAYED_RELEASE_TABLET | Freq: Two times a day (BID) | ORAL | Status: DC
  Administered 2024-03-05 – 2024-03-11 (×13): 250 mg via ORAL
  Filled 2024-03-05 (×13): qty 1

## 2024-03-05 MED ORDER — CITALOPRAM HYDROBROMIDE 20 MG PO TABS
20.0000 mg | ORAL_TABLET | Freq: Every day | ORAL | Status: DC
  Administered 2024-03-05 – 2024-03-11 (×7): 20 mg via ORAL
  Filled 2024-03-05 (×7): qty 1

## 2024-03-05 MED ORDER — MAGNESIUM SULFATE 2 GM/50ML IV SOLN
2.0000 g | Freq: Once | INTRAVENOUS | Status: AC
  Administered 2024-03-05: 2 g via INTRAVENOUS
  Filled 2024-03-05: qty 50

## 2024-03-05 MED ORDER — AMLODIPINE BESYLATE 5 MG PO TABS
2.5000 mg | ORAL_TABLET | Freq: Every day | ORAL | Status: DC
  Administered 2024-03-05 – 2024-03-11 (×7): 2.5 mg via ORAL
  Filled 2024-03-05 (×7): qty 1

## 2024-03-05 MED ORDER — ACETAMINOPHEN 650 MG RE SUPP: 650.0000 mg | Freq: Four times a day (QID) | RECTAL | Status: DC | PRN

## 2024-03-05 MED ORDER — BUSPIRONE HCL 10 MG PO TABS
10.0000 mg | ORAL_TABLET | Freq: Three times a day (TID) | ORAL | Status: DC
  Administered 2024-03-05 – 2024-03-11 (×20): 10 mg via ORAL
  Filled 2024-03-05 (×4): qty 1
  Filled 2024-03-05: qty 2
  Filled 2024-03-05: qty 1
  Filled 2024-03-05: qty 2
  Filled 2024-03-05: qty 1
  Filled 2024-03-05: qty 2
  Filled 2024-03-05 (×11): qty 1

## 2024-03-05 MED ORDER — FENTANYL CITRATE PF 50 MCG/ML IJ SOSY
25.0000 ug | PREFILLED_SYRINGE | INTRAMUSCULAR | Status: DC | PRN
  Administered 2024-03-06 – 2024-03-09 (×6): 25 ug via INTRAVENOUS
  Filled 2024-03-05 (×6): qty 1

## 2024-03-05 MED ORDER — POTASSIUM CHLORIDE CRYS ER 20 MEQ PO TBCR
40.0000 meq | EXTENDED_RELEASE_TABLET | Freq: Once | ORAL | Status: AC
  Administered 2024-03-05: 40 meq via ORAL
  Filled 2024-03-05: qty 2

## 2024-03-05 MED ORDER — ONDANSETRON HCL 4 MG/2ML IJ SOLN
4.0000 mg | Freq: Four times a day (QID) | INTRAMUSCULAR | Status: DC | PRN
  Administered 2024-03-08: 4 mg via INTRAVENOUS
  Filled 2024-03-05 (×2): qty 2

## 2024-03-05 MED ORDER — PROCHLORPERAZINE EDISYLATE 10 MG/2ML IJ SOLN
10.0000 mg | Freq: Four times a day (QID) | INTRAMUSCULAR | Status: DC | PRN
  Administered 2024-03-05 – 2024-03-09 (×3): 10 mg via INTRAVENOUS
  Filled 2024-03-05 (×3): qty 2

## 2024-03-05 MED ORDER — NALOXONE HCL 0.4 MG/ML IJ SOLN: 0.4000 mg | INTRAMUSCULAR | Status: DC | PRN

## 2024-03-05 NOTE — Progress Notes (Signed)
 OT evaluation:  Clinical Impression: Pt typically needs assist with IADL and has been struggling with bathing. She gets around with Rollator/RW but fell in the one area of her mobile home where she cannot fit DME. She gets meals from meals on wheels and her neighbor Izetta comes over daily to assist with anything she might need from the goodness of her heart. Today Pt is willing to work with therapy. She is CGA for transfers with RW, min A for LB ADL due to pain in RLE. Educated to dress RLE first. Able to perform ambulation in room for toilet transfer and sink level grooming as well as peri care at CGA level. Pt shared that bathing has become extremely difficult and she avoids it because of fatigue levels and how unsafe it is (she fell and had to have the fire dept come get her up) she would like to be able to bathe safely and more frequently. At this time recommending continue OT in the acute setting as well as afterwards at the [facility] level to maximize safety and independence in ADL and functional transfers. She might also benefit from an aide for IADL assist.     03/05/24 0800  OT Visit Information  Last OT Received On 03/05/24  Assistance Needed +1  History of Present Illness [name] [name] is a 83 y.o. female admitted 03/04/24 due to multiple day of recurrent nausea resulting in at least 3-4 daily episodes of nonbloody, nonbilious emesis over that timeframe, leading to generalized weakness, which resulted in mechanical fall 03/04/24 X ray negative for acute fx or dislocation. CT of head negative for acute abnormality. Dx with hypoglycemia. PMH includes stage 1a adenocarcinoma of the left lung, undergoing radiation therapy, hypertension, GAD, depression.  Precautions  Precautions Fall  Recall of Precautions/Restrictions Intact  Restrictions  Weight Bearing Restrictions Per Provider Order No  Home Living  Family/patient expects to be discharged to: Private residence  Living Arrangements Alone   Available Help at Discharge Neighbor  Type of Home Mobile home  Home Access Ramped entrance  Home Layout One level  Bathroom Shower/Tub Tub/shower unit (garden tub)  Firefighter Handicapped height  Bathroom Accessibility Yes  How Accessible Accessible via Biomedical scientist (4 wheels);BSC/3in1;Shower seat;Wheelchair - manual;Tub bench;Hand held shower head  Additional Comments meals on wheels, decreased family support since daughters death  Prior Function  Prior Level of Function  Needs assist  Mobility Comments rollator and RW interchangably  ADLs Comments showering is hard (admits to sponge bathing at kitchen sink, longer stretches inbetween than she would like) meals on wheels providing food, neighbor helps with cleaning. dresses herself, toilets herself  Pain Assessment  Pain Assessment Faces  Faces Pain Scale 6  Pain Location R hip with mobility and esp bed mobility  Pain Descriptors / Indicators Discomfort;Grimacing;Moaning;Sore  Pain Intervention(s) Monitored during session;Repositioned  Cognition  Arousal Alert  Behavior During Therapy WFL for tasks assessed/performed  Cognition No apparent impairments  OT - Cognition Comments Pt within sound mind, decreased safety awareness in general  Following Commands  Following commands Intact  Cueing  Cueing Techniques Verbal cues;Visual cues  Communication  Communication No apparent difficulties  Upper Extremity Assessment  Upper Extremity Assessment Overall WFL for tasks assessed  Lower Extremity Assessment  Lower Extremity Assessment Defer to PT evaluation;RLE deficits/detail  RLE Deficits / Details painful due to fall  Cervical / Trunk Assessment  Cervical / Trunk Assessment Kyphotic  Vision- History  Baseline Vision/History 1 Wears glasses  Ability to See in Adequate Light 0 Adequate  Patient Visual Report No change from baseline  Vision- Assessment  Vision Assessment? No apparent visual deficits  ADL   Overall ADL's  Needs assistance/impaired  Eating/Feeding Modified independent  Grooming Wash/dry hands;Contact guard assist;Standing  Grooming Details (indicate cue type and reason) standing at sink, cues for safety with RW  Upper Body Bathing Set up  Lower Body Bathing Minimal assistance  Lower Body Bathing Details (indicate cue type and reason) RLE, typically able to pull up to figure 4 - not currently  Upper Body Dressing  Set up  Lower Body Dressing Contact guard assist;Sit to/from stand  Toilet Transfer Contact guard assist;Ambulation;Rolling walker (2 wheels)  Toilet Transfer Details (indicate cue type and reason) fatigued halfway back from toilet upon return  Toileting- Clothing Manipulation and Hygiene Supervision/safety;Sitting/lateral lean  Functional mobility during ADLs Contact guard assist;Rolling walker (2 wheels)  General ADL Comments expressed concern about tub transfers in home setting - has DME but big graden tub  Bed Mobility  Overal bed mobility Needs Assistance  Bed Mobility Sit to Supine  Sit to supine Min assist  General bed mobility comments with RLE  Transfers  Overall transfer level Needs assistance  Equipment used Rolling walker (2 wheels)  Transfers Sit to/from Stand  Sit to Stand Contact guard assist  General transfer comment good hand placement  Balance  Overall balance assessment Needs assistance  Sitting-balance support Feet supported  Sitting balance-Leahy Scale Good  Standing balance support Bilateral upper extremity supported;No upper extremity supported;During functional activity  Standing balance-Leahy Scale Fair  Standing balance comment requires BUE support during dynamic movement, no UE for static functional activities  General Comments  General comments (skin integrity, edema, etc.) neighbor Izetta present and very supportive. expressing concerns about meals on wheels - Pt does not like chicken and thats what they bring so meals pile up.  Also she works at [employer] and so will be getting busier - she is unpaid and just a good neighbor happy to help. but also concerned about Pt safety and managing  OT - End of Session  Equipment Utilized During Treatment Gait belt;Rolling walker (2 wheels)  Activity Tolerance Patient tolerated treatment well  Patient left in bed;with call bell/phone within reach;with bed alarm set;with family/visitor present  Nurse Communication Mobility status  OT Assessment  OT Recommendation/Assessment Patient needs continued OT Services  OT Visit Diagnosis Unsteadiness on feet (R26.81);Other abnormalities of gait and mobility (R26.89);Muscle weakness (generalized) (M62.81);History of falling (Z91.81);Pain  Pain - Right/Left Right  Pain - part of body Hip  OT Problem List Decreased strength;Decreased range of motion;Decreased activity tolerance;Impaired balance (sitting and/or standing);Pain  OT Plan  OT Frequency (ACUTE ONLY) Min 2X/week  OT Treatment/Interventions (ACUTE ONLY) Self-care/ADL training;Energy conservation;DME and/or AE instruction;Therapeutic activities;Patient/family education;Balance training  AM-PAC OT 6 Clicks Daily Activity Outcome Measure (Version 2)  Help from another person eating meals? 4  Help from another person taking care of personal grooming? 3  Help from another person toileting, which includes using toliet, bedpan, or urinal? 3  Help from another person bathing (including washing, rinsing, drying)? 2  Help from another person to put on and taking off regular upper body clothing? 4  Help from another person to put on and taking off regular lower body clothing? 3  6 Click Score 19  Progressive Mobility  What is the highest level of mobility based on the mobility assessment? Level 4 (Ambulates with assistance) - Balance while stepping forward/back -  Complete  Mobility Referral Yes  Activity Ambulated with assistance  OT Recommendation  Recommendations for Other  Services PT consult  Follow Up Recommendations Home health OT  Patient can return home with the following A little help with walking and/or transfers;A lot of help with bathing/dressing/bathroom  Functional Status Assessent Patient has had a recent decline in their functional status and demonstrates the ability to make significant improvements in function in a reasonable and predictable amount of time.  OT Equipment None recommended by OT (Has appropriate DME)  Individuals Consulted  Consulted and Agree with Results and Recommendations Patient;Family member/caregiver  Family Member Consulted Neighbor- Katie  Acute Rehab OT Goals  Patient Stated Goal be as independent as possible. be safe during bathing  OT Goal Formulation With patient/family  Time For Goal Achievement 03/19/24  Potential to Achieve Goals Good  OT Time Calculation  OT Start Time (ACUTE ONLY) 1053  OT Stop Time (ACUTE ONLY) 1140  OT Time Calculation (min) 47 min  OT General Charges  $OT Visit 1 Visit  OT Evaluation  $OT Eval Moderate Complexity 1 Mod  OT Treatments  $Self Care/Home Management  8-22 mins   Leita DEL OTR/L Acute Rehabilitation Services Office: [phone]

## 2024-03-05 NOTE — Progress Notes (Signed)
 PROGRESS NOTE    [name] [name]  FMW:[id number] DOB: [birth year] DOA: 03/04/2024 PCP: [name], DO    Brief Narrative:   [name] [name] is a 83 y.o. adult with past medical history significant for stage I adenocarcinoma left lung on radiation therapy, HTN, GAD/depression, DM2 (diet controlled) who presented to [facility] ED on 03/04/2024 from home with complaints of nausea and vomiting.  Patient reports 2 days of recurrent nausea resulting in 3-4 daily episodes of nonbloody, nonbilious emesis.  Additionally she reports generalized weakness which resulted in a ground-level mechanical fall day of ED arrival.  Denies striking her head.  Denies loss of consciousness.  Further denies fever, no chills, no myalgias, no cough/shortness of breath, no abdominal pain, no urinary symptoms.  In the ED, temperature 97.9 F, HR 80, RR 20, BP 161/77, SpO2 96% on room air.  WBC 3.3, hemoglobin 12.7, platelet count 103.  Sodium 141, potassium 3.6, chloride 111, CO2 16, glucose 41, BUN 11, creatinine 0.86.  AST 14, ALT less than 5, total bilirubin 0.4.  Lipase 23.  Lactic acid 0.9.  Magnesium  1.6.  Urinalysis unrevealing.  CT head without contrast with chronic atrophic and ischemic changes without acute abnormality.  CT chest/abdomen/pelvis with known left upper lobe mass slightly increased in size from prior now measuring 14 mm, stable left thyroid  nodule previously evaluated with biopsy, no acute abnormalities in the abdomen/pelvis.  Right hip/pelvis x-ray with marked severity degenerative changes, no fracture/dislocation noted.  Blood cultures x 2 collected.  Patient was given D50 x 1, Zofran  4 mg IV x 1, NS 1 L bolus.  Patient admitted for evaluation of generalized weakness, nausea/vomiting, hyperglycemia.  Assessment & Plan:   Nausea/vomiting, recurrent Patient presenting with recurrent nausea with nonbloody/nonbilious emesis over the last 2 days, limiting oral intake over that timeframe.  No evidence of acute  intra-abdominal or acute intrapelvic process on CT imaging, and no evidence of acute pancreatitis or bowel obstruction.  Suspect etiology secondary to her continued radiation treatment for lung cancer. -- Continue IV fluids, reduce D5 LR to 50 mL/h -- antiemetics, supportive care  Hypoglycemia Etiology secondary to recurrent nausea and vomiting and poor oral intake.  History of diabetes but diet controlled at baseline (not on insulin oral hypoglycemic agents).   -- Hemoglobin A1c: Pending -- Continue antiemetics, IV fluids as above -- Supportive care, close monitoring of glucose  Hypomagnesemia Etiology likely due to GI loss with nausea/vomiting as above.  Repleted. --Follow electrolytes daily  Generalized weakness Etiology likely secondary to dehydration, poor oral intake.  Urinalysis inconsistent with UTI, CT chest/abdomen/pelvis relatively unrevealing.  CT head with no evidence of acute intracranial process.  TSH within normal limits. -- OT recommends home health  -- PT evaluation: Pending -- Fall precautions  DM2 Diet controlled at baseline.  Hemoglobin A1c -- Monitor glucose every 4 hours -- Hypoglycemia precautions  Leukopenia:  WBC count 3300;  compared to most recent prior value of 6900 on 01/02/2024. While she has a history of stage I adenocarcinoma of the left upper lobe of lung, distant. She is on chemotherapy with no current evidence of contributory underlying infectious process identified at this time, although we will continue to monitor for evidence of gram-negative infection, including following for results of blood cultures x 2 collected this evening. Urinalysis showed no evidence of acute infection, no evidence of acute infection identified on today CT chest, abdomen, pelvis.  -- Blood cultures x 2: Pending -- Repeat CBC in a.m.  Stage I  adenocarcinoma of the left upper lobe of the lung  Follows with medical oncology, Dr. Sherrod outpatient.  Currently on radiation  treatment.  HTN -- Amlodipine  2.5 mg p.o. daily -- Hold home Lasix  for now  GAD/depression -- Celexa  20 mg p.o. daily -- BuSpar  10 mg p.o. 3 times daily -- Depakote  2 emphysema grams p.o. twice daily   DVT prophylaxis: SCDs Start: 03/05/24 0033    Code Status: Do not attempt resuscitation (DNR) PRE-ARREST INTERVENTIONS DESIRED Family Communication:   Disposition Plan:  Level of care: Progressive Status is: Inpatient Remains inpatient appropriate because: Weaning from IV fluids, pending PT evaluation    Consultants:  None  Procedures:  None  Antimicrobials:  None   Subjective: Patient seen examined bedside, sitting in bedside chair.  Neighbor present at bedside.  No specific complaints this morning other than weakness, headache, 1 additional episode of emesis overnight.  Did receive an additional D50 injection for glucose 68 this morning.  Currently tolerating oral intake otherwise.  Denies visual changes, no chest pain, no palpitations, no shortness of breath, no abdominal pain, no fever/chills/night sweats, no diarrhea, no focal weakness, no fatigue, no paresthesias.  No acute events overnight per nursing staff.  Objective: Vitals:   03/05/24 0346 03/05/24 0547 03/05/24 1000 03/05/24 1315  BP: (!) 149/81 (!) 148/74 (!) 140/72 138/70  Pulse: 71 75 77 73  Resp: 13 14    Temp: 98.5 F (36.9 C) 97.6 F (36.4 C) 97.9 F (36.6 C) 97.9 F (36.6 C)  TempSrc:  Oral Oral Oral  SpO2: 95% 99% 99% 99%  Weight:      Height:        Intake/Output Summary (Last 24 hours) at 03/05/2024 1633 Last data filed at 03/05/2024 1100 Gross per 24 hour  Intake 1509.74 ml  Output 600 ml  Net 909.74 ml   Filed Weights   03/04/24 1740  Weight: 70 kg    Examination:  Physical Exam: GEN: NAD, alert and oriented x 3, elderly in appearance HEENT: NCAT, PERRL, EOMI, sclera clear, MMM PULM: CTAB w/o wheezes/crackles, normal respiratory effort, room air CV: RRR w/o M/G/R GI: abd  soft, NTND, + BS MSK: no peripheral edema, moves all extremities independently NEURO: No focal neurological deficit PSYCH: normal mood/affect Integumentary: No concerning rashes/lesions/wounds noted on exposed skin surfaces    Data Reviewed: I have personally reviewed following labs and imaging studies  CBC: Recent Labs  Lab 03/04/24 1820 03/05/24 0741  WBC 3.3* 2.5*  NEUTROABS  --  1.4*  HGB 12.7 12.2  HCT 37.1 35.2*  MCV 103.9* 102.9*  PLT 103* 107*   Basic Metabolic Panel: Recent Labs  Lab 03/04/24 1820 03/04/24 2007 03/05/24 0741  NA  --  141 138  K  --  3.6 3.4*  CL  --  111 106  CO2  --  16* 20*  GLUCOSE  --  41* 173*  BUN  --  11 9  CREATININE  --  0.86 1.03*  CALCIUM  --  7.4* 9.2  MG 1.6*  --  2.2   GFR: Estimated Creatinine Clearance (by C-G formula based on SCr of 1.03 mg/dL (H)) Female: 65.3 mL/min (A) Female: 42.7 mL/min (A) Liver Function Tests: Recent Labs  Lab 03/04/24 2007 03/05/24 0741  AST 14* 17  ALT <5 <5  ALKPHOS 40 51  BILITOT 0.4 0.6  PROT 4.9* 5.9*  ALBUMIN 3.4* 4.0   Recent Labs  Lab 03/04/24 2007  LIPASE 23   No results for  input(s): AMMONIA in the last 168 hours. Coagulation Profile: No results for input(s): INR, PROTIME in the last 168 hours. Cardiac Enzymes: Recent Labs  Lab 03/05/24 0741  CKTOTAL 36*   BNP (last 3 results) No results for input(s): PROBNP in the last 8760 hours. HbA1C: No results for input(s): HGBA1C in the last 72 hours. CBG: Recent Labs  Lab 03/05/24 0651 03/05/24 0757 03/05/24 0956 03/05/24 1223 03/05/24 1441  GLUCAP 229* 172* 127* 84 116*   Lipid Profile: No results for input(s): CHOL, HDL, LDLCALC, TRIG, CHOLHDL, LDLDIRECT in the last 72 hours. Thyroid  Function Tests: Recent Labs    03/05/24 0741  TSH 2.910   Anemia Panel: Recent Labs    03/05/24 0741  VITAMINB12 830   Sepsis Labs: Recent Labs  Lab 03/04/24 1820  LATICACIDVEN 0.9    Recent  Results (from the past 240 hours)  Blood culture (routine x 2)     Status: None (Preliminary result)   Collection Time: 03/04/24  6:20 PM   Specimen: BLOOD  Result Value Ref Range Status   Specimen Description   Final    BLOOD BLOOD LEFT ARM Performed at [facility], [address] [place], [place] [address]    Special Requests   Final    BOTTLES DRAWN AEROBIC ONLY Blood Culture results may not be optimal due to an inadequate volume of blood received in culture bottles Performed at [facility], [address] [place], [place] [address]    Culture   Final    NO GROWTH < 12 HOURS Performed at [facility], [address], [place], [place] [address]    Report Status PENDING  Incomplete  MRSA Next Gen by PCR, Nasal     Status: None   Collection Time: 03/05/24  6:28 AM   Specimen: Nasal Mucosa; Nasal Swab  Result Value Ref Range Status   MRSA by PCR Next Gen NOT DETECTED NOT DETECTED Final    Comment: (NOTE) The GeneXpert MRSA Assay (FDA approved for NASAL specimens only), is one component of a comprehensive MRSA colonization surveillance program. It is not intended to diagnose MRSA infection nor to guide or monitor treatment for MRSA infections. Test performance is not FDA approved in patients less than 47 years old. Performed at [facility], [address] [place], [place] [address]          Radiology Studies: CT Head Wo Contrast Result Date: 03/04/2024 CLINICAL DATA:  History of lung carcinoma with recent fall, initial encounter EXAM: CT HEAD WITHOUT CONTRAST TECHNIQUE: Contiguous axial images were obtained from the base of the skull through the vertex without intravenous contrast. RADIATION DOSE REDUCTION: This exam was performed according to the departmental dose-optimization program which includes automated exposure control, adjustment of the mA and/or kV according to patient size and/or use of iterative  reconstruction technique. COMPARISON:  04/01/2023 FINDINGS: Brain: No evidence of acute infarction, hemorrhage, hydrocephalus, extra-axial collection or mass lesion/mass effect. Mild atrophic changes and chronic white matter ischemic changes are seen. Vascular: No hyperdense vessel or unexpected calcification. Skull: Normal. Negative for fracture or focal lesion. Sinuses/Orbits: No acute finding. Other: None. IMPRESSION: Chronic atrophic and ischemic changes without acute abnormality. Electronically Signed   By: Oneil Devonshire M.D.   On: 03/04/2024 23:36   CT CHEST ABDOMEN PELVIS W CONTRAST Result Date: 03/04/2024 CLINICAL DATA:  History of left upper lobe carcinoma with chest and abdominal pain, history of recent fall with right hip pain, initial  encounter EXAM: CT CHEST, ABDOMEN, AND PELVIS WITH CONTRAST TECHNIQUE: Multidetector CT imaging of the chest, abdomen and pelvis was performed following the standard protocol during bolus administration of intravenous contrast. RADIATION DOSE REDUCTION: This exam was performed according to the departmental dose-optimization program which includes automated exposure control, adjustment of the mA and/or kV according to patient size and/or use of iterative reconstruction technique. CONTRAST:  OMNIPAQUE  IOHEXOL  300 MG/ML  SOLN COMPARISON:  12/24/2023 FINDINGS: CT CHEST FINDINGS Cardiovascular: Atherosclerotic calcifications of the thoracic aorta are noted. No aneurysmal dilatation or dissection is noted. Heart is not significantly enlarged. Coronary calcifications are noted. The pulmonary artery shows no evidence of pulmonary embolus although not timed for embolus evaluation. Mediastinum/Nodes: Thoracic inlet again shows a heterogeneous left thyroid  nodule which shows no significant uptake on prior PET-CT. This has been previously evaluated. No hilar or mediastinal adenopathy is noted. The esophagus as visualized is within normal limits. Lungs/Pleura: Lungs are well  aerated bilaterally. Known left lung carcinoma is seen along the fissure measuring up to 14 mm. This is slightly decreased in size when compared with the prior exam. Mild traction on the adjacent fissure is seen. No focal infiltrate or sizable effusion is seen. Atelectatic changes are noted in the medial right lung base. No other suspicious nodule is seen. Musculoskeletal: Postsurgical changes in the cervical spine are noted. Degenerative changes of the thoracic spine are noted. CT ABDOMEN PELVIS FINDINGS Hepatobiliary: Fatty infiltration of the liver is noted. The gallbladder is within normal limits. Pancreas: Unremarkable. No pancreatic ductal dilatation or surrounding inflammatory changes. Spleen: Normal in size without focal abnormality. Adrenals/Urinary Tract: Adrenal glands are within normal limits. Left kidney is significantly atrophic stable from prior exams. Right kidney is within normal limits. No renal calculi or obstructive changes are noted. The bladder is well distended. Stomach/Bowel: Scattered diverticular change of the colon is noted without evidence of diverticulitis. No obstructive changes of the colon are noted. The appendix is not seen consistent with prior surgical history. Small bowel and stomach are within normal limits. Vascular/Lymphatic: Aortic atherosclerosis. No enlarged abdominal or pelvic lymph nodes. Reproductive: Status post hysterectomy. No adnexal masses. Other: No abdominal wall hernia or abnormality. No abdominopelvic ascites. Musculoskeletal: Degenerative changes of the lumbar spine are noted. IMPRESSION: CT of the chest: Known left upper lobe mass slightly decreased in size when compared with the prior PET-CT now measuring 14 mm. Stable heterogeneous left thyroid  nodule previously evaluated with biopsy. This has been evaluated on previous imaging. (ref: J Am Coll Radiol. 2015 Feb;12(2): 143-50). CT of the abdomen and pelvis: Chronic changes as described above without acute  abnormality. Electronically Signed   By: Oneil Devonshire M.D.   On: 03/04/2024 23:35   DG Hip Unilat W or Wo Pelvis 2-3 Views Right Result Date: 03/04/2024 CLINICAL DATA:  Right hip pain status post fall. EXAM: DG HIP (WITH OR WITHOUT PELVIS) 2-3V RIGHT COMPARISON:  None Available. FINDINGS: There is no evidence of an acute hip fracture or dislocation. Marked severity degenerative changes are seen in the form of joint space narrowing and acetabular sclerosis. Degenerative changes are also seen involving the visualized portion of the lower lumbar spine. Bilateral radiopaque vascular stents are seen overlying the pelvis. IMPRESSION: Marked severity degenerative changes of the right hip. Electronically Signed   By: Suzen Dials M.D.   On: 03/04/2024 19:53        Scheduled Meds:  amLODipine   2.5 mg Oral Daily   busPIRone   10 mg Oral TID  citalopram   20 mg Oral Daily   divalproex   250 mg Oral BID   potassium chloride   40 mEq Oral Once   Continuous Infusions:  dextrose  5% lactated ringers  75 mL/hr at 03/05/24 1517     LOS: 0 days    Time spent: 50 minutes spent on 03/05/2024 caring for this patient face-to-face including chart review, ordering labs/tests, documenting, discussion with nursing staff, consultants, updating family and interview/physical exam    Camellia PARAS [place], DO [facility] Available via Epic secure chat 7am-7pm After these hours, please refer to coverage provider listed on [email or web address] 03/05/2024, 4:33 PM

## 2024-03-05 NOTE — Progress Notes (Signed)
 PT Cancellation Note  Patient Details Name: [name] [name] MRN: [id number] DOB: Mar 28, 1941   Cancelled Treatment:    Reason Eval/Treat Not Completed: Fatigue/lethargy limiting ability to participate; pt states she was up in chair earlier, just got back to bed and is too tired. Continue efforts to complete PT eval    [facility] 03/05/2024, 12:26 PM

## 2024-03-05 NOTE — H&P (Signed)
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 fentanyl  in the context of her presenting recurrent nausea/vomiting.  Plan: Hold home tramadol  for now.  As needed IV fentanyl . I have added her outpatient oncologist, Dr. Sherrod, to the treatment team starting at 7 AM today (8/27).                    #) Essential Hypertension: documented h/o such, with  outpatient antihypertensive regimen including Norvasc  as well as Lasix .  SBP's in the ED today: 130s 140s mmHg. in the setting of clinical evidence of dehydration in context of recurrent nausea/vomiting over the last 2 days, will hold next dose of Lasix .  Plan: Close monitoring of subsequent BP via routine VS. home access Lasix .  Resume home amlodipine .  Monitor strict I's and O's and daily weights.                    #) GAD/depression:  documented history of such, with outpatient regimen that includes Depakote , BuSpar , Celexa .  Plan: Continue outpatient Celexa , BuSpar , Depakote .  Add on Depakote  level.  Follow-up result of TSH level.       DVT  prophylaxis: SCD's   Code Status: DNR, consistent with active DNR form on file as well as consistent with code status documentation from most recent prior hospitalization Family Communication: none Disposition Plan: Per Rounding Team Consults called: I have added her outpatient oncologist, Dr. Sherrod, to the treatment team starting at 7 AM today (8/27). ;  Admission status: Observation    I SPENT GREATER THAN 75  MINUTES IN CLINICAL CARE TIME/MEDICAL DECISION-MAKING IN COMPLETING THIS ADMISSION.     Cristina Ceniceros B Benn Tarver DO [facility] From 7PM - 7AM   03/05/2024, 12:38 AM

## 2024-03-05 NOTE — ED Provider Notes (Incomplete)
  Physical Exam  BP 132/74   Pulse 70   Temp 98.4 F (36.9 C)   Resp 17   Ht 4' 11 (1.499 m)   Wt 70 kg   SpO2 96%   BMI 31.17 kg/m   Physical Exam  Procedures  Procedures  ED Course / MDM    Medical Decision Making Amount and/or Complexity of Data Reviewed Labs: ordered. Radiology: ordered.  Risk Prescription drug management.   Patient care assumed shift handoff from [facility], [place].  Please see her note for full details.  In short, patient with history of lung cancer with radiation 2 weeks ago presents to the emergency department complaining of feeling weak with multiple episodes of diarrhea and vomiting over the past few days, beginning on Saturday.  EMS reports initial CBG of 55 and administered a small amount of D10.  Upon arrival her blood glucose was noted to be in the 40s.  She was administered dextrose .  The patient reports a fall yesterday with some right sided hip pain and patient also believe that she struck the right side of her forehead.  Patient also reports occasional shortness of breath but denies chest pain.  Plan at time of

## 2024-03-05 NOTE — ED Provider Notes (Signed)
  Physical Exam  BP 132/74   Pulse 70   Temp 98.4 F (36.9 C)   Resp 17   Ht 4' 11 (1.499 m)   Wt 70 kg   SpO2 96%   BMI 31.17 kg/m   Physical Exam  Procedures  Procedures  ED Course / MDM    Medical Decision Making Amount and/or Complexity of Data Reviewed Labs: ordered. Radiology: ordered.  Risk Prescription drug management.   Patient care assumed shift handoff from [facility], [place].  Please see her note for full details.  In short, patient with history of lung cancer with radiation 2 weeks ago presents to the emergency department complaining of feeling weak with multiple episodes of diarrhea and vomiting over the past few days, beginning on Saturday.  EMS reports initial CBG of 55 and administered a small amount of D10.  Upon arrival her blood glucose was noted to be in the 40s.  She was administered dextrose .  The patient reports a fall yesterday with some right sided hip pain and patient also believe that she struck the right side of her forehead.  Patient also reports occasional shortness of breath but denies chest pain.  Plan at time of assumption of care is to follow-up on CT imaging which was previously ordered.  Plan for likely admission to hospitalist due to severe nausea, vomiting, hypoglycemia.  Head CT shows chronic changes and no acute abnormality.  CT of the chest abdomen pelvis shows: CT of the chest: Known left upper lobe mass slightly decreased in  size when compared with the prior PET-CT now measuring 14 mm.    Stable heterogeneous left thyroid  nodule previously evaluated with  biopsy. This has been evaluated on previous imaging. (ref: J Am Coll  Radiol. 2015 Feb;12(2): 143-50).    CT of the abdomen and pelvis: Chronic changes as described above  without acute abnormality.     I have requested consultation with the hospitalist for admission for further management of this patient in the setting of recurrent nausea and vomiting and hypoglycemia.  Dr.  Marcene agrees to see the patient for admission    [name] [name] 03/05/24 [id number]    [name] Norris, MD 03/05/24 [phone]

## 2024-03-06 DIAGNOSIS — E162 Hypoglycemia, unspecified: Secondary | ICD-10-CM | POA: Diagnosis not present

## 2024-03-06 LAB — BASIC METABOLIC PANEL WITH GFR
Anion gap: 11 (ref 5–15)
BUN: <5 mg/dL — ABNORMAL LOW (ref 8–23)
CO2: 22 mmol/L (ref 22–32)
Calcium: 9.1 mg/dL (ref 8.9–10.3)
Chloride: 109 mmol/L (ref 98–111)
Creatinine, Ser: 0.86 mg/dL (ref 0.44–1.00)
GFR, Estimated: >60 mL/min (ref >60)
Glucose, Bld: 95 mg/dL (ref 70–99)
Potassium: 4 mmol/L (ref 3.5–5.1)
Sodium: 141 mmol/L (ref 135–145)

## 2024-03-06 LAB — CBC
HCT: 34.1 % — ABNORMAL LOW (ref 36.0–46.0)
Hemoglobin: 11.6 g/dL — ABNORMAL LOW (ref 12.0–15.0)
MCH: 35.3 pg — ABNORMAL HIGH (ref 26.0–34.0)
MCHC: 34 g/dL (ref 30.0–36.0)
MCV: 103.6 fL — ABNORMAL HIGH (ref 80.0–100.0)
Platelets: 102 K/uL — ABNORMAL LOW (ref 150–400)
RBC: 3.29 MIL/uL — ABNORMAL LOW (ref 3.87–5.11)
RDW: 14.1 % (ref 11.5–15.5)
WBC: 3.5 K/uL — ABNORMAL LOW (ref 4.0–10.5)
nRBC: 0 % (ref 0.0–0.2)

## 2024-03-06 LAB — GLUCOSE, CAPILLARY
Glucose-Capillary: 101 mg/dL — ABNORMAL HIGH (ref 70–99)
Glucose-Capillary: 87 mg/dL (ref 70–99)
Glucose-Capillary: 95 mg/dL (ref 70–99)
Glucose-Capillary: 131 mg/dL — ABNORMAL HIGH (ref 70–99)
Glucose-Capillary: 110 mg/dL — ABNORMAL HIGH (ref 70–99)

## 2024-03-06 LAB — PHOSPHORUS: Phosphorus: 2.3 mg/dL — ABNORMAL LOW (ref 2.5–4.6)

## 2024-03-06 LAB — MAGNESIUM: Magnesium: 1.5 mg/dL — ABNORMAL LOW (ref 1.7–2.4)

## 2024-03-06 LAB — HEMOGLOBIN A1C
Hgb A1c MFr Bld: 4.6 % — ABNORMAL LOW (ref 4.8–5.6)
Mean Plasma Glucose: 85 mg/dL

## 2024-03-06 MED ORDER — MAGNESIUM SULFATE 2 GM/50ML IV SOLN
2.0000 g | Freq: Once | INTRAVENOUS | Status: AC
  Administered 2024-03-06: 2 g via INTRAVENOUS
  Filled 2024-03-06: qty 50

## 2024-03-06 MED ORDER — SUMATRIPTAN SUCCINATE 6 MG/0.5ML ~~LOC~~ SOLN
6.0000 mg | Freq: Once | SUBCUTANEOUS | Status: AC
  Administered 2024-03-06: 6 mg via SUBCUTANEOUS
  Filled 2024-03-06: qty 0.5

## 2024-03-06 MED ORDER — SODIUM PHOSPHATES 45 MMOLE/15ML IV SOLN
15.0000 mmol | Freq: Once | INTRAVENOUS | Status: AC
  Administered 2024-03-06: 15 mmol via INTRAVENOUS
  Filled 2024-03-06: qty 5

## 2024-03-06 NOTE — Evaluation (Signed)
 Physical Therapy Evaluation Patient Details Name: [name] [name] MRN: [id number] DOB: 03-May-1941 Today's Date: 03/06/2024  History of Present Illness  83 y.o. who is admitted to [facility] on 03/04/2024 with hypoglycemia after presenting from home to [facility] ED complaining of nausea vomiting, suspected etiology secondary to her continued radiation treatment for lung cancer. Pt with mechanical fall 03/04/24 xray and CT negative. 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  Pt currently with functional limitations due to the deficits listed below (see PT Problem List). Pt will benefit from acute skilled PT to increase their independence and safety with mobility to allow discharge.  Pt reports having a difficult morning due to episode of diarrhea and start of migraine in the middle of last night.   RN into room with migraine medication beginning of session.  Pt agreeable to mobilize however only OOB to [facility] and then recliner.  Pt could benefit from increased care at home if possible due to lack of family or other support (noted from OT evaluation that neighbor has been assisting as able but concerned about safety however no visitors present in room today).         If plan is discharge home, recommend the following: Assistance with cooking/housework;Assist for transportation;Help with stairs or ramp for entrance   Can travel by private vehicle        Equipment Recommendations None recommended by PT  Recommendations for Other Services       Functional Status Assessment Patient has had a recent decline in their functional status and demonstrates the ability to make significant improvements in function in a reasonable and predictable amount of time.     Precautions / Restrictions Precautions Precautions: Fall Recall of Precautions/Restrictions: Intact      Mobility  Bed  Mobility Overal bed mobility: Needs Assistance Bed Mobility: Rolling, Sidelying to Sit Rolling: Supervision, Used rails Sidelying to sit: Min assist       General bed mobility comments: light assist to raise trunk    Transfers Overall transfer level: Needs assistance Equipment used: Rolling walker (2 wheels) Transfers: Sit to/from Stand, Bed to chair/wheelchair/BSC Sit to Stand: Contact guard assist   Step pivot transfers: Contact guard assist       General transfer comment: verbal cues for hand placement, assisted to [facility] to urinate and then 180* step pivot to recliner, declined further activity today due to migraine    Ambulation/Gait                  Stairs            Wheelchair Mobility     Tilt Bed    Modified Rankin (Stroke Patients Only)       Balance Overall balance assessment: Needs assistance           Standing balance-Leahy Scale: Fair Standing balance comment: static Fair, dynamic poor                             Pertinent Vitals/Pain Pain Assessment Pain Assessment: Faces Faces Pain Scale: Hurts worst Pain Location: migraine Pain Descriptors / Indicators: Grimacing, Sharp, Nagging, Crying Pain Intervention(s): Repositioned, Monitored during session (RN gave med beginning of session)    Home Living Family/patient expects to be discharged to:: Private residence Living Arrangements: Alone Available Help at Discharge: Neighbor Type of Home: Mobile home Home Access:  Ramped entrance       Home Layout: One level Home Equipment: Rollator (4 wheels);BSC/3in1;Shower seat;Wheelchair - manual;Tub bench;Hand held shower head Additional Comments: meals on wheels, decreased family support since daughters death    Prior Function Prior Level of Function : Needs assist             Mobility Comments: rollator and RW interchangably ADLs Comments: showering is hard (admits to sponge bathing at kitchen sink, longer stretches  inbetween than she would like) meals on wheels providing food, neighbor helps with cleaning. dresses herself, toilets herself     Extremity/Trunk Assessment        Lower Extremity Assessment Lower Extremity Assessment: Overall WFL for tasks assessed;LLE deficits/detail LLE Deficits / Details: pointed to left hip - reports soreness since fall    Cervical / Trunk Assessment Cervical / Trunk Assessment: Kyphotic  Communication   Communication Communication: No apparent difficulties    Cognition Arousal: Alert Behavior During Therapy: WFL for tasks assessed/performed   PT - Cognitive impairments: No apparent impairments                         Following commands: Intact       Cueing Cueing Techniques: Verbal cues, Visual cues     General Comments      Exercises     Assessment/Plan    PT Assessment Patient needs continued PT services  PT Problem List Decreased strength;Decreased activity tolerance;Decreased balance;Decreased mobility;Decreased knowledge of use of DME       PT Treatment Interventions DME instruction;Gait training;Balance training;Therapeutic activities;Therapeutic exercise;Patient/family education;Functional mobility training;Stair training    PT Goals (Current goals can be found in the Care Plan section)  Acute Rehab PT Goals PT Goal Formulation: With patient Time For Goal Achievement: 03/20/24 Potential to Achieve Goals: Good    Frequency Min 3X/week     Co-evaluation               AM-PAC PT 6 Clicks Mobility  Outcome Measure Help needed turning from your back to your side while in a flat bed without using bedrails?: A Little Help needed moving from lying on your back to sitting on the side of a flat bed without using bedrails?: A Little Help needed moving to and from a bed to a chair (including a wheelchair)?: A Little Help needed standing up from a chair using your arms (e.g., wheelchair or bedside chair)?: A  Little Help needed to walk in hospital room?: A Little Help needed climbing 3-5 steps with a railing? : A Lot 6 Click Score: 17    End of Session Equipment Utilized During Treatment: Gait belt Activity Tolerance: Patient tolerated treatment well Patient left: in chair;with call bell/phone within reach;with chair alarm set Nurse Communication: Mobility status PT Visit Diagnosis: Difficulty in walking, not elsewhere classified (R26.2)    Time: 8875-8856 PT Time Calculation (min) (ACUTE ONLY): 19 min   Charges:   PT Evaluation $PT Eval Low Complexity: 1 Low   PT General Charges $$ ACUTE PT VISIT: 1 Visit        Tari PT, DPT Physical Therapist Acute Rehabilitation Services Office: [phone]   Tari CROME [place] 03/06/2024, 1:57 PM

## 2024-03-06 NOTE — Progress Notes (Signed)
 PROGRESS NOTE    [name] [name]  FMW:[id number] DOB: November 07, 1940 DOA: 03/04/2024 PCP: [name], DO    Brief Narrative:   [name] [name] is a 83 y.o. adult with past medical history significant for stage I adenocarcinoma left lung on radiation therapy, HTN, GAD/depression, DM2 (diet controlled) who presented to [facility] ED on 03/04/2024 from home with complaints of nausea and vomiting.  Patient reports 2 days of recurrent nausea resulting in 3-4 daily episodes of nonbloody, nonbilious emesis.  Additionally she reports generalized weakness which resulted in a ground-level mechanical fall day of ED arrival.  Denies striking her head.  Denies loss of consciousness.  Further denies fever, no chills, no myalgias, no cough/shortness of breath, no abdominal pain, no urinary symptoms.  In the ED, temperature 97.9 F, HR 80, RR 20, BP 161/77, SpO2 96% on room air.  WBC 3.3, hemoglobin 12.7, platelet count 103.  Sodium 141, potassium 3.6, chloride 111, CO2 16, glucose 41, BUN 11, creatinine 0.86.  AST 14, ALT less than 5, total bilirubin 0.4.  Lipase 23.  Lactic acid 0.9.  Magnesium  1.6.  Urinalysis unrevealing.  CT head without contrast with chronic atrophic and ischemic changes without acute abnormality.  CT chest/abdomen/pelvis with known left upper lobe mass slightly increased in size from prior now measuring 14 mm, stable left thyroid  nodule previously evaluated with biopsy, no acute abnormalities in the abdomen/pelvis.  Right hip/pelvis x-ray with marked severity degenerative changes, no fracture/dislocation noted.  Blood cultures x 2 collected.  Patient was given D50 x 1, Zofran  4 mg IV x 1, NS 1 L bolus.  Patient admitted for evaluation of generalized weakness, nausea/vomiting, hyperglycemia.  Assessment & Plan:   Nausea/vomiting, recurrent Patient presenting with recurrent nausea with nonbloody/nonbilious emesis over the last 2 days, limiting oral intake over that timeframe.  No evidence of acute  intra-abdominal or acute intrapelvic process on CT imaging, and no evidence of acute pancreatitis or bowel obstruction.  Suspect etiology secondary to her continued radiation treatment for lung cancer.  Initially treated with IV fluids, now stopped -- antiemetics, supportive care; encourage increased oral intake  Hypoglycemia Etiology secondary to recurrent nausea and vomiting and poor oral intake.  History of diabetes but diet controlled at baseline (not on insulin oral hypoglycemic agents).   -- Hemoglobin A1c: 4.6% -- Continue antiemetics PRN -- Supportive care, close monitoring of glucose  Diarrhea Patient with 3 episodes of diarrhea overnight.  Afebrile without leukocytosis. -- GI PCR panel: Pending -- Strict I's and O's -- Enteric precautions  Migraine headache -- Imitrex  subcutaneously x 1 -- Tylenol  as needed  Hypomagnesemia Etiology likely due to GI loss with nausea/vomiting as above.  Repleted. -- Magnesium  1.5, will replete -- Repeat electrolytes in the a.m.  Hypophosphatemia Phosphorus 2.3, will replete -- Repeat electrolytes in a.m.  Generalized weakness Etiology likely secondary to dehydration, poor oral intake.  Urinalysis inconsistent with UTI, CT chest/abdomen/pelvis relatively unrevealing.  CT head with no evidence of acute intracranial process.  TSH within normal limits. -- OT recommends home health  -- PT evaluation: Pending -- Fall precautions  DM2 Diet controlled at baseline.  Hemoglobin A1c -- Monitor glucose every 4 hours -- Hypoglycemia precautions  Leukopenia:  WBC count 3300;  compared to most recent prior value of 6900 on 01/02/2024. While she has a history of stage I adenocarcinoma of the left upper lobe of lung, distant. She is on chemotherapy with no current evidence of contributory underlying infectious process identified at this time, although  we will continue to monitor for evidence of gram-negative infection, including following for results of  blood cultures x 2 collected this evening. Urinalysis showed no evidence of acute infection, no evidence of acute infection identified on today CT chest, abdomen, pelvis.  -- Blood cultures x 2: no growth < 12h -- Repeat CBC in a.m.  Stage I adenocarcinoma of the left upper lobe of the lung  Follows with medical oncology, Dr. Sherrod outpatient.  Currently on radiation treatment.  HTN -- Amlodipine  2.5 mg p.o. daily -- Hold home Lasix  for now  GAD/depression -- Celexa  20 mg p.o. daily -- BuSpar  10 mg p.o. 3 times daily -- Depakote  250 mg p.o. twice daily   DVT prophylaxis: SCDs Start: 03/05/24 0033    Code Status: Do not attempt resuscitation (DNR) PRE-ARREST INTERVENTIONS DESIRED Family Communication: No family present bedside this morning  Disposition Plan:  Level of care: Med-Surg Status is: Inpatient Remains inpatient appropriate because: Electrolyte replacement    Consultants:  None  Procedures:  None  Antimicrobials:  None   Subjective: Patient seen examined bedside, lying in bed.  Reports not feeling well.  Three episodes of diarrhea overnight, GI PCR panel ordered by covering physician.  Denies any further vomiting but currently does not have appetite.  Continues with headache, Imitrex  ordered.  Denies visual changes, no chest pain, no palpitations, no shortness of breath, no abdominal pain, no fever/chills/night sweats, no diarrhea, no focal weakness, no fatigue, no paresthesias.  No acute events overnight per nursing staff.  Objective: Vitals:   03/05/24 1315 03/05/24 2018 03/06/24 0455 03/06/24 0500  BP: 138/70 (!) 152/66 (!) 146/70   Pulse: 73 82 82   Resp:  16 20   Temp: 97.9 F (36.6 C) 98.6 F (37 C) 98.6 F (37 C)   TempSrc: Oral Oral Oral   SpO2: 99% 91% 95%   Weight:    71 kg  Height:        Intake/Output Summary (Last 24 hours) at 03/06/2024 1154 Last data filed at 03/05/2024 2200 Gross per 24 hour  Intake 1546.58 ml  Output --  Net  1546.58 ml   Filed Weights   03/04/24 1740 03/06/24 0500  Weight: 70 kg 71 kg    Examination:  Physical Exam: GEN: NAD, alert and oriented x 3, elderly in appearance HEENT: NCAT, PERRL, EOMI, sclera clear, MMM PULM: CTAB w/o wheezes/crackles, normal respiratory effort, room air CV: RRR w/o M/G/R GI: abd soft, NTND, + BS MSK: no peripheral edema, moves all extremities independently NEURO: No focal neurological deficit PSYCH: normal mood/affect Integumentary: No concerning rashes/lesions/wounds noted on exposed skin surfaces    Data Reviewed: I have personally reviewed following labs and imaging studies  CBC: Recent Labs  Lab 03/04/24 1820 03/05/24 0741 03/06/24 0420  WBC 3.3* 2.5* 3.5*  NEUTROABS  --  1.4*  --   HGB 12.7 12.2 11.6*  HCT 37.1 35.2* 34.1*  MCV 103.9* 102.9* 103.6*  PLT 103* 107* 102*   Basic Metabolic Panel: Recent Labs  Lab 03/04/24 1820 03/04/24 2007 03/05/24 0741 03/06/24 0420  NA  --  141 138 141  K  --  3.6 3.4* 4.0  CL  --  111 106 109  CO2  --  16* 20* 22  GLUCOSE  --  41* 173* 95  BUN  --  11 9 <5*  CREATININE  --  0.86 1.03* 0.86  CALCIUM  --  7.4* 9.2 9.1  MG 1.6*  --  2.2 1.5*  PHOS  --   --   --  2.3*   GFR: Estimated Creatinine Clearance (by C-G formula based on SCr of 0.86 mg/dL) Female: 58.2 mL/min Female: 51.6 mL/min Liver Function Tests: Recent Labs  Lab 03/04/24 2007 03/05/24 0741  AST 14* 17  ALT <5 <5  ALKPHOS 40 51  BILITOT 0.4 0.6  PROT 4.9* 5.9*  ALBUMIN 3.4* 4.0   Recent Labs  Lab 03/04/24 2007  LIPASE 23   No results for input(s): AMMONIA in the last 168 hours. Coagulation Profile: No results for input(s): INR, PROTIME in the last 168 hours. Cardiac Enzymes: Recent Labs  Lab 03/05/24 0741  CKTOTAL 36*   BNP (last 3 results) No results for input(s): PROBNP in the last 8760 hours. HbA1C: Recent Labs    03/05/24 0741  HGBA1C 4.6*   CBG: Recent Labs  Lab 03/05/24 2000  03/05/24 2357 03/06/24 0515 03/06/24 0739 03/06/24 1128  GLUCAP 115* 94 101* 87 95   Lipid Profile: No results for input(s): CHOL, HDL, LDLCALC, TRIG, CHOLHDL, LDLDIRECT in the last 72 hours. Thyroid  Function Tests: Recent Labs    03/05/24 0741  TSH 2.910   Anemia Panel: Recent Labs    03/05/24 0741  VITAMINB12 830   Sepsis Labs: Recent Labs  Lab 03/04/24 1820  LATICACIDVEN 0.9    Recent Results (from the past 240 hours)  Blood culture (routine x 2)     Status: None (Preliminary result)   Collection Time: 03/04/24  6:20 PM   Specimen: BLOOD  Result Value Ref Range Status   Specimen Description   Final    BLOOD BLOOD LEFT ARM Performed at [facility], [address] [place], [place] [address]    Special Requests   Final    BOTTLES DRAWN AEROBIC ONLY Blood Culture results may not be optimal due to an inadequate volume of blood received in culture bottles Performed at [facility], [address], [place], [place] [address]    Culture   Final    NO GROWTH < 12 HOURS Performed at [facility], [address], [place], [place] [address]    Report Status PENDING  Incomplete  MRSA Next Gen by PCR, Nasal     Status: None   Collection Time: 03/05/24  6:28 AM   Specimen: Nasal Mucosa; Nasal Swab  Result Value Ref Range Status   MRSA by PCR Next Gen NOT DETECTED NOT DETECTED Final    Comment: (NOTE) The GeneXpert MRSA Assay (FDA approved for NASAL specimens only), is one component of a comprehensive MRSA colonization surveillance program. It is not intended to diagnose MRSA infection nor to guide or monitor treatment for MRSA infections. Test performance is not FDA approved in patients less than 67 years old. Performed at [facility], [address], [place], [place] [address]          Radiology Studies: CT Head Wo Contrast Result Date: 03/04/2024 CLINICAL DATA:  History of lung  carcinoma with recent fall, initial encounter EXAM: CT HEAD WITHOUT CONTRAST TECHNIQUE: Contiguous axial images were obtained from the base of the skull through the vertex without intravenous contrast. RADIATION DOSE REDUCTION: This exam was performed according to the departmental dose-optimization program which includes automated exposure control, adjustment of the mA and/or kV according to patient size and/or use of iterative reconstruction technique. COMPARISON:  04/01/2023 FINDINGS: Brain: No evidence of acute infarction, hemorrhage, hydrocephalus, extra-axial collection or mass lesion/mass effect. Mild atrophic changes and  chronic white matter ischemic changes are seen. Vascular: No hyperdense vessel or unexpected calcification. Skull: Normal. Negative for fracture or focal lesion. Sinuses/Orbits: No acute finding. Other: None. IMPRESSION: Chronic atrophic and ischemic changes without acute abnormality. Electronically Signed   By: Oneil Devonshire M.D.   On: 03/04/2024 23:36   CT CHEST ABDOMEN PELVIS W CONTRAST Result Date: 03/04/2024 CLINICAL DATA:  History of left upper lobe carcinoma with chest and abdominal pain, history of recent fall with right hip pain, initial encounter EXAM: CT CHEST, ABDOMEN, AND PELVIS WITH CONTRAST TECHNIQUE: Multidetector CT imaging of the chest, abdomen and pelvis was performed following the standard protocol during bolus administration of intravenous contrast. RADIATION DOSE REDUCTION: This exam was performed according to the departmental dose-optimization program which includes automated exposure control, adjustment of the mA and/or kV according to patient size and/or use of iterative reconstruction technique. CONTRAST:  OMNIPAQUE  IOHEXOL  300 MG/ML  SOLN COMPARISON:  12/24/2023 FINDINGS: CT CHEST FINDINGS Cardiovascular: Atherosclerotic calcifications of the thoracic aorta are noted. No aneurysmal dilatation or dissection is noted. Heart is not significantly enlarged.  Coronary calcifications are noted. The pulmonary artery shows no evidence of pulmonary embolus although not timed for embolus evaluation. Mediastinum/Nodes: Thoracic inlet again shows a heterogeneous left thyroid  nodule which shows no significant uptake on prior PET-CT. This has been previously evaluated. No hilar or mediastinal adenopathy is noted. The esophagus as visualized is within normal limits. Lungs/Pleura: Lungs are well aerated bilaterally. Known left lung carcinoma is seen along the fissure measuring up to 14 mm. This is slightly decreased in size when compared with the prior exam. Mild traction on the adjacent fissure is seen. No focal infiltrate or sizable effusion is seen. Atelectatic changes are noted in the medial right lung base. No other suspicious nodule is seen. Musculoskeletal: Postsurgical changes in the cervical spine are noted. Degenerative changes of the thoracic spine are noted. CT ABDOMEN PELVIS FINDINGS Hepatobiliary: Fatty infiltration of the liver is noted. The gallbladder is within normal limits. Pancreas: Unremarkable. No pancreatic ductal dilatation or surrounding inflammatory changes. Spleen: Normal in size without focal abnormality. Adrenals/Urinary Tract: Adrenal glands are within normal limits. Left kidney is significantly atrophic stable from prior exams. Right kidney is within normal limits. No renal calculi or obstructive changes are noted. The bladder is well distended. Stomach/Bowel: Scattered diverticular change of the colon is noted without evidence of diverticulitis. No obstructive changes of the colon are noted. The appendix is not seen consistent with prior surgical history. Small bowel and stomach are within normal limits. Vascular/Lymphatic: Aortic atherosclerosis. No enlarged abdominal or pelvic lymph nodes. Reproductive: Status post hysterectomy. No adnexal masses. Other: No abdominal wall hernia or abnormality. No abdominopelvic ascites. Musculoskeletal:  Degenerative changes of the lumbar spine are noted. IMPRESSION: CT of the chest: Known left upper lobe mass slightly decreased in size when compared with the prior PET-CT now measuring 14 mm. Stable heterogeneous left thyroid  nodule previously evaluated with biopsy. This has been evaluated on previous imaging. (ref: J Am Coll Radiol. 2015 Feb;12(2): 143-50). CT of the abdomen and pelvis: Chronic changes as described above without acute abnormality. Electronically Signed   By: Oneil Devonshire M.D.   On: 03/04/2024 23:35   DG Hip Unilat W or Wo Pelvis 2-3 Views Right Result Date: 03/04/2024 CLINICAL DATA:  Right hip pain status post fall. EXAM: DG HIP (WITH OR WITHOUT PELVIS) 2-3V RIGHT COMPARISON:  None Available. FINDINGS: There is no evidence of an acute hip fracture or dislocation. Marked severity  degenerative changes are seen in the form of joint space narrowing and acetabular sclerosis. Degenerative changes are also seen involving the visualized portion of the lower lumbar spine. Bilateral radiopaque vascular stents are seen overlying the pelvis. IMPRESSION: Marked severity degenerative changes of the right hip. Electronically Signed   By: Suzen Dials M.D.   On: 03/04/2024 19:53        Scheduled Meds:  amLODipine   2.5 mg Oral Daily   busPIRone   10 mg Oral TID   citalopram   20 mg Oral Daily   divalproex   250 mg Oral BID   Continuous Infusions:  sodium PHOSPHATE  IVPB (in mmol)       LOS: 1 day    Time spent: 45 minutes spent on 03/06/2024 caring for this patient face-to-face including chart review, ordering labs/tests, documenting, discussion with nursing staff, consultants, updating family and interview/physical exam    Camellia PARAS [place], DO [facility] Available via Epic secure chat 7am-7pm After these hours, please refer to coverage provider listed on [email or web address] 03/06/2024, 11:54 AM

## 2024-03-06 NOTE — Progress Notes (Signed)
 Occupational Therapy Treatment Patient Details Name: NAW LASALA MRN: [id number] DOB: [birth year] Today's Date: 03/06/2024   History of present illness 83 yr old female who was admitted to [facility] on 03/04/2024 with hypoglycemia after presenting from home due to nausea vomiting, suspected etiology secondary to her continued radiation treatment for lung cancer. Pt with mechanical fall 03/04/24 x ray and CT negative. PMH: stage Ia adenocarcinoma of the left lung, undergoing radiation therapy, hypertension, GAD, depression,   OT comments  The pt required CGA for supine to sit. She presented with good sitting balance EOB. She further performed hair combing seated EOB, then sit to stand using a RW. She was instructed on lateral stepping along the EOB, for which she required CGA. She reported headache and R hip discomfort. Continue OT plan of care. Home health OT is recommended.       If plan is discharge home, recommend the following:  A little help with walking and/or transfers;A little help with bathing/dressing/bathroom   Equipment Recommendations  None recommended by OT    Recommendations for Other Services      Precautions / Restrictions Precautions Precautions: Fall Restrictions Weight Bearing Restrictions Per Provider Order: No       Mobility Bed Mobility Overal bed mobility: Needs Assistance Bed Mobility: Supine to Sit, Sit to Supine     Supine to sit: Contact guard, HOB elevated, Used rails Sit to supine: Min assist        Transfers Overall transfer level: Needs assistance Equipment used: Rolling walker (2 wheels) Transfers: Sit to/from Stand Sit to Stand: Contact guard assist           General transfer comment:  (Once in standing, she was instructed on lateral stepping along the EOB using a RW. She required CGA in this regard.)     Balance     Sitting balance-Leahy Scale: Good       Standing balance-Leahy Scale: Fair          ADL either  performed or assessed with clinical judgement   ADL Overall ADL's : Needs assistance/impaired     Grooming: Brushing hair;Minimal assistance;Sitting Grooming Details (indicate cue type and reason): The pt performed hair combing seated EOB, requiring min assist to comb posterior aspect of her head only.               Extremity/Trunk Assessment     Lower Extremity Assessment Lower Extremity Assessment: Overall WFL for tasks assessed;LLE deficits/detail LLE Deficits / Details: pointed to left hip - reports soreness since fall                Communication Communication Communication: No apparent difficulties   Cognition Arousal: Alert Behavior During Therapy: WFL for tasks assessed/performed        Following commands: Intact        Cueing   Cueing Techniques: Verbal cues  Exercises              Pertinent Vitals/ Pain       Pain Assessment Pain Assessment: 0-10 Pain Score: 5  Pain Location: migraine Pain Intervention(s): Monitored during session, Repositioned  Home Living Family/patient expects to be discharged to:: Private residence Living Arrangements: Alone Available Help at Discharge: Neighbor Type of Home: Mobile home Home Access: Ramped entrance     Home Layout: One level     Bathroom Shower/Tub: Tub/shower unit (garden tub)   Bathroom Toilet: Handicapped height Bathroom Accessibility: Yes   Home Equipment: Rollator (4 wheels);BSC/3in1;Shower seat;Wheelchair -  manual;Tub bench;Hand held shower head   Additional Comments: meals on wheels, decreased family support since daughters death          Frequency  Min 2X/week        Progress Toward Goals  OT Goals(current goals can now be found in the care plan section)     Acute Rehab OT Goals Patient Stated Goal: be as independent as possible OT Goal Formulation: With patient Time For Goal Achievement: 03/19/24 Potential to Achieve Goals: Good ADL Goals Pt Will Perform Lower Body  Dressing: with supervision;with adaptive equipment;sitting/lateral leans;sit to/from stand Pt Will Transfer to Toilet: with supervision;ambulating Pt Will Perform Toileting - Clothing Manipulation and hygiene: with supervision;sit to/from stand  Plan         AM-PAC OT 6 Clicks Daily Activity     Outcome Measure   Help from another person eating meals?: None Help from another person taking care of personal grooming?: None Help from another person toileting, which includes using toliet, bedpan, or urinal?: A Little Help from another person bathing (including washing, rinsing, drying)?: A Lot Help from another person to put on and taking off regular upper body clothing?: None Help from another person to put on and taking off regular lower body clothing?: A Little 6 Click Score: 20    End of Session Equipment Utilized During Treatment: Rolling walker (2 wheels)  OT Visit Diagnosis: Unsteadiness on feet (R26.81);Other abnormalities of gait and mobility (R26.89);Muscle weakness (generalized) (M62.81);History of falling (Z91.81);Pain Pain - Right/Left: Right Pain - part of body: Hip (and headache)   Activity Tolerance Patient tolerated treatment well   Patient Left in bed;with bed alarm set;with call bell/phone within reach;with family/visitor present   Nurse Communication Mobility status        Time: 8358-8292 OT Time Calculation (min): 26 min  Charges: OT General Charges $OT Visit: 1 Visit OT Treatments $Self Care/Home Management : 8-22 mins $Therapeutic Activity: 8-22 mins     Delanna JINNY Lesches, OTR/L 03/06/2024, 5:37 PM

## 2024-03-07 DIAGNOSIS — E162 Hypoglycemia, unspecified: Secondary | ICD-10-CM | POA: Diagnosis not present

## 2024-03-07 LAB — MAGNESIUM: Magnesium: 1.9 mg/dL (ref 1.7–2.4)

## 2024-03-07 LAB — GASTROINTESTINAL PANEL BY PCR, STOOL (REPLACES STOOL CULTURE)

## 2024-03-07 LAB — GLUCOSE, CAPILLARY
Glucose-Capillary: 105 mg/dL — ABNORMAL HIGH (ref 70–99)
Glucose-Capillary: 95 mg/dL (ref 70–99)
Glucose-Capillary: 90 mg/dL (ref 70–99)
Glucose-Capillary: 91 mg/dL (ref 70–99)
Glucose-Capillary: 89 mg/dL (ref 70–99)
Glucose-Capillary: 86 mg/dL (ref 70–99)

## 2024-03-07 LAB — BASIC METABOLIC PANEL WITH GFR
Anion gap: 11 (ref 5–15)
BUN: <5 mg/dL — ABNORMAL LOW (ref 8–23)
CO2: 24 mmol/L (ref 22–32)
Calcium: 8.9 mg/dL (ref 8.9–10.3)
Chloride: 109 mmol/L (ref 98–111)
Creatinine, Ser: 0.86 mg/dL (ref 0.44–1.00)
GFR, Estimated: >60 mL/min (ref >60)
Glucose, Bld: 84 mg/dL (ref 70–99)
Potassium: 3.8 mmol/L (ref 3.5–5.1)
Sodium: 143 mmol/L (ref 135–145)

## 2024-03-07 LAB — CBC
HCT: 37.1 % (ref 36.0–46.0)
Hemoglobin: 12.3 g/dL (ref 12.0–15.0)
MCH: 34.6 pg — ABNORMAL HIGH (ref 26.0–34.0)
MCHC: 33.2 g/dL (ref 30.0–36.0)
MCV: 104.5 fL — ABNORMAL HIGH (ref 80.0–100.0)
Platelets: 94 K/uL — ABNORMAL LOW (ref 150–400)
RBC: 3.55 MIL/uL — ABNORMAL LOW (ref 3.87–5.11)
RDW: 14.5 % (ref 11.5–15.5)
WBC: 3.3 K/uL — ABNORMAL LOW (ref 4.0–10.5)
nRBC: 0 % (ref 0.0–0.2)

## 2024-03-07 LAB — PHOSPHORUS: Phosphorus: 2.6 mg/dL (ref 2.5–4.6)

## 2024-03-07 MED ORDER — KETOROLAC TROMETHAMINE 15 MG/ML IJ SOLN
15.0000 mg | Freq: Three times a day (TID) | INTRAMUSCULAR | Status: AC | PRN
  Administered 2024-03-07 – 2024-03-09 (×5): 15 mg via INTRAVENOUS
  Filled 2024-03-07 (×5): qty 1

## 2024-03-07 MED ORDER — SUMATRIPTAN SUCCINATE 6 MG/0.5ML ~~LOC~~ SOLN
6.0000 mg | Freq: Once | SUBCUTANEOUS | Status: AC
Start: 2024-03-07 — End: 2024-03-07
  Administered 2024-03-07: 6 mg via SUBCUTANEOUS
  Filled 2024-03-07: qty 0.5

## 2024-03-07 NOTE — Progress Notes (Signed)
 PROGRESS NOTE    [name] [name]  FMW:[id number] DOB: [birth year] DOA: 03/04/2024 PCP: [name], DO    Brief Narrative:   [name] [name] is a 83 y.o. adult with past medical history significant for stage I adenocarcinoma left lung on radiation therapy, HTN, GAD/depression, DM2 (diet controlled) who presented to [facility] ED on 03/04/2024 from home with complaints of nausea and vomiting.  Patient reports 2 days of recurrent nausea resulting in 3-4 daily episodes of nonbloody, nonbilious emesis.  Additionally she reports generalized weakness which resulted in a ground-level mechanical fall day of ED arrival.  Denies striking her head.  Denies loss of consciousness.  Further denies fever, no chills, no myalgias, no cough/shortness of breath, no abdominal pain, no urinary symptoms.  In the ED, temperature 97.9 F, HR 80, RR 20, BP 161/77, SpO2 96% on room air.  WBC 3.3, hemoglobin 12.7, platelet count 103.  Sodium 141, potassium 3.6, chloride 111, CO2 16, glucose 41, BUN 11, creatinine 0.86.  AST 14, ALT less than 5, total bilirubin 0.4.  Lipase 23.  Lactic acid 0.9.  Magnesium  1.6.  Urinalysis unrevealing.  CT head without contrast with chronic atrophic and ischemic changes without acute abnormality.  CT chest/abdomen/pelvis with known left upper lobe mass slightly increased in size from prior now measuring 14 mm, stable left thyroid  nodule previously evaluated with biopsy, no acute abnormalities in the abdomen/pelvis.  Right hip/pelvis x-ray with marked severity degenerative changes, no fracture/dislocation noted.  Blood cultures x 2 collected.  Patient was given D50 x 1, Zofran  4 mg IV x 1, NS 1 L bolus.  Patient admitted for evaluation of generalized weakness, nausea/vomiting, hyperglycemia.  Assessment & Plan:   Nausea/vomiting, recurrent Patient presenting with recurrent nausea with nonbloody/nonbilious emesis over the last 2 days, limiting oral intake over that timeframe.  No evidence of acute  intra-abdominal or acute intrapelvic process on CT imaging, and no evidence of acute pancreatitis or bowel obstruction.  Suspect etiology secondary to her continued radiation treatment for lung cancer.  Initially treated with IV fluids, now stopped -- antiemetics, supportive care; encourage increased oral intake  Hypoglycemia Etiology secondary to recurrent nausea and vomiting and poor oral intake.  History of diabetes but diet controlled at baseline (not on insulin oral hypoglycemic agents).   -- Hemoglobin A1c: 4.6% -- Continue antiemetics PRN -- Supportive care, close monitoring of glucose  Diarrhea Patient with 3 episodes of diarrhea overnight.  Afebrile without leukocytosis. -- GI PCR panel: Pending -- Strict I's and O's -- Enteric precautions  Migraine headache -- Toradol  15 mg IV q8h PRN -- Tylenol  as needed  Hypomagnesemia Etiology likely due to GI loss with nausea/vomiting as above.  Repleted. -- Repeat electrolytes in the a.m.  Hypophosphatemia Phosphorus 2.3, will replete -- Repeat electrolytes in a.m.  Generalized weakness Etiology likely secondary to dehydration, poor oral intake.  Urinalysis inconsistent with UTI, CT chest/abdomen/pelvis relatively unrevealing.  CT head with no evidence of acute intracranial process.  TSH within normal limits. -- PT/OT recommends home health  -- Fall precautions  DM2 Diet controlled at baseline.  Hemoglobin A1c 4.6. -- Monitor glucose every 4 hours -- Hypoglycemia precautions  Leukopenia:  WBC count 3300;  compared to most recent prior value of 6900 on 01/02/2024. While she has a history of stage I adenocarcinoma of the left upper lobe of lung, distant. She is on chemotherapy with no current evidence of contributory underlying infectious process identified at this time, although we will continue to monitor for  evidence of gram-negative infection, including following for results of blood cultures x 2 collected this evening.  Urinalysis showed no evidence of acute infection, no evidence of acute infection identified on today CT chest, abdomen, pelvis.  -- Blood cultures x 2: no growth x 3 days -- Repeat CBC in a.m.  Stage I adenocarcinoma of the left upper lobe of the lung  Follows with medical oncology, Dr. Sherrod outpatient.  Currently on radiation treatment.  HTN -- Amlodipine  2.5 mg p.o. daily -- Hold home Lasix  for now  GAD/depression -- Celexa  20 mg p.o. daily -- BuSpar  10 mg p.o. 3 times daily -- Depakote  250 mg p.o. twice daily   DVT prophylaxis: SCDs Start: 03/05/24 0033    Code Status: Do not attempt resuscitation (DNR) PRE-ARREST INTERVENTIONS DESIRED Family Communication: No family present bedside this morning  Disposition Plan:  Level of care: Med-Surg Status is: Inpatient Remains inpatient appropriate because: Anticipate discharge likely tomorrow    Consultants:  None  Procedures:  None  Antimicrobials:  None   Subjective: Patient seen examined bedside, lying in bed.  Continues report headache/migraine.  Received Imitrex  yesterday.  Will start IV Toradol  today.  GI PCR panel collected and pending.  Patient denies any further emesis, no further diarrhea overnight.  Denies visual changes, no chest pain, no palpitations, no shortness of breath, no abdominal pain, no fever/chills/night sweats, no diarrhea, no focal weakness, no fatigue, no paresthesias.  No acute events overnight per nursing staff.  Objective: Vitals:   03/06/24 0500 03/06/24 2124 03/07/24 0452 03/07/24 0603  BP:  129/76  (!) 154/84  Pulse:  84  (!) 104  Resp:  18  18  Temp:  98.8 F (37.1 C)  98.4 F (36.9 C)  TempSrc:  Oral  Oral  SpO2:  96%  96%  Weight: 71 kg  70.3 kg   Height:        Intake/Output Summary (Last 24 hours) at 03/07/2024 1135 Last data filed at 03/06/2024 2157 Gross per 24 hour  Intake 542.75 ml  Output --  Net 542.75 ml   Filed Weights   03/04/24 1740 03/06/24 0500 03/07/24 0452   Weight: 70 kg 71 kg 70.3 kg    Examination:  Physical Exam: GEN: NAD, alert and oriented x 3, elderly in appearance HEENT: NCAT, PERRL, EOMI, sclera clear, MMM PULM: CTAB w/o wheezes/crackles, normal respiratory effort, room air CV: RRR w/o M/G/R GI: abd soft, NTND, + BS MSK: no peripheral edema, moves all extremities independently NEURO: No focal neurological deficit PSYCH: normal mood/affect Integumentary: No concerning rashes/lesions/wounds noted on exposed skin surfaces    Data Reviewed: I have personally reviewed following labs and imaging studies  CBC: Recent Labs  Lab 03/04/24 1820 03/05/24 0741 03/06/24 0420 03/07/24 0426  WBC 3.3* 2.5* 3.5* 3.3*  NEUTROABS  --  1.4*  --   --   HGB 12.7 12.2 11.6* 12.3  HCT 37.1 35.2* 34.1* 37.1  MCV 103.9* 102.9* 103.6* 104.5*  PLT 103* 107* 102* 94*   Basic Metabolic Panel: Recent Labs  Lab 03/04/24 1820 03/04/24 2007 03/05/24 0741 03/06/24 0420 03/07/24 0426  NA  --  141 138 141 143  K  --  3.6 3.4* 4.0 3.8  CL  --  111 106 109 109  CO2  --  16* 20* 22 24  GLUCOSE  --  41* 173* 95 84  BUN  --  11 9 <5* <5*  CREATININE  --  0.86 1.03* 0.86 0.86  CALCIUM  --  7.4* 9.2 9.1 8.9  MG 1.6*  --  2.2 1.5* 1.9  PHOS  --   --   --  2.3* 2.6   GFR: Estimated Creatinine Clearance (by C-G formula based on SCr of 0.86 mg/dL) Female: 58.4 mL/min Female: 51.3 mL/min Liver Function Tests: Recent Labs  Lab 03/04/24 2007 03/05/24 0741  AST 14* 17  ALT <5 <5  ALKPHOS 40 51  BILITOT 0.4 0.6  PROT 4.9* 5.9*  ALBUMIN 3.4* 4.0   Recent Labs  Lab 03/04/24 2007  LIPASE 23   No results for input(s): AMMONIA in the last 168 hours. Coagulation Profile: No results for input(s): INR, PROTIME in the last 168 hours. Cardiac Enzymes: Recent Labs  Lab 03/05/24 0741  CKTOTAL 36*   BNP (last 3 results) No results for input(s): PROBNP in the last 8760 hours. HbA1C: Recent Labs    03/05/24 0741  HGBA1C 4.6*    CBG: Recent Labs  Lab 03/06/24 1615 03/06/24 2120 03/07/24 0040 03/07/24 0439 03/07/24 0800  GLUCAP 131* 110* 105* 95 90   Lipid Profile: No results for input(s): CHOL, HDL, LDLCALC, TRIG, CHOLHDL, LDLDIRECT in the last 72 hours. Thyroid  Function Tests: Recent Labs    03/05/24 0741  TSH 2.910   Anemia Panel: Recent Labs    03/05/24 0741  VITAMINB12 830   Sepsis Labs: Recent Labs  Lab 03/04/24 1820  LATICACIDVEN 0.9    Recent Results (from the past 240 hours)  Blood culture (routine x 2)     Status: None (Preliminary result)   Collection Time: 03/04/24  6:20 PM   Specimen: BLOOD  Result Value Ref Range Status   Specimen Description   Final    BLOOD BLOOD LEFT ARM Performed at [facility], [address], [place], [place] [address]    Special Requests   Final    BOTTLES DRAWN AEROBIC ONLY Blood Culture results may not be optimal due to an inadequate volume of blood received in culture bottles Performed at [facility], [address] [place], [place] [address]    Culture   Final    NO GROWTH 3 DAYS Performed at [facility], [address], [place], [place] [address]    Report Status PENDING  Incomplete  MRSA Next Gen by PCR, Nasal     Status: None   Collection Time: 03/05/24  6:28 AM   Specimen: Nasal Mucosa; Nasal Swab  Result Value Ref Range Status   MRSA by PCR Next Gen NOT DETECTED NOT DETECTED Final    Comment: (NOTE) The GeneXpert MRSA Assay (FDA approved for NASAL specimens only), is one component of a comprehensive MRSA colonization surveillance program. It is not intended to diagnose MRSA infection nor to guide or monitor treatment for MRSA infections. Test performance is not FDA approved in patients less than 80 years old. Performed at [facility], [address] [place], [place] [address]   Blood culture (routine x 2)     Status: None (Preliminary result)    Collection Time: 03/05/24  7:41 AM   Specimen: BLOOD  Result Value Ref Range Status   Specimen Description   Final    BLOOD BLOOD RIGHT ARM Performed at [facility], [address], [place], [place] [address]    Special Requests   Final    BOTTLES DRAWN AEROBIC AND ANAEROBIC Blood Culture results may not be optimal due to an inadequate volume of blood received in culture bottles Performed  at [facility], [address], [place], [place] [address]    Culture   Final    NO GROWTH 2 DAYS Performed at [facility], [address], [place], [place] [address]    Report Status PENDING  Incomplete         Radiology Studies: No results found.       Scheduled Meds:  amLODipine   2.5 mg Oral Daily   busPIRone   10 mg Oral TID   citalopram   20 mg Oral Daily   divalproex   250 mg Oral BID   Continuous Infusions:     LOS: 2 days    Time spent: 45 minutes spent on 03/07/2024 caring for this patient face-to-face including chart review, ordering labs/tests, documenting, discussion with nursing staff, consultants, updating family and interview/physical exam    Camellia PARAS [place], DO [facility] Available via Epic secure chat 7am-7pm After these hours, please refer to coverage provider listed on [email or web address] 03/07/2024, 11:35 AM

## 2024-03-07 NOTE — Plan of Care (Signed)

## 2024-03-08 DIAGNOSIS — E162 Hypoglycemia, unspecified: Secondary | ICD-10-CM | POA: Diagnosis not present

## 2024-03-08 LAB — BASIC METABOLIC PANEL WITH GFR
Anion gap: 11 (ref 5–15)
BUN: <5 mg/dL — ABNORMAL LOW (ref 8–23)
CO2: 26 mmol/L (ref 22–32)
Calcium: 8.9 mg/dL (ref 8.9–10.3)
Chloride: 109 mmol/L (ref 98–111)
Creatinine, Ser: 0.92 mg/dL (ref 0.44–1.00)
GFR, Estimated: >60 mL/min (ref >60)
Glucose, Bld: 83 mg/dL (ref 70–99)
Potassium: 4.1 mmol/L (ref 3.5–5.1)
Sodium: 145 mmol/L (ref 135–145)

## 2024-03-08 LAB — GLUCOSE, CAPILLARY
Glucose-Capillary: 103 mg/dL — ABNORMAL HIGH (ref 70–99)
Glucose-Capillary: 113 mg/dL — ABNORMAL HIGH (ref 70–99)
Glucose-Capillary: 84 mg/dL (ref 70–99)
Glucose-Capillary: 95 mg/dL (ref 70–99)
Glucose-Capillary: 96 mg/dL (ref 70–99)

## 2024-03-08 LAB — MAGNESIUM: Magnesium: 1.8 mg/dL (ref 1.7–2.4)

## 2024-03-08 MED ORDER — LOPERAMIDE HCL 2 MG PO CAPS: 2.0000 mg | ORAL_CAPSULE | ORAL | Status: DC | PRN

## 2024-03-08 NOTE — Plan of Care (Signed)

## 2024-03-08 NOTE — Progress Notes (Signed)
 PROGRESS NOTE    [name] [name]  FMW:[id number] DOB: May 22, 1941 DOA: 03/04/2024 PCP: [name], DO    Brief Narrative:   [name] [name] is a 83 y.o. adult with past medical history significant for stage I adenocarcinoma left lung on radiation therapy, HTN, GAD/depression, DM2 (diet controlled) who presented to [facility] ED on 03/04/2024 from home with complaints of nausea and vomiting.  Patient reports 2 days of recurrent nausea resulting in 3-4 daily episodes of nonbloody, nonbilious emesis.  Additionally she reports generalized weakness which resulted in a ground-level mechanical fall day of ED arrival.  Denies striking her head.  Denies loss of consciousness.  Further denies fever, no chills, no myalgias, no cough/shortness of breath, no abdominal pain, no urinary symptoms.  In the ED, temperature 97.9 F, HR 80, RR 20, BP 161/77, SpO2 96% on room air.  WBC 3.3, hemoglobin 12.7, platelet count 103.  Sodium 141, potassium 3.6, chloride 111, CO2 16, glucose 41, BUN 11, creatinine 0.86.  AST 14, ALT less than 5, total bilirubin 0.4.  Lipase 23.  Lactic acid 0.9.  Magnesium  1.6.  Urinalysis unrevealing.  CT head without contrast with chronic atrophic and ischemic changes without acute abnormality.  CT chest/abdomen/pelvis with known left upper lobe mass slightly increased in size from prior now measuring 14 mm, stable left thyroid  nodule previously evaluated with biopsy, no acute abnormalities in the abdomen/pelvis.  Right hip/pelvis x-ray with marked severity degenerative changes, no fracture/dislocation noted.  Blood cultures x 2 collected.  Patient was given D50 x 1, Zofran  4 mg IV x 1, NS 1 L bolus.  Patient admitted for evaluation of generalized weakness, nausea/vomiting, hyperglycemia.  Assessment & Plan:   Nausea/vomiting, recurrent Patient presenting with recurrent nausea with nonbloody/nonbilious emesis over the last 2 days, limiting oral intake over that timeframe.  No evidence of acute  intra-abdominal or acute intrapelvic process on CT imaging, and no evidence of acute pancreatitis or bowel obstruction.  Suspect etiology secondary to her continued radiation treatment for lung cancer.  Initially treated with IV fluids, now stopped -- antiemetics, supportive care; encourage increased oral intake  Hypoglycemia Hx DM2 Etiology secondary to recurrent nausea and vomiting and poor oral intake.  History of diabetes but diet controlled at baseline (not on insulin oral hypoglycemic agents).  Hemoglobin A1c: 4.6%. -- Glucose now has been stable, discontinue every 4 hour glucose, monitor as needed  Diarrhea Patient with 3 episodes of diarrhea overnight.  Afebrile without leukocytosis.  GI PCR panel negative. -- Imodium  as needed  Migraine headache -- Toradol  15 mg IV q8h PRN -- Tylenol  as needed  Hypomagnesemia Etiology likely due to GI loss with nausea/vomiting as above.  Repleted.  Hypophosphatemia Repleted  Generalized weakness Etiology likely secondary to dehydration, poor oral intake.  Urinalysis inconsistent with UTI, CT chest/abdomen/pelvis relatively unrevealing.  CT head with no evidence of acute intracranial process.  TSH within normal limits. -- Continue therapy efforts while inpatient -- TOC for SNF placement, would benefit from placement given progressive weakness, debility, deconditioning with poor social support as she lives alone -- Fall precautions  Leukopenia:  WBC count 3300;  compared to most recent prior value of 6900 on 01/02/2024. While she has a history of stage I adenocarcinoma of the left upper lobe of lung, distant. She is on chemotherapy with no current evidence of contributory underlying infectious process identified at this time, although we will continue to monitor for evidence of gram-negative infection, including following for results of blood cultures x  2 collected this evening. Urinalysis showed no evidence of acute infection, no evidence of acute  infection identified on today CT chest, abdomen, pelvis.  -- Blood cultures x 2: no growth x 4 days  Stage I adenocarcinoma of the left upper lobe of the lung  Follows with medical oncology, Dr. Sherrod outpatient.  Currently on radiation treatment.  HTN -- Amlodipine  2.5 mg p.o. daily -- Hold home Lasix  for now  GAD/depression -- Celexa  20 mg p.o. daily -- BuSpar  10 mg p.o. 3 times daily -- Depakote  250 mg p.o. twice daily  Obesity, class I Body mass index is 31.57 kg/m.   DVT prophylaxis: SCDs Start: 03/05/24 0033    Code Status: Do not attempt resuscitation (DNR) PRE-ARREST INTERVENTIONS DESIRED Family Communication: No family present bedside this morning  Disposition Plan:  Level of care: Med-Surg Status is: Inpatient Remains inpatient appropriate because: Will need SNF placement, TOC consulted    Consultants:  None  Procedures:  None  Antimicrobials:  None   Subjective: Patient seen examined bedside, lying in bed.  Headache improved.  GI PCR panel negative, enteric precautions discontinued.  Patient continues with generalized weakness.  Discussed with patient given her poor functional status, poor social support and that she lives alone would benefit from rehab placement which she is in agreement.  TOC consulted.  Denies visual changes, no chest pain, no palpitations, no shortness of breath, no abdominal pain, no fever/chills/night sweats, no diarrhea, no focal weakness, no fatigue, no paresthesias.  No acute events overnight per nursing staff.  Objective: Vitals:   03/07/24 1420 03/07/24 2017 03/08/24 0500 03/08/24 0646  BP: 111/67 (!) 145/81  126/88  Pulse: (!) 105 89  83  Resp: 17 18  18   Temp: 97.8 F (36.6 C) 99.1 F (37.3 C)  98.8 F (37.1 C)  TempSrc: Oral Oral  Oral  SpO2:  97%  96%  Weight:   70.9 kg   Height:        Intake/Output Summary (Last 24 hours) at 03/08/2024 1107 Last data filed at 03/07/2024 2218 Gross per 24 hour  Intake 360 ml   Output --  Net 360 ml   Filed Weights   03/06/24 0500 03/07/24 0452 03/08/24 0500  Weight: 71 kg 70.3 kg 70.9 kg    Examination:  Physical Exam: GEN: NAD, alert and oriented x 3, elderly/chronically ill in appearance HEENT: NCAT, PERRL, EOMI, sclera clear, MMM PULM: CTAB w/o wheezes/crackles, normal respiratory effort, room air CV: RRR w/o M/G/R GI: abd soft, NTND, + BS MSK: no peripheral edema, moves all extremities independently NEURO: No focal neurological deficit PSYCH: normal mood/affect Integumentary: No concerning rashes/lesions/wounds noted on exposed skin surfaces    Data Reviewed: I have personally reviewed following labs and imaging studies  CBC: Recent Labs  Lab 03/04/24 1820 03/05/24 0741 03/06/24 0420 03/07/24 0426  WBC 3.3* 2.5* 3.5* 3.3*  NEUTROABS  --  1.4*  --   --   HGB 12.7 12.2 11.6* 12.3  HCT 37.1 35.2* 34.1* 37.1  MCV 103.9* 102.9* 103.6* 104.5*  PLT 103* 107* 102* 94*   Basic Metabolic Panel: Recent Labs  Lab 03/04/24 1820 03/04/24 2007 03/05/24 0741 03/06/24 0420 03/07/24 0426 03/08/24 0443  NA  --  141 138 141 143 145  K  --  3.6 3.4* 4.0 3.8 4.1  CL  --  111 106 109 109 109  CO2  --  16* 20* 22 24 26   GLUCOSE  --  41* 173* 95 84 83  BUN  --  11 9 <5* <5* <5*  CREATININE  --  0.86 1.03* 0.86 0.86 0.92  CALCIUM   --  7.4* 9.2 9.1 8.9 8.9  MG 1.6*  --  2.2 1.5* 1.9 1.8  PHOS  --   --   --  2.3* 2.6  --    GFR: Estimated Creatinine Clearance (by C-G formula based on SCr of 0.92 mg/dL) Female: 39 mL/min Female: 48.2 mL/min Liver Function Tests: Recent Labs  Lab 03/04/24 2007 03/05/24 0741  AST 14* 17  ALT <5 <5  ALKPHOS 40 51  BILITOT 0.4 0.6  PROT 4.9* 5.9*  ALBUMIN 3.4* 4.0   Recent Labs  Lab 03/04/24 2007  LIPASE 23   No results for input(s): AMMONIA in the last 168 hours. Coagulation Profile: No results for input(s): INR, PROTIME in the last 168 hours. Cardiac Enzymes: Recent Labs  Lab 03/05/24 0741   CKTOTAL 36*   BNP (last 3 results) No results for input(s): PROBNP in the last 8760 hours. HbA1C: No results for input(s): HGBA1C in the last 72 hours.  CBG: Recent Labs  Lab 03/07/24 1631 03/07/24 2018 03/08/24 0037 03/08/24 0358 03/08/24 0905  GLUCAP 89 86 95 84 96   Lipid Profile: No results for input(s): CHOL, HDL, LDLCALC, TRIG, CHOLHDL, LDLDIRECT in the last 72 hours. Thyroid  Function Tests: No results for input(s): TSH, T4TOTAL, FREET4, T3FREE, THYROIDAB in the last 72 hours.  Anemia Panel: No results for input(s): VITAMINB12, FOLATE, FERRITIN, TIBC, IRON , RETICCTPCT in the last 72 hours.  Sepsis Labs: Recent Labs  Lab 03/04/24 1820  LATICACIDVEN 0.9    Recent Results (from the past 240 hours)  Blood culture (routine x 2)     Status: None (Preliminary result)   Collection Time: 03/04/24  6:20 PM   Specimen: BLOOD  Result Value Ref Range Status   Specimen Description   Final    BLOOD BLOOD LEFT ARM Performed at [facility] [address] [place], [place] [address]    Special Requests   Final    BOTTLES DRAWN AEROBIC ONLY Blood Culture results may not be optimal due to an inadequate volume of blood received in culture bottles Performed at [facility], [address] [place], [place] [address]    Culture   Final    NO GROWTH 4 DAYS Performed at [facility], [address] [place], [place] [address]    Report Status PENDING  Incomplete  MRSA Next Gen by PCR, Nasal     Status: None   Collection Time: 03/05/24  6:28 AM   Specimen: Nasal Mucosa; Nasal Swab  Result Value Ref Range Status   MRSA by PCR Next Gen NOT DETECTED NOT DETECTED Final    Comment: (NOTE) The GeneXpert MRSA Assay (FDA approved for NASAL specimens only), is one component of a comprehensive MRSA colonization surveillance program. It is not intended to diagnose MRSA infection nor to guide or monitor  treatment for MRSA infections. Test performance is not FDA approved in patients less than 18 years old. Performed at [facility], [address], [place], [place] [address]   Blood culture (routine x 2)     Status: None (Preliminary result)   Collection Time: 03/05/24  7:41 AM   Specimen: BLOOD  Result Value Ref Range Status   Specimen Description   Final    BLOOD BLOOD RIGHT ARM Performed at [facility], [address], [place], [place] [address]  Special Requests   Final    BOTTLES DRAWN AEROBIC AND ANAEROBIC Blood Culture results may not be optimal due to an inadequate volume of blood received in culture bottles Performed at [facility], [address] [place], [place] [address]    Culture   Final    NO GROWTH 3 DAYS Performed at [facility], [address] [place], [place] [address]    Report Status PENDING  Incomplete  Gastrointestinal Panel by PCR , Stool     Status: None   Collection Time: 03/06/24  1:56 PM   Specimen: Stool  Result Value Ref Range Status   Campylobacter species NOT DETECTED NOT DETECTED Final   Plesimonas shigelloides NOT DETECTED NOT DETECTED Final   Salmonella species NOT DETECTED NOT DETECTED Final   Yersinia enterocolitica NOT DETECTED NOT DETECTED Final   Vibrio species NOT DETECTED NOT DETECTED Final   Vibrio cholerae NOT DETECTED NOT DETECTED Final   Enteroaggregative E coli (EAEC) NOT DETECTED NOT DETECTED Final   Enteropathogenic E coli (EPEC) NOT DETECTED NOT DETECTED Final   Enterotoxigenic E coli (ETEC) NOT DETECTED NOT DETECTED Final   Shiga like toxin producing E coli (STEC) NOT DETECTED NOT DETECTED Final   Shigella/Enteroinvasive E coli (EIEC) NOT DETECTED NOT DETECTED Final   Cryptosporidium NOT DETECTED NOT DETECTED Final   Cyclospora cayetanensis NOT DETECTED NOT DETECTED Final   Entamoeba histolytica NOT DETECTED NOT DETECTED Final   Giardia lamblia NOT DETECTED NOT DETECTED  Final   Adenovirus F40/41 NOT DETECTED NOT DETECTED Final   Astrovirus NOT DETECTED NOT DETECTED Final   Norovirus GI/GII NOT DETECTED NOT DETECTED Final   Rotavirus A NOT DETECTED NOT DETECTED Final   Sapovirus (I, II, IV, and V) NOT DETECTED NOT DETECTED Final    Comment: Performed at [facility], [address] [place], [place] [address]         Radiology Studies: No results found.       Scheduled Meds:  amLODipine   2.5 mg Oral Daily   busPIRone   10 mg Oral TID   citalopram   20 mg Oral Daily   divalproex   250 mg Oral BID   Continuous Infusions:     LOS: 3 days    Time spent: 45 minutes spent on 03/08/2024 caring for this patient face-to-face including chart review, ordering labs/tests, documenting, discussion with nursing staff, consultants, updating family and interview/physical exam    Camellia PARAS [place], DO [facility] Available via Epic secure chat 7am-7pm After these hours, please refer to coverage provider listed on [email or web address] 03/08/2024, 11:07 AM

## 2024-03-08 NOTE — Progress Notes (Signed)
 Mobility Specialist - Progress Note   03/08/24 1201  Mobility  Activity Pivoted/transferred from bed to chair;Ambulated with assistance  Level of Assistance Contact guard assist, steadying assist  Assistive Device Front wheel walker  Distance Ambulated (ft) 3 ft  Range of Motion/Exercises Active  Activity Response Tolerated well  Mobility Referral Yes  Mobility visit 1 Mobility  Mobility Specialist Start Time (ACUTE ONLY) 1150  Mobility Specialist Stop Time (ACUTE ONLY) 1201  Mobility Specialist Time Calculation (min) (ACUTE ONLY) 11 min   Pt was found in bed and agreeable to mobilize. Was left on recliner chair with all needs met. Call bell in reach and chair alarm on.   Erminio Leos,  Mobility Specialist Can be reached via Secure Chat

## 2024-03-09 DIAGNOSIS — E162 Hypoglycemia, unspecified: Secondary | ICD-10-CM | POA: Diagnosis not present

## 2024-03-09 LAB — CULTURE, BLOOD (ROUTINE X 2): Culture: NO GROWTH

## 2024-03-09 MED ORDER — LORAZEPAM 0.5 MG PO TABS
0.5000 mg | ORAL_TABLET | Freq: Four times a day (QID) | ORAL | Status: DC | PRN
  Administered 2024-03-09 – 2024-03-10 (×3): 0.5 mg via ORAL
  Filled 2024-03-09 (×3): qty 1

## 2024-03-09 NOTE — Plan of Care (Incomplete)

## 2024-03-09 NOTE — Progress Notes (Signed)
 PROGRESS NOTE    [name] [name]  FMW:[id number] DOB: 1941/05/27 DOA: 03/04/2024 PCP: [name], DO    Brief Narrative:   [name] [name] is a 83 y.o. adult with past medical history significant for stage I adenocarcinoma left lung on radiation therapy, HTN, GAD/depression, DM2 (diet controlled) who presented to [facility] ED on 03/04/2024 from home with complaints of nausea and vomiting.  Patient reports 2 days of recurrent nausea resulting in 3-4 daily episodes of nonbloody, nonbilious emesis.  Additionally she reports generalized weakness which resulted in a ground-level mechanical fall day of ED arrival.  Denies striking her head.  Denies loss of consciousness.  Further denies fever, no chills, no myalgias, no cough/shortness of breath, no abdominal pain, no urinary symptoms.  In the ED, temperature 97.9 F, HR 80, RR 20, BP 161/77, SpO2 96% on room air.  WBC 3.3, hemoglobin 12.7, platelet count 103.  Sodium 141, potassium 3.6, chloride 111, CO2 16, glucose 41, BUN 11, creatinine 0.86.  AST 14, ALT less than 5, total bilirubin 0.4.  Lipase 23.  Lactic acid 0.9.  Magnesium  1.6.  Urinalysis unrevealing.  CT head without contrast with chronic atrophic and ischemic changes without acute abnormality.  CT chest/abdomen/pelvis with known left upper lobe mass slightly increased in size from prior now measuring 14 mm, stable left thyroid  nodule previously evaluated with biopsy, no acute abnormalities in the abdomen/pelvis.  Right hip/pelvis x-ray with marked severity degenerative changes, no fracture/dislocation noted.  Blood cultures x 2 collected.  Patient was given D50 x 1, Zofran  4 mg IV x 1, NS 1 L bolus.  Patient admitted for evaluation of generalized weakness, nausea/vomiting, hyperglycemia.  Assessment & Plan:   Nausea/vomiting, recurrent Patient presenting with recurrent nausea with nonbloody/nonbilious emesis over the last 2 days, limiting oral intake over that timeframe.  No evidence of acute  intra-abdominal or acute intrapelvic process on CT imaging, and no evidence of acute pancreatitis or bowel obstruction.  Suspect etiology secondary to her continued radiation treatment for lung cancer.  Initially treated with IV fluids, now stopped -- antiemetics, supportive care; encourage increased oral intake  Hypoglycemia Hx DM2 Etiology secondary to recurrent nausea and vomiting and poor oral intake.  History of diabetes but diet controlled at baseline (not on insulin oral hypoglycemic agents).  Hemoglobin A1c: 4.6%. -- Glucose now has been stable, discontinue every 4 hour glucose, monitor as needed  Diarrhea Patient with 3 episodes of diarrhea overnight.  Afebrile without leukocytosis.  GI PCR panel negative. -- Imodium  as needed  Migraine headache -- Toradol  15 mg IV q8h PRN -- Tylenol  as needed  Hypomagnesemia Etiology likely due to GI loss with nausea/vomiting as above.  Repleted.  Hypophosphatemia Repleted  Generalized weakness Etiology likely secondary to dehydration, poor oral intake.  Urinalysis inconsistent with UTI, CT chest/abdomen/pelvis relatively unrevealing.  CT head with no evidence of acute intracranial process.  TSH within normal limits. -- Continue therapy efforts while inpatient -- TOC for SNF placement, would benefit from placement given progressive weakness, debility, deconditioning with poor social support as she lives alone -- Fall precautions  Leukopenia:  WBC count 3300;  compared to most recent prior value of 6900 on 01/02/2024. While she has a history of stage I adenocarcinoma of the left upper lobe of lung, distant. She is on chemotherapy with no current evidence of contributory underlying infectious process identified at this time, although we will continue to monitor for evidence of gram-negative infection, including following for results of blood cultures x  2 collected this evening. Urinalysis showed no evidence of acute infection, no evidence of acute  infection identified on today CT chest, abdomen, pelvis.  -- Blood cultures x 2: no growth x 4 days  Stage I adenocarcinoma of the left upper lobe of the lung  Follows with medical oncology, Dr. Sherrod outpatient.  Currently on radiation treatment.  HTN -- Amlodipine  2.5 mg p.o. daily -- Hold home Lasix  for now  GAD/depression -- Celexa  20 mg p.o. daily -- BuSpar  10 mg p.o. 3 times daily -- Depakote  250 mg p.o. twice daily  Obesity, class I Body mass index is 31.57 kg/m.   DVT prophylaxis: SCDs Start: 03/05/24 0033    Code Status: Do not attempt resuscitation (DNR) PRE-ARREST INTERVENTIONS DESIRED Family Communication: No family present bedside this morning  Disposition Plan:  Level of care: Med-Surg Status is: Inpatient Remains inpatient appropriate because: Will need SNF placement, TOC consulted    Consultants:  None  Procedures:  None  Antimicrobials:  None   Subjective: Patient seen examined bedside, lying in bed.  Headache remains improved/resolved, utilizing Tylenol .  Seen by her grandson yesterday. Patient continues with generalized weakness.  Discussed with patient given her poor functional status, poor social support and that she lives alone would benefit from rehab placement which she is in agreement.  TOC consulted.  Denies visual changes, no chest pain, no palpitations, no shortness of breath, no abdominal pain, no fever/chills/night sweats, no diarrhea, no focal weakness, no fatigue, no paresthesias.  No acute events overnight per nursing staff.  Objective: Vitals:   03/08/24 0646 03/08/24 1325 03/08/24 2138 03/09/24 0519  BP: 126/88 115/83 126/66 126/66  Pulse: 83 (!) 105 86 73  Resp: 18 17 16 17   Temp: 98.8 F (37.1 C) (!) 97.4 F (36.3 C) 98.2 F (36.8 C) 97.7 F (36.5 C)  TempSrc: Oral Oral Oral Oral  SpO2: 96% 98% 93% 94%  Weight:      Height:        Intake/Output Summary (Last 24 hours) at 03/09/2024 1158 Last data filed at 03/09/2024  1058 Gross per 24 hour  Intake 600 ml  Output --  Net 600 ml   Filed Weights   03/06/24 0500 03/07/24 0452 03/08/24 0500  Weight: 71 kg 70.3 kg 70.9 kg    Examination:  Physical Exam: GEN: NAD, alert and oriented x 3, elderly/chronically ill in appearance HEENT: NCAT, PERRL, EOMI, sclera clear, MMM PULM: CTAB w/o wheezes/crackles, normal respiratory effort, room air CV: RRR w/o M/G/R GI: abd soft, NTND, + BS MSK: no peripheral edema, moves all extremities independently NEURO: No focal neurological deficit PSYCH: normal mood/affect Integumentary: No concerning rashes/lesions/wounds noted on exposed skin surfaces    Data Reviewed: I have personally reviewed following labs and imaging studies  CBC: Recent Labs  Lab 03/04/24 1820 03/05/24 0741 03/06/24 0420 03/07/24 0426  WBC 3.3* 2.5* 3.5* 3.3*  NEUTROABS  --  1.4*  --   --   HGB 12.7 12.2 11.6* 12.3  HCT 37.1 35.2* 34.1* 37.1  MCV 103.9* 102.9* 103.6* 104.5*  PLT 103* 107* 102* 94*   Basic Metabolic Panel: Recent Labs  Lab 03/04/24 1820 03/04/24 2007 03/05/24 0741 03/06/24 0420 03/07/24 0426 03/08/24 0443  NA  --  141 138 141 143 145  K  --  3.6 3.4* 4.0 3.8 4.1  CL  --  111 106 109 109 109  CO2  --  16* 20* 22 24 26   GLUCOSE  --  41* 173* 95  84 83  BUN  --  11 9 <5* <5* <5*  CREATININE  --  0.86 1.03* 0.86 0.86 0.92  CALCIUM   --  7.4* 9.2 9.1 8.9 8.9  MG 1.6*  --  2.2 1.5* 1.9 1.8  PHOS  --   --   --  2.3* 2.6  --    GFR: Estimated Creatinine Clearance (by C-G formula based on SCr of 0.92 mg/dL) Female: 39 mL/min Female: 48.2 mL/min Liver Function Tests: Recent Labs  Lab 03/04/24 2007 03/05/24 0741  AST 14* 17  ALT <5 <5  ALKPHOS 40 51  BILITOT 0.4 0.6  PROT 4.9* 5.9*  ALBUMIN 3.4* 4.0   Recent Labs  Lab 03/04/24 2007  LIPASE 23   No results for input(s): AMMONIA in the last 168 hours. Coagulation Profile: No results for input(s): INR, PROTIME in the last 168 hours. Cardiac  Enzymes: Recent Labs  Lab 03/05/24 0741  CKTOTAL 36*   BNP (last 3 results) No results for input(s): PROBNP in the last 8760 hours. HbA1C: No results for input(s): HGBA1C in the last 72 hours.  CBG: Recent Labs  Lab 03/08/24 0037 03/08/24 0358 03/08/24 0905 03/08/24 1218 03/08/24 1744  GLUCAP 95 84 96 113* 103*   Lipid Profile: No results for input(s): CHOL, HDL, LDLCALC, TRIG, CHOLHDL, LDLDIRECT in the last 72 hours. Thyroid  Function Tests: No results for input(s): TSH, T4TOTAL, FREET4, T3FREE, THYROIDAB in the last 72 hours.  Anemia Panel: No results for input(s): VITAMINB12, FOLATE, FERRITIN, TIBC, IRON , RETICCTPCT in the last 72 hours.  Sepsis Labs: Recent Labs  Lab 03/04/24 1820  LATICACIDVEN 0.9    Recent Results (from the past 240 hours)  Blood culture (routine x 2)     Status: None   Collection Time: 03/04/24  6:20 PM   Specimen: BLOOD  Result Value Ref Range Status   Specimen Description   Final    BLOOD BLOOD LEFT ARM Performed at [facility], [address], [place], [place] [address]    Special Requests   Final    BOTTLES DRAWN AEROBIC ONLY Blood Culture results may not be optimal due to an inadequate volume of blood received in culture bottles Performed at [facility], [address], [place], [place] [address]    Culture   Final    NO GROWTH 5 DAYS Performed at [facility], [address] [place], [place] [address]    Report Status 03/09/2024 FINAL  Final  MRSA Next Gen by PCR, Nasal     Status: None   Collection Time: 03/05/24  6:28 AM   Specimen: Nasal Mucosa; Nasal Swab  Result Value Ref Range Status   MRSA by PCR Next Gen NOT DETECTED NOT DETECTED Final    Comment: (NOTE) The GeneXpert MRSA Assay (FDA approved for NASAL specimens only), is one component of a comprehensive MRSA colonization surveillance program. It is not intended to diagnose MRSA  infection nor to guide or monitor treatment for MRSA infections. Test performance is not FDA approved in patients less than 65 years old. Performed at [facility], [address], [place], [place] [address]   Blood culture (routine x 2)     Status: None (Preliminary result)   Collection Time: 03/05/24  7:41 AM   Specimen: BLOOD  Result Value Ref Range Status   Specimen Description   Final    BLOOD BLOOD RIGHT ARM Performed at [facility], [address] [place],  [place] [address]    Special Requests   Final    BOTTLES DRAWN AEROBIC AND ANAEROBIC Blood Culture results may not be optimal due to an inadequate volume of blood received in culture bottles Performed at [facility], [address] [place], [place] [address]    Culture   Final    NO GROWTH 4 DAYS Performed at [facility], [address] [place], [place] [address]    Report Status PENDING  Incomplete  Gastrointestinal Panel by PCR , Stool     Status: None   Collection Time: 03/06/24  1:56 PM   Specimen: Stool  Result Value Ref Range Status   Campylobacter species NOT DETECTED NOT DETECTED Final   Plesimonas shigelloides NOT DETECTED NOT DETECTED Final   Salmonella species NOT DETECTED NOT DETECTED Final   Yersinia enterocolitica NOT DETECTED NOT DETECTED Final   Vibrio species NOT DETECTED NOT DETECTED Final   Vibrio cholerae NOT DETECTED NOT DETECTED Final   Enteroaggregative E coli (EAEC) NOT DETECTED NOT DETECTED Final   Enteropathogenic E coli (EPEC) NOT DETECTED NOT DETECTED Final   Enterotoxigenic E coli (ETEC) NOT DETECTED NOT DETECTED Final   Shiga like toxin producing E coli (STEC) NOT DETECTED NOT DETECTED Final   Shigella/Enteroinvasive E coli (EIEC) NOT DETECTED NOT DETECTED Final   Cryptosporidium NOT DETECTED NOT DETECTED Final   Cyclospora cayetanensis NOT DETECTED NOT DETECTED Final   Entamoeba histolytica NOT DETECTED NOT DETECTED Final   Giardia  lamblia NOT DETECTED NOT DETECTED Final   Adenovirus F40/41 NOT DETECTED NOT DETECTED Final   Astrovirus NOT DETECTED NOT DETECTED Final   Norovirus GI/GII NOT DETECTED NOT DETECTED Final   Rotavirus A NOT DETECTED NOT DETECTED Final   Sapovirus (I, II, IV, and V) NOT DETECTED NOT DETECTED Final    Comment: Performed at [facility], [address] [place], [place] [address]         Radiology Studies: No results found.       Scheduled Meds:  amLODipine   2.5 mg Oral Daily   busPIRone   10 mg Oral TID   citalopram   20 mg Oral Daily   divalproex   250 mg Oral BID   Continuous Infusions:     LOS: 4 days    Time spent: 45 minutes spent on 03/09/2024 caring for this patient face-to-face including chart review, ordering labs/tests, documenting, discussion with nursing staff, consultants, updating family and interview/physical exam    Camellia PARAS [place], DO [facility] Available via Epic secure chat 7am-7pm After these hours, please refer to coverage provider listed on [email or web address] 03/09/2024, 11:58 AM

## 2024-03-09 NOTE — TOC Initial Note (Signed)
 Transition of Care [facility]) - Initial/Assessment Note    Patient Details  Name: [name] [name] MRN: [id number] Date of Birth: July 12, 1940  [facility] CM/SW Contact:    Tawni CHRISTELLA Eva, LCSW Phone Number: 03/09/2024, 10:58 AM  Clinical Narrative:                 CSW spoke with pt's Grandson to discuss recommendation for home health services. He has spoke with pt and are both agreeable to services, the pt does not have a preferences in [facility] agency. Pt's Grandson stated pt has all needed DME.   [facility] accepted pt for HHPT/OT services. IP care management to follow.    Expected Discharge Plan: Home w Home Health Services Barriers to Discharge: Continued Medical Work up   Patient Goals and CMS Choice Patient states their goals for this hospitalization and ongoing recovery are:: retrun home with home health CMS Medicare.gov Compare Post Acute Care list provided to:: Patient Choice offered to / list presented to : Patient, Adult Children      Expected Discharge Plan and Services       Living arrangements for the past 2 months: Single Family Home                           HH Arranged: PT, OT   Date HH Agency Contacted: 03/09/24 Time HH Agency Contacted: 1045    Prior Living Arrangements/Services Living arrangements for the past 2 months: Single Family Home Lives with:: Self Patient language and need for interpreter reviewed:: Yes Do you feel safe going back to the place where you live?: Yes      Need for Family Participation in Patient Care: No (Comment) Care giver support system in place?: No (comment)   Criminal Activity/Legal Involvement Pertinent to Current Situation/Hospitalization: No - Comment as needed  Activities of Daily Living   ADL Screening (condition at time of admission) Independently performs ADLs?: No Does the patient have a NEW difficulty with bathing/dressing/toileting/self-feeding that is expected to last >3 days?: Yes (Initiates  electronic notice to provider for possible OT consult) Does the patient have a NEW difficulty with getting in/out of bed, walking, or climbing stairs that is expected to last >3 days?: Yes (Initiates electronic notice to provider for possible PT consult) Does the patient have a NEW difficulty with communication that is expected to last >3 days?: No Is the patient deaf or have difficulty hearing?: Yes Does the patient have difficulty seeing, even when wearing glasses/contacts?: No Does the patient have difficulty concentrating, remembering, or making decisions?: No  Permission Sought/Granted                  Emotional Assessment Appearance:: Appears stated age Attitude/Demeanor/Rapport: Gracious Affect (typically observed): Accepting Orientation: : Oriented to Self, Oriented to Place, Oriented to  Time, Oriented to Situation   Psych Involvement: No (comment)  Admission diagnosis:  Hypoglycemia [E16.2] Intractable nausea and vomiting [R11.2] Patient Active Problem List   Diagnosis Date Noted   Hypoglycemia 03/05/2024   Generalized weakness 03/05/2024   Hypomagnesemia 03/05/2024   Leukopenia 03/05/2024   Lung cancer (HCC) 03/05/2024   GAD (generalized anxiety disorder) 03/05/2024   Depression 03/05/2024   Primary malignant neoplasm of bronchus of left upper lobe (HCC) 12/26/2023   Lung nodule 12/12/2023   DNR (do not resuscitate) 11/21/2020   Thrombocytopenia (HCC) 09/02/2020   Recurrent falls 09/07/2019   Dyslipidemia 09/06/2019   Tremor 09/06/2019   Fall  at home, initial encounter 09/06/2019   Psychosocial stressors 04/03/2018   Congenital absence of one kidney 09/05/2017   Anemia in chronic kidney disease 09/24/2016   CKD stage 3b, GFR 30-44 ml/min (HCC) 09/24/2016   Peripheral vascular disease (HCC)    Failure to thrive in adult 10/18/2015   Venous stasis dermatitis of both lower extremities 01/18/2015   Radiculopathy of lumbar region 01/06/2015   Seborrheic  keratosis 11/24/2014   Nausea & vomiting    OSA (obstructive sleep apnea) 09/21/2014   Osteoporosis 06/29/2014   Thyroid  nodule 06/29/2014   Breast calcifications 06/29/2014   Primary open angle glaucoma (POAG) of both eyes, severe stage 01/08/2014   Basal cell carcinoma of scalp 11/12/2013   Presbyopia 10/22/2013   GERD (gastroesophageal reflux disease) 09/11/2013   Major depressive disorder, recurrent episode, severe (HCC) 05/14/2013   Pulmonary nodules 05/24/2012   Carotid bruit 04/30/2012   Essential hypertension 04/27/2012   Anxiety 04/27/2012   Cervical spondylosis with radiculopathy 04/19/2012   PCP:  [name], DO Pharmacy:   [facility] [id number] - [place], [place] - [address] [place] [address] Phone: [phone] Fax: [phone]  [facility] - [name] [address] [place] [address] Phone: [phone] Fax: [phone]     Social Drivers of Health (SDOH) Social History: SDOH Screenings   Food Insecurity: Food Insecurity Present (03/05/2024)  Housing: High Risk (03/05/2024)  Transportation Needs: No Transportation Needs (03/05/2024)  Recent Concern: Transportation Needs - Unmet Transportation Needs (12/26/2023)  Utilities: Not At Risk (03/05/2024)  Alcohol  Screen: Low Risk  (10/13/2022)  Depression (PHQ2-9): High Risk (12/31/2023)  Financial Resource Strain: Low Risk  (10/13/2022)  Physical Activity: Insufficiently Active (10/13/2022)  Social Connections: Moderately Integrated (03/05/2024)  Stress: Stress Concern Present (10/13/2022)  Tobacco Use: Medium Risk (03/05/2024)   SDOH Interventions:     Readmission Risk Interventions    06/08/2023    1:37 PM 07/26/2022   12:20 PM 05/09/2022    3:18 PM  Readmission Risk Prevention Plan  Post Dischage Appt  Complete Complete  Medication Screening  Complete Complete  Transportation Screening Complete Complete   PCP or Specialist  Appt within 5-7 Days Complete    Home Care Screening Complete    Medication Review (RN CM) Complete

## 2024-03-10 DIAGNOSIS — E162 Hypoglycemia, unspecified: Secondary | ICD-10-CM | POA: Diagnosis not present

## 2024-03-10 LAB — CULTURE, BLOOD (ROUTINE X 2): Culture: NO GROWTH

## 2024-03-10 LAB — GLUCOSE, CAPILLARY: Glucose-Capillary: 81 mg/dL (ref 70–99)

## 2024-03-10 MED ORDER — CALCIUM CARBONATE ANTACID 500 MG PO CHEW: 1.0000 | CHEWABLE_TABLET | Freq: Three times a day (TID) | ORAL | Status: DC | PRN

## 2024-03-10 MED ORDER — KETOROLAC TROMETHAMINE 15 MG/ML IJ SOLN
15.0000 mg | Freq: Three times a day (TID) | INTRAMUSCULAR | Status: DC | PRN
  Administered 2024-03-10: 15 mg via INTRAVENOUS
  Filled 2024-03-10: qty 1

## 2024-03-10 NOTE — Plan of Care (Signed)

## 2024-03-10 NOTE — Progress Notes (Signed)
 Physical Therapy Treatment Patient Details Name: [name] [name] MRN: [id number] DOB: 01/19/1941 Today's Date: 03/10/2024   History of Present Illness 83 y.o. who is admitted to [facility] on 03/04/2024 with hypoglycemia after presenting from home to [facility] ED complaining of nausea vomiting, suspected etiology secondary to her continued radiation treatment for lung cancer. Pt with mechanical fall 03/04/24 xray and CT negative. PMH: stage Ia adenocarcinoma of the left lung, undergoing radiation therapy, hypertension, GAD, depression,    PT Comments   Pt admitted with above diagnosis.  Pt currently with functional limitations due to the deficits listed below (see PT Problem List). Pt seated in recliner when PT arrived. Pt reported she was in the shower prior to PT arrival. Pt able to relay information pertaining to home setting and indicated regardless of support system pt was wanting to go home vs d/c to SNF for rehab. Pt states the treatments wipe her out and her great granddaughter was providing some assist prior to hospitalization however may not be able to continue. Pt states having HH services in the past. Pt is S for sit to stand  from recliner, CGA and quickly progressing to S with use of RW for navigation in hallway 80 feet  with min cues and one standing therapeutic rest break with pt reporting SOB with O2 saturation 99% upon return to recliner. Pt left seated in recliner, all needs in place and nurse tech present. Pt will benefit from acute skilled PT to increase their independence and safety with mobility to allow discharge.      If plan is discharge home, recommend the following: Assistance with cooking/housework;Assist for transportation;Help with stairs or ramp for entrance   Can travel by private vehicle        Equipment Recommendations  None recommended by PT    Recommendations for Other Services       Precautions / Restrictions Precautions Precautions: Fall Recall of  Precautions/Restrictions: Intact Restrictions Weight Bearing Restrictions Per Provider Order: No     Mobility  Bed Mobility               General bed mobility comments: pt seated in recliner when PT arrived    Transfers Overall transfer level: Needs assistance Equipment used: Rolling walker (2 wheels) Transfers: Sit to/from Stand Sit to Stand: Supervision           General transfer comment: pt able to push to stand to RW with min cues no overt LOB    Ambulation/Gait Ambulation/Gait assistance: Supervision Gait Distance (Feet): 80 Feet Assistive device: Rolling walker (2 wheels) Gait Pattern/deviations: Step-through pattern, Trunk flexed Gait velocity: decreased     General Gait Details: min cues for safety, posutre, RW mangement and coordinated breathing with pt requiring one therapeutic rest break with reports of SOB   Stairs             Wheelchair Mobility     Tilt Bed    Modified Rankin (Stroke Patients Only)       Balance Overall balance assessment: Needs assistance Sitting-balance support: Feet supported Sitting balance-Leahy Scale: Good     Standing balance support: Bilateral upper extremity supported, No upper extremity supported, During functional activity Standing balance-Leahy Scale: Fair Standing balance comment: static standing no UE support B UE support at [facility] for dynamic activities                            Communication Communication Communication: No  apparent difficulties  Cognition Arousal: Alert Behavior During Therapy: WFL for tasks assessed/performed   PT - Cognitive impairments: No apparent impairments                         Following commands: Intact      Cueing Cueing Techniques: Verbal cues  Exercises      General Comments        Pertinent Vitals/Pain Pain Assessment Pain Location: migraine this am and feeling better    Home Living                          Prior  Function            PT Goals (current goals can now be found in the care plan section) Acute Rehab PT Goals PT Goal Formulation: With patient Time For Goal Achievement: 03/20/24 Potential to Achieve Goals: Good Progress towards PT goals: Progressing toward goals    Frequency    Min 3X/week      PT Plan      Co-evaluation              AM-PAC PT 6 Clicks Mobility   Outcome Measure  Help needed turning from your back to your side while in a flat bed without using bedrails?: A Little Help needed moving from lying on your back to sitting on the side of a flat bed without using bedrails?: A Little Help needed moving to and from a bed to a chair (including a wheelchair)?: A Little Help needed standing up from a chair using your arms (e.g., wheelchair or bedside chair)?: A Little Help needed to walk in hospital room?: A Little Help needed climbing 3-5 steps with a railing? : A Lot 6 Click Score: 17    End of Session Equipment Utilized During Treatment: Gait belt Activity Tolerance: Patient tolerated treatment well Patient left: in chair;with call bell/phone within reach Nurse Communication: Mobility status PT Visit Diagnosis: Difficulty in walking, not elsewhere classified (R26.2)     Time: 8562-8490 PT Time Calculation (min) (ACUTE ONLY): 32 min  Charges:    $Gait Training: 8-22 mins $Therapeutic Activity: 8-22 mins PT General Charges $$ ACUTE PT VISIT: 1 Visit                     [place], PT Acute Rehab    [place] VEAR Drone 03/10/2024, 5:33 PM

## 2024-03-10 NOTE — Progress Notes (Signed)
 PROGRESS NOTE    [name] [name]  FMW:[id number] DOB: [birth year] DOA: 03/04/2024 PCP: [name], DO    Brief Narrative:   [name] [name] is a 83 y.o. adult with past medical history significant for stage I adenocarcinoma left lung on radiation therapy, HTN, GAD/depression, DM2 (diet controlled) who presented to [facility] ED on 03/04/2024 from home with complaints of nausea and vomiting.  Patient reports 2 days of recurrent nausea resulting in 3-4 daily episodes of nonbloody, nonbilious emesis.  Additionally she reports generalized weakness which resulted in a ground-level mechanical fall day of ED arrival.  Denies striking her head.  Denies loss of consciousness.  Further denies fever, no chills, no myalgias, no cough/shortness of breath, no abdominal pain, no urinary symptoms.  In the ED, temperature 97.9 F, HR 80, RR 20, BP 161/77, SpO2 96% on room air.  WBC 3.3, hemoglobin 12.7, platelet count 103.  Sodium 141, potassium 3.6, chloride 111, CO2 16, glucose 41, BUN 11, creatinine 0.86.  AST 14, ALT less than 5, total bilirubin 0.4.  Lipase 23.  Lactic acid 0.9.  Magnesium  1.6.  Urinalysis unrevealing.  CT head without contrast with chronic atrophic and ischemic changes without acute abnormality.  CT chest/abdomen/pelvis with known left upper lobe mass slightly increased in size from prior now measuring 14 mm, stable left thyroid  nodule previously evaluated with biopsy, no acute abnormalities in the abdomen/pelvis.  Right hip/pelvis x-ray with marked severity degenerative changes, no fracture/dislocation noted.  Blood cultures x 2 collected.  Patient was given D50 x 1, Zofran  4 mg IV x 1, NS 1 L bolus.  Patient admitted for evaluation of generalized weakness, nausea/vomiting, hyperglycemia.  Assessment & Plan:   Nausea/vomiting, recurrent Patient presenting with recurrent nausea with nonbloody/nonbilious emesis over the last 2 days, limiting oral intake over that timeframe.  No evidence of acute  intra-abdominal or acute intrapelvic process on CT imaging, and no evidence of acute pancreatitis or bowel obstruction.  Suspect etiology secondary to her continued radiation treatment for lung cancer.  Initially treated with IV fluids, now stopped -- antiemetics, supportive care; encourage increased oral intake  Hypoglycemia Hx DM2 Etiology secondary to recurrent nausea and vomiting and poor oral intake.  History of diabetes but diet controlled at baseline (not on insulin oral hypoglycemic agents).  Hemoglobin A1c: 4.6%. -- Glucose now has been stable, discontinue every 4 hour glucose, monitor as needed  Diarrhea Afebrile without leukocytosis.  GI PCR panel negative. -- Imodium  as needed  Migraine headache -- Toradol  15 mg IV q8h PRN -- Tylenol  as needed  Hypomagnesemia Etiology likely due to GI loss with nausea/vomiting as above.  Repleted.  Hypophosphatemia Repleted  Generalized weakness Etiology likely secondary to dehydration, poor oral intake.  Urinalysis inconsistent with UTI, CT chest/abdomen/pelvis relatively unrevealing.  CT head with no evidence of acute intracranial process.  TSH within normal limits. -- Continue therapy efforts while inpatient -- TOC for SNF placement, would benefit from placement given progressive weakness, debility, deconditioning with poor social support as she lives alone -- Fall precautions  Leukopenia:  WBC count 3300;  compared to most recent prior value of 6900 on 01/02/2024. While she has a history of stage I adenocarcinoma of the left upper lobe of lung, distant. She is on chemotherapy with no current evidence of contributory underlying infectious process identified at this time, although we will continue to monitor for evidence of gram-negative infection, including following for results of blood cultures x 2 collected this evening. Urinalysis showed no evidence  of acute infection, no evidence of acute infection identified on today CT chest,  abdomen, pelvis.  -- Blood cultures x 2: no growth x 5 days  Stage I adenocarcinoma of the left upper lobe of the lung  Follows with medical oncology, Dr. Sherrod outpatient.  Currently on radiation treatment.  HTN -- Amlodipine  2.5 mg p.o. daily -- Hold home Lasix  for now  GAD/depression -- Celexa  20 mg p.o. daily -- BuSpar  10 mg p.o. 3 times daily -- Depakote  250 mg p.o. twice daily  Obesity, class I Body mass index is 30.9 kg/m.   DVT prophylaxis: SCDs Start: 03/05/24 0033    Code Status: Do not attempt resuscitation (DNR) PRE-ARREST INTERVENTIONS DESIRED Family Communication: No family present bedside this morning  Disposition Plan:  Level of care: Med-Surg Status is: Inpatient Remains inpatient appropriate because: Will need SNF placement, TOC consulted    Consultants:  None  Procedures:  None  Antimicrobials:  None   Subjective: Patient seen examined bedside, lying in bed.  Continues with intermittent headaches and generalized weakness.  Discussed with patient given her poor functional status, poor social support and that she lives alone would benefit from rehab placement which she is in agreement; awaiting reevaluation by therapy today.  TOC consulted.  Denies visual changes, no chest pain, no palpitations, no shortness of breath, no abdominal pain, no fever/chills/night sweats, no diarrhea, no focal weakness, no fatigue, no paresthesias.  No acute events overnight per nursing staff.  Objective: Vitals:   03/09/24 1941 03/10/24 0500 03/10/24 0628 03/10/24 0630  BP: (!) 144/65  (!) 129/104 125/73  Pulse: 83  80 86  Resp: 18  18   Temp: 98.7 F (37.1 C)  98.2 F (36.8 C)   TempSrc:      SpO2: 96%  97% 98%  Weight:  69.4 kg    Height:        Intake/Output Summary (Last 24 hours) at 03/10/2024 1227 Last data filed at 03/09/2024 1951 Gross per 24 hour  Intake 240 ml  Output --  Net 240 ml   Filed Weights   03/07/24 0452 03/08/24 0500 03/10/24 0500   Weight: 70.3 kg 70.9 kg 69.4 kg    Examination:  Physical Exam: GEN: NAD, alert and oriented x 3, elderly/chronically ill in appearance HEENT: NCAT, PERRL, EOMI, sclera clear, MMM PULM: CTAB w/o wheezes/crackles, normal respiratory effort, room air CV: RRR w/o M/G/R GI: abd soft, NTND, + BS MSK: no peripheral edema, moves all extremities independently NEURO: No focal neurological deficit PSYCH: normal mood/affect Integumentary: No concerning rashes/lesions/wounds noted on exposed skin surfaces    Data Reviewed: I have personally reviewed following labs and imaging studies  CBC: Recent Labs  Lab 03/04/24 1820 03/05/24 0741 03/06/24 0420 03/07/24 0426  WBC 3.3* 2.5* 3.5* 3.3*  NEUTROABS  --  1.4*  --   --   HGB 12.7 12.2 11.6* 12.3  HCT 37.1 35.2* 34.1* 37.1  MCV 103.9* 102.9* 103.6* 104.5*  PLT 103* 107* 102* 94*   Basic Metabolic Panel: Recent Labs  Lab 03/04/24 1820 03/04/24 2007 03/05/24 0741 03/06/24 0420 03/07/24 0426 03/08/24 0443  NA  --  141 138 141 143 145  K  --  3.6 3.4* 4.0 3.8 4.1  CL  --  111 106 109 109 109  CO2  --  16* 20* 22 24 26   GLUCOSE  --  41* 173* 95 84 83  BUN  --  11 9 <5* <5* <5*  CREATININE  --  0.86 1.03* 0.86 0.86 0.92  CALCIUM   --  7.4* 9.2 9.1 8.9 8.9  MG 1.6*  --  2.2 1.5* 1.9 1.8  PHOS  --   --   --  2.3* 2.6  --    GFR: Estimated Creatinine Clearance (by C-G formula based on SCr of 0.92 mg/dL) Female: 61.3 mL/min Female: 47.7 mL/min Liver Function Tests: Recent Labs  Lab 03/04/24 2007 03/05/24 0741  AST 14* 17  ALT <5 <5  ALKPHOS 40 51  BILITOT 0.4 0.6  PROT 4.9* 5.9*  ALBUMIN 3.4* 4.0   Recent Labs  Lab 03/04/24 2007  LIPASE 23   No results for input(s): AMMONIA in the last 168 hours. Coagulation Profile: No results for input(s): INR, PROTIME in the last 168 hours. Cardiac Enzymes: Recent Labs  Lab 03/05/24 0741  CKTOTAL 36*   BNP (last 3 results) No results for input(s): PROBNP in the last  8760 hours. HbA1C: No results for input(s): HGBA1C in the last 72 hours.  CBG: Recent Labs  Lab 03/08/24 0358 03/08/24 0905 03/08/24 1218 03/08/24 1744 03/10/24 0642  GLUCAP 84 96 113* 103* 81   Lipid Profile: No results for input(s): CHOL, HDL, LDLCALC, TRIG, CHOLHDL, LDLDIRECT in the last 72 hours. Thyroid  Function Tests: No results for input(s): TSH, T4TOTAL, FREET4, T3FREE, THYROIDAB in the last 72 hours.  Anemia Panel: No results for input(s): VITAMINB12, FOLATE, FERRITIN, TIBC, IRON , RETICCTPCT in the last 72 hours.  Sepsis Labs: Recent Labs  Lab 03/04/24 1820  LATICACIDVEN 0.9    Recent Results (from the past 240 hours)  Blood culture (routine x 2)     Status: None   Collection Time: 03/04/24  6:20 PM   Specimen: BLOOD  Result Value Ref Range Status   Specimen Description   Final    BLOOD BLOOD LEFT ARM Performed at [facility], [address] [place], [place] [address]    Special Requests   Final    BOTTLES DRAWN AEROBIC ONLY Blood Culture results may not be optimal due to an inadequate volume of blood received in culture bottles Performed at [facility], [address], [place], [place] [address]    Culture   Final    NO GROWTH 5 DAYS Performed at [facility], [address], [place], [place] [address]    Report Status 03/09/2024 FINAL  Final  MRSA Next Gen by PCR, Nasal     Status: None   Collection Time: 03/05/24  6:28 AM   Specimen: Nasal Mucosa; Nasal Swab  Result Value Ref Range Status   MRSA by PCR Next Gen NOT DETECTED NOT DETECTED Final    Comment: (NOTE) The GeneXpert MRSA Assay (FDA approved for NASAL specimens only), is one component of a comprehensive MRSA colonization surveillance program. It is not intended to diagnose MRSA infection nor to guide or monitor treatment for MRSA infections. Test performance is not FDA approved in patients less than 32  years old. Performed at [facility], [address], [place], [place] [address]   Blood culture (routine x 2)     Status: None   Collection Time: 03/05/24  7:41 AM   Specimen: BLOOD  Result Value Ref Range Status   Specimen Description   Final    BLOOD BLOOD RIGHT ARM Performed at [facility], [address], [place], [place] [address]    Special Requests   Final    BOTTLES DRAWN AEROBIC AND ANAEROBIC Blood  Culture results may not be optimal due to an inadequate volume of blood received in culture bottles Performed at [facility], [address], [place], [place] [address]    Culture   Final    NO GROWTH 5 DAYS Performed at [facility], [address], [place], [place] [address]    Report Status 03/10/2024 FINAL  Final  Gastrointestinal Panel by PCR , Stool     Status: None   Collection Time: 03/06/24  1:56 PM   Specimen: Stool  Result Value Ref Range Status   Campylobacter species NOT DETECTED NOT DETECTED Final   Plesimonas shigelloides NOT DETECTED NOT DETECTED Final   Salmonella species NOT DETECTED NOT DETECTED Final   Yersinia enterocolitica NOT DETECTED NOT DETECTED Final   Vibrio species NOT DETECTED NOT DETECTED Final   Vibrio cholerae NOT DETECTED NOT DETECTED Final   Enteroaggregative E coli (EAEC) NOT DETECTED NOT DETECTED Final   Enteropathogenic E coli (EPEC) NOT DETECTED NOT DETECTED Final   Enterotoxigenic E coli (ETEC) NOT DETECTED NOT DETECTED Final   Shiga like toxin producing E coli (STEC) NOT DETECTED NOT DETECTED Final   Shigella/Enteroinvasive E coli (EIEC) NOT DETECTED NOT DETECTED Final   Cryptosporidium NOT DETECTED NOT DETECTED Final   Cyclospora cayetanensis NOT DETECTED NOT DETECTED Final   Entamoeba histolytica NOT DETECTED NOT DETECTED Final   Giardia lamblia NOT DETECTED NOT DETECTED Final   Adenovirus F40/41 NOT DETECTED NOT DETECTED Final   Astrovirus NOT DETECTED NOT DETECTED Final    Norovirus GI/GII NOT DETECTED NOT DETECTED Final   Rotavirus A NOT DETECTED NOT DETECTED Final   Sapovirus (I, II, IV, and V) NOT DETECTED NOT DETECTED Final    Comment: Performed at [facility], [address] [place], [place] [address]         Radiology Studies: No results found.       Scheduled Meds:  amLODipine   2.5 mg Oral Daily   busPIRone   10 mg Oral TID   citalopram   20 mg Oral Daily   divalproex   250 mg Oral BID   Continuous Infusions:     LOS: 5 days    Time spent: 45 minutes spent on 03/10/2024 caring for this patient face-to-face including chart review, ordering labs/tests, documenting, discussion with nursing staff, consultants, updating family and interview/physical exam    Camellia PARAS [place], DO [facility] Available via Epic secure chat 7am-7pm After these hours, please refer to coverage provider listed on [email or web address] 03/10/2024, 12:27 PM

## 2024-03-10 NOTE — Plan of Care (Signed)
  Problem: Education: Goal: Knowledge of General Education information will improve Description: Including pain rating scale, medication(s)/side effects and non-pharmacologic comfort measures Outcome: Progressing   Problem: Health Behavior/Discharge Planning: Goal: Ability to manage health-related needs will improve Outcome: Progressing   Problem: Clinical Measurements: Goal: Will remain free from infection Outcome: Progressing Goal: Diagnostic test results will improve Outcome: Progressing   Problem: Activity: Goal: Risk for activity intolerance will decrease Outcome: Progressing   Problem: Nutrition: Goal: Adequate nutrition will be maintained Outcome: Progressing   Problem: Coping: Goal: Level of anxiety will decrease Outcome: Progressing   Problem: Pain Managment: Goal: General experience of comfort will improve and/or be controlled Outcome: Progressing   Problem: Safety: Goal: Ability to remain free from injury will improve Outcome: Progressing   Problem: Skin Integrity: Goal: Risk for impaired skin integrity will decrease Outcome: Progressing

## 2024-03-11 ENCOUNTER — Ambulatory Visit: Admitting: Student in an Organized Health Care Education/Training Program

## 2024-03-11 DIAGNOSIS — E162 Hypoglycemia, unspecified: Secondary | ICD-10-CM | POA: Diagnosis not present

## 2024-03-11 MED ORDER — LORAZEPAM 0.5 MG PO TABS: 0.5000 mg | ORAL_TABLET | Freq: Four times a day (QID) | ORAL | 0 refills | Status: DC | PRN

## 2024-03-11 MED ORDER — BUSPIRONE HCL 10 MG PO TABS: 10.0000 mg | ORAL_TABLET | Freq: Three times a day (TID) | ORAL | 0 refills | Status: DC

## 2024-03-11 NOTE — Progress Notes (Signed)
 Occupational Therapy Treatment Patient Details Name: [name] [name] MRN: [id number] DOB: 1940/11/27 Today's Date: 03/11/2024   History of present illness 83 y.o. who is admitted to [facility] on 03/04/2024 with hypoglycemia after presenting from home to [facility] ED complaining of nausea vomiting, suspected etiology secondary to her continued radiation treatment for lung cancer. Pt with mechanical fall 03/04/24 xray and CT negative. PMH: stage Ia adenocarcinoma of the left lung, undergoing radiation therapy, hypertension, GAD, depression,   OT comments  Patient seated in recliner with belongings packed for discharge later this pm. Patient open to all therapy presented including breathing strategies as well as ECT principles with % P's Handout issued and presented. OT recommended to patient to further progress use in strategies with home OT services and placed in bag for d/c.  Patient requires continued Acute care hospital level OT services to progress safety and functional performance and allow for discharge.        If plan is discharge home, recommend the following:  A little help with walking and/or transfers;A little help with bathing/dressing/bathroom   Equipment Recommendations  None recommended by OT       Precautions / Restrictions Precautions Precautions: Fall Recall of Precautions/Restrictions: Intact Restrictions Weight Bearing Restrictions Per Provider Order: No       Mobility Bed Mobility               General bed mobility comments: pt seated in recliner when OT arrived    Transfers Overall transfer level: Needs assistance Equipment used: Rolling walker (2 wheels) Transfers: Sit to/from Stand Sit to Stand: Supervision                 Balance Overall balance assessment: Needs assistance Sitting-balance support: Feet supported Sitting balance-Leahy Scale: Good     Standing balance support: Bilateral upper extremity supported, No upper extremity  supported, During functional activity Standing balance-Leahy Scale: Fair Standing balance comment: static standing no UE support B UE support at [facility] for dynamic activities                           ADL either performed or assessed with clinical judgement   ADL Overall ADL's : Needs assistance/impaired Eating/Feeding: Modified independent   Grooming: Brushing hair;Minimal assistance;Sitting Grooming Details (indicate cue type and reason): The pt performed hair combing seated EOB, requiring min assist to comb posterior aspect of her head only. Upper Body Bathing: Set up   Lower Body Bathing: Minimal assistance Lower Body Bathing Details (indicate cue type and reason): RLE, typically able to pull up to figure 4 - not currently Upper Body Dressing : Set up   Lower Body Dressing: Contact guard assist;Sit to/from stand;Supervision/safety   Toilet Transfer: Contact guard assist;Ambulation;Rolling walker (2 wheels);Supervision/safety   Toileting- Clothing Manipulation and Hygiene: Supervision/safety;Sitting/lateral lean       Functional mobility during ADLs: Contact guard assist;Rolling walker (2 wheels);Supervision/safety General ADL Comments: Issued and trained in 5 P's strategies    Extremity/Trunk Assessment Upper Extremity Assessment Upper Extremity Assessment: Overall WFL for tasks assessed            Vision   Vision Assessment?: No apparent visual deficits         Communication Communication Communication: No apparent difficulties   Cognition Arousal: Alert Behavior During Therapy: WFL for tasks assessed/performed Cognition: No apparent impairments             OT - Cognition Comments: Pt within sound mind,  decreased safety awareness in general                 Following commands: Intact        Cueing   Cueing Techniques: Verbal cues        General Comments patient reports her neighbor Izetta is coming to bring her home by 4 pm, ECT  Handout issued 5 P's and reinforced for A/IALD's    Pertinent Vitals/ Pain       Pain Assessment Pain Assessment: 0-10 Pain Score: 2  Breathing: normal Negative Vocalization: none Facial Expression: sad, frightened, frown Body Language: relaxed Consolability: no need to console PAINAD Score: 1 Pain Location: headache Pain Descriptors / Indicators: Grimacing, Sharp, Nagging, Crying Pain Intervention(s): Patient requesting pain meds-RN notified, Monitored during session   Frequency  Min 2X/week        Progress Toward Goals  OT Goals(current goals can now be found in the care plan section)     Acute Rehab OT Goals Patient Stated Goal: to get home today OT Goal Formulation: With patient Time For Goal Achievement: 03/19/24 Potential to Achieve Goals: Good ADL Goals Pt Will Perform Lower Body Dressing: with supervision;with adaptive equipment;sitting/lateral leans;sit to/from stand Pt Will Transfer to Toilet: with supervision;ambulating Pt Will Perform Toileting - Clothing Manipulation and hygiene: with supervision;sit to/from stand  Plan         AM-PAC OT 6 Clicks Daily Activity     Outcome Measure   Help from another person eating meals?: None Help from another person taking care of personal grooming?: None Help from another person toileting, which includes using toliet, bedpan, or urinal?: A Little Help from another person bathing (including washing, rinsing, drying)?: A Little Help from another person to put on and taking off regular upper body clothing?: None Help from another person to put on and taking off regular lower body clothing?: A Little 6 Click Score: 21    End of Session Equipment Utilized During Treatment: Rolling walker (2 wheels)  OT Visit Diagnosis: Unsteadiness on feet (R26.81);Other abnormalities of gait and mobility (R26.89);Muscle weakness (generalized) (M62.81);History of falling (Z91.81);Pain Pain - Right/Left: Right Pain - part of body:   (headache)   Activity Tolerance Patient tolerated treatment well   Patient Left with call bell/phone within reach;in chair;with chair alarm set   Nurse Communication Mobility status        Time: 8497-8463 OT Time Calculation (min): 34 min  Charges: OT General Charges $OT Visit: 1 Visit OT Treatments $Self Care/Home Management : 8-22 mins $Therapeutic Activity: 8-22 mins  Kasia Trego OT/L Acute Rehabilitation Department  [phone]  03/11/2024, 3:38 PM

## 2024-03-11 NOTE — Discharge Summary (Signed)
 Physician Discharge Summary  TA FAIR FMW:[id number] DOB: Nov 24, 1940 DOA: 03/04/2024  PCP: [name], DO  Admit date: 03/04/2024 Discharge date: 03/11/2024  Admitted From: Home Disposition: Home with home health  Recommendations for Outpatient Follow-up:  Follow up with PCP in 1-2 weeks Started on lorazepam  0.5 mg as needed for anxiety May need to consider placement such as assisted living in the near future  Home Health: PT/OT/aide/RN/social work Equipment/Devices: None  Discharge Condition: Stable CODE STATUS: DNR Diet recommendation: Regular diet  History of present illness:  [name] [name] is a 83 y.o. adult with past medical history significant for stage I adenocarcinoma left lung on radiation therapy, HTN, GAD/depression, DM2 (diet controlled) who presented to [facility] ED on 03/04/2024 from home with complaints of nausea and vomiting.  Patient reports 2 days of recurrent nausea resulting in 3-4 daily episodes of nonbloody, nonbilious emesis.  Additionally she reports generalized weakness which resulted in a ground-level mechanical fall day of ED arrival.  Denies striking her head.  Denies loss of consciousness.  Further denies fever, no chills, no myalgias, no cough/shortness of breath, no abdominal pain, no urinary symptoms.   In the ED, temperature 97.9 F, HR 80, RR 20, BP 161/77, SpO2 96% on room air.  WBC 3.3, hemoglobin 12.7, platelet count 103.  Sodium 141, potassium 3.6, chloride 111, CO2 16, glucose 41, BUN 11, creatinine 0.86.  AST 14, ALT less than 5, total bilirubin 0.4.  Lipase 23.  Lactic acid 0.9.  Magnesium  1.6.  Urinalysis unrevealing.  CT head without contrast with chronic atrophic and ischemic changes without acute abnormality.  CT chest/abdomen/pelvis with known left upper lobe mass slightly increased in size from prior now measuring 14 mm, stable left thyroid  nodule previously evaluated with biopsy, no acute abnormalities in the abdomen/pelvis.  Right hip/pelvis x-ray  with marked severity degenerative changes, no fracture/dislocation noted.  Blood cultures x 2 collected.  Patient was given D50 x 1, Zofran  4 mg IV x 1, NS 1 L bolus.  Patient admitted for evaluation of generalized weakness, nausea/vomiting, hyperglycemia.  Hospital course:  Nausea/vomiting, recurrent Patient presenting with recurrent nausea with nonbloody/nonbilious emesis over the last 2 days, limiting oral intake over that timeframe.  No evidence of acute intra-abdominal or acute intrapelvic process on CT imaging, and no evidence of acute pancreatitis or bowel obstruction.  Suspect etiology secondary to her continued radiation treatment for lung cancer.  Initially treated with IV fluids, now stopped.  Diet was slowly advanced with toleration.  Outpatient follow-up with PCP.   Hypoglycemia Hx DM2 Etiology secondary to recurrent nausea and vomiting and poor oral intake.  History of diabetes but diet controlled at baseline (not on insulin oral hypoglycemic agents).  Hemoglobin A1c: 4.6%.  Initially supported with IV fluids which have been discontinued.  Patient's glucose remained stable during the remainder of hospitalization.  Continue regular diet.   Diarrhea Afebrile without leukocytosis.  GI PCR panel negative. Imodium  OTC as needed   Migraine headache Tylenol  as needed   Hypomagnesemia Etiology likely due to GI loss with nausea/vomiting as above.  Repleted.   Hypophosphatemia Repleted   Generalized weakness Etiology likely secondary to dehydration, poor oral intake.  Urinalysis inconsistent with UTI, CT chest/abdomen/pelvis relatively unrevealing.  CT head with no evidence of acute intracranial process.  TSH within normal limits.  Seen by PT and OT during hospitalization.  Discharging with home health.   Leukopenia:  WBC count 3300;  compared to most recent prior value of 6900 on 01/02/2024.  While she has a history of stage I adenocarcinoma of the left upper lobe of lung, distant. She  is on chemotherapy with no current evidence of contributory underlying infectious process identified at this time, although we will continue to monitor for evidence of gram-negative infection, including following for results of blood cultures x 2 collected this evening. Urinalysis showed no evidence of acute infection, no evidence of acute infection identified on today CT chest, abdomen, pelvis. Blood cultures x 2: no growth x 5 days   Stage I adenocarcinoma of the left upper lobe of the lung  Follows with medical oncology, Dr. Sherrod outpatient.  Currently on radiation treatment.   HTN Amlodipine  2.5 mg p.o. daily; furosemide  twice weekly   GAD/depression Celexa  20 mg p.o. daily, BuSpar  10 mg p.o. 3 times daily, Depakote  250 mg p.o. twice daily.    Obesity, class I Body mass index is 30.9 kg/m.  Discharge Diagnoses:  Principal Problem:   Hypoglycemia Active Problems:   Essential hypertension   Nausea & vomiting   Fall at home, initial encounter   Generalized weakness   Hypomagnesemia   Leukopenia   Lung cancer (HCC)   GAD (generalized anxiety disorder)   Depression    Discharge Instructions  Discharge Instructions     Call MD for:  difficulty breathing, headache or visual disturbances   Complete by: As directed    Call MD for:  extreme fatigue   Complete by: As directed    Call MD for:  persistant dizziness or light-headedness   Complete by: As directed    Call MD for:  persistant nausea and vomiting   Complete by: As directed    Call MD for:  severe uncontrolled pain   Complete by: As directed    Call MD for:  temperature >100.4   Complete by: As directed    Diet - low sodium heart healthy   Complete by: As directed    Increase activity slowly   Complete by: As directed       Allergies as of 03/11/2024       Reactions   Statins    Statins Swelling, Rash, Other (See Comments)   Swelling involving tongue; also causes muscle pain        Medication List      TAKE these medications    acetaminophen  500 MG tablet Commonly known as: TYLENOL  Take 500 mg by mouth every 8 (eight) hours as needed for moderate pain. What changed: Another medication with the same name was removed. Continue taking this medication, and follow the directions you see here.   albuterol  108 (90 Base) MCG/ACT inhaler Commonly known as: VENTOLIN  HFA Inhale 1 puff into the lungs daily as needed for shortness of breath.   amLODipine  2.5 MG tablet Commonly known as: NORVASC  TAKE 1 TABLET BY MOUTH DAILY   busPIRone  10 MG tablet Commonly known as: BUSPAR  Take 1 tablet (10 mg total) by mouth 3 (three) times daily.   citalopram  20 MG tablet Commonly known as: CELEXA  Take 1 tablet (20 mg total) by mouth daily.   clopidogrel  75 MG tablet Commonly known as: PLAVIX  TAKE 1 TABLET BY MOUTH DAILY   divalproex  125 MG DR tablet Commonly known as: DEPAKOTE  TAKE 2 TABLETS BY MOUTH TWICE A DAY   furosemide  20 MG tablet Commonly known as: LASIX  Take 20 mg by mouth every other day.   gabapentin  100 MG capsule Commonly known as: NEURONTIN  Take 1 capsule (100 mg total) by mouth 3 (three) times  daily.   Gerhardt's butt cream Crea Apply 1 Application topically 2 (two) times daily.   LORazepam  0.5 MG tablet Commonly known as: ATIVAN  Take 1 tablet (0.5 mg total) by mouth every 6 (six) hours as needed for anxiety.   Nasal Moist Gel Apply after using nasal saline spray What changed:  how much to take how to take this when to take this reasons to take this   nystatin  powder Commonly known as: MYCOSTATIN /NYSTOP  Apply 1 Application topically 3 (three) times daily. Under the breast   nystatin -triamcinolone  ointment Commonly known as: MYCOLOG Apply 1 Application topically 2 (two) times daily. Under the breasts   pantoprazole  40 MG tablet Commonly known as: PROTONIX  TAKE 1 TABLET BY MOUTH 2 TIMES A DAY   sodium chloride  0.65 % Soln nasal spray Commonly known as:  OCEAN Use twice daily to keep nose moist. What changed:  how much to take how to take this when to take this reasons to take this additional instructions   traMADol  50 MG tablet Commonly known as: ULTRAM  Take 1 tablet (50 mg total) by mouth every 6 (six) hours as needed.        Follow-up Information     [name], DO. Schedule an appointment as soon as possible for a visit in 1 week(s).   Specialty: Family Medicine Contact information: [address] [place] [address] [phone]                Allergies  Allergen Reactions   Statins    Statins Swelling, Rash and Other (See Comments)    Swelling involving tongue; also causes muscle pain    Consultations: none   Procedures/Studies: CT Head Wo Contrast Result Date: 03/04/2024 CLINICAL DATA:  History of lung carcinoma with recent fall, initial encounter EXAM: CT HEAD WITHOUT CONTRAST TECHNIQUE: Contiguous axial images were obtained from the base of the skull through the vertex without intravenous contrast. RADIATION DOSE REDUCTION: This exam was performed according to the departmental dose-optimization program which includes automated exposure control, adjustment of the mA and/or kV according to patient size and/or use of iterative reconstruction technique. COMPARISON:  04/01/2023 FINDINGS: Brain: No evidence of acute infarction, hemorrhage, hydrocephalus, extra-axial collection or mass lesion/mass effect. Mild atrophic changes and chronic white matter ischemic changes are seen. Vascular: No hyperdense vessel or unexpected calcification. Skull: Normal. Negative for fracture or focal lesion. Sinuses/Orbits: No acute finding. Other: None. IMPRESSION: Chronic atrophic and ischemic changes without acute abnormality. Electronically Signed   By: Oneil Devonshire M.D.   On: 03/04/2024 23:36   CT CHEST ABDOMEN PELVIS W CONTRAST Result Date: 03/04/2024 CLINICAL DATA:  History of left upper lobe carcinoma with chest and  abdominal pain, history of recent fall with right hip pain, initial encounter EXAM: CT CHEST, ABDOMEN, AND PELVIS WITH CONTRAST TECHNIQUE: Multidetector CT imaging of the chest, abdomen and pelvis was performed following the standard protocol during bolus administration of intravenous contrast. RADIATION DOSE REDUCTION: This exam was performed according to the departmental dose-optimization program which includes automated exposure control, adjustment of the mA and/or kV according to patient size and/or use of iterative reconstruction technique. CONTRAST:  OMNIPAQUE  IOHEXOL  300 MG/ML  SOLN COMPARISON:  12/24/2023 FINDINGS: CT CHEST FINDINGS Cardiovascular: Atherosclerotic calcifications of the thoracic aorta are noted. No aneurysmal dilatation or dissection is noted. Heart is not significantly enlarged. Coronary calcifications are noted. The pulmonary artery shows no evidence of pulmonary embolus although not timed for embolus evaluation. Mediastinum/Nodes: Thoracic inlet again shows a heterogeneous  left thyroid  nodule which shows no significant uptake on prior PET-CT. This has been previously evaluated. No hilar or mediastinal adenopathy is noted. The esophagus as visualized is within normal limits. Lungs/Pleura: Lungs are well aerated bilaterally. Known left lung carcinoma is seen along the fissure measuring up to 14 mm. This is slightly decreased in size when compared with the prior exam. Mild traction on the adjacent fissure is seen. No focal infiltrate or sizable effusion is seen. Atelectatic changes are noted in the medial right lung base. No other suspicious nodule is seen. Musculoskeletal: Postsurgical changes in the cervical spine are noted. Degenerative changes of the thoracic spine are noted. CT ABDOMEN PELVIS FINDINGS Hepatobiliary: Fatty infiltration of the liver is noted. The gallbladder is within normal limits. Pancreas: Unremarkable. No pancreatic ductal dilatation or surrounding inflammatory  changes. Spleen: Normal in size without focal abnormality. Adrenals/Urinary Tract: Adrenal glands are within normal limits. Left kidney is significantly atrophic stable from prior exams. Right kidney is within normal limits. No renal calculi or obstructive changes are noted. The bladder is well distended. Stomach/Bowel: Scattered diverticular change of the colon is noted without evidence of diverticulitis. No obstructive changes of the colon are noted. The appendix is not seen consistent with prior surgical history. Small bowel and stomach are within normal limits. Vascular/Lymphatic: Aortic atherosclerosis. No enlarged abdominal or pelvic lymph nodes. Reproductive: Status post hysterectomy. No adnexal masses. Other: No abdominal wall hernia or abnormality. No abdominopelvic ascites. Musculoskeletal: Degenerative changes of the lumbar spine are noted. IMPRESSION: CT of the chest: Known left upper lobe mass slightly decreased in size when compared with the prior PET-CT now measuring 14 mm. Stable heterogeneous left thyroid  nodule previously evaluated with biopsy. This has been evaluated on previous imaging. (ref: J Am Coll Radiol. 2015 Feb;12(2): 143-50). CT of the abdomen and pelvis: Chronic changes as described above without acute abnormality. Electronically Signed   By: Oneil Devonshire M.D.   On: 03/04/2024 23:35   DG Hip Unilat W or Wo Pelvis 2-3 Views Right Result Date: 03/04/2024 CLINICAL DATA:  Right hip pain status post fall. EXAM: DG HIP (WITH OR WITHOUT PELVIS) 2-3V RIGHT COMPARISON:  None Available. FINDINGS: There is no evidence of an acute hip fracture or dislocation. Marked severity degenerative changes are seen in the form of joint space narrowing and acetabular sclerosis. Degenerative changes are also seen involving the visualized portion of the lower lumbar spine. Bilateral radiopaque vascular stents are seen overlying the pelvis. IMPRESSION: Marked severity degenerative changes of the right hip.  Electronically Signed   By: Suzen Dials M.D.   On: 03/04/2024 19:53     Subjective: Patient seen examined bedside, sitting in bedside chair.  No complaints this morning.  States ready for discharge home today.  States that she will be moving to a different residence soon due to mold.  Discharge Exam: Vitals:   03/10/24 1930 03/11/24 0533  BP: (!) 142/63 (!) 152/76  Pulse: 78 68  Resp: (!) 21 20  Temp: 98.7 F (37.1 C) 97.7 F (36.5 C)  SpO2: 95% 95%   Vitals:   03/10/24 1514 03/10/24 1930 03/11/24 0500 03/11/24 0533  BP: 128/73 (!) 142/63  (!) 152/76  Pulse: 93 78  68  Resp: 16 (!) 21  20  Temp: (!) 97.3 F (36.3 C) 98.7 F (37.1 C)  97.7 F (36.5 C)  TempSrc:  Oral    SpO2: 96% 95%  95%  Weight:   70.6 kg   Height:  Physical Exam: GEN: NAD, alert and oriented x 3, elderly in appearance HEENT: NCAT, PERRL, EOMI, sclera clear, MMM PULM: CTAB w/o wheezes/crackles, normal respiratory effort, room air CV: RRR w/o M/G/R GI: abd soft, NTND, + BS MSK: no peripheral edema, moves all extremities independently NEURO: No focal neurological deficit PSYCH: normal mood/affect Integumentary: No concerning rashes/lesions/wounds noted on exposed skin surfaces    The results of significant diagnostics from this hospitalization (including imaging, microbiology, ancillary and laboratory) are listed below for reference.     Microbiology: Recent Results (from the past 240 hours)  Blood culture (routine x 2)     Status: None   Collection Time: 03/04/24  6:20 PM   Specimen: BLOOD  Result Value Ref Range Status   Specimen Description   Final    BLOOD BLOOD LEFT ARM Performed at [facility], [address], [place], [place] [address]    Special Requests   Final    BOTTLES DRAWN AEROBIC ONLY Blood Culture results may not be optimal due to an inadequate volume of blood received in culture bottles Performed at [facility], [address] [place], [place] [address]    Culture   Final    NO GROWTH 5 DAYS Performed at [facility], [address] [place], [place] [address]    Report Status 03/09/2024 FINAL  Final  MRSA Next Gen by PCR, Nasal     Status: None   Collection Time: 03/05/24  6:28 AM   Specimen: Nasal Mucosa; Nasal Swab  Result Value Ref Range Status   MRSA by PCR Next Gen NOT DETECTED NOT DETECTED Final    Comment: (NOTE) The GeneXpert MRSA Assay (FDA approved for NASAL specimens only), is one component of a comprehensive MRSA colonization surveillance program. It is not intended to diagnose MRSA infection nor to guide or monitor treatment for MRSA infections. Test performance is not FDA approved in patients less than 16 years old. Performed at [facility], [address] [place], [place] [address]   Blood culture (routine x 2)     Status: None   Collection Time: 03/05/24  7:41 AM   Specimen: BLOOD  Result Value Ref Range Status   Specimen Description   Final    BLOOD BLOOD RIGHT ARM Performed at [facility], [address] [place], [place] [address]    Special Requests   Final    BOTTLES DRAWN AEROBIC AND ANAEROBIC Blood Culture results may not be optimal due to an inadequate volume of blood received in culture bottles Performed at [facility], [address], [place], [place] [address]    Culture   Final    NO GROWTH 5 DAYS Performed at [facility], [address] [place], [place] [address]    Report Status 03/10/2024 FINAL  Final  Gastrointestinal Panel by PCR , Stool     Status: None   Collection Time: 03/06/24  1:56 PM   Specimen: Stool  Result Value Ref Range Status   Campylobacter species NOT DETECTED NOT DETECTED Final   Plesimonas shigelloides NOT DETECTED NOT DETECTED Final   Salmonella species NOT DETECTED NOT DETECTED Final   Yersinia enterocolitica NOT DETECTED NOT DETECTED Final   Vibrio species NOT DETECTED  NOT DETECTED Final   Vibrio cholerae NOT DETECTED NOT DETECTED Final   Enteroaggregative E coli (EAEC) NOT DETECTED NOT DETECTED Final   Enteropathogenic E coli (EPEC) NOT DETECTED NOT DETECTED Final  Enterotoxigenic E coli (ETEC) NOT DETECTED NOT DETECTED Final   Shiga like toxin producing E coli (STEC) NOT DETECTED NOT DETECTED Final   Shigella/Enteroinvasive E coli (EIEC) NOT DETECTED NOT DETECTED Final   Cryptosporidium NOT DETECTED NOT DETECTED Final   Cyclospora cayetanensis NOT DETECTED NOT DETECTED Final   Entamoeba histolytica NOT DETECTED NOT DETECTED Final   Giardia lamblia NOT DETECTED NOT DETECTED Final   Adenovirus F40/41 NOT DETECTED NOT DETECTED Final   Astrovirus NOT DETECTED NOT DETECTED Final   Norovirus GI/GII NOT DETECTED NOT DETECTED Final   Rotavirus A NOT DETECTED NOT DETECTED Final   Sapovirus (I, II, IV, and V) NOT DETECTED NOT DETECTED Final    Comment: Performed at [facility], [address] [place], [place] [address]     Labs: BNP (last 3 results) No results for input(s): BNP in the last 8760 hours. Basic Metabolic Panel: Recent Labs  Lab 03/04/24 1820 03/04/24 2007 03/05/24 0741 03/06/24 0420 03/07/24 0426 03/08/24 0443  NA  --  141 138 141 143 145  K  --  3.6 3.4* 4.0 3.8 4.1  CL  --  111 106 109 109 109  CO2  --  16* 20* 22 24 26   GLUCOSE  --  41* 173* 95 84 83  BUN  --  11 9 <5* <5* <5*  CREATININE  --  0.86 1.03* 0.86 0.86 0.92  CALCIUM   --  7.4* 9.2 9.1 8.9 8.9  MG 1.6*  --  2.2 1.5* 1.9 1.8  PHOS  --   --   --  2.3* 2.6  --    Liver Function Tests: Recent Labs  Lab 03/04/24 2007 03/05/24 0741  AST 14* 17  ALT <5 <5  ALKPHOS 40 51  BILITOT 0.4 0.6  PROT 4.9* 5.9*  ALBUMIN 3.4* 4.0   Recent Labs  Lab 03/04/24 2007  LIPASE 23   No results for input(s): AMMONIA in the last 168 hours. CBC: Recent Labs  Lab 03/04/24 1820 03/05/24 0741 03/06/24 0420 03/07/24 0426  WBC 3.3* 2.5* 3.5* 3.3*  NEUTROABS  --   1.4*  --   --   HGB 12.7 12.2 11.6* 12.3  HCT 37.1 35.2* 34.1* 37.1  MCV 103.9* 102.9* 103.6* 104.5*  PLT 103* 107* 102* 94*   Cardiac Enzymes: Recent Labs  Lab 03/05/24 0741  CKTOTAL 36*   BNP: Invalid input(s): POCBNP CBG: Recent Labs  Lab 03/08/24 0358 03/08/24 0905 03/08/24 1218 03/08/24 1744 03/10/24 0642  GLUCAP 84 96 113* 103* 81   D-Dimer No results for input(s): DDIMER in the last 72 hours. Hgb A1c No results for input(s): HGBA1C in the last 72 hours. Lipid Profile No results for input(s): CHOL, HDL, LDLCALC, TRIG, CHOLHDL, LDLDIRECT in the last 72 hours. Thyroid  function studies No results for input(s): TSH, T4TOTAL, T3FREE, THYROIDAB in the last 72 hours.  Invalid input(s): FREET3 Anemia work up No results for input(s): VITAMINB12, FOLATE, FERRITIN, TIBC, IRON , RETICCTPCT in the last 72 hours. Urinalysis    Component Value Date/Time   COLORURINE STRAW (A) 03/04/2024 1845   APPEARANCEUR CLEAR 03/04/2024 1845   LABSPEC 1.009 03/04/2024 1845   PHURINE 5.0 03/04/2024 1845   GLUCOSEU NEGATIVE 03/04/2024 1845   HGBUR NEGATIVE 03/04/2024 1845   BILIRUBINUR NEGATIVE 03/04/2024 1845   BILIRUBINUR negative 02/18/2020 0906   BILIRUBINUR NEG 06/08/2015 1447   KETONESUR 5 (A) 03/04/2024 1845   PROTEINUR NEGATIVE 03/04/2024 1845   UROBILINOGEN 0.2 02/18/2020 0906   UROBILINOGEN 0.2 10/19/2014  1619   NITRITE NEGATIVE 03/04/2024 1845   LEUKOCYTESUR NEGATIVE 03/04/2024 1845   Sepsis Labs Recent Labs  Lab 03/04/24 1820 03/05/24 0741 03/06/24 0420 03/07/24 0426  WBC 3.3* 2.5* 3.5* 3.3*   Microbiology Recent Results (from the past 240 hours)  Blood culture (routine x 2)     Status: None   Collection Time: 03/04/24  6:20 PM   Specimen: BLOOD  Result Value Ref Range Status   Specimen Description   Final    BLOOD BLOOD LEFT ARM Performed at [facility], [address], [place], [place] [address]     Special Requests   Final    BOTTLES DRAWN AEROBIC ONLY Blood Culture results may not be optimal due to an inadequate volume of blood received in culture bottles Performed at [facility], [address], [place], [place] [address]    Culture   Final    NO GROWTH 5 DAYS Performed at [facility], [address] [place], [place] [address]    Report Status 03/09/2024 FINAL  Final  MRSA Next Gen by PCR, Nasal     Status: None   Collection Time: 03/05/24  6:28 AM   Specimen: Nasal Mucosa; Nasal Swab  Result Value Ref Range Status   MRSA by PCR Next Gen NOT DETECTED NOT DETECTED Final    Comment: (NOTE) The GeneXpert MRSA Assay (FDA approved for NASAL specimens only), is one component of a comprehensive MRSA colonization surveillance program. It is not intended to diagnose MRSA infection nor to guide or monitor treatment for MRSA infections. Test performance is not FDA approved in patients less than 16 years old. Performed at [facility], [address] [place], [place] [address]   Blood culture (routine x 2)     Status: None   Collection Time: 03/05/24  7:41 AM   Specimen: BLOOD  Result Value Ref Range Status   Specimen Description   Final    BLOOD BLOOD RIGHT ARM Performed at [facility], [address] [place], [place] [address]    Special Requests   Final    BOTTLES DRAWN AEROBIC AND ANAEROBIC Blood Culture results may not be optimal due to an inadequate volume of blood received in culture bottles Performed at [facility], [address] [place], [place] [address]    Culture   Final    NO GROWTH 5 DAYS Performed at [facility], [address] [place], [place] [address]    Report Status 03/10/2024 FINAL  Final  Gastrointestinal Panel by PCR , Stool     Status: None   Collection Time: 03/06/24  1:56 PM   Specimen: Stool  Result Value Ref Range Status   Campylobacter species NOT DETECTED NOT  DETECTED Final   Plesimonas shigelloides NOT DETECTED NOT DETECTED Final   Salmonella species NOT DETECTED NOT DETECTED Final   Yersinia enterocolitica NOT DETECTED NOT DETECTED Final   Vibrio species NOT DETECTED NOT DETECTED Final   Vibrio cholerae NOT DETECTED NOT DETECTED Final   Enteroaggregative E coli (EAEC) NOT DETECTED NOT DETECTED Final   Enteropathogenic E coli (EPEC) NOT DETECTED NOT DETECTED Final   Enterotoxigenic E coli (ETEC) NOT DETECTED NOT DETECTED Final   Shiga like toxin producing E coli (STEC) NOT DETECTED NOT DETECTED Final   Shigella/Enteroinvasive E coli (EIEC) NOT DETECTED NOT DETECTED Final   Cryptosporidium NOT DETECTED NOT DETECTED Final   Cyclospora cayetanensis NOT DETECTED NOT DETECTED  Final   Entamoeba histolytica NOT DETECTED NOT DETECTED Final   Giardia lamblia NOT DETECTED NOT DETECTED Final   Adenovirus F40/41 NOT DETECTED NOT DETECTED Final   Astrovirus NOT DETECTED NOT DETECTED Final   Norovirus GI/GII NOT DETECTED NOT DETECTED Final   Rotavirus A NOT DETECTED NOT DETECTED Final   Sapovirus (I, II, IV, and V) NOT DETECTED NOT DETECTED Final    Comment: Performed at [facility], [address] [place], [place] [address]     Time coordinating discharge: Over 30 minutes  SIGNED:   Camellia PARAS [place], DO  [facility] 03/11/2024, 9:48 AM

## 2024-03-19 ENCOUNTER — Encounter: Payer: Self-pay | Admitting: Student in an Organized Health Care Education/Training Program

## 2024-03-19 ENCOUNTER — Ambulatory Visit: Admitting: Student in an Organized Health Care Education/Training Program

## 2024-03-19 VITALS — BP 138/84 | HR 84 | Temp 97.1°F | Ht 59.0 in | Wt 153.2 lb

## 2024-03-19 DIAGNOSIS — C3412 Malignant neoplasm of upper lobe, left bronchus or lung: Secondary | ICD-10-CM

## 2024-03-19 DIAGNOSIS — Z87891 Personal history of nicotine dependence: Secondary | ICD-10-CM | POA: Diagnosis not present

## 2024-03-19 DIAGNOSIS — R0602 Shortness of breath: Secondary | ICD-10-CM | POA: Diagnosis not present

## 2024-03-19 MED ORDER — UMECLIDINIUM-VILANTEROL 62.5-25 MCG/ACT IN AEPB: 1.0000 | INHALATION_SPRAY | Freq: Every day | RESPIRATORY_TRACT | 12 refills | Status: AC
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
## 2024-03-31 ENCOUNTER — Ambulatory Visit: Payer: Self-pay | Admitting: Student in an Organized Health Care Education/Training Program

## 2024-03-31 DIAGNOSIS — U071 COVID-19: Secondary | ICD-10-CM

## 2024-03-31 MED ORDER — PAXLOVID (150/100) 10 X 150 MG & 10 X 100MG PO TBPK: 2.0000 | ORAL_TABLET | Freq: Two times a day (BID) | ORAL | 0 refills | Status: AC

## 2024-03-31 NOTE — Telephone Encounter (Addendum)
 FYI Only or Action Required?: FYI only for provider.  Patient is followed in Pulmonology for lung cancer, last seen on 03/19/2024 by Isadora Hose, MD.  Called Nurse Triage reporting Covid Positive.  Symptoms began several days ago.  Symptoms are: unchanged.  Triage Disposition: Call PCP Within 24 Hours  Patient/caregiver understands and will follow disposition?: Yes     Copied from CRM #[id number]. Topic: Clinical - Red Word Triage >> Mar 31, 2024 10:40 AM Russell PARAS wrote: Red Word that prompted transfer to Nurse Triage:   Tested COVID+ on Saturday Cough, goes between dry and productive Still having ongoing diarrhea History of lung cancer Very weak, but just finished cancer treatments  No wheezing  Pt of Dr. Isadora      Reason for Disposition  [1] HIGH RISK patient (e.g., weak immune system, 65 years and older, obesity with BMI 30 or higher, pregnant, chronic lung disease) AND [2] COVID symptoms (e.g., cough, fever)  (Exceptions: Already seen by PCP and no new or worsening symptoms.)  Answer Assessment - Initial Assessment Questions Patient calling to cancer her PFT appointment tomorrow due to testing positive for COVID. I have cancelled the appointment and the patient will call back to reschedule it. Patient denies any new or worsening shortness of breath at this time.     1. SYMPTOMS: What is your main symptom or concern? (e.g., cough, fever, shortness of breath, muscle aches)     Diarrhea, cough, feels weak but just finished cancer treatment  2. ONSET: When did the symptoms start?      4-5 days ago  3. COUGH: Do you have a cough? If Yes, ask: How bad is the cough?       Mild to moderate  4. FEVER: Do you have a fever? If Yes, ask: What is your temperature, how was it measured, and when did it start?     Mild  5. BREATHING DIFFICULTY: Are you having any difficulty breathing? (e.g., normal; shortness of breath, wheezing, unable to speak)      No 6.  BETTER-SAME-WORSE: Are you getting better, staying the same or getting worse compared to yesterday?  If getting worse, ask, In what way?     Same  7. OTHER SYMPTOMS: Do you have any other symptoms?  (e.g., chills, fatigue, headache, loss of smell or taste, muscle pain, sore throat)     Fatigue  8. COVID-19 DIAGNOSIS: How do you know that you have COVID? (e.g., positive lab test or self-test, diagnosed by doctor or NP/PA, symptoms after exposure).     03/29/24 9. COVID-19 EXPOSURE: Was there any known exposure to COVID before the symptoms began?      Yes 10. COVID-19 VACCINE: Have you had the COVID-19 vaccine? If Yes, ask: When did you last get it?       Has had the vaccine but has not had it this year  60. HIGH RISK DISEASE: Do you have any chronic medical problems? (e.g., asthma, heart or lung disease, weak immune system, obesity, etc.)       Lung cancer  Protocols used: COVID-19 - Diagnosed or Suspected-A-AH

## 2024-03-31 NOTE — Addendum Note (Signed)
 Addended by: Laurance Heide J on: 03/31/2024 03:05 PM   Modules accepted: Orders

## 2024-03-31 NOTE — Telephone Encounter (Signed)
 Notified pt that RX has been sent into her pharmacy for pick up.

## 2024-03-31 NOTE — Telephone Encounter (Signed)
 Paxlovid  RX has been sent in to pharmacy on file.

## 2024-04-01 ENCOUNTER — Encounter

## 2024-04-04 ENCOUNTER — Telehealth: Payer: Self-pay

## 2024-04-04 NOTE — Telephone Encounter (Signed)
 Betsy from [place] calling for PT verbal orders as follows:  2 time(s) weekly for 3 week(s), then 1 time(s) weekly for 2 week(s).   Also requesting a nursing evaluation.   Verbal orders given per [facility] protocol  Chiquita JAYSON English, RN

## 2024-04-11 ENCOUNTER — Telehealth: Payer: Self-pay

## 2024-04-11 NOTE — Telephone Encounter (Signed)
 Jenna from [facility] calling for nursing verbal orders as follows:  1 time(s) weekly for 4 week(s)  Verbal orders given per [facility] protocol.  Chiquita JAYSON English, RN

## 2024-04-15 ENCOUNTER — Other Ambulatory Visit: Payer: Self-pay | Admitting: Student

## 2024-04-15 DIAGNOSIS — I10 Essential (primary) hypertension: Secondary | ICD-10-CM

## 2024-04-22 ENCOUNTER — Ambulatory Visit (HOSPITAL_COMMUNITY)
Admission: RE | Admit: 2024-04-22 | Discharge: 2024-04-22 | Disposition: A | Discharge status: Home / Self Care | Source: Ambulatory Visit | Attending: Internal Medicine | Admitting: Internal Medicine

## 2024-04-22 ENCOUNTER — Encounter (HOSPITAL_COMMUNITY): Payer: Self-pay

## 2024-04-22 ENCOUNTER — Inpatient Hospital Stay: Attending: Internal Medicine

## 2024-04-22 DIAGNOSIS — C3490 Malignant neoplasm of unspecified part of unspecified bronchus or lung: Secondary | ICD-10-CM | POA: Insufficient documentation

## 2024-04-22 DIAGNOSIS — C3412 Malignant neoplasm of upper lobe, left bronchus or lung: Secondary | ICD-10-CM | POA: Insufficient documentation

## 2024-04-22 DIAGNOSIS — E162 Hypoglycemia, unspecified: Secondary | ICD-10-CM | POA: Diagnosis not present

## 2024-04-22 DIAGNOSIS — Y842 Radiological procedure and radiotherapy as the cause of abnormal reaction of the patient, or of later complication, without mention of misadventure at the time of the procedure: Secondary | ICD-10-CM | POA: Insufficient documentation

## 2024-04-22 DIAGNOSIS — Z9181 History of falling: Secondary | ICD-10-CM | POA: Diagnosis not present

## 2024-04-22 DIAGNOSIS — R0609 Other forms of dyspnea: Secondary | ICD-10-CM | POA: Insufficient documentation

## 2024-04-22 DIAGNOSIS — R238 Other skin changes: Secondary | ICD-10-CM | POA: Diagnosis not present

## 2024-04-22 LAB — CBC WITH DIFFERENTIAL (CANCER CENTER ONLY)
Abs Immature Granulocytes: 0.02 K/uL (ref 0.00–0.07)
Basophils Absolute: 0 K/uL (ref 0.0–0.1)
Basophils Relative: 0 %
Eosinophils Absolute: 0.1 K/uL (ref 0.0–0.5)
Eosinophils Relative: 2 %
HCT: 35.2 % — ABNORMAL LOW (ref 36.0–46.0)
Hemoglobin: 12.3 g/dL (ref 12.0–15.0)
Immature Granulocytes: 0 %
Lymphocytes Relative: 26 %
Lymphs Abs: 1.2 K/uL (ref 0.7–4.0)
MCH: 35.4 pg — ABNORMAL HIGH (ref 26.0–34.0)
MCHC: 34.9 g/dL (ref 30.0–36.0)
MCV: 101.4 fL — ABNORMAL HIGH (ref 80.0–100.0)
Monocytes Absolute: 0.4 K/uL (ref 0.1–1.0)
Monocytes Relative: 8 %
Neutro Abs: 3 K/uL (ref 1.7–7.7)
Neutrophils Relative %: 64 %
Platelet Count: 116 K/uL — ABNORMAL LOW (ref 150–400)
RBC: 3.47 MIL/uL — ABNORMAL LOW (ref 3.87–5.11)
RDW: 13.5 % (ref 11.5–15.5)
WBC Count: 4.7 K/uL (ref 4.0–10.5)
nRBC: 0 % (ref 0.0–0.2)

## 2024-04-22 LAB — CMP (CANCER CENTER ONLY)
ALT: 6 U/L (ref 0–44)
AST: 14 U/L — ABNORMAL LOW (ref 15–41)
Albumin: 4.4 g/dL (ref 3.5–5.0)
Alkaline Phosphatase: 50 U/L (ref 38–126)
Anion gap: 9 (ref 5–15)
BUN: 19 mg/dL (ref 8–23)
CO2: 25 mmol/L (ref 22–32)
Calcium: 9.7 mg/dL (ref 8.9–10.3)
Chloride: 106 mmol/L (ref 98–111)
Creatinine: 1.37 mg/dL — ABNORMAL HIGH (ref 0.44–1.00)
GFR, Estimated: 38 mL/min — ABNORMAL LOW (ref >60)
Glucose, Bld: 76 mg/dL (ref 70–99)
Potassium: 3.9 mmol/L (ref 3.5–5.1)
Sodium: 140 mmol/L (ref 135–145)
Total Bilirubin: 0.6 mg/dL (ref 0.0–1.2)
Total Protein: 6.7 g/dL (ref 6.5–8.1)

## 2024-04-22 MED ORDER — SODIUM CHLORIDE (PF) 0.9 % IJ SOLN
INTRAMUSCULAR | Status: AC
  Filled 2024-04-22: qty 50

## 2024-04-22 MED ORDER — IOHEXOL 300 MG/ML  SOLN
75.0000 mL | Freq: Once | INTRAMUSCULAR | Status: AC | PRN
  Administered 2024-04-22: 50 mL via INTRAVENOUS

## 2024-04-29 ENCOUNTER — Inpatient Hospital Stay: Admitting: Internal Medicine

## 2024-04-29 VITALS — BP 134/86 | HR 100 | Temp 97.9°F | Resp 17 | Ht 59.0 in | Wt 156.0 lb

## 2024-04-29 DIAGNOSIS — C3412 Malignant neoplasm of upper lobe, left bronchus or lung: Secondary | ICD-10-CM

## 2024-04-29 DIAGNOSIS — C3490 Malignant neoplasm of unspecified part of unspecified bronchus or lung: Secondary | ICD-10-CM
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
## 2024-05-01 ENCOUNTER — Other Ambulatory Visit: Payer: Self-pay

## 2024-05-01 DIAGNOSIS — F419 Anxiety disorder, unspecified: Secondary | ICD-10-CM

## 2024-05-01 DIAGNOSIS — F32A Depression, unspecified: Secondary | ICD-10-CM

## 2024-05-01 MED ORDER — DIVALPROEX SODIUM 125 MG PO DR TAB: 250.0000 mg | DELAYED_RELEASE_TABLET | Freq: Two times a day (BID) | ORAL | 2 refills | Status: AC

## 2024-05-02 ENCOUNTER — Telehealth: Payer: Self-pay | Admitting: Internal Medicine

## 2024-05-02 NOTE — Telephone Encounter (Signed)
 Left the patient a voicemail with the scheduled appointment details per LOS notes.

## 2024-05-20 ENCOUNTER — Other Ambulatory Visit: Payer: Self-pay | Admitting: Student

## 2024-05-20 ENCOUNTER — Encounter: Payer: Self-pay | Admitting: Student

## 2024-05-20 ENCOUNTER — Ambulatory Visit (INDEPENDENT_AMBULATORY_CARE_PROVIDER_SITE_OTHER): Admitting: Student

## 2024-05-20 VITALS — BP 138/70 | HR 92 | Wt 155.0 lb

## 2024-05-20 DIAGNOSIS — N644 Mastodynia: Secondary | ICD-10-CM

## 2024-05-20 DIAGNOSIS — N1832 Chronic kidney disease, stage 3b: Secondary | ICD-10-CM | POA: Diagnosis not present

## 2024-05-20 NOTE — Patient Instructions (Addendum)
 It was great to see you today!   I have ordered a mammogram. The breast center will call you to schedule an appointment.   I am checking your kidney function today. I will call you with results.   Future Appointments  Date Time Provider Department Center  07/14/2024  3:00 PM [facility] ANNUAL FERDIE VISIT [facility]  10/20/2024  9:45 AM [facility] LAB [facility] None  10/27/2024  9:45 AM Sherrod Sherrod, MD [facility] None    Please arrive 15 minutes before your appointment to ensure smooth check in process.  If you are more than 15 minutes late, you may be asked to reschedule.   Please bring a list of your medications with you to all appointments.   Please call the clinic at [phone] if your symptoms worsen or you have any concerns.  Thank you for allowing me to participate in your care, Dr. Damien Pinal [facility]

## 2024-05-20 NOTE — Progress Notes (Signed)
    SUBJECTIVE:   CHIEF COMPLAINT / HPI:   [name] [name] is an 83 year old female who presents for breast pain and lung cancer follow up.   Recent fall and hypoglycemia - Recent fall resulting in hospitalization for one week. - Significant hypoglycemia identified as the cause of the fall. - Medication schedule adjusted following hospitalization. - no recent falls at home  - denies symptoms of hypoglycemia   Lung Caner:  - Established with Dr. Sherrod  - Following with him for surveillance    Left breast pain with worsening lymph nodes  - noted for several months-years  - concerned due to recent cancer diagnosis  - denies nipple discharge   Environmental stressors - Temporarily residing at granddaughter's house due to mold issues in her own home. - Feeling overwhelmed by current living situation.  PERTINENT  PMH / PSH: reviewed and updated.  OBJECTIVE:   BP 138/70   Pulse 92   Wt 155 lb (70.3 kg)   SpO2 96%   BMI 31.31 kg/m   Well-appearing, no acute distress, ambulates with walker  Cardio: Regular rate, regular rhythm, no murmurs on exam. Pulm: Clear, no wheezing, no crackles. No increased work of breathing Abdominal: bowel sounds present, soft, non-tender, non-distended Extremities: no peripheral edema   ASSESSMENT/PLAN:   Assessment & Plan CKD stage 3b, GFR 30-44 ml/min (HCC) Recheck BMP today  Is not uremic on exam Breast pain, left Order diagnostic mammogram  If mammogram WNL consider referral to lymphedema clinic for symptomatic management      Damien Pinal, DO [facility]

## 2024-05-20 NOTE — Assessment & Plan Note (Signed)
 Recheck BMP today  Is not uremic on exam

## 2024-05-21 LAB — BASIC METABOLIC PANEL WITH GFR
BUN/Creatinine Ratio: 15 (ref 12–28)
BUN: 20 mg/dL (ref 8–27)
CO2: 18 mmol/L — ABNORMAL LOW (ref 20–29)
Calcium: 8.9 mg/dL (ref 8.7–10.3)
Chloride: 107 mmol/L — ABNORMAL HIGH (ref 96–106)
Creatinine, Ser: 1.37 mg/dL — ABNORMAL HIGH (ref 0.57–1.00)
Glucose: 79 mg/dL (ref 70–99)
Potassium: 4.2 mmol/L (ref 3.5–5.2)
Sodium: 140 mmol/L (ref 134–144)
eGFR: 38 mL/min/1.73 — ABNORMAL LOW (ref >59)

## 2024-05-22 ENCOUNTER — Ambulatory Visit: Payer: Self-pay | Admitting: Student

## 2024-05-22 NOTE — Telephone Encounter (Signed)
 Called patient to discuss lab results.   Cr remains stable at 1.37. Went over patient's medications. She is taking Lasix  20 mg daily instead of prescribed every other day. Reviewed her chart and she does not have documented HFrEF. She increased taking lasix  to daily due to her having more fluid on her from the cancer treatment.   Discussed cutting back on Lasix  20 mg. Will recheck BMP in 3 months.  Reviewed other home medications which appeared appropriate for her renal function.   Damien Pinal, DO [facility], PGY-3 05/22/24 8:17 AM

## 2024-06-04 ENCOUNTER — Observation Stay (HOSPITAL_COMMUNITY)
Admission: EM | Admit: 2024-06-04 | Discharge: 2024-06-12 | Disposition: A | Discharge status: Skilled Nursing Facility | Attending: Internal Medicine | Admitting: Internal Medicine

## 2024-06-04 ENCOUNTER — Emergency Department (HOSPITAL_COMMUNITY)

## 2024-06-04 ENCOUNTER — Other Ambulatory Visit: Payer: Self-pay

## 2024-06-04 ENCOUNTER — Observation Stay (HOSPITAL_COMMUNITY)

## 2024-06-04 ENCOUNTER — Encounter (HOSPITAL_COMMUNITY): Payer: Self-pay

## 2024-06-04 DIAGNOSIS — J449 Chronic obstructive pulmonary disease, unspecified: Secondary | ICD-10-CM | POA: Diagnosis not present

## 2024-06-04 DIAGNOSIS — R7989 Other specified abnormal findings of blood chemistry: Secondary | ICD-10-CM | POA: Diagnosis not present

## 2024-06-04 DIAGNOSIS — I129 Hypertensive chronic kidney disease with stage 1 through stage 4 chronic kidney disease, or unspecified chronic kidney disease: Secondary | ICD-10-CM | POA: Diagnosis not present

## 2024-06-04 DIAGNOSIS — R197 Diarrhea, unspecified: Secondary | ICD-10-CM | POA: Insufficient documentation

## 2024-06-04 DIAGNOSIS — E860 Dehydration: Principal | ICD-10-CM

## 2024-06-04 DIAGNOSIS — Z8679 Personal history of other diseases of the circulatory system: Secondary | ICD-10-CM | POA: Insufficient documentation

## 2024-06-04 DIAGNOSIS — Z91040 Latex allergy status: Secondary | ICD-10-CM | POA: Insufficient documentation

## 2024-06-04 DIAGNOSIS — N179 Acute kidney failure, unspecified: Principal | ICD-10-CM | POA: Diagnosis present

## 2024-06-04 DIAGNOSIS — M4722 Other spondylosis with radiculopathy, cervical region: Secondary | ICD-10-CM

## 2024-06-04 DIAGNOSIS — F32A Depression, unspecified: Secondary | ICD-10-CM | POA: Diagnosis not present

## 2024-06-04 DIAGNOSIS — Z79899 Other long term (current) drug therapy: Secondary | ICD-10-CM | POA: Diagnosis not present

## 2024-06-04 DIAGNOSIS — R112 Nausea with vomiting, unspecified: Secondary | ICD-10-CM

## 2024-06-04 DIAGNOSIS — Z87891 Personal history of nicotine dependence: Secondary | ICD-10-CM | POA: Insufficient documentation

## 2024-06-04 DIAGNOSIS — N1831 Chronic kidney disease, stage 3a: Secondary | ICD-10-CM | POA: Diagnosis not present

## 2024-06-04 DIAGNOSIS — Z7901 Long term (current) use of anticoagulants: Secondary | ICD-10-CM | POA: Diagnosis not present

## 2024-06-04 DIAGNOSIS — R531 Weakness: Secondary | ICD-10-CM | POA: Diagnosis not present

## 2024-06-04 DIAGNOSIS — N390 Urinary tract infection, site not specified: Secondary | ICD-10-CM | POA: Diagnosis not present

## 2024-06-04 DIAGNOSIS — M25551 Pain in right hip: Secondary | ICD-10-CM | POA: Diagnosis not present

## 2024-06-04 DIAGNOSIS — D61818 Other pancytopenia: Secondary | ICD-10-CM | POA: Diagnosis not present

## 2024-06-04 DIAGNOSIS — C3490 Malignant neoplasm of unspecified part of unspecified bronchus or lung: Secondary | ICD-10-CM | POA: Diagnosis not present

## 2024-06-04 DIAGNOSIS — M5416 Radiculopathy, lumbar region: Secondary | ICD-10-CM

## 2024-06-04 LAB — IRON AND TIBC
Iron: 75 ug/dL (ref 28–170)
Saturation Ratios: 20 % (ref 10.4–31.8)
TIBC: 384 ug/dL (ref 250–450)
UIBC: 308 ug/dL

## 2024-06-04 LAB — RETICULOCYTES
Immature Retic Fract: 19.2 % — ABNORMAL HIGH (ref 2.3–15.9)
RBC.: 3.02 MIL/uL — ABNORMAL LOW (ref 3.87–5.11)
Retic Count, Absolute: 96.9 K/uL (ref 19.0–186.0)
Retic Ct Pct: 3.2 % — ABNORMAL HIGH (ref 0.4–3.1)

## 2024-06-04 LAB — COMPREHENSIVE METABOLIC PANEL WITH GFR
ALT: 7 U/L (ref 0–44)
AST: 21 U/L (ref 15–41)
Albumin: 4.7 g/dL (ref 3.5–5.0)
Alkaline Phosphatase: 57 U/L (ref 38–126)
Anion gap: 15 (ref 5–15)
BUN: 30 mg/dL — ABNORMAL HIGH (ref 8–23)
CO2: 21 mmol/L — ABNORMAL LOW (ref 22–32)
Calcium: 9.3 mg/dL (ref 8.9–10.3)
Chloride: 103 mmol/L (ref 98–111)
Creatinine, Ser: 1.73 mg/dL — ABNORMAL HIGH (ref 0.44–1.00)
GFR, Estimated: 29 mL/min — ABNORMAL LOW (ref >60)
Glucose, Bld: 77 mg/dL (ref 70–99)
Potassium: 3.9 mmol/L (ref 3.5–5.1)
Sodium: 139 mmol/L (ref 135–145)
Total Bilirubin: <0.2 mg/dL (ref 0.0–1.2)
Total Protein: 7.2 g/dL (ref 6.5–8.1)

## 2024-06-04 LAB — URINALYSIS, ROUTINE W REFLEX MICROSCOPIC
Bilirubin Urine: NEGATIVE
Glucose, UA: NEGATIVE mg/dL
Hgb urine dipstick: NEGATIVE
Ketones, ur: 5 mg/dL — AB
Nitrite: NEGATIVE
Protein, ur: NEGATIVE mg/dL
Specific Gravity, Urine: 1.01 (ref 1.005–1.030)
pH: 6 (ref 5.0–8.0)

## 2024-06-04 LAB — CBC WITH DIFFERENTIAL/PLATELET
Abs Immature Granulocytes: 0.01 K/uL (ref 0.00–0.07)
Basophils Absolute: 0 K/uL (ref 0.0–0.1)
Basophils Relative: 0 %
Eosinophils Absolute: 0 K/uL (ref 0.0–0.5)
Eosinophils Relative: 1 %
HCT: 35.4 % — ABNORMAL LOW (ref 36.0–46.0)
Hemoglobin: 11.9 g/dL — ABNORMAL LOW (ref 12.0–15.0)
Immature Granulocytes: 0 %
Lymphocytes Relative: 30 %
Lymphs Abs: 0.9 K/uL (ref 0.7–4.0)
MCH: 36.3 pg — ABNORMAL HIGH (ref 26.0–34.0)
MCHC: 33.6 g/dL (ref 30.0–36.0)
MCV: 107.9 fL — ABNORMAL HIGH (ref 80.0–100.0)
Monocytes Absolute: 0.3 K/uL (ref 0.1–1.0)
Monocytes Relative: 9 %
Neutro Abs: 1.7 K/uL (ref 1.7–7.7)
Neutrophils Relative %: 60 %
Platelets: 128 K/uL — ABNORMAL LOW (ref 150–400)
RBC: 3.28 MIL/uL — ABNORMAL LOW (ref 3.87–5.11)
RDW: 14.8 % (ref 11.5–15.5)
WBC: 3 K/uL — ABNORMAL LOW (ref 4.0–10.5)
nRBC: 0 % (ref 0.0–0.2)

## 2024-06-04 LAB — FERRITIN: Ferritin: 34 ng/mL (ref 11–307)

## 2024-06-04 LAB — MAGNESIUM: Magnesium: 2.3 mg/dL (ref 1.7–2.4)

## 2024-06-04 LAB — VITAMIN B12: Vitamin B-12: 589 pg/mL (ref 180–914)

## 2024-06-04 LAB — TROPONIN T, HIGH SENSITIVITY
Troponin T High Sensitivity: 29 ng/L — ABNORMAL HIGH (ref 0–19)
Troponin T High Sensitivity: 26 ng/L — ABNORMAL HIGH (ref 0–19)

## 2024-06-04 LAB — PHOSPHORUS: Phosphorus: 2.9 mg/dL (ref 2.5–4.6)

## 2024-06-04 LAB — TSH: TSH: 0.614 u[IU]/mL (ref 0.350–4.500)

## 2024-06-04 LAB — FOLATE: Folate: 11.7 ng/mL (ref >5.9)

## 2024-06-04 LAB — LIPASE, BLOOD: Lipase: 52 U/L — ABNORMAL HIGH (ref 11–51)

## 2024-06-04 MED ORDER — SODIUM CHLORIDE 0.9 % IV SOLN: INTRAVENOUS | Status: AC

## 2024-06-04 MED ORDER — BUSPIRONE HCL 5 MG PO TABS
10.0000 mg | ORAL_TABLET | Freq: Three times a day (TID) | ORAL | Status: DC
  Administered 2024-06-04 – 2024-06-12 (×23): 10 mg via ORAL
  Filled 2024-06-04 (×24): qty 2

## 2024-06-04 MED ORDER — ALBUTEROL SULFATE (2.5 MG/3ML) 0.083% IN NEBU: 3.0000 mL | INHALATION_SOLUTION | Freq: Four times a day (QID) | RESPIRATORY_TRACT | Status: DC | PRN

## 2024-06-04 MED ORDER — LORAZEPAM 0.5 MG PO TABS
0.5000 mg | ORAL_TABLET | Freq: Four times a day (QID) | ORAL | Status: DC | PRN
  Administered 2024-06-05 – 2024-06-12 (×3): 0.5 mg via ORAL
  Filled 2024-06-04 (×3): qty 1

## 2024-06-04 MED ORDER — CITALOPRAM HYDROBROMIDE 20 MG PO TABS
20.0000 mg | ORAL_TABLET | Freq: Every day | ORAL | Status: DC
  Administered 2024-06-04 – 2024-06-11 (×8): 20 mg via ORAL
  Filled 2024-06-04 (×9): qty 1

## 2024-06-04 MED ORDER — LACTATED RINGERS IV BOLUS
500.0000 mL | Freq: Once | INTRAVENOUS | Status: AC
  Administered 2024-06-04: 500 mL via INTRAVENOUS

## 2024-06-04 MED ORDER — DIVALPROEX SODIUM 250 MG PO DR TAB
250.0000 mg | DELAYED_RELEASE_TABLET | Freq: Two times a day (BID) | ORAL | Status: DC
  Administered 2024-06-04 – 2024-06-12 (×16): 250 mg via ORAL
  Filled 2024-06-04 (×16): qty 1

## 2024-06-04 MED ORDER — PANTOPRAZOLE SODIUM 40 MG PO TBEC
40.0000 mg | DELAYED_RELEASE_TABLET | Freq: Two times a day (BID) | ORAL | Status: DC
  Administered 2024-06-04 – 2024-06-12 (×16): 40 mg via ORAL
  Filled 2024-06-04 (×16): qty 1

## 2024-06-04 MED ORDER — SALINE SPRAY 0.65 % NA SOLN: 1.0000 | Freq: Two times a day (BID) | NASAL | Status: DC | PRN

## 2024-06-04 MED ORDER — ENOXAPARIN SODIUM 30 MG/0.3ML IJ SOSY
30.0000 mg | PREFILLED_SYRINGE | INTRAMUSCULAR | Status: DC
  Administered 2024-06-04 – 2024-06-07 (×4): 30 mg via SUBCUTANEOUS
  Filled 2024-06-04 (×4): qty 0.3

## 2024-06-04 MED ORDER — NASAL MOIST NA GEL: 1.0000 | Freq: Two times a day (BID) | NASAL | Status: DC | PRN

## 2024-06-04 MED ORDER — ACETAMINOPHEN 325 MG PO TABS
650.0000 mg | ORAL_TABLET | Freq: Four times a day (QID) | ORAL | Status: DC | PRN
  Administered 2024-06-04 – 2024-06-12 (×8): 650 mg via ORAL
  Filled 2024-06-04 (×9): qty 2

## 2024-06-04 MED ORDER — ONDANSETRON HCL 4 MG PO TABS: 4.0000 mg | ORAL_TABLET | Freq: Four times a day (QID) | ORAL | Status: DC | PRN

## 2024-06-04 MED ORDER — POLYVINYL ALCOHOL 1.4 % OP SOLN: 1.0000 [drp] | Freq: Four times a day (QID) | OPHTHALMIC | Status: DC | PRN

## 2024-06-04 MED ORDER — UMECLIDINIUM-VILANTEROL 62.5-25 MCG/ACT IN AEPB
1.0000 | INHALATION_SPRAY | Freq: Every day | RESPIRATORY_TRACT | Status: DC
  Administered 2024-06-05 – 2024-06-12 (×8): 1 via RESPIRATORY_TRACT
  Filled 2024-06-04 (×2): qty 14

## 2024-06-04 MED ORDER — ONDANSETRON HCL 4 MG/2ML IJ SOLN
4.0000 mg | Freq: Four times a day (QID) | INTRAMUSCULAR | Status: DC | PRN
  Administered 2024-06-04: 4 mg via INTRAVENOUS
  Filled 2024-06-04: qty 2

## 2024-06-04 MED ORDER — ACETAMINOPHEN 650 MG RE SUPP: 650.0000 mg | Freq: Four times a day (QID) | RECTAL | Status: DC | PRN

## 2024-06-04 MED ORDER — SODIUM CHLORIDE 0.9 % IV SOLN
1.0000 g | INTRAVENOUS | Status: AC
  Administered 2024-06-04 – 2024-06-06 (×3): 1 g via INTRAVENOUS
  Filled 2024-06-04 (×3): qty 10

## 2024-06-04 MED ORDER — CLOPIDOGREL BISULFATE 75 MG PO TABS
75.0000 mg | ORAL_TABLET | Freq: Every day | ORAL | Status: DC
  Administered 2024-06-05 – 2024-06-12 (×8): 75 mg via ORAL
  Filled 2024-06-04 (×8): qty 1

## 2024-06-04 MED ORDER — TRAMADOL HCL 50 MG PO TABS
50.0000 mg | ORAL_TABLET | Freq: Four times a day (QID) | ORAL | Status: DC | PRN
  Administered 2024-06-05 – 2024-06-06 (×2): 50 mg via ORAL
  Filled 2024-06-04 (×2): qty 1

## 2024-06-04 MED ORDER — LACTATED RINGERS IV SOLN: INTRAVENOUS | Status: DC

## 2024-06-04 MED ORDER — MELATONIN 5 MG PO TABS
5.0000 mg | ORAL_TABLET | Freq: Every evening | ORAL | Status: DC | PRN
  Administered 2024-06-04 – 2024-06-10 (×3): 5 mg via ORAL
  Filled 2024-06-04 (×3): qty 1

## 2024-06-04 MED ORDER — GABAPENTIN 100 MG PO CAPS
100.0000 mg | ORAL_CAPSULE | Freq: Three times a day (TID) | ORAL | Status: DC
  Administered 2024-06-04 – 2024-06-09 (×14): 100 mg via ORAL
  Filled 2024-06-04 (×14): qty 1

## 2024-06-04 NOTE — ED Triage Notes (Signed)
 Pt BIB ems for N/V/D for 1 week and weakness due to that. Had vomiting this morning and last episode of diarrhea last night. Thinks she may have a UTI again. A&Ox4, VS stable.
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
## 2024-06-04 NOTE — ED Provider Notes (Signed)
 [facility] EMERGENCY DEPARTMENT AT [facility] Provider Note   CSN: [id number] Arrival date & time: 06/04/24  1137     Patient presents with: Weakness, Emesis, and Nausea   [name] [name] is a 83 y.o. adult.   HPI Patient reports she is getting increasingly weak and has not been able to eat much for about a week.  She reports she tried to eat some bread to settle her stomach this morning and drink some Pepsi and then immediately vomited again.  Patient reports she has had fairly aggressive diarrhea that is waxing and waning.  She reports at the end of last week she was having 4-5 bowel movements per day.  It seemed to ease off after several days but now the past couple days she has had increased frequency of bowel movement again.  Patient reports she has been unable to cook or really care for herself in her home very well for several months.  She has been depending on Meals on Wheels.  She reports when they visited today they were concerned about her general condition and weakness and advised her to come to the emergency department to get checked.  Patient reports that her daughter used to live with her and help her but her daughter passed away and subsequent to that she has grandchildren but reports that they are not very reliable for helping her in her home.    Prior to Admission medications   Medication Sig Start Date End Date Taking? Authorizing Provider  acetaminophen  (TYLENOL ) 500 MG tablet Take 500 mg by mouth every 8 (eight) hours as needed for moderate pain.    [provider]  albuterol  (VENTOLIN  HFA) 108 (90 Base) MCG/ACT inhaler Inhale 1 puff into the lungs daily as needed for shortness of breath. 09/01/22   [provider]  amLODipine  (NORVASC ) 2.5 MG tablet TAKE 1 TABLET BY MOUTH DAILY 04/15/24   [name], DO  busPIRone  (BUSPAR ) 10 MG tablet Take 1 tablet (10 mg total) by mouth 3 (three) times daily. 03/11/24 06/09/24  [place], Eric J, DO  citalopram   (CELEXA ) 20 MG tablet Take 1 tablet (20 mg total) by mouth daily. 11/06/23   [name], DO  clopidogrel  (PLAVIX ) 75 MG tablet TAKE 1 TABLET BY MOUTH DAILY 06/20/23   Rosendo Rush, MD  divalproex  (DEPAKOTE ) 125 MG DR tablet Take 2 tablets (250 mg total) by mouth 2 (two) times daily. 05/01/24   [name], DO  furosemide  (LASIX ) 20 MG tablet Take 20 mg by mouth every other day. 07/17/22   [provider]  gabapentin  (NEURONTIN ) 100 MG capsule Take 1 capsule (100 mg total) by mouth 3 (three) times daily. 02/04/24   [name], DO  LORazepam  (ATIVAN ) 0.5 MG tablet Take 1 tablet (0.5 mg total) by mouth every 6 (six) hours as needed for anxiety. 03/11/24   [place], Eric J, DO  Nystatin  (GERHARDT'S BUTT CREAM) CREA Apply 1 Application topically 2 (two) times daily. 08/08/23   [name], DO  nystatin  (MYCOSTATIN /NYSTOP ) powder Apply 1 Application topically 3 (three) times daily. Under the breast 11/06/23   [name], DO  nystatin -triamcinolone  ointment (MYCOLOG) Apply 1 Application topically 2 (two) times daily. Under the breasts 11/06/23   [name], DO  pantoprazole  (PROTONIX ) 40 MG tablet TAKE 1 TABLET BY MOUTH 2 TIMES A DAY 01/14/24   [name], DO  Propyl Glycol-Hydroxyethylcell (NASAL MOIST) GEL Apply after using nasal saline spray Patient taking differently: Place 1 spray into both nostrils as  needed. Apply after using nasal saline spray 10/30/22   Macario Dorothyann HERO, MD  sodium chloride  (OCEAN) 0.65 % SOLN nasal spray Use twice daily to keep nose moist. Patient taking differently: Place 1 spray into both nostrils as needed (Keep nose moist). 10/30/22   Macario Dorothyann HERO, MD  traMADol  (ULTRAM ) 50 MG tablet Take 1 tablet (50 mg total) by mouth every 6 (six) hours as needed. 02/04/24   [name], DO  umeclidinium-vilanterol (ANORO ELLIPTA ) 62.5-25 MCG/ACT AEPB Inhale 1 puff into the lungs daily. 03/19/24   Isadora Hose, MD    Allergies: Statins and Statins    Review of  Systems  Updated Vital Signs BP 138/63 (BP Location: Right Arm)   Pulse 78   Temp 98.4 F (36.9 C) (Oral)   Resp 20   Ht 4' 11 (1.499 m)   Wt 64 kg   SpO2 95%   BMI 28.48 kg/m   Physical Exam  (all labs ordered are listed, but only abnormal results are displayed) Labs Reviewed - No data to display  EKG: None  Radiology: No results found.   Procedures   Medications Ordered in the ED - No data to display                                  Medical Decision Making Amount and/or Complexity of Data Reviewed Labs: ordered. Radiology: ordered.  Risk Decision regarding hospitalization.   Patient presents with increasing general weakness with vomiting and diarrhea.  Will proceed with diagnostic evaluation for infectious source\metabolic derangement.  GFR less than 29 count 3.0 H&H stable  Findings consistent with dehydration.  In the setting of vomiting and diarrhea with increasing weakness and inability to manage her own ADLs.  Will admit for rehydration and further diagnostic testing if needed.       Final diagnoses:  Dehydration  Nausea vomiting and diarrhea  Generalized weakness    ED Discharge Orders     None          Armenta Canning, MD 06/08/24 2338

## 2024-06-04 NOTE — Progress Notes (Signed)
 Patient arrived to unit , VSS, fluids given as ordered, medicated for nausea, fluids encouraged and tolerated well, in bed resting, call light in reach

## 2024-06-05 DIAGNOSIS — R627 Adult failure to thrive: Secondary | ICD-10-CM

## 2024-06-05 LAB — BASIC METABOLIC PANEL WITH GFR
Anion gap: 10 (ref 5–15)
BUN: 23 mg/dL (ref 8–23)
CO2: 21 mmol/L — ABNORMAL LOW (ref 22–32)
Calcium: 8.4 mg/dL — ABNORMAL LOW (ref 8.9–10.3)
Chloride: 111 mmol/L (ref 98–111)
Creatinine, Ser: 1.39 mg/dL — ABNORMAL HIGH (ref 0.44–1.00)
GFR, Estimated: 37 mL/min — ABNORMAL LOW (ref >60)
Glucose, Bld: 71 mg/dL (ref 70–99)
Potassium: 4 mmol/L (ref 3.5–5.1)
Sodium: 142 mmol/L (ref 135–145)

## 2024-06-05 LAB — CBC
HCT: 33.1 % — ABNORMAL LOW (ref 36.0–46.0)
Hemoglobin: 10.9 g/dL — ABNORMAL LOW (ref 12.0–15.0)
MCH: 36.8 pg — ABNORMAL HIGH (ref 26.0–34.0)
MCHC: 32.9 g/dL (ref 30.0–36.0)
MCV: 111.8 fL — ABNORMAL HIGH (ref 80.0–100.0)
Platelets: 117 K/uL — ABNORMAL LOW (ref 150–400)
RBC: 2.96 MIL/uL — ABNORMAL LOW (ref 3.87–5.11)
RDW: 15.3 % (ref 11.5–15.5)
WBC: 3.2 K/uL — ABNORMAL LOW (ref 4.0–10.5)
nRBC: 0 % (ref 0.0–0.2)

## 2024-06-05 MED ORDER — SODIUM CHLORIDE 0.9 % IV SOLN: INTRAVENOUS | Status: AC

## 2024-06-05 NOTE — TOC PASRR Note (Cosign Needed)
 30 Day PASRR Note   Patient Details  Name: [name] [name] Date of Birth: 21-Aug-1940   Transition of Care [facility]) CM/SW Contact:    Bascom Service, RN Phone Number: 06/05/2024, 3:55 PM  To Whom It May Concern:  Please be advised that this patient will require a short-term nursing home stay - anticipated 30 days or less for rehabilitation and strengthening.   The plan is for return home.

## 2024-06-05 NOTE — Plan of Care (Signed)
  Problem: Health Behavior/Discharge Planning: Goal: Ability to manage health-related needs will improve Outcome: Progressing   Problem: Elimination: Goal: Will not experience complications related to urinary retention Outcome: Progressing   Problem: Pain Managment: Goal: General experience of comfort will improve and/or be controlled Outcome: Progressing   Problem: Safety: Goal: Ability to remain free from injury will improve Outcome: Progressing   Problem: Skin Integrity: Goal: Risk for impaired skin integrity will decrease Outcome: Progressing

## 2024-06-05 NOTE — Care Management Obs Status (Signed)
 MEDICARE OBSERVATION STATUS NOTIFICATION   Patient Details  Name: [name] [name] MRN: [id number] Date of Birth: Jan 13, 1941   Medicare Observation Status Notification Given:  Yes    MahabirNathanel, RN 06/05/2024, 3:13 PM

## 2024-06-05 NOTE — Evaluation (Signed)
 Physical Therapy Evaluation Patient Details Name: [name] [name] MRN: [id number] DOB: [birth year] Today's Date: 06/05/2024  History of Present Illness  Patient is an 83 y/o female admitted 06/04/24 due to nausea, vomiting, diarrhea and poor p.o intake.  Found to have positive urinalysis and weakness/FTT.  PMH positive for CKD III (absence of one kidney), depression, HTN, GERD, PVD, PMR, sleep apnea, glaucoma, PNA, lung cancer.  Clinical Impression  Patient presents with decreased mobility due to pain R hip and ankle and significantly decreased activity tolerance, balance and strength.  She was struggling at home for a bit using wheelchair mainly and not able to shower for about a year.  Reports no heat in her home and mice and raccoons eat her food so she gets meals on wheels.  Reports several falls and was not able to effectively use her life alert to call for help.  Feel she is unsafe to return home at this time and pt agreeable to inpatient rehab (<3 hours/day).  PT will continue to follow while admitted.         If plan is discharge home, recommend the following: Two people to help with walking and/or transfers;Help with stairs or ramp for entrance;Assistance with cooking/housework;A lot of help with bathing/dressing/bathroom   Can travel by private vehicle   No    Equipment Recommendations None recommended by PT  Recommendations for Other Services       Functional Status Assessment Patient has had a recent decline in their functional status and demonstrates the ability to make significant improvements in function in a reasonable and predictable amount of time.     Precautions / Restrictions Precautions Precautions: Fall      Mobility  Bed Mobility Overal bed mobility: Needs Assistance Bed Mobility: Supine to Sit, Sit to Supine     Supine to sit: Min assist Sit to supine: Mod assist   General bed mobility comments: assist for R LE and increased time; heavily reliant on UE's to  scoot to EOB; to supine assisted both legs back in bed and helped to adjust trunk    Transfers Overall transfer level: Needs assistance   Transfers: Bed to chair/wheelchair/BSC            Lateral/Scoot Transfers: Min assist General transfer comment: scooting back toward [facility] with min A using L LE to scoot/pivot    Ambulation/Gait               General Gait Details: unable to tolerate or stand on R LE  Stairs            Wheelchair Mobility     Tilt Bed    Modified Rankin (Stroke Patients Only)       Balance Overall balance assessment: Needs assistance Sitting-balance support: Feet supported Sitting balance-Leahy Scale: Fair Sitting balance - Comments: prolonged sitting pt mildly tremulous so rested arms on table; sat to remove dentures to cup to clean and rinsed mouth with water and toothpaste with set up                                     Pertinent Vitals/Pain Pain Assessment Pain Assessment: Faces Faces Pain Scale: Hurts whole lot Pain Location: R hip with mobility Pain Descriptors / Indicators: Aching, Grimacing, Guarding Pain Intervention(s): Monitored during session, Limited activity within patient's tolerance, Repositioned    Home Living Family/patient expects to be discharged to:: Private residence Living Arrangements:  Alone Available Help at Discharge: Neighbor Type of Home: Mobile home Home Access: Ramped entrance       Home Layout: One level Home Equipment: Rollator (4 wheels);BSC/3in1;Shower seat;Wheelchair - manual;Tub bench;Hand held shower head Additional Comments: meals on wheels    Prior Function Prior Level of Function : Needs assist             Mobility Comments: using wheelchair lately due to difficulty walking for 3 weeks ADLs Comments: was sponge bathing recently (for about a year)     Extremity/Trunk Assessment   Upper Extremity Assessment Upper Extremity Assessment: Generalized weakness;LUE  deficits/detail LUE Deficits / Details: L shoulder painful with elevation    Lower Extremity Assessment Lower Extremity Assessment: RLE deficits/detail RLE Deficits / Details: AROM/AAROM limited in abduction, significantly painful with hip flexion and unable to abduct much; ankle AROM/AAROM limited due to pain    Cervical / Trunk Assessment Cervical / Trunk Assessment: Kyphotic  Communication   Communication Communication: No apparent difficulties    Cognition Arousal: Alert Behavior During Therapy: Anxious, Lability   PT - Cognitive impairments: No apparent impairments                         Following commands: Intact (with extra time)       Cueing       General Comments General comments (skin integrity, edema, etc.): reports no heat and states mice, racoons eat her food    Exercises     Assessment/Plan    PT Assessment Patient needs continued PT services  PT Problem List Decreased strength;Decreased activity tolerance;Decreased balance;Pain;Decreased mobility;Decreased range of motion       PT Treatment Interventions DME instruction;Patient/family education;Functional mobility training;Wheelchair mobility training;Therapeutic activities;Therapeutic exercise;Balance training    PT Goals (Current goals can be found in the Care Plan section)  Acute Rehab PT Goals Patient Stated Goal: return to independent living after rehab PT Goal Formulation: With patient Time For Goal Achievement: 06/19/24 Potential to Achieve Goals: Fair    Frequency Min 2X/week     Co-evaluation               AM-PAC PT 6 Clicks Mobility  Outcome Measure Help needed turning from your back to your side while in a flat bed without using bedrails?: A Lot Help needed moving from lying on your back to sitting on the side of a flat bed without using bedrails?: A Lot Help needed moving to and from a bed to a chair (including a wheelchair)?: A Lot Help needed standing up from  a chair using your arms (e.g., wheelchair or bedside chair)?: Total Help needed to walk in hospital room?: Total Help needed climbing 3-5 steps with a railing? : Total 6 Click Score: 9    End of Session   Activity Tolerance: Patient limited by pain Patient left: in bed;with call bell/phone within reach;with bed alarm set   PT Visit Diagnosis: Other abnormalities of gait and mobility (R26.89);History of falling (Z91.81)    Time: 9049-8954 PT Time Calculation (min) (ACUTE ONLY): 55 min   Charges:   PT Evaluation $PT Eval High Complexity: 1 High PT Treatments $Therapeutic Activity: 23-37 mins PT General Charges $$ ACUTE PT VISIT: 1 Visit         Micheline Portal, PT Acute Rehabilitation Services Office:[phone] 06/05/2024   Montie Portal 06/05/2024, 11:25 AM

## 2024-06-05 NOTE — TOC Initial Note (Signed)
 Transition of Care [facility]) - Initial/Assessment Note    Patient Details  Name: CHAKA BOYSON MRN: [id number] Date of Birth: 02/08/1941  [facility]) CM/SW Contact:    Bascom Service, RN Phone Number: 06/05/2024, 3:56 PM  Clinical Narrative: PT recc ST SNF-patient in agreement to ST SNF-prefer [facility] has gone there in past-await pasrr, & bed offers prior auth.                  Expected Discharge Plan: Skilled Nursing Facility Barriers to Discharge: Continued Medical Work up   Patient Goals and CMS Choice Patient states their goals for this hospitalization and ongoing recovery are:: Rehab CMS Medicare.gov Compare Post Acute Care list provided to:: Patient Choice offered to / list presented to : Patient [place] ownership interest in [name].provided to:: Patient    Expected Discharge Plan and Services   Discharge Planning Services: CM Consult Post Acute Care Choice: Skilled Nursing Facility Living arrangements for the past 2 months: Single Family Home                                      Prior Living Arrangements/Services Living arrangements for the past 2 months: Single Family Home Lives with:: Self                   Activities of Daily Living   ADL Screening (condition at time of admission) Independently performs ADLs?: No Does the patient have a NEW difficulty with bathing/dressing/toileting/self-feeding that is expected to last >3 days?: Yes (Initiates electronic notice to provider for possible OT consult) Does the patient have a NEW difficulty with getting in/out of bed, walking, or climbing stairs that is expected to last >3 days?: Yes (Initiates electronic notice to provider for possible PT consult) Does the patient have a NEW difficulty with communication that is expected to last >3 days?: No Is the patient deaf or have difficulty hearing?: No Does the patient have difficulty seeing, even when wearing glasses/contacts?: No Does  the patient have difficulty concentrating, remembering, or making decisions?: No  Permission Sought/Granted                  Emotional Assessment              Admission diagnosis:  Dehydration [E86.0] Generalized weakness [R53.1] AKI (acute kidney injury) [N17.9] Nausea vomiting and diarrhea [R11.2, R19.7] Patient Active Problem List   Diagnosis Date Noted   AKI (acute kidney injury) 06/04/2024   Hypoglycemia 03/05/2024   Generalized weakness 03/05/2024   Hypomagnesemia 03/05/2024   Leukopenia 03/05/2024   Lung cancer (HCC) 03/05/2024   GAD (generalized anxiety disorder) 03/05/2024   Depression 03/05/2024   Primary malignant neoplasm of bronchus of left upper lobe (HCC) 12/26/2023   Lung nodule 12/12/2023   DNR (do not resuscitate) 11/21/2020   Thrombocytopenia 09/02/2020   Recurrent falls 09/07/2019   Dyslipidemia 09/06/2019   Tremor 09/06/2019   Psychosocial stressors 04/03/2018   Congenital absence of one kidney 09/05/2017   Anemia in chronic kidney disease 09/24/2016   CKD stage 3b, GFR 30-44 ml/min (HCC) 09/24/2016   Peripheral vascular disease    Failure to thrive in adult 10/18/2015   Venous stasis dermatitis of both lower extremities 01/18/2015   Radiculopathy of lumbar region 01/06/2015   Seborrheic keratosis 11/24/2014   Nausea & vomiting    OSA (obstructive sleep apnea) 09/21/2014   Osteoporosis  06/29/2014   Thyroid  nodule 06/29/2014   Breast calcifications 06/29/2014   Primary open angle glaucoma (POAG) of both eyes, severe stage 01/08/2014   Basal cell carcinoma of scalp 11/12/2013   Presbyopia 10/22/2013   GERD (gastroesophageal reflux disease) 09/11/2013   Major depressive disorder, recurrent episode, severe (HCC) 05/14/2013   Pulmonary nodules 05/24/2012   Carotid bruit 04/30/2012   Essential hypertension 04/27/2012   Anxiety 04/27/2012   Cervical spondylosis with radiculopathy 04/19/2012   PCP:  [name], DO Pharmacy:   [facility] [id number] - [place], [place] - [address] [place] [address] Phone: [phone] Fax: [phone]     Social Drivers of Health (SDOH) Social History: SDOH Screenings   Food Insecurity: Food Insecurity Present (06/04/2024)  Housing: High Risk (06/04/2024)  Transportation Needs: No Transportation Needs (06/04/2024)  Utilities: Not At Risk (06/04/2024)  Alcohol  Screen: Low Risk  (10/13/2022)  Depression (PHQ2-9): Medium Risk (05/20/2024)  Financial Resource Strain: Low Risk  (10/13/2022)  Physical Activity: Insufficiently Active (10/13/2022)  Social Connections: Moderately Integrated (06/04/2024)  Stress: Stress Concern Present (10/13/2022)  Tobacco Use: Medium Risk (06/04/2024)   SDOH Interventions:     Readmission Risk Interventions    06/08/2023    1:37 PM 07/26/2022   12:20 PM 05/09/2022    3:18 PM  Readmission Risk Prevention Plan  Post Dischage Appt  Complete Complete  Medication Screening  Complete Complete  Transportation Screening Complete Complete   PCP or Specialist Appt within 5-7 Days Complete    Home Care Screening Complete    Medication Review (RN CM) Complete

## 2024-06-05 NOTE — NC FL2 (Signed)
 [place]  MEDICAID FL2 LEVEL OF CARE FORM     IDENTIFICATION  Patient Name: [name] [name] Birthdate: [birth year] Sex: adult Admission Date (Current Location): 06/04/2024  [facility] and [place] Number:  Producer, Television/film/video and Address:  [facility],  [address], [place] [address]      Provider Number: [id number]  Attending Physician Name and Address:  Jillian Buttery, MD  Relative Name and Phone Number:  Sidra Hunt(Grandson)[phone]    Current Level of Care: Hospital Recommended Level of Care: Skilled Nursing Facility Prior Approval Number:    Date Approved/Denied:   PASRR Number:    Discharge Plan: SNF    Current Diagnoses: Patient Active Problem List   Diagnosis Date Noted   AKI (acute kidney injury) 06/04/2024   Hypoglycemia 03/05/2024   Generalized weakness 03/05/2024   Hypomagnesemia 03/05/2024   Leukopenia 03/05/2024   Lung cancer (HCC) 03/05/2024   GAD (generalized anxiety disorder) 03/05/2024   Depression 03/05/2024   Primary malignant neoplasm of bronchus of left upper lobe (HCC) 12/26/2023   Lung nodule 12/12/2023   DNR (do not resuscitate) 11/21/2020   Thrombocytopenia 09/02/2020   Recurrent falls 09/07/2019   Dyslipidemia 09/06/2019   Tremor 09/06/2019   Psychosocial stressors 04/03/2018   Congenital absence of one kidney 09/05/2017   Anemia in chronic kidney disease 09/24/2016   CKD stage 3b, GFR 30-44 ml/min (HCC) 09/24/2016   Peripheral vascular disease    Failure to thrive in adult 10/18/2015   Venous stasis dermatitis of both lower extremities 01/18/2015   Radiculopathy of lumbar region 01/06/2015   Seborrheic keratosis 11/24/2014   Nausea & vomiting    OSA (obstructive sleep apnea) 09/21/2014   Osteoporosis 06/29/2014   Thyroid  nodule 06/29/2014   Breast calcifications 06/29/2014   Primary open angle glaucoma (POAG) of both eyes, severe stage 01/08/2014   Basal cell carcinoma of scalp 11/12/2013   Presbyopia 10/22/2013    GERD (gastroesophageal reflux disease) 09/11/2013   Major depressive disorder, recurrent episode, severe (HCC) 05/14/2013   Pulmonary nodules 05/24/2012   Carotid bruit 04/30/2012   Essential hypertension 04/27/2012   Anxiety 04/27/2012   Cervical spondylosis with radiculopathy 04/19/2012    Orientation RESPIRATION BLADDER Height & Weight     Self, Time, Situation, Place  O2 Continent Weight: 64 kg Height:  4' 11 (149.9 cm)  BEHAVIORAL SYMPTOMS/MOOD NEUROLOGICAL BOWEL NUTRITION STATUS      Continent Diet (Soft)  AMBULATORY STATUS COMMUNICATION OF NEEDS Skin   Limited Assist Verbally Bruising (gen'l bruising arms)                       Personal Care Assistance Level of Assistance  Bathing, Feeding, Dressing Bathing Assistance: Limited assistance   Dressing Assistance: Limited assistance     Functional Limitations Info  Sight, Hearing, Speech Sight Info: Impaired (eyeglasses) Hearing Info: Adequate Speech Info: Impaired (dentures-top/bottom)    SPECIAL CARE FACTORS FREQUENCY  PT (By licensed PT), OT (By licensed OT)     PT Frequency: 5x week OT Frequency: 5x week            Contractures Contractures Info: Not present    Additional Factors Info  Code Status, Allergies Code Status Info: DNR Allergies Info: Tape, Cyclobenzaprine , Latex, Statins           Current Medications (06/05/2024):  This is the current hospital active medication list Current Facility-Administered Medications  Medication Dose Route Frequency Provider Last Rate Last Admin   0.9 %  sodium chloride  infusion   Intravenous Continuous Gherghe, Costin M, MD 100 mL/hr at 06/04/24 2035 New Bag at 06/04/24 2035   acetaminophen  (TYLENOL ) tablet 650 mg  650 mg Oral Q6H PRN Gherghe, Costin M, MD   650 mg at 06/05/24 9047   Or   acetaminophen  (TYLENOL ) suppository 650 mg  650 mg Rectal Q6H PRN Gherghe, Costin M, MD       albuterol  (PROVENTIL ) (2.5 MG/3ML) 0.083% nebulizer solution 3 mL  3 mL  Nebulization Q6H PRN Gherghe, Costin M, MD       artificial tears ophthalmic solution 1-2 drop  1-2 drop Both Eyes Q6H PRN Gherghe, Costin M, MD       busPIRone  (BUSPAR ) tablet 10 mg  10 mg Oral TID Gherghe, Costin M, MD   10 mg at 06/05/24 0943   cefTRIAXone  (ROCEPHIN ) 1 g in sodium chloride  0.9 % 100 mL IVPB  1 g Intravenous Q24H Gherghe, Costin M, MD 200 mL/hr at 06/04/24 1858 1 g at 06/04/24 1858   citalopram  (CELEXA ) tablet 20 mg  20 mg Oral QHS Gherghe, Costin M, MD   20 mg at 06/04/24 2151   clopidogrel  (PLAVIX ) tablet 75 mg  75 mg Oral Daily Gherghe, Costin M, MD   75 mg at 06/05/24 9056   divalproex  (DEPAKOTE ) DR tablet 250 mg  250 mg Oral BID Gherghe, Costin M, MD   250 mg at 06/05/24 9056   enoxaparin  (LOVENOX ) injection 30 mg  30 mg Subcutaneous Q24H Gherghe, Costin M, MD   30 mg at 06/04/24 2151   gabapentin  (NEURONTIN ) capsule 100 mg  100 mg Oral TID Gherghe, Costin M, MD   100 mg at 06/05/24 9056   LORazepam  (ATIVAN ) tablet 0.5 mg  0.5 mg Oral Q6H PRN Gherghe, Costin M, MD       melatonin tablet 5 mg  5 mg Oral QHS PRN Chavez, Abigail, NP   5 mg at 06/04/24 2151   Nasal Moist GEL 1 spray  1 spray Each Nare BID PRN Gherghe, Costin M, MD       ondansetron  (ZOFRAN ) tablet 4 mg  4 mg Oral Q6H PRN Gherghe, Costin M, MD       Or   ondansetron  (ZOFRAN ) injection 4 mg  4 mg Intravenous Q6H PRN Gherghe, Costin M, MD   4 mg at 06/04/24 1857   pantoprazole  (PROTONIX ) EC tablet 40 mg  40 mg Oral BID Gherghe, Costin M, MD   40 mg at 06/05/24 0943   sodium chloride  (OCEAN) 0.65 % nasal spray 1 spray  1 spray Each Nare BID PRN Gherghe, Costin M, MD       traMADol  (ULTRAM ) tablet 50 mg  50 mg Oral Q6H PRN Gherghe, Costin M, MD   50 mg at 06/05/24 1446   umeclidinium-vilanterol (ANORO ELLIPTA ) 62.5-25 MCG/ACT 1 puff  1 puff Inhalation Daily Gherghe, Costin M, MD   1 puff at 06/05/24 9170     Discharge Medications: Please see discharge summary for a list of discharge medications.  Relevant  Imaging Results:  Relevant Lab Results:   Additional Information SS#[id number] [phone]  Deserea Bordley, Nathanel, RN

## 2024-06-05 NOTE — Progress Notes (Signed)
 PROGRESS NOTE  [name] [name]  FMW:[id number] DOB: 11-17-1940 DOA: 06/04/2024 PCP: [name], DO   Brief Narrative: Patient is a 83 year old female with history of CKD stage IIIa, congenital absence of 1 kidney, hypertension, hyperlipidemia, recently diagnosed stage I lung adenocarcinoma status post SBRT, COPD on 2 L of oxygen at home intermittently who presented with complaint of nausea, vomiting, diarrhea, poor oral intake for past week, progressive weakness, difficulty with ambulation, crampy abdominal discomfort, unable to tolerate food, dysuria.  On presentation, she was hemodynamically stable.  Lab work showed creatinine of 1.7.  UA suggestive  of UTI.  Started on antibiotic, IV fluid, urine culture pending.  Assessment & Plan:  Active Problems:   AKI (acute kidney injury)  AKI on CKD stage IIIa: Baseline creatinine around 1.3.  Presented with creatinine of 1.7.  Likely from poor oral intake, also uses Lasix  at home.  Continue gentle IV fluid.  Monitor renal function.  Avoid nephrotoxins.  Kidney function back to baseline  Abdomen pain, nausea, vomiting, diarrhea: Unclear etiology.  Could be associated  with UTI.  CT abdomen/pelvis did not show any acute findings.  Continue supportive care, antiemetics, IV fluids.  Abdomen benign examination today.  Denies any abdomen pain, nausea or vomiting today.  Suspected UTI: Endorsed dysuria on admission.  UA suggestive of UTI.  Follow-up urine culture.  Continue ceftriaxone   Hypertension: Home medications on hold.  Takes Norvasc , furosemide  at home  COPD/chronic hypoxic respiratory failure: Currently not in exacerbation.  On 2 L of oxygen at home intermittently.  Continue bronchodilators as needed.  Currently requiring 2 L.  Chest x-ray did not show any acute findings.  Monitor on room air.  Stage I lung adenocarcinoma: Status post SBRT.  Recently diagnosed.  Follows with Dr. Sherrod  Pancytopenia: Recent B12 level optimal.  Monitor  CBC  History of peripheral vascular disease: Status post right external iliac/left common iliac stenting.  Follows with vascular surgery.  Continue home medications  Depression: Continue home medications.  Weakness/deconditioning/failure to thrive: Lives alone in a trailer house.  Does not have heat or  AC.  TOC consulted.  PT/OT evaluation requested.  She requests long-term placement in a nursing facility       DVT prophylaxis:enoxaparin  (LOVENOX ) injection 30 mg Start: 06/04/24 2200     Code Status: Do not attempt resuscitation (DNR) PRE-ARREST INTERVENTIONS DESIRED  Family Communication: None at bedside.  Patient says she is not in contact with many of her family members  Patient status:Obs  Patient is from :Home  Anticipated discharge to:[facility]  Estimated DC date:2-3 days   Consultants: None  Procedures:None  Antimicrobials:  Anti-infectives (From admission, onward)    Start     Dose/Rate Route Frequency Ordered Stop   06/04/24 1800  cefTRIAXone  (ROCEPHIN ) 1 g in sodium chloride  0.9 % 100 mL IVPB        1 g 200 mL/hr over 30 Minutes Intravenous Every 24 hours 06/04/24 1718 06/07/24 1759       Subjective: Patient seen and examined the bedside today.  Hemodynamically stable.  Overall comfortable.  Lying on bed.  On 2 L of oxygen per minute.  Denies shortness of breath or cough.  Denies abdomen pain, nausea or vomiting today.  She wishes to be at long-term nursing facility  Objective: Vitals:   06/04/24 1755 06/04/24 1948 06/04/24 2352 06/05/24 0559  BP: 132/62 109/66 (!) 112/49 (!) 101/56  Pulse: 66 77 74 69  Resp: 16 15 17 16   Temp: 99.2 F (  37.3 C) 98.4 F (36.9 C) 98.4 F (36.9 C) 97.8 F (36.6 C)  TempSrc: Oral Oral Oral Oral  SpO2: 96% 95% (!) 88% 94%  Weight:      Height:        Intake/Output Summary (Last 24 hours) at 06/05/2024 0756 Last data filed at 06/05/2024 0500 Gross per 24 hour  Intake --  Output 500 ml  Net -500 ml   Filed Weights    06/04/24 1156  Weight: 64 kg    Examination:  General exam: Overall comfortable, not in distress, pleasant elderly female, deconditioned HEENT: PERRL Respiratory system: Bilateral mild expiratory wheezing, rhonchi Cardiovascular system: S1 & S2 heard, RRR.  Gastrointestinal system: Abdomen is nondistended, soft and nontender. Central nervous system: Alert and oriented Extremities: No edema, no clubbing ,no cyanosis Skin: No rashes, no ulcers,no icterus     Data Reviewed: I have personally reviewed following labs and imaging studies  CBC: Recent Labs  Lab 06/04/24 1346  WBC 3.0*  NEUTROABS 1.7  HGB 11.9*  HCT 35.4*  MCV 107.9*  PLT 128*   Basic Metabolic Panel: Recent Labs  Lab 06/04/24 1346  NA 139  K 3.9  CL 103  CO2 21*  GLUCOSE 77  BUN 30*  CREATININE 1.73*  CALCIUM  9.3  MG 2.3  PHOS 2.9     No results found for this or any previous visit (from the past 240 hours).   Radiology Studies: CT ABDOMEN PELVIS WO CONTRAST Result Date: 06/04/2024 CLINICAL DATA:  Abdominal pain. EXAM: CT ABDOMEN AND PELVIS WITHOUT CONTRAST TECHNIQUE: Multidetector CT imaging of the abdomen and pelvis was performed following the standard protocol without IV contrast. RADIATION DOSE REDUCTION: This exam was performed according to the departmental dose-optimization program which includes automated exposure control, adjustment of the mA and/or kV according to patient size and/or use of iterative reconstruction technique. COMPARISON:  CT dated 03/04/2024. FINDINGS: Evaluation of this exam is limited in the absence of intravenous contrast. Lower chest: No acute abnormality. No intra-abdominal free air or free fluid Hepatobiliary: The liver is unremarkable. No biliary dilatation. The gallbladder is unremarkable. Pancreas: Unremarkable. No pancreatic ductal dilatation or surrounding inflammatory changes. Spleen: Choose a Adrenals/Urinary Tract: Adrenal glands are unremarkable. The left kidney is  atrophic. There is no hydronephrosis or excised on the right. The visualized ureters and urinary bladder unremarkable breath Stomach/Bowel: There is sigmoid diverticulosis. There is no bowel obstruction or active inflammation. The appendix is not visualized with certainty. No inflammatory changes identified in the right lower quadrant. Vascular/Lymphatic: Advanced aortoiliac atherosclerotic disease with the IVC is unremarkable no portal venous gas. There is no adenopathy. Reproductive: No suspicious adnexal masses. Other: None Musculoskeletal: Osteopenia with degenerative changes of the spine. No acute osseous pathology. IMPRESSION: 1. No acute intra-abdominal or pelvic pathology. 2. Sigmoid diverticulosis. No bowel obstruction. 3. Atrophic left kidney. 4.  Aortic Atherosclerosis (ICD10-I70.0). Electronically Signed   By: Vanetta Chou M.D.   On: 06/04/2024 18:58   DG Chest Port 1 View Result Date: 06/04/2024 CLINICAL DATA:  Shortness of breath, vomiting, diarrhea EXAM: PORTABLE CHEST 1 VIEW COMPARISON:  12/18/2023 FINDINGS: Single frontal view of the chest demonstrates an unremarkable cardiac silhouette. No acute airspace disease, effusion, or pneumothorax. No acute bony abnormalities. IMPRESSION: 1. No acute intrathoracic process. Electronically Signed   By: Ozell Daring M.D.   On: 06/04/2024 16:00    Scheduled Meds:  busPIRone   10 mg Oral TID   citalopram   20 mg Oral QHS   clopidogrel   75 mg Oral Daily   divalproex   250 mg Oral BID   enoxaparin  (LOVENOX ) injection  30 mg Subcutaneous Q24H   gabapentin   100 mg Oral TID   pantoprazole   40 mg Oral BID   umeclidinium-vilanterol  1 puff Inhalation Daily   Continuous Infusions:  sodium chloride  100 mL/hr at 06/04/24 2035   cefTRIAXone  (ROCEPHIN )  IV 1 g (06/04/24 1858)     LOS: 0 days   Ivonne Mustache, MD [facility] P11/27/2025, 7:56 AM

## 2024-06-06 DIAGNOSIS — R627 Adult failure to thrive: Secondary | ICD-10-CM | POA: Diagnosis not present

## 2024-06-06 LAB — BASIC METABOLIC PANEL WITH GFR
Anion gap: 8 (ref 5–15)
BUN: 18 mg/dL (ref 8–23)
CO2: 21 mmol/L — ABNORMAL LOW (ref 22–32)
Calcium: 8.3 mg/dL — ABNORMAL LOW (ref 8.9–10.3)
Chloride: 111 mmol/L (ref 98–111)
Creatinine, Ser: 1.29 mg/dL — ABNORMAL HIGH (ref 0.44–1.00)
GFR, Estimated: 41 mL/min — ABNORMAL LOW (ref >60)
Glucose, Bld: 91 mg/dL (ref 70–99)
Potassium: 4.6 mmol/L (ref 3.5–5.1)
Sodium: 140 mmol/L (ref 135–145)

## 2024-06-06 MED ORDER — MORPHINE SULFATE (PF) 2 MG/ML IV SOLN
2.0000 mg | INTRAVENOUS | Status: DC | PRN
  Administered 2024-06-06 – 2024-06-07 (×2): 2 mg via INTRAVENOUS
  Filled 2024-06-06 (×2): qty 1

## 2024-06-06 MED ORDER — IPRATROPIUM-ALBUTEROL 0.5-2.5 (3) MG/3ML IN SOLN
3.0000 mL | Freq: Two times a day (BID) | RESPIRATORY_TRACT | Status: DC
  Administered 2024-06-06: 3 mL via RESPIRATORY_TRACT
  Filled 2024-06-06 (×3): qty 3

## 2024-06-06 MED ORDER — OXYCODONE HCL 5 MG PO TABS
5.0000 mg | ORAL_TABLET | Freq: Four times a day (QID) | ORAL | Status: DC | PRN
  Administered 2024-06-06 – 2024-06-12 (×12): 5 mg via ORAL
  Filled 2024-06-06 (×13): qty 1

## 2024-06-06 MED ORDER — IPRATROPIUM-ALBUTEROL 0.5-2.5 (3) MG/3ML IN SOLN: 3.0000 mL | Freq: Two times a day (BID) | RESPIRATORY_TRACT | Status: DC

## 2024-06-06 MED ORDER — PREDNISONE 20 MG PO TABS
20.0000 mg | ORAL_TABLET | Freq: Every day | ORAL | Status: DC
  Administered 2024-06-06 – 2024-06-07 (×2): 20 mg via ORAL
  Filled 2024-06-06 (×2): qty 1

## 2024-06-06 NOTE — Plan of Care (Signed)
  Problem: Education: Goal: Knowledge of General Education information will improve Description: Including pain rating scale, medication(s)/side effects and non-pharmacologic comfort measures Outcome: Progressing   Problem: Health Behavior/Discharge Planning: Goal: Ability to manage health-related needs will improve Outcome: Progressing   Problem: Nutrition: Goal: Adequate nutrition will be maintained Outcome: Progressing   Problem: Elimination: Goal: Will not experience complications related to urinary retention Outcome: Progressing   Problem: Pain Managment: Goal: General experience of comfort will improve and/or be controlled Outcome: Progressing   Problem: Safety: Goal: Ability to remain free from injury will improve Outcome: Progressing

## 2024-06-06 NOTE — Plan of Care (Signed)

## 2024-06-06 NOTE — Evaluation (Signed)
 Occupational Therapy Evaluation Patient Details Name: [name] [name] MRN: [id number] DOB: Jun 28, 1941 Today's Date: 06/06/2024   History of Present Illness   Patient is an 83 yr old female admitted 06/04/24 due to nausea, vomiting, diarrhea and poor p.o intake.  Found to have AKI on CKD III and possible UTI.  PMH positive for CKD III, solitary kidney (congenital), depression, HTN, GERD, PVD, sleep apnea, glaucoma, PNA, stage 1 lung cancer, RA, polymyalgia rheumatica      Clinical Impressions The pt reported difficulty with performing ADLs and household chores at her baseline. She has been using wheelchair for the past few months and typically takes sponge baths as she does not feel safe transferring into and out of her tub. She is currently limited by the below listed deficits (see OT problem list). As such, her occupational performance is compromised and she requires assist for self-care management. During the session today, she required increased time and effort and mod assist to roll in bed and max assist for simulated lower body dressing. She was also noted to be with a painful R hip with associated grimacing and guarding (pt reported a prior fall), soreness with decreased L shoulder AROM , tremulous/shaky BUE presentation, impaired vision (chronic impairment), general deconditioning, generalized weakness, and compromised activity tolerance. She will benefit from further OT services to maximize her ADL performance and to decrease the risk for further weakness and deconditioning. Patient will benefit from continued inpatient follow up therapy, <3 hours/day.      If plan is discharge home, recommend the following:   A lot of help with walking and/or transfers;A lot of help with bathing/dressing/bathroom;Help with stairs or ramp for entrance;Assistance with cooking/housework     Functional Status Assessment   Patient has had a recent decline in their functional status and demonstrates the  ability to make significant improvements in function in a reasonable and predictable amount of time.     Equipment Recommendations   Wheelchair (measurements OT);Wheelchair cushion (measurements OT) (patient reports her wheelchair is old and borrowed)     Recommendations for Other Services         Precautions/Restrictions   Restrictions [employer] Bearing Restrictions Per Provider Order: No Other Position/Activity Restrictions: chronic R hip pain     Mobility Bed Mobility Overal bed mobility: Needs Assistance Bed Mobility: Rolling Rolling: Mod assist (rolling to the left)         General bed mobility comments: Required increased time and effort, due to R hip pain           ADL either performed or assessed with clinical judgement   ADL Overall ADL's : Needs assistance/impaired Eating/Feeding: Minimal assistance;Bed level Eating/Feeding Details (indicate cue type and reason): Pt reported baseline issues with maintaining grasp of food items and utensils, due to BUE tremors and suspected motor control impairment. She also has difficulty opening packets, removing lids and cutting foods. She stated she often drops items or uncontrollably throws them. Grooming: Minimal assistance;Bed level Grooming Details (indicate cue type and reason): Limited by tremulous presentation and suspected motor control impairment. Upper Body Bathing: Minimal assistance;Bed level   Lower Body Bathing: Maximal assistance;Bed level   Upper Body Dressing : Moderate assistance;Bed level   Lower Body Dressing: Maximal assistance;Bed level       Toileting- Clothing Manipulation and Hygiene: Maximal assistance;Bed level               Vision Baseline Vision/History: 1 Wears glasses;3 Glaucoma Additional Comments: She was unable to read the  time depicted on the wall clock. Per her medical chart, she has a history of glaucoma. She stated she would need to wear her glasses in order to see the  clock, which is typical of her baseline.            Pertinent Vitals/Pain Pain Assessment Pain Assessment: 0-10 Pain Score: 8  Pain Location: R hip with mobility Pain Descriptors / Indicators: Aching, Grimacing, Guarding Pain Intervention(s): Limited activity within patient's tolerance, Monitored during session, Repositioned     Extremity/Trunk Assessment Upper Extremity Assessment Upper Extremity Assessment: Right hand dominant;Generalized weakness;LUE deficits/detail;RUE deficits/detail RUE Deficits / Details: AROM WFL. Grip strength 4-/5 LUE Deficits / Details: L shoulder painful with elevation. Grip strength 4-/5   Lower Extremity Assessment Lower Extremity Assessment: Generalized weakness;LLE deficits/detail RLE Deficits / Details:  (Pt with decreased tolerance for formal ROM testing of proximal RLE, due to hip pain & associated guarding. Pt reported a prior fall with subsequent soreness; she stated x-ray was negative for fracture.) LLE Deficits / Details: AROM to [facility]       Communication Communication Communication: No apparent difficulties   Cognition Arousal: Alert Behavior During Therapy: Anxious, Lability               OT - Cognition Comments: Oriented x4          Following commands: Impaired, Intact Following commands impaired: Follows one step commands with increased time     Cueing  General Comments   Cueing Techniques: Verbal cues              Home Living Family/patient expects to be discharged to:: Private residence Living Arrangements: Alone Available Help at Discharge: Neighbor Type of Home: Mobile home       Home Layout: One level     Bathroom Shower/Tub: Tub/shower unit         Home Equipment: [employer] (2 wheels);BSC/3in1;Shower seat;Rollator (4 wheels);Wheelchair - manual (She reported needing a new wheelchair, as hers is old and borrowed.)   Additional Comments:  (She stated she has no heat and air at her home,  and that mice have gotten into her home.  OT informed case manager of pt's reports.)      Prior Functioning/Environment Prior Level of Function : Needs assist;Independent/Modified Independent             Mobility Comments:  (She stated she has not ambulated in a couple months, rather has been using a wheelchair.) ADLs Comments:  (She reported having increased difficulty performing bathing, dressing, and toileting tasks, as well as household cleaning. She receives meals on wheels. She has been taking spongebaths, as she does not feel safe getting in & out of her tub.)    OT Problem List: Decreased strength;Decreased range of motion;Decreased activity tolerance;Impaired balance (sitting and/or standing);Impaired vision/perception;Decreased coordination;Decreased knowledge of use of DME or AE;Impaired UE functional use;Pain   OT Treatment/Interventions: Self-care/ADL training;Therapeutic exercise;Energy conservation;Neuromuscular education;Therapeutic activities;DME and/or AE instruction;Balance training;Patient/family education      OT Goals(Current goals can be found in the care plan section)   Acute Rehab OT Goals Patient Stated Goal: to get better OT Goal Formulation: With patient Time For Goal Achievement: 06/20/24 Potential to Achieve Goals: Good ADL Goals Pt Will Perform Eating: with set-up;with supervision;with adaptive utensils;sitting Pt Will Perform Grooming: with set-up;with supervision;sitting;with adaptive equipment Pt Will Perform Upper Body Dressing: with set-up;with supervision;sitting Pt Will Transfer to Toilet: with supervision;stand pivot transfer;bedside commode;squat pivot transfer Additional ADL Goal #1: The patient will  perform bed mobility with supervision, in prep for progressive ADL participation.   OT Frequency:  Min 2X/week       AM-PAC OT 6 Clicks Daily Activity     Outcome Measure Help from another person eating meals?: A Little Help from  another person taking care of personal grooming?: A Little Help from another person toileting, which includes using toliet, bedpan, or urinal?: A Lot Help from another person bathing (including washing, rinsing, drying)?: A Lot Help from another person to put on and taking off regular upper body clothing?: A Lot Help from another person to put on and taking off regular lower body clothing?: A Lot 6 Click Score: 14   End of Session Equipment Utilized During Treatment: Other (comment) (N/A) Nurse Communication: Mobility status  Activity Tolerance: Patient limited by pain Patient left: in bed;with call bell/phone within reach;with bed alarm set;with family/visitor present  OT Visit Diagnosis: Unsteadiness on feet (R26.81);Other abnormalities of gait and mobility (R26.89);Muscle weakness (generalized) (M62.81);History of falling (Z91.81);Low vision, both eyes (H54.2);Feeding difficulties (R63.3);Pain Pain - Right/Left:  (R hip and ankle)                Time: 8794-8759 OT Time Calculation (min): 35 min Charges:  OT General Charges $OT Visit: 1 Visit OT Evaluation $OT Eval Moderate Complexity: 1 Mod    Aristotle Lieb J Harris, OTR/L 06/06/2024, 3:16 PM

## 2024-06-06 NOTE — Progress Notes (Addendum)
 PROGRESS NOTE  [name] [name]  FMW:[id number] DOB: Nov 22, 1940 DOA: 06/04/2024 PCP: [name], DO   Brief Narrative: Patient is a 83 year old female with history of CKD stage IIIa, congenital absence of 1 kidney, hypertension, hyperlipidemia, recently diagnosed stage I lung adenocarcinoma status post SBRT, COPD on 2 L of oxygen at home intermittently who presented with complaint of nausea, vomiting, diarrhea, poor oral intake for past week, progressive weakness, difficulty with ambulation, crampy abdominal discomfort, unable to tolerate food, dysuria.  On presentation, she was hemodynamically stable.  Lab work showed creatinine of 1.7.  UA suggestive  of UTI.  Started on antibiotic, IV fluid, urine culture pending.  PT recommending SNF on discharge,TOC following  Assessment & Plan:  Active Problems:   AKI (acute kidney injury)  AKI on CKD stage IIIa: Baseline creatinine around 1.3.  Presented with creatinine of 1.7.  Likely from poor oral intake, also uses Lasix  at home.    Monitor renal function.  Avoid nephrotoxins.  Kidney function back to baseline.  IV fluid stopped  Abdomen pain, nausea, vomiting, diarrhea: Unclear etiology.  Could be associated  with UTI.  CT abdomen/pelvis did not show any acute findings.  Continue supportive care, antiemetics, IV fluids.  Abdomen benign examination today.  Denies any abdomen pain, nausea or vomiting today.  Suspected UTI: Endorsed dysuria on admission.  UA suggestive of UTI.  Follow-up urine culture.  Continue ceftriaxone   Hypertension: Home medications on hold.  Takes Norvasc , furosemide  at home  COPD/chronic hypoxic respiratory failure: Currently not in exacerbation.  On 2 L of oxygen at home intermittently.  Continue bronchodilators as needed.  Currently requiring 2 L.  Chest x-ray did not show any acute findings.  Will attempt to wean the oxygen.  Has mild expiratory wheezing bilaterally today.  Started on low-dose prednisone .  Stage I lung  adenocarcinoma: Status post SBRT.  Recently diagnosed.  Follows with Dr. Sherrod  Pancytopenia: Recent B12 level optimal.  Monitor CBC  History of peripheral vascular disease: Status post right external iliac/left common iliac stenting.  Follows with vascular surgery.  Continue home medications  Depression: Continue home medications.  Weakness/deconditioning/failure to thrive: Lives alone in a trailer house.  Does not have heat or  AC.  TOC consulted.  PT/OT evaluation requested.  She requests long-term placement in a nursing facility       DVT prophylaxis:enoxaparin  (LOVENOX ) injection 30 mg Start: 06/04/24 2200     Code Status: Do not attempt resuscitation (DNR) PRE-ARREST INTERVENTIONS DESIRED  Family Communication: None at bedside.  Patient says she is not in contact with many of her family members  Patient status:Obs  Patient is from :Home  Anticipated discharge to:SnF  Estimated DC date: Whenever possible   Consultants: None  Procedures:None  Antimicrobials:  Anti-infectives (From admission, onward)    Start     Dose/Rate Route Frequency Ordered Stop   06/04/24 1800  cefTRIAXone  (ROCEPHIN ) 1 g in sodium chloride  0.9 % 100 mL IVPB        1 g 200 mL/hr over 30 Minutes Intravenous Every 24 hours 06/04/24 1718 06/07/24 1759       Subjective: Patient seen and examined at bedside today.  Overall comfortable.  Lying in bed.  Has some cough.  On 2 L of oxygen per minute.  Denies any shortness of breath or cough.  No abdominal, nausea or vomiting.  Has tremor on the right hand  Objective: Vitals:   06/05/24 1452 06/05/24 2136 06/06/24 0712 06/06/24 0856  BP: ROLLEN)  98/55 (!) 103/47 (!) 119/59   Pulse: 75 67 70   Resp: 20 18 18    Temp: 98.2 F (36.8 C) 98.7 F (37.1 C) 98.2 F (36.8 C)   TempSrc: Oral Oral    SpO2: 97% 96% 96% 96%  Weight:      Height:        Intake/Output Summary (Last 24 hours) at 06/06/2024 1052 Last data filed at 06/06/2024 9041 Gross per  24 hour  Intake 3541.64 ml  Output 300 ml  Net 3241.64 ml   Filed Weights   06/04/24 1156  Weight: 64 kg    Examination:   General exam: Overall comfortable, not in distress, pleasant elderly female HEENT: PERRL Respiratory system: Mild diminished sounds bilaterally, mild expiratory wheezing bilaterally Cardiovascular system: S1 & S2 heard, RRR.  Gastrointestinal system: Abdomen is nondistended, soft and nontender. Central nervous system: Alert and oriented Extremities: No edema, no clubbing ,no cyanosis Skin: No rashes, no ulcers,no icterus     Data Reviewed: I have personally reviewed following labs and imaging studies  CBC: Recent Labs  Lab 06/04/24 1346 06/05/24 0806  WBC 3.0* 3.2*  NEUTROABS 1.7  --   HGB 11.9* 10.9*  HCT 35.4* 33.1*  MCV 107.9* 111.8*  PLT 128* 117*   Basic Metabolic Panel: Recent Labs  Lab 06/04/24 1346 06/05/24 0806 06/06/24 0512  NA 139 142 140  K 3.9 4.0 4.6  CL 103 111 111  CO2 21* 21* 21*  GLUCOSE 77 71 91  BUN 30* 23 18  CREATININE 1.73* 1.39* 1.29*  CALCIUM  9.3 8.4* 8.3*  MG 2.3  --   --   PHOS 2.9  --   --      Recent Results (from the past 240 hours)  Urine Culture (for pregnant, neutropenic or urologic patients or patients with an indwelling urinary catheter)     Status: None (Preliminary result)   Collection Time: 06/04/24 12:36 AM   Specimen: Urine, Clean Catch  Result Value Ref Range Status   Specimen Description   Final    URINE, CLEAN CATCH Performed at [facility], [address], [place], [place] [address]    Special Requests   Final    NONE Performed at [facility], [address], [place], [place] [address]    Culture   Final    CULTURE REINCUBATED FOR BETTER GROWTH Performed at [facility], [address] [place], [place] [address]    Report Status PENDING  Incomplete     Radiology Studies: CT ABDOMEN PELVIS WO CONTRAST Result Date:  06/04/2024 CLINICAL DATA:  Abdominal pain. EXAM: CT ABDOMEN AND PELVIS WITHOUT CONTRAST TECHNIQUE: Multidetector CT imaging of the abdomen and pelvis was performed following the standard protocol without IV contrast. RADIATION DOSE REDUCTION: This exam was performed according to the departmental dose-optimization program which includes automated exposure control, adjustment of the mA and/or kV according to patient size and/or use of iterative reconstruction technique. COMPARISON:  CT dated 03/04/2024. FINDINGS: Evaluation of this exam is limited in the absence of intravenous contrast. Lower chest: No acute abnormality. No intra-abdominal free air or free fluid Hepatobiliary: The liver is unremarkable. No biliary dilatation. The gallbladder is unremarkable. Pancreas: Unremarkable. No pancreatic ductal dilatation or surrounding inflammatory changes. Spleen: Choose a Adrenals/Urinary Tract: Adrenal glands are unremarkable. The left kidney is atrophic. There is no hydronephrosis or excised on the right. The visualized ureters and urinary bladder unremarkable breath Stomach/Bowel: There is sigmoid diverticulosis. There is  no bowel obstruction or active inflammation. The appendix is not visualized with certainty. No inflammatory changes identified in the right lower quadrant. Vascular/Lymphatic: Advanced aortoiliac atherosclerotic disease with the IVC is unremarkable no portal venous gas. There is no adenopathy. Reproductive: No suspicious adnexal masses. Other: None Musculoskeletal: Osteopenia with degenerative changes of the spine. No acute osseous pathology. IMPRESSION: 1. No acute intra-abdominal or pelvic pathology. 2. Sigmoid diverticulosis. No bowel obstruction. 3. Atrophic left kidney. 4.  Aortic Atherosclerosis (ICD10-I70.0). Electronically Signed   By: Vanetta Chou M.D.   On: 06/04/2024 18:58   DG Chest Port 1 View Result Date: 06/04/2024 CLINICAL DATA:  Shortness of breath, vomiting, diarrhea EXAM:  PORTABLE CHEST 1 VIEW COMPARISON:  12/18/2023 FINDINGS: Single frontal view of the chest demonstrates an unremarkable cardiac silhouette. No acute airspace disease, effusion, or pneumothorax. No acute bony abnormalities. IMPRESSION: 1. No acute intrathoracic process. Electronically Signed   By: Ozell Daring M.D.   On: 06/04/2024 16:00    Scheduled Meds:  busPIRone   10 mg Oral TID   citalopram   20 mg Oral QHS   clopidogrel   75 mg Oral Daily   divalproex   250 mg Oral BID   enoxaparin  (LOVENOX ) injection  30 mg Subcutaneous Q24H   gabapentin   100 mg Oral TID   ipratropium-albuterol   3 mL Nebulization BID   pantoprazole   40 mg Oral BID   predniSONE   20 mg Oral Q breakfast   umeclidinium-vilanterol  1 puff Inhalation Daily   Continuous Infusions:  cefTRIAXone  (ROCEPHIN )  IV Stopped (06/05/24 1806)     LOS: 0 days   Ivonne Mustache, MD [facility] P11/28/2025, 10:52 AM

## 2024-06-06 NOTE — TOC Progression Note (Addendum)
 Transition of Care [facility]) - Progression Note    Patient Details  Name: [name] [name] MRN: [id number] Date of Birth: 16-Apr-1941  Transition of Care [facility]) CM/SW Contact  [name], RN Phone Number:[phone]  06/06/2024, 2:41 PM  Clinical Narrative:    [name] remains pending . Requested information has been uploaded in [place] system.   1458 Bed request were pending. CM has submitted for bed offers.    Expected Discharge Plan: Skilled Nursing Facility Barriers to Discharge: Continued Medical Work up               Expected Discharge Plan and Services   Discharge Planning Services: CM Consult Post Acute Care Choice: Skilled Nursing Facility Living arrangements for the past 2 months: Single Family Home                                       Social Drivers of Health (SDOH) Interventions SDOH Screenings   Food Insecurity: Food Insecurity Present (06/04/2024)  Housing: High Risk (06/04/2024)  Transportation Needs: No Transportation Needs (06/04/2024)  Utilities: Not At Risk (06/04/2024)  Alcohol  Screen: Low Risk  (10/13/2022)  Depression (PHQ2-9): Medium Risk (05/20/2024)  Financial Resource Strain: Low Risk  (10/13/2022)  Physical Activity: Insufficiently Active (10/13/2022)  Social Connections: Moderately Integrated (06/04/2024)  Stress: Stress Concern Present (10/13/2022)  Tobacco Use: Medium Risk (06/04/2024)    Readmission Risk Interventions    06/08/2023    1:37 PM 07/26/2022   12:20 PM 05/09/2022    3:18 PM  Readmission Risk Prevention Plan  Post Dischage Appt  Complete Complete  Medication Screening  Complete Complete  Transportation Screening Complete Complete   PCP or Specialist Appt within 5-7 Days Complete    Home Care Screening Complete    Medication Review (RN CM) Complete

## 2024-06-07 ENCOUNTER — Observation Stay (HOSPITAL_COMMUNITY)

## 2024-06-07 DIAGNOSIS — R627 Adult failure to thrive: Secondary | ICD-10-CM | POA: Diagnosis not present

## 2024-06-07 MED ORDER — GUAIFENESIN-DM 100-10 MG/5ML PO SYRP
10.0000 mL | ORAL_SOLUTION | Freq: Four times a day (QID) | ORAL | Status: DC | PRN
  Administered 2024-06-07: 10 mL via ORAL
  Filled 2024-06-07: qty 10

## 2024-06-07 MED ORDER — IPRATROPIUM-ALBUTEROL 0.5-2.5 (3) MG/3ML IN SOLN
3.0000 mL | Freq: Four times a day (QID) | RESPIRATORY_TRACT | Status: DC
  Administered 2024-06-07 (×3): 3 mL via RESPIRATORY_TRACT
  Filled 2024-06-07 (×2): qty 3

## 2024-06-07 MED ORDER — PREDNISONE 20 MG PO TABS
40.0000 mg | ORAL_TABLET | Freq: Every day | ORAL | Status: AC
  Administered 2024-06-08 – 2024-06-10 (×3): 40 mg via ORAL
  Filled 2024-06-07 (×3): qty 2

## 2024-06-07 MED ORDER — IPRATROPIUM-ALBUTEROL 0.5-2.5 (3) MG/3ML IN SOLN
3.0000 mL | Freq: Four times a day (QID) | RESPIRATORY_TRACT | Status: DC
  Administered 2024-06-08 – 2024-06-09 (×7): 3 mL via RESPIRATORY_TRACT
  Filled 2024-06-07 (×7): qty 3

## 2024-06-07 MED ORDER — IPRATROPIUM-ALBUTEROL 0.5-2.5 (3) MG/3ML IN SOLN
3.0000 mL | Freq: Three times a day (TID) | RESPIRATORY_TRACT | Status: DC
Start: 2024-06-07 — End: 2024-06-07

## 2024-06-07 NOTE — Progress Notes (Addendum)
 PROGRESS NOTE  [name] [name]  FMW:[id number] DOB: September 11, 1940 DOA: 06/04/2024 PCP: [name], DO   Brief Narrative: Patient is a 83 year old female with history of CKD stage IIIa, congenital absence of 1 kidney, hypertension, hyperlipidemia, recently diagnosed stage I lung adenocarcinoma status post SBRT, COPD on 2 L of oxygen at home intermittently who presented with complaint of nausea, vomiting, diarrhea, poor oral intake for past week, progressive weakness, difficulty with ambulation, crampy abdominal discomfort, unable to tolerate food, dysuria.  On presentation, she was hemodynamically stable.  Lab work showed creatinine of 1.7.  UA suggestive  of UTI.  Started on antibiotic, IV fluid, urine culture pending.  PT recommending SNF on discharge,TOC following.  Medically stable for discharge.  Assessment & Plan:  Active Problems:   AKI (acute kidney injury)  AKI on CKD stage IIIa: Baseline creatinine around 1.3.  Presented with creatinine of 1.7.  Likely from poor oral intake, also uses Lasix  at home.    Monitor renal function.  Avoid nephrotoxins.  Kidney function back to baseline.  IV fluid stopped  Abdomen pain, nausea, vomiting, diarrhea: Unclear etiology.  Could be associated  with UTI.  CT abdomen/pelvis did not show any acute findings.  Given supportive care, antiemetics, IV fluids.  Abdomen benign examination today.  Denies any abdomen pain, nausea or vomiting today.  Suspected UTI: Endorsed dysuria on admission.  Urine culture showed Proteus, pansensitive.  Antibiotic changed to oral.  Plan for total 5 days course including IV antibiotics  Hypertension: Home medications on hold.  Takes Norvasc , furosemide  at home  COPD/chronic hypoxic respiratory failure: Currently not in exacerbation.  On 2 L of oxygen at home intermittently.  Continue bronchodilators as needed.  Currently requiring 2 L.  Chest x-ray did not show any acute findings.  Will attempt to wean the oxygen.  Had mild  expiratory wheezing bilaterally today.  Continue  prednisone .  Stage I lung adenocarcinoma: Status post SBRT.  Recently diagnosed.  Follows with Dr. Sherrod  Pancytopenia: Recent B12 level optimal.  Monitor CBC  History of peripheral vascular disease: Status post right external iliac/left common iliac stenting.  Follows with vascular surgery.  Continue home medications  Depression: Continue home medications.  Weakness/deconditioning/failure to thrive/fall: Lives alone in a trailer house.  Does not have heat or  AC.  TOC consulted.  PT/OT evaluation requested.  She requests long-term placement in a nursing facility. Reported falling about 2 months ago.  Complains of right hip and right ankle pain. No acute findings as per  x-rays of the right hip and right ankle       DVT prophylaxis:enoxaparin  (LOVENOX ) injection 30 mg Start: 06/04/24 2200     Code Status: Do not attempt resuscitation (DNR) PRE-ARREST INTERVENTIONS DESIRED  Family Communication: None at bedside.  Patient says she is not in contact with many of her family members  Patient status:Obs  Patient is from :Home  Anticipated discharge to:SnF  Estimated DC date: Whenever possible   Consultants: None  Procedures:None  Antimicrobials:  Anti-infectives (From admission, onward)    Start     Dose/Rate Route Frequency Ordered Stop   06/04/24 1800  cefTRIAXone  (ROCEPHIN ) 1 g in sodium chloride  0.9 % 100 mL IVPB        1 g 200 mL/hr over 30 Minutes Intravenous Every 24 hours 06/04/24 1718 06/06/24 1818       Subjective: Patient seen and examined at bedside today.    Lying on bed.  Appears weak and deconditioned but comfortable.  On 2 L of oxygen per minute.  Denies any worsening shortness of breath or cough.  Still complaining of right hip pain and right ankle pain.  We discussed the finding of x-rays. Had a bowel movement  this morning  Objective: Vitals:   06/06/24 2010 06/06/24 2030 06/07/24 0548 06/07/24 1033   BP: (!) 123/59  114/83   Pulse: 69  66   Resp: 16  16   Temp: 97.9 F (36.6 C)  (!) 97.5 F (36.4 C)   TempSrc: Oral  Oral   SpO2: 90% 93% 98% 97%  Weight:      Height:        Intake/Output Summary (Last 24 hours) at 06/07/2024 1042 Last data filed at 06/07/2024 9066 Gross per 24 hour  Intake 480 ml  Output 900 ml  Net -420 ml   Filed Weights   06/04/24 1156  Weight: 64 kg    Examination:  General exam: Overall comfortable, not in distress, pleasant elderly female HEENT: PERRL Respiratory system:  no wheezes or crackles, mildly diminished sounds bilaterally Cardiovascular system: S1 & S2 heard, RRR.  Gastrointestinal system: Abdomen is nondistended, soft and nontender. Central nervous system: Alert and oriented Extremities: No edema, no clubbing ,no cyanosis Skin: No rashes, no ulcers,no icterus     Data Reviewed: I have personally reviewed following labs and imaging studies  CBC: Recent Labs  Lab 06/04/24 1346 06/05/24 0806  WBC 3.0* 3.2*  NEUTROABS 1.7  --   HGB 11.9* 10.9*  HCT 35.4* 33.1*  MCV 107.9* 111.8*  PLT 128* 117*   Basic Metabolic Panel: Recent Labs  Lab 06/04/24 1346 06/05/24 0806 06/06/24 0512  NA 139 142 140  K 3.9 4.0 4.6  CL 103 111 111  CO2 21* 21* 21*  GLUCOSE 77 71 91  BUN 30* 23 18  CREATININE 1.73* 1.39* 1.29*  CALCIUM  9.3 8.4* 8.3*  MG 2.3  --   --   PHOS 2.9  --   --      Recent Results (from the past 240 hours)  Urine Culture (for pregnant, neutropenic or urologic patients or patients with an indwelling urinary catheter)     Status: Abnormal (Preliminary result)   Collection Time: 06/04/24 12:36 AM   Specimen: Urine, Clean Catch  Result Value Ref Range Status   Specimen Description   Final    URINE, CLEAN CATCH Performed at [facility], [address], [place], [place] [address]    Special Requests   Final    NONE Performed at [facility], [address],  [place], [place] [address]    Culture (A)  Final    >=100,000 COLONIES/mL PROTEUS MIRABILIS SUSCEPTIBILITIES TO FOLLOW Performed at [facility], [address] [place], [place] [address]    Report Status PENDING  Incomplete     Radiology Studies: No results found.   Scheduled Meds:  busPIRone   10 mg Oral TID   citalopram   20 mg Oral QHS   clopidogrel   75 mg Oral Daily   divalproex   250 mg Oral BID   enoxaparin  (LOVENOX ) injection  30 mg Subcutaneous Q24H   gabapentin   100 mg Oral TID   ipratropium-albuterol   3 mL Nebulization Q6H   pantoprazole   40 mg Oral BID   [START ON 06/08/2024] predniSONE   40 mg Oral Q breakfast   umeclidinium-vilanterol  1 puff Inhalation Daily   Continuous Infusions:     LOS: 0 days   Ginamarie Banfield,  MD [facility] P11/29/2025, 10:42 AM

## 2024-06-07 NOTE — Plan of Care (Signed)
  Problem: Pain Managment: Goal: General experience of comfort will improve and/or be controlled Outcome: Progressing   Problem: Safety: Goal: Ability to remain free from injury will improve Outcome: Progressing   Problem: Skin Integrity: Goal: Risk for impaired skin integrity will decrease Outcome: Progressing

## 2024-06-07 NOTE — Progress Notes (Signed)
 X-ray tech attempted xray on pt. Unsuccessful. Tech called for nurse d/t pt having a slight fainting episode during scan. This nurse immediately went into the room and reassessed pt. Pt stated her head feels like it was turning. Morphine  was given prior to scan at 933 for 10/10 hip pain. Pt a&ox4 and VSS BP 150/63 (87) O2 100% 2L [place] HR 81 RR 16. Afebrile. Provider aware and no additional orders at this time. Care continues.

## 2024-06-08 DIAGNOSIS — N179 Acute kidney failure, unspecified: Secondary | ICD-10-CM | POA: Diagnosis not present

## 2024-06-08 LAB — CBC
HCT: 35 % — ABNORMAL LOW (ref 36.0–46.0)
Hemoglobin: 11.2 g/dL — ABNORMAL LOW (ref 12.0–15.0)
MCH: 36.8 pg — ABNORMAL HIGH (ref 26.0–34.0)
MCHC: 32 g/dL (ref 30.0–36.0)
MCV: 115.1 fL — ABNORMAL HIGH (ref 80.0–100.0)
Platelets: 113 K/uL — ABNORMAL LOW (ref 150–400)
RBC: 3.04 MIL/uL — ABNORMAL LOW (ref 3.87–5.11)
RDW: 15 % (ref 11.5–15.5)
WBC: 4.1 K/uL (ref 4.0–10.5)
nRBC: 0 % (ref 0.0–0.2)

## 2024-06-08 LAB — URINE CULTURE

## 2024-06-08 LAB — BASIC METABOLIC PANEL WITH GFR
Anion gap: 9 (ref 5–15)
BUN: 16 mg/dL (ref 8–23)
CO2: 24 mmol/L (ref 22–32)
Calcium: 9.5 mg/dL (ref 8.9–10.3)
Chloride: 107 mmol/L (ref 98–111)
Creatinine, Ser: 0.81 mg/dL (ref 0.44–1.00)
GFR, Estimated: >60 mL/min (ref >60)
Glucose, Bld: 112 mg/dL — ABNORMAL HIGH (ref 70–99)
Potassium: 5.1 mmol/L (ref 3.5–5.1)
Sodium: 139 mmol/L (ref 135–145)

## 2024-06-08 MED ORDER — CEPHALEXIN 500 MG PO CAPS
500.0000 mg | ORAL_CAPSULE | Freq: Three times a day (TID) | ORAL | Status: AC
  Administered 2024-06-08 – 2024-06-10 (×6): 500 mg via ORAL
  Filled 2024-06-08 (×6): qty 1

## 2024-06-08 MED ORDER — ENOXAPARIN SODIUM 40 MG/0.4ML IJ SOSY
40.0000 mg | PREFILLED_SYRINGE | INTRAMUSCULAR | Status: DC
  Administered 2024-06-08 – 2024-06-11 (×4): 40 mg via SUBCUTANEOUS
  Filled 2024-06-08 (×4): qty 0.4

## 2024-06-08 NOTE — Plan of Care (Signed)

## 2024-06-08 NOTE — Plan of Care (Signed)
   Problem: Activity: Goal: Risk for activity intolerance will decrease Outcome: Progressing   Problem: Coping: Goal: Level of anxiety will decrease Outcome: Progressing   Problem: Pain Managment: Goal: General experience of comfort will improve and/or be controlled Outcome: Progressing

## 2024-06-08 NOTE — Progress Notes (Signed)
 PROGRESS NOTE  [name] [name]  FMW:[id number] DOB: [birth year] DOA: 06/04/2024 PCP: [name], DO   Brief Narrative: Patient is a 83 year old female with history of CKD stage IIIa, congenital absence of 1 kidney, hypertension, hyperlipidemia, recently diagnosed stage I lung adenocarcinoma status post SBRT, COPD on 2 L of oxygen at home intermittently who presented with complaint of nausea, vomiting, diarrhea, poor oral intake for past week, progressive weakness, difficulty with ambulation, crampy abdominal discomfort, unable to tolerate food, dysuria.  On presentation, she was hemodynamically stable.  Lab work showed creatinine of 1.7.  UA suggestive  of UTI.  Started on antibiotic, IV fluid, urine culture pending.  PT recommending SNF on discharge,TOC following.  Medically stable for discharge.  Assessment & Plan:  Active Problems:   AKI (acute kidney injury)  AKI on CKD stage IIIa: Baseline creatinine around 1.3.  Presented with creatinine of 1.7.  Likely from poor oral intake, also uses Lasix  at home.    Monitor renal function.  Avoid nephrotoxins.  Kidney function back to baseline.  IV fluid stopped  Abdomen pain, nausea, vomiting, diarrhea: Unclear etiology.  Could be associated  with UTI.  CT abdomen/pelvis did not show any acute findings.  Given supportive care, antiemetics, IV fluids.  Abdomen benign examination today.  Denies any abdomen pain, nausea or vomiting today.  Suspected UTI: Endorsed dysuria on admission.  Urine culture showed Proteus, pansensitive.  Antibiotic changed to oral.  Plan for total 5 days course including IV antibiotics  Hypertension: Home medications on hold.  Takes Norvasc , furosemide  at home  COPD/chronic hypoxic respiratory failure: Currently not in exacerbation.  On 2 L of oxygen at home intermittently.  Continue bronchodilators as needed.  Currently requiring 2 L.  Chest x-ray did not show any acute findings.  Will attempt to wean the oxygen.  Had mild  expiratory wheezing bilaterally today.  Continue  prednisone .  Stage I lung adenocarcinoma: Status post SBRT.  Recently diagnosed.  Follows with Dr. Sherrod  Pancytopenia: Recent B12 level optimal.  Monitor CBC  History of peripheral vascular disease: Status post right external iliac/left common iliac stenting.  Follows with vascular surgery.  Continue home medications  Depression: Continue home medications.  Weakness/deconditioning/failure to thrive/fall: Lives alone in a trailer house.  Does not have heat or  AC.  TOC consulted.  PT/OT evaluation requested.  She requests long-term placement in a nursing facility. Reported falling about 2 months ago.  Complains of right hip and right ankle pain. No acute findings as per  x-rays of the right hip and right ankle       DVT prophylaxis:enoxaparin  (LOVENOX ) injection 30 mg Start: 06/04/24 2200     Code Status: Do not attempt resuscitation (DNR) PRE-ARREST INTERVENTIONS DESIRED  Family Communication: None at bedside.  Patient says she is not in contact with many of her family members  Patient status:Obs  Patient is from :Home  Anticipated discharge to:SnF  Estimated DC date: Whenever possible   Consultants: None  Procedures:None  Antimicrobials:  Anti-infectives (From admission, onward)    Start     Dose/Rate Route Frequency Ordered Stop   06/08/24 1100  cephALEXin  (KEFLEX ) capsule 500 mg        500 mg Oral Every 8 hours 06/08/24 1010 06/10/24 1359   06/04/24 1800  cefTRIAXone  (ROCEPHIN ) 1 g in sodium chloride  0.9 % 100 mL IVPB        1 g 200 mL/hr over 30 Minutes Intravenous Every 24 hours 06/04/24 1718 06/06/24 1818  Subjective: Patient seen and examined at bedside today.    Lying on bed.  Appears weak and deconditioned but comfortable.  On 2 L of oxygen per minute.  Denies any worsening shortness of breath or cough.  Still complaining of right hip pain and right ankle pain.  We discussed the finding of x-rays. Had  a bowel movement  this morning  Objective: Vitals:   06/07/24 2023 06/07/24 2044 06/08/24 0530 06/08/24 0906  BP: (!) 128/111  (!) 168/68   Pulse: 71  68   Resp: 16  16   Temp: 97.7 F (36.5 C)  97.9 F (36.6 C)   TempSrc: Oral  Oral   SpO2: 97% 96% 97% 94%  Weight:      Height:        Intake/Output Summary (Last 24 hours) at 06/08/2024 1036 Last data filed at 06/08/2024 9083 Gross per 24 hour  Intake 480 ml  Output 1500 ml  Net -1020 ml   Filed Weights   06/04/24 1156  Weight: 64 kg    Examination:  General exam: Overall comfortable, not in distress, pleasant elderly female HEENT: PERRL Respiratory system:  no wheezes or crackles, mildly diminished sounds bilaterally Cardiovascular system: S1 & S2 heard, RRR.  Gastrointestinal system: Abdomen is nondistended, soft and nontender. Central nervous system: Alert and oriented Extremities: No edema, no clubbing ,no cyanosis Skin: No rashes, no ulcers,no icterus     Data Reviewed: I have personally reviewed following labs and imaging studies  CBC: Recent Labs  Lab 06/04/24 1346 06/05/24 0806 06/08/24 0607  WBC 3.0* 3.2* 4.1  NEUTROABS 1.7  --   --   HGB 11.9* 10.9* 11.2*  HCT 35.4* 33.1* 35.0*  MCV 107.9* 111.8* 115.1*  PLT 128* 117* 113*   Basic Metabolic Panel: Recent Labs  Lab 06/04/24 1346 06/05/24 0806 06/06/24 0512 06/08/24 0607  NA 139 142 140 139  K 3.9 4.0 4.6 5.1  CL 103 111 111 107  CO2 21* 21* 21* 24  GLUCOSE 77 71 91 112*  BUN 30* 23 18 16   CREATININE 1.73* 1.39* 1.29* 0.81  CALCIUM  9.3 8.4* 8.3* 9.5  MG 2.3  --   --   --   PHOS 2.9  --   --   --      Recent Results (from the past 240 hours)  Urine Culture (for pregnant, neutropenic or urologic patients or patients with an indwelling urinary catheter)     Status: Abnormal   Collection Time: 06/04/24 12:36 AM   Specimen: Urine, Clean Catch  Result Value Ref Range Status   Specimen Description   Final    URINE, CLEAN  CATCH Performed at [facility], [address], [place], [place] [address]    Special Requests   Final    NONE Performed at [facility], [address], [place], [place] [address]    Culture   Final    Two isolates with different morphologies were identified as the same organism.The most resistant organism was reported. >=100,000 COLONIES/mL PROTEUS MIRABILIS    Report Status 06/08/2024 FINAL  Final   Organism ID, Bacteria PROTEUS MIRABILIS (A)  Final      Susceptibility   Proteus mirabilis - MIC*    AMPICILLIN <=2 SENSITIVE Sensitive     CEFAZOLIN  (URINE) Value in next row Sensitive      4 SENSITIVEThis is a modified FDA-approved test that has been validated and its performance characteristics determined by the reporting laboratory.  This laboratory is certified under the Clinical Laboratory Improvement Amendments CLIA as qualified to perform high complexity clinical laboratory testing.    CEFEPIME  Value in next row Sensitive      4 SENSITIVEThis is a modified FDA-approved test that has been validated and its performance characteristics determined by the reporting laboratory.  This laboratory is certified under the Clinical Laboratory Improvement Amendments CLIA as qualified to perform high complexity clinical laboratory testing.    ERTAPENEM Value in next row Sensitive      4 SENSITIVEThis is a modified FDA-approved test that has been validated and its performance characteristics determined by the reporting laboratory.  This laboratory is certified under the Clinical Laboratory Improvement Amendments CLIA as qualified to perform high complexity clinical laboratory testing.    CEFTRIAXONE  Value in next row Sensitive      4 SENSITIVEThis is a modified FDA-approved test that has been validated and its performance characteristics determined by the reporting laboratory.  This laboratory is certified under the Clinical Laboratory Improvement Amendments CLIA  as qualified to perform high complexity clinical laboratory testing.    CIPROFLOXACIN Value in next row Sensitive      4 SENSITIVEThis is a modified FDA-approved test that has been validated and its performance characteristics determined by the reporting laboratory.  This laboratory is certified under the Clinical Laboratory Improvement Amendments CLIA as qualified to perform high complexity clinical laboratory testing.    GENTAMICIN Value in next row Sensitive      4 SENSITIVEThis is a modified FDA-approved test that has been validated and its performance characteristics determined by the reporting laboratory.  This laboratory is certified under the Clinical Laboratory Improvement Amendments CLIA as qualified to perform high complexity clinical laboratory testing.    NITROFURANTOIN Value in next row Resistant      4 SENSITIVEThis is a modified FDA-approved test that has been validated and its performance characteristics determined by the reporting laboratory.  This laboratory is certified under the Clinical Laboratory Improvement Amendments CLIA as qualified to perform high complexity clinical laboratory testing.    TRIMETH/SULFA Value in next row Sensitive      4 SENSITIVEThis is a modified FDA-approved test that has been validated and its performance characteristics determined by the reporting laboratory.  This laboratory is certified under the Clinical Laboratory Improvement Amendments CLIA as qualified to perform high complexity clinical laboratory testing.    AMPICILLIN/SULBACTAM Value in next row Sensitive      4 SENSITIVEThis is a modified FDA-approved test that has been validated and its performance characteristics determined by the reporting laboratory.  This laboratory is certified under the Clinical Laboratory Improvement Amendments CLIA as qualified to perform high complexity clinical laboratory testing.    PIP/TAZO Value in next row Sensitive      <=4 SENSITIVEThis is a modified  FDA-approved test that has been validated and its performance characteristics determined by the reporting laboratory.  This laboratory is certified under the Clinical Laboratory Improvement Amendments CLIA as qualified to perform high complexity clinical laboratory testing.    MEROPENEM Value in next row Sensitive      <=4 SENSITIVEThis is a modified FDA-approved test that has been validated and its performance characteristics determined by the reporting laboratory.  This laboratory is certified under the Clinical Laboratory Improvement Amendments CLIA as qualified to perform high complexity clinical laboratory testing.    * >=100,000 COLONIES/mL PROTEUS MIRABILIS     Radiology Studies: DG HIP UNILAT WITH PELVIS 2-3 VIEWS RIGHT Result Date: 06/07/2024 EXAM: 2 or  more VIEW(S) XRAY OF THE BILATERAL HIP 06/07/2024 10:16:00 AM COMPARISON: 03/04/2024 CLINICAL HISTORY: Right hip pain FINDINGS: BONES AND JOINTS: No acute fracture or focal osseous lesion. Moderate to advanced degenerative arthropathy of both hips. Degeneration worse on the right. SOFT TISSUES: Iliofemoral atherosclerosis and bilateral iliac stents noted. LUMBAR SPINE: Lower lumbar degenerative disc disease. IMPRESSION: 1. No acute abnormality. 2. Moderate to advanced degenerative arthropathy of both hips, worse on the right. Electronically signed by: Norman Gatlin MD 06/07/2024 06:49 PM EST RP Workstation: [id number]   DG Ankle 2 Views Right Result Date: 06/07/2024 EXAM: 2 VIEW(S) XRAY OF THE RIGHT ANKLE 06/07/2024 10:16:00 AM CLINICAL HISTORY: Right ankle pain COMPARISON: 05/09/2022 FINDINGS: BONES AND JOINTS: Demineralization. Remote posttraumatic changes about the distal fibula and tibia. Mild osteoarthritis of the midfoot and hindfoot. No acute fracture. No focal osseous lesion. No joint dislocation. SOFT TISSUES: The soft tissues are unremarkable. IMPRESSION: 1. No acute abnormality. Electronically signed by: Norman Gatlin MD 06/07/2024  06:48 PM EST RP Workstation: [id number]     Scheduled Meds:  busPIRone   10 mg Oral TID   cephALEXin   500 mg Oral Q8H   citalopram   20 mg Oral QHS   clopidogrel   75 mg Oral Daily   divalproex   250 mg Oral BID   enoxaparin  (LOVENOX ) injection  30 mg Subcutaneous Q24H   gabapentin   100 mg Oral TID   ipratropium-albuterol   3 mL Nebulization Q6H WA   pantoprazole   40 mg Oral BID   predniSONE   40 mg Oral Q breakfast   umeclidinium-vilanterol  1 puff Inhalation Daily   Continuous Infusions:     LOS: 0 days   Ivonne Mustache, MD [facility] P11/30/2025, 10:36 AM

## 2024-06-09 ENCOUNTER — Encounter (HOSPITAL_COMMUNITY): Payer: Self-pay | Admitting: Internal Medicine

## 2024-06-09 ENCOUNTER — Observation Stay (HOSPITAL_COMMUNITY)

## 2024-06-09 DIAGNOSIS — N179 Acute kidney failure, unspecified: Principal | ICD-10-CM

## 2024-06-09 MED ORDER — GABAPENTIN 100 MG PO CAPS
200.0000 mg | ORAL_CAPSULE | Freq: Three times a day (TID) | ORAL | Status: DC
  Administered 2024-06-09 – 2024-06-12 (×9): 200 mg via ORAL
  Filled 2024-06-09 (×9): qty 2

## 2024-06-09 MED ORDER — METHOCARBAMOL 500 MG PO TABS
500.0000 mg | ORAL_TABLET | Freq: Four times a day (QID) | ORAL | Status: DC | PRN
  Administered 2024-06-09: 500 mg via ORAL
  Filled 2024-06-09: qty 1

## 2024-06-09 MED ORDER — GADOBUTROL 1 MMOL/ML IV SOLN
6.0000 mL | Freq: Once | INTRAVENOUS | Status: AC | PRN
  Administered 2024-06-09: 6 mL via INTRAVENOUS

## 2024-06-09 MED ORDER — LIDOCAINE 5 % EX PTCH
1.0000 | MEDICATED_PATCH | CUTANEOUS | Status: DC
  Administered 2024-06-09 – 2024-06-11 (×3): 1 via TRANSDERMAL
  Filled 2024-06-09 (×4): qty 1

## 2024-06-09 NOTE — Plan of Care (Signed)

## 2024-06-09 NOTE — TOC Progression Note (Signed)
 Transition of Care [facility]) - Progression Note    Patient Details  Name: [name] [name] MRN: [id number] Date of Birth: 1941/06/15  Transition of Care [facility]) CM/SW Contact  [name], RN Phone Number:[phone]  06/09/2024, 4:24 PM  Clinical Narrative:    [name] remains pending. Corrections needed on FL2 and MD signature on 30 day note. Will correct and upload.    Expected Discharge Plan: Skilled Nursing Facility Barriers to Discharge: Continued Medical Work up               Expected Discharge Plan and Services   Discharge Planning Services: CM Consult Post Acute Care Choice: Skilled Nursing Facility Living arrangements for the past 2 months: Single Family Home                                       Social Drivers of Health (SDOH) Interventions SDOH Screenings   Food Insecurity: Food Insecurity Present (06/04/2024)  Housing: High Risk (06/04/2024)  Transportation Needs: No Transportation Needs (06/04/2024)  Utilities: Not At Risk (06/04/2024)  Alcohol  Screen: Low Risk  (10/13/2022)  Depression (PHQ2-9): Medium Risk (05/20/2024)  Financial Resource Strain: Low Risk  (10/13/2022)  Physical Activity: Insufficiently Active (10/13/2022)  Social Connections: Moderately Integrated (06/04/2024)  Stress: Stress Concern Present (10/13/2022)  Tobacco Use: Medium Risk (06/04/2024)    Readmission Risk Interventions    06/08/2023    1:37 PM 07/26/2022   12:20 PM 05/09/2022    3:18 PM  Readmission Risk Prevention Plan  Post Dischage Appt  Complete Complete  Medication Screening  Complete Complete  Transportation Screening Complete Complete   PCP or Specialist Appt within 5-7 Days Complete    Home Care Screening Complete    Medication Review (RN CM) Complete

## 2024-06-09 NOTE — Progress Notes (Signed)
 PROGRESS NOTE  [name] [name]  FMW:[id number] DOB: 11/22/1940 DOA: 06/04/2024 PCP: [name], DO   Brief Narrative: Patient is a 83 year old female with history of CKD stage IIIa, congenital absence of 1 kidney, hypertension, hyperlipidemia, recently diagnosed stage I lung adenocarcinoma status post SBRT, COPD on 2 L of oxygen at home intermittently who presented with complaint of nausea, vomiting, diarrhea, poor oral intake for past week, progressive weakness, difficulty with ambulation, crampy abdominal discomfort, unable to tolerate food, dysuria.  On presentation, she was hemodynamically stable.  Lab work showed creatinine of 1.7.  UA suggestive  of UTI.  Started on antibiotic, IV fluid, urine culture pending.  PT recommending SNF on discharge,TOC following. Plan for MRI of right hip  Assessment & Plan:  Active Problems:   AKI (acute kidney injury)  AKI on CKD stage IIIa: Baseline creatinine around 1.3.  Presented with creatinine of 1.7.  Likely from poor oral intake, also uses Lasix  at home.    Monitor renal function.  Avoid nephrotoxins.  Kidney function back to baseline.  IV fluid stopped  Abdomen pain, nausea, vomiting, diarrhea: Unclear etiology.  Could be associated  with UTI.  CT abdomen/pelvis did not show any acute findings.  Given supportive care, antiemetics, IV fluids.  Abdomen benign examination .  Denies any abdomen pain, nausea or vomiting today.  Suspected UTI: Endorsed dysuria on admission.  Urine culture showed Proteus, pansensitive.  Antibiotic changed to oral.  Plan for total 5 days course including IV antibiotics  Hypertension: Home medications on hold.  Takes Norvasc , furosemide  at home.BP stable  COPD/chronic hypoxic respiratory failure: Currently not in exacerbation.  On 2 L of oxygen at home intermittently.  Continue bronchodilators as needed.  Currently requiring 2 L.  Chest x-ray did not show any acute findings.  Will attempt to wean the oxygen.   Continue   prednisone  to finish 5 days course.  Stage I lung adenocarcinoma: Status post SBRT.  Recently diagnosed.  Follows with Dr. Sherrod  Pancytopenia: Recent B12 level optimal.  Monitor CBC  History of peripheral vascular disease: Status post right external iliac/left common iliac stenting.  Follows with vascular surgery.  Continue home medications  Depression: Continue home medications.  Right hip pain/right ankle pain: Report of falling about 2 months ago.  X-ray of the right hip/right ankle did not show any acute but showed moderate to advanced  degenerative arthropathy of both hips, worse on the right.  Continues to complain of right hip pain.  Ordered lidocaine  patch, Robaxin .  Doubled the dose of gabapentin  to 200 mg 3 times a day.  Getting MRI of the right  hip.  Weakness/deconditioning/failure to thrive/fall: Lives alone in a trailer house.  Does not have heat or  AC.  TOC consulted.  PT/OT evaluation requested.  She requests long-term placement in a nursing facility. Reported falling about 2 months ago     DVT prophylaxis:enoxaparin  (LOVENOX ) injection 40 mg Start: 06/08/24 2200     Code Status: Do not attempt resuscitation (DNR) PRE-ARREST INTERVENTIONS DESIRED  Family Communication: None at bedside.  Patient says she is not in contact with many of her family members  Patient status:Obs  Patient is from :Home  Anticipated discharge to:SnF  Estimated DC date: Whenever possible   Consultants: None  Procedures:None  Antimicrobials:  Anti-infectives (From admission, onward)    Start     Dose/Rate Route Frequency Ordered Stop   06/08/24 1100  cephALEXin  (KEFLEX ) capsule 500 mg  500 mg Oral Every 8 hours 06/08/24 1010 06/10/24 1359   06/04/24 1800  cefTRIAXone  (ROCEPHIN ) 1 g in sodium chloride  0.9 % 100 mL IVPB        1 g 200 mL/hr over 30 Minutes Intravenous Every 24 hours 06/04/24 1718 06/06/24 1818       Subjective: Patient seen and examined at bedside  today.  Comfortable.  Lying on bed.  On 2 L of oxygen.  Denies any shortness of breath or cough.  Complains of right hip pain.  Today is her birthday  Objective: Vitals:   06/08/24 1929 06/08/24 2029 06/09/24 0245 06/09/24 0522  BP:  (!) 145/61  (!) 141/71  Pulse:  90  80  Resp:    16  Temp:  97.8 F (36.6 C)  97.8 F (36.6 C)  TempSrc:  Oral  Oral  SpO2: 97% 97% 97% 95%  Weight:      Height:        Intake/Output Summary (Last 24 hours) at 06/09/2024 1129 Last data filed at 06/09/2024 1036 Gross per 24 hour  Intake 840 ml  Output 1400 ml  Net -560 ml   Filed Weights   06/04/24 1156  Weight: 64 kg    Examination:  General exam: Overall comfortable, not in distress, pleasant elderly female HEENT: PERRL Respiratory system:  no wheezes or crackles  Cardiovascular system: S1 & S2 heard, RRR.  Gastrointestinal system: Abdomen is nondistended, soft and nontender. Central nervous system: Alert and oriented Extremities: No edema, no clubbing ,no cyanosis Skin: No rashes, no ulcers,no icterus       Data Reviewed: I have personally reviewed following labs and imaging studies  CBC: Recent Labs  Lab 06/04/24 1346 06/05/24 0806 06/08/24 0607  WBC 3.0* 3.2* 4.1  NEUTROABS 1.7  --   --   HGB 11.9* 10.9* 11.2*  HCT 35.4* 33.1* 35.0*  MCV 107.9* 111.8* 115.1*  PLT 128* 117* 113*   Basic Metabolic Panel: Recent Labs  Lab 06/04/24 1346 06/05/24 0806 06/06/24 0512 06/08/24 0607  NA 139 142 140 139  K 3.9 4.0 4.6 5.1  CL 103 111 111 107  CO2 21* 21* 21* 24  GLUCOSE 77 71 91 112*  BUN 30* 23 18 16   CREATININE 1.73* 1.39* 1.29* 0.81  CALCIUM  9.3 8.4* 8.3* 9.5  MG 2.3  --   --   --   PHOS 2.9  --   --   --      Recent Results (from the past 240 hours)  Urine Culture (for pregnant, neutropenic or urologic patients or patients with an indwelling urinary catheter)     Status: Abnormal   Collection Time: 06/04/24 12:36 AM   Specimen: Urine, Clean Catch  Result Value  Ref Range Status   Specimen Description   Final    URINE, CLEAN CATCH Performed at [facility], [address], [place], [place] [address]    Special Requests   Final    NONE Performed at [facility], [address], [place], [place] [address]    Culture   Final    Two isolates with different morphologies were identified as the same organism.The most resistant organism was reported. >=100,000 COLONIES/mL PROTEUS MIRABILIS    Report Status 06/08/2024 FINAL  Final   Organism ID, Bacteria PROTEUS MIRABILIS (A)  Final      Susceptibility   Proteus mirabilis - MIC*    AMPICILLIN <=2 SENSITIVE Sensitive     CEFAZOLIN  (URINE) Value  in next row Sensitive      4 SENSITIVEThis is a modified FDA-approved test that has been validated and its performance characteristics determined by the reporting laboratory.  This laboratory is certified under the Clinical Laboratory Improvement Amendments CLIA as qualified to perform high complexity clinical laboratory testing.    CEFEPIME  Value in next row Sensitive      4 SENSITIVEThis is a modified FDA-approved test that has been validated and its performance characteristics determined by the reporting laboratory.  This laboratory is certified under the Clinical Laboratory Improvement Amendments CLIA as qualified to perform high complexity clinical laboratory testing.    ERTAPENEM Value in next row Sensitive      4 SENSITIVEThis is a modified FDA-approved test that has been validated and its performance characteristics determined by the reporting laboratory.  This laboratory is certified under the Clinical Laboratory Improvement Amendments CLIA as qualified to perform high complexity clinical laboratory testing.    CEFTRIAXONE  Value in next row Sensitive      4 SENSITIVEThis is a modified FDA-approved test that has been validated and its performance characteristics determined by the reporting laboratory.  This laboratory is  certified under the Clinical Laboratory Improvement Amendments CLIA as qualified to perform high complexity clinical laboratory testing.    CIPROFLOXACIN Value in next row Sensitive      4 SENSITIVEThis is a modified FDA-approved test that has been validated and its performance characteristics determined by the reporting laboratory.  This laboratory is certified under the Clinical Laboratory Improvement Amendments CLIA as qualified to perform high complexity clinical laboratory testing.    GENTAMICIN Value in next row Sensitive      4 SENSITIVEThis is a modified FDA-approved test that has been validated and its performance characteristics determined by the reporting laboratory.  This laboratory is certified under the Clinical Laboratory Improvement Amendments CLIA as qualified to perform high complexity clinical laboratory testing.    NITROFURANTOIN Value in next row Resistant      4 SENSITIVEThis is a modified FDA-approved test that has been validated and its performance characteristics determined by the reporting laboratory.  This laboratory is certified under the Clinical Laboratory Improvement Amendments CLIA as qualified to perform high complexity clinical laboratory testing.    TRIMETH/SULFA Value in next row Sensitive      4 SENSITIVEThis is a modified FDA-approved test that has been validated and its performance characteristics determined by the reporting laboratory.  This laboratory is certified under the Clinical Laboratory Improvement Amendments CLIA as qualified to perform high complexity clinical laboratory testing.    AMPICILLIN/SULBACTAM Value in next row Sensitive      4 SENSITIVEThis is a modified FDA-approved test that has been validated and its performance characteristics determined by the reporting laboratory.  This laboratory is certified under the Clinical Laboratory Improvement Amendments CLIA as qualified to perform high complexity clinical laboratory testing.    PIP/TAZO Value in  next row Sensitive      <=4 SENSITIVEThis is a modified FDA-approved test that has been validated and its performance characteristics determined by the reporting laboratory.  This laboratory is certified under the Clinical Laboratory Improvement Amendments CLIA as qualified to perform high complexity clinical laboratory testing.    MEROPENEM Value in next row Sensitive      <=4 SENSITIVEThis is a modified FDA-approved test that has been validated and its performance characteristics determined by the reporting laboratory.  This laboratory is certified under the Clinical Laboratory Improvement Amendments CLIA as qualified to perform high complexity clinical  laboratory testing.    * >=100,000 COLONIES/mL PROTEUS MIRABILIS     Radiology Studies: No results found.    Scheduled Meds:  busPIRone   10 mg Oral TID   cephALEXin   500 mg Oral Q8H   citalopram   20 mg Oral QHS   clopidogrel   75 mg Oral Daily   divalproex   250 mg Oral BID   enoxaparin  (LOVENOX ) injection  40 mg Subcutaneous Q24H   gabapentin   200 mg Oral TID   ipratropium-albuterol   3 mL Nebulization Q6H WA   lidocaine   1 patch Transdermal Q24H   pantoprazole   40 mg Oral BID   predniSONE   40 mg Oral Q breakfast   umeclidinium-vilanterol  1 puff Inhalation Daily   Continuous Infusions:     LOS: 0 days   Ivonne Mustache, MD [facility] P12/07/2023, 11:29 AM

## 2024-06-09 NOTE — Plan of Care (Signed)
  Problem: Education: Goal: Knowledge of General Education information will improve Description Including pain rating scale, medication(s)/side effects and non-pharmacologic comfort measures Outcome: Progressing   Problem: Health Behavior/Discharge Planning: Goal: Ability to manage health-related needs will improve Outcome: Progressing   Problem: Nutrition: Goal: Adequate nutrition will be maintained Outcome: Progressing   Problem: Elimination: Goal: Will not experience complications related to bowel motility Outcome: Progressing   

## 2024-06-09 NOTE — Progress Notes (Signed)
 Physical Therapy Treatment Patient Details Name: [name] [name] MRN: [id number] DOB: October 22, 1940 Today's Date: 06/09/2024   History of Present Illness Patient is an 83 y/o female admitted 06/04/24 due to nausea, vomiting, diarrhea and poor p.o intake.  Found to have positive urinalysis and weakness/FTT.  PMH positive for CKD III (absence of one kidney), depression, HTN, GERD, PVD, PMR, sleep apnea, glaucoma, PNA, lung cancer.    PT Comments  HAPPY BIRTHDAY AxO x 3 pleasant Lady following all commands. Assisted OOB to recliner was difficult and required + 2 assist. Pt lives home alone and uses a WC to propel self but was able to transfer self.  LPT has rec Pt will need ST Rehab at SNF to address mobility and functional decline prior to safely returning home.     If plan is discharge home, recommend the following: Two people to help with walking and/or transfers;Help with stairs or ramp for entrance;Assistance with cooking/housework;A lot of help with bathing/dressing/bathroom   Can travel by private vehicle     No  Equipment Recommendations  None recommended by PT    Recommendations for Other Services       Precautions / Restrictions Precautions Precautions: Fall Restrictions Weight Bearing Restrictions Per Provider Order: No Other Position/Activity Restrictions: chronic R hip ankle pain     Mobility  Bed Mobility Overal bed mobility: Needs Assistance Bed Mobility: Rolling Rolling: Mod assist         General bed mobility comments: Required increased time and effort, due to R hip pain    Transfers Overall transfer level: Needs assistance   Transfers: Bed to chair/wheelchair/BSC   Stand pivot transfers: Max assist, +2 physical assistance, +2 safety/equipment         General transfer comment: Pt required + 2 side by side assist with difficulty supporting her weight and reqiring increased assist to complete 1/4 turn.    Ambulation/Gait               General  Gait Details: transfer only due to weakness   Stairs             Wheelchair Mobility     Tilt Bed    Modified Rankin (Stroke Patients Only)       Balance                                            Communication Communication Communication: No apparent difficulties  Cognition Arousal: Alert Behavior During Therapy: Anxious   PT - Cognitive impairments: No apparent impairments                       PT - Cognition Comments: AxO x 3 pleasant Lady following all commands. Following commands: Intact Following commands impaired: Follows one step commands with increased time    Cueing Cueing Techniques: Verbal cues  Exercises      General Comments        Pertinent Vitals/Pain Pain Assessment Pain Assessment: Faces Faces Pain Scale: Hurts little more Pain Location: R hip with mobility Pain Descriptors / Indicators: Aching, Grimacing, Guarding Pain Intervention(s): Monitored during session, Repositioned    Home Living                          Prior Function            PT  Goals (current goals can now be found in the care plan section) Progress towards PT goals: Progressing toward goals    Frequency    Min 2X/week      PT Plan      Co-evaluation              AM-PAC PT 6 Clicks Mobility   Outcome Measure  Help needed turning from your back to your side while in a flat bed without using bedrails?: A Lot Help needed moving from lying on your back to sitting on the side of a flat bed without using bedrails?: A Lot Help needed moving to and from a bed to a chair (including a wheelchair)?: A Lot Help needed standing up from a chair using your arms (e.g., wheelchair or bedside chair)?: A Lot Help needed to walk in hospital room?: Total Help needed climbing 3-5 steps with a railing? : Total 6 Click Score: 10    End of Session Equipment Utilized During Treatment: Gait belt       PT Visit Diagnosis:  Other abnormalities of gait and mobility (R26.89);History of falling (Z91.81)     Time: 1001-1020 PT Time Calculation (min) (ACUTE ONLY): 19 min  Charges:    $Therapeutic Activity: 8-22 mins PT General Charges $$ ACUTE PT VISIT: 1 Visit                     Katheryn Leap  PTA Acute  Rehabilitation Services Office M-F          [phone]

## 2024-06-10 DIAGNOSIS — N179 Acute kidney failure, unspecified: Secondary | ICD-10-CM | POA: Diagnosis not present

## 2024-06-10 MED ORDER — IPRATROPIUM-ALBUTEROL 0.5-2.5 (3) MG/3ML IN SOLN
3.0000 mL | Freq: Three times a day (TID) | RESPIRATORY_TRACT | Status: DC
  Administered 2024-06-10 – 2024-06-12 (×7): 3 mL via RESPIRATORY_TRACT
  Filled 2024-06-10 (×7): qty 3

## 2024-06-10 NOTE — Progress Notes (Signed)
 Occupational Therapy Treatment Patient Details Name: [name] [name] MRN: [id number] DOB: 1941/01/25 Today's Date: 06/10/2024   History of present illness Patient is an 83 y/o female admitted 06/04/24 due to nausea, vomiting, diarrhea and poor p.o intake.  Found to have positive urinalysis and weakness/FTT.  PMH positive for CKD III (absence of one kidney), depression, HTN, GERD, PVD, PMR, sleep apnea, glaucoma, PNA, lung cancer.   OT comments  Patient seen for skilled OT session this afternoon. Pain in R hip continues to limit activity tolerance for ADL and mobility however patient open to all therapy presented and put forth good effort for multiple STSs and side stepping following EOB ADL progression. See below for current status. Patient will benefit from continued inpatient follow up therapy, <3 hours/day. Patient requires continued Acute care hospital level OT services to progress safety and functional performance and allow for discharge.        If plan is discharge home, recommend the following:  A lot of help with walking and/or transfers;A lot of help with bathing/dressing/bathroom;Help with stairs or ramp for entrance;Assistance with [employer] (measurements OT);Wheelchair cushion (measurements OT) (patient reports her wheelchair is old and borrowed)       Precautions / Restrictions Precautions Precautions: Fall Restrictions Weight Bearing Restrictions Per Provider Order: No Other Position/Activity Restrictions: chronic R hip ankle pain       Mobility Bed Mobility Overal bed mobility: Needs Assistance Bed Mobility: Rolling Rolling: Mod assist   Supine to sit: Min assist Sit to supine: Mod assist   General bed mobility comments: Required increased time and effort and support for LE management due to R hip pain    Transfers Overall transfer level: Needs assistance   Transfers: Sit to/from Stand Sit to Stand: Max assist, From  elevated surface           General transfer comment: STS x 6 trials from EOB with RW and then lateral stepping to [facility]     Balance Overall balance assessment: Needs assistance Sitting-balance support: Feet supported Sitting balance-Leahy Scale: Fair     Standing balance support: Reliant on assistive device for balance Standing balance-Leahy Scale: Poor Standing balance comment: heavy assist for STSs                           ADL either performed or assessed with clinical judgement   ADL Overall ADL's : Needs assistance/impaired Eating/Feeding: Set up;Sitting   Grooming: Wash/dry hands;Wash/dry face;Oral care;Contact guard assist;Sitting Grooming Details (indicate cue type and reason): EOB Upper Body Bathing: Minimal assistance;Sitting   Lower Body Bathing: Maximal assistance;Bed level   Upper Body Dressing : Moderate assistance;Bed level   Lower Body Dressing: Maximal assistance;Bed level   Toilet Transfer:  (deferred due to use of bed pan earlier and Purewick currently)   Toileting- Clothing Manipulation and Hygiene: Maximal assistance;Bed level       Functional mobility during ADLs: Rolling walker (2 wheels);+2 for physical assistance;+2 for safety/equipment;Maximal assistance General ADL Comments: worked EOB and STS level for simple BADL's with good effort despite R hip pain    Extremity/Trunk Assessment Upper Extremity Assessment Upper Extremity Assessment: Right hand dominant;Generalized weakness RUE Deficits / Details: AROM WFL. Grip strength 4-/5 LUE Deficits / Details: L shoulder painful with elevation. Grip strength 4-/5   Lower Extremity Assessment Lower Extremity Assessment: Defer to PT evaluation  Communication Communication Communication: No apparent difficulties   Cognition Arousal: Alert Behavior During Therapy: WFL for tasks assessed/performed Cognition: No apparent impairments             OT - Cognition  Comments: Oriented x4, able to recall course of medical care, events of birthday yesterday and direct care well with good insight and no impulsivity                 Following commands: Intact Following commands impaired: Follows one step commands with increased time      Cueing   Cueing Techniques: Verbal cues        General Comments 2 ltrs O2 via [place] with RR 18 and no SOB during visit, R hip pain impacts all activity tolerance, B lower leg discoloration and flaking dry skin    Pertinent Vitals/ Pain       Pain Assessment Pain Assessment: Faces Faces Pain Scale: Hurts even more Pain Location: R hip with mobility Pain Descriptors / Indicators: Aching, Grimacing, Guarding Pain Intervention(s): Monitored during session, Repositioned, Patient requesting pain meds-RN notified, Other (comment) (moved to sidelying post tx with releif)   Frequency  Min 2X/week        Progress Toward Goals  OT Goals(current goals can now be found in the care plan section)  Progress towards OT goals: Progressing toward goals  Acute Rehab OT Goals Patient Stated Goal: to get this hip better OT Goal Formulation: With patient Time For Goal Achievement: 06/20/24 Potential to Achieve Goals: Good ADL Goals Pt Will Perform Eating: with set-up;with supervision;with adaptive utensils;sitting Pt Will Perform Grooming: with set-up;with supervision;sitting;with adaptive equipment Pt Will Perform Upper Body Dressing: with set-up;with supervision;sitting Pt Will Transfer to Toilet: with supervision;stand pivot transfer;bedside commode;squat pivot transfer Additional ADL Goal #1: The patient will perform bed mobility with supervision, in prep for progressive ADL participation.  Plan         AM-PAC OT 6 Clicks Daily Activity     Outcome Measure   Help from another person eating meals?: A Little Help from another person taking care of personal grooming?: A Little Help from another person toileting,  which includes using toliet, bedpan, or urinal?: A Lot Help from another person bathing (including washing, rinsing, drying)?: A Lot Help from another person to put on and taking off regular upper body clothing?: A Lot Help from another person to put on and taking off regular lower body clothing?: A Lot 6 Click Score: 14    End of Session Equipment Utilized During Treatment: Other (comment) (N/A)  OT Visit Diagnosis: Unsteadiness on feet (R26.81);Other abnormalities of gait and mobility (R26.89);Muscle weakness (generalized) (M62.81);History of falling (Z91.81);Low vision, both eyes (H54.2);Feeding difficulties (R63.3);Pain Pain - Right/Left:  (R hip) Pain - part of body: Hip   Activity Tolerance Patient limited by pain   Patient Left in bed;with call bell/phone within reach;with bed alarm set;with family/visitor present   Nurse Communication Mobility status        Time: 8593-8564 OT Time Calculation (min): 29 min  Charges: OT General Charges $OT Visit: 1 Visit OT Treatments $Self Care/Home Management : 8-22 mins $Therapeutic Activity: 8-22 mins  Zakariyah Freimark OT/L Acute Rehabilitation Department  [phone]  06/10/2024, 3:44 PM

## 2024-06-10 NOTE — Progress Notes (Signed)
 PROGRESS NOTE  [name] [name]  FMW:[id number] DOB: 1941-05-01 DOA: 06/04/2024 PCP: [name], DO   Brief Narrative: Patient is a 83 year old female with history of CKD stage IIIa, congenital absence of 1 kidney, hypertension, hyperlipidemia, recently diagnosed stage I lung adenocarcinoma status post SBRT, COPD on 2 L of oxygen at home intermittently who presented with complaint of nausea, vomiting, diarrhea, poor oral intake for past week, progressive weakness, difficulty with ambulation, crampy abdominal discomfort, unable to tolerate food, dysuria.  On presentation, she was hemodynamically stable.  Lab work showed creatinine of 1.7.  UA suggestive  of UTI.  Started on antibiotic, IV fluid, urine culture showed Proteus.  She finished the course of antibiotic.  PT recommending SNF on discharge,TOC following.  Medically  stable for discharge   Assessment & Plan:  Active Problems:   AKI (acute kidney injury)  AKI on CKD stage IIIa: Baseline creatinine around 1.3.  Presented with creatinine of 1.7.  Likely from poor oral intake, also uses Lasix  at home.    Monitor renal function.  Avoid nephrotoxins.  Kidney function back to baseline.  IV fluid stopped  Abdomen pain, nausea, vomiting, diarrhea: Unclear etiology.  Could be associated  with UTI.  CT abdomen/pelvis did not show any acute findings.  Given antiemetics, IV fluids.  Abdomen benign on  examination .  Denies any abdomen pain, nausea or vomiting today.  UTI: Endorsed dysuria on admission.  Urine culture showed Proteus, pansensitive.  Antibiotic changed to oral.  Finished 5 days course including IV antibiotics  Hypertension: Home medications on hold.  Takes Norvasc , furosemide  at home.BP stable  COPD/chronic hypoxic respiratory failure: Currently not in exacerbation.  On 2 L of oxygen at home intermittently.  Continue bronchodilators as needed.  Currently requiring 2 L.  Chest x-ray did not show any acute findings.     Finished  5 days  course of prednisone .  Stage I lung adenocarcinoma: Status post SBRT.  Recently diagnosed.  Follows with Dr. Sherrod  Pancytopenia: Recent B12 level optimal.  Monitor CBC  History of peripheral vascular disease: Status post right external iliac/left common iliac stenting.  Follows with vascular surgery.  Continue home medication  Right hip pain/right ankle pain: Report of falling about 2 months ago.  X-ray of the right hip/right ankle did not show any acute but showed moderate to advanced  degenerative arthropathy of both hips, worse on the right.  Continued to complain of right hip pain.  Ordered lidocaine  patch, Robaxin .  Doubled the dose of gabapentin  to 200 mg 3 times a day.  MRI of the right  hip showed advanced osteoarthritis of right hip, complete full-thickness tear of the right gluteus minimus cuff insertion with surrounding edema .  Orthopedics, Dr. Reyne consulted.  He recommended outpatient follow-up and physical therapy.  Weakness/deconditioning/failure to thrive/fall: Lives alone in a trailer house.  Does not have heat or  AC.  TOC consulted.  PT/OT evaluation requested.  She requests long-term placement in a nursing facility. Reported falling about 2 months ago     DVT prophylaxis:enoxaparin  (LOVENOX ) injection 40 mg Start: 06/08/24 2200     Code Status: Do not attempt resuscitation (DNR) PRE-ARREST INTERVENTIONS DESIRED  Family Communication: None at bedside.  Patient says she is not in contact with many of her family members  Patient status:Obs  Patient is from :Home  Anticipated discharge to:SnF  Estimated DC date: Whenever possible   Consultants: None  Procedures:None  Antimicrobials:  Anti-infectives (From admission, onward)  Start     Dose/Rate Route Frequency Ordered Stop   06/08/24 1100  cephALEXin  (KEFLEX ) capsule 500 mg        500 mg Oral Every 8 hours 06/08/24 1010 06/10/24 0550   06/04/24 1800  cefTRIAXone  (ROCEPHIN ) 1 g in sodium chloride  0.9 %  100 mL IVPB        1 g 200 mL/hr over 30 Minutes Intravenous Every 24 hours 06/04/24 1718 06/06/24 1818       Subjective: Patient seen and examined at bedside today.  Hemodynamically stable.  Comfortable today.  She feels better after starting on her lidocaine  patch on right hip.  Denies any new complaints today.  Lying on bed  Objective: Vitals:   06/09/24 1413 06/09/24 2037 06/10/24 0525 06/10/24 0808  BP: 132/70 116/62 (!) 142/68   Pulse: 85 85 75   Resp: 20 14 14    Temp: 98.6 F (37 C) 97.8 F (36.6 C) 98.7 F (37.1 C)   TempSrc: Oral Oral Oral   SpO2: 98% 97% 97% 95%  Weight:      Height:        Intake/Output Summary (Last 24 hours) at 06/10/2024 1042 Last data filed at 06/10/2024 1003 Gross per 24 hour  Intake 840 ml  Output 1400 ml  Net -560 ml   Filed Weights   06/04/24 1156  Weight: 64 kg    Examination:   General exam: Overall comfortable, not in distress, pleasant elderly female, deconditioned HEENT: PERRL Respiratory system:  no wheezes or crackles, mild diminished sounds in bilateral bases Cardiovascular system: S1 & S2 heard, RRR.  Gastrointestinal system: Abdomen is nondistended, soft and nontender. Central nervous system: Alert and oriented Extremities: No edema, no clubbing ,no cyanosis Skin: No rashes, no ulcers,no icterus     Data Reviewed: I have personally reviewed following labs and imaging studies  CBC: Recent Labs  Lab 06/04/24 1346 06/05/24 0806 06/08/24 0607  WBC 3.0* 3.2* 4.1  NEUTROABS 1.7  --   --   HGB 11.9* 10.9* 11.2*  HCT 35.4* 33.1* 35.0*  MCV 107.9* 111.8* 115.1*  PLT 128* 117* 113*   Basic Metabolic Panel: Recent Labs  Lab 06/04/24 1346 06/05/24 0806 06/06/24 0512 06/08/24 0607  NA 139 142 140 139  K 3.9 4.0 4.6 5.1  CL 103 111 111 107  CO2 21* 21* 21* 24  GLUCOSE 77 71 91 112*  BUN 30* 23 18 16   CREATININE 1.73* 1.39* 1.29* 0.81  CALCIUM  9.3 8.4* 8.3* 9.5  MG 2.3  --   --   --   PHOS 2.9  --   --   --       Recent Results (from the past 240 hours)  Urine Culture (for pregnant, neutropenic or urologic patients or patients with an indwelling urinary catheter)     Status: Abnormal   Collection Time: 06/04/24 12:36 AM   Specimen: Urine, Clean Catch  Result Value Ref Range Status   Specimen Description   Final    URINE, CLEAN CATCH Performed at [facility], [address] [place], [place] [address]    Special Requests   Final    NONE Performed at [facility], [address] [place], [place] [address]    Culture   Final    Two isolates with different morphologies were identified as the same organism.The most resistant organism was reported. >=100,000 COLONIES/mL PROTEUS MIRABILIS    Report Status 06/08/2024 FINAL  Final   Organism ID,  Bacteria PROTEUS MIRABILIS (A)  Final      Susceptibility   Proteus mirabilis - MIC*    AMPICILLIN <=2 SENSITIVE Sensitive     CEFAZOLIN  (URINE) Value in next row Sensitive      4 SENSITIVEThis is a modified FDA-approved test that has been validated and its performance characteristics determined by the reporting laboratory.  This laboratory is certified under the Clinical Laboratory Improvement Amendments CLIA as qualified to perform high complexity clinical laboratory testing.    CEFEPIME  Value in next row Sensitive      4 SENSITIVEThis is a modified FDA-approved test that has been validated and its performance characteristics determined by the reporting laboratory.  This laboratory is certified under the Clinical Laboratory Improvement Amendments CLIA as qualified to perform high complexity clinical laboratory testing.    ERTAPENEM Value in next row Sensitive      4 SENSITIVEThis is a modified FDA-approved test that has been validated and its performance characteristics determined by the reporting laboratory.  This laboratory is certified under the Clinical Laboratory Improvement Amendments CLIA as qualified to  perform high complexity clinical laboratory testing.    CEFTRIAXONE  Value in next row Sensitive      4 SENSITIVEThis is a modified FDA-approved test that has been validated and its performance characteristics determined by the reporting laboratory.  This laboratory is certified under the Clinical Laboratory Improvement Amendments CLIA as qualified to perform high complexity clinical laboratory testing.    CIPROFLOXACIN Value in next row Sensitive      4 SENSITIVEThis is a modified FDA-approved test that has been validated and its performance characteristics determined by the reporting laboratory.  This laboratory is certified under the Clinical Laboratory Improvement Amendments CLIA as qualified to perform high complexity clinical laboratory testing.    GENTAMICIN Value in next row Sensitive      4 SENSITIVEThis is a modified FDA-approved test that has been validated and its performance characteristics determined by the reporting laboratory.  This laboratory is certified under the Clinical Laboratory Improvement Amendments CLIA as qualified to perform high complexity clinical laboratory testing.    NITROFURANTOIN Value in next row Resistant      4 SENSITIVEThis is a modified FDA-approved test that has been validated and its performance characteristics determined by the reporting laboratory.  This laboratory is certified under the Clinical Laboratory Improvement Amendments CLIA as qualified to perform high complexity clinical laboratory testing.    TRIMETH/SULFA Value in next row Sensitive      4 SENSITIVEThis is a modified FDA-approved test that has been validated and its performance characteristics determined by the reporting laboratory.  This laboratory is certified under the Clinical Laboratory Improvement Amendments CLIA as qualified to perform high complexity clinical laboratory testing.    AMPICILLIN/SULBACTAM Value in next row Sensitive      4 SENSITIVEThis is a modified FDA-approved test that has  been validated and its performance characteristics determined by the reporting laboratory.  This laboratory is certified under the Clinical Laboratory Improvement Amendments CLIA as qualified to perform high complexity clinical laboratory testing.    PIP/TAZO Value in next row Sensitive      <=4 SENSITIVEThis is a modified FDA-approved test that has been validated and its performance characteristics determined by the reporting laboratory.  This laboratory is certified under the Clinical Laboratory Improvement Amendments CLIA as qualified to perform high complexity clinical laboratory testing.    MEROPENEM Value in next row Sensitive      <=4 SENSITIVEThis is a modified FDA-approved test  that has been validated and its performance characteristics determined by the reporting laboratory.  This laboratory is certified under the Clinical Laboratory Improvement Amendments CLIA as qualified to perform high complexity clinical laboratory testing.    * >=100,000 COLONIES/mL PROTEUS MIRABILIS     Radiology Studies: MR HIP RIGHT W WO CONTRAST Result Date: 06/10/2024 CLINICAL DATA:  Recent falls.  Right hip pain. EXAM: MRI OF THE RIGHT HIP WITHOUT AND WITH CONTRAST TECHNIQUE: Multiplanar, multisequence MR imaging was performed both before and after administration of intravenous contrast. CONTRAST:  6mL GADAVIST GADOBUTROL 1 MMOL/ML IV SOLN COMPARISON:  Right hip radiographs dated 06/07/2024. CT abdomen/pelvis dated 06/04/2024. FINDINGS: Soft tissue and Muscle: Mild asymmetric fatty atrophy of the right gluteal musculature relative to the left. Essentially complete full-thickness tear of the right gluteus minimus cuff insertion with surrounding edema. The right gluteus medius cuff insertion is intact. Hamstring tendon origins are intact. Bones: No acute fracture or dislocation. No avascular necrosis. No aggressive osseous lesion. Sacroiliac joints and pubic symphysis are anatomically aligned with mild degenerative  changes. Degenerative changes of the visualized lower lumbar spine. Hip: The hip joint is anatomically aligned. Advanced joint space narrowing of the superior weight-bearing aspect of the right hip resulting in bone-on-bone contact with subchondral edema of the superior acetabulum and femoral head. Diffuse high-grade thinning of the superior weight-bearing articular cartilage. Diffuse labral degeneration. No joint effusion. Other: No enlarged lymph nodes identified in the field of view. Visualized intra-abdominal contents demonstrate no acute abnormality. Sigmoid colonic diverticulosis. IMPRESSION: 1. No acute osseous abnormality. 2. Advanced osteoarthritis of the right hip resulting in bone-on-bone contact with subchondral edema. 3. Diffuse labral degeneration. 4. Essentially complete full-thickness tear of the right gluteus minimus cuff insertion with surrounding edema. Electronically Signed   By: Harrietta Sherry M.D.   On: 06/10/2024 09:25      Scheduled Meds:  busPIRone   10 mg Oral TID   citalopram   20 mg Oral QHS   clopidogrel   75 mg Oral Daily   divalproex   250 mg Oral BID   enoxaparin  (LOVENOX ) injection  40 mg Subcutaneous Q24H   gabapentin   200 mg Oral TID   ipratropium-albuterol   3 mL Nebulization TID   lidocaine   1 patch Transdermal Q24H   pantoprazole   40 mg Oral BID   umeclidinium-vilanterol  1 puff Inhalation Daily   Continuous Infusions:     LOS: 0 days   Ivonne Mustache, MD [facility] P12/08/2023, 10:42 AM

## 2024-06-10 NOTE — NC FL2 (Deleted)
 [place]  MEDICAID [id number] LEVEL OF CARE FORM     IDENTIFICATION  Patient Name: [name] [name] Birthdate: 07/21/1940 Sex: Female Admission Date (Current Location): 06/04/2024  [facility] and [place] Number:  Producer, Television/film/video and Address:  [facility],  [address] [place], [place] [address]      Provider Number: [id number]  Attending Physician Name and Address:  Jillian Buttery, MD  Relative Name and Phone Number:  Sidra Hunt(Grandson)[phone]    Current Level of Care: Hospital Recommended Level of Care: Skilled Nursing Facility Prior Approval Number:    Date Approved/Denied:   PASRR Number:    Discharge Plan: SNF    Current Diagnoses: Patient Active Problem List   Diagnosis Date Noted   AKI (acute kidney injury) 06/04/2024   Hypoglycemia 03/05/2024   Generalized weakness 03/05/2024   Hypomagnesemia 03/05/2024   Leukopenia 03/05/2024   Lung cancer (HCC) 03/05/2024   GAD (generalized anxiety disorder) 03/05/2024   Depression 03/05/2024   Primary malignant neoplasm of bronchus of left upper lobe (HCC) 12/26/2023   Lung nodule 12/12/2023   DNR (do not resuscitate) 11/21/2020   Thrombocytopenia 09/02/2020   Recurrent falls 09/07/2019   Dyslipidemia 09/06/2019   Tremor 09/06/2019   Psychosocial stressors 04/03/2018   Congenital absence of one kidney 09/05/2017   Anemia in chronic kidney disease 09/24/2016   CKD stage 3b, GFR 30-44 ml/min (HCC) 09/24/2016   Peripheral vascular disease    Failure to thrive in adult 10/18/2015   Venous stasis dermatitis of both lower extremities 01/18/2015   Radiculopathy of lumbar region 01/06/2015   Seborrheic keratosis 11/24/2014   Nausea & vomiting    OSA (obstructive sleep apnea) 09/21/2014   Osteoporosis 06/29/2014   Thyroid  nodule 06/29/2014   Breast calcifications 06/29/2014   Primary open angle glaucoma (POAG) of both eyes, severe stage 01/08/2014   Basal cell carcinoma of scalp 11/12/2013   Presbyopia  10/22/2013   GERD (gastroesophageal reflux disease) 09/11/2013   Major depressive disorder, recurrent episode, severe (HCC) 05/14/2013   Pulmonary nodules 05/24/2012   Carotid bruit 04/30/2012   Essential hypertension 04/27/2012   Anxiety 04/27/2012   Cervical spondylosis with radiculopathy 04/19/2012    Orientation RESPIRATION BLADDER Height & Weight     Self, Time, Situation, Place  O2 Continent Weight: 64 kg Height:  4' 11 (149.9 cm)  BEHAVIORAL SYMPTOMS/MOOD NEUROLOGICAL BOWEL NUTRITION STATUS      Continent Diet (Soft)  AMBULATORY STATUS COMMUNICATION OF NEEDS Skin   Limited Assist Verbally Bruising (gen'l bruising arms)                       Personal Care Assistance Level of Assistance  Bathing, Feeding, Dressing Bathing Assistance: Limited assistance   Dressing Assistance: Limited assistance     Functional Limitations Info  Sight, Hearing, Speech Sight Info: Impaired (eyeglasses) Hearing Info: Adequate Speech Info: Impaired (dentures-top/bottom)    SPECIAL CARE FACTORS FREQUENCY  PT (By licensed PT), OT (By licensed OT)     PT Frequency: 5x week OT Frequency: 5x week            Contractures Contractures Info: Not present    Additional Factors Info  Code Status, Allergies Code Status Info: DNR Allergies Info: Tape, Cyclobenzaprine , Latex, Statins           Current Medications (06/10/2024):  This is the current hospital active medication list Current Facility-Administered Medications  Medication Dose Route Frequency Provider Last Rate Last Admin   acetaminophen  (TYLENOL )  tablet 650 mg  650 mg Oral Q6H PRN Gherghe, Costin M, MD   650 mg at 06/10/24 0550   Or   acetaminophen  (TYLENOL ) suppository 650 mg  650 mg Rectal Q6H PRN Gherghe, Costin M, MD       artificial tears ophthalmic solution 1-2 drop  1-2 drop Both Eyes Q6H PRN Gherghe, Costin M, MD       busPIRone  (BUSPAR ) tablet 10 mg  10 mg Oral TID Gherghe, Costin M, MD   10 mg at 06/10/24 9163    citalopram  (CELEXA ) tablet 20 mg  20 mg Oral QHS Gherghe, Costin M, MD   20 mg at 06/09/24 2216   clopidogrel  (PLAVIX ) tablet 75 mg  75 mg Oral Daily Gherghe, Costin M, MD   75 mg at 06/10/24 9163   divalproex  (DEPAKOTE ) DR tablet 250 mg  250 mg Oral BID Gherghe, Costin M, MD   250 mg at 06/10/24 9163   enoxaparin  (LOVENOX ) injection 40 mg  40 mg Subcutaneous Q24H Legge, Justin M, RPH   40 mg at 06/09/24 2219   gabapentin  (NEURONTIN ) capsule 200 mg  200 mg Oral TID Jillian Buttery, MD   200 mg at 06/10/24 9164   guaiFENesin -dextromethorphan  (ROBITUSSIN DM) 100-10 MG/5ML syrup 10 mL  10 mL Oral Q6H PRN Jillian Buttery, MD   10 mL at 06/07/24 2054   ipratropium-albuterol  (DUONEB) 0.5-2.5 (3) MG/3ML nebulizer solution 3 mL  3 mL Nebulization TID Jillian Buttery, MD   3 mL at 06/10/24 0808   lidocaine  (LIDODERM ) 5 % 1 patch  1 patch Transdermal Q24H Jillian Buttery, MD   1 patch at 06/10/24 9162   LORazepam  (ATIVAN ) tablet 0.5 mg  0.5 mg Oral Q6H PRN Gherghe, Costin M, MD   0.5 mg at 06/09/24 1404   melatonin tablet 5 mg  5 mg Oral QHS PRN Chavez, Abigail, NP   5 mg at 06/09/24 2216   methocarbamol  (ROBAXIN ) tablet 500 mg  500 mg Oral Q6H PRN Adhikari, Amrit, MD   500 mg at 06/09/24 1404   morphine  (PF) 2 MG/ML injection 2 mg  2 mg Intravenous Q4H PRN Adhikari, Amrit, MD   2 mg at 06/07/24 9066   Nasal Moist GEL 1 spray  1 spray Each Nare BID PRN Gherghe, Costin M, MD       ondansetron  (ZOFRAN ) tablet 4 mg  4 mg Oral Q6H PRN Gherghe, Costin M, MD       Or   ondansetron  (ZOFRAN ) injection 4 mg  4 mg Intravenous Q6H PRN Gherghe, Costin M, MD   4 mg at 06/04/24 1857   oxyCODONE  (Oxy IR/ROXICODONE ) immediate release tablet 5 mg  5 mg Oral Q6H PRN Jillian Buttery, MD   5 mg at 06/09/24 2217   pantoprazole  (PROTONIX ) EC tablet 40 mg  40 mg Oral BID Gherghe, Costin M, MD   40 mg at 06/10/24 9163   sodium chloride  (OCEAN) 0.65 % nasal spray 1 spray  1 spray Each Nare BID PRN Gherghe, Costin M, MD        umeclidinium-vilanterol (ANORO ELLIPTA ) 62.5-25 MCG/ACT 1 puff  1 puff Inhalation Daily Gherghe, Costin M, MD   1 puff at 06/10/24 0808     Discharge Medications: Please see discharge summary for a list of discharge medications.  Relevant Imaging Results:  Relevant Lab Results:   Additional Information SS#[id number] [phone]  [name], RN

## 2024-06-10 NOTE — Plan of Care (Signed)

## 2024-06-10 NOTE — TOC Progression Note (Addendum)
 Transition of Care [facility]) - Progression Note    Patient Details  Name: [name] [name] MRN: [id number] Date of Birth: 1941-06-12  Transition of Care [facility]) CM/SW Contact  [name], RN Phone Number:[phone]  06/10/2024, 10:18 AM  Clinical Narrative:    Corrected FL2 and 30 day noted have been cosigned by MD and uploaded for PASRR review.   1518 CM at bedside to offer choice from bed offers. Patient wants [facility]. CM has confirmed with Levon at [facility] that bed is available. Insurance auth initiated with Tammy at [facility].  Barriers to Discharge: Continued Medical Work up               Expected Discharge Plan and Services   Discharge Planning Services: CM Consult Post Acute Care Choice: Skilled Nursing Facility Living arrangements for the past 2 months: Single Family Home                                       Social Drivers of Health (SDOH) Interventions SDOH Screenings   Food Insecurity: Food Insecurity Present (06/04/2024)  Housing: High Risk (06/04/2024)  Transportation Needs: No Transportation Needs (06/04/2024)  Utilities: Not At Risk (06/04/2024)  Alcohol  Screen: Low Risk  (10/13/2022)  Depression (PHQ2-9): Medium Risk (05/20/2024)  Financial Resource Strain: Low Risk  (10/13/2022)  Physical Activity: Insufficiently Active (10/13/2022)  Social Connections: Moderately Integrated (06/04/2024)  Stress: Stress Concern Present (10/13/2022)  Tobacco Use: Medium Risk (06/04/2024)    Readmission Risk Interventions    06/08/2023    1:37 PM 07/26/2022   12:20 PM 05/09/2022    3:18 PM  Readmission Risk Prevention Plan  Post Dischage Appt  Complete Complete  Medication Screening  Complete Complete  Transportation Screening Complete Complete   PCP or Specialist Appt within 5-7 Days Complete    Home Care Screening Complete    Medication Review (RN CM) Complete

## 2024-06-10 NOTE — TOC CM/SW Note (Signed)
 30 Day PASRR Note     Patient Details  Name: [name] [name] Date of Birth: 1940/10/28      To Whom It May Concern:   Please be advised that this patient will require a short-term nursing home stay - anticipated 30 days or less for rehabilitation and strengthening.   The plan is for return home.

## 2024-06-10 NOTE — [place] FL2 (Signed)
 [place]  MEDICAID FL2 LEVEL OF CARE FORM     IDENTIFICATION  Patient Name: [name] [name] Birthdate: [birth year] Sex: Female Admission Date (Current Location): 06/04/2024  [facility] and [place] Number:  Producer, Television/film/video and Address:  [facility],  [address], [place] [address]      Provider Number: [id number]  Attending Physician Name and Address:  Jillian Buttery, MD  Relative Name and Phone Number:  Sidra Mania [facility]) [phone]    Current Level of Care: Hospital Recommended Level of Care: Skilled Nursing Facility Prior Approval Number:    Date Approved/Denied:   PASRR Number: pending  Discharge Plan: SNF    Current Diagnoses: Patient Active Problem List   Diagnosis Date Noted   AKI (acute kidney injury) 06/04/2024   Hypoglycemia 03/05/2024   Generalized weakness 03/05/2024   Hypomagnesemia 03/05/2024   Leukopenia 03/05/2024   Lung cancer (HCC) 03/05/2024   GAD (generalized anxiety disorder) 03/05/2024   Depression 03/05/2024   Primary malignant neoplasm of bronchus of left upper lobe (HCC) 12/26/2023   Lung nodule 12/12/2023   DNR (do not resuscitate) 11/21/2020   Thrombocytopenia 09/02/2020   Recurrent falls 09/07/2019   Dyslipidemia 09/06/2019   Tremor 09/06/2019   Psychosocial stressors 04/03/2018   Congenital absence of one kidney 09/05/2017   Anemia in chronic kidney disease 09/24/2016   CKD stage 3b, GFR 30-44 ml/min (HCC) 09/24/2016   Peripheral vascular disease    Failure to thrive in adult 10/18/2015   Venous stasis dermatitis of both lower extremities 01/18/2015   Radiculopathy of lumbar region 01/06/2015   Seborrheic keratosis 11/24/2014   Nausea & vomiting    OSA (obstructive sleep apnea) 09/21/2014   Osteoporosis 06/29/2014   Thyroid  nodule 06/29/2014   Breast calcifications 06/29/2014   Primary open angle glaucoma (POAG) of both eyes, severe stage 01/08/2014   Basal cell carcinoma of scalp 11/12/2013   Presbyopia  10/22/2013   GERD (gastroesophageal reflux disease) 09/11/2013   Major depressive disorder, recurrent episode, severe (HCC) 05/14/2013   Pulmonary nodules 05/24/2012   Carotid bruit 04/30/2012   Essential hypertension 04/27/2012   Anxiety 04/27/2012   Cervical spondylosis with radiculopathy 04/19/2012    Orientation RESPIRATION BLADDER Height & Weight     Self, Time, Situation, Place  O2 (2L [place]) Continent Weight: 64 kg Height:  4' 11 (149.9 cm)  BEHAVIORAL SYMPTOMS/MOOD NEUROLOGICAL BOWEL NUTRITION STATUS     (n/a) Continent Diet (Soft)  AMBULATORY STATUS COMMUNICATION OF NEEDS Skin   Limited Assist Verbally Bruising (bruising noted to bilateral arms)                       Personal Care Assistance Level of Assistance  Bathing, Feeding, Dressing Bathing Assistance: Limited assistance Feeding assistance: Limited assistance Dressing Assistance: Limited assistance     Functional Limitations Info  Sight, Hearing, Speech Sight Info: Impaired (eyeglasses) Hearing Info: Impaired Speech Info: Adequate    SPECIAL CARE FACTORS FREQUENCY  PT (By licensed PT), OT (By licensed OT)     PT Frequency: 5x/wk OT Frequency: 5x/wk            Contractures Contractures Info: Not present    Additional Factors Info  Code Status, Allergies Code Status Info: DNR Allergies Info: Tape, Cyclobenzaprine , Latex, Statins           Current Medications (06/10/2024):  This is the current hospital active medication list Current Facility-Administered Medications  Medication Dose Route Frequency Provider Last Rate Last Admin  acetaminophen  (TYLENOL ) tablet 650 mg  650 mg Oral Q6H PRN Gherghe, Costin M, MD   650 mg at 06/10/24 0550   Or   acetaminophen  (TYLENOL ) suppository 650 mg  650 mg Rectal Q6H PRN Gherghe, Costin M, MD       artificial tears ophthalmic solution 1-2 drop  1-2 drop Both Eyes Q6H PRN Gherghe, Costin M, MD       busPIRone  (BUSPAR ) tablet 10 mg  10 mg Oral TID Gherghe,  Costin M, MD   10 mg at 06/10/24 0836   citalopram  (CELEXA ) tablet 20 mg  20 mg Oral QHS Gherghe, Costin M, MD   20 mg at 06/09/24 2216   clopidogrel  (PLAVIX ) tablet 75 mg  75 mg Oral Daily Gherghe, Costin M, MD   75 mg at 06/10/24 9163   divalproex  (DEPAKOTE ) DR tablet 250 mg  250 mg Oral BID Gherghe, Costin M, MD   250 mg at 06/10/24 9163   enoxaparin  (LOVENOX ) injection 40 mg  40 mg Subcutaneous Q24H Legge, Justin M, RPH   40 mg at 06/09/24 2219   gabapentin  (NEURONTIN ) capsule 200 mg  200 mg Oral TID Jillian Buttery, MD   200 mg at 06/10/24 0835   guaiFENesin -dextromethorphan  (ROBITUSSIN DM) 100-10 MG/5ML syrup 10 mL  10 mL Oral Q6H PRN Jillian Buttery, MD   10 mL at 06/07/24 2054   ipratropium-albuterol  (DUONEB) 0.5-2.5 (3) MG/3ML nebulizer solution 3 mL  3 mL Nebulization TID Jillian Buttery, MD   3 mL at 06/10/24 9191   lidocaine  (LIDODERM ) 5 % 1 patch  1 patch Transdermal Q24H Jillian Buttery, MD   1 patch at 06/10/24 9162   LORazepam  (ATIVAN ) tablet 0.5 mg  0.5 mg Oral Q6H PRN Gherghe, Costin M, MD   0.5 mg at 06/09/24 1404   melatonin tablet 5 mg  5 mg Oral QHS PRN Chavez, Abigail, NP   5 mg at 06/09/24 2216   methocarbamol  (ROBAXIN ) tablet 500 mg  500 mg Oral Q6H PRN Jillian Buttery, MD   500 mg at 06/09/24 1404   morphine  (PF) 2 MG/ML injection 2 mg  2 mg Intravenous Q4H PRN Adhikari, Amrit, MD   2 mg at 06/07/24 9066   Nasal Moist GEL 1 spray  1 spray Each Nare BID PRN Gherghe, Costin M, MD       ondansetron  (ZOFRAN ) tablet 4 mg  4 mg Oral Q6H PRN Gherghe, Costin M, MD       Or   ondansetron  (ZOFRAN ) injection 4 mg  4 mg Intravenous Q6H PRN Gherghe, Costin M, MD   4 mg at 06/04/24 1857   oxyCODONE  (Oxy IR/ROXICODONE ) immediate release tablet 5 mg  5 mg Oral Q6H PRN Adhikari, Amrit, MD   5 mg at 06/09/24 2217   pantoprazole  (PROTONIX ) EC tablet 40 mg  40 mg Oral BID Gherghe, Costin M, MD   40 mg at 06/10/24 9163   sodium chloride  (OCEAN) 0.65 % nasal spray 1 spray  1 spray Each Nare  BID PRN Gherghe, Costin M, MD       umeclidinium-vilanterol (ANORO ELLIPTA ) 62.5-25 MCG/ACT 1 puff  1 puff Inhalation Daily Gherghe, Costin M, MD   1 puff at 06/10/24 0808     Discharge Medications: Please see discharge summary for a list of discharge medications.  Relevant Imaging Results:  Relevant Lab Results:   Additional Information SS#[phone]  [name], RN

## 2024-06-11 DIAGNOSIS — N179 Acute kidney failure, unspecified: Secondary | ICD-10-CM | POA: Diagnosis not present

## 2024-06-11 LAB — BASIC METABOLIC PANEL WITH GFR
Anion gap: 8 (ref 5–15)
BUN: 31 mg/dL — ABNORMAL HIGH (ref 8–23)
CO2: 33 mmol/L — ABNORMAL HIGH (ref 22–32)
Calcium: 8.8 mg/dL — ABNORMAL LOW (ref 8.9–10.3)
Chloride: 97 mmol/L — ABNORMAL LOW (ref 98–111)
Creatinine, Ser: 0.91 mg/dL (ref 0.44–1.00)
GFR, Estimated: >60 mL/min (ref >60)
Glucose, Bld: 101 mg/dL — ABNORMAL HIGH (ref 70–99)
Potassium: 5.1 mmol/L (ref 3.5–5.1)
Sodium: 137 mmol/L (ref 135–145)

## 2024-06-11 LAB — CBC
HCT: 32.8 % — ABNORMAL LOW (ref 36.0–46.0)
Hemoglobin: 11.2 g/dL — ABNORMAL LOW (ref 12.0–15.0)
MCH: 37.2 pg — ABNORMAL HIGH (ref 26.0–34.0)
MCHC: 34.1 g/dL (ref 30.0–36.0)
MCV: 109 fL — ABNORMAL HIGH (ref 80.0–100.0)
Platelets: 123 K/uL — ABNORMAL LOW (ref 150–400)
RBC: 3.01 MIL/uL — ABNORMAL LOW (ref 3.87–5.11)
RDW: 14.2 % (ref 11.5–15.5)
WBC: 6.2 K/uL (ref 4.0–10.5)
nRBC: 0 % (ref 0.0–0.2)

## 2024-06-11 MED ORDER — GUAIFENESIN-DM 100-10 MG/5ML PO SYRP: 10.0000 mL | ORAL_SOLUTION | Freq: Four times a day (QID) | ORAL | Status: AC | PRN

## 2024-06-11 MED ORDER — LIDOCAINE 5 % EX PTCH: 1.0000 | MEDICATED_PATCH | CUTANEOUS | Status: AC

## 2024-06-11 MED ORDER — IPRATROPIUM-ALBUTEROL 0.5-2.5 (3) MG/3ML IN SOLN: 3.0000 mL | Freq: Three times a day (TID) | RESPIRATORY_TRACT | Status: AC

## 2024-06-11 MED ORDER — LORAZEPAM 0.5 MG PO TABS: 0.5000 mg | ORAL_TABLET | Freq: Four times a day (QID) | ORAL | 0 refills | Status: AC | PRN

## 2024-06-11 MED ORDER — METHOCARBAMOL 500 MG PO TABS: 500.0000 mg | ORAL_TABLET | Freq: Four times a day (QID) | ORAL | Status: AC | PRN

## 2024-06-11 MED ORDER — TRAMADOL HCL 50 MG PO TABS: 50.0000 mg | ORAL_TABLET | Freq: Four times a day (QID) | ORAL | 0 refills | Status: AC | PRN

## 2024-06-11 MED ORDER — BUSPIRONE HCL 10 MG PO TABS: 10.0000 mg | ORAL_TABLET | Freq: Three times a day (TID) | ORAL | Status: AC

## 2024-06-11 MED ORDER — GABAPENTIN 100 MG PO CAPS: 200.0000 mg | ORAL_CAPSULE | Freq: Three times a day (TID) | ORAL | Status: AC

## 2024-06-11 NOTE — TOC Progression Note (Addendum)
 Transition of Care [facility]) - Progression Note    Patient Details  Name: [name] [name] MRN: [id number] Date of Birth: 17-Feb-1941  Transition of Care [facility]) CM/SW Contact  [name], RN Phone [phone]  06/11/2024, 8:51 AM  Clinical Narrative:    PASRR approved # [id number] E. Awaiting insurance auth approval.    Expected Discharge Plan: Skilled Nursing Facility Barriers to Discharge: Continued Medical Work up               Expected Discharge Plan and Services   Discharge Planning Services: CM Consult Post Acute Care Choice: Skilled Nursing Facility Living arrangements for the past 2 months: Single Family Home                                       Social Drivers of Health (SDOH) Interventions SDOH Screenings   Food Insecurity: Food Insecurity Present (06/04/2024)  Housing: High Risk (06/04/2024)  Transportation Needs: No Transportation Needs (06/04/2024)  Utilities: Not At Risk (06/04/2024)  Alcohol  Screen: Low Risk  (10/13/2022)  Depression (PHQ2-9): Medium Risk (05/20/2024)  Financial Resource Strain: Low Risk  (10/13/2022)  Physical Activity: Insufficiently Active (10/13/2022)  Social Connections: Moderately Integrated (06/04/2024)  Stress: Stress Concern Present (10/13/2022)  Tobacco Use: Medium Risk (06/04/2024)    Readmission Risk Interventions    06/08/2023    1:37 PM 07/26/2022   12:20 PM 05/09/2022    3:18 PM  Readmission Risk Prevention Plan  Post Dischage Appt  Complete Complete  Medication Screening  Complete Complete  Transportation Screening Complete Complete   PCP or Specialist Appt within 5-7 Days Complete    Home Care Screening Complete    Medication Review (RN CM) Complete

## 2024-06-11 NOTE — TOC Transition Note (Incomplete)
 Transition of Care [facility]) - Discharge Note   Patient Details  Name: [name] [name] MRN: [id number] Date of Birth: [birth year]  Transition of Care [facility]) CM/SW Contact:  [name], RN Phone Number:[phone]  06/11/2024, 3:31 PM   Clinical Narrative:    Patient discharging to [facility] place. Transportation arranged per [employer]. D/c packet at nurses station. Information for report has been given to RN. No other inpatient care manager needs.   Final next level of care: Skilled Nursing Facility Barriers to Discharge: Continued Medical Work up   Patient Goals and CMS Choice Patient states their goals for this hospitalization and ongoing recovery are:: Rehab CMS Medicare.gov Compare Post Acute Care list provided to:: Patient Choice offered to / list presented to : Patient [employer] ownership interest in [facility] .provided to:: Patient    Discharge Placement                       Discharge Plan and Services Additional resources added to the After Visit Summary for     Discharge Planning Services: CM Consult Post Acute Care Choice: Skilled Nursing Facility                               Social Drivers of Health (SDOH) Interventions SDOH Screenings   Food Insecurity: Food Insecurity Present (06/04/2024)  Housing: High Risk (06/04/2024)  Transportation Needs: No Transportation Needs (06/04/2024)  Utilities: Not At Risk (06/04/2024)  Alcohol  Screen: Low Risk  (10/13/2022)  Depression (PHQ2-9): Medium Risk (05/20/2024)  Financial Resource Strain: Low Risk  (10/13/2022)  Physical Activity: Insufficiently Active (10/13/2022)  Social Connections: Moderately Integrated (06/04/2024)  Stress: Stress Concern Present (10/13/2022)  Tobacco Use: Medium Risk (06/04/2024)     Readmission Risk Interventions    06/08/2023    1:37 PM 07/26/2022   12:20 PM 05/09/2022    3:18 PM  Readmission Risk Prevention Plan  Post Dischage Appt  Complete Complete  Medication Screening   Complete Complete  Transportation Screening Complete Complete   PCP or Specialist Appt within 5-7 Days Complete    Home Care Screening Complete    Medication Review (RN CM) Complete

## 2024-06-11 NOTE — Discharge Summary (Addendum)
 Physician Discharge Summary  [name] [name] FMW:[id number] DOB: 11/27/1940 DOA: 06/04/2024  PCP: [name], DO  Admit date: 06/04/2024 Discharge date: 06/12/2024  Admitted From: Home Disposition:  SNF  Discharge Condition:Stable CODE STATUS:DNR Diet recommendation:Regular / soft  Brief/Interim Summary: Patient is a 83 year old female with history of CKD stage IIIa, congenital absence of 1 kidney, hypertension, hyperlipidemia, recently diagnosed stage I lung adenocarcinoma status post SBRT, COPD on 2 L of oxygen at home intermittently who presented with complaint of nausea, vomiting, diarrhea, poor oral intake for past week, progressive weakness, difficulty with ambulation, crampy abdominal discomfort, unable to tolerate food, dysuria.  On presentation, she was hemodynamically stable.  Lab work showed creatinine of 1.7.  UA suggestive  of UTI.  Started on antibiotic, IV fluid, urine culture showed Proteus.  She finished the course of antibiotic.  PT recommending SNF on discharge,TOC following.  Medically  stable for discharge   Following problems were addressed during the hospitalization:   AKI on CKD stage IIIa: Baseline creatinine around 1.3.  Presented with creatinine of 1.7.  Likely from poor oral intake, also uses Lasix  at home.    Monitor renal function.  Avoid nephrotoxins.  Kidney function back to baseline.  IV fluid stopped   Abdomen pain, nausea, vomiting, diarrhea: Unclear etiology.  Could be associated  with UTI.  CT abdomen/pelvis did not show any acute findings.  Given antiemetics, IV fluids.  Abdomen benign on  examination .  Denies any abdomen pain, nausea or vomiting today.   UTI: Endorsed dysuria on admission.  Urine culture showed Proteus, pansensitive.  Antibiotic changed to oral.  Finished 5 days course including IV antibiotics   Hypertension: Home medications can be resumed on dc.  Takes Norvasc , furosemide  at home.   COPD/chronic hypoxic respiratory failure: Currently  not in exacerbation.  On 2 L of oxygen at home intermittently.  Continue bronchodilators as needed.  Currently requiring 2 L.  Chest x-ray did not show any acute findings.     Finished  5 days course of prednisone .   Stage I lung adenocarcinoma: Status post SBRT.  Recently diagnosed.  Follows with Dr. Sherrod   Pancytopenia: Recent B12 level optimal.Stable   History of peripheral vascular disease: Status post right external iliac/left common iliac stenting.  Follows with vascular surgery.  Continue home medication   Right hip pain/right ankle pain: Report of falling about 2 months ago.  X-ray of the right hip/right ankle did not show any acute but showed moderate to advanced  degenerative arthropathy of both hips, worse on the right.  Continued to complain of right hip pain.  Ordered lidocaine  patch, Robaxin .  Doubled the dose of gabapentin  to 200 mg 3 times a day.  MRI of the right  hip showed advanced osteoarthritis of right hip, complete full-thickness tear of the right gluteus minimus cuff insertion with surrounding edema .  Orthopedics, Dr. Reyne consulted.  Patient is interested to undergo surgery.  As per Dr. Lucius, Dr. Edna  will follow her as an outpatient.  Appointment will be scheduled  Weakness/deconditioning/failure to thrive/fall: Lives alone in a trailer house.  Does not have heat or  AC.  TOC consulted.  PT/OT evaluation done,plan for SNF  Discharge Diagnoses:  Active Problems:   AKI (acute kidney injury)    Discharge Instructions  Discharge Instructions     Diet general   Complete by: As directed    Soft diet   Discharge instructions   Complete by: As directed  1)Please take your medications as instructed 2) Please follow-up with orthopedics as an outpatient in 1-2 weeks.  Name and number of the provider has been attached.   Increase activity slowly   Complete by: As directed       Allergies as of 06/12/2024       Reactions   Tape Other (See Comments)    SKIN TEARS EASILY!!   Cyclobenzaprine  Itching   Latex Rash   Statins Other (See Comments), Rash, Swelling   Swelling involving tongue; also causes muscle pain        Medication List     STOP taking these medications    nystatin  powder Commonly known as: MYCOSTATIN /NYSTOP        TAKE these medications    acetaminophen  500 MG tablet Commonly known as: TYLENOL  Take 500-1,000 mg by mouth See admin instructions. Take 1,000 mg by mouth in the morning & at bedtime and 500 mg with lunch   albuterol  108 (90 Base) MCG/ACT inhaler Commonly known as: VENTOLIN  HFA Inhale 1 puff into the lungs every 6 (six) hours as needed for shortness of breath or wheezing.   amLODipine  2.5 MG tablet Commonly known as: NORVASC  TAKE 1 TABLET BY MOUTH DAILY   busPIRone  10 MG tablet Commonly known as: BUSPAR  Take 1 tablet (10 mg total) by mouth 3 (three) times daily. What changed: when to take this   citalopram  20 MG tablet Commonly known as: CELEXA  Take 1 tablet (20 mg total) by mouth daily. What changed: when to take this   clopidogrel  75 MG tablet Commonly known as: PLAVIX  TAKE 1 TABLET BY MOUTH DAILY What changed: when to take this   divalproex  125 MG DR tablet Commonly known as: DEPAKOTE  Take 2 tablets (250 mg total) by mouth 2 (two) times daily.   Dry Eye Relief Drops 0.2-0.2-1 % Soln Generic drug: Glycerin-Hypromellose-PEG 400 Place 1-2 drops into both eyes See admin instructions. Instill 1-2 drops into both eyes in the morning, at noon, mid-afternoon, and bedtime   furosemide  20 MG tablet Commonly known as: LASIX  Take 20 mg by mouth See admin instructions. Take 20 mg by mouth every 4 days   gabapentin  100 MG capsule Commonly known as: NEURONTIN  Take 2 capsules (200 mg total) by mouth 3 (three) times daily. What changed: how much to take   Gerhardt's butt cream Crea Apply 1 Application topically 2 (two) times daily.   guaiFENesin -dextromethorphan  100-10 MG/5ML  syrup Commonly known as: ROBITUSSIN DM Take 10 mLs by mouth every 6 (six) hours as needed for cough.   ipratropium-albuterol  0.5-2.5 (3) MG/3ML Soln Commonly known as: DUONEB Take 3 mLs by nebulization 3 (three) times daily.   lidocaine  5 % Commonly known as: LIDODERM  Place 1 patch onto the skin daily. Remove & Discard patch within 12 hours or as directed by MD   LORazepam  0.5 MG tablet Commonly known as: ATIVAN  Take 1 tablet (0.5 mg total) by mouth every 6 (six) hours as needed for anxiety. What changed:  when to take this reasons to take this additional instructions   methocarbamol  500 MG tablet Commonly known as: ROBAXIN  Take 1 tablet (500 mg total) by mouth every 6 (six) hours as needed for muscle spasms.   Nasal Moist Gel Apply after using nasal saline spray What changed:  how much to take how to take this when to take this additional instructions   nystatin -triamcinolone  ointment Commonly known as: MYCOLOG Apply 1 Application topically 2 (two) times daily. Under the breasts What changed:  when to take this reasons to take this additional instructions   pantoprazole  40 MG tablet Commonly known as: PROTONIX  TAKE 1 TABLET BY MOUTH 2 TIMES A DAY What changed: when to take this   sodium chloride  0.65 % Soln nasal spray Commonly known as: OCEAN Use twice daily to keep nose moist. What changed:  how much to take how to take this when to take this reasons to take this additional instructions   traMADol  50 MG tablet Commonly known as: ULTRAM  Take 1 tablet (50 mg total) by mouth every 6 (six) hours as needed. What changed:  when to take this reasons to take this additional instructions   umeclidinium-vilanterol 62.5-25 MCG/ACT Aepb Commonly known as: Anoro Ellipta  Inhale 1 puff into the lungs daily.         Follow-up Information     Edna Toribio LABOR, MD. Schedule an appointment as soon as possible for a visit in 2 week(s).   Specialty:  Orthopedic Surgery Why: You will be called for appointment.  If you do not receive a call, call for appointment on the given number Contact information: [address] [place] [address] [phone]                Allergies  Allergen Reactions   Tape Other (See Comments)    SKIN TEARS EASILY!!   Cyclobenzaprine  Itching   Latex Rash   Statins Other (See Comments), Rash and Swelling    Swelling involving tongue; also causes muscle pain    Consultations: Orthopedics   Procedures/Studies: MR HIP RIGHT W WO CONTRAST Result Date: 06/10/2024 CLINICAL DATA:  Recent falls.  Right hip pain. EXAM: MRI OF THE RIGHT HIP WITHOUT AND WITH CONTRAST TECHNIQUE: Multiplanar, multisequence MR imaging was performed both before and after administration of intravenous contrast. CONTRAST:  6mL GADAVIST GADOBUTROL 1 MMOL/ML IV SOLN COMPARISON:  Right hip radiographs dated 06/07/2024. CT abdomen/pelvis dated 06/04/2024. FINDINGS: Soft tissue and Muscle: Mild asymmetric fatty atrophy of the right gluteal musculature relative to the left. Essentially complete full-thickness tear of the right gluteus minimus cuff insertion with surrounding edema. The right gluteus medius cuff insertion is intact. Hamstring tendon origins are intact. Bones: No acute fracture or dislocation. No avascular necrosis. No aggressive osseous lesion. Sacroiliac joints and pubic symphysis are anatomically aligned with mild degenerative changes. Degenerative changes of the visualized lower lumbar spine. Hip: The hip joint is anatomically aligned. Advanced joint space narrowing of the superior weight-bearing aspect of the right hip resulting in bone-on-bone contact with subchondral edema of the superior acetabulum and femoral head. Diffuse high-grade thinning of the superior weight-bearing articular cartilage. Diffuse labral degeneration. No joint effusion. Other: No enlarged lymph nodes identified in the field of view.  Visualized intra-abdominal contents demonstrate no acute abnormality. Sigmoid colonic diverticulosis. IMPRESSION: 1. No acute osseous abnormality. 2. Advanced osteoarthritis of the right hip resulting in bone-on-bone contact with subchondral edema. 3. Diffuse labral degeneration. 4. Essentially complete full-thickness tear of the right gluteus minimus cuff insertion with surrounding edema. Electronically Signed   By: Harrietta Sherry M.D.   On: 06/10/2024 09:25   DG HIP UNILAT WITH PELVIS 2-3 VIEWS RIGHT Result Date: 06/07/2024 EXAM: 2 or more VIEW(S) XRAY OF THE BILATERAL HIP 06/07/2024 10:16:00 AM COMPARISON: 03/04/2024 CLINICAL HISTORY: Right hip pain FINDINGS: BONES AND JOINTS: No acute fracture or focal osseous lesion. Moderate to advanced degenerative arthropathy of both hips. Degeneration worse on the right. SOFT TISSUES: Iliofemoral atherosclerosis and bilateral iliac stents noted. LUMBAR  SPINE: Lower lumbar degenerative disc disease. IMPRESSION: 1. No acute abnormality. 2. Moderate to advanced degenerative arthropathy of both hips, worse on the right. Electronically signed by: Norman Gatlin MD 06/07/2024 06:49 PM EST RP Workstation: [id number]   DG Ankle 2 Views Right Result Date: 06/07/2024 EXAM: 2 VIEW(S) XRAY OF THE RIGHT ANKLE 06/07/2024 10:16:00 AM CLINICAL HISTORY: Right ankle pain COMPARISON: 05/09/2022 FINDINGS: BONES AND JOINTS: Demineralization. Remote posttraumatic changes about the distal fibula and tibia. Mild osteoarthritis of the midfoot and hindfoot. No acute fracture. No focal osseous lesion. No joint dislocation. SOFT TISSUES: The soft tissues are unremarkable. IMPRESSION: 1. No acute abnormality. Electronically signed by: Norman Gatlin MD 06/07/2024 06:48 PM EST RP Workstation: [id number]   CT ABDOMEN PELVIS WO CONTRAST Result Date: 06/04/2024 CLINICAL DATA:  Abdominal pain. EXAM: CT ABDOMEN AND PELVIS WITHOUT CONTRAST TECHNIQUE: Multidetector CT imaging of the abdomen and  pelvis was performed following the standard protocol without IV contrast. RADIATION DOSE REDUCTION: This exam was performed according to the departmental dose-optimization program which includes automated exposure control, adjustment of the mA and/or kV according to patient size and/or use of iterative reconstruction technique. COMPARISON:  CT dated 03/04/2024. FINDINGS: Evaluation of this exam is limited in the absence of intravenous contrast. Lower chest: No acute abnormality. No intra-abdominal free air or free fluid Hepatobiliary: The liver is unremarkable. No biliary dilatation. The gallbladder is unremarkable. Pancreas: Unremarkable. No pancreatic ductal dilatation or surrounding inflammatory changes. Spleen: Choose a Adrenals/Urinary Tract: Adrenal glands are unremarkable. The left kidney is atrophic. There is no hydronephrosis or excised on the right. The visualized ureters and urinary bladder unremarkable breath Stomach/Bowel: There is sigmoid diverticulosis. There is no bowel obstruction or active inflammation. The appendix is not visualized with certainty. No inflammatory changes identified in the right lower quadrant. Vascular/Lymphatic: Advanced aortoiliac atherosclerotic disease with the IVC is unremarkable no portal venous gas. There is no adenopathy. Reproductive: No suspicious adnexal masses. Other: None Musculoskeletal: Osteopenia with degenerative changes of the spine. No acute osseous pathology. IMPRESSION: 1. No acute intra-abdominal or pelvic pathology. 2. Sigmoid diverticulosis. No bowel obstruction. 3. Atrophic left kidney. 4.  Aortic Atherosclerosis (ICD10-I70.0). Electronically Signed   By: Vanetta Chou M.D.   On: 06/04/2024 18:58   DG Chest Port 1 View Result Date: 06/04/2024 CLINICAL DATA:  Shortness of breath, vomiting, diarrhea EXAM: PORTABLE CHEST 1 VIEW COMPARISON:  12/18/2023 FINDINGS: Single frontal view of the chest demonstrates an unremarkable cardiac silhouette. No acute  airspace disease, effusion, or pneumothorax. No acute bony abnormalities. IMPRESSION: 1. No acute intrathoracic process. Electronically Signed   By: Ozell Daring M.D.   On: 06/04/2024 16:00      Subjective: Patient seen and examined at the bedside today.  Comfortable.  Lying in bed.  Denies any shortness of breath or cough.  Was talking on the phone.  Pain on the right hip well-controlled  Discharge Exam: Vitals:   06/12/24 0804 06/12/24 0806  BP:    Pulse:    Resp:    Temp:    SpO2: 98% 98%   Vitals:   06/11/24 2039 06/12/24 0506 06/12/24 0804 06/12/24 0806  BP: (!) 106/54 (!) 140/53    Pulse: 85 69    Resp: 18 18    Temp: 97.9 F (36.6 C) 98.1 F (36.7 C)    TempSrc:  Oral    SpO2: 97% 99% 98% 98%  Weight:      Height:        General: Pt is alert, awake,  not in acute distress, pleasant elderly female Cardiovascular: RRR, S1/S2 +, no rubs, no gallops Respiratory: CTA bilaterally, no wheezing, no rhonchi.  Mildly diminished sounds in bilateral bases Abdominal: Soft, NT, ND, bowel sounds + Extremities: no edema, no cyanosis    The results of significant diagnostics from this hospitalization (including imaging, microbiology, ancillary and laboratory) are listed below for reference.     Microbiology: Recent Results (from the past 240 hours)  Urine Culture (for pregnant, neutropenic or urologic patients or patients with an indwelling urinary catheter)     Status: Abnormal   Collection Time: 06/04/24 12:36 AM   Specimen: Urine, Clean Catch  Result Value Ref Range Status   Specimen Description   Final    URINE, CLEAN CATCH Performed at [facility], [address] [place], [place] [address]    Special Requests   Final    NONE Performed at [facility], [address], [place], [place] [address]    Culture   Final    Two isolates with different morphologies were identified as the same organism.The most resistant organism was  reported. >=100,000 COLONIES/mL PROTEUS MIRABILIS    Report Status 06/08/2024 FINAL  Final   Organism ID, Bacteria PROTEUS MIRABILIS (A)  Final      Susceptibility   Proteus mirabilis - MIC*    AMPICILLIN <=2 SENSITIVE Sensitive     CEFAZOLIN  (URINE) Value in next row Sensitive      4 SENSITIVEThis is a modified FDA-approved test that has been validated and its performance characteristics determined by the reporting laboratory.  This laboratory is certified under the Clinical Laboratory Improvement Amendments CLIA as qualified to perform high complexity clinical laboratory testing.    CEFEPIME  Value in next row Sensitive      4 SENSITIVEThis is a modified FDA-approved test that has been validated and its performance characteristics determined by the reporting laboratory.  This laboratory is certified under the Clinical Laboratory Improvement Amendments CLIA as qualified to perform high complexity clinical laboratory testing.    ERTAPENEM Value in next row Sensitive      4 SENSITIVEThis is a modified FDA-approved test that has been validated and its performance characteristics determined by the reporting laboratory.  This laboratory is certified under the Clinical Laboratory Improvement Amendments CLIA as qualified to perform high complexity clinical laboratory testing.    CEFTRIAXONE  Value in next row Sensitive      4 SENSITIVEThis is a modified FDA-approved test that has been validated and its performance characteristics determined by the reporting laboratory.  This laboratory is certified under the Clinical Laboratory Improvement Amendments CLIA as qualified to perform high complexity clinical laboratory testing.    CIPROFLOXACIN Value in next row Sensitive      4 SENSITIVEThis is a modified FDA-approved test that has been validated and its performance characteristics determined by the reporting laboratory.  This laboratory is certified under the Clinical Laboratory Improvement Amendments CLIA as  qualified to perform high complexity clinical laboratory testing.    GENTAMICIN Value in next row Sensitive      4 SENSITIVEThis is a modified FDA-approved test that has been validated and its performance characteristics determined by the reporting laboratory.  This laboratory is certified under the Clinical Laboratory Improvement Amendments CLIA as qualified to perform high complexity clinical laboratory testing.    NITROFURANTOIN Value in next row Resistant      4 SENSITIVEThis is a modified FDA-approved test that has been validated and its performance characteristics determined by  the reporting laboratory.  This laboratory is certified under the Clinical Laboratory Improvement Amendments CLIA as qualified to perform high complexity clinical laboratory testing.    TRIMETH/SULFA Value in next row Sensitive      4 SENSITIVEThis is a modified FDA-approved test that has been validated and its performance characteristics determined by the reporting laboratory.  This laboratory is certified under the Clinical Laboratory Improvement Amendments CLIA as qualified to perform high complexity clinical laboratory testing.    AMPICILLIN/SULBACTAM Value in next row Sensitive      4 SENSITIVEThis is a modified FDA-approved test that has been validated and its performance characteristics determined by the reporting laboratory.  This laboratory is certified under the Clinical Laboratory Improvement Amendments CLIA as qualified to perform high complexity clinical laboratory testing.    PIP/TAZO Value in next row Sensitive      <=4 SENSITIVEThis is a modified FDA-approved test that has been validated and its performance characteristics determined by the reporting laboratory.  This laboratory is certified under the Clinical Laboratory Improvement Amendments CLIA as qualified to perform high complexity clinical laboratory testing.    MEROPENEM Value in next row Sensitive      <=4 SENSITIVEThis is a modified FDA-approved  test that has been validated and its performance characteristics determined by the reporting laboratory.  This laboratory is certified under the Clinical Laboratory Improvement Amendments CLIA as qualified to perform high complexity clinical laboratory testing.    * >=100,000 COLONIES/mL PROTEUS MIRABILIS     Labs: BNP (last 3 results) No results for input(s): BNP in the last 8760 hours. Basic Metabolic Panel: Recent Labs  Lab 06/06/24 0512 06/08/24 0607 06/11/24 0529  NA 140 139 137  K 4.6 5.1 5.1  CL 111 107 97*  CO2 21* 24 33*  GLUCOSE 91 112* 101*  BUN 18 16 31*  CREATININE 1.29* 0.81 0.91  CALCIUM  8.3* 9.5 8.8*   Liver Function Tests: No results for input(s): AST, ALT, ALKPHOS, BILITOT, PROT, ALBUMIN in the last 168 hours.  No results for input(s): LIPASE, AMYLASE in the last 168 hours.  No results for input(s): AMMONIA in the last 168 hours. CBC: Recent Labs  Lab 06/08/24 0607 06/11/24 0529  WBC 4.1 6.2  HGB 11.2* 11.2*  HCT 35.0* 32.8*  MCV 115.1* 109.0*  PLT 113* 123*   Cardiac Enzymes: No results for input(s): CKTOTAL, CKMB, CKMBINDEX, TROPONINI in the last 168 hours. BNP: Invalid input(s): POCBNP CBG: No results for input(s): GLUCAP in the last 168 hours. D-Dimer No results for input(s): DDIMER in the last 72 hours. Hgb A1c No results for input(s): HGBA1C in the last 72 hours. Lipid Profile No results for input(s): CHOL, HDL, LDLCALC, TRIG, CHOLHDL, LDLDIRECT in the last 72 hours. Thyroid  function studies No results for input(s): TSH, T4TOTAL, T3FREE, THYROIDAB in the last 72 hours.  Invalid input(s): FREET3 Anemia work up No results for input(s): VITAMINB12, FOLATE, FERRITIN, TIBC, IRON , RETICCTPCT in the last 72 hours. Urinalysis    Component Value Date/Time   COLORURINE STRAW (A) 06/04/2024 1247   APPEARANCEUR HAZY (A) 06/04/2024 1247   LABSPEC 1.010 06/04/2024 1247    PHURINE 6.0 06/04/2024 1247   GLUCOSEU NEGATIVE 06/04/2024 1247   HGBUR NEGATIVE 06/04/2024 1247   BILIRUBINUR NEGATIVE 06/04/2024 1247   BILIRUBINUR negative 02/18/2020 0906   BILIRUBINUR NEG 06/08/2015 1447   KETONESUR 5 (A) 06/04/2024 1247   PROTEINUR NEGATIVE 06/04/2024 1247   UROBILINOGEN 0.2 02/18/2020 0906   UROBILINOGEN 0.2 10/19/2014 1619   NITRITE NEGATIVE  06/04/2024 1247   LEUKOCYTESUR LARGE (A) 06/04/2024 1247   Sepsis Labs Recent Labs  Lab 06/08/24 0607 06/11/24 0529  WBC 4.1 6.2   Microbiology Recent Results (from the past 240 hours)  Urine Culture (for pregnant, neutropenic or urologic patients or patients with an indwelling urinary catheter)     Status: Abnormal   Collection Time: 06/04/24 12:36 AM   Specimen: Urine, Clean Catch  Result Value Ref Range Status   Specimen Description   Final    URINE, CLEAN CATCH Performed at [facility], [address], [place], [place] [address]    Special Requests   Final    NONE Performed at [facility], [address], [place], [place] [address]    Culture   Final    Two isolates with different morphologies were identified as the same organism.The most resistant organism was reported. >=100,000 COLONIES/mL PROTEUS MIRABILIS    Report Status 06/08/2024 FINAL  Final   Organism ID, Bacteria PROTEUS MIRABILIS (A)  Final      Susceptibility   Proteus mirabilis - MIC*    AMPICILLIN <=2 SENSITIVE Sensitive     CEFAZOLIN  (URINE) Value in next row Sensitive      4 SENSITIVEThis is a modified FDA-approved test that has been validated and its performance characteristics determined by the reporting laboratory.  This laboratory is certified under the Clinical Laboratory Improvement Amendments CLIA as qualified to perform high complexity clinical laboratory testing.    CEFEPIME  Value in next row Sensitive      4 SENSITIVEThis is a modified FDA-approved test that has been validated and its  performance characteristics determined by the reporting laboratory.  This laboratory is certified under the Clinical Laboratory Improvement Amendments CLIA as qualified to perform high complexity clinical laboratory testing.    ERTAPENEM Value in next row Sensitive      4 SENSITIVEThis is a modified FDA-approved test that has been validated and its performance characteristics determined by the reporting laboratory.  This laboratory is certified under the Clinical Laboratory Improvement Amendments CLIA as qualified to perform high complexity clinical laboratory testing.    CEFTRIAXONE  Value in next row Sensitive      4 SENSITIVEThis is a modified FDA-approved test that has been validated and its performance characteristics determined by the reporting laboratory.  This laboratory is certified under the Clinical Laboratory Improvement Amendments CLIA as qualified to perform high complexity clinical laboratory testing.    CIPROFLOXACIN Value in next row Sensitive      4 SENSITIVEThis is a modified FDA-approved test that has been validated and its performance characteristics determined by the reporting laboratory.  This laboratory is certified under the Clinical Laboratory Improvement Amendments CLIA as qualified to perform high complexity clinical laboratory testing.    GENTAMICIN Value in next row Sensitive      4 SENSITIVEThis is a modified FDA-approved test that has been validated and its performance characteristics determined by the reporting laboratory.  This laboratory is certified under the Clinical Laboratory Improvement Amendments CLIA as qualified to perform high complexity clinical laboratory testing.    NITROFURANTOIN Value in next row Resistant      4 SENSITIVEThis is a modified FDA-approved test that has been validated and its performance characteristics determined by the reporting laboratory.  This laboratory is certified under the Clinical Laboratory Improvement Amendments CLIA as qualified to  perform high complexity clinical laboratory testing.    TRIMETH/SULFA Value in next row Sensitive      4 SENSITIVEThis  is a modified FDA-approved test that has been validated and its performance characteristics determined by the reporting laboratory.  This laboratory is certified under the Clinical Laboratory Improvement Amendments CLIA as qualified to perform high complexity clinical laboratory testing.    AMPICILLIN/SULBACTAM Value in next row Sensitive      4 SENSITIVEThis is a modified FDA-approved test that has been validated and its performance characteristics determined by the reporting laboratory.  This laboratory is certified under the Clinical Laboratory Improvement Amendments CLIA as qualified to perform high complexity clinical laboratory testing.    PIP/TAZO Value in next row Sensitive      <=4 SENSITIVEThis is a modified FDA-approved test that has been validated and its performance characteristics determined by the reporting laboratory.  This laboratory is certified under the Clinical Laboratory Improvement Amendments CLIA as qualified to perform high complexity clinical laboratory testing.    MEROPENEM Value in next row Sensitive      <=4 SENSITIVEThis is a modified FDA-approved test that has been validated and its performance characteristics determined by the reporting laboratory.  This laboratory is certified under the Clinical Laboratory Improvement Amendments CLIA as qualified to perform high complexity clinical laboratory testing.    * >=100,000 COLONIES/mL PROTEUS MIRABILIS    Please note: You were cared for by a hospitalist during your hospital stay. Once you are discharged, your primary care physician will handle any further medical issues. Please note that NO REFILLS for any discharge medications will be authorized once you are discharged, as it is imperative that you return to your primary care physician (or establish a relationship with a primary care physician if you do not  have one) for your post hospital discharge needs so that they can reassess your need for medications and monitor your lab values.    Time coordinating discharge: 40 minutes  SIGNED:   Ivonne Mustache, MD  [facility] 06/11/2024, 11:56 AM Pager [phone]  If 7PM-7AM, please contact night-coverage [email or web address] Password [email or web address]
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
## 2024-06-12 DIAGNOSIS — E860 Dehydration: Secondary | ICD-10-CM | POA: Diagnosis not present

## 2024-06-12 MED ORDER — ORAL CARE MOUTH RINSE: 15.0000 mL | OROMUCOSAL | Status: DC | PRN

## 2024-06-12 MED ORDER — IPRATROPIUM-ALBUTEROL 0.5-2.5 (3) MG/3ML IN SOLN: 3.0000 mL | Freq: Two times a day (BID) | RESPIRATORY_TRACT | Status: DC

## 2024-06-12 MED ORDER — ALBUTEROL SULFATE (2.5 MG/3ML) 0.083% IN NEBU: 2.5000 mg | INHALATION_SOLUTION | Freq: Four times a day (QID) | RESPIRATORY_TRACT | Status: DC | PRN

## 2024-06-12 NOTE — Progress Notes (Signed)
 Called report to Nurse Saneied at [facility] , patient leaving via [facility] now in no acute distress.

## 2024-06-12 NOTE — Progress Notes (Signed)
 Physical Therapy Treatment Patient Details Name: CATERRA OSTROFF MRN: [id number] DOB: July 19, 1940 Today's Date: 06/12/2024   History of Present Illness Patient is an 83 y/o female admitted 06/04/24 due to nausea, vomiting, diarrhea and poor p.o intake.  Found to have positive urinalysis and weakness/FTT.  PMH positive for CKD III (absence of one kidney), depression, HTN, GERD, PVD, PMR, sleep apnea, glaucoma, PNA, lung cancer.    PT Comments  Per chart review, barrier to D/C due to insurance.  Pt is NOT  Custodial Care  per TOC note per INSURANCE DENIAL.   AxO x 3 pleasant Lady following all commands. Pt was living home alone.  She was able to get herself in/OOB as well as self transfer on/off toilet.  She did use a wheelchair to scoot around the house but she was also able static stand long enough to meal prep, wash dishes and self dress. Pt's goals are to regain her prior level of mobility/transfers, ADLS's and return home.   Today, Pt required much assist.  General bed mobility comments: Pt required Mod/Max Assist to transition from supine, HOB elevated to sitting EOB with grestest difficulty self scooting due to increased RIGHT hip pain. General transfer comment: Pt required + 2 Max Assist to rise from elevated bed as well as Max Assist + 2 to complete 1/4 pivot turn to recliner due to increased RIGHT hip pain and weakness. Positioned in recliner to comfort.    LPT has rec Pt will need ST Rehab at SNF to address mobility and functional decline prior to safely returning home.    If plan is discharge home, recommend the following: Two people to help with walking and/or transfers;Help with stairs or ramp for entrance;Assistance with cooking/housework;A lot of help with bathing/dressing/bathroom   Can travel by private vehicle     No  Equipment Recommendations  None recommended by PT    Recommendations for Other Services       Precautions / Restrictions Precautions Precautions:  Fall Precaution/Restrictions Comments: R hip Arthritis (needs a THR) Restrictions Weight Bearing Restrictions Per Provider Order: No Other Position/Activity Restrictions: WBAT     Mobility  Bed Mobility Overal bed mobility: Needs Assistance Bed Mobility: Supine to Sit     Supine to sit: Mod assist, Max assist, +2 for physical assistance     General bed mobility comments: Pt required Mod/Max Assist to transition from supine, HOB elevated to sitting EOB with grestest difficulty self scooting due to increased RIGHT hip pain.    Transfers Overall transfer level: Needs assistance   Transfers: Sit to/from Stand Sit to Stand: Max assist, From elevated surface Stand pivot transfers: Max assist, +2 physical assistance, +2 safety/equipment         General transfer comment: Pt required + 2 Max Assist to rise from elevated bed as well as Max Assist + 2 to complete 1/4 pivot turn to recliner due to increased RIGHT hip pain and weakness.    Ambulation/Gait               General Gait Details: Transfer only   Stairs             Wheelchair Mobility     Tilt Bed    Modified Rankin (Stroke Patients Only)       Balance  Communication Communication Communication: No apparent difficulties  Cognition Arousal: Alert Behavior During Therapy: WFL for tasks assessed/performed   PT - Cognitive impairments: No apparent impairments                       PT - Cognition Comments: AxO x 3 pleasant Lady following all commands. Was living home alone.  She was able to get herself in/OOB as well as self transfer on/off toilet.  She did use a wheelchair to scoot around the house but she was also able static stand long enough to meal prep, wash dishes and self dress. Following commands: Intact Following commands impaired: Follows one step commands with increased time    Cueing Cueing Techniques: Verbal cues   Exercises      General Comments        Pertinent Vitals/Pain Pain Assessment Pain Assessment: 0-10 Pain Score: 8  Faces Pain Scale: Hurts whole lot Pain Location: R hip with mobility Pain Descriptors / Indicators: Aching, Grimacing, Guarding Pain Intervention(s): Premedicated before session, Repositioned, Patient requesting pain meds-RN notified    Home Living                          Prior Function            PT Goals (current goals can now be found in the care plan section) Progress towards PT goals: Progressing toward goals    Frequency    Min 2X/week      PT Plan      Co-evaluation              AM-PAC PT 6 Clicks Mobility   Outcome Measure  Help needed turning from your back to your side while in a flat bed without using bedrails?: A Lot Help needed moving from lying on your back to sitting on the side of a flat bed without using bedrails?: A Lot Help needed moving to and from a bed to a chair (including a wheelchair)?: A Lot Help needed standing up from a chair using your arms (e.g., wheelchair or bedside chair)?: A Lot Help needed to walk in hospital room?: Total Help needed climbing 3-5 steps with a railing? : Total 6 Click Score: 10    End of Session Equipment Utilized During Treatment: Gait belt Activity Tolerance: Patient limited by fatigue Patient left: in chair;with call bell/phone within reach Nurse Communication: Mobility status PT Visit Diagnosis: Other abnormalities of gait and mobility (R26.89);History of falling (Z91.81)     Time: 8863-8843 PT Time Calculation (min) (ACUTE ONLY): 20 min  Charges:    $Therapeutic Activity: 8-22 mins PT General Charges $$ ACUTE PT VISIT: 1 Visit                     Katheryn Leap  PTA Acute  Rehabilitation Services Office M-F          [phone]

## 2024-06-12 NOTE — Progress Notes (Signed)
 [facility] Progress Note  Patient: [name] [name]     FMW:[id number]  DOA: 06/04/2024   PCP: [name], DO       Brief hospital course: Patient is a 83 year old female with history of CKD stage IIIa, congenital absence of 1 kidney, hypertension, hyperlipidemia, recently diagnosed stage I lung adenocarcinoma status post SBRT, COPD on 2 L of oxygen at home intermittently who presented with complaint of nausea, vomiting, diarrhea, poor oral intake for past week, progressive weakness, difficulty with ambulation, crampy abdominal discomfort, unable to tolerate food, dysuria.  On presentation, she was hemodynamically stable.  Lab work showed creatinine of 1.7.  UA suggestive  of UTI.  Started on antibiotic, IV fluid, urine culture showed Proteus.  She finished the course of antibiotic.   Subjective:  She has no complaints.   Assessment and Plan: Active Problems:   AKI (acute kidney injury) - secondary to vomiting, diarrhea, abdominal pain - GI symptoms resolved. IVF given and creatinine normalized.   UTI, proteus  - treated x 5 days  R hip pain - has severe osteoarthritis and gluteus minimum tear - has been having significant difficulty with ambulating - ortho recommends outpatient follow up for surgery - she will need to see Dr Cordella Rhein [facility] Orthopedic specialists) in the office to discuss plans for surgery  Disposition  - SNF on dc. A DC summary was done on 12/3 by Dr Jillian. Please refer to this.     Code Status: Do not attempt resuscitation (DNR) PRE-ARREST INTERVENTIONS DESIRED Total time on patient care: 25 min DVT prophylaxis:  enoxaparin  (LOVENOX ) injection 40 mg Start: 06/08/24 2200    Objective:   Vitals:   06/11/24 2039 06/12/24 0506 06/12/24 0804 06/12/24 0806  BP: (!) 106/54 (!) 140/53    Pulse: 85 69    Resp: 18 18    Temp: 97.9 F (36.6 C) 98.1 F (36.7 C)    TempSrc:  Oral    SpO2: 97% 99% 98% 98%  Weight:      Height:        Filed Weights   06/04/24 1156  Weight: 64 kg   Exam: General exam: Appears comfortable  HEENT: oral mucosa moist Respiratory system: Clear to auscultation.  Cardiovascular system: S1 & S2 heard  Gastrointestinal system: Abdomen soft, non-tender, nondistended. Normal bowel sounds   Extremities: No cyanosis, clubbing or edema Psychiatry:  Mood & affect appropriate.    CBC: Recent Labs  Lab 06/08/24 0607 06/11/24 0529  WBC 4.1 6.2  HGB 11.2* 11.2*  HCT 35.0* 32.8*  MCV 115.1* 109.0*  PLT 113* 123*   Basic Metabolic Panel: Recent Labs  Lab 06/06/24 0512 06/08/24 0607 06/11/24 0529  NA 140 139 137  K 4.6 5.1 5.1  CL 111 107 97*  CO2 21* 24 33*  GLUCOSE 91 112* 101*  BUN 18 16 31*  CREATININE 1.29* 0.81 0.91  CALCIUM  8.3* 9.5 8.8*     Scheduled Meds:  busPIRone   10 mg Oral TID   citalopram   20 mg Oral QHS   clopidogrel   75 mg Oral Daily   divalproex   250 mg Oral BID   enoxaparin  (LOVENOX ) injection  40 mg Subcutaneous Q24H   gabapentin   200 mg Oral TID   ipratropium-albuterol   3 mL Nebulization BID   lidocaine   1 patch Transdermal Q24H   pantoprazole   40 mg Oral BID   umeclidinium-vilanterol  1 puff Inhalation Daily    Imaging and lab data personally reviewed  Author: Tayon Parekh  06/12/2024 11:27 AM  To contact [facility]>   Check the care team in [facility] and look for the attending/consulting [facility] provider listed  Log into [email or web address] and use [place]'s universal password   Go to> [facility]  and find provider  If you still have difficulty reaching the provider, please page the [facility] (Director on Call) for the Hospitalists listed on amion

## 2024-06-12 NOTE — Progress Notes (Addendum)
 Dr. Roselyn was able to see the patient yesterday.  We discussed the possibly of an outpatient hip replacement.  At this point she can transfer to rehab versus SNF to follow-up with Dr. Maryella as an outpatient  Cordella Rhein, MD, MS [facility] Specialist / [place] [phone]

## 2024-06-12 NOTE — Plan of Care (Signed)
   Problem: Education: Goal: Knowledge of General Education information will improve Description: Including pain rating scale, medication(s)/side effects and non-pharmacologic comfort measures Outcome: Progressing   Problem: Activity: Goal: Risk for activity intolerance will decrease Outcome: Progressing

## 2024-06-12 NOTE — TOC Progression Note (Addendum)
 Transition of Care [facility] - Progression Note    Patient Details  Name: [name] [name] MRN: [id number] Date of Birth: [birth year]  [facility] CM/SW Contact  [name], RN Phone [phone]  06/12/2024, 8:33 AM  Clinical Narrative:    Message has been left for Jori Daring with [employer] for update on insurance auth. Awaiting return call.   1105 CM received call from Tammy with [employer] stating that Insurance is offering peer to peer. Medical director feels like the patient is more custodial. For Peer to peer please call Dr. Elspeth Sar [phone] by 5:00pm today. Message has been sent to MD for follow up.   1247 CM following up on peer to peer for auth information. There is no answer. Voicemail has been left.   [address] Insurance shara has been approved. SNF auth for 7 days auth # [id number] ambulance auth approved # [id number]. Discharge summary has been faxed to facility.     Expected Discharge Plan: Skilled Nursing Facility Barriers to Discharge: Continued Medical Work up               Expected Discharge Plan and Services   Discharge Planning Services: CM Consult Post Acute Care Choice: Skilled Nursing Facility Living arrangements for the past 2 months: Single Family Home                                       Social Drivers of Health (SDOH) Interventions SDOH Screenings   Food Insecurity: Food Insecurity Present (06/04/2024)  Housing: High Risk (06/04/2024)  Transportation Needs: No Transportation Needs (06/04/2024)  Utilities: Not At Risk (06/04/2024)  Alcohol  Screen: Low Risk  (10/13/2022)  Depression (PHQ2-9): Medium Risk (05/20/2024)  Financial Resource Strain: Low Risk  (10/13/2022)  Physical Activity: Insufficiently Active (10/13/2022)  Social Connections: Moderately Integrated (06/04/2024)  Stress: Stress Concern Present (10/13/2022)  Tobacco Use: Medium Risk (06/04/2024)    Readmission Risk Interventions    06/08/2023    1:37 PM 07/26/2022    12:20 PM 05/09/2022    3:18 PM  Readmission Risk Prevention Plan  Post Dischage Appt  Complete Complete  Medication Screening  Complete Complete  Transportation Screening Complete Complete   PCP or Specialist Appt within 5-7 Days Complete    Home Care Screening Complete    Medication Review (RN CM) Complete

## 2024-06-12 NOTE — TOC Transition Note (Signed)
 Transition of Care [facility]) - Discharge Note   Patient Details  Name: [name] [name] MRN: [id number] Date of Birth: Sep 20, 1940  Transition of Care [facility]) CM/SW Contact:  [name], RN Phone Number:[phone]  06/12/2024, 2:01 PM   Clinical Narrative:    Insurance shara has been approved [employer] updated and clarified that patient is ok to Admit to facility. Transfer report and discharge summary faxed to facility. Darden Royden Finder updated on discharge. Transportation has been arranged per [facility].  RN please call report to [facility]  [phone] RM #[phone]. No other needs. Inpatient care manager will sign off.    Final next level of care: Skilled Nursing Facility Barriers to Discharge: Continued Medical Work up   Patient Goals and CMS Choice Patient states their goals for this hospitalization and ongoing recovery are:: Rehab CMS Medicare.gov Compare Post Acute Care list provided to:: Patient Choice offered to / list presented to : Patient [place] ownership interest in [facility].provided to:: Patient    Discharge Placement                       Discharge Plan and Services Additional resources added to the After Visit Summary for     Discharge Planning Services: CM Consult Post Acute Care Choice: Skilled Nursing Facility                               Social Drivers of Health (SDOH) Interventions SDOH Screenings   Food Insecurity: Food Insecurity Present (06/04/2024)  Housing: High Risk (06/04/2024)  Transportation Needs: No Transportation Needs (06/04/2024)  Utilities: Not At Risk (06/04/2024)  Alcohol  Screen: Low Risk  (10/13/2022)  Depression (PHQ2-9): Medium Risk (05/20/2024)  Financial Resource Strain: Low Risk  (10/13/2022)  Physical Activity: Insufficiently Active (10/13/2022)  Social Connections: Moderately Integrated (06/04/2024)  Stress: Stress Concern Present (10/13/2022)  Tobacco Use: Medium Risk (06/04/2024)     Readmission Risk  Interventions    06/08/2023    1:37 PM 07/26/2022   12:20 PM 05/09/2022    3:18 PM  Readmission Risk Prevention Plan  Post Dischage Appt  Complete Complete  Medication Screening  Complete Complete  Transportation Screening Complete Complete   PCP or Specialist Appt within 5-7 Days Complete    Home Care Screening Complete    Medication Review (RN CM) Complete

## 2024-06-24 ENCOUNTER — Other Ambulatory Visit: Payer: Self-pay

## 2024-06-24 DIAGNOSIS — I739 Peripheral vascular disease, unspecified: Secondary | ICD-10-CM

## 2024-06-24 MED ORDER — CLOPIDOGREL BISULFATE 75 MG PO TABS: 75.0000 mg | ORAL_TABLET | Freq: Every day | ORAL | 3 refills | Status: AC

## 2024-07-14 ENCOUNTER — Encounter

## 2024-07-15 ENCOUNTER — Ambulatory Visit
Admission: RE | Admit: 2024-07-15 | Discharge: 2024-07-15 | Disposition: A | Discharge status: Home / Self Care | Source: Ambulatory Visit

## 2024-07-15 ENCOUNTER — Telehealth: Payer: Self-pay

## 2024-07-15 DIAGNOSIS — N644 Mastodynia: Secondary | ICD-10-CM

## 2024-07-15 DIAGNOSIS — I739 Peripheral vascular disease, unspecified: Secondary | ICD-10-CM

## 2024-07-15 DIAGNOSIS — Z9189 Other specified personal risk factors, not elsewhere classified: Secondary | ICD-10-CM

## 2024-07-15 NOTE — Telephone Encounter (Signed)
 Copy made for batch scanning.   Placed in fax pile.

## 2024-07-15 NOTE — Telephone Encounter (Signed)
 Patient calls nurse line in regards to surgical clearance.   She reports she fell over the summer and fell again in the fall. She reports she now needs hip surgery.   She reports her surgeon faxed a clearance for PCP, however they have not received a response.   She reports she is unsure who her surgeon is or where he is located.   Advised will forward to PCP.

## 2024-07-15 NOTE — Telephone Encounter (Signed)
 Paperwork completed Referral to cardiology sent for surgical risk stratification   Damien Pinal, DO [facility], PGY-3 07/15/2024 10:36 AM

## 2024-07-16 ENCOUNTER — Ambulatory Visit: Payer: Self-pay | Admitting: Student

## 2024-07-16 NOTE — Telephone Encounter (Signed)
 Called to discuss normal mammogram and ultrasound results with patient.  All questions answered.   Dr. Cleotilde

## 2024-07-25 ENCOUNTER — Other Ambulatory Visit: Payer: Self-pay

## 2024-07-25 DIAGNOSIS — K219 Gastro-esophageal reflux disease without esophagitis: Secondary | ICD-10-CM

## 2024-07-25 MED ORDER — PANTOPRAZOLE SODIUM 40 MG PO TBEC: 40.0000 mg | DELAYED_RELEASE_TABLET | Freq: Two times a day (BID) | ORAL | 1 refills | Status: AC

## 2024-08-04 ENCOUNTER — Ambulatory Visit: Admitting: Cardiovascular Disease

## 2024-08-11 ENCOUNTER — Ambulatory Visit: Admitting: Cardiovascular Disease

## 2024-08-20 ENCOUNTER — Ambulatory Visit: Admitting: Cardiovascular Disease

## 2024-09-08 ENCOUNTER — Encounter

## 2024-10-20 ENCOUNTER — Inpatient Hospital Stay

## 2024-10-27 ENCOUNTER — Inpatient Hospital Stay: Admitting: Internal Medicine
